# Patient Record
Sex: Male | Born: 1945 | Race: Black or African American | Hispanic: No | Smoking: Former smoker
Health system: Southern US, Community
[De-identification: ages and names within clinical notes are randomized; demographics above are authoritative.]

## PROBLEM LIST (undated history)

## (undated) DIAGNOSIS — R932 Abnormal findings on diagnostic imaging of liver and biliary tract: Secondary | ICD-10-CM

## (undated) DIAGNOSIS — S14125A Central cord syndrome at C5 level of cervical spinal cord, initial encounter: Secondary | ICD-10-CM

## (undated) DIAGNOSIS — R945 Abnormal results of liver function studies: Secondary | ICD-10-CM

## (undated) DIAGNOSIS — I2510 Atherosclerotic heart disease of native coronary artery without angina pectoris: Secondary | ICD-10-CM

## (undated) DIAGNOSIS — I7121 Aneurysm of the ascending aorta, without rupture: Secondary | ICD-10-CM

## (undated) DIAGNOSIS — I499 Cardiac arrhythmia, unspecified: Secondary | ICD-10-CM

## (undated) DIAGNOSIS — N138 Other obstructive and reflux uropathy: Secondary | ICD-10-CM

## (undated) DIAGNOSIS — I77810 Thoracic aortic ectasia: Secondary | ICD-10-CM

## (undated) DIAGNOSIS — I712 Thoracic aortic aneurysm, without rupture: Secondary | ICD-10-CM

## (undated) DIAGNOSIS — I872 Venous insufficiency (chronic) (peripheral): Secondary | ICD-10-CM

## (undated) DIAGNOSIS — N2889 Other specified disorders of kidney and ureter: Secondary | ICD-10-CM

## (undated) DIAGNOSIS — E785 Hyperlipidemia, unspecified: Secondary | ICD-10-CM

## (undated) DIAGNOSIS — I119 Hypertensive heart disease without heart failure: Secondary | ICD-10-CM

## (undated) DIAGNOSIS — N401 Enlarged prostate with lower urinary tract symptoms: Secondary | ICD-10-CM

## (undated) DIAGNOSIS — J189 Pneumonia, unspecified organism: Secondary | ICD-10-CM

## (undated) DIAGNOSIS — J45909 Unspecified asthma, uncomplicated: Secondary | ICD-10-CM

## (undated) DIAGNOSIS — I351 Nonrheumatic aortic (valve) insufficiency: Secondary | ICD-10-CM

## (undated) DIAGNOSIS — R001 Bradycardia, unspecified: Secondary | ICD-10-CM

## (undated) DIAGNOSIS — I739 Peripheral vascular disease, unspecified: Secondary | ICD-10-CM

## (undated) DIAGNOSIS — I10 Essential (primary) hypertension: Secondary | ICD-10-CM

## (undated) DIAGNOSIS — F419 Anxiety disorder, unspecified: Secondary | ICD-10-CM

## (undated) DIAGNOSIS — R7989 Other specified abnormal findings of blood chemistry: Secondary | ICD-10-CM

## (undated) DIAGNOSIS — J449 Chronic obstructive pulmonary disease, unspecified: Secondary | ICD-10-CM

## (undated) DIAGNOSIS — D649 Anemia, unspecified: Secondary | ICD-10-CM

## (undated) DIAGNOSIS — G4730 Sleep apnea, unspecified: Secondary | ICD-10-CM

## (undated) DIAGNOSIS — M4802 Spinal stenosis, cervical region: Secondary | ICD-10-CM

## (undated) DIAGNOSIS — C9000 Multiple myeloma not having achieved remission: Secondary | ICD-10-CM

## (undated) DIAGNOSIS — C61 Malignant neoplasm of prostate: Secondary | ICD-10-CM

## (undated) DIAGNOSIS — N183 Chronic kidney disease, stage 3 unspecified: Secondary | ICD-10-CM

## (undated) DIAGNOSIS — C189 Malignant neoplasm of colon, unspecified: Secondary | ICD-10-CM

## (undated) DIAGNOSIS — I5032 Chronic diastolic (congestive) heart failure: Secondary | ICD-10-CM

## (undated) DIAGNOSIS — I480 Paroxysmal atrial fibrillation: Secondary | ICD-10-CM

## (undated) DIAGNOSIS — Z87898 Personal history of other specified conditions: Secondary | ICD-10-CM

## (undated) DIAGNOSIS — E119 Type 2 diabetes mellitus without complications: Secondary | ICD-10-CM

## (undated) DIAGNOSIS — C649 Malignant neoplasm of unspecified kidney, except renal pelvis: Secondary | ICD-10-CM

## (undated) HISTORY — DX: Malignant neoplasm of unspecified kidney, except renal pelvis: C64.9

## (undated) HISTORY — DX: Bradycardia, unspecified: R00.1

## (undated) HISTORY — DX: Nonrheumatic aortic (valve) insufficiency: I35.1

## (undated) HISTORY — DX: Chronic diastolic (congestive) heart failure: I50.32

## (undated) HISTORY — DX: Other specified abnormal findings of blood chemistry: R79.89

## (undated) HISTORY — DX: Other obstructive and reflux uropathy: N40.1

## (undated) HISTORY — PX: DIAGNOSTIC LAPAROSCOPY: SUR761

## (undated) HISTORY — DX: Benign prostatic hyperplasia with lower urinary tract symptoms: N13.8

## (undated) HISTORY — DX: Hyperlipidemia, unspecified: E78.5

## (undated) HISTORY — DX: Chronic obstructive pulmonary disease, unspecified: J44.9

## (undated) HISTORY — DX: Essential (primary) hypertension: I10

## (undated) HISTORY — DX: Abnormal results of liver function studies: R94.5

## (undated) HISTORY — DX: Type 2 diabetes mellitus without complications: E11.9

## (undated) HISTORY — DX: Hypertensive heart disease without heart failure: I11.9

## (undated) HISTORY — PX: BACK SURGERY: SHX140

---

## 1972-09-02 HISTORY — PX: LUMBAR DISC SURGERY: SHX700

## 1998-01-25 ENCOUNTER — Inpatient Hospital Stay (HOSPITAL_COMMUNITY): Admission: EM | Admit: 1998-01-25 | Discharge: 1998-01-27 | Payer: Self-pay | Admitting: Emergency Medicine

## 1998-06-15 ENCOUNTER — Encounter: Admission: RE | Admit: 1998-06-15 | Discharge: 1998-06-15 | Payer: Self-pay | Admitting: *Deleted

## 1998-07-21 ENCOUNTER — Encounter: Payer: Self-pay | Admitting: Family Medicine

## 1998-07-21 ENCOUNTER — Ambulatory Visit (HOSPITAL_COMMUNITY): Admission: RE | Admit: 1998-07-21 | Discharge: 1998-07-21 | Payer: Self-pay | Admitting: Family Medicine

## 1998-08-07 ENCOUNTER — Encounter: Admission: RE | Admit: 1998-08-07 | Discharge: 1998-08-07 | Payer: Self-pay | Admitting: Family Medicine

## 1998-08-11 ENCOUNTER — Encounter: Payer: Self-pay | Admitting: Emergency Medicine

## 1998-08-11 ENCOUNTER — Emergency Department (HOSPITAL_COMMUNITY): Admission: EM | Admit: 1998-08-11 | Discharge: 1998-08-11 | Payer: Self-pay | Admitting: Emergency Medicine

## 1998-11-23 ENCOUNTER — Encounter: Admission: RE | Admit: 1998-11-23 | Discharge: 1998-11-23 | Payer: Self-pay | Admitting: Family Medicine

## 1999-08-04 ENCOUNTER — Emergency Department (HOSPITAL_COMMUNITY): Admission: EM | Admit: 1999-08-04 | Discharge: 1999-08-04 | Payer: Self-pay | Admitting: *Deleted

## 2000-09-23 ENCOUNTER — Ambulatory Visit (HOSPITAL_COMMUNITY): Admission: RE | Admit: 2000-09-23 | Discharge: 2000-09-23 | Payer: Self-pay | Admitting: Family Medicine

## 2001-06-21 ENCOUNTER — Inpatient Hospital Stay (HOSPITAL_COMMUNITY): Admission: EM | Admit: 2001-06-21 | Discharge: 2001-06-24 | Payer: Self-pay | Admitting: Emergency Medicine

## 2001-06-21 ENCOUNTER — Encounter: Payer: Self-pay | Admitting: Emergency Medicine

## 2002-05-31 ENCOUNTER — Encounter: Admission: RE | Admit: 2002-05-31 | Discharge: 2002-05-31 | Payer: Self-pay | Admitting: Cardiology

## 2002-05-31 ENCOUNTER — Encounter: Payer: Self-pay | Admitting: Cardiology

## 2002-06-14 ENCOUNTER — Ambulatory Visit (HOSPITAL_COMMUNITY): Admission: RE | Admit: 2002-06-14 | Discharge: 2002-06-14 | Payer: Self-pay | Admitting: Cardiology

## 2002-06-21 ENCOUNTER — Ambulatory Visit (HOSPITAL_COMMUNITY): Admission: RE | Admit: 2002-06-21 | Discharge: 2002-06-21 | Payer: Self-pay | Admitting: Cardiology

## 2002-06-21 ENCOUNTER — Encounter: Payer: Self-pay | Admitting: Cardiology

## 2002-12-24 ENCOUNTER — Encounter: Payer: Self-pay | Admitting: Cardiology

## 2002-12-24 ENCOUNTER — Encounter: Admission: RE | Admit: 2002-12-24 | Discharge: 2002-12-24 | Payer: Self-pay | Admitting: Cardiology

## 2004-05-26 ENCOUNTER — Emergency Department (HOSPITAL_COMMUNITY): Admission: EM | Admit: 2004-05-26 | Discharge: 2004-05-26 | Payer: Self-pay | Admitting: Emergency Medicine

## 2004-08-25 ENCOUNTER — Emergency Department (HOSPITAL_COMMUNITY): Admission: EM | Admit: 2004-08-25 | Discharge: 2004-08-26 | Payer: Self-pay

## 2005-07-05 ENCOUNTER — Emergency Department (HOSPITAL_COMMUNITY): Admission: EM | Admit: 2005-07-05 | Discharge: 2005-07-05 | Payer: Self-pay | Admitting: Emergency Medicine

## 2005-07-09 ENCOUNTER — Ambulatory Visit (HOSPITAL_COMMUNITY): Admission: RE | Admit: 2005-07-09 | Discharge: 2005-07-09 | Payer: Self-pay | Admitting: Internal Medicine

## 2005-07-12 ENCOUNTER — Emergency Department (HOSPITAL_COMMUNITY): Admission: EM | Admit: 2005-07-12 | Discharge: 2005-07-12 | Payer: Self-pay | Admitting: Emergency Medicine

## 2007-08-11 ENCOUNTER — Ambulatory Visit (HOSPITAL_COMMUNITY): Admission: RE | Admit: 2007-08-11 | Discharge: 2007-08-11 | Payer: Self-pay | Admitting: Internal Medicine

## 2007-09-08 ENCOUNTER — Emergency Department (HOSPITAL_COMMUNITY): Admission: EM | Admit: 2007-09-08 | Discharge: 2007-09-08 | Payer: Self-pay | Admitting: Emergency Medicine

## 2007-11-12 ENCOUNTER — Encounter: Admission: RE | Admit: 2007-11-12 | Discharge: 2007-11-12 | Payer: Self-pay | Admitting: Orthopedic Surgery

## 2007-12-03 ENCOUNTER — Encounter (INDEPENDENT_AMBULATORY_CARE_PROVIDER_SITE_OTHER): Payer: Self-pay | Admitting: Internal Medicine

## 2008-02-16 ENCOUNTER — Ambulatory Visit (HOSPITAL_COMMUNITY): Admission: RE | Admit: 2008-02-16 | Discharge: 2008-02-16 | Payer: Self-pay | Admitting: Family Medicine

## 2008-02-16 ENCOUNTER — Ambulatory Visit: Payer: Self-pay | Admitting: Vascular Surgery

## 2008-02-16 ENCOUNTER — Encounter (INDEPENDENT_AMBULATORY_CARE_PROVIDER_SITE_OTHER): Payer: Self-pay | Admitting: Family Medicine

## 2008-11-25 ENCOUNTER — Encounter: Admission: RE | Admit: 2008-11-25 | Discharge: 2008-11-25 | Payer: Self-pay | Admitting: Family Medicine

## 2009-04-20 ENCOUNTER — Encounter: Payer: Self-pay | Admitting: Gastroenterology

## 2009-11-02 ENCOUNTER — Encounter: Admission: RE | Admit: 2009-11-02 | Discharge: 2009-11-02 | Payer: Self-pay | Admitting: Family Medicine

## 2009-11-10 DIAGNOSIS — L308 Other specified dermatitis: Secondary | ICD-10-CM | POA: Insufficient documentation

## 2009-11-10 DIAGNOSIS — L299 Pruritus, unspecified: Secondary | ICD-10-CM | POA: Insufficient documentation

## 2010-01-30 ENCOUNTER — Encounter: Admission: RE | Admit: 2010-01-30 | Discharge: 2010-01-30 | Payer: Self-pay | Admitting: Family Medicine

## 2010-04-04 DIAGNOSIS — F411 Generalized anxiety disorder: Secondary | ICD-10-CM | POA: Insufficient documentation

## 2010-04-04 DIAGNOSIS — N2889 Other specified disorders of kidney and ureter: Secondary | ICD-10-CM

## 2010-04-04 HISTORY — DX: Other specified disorders of kidney and ureter: N28.89

## 2010-09-07 DIAGNOSIS — I10 Essential (primary) hypertension: Secondary | ICD-10-CM | POA: Insufficient documentation

## 2010-09-07 DIAGNOSIS — R7309 Other abnormal glucose: Secondary | ICD-10-CM | POA: Insufficient documentation

## 2010-09-09 DIAGNOSIS — E785 Hyperlipidemia, unspecified: Secondary | ICD-10-CM | POA: Insufficient documentation

## 2010-10-03 ENCOUNTER — Telehealth (INDEPENDENT_AMBULATORY_CARE_PROVIDER_SITE_OTHER): Payer: Self-pay | Admitting: Radiology

## 2010-10-03 ENCOUNTER — Other Ambulatory Visit: Payer: Self-pay

## 2010-10-04 ENCOUNTER — Other Ambulatory Visit (HOSPITAL_COMMUNITY): Payer: Self-pay

## 2010-10-04 ENCOUNTER — Telehealth (INDEPENDENT_AMBULATORY_CARE_PROVIDER_SITE_OTHER): Payer: Self-pay | Admitting: *Deleted

## 2010-10-09 ENCOUNTER — Other Ambulatory Visit (HOSPITAL_COMMUNITY): Payer: Self-pay

## 2010-10-10 NOTE — Progress Notes (Signed)
Summary: N/S stress test  Phone Note Outgoing Call Call back at [phone]   Call placed by: Eliezer Lofts, EMT-P,  October 04, 2010 2:03 PM Summary of Call: Patient NS for stress test appt. today. Unable to leave messsage due to 'mailbox is full'.  Home number listed is INCORRECT, person that answered advised there is no-one by that name. Eliezer Lofts, EMT-P  October 04, 2010 2:05 PM

## 2010-10-10 NOTE — Progress Notes (Signed)
Summary: Nuc Pre-Procedure  Phone Note Outgoing Call Call back at Home Phone [phone]   Call placed by: Anthony Sar Call placed to: Patient Reason for Call: Confirm/change Appt Summary of Call: Left message with information on My[employer] Information Sheet (see scanned document for details).      Nuclear Med Background Indications for Stress Test: Evaluation for Ischemia   History: COPD, Myocardial Perfusion Study  History Comments: 2003- Normal MPS.EF= 54%  Symptoms: Syncope    Nuclear Pre-Procedure Cardiac Risk Factors: Family History - CAD, Hypertension, Lipids, NIDDM

## 2010-10-18 ENCOUNTER — Ambulatory Visit (HOSPITAL_COMMUNITY): Payer: Medicaid Other | Attending: Cardiovascular Disease

## 2010-10-18 DIAGNOSIS — I079 Rheumatic tricuspid valve disease, unspecified: Secondary | ICD-10-CM | POA: Insufficient documentation

## 2010-10-18 DIAGNOSIS — E785 Hyperlipidemia, unspecified: Secondary | ICD-10-CM | POA: Insufficient documentation

## 2010-10-18 DIAGNOSIS — Z8249 Family history of ischemic heart disease and other diseases of the circulatory system: Secondary | ICD-10-CM | POA: Insufficient documentation

## 2010-10-18 DIAGNOSIS — I10 Essential (primary) hypertension: Secondary | ICD-10-CM | POA: Insufficient documentation

## 2010-10-18 DIAGNOSIS — I080 Rheumatic disorders of both mitral and aortic valves: Secondary | ICD-10-CM | POA: Insufficient documentation

## 2010-10-18 DIAGNOSIS — J449 Chronic obstructive pulmonary disease, unspecified: Secondary | ICD-10-CM | POA: Insufficient documentation

## 2010-10-18 DIAGNOSIS — R55 Syncope and collapse: Secondary | ICD-10-CM

## 2010-10-18 DIAGNOSIS — E119 Type 2 diabetes mellitus without complications: Secondary | ICD-10-CM | POA: Insufficient documentation

## 2010-10-18 DIAGNOSIS — J4489 Other specified chronic obstructive pulmonary disease: Secondary | ICD-10-CM | POA: Insufficient documentation

## 2010-10-29 ENCOUNTER — Telehealth (INDEPENDENT_AMBULATORY_CARE_PROVIDER_SITE_OTHER): Payer: Self-pay | Admitting: Radiology

## 2010-10-30 ENCOUNTER — Ambulatory Visit (HOSPITAL_COMMUNITY): Payer: Medicare Other

## 2010-10-30 DIAGNOSIS — R0989 Other specified symptoms and signs involving the circulatory and respiratory systems: Secondary | ICD-10-CM

## 2010-11-01 ENCOUNTER — Other Ambulatory Visit (HOSPITAL_COMMUNITY): Payer: Medicare Other

## 2010-11-06 ENCOUNTER — Encounter (INDEPENDENT_AMBULATORY_CARE_PROVIDER_SITE_OTHER): Payer: Self-pay | Admitting: *Deleted

## 2010-11-06 ENCOUNTER — Encounter: Payer: Self-pay | Admitting: Cardiology

## 2010-11-06 ENCOUNTER — Encounter: Payer: Self-pay | Admitting: *Deleted

## 2010-11-06 ENCOUNTER — Ambulatory Visit (HOSPITAL_COMMUNITY): Payer: Medicare Other | Attending: Cardiology

## 2010-11-06 DIAGNOSIS — R55 Syncope and collapse: Secondary | ICD-10-CM | POA: Insufficient documentation

## 2010-11-06 DIAGNOSIS — I4949 Other premature depolarization: Secondary | ICD-10-CM

## 2010-11-06 DIAGNOSIS — R0989 Other specified symptoms and signs involving the circulatory and respiratory systems: Secondary | ICD-10-CM

## 2010-11-06 DIAGNOSIS — R079 Chest pain, unspecified: Secondary | ICD-10-CM

## 2010-11-06 DIAGNOSIS — R9431 Abnormal electrocardiogram [ECG] [EKG]: Secondary | ICD-10-CM

## 2010-11-08 NOTE — Progress Notes (Signed)
Summary: Nuclear Pre-Procedure  Phone Note Outgoing Call Call back at [facility] Phone [phone]   Call placed by: Eliezer Lofts, EMT-P,  October 29, 2010 11:22 AM Call placed to: Patient Action Taken: Phone Call Completed Reason for Call: Confirm/change Appt Summary of Call: Left message with information on Myov[employer] Information Sheet (see scanned document for details). Eliezer Lofts, EMT-P  October 29, 2010 11:22 AM      Nuclear Med Background Indications for Stress Test: Evaluation for Ischemia   History: COPD, Myocardial Perfusion Study  History Comments: 2003- Normal MPS.EF= 54%  Symptoms: Syncope    Nuclear Pre-Procedure Cardiac Risk Factors: Family History - CAD, Hypertension, Lipids, NIDDM

## 2010-11-13 NOTE — Assessment & Plan Note (Signed)
Summary: Cardiology Nuclear Testing  Nuclear Med Background Indications for Stress Test: Evaluation for Ischemia   History: COPD, Myocardial Perfusion Study  History Comments: 2003- Normal MPS.EF= 54%  Symptoms: Syncope    Nuclear Pre-Procedure Cardiac Risk Factors: Family History - CAD, Hypertension, Lipids, NIDDM Caffeine/Decaff Intake: None NPO After: 4:00 AM Lungs: Clear IV 0.9% NS with Angio Cath: 20g     IV Site: R Antecubital IV Started by: Irven Baltimore, RN Chest Size (in) 44     Height (in): 69.5 Weight (lb): 236 BMI: 34.48  Nuclear Med Study 1 or 2 day study:  2 day     Stress Test Type:  Lexiscan Reading MD:  Darlin Coco, MD     Referring MD:  Mamie Nick Nahser Resting Radionuclide:  Technetium 71m Tetrofosmin     Resting Radionuclide Dose:  11 mCi  Stress Radionuclide:  Technetium 55m Tetrofosmin     Stress Radionuclide Dose:  33 mCi   Stress Protocol      Max HR:  77 bpm Max Systolic BP: Q000111Q mm HgRate Pressure Product:  [id number]  Lexiscan: 0.4 mg   Stress Test Technologist:  Irven Baltimore,  RN     Nuclear Technologist:  Charlton Amor, CNMT  Rest Procedure  Myocardial perfusion imaging was performed at rest 45 minutes following the intravenous administration of Technetium 56m Tetrofosmin.  Stress Procedure  The patient received IV Lexiscan 0.4 mg over 15-seconds.  Technetium 55m Tetrofosmin injected at 30-seconds.  The EKG was nondiagnostic due to baseline T-wave changes, frequent fusion beats, PVC's, PAC's, and > 1 second pauses.  Quantitative spect images were obtained after a 45 minute delay.  QPS Raw Data Images:  Normal; no motion artifact; normal heart/lung ratio. Stress Images:  Normal homogeneous uptake in all areas of the myocardium. Rest Images:  Normal homogeneous uptake in all areas of the myocardium. Subtraction (SDS):  No evidence of ischemia. Transient Ischemic Dilatation:  1.09  (Normal <1.22)  Lung/Heart Ratio:  .29  (Normal  <0.45)  Quantitative Gated Spect Images QGS EDV:  215 ml QGS ESV:  111 ml QGS EF:  48 % QGS cine images:  Enlarge LV cavity.  No segmental wall motion abnormalities  Findings Low risk nuclear study   Evidence for LV Dysfunction LV Dysfunction    Overall Impression  Exercise Capacity: Lexiscan with no exercise. BP Response: Normal blood pressure response. Clinical Symptoms: No chest pain ECG Impression: Baseline T wave inversion unchanged with stress. Overall Impression: Low risk stress nuclear study. Overall Impression Comments: No reversible ischemia seen.  Appended Document: Cardiology Nuclear Testing copy sent to Dr. Acie Fredrickson

## 2011-01-18 NOTE — Discharge Summary (Signed)
[place]  Patient:    [name], [name] Visit Number: [id number] MRN: [id number]          Service Type: MED Location: [id number] Attending Physician:  [name] [name] Dictated by:   [name] [name], MS4 Admit Date:  06/21/2001 Discharge Date: 06/24/2001   CC:         [name] [name], M.D.   Discharge Summary  DATE OF BIRTH:  02/20/1946  DISCHARGE DIAGNOSES: 1. Hypertension, poor control. 2. Congestive heart failure, ejection fraction 40 to 50%. 3. Mild left ventricular hypertrophy. 4. Moderate aortic regurgitation. 5. Elevated CK-MB. 6. Hypokalemia. 7. Sleep apnea. 8. Benign prostatic hypertrophy. 9. Normocytic anemia.  DISCHARGE MEDICATIONS: 1. Norvasc 10 mg p.o. q.d. 2. Lisinopril 10 mg p.o. q.d. 3. Hydrochlorothiazide 25 mg p.o. q.a.m. 4. Amitriptyline 25 mg p.o. q.h.s. 5. ASA 81 mg p.o. q.d. 6. Albuterol inhaler 2 puffs q.i.d. p.r.n.  DISPOSITION:  Stable.  FOLLOWUP: 1. The patient was to follow up with [facility] on June 09, 2001, at 8    a.m., at which time he is to [employer] recertification to help financially    support his medications. 2. Return to [name] on July 07, 2001, at 2:45 p.m. 3. Dr. Rollene [name] in one month.  CONSULTATIONS:  Dr. Rollene [name].  PROCEDURES PERFORMED: 1. Cardiac echocardiogram showing ejection fraction 40 to 50%, no wall motion    abnormalities, moderate AR, mild LA dilatation, moderate concentric left    ventricular hypertrophy.  Echocardiogram consistent with hypertensive heart    disease. 2. Chest x-ray on June 21, 2001, showed mild pulmonary vascular congestion    and cardiomegaly.  HISTORY OF PRESENT ILLNESS:  The patient is a 65 year old African-American male with a history of hypertension, comes to ED with elevated blood pressure to 207/130 with a pulse of 50, complains of blurry vision and episodes of dizziness, no headaches.  Says he ran out of blood pressure  medications.  ADMISSION LABORATORY DATA:  Sodium 137, potassium 2.9, chloride 104, bicarbonate 29, BUN 11, creatinine 0.9, glucose 116.  White blood cell count 7.3, hemoglobin 12.8, platelets 259.  PT 13, PTT 29.  Magnesium 1.9.  CK 872, CK-MB 32.9, troponin-I 0.01.  Urinalysis negative.  Chest x-ray see above. EKG showed sinus bradycardia, nonspecific ST-T changes, no acute changes.  HOSPITAL COURSE:  #1 - HYPERTENSION:  The patient has a chronic history of hypertension, and ran out of his klonodine 1-1/2 months ago.  This was due to financial constraint. He had been taking his wifes leftover medication since then sporadically. The patient has missed several [facility] appointments for unclear reasons, but he was rescheduled for these appointments, at which time he will be able to obtain his new regimen of antihypertensive medications at low cost.  He was begun on Norvasc, hydrochlorothiazide, and lisinopril in substitution for his klonodine.  He will be followed up in 1 to 2 weeks to determine the ethicacy of these medications, and check for any side effects.  It is also believed that his recent increase in hypertension contributed to his shortness of breath.  See next problem.  #2 -  CONGESTIVE HEART FAILURE:  The patient also was admitted complaining of increased shortness of breath, three pillow orthopnea for about 1 to 2 months, with increased right leg edema and sporadic episodes of heart palpitations and skipped beats.  He denied any chest pain or chest tightness, so he was diuresed aggressively upon admission with Lasix, and his  shortness of breath quickly resolved, as did his pedal edema.  An echocardiogram was obtained showing an ejection fraction of 40 to 50%.  The patient was started on a more optimal antihypertensive medication regimen which should prove beneficial for his condition.  Of note, due to his left ventricular hypertrophy and congestive heart failure, an ACE  inhibitor was started and should be continued due to its beneficial affects on mortality in this population.  #3 - ELEVATED CK-MB:  The patients CK-MB fraction was elevated, while the troponin remained negative throughout his stay.  It was also coupled with a highly elevated CK to around 900.  The patient had no evidence of any mucitis or rhabdomyolysis, and the reason for his elevated enzymes were believed to be due to a hypokalemic myopathy.  These numbers were followed throughout his course, and steadily decreased from initial high of CK 872 to 471 on discharge.  The CK-MB from a high of 32.9 to 15.2 on discharge.  The troponins remained at 0.01 throughout.  #4 - HYPOKALEMIA:  The patient presented with a 2.5 potassium level.  This was replaced throughout his course, and remained stable.  On discharge he had a potassium of 3.8.  #5 - SLEEP APNEA:  The patient complained of a history of sleep apnea confirmed with a sleep study.  No documentation for this was found in the past, and the patient does not currently use a CPAP machine.  This is likely contributing to his hypertension and/or pulmonary hypertension.  The patient was advised to follow up with his primary care physician who initially worked with him on this problem, and to see if he can obtain a CPAP nasal ______ at some point in the future.  #6 - BENIGN PROSTATIC HYPERTROPHY:  The patient described a history consistent with benign prostatic hypertrophy, including difficulty starting and stopping urination, dribbling, post micturition.  He had no symptoms of dysuria, but also complained of urgency and frequency.  His urinalysis was negative.  A PSA was checked to rule out prostate cancer, and this was negative as well.  On  physical examination, his prostate did seem enlarged, but was nontender.  Due to the number of medications the patient is already taken, and the expense of these, a herbal product, saw palmetto, which has  been shown to be effecatious in improving urine stream was recommended.  Follow up on this condition in the future.  He may need to begin medication such as Flomax if the saw palmetto is not effective.  #7 - NORMOCYTIC ANEMIA:  The hemoglobin and hematocrit was followed and found to be stable throughout.  This particular problem was not investigated thoroughly at this time.  The patient should follow up on this condition in the future.  He did, however, have heme negative stool on admission, however, it would be reasonable to Hemoccult followup stools and make sure the patient is properly screened for colon cancer in the future.  #8 - HYPERLIPIDEMIA:  The patient was found to have elevated LDL, as well as several other risk factors for coronary artery disease.  It would be advisable for the patient to begin a 3 to 6 month course of exercise and diet, and a followup LDL taken.  If this is not improved at that time, please consider statin drug with LFPs.  DISCHARGE LABORATORY DATA:  Sodium 139, potassium 3.8, chloride 103, bicarbonate 27, BUN 10, creatinine 1.2, glucose 92, hemoglobin 13.5, hematocrit 40.  White blood cell count 7.9, platelets  251.  PSA 2.50.  LDL 147, HDL 49, triglycerides 105.  BMP 43.7.  TSH 3.34, T3 uptake 40, free T4 1.14. Dictated by:   [name] [name], MS4 Attending Physician:  [name] [name] DD:  06/25/01 TD:  06/29/01 Job: [id number]

## 2011-01-18 NOTE — Procedures (Signed)
EEG NUMBER:  [id number]   INDICATION FOR STUDY:  The patient's EEG was requested by Dr. Nolene Ebbs,  who evaluated this 65 year old male for a possible seizure disorder.  The  patient had several episodes of loss of consciousness associated with tongue  bites.  He also has a diagnosis of sleep apnea.   TECHNICAL DESCRIPTION:  The patient was asked to perform photic stimulation  maneuvers.  Hyperventilation was deferred due to a history of cardiovascular  disease.  A medication list is not added to the request.  No H&P is  available.   A posterior dominant rhythm was seen over both posterior hemispheres at a  frequency of 9 Hz.  The study is so much marred by motion artifact that it  is almost uninterpretable.  There is evidence of EKG artifact, electrode  popping and disconnection as well as motion artifact.  Sleep is seen in the  form of sleep spindles associated with vertex sharp waves over the frontal,  parietal and anterior region and alternates with a drowsiness pattern, which  are characterized by theta range frequencies.  Also, I cannot detect any  epileptiform discharges for sure; it would be worth to repeat the study when  the patient is more cooperative and prepped to undergo an EEG.  To allow  optimal electrode placement, the skull has to be cleaned from any oily or  fatty residue and the patient should be placed in a cool space so that sweat  artifact does not hinder the electrode adherence.  Please consider a  repetition of the study to allow a better interpretation.   CONCLUSION:  No definite abnormality can be seen, but I cannot rule out that  abnormalities took place during the motion artifact prone part of the EEG;  it is therefore my recommendation to repeat the study.           ______________________________  Larey Seat, M.D.     [id number]  D:  07/09/2005 18:53:07  T:  07/10/2005 07:44:44  Job #:  [id number]   cc:   Nolene Ebbs, M.D.  Fax: [phone]

## 2011-04-11 DIAGNOSIS — B353 Tinea pedis: Secondary | ICD-10-CM | POA: Insufficient documentation

## 2011-04-11 DIAGNOSIS — J45909 Unspecified asthma, uncomplicated: Secondary | ICD-10-CM | POA: Insufficient documentation

## 2011-04-11 DIAGNOSIS — G4733 Obstructive sleep apnea (adult) (pediatric): Secondary | ICD-10-CM | POA: Insufficient documentation

## 2011-06-28 DIAGNOSIS — E876 Hypokalemia: Secondary | ICD-10-CM | POA: Insufficient documentation

## 2012-01-30 DIAGNOSIS — I878 Other specified disorders of veins: Secondary | ICD-10-CM | POA: Insufficient documentation

## 2012-02-10 ENCOUNTER — Other Ambulatory Visit: Payer: Self-pay | Admitting: Nurse Practitioner

## 2012-02-10 DIAGNOSIS — R972 Elevated prostate specific antigen [PSA]: Secondary | ICD-10-CM

## 2012-02-13 ENCOUNTER — Inpatient Hospital Stay: Admission: RE | Admit: 2012-02-13 | Payer: Medicare Other | Source: Ambulatory Visit

## 2012-02-18 ENCOUNTER — Inpatient Hospital Stay: Admission: RE | Admit: 2012-02-18 | Payer: Medicare Other | Source: Ambulatory Visit

## 2012-02-25 DIAGNOSIS — H9190 Unspecified hearing loss, unspecified ear: Secondary | ICD-10-CM | POA: Insufficient documentation

## 2012-07-23 DIAGNOSIS — I872 Venous insufficiency (chronic) (peripheral): Secondary | ICD-10-CM | POA: Insufficient documentation

## 2012-12-17 ENCOUNTER — Other Ambulatory Visit (HOSPITAL_COMMUNITY): Payer: Self-pay | Admitting: Interventional Radiology

## 2012-12-17 DIAGNOSIS — I83819 Varicose veins of unspecified lower extremities with pain: Secondary | ICD-10-CM

## 2013-01-05 ENCOUNTER — Other Ambulatory Visit: Payer: Medicare Other

## 2013-02-02 ENCOUNTER — Ambulatory Visit
Admission: RE | Admit: 2013-02-02 | Discharge: 2013-02-02 | Disposition: A | Discharge status: Home / Self Care | Payer: Medicare Other | Source: Ambulatory Visit | Attending: Interventional Radiology | Admitting: Interventional Radiology

## 2013-02-02 DIAGNOSIS — I83819 Varicose veins of unspecified lower extremities with pain: Secondary | ICD-10-CM

## 2013-04-20 ENCOUNTER — Telehealth: Payer: Self-pay | Admitting: Radiology

## 2013-04-20 ENCOUNTER — Other Ambulatory Visit (HOSPITAL_COMMUNITY): Payer: Self-pay | Admitting: Interventional Radiology

## 2013-04-20 DIAGNOSIS — I83893 Varicose veins of bilateral lower extremities with other complications: Secondary | ICD-10-CM

## 2013-04-20 NOTE — Telephone Encounter (Signed)
Left message requesting patient call for status update & to schedule 3 mo f/u Conservative Rx of Varicose Veins.  Cherylyn Sundby Riki Rusk, RN 04/20/2013 12:10 PM

## 2013-04-28 DIAGNOSIS — L0390 Cellulitis, unspecified: Secondary | ICD-10-CM | POA: Insufficient documentation

## 2013-05-12 ENCOUNTER — Other Ambulatory Visit (HOSPITAL_COMMUNITY): Payer: Self-pay | Admitting: Interventional Radiology

## 2013-05-12 DIAGNOSIS — I83893 Varicose veins of bilateral lower extremities with other complications: Secondary | ICD-10-CM

## 2013-05-13 ENCOUNTER — Inpatient Hospital Stay: Admission: RE | Admit: 2013-05-13 | Payer: Medicare Other | Source: Ambulatory Visit

## 2013-05-25 ENCOUNTER — Encounter (HOSPITAL_BASED_OUTPATIENT_CLINIC_OR_DEPARTMENT_OTHER): Payer: Medicare Other

## 2013-06-03 ENCOUNTER — Inpatient Hospital Stay: Admission: RE | Admit: 2013-06-03 | Payer: Medicare Other | Source: Ambulatory Visit

## 2013-06-22 ENCOUNTER — Other Ambulatory Visit: Payer: Medicare Other

## 2013-08-10 DIAGNOSIS — Z9114 Patient's other noncompliance with medication regimen: Secondary | ICD-10-CM | POA: Insufficient documentation

## 2013-11-23 ENCOUNTER — Emergency Department (INDEPENDENT_AMBULATORY_CARE_PROVIDER_SITE_OTHER)
Admission: EM | Admit: 2013-11-23 | Discharge: 2013-11-23 | Disposition: A | Discharge status: Home / Self Care | Payer: Medicare Other | Source: Home / Self Care | Attending: Emergency Medicine | Admitting: Emergency Medicine

## 2013-11-23 ENCOUNTER — Ambulatory Visit (HOSPITAL_COMMUNITY)
Admission: RE | Admit: 2013-11-23 | Discharge: 2013-11-23 | Disposition: A | Discharge status: Home / Self Care | Payer: Medicare Other | Source: Ambulatory Visit | Attending: Emergency Medicine | Admitting: Emergency Medicine

## 2013-11-23 ENCOUNTER — Encounter (HOSPITAL_COMMUNITY): Payer: Self-pay | Admitting: Emergency Medicine

## 2013-11-23 DIAGNOSIS — M79609 Pain in unspecified limb: Secondary | ICD-10-CM | POA: Insufficient documentation

## 2013-11-23 DIAGNOSIS — I8310 Varicose veins of unspecified lower extremity with inflammation: Secondary | ICD-10-CM

## 2013-11-23 DIAGNOSIS — I872 Venous insufficiency (chronic) (peripheral): Secondary | ICD-10-CM

## 2013-11-23 MED ORDER — TRAMADOL HCL 50 MG PO TABS: 100.0000 mg | ORAL_TABLET | Freq: Three times a day (TID) | ORAL | Status: DC | PRN

## 2013-11-23 MED ORDER — BETAMETHASONE DIPROPIONATE AUG 0.05 % EX CREA: TOPICAL_CREAM | Freq: Two times a day (BID) | CUTANEOUS | Status: DC

## 2013-11-23 MED ORDER — CEPHALEXIN 500 MG PO CAPS: 500.0000 mg | ORAL_CAPSULE | Freq: Three times a day (TID) | ORAL | Status: DC

## 2013-11-23 NOTE — ED Provider Notes (Signed)
Chief Complaint   Chief Complaint  Patient presents with  . Leg Pain    History of Present Illness   [name] [name] is a 68 year old male who has had chronic swelling of the left leg. He can feel a hard place" in his leg. It's tender to touch. It's not been draining any pus or oozing a clear fluid. The leg does itch. He describes pain from the calf down to the ankle. Also some pain and swelling over the dorsum of the foot. No pain or swelling above the knee. He's had no fever or chills, no shortness of breath or chest pain, no bowel swelling. No history of kidney or liver disease. No history of congestive heart failure or heart disease. No shortness of breath, PND, orthopnea. He does have hypertension elevated cholesterol.  Review of Systems   Other than as noted above, the patient denies any of the following symptoms: Systemic:  No fever, chills, sweats, weight gain or loss. Respiratory:  No coughing, wheezing, or shortness of breath. Cardiac:  No chest pain, tightness, pressure or syncope. GI:  No abdominal pain, swelling, distension, nausea, or vomiting. GU:  No dysuria, frequency, or hematuria. Ext:  No joint pain or muscle pain. Skin:  No rash or itching. Neuro:  No paresthesias or muscle weakness.  [place]   Past medical history, family history, social history, meds, and allergies were reviewed.  Currently takes Norvasc, Lipitor, Advair, and Klor-Con.  Physical Examination     Vital signs:  BP 133/77  Pulse 56  Temp(Src) 98.4 F (36.9 C) (Oral)  Resp 16  SpO2 100% Gen:  Alert, oriented, in no distress. Neck:  No tenderness, adenopathy, or JVD. Lungs:  Breath sounds clear and equal bilaterally.  No rales, rhonchi or wheezes. Heart:  Regular rhythm, no gallops or murmers. Abdomen:  Soft, nontender, no organomegaly or mass. Ext:  He has nonpitting edema of both legs. There are stasis changes with patches of scaling, but no ulceration. The dorsum of the foot in the entire  lower leg is tender to touch. Pulses were not felt. Neuro:  Alert and oriented times 3.  No muscle weakness.  Sensation intact to light touch. Skin:  Warm and dry.  No rash or skin lesions.  Course in Urgent [place]   The patient was sent to the noninvasive vascular lab for a venous duplex. This was negative for DVT.  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  He has venous stasis, lymphedema, and stasis dermatitis. He was instructed to elevate the leg, keep it wrapped, avoid scratching, and applying a moisturizing cream.  Plan   1.  Meds:  The following meds were prescribed:   Discharge Medication List as of 11/23/2013  2:47 PM    START taking these medications   Details  augmented betamethasone dipropionate (DIPROLENE AF) 0.05 % cream Apply topically 2 (two) times daily., Starting 11/23/2013, Until Discontinued, Normal    cephALEXin (KEFLEX) 500 MG capsule Take 1 capsule (500 mg total) by mouth 3 (three) times daily., Starting 11/23/2013, Until Discontinued, Normal    traMADol (ULTRAM) 50 MG tablet Take 2 tablets (100 mg total) by mouth every 8 (eight) hours as needed., Starting 11/23/2013, Until Discontinued, Normal        2.  Patient Education/Counseling:  The patient was given appropriate handouts, self care instructions, and instructed in symptomatic relief.  Followup with his primary care physician within the next 2 weeks.  3.  Follow up:  The  patient was told to follow up here if no better in 3 to 4 days, or sooner if becoming worse in any way, and given some red flag symptoms such as worsening swelling, chest pain, shortness of breath or fever which would prompt immediate return.       [name] Mo, MD 11/23/13 [phone]

## 2013-11-23 NOTE — Discharge Instructions (Signed)
Stasis Dermatitis Stasis dermatitis occurs when veins lose the ability to pump blood back to the heart (poor venous circulation). It causes a reddish-purple to brownish scaly, itchy rash on the legs. The rash comes from pooling of blood (stasis). CAUSES  This occurs because the veins do not work very well anymore or because pressure may be increased in the veins due to other conditions. With blood pooling, the increased pressure in the tiny blood vessels (capillaries) causes fluid to leak out of the capillaries into the tissue. The extra fluid makes it harder for the blood to feed the cells and get rid of waste products. SYMPTOMS  Stasis dermatitis appears as red, scaly, itchy patches on the legs. A yellowish or light brown discoloration is also present. Due to scratching or other injury, these patches can become an ulcer. This ulcer may remain for long periods of time. The ulcer can also become infected. Swelling of the legs is often present with stasis dermatitis. If the leg is swollen, this increases the risk of infection and further damage to the skin. Sometimes, intense itching, tingling, and burning occurs before signs of stasis dermatitis appear. You may find yourself scratching the insides of your ankles or rubbing your ankles together before the rash appears. After healing, there are often brown spots on the affected skin. DIAGNOSIS  Your caregiver makes this diagnosis based on an exam. Other tests may be done to better understand the cause. TREATMENT  If underlying conditions are present, they must be treated. Some of these conditions are heart failure, thyroid problems, poor nutrition, and varicose veins.  Cortisone creams and ointments applied to the skin (topically) may be needed, as well as medicine to reduce swelling in the legs (diuretics).  Compression stockings or an elastic wrap may also be needed to reduce swelling.  If there is an infection, antibiotic medicines may also be  used. HOME CARE INSTRUCTIONS   Try to rest and raise (elevate) the affected leg above the level of the heart, if possible.  Burow's solution wet packs applied for 30 minutes, 3 times daily, will help the weepy rash. Stop using the packs before your skin gets too dry. You can also use a mixture of 3 parts white vinegar to 1 quart water.  Grease your legs daily with ointments, such as petroleum jelly, to fight dryness.  Avoid scratching or injuring the area. SEEK IMMEDIATE MEDICAL CARE IF:   Your rash gets worse.  An ulcer forms.  You have an oral temperature above 102 F (38.9 C), not controlled by medicine.  You have any other severe symptoms. Document Released: 11/28/2005 Document Revised: 11/11/2011 Document Reviewed: 01/08/2010 [facility] Patient Information 2014 [place], [place].

## 2013-11-23 NOTE — Progress Notes (Signed)
Left lower extremity venous duplex completed.  Left:  No evidence of DVT, superficial thrombosis, or Baker's cyst.  Right:  Negative for DVT in the common femoral vein.  

## 2013-11-23 NOTE — ED Notes (Signed)
Patient complains of left leg pain x 2 months; States swelling and burning sensation with a hard place.

## 2014-04-05 DIAGNOSIS — R739 Hyperglycemia, unspecified: Secondary | ICD-10-CM | POA: Insufficient documentation

## 2014-04-07 ENCOUNTER — Other Ambulatory Visit: Payer: Self-pay | Admitting: Nurse Practitioner

## 2014-04-07 DIAGNOSIS — I878 Other specified disorders of veins: Secondary | ICD-10-CM

## 2014-04-11 ENCOUNTER — Encounter: Payer: Self-pay | Admitting: Radiology

## 2014-06-13 ENCOUNTER — Telehealth: Payer: Self-pay | Admitting: Cardiology

## 2014-06-13 NOTE — Telephone Encounter (Signed)
Received 17 pages of records from [facility] for new patient appointment with Dr Percival Spanish for 07/11/14  lp

## 2014-06-21 ENCOUNTER — Telehealth: Payer: Self-pay | Admitting: Cardiology

## 2014-06-21 NOTE — Telephone Encounter (Signed)
Attempted to call pt, phone number listed was not working

## 2014-06-21 NOTE — Telephone Encounter (Signed)
Pt called in stating that he is having some numbness in his fingers, and rapid heart beats and would like to know if could get in to be seen sooner. Please call  Thanks

## 2014-07-11 ENCOUNTER — Ambulatory Visit (INDEPENDENT_AMBULATORY_CARE_PROVIDER_SITE_OTHER): Payer: Medicare Other | Admitting: Cardiology

## 2014-07-11 ENCOUNTER — Encounter: Payer: Self-pay | Admitting: Cardiology

## 2014-07-11 VITALS — BP 138/84 | HR 63 | Ht 69.5 in | Wt 241.5 lb

## 2014-07-11 DIAGNOSIS — R002 Palpitations: Secondary | ICD-10-CM

## 2014-07-11 LAB — TSH: TSH: 1.518 u[IU]/mL (ref 0.350–4.500)

## 2014-07-11 NOTE — Progress Notes (Signed)
HPI The patient presents for evaluation of various complaints including palpitations.  He says he's had these for years. However, I see a telephone message but says these were increased. However, he's not really able to quantify or qualify these very much. He describes sensations of his heart beating hard. He denies however any sustained rapid rate. He does say it skips sometimes. There may be syncope but this is somewhat vague. He says he's had episodes where he is eating and then he seemed to lose consciousness though he doesn't fall to the floor. He describes at least one episode of being on his knees praying and perhaps passing out. However, this seems to be infrequent and he cannot provide significant details. He sleeps chronically on 3 pillows but he doesn't describe any new shortness of breath, PND or orthopnea. He doesn't have any new chest pressure, neck or arm discomfort. 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  No Known Allergies  Current Outpatient Prescriptions  Medication Sig Dispense Refill  . amLODipine (NORVASC) 10 MG tablet Take 10 mg by mouth 2 (two) times daily.     [name] augmented betamethasone dipropionate (DIPROLENE AF) 0.05 % cream Apply topically 2 (two) times daily. 50 g 5  . Fluticasone-Salmeterol (ADVAIR HFA IN) Inhale into the lungs.    [name] KLOR-CON M10 10 MEQ tablet Take 10 mEq by mouth daily.     [name] triamcinolone cream (KENALOG) 0.1 % Apply topically 2 (two) times daily.     No current facility-administered medications for this visit.    Past Medical History  Diagnosis Date  . Syncope   . Hypertension   . COPD (chronic obstructive pulmonary disease)   . Hyperlipidemia   . Diabetes mellitus type II   . Renal cell carcinoma     I do not see confirmation of this diagnosis.  He sees a urologist currently.      Past Surgical History  Procedure  Laterality Date  . Spinal fixation surgery  1974    Family History  Problem Relation Age of Onset  . Heart disease Mother     No details.  Still alive age 89  . Emphysema Father   . Asthma Father     History   Social History  . Marital Status: Married    Spouse Name: N/A    Number of Children: N/A  . Years of Education: N/A   Occupational History  . Not on file.   Social History Main Topics  . Smoking status: Never Smoker   . Smokeless tobacco: Not on file  . Alcohol Use: No  . Drug Use: Not on file  . Sexual Activity: Not on file   Other Topics Concern  . Not on file   Social History Narrative   Lives alone.  Wife died in 04/25/23.  Lives in apartment.      ROS:  As stated in the HPI and negative for all other systems.  PHYSICAL EXAM BP 138/84 mmHg  Pulse 63  Ht 5' 9.5" (1.765 m)  Wt 241 lb 8 oz (109.544 kg)  BMI 35.16 kg/m2  GENERAL:  Well appearing HEENT:  Pupils equal round and reactive, fundi not visualized, oral mucosa unremarkable, proptosis NECK:  No jugular venous distention, waveform within normal limits, carotid upstroke brisk and symmetric, no bruits, no thyromegaly LYMPHATICS:  No cervical, inguinal adenopathy LUNGS:  Clear to auscultation bilaterally BACK:  No CVA tenderness CHEST:  Unremarkable HEART:  PMI not displaced or sustained,S1 and S2 within normal limits, no S3, no S4, no clicks, no rubs, no murmurs ABD:  Flat, positive bowel sounds normal in frequency in pitch, no bruits, no rebound, no guarding, no midline pulsatile mass, no hepatomegaly, no splenomegaly EXT:  2 plus pulses throughout, no edema, no cyanosis no clubbing SKIN:  Positive rash on legs no nodules NEURO:  Cranial nerves II through XII grossly intact, motor grossly intact throughout PSYCH:  Cognitively intact, oriented to person place and time   EKG:  Sinus rhythm, left axis deviation, poor anterior R wave progression, premature ventricular contractions, lateral T-wave  inversion consider ischemia. no old EKG for comparison. 07/11/2014  ASSESSMENT AND PLAN  PALPITATIONS:  He has PVCs noted. I will check a TSH as review of the records sent to [place] do not indicate a recent TSH. I will also check a 24-hour Holter. Further evaluation will be based on these results.  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 I don't suspect ischemia based on the previous nuclear study. However, I will followup with an echocardiogram.  HTN:  His blood pressure is well controlled. She will continue the meds as listed.

## 2014-07-11 NOTE — Patient Instructions (Signed)
Your physician has requested that you have an echocardiogram. Echocardiography is a painless test that uses sound waves to create images of your heart. It provides your doctor with information about the size and shape of your heart and how well your heart's chambers and valves are working. This procedure takes approximately one hour. There are no restrictions for this procedure.  Your physician has recommended that you wear a holter monitor for 24 hours. Holter monitors are medical devices that record the heart's electrical activity. Doctors most often use these monitors to diagnose arrhythmias. Arrhythmias are problems with the speed or rhythm of the heartbeat. The monitor is a small, portable device. You can wear one while you do your normal daily activities. This is usually used to diagnose what is causing palpitations/syncope (passing out).  Your physician recommends that you return for lab work in: Today.  Your physician recommends that you schedule a follow-up appointment in: after testing with Dr. Percival Spanish.

## 2014-07-12 DIAGNOSIS — J309 Allergic rhinitis, unspecified: Secondary | ICD-10-CM | POA: Insufficient documentation

## 2014-07-12 DIAGNOSIS — M545 Low back pain, unspecified: Secondary | ICD-10-CM | POA: Insufficient documentation

## 2014-07-12 DIAGNOSIS — R972 Elevated prostate specific antigen [PSA]: Secondary | ICD-10-CM | POA: Insufficient documentation

## 2014-07-13 ENCOUNTER — Ambulatory Visit (HOSPITAL_COMMUNITY)
Admission: RE | Admit: 2014-07-13 | Discharge: 2014-07-13 | Disposition: A | Discharge status: Home / Self Care | Payer: Medicare Other | Source: Ambulatory Visit | Attending: Cardiovascular Disease | Admitting: Cardiovascular Disease

## 2014-07-13 ENCOUNTER — Emergency Department (HOSPITAL_COMMUNITY)
Admission: EM | Admit: 2014-07-13 | Discharge: 2014-07-13 | Disposition: A | Discharge status: Home / Self Care | Payer: Medicare Other | Attending: Emergency Medicine | Admitting: Emergency Medicine

## 2014-07-13 ENCOUNTER — Encounter (HOSPITAL_COMMUNITY): Payer: Self-pay | Admitting: Emergency Medicine

## 2014-07-13 ENCOUNTER — Emergency Department (HOSPITAL_COMMUNITY): Payer: Medicare Other

## 2014-07-13 DIAGNOSIS — E119 Type 2 diabetes mellitus without complications: Secondary | ICD-10-CM | POA: Insufficient documentation

## 2014-07-13 DIAGNOSIS — J320 Chronic maxillary sinusitis: Secondary | ICD-10-CM | POA: Insufficient documentation

## 2014-07-13 DIAGNOSIS — R51 Headache: Secondary | ICD-10-CM | POA: Insufficient documentation

## 2014-07-13 DIAGNOSIS — R42 Dizziness and giddiness: Secondary | ICD-10-CM | POA: Insufficient documentation

## 2014-07-13 DIAGNOSIS — R0981 Nasal congestion: Secondary | ICD-10-CM | POA: Diagnosis present

## 2014-07-13 DIAGNOSIS — J449 Chronic obstructive pulmonary disease, unspecified: Secondary | ICD-10-CM | POA: Diagnosis not present

## 2014-07-13 DIAGNOSIS — R519 Headache, unspecified: Secondary | ICD-10-CM

## 2014-07-13 DIAGNOSIS — I10 Essential (primary) hypertension: Secondary | ICD-10-CM | POA: Insufficient documentation

## 2014-07-13 DIAGNOSIS — I359 Nonrheumatic aortic valve disorder, unspecified: Secondary | ICD-10-CM | POA: Diagnosis present

## 2014-07-13 DIAGNOSIS — Z8553 Personal history of malignant neoplasm of renal pelvis: Secondary | ICD-10-CM | POA: Insufficient documentation

## 2014-07-13 DIAGNOSIS — Z79899 Other long term (current) drug therapy: Secondary | ICD-10-CM | POA: Diagnosis not present

## 2014-07-13 DIAGNOSIS — R002 Palpitations: Secondary | ICD-10-CM

## 2014-07-13 MED ORDER — CETIRIZINE HCL 10 MG PO CAPS: 10.0000 mg | ORAL_CAPSULE | Freq: Every day | ORAL | Status: DC

## 2014-07-13 MED ORDER — AMOXICILLIN 500 MG PO CAPS: 500.0000 mg | ORAL_CAPSULE | Freq: Three times a day (TID) | ORAL | Status: DC

## 2014-07-13 MED ORDER — FLUTICASONE PROPIONATE 50 MCG/ACT NA SUSP: NASAL | Status: DC

## 2014-07-13 MED ORDER — MECLIZINE HCL 25 MG PO TABS: 25.0000 mg | ORAL_TABLET | Freq: Three times a day (TID) | ORAL | Status: DC | PRN

## 2014-07-13 NOTE — ED Notes (Signed)
MD at bedside. 

## 2014-07-13 NOTE — Progress Notes (Signed)
2D Echo Performed 07/13/2014    Marygrace Drought, RCS

## 2014-07-13 NOTE — ED Provider Notes (Signed)
CSN: [id number]     Arrival date & time 07/13/14  1449 History   First MD Initiated Contact with Patient 07/13/14 1528     Chief Complaint  Patient presents with  . Nasal Congestion  . Cough      HPI  Patient presents for evaluation of "sinus infection" and dizziness. She's had facial pressure and sinus congestion and nasal congestion with nocturnal cough for several months. Worse over the last 2 night story feels like he "drowns" if he lives supine. He produces some "clear snotty" no blood. No fever. Over the last 48 hours had episodes where he stands and feels like things been around very briefly. No ongoing vertigo. Presents here. No additional CVA symptoms with any numbness or weakness. No headache per se, just facial pressure.  Past Medical History  Diagnosis Date  . Syncope   . Hypertension   . COPD (chronic obstructive pulmonary disease)   . Hyperlipidemia   . Diabetes mellitus type II   . Renal cell carcinoma     I do not see confirmation of this diagnosis.  He sees a urologist currently.     Past Surgical History  Procedure Laterality Date  . Spinal fixation surgery  1974   Family History  Problem Relation Age of Onset  . Heart disease Mother     No details.  Still alive age 25  . Emphysema Father   . Asthma Father    History  Substance Use Topics  . Smoking status: Never Smoker   . Smokeless tobacco: Not on file  . Alcohol Use: No    Review of Systems  Constitutional: Negative for fever, chills, diaphoresis, appetite change and fatigue.  HENT: Positive for congestion, rhinorrhea and sinus pressure. Negative for mouth sores, sore throat and trouble swallowing.   Eyes: Negative for visual disturbance.  Respiratory: Negative for cough, chest tightness, shortness of breath and wheezing.   Cardiovascular: Negative for chest pain.  Gastrointestinal: Negative for nausea, vomiting, abdominal pain, diarrhea and abdominal distention.  Endocrine: Negative for  polydipsia, polyphagia and polyuria.  Genitourinary: Negative for dysuria, frequency and hematuria.  Musculoskeletal: Negative for gait problem.  Skin: Negative for color change, pallor and rash.  Neurological: Positive for dizziness. Negative for syncope, light-headedness and headaches.  Hematological: Does not bruise/bleed easily.  Psychiatric/Behavioral: Negative for behavioral problems and confusion.      Allergies  Review of patient's allergies indicates no known allergies.  Home Medications   Prior to Admission medications   Medication Sig Start Date End Date Taking? Authorizing Provider  albuterol (PROVENTIL HFA;VENTOLIN HFA) 108 (90 BASE) MCG/ACT inhaler Inhale 2 puffs into the lungs every 4 (four) hours as needed for wheezing or shortness of breath.   Yes Historical Provider, MD  amLODipine (NORVASC) 10 MG tablet Take 10 mg by mouth 2 (two) times daily.  12/25/11  Yes Historical Provider, MD  Fluticasone-Salmeterol (ADVAIR HFA IN) Inhale into the lungs.   Yes Historical Provider, MD  KLOR-CON M10 10 MEQ tablet Take 10 mEq by mouth daily.  12/09/11  Yes Historical Provider, MD  amoxicillin (AMOXIL) 500 MG capsule Take 1 capsule (500 mg total) by mouth 3 (three) times daily. 07/13/14   Tanna Furry, MD  augmented betamethasone dipropionate (DIPROLENE AF) 0.05 % cream Apply topically 2 (two) times daily. 11/23/13   Harden Mo, MD  Cetirizine HCl (ZYRTEC ALLERGY) 10 MG CAPS Take 1 capsule (10 mg total) by mouth at bedtime. 07/13/14   Tanna Furry, MD  fluticasone (  FLONASE) 50 MCG/ACT nasal spray 1 spray each nares bid 07/13/14   Tanna Furry, MD  meclizine (ANTIVERT) 25 MG tablet Take 1 tablet (25 mg total) by mouth 3 (three) times daily as needed for dizziness. 07/13/14   Tanna Furry, MD  triamcinolone cream (KENALOG) 0.1 % Apply topically 2 (two) times daily.    Historical Provider, MD   BP 144/87 mmHg  Pulse 52  Temp(Src) 98 F (36.7 C) (Oral)  Resp 18  SpO2 98% Physical Exam   Constitutional: He is oriented to person, place, and time. He appears well-developed and well-nourished. No distress.  HENT:  Head: Normocephalic.  Nares are congested.  Eyes: Conjunctivae are normal. Pupils are equal, round, and reactive to light. No scleral icterus.  Neck: Normal range of motion. Neck supple. No thyromegaly present.  Cardiovascular: Normal rate and regular rhythm.  Exam reveals no gallop and no friction rub.   No murmur heard. Pulmonary/Chest: Effort normal and breath sounds normal. No respiratory distress. He has no wheezes. He has no rales.  Abdominal: Soft. Bowel sounds are normal. He exhibits no distension. There is no tenderness. There is no rebound.  Musculoskeletal: Normal range of motion.  Neurological: He is alert and oriented to person, place, and time.  Skin: Skin is warm and dry. No rash noted.  Psychiatric: He has a normal mood and affect. His behavior is normal.    ED Course  Procedures (including critical care time) Labs Review Labs Reviewed - No data to display  Imaging Review Ct Head Wo Contrast  07/13/2014   CLINICAL DATA:  Chronic sinus infection. Altered balance. History of syncope and COPD. Diabetes.  EXAM: CT HEAD WITHOUT CONTRAST  CT MAXILLOFACIAL WITHOUT CONTRAST  TECHNIQUE: Multidetector CT imaging of the head and maxillofacial structures were performed using the standard protocol without intravenous contrast. Multiplanar CT image reconstructions of the maxillofacial structures were also generated.  COMPARISON:  None.  FINDINGS: CT HEAD FINDINGS  The brainstem, cerebellum, cerebral peduncles, thalamus, basal ganglia, basilar cisterns, and ventricular system appear within normal limits. No intracranial hemorrhage, mass lesion, or acute CVA.  CT MAXILLOFACIAL FINDINGS  Mild chronic left maxillary sinusitis inferiorly. Right nasal mucosal swelling. No facial fracture or acute facial findings.  Cervical spondylosis and degenerative disc disease noted  with multilevel foraminal impingement. No middle ear fluid. Internal auditory canals appear symmetric.  IMPRESSION: 1. No significant intracranial abnormality. 2. Mild chronic left maxillary sinusitis. Mucosal swelling in the right nasal cavity. 3. Cervical spondylosis and degenerative disc disease. Middle ears appear unremarkable.   Electronically Signed   By: Sherryl Barters M.D.   On: 07/13/2014 17:03   Ct Maxillofacial Wo Cm  07/13/2014   CLINICAL DATA:  Chronic sinus infection. Altered balance. History of syncope and COPD. Diabetes.  EXAM: CT HEAD WITHOUT CONTRAST  CT MAXILLOFACIAL WITHOUT CONTRAST  TECHNIQUE: Multidetector CT imaging of the head and maxillofacial structures were performed using the standard protocol without intravenous contrast. Multiplanar CT image reconstructions of the maxillofacial structures were also generated.  COMPARISON:  None.  FINDINGS: CT HEAD FINDINGS  The brainstem, cerebellum, cerebral peduncles, thalamus, basal ganglia, basilar cisterns, and ventricular system appear within normal limits. No intracranial hemorrhage, mass lesion, or acute CVA.  CT MAXILLOFACIAL FINDINGS  Mild chronic left maxillary sinusitis inferiorly. Right nasal mucosal swelling. No facial fracture or acute facial findings.  Cervical spondylosis and degenerative disc disease noted with multilevel foraminal impingement. No middle ear fluid. Internal auditory canals appear symmetric.  IMPRESSION: 1. No significant  intracranial abnormality. 2. Mild chronic left maxillary sinusitis. Mucosal swelling in the right nasal cavity. 3. Cervical spondylosis and degenerative disc disease. Middle ears appear unremarkable.   Electronically Signed   By: Sherryl Barters M.D.   On: 07/13/2014 17:03     EKG Interpretation None      MDM   Final diagnoses:  Headache  Vertigo  Chronic maxillary sinusitis     Patient is symptomatically his vertigo here. Given a prescription for meclizine use at home. Plan is  Zyrtec and Flonase for his nasal congestion and sinusitis. Primary care follow-up. Given vertigo precautions. Antivert until 24 hours of symptoms, no driving until 24 hours without symptoms.   Tanna Furry, MD 07/13/14 [phone]

## 2014-07-13 NOTE — ED Notes (Signed)
Patient transported to CT 

## 2014-07-13 NOTE — ED Notes (Signed)
Pt states that he has had a sinus infection x 3 months.  States that he has been feeling like mucous is sliding down the back of his throat.  States that he feels like his ears are messed up because it messes with his balance.  Pt ambulated independently to triage chair with no difficulty.

## 2014-07-18 ENCOUNTER — Emergency Department (HOSPITAL_COMMUNITY)
Admission: EM | Admit: 2014-07-18 | Discharge: 2014-07-18 | Disposition: A | Discharge status: Home / Self Care | Payer: Medicare Other | Attending: Emergency Medicine | Admitting: Emergency Medicine

## 2014-07-18 ENCOUNTER — Encounter (HOSPITAL_COMMUNITY): Payer: Self-pay | Admitting: Emergency Medicine

## 2014-07-18 DIAGNOSIS — Z7951 Long term (current) use of inhaled steroids: Secondary | ICD-10-CM | POA: Diagnosis not present

## 2014-07-18 DIAGNOSIS — R531 Weakness: Secondary | ICD-10-CM | POA: Insufficient documentation

## 2014-07-18 DIAGNOSIS — J449 Chronic obstructive pulmonary disease, unspecified: Secondary | ICD-10-CM | POA: Insufficient documentation

## 2014-07-18 DIAGNOSIS — E119 Type 2 diabetes mellitus without complications: Secondary | ICD-10-CM | POA: Diagnosis not present

## 2014-07-18 DIAGNOSIS — T360X5A Adverse effect of penicillins, initial encounter: Secondary | ICD-10-CM | POA: Diagnosis not present

## 2014-07-18 DIAGNOSIS — I10 Essential (primary) hypertension: Secondary | ICD-10-CM | POA: Insufficient documentation

## 2014-07-18 DIAGNOSIS — J0100 Acute maxillary sinusitis, unspecified: Secondary | ICD-10-CM | POA: Diagnosis present

## 2014-07-18 DIAGNOSIS — Z7952 Long term (current) use of systemic steroids: Secondary | ICD-10-CM | POA: Insufficient documentation

## 2014-07-18 DIAGNOSIS — Z792 Long term (current) use of antibiotics: Secondary | ICD-10-CM | POA: Insufficient documentation

## 2014-07-18 DIAGNOSIS — Z79899 Other long term (current) drug therapy: Secondary | ICD-10-CM | POA: Insufficient documentation

## 2014-07-18 LAB — COMPREHENSIVE METABOLIC PANEL
ALK PHOS: 96 U/L (ref 39–117)
ALT: 42 U/L (ref 0–53)
AST: 34 U/L (ref 0–37)
Albumin: 3.2 g/dL — ABNORMAL LOW (ref 3.5–5.2)
Anion gap: 9 (ref 5–15)
BUN: 17 mg/dL (ref 6–23)
CO2: 31 meq/L (ref 19–32)
Calcium: 9.2 mg/dL (ref 8.4–10.5)
Chloride: 99 mEq/L (ref 96–112)
Creatinine, Ser: 0.8 mg/dL (ref 0.50–1.35)
GFR, EST NON AFRICAN AMERICAN: 88 mL/min — AB (ref >90)
GLUCOSE: 142 mg/dL — AB (ref 70–99)
POTASSIUM: 3.2 meq/L — AB (ref 3.7–5.3)
Sodium: 139 mEq/L (ref 137–147)
Total Bilirubin: 0.7 mg/dL (ref 0.3–1.2)
Total Protein: 8.1 g/dL (ref 6.0–8.3)

## 2014-07-18 LAB — CBG MONITORING, ED: Glucose-Capillary: 103 mg/dL — ABNORMAL HIGH (ref 70–99)

## 2014-07-18 LAB — CBC WITH DIFFERENTIAL/PLATELET
Basophils Absolute: 0.1 10*3/uL (ref 0.0–0.1)
Basophils Relative: 1 % (ref 0–1)
Eosinophils Absolute: 0.3 10*3/uL (ref 0.0–0.7)
Eosinophils Relative: 3 % (ref 0–5)
HCT: 39.3 % (ref 39.0–52.0)
Hemoglobin: 13.3 g/dL (ref 13.0–17.0)
LYMPHS ABS: 2 10*3/uL (ref 0.7–4.0)
LYMPHS PCT: 25 % (ref 12–46)
MCH: 31.3 pg (ref 26.0–34.0)
MCHC: 33.8 g/dL (ref 30.0–36.0)
MCV: 92.5 fL (ref 78.0–100.0)
Monocytes Absolute: 0.6 10*3/uL (ref 0.1–1.0)
Monocytes Relative: 8 % (ref 3–12)
NEUTROS PCT: 63 % (ref 43–77)
Neutro Abs: 5.1 10*3/uL (ref 1.7–7.7)
PLATELETS: 238 10*3/uL (ref 150–400)
RBC: 4.25 MIL/uL (ref 4.22–5.81)
RDW: 13.2 % (ref 11.5–15.5)
WBC: 8 10*3/uL (ref 4.0–10.5)

## 2014-07-18 LAB — TROPONIN I: Troponin I: <0.3 ng/mL (ref <0.30)

## 2014-07-18 MED ORDER — LEVOFLOXACIN 500 MG PO TABS: 500.0000 mg | ORAL_TABLET | Freq: Every day | ORAL | Status: DC

## 2014-07-18 NOTE — ED Provider Notes (Signed)
CSN: [id number]     Arrival date & time 07/18/14  1445 History   First MD Initiated Contact with Patient 07/18/14 1625     Chief Complaint  Patient presents with  . sinus infection    (Consider location/radiation/quality/duration/timing/severity/associated sxs/prior Treatment) HPI [name] [name] is a 68 yo male with report of adverse effect after taking antibiotic.  He came to the ED 5 days ago and was diagnosed with maxillary sinusitis.  He was given antivert for dizziness and zyrtec and amoxicillin for the sinus infection.  He feels like whenever he takes the amoxicillin, he becomes generally weak.  He states he has taken the other 2 medicines today and not felt weak, but over the weekend, shortly after taking the dose of abx, he felt like someone had drained his energy.  He denies any focal weakness, headache, speech or visual changes, chest pain, new shortness of breath, nausea or vomiting or pain anywhere.  He does not have any complaint today other than wanting the antibiotic changed.    Past Medical History  Diagnosis Date  . Syncope   . Hypertension   . COPD (chronic obstructive pulmonary disease)   . Hyperlipidemia   . Diabetes mellitus type II   . Renal cell carcinoma     I do not see confirmation of this diagnosis.  He sees a urologist currently.     Past Surgical History  Procedure Laterality Date  . Spinal fixation surgery  1974   Family History  Problem Relation Age of Onset  . Heart disease Mother     No details.  Still alive age 36  . Emphysema Father   . Asthma Father    History  Substance Use Topics  . Smoking status: Never Smoker   . Smokeless tobacco: Not on file  . Alcohol Use: No    Review of Systems  Constitutional: Positive for fatigue. Negative for fever and chills.  HENT: Negative for sore throat.   Eyes: Negative for visual disturbance.  Respiratory: Negative for cough and shortness of breath.   Cardiovascular: Negative for chest pain and leg  swelling.  Gastrointestinal: Negative for nausea, vomiting and diarrhea.  Genitourinary: Negative for dysuria.  Musculoskeletal: Negative for myalgias.  Skin: Negative for rash.  Neurological: Negative for weakness, numbness and headaches.    Allergies  Review of patient's allergies indicates no known allergies.  Home Medications   Prior to Admission medications   Medication Sig Start Date End Date Taking? Authorizing Provider  albuterol (PROVENTIL HFA;VENTOLIN HFA) 108 (90 BASE) MCG/ACT inhaler Inhale 2 puffs into the lungs every 4 (four) hours as needed for wheezing or shortness of breath.   Yes Historical Provider, MD  amLODipine (NORVASC) 10 MG tablet Take 10 mg by mouth 2 (two) times daily.  12/25/11  Yes Historical Provider, MD  amoxicillin (AMOXIL) 500 MG capsule Take 1 capsule (500 mg total) by mouth 3 (three) times daily. 07/13/14  Yes Tanna Furry, MD  augmented betamethasone dipropionate (DIPROLENE AF) 0.05 % cream Apply topically 2 (two) times daily. 11/23/13  Yes Harden Mo, MD  Cetirizine HCl (ZYRTEC ALLERGY) 10 MG CAPS Take 1 capsule (10 mg total) by mouth at bedtime. 07/13/14  Yes Tanna Furry, MD  fluticasone Asencion Islam) 50 MCG/ACT nasal spray 1 spray each nares bid 07/13/14  Yes Tanna Furry, MD  meclizine (ANTIVERT) 25 MG tablet Take 1 tablet (25 mg total) by mouth 3 (three) times daily as needed for dizziness. 07/13/14  Yes Tanna Furry, MD  Fluticasone-Salmeterol (ADVAIR HFA IN) Inhale into the lungs.    Historical Provider, MD  KLOR-CON M10 10 MEQ tablet Take 10 mEq by mouth daily.  12/09/11   Historical Provider, MD  triamcinolone cream (KENALOG) 0.1 % Apply topically 2 (two) times daily.    Historical Provider, MD   BP 138/86 mmHg  Pulse 57  Temp(Src) 97.9 F (36.6 C) (Oral)  Resp 18  SpO2 96% Physical Exam  Constitutional: He is oriented to person, place, and time. He appears well-developed and well-nourished. No distress.  HENT:  Head: Normocephalic and atraumatic.   Right Ear: Tympanic membrane normal.  Left Ear: Tympanic membrane normal.  Mouth/Throat: Oropharynx is clear and moist. No oropharyngeal exudate.  No facial tenderness  Eyes: Conjunctivae are normal. Pupils are equal, round, and reactive to light. No scleral icterus.  Neck: Neck supple. No thyromegaly present.  Cardiovascular: Normal rate, regular rhythm and intact distal pulses.  Exam reveals no gallop and no friction rub.   No murmur heard. Pulmonary/Chest: Effort normal and breath sounds normal. No respiratory distress. He has no wheezes. He has no rales. He exhibits no tenderness.  Abdominal: Soft. There is no tenderness.  Musculoskeletal: He exhibits no tenderness.  Lymphadenopathy:    He has no cervical adenopathy.  Neurological: He is alert and oriented to person, place, and time. He has normal strength. No cranial nerve deficit or sensory deficit. Coordination normal. GCS eye subscore is 4. GCS verbal subscore is 5. GCS motor subscore is 6.  Reflex Scores:      Patellar reflexes are 2+ on the right side and 2+ on the left side. Cranial nerves 2-12 intact  Skin: Skin is warm and dry. No rash noted. He is not diaphoretic.  Psychiatric: He has a normal mood and affect.  Nursing note and vitals reviewed.   ED Course  Procedures (including critical care time) Labs Review Labs Reviewed  COMPREHENSIVE METABOLIC PANEL - Abnormal; Notable for the following:    Potassium 3.2 (*)    Glucose, Bld 142 (*)    Albumin 3.2 (*)    GFR calc non Af Amer 88 (*)    All other components within normal limits  CBG MONITORING, ED - Abnormal; Notable for the following:    Glucose-Capillary 103 (*)    All other components within normal limits  CBC WITH DIFFERENTIAL  TROPONIN I    Imaging Review No results found.   EKG Interpretation None      Date: 07/18/2014  Rate: 59  Rhythm: normal sinus rhythm  Axis: P: 55, QRS: -49, T: 46  Intervals: PR interval 180 ms QRS duration 112  ms QT/QTc 477/473 ms  Narrative Interpretation: Sinus rhythm Sinus pause Left anterior fascicular block Abnormal R-wave progression, late transition Borderline abnrm T, anterolateral leads Baseline wander in lead(s) II III aVF    MDM   Final diagnoses:  Episode of generalized weakness  Medication course changed   68 yo male presenting with report of general weakness after taking prescribed antibiotic.  He has avoided taking the antibiotic today and has not had any episodes of weakness.  He was already being treated for dizziness with meclizine but he did not experience dizziness while taking the antibiotic only general weakness.  I doubt any significant acute abnormality including cardiac or neurologic etiology for the episodes as he never experienced any chest pain, shortness of breath or focal neuro deficits at any time.  On exam he remains symptom free with a normal neuro exam.  CBC, CMP, Troponin and EKG done without significant abnormality.  Case discussed with Dr. Johnney Killian who is in agreement for discharge after negative results of work-up with modified antibiotic.  He is instructed to follow-up with his PCP.  Pt is aware of plan and in agreement.  Return precautions provided.    Filed Vitals:   07/18/14 1500 07/18/14 1735 07/18/14 1907  BP: 138/86 132/81 137/84  Pulse: 57 98 68  Temp: 97.9 F (36.6 C)    TempSrc: Oral    Resp: 18 17 15   SpO2: 96% 100% 94%   Meds given in ED:  Medications - No data to display  Discharge Medication List as of 07/18/2014  7:03 PM    START taking these medications   Details  levofloxacin (LEVAQUIN) 500 MG tablet Take 1 tablet (500 mg total) by mouth daily., Starting 07/18/2014, Until Discontinued, Print         Britt Bottom, NP 07/20/14 [place], MD 07/20/14 [phone]

## 2014-07-18 NOTE — ED Notes (Addendum)
Pt was seen on the  Nov 11 for a sinus infection adn dizziness, prescribed antibiotics but states he cannot take that its too strong also prescribed something for dizziness. Pt states he want to see if meds can be changed. Pt is not dizzy at the moment

## 2014-07-18 NOTE — Discharge Instructions (Signed)
Please follow the directions provided.  Be sure to follow-up with your primary care provider to ensure you are getting better.  Stop taking the amoxicillin and start taking the levaquin for your sinus infection.  Don't hesitate to retunr for any new, worsening or concerning symptoms.     SEEK IMMEDIATE MEDICAL CARE IF:  You cannot perform your normal daily activities, such as getting dressed and feeding yourself.  You cannot walk up and down stairs, or you feel exhausted when you do so.  You have shortness of breath or chest pain.  You have difficulty moving parts of your body.  You have weakness in only one area of the body or on only one side of the body.  You have a fever.  You have trouble speaking or swallowing.  You cannot control your bladder or bowel movements.  You have black or bloody vomit or stools.

## 2014-07-20 ENCOUNTER — Encounter: Payer: Self-pay | Admitting: Cardiology

## 2014-07-20 ENCOUNTER — Ambulatory Visit (INDEPENDENT_AMBULATORY_CARE_PROVIDER_SITE_OTHER): Payer: Medicare Other | Admitting: Cardiology

## 2014-07-20 VITALS — BP 140/62 | HR 64 | Wt 244.3 lb

## 2014-07-20 DIAGNOSIS — R002 Palpitations: Secondary | ICD-10-CM

## 2014-07-20 NOTE — Patient Instructions (Signed)
Your physician recommends that you schedule a follow-up appointment in: 6 months with Dr. Hochrein  

## 2014-07-20 NOTE — Progress Notes (Signed)
As far as her   HPI The patient presents for evaluation of various complaints including palpitations.  This is the second visit. He had a negative stress perfusion study in 2012 a mildly reduced ejection fraction. However, followup echo after the first visit with me demonstrated normal left ventricular function and no significant abnormalities. He wore a Holter monitor which I reviewed with him today and the results are listed below. There were no sustained dysrhythmias and no symptoms while wearing it. He was in the emergency room since I saw him once with sinusitis and once with some mild dizziness. He has had a head CT with no acute findings. He's not had any presyncope or syncope. He's not had any chest pressure, neck or arm discomfort. The palpitations have been stable for years and he notices them on the machine when he takes his blood pressure but he doesn't feel. He says he's had these for years. Of note I also reviewed some recent labs and his TSH was normal.  No Known Allergies  Current Outpatient Prescriptions  Medication Sig Dispense Refill  . albuterol (PROVENTIL HFA;VENTOLIN HFA) 108 (90 BASE) MCG/ACT inhaler Inhale 2 puffs into the lungs every 4 (four) hours as needed for wheezing or shortness of breath.    [name] amLODipine (NORVASC) 10 MG tablet Take 10 mg by mouth 2 (two) times daily.     [name] augmented betamethasone dipropionate (DIPROLENE AF) 0.05 % cream Apply topically 2 (two) times daily. 50 g 5  . Cetirizine HCl (ZYRTEC ALLERGY) 10 MG CAPS Take 1 capsule (10 mg total) by mouth at bedtime. 30 capsule 1  . ciprofloxacin (CIPRO) 500 MG tablet Take 500 mg by mouth 2 (two) times daily.  0  . fluticasone (FLONASE) 50 MCG/ACT nasal spray 1 spray each nares bid 10 g 1  . Fluticasone-Salmeterol (ADVAIR HFA IN) Inhale into the lungs.    [name] KLOR-CON M10 10 MEQ tablet Take 10 mEq by mouth daily.     [name] levofloxacin (LEVAQUIN) 500 MG tablet Take 1 tablet (500 mg total) by mouth daily. 5 tablet 0    . meclizine (ANTIVERT) 25 MG tablet Take 1 tablet (25 mg total) by mouth 3 (three) times daily as needed for dizziness. 30 tablet 1  . triamcinolone cream (KENALOG) 0.1 % Apply topically 2 (two) times daily.     No current facility-administered medications for this visit.    Past Medical History  Diagnosis Date  . Syncope   . Hypertension   . COPD (chronic obstructive pulmonary disease)   . Hyperlipidemia   . Diabetes mellitus type II   . Renal cell carcinoma     I do not see confirmation of this diagnosis.  He sees a urologist currently.      Past Surgical History  Procedure Laterality Date  . Spinal fixation surgery  1974     ROS:  Urinary frequency and sinus drainage.  As stated in the HPI and negative for all other systems.  PHYSICAL EXAM BP 140/62 mmHg  Pulse 64  Wt 244 lb 4.8 oz (110.814 kg)  GENERAL:  Well appearing HEENT:  Pupils equal round and reactive, fundi not visualized, oral mucosa unremarkable, proptosis, poor dentition NECK:  No jugular venous distention, waveform within normal limits, carotid upstroke brisk and symmetric, no bruits, no thyromegaly LUNGS:  Clear to auscultation bilaterally CHEST:  Unremarkable HEART:  PMI not displaced or sustained,S1 and S2 within normal limits, no S3, no S4, no clicks, no rubs, no murmurs ABD:  Flat, positive bowel sounds normal in frequency in pitch, no bruits, no rebound, no guarding, no midline pulsatile mass, no hepatomegaly, no splenomegaly EXT:  2 plus pulses throughout, no edema, no cyanosis no clubbing SKIN:  Positive rash on legs no nodules   ASSESSMENT AND PLAN  PALPITATIONS:  He does have frequent ectopy particularly supraventricular on his monitor. He has ventricular ectopy as well. He had one episode of probable junctional or ventricular escape rhythm that was not symptomatic. He's had no sustained dysrhythmias. He's not particularly having symptoms. She's had a negative workup otherwise. Given this no  change in therapy is indicated. However, he will let me know if he has increased palpitations he will certainly let you know if he has any presyncope or syncope.  CARDIOMYOPATHY:  His EF was improved on the recent echo. No further evaluation is warranted. He had a negative stress perfusion study in 2012 and has no ongoing anginal symptoms.  HTN:  His blood pressure is well controlled. She will continue the meds as listed.

## 2014-07-21 ENCOUNTER — Ambulatory Visit: Payer: Medicare Other | Admitting: Cardiology

## 2014-09-02 ENCOUNTER — Emergency Department (HOSPITAL_COMMUNITY): Payer: Medicare Other

## 2014-09-02 ENCOUNTER — Encounter (HOSPITAL_COMMUNITY): Payer: Self-pay | Admitting: Emergency Medicine

## 2014-09-02 ENCOUNTER — Telehealth: Payer: Self-pay | Admitting: *Deleted

## 2014-09-02 ENCOUNTER — Emergency Department (HOSPITAL_COMMUNITY)
Admission: EM | Admit: 2014-09-02 | Discharge: 2014-09-02 | Disposition: A | Discharge status: Home / Self Care | Payer: Medicare Other | Attending: Emergency Medicine | Admitting: Emergency Medicine

## 2014-09-02 DIAGNOSIS — Z7952 Long term (current) use of systemic steroids: Secondary | ICD-10-CM | POA: Diagnosis not present

## 2014-09-02 DIAGNOSIS — I10 Essential (primary) hypertension: Secondary | ICD-10-CM | POA: Insufficient documentation

## 2014-09-02 DIAGNOSIS — E119 Type 2 diabetes mellitus without complications: Secondary | ICD-10-CM | POA: Diagnosis not present

## 2014-09-02 DIAGNOSIS — J018 Other acute sinusitis: Secondary | ICD-10-CM | POA: Insufficient documentation

## 2014-09-02 DIAGNOSIS — Z792 Long term (current) use of antibiotics: Secondary | ICD-10-CM | POA: Insufficient documentation

## 2014-09-02 DIAGNOSIS — Z79899 Other long term (current) drug therapy: Secondary | ICD-10-CM | POA: Diagnosis not present

## 2014-09-02 DIAGNOSIS — R0602 Shortness of breath: Secondary | ICD-10-CM | POA: Diagnosis present

## 2014-09-02 DIAGNOSIS — J441 Chronic obstructive pulmonary disease with (acute) exacerbation: Secondary | ICD-10-CM | POA: Insufficient documentation

## 2014-09-02 DIAGNOSIS — Z85528 Personal history of other malignant neoplasm of kidney: Secondary | ICD-10-CM | POA: Diagnosis not present

## 2014-09-02 LAB — CBC
HCT: 41.3 % (ref 39.0–52.0)
Hemoglobin: 13.5 g/dL (ref 13.0–17.0)
MCH: 30.8 pg (ref 26.0–34.0)
MCHC: 32.7 g/dL (ref 30.0–36.0)
MCV: 94.3 fL (ref 78.0–100.0)
Platelets: 297 10*3/uL (ref 150–400)
RBC: 4.38 MIL/uL (ref 4.22–5.81)
RDW: 13.2 % (ref 11.5–15.5)
WBC: 8.9 10*3/uL (ref 4.0–10.5)

## 2014-09-02 LAB — COMPREHENSIVE METABOLIC PANEL
ALBUMIN: 3.5 g/dL (ref 3.5–5.2)
ALT: 87 U/L — ABNORMAL HIGH (ref 0–53)
ANION GAP: 8 (ref 5–15)
AST: 74 U/L — AB (ref 0–37)
Alkaline Phosphatase: 178 U/L — ABNORMAL HIGH (ref 39–117)
BUN: 18 mg/dL (ref 6–23)
CALCIUM: 9.2 mg/dL (ref 8.4–10.5)
CO2: 30 mmol/L (ref 19–32)
Chloride: 99 mEq/L (ref 96–112)
Creatinine, Ser: 0.9 mg/dL (ref 0.50–1.35)
GFR calc Af Amer: >90 mL/min (ref >90)
GFR calc non Af Amer: 84 mL/min — ABNORMAL LOW (ref >90)
Glucose, Bld: 118 mg/dL — ABNORMAL HIGH (ref 70–99)
Potassium: 2.9 mmol/L — ABNORMAL LOW (ref 3.5–5.1)
Sodium: 137 mmol/L (ref 135–145)
Total Bilirubin: 1 mg/dL (ref 0.3–1.2)
Total Protein: 7.8 g/dL (ref 6.0–8.3)

## 2014-09-02 LAB — I-STAT TROPONIN, ED: TROPONIN I, POC: 0 ng/mL (ref 0.00–0.08)

## 2014-09-02 MED ORDER — POTASSIUM CHLORIDE CRYS ER 20 MEQ PO TBCR
40.0000 meq | EXTENDED_RELEASE_TABLET | Freq: Once | ORAL | Status: AC
  Administered 2014-09-02: 40 meq via ORAL
  Filled 2014-09-02: qty 2

## 2014-09-02 MED ORDER — FLUTICASONE PROPIONATE 50 MCG/ACT NA SUSP: NASAL | Status: DC

## 2014-09-02 MED ORDER — AMOXICILLIN-POT CLAVULANATE 875-125 MG PO TABS: 1.0000 | ORAL_TABLET | Freq: Two times a day (BID) | ORAL | Status: DC

## 2014-09-02 NOTE — Discharge Instructions (Signed)
Sinusitis °Sinusitis is redness, soreness, and puffiness (inflammation) of the air pockets in the bones of your face (sinuses). The redness, soreness, and puffiness can cause air and mucus to get trapped in your sinuses. This can allow germs to grow and cause an infection.  °HOME CARE  °· Drink enough fluids to keep your pee (urine) clear or pale yellow. °· Use a humidifier in your home. °· Run a hot shower to create steam in the bathroom. Sit in the bathroom with the door closed. Breathe in the steam 3-4 times a day. °· Put a warm, moist washcloth on your face 3-4 times a day, or as told by your doctor. °· Use salt water sprays (saline sprays) to wet the thick fluid in your nose. This can help the sinuses drain. °· Only take medicine as told by your doctor. °GET HELP RIGHT AWAY IF:  °· Your pain gets worse. °· You have very bad headaches. °· You are sick to your stomach (nauseous). °· You throw up (vomit). °· You are very sleepy (drowsy) all the time. °· Your face is puffy (swollen). °· Your vision changes. °· You have a stiff neck. °· You have trouble breathing. °MAKE SURE YOU:  °· Understand these instructions. °· Will watch your condition. °· Will get help right away if you are not doing well or get worse. °Document Released: 02/05/2008 Document Revised: 05/13/2012 Document Reviewed: 03/24/2012 °[employer]® Patient Information ©2015 [employer]. This information is not intended to replace advice given to you by your health care provider. Make sure you discuss any questions you have with your health care provider. ° °

## 2014-09-02 NOTE — ED Notes (Signed)
Contacted by pharmacy that patient has prescription for Augmentin but states that he is allergic.  Levaquin 500mg  i tab PO QD #10 prescribed by Glena Norfolk, PA provided.

## 2014-09-02 NOTE — ED Notes (Signed)
Pt c/o worsening SOB x one year. Pt also sts "I have sleep apnea, sometimes I feel like my throat is closing, my potassium level is off, and sometimes I think my heart stops." Pt A&Ox4. Pt ambulatory from wheelchair to bed. Pt speaking in complete sentences without difficulty.

## 2014-09-02 NOTE — ED Provider Notes (Signed)
CSN: [id number]     Arrival date & time 09/02/14  1415 History   First MD Initiated Contact with Patient 09/02/14 1525     Chief Complaint  Patient presents with  . Shortness of Breath     (Consider location/radiation/quality/duration/timing/severity/associated sxs/prior Treatment) Patient is a 69 y.o. male presenting with shortness of breath. The history is provided by the patient. No language interpreter was used.  Shortness of Breath Severity:  Moderate Onset quality:  Gradual Duration:  12 months Timing:  Constant Progression:  Worsening Chronicity:  New Context: URI   Relieved by:  Nothing Worsened by:  Activity Ineffective treatments:  None tried Associated symptoms: no fever and no sore throat   Pt has had nasal congestion and what sounds like a throat stricture for a year.   Pt was advised to have surgery by an ENT here in [place].  Pt feels like he is getting worse and that he may have a sinus infection.  Pt has had a cough but reports he feels like his lungs are okay  Past Medical History  Diagnosis Date  . Syncope   . Hypertension   . COPD (chronic obstructive pulmonary disease)   . Hyperlipidemia   . Diabetes mellitus type II   . Renal cell carcinoma     I do not see confirmation of this diagnosis.  He sees a urologist currently.     Past Surgical History  Procedure Laterality Date  . Spinal fixation surgery  1974   Family History  Problem Relation Age of Onset  . Heart disease Mother     No details.  Still alive age 64  . Emphysema Father   . Asthma Father    History  Substance Use Topics  . Smoking status: Never Smoker   . Smokeless tobacco: Not on file  . Alcohol Use: No    Review of Systems  Constitutional: Negative for fever.  HENT: Negative for sore throat.   Respiratory: Positive for shortness of breath.   All other systems reviewed and are negative.     Allergies  Review of patient's allergies indicates no known allergies.  Home  Medications   Prior to Admission medications   Medication Sig Start Date End Date Taking? Authorizing Provider  albuterol (PROVENTIL HFA;VENTOLIN HFA) 108 (90 BASE) MCG/ACT inhaler Inhale 2 puffs into the lungs every 4 (four) hours as needed for wheezing or shortness of breath.    Historical Provider, MD  amLODipine (NORVASC) 10 MG tablet Take 10 mg by mouth 2 (two) times daily.  12/25/11   Historical Provider, MD  augmented betamethasone dipropionate (DIPROLENE AF) 0.05 % cream Apply topically 2 (two) times daily. 11/23/13   Harden Mo, MD  Cetirizine HCl (ZYRTEC ALLERGY) 10 MG CAPS Take 1 capsule (10 mg total) by mouth at bedtime. 07/13/14   Tanna Furry, MD  ciprofloxacin (CIPRO) 500 MG tablet Take 500 mg by mouth 2 (two) times daily. 05/18/14   Historical Provider, MD  fluticasone Asencion Islam) 50 MCG/ACT nasal spray 1 spray each nares bid 07/13/14   Tanna Furry, MD  Fluticasone-Salmeterol (ADVAIR HFA IN) Inhale into the lungs.    Historical Provider, MD  KLOR-CON M10 10 MEQ tablet Take 10 mEq by mouth daily.  12/09/11   Historical Provider, MD  levofloxacin (LEVAQUIN) 500 MG tablet Take 1 tablet (500 mg total) by mouth daily. 07/18/14   Britt Bottom, NP  meclizine (ANTIVERT) 25 MG tablet Take 1 tablet (25 mg total) by mouth 3 (three) times  daily as needed for dizziness. 07/13/14   Tanna Furry, MD  triamcinolone cream (KENALOG) 0.1 % Apply topically 2 (two) times daily.    Historical Provider, MD   BP 140/88 mmHg  Pulse 62  Temp(Src) 98.1 F (36.7 C) (Oral)  Resp 18  SpO2 97% Physical Exam  Constitutional: He is oriented to person, place, and time. He appears well-developed and well-nourished.  HENT:  Head: Normocephalic.  Right Ear: External ear normal.  Dilated nasal mucosa   Eyes: Conjunctivae and EOM are normal. Pupils are equal, round, and reactive to light.  Neck: Normal range of motion. Neck supple.  Cardiovascular: Normal rate and normal heart sounds.   Pulmonary/Chest: Effort  normal and breath sounds normal.  Abdominal: He exhibits no distension.  Musculoskeletal: Normal range of motion.  Neurological: He is alert and oriented to person, place, and time.  Psychiatric: He has a normal mood and affect.  Nursing note and vitals reviewed.   ED Course  Procedures (including critical care time) Labs Review Labs Reviewed  COMPREHENSIVE METABOLIC PANEL - Abnormal; Notable for the following:    Potassium 2.9 (*)    Glucose, Bld 118 (*)    AST 74 (*)    ALT 87 (*)    Alkaline Phosphatase 178 (*)    GFR calc non Af Amer 84 (*)    All other components within normal limits  CBC  I-STAT TROPOININ, ED    Imaging Review Dg Chest 2 View  09/02/2014   CLINICAL DATA:  Shortness of breath congestion insomnia, initial evaluation, personal history of hypertension  EXAM: CHEST  2 VIEW  COMPARISON:  11/02/2009  FINDINGS: Moderately severe cardiac enlargement. Uncoiling of the aorta. Vascular pattern normal. Lungs clear. No pleural effusion.  IMPRESSION: No acute findings.  Stable cardiac enlargement.   Electronically Signed   By: Skipper Cliche M.D.   On: 09/02/2014 16:25     EKG Interpretation None      MDM   Final diagnoses:  Other acute sinusitis    Pt given rx for levaquin and flonase.   He reports these have worked in the past    Fransico Meadow, PA-C 09/02/14 [place], MD 09/02/14 2347

## 2014-09-26 ENCOUNTER — Other Ambulatory Visit (HOSPITAL_COMMUNITY): Payer: Self-pay | Admitting: Urology

## 2014-09-26 DIAGNOSIS — D49519 Neoplasm of unspecified behavior of unspecified kidney: Secondary | ICD-10-CM

## 2014-09-30 DIAGNOSIS — R519 Headache, unspecified: Secondary | ICD-10-CM | POA: Insufficient documentation

## 2014-09-30 DIAGNOSIS — R51 Headache: Secondary | ICD-10-CM

## 2014-10-06 ENCOUNTER — Ambulatory Visit (HOSPITAL_COMMUNITY)
Admission: RE | Admit: 2014-10-06 | Discharge: 2014-10-06 | Disposition: A | Discharge status: Home / Self Care | Payer: Medicare Other | Source: Ambulatory Visit | Attending: Urology | Admitting: Urology

## 2014-10-06 DIAGNOSIS — D495 Neoplasm of unspecified behavior of other genitourinary organs: Secondary | ICD-10-CM | POA: Insufficient documentation

## 2014-10-06 DIAGNOSIS — D49519 Neoplasm of unspecified behavior of unspecified kidney: Secondary | ICD-10-CM

## 2014-10-06 MED ORDER — GADOBENATE DIMEGLUMINE 529 MG/ML IV SOLN
20.0000 mL | Freq: Once | INTRAVENOUS | Status: AC | PRN
  Administered 2014-10-06: 20 mL via INTRAVENOUS

## 2014-10-14 ENCOUNTER — Other Ambulatory Visit (HOSPITAL_COMMUNITY): Payer: Self-pay | Admitting: Urology

## 2014-10-14 DIAGNOSIS — N289 Disorder of kidney and ureter, unspecified: Secondary | ICD-10-CM

## 2014-11-01 ENCOUNTER — Encounter (HOSPITAL_BASED_OUTPATIENT_CLINIC_OR_DEPARTMENT_OTHER): Payer: Medicare Other

## 2014-11-03 ENCOUNTER — Telehealth: Payer: Self-pay | Admitting: Cardiology

## 2014-11-03 DIAGNOSIS — R0982 Postnasal drip: Secondary | ICD-10-CM | POA: Insufficient documentation

## 2014-11-03 DIAGNOSIS — R51 Headache: Secondary | ICD-10-CM

## 2014-11-03 DIAGNOSIS — R519 Headache, unspecified: Secondary | ICD-10-CM | POA: Insufficient documentation

## 2014-11-03 NOTE — Telephone Encounter (Signed)
Brandy ([facility]) states  Vicenta Aly NP WOULD LIKE RESULTS OF MONITOR. SHE STATES SHE DOES NOT KNOW WHEN PATIENT WORE  IT.  RN INFORMED BRANDY WILL HAVE TO INVESTIGATE AND CONTACT

## 2014-11-03 NOTE — Telephone Encounter (Signed)
New Message        Office calling wanting to know if pt returned holter monitor, Please call back and advise.

## 2014-11-03 NOTE — Telephone Encounter (Signed)
Return call to [facility] health - Dr Fayrene Fearing OFFICE Previous message unsure what is needed. Left message on voicemail to call back.

## 2014-11-03 NOTE — Telephone Encounter (Signed)
Called spoke to [facility] Informed her that patient has not worn a heart monitor for Dr Percival Spanish Last office visit 07/21/15 and last test was echo 07/14/15 Verbalized understanding.

## 2014-11-07 ENCOUNTER — Ambulatory Visit (HOSPITAL_COMMUNITY): Payer: Medicare Other

## 2014-11-08 ENCOUNTER — Other Ambulatory Visit: Payer: Self-pay | Admitting: Radiology

## 2014-11-08 ENCOUNTER — Ambulatory Visit (HOSPITAL_COMMUNITY): Payer: Medicare Other

## 2014-11-09 ENCOUNTER — Ambulatory Visit (HOSPITAL_COMMUNITY): Admission: RE | Admit: 2014-11-09 | Payer: Medicare Other | Source: Ambulatory Visit

## 2014-11-14 ENCOUNTER — Other Ambulatory Visit: Payer: Self-pay | Admitting: Radiology

## 2014-11-15 ENCOUNTER — Ambulatory Visit (HOSPITAL_COMMUNITY): Payer: Medicare Other

## 2014-11-15 ENCOUNTER — Inpatient Hospital Stay (HOSPITAL_COMMUNITY): Admission: RE | Admit: 2014-11-15 | Payer: Medicare Other | Source: Ambulatory Visit

## 2014-11-15 ENCOUNTER — Encounter (HOSPITAL_COMMUNITY): Payer: Self-pay

## 2014-11-15 ENCOUNTER — Ambulatory Visit (HOSPITAL_COMMUNITY)
Admission: RE | Admit: 2014-11-15 | Discharge: 2014-11-15 | Disposition: A | Discharge status: Home / Self Care | Payer: Medicare Other | Source: Ambulatory Visit | Attending: Urology | Admitting: Urology

## 2014-11-15 ENCOUNTER — Other Ambulatory Visit: Payer: Self-pay

## 2014-11-15 DIAGNOSIS — J449 Chronic obstructive pulmonary disease, unspecified: Secondary | ICD-10-CM | POA: Diagnosis not present

## 2014-11-15 DIAGNOSIS — N2889 Other specified disorders of kidney and ureter: Secondary | ICD-10-CM | POA: Diagnosis present

## 2014-11-15 DIAGNOSIS — I10 Essential (primary) hypertension: Secondary | ICD-10-CM | POA: Insufficient documentation

## 2014-11-15 DIAGNOSIS — E119 Type 2 diabetes mellitus without complications: Secondary | ICD-10-CM | POA: Insufficient documentation

## 2014-11-15 DIAGNOSIS — N289 Disorder of kidney and ureter, unspecified: Secondary | ICD-10-CM

## 2014-11-15 LAB — CBC
HEMATOCRIT: 38.3 % — AB (ref 39.0–52.0)
Hemoglobin: 12.9 g/dL — ABNORMAL LOW (ref 13.0–17.0)
MCH: 30.6 pg (ref 26.0–34.0)
MCHC: 33.7 g/dL (ref 30.0–36.0)
MCV: 91 fL (ref 78.0–100.0)
PLATELETS: 286 10*3/uL (ref 150–400)
RBC: 4.21 MIL/uL — ABNORMAL LOW (ref 4.22–5.81)
RDW: 13.2 % (ref 11.5–15.5)
WBC: 6.5 10*3/uL (ref 4.0–10.5)

## 2014-11-15 LAB — BASIC METABOLIC PANEL
ANION GAP: 6 (ref 5–15)
BUN: 15 mg/dL (ref 6–23)
CHLORIDE: 100 mmol/L (ref 96–112)
CO2: 30 mmol/L (ref 19–32)
Calcium: 8.6 mg/dL (ref 8.4–10.5)
Creatinine, Ser: 0.78 mg/dL (ref 0.50–1.35)
GFR calc non Af Amer: 89 mL/min — ABNORMAL LOW (ref >90)
Glucose, Bld: 105 mg/dL — ABNORMAL HIGH (ref 70–99)
POTASSIUM: 2.9 mmol/L — AB (ref 3.5–5.1)
Sodium: 136 mmol/L (ref 135–145)

## 2014-11-15 LAB — GLUCOSE, CAPILLARY: GLUCOSE-CAPILLARY: 98 mg/dL (ref 70–99)

## 2014-11-15 LAB — APTT: aPTT: 30 seconds (ref 24–37)

## 2014-11-15 LAB — PROTIME-INR
INR: 1.01 (ref 0.00–1.49)
Prothrombin Time: 13.4 seconds (ref 11.6–15.2)

## 2014-11-15 MED ORDER — MIDAZOLAM HCL 2 MG/2ML IJ SOLN
INTRAMUSCULAR | Status: AC
  Filled 2014-11-15: qty 6

## 2014-11-15 MED ORDER — NALOXONE HCL 0.4 MG/ML IJ SOLN
INTRAMUSCULAR | Status: AC
  Filled 2014-11-15: qty 1

## 2014-11-15 MED ORDER — SODIUM CHLORIDE 0.9 % IV SOLN
INTRAVENOUS | Status: DC
  Administered 2014-11-15: 11:00:00 via INTRAVENOUS

## 2014-11-15 MED ORDER — FENTANYL CITRATE 0.05 MG/ML IJ SOLN
INTRAMUSCULAR | Status: AC | PRN
  Administered 2014-11-15: 25 ug via INTRAVENOUS

## 2014-11-15 MED ORDER — MIDAZOLAM HCL 2 MG/2ML IJ SOLN
INTRAMUSCULAR | Status: AC | PRN
  Administered 2014-11-15: 0.5 mg via INTRAVENOUS

## 2014-11-15 MED ORDER — FLUMAZENIL 0.5 MG/5ML IV SOLN
INTRAVENOUS | Status: AC
  Filled 2014-11-15: qty 5

## 2014-11-15 MED ORDER — FENTANYL CITRATE 0.05 MG/ML IJ SOLN
INTRAMUSCULAR | Status: AC
  Filled 2014-11-15: qty 4

## 2014-11-15 NOTE — H&P (Signed)
Chief Complaint: "I'm having a kidney biopsy"  Referring Physician(s): Nesi,Marc  History of Present Illness: [name] [name] is a 69 y.o. male recently diagnosed with prostate cancer and known bilateral renal masses. Recent MRI of the abdomen on 10/06/2014 has revealed an enlarging complex lesion in the lower pole of the right kidney now measuring 3.1 x 3.5 x 3.0 cm. He presents today for ultrasound-guided biopsy of the right lower pole renal lesion.  Past Medical History  Diagnosis Date  . Syncope   . Hypertension   . COPD (chronic obstructive pulmonary disease)   . Hyperlipidemia   . Renal cell carcinoma     I do not see confirmation of this diagnosis.  He sees a urologist currently.    . Diabetes mellitus type II     diet controlled    Past Surgical History  Procedure Laterality Date  . Spinal fixation surgery  1974    Allergies: Review of patient's allergies indicates no known allergies.  Medications: Prior to Admission medications   Medication Sig Start Date End Date Taking? Authorizing Provider  amLODipine (NORVASC) 10 MG tablet Take 10 mg by mouth 2 (two) times daily.  12/25/11   Historical Provider, MD  amoxicillin-clavulanate (AUGMENTIN) 875-125 MG per tablet Take 1 tablet by mouth every 12 (twelve) hours. Patient not taking: Reported on 11/04/2014 09/02/14   Fransico Meadow, PA-C  augmented betamethasone dipropionate (DIPROLENE AF) 0.05 % cream Apply topically 2 (two) times daily. Patient not taking: Reported on 11/04/2014 11/23/13   Harden Mo, MD  Cetirizine HCl (ZYRTEC ALLERGY) 10 MG CAPS Take 1 capsule (10 mg total) by mouth at bedtime. 07/13/14   Tanna Furry, MD  fluticasone [facility]) 50 MCG/ACT nasal spray 1 spray each nares bid Patient not taking: Reported on 11/04/2014 09/02/14   Hollace Kinnier Sofia, PA-C  KLOR-CON M10 10 MEQ tablet Take 10 mEq by mouth daily.  12/09/11   Historical Provider, MD  meclizine (ANTIVERT) 25 MG tablet Take 1 tablet (25 mg total) by mouth 3  (three) times daily as needed for dizziness. Patient not taking: Reported on 11/04/2014 07/13/14   Tanna Furry, MD  triamcinolone cream (KENALOG) 0.1 % Apply 1 application topically 2 (two) times daily as needed (sores.).     Historical Provider, MD    Family History  Problem Relation Age of Onset  . Heart disease Mother     No details.  Still alive age 43  . Emphysema Father   . Asthma Father     History   Social History  . Marital Status: Married    Spouse Name: N/A  . Number of Children: N/A  . Years of Education: N/A   Social History Main Topics  . Smoking status: Never Smoker   . Smokeless tobacco: Not on file  . Alcohol Use: No  . Drug Use: Not on file  . Sexual Activity: Not on file   Other Topics Concern  . None   Social History Narrative   Lives alone.  Wife died in 11-Apr-2023.  Lives in apartment.        Review of Systems  Constitutional: Negative for fever and chills.  Respiratory: Negative for cough and shortness of breath.   Cardiovascular: Negative for chest pain.  Gastrointestinal: Negative for nausea, vomiting, abdominal pain and blood in stool.  Genitourinary: Negative for dysuria and hematuria.       Occ rt flank pain  Neurological: Negative for headaches.  Hematological: Does not bruise/bleed easily.  Vital Signs: BP 139/84 mmHg  Pulse 51  Temp(Src) 98.6 F (37 C) (Oral)  Resp 18  SpO2 94%  Physical Exam  Constitutional: He is oriented to person, place, and time. He appears well-developed and well-nourished.  Cardiovascular:  Bradycardic with ectopy noted  Pulmonary/Chest: Effort normal. He has no wheezes.  Distant BS bilat  Abdominal: Soft. Bowel sounds are normal.  Mild rt CVA tenderness  Musculoskeletal: Normal range of motion. He exhibits edema.  Neurological: He is alert and oriented to person, place, and time.    Imaging: No results found.  Labs:  CBC:  Recent Labs  07/18/14 1753 09/02/14 1450  WBC 8.0 8.9  HGB 13.3  13.5  HCT 39.3 41.3  PLT 238 297    COAGS: No results for input(s): INR, APTT in the last 8760 hours.  BMP:  Recent Labs  07/18/14 1753 09/02/14 1450  NA 139 137  K 3.2* 2.9*  CL 99 99  CO2 31 30  GLUCOSE 142* 118*  BUN 17 18  CALCIUM 9.2 9.2  CREATININE 0.80 0.90  GFRNONAA 88* 84*  GFRAA >90 >90    LIVER FUNCTION TESTS:  Recent Labs  07/18/14 1753 09/02/14 1450  BILITOT 0.7 1.0  AST 34 74*  ALT 42 87*  ALKPHOS 96 178*  PROT 8.1 7.8  ALBUMIN 3.2* 3.5    TUMOR MARKERS: No results for input(s): AFPTM, CEA, CA199, CHROMGRNA in the last 8760 hours.  Assessment and Plan: [name] [name] is a 69 y.o. male recently diagnosed with prostate cancer and known bilateral renal masses. Recent MRI of the abdomen on 10/06/2014 has revealed an enlarging complex lesion in the lower pole of the right kidney now measuring 3.1 x 3.5 x 3.0 cm. He presents today for ultrasound-guided biopsy of the right lower pole renal lesion.Risks and benefits discussed with the patient/son including, but not limited to bleeding, infection, damage to adjacent structures or low yield requiring additional tests. All of the patient's questions were answered, patient is agreeable to proceed. Consent signed and in chart.       Signed: Autumn Messing 11/15/2014, 11:41 AM   I spent a total of 20 minutes face to face in clinical consultation, greater than 50% of which was counseling/coordinating care for right renal lesion biopsy

## 2014-11-15 NOTE — Discharge Instructions (Signed)
Leave bandaid on for 24 hours.  You may shower after 24 hours.  Please remove the bandaid before you shower.     Kidney Biopsy, Care After Refer to this sheet in the next few weeks. These instructions provide you with information on caring for yourself after your procedure. Your health care provider may also give you more specific instructions. Your treatment has been planned according to current medical practices, but problems sometimes occur. Call your health care provider if you have any problems or questions after your procedure.  WHAT TO EXPECT AFTER THE PROCEDURE   You may notice blood in the urine for the first 24 hours after the biopsy.  You may feel some pain at the biopsy site for 1-2 weeks after the biopsy. HOME CARE INSTRUCTIONS  Do not lift anything heavier than 10 lb (4.5 kg) for 2 weeks.  Do not take any non-steroidal anti-inflammatory drugs (NSAIDs) or any blood thinners for a week after the biopsy unless instructed to do so by your health care provider.  Only take medicines for pain, fever, or discomfort as directed by your health care provider. SEEK MEDICAL CARE IF:  You have bloody urine more than 24 hours after the biopsy.   You develop a fever.   You cannot urinate.   You have increasing pain at the biopsy site.  SEEK IMMEDIATE MEDICAL CARE IF: You feel faint or dizzy.  Document Released: 04/21/2013 Document Reviewed: 04/21/2013 [facility] Patient Information 2015 [place]. This information is not intended to replace advice given to you by your health care provider. Make sure you discuss any questions you have with your health care provider. Conscious Sedation, Adult, Care After Refer to this sheet in the next few weeks. These instructions provide you with information on caring for yourself after your procedure. Your health care provider may also give you more specific instructions. Your treatment has been planned according to current medical practices,  but problems sometimes occur. Call your health care provider if you have any problems or questions after your procedure. WHAT TO EXPECT AFTER THE PROCEDURE  After your procedure:  You may feel sleepy, clumsy, and have poor balance for several hours.  Vomiting may occur if you eat too soon after the procedure. HOME CARE INSTRUCTIONS  Do not participate in any activities where you could become injured for at least 24 hours. Do not:  Drive.  Swim.  Ride a bicycle.  Operate heavy machinery.  Cook.  Use power tools.  Climb ladders.  Work from a high place.  Do not make important decisions or sign legal documents until you are improved.  If you vomit, drink water, juice, or soup when you can drink without vomiting. Make sure you have little or no nausea before eating solid foods.  Only take over-the-counter or prescription medicines for pain, discomfort, or fever as directed by your health care provider.  Make sure you and your family fully understand everything about the medicines given to you, including what side effects may occur.  You should not drink alcohol, take sleeping pills, or take medicines that cause drowsiness for at least 24 hours.  If you smoke, do not smoke without supervision.  If you are feeling better, you may resume normal activities 24 hours after you were sedated.  Keep all appointments with your health care provider. SEEK MEDICAL CARE IF:  Your skin is pale or bluish in color.  You continue to feel nauseous or vomit.  Your pain is getting worse and  is not helped by medicine.  You have bleeding or swelling.  You are still sleepy or feeling clumsy after 24 hours. SEEK IMMEDIATE MEDICAL CARE IF:  You develop a rash.  You have difficulty breathing.  You develop any type of allergic problem.  You have a fever. MAKE SURE YOU:  Understand these instructions.  Will watch your condition.  Will get help right away if you are not doing well  or get worse. Document Released: 06/09/2013 Document Reviewed: 06/09/2013 [facility] Patient Information 2015 [place], [place]. This information is not intended to replace advice given to you by your health care provider. Make sure you discuss any questions you have with your health care provider.

## 2014-11-15 NOTE — Procedures (Signed)
Procedure:  Ultrasound guided biopsy of right renal mass Findings:  Complex cystic mass off lower pole of right kidney measures 3.2 cm. Single 18 G core biopsy via 17 G needle of complex area yielded very little tissue. Aspiration via 17 G needle yielded 13 mL of bloody fluid.  Sent for cytologic analysis. No complications.  EBL <25 mL.

## 2014-12-08 ENCOUNTER — Ambulatory Visit (INDEPENDENT_AMBULATORY_CARE_PROVIDER_SITE_OTHER): Payer: Medicare Other | Admitting: Podiatry

## 2014-12-08 ENCOUNTER — Encounter: Payer: Self-pay | Admitting: Podiatry

## 2014-12-08 VITALS — BP 137/76 | HR 61 | Resp 16

## 2014-12-08 DIAGNOSIS — M79606 Pain in leg, unspecified: Secondary | ICD-10-CM

## 2014-12-08 DIAGNOSIS — L600 Ingrowing nail: Secondary | ICD-10-CM | POA: Diagnosis not present

## 2014-12-08 DIAGNOSIS — B351 Tinea unguium: Secondary | ICD-10-CM

## 2014-12-08 NOTE — Patient Instructions (Signed)

## 2014-12-08 NOTE — Progress Notes (Signed)
   Subjective:    Patient ID: [name] [name], male    DOB: [birth year], 69 y.o.   MRN: [id number]  HPI Comments: "I need my feet checked"  Patient requesting his toenails be cut and calluses trimmed. The 5th toe right, lateral side, gets ingrown.      Review of Systems  All other systems reviewed and are negative.      Objective:   Physical Exam: I have reviewed his past medical history medications allergies surgery social history and review of systems. Pulses are strongly palpable bilateral. Her logic sensorium is intact bilateral. Deep tendon reflexes intact bilateral. Muscle strength +5 over 5 dorsiflexion plantar flexors and inverters everters all into the musculature is intact. Orthopedic evaluation restrict all joints distal to the ankle for range of motion without crepitus mild HAV for hammertoe deformity are noted bilaterally but are a symptomatic. Cutaneous evaluation demonstrates supple well-hydrated cutis sharply incurvated nail margin fifth digit of the right foot is painful on palpation as well as debridement. He also has thick yellow dystrophic nails that are painful. This could be fungal.        Assessment & Plan:  Assessment: Ingrown nail paronychia abscess fifth digit lateral border right. Pain in limb secondary to onychomycosis bilateral.  Plan: Chemical matrixectomy lateral border fifth digit right foot. He tolerated procedure well with local anesthesia. The nail border was removed and 3 applications of phenol were applied to the bed in the root. Neutral last with isopropyl alcohol Silvadene cream Without a dry sterile compressive dressing was then applied. He was given both oral and written home-going instructions for soaking of his toe. Prescription for Cortisporin Otic was dispensed. I debrided his nails 1 through 5 bilateral. Will follow up with him in 1 week for reevaluation.

## 2014-12-09 DIAGNOSIS — M542 Cervicalgia: Secondary | ICD-10-CM | POA: Insufficient documentation

## 2014-12-09 DIAGNOSIS — R202 Paresthesia of skin: Secondary | ICD-10-CM | POA: Insufficient documentation

## 2014-12-15 ENCOUNTER — Telehealth: Payer: Self-pay | Admitting: *Deleted

## 2014-12-15 NOTE — Telephone Encounter (Signed)
Pt request a callback to get appt time.

## 2014-12-20 ENCOUNTER — Encounter: Payer: Self-pay | Admitting: Podiatry

## 2014-12-20 ENCOUNTER — Ambulatory Visit (INDEPENDENT_AMBULATORY_CARE_PROVIDER_SITE_OTHER): Payer: Medicare Other | Admitting: Podiatry

## 2014-12-20 DIAGNOSIS — M79606 Pain in leg, unspecified: Secondary | ICD-10-CM

## 2014-12-20 DIAGNOSIS — L600 Ingrowing nail: Secondary | ICD-10-CM

## 2014-12-21 NOTE — Progress Notes (Signed)
He presents today after matrixectomy to the lateral aspect of the fifth digit of the right foot. He states he continues to soak twice daily.  Objective: Vital signs are stable he is alert and oriented 3. Pulses are strongly palpable right foot. No erythema edema saline as drainage or odor to the fifth digit of the right foot mild tenderness on palpation.  Assessment: Matrixectomy lateral aspect toenail fibular border fifth digit right foot.  Plan: Discontinue Betadine sterile with Epsom salts and warm water soaks covered during the day and leave open at night time. Continue Cortisporin Otic drops. Follow-up with me should this become increasingly painful or start to develop an infection.

## 2015-01-02 ENCOUNTER — Ambulatory Visit (INDEPENDENT_AMBULATORY_CARE_PROVIDER_SITE_OTHER): Payer: Medicare Other | Admitting: Podiatry

## 2015-01-02 ENCOUNTER — Encounter: Payer: Self-pay | Admitting: Podiatry

## 2015-01-02 VITALS — BP 136/85 | HR 65 | Resp 18

## 2015-01-02 DIAGNOSIS — L03031 Cellulitis of right toe: Secondary | ICD-10-CM

## 2015-01-02 DIAGNOSIS — L03011 Cellulitis of right finger: Secondary | ICD-10-CM

## 2015-01-02 MED ORDER — CLINDAMYCIN HCL 300 MG PO CAPS: 300.0000 mg | ORAL_CAPSULE | Freq: Three times a day (TID) | ORAL | Status: DC

## 2015-01-03 NOTE — Progress Notes (Signed)
Subjective:     Patient ID: [name] [name], male   DOB: 10-31-1945, 69 y.o.   MRN: [id number]  HPI patient presents stating my fifth toe right has some redness and I was just worried it might be infected. I still feel like they're something in there   Review of Systems     Objective:   Physical Exam Patient has crusted area on the lateral side fifth toe right that's localized in nature. No proximal edema erythema or drainage is noted    Assessment:     Possible mild local paronychia infection distal lateral right fifth toe    Plan:     Debride tissue did not note any drainage and flushed the area and applied sterile dressing. As precautionary measure placed on Cleocin 300 mg 3 times a day for 10 days and instructed on open toed shoes. If any redness swelling or drainage were to occur patient is to let us know immediately

## 2015-01-17 DIAGNOSIS — D472 Monoclonal gammopathy: Secondary | ICD-10-CM | POA: Insufficient documentation

## 2015-01-24 ENCOUNTER — Ambulatory Visit: Payer: Medicare Other | Admitting: Podiatry

## 2015-02-08 ENCOUNTER — Telehealth: Payer: Self-pay | Admitting: Cardiology

## 2015-02-08 NOTE — Telephone Encounter (Signed)
02/08/2015 Received faxed records on patient from [facility] for upcoming appointment with Dr. Percival Spanish on 03/13/2015 @ 11:30 am. Records given to [facility]. cbr

## 2015-03-09 ENCOUNTER — Ambulatory Visit (INDEPENDENT_AMBULATORY_CARE_PROVIDER_SITE_OTHER): Payer: Medicare Other | Admitting: Cardiology

## 2015-03-09 ENCOUNTER — Encounter: Payer: Self-pay | Admitting: Cardiology

## 2015-03-09 VITALS — BP 142/74 | HR 83 | Ht 68.0 in | Wt 241.0 lb

## 2015-03-09 DIAGNOSIS — R002 Palpitations: Secondary | ICD-10-CM

## 2015-03-09 DIAGNOSIS — I351 Nonrheumatic aortic (valve) insufficiency: Secondary | ICD-10-CM | POA: Diagnosis not present

## 2015-03-09 NOTE — Progress Notes (Signed)
HPI The patient presents for evaluation of various complaints including palpitations.  This is the third visit. He had a negative stress perfusion study in 2012 a mildly reduced ejection fraction. However, followup echo after the first visit with me demonstrated normal left ventricular function and no significant abnormalities. He wore a Holter monitor with no sustained dysrhythmias and no symptoms while wearing it.  He did have frequent ectopy particularly supraventricular on his monitor. He has ventricular ectopy as well. He had one episode of probable junctional or ventricular escape rhythm that was not symptomatic.  Since I saw him he has had problems with his prostate but no particular cardiac issues. He does occasionally have palpitations. He has vague episodes of what he describes as passing out for what is not clearly syncope. It happens when he sitting. He just seems to nod off and I think he might be falling asleep.  He's not had any falls with trauma. He's not had any other cardiac complaints such as chest discomfort or shortness of breath. He's had no new PND or orthopnea. He's walking for exercise at times.   Allergies  Allergen Reactions  . Amoxicillin Other (See Comments)    Can't move  . Penicillins Other (See Comments)    Can't move/dizziness    Current Outpatient Prescriptions  Medication Sig Dispense Refill  . amLODipine (NORVASC) 10 MG tablet Take 10 mg by mouth daily. May take twice a day as needed    . augmented betamethasone dipropionate (DIPROLENE AF) 0.05 % cream Apply topically 2 (two) times daily. 50 g 5  . azithromycin (ZITHROMAX) 250 MG tablet     . Cetirizine HCl (ZYRTEC ALLERGY) 10 MG CAPS Take 1 capsule (10 mg total) by mouth at bedtime. (Patient taking differently: Take 10 mg by mouth daily as needed (allergies). ) 30 capsule 1  . clindamycin (CLEOCIN) 300 MG capsule Take 1 capsule (300 mg total) by mouth 3 (three) times daily. 30 capsule 1  . fluticasone  (FLONASE) 50 MCG/ACT nasal spray 1 spray each nares bid 10 g 1  . KLOR-CON M10 10 MEQ tablet Take 10 mEq by mouth daily as needed (low potassium).     [name] KLOR-CON M20 20 MEQ tablet   5  . montelukast (SINGULAIR) 10 MG tablet Take 10 mg by mouth.    . montelukast (SINGULAIR) 10 MG tablet     . predniSONE (DELTASONE) 20 MG tablet     . triamcinolone cream (KENALOG) 0.1 % Apply 1 application topically 2 (two) times daily as needed (sores.).      No current facility-administered medications for this visit.    Past Medical History  Diagnosis Date  . Syncope   . Hypertension   . COPD (chronic obstructive pulmonary disease)   . Hyperlipidemia   . Renal cell carcinoma     I do not see confirmation of this diagnosis.  He sees a urologist currently.    . Diabetes mellitus type II     diet controlled    Past Surgical History  Procedure Laterality Date  . Spinal fixation surgery  1974     ROS:  Urinary incontinence.  As stated in the HPI and negative for all other systems.  PHYSICAL EXAM BP 142/74 mmHg  Pulse 83  Ht 5\' 8"  (1.727 m)  Wt 241 lb (109.317 kg)  BMI 36.65 kg/m2  GENERAL:  Well appearing HEENT:  Pupils equal round and reactive, fundi not visualized, oral mucosa unremarkable, proptosis, poor dentition NECK:  No jugular venous distention, waveform within normal limits, carotid upstroke brisk and symmetric, no bruits, no thyromegaly LUNGS:  Clear to auscultation bilaterally CHEST:  Unremarkable HEART:  PMI not displaced or sustained,S1 and S2 within normal limits, no S3, no S4, no clicks, no rubs, diastolic murmur at the left 3rd intercostal space, short systolic murmur ABD:  Flat, positive bowel sounds normal in frequency in pitch, no bruits, no rebound, no guarding, no midline pulsatile mass, no hepatomegaly, no splenomegaly EXT:  2 plus pulses throughout, trace leg edema, no cyanosis no clubbing    ASSESSMENT AND PLAN  PALPITATIONS:  These are not problematic.  No  change in therapy is needed.   AI:  This was mild.  I will repeat an echo in [place]:  His EF was improved on the recent echo. No further evaluation is warranted. He had a negative stress perfusion study in 2012 and has no ongoing anginal symptoms.    HTN:  His blood pressure is well controlled. She will continue the meds as listed.

## 2015-03-09 NOTE — Patient Instructions (Signed)
Your physician wants you to follow-up in: 1 Year. You will receive a reminder letter in the mail two months in advance. If you don't receive a letter, please call our office to schedule the follow-up appointment.  Your physician has requested that you have an echocardiogram. Echocardiography is a painless test that uses sound waves to create images of your heart. It provides your doctor with information about the size and shape of your heart and how well your heart's chambers and valves are working. This procedure takes approximately one hour. There are no restrictions for this procedure. In November

## 2015-03-11 ENCOUNTER — Emergency Department (HOSPITAL_COMMUNITY)
Admission: EM | Admit: 2015-03-11 | Discharge: 2015-03-11 | Disposition: A | Discharge status: Home / Self Care | Payer: Medicare Other | Attending: Emergency Medicine | Admitting: Emergency Medicine

## 2015-03-11 ENCOUNTER — Emergency Department (HOSPITAL_COMMUNITY): Payer: Medicare Other

## 2015-03-11 ENCOUNTER — Encounter (HOSPITAL_COMMUNITY): Payer: Self-pay | Admitting: Emergency Medicine

## 2015-03-11 DIAGNOSIS — Z8639 Personal history of other endocrine, nutritional and metabolic disease: Secondary | ICD-10-CM | POA: Diagnosis not present

## 2015-03-11 DIAGNOSIS — Z79899 Other long term (current) drug therapy: Secondary | ICD-10-CM | POA: Insufficient documentation

## 2015-03-11 DIAGNOSIS — J449 Chronic obstructive pulmonary disease, unspecified: Secondary | ICD-10-CM

## 2015-03-11 DIAGNOSIS — I10 Essential (primary) hypertension: Secondary | ICD-10-CM | POA: Diagnosis not present

## 2015-03-11 DIAGNOSIS — R0981 Nasal congestion: Secondary | ICD-10-CM | POA: Diagnosis present

## 2015-03-11 DIAGNOSIS — Z880 Allergy status to penicillin: Secondary | ICD-10-CM | POA: Insufficient documentation

## 2015-03-11 DIAGNOSIS — J441 Chronic obstructive pulmonary disease with (acute) exacerbation: Secondary | ICD-10-CM | POA: Insufficient documentation

## 2015-03-11 DIAGNOSIS — J069 Acute upper respiratory infection, unspecified: Secondary | ICD-10-CM | POA: Insufficient documentation

## 2015-03-11 LAB — CBC WITH DIFFERENTIAL/PLATELET
BASOS PCT: 0 % (ref 0–1)
Basophils Absolute: 0 10*3/uL (ref 0.0–0.1)
EOS ABS: 0.3 10*3/uL (ref 0.0–0.7)
Eosinophils Relative: 3 % (ref 0–5)
HEMATOCRIT: 38.6 % — AB (ref 39.0–52.0)
Hemoglobin: 12.8 g/dL — ABNORMAL LOW (ref 13.0–17.0)
Lymphocytes Relative: 28 % (ref 12–46)
Lymphs Abs: 2.3 10*3/uL (ref 0.7–4.0)
MCH: 30.3 pg (ref 26.0–34.0)
MCHC: 33.2 g/dL (ref 30.0–36.0)
MCV: 91.5 fL (ref 78.0–100.0)
MONO ABS: 0.6 10*3/uL (ref 0.1–1.0)
Monocytes Relative: 7 % (ref 3–12)
Neutro Abs: 5 10*3/uL (ref 1.7–7.7)
Neutrophils Relative %: 62 % (ref 43–77)
Platelets: 255 10*3/uL (ref 150–400)
RBC: 4.22 MIL/uL (ref 4.22–5.81)
RDW: 13.2 % (ref 11.5–15.5)
WBC: 8.2 10*3/uL (ref 4.0–10.5)

## 2015-03-11 LAB — COMPREHENSIVE METABOLIC PANEL
ALK PHOS: 101 U/L (ref 38–126)
ALT: 25 U/L (ref 17–63)
ANION GAP: 7 (ref 5–15)
AST: 36 U/L (ref 15–41)
Albumin: 3.4 g/dL — ABNORMAL LOW (ref 3.5–5.0)
BILIRUBIN TOTAL: 0.7 mg/dL (ref 0.3–1.2)
BUN: 20 mg/dL (ref 6–20)
CHLORIDE: 99 mmol/L — AB (ref 101–111)
CO2: 30 mmol/L (ref 22–32)
Calcium: 8.9 mg/dL (ref 8.9–10.3)
Creatinine, Ser: 1.03 mg/dL (ref 0.61–1.24)
GFR calc Af Amer: >60 mL/min (ref >60)
GFR calc non Af Amer: >60 mL/min (ref >60)
Glucose, Bld: 104 mg/dL — ABNORMAL HIGH (ref 65–99)
POTASSIUM: 3.2 mmol/L — AB (ref 3.5–5.1)
Sodium: 136 mmol/L (ref 135–145)
TOTAL PROTEIN: 8.3 g/dL — AB (ref 6.5–8.1)

## 2015-03-11 LAB — BRAIN NATRIURETIC PEPTIDE: B Natriuretic Peptide: 145.1 pg/mL — ABNORMAL HIGH (ref 0.0–100.0)

## 2015-03-11 LAB — TROPONIN I

## 2015-03-11 MED ORDER — IPRATROPIUM-ALBUTEROL 0.5-2.5 (3) MG/3ML IN SOLN
3.0000 mL | Freq: Once | RESPIRATORY_TRACT | Status: AC
  Administered 2015-03-11: 3 mL via RESPIRATORY_TRACT
  Filled 2015-03-11: qty 3

## 2015-03-11 MED ORDER — AZITHROMYCIN 250 MG PO TABS: 250.0000 mg | ORAL_TABLET | Freq: Every day | ORAL | Status: DC

## 2015-03-11 MED ORDER — ALBUTEROL SULFATE HFA 108 (90 BASE) MCG/ACT IN AERS: 1.0000 | INHALATION_SPRAY | Freq: Four times a day (QID) | RESPIRATORY_TRACT | Status: DC | PRN

## 2015-03-11 MED ORDER — ALBUTEROL SULFATE HFA 108 (90 BASE) MCG/ACT IN AERS
2.0000 | INHALATION_SPRAY | Freq: Once | RESPIRATORY_TRACT | Status: AC
  Administered 2015-03-11: 2 via RESPIRATORY_TRACT
  Filled 2015-03-11: qty 6.7

## 2015-03-11 NOTE — ED Provider Notes (Signed)
CSN: [id number]     Arrival date & time 03/11/15  1700 History   First MD Initiated Contact with Patient 03/11/15 1735     Chief Complaint  Patient presents with  . Nasal Congestion  . Cough     (Consider location/radiation/quality/duration/timing/severity/associated sxs/prior Treatment) The history is provided by the patient.    Pt with hx COPD, HTN, HLD, DM p/w several weeks of nasal congestion, cough, postnasal drip, SOB.  Denies fevers, chest pain, leg swelling, hemoptysis.  Pt apparently had choking episode today but when asked about this he notes he has occasional problems with this, particularly with drinking water first thing in the morning.  Currently he is having no difficulty swallowing or breathing.  His primary concern seems to be his postnasal drip and nasal congestion.    Past Medical History  Diagnosis Date  . Syncope   . Hypertension   . COPD (chronic obstructive pulmonary disease)   . Hyperlipidemia   . Renal cell carcinoma     I do not see confirmation of this diagnosis.  He sees a urologist currently.    . Diabetes mellitus type II     diet controlled   Past Surgical History  Procedure Laterality Date  . Spinal fixation surgery  1974   Family History  Problem Relation Age of Onset  . Heart disease Mother     No details.  Still alive age 66  . Emphysema Father   . Asthma Father    History  Substance Use Topics  . Smoking status: Never Smoker   . Smokeless tobacco: Not on file  . Alcohol Use: No    Review of Systems  All other systems reviewed and are negative.     Allergies  Penicillins  Home Medications   Prior to Admission medications   Medication Sig Start Date End Date Taking? Authorizing Provider  amLODipine (NORVASC) 10 MG tablet Take 10 mg by mouth daily. May take twice a day as needed 12/25/11  Yes Historical Provider, MD  KLOR-CON M20 20 MEQ tablet Take 20 mEq by mouth daily as needed (low potassium).  11/25/14  Yes Historical  Provider, MD  montelukast (SINGULAIR) 10 MG tablet Take 10 mg by mouth daily. 01/15/15  Yes Historical Provider, MD  Cetirizine HCl (ZYRTEC ALLERGY) 10 MG CAPS Take 1 capsule (10 mg total) by mouth at bedtime. Patient not taking: Reported on 03/11/2015 07/13/14   Tanna Furry, MD   BP 131/74 mmHg  Pulse 55  Temp(Src) 98.2 F (36.8 C) (Oral)  Resp 18  SpO2 96% Physical Exam  Constitutional: He appears well-developed and well-nourished. No distress.  HENT:  Head: Normocephalic and atraumatic.  Nose: Mucosal edema present. Right sinus exhibits no maxillary sinus tenderness and no frontal sinus tenderness. Left sinus exhibits no maxillary sinus tenderness and no frontal sinus tenderness.  Mouth/Throat: Oropharynx is clear and moist. No oropharyngeal exudate.  Neck: Normal range of motion. Neck supple.  Cardiovascular: Normal rate and regular rhythm.   Pulmonary/Chest: Effort normal and breath sounds normal. No stridor. No respiratory distress. He has no wheezes. He has no rales.  Abdominal: Soft. He exhibits no distension and no mass. There is no tenderness. There is no rebound and no guarding.  Musculoskeletal: He exhibits no edema.  Neurological: He is alert. He exhibits normal muscle tone.  Skin: He is not diaphoretic.  Nursing note and vitals reviewed.   ED Course  Procedures (including critical care time) Labs Review Labs Reviewed  CBC WITH DIFFERENTIAL/PLATELET -  Abnormal; Notable for the following:    Hemoglobin 12.8 (*)    HCT 38.6 (*)    All other components within normal limits  COMPREHENSIVE METABOLIC PANEL  BRAIN NATRIURETIC PEPTIDE  TROPONIN I    Imaging Review Dg Chest 2 View  03/11/2015   CLINICAL DATA:  Shortness of breath and productive cough for a few months.  EXAM: CHEST  2 VIEW  COMPARISON:  09/02/2014  FINDINGS: The heart is enlarged but stable. There is tortuosity, ectasia and calcification of the thoracic aorta. The lungs are clear of acute process. Rounded  densities at both lung bases appear stable and likely represent nipple shadows. No pleural effusions. The bony thorax is intact.  IMPRESSION: Stable cardiac enlargement.  No acute pulmonary findings.   Electronically Signed   By: Marijo Sanes M.D.   On: 03/11/2015 18:51     EKG Interpretation None       ED ECG REPORT   Date: 03/11/2015  Rate: 55  Rhythm: normal sinus rhythm  QRS Axis: left  Intervals: normal  ST/T Wave abnormalities: nonspecific T wave changes  Conduction Disutrbances:nonspecific intraventricular conduction delay  Narrative Interpretation:   Old EKG Reviewed: none available  I have personally reviewed the EKG tracing and agree with the computerized printout as noted.    Pt also seen by Dr Mingo Amber.   MDM   Final diagnoses:  URI (upper respiratory infection)  Chronic obstructive pulmonary disease, unspecified COPD, unspecified chronic bronchitis type    Afebrile, nontoxic patient with several weeks of nasal congestion, postnasal drip, cough, SOB in the setting of COPD.  Exam unremarkable.  Labs unremarkable. CXR negative.  EKG not ischemic.  Pt given nebs in ED with improvement.  He does not have the tubing or mask at home and is out of his albuterol inhaler.   D/C home with these supplies, albuterol, z-pak, PCP follow up.  Discussed result, findings, treatment, and follow up  with patient.  Pt given return precautions.  Pt verbalizes understanding and agrees with plan.         Clayton Bibles, PA-C 03/12/15 [id number]  Evelina Bucy, MD 03/12/15 2052

## 2015-03-11 NOTE — ED Notes (Signed)
Pt from home reports nasal congestion and productive cough for " a few months". Pt adds that he was eating a chicken leg, passed out and woke up choking today PTA. Pt reports that he did not hit his head because he was sitting in a chair, but sts "I may have been sleeping." Pt is A&O and in NAD

## 2015-03-11 NOTE — Discharge Instructions (Signed)
Read the information below.  Use the prescribed medication as directed.  Please discuss all new medications with your pharmacist.  You may return to the Emergency Department at any time for worsening condition or any new symptoms that concern you.  If you develop high fevers that do not resolve with tylenol or ibuprofen, you have difficulty swallowing or breathing, or you are unable to tolerate fluids by mouth, return to the ER for a recheck.       Chronic Obstructive Pulmonary Disease Chronic obstructive pulmonary disease (COPD) is a common lung problem. In COPD, the flow of air from the lungs is limited. The way your lungs work will probably never return to normal, but there are things you can do to improve your lungs and make yourself feel better. HOME CARE  Take all medicines as told by your doctor.  Avoid medicines or cough syrups that dry up your airway (such as antihistamines) and do not allow you to get rid of thick spit. You do not need to avoid them if told differently by your doctor.  If you smoke, stop. Smoking makes the problem worse.  Avoid being around things that make your breathing worse (like smoke, chemicals, and fumes).  Use oxygen therapy and therapy to help improve your lungs (pulmonary rehabilitation) if told by your doctor. If you need home oxygen therapy, ask your doctor if you should buy a tool to measure your oxygen level (oximeter).  Avoid people who have a sickness you can catch (contagious).  Avoid going outside when it is very hot, cold, or humid.  Eat healthy foods. Eat smaller meals more often. Rest before meals.  Stay active, but remember to also rest.  Make sure to get all the shots (vaccines) your doctor recommends. Ask your doctor if you need a pneumonia shot.  Learn and use tips on how to relax.  Learn and use tips on how to control your breathing as told by your doctor. Try:  Breathing in (inhaling) through your nose for 1 second. Then, pucker  your lips and breath out (exhale) through your lips for 2 seconds.  Putting one hand on your belly (abdomen). Breathe in slowly through your nose for 1 second. Your hand on your belly should move out. Pucker your lips and breathe out slowly through your lips. Your hand on your belly should move in as you breathe out.  Learn and use controlled coughing to clear thick spit from your lungs. The steps are:  Lean your head a little forward.  Breathe in deeply.  Try to hold your breath for 3 seconds.  Keep your mouth slightly open while coughing 2 times.  Spit any thick spit out into a tissue.  Rest and do the steps again 1 or 2 times as needed. GET HELP IF:  You cough up more thick spit than usual.  There is a change in the color or thickness of the spit.  It is harder to breathe than usual.  Your breathing is faster than usual. GET HELP RIGHT AWAY IF:   You have shortness of breath while resting.  You have shortness of breath that stops you from:  Being able to talk.  Doing normal activities.  You chest hurts for longer than 5 minutes.  Your skin color is more blue than usual.  Your pulse oximeter shows that you have low oxygen for longer than 5 minutes. MAKE SURE YOU:   Understand these instructions.  Will watch your condition.  Will get  help right away if you are not doing well or get worse. Document Released: 02/05/2008 Document Revised: 01/03/2014 Document Reviewed: 04/15/2013 [facility] Patient Information 2015 [place], [place]. This information is not intended to replace advice given to you by your health care provider. Make sure you discuss any questions you have with your health care provider.  Upper Respiratory Infection, Adult An upper respiratory infection (URI) is also known as the common cold. It is often caused by a type of germ (virus). Colds are easily spread (contagious). You can pass it to others by kissing, coughing, sneezing, or drinking out of the  same glass. Usually, you get better in 1 or 2 weeks.  HOME CARE   Only take medicine as told by your doctor.  Use a warm mist humidifier or breathe in steam from a hot shower.  Drink enough water and fluids to keep your pee (urine) clear or pale yellow.  Get plenty of rest.  Return to work when your temperature is back to normal or as told by your doctor. You may use a face mask and wash your hands to stop your cold from spreading. GET HELP RIGHT AWAY IF:   After the first few days, you feel you are getting worse.  You have questions about your medicine.  You have chills, shortness of breath, or brown or red spit (mucus).  You have yellow or brown snot (nasal discharge) or pain in the face, especially when you bend forward.  You have a fever, puffy (swollen) neck, pain when you swallow, or white spots in the back of your throat.  You have a bad headache, ear pain, sinus pain, or chest pain.  You have a high-pitched whistling sound when you breathe in and out (wheezing).  You have a lasting cough or cough up blood.  You have sore muscles or a stiff neck. MAKE SURE YOU:   Understand these instructions.  Will watch your condition.  Will get help right away if you are not doing well or get worse. Document Released: 02/05/2008 Document Revised: 11/11/2011 Document Reviewed: 11/24/2013 [facility] Patient Information 2015 [place], [place]. This information is not intended to replace advice given to you by your health care provider. Make sure you discuss any questions you have with your health care provider.

## 2015-03-11 NOTE — ED Notes (Signed)
Pt ambulating independently w/ steady gait on d/c in no acute distress, A&Ox4. D/c instructions reviewed w/ pt and family - pt and family deny any further questions or concerns at present. Rx given x2  

## 2015-03-13 ENCOUNTER — Ambulatory Visit: Payer: Medicare Other | Admitting: Cardiology

## 2015-03-17 ENCOUNTER — Ambulatory Visit (HOSPITAL_BASED_OUTPATIENT_CLINIC_OR_DEPARTMENT_OTHER): Payer: Medicare Other | Attending: Otolaryngology | Admitting: *Deleted

## 2015-03-17 DIAGNOSIS — R351 Nocturia: Secondary | ICD-10-CM | POA: Diagnosis not present

## 2015-03-17 DIAGNOSIS — I493 Ventricular premature depolarization: Secondary | ICD-10-CM | POA: Diagnosis not present

## 2015-03-17 DIAGNOSIS — G4700 Insomnia, unspecified: Secondary | ICD-10-CM | POA: Insufficient documentation

## 2015-03-17 DIAGNOSIS — G4733 Obstructive sleep apnea (adult) (pediatric): Secondary | ICD-10-CM | POA: Insufficient documentation

## 2015-03-17 DIAGNOSIS — R0683 Snoring: Secondary | ICD-10-CM | POA: Diagnosis not present

## 2015-03-17 DIAGNOSIS — R05 Cough: Secondary | ICD-10-CM | POA: Diagnosis not present

## 2015-03-25 ENCOUNTER — Ambulatory Visit (HOSPITAL_BASED_OUTPATIENT_CLINIC_OR_DEPARTMENT_OTHER): Payer: Medicare Other | Admitting: Internal Medicine

## 2015-03-25 DIAGNOSIS — G4733 Obstructive sleep apnea (adult) (pediatric): Secondary | ICD-10-CM

## 2015-03-25 DIAGNOSIS — R0683 Snoring: Secondary | ICD-10-CM

## 2015-03-25 NOTE — Progress Notes (Signed)
    Patient Name: [name] [name], [name] [name] Date: 03/17/2015 Gender: Male D.O.B: [birth year] Age (years): 60 Referring Provider: Melida Quitter Height (inches): 79 Interpreting Physician: Baird Lyons MD, ABSM Weight (lbs): 238 RPSGT: Gerhard Perches BMI: 36 MRN: [id number] Neck Size: 16.50 CLINICAL INFORMATION Sleep Study Type: NPSG  Indication for sleep study: OSA, Snoring  Epworth Sleepiness Score: 13  SLEEP STUDY TECHNIQUE As per the AASM Manual for the Scoring of Sleep and Associated Events v2.3 (April 2016) with a hypopnea requiring 4% desaturations.  The channels recorded and monitored were frontal, central and occipital EEG, electrooculogram (EOG), submentalis EMG (chin), nasal and oral airflow, thoracic and abdominal wall motion, anterior tibialis EMG, snore microphone, electrocardiogram, and pulse oximetry.  MEDICATIONS Patient's medications include: Charted for reeview. Medications self-administered by patient during sleep study : No sleep medicine administered.  SLEEP ARCHITECTURE The study was initiated at 10:29:01 PM and ended at 4:50:54 AM.  Sleep onset time was 27.8 minutes and the sleep efficiency was 60.6%. The total sleep time was 231.6 minutes.  Stage REM latency was 267.5 minutes.  The patient spent 12.52% of the night in stage N1 sleep, 76.04% in stage N2 sleep, 0.00% in stage N3 and 11.44% in REM.  Alpha intrusion was absent.  Supine sleep was 96.33%.  Awake after sleep onset 122.5 minutes  RESPIRATORY PARAMETERS The overall apnea/hypopnea index (AHI) was 9.1 per hour. There were 3 total apneas, including 3 obstructive, 0 central and 0 mixed apneas. There were 32 hypopneas and 6 RERAs.  The AHI during Stage REM sleep was 49.8 per hour.  AHI while supine was 9.4 per hour.  The mean oxygen saturation was 92.44%. The minimum SpO2 during sleep was 92.4%.  Moderate snoring was noted during this study.  CARDIAC DATA The 2 lead EKG demonstrated sinus  rhythm. The mean heart rate was N/A beats per minute. Other EKG findings include: PVCs with ventricular bigeminy.  LEG MOVEMENT DATA The total PLMS were 12 with a resulting PLMS index of 3.11. Associated arousal with leg movement index was 0.3 .  IMPRESSIONS Mild obstructive sleep apnea occurred during this study (AHI = 9.1/h). There were not enough early events to meet protocol requirements for split CPAP titration No significant central sleep apnea occurred during this study (CAI = 0.0/h). Slight oxygen desaturation was noted during this study (Min O2 = 92.4%). The patient snored with Moderate snoring volume. Difficulty initiating and maintaining sleep. Frequent cough. Nocturia x 4. EKG findings include PVCs with ventricular bigeminy. Clinically significant periodic limb movements did not occur during sleep. No significant associated arousals.  DIAGNOSIS Obstructive Sleep Apnea (327.23 [G47.33 ICD-10]) Insomnia  RECOMMENDATIONS If conservative measures are insufficient, then CPAP or an oral appliance might be among considered alternatives based on clinical judgment. Therapeutic attention to cough and to frequent nocturia Additional therapy for insomnia might be appropriate Positional therapy avoiding supine position during sleep. Avoid alcohol, sedatives and other CNS depressants that may worsen sleep apnea and disrupt normal sleep architecture. Sleep hygiene should be reviewed to assess factors that may improve sleep quality. Weight management and regular exercise should be initiated or continued if appropriate.   Deneise Lever Diplomate, American Board of Sleep Medicine  ELECTRONICALLY SIGNED ON:  03/25/2015, 11:22 AM [place] PH: [phone]   FX: [phone] [place]

## 2015-03-30 ENCOUNTER — Ambulatory Visit: Payer: Medicare Other | Admitting: Podiatry

## 2015-04-25 ENCOUNTER — Ambulatory Visit: Payer: Medicare Other | Admitting: Cardiology

## 2015-06-02 ENCOUNTER — Encounter (HOSPITAL_COMMUNITY): Payer: Self-pay | Admitting: Emergency Medicine

## 2015-06-02 ENCOUNTER — Emergency Department (HOSPITAL_COMMUNITY): Payer: Medicare Other

## 2015-06-02 ENCOUNTER — Emergency Department (HOSPITAL_COMMUNITY)
Admission: EM | Admit: 2015-06-02 | Discharge: 2015-06-02 | Disposition: A | Discharge status: Home / Self Care | Payer: Medicare Other | Attending: Emergency Medicine | Admitting: Emergency Medicine

## 2015-06-02 DIAGNOSIS — R002 Palpitations: Secondary | ICD-10-CM | POA: Diagnosis not present

## 2015-06-02 DIAGNOSIS — J0181 Other acute recurrent sinusitis: Secondary | ICD-10-CM | POA: Diagnosis not present

## 2015-06-02 DIAGNOSIS — Z85528 Personal history of other malignant neoplasm of kidney: Secondary | ICD-10-CM | POA: Insufficient documentation

## 2015-06-02 DIAGNOSIS — E119 Type 2 diabetes mellitus without complications: Secondary | ICD-10-CM | POA: Insufficient documentation

## 2015-06-02 DIAGNOSIS — J441 Chronic obstructive pulmonary disease with (acute) exacerbation: Secondary | ICD-10-CM | POA: Insufficient documentation

## 2015-06-02 DIAGNOSIS — Z79899 Other long term (current) drug therapy: Secondary | ICD-10-CM | POA: Insufficient documentation

## 2015-06-02 DIAGNOSIS — R0602 Shortness of breath: Secondary | ICD-10-CM

## 2015-06-02 DIAGNOSIS — R079 Chest pain, unspecified: Secondary | ICD-10-CM | POA: Diagnosis not present

## 2015-06-02 DIAGNOSIS — I509 Heart failure, unspecified: Secondary | ICD-10-CM | POA: Diagnosis not present

## 2015-06-02 DIAGNOSIS — J3489 Other specified disorders of nose and nasal sinuses: Secondary | ICD-10-CM | POA: Diagnosis present

## 2015-06-02 DIAGNOSIS — Z880 Allergy status to penicillin: Secondary | ICD-10-CM | POA: Diagnosis not present

## 2015-06-02 DIAGNOSIS — I10 Essential (primary) hypertension: Secondary | ICD-10-CM | POA: Diagnosis not present

## 2015-06-02 LAB — BASIC METABOLIC PANEL
Anion gap: 5 (ref 5–15)
BUN: 10 mg/dL (ref 6–20)
CO2: 31 mmol/L (ref 22–32)
Calcium: 8.7 mg/dL — ABNORMAL LOW (ref 8.9–10.3)
Chloride: 102 mmol/L (ref 101–111)
Creatinine, Ser: 0.8 mg/dL (ref 0.61–1.24)
GFR calc Af Amer: >60 mL/min (ref >60)
GLUCOSE: 102 mg/dL — AB (ref 65–99)
Potassium: 3.4 mmol/L — ABNORMAL LOW (ref 3.5–5.1)
SODIUM: 138 mmol/L (ref 135–145)

## 2015-06-02 LAB — CBC WITH DIFFERENTIAL/PLATELET
Basophils Absolute: 0 10*3/uL (ref 0.0–0.1)
Basophils Relative: 1 %
EOS ABS: 0.2 10*3/uL (ref 0.0–0.7)
EOS PCT: 3 %
HCT: 37.5 % — ABNORMAL LOW (ref 39.0–52.0)
Hemoglobin: 12.2 g/dL — ABNORMAL LOW (ref 13.0–17.0)
LYMPHS PCT: 26 %
Lymphs Abs: 1.9 10*3/uL (ref 0.7–4.0)
MCH: 30 pg (ref 26.0–34.0)
MCHC: 32.5 g/dL (ref 30.0–36.0)
MCV: 92.4 fL (ref 78.0–100.0)
MONO ABS: 0.6 10*3/uL (ref 0.1–1.0)
MONOS PCT: 9 %
Neutro Abs: 4.5 10*3/uL (ref 1.7–7.7)
Neutrophils Relative %: 61 %
PLATELETS: 246 10*3/uL (ref 150–400)
RBC: 4.06 MIL/uL — ABNORMAL LOW (ref 4.22–5.81)
RDW: 13.3 % (ref 11.5–15.5)
WBC: 7.2 10*3/uL (ref 4.0–10.5)

## 2015-06-02 LAB — BRAIN NATRIURETIC PEPTIDE: B Natriuretic Peptide: 30.8 pg/mL (ref 0.0–100.0)

## 2015-06-02 MED ORDER — DOXYCYCLINE HYCLATE 100 MG PO TABS
100.0000 mg | ORAL_TABLET | Freq: Once | ORAL | Status: AC
  Administered 2015-06-02: 100 mg via ORAL
  Filled 2015-06-02: qty 1

## 2015-06-02 MED ORDER — ALBUTEROL SULFATE (5 MG/ML) 0.5% IN NEBU: 2.5000 mg | INHALATION_SOLUTION | Freq: Four times a day (QID) | RESPIRATORY_TRACT | Status: DC | PRN

## 2015-06-02 MED ORDER — DOXYCYCLINE HYCLATE 100 MG PO CAPS: 100.0000 mg | ORAL_CAPSULE | Freq: Two times a day (BID) | ORAL | Status: DC

## 2015-06-02 MED ORDER — ALBUTEROL SULFATE (2.5 MG/3ML) 0.083% IN NEBU
5.0000 mg | INHALATION_SOLUTION | Freq: Once | RESPIRATORY_TRACT | Status: AC
  Administered 2015-06-02: 5 mg via RESPIRATORY_TRACT
  Filled 2015-06-02: qty 6

## 2015-06-02 NOTE — ED Provider Notes (Signed)
CSN: [id number]     Arrival date & time 06/02/15  1152 History   First MD Initiated Contact with Patient 06/02/15 1205     Chief Complaint  Patient presents with  . sinus drainage      HPI Comments: [name] [name] is a 69 y.o. Male who presents with complaint of shortness of breath and chest pain for the past two weeks. Patient states he usually walks around his neighborhood everyday, but for the past two weeks has been unable to accomplish because he feels like he can't catch his breath. Pt also complains of shortness of breath and chest pain at rest.  He denies any fever/chills, N/V, or any ankle edema. Pt also complains of sinus pressure and congestion for the past two weeks as well.  Pt states he has a known nasal passage abnormality causing a blockage to air flow, but has not gone to get the corrective surgery needed.   Past Medical History  Diagnosis Date  . Syncope   . Hypertension   . COPD (chronic obstructive pulmonary disease)   . Hyperlipidemia   . Renal cell carcinoma     I do not see confirmation of this diagnosis.  He sees a urologist currently.    . Diabetes mellitus type II     diet controlled   Past Surgical History  Procedure Laterality Date  . Spinal fixation surgery  1974   Family History  Problem Relation Age of Onset  . Heart disease Mother     No details.  Still alive age 58  . Emphysema Father   . Asthma Father    Social History  Substance Use Topics  . Smoking status: Never Smoker   . Smokeless tobacco: None  . Alcohol Use: No    Review of Systems  Constitutional: Negative for fever, chills and unexpected weight change.  HENT: Positive for congestion and sinus pressure. Negative for trouble swallowing.   Respiratory: Positive for shortness of breath.   Cardiovascular: Positive for chest pain and palpitations. Negative for leg swelling.  Gastrointestinal: Negative for nausea, vomiting, abdominal pain, diarrhea and abdominal distention.  Endocrine:  Negative for cold intolerance and heat intolerance.  Musculoskeletal: Negative for back pain and arthralgias.  Neurological: Negative for dizziness, syncope, weakness, light-headedness and numbness.      Allergies  Amoxicillin and Penicillins  Home Medications   Prior to Admission medications   Medication Sig Start Date End Date Taking? Authorizing Provider  albuterol (PROVENTIL HFA;VENTOLIN HFA) 108 (90 BASE) MCG/ACT inhaler Inhale 1-2 puffs into the lungs every 6 (six) hours as needed for wheezing or shortness of breath. 03/11/15  Yes Clayton Bibles, PA-C  amLODipine (NORVASC) 10 MG tablet Take 10 mg by mouth 2 (two) times daily.  12/25/11  Yes Historical Provider, MD  KLOR-CON M20 20 MEQ tablet Take 20 mEq by mouth daily.  11/25/14  Yes Historical Provider, MD  montelukast (SINGULAIR) 10 MG tablet Take 10 mg by mouth daily. 01/15/15  Yes Historical Provider, MD  tamsulosin (FLOMAX) 0.4 MG CAPS capsule Take 0.8 mg by mouth daily.   Yes Historical Provider, MD  albuterol (PROVENTIL) (5 MG/ML) 0.5% nebulizer solution Take 0.5 mLs (2.5 mg total) by nebulization every 6 (six) hours as needed for wheezing or shortness of breath. 06/02/15   Shawn C Joy, PA-C  doxycycline (VIBRAMYCIN) 100 MG capsule Take 1 capsule (100 mg total) by mouth 2 (two) times daily. 06/02/15   Shawn C Joy, PA-C   BP 132/87 mmHg  Pulse 72  Temp(Src) 98.1 F (36.7 C) (Oral)  Resp 16  SpO2 95% Physical Exam  Constitutional: He is oriented to person, place, and time. He appears well-developed and well-nourished. No distress.  HENT:  Head: Normocephalic and atraumatic.  Eyes: Conjunctivae and EOM are normal. Pupils are equal, round, and reactive to light.  Cardiovascular: Normal rate, regular rhythm and normal heart sounds.   Pulmonary/Chest: Effort normal. No accessory muscle usage. No tachypnea. No respiratory distress. He has wheezes in the right lower field and the left lower field.  Expiratory wheezes  Abdominal: Soft.  Bowel sounds are normal.  Musculoskeletal: He exhibits no edema or tenderness.  Neurological: He is alert and oriented to person, place, and time.  Skin: Skin is warm and dry. He is not diaphoretic.  Nursing note and vitals reviewed.   ED Course  Procedures (including critical care time) Labs Review Labs Reviewed  CBC WITH DIFFERENTIAL/PLATELET - Abnormal; Notable for the following:    RBC 4.06 (*)    Hemoglobin 12.2 (*)    HCT 37.5 (*)    All other components within normal limits  BASIC METABOLIC PANEL - Abnormal; Notable for the following:    Potassium 3.4 (*)    Glucose, Bld 102 (*)    Calcium 8.7 (*)    All other components within normal limits  BRAIN NATRIURETIC PEPTIDE    Imaging Review Dg Chest 2 View  06/02/2015   CLINICAL DATA:  Shortness of Breath. Cough for 2 months. Chest tightness mid chest region. Hypertension.  EXAM: CHEST  2 VIEW  COMPARISON:  March 11, 2015  FINDINGS: There is an apparent nipple shadow on the left, stable. There is no edema or consolidation. Heart is enlarged with pulmonary vascular within normal limits, stable. Thoracic aorta is mildly prominent but stable. No adenopathy. No bone lesions.  IMPRESSION: Stable cardiac prominence. Mild aortic prominence is likely due to chronic hypertension. No edema or consolidation. Probable nipple shadow left base; it may be reasonable to obtain repeat study with nipple markers to confirm.   Electronically Signed   By: Lowella Grip III M.D.   On: 06/02/2015 13:09   I have personally reviewed and evaluated these images and lab results as part of my medical decision-making.   EKG Interpretation   Date/Time:  Friday June 02 2015 12:19:26 EDT Ventricular Rate:  54 PR Interval:  173 QRS Duration: 115 QT Interval:  449 QTC Calculation: 425 R Axis:   -42 Text Interpretation:  Sinus rhythm Atrial premature complex Nonspecific  IVCD with LAD Abnormal T, consider ischemia, diffuse leads since last  tracing no  significant change Confirmed by MILLER  MD, BRIAN ([id number]) on  06/02/2015 12:46:45 PM      MDM   Final diagnoses:  Shortness of breath  Other acute recurrent sinusitis    [name] [name] presents today with shortness of breath, chest pain and sinus congestion.  Findings and plan of care discussed with Dr. Sabra Heck.  Patient to receive cardiac/shortness of breath work up along with antibiotics for sinus infection. Antibiotic given will be Doxycycline due to patient's Amoxicillin and Penicillin allergies.  CBC shows Hgb of 12.2, but this is consistent with previous lab findings.  Patient voices improvement following albuterol treatment. Expiratory wheezes still present in both lower fields.  CXR shows no pulmonary edema or consolidation.  Shortness of breath and chest discomfort resolved after second albuterol treatment.  Will recommend patient goes to see his PCP within a week following discharge to  assure he has albuterol to put in his home nebulizer.  BMP without significant changes from previous. BNP normal.    Lorayne Bender, PA-C 06/02/15 1517  Noemi Chapel, MD 06/02/15 [phone]

## 2015-06-02 NOTE — ED Provider Notes (Signed)
MSE was initiated and I personally evaluated the patient and placed orders (if any) at  12:18 PM on June 02, 2015.  The patient appears stable so that the remainder of the MSE may be completed by another provider.  69 y.o. M here with sinus drainage, chest tightness/congestion, SOB for the past week.  Son sick at home with similar symptoms.  States no longer able to use his CPAP due to sinus congestion.  Has long standing hx of sinus issues, followed by ENT.  States he is here to have "chest checked out".    Will move to acute side for further evaluation.  Larene Pickett, PA-C 06/02/15 [id number]  Gareth Morgan, MD 06/05/15 [phone]

## 2015-06-02 NOTE — ED Provider Notes (Signed)
69 year old male, distant history of congestive heart failure according to the patient, states he has had ongoing difficulty with breathing through his sinuses, his nose, has seen specialists and told he needed surgery to debulk mass or light more of a channel to breathe through his nose. He has had drainage, difficulty breathing, feels he cannot lay down at night as he cannot breathe well. He feels that a majority of this is because of his nose. He denies peripheral edema, on exam has minimal scant symmetrical bilateral lower extremity edema, he does have mild JVD. His abdomen is soft and nontender, he has occasional ectopy on auscultation of his heart but no murmurs rubs or gallops, no rales wheezing or rhonchi, no respiratory distress at all. Labs and x-ray ordered, EKG reviewed and interpreted as well. He does not have any pulmonary edema, EKG shows chronic findings, no acute ischemia, labs pending, anticipate discharge on antibiotics and follow-up in the outpatient setting. Doubt that his shortness of breath is related to cardiac source.  Medical screening examination/treatment/procedure(s) were conducted as a shared visit with non-physician practitioner(s) and myself.  I personally evaluated the patient during the encounter.  Clinical Impression:   Final diagnoses:  Shortness of breath  Other acute recurrent sinusitis        EKG Interpretation  Date/Time:  Friday June 02 2015 12:19:26 EDT Ventricular Rate:  54 PR Interval:  173 QRS Duration: 115 QT Interval:  449 QTC Calculation: 425 R Axis:   -42 Text Interpretation:  Sinus rhythm Atrial premature complex Nonspecific IVCD with LAD Abnormal T, consider ischemia, diffuse leads since last tracing no significant change Confirmed by Sabra Heck  MD, BRIAN ([id number]) on 06/02/2015 12:46:45 PM        Noemi Chapel, MD 06/02/15 [phone]

## 2015-06-02 NOTE — ED Notes (Signed)
PA at bedside.

## 2015-06-02 NOTE — ED Notes (Signed)
Pt c/o trouble breathing due to having sinus drainage and cant breathe with his c-pap machine bc his nose is stopped up for about a week.. Pt states that he is supposed to have an MRI on sinuses and possible surgery on his sinuses but doesn't know when.  Pt states that he has an ENT.

## 2015-06-02 NOTE — Discharge Instructions (Signed)
You have been prescribed an antibiotic (Doxycycline) for a suspected sinus infection. You have also been prescribed albuterol solution to be used with your home nebulizer. Followup with PCP as needed.  Return to ED should symptoms worsen.

## 2015-06-02 NOTE — ED Notes (Signed)
Respiratory called for breathing treatment.

## 2015-06-02 NOTE — ED Notes (Signed)
Patient is poor historian - states he is here for "a scan of my sinuses but I need to make sure my chest is okay before I have surgery".  Pt has no complaints of chest pain at this time.

## 2015-06-08 ENCOUNTER — Ambulatory Visit (INDEPENDENT_AMBULATORY_CARE_PROVIDER_SITE_OTHER): Payer: Medicare Other | Admitting: Cardiology

## 2015-06-08 ENCOUNTER — Telehealth: Payer: Self-pay | Admitting: Cardiology

## 2015-06-08 ENCOUNTER — Encounter: Payer: Self-pay | Admitting: Cardiology

## 2015-06-08 VITALS — BP 132/70 | HR 53 | Ht 69.0 in | Wt 251.0 lb

## 2015-06-08 DIAGNOSIS — I351 Nonrheumatic aortic (valve) insufficiency: Secondary | ICD-10-CM | POA: Diagnosis not present

## 2015-06-08 DIAGNOSIS — Z79899 Other long term (current) drug therapy: Secondary | ICD-10-CM

## 2015-06-08 NOTE — Patient Instructions (Addendum)
Your physician wants you to follow-up in: 1 Year. You will receive a reminder letter in the mail two months in advance. If you don't receive a letter, please call our office to schedule the follow-up appointment.  Your physician wants you to get an MRI  Your physician recommends that you return for lab work in: [facility]

## 2015-06-08 NOTE — Telephone Encounter (Signed)
Received records from [facility] for appointment on 06/08/15 with Dr Percival Spanish.  Records given to Dr Percival Spanish for his schedule on 06/08/15. lp

## 2015-06-08 NOTE — Progress Notes (Signed)
HPI The patient presents for evaluation of aortic root dilatation and aortic insufficiency..  He had a negative stress perfusion study in 2012 a mildly reduced ejection fraction. However, followup echo demonstrated normal left ventricular function with mild AI.  He was in the emergency room recently. I reviewed these records. He had some shortness of breath with his BNP was normal. The ER doctor reports that he did not think it was cardiac in origin. He complains of lots of upper respiratory issues with sinus problems. He has difficulty sleeping at night because of his breathing but it doesn't sound like classic PND or orthopnea. He's not describing chest discomfort, neck or arm discomfort. He's had no palpitations, presyncope or syncope. He's had no new edema though he has some mild chronic lower extremity edema.  Allergies  Allergen Reactions  . Amoxicillin Other (See Comments)    Can't move Has patient had a PCN reaction causing immediate rash, facial/tongue/throat swelling, SOB or lightheadedness with hypotension: No Has patient had a PCN reaction causing severe rash involving mucus membranes or skin necrosis: No Has patient had a PCN reaction that required hospitalization No Has patient had a PCN reaction occurring within the last 10 years: No If all of the above answers are "NO", then may proceed with Cephalosporin use.   [name] Penicillins Other (See Comments)    Can't move/dizziness Has patient had a PCN reaction causing immediate rash, facial/tongue/throat swelling, SOB or lightheadedness with hypotension: No Has patient had a PCN reaction causing severe rash involving mucus membranes or skin necrosis: No Has patient had a PCN reaction that required hospitalization No Has patient had a PCN reaction occurring within the last 10 years: No If all of the above answers are "NO", then may proceed with Cephalosporin use.     Current Outpatient Prescriptions  Medication Sig Dispense Refill    . albuterol (PROVENTIL HFA;VENTOLIN HFA) 108 (90 BASE) MCG/ACT inhaler Inhale 1-2 puffs into the lungs every 6 (six) hours as needed for wheezing or shortness of breath. 1 Inhaler 0  . albuterol (PROVENTIL) (5 MG/ML) 0.5% nebulizer solution Take 0.5 mLs (2.5 mg total) by nebulization every 6 (six) hours as needed for wheezing or shortness of breath. 20 mL 12  . amLODipine (NORVASC) 10 MG tablet Take 10 mg by mouth 2 (two) times daily.     [name] doxycycline (VIBRAMYCIN) 100 MG capsule Take 1 capsule (100 mg total) by mouth 2 (two) times daily. 20 capsule 0  . ipratropium (ATROVENT) 0.06 % nasal spray Place 2 sprays into both nostrils 2 (two) times daily.    [name] KLOR-CON M20 20 MEQ tablet Take 20 mEq by mouth daily.   5  . montelukast (SINGULAIR) 10 MG tablet Take 10 mg by mouth daily.  5  . tamsulosin (FLOMAX) 0.4 MG CAPS capsule Take 0.8 mg by mouth daily.    . [DISCONTINUED] Cetirizine HCl (ZYRTEC ALLERGY) 10 MG CAPS Take 1 capsule (10 mg total) by mouth at bedtime. (Patient not taking: Reported on 03/11/2015) 30 capsule 1   No current facility-administered medications for this visit.    Past Medical History  Diagnosis Date  . Syncope   . Hypertension   . COPD (chronic obstructive pulmonary disease) ([place])   . Hyperlipidemia   . Renal cell carcinoma ([place])     I do not see confirmation of this diagnosis.  He sees a urologist currently.    . Diabetes mellitus type II     diet controlled  Past Surgical History  Procedure Laterality Date  . Spinal fixation surgery  1974     ROS:  Urinary incontinence.  As stated in the HPI and negative for all other systems.  PHYSICAL EXAM BP 132/70 mmHg  Pulse 53  Ht 5\' 9"  (1.753 m)  Wt 251 lb (113.853 kg)  BMI 37.05 kg/m2  GENERAL:  Well appearing HEENT:  Pupils equal round and reactive, fundi not visualized, oral mucosa unremarkable, proptosis, poor dentition NECK:  No jugular venous distention, waveform within normal limits, carotid upstroke  brisk and symmetric, no bruits, no thyromegaly LUNGS:  Expiratory wheezing thoughouth CHEST:  Unremarkable HEART:  PMI not displaced or sustained,S1 and S2 within normal limits, no S3, no S4, no clicks, no rubs, diastolic murmur at the left 3rd intercostal space, short systolic murmur ABD:  Flat, positive bowel sounds normal in frequency in pitch, no bruits, no rebound, no guarding, no midline pulsatile mass, no hepatomegaly, no splenomegaly EXT:  2 plus pulses throughout, trace leg edema, no cyanosis no clubbing  EKG:  Sinus rhythm, rate 53, left axis deviation, poor anterior R wave progression, nonspecific inferior T-wave changes unchanged from previous.  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  However, because of this and the aortic root dilatation I will follow-up with an MRI.  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  CARDIOMYOPATHY:  His EF was improved on the echo last year.  His recent BNP in the ER was normal.. No further evaluation is warranted. He had a negative stress perfusion study in 2012 and has no ongoing anginal symptoms.    No further workup of this is planned.  HTN:  His blood pressure is well controlled. He will continue the meds as listed.

## 2015-06-15 ENCOUNTER — Encounter: Payer: Self-pay | Admitting: Cardiology

## 2015-06-29 ENCOUNTER — Ambulatory Visit (HOSPITAL_COMMUNITY): Payer: Medicare Other

## 2015-07-03 ENCOUNTER — Ambulatory Visit (HOSPITAL_COMMUNITY): Admission: RE | Admit: 2015-07-03 | Payer: Medicare Other | Source: Ambulatory Visit

## 2015-07-04 ENCOUNTER — Other Ambulatory Visit: Payer: Self-pay | Admitting: Cardiology

## 2015-07-04 DIAGNOSIS — I351 Nonrheumatic aortic (valve) insufficiency: Secondary | ICD-10-CM

## 2015-07-12 ENCOUNTER — Telehealth: Payer: Self-pay | Admitting: *Deleted

## 2015-07-12 NOTE — Telephone Encounter (Signed)
-----   Message from Minus Breeding, MD sent at 07/06/2015  9:17 PM EDT ----- Please call and reschedule study.  Let Dr. Meda Coffee know what the date of the reschedule is.  Thanks.   ----- Message -----    From: Dorothy Spark, MD    Sent: 07/05/2015   2:18 PM      To: Minus Breeding, MD  Maylon Cos, This patient didn't show up for his MRI appointment on Monday. [place] Siren   ----- Message -----    From: Minus Breeding, MD    Sent: 06/09/2015  10:19 AM      To: Dorothy Spark, MD  [place] Siren,  Can you look out for this.  Aorta 4.8 on echo with moderate AI.  Thanks.  Maylon Cos

## 2015-07-12 NOTE — Telephone Encounter (Signed)
Spoke with some one MRI to call [name] [name] to reschedule MRI

## 2015-07-17 ENCOUNTER — Encounter: Payer: Self-pay | Admitting: Cardiology

## 2015-07-20 ENCOUNTER — Other Ambulatory Visit: Payer: Self-pay | Admitting: Cardiology

## 2015-07-20 DIAGNOSIS — I351 Nonrheumatic aortic (valve) insufficiency: Secondary | ICD-10-CM

## 2015-07-26 ENCOUNTER — Ambulatory Visit (HOSPITAL_COMMUNITY): Admission: RE | Admit: 2015-07-26 | Payer: Medicare Other | Source: Ambulatory Visit

## 2015-07-26 ENCOUNTER — Other Ambulatory Visit: Payer: Self-pay | Admitting: Cardiology

## 2015-07-26 DIAGNOSIS — I351 Nonrheumatic aortic (valve) insufficiency: Secondary | ICD-10-CM

## 2015-07-26 DIAGNOSIS — Q251 Coarctation of aorta: Secondary | ICD-10-CM

## 2015-09-22 ENCOUNTER — Emergency Department (HOSPITAL_COMMUNITY)
Admission: EM | Admit: 2015-09-22 | Discharge: 2015-09-22 | Disposition: A | Discharge status: Home / Self Care | Payer: Medicare Other | Attending: Emergency Medicine | Admitting: Emergency Medicine

## 2015-09-22 ENCOUNTER — Encounter (HOSPITAL_COMMUNITY): Payer: Self-pay | Admitting: *Deleted

## 2015-09-22 ENCOUNTER — Emergency Department (HOSPITAL_COMMUNITY): Payer: Medicare Other

## 2015-09-22 DIAGNOSIS — Z79899 Other long term (current) drug therapy: Secondary | ICD-10-CM | POA: Insufficient documentation

## 2015-09-22 DIAGNOSIS — E119 Type 2 diabetes mellitus without complications: Secondary | ICD-10-CM | POA: Diagnosis not present

## 2015-09-22 DIAGNOSIS — R0602 Shortness of breath: Secondary | ICD-10-CM | POA: Diagnosis present

## 2015-09-22 DIAGNOSIS — I10 Essential (primary) hypertension: Secondary | ICD-10-CM | POA: Insufficient documentation

## 2015-09-22 DIAGNOSIS — R0600 Dyspnea, unspecified: Secondary | ICD-10-CM | POA: Insufficient documentation

## 2015-09-22 DIAGNOSIS — J441 Chronic obstructive pulmonary disease with (acute) exacerbation: Secondary | ICD-10-CM | POA: Diagnosis not present

## 2015-09-22 DIAGNOSIS — Z85528 Personal history of other malignant neoplasm of kidney: Secondary | ICD-10-CM | POA: Diagnosis not present

## 2015-09-22 DIAGNOSIS — Z880 Allergy status to penicillin: Secondary | ICD-10-CM | POA: Diagnosis not present

## 2015-09-22 LAB — BASIC METABOLIC PANEL
ANION GAP: 7 (ref 5–15)
BUN: 20 mg/dL (ref 6–20)
CALCIUM: 8.7 mg/dL — AB (ref 8.9–10.3)
CO2: 29 mmol/L (ref 22–32)
Chloride: 99 mmol/L — ABNORMAL LOW (ref 101–111)
Creatinine, Ser: 1.12 mg/dL (ref 0.61–1.24)
GFR calc Af Amer: >60 mL/min (ref >60)
GLUCOSE: 149 mg/dL — AB (ref 65–99)
Potassium: 3.3 mmol/L — ABNORMAL LOW (ref 3.5–5.1)
Sodium: 135 mmol/L (ref 135–145)

## 2015-09-22 LAB — CBC WITH DIFFERENTIAL/PLATELET
Basophils Absolute: 0 10*3/uL (ref 0.0–0.1)
Basophils Relative: 1 %
EOS ABS: 0.2 10*3/uL (ref 0.0–0.7)
EOS PCT: 2 %
HCT: 38.7 % — ABNORMAL LOW (ref 39.0–52.0)
Hemoglobin: 12.7 g/dL — ABNORMAL LOW (ref 13.0–17.0)
LYMPHS ABS: 1.8 10*3/uL (ref 0.7–4.0)
Lymphocytes Relative: 20 %
MCH: 30.6 pg (ref 26.0–34.0)
MCHC: 32.8 g/dL (ref 30.0–36.0)
MCV: 93.3 fL (ref 78.0–100.0)
MONOS PCT: 5 %
Monocytes Absolute: 0.5 10*3/uL (ref 0.1–1.0)
Neutro Abs: 6.4 10*3/uL (ref 1.7–7.7)
Neutrophils Relative %: 72 %
PLATELETS: 295 10*3/uL (ref 150–400)
RBC: 4.15 MIL/uL — ABNORMAL LOW (ref 4.22–5.81)
RDW: 13.6 % (ref 11.5–15.5)
WBC: 8.9 10*3/uL (ref 4.0–10.5)

## 2015-09-22 LAB — I-STAT TROPONIN, ED: Troponin i, poc: 0.02 ng/mL (ref 0.00–0.08)

## 2015-09-22 LAB — HEPATIC FUNCTION PANEL
ALT: 41 U/L (ref 17–63)
AST: 41 U/L (ref 15–41)
Albumin: 3.5 g/dL (ref 3.5–5.0)
Alkaline Phosphatase: 91 U/L (ref 38–126)
BILIRUBIN INDIRECT: 0.8 mg/dL (ref 0.3–0.9)
Bilirubin, Direct: 0.1 mg/dL (ref 0.1–0.5)
TOTAL PROTEIN: 7.6 g/dL (ref 6.5–8.1)
Total Bilirubin: 0.9 mg/dL (ref 0.3–1.2)

## 2015-09-22 LAB — D-DIMER, QUANTITATIVE: D-Dimer, Quant: 0.37 ug/mL-FEU (ref 0.00–0.50)

## 2015-09-22 LAB — BRAIN NATRIURETIC PEPTIDE: B NATRIURETIC PEPTIDE 5: 38.3 pg/mL (ref 0.0–100.0)

## 2015-09-22 MED ORDER — LORAZEPAM 0.5 MG PO TABS: 1.0000 mg | ORAL_TABLET | Freq: Three times a day (TID) | ORAL | Status: DC | PRN

## 2015-09-22 NOTE — ED Notes (Addendum)
Pt reports funny sensation in L chest today with SOB.  Pt reports "fluttering" in his chest and SOB.  Pt states that this has happened in the past.  States "my heart is out of tune."  Pt also reports feeling weak at present.

## 2015-09-22 NOTE — ED Provider Notes (Signed)
CSN: [id number]     Arrival date & time 09/22/15  1447 History   First MD Initiated Contact with Patient 09/22/15 1500     Chief Complaint  Patient presents with  . Chest Pain  . Shortness of Breath     (Consider location/radiation/quality/duration/timing/severity/associated sxs/prior Treatment) Patient is a 70 y.o. male presenting with shortness of breath. The history is provided by the patient (The patient states that he's had some shortness of breath today but it has improved now patient did not complain of any pain  or sweating).  Shortness of Breath Severity:  Moderate Onset quality:  Sudden Timing:  Intermittent Progression:  Waxing and waning Chronicity:  New Context: not activity   Relieved by:  Nothing Worsened by:  Nothing tried Associated symptoms: no abdominal pain, no chest pain, no cough, no headaches and no rash     Past Medical History  Diagnosis Date  . Syncope   . Hypertension   . COPD (chronic obstructive pulmonary disease) ([place])   . Hyperlipidemia   . Renal cell carcinoma ([place])     I do not see confirmation of this diagnosis.  He sees a urologist currently.    . Diabetes mellitus type II     diet controlled   Past Surgical History  Procedure Laterality Date  . Spinal fixation surgery  1974   Family History  Problem Relation Age of Onset  . Heart disease Mother     No details.  Still alive age 29  . Emphysema Father   . Asthma Father    Social History  Substance Use Topics  . Smoking status: Never Smoker   . Smokeless tobacco: None  . Alcohol Use: No    Review of Systems  Constitutional: Negative for appetite change and fatigue.  HENT: Negative for congestion, ear discharge and sinus pressure.   Eyes: Negative for discharge.  Respiratory: Positive for shortness of breath. Negative for cough.   Cardiovascular: Negative for chest pain.  Gastrointestinal: Negative for abdominal pain and diarrhea.  Genitourinary: Negative for frequency and  hematuria.  Musculoskeletal: Negative for back pain.  Skin: Negative for rash.  Neurological: Negative for seizures and headaches.  Psychiatric/Behavioral: Negative for hallucinations.      Allergies  Amoxicillin and Penicillins  Home Medications   Prior to Admission medications   Medication Sig Start Date End Date Taking? Authorizing Provider  amLODipine (NORVASC) 10 MG tablet Take 10 mg by mouth 2 (two) times daily.  12/25/11  Yes Historical Provider, MD  doxycycline (VIBRAMYCIN) 100 MG capsule Take 100 mg by mouth 2 (two) times daily. ABT Start Date 09/16/15 & End Date 09/25/15. 09/13/15  Yes Historical Provider, MD  montelukast (SINGULAIR) 10 MG tablet Take 10 mg by mouth daily. 01/15/15  Yes Historical Provider, MD  potassium chloride (K-DUR) 10 MEQ tablet Take 10 mEq by mouth daily.   Yes Historical Provider, MD  tamsulosin (FLOMAX) 0.4 MG CAPS capsule Take 0.8 mg by mouth daily.   Yes Historical Provider, MD  albuterol (PROVENTIL HFA;VENTOLIN HFA) 108 (90 BASE) MCG/ACT inhaler Inhale 1-2 puffs into the lungs every 6 (six) hours as needed for wheezing or shortness of breath. 03/11/15   Clayton Bibles, PA-C  albuterol (PROVENTIL) (5 MG/ML) 0.5% nebulizer solution Take 0.5 mLs (2.5 mg total) by nebulization every 6 (six) hours as needed for wheezing or shortness of breath. 06/02/15   Shawn C Joy, PA-C  cetirizine (ZYRTEC) 10 MG tablet TAKE 1 TABLET EVERY DAY AS NEEDED 09/13/15  Historical Provider, MD  doxycycline (VIBRAMYCIN) 100 MG capsule Take 1 capsule (100 mg total) by mouth 2 (two) times daily. Patient not taking: Reported on 09/22/2015 06/02/15   Shawn C Joy, PA-C  LORazepam (ATIVAN) 0.5 MG tablet Take 2 tablets (1 mg total) by mouth every 8 (eight) hours as needed for anxiety. 09/22/15   Milton Ferguson, MD   BP 126/80 mmHg  Pulse 56  Temp(Src) 97.8 F (36.6 C) (Oral)  Resp 18  SpO2 98% Physical Exam  Constitutional: He is oriented to person, place, and time. He appears well-developed.   HENT:  Head: Normocephalic.  Eyes: Conjunctivae and EOM are normal. No scleral icterus.  Neck: Neck supple. No thyromegaly present.  Cardiovascular: Normal rate and regular rhythm.  Exam reveals no gallop and no friction rub.   No murmur heard. Pulmonary/Chest: No stridor. He has no wheezes. He has no rales. He exhibits no tenderness.  Abdominal: He exhibits no distension. There is no tenderness. There is no rebound.  Musculoskeletal: Normal range of motion. He exhibits no edema.  Lymphadenopathy:    He has no cervical adenopathy.  Neurological: He is oriented to person, place, and time. He exhibits normal muscle tone. Coordination normal.  Skin: No rash noted. No erythema.  Psychiatric: He has a normal mood and affect. His behavior is normal.    ED Course  Procedures (including critical care time) Labs Review Labs Reviewed  BASIC METABOLIC PANEL - Abnormal; Notable for the following:    Potassium 3.3 (*)    Chloride 99 (*)    Glucose, Bld 149 (*)    Calcium 8.7 (*)    All other components within normal limits  CBC WITH DIFFERENTIAL/PLATELET - Abnormal; Notable for the following:    RBC 4.15 (*)    Hemoglobin 12.7 (*)    HCT 38.7 (*)    All other components within normal limits  HEPATIC FUNCTION PANEL  BRAIN NATRIURETIC PEPTIDE  D-DIMER, QUANTITATIVE (NOT AT [facility])  Randolm Idol, ED    Imaging Review Dg Chest 2 View  09/22/2015  CLINICAL DATA:  Chest fluttering and shortness of breath. EXAM: CHEST - 2 VIEW COMPARISON:  Two-view chest x-ray 06/02/2015 FINDINGS: The heart is enlarged. There is no edema or effusion to suggest failure. Degenerative changes are present in the thoracic spine. The visualized soft tissues and bony thorax are otherwise unremarkable. IMPRESSION: No acute cardiopulmonary disease. Electronically Signed   By: San Morelle M.D.   On: 09/22/2015 15:17   I have personally reviewed and evaluated these images and lab results as part of my medical  decision-making.   EKG Interpretation   Date/Time:  Friday September 22 2015 14:55:55 EST Ventricular Rate:  62 PR Interval:  151 QRS Duration: 113 QT Interval:  421 QTC Calculation: 427 R Axis:   -36 Text Interpretation:  Sinus rhythm Atrial premature complex Borderline  IVCD with LAD Probable anteroseptal infarct, old Nonspecific T  abnormalities, inferior leads Baseline wander in lead(s) V3 V5 Confirmed  by Aleisa Howk  MD, Imani Sherrin [phone]) on 09/22/2015 3:08:16 PM      MDM   Final diagnoses:  Dyspnea    Labs unremarkable including troponin and d-dimer chest x-ray unremarkable. Patient no longer has shortness of breath.  Possible anxiety causing problems. Patient will be given prescription for Ativan will follow-up with his PCP next week    Milton Ferguson, MD 09/22/15 [phone]

## 2015-09-22 NOTE — Discharge Instructions (Signed)
Follow-up with her family next week

## 2015-10-17 ENCOUNTER — Observation Stay (HOSPITAL_COMMUNITY)
Admission: EM | Admit: 2015-10-17 | Discharge: 2015-10-18 | Disposition: A | Discharge status: Home / Self Care | Payer: Medicare Other | Attending: Internal Medicine | Admitting: Internal Medicine

## 2015-10-17 ENCOUNTER — Emergency Department (HOSPITAL_COMMUNITY): Payer: Medicare Other

## 2015-10-17 ENCOUNTER — Encounter (HOSPITAL_COMMUNITY): Payer: Self-pay | Admitting: Emergency Medicine

## 2015-10-17 DIAGNOSIS — E876 Hypokalemia: Secondary | ICD-10-CM

## 2015-10-17 DIAGNOSIS — G4733 Obstructive sleep apnea (adult) (pediatric): Secondary | ICD-10-CM | POA: Diagnosis not present

## 2015-10-17 DIAGNOSIS — J4521 Mild intermittent asthma with (acute) exacerbation: Secondary | ICD-10-CM | POA: Diagnosis not present

## 2015-10-17 DIAGNOSIS — Z880 Allergy status to penicillin: Secondary | ICD-10-CM | POA: Insufficient documentation

## 2015-10-17 DIAGNOSIS — R05 Cough: Secondary | ICD-10-CM

## 2015-10-17 DIAGNOSIS — Z23 Encounter for immunization: Secondary | ICD-10-CM | POA: Insufficient documentation

## 2015-10-17 DIAGNOSIS — I459 Conduction disorder, unspecified: Secondary | ICD-10-CM | POA: Insufficient documentation

## 2015-10-17 DIAGNOSIS — E119 Type 2 diabetes mellitus without complications: Secondary | ICD-10-CM | POA: Diagnosis not present

## 2015-10-17 DIAGNOSIS — N400 Enlarged prostate without lower urinary tract symptoms: Secondary | ICD-10-CM | POA: Diagnosis not present

## 2015-10-17 DIAGNOSIS — R0602 Shortness of breath: Secondary | ICD-10-CM

## 2015-10-17 DIAGNOSIS — I77810 Thoracic aortic ectasia: Secondary | ICD-10-CM | POA: Diagnosis not present

## 2015-10-17 DIAGNOSIS — I10 Essential (primary) hypertension: Secondary | ICD-10-CM | POA: Insufficient documentation

## 2015-10-17 DIAGNOSIS — E785 Hyperlipidemia, unspecified: Secondary | ICD-10-CM | POA: Insufficient documentation

## 2015-10-17 DIAGNOSIS — R0600 Dyspnea, unspecified: Secondary | ICD-10-CM | POA: Diagnosis present

## 2015-10-17 DIAGNOSIS — R059 Cough, unspecified: Secondary | ICD-10-CM

## 2015-10-17 DIAGNOSIS — J45901 Unspecified asthma with (acute) exacerbation: Secondary | ICD-10-CM | POA: Diagnosis present

## 2015-10-17 DIAGNOSIS — J209 Acute bronchitis, unspecified: Secondary | ICD-10-CM | POA: Diagnosis present

## 2015-10-17 DIAGNOSIS — R0902 Hypoxemia: Secondary | ICD-10-CM

## 2015-10-17 DIAGNOSIS — J4520 Mild intermittent asthma, uncomplicated: Secondary | ICD-10-CM

## 2015-10-17 HISTORY — DX: Thoracic aortic ectasia: I77.810

## 2015-10-17 HISTORY — DX: Unspecified asthma, uncomplicated: J45.909

## 2015-10-17 HISTORY — DX: Pneumonia, unspecified organism: J18.9

## 2015-10-17 HISTORY — DX: Malignant neoplasm of prostate: C61

## 2015-10-17 HISTORY — DX: Anxiety disorder, unspecified: F41.9

## 2015-10-17 HISTORY — DX: Sleep apnea, unspecified: G47.30

## 2015-10-17 LAB — COMPREHENSIVE METABOLIC PANEL
ALT: 53 U/L (ref 17–63)
ANION GAP: 13 (ref 5–15)
AST: 61 U/L — ABNORMAL HIGH (ref 15–41)
Albumin: 3.1 g/dL — ABNORMAL LOW (ref 3.5–5.0)
Alkaline Phosphatase: 90 U/L (ref 38–126)
BILIRUBIN TOTAL: 1 mg/dL (ref 0.3–1.2)
BUN: 12 mg/dL (ref 6–20)
CALCIUM: 8.9 mg/dL (ref 8.9–10.3)
CO2: 28 mmol/L (ref 22–32)
Chloride: 96 mmol/L — ABNORMAL LOW (ref 101–111)
Creatinine, Ser: 0.84 mg/dL (ref 0.61–1.24)
Glucose, Bld: 113 mg/dL — ABNORMAL HIGH (ref 65–99)
POTASSIUM: 3.1 mmol/L — AB (ref 3.5–5.1)
Sodium: 137 mmol/L (ref 135–145)
TOTAL PROTEIN: 7.6 g/dL (ref 6.5–8.1)

## 2015-10-17 LAB — CBC WITH DIFFERENTIAL/PLATELET
BASOS PCT: 0 %
Basophils Absolute: 0 10*3/uL (ref 0.0–0.1)
Eosinophils Absolute: 0.2 10*3/uL (ref 0.0–0.7)
Eosinophils Relative: 4 %
HEMATOCRIT: 39 % (ref 39.0–52.0)
Hemoglobin: 13.1 g/dL (ref 13.0–17.0)
LYMPHS ABS: 1.4 10*3/uL (ref 0.7–4.0)
LYMPHS PCT: 27 %
MCH: 30.5 pg (ref 26.0–34.0)
MCHC: 33.6 g/dL (ref 30.0–36.0)
MCV: 90.7 fL (ref 78.0–100.0)
MONO ABS: 0.3 10*3/uL (ref 0.1–1.0)
MONOS PCT: 6 %
NEUTROS ABS: 3.2 10*3/uL (ref 1.7–7.7)
Neutrophils Relative %: 63 %
Platelets: 207 10*3/uL (ref 150–400)
RBC: 4.3 MIL/uL (ref 4.22–5.81)
RDW: 13.2 % (ref 11.5–15.5)
WBC: 5.2 10*3/uL (ref 4.0–10.5)

## 2015-10-17 LAB — TSH: TSH: 0.467 u[IU]/mL (ref 0.350–4.500)

## 2015-10-17 LAB — TROPONIN I: Troponin I: <0.03 ng/mL (ref <0.031)

## 2015-10-17 LAB — I-STAT ARTERIAL BLOOD GAS, ED
BICARBONATE: 25.2 meq/L — AB (ref 20.0–24.0)
O2 Saturation: 92 %
PH ART: 7.389 (ref 7.350–7.450)
PO2 ART: 64 mmHg — AB (ref 80.0–100.0)
Patient temperature: 98.6
TCO2: 26 mmol/L (ref 0–100)
pCO2 arterial: 41.7 mmHg (ref 35.0–45.0)

## 2015-10-17 LAB — BRAIN NATRIURETIC PEPTIDE: B NATRIURETIC PEPTIDE 5: 22.3 pg/mL (ref 0.0–100.0)

## 2015-10-17 LAB — GLUCOSE, CAPILLARY: GLUCOSE-CAPILLARY: 242 mg/dL — AB (ref 65–99)

## 2015-10-17 MED ORDER — AMLODIPINE BESYLATE 10 MG PO TABS
10.0000 mg | ORAL_TABLET | Freq: Every day | ORAL | Status: DC
  Administered 2015-10-18: 10 mg via ORAL
  Filled 2015-10-17: qty 1

## 2015-10-17 MED ORDER — PNEUMOCOCCAL VAC POLYVALENT 25 MCG/0.5ML IJ INJ
0.5000 mL | INJECTION | INTRAMUSCULAR | Status: AC
  Administered 2015-10-18: 0.5 mL via INTRAMUSCULAR
  Filled 2015-10-17: qty 0.5

## 2015-10-17 MED ORDER — IPRATROPIUM BROMIDE 0.02 % IN SOLN
1.0000 mg | Freq: Once | RESPIRATORY_TRACT | Status: AC
  Administered 2015-10-17: 1 mg via RESPIRATORY_TRACT
  Filled 2015-10-17: qty 5

## 2015-10-17 MED ORDER — LEVOFLOXACIN IN D5W 750 MG/150ML IV SOLN: 750.0000 mg | INTRAVENOUS | Status: DC

## 2015-10-17 MED ORDER — METHYLPREDNISOLONE SODIUM SUCC 125 MG IJ SOLR
125.0000 mg | Freq: Once | INTRAMUSCULAR | Status: AC
  Administered 2015-10-17: 125 mg via INTRAVENOUS
  Filled 2015-10-17: qty 2

## 2015-10-17 MED ORDER — ALBUTEROL SULFATE (2.5 MG/3ML) 0.083% IN NEBU
5.0000 mg | INHALATION_SOLUTION | Freq: Once | RESPIRATORY_TRACT | Status: AC
  Administered 2015-10-17: 5 mg via RESPIRATORY_TRACT

## 2015-10-17 MED ORDER — IPRATROPIUM-ALBUTEROL 0.5-2.5 (3) MG/3ML IN SOLN
3.0000 mL | Freq: Four times a day (QID) | RESPIRATORY_TRACT | Status: DC
  Administered 2015-10-17 – 2015-10-18 (×2): 3 mL via RESPIRATORY_TRACT
  Filled 2015-10-17: qty 3

## 2015-10-17 MED ORDER — ALBUTEROL (5 MG/ML) CONTINUOUS INHALATION SOLN
10.0000 mg/h | INHALATION_SOLUTION | RESPIRATORY_TRACT | Status: DC
  Administered 2015-10-17: 10 mg/h via RESPIRATORY_TRACT
  Filled 2015-10-17: qty 20

## 2015-10-17 MED ORDER — TAMSULOSIN HCL 0.4 MG PO CAPS
0.8000 mg | ORAL_CAPSULE | Freq: Every day | ORAL | Status: DC
  Administered 2015-10-18: 0.8 mg via ORAL
  Filled 2015-10-17: qty 2

## 2015-10-17 MED ORDER — SODIUM CHLORIDE 0.9% FLUSH: 3.0000 mL | INTRAVENOUS | Status: DC | PRN

## 2015-10-17 MED ORDER — POTASSIUM CHLORIDE CRYS ER 20 MEQ PO TBCR
40.0000 meq | EXTENDED_RELEASE_TABLET | Freq: Once | ORAL | Status: AC
  Administered 2015-10-17: 40 meq via ORAL
  Filled 2015-10-17: qty 2

## 2015-10-17 MED ORDER — ALBUTEROL SULFATE (2.5 MG/3ML) 0.083% IN NEBU
INHALATION_SOLUTION | RESPIRATORY_TRACT | Status: AC
  Filled 2015-10-17: qty 6

## 2015-10-17 MED ORDER — SODIUM CHLORIDE 0.9% FLUSH
3.0000 mL | Freq: Two times a day (BID) | INTRAVENOUS | Status: DC
  Administered 2015-10-17 (×2): 3 mL via INTRAVENOUS

## 2015-10-17 MED ORDER — IPRATROPIUM-ALBUTEROL 0.5-2.5 (3) MG/3ML IN SOLN
3.0000 mL | Freq: Four times a day (QID) | RESPIRATORY_TRACT | Status: DC
  Administered 2015-10-17: 3 mL via RESPIRATORY_TRACT
  Filled 2015-10-17 (×2): qty 3

## 2015-10-17 MED ORDER — PREDNISONE 20 MG PO TABS: 40.0000 mg | ORAL_TABLET | Freq: Every day | ORAL | Status: DC

## 2015-10-17 MED ORDER — ENOXAPARIN SODIUM 40 MG/0.4ML ~~LOC~~ SOLN
40.0000 mg | SUBCUTANEOUS | Status: DC
  Administered 2015-10-17: 40 mg via SUBCUTANEOUS
  Filled 2015-10-17: qty 0.4

## 2015-10-17 MED ORDER — PREDNISONE 20 MG PO TABS
40.0000 mg | ORAL_TABLET | Freq: Every day | ORAL | Status: DC
  Administered 2015-10-18: 40 mg via ORAL
  Filled 2015-10-17: qty 2

## 2015-10-17 MED ORDER — ACETAMINOPHEN 325 MG PO TABS: 650.0000 mg | ORAL_TABLET | Freq: Four times a day (QID) | ORAL | Status: DC | PRN

## 2015-10-17 MED ORDER — ACETAMINOPHEN 650 MG RE SUPP: 650.0000 mg | Freq: Four times a day (QID) | RECTAL | Status: DC | PRN

## 2015-10-17 NOTE — ED Notes (Signed)
Pt SpO2 noted to be 79-80% RA.  Pt had removed the completed continuous nebulizer mask and was sleeping.  Spoke with pt and instructed deep breathing.  SpO2 response was only to mid 80s.  Placed pt on 4L nasal cannula with increase to 91-92%.  Notified [place], [place].

## 2015-10-17 NOTE — ED Notes (Signed)
Pt.  reports worsening productive cough with chest congestion / SOB with exertion  onset last week , denies fever or chills .

## 2015-10-17 NOTE — H&P (Signed)
Date: 10/17/2015               Patient Name:  [name] [name] MRN: [id number]  DOB: 1946/03/02 Age / Sex: 70 y.o., male   PCP: Vicenta Aly, FNP         Medical Service: Internal Medicine Teaching Service         Attending Physician: Dr. Oval Linsey, MD    First Contact: Lynda Rainwater, MS4 Pager: [phone]  Second Contact: Dr. Osa Craver Pager: [phone]       After Hours (After 5p/  First Contact Pager: [phone]  weekends / holidays): Second Contact Pager: [phone]   Chief Complaint: productive cough and dyspnea  History of Present Illness: [name] [name] is a 70 year old male with PMH of HTN, diet controlled DM, HLD, aortic root dilatation, aortic insufficiency, OSA and hx of RAD here with cough and dyspnea x 1 week.  He says he initially began to feel warm while driving back from his brother's funeral five days ago (last Thursday).  He thought it was due to warm weather outside.  Once he got home, he experienced chills and symptoms progressed to include chest congestion, cough productive of sputum (sometimes thick but non bloody) and dyspnea.  He normally sleeps with four pillows but has had to sleep sitting up the past few nights due to orthopnea.  He denies sore throat, rhinorrhea, sneezing, vision change, photophobia, neck pain/stiffness, myalgias, headache, chest pain, LE edema or sick contacts.  He drove approximately 3.5 hours each way to his brother's funeral in [place], [place] last week.  He denies hx of cancer, recent surgery or prolonged immobility.  He has been using prn albuterol inhaler twice per day in recent days but normally never needs to use it.  He went to his PCP yesterday and was prescribed azithromycin and prednisone.    He feels this symptoms are due to "bronchitis" but he says he has not had PFTs in a long time and he is not sure if he carries the dx of COPD.  He reports childhood asthma that he grew out of.   In the ED:  T 97.1F, RR 12-22, BP 130s-140s/70s-90s, HR  60-120s, he de-satted to low 80s on RA but improved to 94% on 4L.  He received albuterol and ipratropium nebs, 134mcg IV solumedrol and Kdur.  He tells me he already feels breathing is better after nebulizers and IV steroid.  Meds: No current facility-administered medications for this encounter.   Current Outpatient Prescriptions  Medication Sig Dispense Refill  . albuterol (PROVENTIL HFA;VENTOLIN HFA) 108 (90 BASE) MCG/ACT inhaler Inhale 1-2 puffs into the lungs every 6 (six) hours as needed for wheezing or shortness of breath. 1 Inhaler 0  . albuterol (PROVENTIL) (5 MG/ML) 0.5% nebulizer solution Take 0.5 mLs (2.5 mg total) by nebulization every 6 (six) hours as needed for wheezing or shortness of breath. 20 mL 12  . amLODipine (NORVASC) 10 MG tablet Take 10 mg by mouth 2 (two) times daily.     [name] azithromycin (ZITHROMAX) 250 MG tablet Take 250 mg by mouth.    . potassium chloride (K-DUR) 10 MEQ tablet Take 10 mEq by mouth daily.    . predniSONE (DELTASONE) 20 MG tablet Take 20 mg by mouth daily. Reported on 10/17/2015    . tamsulosin (FLOMAX) 0.4 MG CAPS capsule Take 0.8 mg by mouth daily.    [name] LORazepam (ATIVAN) 0.5 MG tablet Take 2 tablets (1 mg total) by mouth every 8 (  eight) hours as needed for anxiety. (Patient not taking: Reported on 10/17/2015) 20 tablet 0    Allergies: Allergies as of 10/17/2015 - Review Complete 10/17/2015  Allergen Reaction Noted  . Amoxicillin Other (See Comments) 11/15/2014  . Penicillins Other (See Comments) 11/15/2014   Past Medical History  Diagnosis Date  . Syncope   . Hypertension   . COPD (chronic obstructive pulmonary disease) ([place])   . Hyperlipidemia   . Renal cell carcinoma ([place])     I do not see confirmation of this diagnosis.  He sees a urologist currently.    . Diabetes mellitus type II     diet controlled   Past Surgical History  Procedure Laterality Date  . Spinal fixation surgery  1974   Family History  Problem Relation Age of Onset    . Heart disease Mother     No details.  Still alive age 51  . Emphysema Father   . Asthma Father    Social History   Social History  . Marital Status: Married    Spouse Name: N/A  . Number of Children: N/A  . Years of Education: N/A   Occupational History  . Not on file.   Social History Main Topics  . Smoking status: Never Smoker   . Smokeless tobacco: Not on file  . Alcohol Use: No  . Drug Use: Not on file  . Sexual Activity: Not on file   Other Topics Concern  . Not on file   Social History Narrative   Lives alone.  Wife died in 2023-04-03.  Lives in apartment.      Review of Systems: General:  He reports subjective fevers last week; he denies decreased appetite or unexplained weight loss. HEENT:  He denies sore throat, rhinorrhea, sneezing, change in vision, photophobia, neck pain or stiffness.   Heart:  He denies chest pain but says he sometimes experiences palpitations.  His BP monitor often indicates a fast HR. Resp:  Per HPI GI:  He reports three days of N/V and diarrhea two weeks ago after eating out but this quickly resolved and he is now having normal BMs (last BM was yesterday) and no N/V or abdominal pain. GU:  Unchanged difficulty urinating due to BPH.  He denies dysuria or hematuria. Neuro:  He denies focal weakness.  Physical Exam: Blood pressure 146/90, pulse 83, temperature 97.2 F (36.2 C), temperature source Oral, resp. rate 22, height 5\' 9"  (1.753 m), weight 241 lb (109.317 kg), SpO2 96 %.  General: resting in bed in NAD HEENT: PERRL, EOMI, no scleral icterus Cardiac: irregular, no rubs or gallops, + 2/6 diastolic murmur, no carotid bruits, no JVD, 2+ radials and DPs Pulm: no respiratory distress or accessory muscle use, diffuse wheezes, moving normal volumes of air Abd: soft, nontender, nondistended, BS present Ext: warm and well perfused, 1+ pedal edema B/L Neuro: alert and oriented X3, cranial nerves II-XII grossly intact, 5/5 muscle strength B/L  upper and lower extremities, sensation grossly intact B/L, responding appropriately, following commands  Lab results: Basic Metabolic Panel:  Recent Labs  10/17/15 0624  NA 137  K 3.1*  CL 96*  CO2 28  GLUCOSE 113*  BUN 12  CREATININE 0.84  CALCIUM 8.9   Liver Function Tests:  Recent Labs  10/17/15 0624  AST 61*  ALT 53  ALKPHOS 90  BILITOT 1.0  PROT 7.6  ALBUMIN 3.1*   CBC:  Recent Labs  10/17/15 0624  WBC 5.2  NEUTROABS 3.2  HGB  13.1  HCT 39.0  MCV 90.7  PLT 207   Cardiac Enzymes:  Recent Labs  10/17/15 0745  TROPONINI <0.03   BNP: 22.3  Imaging results:  Dg Chest 2 View  10/17/2015  CLINICAL DATA:  Bronchitis and shortness of breath since last Thursday. EXAM: CHEST  2 VIEW COMPARISON:  09/22/2015 FINDINGS: Cardiac enlargement without vascular congestion. Calcified and tortuous aorta. No focal airspace disease or consolidation in the lungs. No blunting of costophrenic angles. Mild emphysematous changes. No pneumothorax. Mild degenerative changes in the spine. IMPRESSION: Cardiac enlargement.  No evidence of active pulmonary disease. Electronically Signed   By: Lucienne Capers M.D.   On: 10/17/2015 06:06    Other results: EKG:  TWI inferior and lateral, similar to prior  Assessment & Plan by Problem:  Dyspnea and increased sputum production:  Dyspnea, cough and purulent sputum x 1 week without fever, leukocytosis or CXR findings to suggest CAP.  There is questionable hx of COPD, however, the patient has only remote smoking hx (smoked for 1 year 40 years ago) and I requested records from the patient's PCP and he does not carry the dx of COPD.  Furthermore, he denies hx of dyspnea or sputum which are required for COPD dx.  He does have reactive airway disease listed.  Per records that I received, he was seen yesterday and prescribed azithromycin and prednisone for acute bronchitis.   He has hx of diastolic dysfunction but no evidence of volume overload. -  oxygen supplementation prn - check flu panel, HIV, ABG - continue prednisone, duonebs, prn Tylenol - no antibiotic indicated as this is likely viral process - consider PFTs outpatient after resolution of acute illness  Irregular HR/aortic root dilatation/AI:  Intermittently going from HR 60s to 120s during exam.  Appears to be sinus tachycardia but feels irregular on exam. Initial ED EKG show TWI similar to prior and troponin is negative.  He denies CP but occasionally has palpitations. When he checks his BP at home he notes the HR is sometimes elevated.  His problem list from PCP lists "other unspecified cardiac dysrhythmias" but no specific dx.  He follows closely with cardiology for diastolic dysfunction, aortic insufficiency and aortic root dilatation.  Last measured 4.8cm by ECHO in 2015.  Wheezing and dyspnea are possible if aneurysm compressing of pulmonary structures.  However, this would be unlikely with no chest/back/abdominal pain.   Peripheral pulses are palpable.  BP normal.  His AI is mild and he has no HF signs or symptoms.  When he was seen by Dr. Percival Spanish in Oct 2016, MR MRA chest and cardiac MRI were ordered for further evaluation. - repeat EKG - telemetry monitoring - consider getting MR studies inpatient  HTN:  On amlodipine at home.  BP well controlled here. - continue amlodipine - vitals qshift  Type 2 diabetes mellitus, diet controlled:  Blood glucose ok at 113.   - check CBGs BID while on prednisone - no need for insulin with current blood glucose levels  Diet:  Carb mod VTE ppx:   Lovenox Code status:  Full code confirmed with patient.   Dispo: Disposition is deferred at this time, awaiting improvement of current medical problems. Anticipated discharge in approximately 2-3 day(s).   The patient does have a current PCP Vicenta Aly, [place]) and does not need an [facility] hospital follow-up appointment after discharge.  The patient does not know have transportation  limitations that hinder transportation to clinic appointments.  Signed: Francesca Oman, DO  10/17/2015, 10:08 AM

## 2015-10-17 NOTE — ED Provider Notes (Signed)
CSN: [id number]     Arrival date & time 10/17/15  0540 History   First MD Initiated Contact with Patient 10/17/15 [phone]     Chief Complaint  Patient presents with  . Cough     (Consider location/radiation/quality/duration/timing/severity/associated sxs/prior Treatment) The history is provided by the patient and medical records. No language interpreter was used.     [name] [name] is a 71 y.o. male  with a hx of kidney, hypertension, COPD, asthma, non-insulin-dependent type 2 diabetes presents to the Emergency Department complaining of gradual, persistent, progressively worsening shortness of breath and worsening dyspnea on exertion onset 1 week ago after contracting a viral URI. Patient reports he's had worsening symptoms. He saw his primary care physician yesterday and was given azithromycin and prednisone. He reports that this morning his nebulizer machine broke and he was unable to complete a treatment. He reports that he normally walks 30 minutes per day without chest pain or shortness of breath however in the last week he has been unable to walk even very short distances without dyspnea. He denies pain in his legs, previous DVT, unilateral leg swelling, peripheral edema, palpitations.  He does report card trip to see family approximately 2 weeks ago. He denies fever, chills, diaphoresis, syncope, nausea, vomiting.  Patient reports that his albuterol treatments helped only some in the last week.    Past Medical History  Diagnosis Date  . Syncope   . Hypertension   . COPD (chronic obstructive pulmonary disease) ([place])   . Hyperlipidemia   . Renal cell carcinoma ([place])     I do not see confirmation of this diagnosis.  He sees a urologist currently.    . Diabetes mellitus type II     diet controlled   Past Surgical History  Procedure Laterality Date  . Spinal fixation surgery  1974   Family History  Problem Relation Age of Onset  . Heart disease Mother     No details.  Still alive age 59   . Emphysema Father   . Asthma Father    Social History  Substance Use Topics  . Smoking status: Never Smoker   . Smokeless tobacco: None  . Alcohol Use: No    Review of Systems  Constitutional: Negative for fever, diaphoresis, appetite change, fatigue and unexpected weight change.  HENT: Negative for mouth sores.   Eyes: Negative for visual disturbance.  Respiratory: Positive for cough, chest tightness, shortness of breath and wheezing.   Cardiovascular: Negative for chest pain, palpitations and leg swelling.  Gastrointestinal: Negative for nausea, vomiting, abdominal pain, diarrhea and constipation.  Endocrine: Negative for polydipsia, polyphagia and polyuria.  Genitourinary: Negative for dysuria, urgency, frequency and hematuria.  Musculoskeletal: Negative for back pain and neck stiffness.  Skin: Negative for rash.  Allergic/Immunologic: Negative for immunocompromised state.  Neurological: Negative for syncope, light-headedness and headaches.  Hematological: Does not bruise/bleed easily.  Psychiatric/Behavioral: Negative for sleep disturbance. The patient is not nervous/anxious.       Allergies  Amoxicillin and Penicillins  Home Medications   Prior to Admission medications   Medication Sig Start Date End Date Taking? Authorizing Provider  albuterol (PROVENTIL HFA;VENTOLIN HFA) 108 (90 BASE) MCG/ACT inhaler Inhale 1-2 puffs into the lungs every 6 (six) hours as needed for wheezing or shortness of breath. 03/11/15  Yes Clayton Bibles, PA-C  albuterol (PROVENTIL) (5 MG/ML) 0.5% nebulizer solution Take 0.5 mLs (2.5 mg total) by nebulization every 6 (six) hours as needed for wheezing or shortness of breath. 06/02/15  Yes Shawn C Joy, PA-C  amLODipine (NORVASC) 10 MG tablet Take 10 mg by mouth 2 (two) times daily.  12/25/11  Yes Historical Provider, MD  azithromycin (ZITHROMAX) 250 MG tablet Take 250 mg by mouth. 10/16/15  Yes Historical Provider, MD  potassium chloride (K-DUR) 10 MEQ  tablet Take 10 mEq by mouth daily.   Yes Historical Provider, MD  predniSONE (DELTASONE) 20 MG tablet Take 20 mg by mouth daily. Reported on 10/17/2015 10/16/15  Yes Historical Provider, MD  tamsulosin (FLOMAX) 0.4 MG CAPS capsule Take 0.8 mg by mouth daily.   Yes Historical Provider, MD  LORazepam (ATIVAN) 0.5 MG tablet Take 2 tablets (1 mg total) by mouth every 8 (eight) hours as needed for anxiety. Patient not taking: Reported on 10/17/2015 09/22/15   Milton Ferguson, MD   BP 146/90 mmHg  Pulse 83  Temp(Src) 97.2 F (36.2 C) (Oral)  Resp 22  Ht 5\' 9"  (1.753 m)  Wt 109.317 kg  BMI 35.57 kg/m2  SpO2 96% Physical Exam  Constitutional: He appears well-developed and well-nourished. No distress.  Awake, alert, nontoxic appearance  HENT:  Head: Normocephalic and atraumatic.  Mouth/Throat: Oropharynx is clear and moist. No oropharyngeal exudate.  Eyes: Conjunctivae are normal. No scleral icterus.  Neck: Normal range of motion. Neck supple.  Cardiovascular: Normal rate, regular rhythm, normal heart sounds and intact distal pulses.   Pulmonary/Chest: Accessory muscle usage present. Tachypnea noted. He is in respiratory distress ( mild). He has decreased breath sounds. He has wheezes. He has rhonchi. He exhibits no tenderness.  Equal chest expansion  Abdominal: Soft. Bowel sounds are normal. He exhibits no mass. There is no tenderness. There is no rebound and no guarding.  Musculoskeletal: Normal range of motion. He exhibits no edema.  No calf  Neurological: He is alert.  Speech is clear and goal oriented Moves extremities without ataxia  Skin: Skin is warm and dry. He is not diaphoretic.  Psychiatric: He has a normal mood and affect.  Nursing note and vitals reviewed.   ED Course  Procedures (including critical care time) Labs Review Labs Reviewed  COMPREHENSIVE METABOLIC PANEL - Abnormal; Notable for the following:    Potassium 3.1 (*)    Chloride 96 (*)    Glucose, Bld 113 (*)     Albumin 3.1 (*)    AST 61 (*)    All other components within normal limits  CBC WITH DIFFERENTIAL/PLATELET  BRAIN NATRIURETIC PEPTIDE  TROPONIN I    Imaging Review Dg Chest 2 View  10/17/2015  CLINICAL DATA:  Bronchitis and shortness of breath since last Thursday. EXAM: CHEST  2 VIEW COMPARISON:  09/22/2015 FINDINGS: Cardiac enlargement without vascular congestion. Calcified and tortuous aorta. No focal airspace disease or consolidation in the lungs. No blunting of costophrenic angles. Mild emphysematous changes. No pneumothorax. Mild degenerative changes in the spine. IMPRESSION: Cardiac enlargement.  No evidence of active pulmonary disease. Electronically Signed   By: Lucienne Capers M.D.   On: 10/17/2015 06:06   I have personally reviewed and evaluated these images and lab results as part of my medical decision-making.   EKG Interpretation   Date/Time:  Tuesday October 17 2015 07:52:23 EST Ventricular Rate:  59 PR Interval:  174 QRS Duration: 123 QT Interval:  480 QTC Calculation: 475 R Axis:   -53 Text Interpretation:  Sinus rhythm Nonspecific IVCD with LAD Nonspecific T  abnormalities, lateral leads No significant change was found Confirmed by  CAMPOS  MD, KEVIN ([id number]) on 10/17/2015 7:57:34  AM      MDM   Final diagnoses:  Cough  SOB (shortness of breath)  Hypoxia  Hypokalemia  Dyspnea   Ceejay A Baldus presents with SOB and dyspnea.  Hypoxic to 88% on room air. Pt given total of 15 milligrams of albuterol and 1 mg of Atrovent along with 125 g of Solu-Medrol with some improvement. Patient continues to need 3 L of oxygen via nasal cannula to maintain oxygen saturations above 90%.  He does not require oxygen at home. No evidence of BNP, no pitting edema, negative troponin, normal BNP. Hypokalemia noted and repleted here in the department.  He did have a recent car trip however without peripheral edema or tachycardia and in the setting of URI and wheezing I doubt PE.  Patient  admitted.    Jarrett Soho Xavior Niazi, PA-C 10/17/15 [place], MD 10/17/15 1622

## 2015-10-17 NOTE — Progress Notes (Signed)
Admission note:  Arrival Method: Patient arrived in stretcher from ED. Mental Orientation:  Alert and oriented x 4. Telemetry: 6E-03, NSR 60. Assessment: See doc flow sheets. Skin: Warm, dry and healed scabs noted on both lower extremities at the ankle region,  Diabetic ulcer noted in between 2nd and 3rd and 4th toes on the left foot and also 2nd and 3rd on the right foot. IV: Right AC saline lock Pain: denies any pain Tubes: N/A Safety Measures: Bed in low position, call light and phone within reach. Fall Prevention Safety Plan: Reviewed the fall prevention safety, understood and acknowledged. Admission Screening: In progress. 6700 Orientation: Patient has been oriented to the unit, staff and to the room.

## 2015-10-17 NOTE — Progress Notes (Signed)
Patient refused CPAP for tonight 

## 2015-10-18 DIAGNOSIS — J4521 Mild intermittent asthma with (acute) exacerbation: Secondary | ICD-10-CM

## 2015-10-18 DIAGNOSIS — J4520 Mild intermittent asthma, uncomplicated: Secondary | ICD-10-CM

## 2015-10-18 DIAGNOSIS — J45901 Unspecified asthma with (acute) exacerbation: Secondary | ICD-10-CM | POA: Diagnosis present

## 2015-10-18 LAB — HIV ANTIBODY (ROUTINE TESTING W REFLEX): HIV Screen 4th Generation wRfx: NONREACTIVE

## 2015-10-18 LAB — BASIC METABOLIC PANEL
ANION GAP: 13 (ref 5–15)
BUN: 10 mg/dL (ref 6–20)
CHLORIDE: 98 mmol/L — AB (ref 101–111)
CO2: 29 mmol/L (ref 22–32)
Calcium: 9.5 mg/dL (ref 8.9–10.3)
Creatinine, Ser: 0.97 mg/dL (ref 0.61–1.24)
Glucose, Bld: 160 mg/dL — ABNORMAL HIGH (ref 65–99)
Potassium: 3.2 mmol/L — ABNORMAL LOW (ref 3.5–5.1)
Sodium: 140 mmol/L (ref 135–145)

## 2015-10-18 LAB — INFLUENZA PANEL BY PCR (TYPE A & B)
H1N1 flu by pcr: NOT DETECTED
Influenza A By PCR: NEGATIVE
Influenza B By PCR: NEGATIVE

## 2015-10-18 LAB — GLUCOSE, CAPILLARY: GLUCOSE-CAPILLARY: 133 mg/dL — AB (ref 65–99)

## 2015-10-18 MED ORDER — BENZONATATE 100 MG PO CAPS: 100.0000 mg | ORAL_CAPSULE | Freq: Two times a day (BID) | ORAL | Status: DC | PRN

## 2015-10-18 MED ORDER — GUAIFENESIN ER 600 MG PO TB12
600.0000 mg | ORAL_TABLET | Freq: Two times a day (BID) | ORAL | Status: DC | PRN
Start: 2015-10-18 — End: 2015-10-18
  Filled 2015-10-18: qty 1

## 2015-10-18 MED ORDER — IPRATROPIUM-ALBUTEROL 0.5-2.5 (3) MG/3ML IN SOLN: 3.0000 mL | Freq: Four times a day (QID) | RESPIRATORY_TRACT | Status: DC | PRN

## 2015-10-18 MED ORDER — BENZONATATE 100 MG PO CAPS
100.0000 mg | ORAL_CAPSULE | Freq: Three times a day (TID) | ORAL | Status: DC
  Administered 2015-10-18: 100 mg via ORAL
  Filled 2015-10-18: qty 1

## 2015-10-18 MED ORDER — IPRATROPIUM-ALBUTEROL 0.5-2.5 (3) MG/3ML IN SOLN
3.0000 mL | Freq: Four times a day (QID) | RESPIRATORY_TRACT | Status: DC
Start: 2015-10-18 — End: 2015-10-18
  Administered 2015-10-18 (×2): 3 mL via RESPIRATORY_TRACT
  Filled 2015-10-18 (×2): qty 3

## 2015-10-18 MED ORDER — ALBUTEROL SULFATE (5 MG/ML) 0.5% IN NEBU: 2.5000 mg | INHALATION_SOLUTION | Freq: Four times a day (QID) | RESPIRATORY_TRACT | Status: DC | PRN

## 2015-10-18 MED ORDER — MUPIROCIN CALCIUM 2 % EX CREA
TOPICAL_CREAM | Freq: Every day | CUTANEOUS | Status: DC
  Administered 2015-10-18: 13:00:00 via TOPICAL
  Filled 2015-10-18: qty 15

## 2015-10-18 MED ORDER — ALBUTEROL SULFATE HFA 108 (90 BASE) MCG/ACT IN AERS: 2.0000 | INHALATION_SPRAY | Freq: Four times a day (QID) | RESPIRATORY_TRACT | Status: DC | PRN

## 2015-10-18 MED ORDER — IPRATROPIUM-ALBUTEROL 0.5-2.5 (3) MG/3ML IN SOLN: 3.0000 mL | Freq: Three times a day (TID) | RESPIRATORY_TRACT | Status: DC

## 2015-10-18 MED ORDER — HYDROCERIN EX CREA
TOPICAL_CREAM | Freq: Every day | CUTANEOUS | Status: DC
  Administered 2015-10-18: 10:00:00 via TOPICAL
  Filled 2015-10-18: qty 113

## 2015-10-18 MED ORDER — PREDNISONE 10 MG PO TABS: 30.0000 mg | ORAL_TABLET | Freq: Every day | ORAL | Status: DC

## 2015-10-18 MED ORDER — POTASSIUM CHLORIDE CRYS ER 20 MEQ PO TBCR
40.0000 meq | EXTENDED_RELEASE_TABLET | Freq: Once | ORAL | Status: AC
  Administered 2015-10-18: 40 meq via ORAL
  Filled 2015-10-18: qty 2

## 2015-10-18 MED ORDER — GUAIFENESIN ER 600 MG PO TB12
600.0000 mg | ORAL_TABLET | Freq: Two times a day (BID) | ORAL | Status: DC
  Administered 2015-10-18: 600 mg via ORAL
  Filled 2015-10-18: qty 1

## 2015-10-18 NOTE — Discharge Instructions (Signed)
YOU HAVE BEEN TREATED FOR AN ASTHMA EXACERBATION. PLEASE TAKE PREDNISONE 30 MG (3 PILLS) EVERY MORNING STARTING TOMORROW MORNING FOR THE NEXT 8 DAYS.  WE HAVE PROVIDED YOU WITH A SPACER FOR YOUR ALBUTEROL INHALER AND A NEBULIZER MACHINE FOR CONTINUED USE OF YOUR ALBUTEROL MEDICATIONS.   PLEASE FOLLOW UP WITH YOUR PRIMARY CARE PHYSICIAN.   Asthma Attack Prevention While you may not be able to control the fact that you have asthma, you can take actions to prevent asthma attacks. The best way to prevent asthma attacks is to maintain good control of your asthma. You can achieve this by:  Taking your medicines as directed.  Avoiding things that can irritate your airways or make your asthma symptoms worse (asthma triggers).  Keeping track of how well your asthma is controlled and of any changes in your symptoms.  Responding quickly to worsening asthma symptoms (asthma attack).  Seeking emergency care when it is needed. WHAT ARE SOME WAYS TO PREVENT AN ASTHMA ATTACK? Have a Plan Work with your health care provider to create a written plan for managing and treating your asthma attacks (asthma action plan). This plan includes:  A list of your asthma triggers and how you can avoid them.  Information on when medicines should be taken and when their dosages should be changed.  The use of a device that measures how well your lungs are working (peak flow meter). Monitor Your Asthma Use your peak flow meter and record your results in a journal every day. A drop in your peak flow numbers on one or more days may indicate the start of an asthma attack. This can happen even before you start to feel symptoms. You can prevent an asthma attack from getting worse by following the steps in your asthma action plan. Avoid Asthma Triggers Work with your asthma health care provider to find out what your asthma triggers are. This can be done by:  Allergy testing.  Keeping a journal that notes when asthma  attacks occur and the factors that may have contributed to them.  Determining if there are other medical conditions that are making your asthma worse. Once you have determined your asthma triggers, take steps to avoid them. This may include avoiding excessive or prolonged exposure to:  Dust. Have someone dust and vacuum your home for you once or twice a week. Using a high-efficiency particulate arrestance (HEPA) vacuum is best.  Smoke. This includes campfire smoke, forest fire smoke, and secondhand smoke from tobacco products.  Pet dander. Avoid contact with animals that you know you are allergic to.  Allergens from trees, grasses or pollens. Avoid spending a lot of time outdoors when pollen counts are high, and on very windy days.  Very cold, dry, or humid air.  Mold.  Foods that contain high amounts of sulfites.  Strong odors.  Outdoor air pollutants, such as Lexic[employer].  Indoor air pollutants, such as aerosol sprays and fumes from household cleaners.  Household pests, including dust mites and cockroaches, and pest droppings.  Certain medicines, including NSAIDs. Always talk to your health care provider before stopping or starting any new medicines. Medicines Take over-the-counter and prescription medicines only as told by your health care provider. Many asthma attacks can be prevented by carefully following your medicine schedule. Taking your medicines correctly is especially important when you cannot avoid certain asthma triggers. Act Quickly If an asthma attack does happen, acting quickly can decrease how severe it is and how long it lasts. Take these  steps:   Pay attention to your symptoms. If you are coughing, wheezing, or having difficulty breathing, do not wait to see if your symptoms go away on their own. Follow your asthma action plan.  If you have followed your asthma action plan and your symptoms are not improving, call your health care provider or seek immediate  medical care at the nearest hospital. It is important to note how often you need to use your fast-acting rescue inhaler. If you are using your rescue inhaler more often, it may mean that your asthma is not under control. Adjusting your asthma treatment plan may help you to prevent future asthma attacks and help you to gain better control of your condition. HOW CAN I PREVENT AN ASTHMA ATTACK WHEN I EXERCISE? Follow advice from your health care provider about whether you should use your fast-acting inhaler before exercising. Many people with asthma experience exercise-induced bronchoconstriction (EIB). This condition often worsens during vigorous exercise in cold, humid, or dry environments. Usually, people with EIB can stay very active by pre-treating with a fast-acting inhaler before exercising.   This information is not intended to replace advice given to you by your health care provider. Make sure you discuss any questions you have with your health care provider.   Document Released: 08/07/2009 Document Revised: 05/10/2015 Document Reviewed: 01/19/2015 Elsevier Interactive Patient Education [employer].

## 2015-10-18 NOTE — Care Management Note (Signed)
Case Management Note  Patient Details  Name: [name] [name] MRN: [id number] Date of Birth: 1946-06-16  Subjective/Objective:         CM following for progression and d/c planning.           Action/Plan: 10/18/2015 Met with pt and OBS notification signed, OBS status explained to pt and he expressed understanding. CC44 completed. Noted Neb machine ordered, [place] notified of plan to d/c pt today. Pt familiar with neb machine as he has previously had a neb.   Expected Discharge Date:    10/18/2015              Expected Discharge Plan:  Home/Self Care  In-House Referral:  NA  Discharge planning Services  CM Consult  Post Acute Care Choice:  Durable Medical Equipment Choice offered to:  Patient  DME Arranged:  Nebulizer machine DME Agency:  [place]:  NA [place] Agency:  NA  Status of Service:  Completed, signed off  Medicare Important Message Given:    Date Medicare IM Given:    Medicare IM give by:    Date Additional Medicare IM Given:    Additional Medicare Important Message give by:     If discussed at [place] of Stay Meetings, dates discussed:    Additional Comments:  Adron Bene, RN 10/18/2015, 12:17 PM

## 2015-10-18 NOTE — H&P (Signed)
Internal Medicine Attending Admission Note Date: 10/18/2015  Patient name: [name] [name] Medical record number: [id number] Date of birth: 26-May-1946 Age: 70 y.o. Gender: male  I saw and evaluated the patient. I reviewed the resident's note and I agree with the resident's findings and plan as documented in the resident's note.  Chief Complaint(s): Increasing shortness of breath and productive cough 1 week.  History - key components related to admission:  [name] [name] is a 70 year old man with a history of asthma, obstructive sleep apnea, hypertension, and diet-controlled diabetes who presents with a one-week history of cough and progressive dyspnea. During this time he had intermittent shakes and chills. He denies any recent sick contacts, chest pain, myalgias, or lower extremity edema. He admits that his nebulizer machine recently broke and when he feels this way use of his nebulizer machine improves his shortness of breath. He also uses an albuterol MDI on an as-needed basis but does not use a spacer. When we reviewed his inhaler technique there were several flaws throughout several steps. Thus, with a broken nebulizer machine and very poor inhaler technique he has not been receiving his bronchodilator therapy recently. He presented to the emergency department with the above history and complaints and was admitted to the internal medicine teaching service for further evaluation and care.  Overnight after steroids and bronchodilators his symptoms markedly improved and he felt he was back to his baseline. He is currently without complaints.  Physical Exam - key components related to admission:  Filed Vitals:   10/17/15 2150 10/18/15 0229 10/18/15 0511 10/18/15 1021  BP: 142/71  149/83 146/83  Pulse: 70 64 64 62  Temp: 98.1 F (36.7 C)  98 F (36.7 C) 97.9 F (36.6 C)  TempSrc: Oral  Oral Oral  Resp: 19 18 18 18   Height:      Weight:      SpO2: 94%  95% 95%   Gen.: Well-developed,  well-nourished, man lying comfortably in bed in no acute distress. Lungs: Occasional scattered wheezes particularly in the bilateral bases with good air movement and minimal prolongation of the expiratory phase.  Lab results:  Basic Metabolic Panel:  Recent Labs  10/17/15 0624 10/18/15 0659  NA 137 140  K 3.1* 3.2*  CL 96* 98*  CO2 28 29  GLUCOSE 113* 160*  BUN 12 10  CREATININE 0.84 0.97  CALCIUM 8.9 9.5   Liver Function Tests:  Recent Labs  10/17/15 0624  AST 61*  ALT 53  ALKPHOS 90  BILITOT 1.0  PROT 7.6  ALBUMIN 3.1*   CBC:  Recent Labs  10/17/15 0624  WBC 5.2  NEUTROABS 3.2  HGB 13.1  HCT 39.0  MCV 90.7  PLT 207   Cardiac Enzymes:  Recent Labs  10/17/15 0745  TROPONINI <0.03   CBG:  Recent Labs  10/17/15 1649 10/18/15 0107  GLUCAP 242* 133*   Thyroid Function Tests:  Recent Labs  10/17/15 1524  TSH 0.467   Misc. Labs:  BNP 22.3 ABG on unknown FiO2 7.39/42/64 HIV antibody nonreactive Influenza A, influenza B, H1N1 flu negative by PCR  Imaging results:  Dg Chest 2 View  10/17/2015  CLINICAL DATA:  Bronchitis and shortness of breath since last Thursday. EXAM: CHEST  2 VIEW COMPARISON:  09/22/2015 FINDINGS: Cardiac enlargement without vascular congestion. Calcified and tortuous aorta. No focal airspace disease or consolidation in the lungs. No blunting of costophrenic angles. Mild emphysematous changes. No pneumothorax. Mild degenerative changes in the spine. IMPRESSION: Cardiac enlargement.  No evidence of active pulmonary disease. Electronically Signed   By: Lucienne Capers M.D.   On: 10/17/2015 06:06   Other results:  EKG: Normal sinus bradycardia at 55 bpm with occasional PACs, left axis deviation, normal intervals, no significant Q waves, no LVH by voltage, delayed R wave progression, no ST segment changes, inferolateral T wave inversions in a strain pattern. No significant changes from the previous ECG on 09/22/2015.  Assessment  & Plan by Problem:  [name] [name] is a 70 year old man with a history of asthma, obstructive sleep apnea, hypertension, and diet-controlled diabetes who presents with a one-week history of cough productive of a white sputum and progressive dyspnea. This was associated with a recently broken nebulizer machine and very poor MDI technique. He likely developed a viral bronchitis which exacerbated his asthma. At baseline it sounds like he has mild intermittent asthma. With this exacerbation and the inability to access bronchodilators he became progressively more symptomatic.  1) Mild intermittent asthma with an acute exacerbation secondary to a viral bronchitis: We have taught him the appropriate MDI technique for his as needed albuterol. We have also provided him with a spacer. He will have a nebulizer machine delivered to his house for use as well. We will provide him with an another 8 days of prednisone 30 mg daily for his asthma exacerbation.  2) Disposition: He is stable for discharge home with follow-up in his primary care provider's office.

## 2015-10-18 NOTE — Progress Notes (Signed)
Subjective:  Patient doing well this AM with no acute events overnight. He notes significant improvement in his breathing and does not have any remaining complaints. Prior to admission he had worsening orthopnea requiring him to sit sleeping up over the past few nights however he was able to sleep with just two pillows in a supine position last night.  Objective: Vital signs in last 24 hours: Filed Vitals:   10/17/15 2150 10/18/15 0229 10/18/15 0511 10/18/15 1021  BP: 142/71  149/83 146/83  Pulse: 70 64 64 62  Temp: 98.1 F (36.7 C)  98 F (36.7 C) 97.9 F (36.6 C)  TempSrc: Oral  Oral Oral  Resp: 19 18 18 18   Height:      Weight:      SpO2: 94%  95% 95%   Weight change:   Intake/Output Summary (Last 24 hours) at 10/18/15 1154 Last data filed at 10/18/15 0600  Gross per 24 hour  Intake    600 ml  Output    575 ml  Net     25 ml   BP 146/83 mmHg  Pulse 62  Temp(Src) 97.9 F (36.6 C) (Oral)  Resp 18  Ht 5\' 9"  (1.753 m)  Wt 109.317 kg (241 lb)  BMI 35.57 kg/m2  SpO2 95%  General Appearance:    Alert, cooperative, no distress  Head:    Normocephalic, without obvious abnormality, atraumatic  Lungs:     Clear to auscultation bilaterally, respirations unlabored  Chest wall:    No tenderness or deformity  Heart:    Regular rate and rhythm, S1 and S2 normal, no murmur, rub   or gallop  Abdomen:     Soft, non-tender, bowel sounds active,    no masses, no organomegaly  Extremities:   Chronic appearing ulcers on left lower ankle   Lab Results:  Basic Metabolic Panel:  Recent Labs  10/17/15 0624 10/18/15 0659  NA 137 140  K 3.1* 3.2*  CL 96* 98*  CO2 28 29  GLUCOSE 113* 160*  BUN 12 10  CREATININE 0.84 0.97  CALCIUM 8.9 9.5   Liver Function Tests:  Recent Labs  10/17/15 0624  AST 61*  ALT 53  ALKPHOS 90  BILITOT 1.0  PROT 7.6  ALBUMIN 3.1*   CBC:  Recent Labs  10/17/15 0624  WBC 5.2  NEUTROABS 3.2  HGB 13.1  HCT 39.0  MCV 90.7  PLT 207    Cardiac Enzymes:  Recent Labs  10/17/15 0745  TROPONINI <0.03   CBG:  Recent Labs  10/17/15 1649 10/18/15 0107  GLUCAP 242* 133*   Thyroid Function Tests:  Recent Labs  10/17/15 1524  TSH 0.467   Micro Results: No results found for this or any previous visit (from the past 240 hour(s)). Studies/Results: Dg Chest 2 View  10/17/2015  CLINICAL DATA:  Bronchitis and shortness of breath since last Thursday. EXAM: CHEST  2 VIEW COMPARISON:  09/22/2015 FINDINGS: Cardiac enlargement without vascular congestion. Calcified and tortuous aorta. No focal airspace disease or consolidation in the lungs. No blunting of costophrenic angles. Mild emphysematous changes. No pneumothorax. Mild degenerative changes in the spine. IMPRESSION: Cardiac enlargement.  No evidence of active pulmonary disease. Electronically Signed   By: [name] [name] M.D.   On: 10/17/2015 06:06   Medications: I have reviewed the patient's current medications.  Scheduled Meds: . amLODipine  10 mg Oral Daily  . benzonatate  100 mg Oral TID  . enoxaparin (LOVENOX) injection  40 mg Subcutaneous  Q24H  . guaiFENesin  600 mg Oral BID  . hydrocerin   Topical Daily  . ipratropium-albuterol  3 mL Nebulization Q6H  . mupirocin cream   Topical Daily  . pneumococcal 23 valent vaccine  0.5 mL Intramuscular Tomorrow-1000  . predniSONE  40 mg Oral Q breakfast  . sodium chloride flush  3 mL Intravenous Q12H  . tamsulosin  0.8 mg Oral Daily   PRN Meds:.acetaminophen **OR** acetaminophen, ipratropium-albuterol, sodium chloride flush Assessment/Plan: Principal Problem:   Asthma with acute exacerbation Active Problems:   Cardiac conduction disorder   Benign prostatic hypertrophy without urinary obstruction   Diabetes (HCC)   Asthma exacerbation  Please note, patient will be discharged today and was only inpatient for 1 day so this A/P is written as a hospital course.  [name] is a 70 yo M with history of HTN, diet  controlled DM, HLD, aortic root dilatation, aortic insufficiency, obstructive sleep apnea and mild intermittent asthma who presented due to a 1 week history of cough and dyspnea. His symptoms and presentation were most consistent with a viral URI as he complained for chest congestion, cough productive of thick non-bloody sputum and new orthopnea. Patient has an albuterol inhaler prn that he normally uses infrequently but recently used BID. Notably, he states that a component of his nebulizer broke and it is apparent at bedside that he is not using his inhaler correctly. One day prior to admission he saw his PCP who prescribed prednisone and azithromycin however he did not improve prompting presentation to [place]. Given these symptoms he most likely had an exacerbation of mild intermittent asthma perhaps in the context of a viral URI or viral bronchitis. In the ED, patient was given albuterol, ipratropium nebulizer, solumedrol IV and reported improvement while still in the ED. Overnight, we managed his mild intermittent asthma with prednisone, duonebs, tylenol. Antibiotics were not indicated and thus not prescribed. By AM patient felt much improved with improved respiratory status and decreased orthopnea. His O2 saturation was 95% on room air this morning and he is able to sit up and ambulate without SOB indicating that his is back to baseline and safe for discharge.  Following discharge we recommend the following:  Mild Intermittent Asthma: Patient was not able to use home medications to treat this exacerbation due to lack of appropriate medical equipment. We have ordered a nebulizer and provided a spacer as well as delivered teaching to help empower patient to manage mild exacerbations with albuterol prn. Based on patient's history, CXR and exam this process is not consistent with a bacterial process thus antibiotics are not indicated. Please consider ordering PFTs as outpatient after resolution of this  exacerbation. We are discharging with a short course of prednisone to help with airway inflammation and reactivity. - Albuterol prn - Nebulizer machine to be delivered to home - Spacer provided - Prednisone 30 mg daily X 8 days  Irregular heart rate/ Aortic dilatation: Patient has history of aortic root dilatation and was noted on exam to have an irregular rhythm. His presentation and imaging were not concerning for complications of this problem. He is scheduled for outpatient follow up on 2/20 OFFICE VISIT, 11:30 AM, Minus Breeding, MD, [facility] and per EMR will be undergoing MRI studies for further evaluation. - Follow up appointment with cardiologist  HTN: Well managed with home amlodipine 20mg  daily.   DM: Diet controlled. No interventions while inpatient. Please consider whether checking blood glucose levels would be helpful while patient  is on steroids.  BPH: Continued on home meds while inpatient. - Continue tamsulosin 0.8mg  QD  Dispo: Patient is ready for discharge.  This is a Careers information officer Note.  The care of the patient was discussed with Dr. Marvel Plan and the assessment and plan formulated with their assistance.  Please see their attached note for official documentation of the daily encounter.   LOS: 1 day   [employer], Med Student 10/18/2015, 11:54 AM

## 2015-10-18 NOTE — Discharge Summary (Signed)
Name: [name] [name] MRN: [id number] DOB: [birth year] 70 y.o. PCP: Vicenta Aly, FNP  Date of Admission: 10/17/2015  6:55 AM Date of Discharge: 10/18/2015 Attending Physician: Dr. Eppie Gibson  Discharge Diagnosis:  Principal Problem:   Asthma exacerbation Active Problems:   Benign prostatic hypertrophy without urinary obstruction   Aortic root dilatation (HCC)   Diabetes (HCC)   Mild intermittent asthma  Discharge Medications:   Medication List    STOP taking these medications        azithromycin 250 MG tablet  Commonly known as:  ZITHROMAX     LORazepam 0.5 MG tablet  Commonly known as:  ATIVAN      TAKE these medications        albuterol 108 (90 Base) MCG/ACT inhaler  Commonly known as:  PROVENTIL HFA;VENTOLIN HFA  Inhale 2 puffs into the lungs every 6 (six) hours as needed for wheezing or shortness of breath.     albuterol (5 MG/ML) 0.5% nebulizer solution  Commonly known as:  PROVENTIL  Take 0.5 mLs (2.5 mg total) by nebulization every 6 (six) hours as needed for wheezing or shortness of breath.     amLODipine 10 MG tablet  Commonly known as:  NORVASC  Take 10 mg by mouth 2 (two) times daily.     potassium chloride 10 MEQ tablet  Commonly known as:  K-DUR  Take 10 mEq by mouth daily.     predniSONE 10 MG tablet  Commonly known as:  DELTASONE  Take 3 tablets (30 mg total) by mouth daily with breakfast.     tamsulosin 0.4 MG Caps capsule  Commonly known as:  FLOMAX  Take 0.8 mg by mouth daily.        Disposition and follow-up:   [name] [name] was discharged from [facility] in Good condition.  At the hospital follow up visit please address:  1.  Mild Intermittent Asthma with Acute Exacerbation: Please ensure patient has completed steroid course and is compliant with nebulizer and inhalers.   2.  Labs / imaging needed at time of follow-up: None  3.  Pending labs/ test needing follow-up: None  Follow-up Appointments: Follow-up  Information    Follow up with Vicenta Aly, FNP. Go on 11/01/2015.   Specialty:  Nurse Practitioner   Why:  11:15 am   Contact information:   [place] [phone]       Discharge Instructions: Discharge Instructions    Diet - low sodium heart healthy    Complete by:  As directed      Increase activity slowly    Complete by:  As directed            Consultations:  None  Procedures Performed:  Dg Chest 2 View  10/17/2015  CLINICAL DATA:  Bronchitis and shortness of breath since last Thursday. EXAM: CHEST  2 VIEW COMPARISON:  09/22/2015 FINDINGS: Cardiac enlargement without vascular congestion. Calcified and tortuous aorta. No focal airspace disease or consolidation in the lungs. No blunting of costophrenic angles. Mild emphysematous changes. No pneumothorax. Mild degenerative changes in the spine. IMPRESSION: Cardiac enlargement.  No evidence of active pulmonary disease. Electronically Signed   By: Lucienne Capers M.D.   On: 10/17/2015 06:06   Dg Chest 2 View  09/22/2015  CLINICAL DATA:  Chest fluttering and shortness of breath. EXAM: CHEST - 2 VIEW COMPARISON:  Two-view chest x-ray 06/02/2015 FINDINGS: The heart is enlarged. There is no edema or effusion  to suggest failure. Degenerative changes are present in the thoracic spine. The visualized soft tissues and bony thorax are otherwise unremarkable. IMPRESSION: No acute cardiopulmonary disease. Electronically Signed   By: San Morelle M.D.   On: 09/22/2015 15:17   Admission HPI: [name] [name] is a 70 year old male with PMH of HTN, diet controlled DM, HLD, aortic root dilatation, aortic insufficiency, OSA and hx of RAD here with cough and dyspnea x 1 week. He says he initially began to feel warm while driving back from his brother's funeral five days ago (last Thursday). He thought it was due to warm weather outside. Once he got home, he experienced chills and symptoms progressed to  include chest congestion, cough productive of sputum (sometimes thick but non bloody) and dyspnea. He normally sleeps with four pillows but has had to sleep sitting up the past few nights due to orthopnea. He denies sore throat, rhinorrhea, sneezing, vision change, photophobia, neck pain/stiffness, myalgias, headache, chest pain, LE edema or sick contacts. He drove approximately 3.5 hours each way to his brother's funeral in [place], [place] last week. He denies hx of cancer, recent surgery or prolonged immobility. He has been using prn albuterol inhaler twice per day in recent days but normally never needs to use it. He went to his PCP yesterday and was prescribed azithromycin and prednisone.   He feels this symptoms are due to "bronchitis" but he says he has not had PFTs in a long time and he is not sure if he carries the dx of COPD. He reports childhood asthma that he grew out of.   In the ED: T 97.70F, RR 12-22, BP 130s-140s/70s-90s, HR 60-120s, he de-satted to low 80s on RA but improved to 94% on 4L. He received albuterol and ipratropium nebs, 164mcg IV solumedrol and Kdur. He tells me he already feels breathing is better after nebulizers and IV steroid.   Hospital Course by problem list: Principal Problem:   Asthma exacerbation Active Problems:   Benign prostatic hypertrophy without urinary obstruction   Aortic root dilatation (HCC)   Diabetes (HCC)   Mild intermittent asthma   Acute Asthma Exacerbation:Patient's presentation is consistent with an acute exacerbation of his underlying mild intermittent asthma, likely precipitated by a viral URI and medication non-adherence due to a broken nebulizer machine. On the morning of discharge, patient was 95% on room air this morning. He was ambulating well without dyspnea and feels that he was back to his baseline. Patient was provided with a spacer for his albuterol inhaler and DME order for home nebulizer. [facility] will deliver the nebulizer machine  to him on discharge. On discharge, patient was prescribed prednisone 30 mg daily for 8 more days. We continued his home albuterol inhaler and nebulizer prn for mild intermittent asthma.   Aortic Root Dilatation with Mild Aortic Insufficiency: No active issues. Patient follows with Dr. Percival Spanish as outpatient who recommended at a MRI at a future date.   [id number] controlled during admission. On amlodipine 10 mg daily at home. This was continued on discharge.   T2DM:Diet controlled, but unknown hemoglobin A1c. CBGs were well controlled during admission.  BPH: Patient is on tamsulosin 0.8 mg QD at home. This was continued on discharge.   Discharge Vitals:   BP 146/83 mmHg  Pulse 62  Temp(Src) 97.9 F (36.6 C) (Oral)  Resp 18  Ht 5\' 9"  (1.753 m)  Wt 241 lb (109.317 kg)  BMI 35.57 kg/m2  SpO2 95%  Discharge Labs:  Results for  orders placed or performed during the hospital encounter of 10/17/15 (from the past 24 hour(s))  Glucose, capillary     Status: Abnormal   Collection Time: 10/18/15  1:07 AM  Result Value Ref Range   Glucose-Capillary 133 (H) 65 - 99 mg/dL  Basic metabolic panel     Status: Abnormal   Collection Time: 10/18/15  6:59 AM  Result Value Ref Range   Sodium 140 135 - 145 mmol/L   Potassium 3.2 (L) 3.5 - 5.1 mmol/L   Chloride 98 (L) 101 - 111 mmol/L   CO2 29 22 - 32 mmol/L   Glucose, Bld 160 (H) 65 - 99 mg/dL   BUN 10 6 - 20 mg/dL   Creatinine, Ser 0.97 0.61 - 1.24 mg/dL   Calcium 9.5 8.9 - 10.3 mg/dL   GFR calc non Af Amer >60 >60 mL/min   GFR calc Af Amer >60 >60 mL/min   Anion gap 13 5 - 15    Signed: Osa Craver, DO PGY-2 Internal Medicine Resident Pager # [phone] 10/18/2015 8:51 PM  Services Ordered on Discharge: None Equipment Ordered on Discharge: DME Nebulizer, Spacer provided

## 2015-10-18 NOTE — Progress Notes (Signed)
Discharge instructions and medications discussed with patient.  All questions answered.  

## 2015-10-18 NOTE — Consult Note (Addendum)
WOC wound consult note Reason for Consult: Consult requested for bilat legs and feet. Wound type: Pt states he previously had several open wounds to these locations which have healed. Measurement: Webbed areas between toes is white, macerated and moist, but no open wounds visible at this time. Pt states he had a large amt edema and weeping when he was admitted which has improved.  Calf and ankle areas with several previous wounds which have healed; white scar tissue visible, no open wounds or drainage.  Dry scaly skin in scattered patchy areas, which patient states is very itchy. Dressing procedure/placement/frequency: Bactroban between toes to provide antimicrobial benefits.  Eucerin cream to legs to reduce itching and promote moist healing of scaley skin.  Discussed plan of care with patient and he denies further questions. Please re-consult if further assistance is needed.  Thank-you,  Julien Girt MSN, [place], [place], [place], [place]

## 2015-10-18 NOTE — Progress Notes (Signed)
Subjective:  Patient was seen and examined this morning. He reports his shortness of breath and wheezing are much improved from yesterday. He feels he is back at his baseline as he was able to walk around without difficultly or shortness of breath. He denies orthopnea, weight gain or increased lower extremity swelling. He denies cough.    Objective: Vital signs in last 24 hours: Filed Vitals:   10/17/15 2150 10/18/15 0229 10/18/15 0511 10/18/15 1021  BP: 142/71  149/83 146/83  Pulse: 70 64 64 62  Temp: 98.1 F (36.7 C)  98 F (36.7 C) 97.9 F (36.6 C)  TempSrc: Oral  Oral Oral  Resp: 19 18 18 18   Height:      Weight:      SpO2: 94%  95% 95%   Weight change:   Intake/Output Summary (Last 24 hours) at 10/18/15 1120 Last data filed at 10/18/15 0600  Gross per 24 hour  Intake    600 ml  Output    575 ml  Net     25 ml   General: Vital signs reviewed.  Patient is in no acute distress and cooperative with exam.  Eyes: +Bilateral proptosis Cardiovascular: RRR, S1 normal, S2 normal. Pulmonary/Chest: Mild scattered expiratory wheezes, no rales, or rhonchi. Abdominal: Soft, non-tender, non-distended, BS +.  Extremities: 1+ non-pitting edema bilaterally Skin: Dry, scaly skin. Previous wounds in healing stages on lower extremities.   Lab Results: Basic Metabolic Panel:  Recent Labs Lab 10/17/15 0624 10/18/15 0659  NA 137 140  K 3.1* 3.2*  CL 96* 98*  CO2 28 29  GLUCOSE 113* 160*  BUN 12 10  CREATININE 0.84 0.97  CALCIUM 8.9 9.5   Liver Function Tests:  Recent Labs Lab 10/17/15 0624  AST 61*  ALT 53  ALKPHOS 90  BILITOT 1.0  PROT 7.6  ALBUMIN 3.1*   CBC:  Recent Labs Lab 10/17/15 0624  WBC 5.2  NEUTROABS 3.2  HGB 13.1  HCT 39.0  MCV 90.7  PLT 207   Cardiac Enzymes:  Recent Labs Lab 10/17/15 0745  TROPONINI <0.03   CBG:  Recent Labs Lab 10/17/15 1649 10/18/15 0107  GLUCAP 242* 133*   Thyroid Function Tests:  Recent Labs Lab  10/17/15 1524  TSH 0.467   Studies/Results: Dg Chest 2 View  10/17/2015  CLINICAL DATA:  Bronchitis and shortness of breath since last Thursday. EXAM: CHEST  2 VIEW COMPARISON:  09/22/2015 FINDINGS: Cardiac enlargement without vascular congestion. Calcified and tortuous aorta. No focal airspace disease or consolidation in the lungs. No blunting of costophrenic angles. Mild emphysematous changes. No pneumothorax. Mild degenerative changes in the spine. IMPRESSION: Cardiac enlargement.  No evidence of active pulmonary disease. Electronically Signed   By: Lucienne Capers M.D.   On: 10/17/2015 06:06   Medications:  I have reviewed the patient's current medications. Prior to Admission:  Prescriptions prior to admission  Medication Sig Dispense Refill Last Dose  . albuterol (PROVENTIL HFA;VENTOLIN HFA) 108 (90 BASE) MCG/ACT inhaler Inhale 1-2 puffs into the lungs every 6 (six) hours as needed for wheezing or shortness of breath. 1 Inhaler 0 10/17/2015 at Unknown time  . albuterol (PROVENTIL) (5 MG/ML) 0.5% nebulizer solution Take 0.5 mLs (2.5 mg total) by nebulization every 6 (six) hours as needed for wheezing or shortness of breath. 20 mL 12 10/16/2015 at Unknown time  . amLODipine (NORVASC) 10 MG tablet Take 10 mg by mouth 2 (two) times daily.    10/17/2015 at Unknown time  .  azithromycin (ZITHROMAX) 250 MG tablet Take 250 mg by mouth.   10/17/2015 at Unknown time  . potassium chloride (K-DUR) 10 MEQ tablet Take 10 mEq by mouth daily.   10/15/2015 at Unknown time  . predniSONE (DELTASONE) 20 MG tablet Take 20 mg by mouth daily. Reported on 10/17/2015   10/17/2015 at Unknown time  . tamsulosin (FLOMAX) 0.4 MG CAPS capsule Take 0.8 mg by mouth daily.   Past Week at Unknown time  . LORazepam (ATIVAN) 0.5 MG tablet Take 2 tablets (1 mg total) by mouth every 8 (eight) hours as needed for anxiety. (Patient not taking: Reported on 10/17/2015) 20 tablet 0 Not Taking at Unknown time   Scheduled Meds: . amLODipine   10 mg Oral Daily  . benzonatate  100 mg Oral TID  . enoxaparin (LOVENOX) injection  40 mg Subcutaneous Q24H  . guaiFENesin  600 mg Oral BID  . hydrocerin   Topical Daily  . ipratropium-albuterol  3 mL Nebulization Q6H  . mupirocin cream   Topical Daily  . pneumococcal 23 valent vaccine  0.5 mL Intramuscular Tomorrow-1000  . predniSONE  40 mg Oral Q breakfast  . sodium chloride flush  3 mL Intravenous Q12H  . tamsulosin  0.8 mg Oral Daily   Continuous Infusions:  PRN Meds:.acetaminophen **OR** acetaminophen, ipratropium-albuterol, sodium chloride flush Assessment/Plan: Principal Problem:   Asthma with acute exacerbation Active Problems:   Cardiac conduction disorder   Benign prostatic hypertrophy without urinary obstruction   Diabetes (HCC)   Asthma exacerbation  Acute Asthma Exacerbation:Patient's presentation is consistent with an acute exacerbation of his underlying mild intermittent asthma, likely precipitated by a viral URI and medication non-adherence due to a broken nebulizer machine. Patient is 95% on room air this morning. He is ambulating well without dyspnea and feels that he is back to his baseline. Patient is safe to be discharged to home today. We will provide him a spacer for his albuterol inhaler and DME order for home nebulizer.  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 Patient follows with Dr. Percival Spanish as outpatient who recommended that he will quantify the size with MRI at a future date.  -Follow up with Cardiology  HTN:147/71. On amlodipine 10 mg daily at home. -Continue amlodipine 10 mg daily  T2DM:Diet controlled, but unknown hemoglobin A1c.  -Carb Modified Diet -Follow up with PCP  BPH: Patient is on tamsulosin 0.8 mg QD at  home.  -Continue tamsulosin 0.8 mg QD  DVT/PE ppx: Lovenox SQ QD  Dispo: Anticipated discharge in approximately 0 day(s).   The patient does have a current PCP Vicenta Aly, [place]) and does not need an [facility] hospital follow-up appointment after discharge.  The patient does not have transportation limitations that hinder transportation to clinic appointments.   LOS: 1 day   Osa Craver, DO PGY-2 Internal Medicine Resident Pager # [phone] 10/18/2015 11:20 AM

## 2015-10-19 NOTE — Care Management Obs Status (Signed)
MEDICARE OBSERVATION STATUS NOTIFICATION Late entry, notification given 10/17/2014 and signed by pt   Patient Details  Name: [name] [name] MRN: [id number] Date of Birth: Oct 25, 1945   Medicare Observation Status Notification Given:  Yes   Condition Code 44 also explained to pt.  Richanda Darin, Rory Percy, RN 10/19/2015, 12:17 PM

## 2015-10-23 ENCOUNTER — Ambulatory Visit: Payer: Medicare Other | Admitting: Cardiology

## 2015-12-14 ENCOUNTER — Ambulatory Visit: Payer: Medicare Other

## 2015-12-14 ENCOUNTER — Ambulatory Visit (INDEPENDENT_AMBULATORY_CARE_PROVIDER_SITE_OTHER): Payer: Medicare Other | Admitting: Podiatry

## 2015-12-14 ENCOUNTER — Encounter: Payer: Self-pay | Admitting: Podiatry

## 2015-12-14 ENCOUNTER — Ambulatory Visit (INDEPENDENT_AMBULATORY_CARE_PROVIDER_SITE_OTHER): Payer: Medicare Other

## 2015-12-14 VITALS — BP 127/89 | HR 64 | Resp 16

## 2015-12-14 DIAGNOSIS — M79672 Pain in left foot: Secondary | ICD-10-CM

## 2015-12-14 DIAGNOSIS — L84 Corns and callosities: Secondary | ICD-10-CM | POA: Diagnosis not present

## 2015-12-16 ENCOUNTER — Encounter (HOSPITAL_COMMUNITY): Payer: Self-pay | Admitting: Emergency Medicine

## 2015-12-16 ENCOUNTER — Inpatient Hospital Stay (HOSPITAL_COMMUNITY)
Admission: EM | Admit: 2015-12-16 | Discharge: 2015-12-20 | DRG: 281 | Disposition: A | Discharge status: Home / Self Care | Payer: Medicare Other | Attending: Internal Medicine | Admitting: Internal Medicine

## 2015-12-16 ENCOUNTER — Emergency Department (HOSPITAL_COMMUNITY): Payer: Medicare Other

## 2015-12-16 DIAGNOSIS — I208 Other forms of angina pectoris: Secondary | ICD-10-CM | POA: Diagnosis present

## 2015-12-16 DIAGNOSIS — J449 Chronic obstructive pulmonary disease, unspecified: Secondary | ICD-10-CM | POA: Diagnosis present

## 2015-12-16 DIAGNOSIS — I200 Unstable angina: Principal | ICD-10-CM | POA: Diagnosis present

## 2015-12-16 DIAGNOSIS — R0602 Shortness of breath: Secondary | ICD-10-CM | POA: Diagnosis not present

## 2015-12-16 DIAGNOSIS — E785 Hyperlipidemia, unspecified: Secondary | ICD-10-CM | POA: Diagnosis present

## 2015-12-16 DIAGNOSIS — I214 Non-ST elevation (NSTEMI) myocardial infarction: Secondary | ICD-10-CM | POA: Diagnosis present

## 2015-12-16 DIAGNOSIS — Z8249 Family history of ischemic heart disease and other diseases of the circulatory system: Secondary | ICD-10-CM

## 2015-12-16 DIAGNOSIS — R079 Chest pain, unspecified: Secondary | ICD-10-CM | POA: Diagnosis present

## 2015-12-16 DIAGNOSIS — E876 Hypokalemia: Secondary | ICD-10-CM | POA: Diagnosis present

## 2015-12-16 DIAGNOSIS — I10 Essential (primary) hypertension: Secondary | ICD-10-CM | POA: Diagnosis present

## 2015-12-16 DIAGNOSIS — E119 Type 2 diabetes mellitus without complications: Secondary | ICD-10-CM | POA: Diagnosis present

## 2015-12-16 DIAGNOSIS — I110 Hypertensive heart disease with heart failure: Secondary | ICD-10-CM | POA: Diagnosis present

## 2015-12-16 DIAGNOSIS — Z87891 Personal history of nicotine dependence: Secondary | ICD-10-CM

## 2015-12-16 DIAGNOSIS — I77810 Thoracic aortic ectasia: Secondary | ICD-10-CM | POA: Diagnosis present

## 2015-12-16 DIAGNOSIS — Z880 Allergy status to penicillin: Secondary | ICD-10-CM

## 2015-12-16 DIAGNOSIS — I480 Paroxysmal atrial fibrillation: Secondary | ICD-10-CM | POA: Diagnosis present

## 2015-12-16 DIAGNOSIS — Z79899 Other long term (current) drug therapy: Secondary | ICD-10-CM

## 2015-12-16 DIAGNOSIS — I509 Heart failure, unspecified: Secondary | ICD-10-CM | POA: Diagnosis present

## 2015-12-16 DIAGNOSIS — C9000 Multiple myeloma not having achieved remission: Secondary | ICD-10-CM | POA: Diagnosis present

## 2015-12-16 DIAGNOSIS — I493 Ventricular premature depolarization: Secondary | ICD-10-CM | POA: Diagnosis present

## 2015-12-16 HISTORY — DX: Atherosclerotic heart disease of native coronary artery without angina pectoris: I25.10

## 2015-12-16 HISTORY — DX: Paroxysmal atrial fibrillation: I48.0

## 2015-12-16 LAB — CBC
HEMATOCRIT: 37 % — AB (ref 39.0–52.0)
HEMOGLOBIN: 12.1 g/dL — AB (ref 13.0–17.0)
MCH: 29.8 pg (ref 26.0–34.0)
MCHC: 32.7 g/dL (ref 30.0–36.0)
MCV: 91.1 fL (ref 78.0–100.0)
Platelets: 291 10*3/uL (ref 150–400)
RBC: 4.06 MIL/uL — ABNORMAL LOW (ref 4.22–5.81)
RDW: 12.9 % (ref 11.5–15.5)
WBC: 9.5 10*3/uL (ref 4.0–10.5)

## 2015-12-16 LAB — BRAIN NATRIURETIC PEPTIDE: B NATRIURETIC PEPTIDE 5: 46.1 pg/mL (ref 0.0–100.0)

## 2015-12-16 LAB — BASIC METABOLIC PANEL
ANION GAP: 10 (ref 5–15)
BUN: 13 mg/dL (ref 6–20)
CALCIUM: 9 mg/dL (ref 8.9–10.3)
CO2: 26 mmol/L (ref 22–32)
Chloride: 101 mmol/L (ref 101–111)
Creatinine, Ser: 1.1 mg/dL (ref 0.61–1.24)
GFR calc Af Amer: >60 mL/min (ref >60)
Glucose, Bld: 114 mg/dL — ABNORMAL HIGH (ref 65–99)
POTASSIUM: 3.3 mmol/L — AB (ref 3.5–5.1)
SODIUM: 137 mmol/L (ref 135–145)

## 2015-12-16 LAB — TSH: TSH: 1.116 u[IU]/mL (ref 0.350–4.500)

## 2015-12-16 LAB — I-STAT TROPONIN, ED: TROPONIN I, POC: 0.04 ng/mL (ref 0.00–0.08)

## 2015-12-16 LAB — D-DIMER, QUANTITATIVE: D-Dimer, Quant: 0.45 ug/mL-FEU (ref 0.00–0.50)

## 2015-12-16 MED ORDER — POTASSIUM CHLORIDE CRYS ER 20 MEQ PO TBCR
20.0000 meq | EXTENDED_RELEASE_TABLET | Freq: Once | ORAL | Status: AC
  Administered 2015-12-16: 20 meq via ORAL
  Filled 2015-12-16: qty 1

## 2015-12-16 NOTE — ED Provider Notes (Signed)
CSN: [id number]     Arrival date & time 12/16/15  1823 History   First MD Initiated Contact with Patient 12/16/15 1846     Chief Complaint  Patient presents with  . Chest Pain     (Consider location/radiation/quality/duration/timing/severity/associated sxs/prior Treatment) HPI Comments: Patient is a 70 year old male with a history of syncope, hypertension, COPD, CHF, aortic root dilation, aortic insufficiency and diabetes presenting today with palpitations, shortness of breath and chest pain. Patient states for the last month he has had intermittent symptoms but they have become more severe in the last few days. He states every time he exerts himself he now develops palpitations, shortness of breath and chest tightness. Currently he has no complaints because he states everything improves when he rests. He saw his PCP 2 weeks ago and at that time was told his potassium was low. He has a follow-up appointment on the 27th but states he was feeling so bad he did not think he could wait. He has been taking his medications as prescribed but states he does not eat well. He denies any alcohol or tobacco use.  The history is provided by the patient.    Past Medical History  Diagnosis Date  . Syncope   . Hypertension   . COPD (chronic obstructive pulmonary disease) ([place])   . Hyperlipidemia   . Anxiety   . CHF (congestive heart failure) ([place])   . Childhood asthma   . Aortic root dilatation ([place])     Archie Endo 10/17/2015  . Pneumonia     "2-3 times" (10/17/2015)  . Aortic insufficiency     Archie Endo 10/17/2015  . Sleep apnea     "don't use mask since I lost weight" (10/17/2015)  . Diabetes mellitus type II     diet controlled  . Renal cell carcinoma ([place])     I do not see confirmation of this diagnosis.  He sees a urologist currently.    . Prostate cancer [facility])     "doctor saw a spot on my prostate"    Past Surgical History  Procedure Laterality Date  . Lumbar disc surgery  1974    "replaced a  disc"  . Back surgery     Family History  Problem Relation Age of Onset  . Heart disease Mother     No details.  Still alive age 72  . Emphysema Father   . Asthma Father    Social History  Substance Use Topics  . Smoking status: Former Smoker    Types: Cigars  . Smokeless tobacco: Never Used     Comment: "quit smoking in the 1970s"  . Alcohol Use: Yes     Comment: "stopped drinking alcohol in ~ 1980; just drank a little on the weekends when I did drink"    Review of Systems  All other systems reviewed and are negative.     Allergies  Amoxicillin and Penicillins  Home Medications   Prior to Admission medications   Medication Sig Start Date End Date Taking? Authorizing Provider  albuterol (PROVENTIL HFA;VENTOLIN HFA) 108 (90 Base) MCG/ACT inhaler Inhale 2 puffs into the lungs every 6 (six) hours as needed for wheezing or shortness of breath. 10/18/15  Yes Alexa Angela Burke, MD  albuterol (PROVENTIL) (5 MG/ML) 0.5% nebulizer solution Take 0.5 mLs (2.5 mg total) by nebulization every 6 (six) hours as needed for wheezing or shortness of breath. 10/18/15  Yes Alexa Angela Burke, MD  amLODipine (NORVASC) 10 MG tablet Take 10 mg by mouth  2 (two) times daily.  12/25/11  Yes Historical Provider, MD  KLOR-CON M20 20 MEQ tablet Take 20 mEq by mouth daily. 12/13/15  Yes Historical Provider, MD  tamsulosin (FLOMAX) 0.4 MG CAPS capsule Take 0.8 mg by mouth daily as needed (FOR PROSTATE).    Yes Historical Provider, MD   BP 124/83 mmHg  Pulse 64  Temp(Src) 98.1 F (36.7 C) (Oral)  Resp 20  Ht 5\' 9"  (1.753 m)  Wt 248 lb 14.4 oz (112.9 kg)  BMI 36.74 kg/m2  SpO2 99% Physical Exam  Constitutional: He is oriented to person, place, and time. He appears well-developed and well-nourished. No distress.  HENT:  Head: Normocephalic and atraumatic.  Mouth/Throat: Oropharynx is clear and moist.  Eyes: Conjunctivae and EOM are normal. Pupils are equal, round, and reactive to light.  Prominent proptosis   Neck: Normal range of motion. Neck supple.  Cardiovascular: Normal rate, regular rhythm and intact distal pulses.   No murmur heard. Pulmonary/Chest: Effort normal. No respiratory distress. He has wheezes. He has no rales.  Occasional scant wheeze  Abdominal: Soft. He exhibits no distension. There is no tenderness. There is no rebound and no guarding.  Musculoskeletal: Normal range of motion. He exhibits no edema or tenderness.  Neurological: He is alert and oriented to person, place, and time.  Skin: Skin is warm and dry. No rash noted. No erythema.  Small wound on the left ankle appears to be healing  Psychiatric: He has a normal mood and affect. His behavior is normal.  Nursing note and vitals reviewed.   ED Course  Procedures (including critical care time) Labs Review Labs Reviewed  BASIC METABOLIC PANEL - Abnormal; Notable for the following:    Potassium 3.3 (*)    Glucose, Bld 114 (*)    All other components within normal limits  CBC - Abnormal; Notable for the following:    RBC 4.06 (*)    Hemoglobin 12.1 (*)    HCT 37.0 (*)    All other components within normal limits  Randolm Idol, ED    Imaging Review Dg Chest 2 View  12/16/2015  CLINICAL DATA:  Chronic arrhythmia and generalized chest pain. Initial encounter. EXAM: CHEST  2 VIEW COMPARISON:  Chest radiograph performed 10/17/2015 FINDINGS: The lungs are well-aerated. Vascular congestion is noted. Mildly increased interstitial markings may reflect mild interstitial edema. There is no evidence of pleural effusion or pneumothorax. The heart is borderline enlarged. No acute osseous abnormalities are seen. IMPRESSION: Vascular congestion and borderline cardiomegaly. Mildly increased interstitial markings may reflect mild interstitial edema. Electronically Signed   By: [name] M.D.   On: 12/16/2015 19:37   I have personally reviewed and evaluated these images and lab results as part of my medical  decision-making.   EKG Interpretation   Date/Time:  Saturday December 16 2015 18:30:49 EDT Ventricular Rate:  85 PR Interval:  168 QRS Duration: 98 QT Interval:  362 QTC Calculation: 430 R Axis:   -63 Text Interpretation:  Sinus rhythm with marked sinus arrhythmia with  occasional Premature ventricular complexes Left axis deviation Nonspecific  ST and T wave abnormality Confirmed by Maryan Rued  MD, Loree Fee ([id number]) on  12/16/2015 6:48:28 PM      MDM   Final diagnoses:  Stable angina [facility])    Patient is a 70 year old male with a history of hypertension presenting today with symptoms concerning for ACS. He states over the last month with exertion he continues to get palpitations, chest tightness and shortness of  breath. He saw his doctor several weeks ago and at that time he was told his potassium was low but his follow-up appointment is not the 27th. He states he was feeling so bad today he's decided come to the hospital. At rest patient does not have complaints but with any exertion he states his heart races and he can't catch his breath. No prior history of ACS but he does have a prior history of CHF, aortic insufficiency, diabetes and COPD.  Patient has occasional wheezing on exam he is in no acute distress. No obvious signs of fluid overload on exam.  Concern for ACS today versus COPD versus electrolyte abnormality versus hyperglycemia. Also possible CHF.  CBC, BMP, troponin, BNP, chest x-ray pending. EKG shows occasional PACs and PVCs. Patient does have left axis deviation with ST and T-wave abnormalities that are nonspecific.  CBC within normal limits. BMP with a mild hypokalemia of 3.3 but otherwise no significant findings. Troponin is negative. BNP pending. TSH pending as patient symptoms could be from hyperthyroidism especially given his proptosis  8:15 PM Chest x-ray showing vascular construction and borderline cardiomegaly.  Blanchie Dessert, MD 12/16/15 [phone]

## 2015-12-16 NOTE — ED Notes (Signed)
Pt states his heart hasn't been feeling right for a month. Pt saying his "metabolism is low and my heart goes crazy." Pt states his heart rate is "out of whack" and is having some pains in his chest.

## 2015-12-16 NOTE — ED Notes (Signed)
Patient transported to X-ray 

## 2015-12-17 ENCOUNTER — Encounter (HOSPITAL_COMMUNITY): Payer: Self-pay | Admitting: Cardiology

## 2015-12-17 DIAGNOSIS — E119 Type 2 diabetes mellitus without complications: Secondary | ICD-10-CM | POA: Diagnosis present

## 2015-12-17 DIAGNOSIS — I479 Paroxysmal tachycardia, unspecified: Secondary | ICD-10-CM | POA: Diagnosis not present

## 2015-12-17 DIAGNOSIS — I200 Unstable angina: Secondary | ICD-10-CM | POA: Diagnosis present

## 2015-12-17 DIAGNOSIS — C9000 Multiple myeloma not having achieved remission: Secondary | ICD-10-CM | POA: Diagnosis present

## 2015-12-17 DIAGNOSIS — E876 Hypokalemia: Secondary | ICD-10-CM | POA: Diagnosis present

## 2015-12-17 DIAGNOSIS — I509 Heart failure, unspecified: Secondary | ICD-10-CM | POA: Diagnosis present

## 2015-12-17 DIAGNOSIS — E785 Hyperlipidemia, unspecified: Secondary | ICD-10-CM | POA: Diagnosis present

## 2015-12-17 DIAGNOSIS — R0602 Shortness of breath: Secondary | ICD-10-CM | POA: Diagnosis present

## 2015-12-17 DIAGNOSIS — I209 Angina pectoris, unspecified: Secondary | ICD-10-CM | POA: Diagnosis not present

## 2015-12-17 DIAGNOSIS — I10 Essential (primary) hypertension: Secondary | ICD-10-CM | POA: Diagnosis not present

## 2015-12-17 DIAGNOSIS — R079 Chest pain, unspecified: Secondary | ICD-10-CM | POA: Diagnosis present

## 2015-12-17 DIAGNOSIS — I110 Hypertensive heart disease with heart failure: Secondary | ICD-10-CM | POA: Diagnosis present

## 2015-12-17 DIAGNOSIS — Z8249 Family history of ischemic heart disease and other diseases of the circulatory system: Secondary | ICD-10-CM | POA: Diagnosis not present

## 2015-12-17 DIAGNOSIS — I493 Ventricular premature depolarization: Secondary | ICD-10-CM | POA: Diagnosis present

## 2015-12-17 DIAGNOSIS — I480 Paroxysmal atrial fibrillation: Secondary | ICD-10-CM | POA: Diagnosis present

## 2015-12-17 DIAGNOSIS — Z87891 Personal history of nicotine dependence: Secondary | ICD-10-CM | POA: Diagnosis not present

## 2015-12-17 DIAGNOSIS — I208 Other forms of angina pectoris: Secondary | ICD-10-CM | POA: Diagnosis not present

## 2015-12-17 DIAGNOSIS — I214 Non-ST elevation (NSTEMI) myocardial infarction: Secondary | ICD-10-CM | POA: Diagnosis present

## 2015-12-17 DIAGNOSIS — Z880 Allergy status to penicillin: Secondary | ICD-10-CM | POA: Diagnosis not present

## 2015-12-17 DIAGNOSIS — I77810 Thoracic aortic ectasia: Secondary | ICD-10-CM | POA: Diagnosis present

## 2015-12-17 DIAGNOSIS — J449 Chronic obstructive pulmonary disease, unspecified: Secondary | ICD-10-CM | POA: Diagnosis present

## 2015-12-17 DIAGNOSIS — Z79899 Other long term (current) drug therapy: Secondary | ICD-10-CM | POA: Diagnosis not present

## 2015-12-17 DIAGNOSIS — I25110 Atherosclerotic heart disease of native coronary artery with unstable angina pectoris: Secondary | ICD-10-CM | POA: Diagnosis not present

## 2015-12-17 LAB — BASIC METABOLIC PANEL
Anion gap: 7 (ref 5–15)
BUN: 11 mg/dL (ref 6–20)
CO2: 29 mmol/L (ref 22–32)
Calcium: 8.8 mg/dL — ABNORMAL LOW (ref 8.9–10.3)
Chloride: 103 mmol/L (ref 101–111)
Creatinine, Ser: 0.95 mg/dL (ref 0.61–1.24)
GFR calc Af Amer: >60 mL/min (ref >60)
GLUCOSE: 130 mg/dL — AB (ref 65–99)
POTASSIUM: 3.1 mmol/L — AB (ref 3.5–5.1)
Sodium: 139 mmol/L (ref 135–145)

## 2015-12-17 LAB — CBC WITH DIFFERENTIAL/PLATELET
Basophils Absolute: 0 10*3/uL (ref 0.0–0.1)
Basophils Relative: 0 %
EOS PCT: 5 %
Eosinophils Absolute: 0.4 10*3/uL (ref 0.0–0.7)
HEMATOCRIT: 36.5 % — AB (ref 39.0–52.0)
Hemoglobin: 11.9 g/dL — ABNORMAL LOW (ref 13.0–17.0)
LYMPHS ABS: 2.3 10*3/uL (ref 0.7–4.0)
LYMPHS PCT: 27 %
MCH: 29.8 pg (ref 26.0–34.0)
MCHC: 32.6 g/dL (ref 30.0–36.0)
MCV: 91.5 fL (ref 78.0–100.0)
MONO ABS: 0.7 10*3/uL (ref 0.1–1.0)
Monocytes Relative: 9 %
NEUTROS ABS: 5.2 10*3/uL (ref 1.7–7.7)
Neutrophils Relative %: 59 %
PLATELETS: 282 10*3/uL (ref 150–400)
RBC: 3.99 MIL/uL — ABNORMAL LOW (ref 4.22–5.81)
RDW: 12.9 % (ref 11.5–15.5)
WBC: 8.7 10*3/uL (ref 4.0–10.5)

## 2015-12-17 LAB — MAGNESIUM: Magnesium: 1.8 mg/dL (ref 1.7–2.4)

## 2015-12-17 LAB — HEPARIN LEVEL (UNFRACTIONATED): Heparin Unfractionated: 0.29 IU/mL — ABNORMAL LOW (ref 0.30–0.70)

## 2015-12-17 LAB — URINALYSIS, ROUTINE W REFLEX MICROSCOPIC
Bilirubin Urine: NEGATIVE
GLUCOSE, UA: NEGATIVE mg/dL
Hgb urine dipstick: NEGATIVE
KETONES UR: NEGATIVE mg/dL
Leukocytes, UA: NEGATIVE
NITRITE: NEGATIVE
PH: 7 (ref 5.0–8.0)
Protein, ur: NEGATIVE mg/dL
SPECIFIC GRAVITY, URINE: 1.011 (ref 1.005–1.030)

## 2015-12-17 LAB — GLUCOSE, CAPILLARY
GLUCOSE-CAPILLARY: 117 mg/dL — AB (ref 65–99)
GLUCOSE-CAPILLARY: 143 mg/dL — AB (ref 65–99)
GLUCOSE-CAPILLARY: 194 mg/dL — AB (ref 65–99)
Glucose-Capillary: 93 mg/dL (ref 65–99)
Glucose-Capillary: 90 mg/dL (ref 65–99)

## 2015-12-17 LAB — TROPONIN I
Troponin I: 0.07 ng/mL — ABNORMAL HIGH (ref <0.031)
Troponin I: 0.63 ng/mL (ref <0.031)

## 2015-12-17 LAB — I-STAT TROPONIN, ED: Troponin i, poc: 0.04 ng/mL (ref 0.00–0.08)

## 2015-12-17 MED ORDER — SODIUM CHLORIDE 0.9 % IV SOLN
250.0000 mL | INTRAVENOUS | Status: DC | PRN
  Administered 2015-12-17: 250 mL via INTRAVENOUS

## 2015-12-17 MED ORDER — ENOXAPARIN SODIUM 40 MG/0.4ML ~~LOC~~ SOLN
40.0000 mg | SUBCUTANEOUS | Status: DC
  Administered 2015-12-17: 40 mg via SUBCUTANEOUS
  Filled 2015-12-17: qty 0.4

## 2015-12-17 MED ORDER — ASPIRIN 81 MG PO CHEW
81.0000 mg | CHEWABLE_TABLET | ORAL | Status: AC
  Administered 2015-12-18: 81 mg via ORAL
  Filled 2015-12-17: qty 1

## 2015-12-17 MED ORDER — GI COCKTAIL ~~LOC~~: 30.0000 mL | Freq: Four times a day (QID) | ORAL | Status: DC | PRN

## 2015-12-17 MED ORDER — AMLODIPINE BESYLATE 10 MG PO TABS
10.0000 mg | ORAL_TABLET | Freq: Two times a day (BID) | ORAL | Status: DC
  Administered 2015-12-17 – 2015-12-20 (×7): 10 mg via ORAL
  Filled 2015-12-17 (×8): qty 1

## 2015-12-17 MED ORDER — ONDANSETRON HCL 4 MG/2ML IJ SOLN: 4.0000 mg | Freq: Four times a day (QID) | INTRAMUSCULAR | Status: DC | PRN

## 2015-12-17 MED ORDER — SODIUM CHLORIDE 0.9 % IV SOLN
INTRAVENOUS | Status: DC
  Administered 2015-12-17: 02:00:00 via INTRAVENOUS

## 2015-12-17 MED ORDER — SODIUM CHLORIDE 0.9% FLUSH: 3.0000 mL | INTRAVENOUS | Status: DC | PRN

## 2015-12-17 MED ORDER — ALBUTEROL SULFATE (2.5 MG/3ML) 0.083% IN NEBU: 2.5000 mg | INHALATION_SOLUTION | Freq: Four times a day (QID) | RESPIRATORY_TRACT | Status: DC | PRN

## 2015-12-17 MED ORDER — SODIUM CHLORIDE 0.9 % WEIGHT BASED INFUSION
3.0000 mL/kg/h | INTRAVENOUS | Status: DC
  Administered 2015-12-18: 3 mL/kg/h via INTRAVENOUS

## 2015-12-17 MED ORDER — METOPROLOL TARTRATE 12.5 MG HALF TABLET
12.5000 mg | ORAL_TABLET | Freq: Two times a day (BID) | ORAL | Status: DC
  Administered 2015-12-17: 12.5 mg via ORAL
  Filled 2015-12-17 (×2): qty 1

## 2015-12-17 MED ORDER — MORPHINE SULFATE (PF) 2 MG/ML IV SOLN: 2.0000 mg | INTRAVENOUS | Status: DC | PRN

## 2015-12-17 MED ORDER — TAMSULOSIN HCL 0.4 MG PO CAPS
0.8000 mg | ORAL_CAPSULE | Freq: Every day | ORAL | Status: DC
  Administered 2015-12-17 – 2015-12-20 (×4): 0.8 mg via ORAL
  Filled 2015-12-17 (×5): qty 2

## 2015-12-17 MED ORDER — HEPARIN (PORCINE) IN NACL 100-0.45 UNIT/ML-% IJ SOLN
1450.0000 [IU]/h | INTRAMUSCULAR | Status: DC
  Administered 2015-12-17: 1350 [IU]/h via INTRAVENOUS
  Administered 2015-12-17 – 2015-12-18 (×2): 1450 [IU]/h via INTRAVENOUS
  Filled 2015-12-17 (×3): qty 250

## 2015-12-17 MED ORDER — SODIUM CHLORIDE 0.9% FLUSH: 3.0000 mL | Freq: Two times a day (BID) | INTRAVENOUS | Status: DC

## 2015-12-17 MED ORDER — HEPARIN BOLUS VIA INFUSION
4000.0000 [IU] | Freq: Once | INTRAVENOUS | Status: AC
  Administered 2015-12-17: 4000 [IU] via INTRAVENOUS
  Filled 2015-12-17: qty 4000

## 2015-12-17 MED ORDER — POTASSIUM CHLORIDE CRYS ER 20 MEQ PO TBCR
20.0000 meq | EXTENDED_RELEASE_TABLET | Freq: Every day | ORAL | Status: DC
  Administered 2015-12-17 – 2015-12-20 (×4): 20 meq via ORAL
  Filled 2015-12-17 (×5): qty 1

## 2015-12-17 MED ORDER — ACETAMINOPHEN 325 MG PO TABS
650.0000 mg | ORAL_TABLET | ORAL | Status: DC | PRN
Start: 2015-12-17 — End: 2015-12-20

## 2015-12-17 MED ORDER — POTASSIUM CHLORIDE CRYS ER 20 MEQ PO TBCR
40.0000 meq | EXTENDED_RELEASE_TABLET | Freq: Once | ORAL | Status: AC
  Administered 2015-12-17: 40 meq via ORAL
  Filled 2015-12-17: qty 2

## 2015-12-17 MED ORDER — SODIUM CHLORIDE 0.9 % WEIGHT BASED INFUSION
1.0000 mL/kg/h | INTRAVENOUS | Status: DC
  Administered 2015-12-18: 1 mL/kg/h via INTRAVENOUS

## 2015-12-17 NOTE — Progress Notes (Signed)
Lab Called regarding patient lab Troponin  0.63, Dr. Tana Coast Paged using Amion System. Will continue to monitor patient. Quenten Nawaz, Bettina Gavia RN

## 2015-12-17 NOTE — Progress Notes (Signed)
Tx paged Dr. Tana Coast to inform that the patient's daughter [Sharon] is out of town for holiday. Would like to talk with his Dr. For update r/t his condition. Cell phone = [phone].

## 2015-12-17 NOTE — Progress Notes (Signed)
   12/17/15 0046  Vitals  Temp 97.8 F (36.6 C)  Temp Source Oral  BP (!) 145/93 mmHg  BP Location Left Arm  BP Method Automatic  Patient Position (if appropriate) Sitting  Pulse Rate 64  Pulse Rate Source Dinamap  Resp 18  Oxygen Therapy  SpO2 99 %  O2 Device Room Air  Height and Weight  Height 5\' 9"  (1.753 m)  Weight 110 kg (242 lb 8.1 oz)  Type of Scale Used Standing  BSA (Calculated - sq m) 2.31 sq meters  BMI (Calculated) 35.9  Weight in (lb) to have BMI = 25 168.9    Patient arrived from the ED. Patient oriented to the unit, room and call bell. CCmd called and verified. Pt denies any pain. Pt not too happy about being NPO, but agreeable. Pt. Educated and per patient "I will hold out as long as I can, but I get sick when I don't eat". Will continue to monitor closely.

## 2015-12-17 NOTE — Progress Notes (Addendum)
[facility]                                                                              Patient Demographics  [name] [name], is a 70 y.o. male, DOB - 1946/04/03, [id number]  Admit date - 12/16/2015   Admitting Physician Norval Morton, MD  Outpatient Primary MD for the patient is [name] [name], [place] [facility]:   LOS -   days    Chief Complaint  Patient presents with  . Chest Pain       Brief HPI   70 year old male with hypertension, diabetes type 2,COPD, CHF, aortic root dilation/insuffiency presented with chest tightness and palpitations. Patient reported that he has noticed palpitations, shortness of breath and feeling of chest tightness on exertion and walking, progressively worsening over the last 2-3 months. Patient also reported that his potassium has been low, denies any prior history of atrial fibrillation.  Assessment & Plan    Principal Problem:   Atypical Chest pain with new onset atrial fibrillation paroxysmal: Symptoms could be due to ischemia versus due to A. fib with RVR episodic - Troponins positive, call cardiology consult, appreciate recommendations, recommending 2-D echo and possible cardiac cath in a.m. - NPO after MN - TSH 1.1 - Started on IV heparin drip, BB  Active Problems: Hypokalemia - Replaced  History of aortic root dilatation and aortic insufficiency - 2-D echo ordered to evaluate aorta and LV function  COPD - Currently stable, no wheezing  Type 2 diabetes mellitus - Not on any oral hypoglycemics or insulin at home, check hemoglobin A1c   Code Status: Full code  Family Communication: Discussed in detail with the patient, all imaging results, lab results explained to the patient Addendum called patient's daughter [name] [name] on phone number [phone], she did not pick up the phone  Disposition Plan:   Time Spent in minutes   25 minutes  Procedures  None   Consults   Cardiology  DVT Prophylaxis  heparin drip  Medications  Scheduled Meds: . amLODipine  10 mg Oral BID  . metoprolol tartrate  12.5 mg Oral BID  . potassium chloride SA  20 mEq Oral Daily  . tamsulosin  0.8 mg Oral Daily   Continuous Infusions: . heparin 1,350 Units/hr (12/17/15 1003)   PRN Meds:.acetaminophen, albuterol, gi cocktail, morphine injection, ondansetron (ZOFRAN) IV   Antibiotics   Anti-infectives    None        Subjective:   [name] was seen and examined today. Feeling somewhat better today, normal sinus rhythm on telemetry however patient had palpitations overnight at the time of admission. Reports that at home his heart rate was 190. Patient denies dizziness, chest pain, shortness of breath, abdominal pain, N/V/D/C, new weakness, numbess, tingling.   Objective:   Filed Vitals:   12/16/15 2230 12/17/15 0000 12/17/15 0046 12/17/15 0409  BP: 147/99 139/87 145/93 122/83  Pulse: 120 50 64 68  Temp:   97.8 F (36.6 C) 97.6 F (36.4 C)  TempSrc:   Oral Oral  Resp:  17 18 17   Height:   5\' 9"  (1.753 m)   Weight:   110 kg (  242 lb 8.1 oz)   SpO2: 95% 97% 99% 94%    Intake/Output Summary (Last 24 hours) at 12/17/15 1117 Last data filed at 12/17/15 1000  Gross per 24 hour  Intake      0 ml  Output   1150 ml  Net  -1150 ml     Wt Readings from Last 3 Encounters:  12/17/15 110 kg (242 lb 8.1 oz)  10/17/15 109.317 kg (241 lb)  06/08/15 113.853 kg (251 lb)     Exam  General: Alert and oriented x 3, NAD  HEENT:  PERRLA, EOMI, Anicteric Sclera, mucous membranes moist.   Neck: Supple, no JVD, no masses  CVS: S1 S2 auscultated, no rubs, murmurs or gallops. Regular rate and rhythm.  Respiratory: Clear to auscultation bilaterally, no wheezing, rales or rhonchi  Abdomen: Soft, nontender, nondistended, + bowel sounds  Ext: no cyanosis clubbing or edema  Neuro: AAOx3, Cr N's II- XII. Strength 5/5 upper and lower extremities bilaterally  Skin: No rashes  Psych: Normal affect  and demeanor, alert and oriented x3    Data Reviewed:  I have personally reviewed following labs and imaging studies  Micro Results No results found for this or any previous visit (from the past 240 hour(s)).  Radiology Reports Dg Chest 2 View  12/16/2015  CLINICAL DATA:  Chronic arrhythmia and generalized chest pain. Initial encounter. EXAM: CHEST  2 VIEW COMPARISON:  Chest radiograph performed 10/17/2015 FINDINGS: The lungs are well-aerated. Vascular congestion is noted. Mildly increased interstitial markings may reflect mild interstitial edema. There is no evidence of pleural effusion or pneumothorax. The heart is borderline enlarged. No acute osseous abnormalities are seen. IMPRESSION: Vascular congestion and borderline cardiomegaly. Mildly increased interstitial markings may reflect mild interstitial edema. Electronically Signed   By: [name] M.D.   On: 12/16/2015 19:37    CBC  Recent Labs Lab 12/16/15 1837 12/17/15 0125  WBC 9.5 8.7  HGB 12.1* 11.9*  HCT 37.0* 36.5*  PLT 291 282  MCV 91.1 91.5  MCH 29.8 29.8  MCHC 32.7 32.6  RDW 12.9 12.9  LYMPHSABS  --  2.3  MONOABS  --  0.7  EOSABS  --  0.4  BASOSABS  --  0.0    Chemistries   Recent Labs Lab 12/16/15 1837 12/17/15 0125  NA 137 139  K 3.3* 3.1*  CL 101 103  CO2 26 29  GLUCOSE 114* 130*  BUN 13 11  CREATININE 1.10 0.95  CALCIUM 9.0 8.8*  MG  --  1.8   ------------------------------------------------------------------------------------------------------------------ estimated creatinine clearance is 87.2 mL/min (by C-G formula based on Cr of 0.95). ------------------------------------------------------------------------------------------------------------------ No results for input(s): HGBA1C in the last 72 hours. ------------------------------------------------------------------------------------------------------------------ No results for input(s): CHOL, HDL, LDLCALC, TRIG, CHOLHDL, LDLDIRECT in the  last 72 hours. ------------------------------------------------------------------------------------------------------------------  Recent Labs  12/16/15 2010  TSH 1.116   ------------------------------------------------------------------------------------------------------------------ No results for input(s): VITAMINB12, FOLATE, FERRITIN, TIBC, IRON, RETICCTPCT in the last 72 hours.  Coagulation profile No results for input(s): INR, PROTIME in the last 168 hours.   Recent Labs  12/16/15 2146  DDIMER 0.45    Cardiac Enzymes  Recent Labs Lab 12/17/15 0125 12/17/15 0714  TROPONINI 0.07* 0.63*   ------------------------------------------------------------------------------------------------------------------ Invalid input(s): POCBNP   Recent Labs  12/17/15 0056 12/17/15 0613  GLUCAP 143* 84     Jomarie Gellis M.D. [facility] 12/17/2015, 11:17 AM  Pager: [phone] Between 7am to 7pm - call Pager - [phone]  After 7pm go to [email or web address] - password [facility]  Call  night coverage person covering after 7pm

## 2015-12-17 NOTE — Progress Notes (Signed)
ANTICOAGULATION CONSULT NOTE - Initial Consult  Pharmacy Consult for Heparin Indication: chest pain/ACS   Patient Measurements: Height: 5\' 9"  (175.3 cm) Weight: 242 lb 8.1 oz (110 kg) IBW/kg (Calculated) : 70.7 Heparin Dosing Weight:  95 kg  Vital Signs: Temp: 98.2 F (36.8 C) (04/16 1430) Temp Source: Oral (04/16 1430) BP: 121/71 mmHg (04/16 1430) Pulse Rate: 75 (04/16 1430)  Labs:  Recent Labs  12/16/15 1837 12/17/15 0125 12/17/15 0714 12/17/15 1834  HGB 12.1* 11.9*  --   --   HCT 37.0* 36.5*  --   --   PLT 291 282  --   --   HEPARINUNFRC  --   --   --  0.29*  CREATININE 1.10 0.95  --   --   TROPONINI  --  0.07* 0.63*  --     Estimated Creatinine Clearance: 87.2 mL/min (by C-G formula based on Cr of 0.95).  Assessment:  70 y/o M presents with CP, palpitations, SOB x 1 month. EKG with afib>NSR. Pt has a known h/o syncope, aortic root dilatation and aortic insufficiency, HTN, CHF, syncope. EF 11/15 was 55-60%. CHA2DS2VASc score of 3.   Heparin for afib, HL just below goal this evening on 1350 units/hr. No IV or bleeding issues noted, will make small rate change and recheck level in am.  Goal of Therapy:  Heparin level 0.3-0.7 units/ml Monitor platelets by anticoagulation protocol: Yes   Plan:  Increase Heparin infusion to 1450 units/hr Daily HL and CBC  Erin Hearing PharmD., BCPS Clinical Pharmacist Pager [phone] 12/17/2015 8:15 PM

## 2015-12-17 NOTE — Progress Notes (Signed)
Consent for cardiac cath signed by patient and in chart.

## 2015-12-17 NOTE — Progress Notes (Signed)
Subjective:     Patient ID: [name] [name], male   DOB: Dec 12, 1945, 70 y.o.   MRN: [id number]  HPI patient presents stating I have a lot of heel pain left and I think I stepped on something and it feels like they're something in padded in my heel   Review of Systems     Objective:   Physical Exam Neurovascular status intact muscle strength adequate and range of motion intact long-term. Patient's noted to have a lesion on the plantar aspect left within the heel itself that is irritated and painful when palpated and there may be a foreign body associated with    Assessment:     Possibility for a porokeratotic type lesion versus foreign body versus inflammatory condition    Plan:     H&P and x-rays reviewed with patient. At this time I did deep debridement of lesion and applied padding which seem to take pressure off it. I was not able to find a foreign body but if it's present then we will may have to end up taking this patient to surgery and exploratory  X-ray report indicates that there is no indications of a foreign body currently with no indications of soft tissue abscess

## 2015-12-17 NOTE — H&P (Signed)
[facility] History and Physical  [name] [name] [id number] DOB: 04-17-1946 DOA: 12/16/2015  Referring physician: ED PCP: Vicenta Aly, FNP   Chief Complaint:  "My heart is acting up"  HPI: [name] is a 70 year old male with past medical history significant for HTN, diabetes mellitus type 2, COPD, CHF, aortic root dilation/insuffiency; who presents with complaints of his heart acting up. States whenever he moves or tries to walk anywhere and he develops palpitations, shortness of breath, and feelings of chest tightness. It seemsin talking to the patient that symptoms been progressively worsening over the last 2-3 months. Symptoms somewhat improved with resting, but still feels intermittent palpitations. Previously evaluated at [facility] facility in [place] for which he notes that he was supposed to go back for follow-up, but states that they were taking them too long figure out what was going on and somewhat right where his heart. Associated symptoms of generalized weakness and shortness of breath. Previously told that his potassium was low thinks that that is the reason why his "heart is out of whack". Reports taking all medications as previously advised. Weight has remained stable around 241 pounds.  Denies any alcohol or tobacco abuse. Never reports previously having symptoms like this in the past.  Review of Systems  Constitutional: Positive for malaise/fatigue. Negative for weight loss.  HENT: Positive for hearing loss. Negative for tinnitus.   Eyes: Negative for double vision, photophobia and pain.       Positive for proptosis  Respiratory: Positive for shortness of breath.   Cardiovascular: Positive for chest pain, palpitations and leg swelling.  Gastrointestinal: Negative for diarrhea and blood in stool.  Genitourinary: Negative for urgency and frequency.  Musculoskeletal: Positive for joint pain.  Skin: Negative for itching and rash.  Neurological: Positive for weakness.  Negative for sensory change and speech change.  Endo/Heme/Allergies: Negative for environmental allergies. Does not bruise/bleed easily.  Psychiatric/Behavioral: Negative for hallucinations and substance abuse.    Past Medical History  Diagnosis Date  . Syncope   . Hypertension   . COPD (chronic obstructive pulmonary disease) ([place])   . Hyperlipidemia   . Anxiety   . CHF (congestive heart failure) ([place])   . Childhood asthma   . Aortic root dilatation ([place])     Archie Endo 10/17/2015  . Pneumonia     "2-3 times" (10/17/2015)  . Aortic insufficiency     Archie Endo 10/17/2015  . Sleep apnea     "don't use mask since I lost weight" (10/17/2015)  . Diabetes mellitus type II     diet controlled  . Renal cell carcinoma ([place])     I do not see confirmation of this diagnosis.  He sees a urologist currently.    . Prostate cancer [facility])     "doctor saw a spot on my prostate"      Past Surgical History  Procedure Laterality Date  . Lumbar disc surgery  1974    "replaced a disc"  . Back surgery        Social History:  reports that he has quit smoking. His smoking use included Cigars. He has never used smokeless tobacco. He reports that he drinks alcohol. He reports that he does not use illicit drugs.   Allergies  Allergen Reactions  . Amoxicillin Other (See Comments)    Can't move Has patient had a PCN reaction causing immediate rash, facial/tongue/throat swelling, SOB or lightheadedness with hypotension: No Has patient had a PCN reaction causing severe rash involving mucus membranes or skin necrosis:  No Has patient had a PCN reaction that required hospitalization No Has patient had a PCN reaction occurring within the last 10 years: No If all of the above answers are "NO", then may proceed with Cephalosporin use.   [name] Penicillins Other (See Comments)    Can't move/dizziness Has patient had a PCN reaction causing immediate rash, facial/tongue/throat swelling, SOB or lightheadedness with  hypotension: No Has patient had a PCN reaction causing severe rash involving mucus membranes or skin necrosis: No Has patient had a PCN reaction that required hospitalization No Has patient had a PCN reaction occurring within the last 10 years: No If all of the above answers are "NO", then may proceed with Cephalosporin use.     Family History  Problem Relation Age of Onset  . Heart disease Mother     No details.  Still alive age 60  . Emphysema Father   . Asthma Father        Prior to Admission medications   Medication Sig Start Date End Date Taking? Authorizing Provider  albuterol (PROVENTIL HFA;VENTOLIN HFA) 108 (90 Base) MCG/ACT inhaler Inhale 2 puffs into the lungs every 6 (six) hours as needed for wheezing or shortness of breath. 10/18/15  Yes Alexa Angela Burke, MD  albuterol (PROVENTIL) (5 MG/ML) 0.5% nebulizer solution Take 0.5 mLs (2.5 mg total) by nebulization every 6 (six) hours as needed for wheezing or shortness of breath. 10/18/15  Yes Alexa Angela Burke, MD  amLODipine (NORVASC) 10 MG tablet Take 10 mg by mouth 2 (two) times daily.  12/25/11  Yes Historical Provider, MD  KLOR-CON M20 20 MEQ tablet Take 20 mEq by mouth daily. 12/13/15  Yes Historical Provider, MD  tamsulosin (FLOMAX) 0.4 MG CAPS capsule Take 0.8 mg by mouth daily as needed (FOR PROSTATE).    Yes Historical Provider, MD     Physical Exam: Filed Vitals:   12/16/15 2130 12/16/15 2200 12/16/15 2230 12/17/15 0000  BP: 136/106 155/108 147/99 139/87  Pulse: 56 119 120 50  Temp:      TempSrc:      Resp:    17  Height:      Weight:      SpO2: 94% 98% 95% 97%     Constitutional: Vital signs reviewed. Patient is a well-developed and well-nourished in no acute distress and cooperative with exam. Alert and oriented x3.  Head: Normocephalic and atraumatic  Ear: TM normal bilaterally  Mouth: no erythema or exudates, MMM  Eyes: PERRL, EOMI, conjunctivae normal, No scleral icterus. Proptosis Neck: Supple, Trachea  midline normal ROM, No JVD, mass, thyromegaly, or carotid bruit present.  Cardiovascular: RRR, S1 normal, S2 normal, no MRG, pulses symmetric and intact bilaterally  Pulmonary/Chest: Mild intermittent wheezes appreciated Abdominal: Soft. Non-tender, non-distended, bowel sounds are normal, no masses, organomegaly, or guarding present.  GU: no CVA tenderness Musculoskeletal: No joint deformities, erythema, or stiffness, ROM full and no nontender Ext: +1 pitting edema bilateral lower extremities and no cyanosis, pulses palpable bilaterally (DP and PT)  Hematology: no cervical, inginal, or axillary adenopathy.  Neurological: A&O x3, Strenght is normal and symmetric bilaterally, cranial nerve II-XII are grossly intact, no focal motor deficit, sensory intact to light touch bilaterally.  Skin: Warm, dry and intact. No rash, cyanosis, or clubbing.  Psychiatric: Normal mood and affect. speech and behavior is normal. Judgment and thought content normal. Cognition and memory are normal.      Data Review   Micro Results No results found for this or any previous  visit (from the past 240 hour(s)).  Radiology Reports Dg Chest 2 View  12/16/2015  CLINICAL DATA:  Chronic arrhythmia and generalized chest pain. Initial encounter. EXAM: CHEST  2 VIEW COMPARISON:  Chest radiograph performed 10/17/2015 FINDINGS: The lungs are well-aerated. Vascular congestion is noted. Mildly increased interstitial markings may reflect mild interstitial edema. There is no evidence of pleural effusion or pneumothorax. The heart is borderline enlarged. No acute osseous abnormalities are seen. IMPRESSION: Vascular congestion and borderline cardiomegaly. Mildly increased interstitial markings may reflect mild interstitial edema. Electronically Signed   By: [name] M.D.   On: 12/16/2015 19:37     CBC  Recent Labs Lab 12/16/15 1837  WBC 9.5  HGB 12.1*  HCT 37.0*  PLT 291  MCV 91.1  MCH 29.8  MCHC 32.7  RDW 12.9     Chemistries   Recent Labs Lab 12/16/15 1837  NA 137  K 3.3*  CL 101  CO2 26  GLUCOSE 114*  BUN 13  CREATININE 1.10  CALCIUM 9.0   ------------------------------------------------------------------------------------------------------------------ estimated creatinine clearance is 76.3 mL/min (by C-G formula based on Cr of 1.1). ------------------------------------------------------------------------------------------------------------------ No results for input(s): HGBA1C in the last 72 hours. ------------------------------------------------------------------------------------------------------------------ No results for input(s): CHOL, HDL, LDLCALC, TRIG, CHOLHDL, LDLDIRECT in the last 72 hours. ------------------------------------------------------------------------------------------------------------------  Recent Labs  12/16/15 2010  TSH 1.116   ------------------------------------------------------------------------------------------------------------------ No results for input(s): VITAMINB12, FOLATE, FERRITIN, TIBC, IRON, RETICCTPCT in the last 72 hours.  Coagulation profile No results for input(s): INR, PROTIME in the last 168 hours.   Recent Labs  12/16/15 2146  DDIMER 0.45    Cardiac Enzymes No results for input(s): CKMB, TROPONINI, MYOGLOBIN in the last 168 hours.  Invalid input(s): CK ------------------------------------------------------------------------------------------------------------------ Invalid input(s): POCBNP   CBG: No results for input(s): GLUCAP in the last 168 hours.     EKG: Independently reviewed. A. fib with RVR rate of 109 with left axis deviation   Assessment/Plan Chest pain: Suspect Stable angina as patient presents with complaints of chest pain with exertion. - Admit to a telemetry bed -Trend cardiac enzymes - Check echocardiogram - Repeat echo in a.m. - Consult cardiology in a.m.     Arrhythmia. Patient was seen  to be in atrial fibrillation, but then has moments of which he seen to be significantly bradycardic. Patient with known history of aortic root dilation and aortic insufficiency. - Follow-up telemetry   Hypokalemia: Potassium 3.3 on admission. Patient was to receive 20 mEq of potassium in the ED.  - Continue home 20 mEq potassium daily   - Continue to monitor and replace as needed      Lovenox for DVT prophylaxis  Code Status:   full Family Communication: bedside Disposition Plan: admit   Total time spent 55 minutes.Greater than 50% of this time was spent in counseling, explanation of diagnosis, planning of further management, and coordination of care  [facility] Pager [phone]  If 7PM-7AM, please contact night-coverage [email or web address] Password [facility] 12/17/2015, 12:18 AM

## 2015-12-17 NOTE — Progress Notes (Signed)
ANTICOAGULATION CONSULT NOTE - Initial Consult  Pharmacy Consult for Heparin Indication: chest pain/ACS  Allergies  Allergen Reactions  . Amoxicillin Other (See Comments)    Can't move Has patient had a PCN reaction causing immediate rash, facial/tongue/throat swelling, SOB or lightheadedness with hypotension: No Has patient had a PCN reaction causing severe rash involving mucus membranes or skin necrosis: No Has patient had a PCN reaction that required hospitalization No Has patient had a PCN reaction occurring within the last 10 years: No If all of the above answers are "NO", then may proceed with Cephalosporin use.   [name] Penicillins Other (See Comments)    Can't move/dizziness Has patient had a PCN reaction causing immediate rash, facial/tongue/throat swelling, SOB or lightheadedness with hypotension: No Has patient had a PCN reaction causing severe rash involving mucus membranes or skin necrosis: No Has patient had a PCN reaction that required hospitalization No Has patient had a PCN reaction occurring within the last 10 years: No If all of the above answers are "NO", then may proceed with Cephalosporin use.     Patient Measurements: Height: _0  (175.3 cm) Weight: 242 lb 8.1 oz (110 kg) IBW/kg (Calculated) : 70.7 Heparin Dosing Weight:  95 kg  Vital Signs: Temp: 97.6 F (36.4 C) (04/16 0409) Temp Source: Oral (04/16 0409) BP: 122/83 mmHg (04/16 0409) Pulse Rate: 68 (04/16 0409)  Labs:  Recent Labs  12/16/15 1837 12/17/15 0125 12/17/15 0714  HGB 12.1* 11.9*  --   HCT 37.0* 36.5*  --   PLT 291 282  --   CREATININE 1.10 0.95  --   TROPONINI  --  0.07* 0.63*    Estimated Creatinine Clearance: 87.2 mL/min (by C-G formula based on Cr of 0.95).   Medical History: Past Medical History  Diagnosis Date  . Syncope   . Hypertension   . COPD (chronic obstructive pulmonary disease) ([place])   . Hyperlipidemia   . Anxiety   . CHF (congestive heart failure) ([place])   .  Childhood asthma   . Aortic root dilatation ([place])     Archie Endo 10/17/2015  . Pneumonia     "2-3 times" (10/17/2015)  . Aortic insufficiency     Archie Endo 10/17/2015  . Sleep apnea     "don't use mask since I lost weight" (10/17/2015)  . Diabetes mellitus type II     diet controlled  . Renal cell carcinoma ([place])     I do not see confirmation of this diagnosis.  He sees a urologist currently.    . Prostate cancer [facility])     "doctor saw a spot on my prostate"     Medications:  Prescriptions prior to admission  Medication Sig Dispense Refill Last Dose  . albuterol (PROVENTIL HFA;VENTOLIN HFA) 108 (90 Base) MCG/ACT inhaler Inhale 2 puffs into the lungs every 6 (six) hours as needed for wheezing or shortness of breath. 1 Inhaler 3 Past Week at Unknown time  . albuterol (PROVENTIL) (5 MG/ML) 0.5% nebulizer solution Take 0.5 mLs (2.5 mg total) by nebulization every 6 (six) hours as needed for wheezing or shortness of breath. 20 mL 12 Past Week at Unknown time  . amLODipine (NORVASC) 10 MG tablet Take 10 mg by mouth 2 (two) times daily.    12/16/2015 at AM  . KLOR-CON M20 20 MEQ tablet Take 20 mEq by mouth daily.   12/16/2015 at Unknown time  . tamsulosin (FLOMAX) 0.4 MG CAPS capsule Take 0.8 mg by mouth daily as needed (FOR PROSTATE).  Past Month at Unknown time    Assessment: heart is acting up.  70 y/o M presents with CP, palpitations, SOB x 1 month. EKG with afib>NSR. Pt has a known h/o syncope, aortic root dilatation and aortic insufficiency, HTN, CHF, syncope. EF 11/15 was 55-60%. CHA2DS2VASc score of 3. CXR vascular congestion, interstitial edema. Also being treated for multiple myeloma.  BNP 46 K=3.1.  Mg 1.8.  Troponins 0.04, 0.07, 0.63 Hgb 11.9 slightly low. Ddimer 0.45 WNL  Anticoagulation: Start IV heparin for afib but also for elevated troponins and r/o ischemia.  Goal of Therapy:  Heparin level 0.3-0.7 units/ml Monitor platelets by anticoagulation protocol: Yes   Plan:   Heparin 4000 unit IV bolus then Heparin infusion at 1350 units/hr Check heparin level 6-8 hrs after heparin starts Daily HL and CBC   [name] [name], PharmD, BCPS Clinical Staff Pharmacist Pager [phone]  [name] [name] 12/17/2015,9:25 AM

## 2015-12-17 NOTE — Consult Note (Signed)
Reason for Consult: palpitations, DOE, chest tightness   Referring Physician: Dr. Fuller Plan    PCP:  Vicenta Aly, FNP  Primary Cardiologist:Dr. Vita Barley   [name] [name] is an 70 y.o. male.    Chief Complaint:  Admitted early AM after presentations with DOE, chest tightness and palpitations.   HPI: Asked to see 75 yr.old pt with a history of aortic root dilatation and aortic insufficiency.. He had a negative stress perfusion study in 2012 a mildly reduced ejection fraction. However, followup echo demonstrated normal left ventricular function with mild AI.also hx of cardiomyopathy and HTN.  Pt was to have MRI of chest and card morphology ordered last Oct but has not been done.  Last echo 2015 with EF 55-60%, G1DD, mild AI, mild MR, LA was moderately dilated and RA severely dilated, mild TR, PA pk pressure 28.  Ascending aorta measuring up to 4.8 cm. No hx of cardiac cath.  CHA2DS2VASc score of 3, HTN age, diabetes.  Pt also with IgG kappa smoldering multiple myeloma followed at [facility] in [place]. Last seen 12/12/15. Has BPH as well.    Now presents with increasing DOE, weakness with exertion, rapid heart rate at times, and chest pressure.  Freq the chest pressure comes with rapid HR.   He also tells me he passed out this week while sitting down.  Is not sure how long he was out.    EKG 12/17/15 0617 a fib with non specific T wave changes EKG  12/16/15 1830  SR with brief runs PAF EKG 12/16/15 1829  A fib with conversion to SR last beat, non specific T wave changes.   BNP 46   Troponin o.o4>>0.07>>0.63 K+ 3.1, Mg+ 1.8, Cr 0.95 H/H 11.9/36.5 CXR vascular congestion, interstitial edema   Past Medical History  Diagnosis Date  . Syncope   . Hypertension   . COPD (chronic obstructive pulmonary disease) ([place])   . Hyperlipidemia   . Anxiety   . CHF (congestive heart failure) ([place])   . Childhood asthma   . Aortic root dilatation ([place])     Archie Endo 10/17/2015  .  Pneumonia     "2-3 times" (10/17/2015)  . Aortic insufficiency     Archie Endo 10/17/2015  . Sleep apnea     "don't use mask since I lost weight" (10/17/2015)  . Diabetes mellitus type II     diet controlled  . Renal cell carcinoma ([place])     I do not see confirmation of this diagnosis.  He sees a urologist currently.    . Prostate cancer [facility])     "doctor saw a spot on my prostate"     Past Surgical History  Procedure Laterality Date  . Lumbar disc surgery  1974    "replaced a disc"  . Back surgery      Family History  Problem Relation Age of Onset  . Heart disease Mother     No details.  Still alive age 83  . Emphysema Father   . Asthma Father    Social History:  reports that he has quit smoking. His smoking use included Cigars. He has never used smokeless tobacco. He reports that he drinks alcohol. He reports that he does not use illicit drugs.  Allergies:  Allergies  Allergen Reactions  . Amoxicillin Other (See Comments)    Can't move Has patient had a PCN reaction causing immediate rash, facial/tongue/throat swelling, SOB or lightheadedness with hypotension: No Has patient had  a PCN reaction causing severe rash involving mucus membranes or skin necrosis: No Has patient had a PCN reaction that required hospitalization No Has patient had a PCN reaction occurring within the last 10 years: No If all of the above answers are "NO", then may proceed with Cephalosporin use.   [name] Penicillins Other (See Comments)    Can't move/dizziness Has patient had a PCN reaction causing immediate rash, facial/tongue/throat swelling, SOB or lightheadedness with hypotension: No Has patient had a PCN reaction causing severe rash involving mucus membranes or skin necrosis: No Has patient had a PCN reaction that required hospitalization No Has patient had a PCN reaction occurring within the last 10 years: No If all of the above answers are "NO", then may proceed with Cephalosporin use.      OUTPATIENT MEDICATIONS:  No current facility-administered medications on file prior to encounter.   Current Outpatient Prescriptions on File Prior to Encounter  Medication Sig Dispense Refill  . albuterol (PROVENTIL HFA;VENTOLIN HFA) 108 (90 Base) MCG/ACT inhaler Inhale 2 puffs into the lungs every 6 (six) hours as needed for wheezing or shortness of breath. 1 Inhaler 3  . albuterol (PROVENTIL) (5 MG/ML) 0.5% nebulizer solution Take 0.5 mLs (2.5 mg total) by nebulization every 6 (six) hours as needed for wheezing or shortness of breath. 20 mL 12  . amLODipine (NORVASC) 10 MG tablet Take 10 mg by mouth 2 (two) times daily.     . tamsulosin (FLOMAX) 0.4 MG CAPS capsule Take 0.8 mg by mouth daily as needed (FOR PROSTATE).      CURRENT MEDICATIONS: Scheduled Meds: . amLODipine  10 mg Oral BID  . potassium chloride SA  20 mEq Oral Daily  . potassium chloride  40 mEq Oral Once  . tamsulosin  0.8 mg Oral Daily   Continuous Infusions:  PRN Meds:.acetaminophen, albuterol, gi cocktail, morphine injection, ondansetron (ZOFRAN) IV   Results for orders placed or performed during the hospital encounter of 12/16/15 (from the past 48 hour(s))  Basic metabolic panel     Status: Abnormal   Collection Time: 12/16/15  6:37 PM  Result Value Ref Range   Sodium 137 135 - 145 mmol/L   Potassium 3.3 (L) 3.5 - 5.1 mmol/L   Chloride 101 101 - 111 mmol/L   CO2 26 22 - 32 mmol/L   Glucose, Bld 114 (H) 65 - 99 mg/dL   BUN 13 6 - 20 mg/dL   Creatinine, Ser 1.10 0.61 - 1.24 mg/dL   Calcium 9.0 8.9 - 10.3 mg/dL   GFR calc non Af Amer >60 >60 mL/min   GFR calc Af Amer >60 >60 mL/min    Comment: (NOTE) The eGFR has been calculated using the CKD EPI equation. This calculation has not been validated in all clinical situations. eGFR's persistently <60 mL/min signify possible Chronic Kidney Disease.    Anion gap 10 5 - 15  CBC     Status: Abnormal   Collection Time: 12/16/15  6:37 PM  Result Value Ref  Range   WBC 9.5 4.0 - 10.5 K/uL   RBC 4.06 (L) 4.22 - 5.81 MIL/uL   Hemoglobin 12.1 (L) 13.0 - 17.0 g/dL   HCT 37.0 (L) 39.0 - 52.0 %   MCV 91.1 78.0 - 100.0 fL   MCH 29.8 26.0 - 34.0 pg   MCHC 32.7 30.0 - 36.0 g/dL   RDW 12.9 11.5 - 15.5 %   Platelets 291 150 - 400 K/uL  I-stat troponin, ED  Status: None   Collection Time: 12/16/15  6:42 PM  Result Value Ref Range   Troponin i, poc 0.04 0.00 - 0.08 ng/mL   Comment 3            Comment: Due to the release kinetics of cTnI, a negative result within the first hours of the onset of symptoms does not rule out myocardial infarction with certainty. If myocardial infarction is still suspected, repeat the test at appropriate intervals.   Brain natriuretic peptide     Status: None   Collection Time: 12/16/15  8:10 PM  Result Value Ref Range   B Natriuretic Peptide 46.1 0.0 - 100.0 pg/mL  TSH     Status: None   Collection Time: 12/16/15  8:10 PM  Result Value Ref Range   TSH 1.116 0.350 - 4.500 uIU/mL  D-dimer, quantitative (not at [facility])     Status: None   Collection Time: 12/16/15  9:46 PM  Result Value Ref Range   D-Dimer, Quant 0.45 0.00 - 0.50 ug/mL-FEU    Comment: (NOTE) At the manufacturer cut-off of 0.50 ug/mL FEU, this assay has been documented to exclude PE with a sensitivity and negative predictive value of 97 to 99%.  At this time, this assay has not been approved by the FDA to exclude DVT/VTE. Results should be correlated with clinical presentation.   I-stat troponin, ED     Status: None   Collection Time: 12/16/15 11:53 PM  Result Value Ref Range   Troponin i, poc 0.04 0.00 - 0.08 ng/mL   Comment 3            Comment: Due to the release kinetics of cTnI, a negative result within the first hours of the onset of symptoms does not rule out myocardial infarction with certainty. If myocardial infarction is still suspected, repeat the test at appropriate intervals.   Glucose, capillary     Status: Abnormal    Collection Time: 12/17/15 12:56 AM  Result Value Ref Range   Glucose-Capillary 143 (H) 65 - 99 mg/dL   Comment 1 Notify RN    Comment 2 Document in Chart   Troponin I-serum (0, 3, 6 hours)     Status: Abnormal   Collection Time: 12/17/15  1:25 AM  Result Value Ref Range   Troponin I 0.07 (H) <0.031 ng/mL    Comment:        PERSISTENTLY INCREASED TROPONIN VALUES IN THE RANGE OF 0.04-0.49 ng/mL CAN BE SEEN IN:       -UNSTABLE ANGINA       -CONGESTIVE HEART FAILURE       -MYOCARDITIS       -CHEST TRAUMA       -ARRYHTHMIAS       -LATE PRESENTING MYOCARDIAL INFARCTION       -COPD   CLINICAL FOLLOW-UP RECOMMENDED.   CBC with Differential/Platelet     Status: Abnormal   Collection Time: 12/17/15  1:25 AM  Result Value Ref Range   WBC 8.7 4.0 - 10.5 K/uL   RBC 3.99 (L) 4.22 - 5.81 MIL/uL   Hemoglobin 11.9 (L) 13.0 - 17.0 g/dL   HCT 36.5 (L) 39.0 - 52.0 %   MCV 91.5 78.0 - 100.0 fL   MCH 29.8 26.0 - 34.0 pg   MCHC 32.6 30.0 - 36.0 g/dL   RDW 12.9 11.5 - 15.5 %   Platelets 282 150 - 400 K/uL   Neutrophils Relative % 59 %   Neutro Abs 5.2 1.7 -  7.7 K/uL   Lymphocytes Relative 27 %   Lymphs Abs 2.3 0.7 - 4.0 K/uL   Monocytes Relative 9 %   Monocytes Absolute 0.7 0.1 - 1.0 K/uL   Eosinophils Relative 5 %   Eosinophils Absolute 0.4 0.0 - 0.7 K/uL   Basophils Relative 0 %   Basophils Absolute 0.0 0.0 - 0.1 K/uL  Basic metabolic panel     Status: Abnormal   Collection Time: 12/17/15  1:25 AM  Result Value Ref Range   Sodium 139 135 - 145 mmol/L   Potassium 3.1 (L) 3.5 - 5.1 mmol/L   Chloride 103 101 - 111 mmol/L   CO2 29 22 - 32 mmol/L   Glucose, Bld 130 (H) 65 - 99 mg/dL   BUN 11 6 - 20 mg/dL   Creatinine, Ser 0.95 0.61 - 1.24 mg/dL   Calcium 8.8 (L) 8.9 - 10.3 mg/dL   GFR calc non Af Amer >60 >60 mL/min   GFR calc Af Amer >60 >60 mL/min    Comment: (NOTE) The eGFR has been calculated using the CKD EPI equation. This calculation has not been validated in all clinical  situations. eGFR's persistently <60 mL/min signify possible Chronic Kidney Disease.    Anion gap 7 5 - 15  Magnesium     Status: None   Collection Time: 12/17/15  1:25 AM  Result Value Ref Range   Magnesium 1.8 1.7 - 2.4 mg/dL  Urinalysis, Routine w reflex microscopic (not at [facility])     Status: None   Collection Time: 12/17/15  2:58 AM  Result Value Ref Range   Color, Urine YELLOW YELLOW   APPearance CLEAR CLEAR   Specific Gravity, Urine 1.011 1.005 - 1.030   pH 7.0 5.0 - 8.0   Glucose, UA NEGATIVE NEGATIVE mg/dL   Hgb urine dipstick NEGATIVE NEGATIVE   Bilirubin Urine NEGATIVE NEGATIVE   Ketones, ur NEGATIVE NEGATIVE mg/dL   Protein, ur NEGATIVE NEGATIVE mg/dL   Nitrite NEGATIVE NEGATIVE   Leukocytes, UA NEGATIVE NEGATIVE    Comment: MICROSCOPIC NOT DONE ON URINES WITH NEGATIVE PROTEIN, BLOOD, LEUKOCYTES, NITRITE, OR GLUCOSE <1000 mg/dL.  Glucose, capillary     Status: None   Collection Time: 12/17/15  6:13 AM  Result Value Ref Range   Glucose-Capillary 93 65 - 99 mg/dL  Troponin I-serum (0, 3, 6 hours)     Status: Abnormal   Collection Time: 12/17/15  7:14 AM  Result Value Ref Range   Troponin I 0.63 (HH) <0.031 ng/mL    Comment:        POSSIBLE MYOCARDIAL ISCHEMIA. SERIAL TESTING RECOMMENDED. CRITICAL RESULT CALLED TO, READ BACK BY AND VERIFIED WITH: Sammuel Hines [id number] [facility]    Dg Chest 2 View  12/16/2015  CLINICAL DATA:  Chronic arrhythmia and generalized chest pain. Initial encounter. EXAM: CHEST  2 VIEW COMPARISON:  Chest radiograph performed 10/17/2015 FINDINGS: The lungs are well-aerated. Vascular congestion is noted. Mildly increased interstitial markings may reflect mild interstitial edema. There is no evidence of pleural effusion or pneumothorax. The heart is borderline enlarged. No acute osseous abnormalities are seen. IMPRESSION: Vascular congestion and borderline cardiomegaly. Mildly increased interstitial markings may reflect mild interstitial edema.  Electronically Signed   By: [name] M.D.   On: 12/16/2015 19:37    ROS: General:no colds or fevers, some weight loss, he used to weigh over 300 lbs and exercised and changed diet with wt loss, stopping sleep apnea Skin:no rashes or ulcers HEENT:no blurred vision, no congestion CV:see HPI  PUL:see HPI GI:no diarrhea constipation or melena, no indigestion--no hx of GI bleeding GU:no hematuria, no dysuria MS:no joint pain, no claudication Neuro:+ syncope, no lightheadedness Endo:+ diabetes, no thyroid disease   Blood pressure 122/83, pulse 68, temperature 97.6 F (36.4 C), temperature source Oral, resp. rate 17, height _0  (1.753 m), weight 242 lb 8.1 oz (110 kg), SpO2 94 %.  Wt Readings from Last 3 Encounters:  12/17/15 242 lb 8.1 oz (110 kg)  10/17/15 241 lb (109.317 kg)  06/08/15 251 lb (113.853 kg)    PE: General:Pleasant affect, NAD Skin:Warm and dry, brisk capillary refill HEENT:normocephalic, sclera clear, mucus membranes moist Neck:supple, + JVD, no bruits  Heart:S1S2 RRR without murmur, gallup, rub or click- irreg irreg at times Lungs:clear to diminished without rales, rhonchi, or wheezes GBT:DVVO, non tender, + BS, do not palpate liver spleen or masses Ext:tr lower ext edema, 1+ pedal pulses, 2+ radial pulses Neuro:alert and oriented X 3, MAE, follows commands, + facial symmetry    Assessment/Plan Principal Problem:   Chest pain Active Problems:   Stable angina (HCC)   Atrial fibrillation (HCC)   Multiple myeloma ([place])  Chest pressure/tightness with elevated troponin, associated with rapid HR.  Is weak with exertion troponins elevated.  This may be due to PAF but may be precipitated by ischemia.  TSH is normal. ? Cardiac cath - Dr. Harrington Challenger to see. Echo has been ordered.  Pt currently NPO- and IV fluids at 75- will decrease fluids.   PAF with rates to 120-130 then SR to SB rates to 50.  Add IV heparin for now, CHA2DS2VASC is at least 3.  And add low dose BB will  have to monitor HR closely.   Hypokalemia- rec'd 40 meq kdur this AM.     Cecilie Kicks R  Nurse Practitioner Certified [place] Pager [phone] or after 5pm or weekends call [phone] 12/17/2015, 9:02 AM  Patient seen and examined  I agree with findings as noted above by L INgold Pt is a difficult historian   Pt with a nistory of HTN and aortic dilation with AI   Pt has noted increased fatigue recently and also chest pressure /tightness with activity   Home heart monitor has alos shown that his HR has been elevated .   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  Plan for AM  Dorris Carnes

## 2015-12-18 ENCOUNTER — Encounter (HOSPITAL_COMMUNITY)
Admission: EM | Disposition: A | Discharge status: Home / Self Care | Payer: Self-pay | Source: Home / Self Care | Attending: Internal Medicine

## 2015-12-18 ENCOUNTER — Inpatient Hospital Stay (HOSPITAL_COMMUNITY): Payer: Medicare Other

## 2015-12-18 ENCOUNTER — Encounter (HOSPITAL_COMMUNITY): Payer: Self-pay | Admitting: Student

## 2015-12-18 DIAGNOSIS — I25110 Atherosclerotic heart disease of native coronary artery with unstable angina pectoris: Secondary | ICD-10-CM

## 2015-12-18 DIAGNOSIS — I200 Unstable angina: Principal | ICD-10-CM

## 2015-12-18 DIAGNOSIS — I10 Essential (primary) hypertension: Secondary | ICD-10-CM

## 2015-12-18 DIAGNOSIS — E785 Hyperlipidemia, unspecified: Secondary | ICD-10-CM

## 2015-12-18 DIAGNOSIS — R079 Chest pain, unspecified: Secondary | ICD-10-CM

## 2015-12-18 HISTORY — PX: CARDIAC CATHETERIZATION: SHX172

## 2015-12-18 LAB — BASIC METABOLIC PANEL
ANION GAP: 9 (ref 5–15)
BUN: 7 mg/dL (ref 6–20)
CO2: 28 mmol/L (ref 22–32)
Calcium: 8.7 mg/dL — ABNORMAL LOW (ref 8.9–10.3)
Chloride: 104 mmol/L (ref 101–111)
Creatinine, Ser: 0.93 mg/dL (ref 0.61–1.24)
GFR calc Af Amer: >60 mL/min (ref >60)
GLUCOSE: 101 mg/dL — AB (ref 65–99)
POTASSIUM: 3.4 mmol/L — AB (ref 3.5–5.1)
Sodium: 141 mmol/L (ref 135–145)

## 2015-12-18 LAB — CBC
HEMATOCRIT: 37.8 % — AB (ref 39.0–52.0)
HEMOGLOBIN: 12.2 g/dL — AB (ref 13.0–17.0)
MCH: 29.5 pg (ref 26.0–34.0)
MCHC: 32.3 g/dL (ref 30.0–36.0)
MCV: 91.5 fL (ref 78.0–100.0)
Platelets: 278 10*3/uL (ref 150–400)
RBC: 4.13 MIL/uL — ABNORMAL LOW (ref 4.22–5.81)
RDW: 13 % (ref 11.5–15.5)
WBC: 8.4 10*3/uL (ref 4.0–10.5)

## 2015-12-18 LAB — HEPARIN LEVEL (UNFRACTIONATED): Heparin Unfractionated: 0.37 IU/mL (ref 0.30–0.70)

## 2015-12-18 LAB — SURGICAL PCR SCREEN
MRSA, PCR: NEGATIVE
STAPHYLOCOCCUS AUREUS: NEGATIVE

## 2015-12-18 LAB — POCT ACTIVATED CLOTTING TIME: ACTIVATED CLOTTING TIME: 167 s

## 2015-12-18 LAB — HEMOGLOBIN A1C
HEMOGLOBIN A1C: 5.5 % (ref 4.8–5.6)
Mean Plasma Glucose: 111 mg/dL

## 2015-12-18 LAB — ECHOCARDIOGRAM COMPLETE
Height: 69 in
WEIGHTICAEL: 3830.71 [oz_av]

## 2015-12-18 LAB — GLUCOSE, CAPILLARY: GLUCOSE-CAPILLARY: 98 mg/dL (ref 65–99)

## 2015-12-18 LAB — PROTIME-INR
INR: 1.04 (ref 0.00–1.49)
PROTHROMBIN TIME: 13.8 s (ref 11.6–15.2)

## 2015-12-18 SURGERY — LEFT HEART CATH AND CORONARY ANGIOGRAPHY
Anesthesia: LOCAL

## 2015-12-18 MED ORDER — VERAPAMIL HCL 2.5 MG/ML IV SOLN
INTRAVENOUS | Status: AC
  Filled 2015-12-18: qty 2

## 2015-12-18 MED ORDER — HEPARIN SODIUM (PORCINE) 1000 UNIT/ML IJ SOLN
INTRAMUSCULAR | Status: DC | PRN
  Administered 2015-12-18: 5000 [IU] via INTRAVENOUS

## 2015-12-18 MED ORDER — SODIUM CHLORIDE 0.9 % WEIGHT BASED INFUSION
3.0000 mL/kg/h | INTRAVENOUS | Status: DC
  Administered 2015-12-19: 3 mL/kg/h via INTRAVENOUS

## 2015-12-18 MED ORDER — SODIUM CHLORIDE 0.9 % IV SOLN: 250.0000 mL | INTRAVENOUS | Status: DC | PRN

## 2015-12-18 MED ORDER — SODIUM CHLORIDE 0.9 % WEIGHT BASED INFUSION
1.0000 mL/kg/h | INTRAVENOUS | Status: DC
  Administered 2015-12-18: 1 mL/kg/h via INTRAVENOUS

## 2015-12-18 MED ORDER — ASPIRIN 81 MG PO CHEW
81.0000 mg | CHEWABLE_TABLET | ORAL | Status: AC
  Administered 2015-12-19: 81 mg via ORAL
  Filled 2015-12-18: qty 1

## 2015-12-18 MED ORDER — MIDAZOLAM HCL 2 MG/2ML IJ SOLN
INTRAMUSCULAR | Status: AC
  Filled 2015-12-18: qty 2

## 2015-12-18 MED ORDER — HEPARIN SODIUM (PORCINE) 1000 UNIT/ML IJ SOLN
INTRAMUSCULAR | Status: AC
  Filled 2015-12-18: qty 1

## 2015-12-18 MED ORDER — HEPARIN (PORCINE) IN NACL 100-0.45 UNIT/ML-% IJ SOLN
1450.0000 [IU]/h | INTRAMUSCULAR | Status: DC
  Administered 2015-12-18 – 2015-12-19 (×2): 1450 [IU]/h via INTRAVENOUS
  Filled 2015-12-18: qty 250

## 2015-12-18 MED ORDER — HEPARIN (PORCINE) IN NACL 2-0.9 UNIT/ML-% IJ SOLN
INTRAMUSCULAR | Status: AC
  Filled 2015-12-18: qty 500

## 2015-12-18 MED ORDER — SODIUM CHLORIDE 0.9% FLUSH: 3.0000 mL | Freq: Two times a day (BID) | INTRAVENOUS | Status: DC

## 2015-12-18 MED ORDER — SODIUM CHLORIDE 0.9% FLUSH
3.0000 mL | Freq: Two times a day (BID) | INTRAVENOUS | Status: DC
  Administered 2015-12-18: 3 mL via INTRAVENOUS

## 2015-12-18 MED ORDER — IOPAMIDOL (ISOVUE-370) INJECTION 76%
INTRAVENOUS | Status: DC | PRN
  Administered 2015-12-18: 220 mL via INTRA_ARTERIAL

## 2015-12-18 MED ORDER — MIDAZOLAM HCL 2 MG/2ML IJ SOLN
INTRAMUSCULAR | Status: DC | PRN
Start: 2015-12-18 — End: 2015-12-18
  Administered 2015-12-18: 2 mg via INTRAVENOUS

## 2015-12-18 MED ORDER — IOPAMIDOL (ISOVUE-370) INJECTION 76%
INTRAVENOUS | Status: AC
  Filled 2015-12-18: qty 50

## 2015-12-18 MED ORDER — ASPIRIN EC 81 MG PO TBEC: 81.0000 mg | DELAYED_RELEASE_TABLET | Freq: Every day | ORAL | Status: DC

## 2015-12-18 MED ORDER — POTASSIUM CHLORIDE CRYS ER 20 MEQ PO TBCR
40.0000 meq | EXTENDED_RELEASE_TABLET | Freq: Once | ORAL | Status: AC
  Administered 2015-12-18: 40 meq via ORAL
  Filled 2015-12-18: qty 2

## 2015-12-18 MED ORDER — SODIUM CHLORIDE 0.9% FLUSH: 3.0000 mL | INTRAVENOUS | Status: DC | PRN

## 2015-12-18 MED ORDER — IOPAMIDOL (ISOVUE-370) INJECTION 76%
INTRAVENOUS | Status: AC
  Filled 2015-12-18: qty 100

## 2015-12-18 MED ORDER — LIDOCAINE HCL (PF) 1 % IJ SOLN
INTRAMUSCULAR | Status: AC
  Filled 2015-12-18: qty 30

## 2015-12-18 MED ORDER — HEPARIN (PORCINE) IN NACL 2-0.9 UNIT/ML-% IJ SOLN
INTRAMUSCULAR | Status: DC | PRN
  Administered 2015-12-18: 10 mL via INTRA_ARTERIAL

## 2015-12-18 MED ORDER — ASPIRIN EC 81 MG PO TBEC
81.0000 mg | DELAYED_RELEASE_TABLET | Freq: Every day | ORAL | Status: DC
  Filled 2015-12-18: qty 1

## 2015-12-18 MED ORDER — FENTANYL CITRATE (PF) 100 MCG/2ML IJ SOLN
INTRAMUSCULAR | Status: AC
  Filled 2015-12-18: qty 2

## 2015-12-18 MED ORDER — LIDOCAINE HCL (PF) 1 % IJ SOLN
INTRAMUSCULAR | Status: DC | PRN
  Administered 2015-12-18: 12 mL
  Administered 2015-12-18: 3 mL

## 2015-12-18 MED ORDER — SODIUM CHLORIDE 0.9 % WEIGHT BASED INFUSION: 1.0000 mL/kg/h | INTRAVENOUS | Status: DC

## 2015-12-18 MED ORDER — HYDRALAZINE HCL 20 MG/ML IJ SOLN: 10.0000 mg | Freq: Four times a day (QID) | INTRAMUSCULAR | Status: DC | PRN

## 2015-12-18 MED ORDER — HEPARIN (PORCINE) IN NACL 2-0.9 UNIT/ML-% IJ SOLN
INTRAMUSCULAR | Status: AC
  Filled 2015-12-18: qty 1000

## 2015-12-18 MED ORDER — METOPROLOL TARTRATE 25 MG PO TABS
25.0000 mg | ORAL_TABLET | Freq: Two times a day (BID) | ORAL | Status: DC
  Administered 2015-12-19 – 2015-12-20 (×3): 25 mg via ORAL
  Filled 2015-12-18 (×5): qty 1

## 2015-12-18 MED ORDER — FENTANYL CITRATE (PF) 100 MCG/2ML IJ SOLN
INTRAMUSCULAR | Status: DC | PRN
  Administered 2015-12-18: 25 ug via INTRAVENOUS

## 2015-12-18 SURGICAL SUPPLY — 23 items
CATH HEARTRAIL 6F IL4.0 (CATHETERS) ×1 IMPLANT
CATH INFINITI 5F JL4 125CM (CATHETERS) ×1 IMPLANT
CATH INFINITI 5FR JL4 (CATHETERS) ×1 IMPLANT
CATH OPTITORQUE TIG 4.5 5F (CATHETERS) ×1 IMPLANT
CATH VISTA GUIDE 6FR XBLAD4.5 (CATHETERS) ×1 IMPLANT
DEVICE RAD COMP TR BAND LRG (VASCULAR PRODUCTS) ×1 IMPLANT
GLIDESHEATH SLEND A-KIT 6F 22G (SHEATH) ×1 IMPLANT
GUIDE CATH RUNWAY 6FR AL3 (CATHETERS) ×1 IMPLANT
GUIDE CATH RUNWAY 6FR CLS4 (CATHETERS) ×1 IMPLANT
GUIDE CATH RUNWAY 6FR CLS4.5 (CATHETERS) ×1 IMPLANT
GUIDE CATH RUNWAY 6FR FL5 (CATHETERS) ×1 IMPLANT
GUIDE CATH RUNWAY 6FR FL6 (CATHETERS) ×1 IMPLANT
GUIDEWIRE ANGLED .035X150CM (WIRE) ×1 IMPLANT
KIT HEART LEFT (KITS) ×2 IMPLANT
PACK CARDIAC CATHETERIZATION (CUSTOM PROCEDURE TRAY) ×2 IMPLANT
SHEATH PINNACLE 5F 10CM (SHEATH) ×1 IMPLANT
SHEATH PINNACLE 6F 10CM (SHEATH) ×1 IMPLANT
SHEATH PINNACLE ST 6F 45CM (SHEATH) ×1 IMPLANT
TRANSDUCER W/STOPCOCK (MISCELLANEOUS) ×2 IMPLANT
TUBING CIL FLEX 10 FLL-RA (TUBING) ×2 IMPLANT
WIRE EMERALD 3MM-J .035X150CM (WIRE) ×1 IMPLANT
WIRE HI TORQ VERSACORE-J 145CM (WIRE) ×1 IMPLANT
WIRE SAFE-T 1.5MM-J .035X260CM (WIRE) ×1 IMPLANT

## 2015-12-18 NOTE — Progress Notes (Signed)
Pt was found sitting at the bedside with pee on floor, gown and telemetry box removed, and vitals machine unattached to pt. Pt reported that he peed all over the place. Pt is still on bedrest, but refuses to continue laying down. Pt reported that he took the blood pressure cuff off of his arm and has refused to be connected back for frequent vitals after cath. Pt has been educated on the importance of frequent vitals check after cath procedure. Pt appears agitated and requested to be unhooked from IV fluids; however, he has now agreed to receive IV fluids.  Pt's bed has been changed and he is now resting in bed. Telemetry box has been reapplied. Will continue to monitor.  Grant Fontana RN, BSN

## 2015-12-18 NOTE — Progress Notes (Signed)
ANTICOAGULATION CONSULT NOTE - Initial Consult  Pharmacy Consult for Heparin Indication: chest pain/ACS   Patient Measurements: Height: 5\' 9"  (175.3 cm) Weight: 239 lb 6.7 oz (108.6 kg) IBW/kg (Calculated) : 70.7 Heparin Dosing Weight:  95 kg  Vital Signs: Temp: 98 F (36.7 C) (04/17 0512) Temp Source: Oral (04/17 0512) BP: 148/93 mmHg (04/17 1413) Pulse Rate: 119 (04/17 1413)  Labs:  Recent Labs  12/16/15 1837 12/17/15 0125 12/17/15 0714 12/17/15 1834 12/18/15 0235  HGB 12.1* 11.9*  --   --  12.2*  HCT 37.0* 36.5*  --   --  37.8*  PLT 291 282  --   --  278  LABPROT  --   --   --   --  13.8  INR  --   --   --   --  1.04  HEPARINUNFRC  --   --   --  0.29* 0.37  CREATININE 1.10 0.95  --   --  0.93  TROPONINI  --  0.07* 0.63*  --   --     Estimated Creatinine Clearance: 88.5 mL/min (by C-G formula based on Cr of 0.93).  Assessment:  70 y/o M presents with CP, palpitations, SOB x 1 month. EKG with afib>NSR. Pt has a known h/o syncope, aortic root dilatation and aortic insufficiency, HTN, CHF, syncope. EF 11/15 was 55-60%. CHA2DS2VASc score of 3.   Start IV heparin for afib but also for elevated troponins and r/o ischemia. First HL was low at 0.29 so rate increased and was therapeutic. Now s/p cath. They were unable to adequately engage LCA so will reattempt angiography later. Heparin to restart 8 hrs s/p sheath removal. Sheath removed at 1310 per flowsheet. Hgb stable at 12.2, plts wnl. No s/s of bleed.  Goal of Therapy:  Heparin level 0.3-0.7 units/ml Monitor platelets by anticoagulation protocol: Yes   Plan:  No heparin bolus Restart heparin gtt at 1,450 units/hr at 2130 tonight Monitor daily HL, CBC, s/s of bleed  Elenor Quinones, PharmD, [facility] Clinical Pharmacist Pager [phone] 12/18/2015 3:02 PM

## 2015-12-18 NOTE — Progress Notes (Signed)
Hospital Problem List     Principal Problem:   Unstable angina [facility]) Active Problems:   Atrial fibrillation ([place])   Multiple myeloma ([place])     Patient Profile:   Primary Cardiologist:Dr. Hochrein  70 yo male w/ PMH of aortic root dilatation and aortic insufficiency who presented to [facility] ED on 12/16/2015 for evaluation of dyspnea and chest pressure with exertion. Had episodes of PAF this admission. For cardiac cath today.  Subjective   Still having palpitations at times. Denies any chest pressure overnight. No questions or concerns about his cardiac catheterization today.  Inpatient Medications    . amLODipine  10 mg Oral BID  . aspirin EC  81 mg Oral Daily  . metoprolol tartrate  12.5 mg Oral BID  . potassium chloride SA  20 mEq Oral Daily  . sodium chloride flush  3 mL Intravenous Q12H  . tamsulosin  0.8 mg Oral Daily    Vital Signs    Filed Vitals:   12/17/15 1430 12/17/15 2126 12/18/15 0512 12/18/15 0536  BP: 121/71 128/85 145/81   Pulse: 75 53 98   Temp: 98.2 F (36.8 C) 98.2 F (36.8 C) 98 F (36.7 C)   TempSrc: Oral Oral Oral   Resp: _0 Height:      Weight:    239 lb 6.7 oz (108.6 kg)  SpO2: 98% 97% 95%     Intake/Output Summary (Last 24 hours) at 12/18/15 0843 Last data filed at 12/18/15 0824  Gross per 24 hour  Intake    840 ml  Output   4200 ml  Net  -3360 ml   Filed Weights   12/16/15 1830 12/17/15 0046 12/18/15 0536  Weight: 248 lb 14.4 oz (112.9 kg) 242 lb 8.1 oz (110 kg) 239 lb 6.7 oz (108.6 kg)    Physical Exam    General: Well developed, well nourished, male appearing in no acute distress. Head: Normocephalic, atraumatic.  Neck: Supple without bruits, JVD not elevated. Lungs:  Resp regular and unlabored, CTA without wheezing or rales. Heart: Irregularly irregular, S1, S2, no S3, S4, or murmur; no rub. Abdomen: Soft, non-tender, non-distended with normoactive bowel sounds. No hepatomegaly. No rebound/guarding. No  obvious abdominal masses. Extremities: No clubbing or cyanosis, trace edema bilaterally. Distal pedal pulses are 2+ bilaterally. Neuro: Alert and oriented X 3. Moves all extremities spontaneously. Psych: Normal affect.  Labs    CBC  Recent Labs  12/17/15 0125 12/18/15 0235  WBC 8.7 8.4  NEUTROABS 5.2  --   HGB 11.9* 12.2*  HCT 36.5* 37.8*  MCV 91.5 91.5  PLT 282 829   Basic Metabolic Panel  Recent Labs  12/17/15 0125 12/18/15 0235  NA 139 141  K 3.1* 3.4*  CL 103 104  CO2 29 28  GLUCOSE 130* 101*  BUN 11 7  CREATININE 0.95 0.93  CALCIUM 8.8* 8.7*  MG 1.8  --    Liver Function Tests No results for input(s): AST, ALT, ALKPHOS, BILITOT, PROT, ALBUMIN in the last 72 hours. No results for input(s): LIPASE, AMYLASE in the last 72 hours. Cardiac Enzymes  Recent Labs  12/17/15 0125 12/17/15 0714  TROPONINI 0.07* 0.63*   BNP Invalid input(s): POCBNP D-Dimer  Recent Labs  12/16/15 2146  DDIMER 0.45   Hemoglobin A1C No results for input(s): HGBA1C in the last 72 hours. Fasting Lipid Panel No results for input(s): CHOL, HDL, LDLCALC, TRIG, CHOLHDL, LDLDIRECT in the last 72 hours. Thyroid Function Tests  Recent Labs  12/16/15 2010  TSH 1.116    Telemetry    Episodes of PAF with HR in the 120's overnight with frequent PAC's and PVC's when in NSR.  ECG    SR with PAC's, HR 62 with LAD.    Cardiac Studies and Radiology    Dg Chest 2 View: 12/16/2015  CLINICAL DATA:  Chronic arrhythmia and generalized chest pain. Initial encounter. EXAM: CHEST  2 VIEW COMPARISON:  Chest radiograph performed 10/17/2015 FINDINGS: The lungs are well-aerated. Vascular congestion is noted. Mildly increased interstitial markings may reflect mild interstitial edema. There is no evidence of pleural effusion or pneumothorax. The heart is borderline enlarged. No acute osseous abnormalities are seen. IMPRESSION: Vascular congestion and borderline cardiomegaly. Mildly increased  interstitial markings may reflect mild interstitial edema. Electronically Signed   By: [name] M.D.   On: 12/16/2015 19:37    Echocardiogram: Pending  Assessment & Plan    1. Unstable Angina - presented with worsening dyspnea with exertion and chest discomfort for the past few weeks.  - initial troponin values were 0.07 and 0.63. Will recheck to make sure this is trending down. - scheduled for cardiac catheterization with Dr. Ellyn Hack today. - continue ASA, BB, and Heparin. Will check Lipid Panel for risk stratification as he is not on statin therapy.   2. New Onset Paroxysmal Atrial Fibrillation - reports episodes of his heart racing at home. TSH normal.  - alternating between episodes of PAF and sinus rhythm with frequent PAC's and PVC's on telemetry. - This patients CHA2DS2-VASc Score and unadjusted Ischemic Stroke Rate (% per year) is equal to 3.2 % stroke rate/year from a score of 3 (CHF, HTN, Age) at minimum. Currently on Heparin. Will need to switch to NOAC prior to discharge.  - continue BB for rate-control. Will titrate dose due to frequent ectopy on monitor.  3. Aortic Root Dilatation - Ascending aorta measuring 4.8cm by echo in 07/2014.  - Repeat echocardiogram is pending.  4. Hypokalemia - K+ 3.4 on 12/18/2015. - scheduled to be replaced this AM  Signed, Erma Heritage , PA-C 8:43 AM 12/18/2015 Pager: [phone]

## 2015-12-18 NOTE — Progress Notes (Signed)
[facility]                                                                              Patient Demographics  [name] [name], is a 70 y.o. male, DOB - 1946/03/15, [id number]  Admit date - 12/16/2015   Admitting Physician Norval Morton, MD  Outpatient Primary MD for the patient is [name],[name], [facility]:   LOS - 1  days    Chief Complaint  Patient presents with  . Chest Pain       Brief HPI   70 year old male with hypertension, diabetes type 2,COPD, CHF, aortic root dilation/insuffiency presented with chest tightness and palpitations. Patient reported that he has noticed palpitations, shortness of breath and feeling of chest tightness on exertion and walking, progressively worsening over the last 2-3 months. Patient also reported that his potassium has been low, denies any prior history of atrial fibrillation.  Assessment & Plan    Principal Problem:   Atypical Chest pain with new onset atrial fibrillation paroxysmal: Symptoms could be due to ischemia versus due to A. fib with RVR episodic - Troponins positive, cardiology consulted, 2Decho showed EF 50-55%, moderate aortic root dilation  - cardiac cath today - TSH 1.1 - Started on IV heparin drip, BB  Active Problems: Hypokalemia - Replaced  History of aortic root dilatation and aortic insufficiency - 2-D echo showed moderate aortic root dilation. cadiac cath today  COPD - Currently stable, no wheezing  Type 2 diabetes mellitus- diet controlled - Not on any oral hypoglycemics or insulin at home,  hemoglobin A1c 5.5   Code Status: Full code  Family Communication: Discussed in detail with the patient, all imaging results, lab results explained to the patient   Disposition Plan:   Time Spent in minutes   15 minutes  Procedures  Echo Cath   Consults   Cardiology  DVT Prophylaxis heparin drip  Medications  Scheduled Meds: . [MAR Hold] amLODipine  10 mg Oral BID   . [MAR Hold] aspirin EC  81 mg Oral Daily  . [MAR Hold] metoprolol tartrate  25 mg Oral BID  . [MAR Hold] potassium chloride SA  20 mEq Oral Daily  . sodium chloride flush  3 mL Intravenous Q12H  . [MAR Hold] tamsulosin  0.8 mg Oral Daily   Continuous Infusions: . sodium chloride 1 mL/kg/hr (12/18/15 0700)  . heparin Stopped (12/18/15 1011)   PRN Meds:.sodium chloride, [MAR Hold] acetaminophen, [MAR Hold] albuterol, fentaNYL, [MAR Hold] gi cocktail, heparin, iopamidol, lidocaine (PF), midazolam, [MAR Hold]  morphine injection, [MAR Hold] ondansetron (ZOFRAN) IV, Radial Cocktail/Verapamil only, sodium chloride flush   Antibiotics   Anti-infectives    None        Subjective:   [name] [name] was seen and examined today. Feeling better. Patient denies dizziness, chest pain, shortness of breath, abdominal pain, N/V/D/C, new weakness, numbess, tingling.   Objective:   Filed Vitals:   12/18/15 0536 12/18/15 0957 12/18/15 1044 12/18/15 1045  BP:  130/91    Pulse:  64    Temp:      TempSrc:      Resp:  Height:      Weight: 108.6 kg (239 lb 6.7 oz)     SpO2:   95% 95%    Intake/Output Summary (Last 24 hours) at 12/18/15 1247 Last data filed at 12/18/15 0824  Gross per 24 hour  Intake    600 ml  Output   3450 ml  Net  -2850 ml     Wt Readings from Last 3 Encounters:  12/18/15 108.6 kg (239 lb 6.7 oz)  10/17/15 109.317 kg (241 lb)  06/08/15 113.853 kg (251 lb)     Exam  General: Alert and oriented x 3, NAD  HEENT:  .   Neck:   CVS: S1 S2 auscultated, no rubs, murmurs or gallops. Regular rate and rhythm.  Respiratory: Clear to auscultation bilaterally, no wheezing, rales or rhonchi  Abdomen: Soft, nontender, nondistended, + bowel sounds  Ext: no cyanosis clubbing or edema  Neuro: no new deficits  Skin: No rashes  Psych: Normal affect and demeanor, alert and oriented x3    Data Reviewed:  I have personally reviewed following labs and imaging  studies  Micro Results Recent Results (from the past 240 hour(s))  Surgical pcr screen     Status: None   Collection Time: 12/17/15  9:21 PM  Result Value Ref Range Status   MRSA, PCR NEGATIVE NEGATIVE Final   Staphylococcus aureus NEGATIVE NEGATIVE Final    Comment:        The Xpert SA Assay (FDA approved for NASAL specimens in patients over 78 years of age), is one component of a comprehensive surveillance program.  Test performance has been validated by [facility] for patients greater than or equal to 80 year old. It is not intended to diagnose infection nor to guide or monitor treatment.     Radiology Reports Dg Chest 2 View  12/16/2015  CLINICAL DATA:  Chronic arrhythmia and generalized chest pain. Initial encounter. EXAM: CHEST  2 VIEW COMPARISON:  Chest radiograph performed 10/17/2015 FINDINGS: The lungs are well-aerated. Vascular congestion is noted. Mildly increased interstitial markings may reflect mild interstitial edema. There is no evidence of pleural effusion or pneumothorax. The heart is borderline enlarged. No acute osseous abnormalities are seen. IMPRESSION: Vascular congestion and borderline cardiomegaly. Mildly increased interstitial markings may reflect mild interstitial edema. Electronically Signed   By: [name] M.D.   On: 12/16/2015 19:37    CBC  Recent Labs Lab 12/16/15 1837 12/17/15 0125 12/18/15 0235  WBC 9.5 8.7 8.4  HGB 12.1* 11.9* 12.2*  HCT 37.0* 36.5* 37.8*  PLT 291 282 278  MCV 91.1 91.5 91.5  MCH 29.8 29.8 29.5  MCHC 32.7 32.6 32.3  RDW 12.9 12.9 13.0  LYMPHSABS  --  2.3  --   MONOABS  --  0.7  --   EOSABS  --  0.4  --   BASOSABS  --  0.0  --     Chemistries   Recent Labs Lab 12/16/15 1837 12/17/15 0125 12/18/15 0235  NA 137 139 141  K 3.3* 3.1* 3.4*  CL 101 103 104  CO2 26 29 28   GLUCOSE 114* 130* 101*  BUN 13 11 7   CREATININE 1.10 0.95 0.93  CALCIUM 9.0 8.8* 8.7*  MG  --  1.8  --     ------------------------------------------------------------------------------------------------------------------ estimated creatinine clearance is 88.5 mL/min (by C-G formula based on Cr of 0.93). ------------------------------------------------------------------------------------------------------------------  Recent Labs  12/17/15 1123  HGBA1C 5.5   ------------------------------------------------------------------------------------------------------------------ No results for input(s): CHOL, HDL, LDLCALC, TRIG, CHOLHDL, LDLDIRECT  in the last 72 hours. ------------------------------------------------------------------------------------------------------------------  Recent Labs  12/16/15 2010  TSH 1.116   ------------------------------------------------------------------------------------------------------------------ No results for input(s): VITAMINB12, FOLATE, FERRITIN, TIBC, IRON, RETICCTPCT in the last 72 hours.  Coagulation profile  Recent Labs Lab 12/18/15 0235  INR 1.04     Recent Labs  12/16/15 2146  DDIMER 0.45    Cardiac Enzymes  Recent Labs Lab 12/17/15 0125 12/17/15 0714  TROPONINI 0.07* 0.63*   ------------------------------------------------------------------------------------------------------------------ Invalid input(s): POCBNP   Recent Labs  12/17/15 0056 12/17/15 0613 12/17/15 1121 12/17/15 1704 12/17/15 2127 12/18/15 0646  GLUCAP 143* 93 117* 194* 90 36     RAI,RIPUDEEP M.D. [facility] 12/18/2015, 12:47 PM  Pager: [phone] Between 7am to 7pm - call Pager - [phone]  After 7pm go to [email or web address] - password [email or web address]  Call night coverage person covering after 7pm

## 2015-12-18 NOTE — Progress Notes (Signed)
  Echocardiogram 2D Echocardiogram has been performed.  [name] [name] 12/18/2015, 9:54 AM

## 2015-12-18 NOTE — Progress Notes (Signed)
TR band has been removed from right wrist. Gauze and tegaderm dressing has been applied. Site is level 0. Will continue to monitor.   Grant Fontana RN, BSN

## 2015-12-18 NOTE — Progress Notes (Signed)
Site area: right groin a 6 french arterial sheath was removed  Site Prior to Removal:  Level 0  Pressure Applied For 20 MINUTES    Bedrest Beginning at 1330p  Manual:   Yes.    Patient Status During Pull:  stable  Post Pull Groin Site:  Level 0  Post Pull Instructions Given:  Yes.    Post Pull Pulses Present:  Yes.    Dressing Applied:  Yes.    Comments:  BP elevated  Otherwise stable during sheath pull

## 2015-12-18 NOTE — H&P (View-Only) (Signed)
Hospital Problem List     Principal Problem:   Unstable angina [facility]) Active Problems:   Atrial fibrillation ([place])   Multiple myeloma ([place])     Patient Profile:   Primary Cardiologist:Dr. Hochrein  70 yo male w/ PMH of aortic root dilatation and aortic insufficiency who presented to [facility] ED on 12/16/2015 for evaluation of dyspnea and chest pressure with exertion. Had episodes of PAF this admission. For cardiac cath today.  Subjective   Still having palpitations at times. Denies any chest pressure overnight. No questions or concerns about his cardiac catheterization today.  Inpatient Medications    . amLODipine  10 mg Oral BID  . aspirin EC  81 mg Oral Daily  . metoprolol tartrate  12.5 mg Oral BID  . potassium chloride SA  20 mEq Oral Daily  . sodium chloride flush  3 mL Intravenous Q12H  . tamsulosin  0.8 mg Oral Daily    Vital Signs    Filed Vitals:   12/17/15 1430 12/17/15 2126 12/18/15 0512 12/18/15 0536  BP: 121/71 128/85 145/81   Pulse: 75 53 98   Temp: 98.2 F (36.8 C) 98.2 F (36.8 C) 98 F (36.7 C)   TempSrc: Oral Oral Oral   Resp: _0 Height:      Weight:    239 lb 6.7 oz (108.6 kg)  SpO2: 98% 97% 95%     Intake/Output Summary (Last 24 hours) at 12/18/15 0843 Last data filed at 12/18/15 0824  Gross per 24 hour  Intake    840 ml  Output   4200 ml  Net  -3360 ml   Filed Weights   12/16/15 1830 12/17/15 0046 12/18/15 0536  Weight: 248 lb 14.4 oz (112.9 kg) 242 lb 8.1 oz (110 kg) 239 lb 6.7 oz (108.6 kg)    Physical Exam    General: Well developed, well nourished, male appearing in no acute distress. Head: Normocephalic, atraumatic.  Neck: Supple without bruits, JVD not elevated. Lungs:  Resp regular and unlabored, CTA without wheezing or rales. Heart: Irregularly irregular, S1, S2, no S3, S4, or murmur; no rub. Abdomen: Soft, non-tender, non-distended with normoactive bowel sounds. No hepatomegaly. No rebound/guarding. No  obvious abdominal masses. Extremities: No clubbing or cyanosis, trace edema bilaterally. Distal pedal pulses are 2+ bilaterally. Neuro: Alert and oriented X 3. Moves all extremities spontaneously. Psych: Normal affect.  Labs    CBC  Recent Labs  12/17/15 0125 12/18/15 0235  WBC 8.7 8.4  NEUTROABS 5.2  --   HGB 11.9* 12.2*  HCT 36.5* 37.8*  MCV 91.5 91.5  PLT 282 829   Basic Metabolic Panel  Recent Labs  12/17/15 0125 12/18/15 0235  NA 139 141  K 3.1* 3.4*  CL 103 104  CO2 29 28  GLUCOSE 130* 101*  BUN 11 7  CREATININE 0.95 0.93  CALCIUM 8.8* 8.7*  MG 1.8  --    Liver Function Tests No results for input(s): AST, ALT, ALKPHOS, BILITOT, PROT, ALBUMIN in the last 72 hours. No results for input(s): LIPASE, AMYLASE in the last 72 hours. Cardiac Enzymes  Recent Labs  12/17/15 0125 12/17/15 0714  TROPONINI 0.07* 0.63*   BNP Invalid input(s): POCBNP D-Dimer  Recent Labs  12/16/15 2146  DDIMER 0.45   Hemoglobin A1C No results for input(s): HGBA1C in the last 72 hours. Fasting Lipid Panel No results for input(s): CHOL, HDL, LDLCALC, TRIG, CHOLHDL, LDLDIRECT in the last 72 hours. Thyroid Function Tests  Recent Labs  12/16/15 2010  TSH 1.116    Telemetry    Episodes of PAF with HR in the 120's overnight with frequent PAC's and PVC's when in NSR.  ECG    SR with PAC's, HR 62 with LAD.    Cardiac Studies and Radiology    Dg Chest 2 View: 12/16/2015  CLINICAL DATA:  Chronic arrhythmia and generalized chest pain. Initial encounter. EXAM: CHEST  2 VIEW COMPARISON:  Chest radiograph performed 10/17/2015 FINDINGS: The lungs are well-aerated. Vascular congestion is noted. Mildly increased interstitial markings may reflect mild interstitial edema. There is no evidence of pleural effusion or pneumothorax. The heart is borderline enlarged. No acute osseous abnormalities are seen. IMPRESSION: Vascular congestion and borderline cardiomegaly. Mildly increased  interstitial markings may reflect mild interstitial edema. Electronically Signed   By: [name] M.D.   On: 12/16/2015 19:37    Echocardiogram: Pending  Assessment & Plan    1. Unstable Angina - presented with worsening dyspnea with exertion and chest discomfort for the past few weeks.  - initial troponin values were 0.07 and 0.63. Will recheck to make sure this is trending down. - scheduled for cardiac catheterization with Dr. Ellyn Hack today. - continue ASA, BB, and Heparin. Will check Lipid Panel for risk stratification as he is not on statin therapy.   2. New Onset Paroxysmal Atrial Fibrillation - reports episodes of his heart racing at home. TSH normal.  - alternating between episodes of PAF and sinus rhythm with frequent PAC's and PVC's on telemetry. - This patients CHA2DS2-VASc Score and unadjusted Ischemic Stroke Rate (% per year) is equal to 3.2 % stroke rate/year from a score of 3 (CHF, HTN, Age) at minimum. Currently on Heparin. Will need to switch to NOAC prior to discharge.  - continue BB for rate-control. Will titrate dose due to frequent ectopy on monitor.  3. Aortic Root Dilatation - Ascending aorta measuring 4.8cm by echo in 07/2014.  - Repeat echocardiogram is pending.  4. Hypokalemia - K+ 3.4 on 12/18/2015. - scheduled to be replaced this AM  Signed, Erma Heritage , PA-C 8:43 AM 12/18/2015 Pager: [phone]

## 2015-12-18 NOTE — Progress Notes (Signed)
Pt has arrived from cath lab. TR band present on right radial area, with no air. Right groin site is level 0. Pt's vitals are stable and are being monitored every 15 minutes. Pt is on bedrest, which will be completed at 1730. Pt is eating lunch at this time and is in no distress. Will continue to monitor.   Grant Fontana RN, BSN

## 2015-12-18 NOTE — Interval H&P Note (Signed)
History and Physical Interval Note:  12/18/2015 10:34 AM  [name] [name]  has presented today for surgery, with the diagnosis of unstable angina and new diagnosis PAF.    The various methods of treatment have been discussed with the patient and family. After consideration of risks, benefits and other options for treatment, the patient has consented to  Procedure(s): Left Heart Cath and Coronary Angiography (N/A) With Possible Percutaneous Coronary Intervention as a surgical intervention .  The patient's history has been reviewed, patient examined, no change in status, stable for surgery.  I have reviewed the patient's chart and labs.  Questions were answered to the patient's satisfaction.    Cath Lab Visit (complete for each Cath Lab visit)  Clinical Evaluation Leading to the Procedure:   ACS: Yes.    Non-ACS:    Anginal Classification: CCS III  Anti-ischemic medical therapy: Maximal Therapy (2 or more classes of medications)  Non-Invasive Test Results: No non-invasive testing performed  Prior CABG: No previous CABG  TIMI Score  Patient Information:  TIMI Score is 4   UA/NSTEMI and intermediate-risk features (e.g., TIMI score 3-4) for short-term risk of death or nonfatal MI  Revascularization of the presumed culprit artery   A (8)  Indication: 10; Score: 8     HARDING, DAVID W

## 2015-12-19 ENCOUNTER — Encounter (HOSPITAL_COMMUNITY)
Admission: EM | Disposition: A | Discharge status: Home / Self Care | Payer: Self-pay | Source: Home / Self Care | Attending: Internal Medicine

## 2015-12-19 DIAGNOSIS — E876 Hypokalemia: Secondary | ICD-10-CM

## 2015-12-19 LAB — LIPID PANEL
CHOL/HDL RATIO: 4 ratio
CHOLESTEROL: 187 mg/dL (ref 0–200)
HDL: 47 mg/dL (ref >40)
LDL Cholesterol: 118 mg/dL — ABNORMAL HIGH (ref 0–99)
TRIGLYCERIDES: 111 mg/dL (ref <150)
VLDL: 22 mg/dL (ref 0–40)

## 2015-12-19 LAB — BASIC METABOLIC PANEL
Anion gap: 11 (ref 5–15)
BUN: 8 mg/dL (ref 6–20)
CO2: 26 mmol/L (ref 22–32)
Calcium: 8.8 mg/dL — ABNORMAL LOW (ref 8.9–10.3)
Chloride: 104 mmol/L (ref 101–111)
Creatinine, Ser: 0.94 mg/dL (ref 0.61–1.24)
GFR calc Af Amer: >60 mL/min (ref >60)
GLUCOSE: 113 mg/dL — AB (ref 65–99)
Potassium: 3.2 mmol/L — ABNORMAL LOW (ref 3.5–5.1)
Sodium: 141 mmol/L (ref 135–145)

## 2015-12-19 LAB — CBC
HEMATOCRIT: 37.1 % — AB (ref 39.0–52.0)
HEMOGLOBIN: 12 g/dL — AB (ref 13.0–17.0)
MCH: 29.7 pg (ref 26.0–34.0)
MCHC: 32.3 g/dL (ref 30.0–36.0)
MCV: 91.8 fL (ref 78.0–100.0)
Platelets: 315 10*3/uL (ref 150–400)
RBC: 4.04 MIL/uL — AB (ref 4.22–5.81)
RDW: 13 % (ref 11.5–15.5)
WBC: 8 10*3/uL (ref 4.0–10.5)

## 2015-12-19 LAB — GLUCOSE, CAPILLARY: Glucose-Capillary: 101 mg/dL — ABNORMAL HIGH (ref 65–99)

## 2015-12-19 LAB — TROPONIN I: TROPONIN I: 1.76 ng/mL — AB (ref <0.031)

## 2015-12-19 LAB — HEPARIN LEVEL (UNFRACTIONATED): Heparin Unfractionated: 0.33 IU/mL (ref 0.30–0.70)

## 2015-12-19 SURGERY — CORONARY ANGIOGRAM

## 2015-12-19 MED ORDER — MIDAZOLAM HCL 2 MG/2ML IJ SOLN
INTRAMUSCULAR | Status: AC
  Filled 2015-12-19: qty 2

## 2015-12-19 MED ORDER — SODIUM CHLORIDE 0.9 % WEIGHT BASED INFUSION
1.0000 mL/kg/h | INTRAVENOUS | Status: AC
  Administered 2015-12-19: 1 mL/kg/h via INTRAVENOUS

## 2015-12-19 MED ORDER — VERAPAMIL HCL 2.5 MG/ML IV SOLN
INTRAVENOUS | Status: AC
  Filled 2015-12-19: qty 2

## 2015-12-19 MED ORDER — POTASSIUM CHLORIDE CRYS ER 20 MEQ PO TBCR
60.0000 meq | EXTENDED_RELEASE_TABLET | Freq: Once | ORAL | Status: AC
  Administered 2015-12-19: 60 meq via ORAL
  Filled 2015-12-19: qty 3

## 2015-12-19 MED ORDER — HEPARIN SODIUM (PORCINE) 1000 UNIT/ML IJ SOLN
INTRAMUSCULAR | Status: AC
  Filled 2015-12-19: qty 1

## 2015-12-19 MED ORDER — RIVAROXABAN 20 MG PO TABS: 20.0000 mg | ORAL_TABLET | Freq: Every day | ORAL | Status: DC

## 2015-12-19 MED ORDER — VERAPAMIL HCL 2.5 MG/ML IV SOLN
INTRAVENOUS | Status: DC | PRN
  Administered 2015-12-19: 12:00:00 via INTRA_ARTERIAL

## 2015-12-19 MED ORDER — SODIUM CHLORIDE 0.9% FLUSH: 3.0000 mL | INTRAVENOUS | Status: DC | PRN

## 2015-12-19 MED ORDER — MIDAZOLAM HCL 2 MG/2ML IJ SOLN
INTRAMUSCULAR | Status: DC | PRN
  Administered 2015-12-19: 2 mg via INTRAVENOUS

## 2015-12-19 MED ORDER — HEPARIN SODIUM (PORCINE) 1000 UNIT/ML IJ SOLN
INTRAMUSCULAR | Status: DC | PRN
  Administered 2015-12-19: 5500 [IU] via INTRAVENOUS

## 2015-12-19 MED ORDER — HEPARIN (PORCINE) IN NACL 2-0.9 UNIT/ML-% IJ SOLN
INTRAMUSCULAR | Status: DC | PRN
  Administered 2015-12-19: 1000 mL

## 2015-12-19 MED ORDER — SODIUM CHLORIDE 0.9 % IV SOLN: 250.0000 mL | INTRAVENOUS | Status: DC | PRN

## 2015-12-19 MED ORDER — HEPARIN (PORCINE) IN NACL 2-0.9 UNIT/ML-% IJ SOLN
INTRAMUSCULAR | Status: AC
  Filled 2015-12-19: qty 1000

## 2015-12-19 MED ORDER — SODIUM CHLORIDE 0.9 % IV SOLN
INTRAVENOUS | Status: DC | PRN
  Administered 2015-12-19: 109 mL/kg/h via INTRAVENOUS

## 2015-12-19 MED ORDER — FENTANYL CITRATE (PF) 100 MCG/2ML IJ SOLN
INTRAMUSCULAR | Status: AC
  Filled 2015-12-19: qty 2

## 2015-12-19 MED ORDER — LIDOCAINE HCL (PF) 1 % IJ SOLN
INTRAMUSCULAR | Status: DC | PRN
  Administered 2015-12-19: 2 mL
  Administered 2015-12-19: 13 mL

## 2015-12-19 MED ORDER — SODIUM CHLORIDE 0.9% FLUSH: 3.0000 mL | Freq: Two times a day (BID) | INTRAVENOUS | Status: DC

## 2015-12-19 MED ORDER — LIDOCAINE HCL (PF) 1 % IJ SOLN
INTRAMUSCULAR | Status: AC
  Filled 2015-12-19: qty 30

## 2015-12-19 MED ORDER — IOPAMIDOL (ISOVUE-370) INJECTION 76%
INTRAVENOUS | Status: DC | PRN
Start: 2015-12-19 — End: 2015-12-19
  Administered 2015-12-19: 110 mL via INTRA_ARTERIAL

## 2015-12-19 MED ORDER — FENTANYL CITRATE (PF) 100 MCG/2ML IJ SOLN
INTRAMUSCULAR | Status: DC | PRN
  Administered 2015-12-19: 25 ug via INTRAVENOUS

## 2015-12-19 MED FILL — Heparin Sodium (Porcine) 2 Unit/ML in Sodium Chloride 0.9%: INTRAMUSCULAR | Qty: 1000 | Status: AC

## 2015-12-19 SURGICAL SUPPLY — 17 items
ANGIOSEAL 8FR (Vascular Products) ×4 IMPLANT
CATH INFINITI 6F FL5 (CATHETERS) ×3 IMPLANT
CATH INFINITI JR4 5F (CATHETERS) ×3 IMPLANT
DEVICE CLOSURE ANGIOSEAL 8FR (Vascular Products) ×1 IMPLANT
DEVICE RAD COMP TR BAND LRG (VASCULAR PRODUCTS) ×3 IMPLANT
GLIDESHEATH SLEND SS 6F .021 (SHEATH) ×3 IMPLANT
GUIDEWIRE ANGLED .035X150CM (WIRE) ×3 IMPLANT
KIT HEART LEFT (KITS) ×4 IMPLANT
PACK CARDIAC CATHETERIZATION (CUSTOM PROCEDURE TRAY) ×4 IMPLANT
SHEATH PINNACLE 6F 10CM (SHEATH) ×3 IMPLANT
SHEATH PINNACLE 7F 10CM (SHEATH) ×3 IMPLANT
SHEATH PINNACLE ST 7F 65CM (SHEATH) ×3 IMPLANT
TRANSDUCER W/STOPCOCK (MISCELLANEOUS) ×4 IMPLANT
TUBING CIL FLEX 10 FLL-RA (TUBING) ×4 IMPLANT
WIRE HI TORQ VERSACORE-J 145CM (WIRE) ×3 IMPLANT
WIRE ROSEN-J .035X260CM (WIRE) ×3 IMPLANT
WIRE SAFE-T 1.5MM-J .035X260CM (WIRE) ×3 IMPLANT

## 2015-12-19 NOTE — Progress Notes (Signed)
ANTICOAGULATION CONSULT NOTE    Pharmacy Consult for Heparin Indication: chest pain/ACS   Patient Measurements: Height: 5\' 9"  (175.3 cm) Weight: 239 lb 6.7 oz (108.6 kg) IBW/kg (Calculated) : 70.7 Heparin Dosing Weight:  95 kg  Vital Signs: Temp: 98.1 F (36.7 C) (04/18 0512) Temp Source: Oral (04/18 0512) BP: 125/80 mmHg (04/18 0512) Pulse Rate: 71 (04/18 0512)  Labs:  Recent Labs  12/16/15 1837 12/17/15 0125 12/17/15 0714 12/17/15 1834 12/18/15 0235 12/19/15 0459  HGB 12.1* 11.9*  --   --  12.2* 12.0*  HCT 37.0* 36.5*  --   --  37.8* 37.1*  PLT 291 282  --   --  278 315  LABPROT  --   --   --   --  13.8  --   INR  --   --   --   --  1.04  --   HEPARINUNFRC  --   --   --  0.29* 0.37 0.33  CREATININE 1.10 0.95  --   --  0.93  --   TROPONINI  --  0.07* 0.63*  --   --   --     Estimated Creatinine Clearance: 88.5 mL/min (by C-G formula based on Cr of 0.93).  Assessment:  70 y/o M presents with CP, palpitations, SOB x 1 month. EKG with afib>NSR. Pt has a known h/o syncope, aortic root dilatation and aortic insufficiency, HTN, CHF, syncope. EF 11/15 was 55-60%. CHA2DS2VASc score of 3.   Start IV heparin for afib but also for elevated troponins and r/o ischemia. First HL was low at 0.29 so rate increased and was therapeutic on 1450 units/hr. Now s/p cath. They were unable to adequately engage LCA so will reattempt angiography later. Heparin to restart 8 hrs s/p sheath removal. Sheath removed at 1310 per flowsheet. Hgb stable at 12.2, plts wnl. No s/s of bleed.   HL 0.33 after restart, CBC stable with no sxs of bleeding.   Goal of Therapy:  Heparin level 0.3-0.7 units/ml Monitor platelets by anticoagulation protocol: Yes   Plan:  1. Continue heparin gtt at 1,450 units/hr 2. Monitor daily HL, CBC, s/s of bleed  Vincenza Hews, PharmD, BCPS 12/19/2015, 6:22 AM Pager: [phone]

## 2015-12-19 NOTE — Progress Notes (Signed)
ANTICOAGULATION CONSULT NOTE - Initial Consult  Pharmacy Consult for Xarelto Indication: atrial fibrillation  Allergies  Allergen Reactions  . Amoxicillin Other (See Comments)    Can't move Has patient had a PCN reaction causing immediate rash, facial/tongue/throat swelling, SOB or lightheadedness with hypotension: No Has patient had a PCN reaction causing severe rash involving mucus membranes or skin necrosis: No Has patient had a PCN reaction that required hospitalization No Has patient had a PCN reaction occurring within the last 10 years: No If all of the above answers are "NO", then may proceed with Cephalosporin use.   [name] Penicillins Other (See Comments)    Can't move/dizziness Has patient had a PCN reaction causing immediate rash, facial/tongue/throat swelling, SOB or lightheadedness with hypotension: No Has patient had a PCN reaction causing severe rash involving mucus membranes or skin necrosis: No Has patient had a PCN reaction that required hospitalization No Has patient had a PCN reaction occurring within the last 10 years: No If all of the above answers are "NO", then may proceed with Cephalosporin use.     Patient Measurements: Height: 5\' 9"  (175.3 cm) Weight: 239 lb 6.7 oz (108.6 kg) IBW/kg (Calculated) : 70.7   Vital Signs: Temp: 98.2 F (36.8 C) (04/18 1048) Temp Source: Oral (04/18 1048) BP: 150/94 mmHg (04/18 1300) Pulse Rate: 72 (04/18 1300)  Labs:  Recent Labs  12/17/15 0125 12/17/15 0714 12/17/15 1834 12/18/15 0235 12/19/15 0459  HGB 11.9*  --   --  12.2* 12.0*  HCT 36.5*  --   --  37.8* 37.1*  PLT 282  --   --  278 315  LABPROT  --   --   --  13.8  --   INR  --   --   --  1.04  --   HEPARINUNFRC  --   --  0.29* 0.37 0.33  CREATININE 0.95  --   --  0.93 0.94  TROPONINI 0.07* 0.63*  --   --  1.76*    Estimated Creatinine Clearance: 87.6 mL/min (by C-G formula based on Cr of 0.94).   Medical History: Past Medical History  Diagnosis Date   . Syncope   . Hypertension   . COPD (chronic obstructive pulmonary disease) ([place])   . Hyperlipidemia   . Anxiety   . CHF (congestive heart failure) ([place])   . Childhood asthma   . Aortic root dilatation ([place])     Archie Endo 10/17/2015  . Pneumonia     "2-3 times" (10/17/2015)  . Aortic insufficiency     Archie Endo 10/17/2015  . Sleep apnea     "don't use mask since I lost weight" (10/17/2015)  . Diabetes mellitus type II     diet controlled  . Renal cell carcinoma ([place])     I do not see confirmation of this diagnosis.  He sees a urologist currently.    . Prostate cancer [facility])     "doctor saw a spot on my prostate"   . Paroxysmal atrial fibrillation ([place]) 12/2015    Medications:  Prescriptions prior to admission  Medication Sig Dispense Refill Last Dose  . albuterol (PROVENTIL HFA;VENTOLIN HFA) 108 (90 Base) MCG/ACT inhaler Inhale 2 puffs into the lungs every 6 (six) hours as needed for wheezing or shortness of breath. 1 Inhaler 3 Past Week at Unknown time  . albuterol (PROVENTIL) (5 MG/ML) 0.5% nebulizer solution Take 0.5 mLs (2.5 mg total) by nebulization every 6 (six) hours as needed for wheezing or shortness of breath. [place]  mL 12 Past Week at Unknown time  . amLODipine (NORVASC) 10 MG tablet Take 10 mg by mouth 2 (two) times daily.    12/16/2015 at AM  . KLOR-CON M20 20 MEQ tablet Take 20 mEq by mouth daily.   12/16/2015 at Unknown time  . tamsulosin (FLOMAX) 0.4 MG CAPS capsule Take 0.8 mg by mouth daily as needed (FOR PROSTATE).    Past Month at Unknown time    Assessment: Pt admitted with chest pain and new onset AFib. Pt had cath yesterday and today dud to not being able to access the LCA due tortuous veins. Heparin gtt has been discontinued s/p procedure with plan to begin Xarelto tomorrow for AFib.  Scr 0.9, CBC wnl.   Goal of Therapy:  Monitor platelets by anticoagulation protocol: Yes   Plan:  -Xarelto 20 mg po daily beginning tomorrow -Monitor s/sx bleeding   Harvel Quale 12/19/2015,2:27 PM

## 2015-12-19 NOTE — H&P (View-Only) (Signed)
    Subjective:  No chest pain or dyspnea.  Objective:  Vital Signs in the last 24 hours: Temp:  [98 F (36.7 C)-98.1 F (36.7 C)] 98.1 F (36.7 C) (04/18 0512) Pulse Rate:  [0-121] 71 (04/18 0512) Resp:  [0-32] 18 (04/18 0512) BP: (125-180)/(80-126) 125/80 mmHg (04/18 0512) SpO2:  [0 %-100 %] 95 % (04/18 0512)  Intake/Output from previous day: 04/17 0701 - 04/18 0700 In: 480 [P.O.:480] Out: 1000 [Urine:1000]  Physical Exam: Pt is alert and oriented, NAD HEENT: normal Neck: JVP - normal, carotids 2+= without bruits Lungs: CTA bilaterally CV: RRR with 2/6 SEM at the LSB Abd: soft, NT, Positive BS, no hepatomegaly Ext: no C/C/E, distal pulses intact and equal Skin: warm/dry no rash   Lab Results:  Recent Labs  12/18/15 0235 12/19/15 0459  WBC 8.4 8.0  HGB 12.2* 12.0*  PLT 278 315    Recent Labs  12/18/15 0235 12/19/15 0459  NA 141 141  K 3.4* 3.2*  CL 104 104  CO2 28 26  GLUCOSE 101* 113*  BUN 7 8  CREATININE 0.93 0.94    Recent Labs  12/17/15 0714 12/19/15 0459  TROPONINI 0.63* 1.76*    Cardiac Studies: Cath results from yesterday noted  Tele: Sinus rhythm  Assessment/Plan:  1. NSTEMI: had cath yesterday, but unable to visualize the LCA from right radial or right groin access. Plan left radial access today to complete his cath procedure and evaluate for obstructive CAD. Troponin up to 1.76 this am. On ASA, metoprolol. Add high-intensity statin with LDL 118 mg/dL.   2. HTN - continue amlodipine and metoprolol  3. Hypokalemia - replete potassium  4. Paroxysmal atrial fibrillation - anticipate starting Xarelto 20 mg daily after cath/PCI  Sherren Mocha, M.D. 12/19/2015, 9:35 AM

## 2015-12-19 NOTE — Interval H&P Note (Signed)
History and Physical Interval Note:  12/19/2015 11:03 AM  [name] [name]  has presented today for surgery, with the diagnosis of cp  The various methods of treatment have been discussed with the patient and family. After consideration of risks, benefits and other options for treatment, the patient has consented to  Procedure(s): Left Heart Cath and Coronary Angiography (N/A) as a surgical intervention .  The patient's history has been reviewed, patient examined, no change in status, stable for surgery.  I have reviewed the patient's chart and labs.  Questions were answered to the patient's satisfaction.   Cath Lab Visit (complete for each Cath Lab visit)  Clinical Evaluation Leading to the Procedure:   ACS: Yes.    Non-ACS:    Anginal Classification: CCS III  Anti-ischemic medical therapy: Minimal Therapy (1 class of medications)  Non-Invasive Test Results: No non-invasive testing performed  Prior CABG: No previous CABG        Collier Salina [facility] 12/19/2015 11:03 AM

## 2015-12-19 NOTE — Progress Notes (Signed)
[facility]                                                                              Patient Demographics  [name] [name], is a 70 y.o. male, DOB - August 08, 1946, [id number]  Admit date - 12/16/2015   Admitting Physician Norval Morton, MD  Outpatient Primary MD for the patient is [name],[name], [place] specialists:   LOS - 2  days    Chief Complaint  Patient presents with  . Chest Pain       Brief HPI   70 year old male with hypertension, diabetes type 2,COPD, CHF, aortic root dilation/insuffiency presented with chest tightness and palpitations. Patient reported that he has noticed palpitations, shortness of breath and feeling of chest tightness on exertion and walking, progressively worsening over the last 2-3 months. Patient also reported that his potassium has been low, denies any prior history of atrial fibrillation.  Assessment & Plan    Principal Problem:   Atypical Chest pain with new onset atrial fibrillation paroxysmal: Symptoms could be due to ischemia versus due to A. fib with RVR episodic - Troponins positive, cardiology consulted, 2Decho showed EF 50-55%, moderate aortic root dilation  - cardiac cath 4/17 showed OM 100% stenosed, RPDA-2 lesion, 60% stenosedRPDA-1 lesion, 40% stenosed. However tortuous peripheral arteries and unable to engage the left coronary. Patient to have repeat cardiac cath today. - TSH 1.1 - Started on IV heparin drip, BB  Active Problems: Hypokalemia - Replaced  History of aortic root dilatation and aortic insufficiency - 2-D echo showed moderate aortic root dilation. cadiac cath today  COPD - Currently stable, no wheezing  Type 2 diabetes mellitus- diet controlled - Not on any oral hypoglycemics or insulin at home,  hemoglobin A1c 5.5   Code Status: Full code  Family Communication: Discussed in detail with the patient, all imaging results, lab results explained to the patient   Disposition Plan:    Time Spent in minutes   15 minutes  Procedures  Echo Cath   Consults   Cardiology  DVT Prophylaxis heparin drip  Medications  Scheduled Meds: . [MAR Hold] amLODipine  10 mg Oral BID  . [MAR Hold] aspirin EC  81 mg Oral Daily  . [MAR Hold] metoprolol tartrate  25 mg Oral BID  . [MAR Hold] potassium chloride SA  20 mEq Oral Daily  . [MAR Hold] sodium chloride flush  3 mL Intravenous Q12H  . sodium chloride flush  3 mL Intravenous Q12H  . [MAR Hold] tamsulosin  0.8 mg Oral Daily   Continuous Infusions: . sodium chloride 109 mL/kg/hr (12/19/15 1217)  . sodium chloride 1 mL/kg/hr (12/19/15 0727)  . heparin 1,450 Units/hr (12/19/15 0531)  . heparin     PRN Meds:.[MAR Hold] sodium chloride, sodium chloride, sodium chloride, [MAR Hold] acetaminophen, [MAR Hold] albuterol, fentaNYL, [MAR Hold] gi cocktail, heparin, heparin, [MAR Hold] hydrALAZINE, lidocaine (PF), midazolam, [MAR Hold]  morphine injection, [MAR Hold] ondansetron (ZOFRAN) IV, Radial Cocktail/Verapamil only, [MAR Hold] sodium chloride flush, sodium chloride flush   Antibiotics   Anti-infectives    None        Subjective:   [name] Dates  [name] was seen and examined today. Feeling better. Denies any specific complaints. Repeat cardiac cath today. Patient denies dizziness, chest pain, shortness of breath, abdominal pain, N/V/D/C, new weakness, numbess, tingling.   Objective:   Filed Vitals:   12/18/15 1413 12/18/15 2043 12/19/15 0512 12/19/15 1048  BP: 148/93 126/90 125/80 126/86  Pulse: 119 63 71 82  Temp:  98 F (36.7 C) 98.1 F (36.7 C) 98.2 F (36.8 C)  TempSrc:  Oral Oral Oral  Resp:  18 18 16   Height:      Weight:      SpO2: 96% 97% 95% 97%    Intake/Output Summary (Last 24 hours) at 12/19/15 1228 Last data filed at 12/19/15 0840  Gross per 24 hour  Intake    480 ml  Output    750 ml  Net   -270 ml     Wt Readings from Last 3 Encounters:  12/18/15 108.6 kg (239 lb 6.7 oz)  10/17/15 109.317 kg  (241 lb)  06/08/15 113.853 kg (251 lb)     Exam  General: Alert and oriented x 3, NAD  HEENT:  .   Neck:   CVS: S1 S2 clear, RRR  Respiratory: CTAB, no mrg  Abdomen: Soft, nontender, nondistended, + bowel sounds  Ext: no cyanosis clubbing or edema  Neuro: no new deficits  Skin: No rashes  Psych: Normal affect and demeanor, alert and oriented x3    Data Reviewed:  I have personally reviewed following labs and imaging studies  Micro Results Recent Results (from the past 240 hour(s))  Surgical pcr screen     Status: None   Collection Time: 12/17/15  9:21 PM  Result Value Ref Range Status   MRSA, PCR NEGATIVE NEGATIVE Final   Staphylococcus aureus NEGATIVE NEGATIVE Final    Comment:        The Xpert SA Assay (FDA approved for NASAL specimens in patients over 69 years of age), is one component of a comprehensive surveillance program.  Test performance has been validated by [facility] for patients greater than or equal to 47 year old. It is not intended to diagnose infection nor to guide or monitor treatment.     Radiology Reports Dg Chest 2 View  12/16/2015  CLINICAL DATA:  Chronic arrhythmia and generalized chest pain. Initial encounter. EXAM: CHEST  2 VIEW COMPARISON:  Chest radiograph performed 10/17/2015 FINDINGS: The lungs are well-aerated. Vascular congestion is noted. Mildly increased interstitial markings may reflect mild interstitial edema. There is no evidence of pleural effusion or pneumothorax. The heart is borderline enlarged. No acute osseous abnormalities are seen. IMPRESSION: Vascular congestion and borderline cardiomegaly. Mildly increased interstitial markings may reflect mild interstitial edema. Electronically Signed   By: [name] M.D.   On: 12/16/2015 19:37    CBC  Recent Labs Lab 12/16/15 1837 12/17/15 0125 12/18/15 0235 12/19/15 0459  WBC 9.5 8.7 8.4 8.0  HGB 12.1* 11.9* 12.2* 12.0*  HCT 37.0* 36.5* 37.8* 37.1*  PLT 291 282  278 315  MCV 91.1 91.5 91.5 91.8  MCH 29.8 29.8 29.5 29.7  MCHC 32.7 32.6 32.3 32.3  RDW 12.9 12.9 13.0 13.0  LYMPHSABS  --  2.3  --   --   MONOABS  --  0.7  --   --   EOSABS  --  0.4  --   --   BASOSABS  --  0.0  --   --     Chemistries   Recent Labs Lab 12/16/15 1837  12/17/15 0125 12/18/15 0235 12/19/15 0459  NA 137 139 141 141  K 3.3* 3.1* 3.4* 3.2*  CL 101 103 104 104  CO2 26 29 28 26   GLUCOSE 114* 130* 101* 113*  BUN 13 11 7 8   CREATININE 1.10 0.95 0.93 0.94  CALCIUM 9.0 8.8* 8.7* 8.8*  MG  --  1.8  --   --    ------------------------------------------------------------------------------------------------------------------ estimated creatinine clearance is 87.6 mL/min (by C-G formula based on Cr of 0.94). ------------------------------------------------------------------------------------------------------------------  Recent Labs  12/17/15 1123  HGBA1C 5.5   ------------------------------------------------------------------------------------------------------------------  Recent Labs  12/19/15 0459  CHOL 187  HDL 47  LDLCALC 118*  TRIG 111  CHOLHDL 4.0   ------------------------------------------------------------------------------------------------------------------  Recent Labs  12/16/15 2010  TSH 1.116   ------------------------------------------------------------------------------------------------------------------ No results for input(s): VITAMINB12, FOLATE, FERRITIN, TIBC, IRON, RETICCTPCT in the last 72 hours.  Coagulation profile  Recent Labs Lab 12/18/15 0235  INR 1.04     Recent Labs  12/16/15 2146  DDIMER 0.45    Cardiac Enzymes  Recent Labs Lab 12/17/15 0125 12/17/15 0714 12/19/15 0459  TROPONINI 0.07* 0.63* 1.76*   ------------------------------------------------------------------------------------------------------------------ Invalid input(s): POCBNP   Recent Labs  12/17/15 0056 12/17/15 0613  12/17/15 1121 12/17/15 1704 12/17/15 2127 12/18/15 0646  GLUCAP 143* 93 117* 194* 90 61     RAI,RIPUDEEP M.D. [facility] 12/19/2015, 12:28 PM  Pager: [phone] Between 7am to 7pm - call Pager - [phone]  After 7pm go to [email or web address] - password [email or web address]  Call night coverage person covering after 7pm

## 2015-12-19 NOTE — Progress Notes (Signed)
    Subjective:  No chest pain or dyspnea.  Objective:  Vital Signs in the last 24 hours: Temp:  [98 F (36.7 C)-98.1 F (36.7 C)] 98.1 F (36.7 C) (04/18 0512) Pulse Rate:  [0-121] 71 (04/18 0512) Resp:  [0-32] 18 (04/18 0512) BP: (125-180)/(80-126) 125/80 mmHg (04/18 0512) SpO2:  [0 %-100 %] 95 % (04/18 0512)  Intake/Output from previous day: 04/17 0701 - 04/18 0700 In: 480 [P.O.:480] Out: 1000 [Urine:1000]  Physical Exam: Pt is alert and oriented, NAD HEENT: normal Neck: JVP - normal, carotids 2+= without bruits Lungs: CTA bilaterally CV: RRR with 2/6 SEM at the LSB Abd: soft, NT, Positive BS, no hepatomegaly Ext: no C/C/E, distal pulses intact and equal Skin: warm/dry no rash   Lab Results:  Recent Labs  12/18/15 0235 12/19/15 0459  WBC 8.4 8.0  HGB 12.2* 12.0*  PLT 278 315    Recent Labs  12/18/15 0235 12/19/15 0459  NA 141 141  K 3.4* 3.2*  CL 104 104  CO2 28 26  GLUCOSE 101* 113*  BUN 7 8  CREATININE 0.93 0.94    Recent Labs  12/17/15 0714 12/19/15 0459  TROPONINI 0.63* 1.76*    Cardiac Studies: Cath results from yesterday noted  Tele: Sinus rhythm  Assessment/Plan:  1. NSTEMI: had cath yesterday, but unable to visualize the LCA from right radial or right groin access. Plan left radial access today to complete his cath procedure and evaluate for obstructive CAD. Troponin up to 1.76 this am. On ASA, metoprolol. Add high-intensity statin with LDL 118 mg/dL.   2. HTN - continue amlodipine and metoprolol  3. Hypokalemia - replete potassium  4. Paroxysmal atrial fibrillation - anticipate starting Xarelto 20 mg daily after cath/PCI  Sherren Mocha, M.D. 12/19/2015, 9:35 AM

## 2015-12-20 ENCOUNTER — Encounter (HOSPITAL_COMMUNITY): Payer: Self-pay | Admitting: Student

## 2015-12-20 DIAGNOSIS — I209 Angina pectoris, unspecified: Secondary | ICD-10-CM

## 2015-12-20 LAB — BASIC METABOLIC PANEL
Anion gap: 9 (ref 5–15)
BUN: 9 mg/dL (ref 6–20)
CALCIUM: 9.1 mg/dL (ref 8.9–10.3)
CO2: 25 mmol/L (ref 22–32)
Chloride: 106 mmol/L (ref 101–111)
Creatinine, Ser: 1.02 mg/dL (ref 0.61–1.24)
GFR calc Af Amer: >60 mL/min (ref >60)
GLUCOSE: 104 mg/dL — AB (ref 65–99)
Potassium: 3.7 mmol/L (ref 3.5–5.1)
Sodium: 140 mmol/L (ref 135–145)

## 2015-12-20 LAB — CBC
HCT: 35.9 % — ABNORMAL LOW (ref 39.0–52.0)
Hemoglobin: 11.6 g/dL — ABNORMAL LOW (ref 13.0–17.0)
MCH: 29.7 pg (ref 26.0–34.0)
MCHC: 32.3 g/dL (ref 30.0–36.0)
MCV: 92.1 fL (ref 78.0–100.0)
Platelets: 291 10*3/uL (ref 150–400)
RBC: 3.9 MIL/uL — ABNORMAL LOW (ref 4.22–5.81)
RDW: 13.1 % (ref 11.5–15.5)
WBC: 8.7 10*3/uL (ref 4.0–10.5)

## 2015-12-20 MED ORDER — RIVAROXABAN 20 MG PO TABS: 20.0000 mg | ORAL_TABLET | Freq: Every day | ORAL | Status: DC

## 2015-12-20 MED ORDER — ATORVASTATIN CALCIUM 20 MG PO TABS: 20.0000 mg | ORAL_TABLET | Freq: Every day | ORAL | Status: DC

## 2015-12-20 MED ORDER — METOPROLOL TARTRATE 25 MG PO TABS: 25.0000 mg | ORAL_TABLET | Freq: Two times a day (BID) | ORAL | Status: DC

## 2015-12-20 NOTE — Care Management Note (Signed)
Case Management Note Marvetta Gibbons RN, BSN Unit 2W-Case Manager [phone]  Patient Details  Name: [name] [name] MRN: [id number] Date of Birth: June 16, 1946  Subjective/Objective:    Pt admitted with NSTEMI               Action/Plan: PTA Pt lived at home- has been started on Xarelto per bedside RN- 30 day free card given to pt   Expected Discharge Date:   12/20/15               Expected Discharge Plan:  Home/Self Care  In-House Referral:     Discharge planning Services  CM Consult, Medication Assistance  Post Acute Care Choice:    Choice offered to:     DME Arranged:    DME Agency:     HH Arranged:    [place] Agency:     Status of Service:  Completed, signed off  Medicare Important Message Given:  Yes Date Medicare IM Given:    Medicare IM give by:    Date Additional Medicare IM Given:    Additional Medicare Important Message give by:     If discussed at [place] of Stay Meetings, dates discussed:    Additional Comments:  Dawayne Patricia, RN 12/20/2015, 1:34 PM

## 2015-12-20 NOTE — Discharge Instructions (Signed)

## 2015-12-20 NOTE — Care Management Important Message (Signed)
Important Message  Patient Details  Name: [name] [name] MRN: [id number] Date of Birth: 1946-05-28   Medicare Important Message Given:  Yes    Nathen May 12/20/2015, 9:54 AM

## 2015-12-20 NOTE — Discharge Summary (Signed)
Physician Discharge Summary   Patient ID: [name] [name] MRN: [id number] DOB/AGE: 1946/05/06 70 y.o.  Admit date: 12/16/2015 Discharge date: 12/20/2015  Primary Care Physician:  Vicenta Aly, [place]  Discharge Diagnoses:    . Unstable angina / NSTEMI- Status post cardiac cath  .  Paroxysmal atrial fibrillation (HCC) . Multiple myeloma ([place]) . HLD (hyperlipidemia) . Essential (primary) hypertension   New onset paroxysmal atrial fibrillation   History of aortic root and palpitation and aortic insufficiency   COPD   Type 2 diabetes mellitus  Consults: Cardiology  Recommendations for Outpatient Follow-up:  1. Please repeat CBC/BMET at next visit   DIET: Carb modified diet    Allergies:   Allergies  Allergen Reactions  . Amoxicillin Other (See Comments)    Can't move Has patient had a PCN reaction causing immediate rash, facial/tongue/throat swelling, SOB or lightheadedness with hypotension: No Has patient had a PCN reaction causing severe rash involving mucus membranes or skin necrosis: No Has patient had a PCN reaction that required hospitalization No Has patient had a PCN reaction occurring within the last 10 years: No If all of the above answers are "NO", then may proceed with Cephalosporin use.   [name] Penicillins Other (See Comments)    Can't move/dizziness Has patient had a PCN reaction causing immediate rash, facial/tongue/throat swelling, SOB or lightheadedness with hypotension: No Has patient had a PCN reaction causing severe rash involving mucus membranes or skin necrosis: No Has patient had a PCN reaction that required hospitalization No Has patient had a PCN reaction occurring within the last 10 years: No If all of the above answers are "NO", then may proceed with Cephalosporin use.      DISCHARGE MEDICATIONS: Current Discharge Medication List    START taking these medications   Details  atorvastatin (LIPITOR) 20 MG tablet Take 1 tablet (20 mg total) by  mouth at bedtime. Qty: 20 tablet, Refills: 3    metoprolol tartrate (LOPRESSOR) 25 MG tablet Take 1 tablet (25 mg total) by mouth 2 (two) times daily. Qty: 60 tablet, Refills: 3    rivaroxaban (XARELTO) 20 MG TABS tablet Take 1 tablet (20 mg total) by mouth daily with supper. Qty: 30 tablet, Refills: 3      CONTINUE these medications which have NOT CHANGED   Details  albuterol (PROVENTIL HFA;VENTOLIN HFA) 108 (90 Base) MCG/ACT inhaler Inhale 2 puffs into the lungs every 6 (six) hours as needed for wheezing or shortness of breath. Qty: 1 Inhaler, Refills: 3    albuterol (PROVENTIL) (5 MG/ML) 0.5% nebulizer solution Take 0.5 mLs (2.5 mg total) by nebulization every 6 (six) hours as needed for wheezing or shortness of breath. Qty: 20 mL, Refills: 12    amLODipine (NORVASC) 10 MG tablet Take 10 mg by mouth 2 (two) times daily.     KLOR-CON M20 20 MEQ tablet Take 20 mEq by mouth daily.    tamsulosin (FLOMAX) 0.4 MG CAPS capsule Take 0.8 mg by mouth daily as needed (FOR PROSTATE).          Brief H and P: For complete details please refer to admission H and P, but in brief70 year old male with hypertension, diabetes type 2,COPD, CHF, aortic root dilation/insuffiency presented with chest tightness and palpitations. Patient reported that he has noticed palpitations, shortness of breath and feeling of chest tightness on exertion and walking, progressively worsening over the last 2-3 months. Patient also reported that his potassium has been low, denies any prior history of atrial fibrillation.  Hospital Course:  Atypical Chest pain with new onset atrial fibrillation paroxysmal: Symptoms could be due to ischemia versus due to A. fib with RVR episodic - Troponins positive, cardiology consulted, 2Decho showed EF 50-55%, moderate aortic root dilation  - cardiac cath 4/17 showed OM 100% stenosed, RPDA-2 lesion, 60% stenosedRPDA-1 lesion, 40% stenosed. However tortuous peripheral arteries and  unable to engage the left coronary. Patient to have repeat cardiac cath today. - TSH 1.1 - Patient was placed on IV heparin drip, beta blocker. He underwent repeat cardiac catheterization which showed nonobstructive disease in the LCA and medical management was recommended.  - Patient was discharged on Lipitor, xarelto, metoprolol.  Active Problems: Hypokalemia - Replaced  History of aortic root dilatation and aortic insufficiency - 2-D echo showed moderate aortic root dilation.   COPD - Currently stable, no wheezing  Type 2 diabetes mellitus- diet controlled - Not on any oral hypoglycemics or insulin at home, hemoglobin A1c 5.5  Day of Discharge BP 125/78 mmHg  Pulse 81  Temp(Src) 97.7 F (36.5 C) (Oral)  Resp 18  Ht 5' 9"  (1.753 m)  Wt 108.6 kg (239 lb 6.7 oz)  BMI 35.34 kg/m2  SpO2 97%  Physical Exam: General: Alert and awake oriented x3 not in any acute distress. HEENT: anicteric sclera, pupils reactive to light and accommodation CVS: S1-S2 clear no murmur rubs or gallops Chest: clear to auscultation bilaterally, no wheezing rales or rhonchi Abdomen: soft nontender, nondistended, normal bowel sounds Extremities: no cyanosis, clubbing or edema noted bilaterally Neuro: Cranial nerves II-XII intact, no focal neurological deficits   The results of significant diagnostics from this hospitalization (including imaging, microbiology, ancillary and laboratory) are listed below for reference.    LAB RESULTS: Basic Metabolic Panel:  Recent Labs Lab 12/17/15 0125  12/19/15 0459 12/20/15 0334  NA 139  < > 141 140  K 3.1*  < > 3.2* 3.7  CL 103  < > 104 106  CO2 29  < > 26 25  GLUCOSE 130*  < > 113* 104*  BUN 11  < > 8 9  CREATININE 0.95  < > 0.94 1.02  CALCIUM 8.8*  < > 8.8* 9.1  MG 1.8  --   --   --   < > = values in this interval not displayed. Liver Function Tests: No results for input(s): AST, ALT, ALKPHOS, BILITOT, PROT, ALBUMIN in the last 168 hours. No  results for input(s): LIPASE, AMYLASE in the last 168 hours. No results for input(s): AMMONIA in the last 168 hours. CBC:  Recent Labs Lab 12/17/15 0125  12/19/15 0459 12/20/15 0334  WBC 8.7  < > 8.0 8.7  NEUTROABS 5.2  --   --   --   HGB 11.9*  < > 12.0* 11.6*  HCT 36.5*  < > 37.1* 35.9*  MCV 91.5  < > 91.8 92.1  PLT 282  < > 315 291  < > = values in this interval not displayed. Cardiac Enzymes:  Recent Labs Lab 12/17/15 0714 12/19/15 0459  TROPONINI 0.63* 1.76*   BNP: Invalid input(s): POCBNP CBG:  Recent Labs Lab 12/18/15 0646 12/19/15 2338  GLUCAP 98 101*    Significant Diagnostic Studies:  Dg Chest 2 View  12/16/2015  CLINICAL DATA:  Chronic arrhythmia and generalized chest pain. Initial encounter. EXAM: CHEST  2 VIEW COMPARISON:  Chest radiograph performed 10/17/2015 FINDINGS: The lungs are well-aerated. Vascular congestion is noted. Mildly increased interstitial markings may reflect mild interstitial edema. There is no evidence of pleural  effusion or pneumothorax. The heart is borderline enlarged. No acute osseous abnormalities are seen. IMPRESSION: Vascular congestion and borderline cardiomegaly. Mildly increased interstitial markings may reflect mild interstitial edema. Electronically Signed   By: [name] M.D.   On: 12/16/2015 19:37   Left Heart Cath and Coronary Angiography 12/18/15    Conclusion     Acute Mrg lesion, 100% stenosed.  RPDA-2 lesion, 60% stenosed.  RPDA-1 lesion, 40% stenosed.  Unable to adequately engage the left coronary artery. Left main coronary looks relatively stable. There does appear to be a potential lesion in the proximal LAD at a site  Mild-moderately elevated LVEDP of 18-20 mmHg.   The patient has very tortuous peripheral arteries including the right axillary and distal aortic-iliac arteries as well as dilated aortic root making catheterization from either access extremely difficult. I was unable to engage the left  coronary artery with multiple different catheters.     Left heart cath on 12/19/15  Conclusion     Acute Mrg lesion, 100% stenosed.  RPDA-2 lesion, 60% stenosed.  RPDA-1 lesion, 40% stenosed.  Mid Cx lesion, 30% stenosed.  Prox LAD lesion, 30% stenosed.  1. 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     Disposition and Follow-up: Discharge Instructions    Diet - low sodium heart healthy    Complete by:  As directed      Increase activity slowly    Complete by:  As directed             DISPOSITION:  Home    DISCHARGE FOLLOW-UP Follow-up Information    Follow up with Minus Breeding, MD. Schedule an appointment as soon as possible for a visit in 2 weeks.   Specialty:  Cardiology   Why:  for hospital follow-up   Contact information:   [address] [place] [address] [phone]       Follow up with Vicenta Aly, [place]. Schedule an appointment as soon as possible for a visit in 2 weeks.   Specialty:  Nurse Practitioner   Why:  for hospital follow-up   Contact information:   [place] [address] [phone]        Time spent on Discharge: 68mns   Signed:   Cherell Colvin M.D. [facility] 12/20/2015, 10:41 AM Pager: [phone]

## 2015-12-20 NOTE — Progress Notes (Signed)
Pt in stable condition, went over dc instructions wt pt, discount cards on rivaroxaban given to pt, pt verbalised understanding, paper prescriptions given to pt , iv taken out cardiac monitor dc, pt taken off the unit on a wheelchair

## 2015-12-20 NOTE — Progress Notes (Signed)
    Subjective:  No CP or dyspnea. Eager to go home.   Objective:  Vital Signs in the last 24 hours: Temp:  [97.7 F (36.5 C)-98.5 F (36.9 C)] 97.7 F (36.5 C) (04/19 1037) Pulse Rate:  [0-115] 81 (04/19 1037) Resp:  [0-45] 18 (04/19 1037) BP: (120-191)/(73-115) 125/78 mmHg (04/19 1037) SpO2:  [0 %-100 %] 97 % (04/19 1037)  Intake/Output from previous day: 04/18 0701 - 04/19 0700 In: 600 [P.O.:600] Out: 450 [Urine:450]  Physical Exam: Pt is alert and oriented, NAD HEENT: normal Neck: JVP - normal Lungs: CTA bilaterally CV: RRR without murmur  Abd: soft, NT, Positive BS, no hepatomegaly Ext: no C/C/E, distal pulses intact and equal Skin: warm/dry no rash   Lab Results:  Recent Labs  12/19/15 0459 12/20/15 0334  WBC 8.0 8.7  HGB 12.0* 11.6*  PLT 315 291    Recent Labs  12/19/15 0459 12/20/15 0334  NA 141 140  K 3.2* 3.7  CL 104 106  CO2 26 25  GLUCOSE 113* 104*  BUN 8 9  CREATININE 0.94 1.02    Recent Labs  12/19/15 0459  TROPONINI 1.76*    Tele: Sinus rhythm, no further AF  Assessment/Plan:  Paroxysmal, symptomatic atrial fibrillation: started on Xarelto. Rhythm stable on metoprolol. D/C home today. Will arrange FU with Dr Harrington Challenger.  NSTEMI: cath results were good with widely patent coronaries and small vessel disease. Likely demand ischemia with rapid atrial fibrillation.   Sherren Mocha, M.D. 12/20/2015, 11:58 AM

## 2015-12-26 ENCOUNTER — Emergency Department (HOSPITAL_COMMUNITY): Payer: Medicare Other

## 2015-12-26 ENCOUNTER — Emergency Department (HOSPITAL_COMMUNITY)
Admission: EM | Admit: 2015-12-26 | Discharge: 2015-12-26 | Disposition: A | Discharge status: Home / Self Care | Payer: Medicare Other | Attending: Emergency Medicine | Admitting: Emergency Medicine

## 2015-12-26 ENCOUNTER — Encounter (HOSPITAL_COMMUNITY): Payer: Self-pay | Admitting: *Deleted

## 2015-12-26 DIAGNOSIS — Z85528 Personal history of other malignant neoplasm of kidney: Secondary | ICD-10-CM | POA: Diagnosis not present

## 2015-12-26 DIAGNOSIS — R079 Chest pain, unspecified: Secondary | ICD-10-CM | POA: Diagnosis not present

## 2015-12-26 DIAGNOSIS — Z8701 Personal history of pneumonia (recurrent): Secondary | ICD-10-CM | POA: Insufficient documentation

## 2015-12-26 DIAGNOSIS — I480 Paroxysmal atrial fibrillation: Secondary | ICD-10-CM | POA: Diagnosis not present

## 2015-12-26 DIAGNOSIS — Z87891 Personal history of nicotine dependence: Secondary | ICD-10-CM | POA: Insufficient documentation

## 2015-12-26 DIAGNOSIS — Z8669 Personal history of other diseases of the nervous system and sense organs: Secondary | ICD-10-CM | POA: Insufficient documentation

## 2015-12-26 DIAGNOSIS — Z79899 Other long term (current) drug therapy: Secondary | ICD-10-CM | POA: Insufficient documentation

## 2015-12-26 DIAGNOSIS — Z8659 Personal history of other mental and behavioral disorders: Secondary | ICD-10-CM | POA: Insufficient documentation

## 2015-12-26 DIAGNOSIS — E119 Type 2 diabetes mellitus without complications: Secondary | ICD-10-CM | POA: Diagnosis not present

## 2015-12-26 DIAGNOSIS — Z8546 Personal history of malignant neoplasm of prostate: Secondary | ICD-10-CM | POA: Diagnosis not present

## 2015-12-26 DIAGNOSIS — R0602 Shortness of breath: Secondary | ICD-10-CM | POA: Diagnosis present

## 2015-12-26 DIAGNOSIS — R630 Anorexia: Secondary | ICD-10-CM | POA: Diagnosis not present

## 2015-12-26 DIAGNOSIS — I10 Essential (primary) hypertension: Secondary | ICD-10-CM | POA: Diagnosis not present

## 2015-12-26 DIAGNOSIS — Z9889 Other specified postprocedural states: Secondary | ICD-10-CM | POA: Insufficient documentation

## 2015-12-26 DIAGNOSIS — R0600 Dyspnea, unspecified: Secondary | ICD-10-CM

## 2015-12-26 DIAGNOSIS — E785 Hyperlipidemia, unspecified: Secondary | ICD-10-CM | POA: Diagnosis not present

## 2015-12-26 DIAGNOSIS — I2510 Atherosclerotic heart disease of native coronary artery without angina pectoris: Secondary | ICD-10-CM | POA: Diagnosis not present

## 2015-12-26 DIAGNOSIS — I509 Heart failure, unspecified: Secondary | ICD-10-CM | POA: Diagnosis not present

## 2015-12-26 DIAGNOSIS — Z880 Allergy status to penicillin: Secondary | ICD-10-CM | POA: Insufficient documentation

## 2015-12-26 DIAGNOSIS — J441 Chronic obstructive pulmonary disease with (acute) exacerbation: Secondary | ICD-10-CM | POA: Diagnosis not present

## 2015-12-26 LAB — COMPREHENSIVE METABOLIC PANEL
ALBUMIN: 3.2 g/dL — AB (ref 3.5–5.0)
ALK PHOS: 71 U/L (ref 38–126)
ALT: 20 U/L (ref 17–63)
AST: 22 U/L (ref 15–41)
Anion gap: 11 (ref 5–15)
BUN: 14 mg/dL (ref 6–20)
CALCIUM: 8.9 mg/dL (ref 8.9–10.3)
CHLORIDE: 99 mmol/L — AB (ref 101–111)
CO2: 27 mmol/L (ref 22–32)
CREATININE: 0.98 mg/dL (ref 0.61–1.24)
GFR calc non Af Amer: >60 mL/min (ref >60)
GLUCOSE: 102 mg/dL — AB (ref 65–99)
Potassium: 3.5 mmol/L (ref 3.5–5.1)
SODIUM: 137 mmol/L (ref 135–145)
Total Bilirubin: 0.8 mg/dL (ref 0.3–1.2)
Total Protein: 7.2 g/dL (ref 6.5–8.1)

## 2015-12-26 LAB — CBC
HEMATOCRIT: 37.7 % — AB (ref 39.0–52.0)
HEMOGLOBIN: 12.1 g/dL — AB (ref 13.0–17.0)
MCH: 29.7 pg (ref 26.0–34.0)
MCHC: 32.1 g/dL (ref 30.0–36.0)
MCV: 92.6 fL (ref 78.0–100.0)
Platelets: 350 10*3/uL (ref 150–400)
RBC: 4.07 MIL/uL — ABNORMAL LOW (ref 4.22–5.81)
RDW: 13 % (ref 11.5–15.5)
WBC: 14.6 10*3/uL — AB (ref 4.0–10.5)

## 2015-12-26 LAB — D-DIMER, QUANTITATIVE: D-Dimer, Quant: 0.81 ug/mL-FEU — ABNORMAL HIGH (ref 0.00–0.50)

## 2015-12-26 LAB — I-STAT TROPONIN, ED: Troponin i, poc: 0.04 ng/mL (ref 0.00–0.08)

## 2015-12-26 LAB — BRAIN NATRIURETIC PEPTIDE: B Natriuretic Peptide: 79.7 pg/mL (ref 0.0–100.0)

## 2015-12-26 MED ORDER — NITROGLYCERIN 2 % TD OINT
1.0000 [in_us] | TOPICAL_OINTMENT | Freq: Once | TRANSDERMAL | Status: AC
  Administered 2015-12-26: 1 [in_us] via TOPICAL
  Filled 2015-12-26: qty 1

## 2015-12-26 MED ORDER — FUROSEMIDE 10 MG/ML IJ SOLN
60.0000 mg | Freq: Once | INTRAMUSCULAR | Status: AC
  Administered 2015-12-26: 60 mg via INTRAVENOUS
  Filled 2015-12-26: qty 6

## 2015-12-26 NOTE — ED Notes (Signed)
Pt arrives via ems with c/o shortness of breath and his heart skipping a beat. Sob all day, has used inhaler without relief. Iv established, vss en route

## 2015-12-26 NOTE — ED Provider Notes (Signed)
CSN: [id number]     Arrival date & time 12/26/15  0109 History  By signing my name below, I, Altamease Oiler, attest that this documentation has been prepared under the direction and in the presence of Rolland Porter, MD at 0142 AM. Electronically Signed: Altamease Oiler, ED Scribe. 12/26/2015. 1:56 AM   Chief Complaint  Patient presents with  . Shortness of Breath   The history is provided by the patient. No language interpreter was used.   Brought in by EMS from home, [name] [name] is a 70 y.o. male with history of COPD, CHF, CAD, a-fib, HTN, HLD, and DM who presents to the Emergency Department complaining of ongoing SOB with onset at least 10 days ago. Laying flat exacerbates his breathing.  He was discharged from the hospital 6 days ago after being treated for atypical chest pain and a-fib. He states that his symptoms returned 1 day after being discharged and worsened yesterday.  He is not on home oxygen and his home inhaler has provided insufficient relief in SOB. He is concerned that his potassium is low. Associated symptoms include pressure-like chest pain for the last 24 hours, irregular palpitations, SOB, cough, and decreased appetite. Pt denies fever, sweating, nausea, LE swelling. He sees Dr. Owens Shark for cardiologist and Dr. Ouida Sills for primary care. He denies smoking and alcohol use. The pt lives alone.    Patient states he has a home health nurse to come help him every day.  PCP Dr Cherylann Banas Cardiology Dr Percival Spanish  Past Medical History  Diagnosis Date  . Syncope   . Hypertension   . COPD (chronic obstructive pulmonary disease) ([place])   . Hyperlipidemia   . Anxiety   . CHF (congestive heart failure) ([place])   . Childhood asthma   . Aortic root dilatation ([place])     Archie Endo 10/17/2015  . Pneumonia     "2-3 times" (10/17/2015)  . Aortic insufficiency     Archie Endo 10/17/2015  . Sleep apnea     "don't use mask since I lost weight" (10/17/2015)  . Diabetes mellitus type II     diet  controlled  . Renal cell carcinoma ([place])     I do not see confirmation of this diagnosis.  He sees a urologist currently.    . Prostate cancer [facility])     "doctor saw a spot on my prostate"   . Paroxysmal atrial fibrillation ([place]) 12/2015    a. started on Xarelto  . CAD (coronary artery disease)     a. NSTEMI: 12/2015 w/ cath showing diffuse CAD of 30-60% and 100% stenois of acute Mrg --> Medical Management recommended   Past Surgical History  Procedure Laterality Date  . Lumbar disc surgery  1974    "replaced a disc"  . Back surgery    . Cardiac catheterization N/A 12/18/2015    Procedure: Left Heart Cath and Coronary Angiography;  Surgeon: Leonie Man, MD;  Location: [facility];  Service: Cardiovascular;  Laterality: N/A;   Family History  Problem Relation Age of Onset  . Heart disease Mother     No details.  Still alive age 109  . Emphysema Father   . Asthma Father    Social History  Substance Use Topics  . Smoking status: Former Smoker    Types: Cigars  . Smokeless tobacco: Never Used     Comment: "quit smoking in the 1970s"  . Alcohol Use: Yes     Comment: "stopped drinking alcohol in ~  1980; just drank a little on the weekends when I did drink"  lives alone  Review of Systems  Constitutional: Positive for appetite change. Negative for fever, chills and diaphoresis.  Respiratory: Positive for cough and shortness of breath.   Cardiovascular: Positive for chest pain and palpitations. Negative for leg swelling.  Gastrointestinal: Negative for nausea and vomiting.  All other systems reviewed and are negative.     Allergies  Amoxicillin and Penicillins  Home Medications   Prior to Admission medications   Medication Sig Start Date End Date Taking? Authorizing Provider  albuterol (PROVENTIL HFA;VENTOLIN HFA) 108 (90 Base) MCG/ACT inhaler Inhale 2 puffs into the lungs every 6 (six) hours as needed for wheezing or shortness of breath. 10/18/15  Yes Alexa Angela Burke, MD  albuterol (PROVENTIL) (5 MG/ML) 0.5% nebulizer solution Take 0.5 mLs (2.5 mg total) by nebulization every 6 (six) hours as needed for wheezing or shortness of breath. 10/18/15  Yes Alexa Angela Burke, MD  amLODipine (NORVASC) 10 MG tablet Take 10 mg by mouth 2 (two) times daily.  12/25/11  Yes Historical Provider, MD  atorvastatin (LIPITOR) 20 MG tablet Take 1 tablet (20 mg total) by mouth at bedtime. 12/20/15  Yes Ripudeep K Rai, MD  KLOR-CON M20 20 MEQ tablet Take 20 mEq by mouth daily. 12/13/15  Yes Historical Provider, MD  metoprolol tartrate (LOPRESSOR) 25 MG tablet Take 1 tablet (25 mg total) by mouth 2 (two) times daily. 12/20/15  Yes Ripudeep Krystal Eaton, MD  tamsulosin (FLOMAX) 0.4 MG CAPS capsule Take 0.8 mg by mouth daily as needed (FOR PROSTATE).    Yes Historical Provider, MD  rivaroxaban (XARELTO) 20 MG TABS tablet Take 1 tablet (20 mg total) by mouth daily with supper. 12/20/15   Ripudeep K Rai, MD   BP 164/96 mmHg  Temp(Src) 98.1 F (36.7 C) (Oral)  Resp 24  Ht 5\' 9"  (1.753 m)  Wt 241 lb 3.2 oz (109.408 kg)  BMI 35.60 kg/m2  SpO2 90%  Vital signs normal except for borderline hypoxia  Physical Exam  Constitutional: He is oriented to person, place, and time. He appears well-developed and well-nourished.  Non-toxic appearance. He does not appear ill. No distress.  HENT:  Head: Normocephalic and atraumatic.  Right Ear: External ear normal.  Left Ear: External ear normal.  Nose: Nose normal. No mucosal edema or rhinorrhea.  Mouth/Throat: Oropharynx is clear and moist and mucous membranes are normal. No dental abscesses or uvula swelling.  Eyes: Conjunctivae and EOM are normal. Pupils are equal, round, and reactive to light.  Proptotic eyes (states runs in his fathers family, no hx of thyroid disease)  Neck: Normal range of motion and full passive range of motion without pain. Neck supple.  Cardiovascular: Normal rate, regular rhythm and normal heart sounds.  Exam reveals no gallop  and no friction rub.   No murmur heard. Pulmonary/Chest: Effort normal and breath sounds normal. No respiratory distress. He has no wheezes. He has no rhonchi. He has no rales. He exhibits no tenderness and no crepitus.  Abdominal: Soft. Normal appearance and bowel sounds are normal. He exhibits no distension. There is no tenderness. There is no rebound and no guarding.  Musculoskeletal: Normal range of motion. He exhibits no edema or tenderness.  Moves all extremities well.   Neurological: He is alert and oriented to person, place, and time. He has normal strength. No cranial nerve deficit.  Skin: Skin is warm, dry and intact. No rash noted. No erythema. No  pallor.  Psychiatric: He has a normal mood and affect. His speech is normal and behavior is normal. His mood appears not anxious.  Nursing note and vitals reviewed.   ED Course  Procedures (including critical care time)  Medications  nitroGLYCERIN (NITROGLYN) 2 % ointment 1 inch (1 inch Topical Given 12/26/15 0226)  furosemide (LASIX) injection 60 mg (60 mg Intravenous Given 12/26/15 0343)    COORDINATION OF CARE: 1:54 AM Discussed treatment plan which includes lab work, CXR, EKG with pt at bedside and pt agreed to plan. Patient had nitroglycerin paste placed on his chest.  3:39 AM I re-evaluated the patient and provided an update on the results of his lab work and imaging. He states that he feels better.   After reviewing patient's chest x-ray he was given Lasix 60 mg IV.  Patient had good urinary output. He states he's feeling better. Although his BNP is well within normal x-ray was suggestive of some increased fluid. Looking at his medications he is not on a diuretic. I'm going to having follow-up with his cardiologist this week.  Patient had echocardiogram done April 17 which showed ejection fraction of 50-55% with moderate AR, and mild diffuse dilatation of his cardiac chambers. He had a cardiac catheterization done April 18 which  showed normal coronaries. He had a an STEMI and was felt to be demand ischemia with his atrial fibrillation at that point he was in rapid ventricular response.  Labs Review Results for orders placed or performed during the hospital encounter of 12/26/15  CBC  Result Value Ref Range   WBC 14.6 (H) 4.0 - 10.5 K/uL   RBC 4.07 (L) 4.22 - 5.81 MIL/uL   Hemoglobin 12.1 (L) 13.0 - 17.0 g/dL   HCT 37.7 (L) 39.0 - 52.0 %   MCV 92.6 78.0 - 100.0 fL   MCH 29.7 26.0 - 34.0 pg   MCHC 32.1 30.0 - 36.0 g/dL   RDW 13.0 11.5 - 15.5 %   Platelets 350 150 - 400 K/uL  Comprehensive metabolic panel  Result Value Ref Range   Sodium 137 135 - 145 mmol/L   Potassium 3.5 3.5 - 5.1 mmol/L   Chloride 99 (L) 101 - 111 mmol/L   CO2 27 22 - 32 mmol/L   Glucose, Bld 102 (H) 65 - 99 mg/dL   BUN 14 6 - 20 mg/dL   Creatinine, Ser 0.98 0.61 - 1.24 mg/dL   Calcium 8.9 8.9 - 10.3 mg/dL   Total Protein 7.2 6.5 - 8.1 g/dL   Albumin 3.2 (L) 3.5 - 5.0 g/dL   AST 22 15 - 41 U/L   ALT 20 17 - 63 U/L   Alkaline Phosphatase 71 38 - 126 U/L   Total Bilirubin 0.8 0.3 - 1.2 mg/dL   GFR calc non Af Amer >60 >60 mL/min   GFR calc Af Amer >60 >60 mL/min   Anion gap 11 5 - 15  Brain natriuretic peptide  Result Value Ref Range   B Natriuretic Peptide 79.7 0.0 - 100.0 pg/mL  D-dimer, quantitative  Result Value Ref Range   D-Dimer, Quant 0.81 (H) 0.00 - 0.50 ug/mL-FEU  I-stat troponin, ED  Result Value Ref Range   Troponin i, poc 0.04 0.00 - 0.08 ng/mL   Comment 3           Laboratory interpretation all normal except mildly elevated ddimer, mild anemia    Dg Chest 2 View  12/26/2015  CLINICAL DATA:  Acute onset of generalized chest  pain and shortness of breath. Irregular heartbeat. Initial encounter. EXAM: CHEST  2 VIEW COMPARISON:  Chest radiograph performed 12/16/2015 FINDINGS: The lungs are well-aerated. Vascular congestion is noted. Mildly increased interstitial markings may reflect minimal interstitial edema. There  is no evidence of pleural effusion or pneumothorax. The heart is mildly enlarged. No acute osseous abnormalities are seen. IMPRESSION: Vascular congestion and mild cardiomegaly. Mildly increased interstitial markings may reflect minimal interstitial edema. Electronically Signed   By: [name] M.D.   On: 12/26/2015 02:48   Dg Chest 2 View  12/16/2015  CLINICAL DATA:  Chronic arrhythmia and generalized chest pain. Initial encounterIMPRESSION: Vascular congestion and borderline cardiomegaly. Mildly increased interstitial markings may reflect mild interstitial edema. Electronically Signed   By: [name] M.D.   On: 12/16/2015 19:37        I have personally reviewed and evaluated these images and lab results as part of my medical decision-making.   EKG Interpretation   Date/Time:  Tuesday December 26 2015 01:29:40 EDT Ventricular Rate:  72 PR Interval:  176 QRS Duration: 109 QT Interval:  419 QTC Calculation: 458 R Axis:   -49 Text Interpretation:  Sinus rhythm Atrial premature complexes LAD,  consider left anterior fascicular block Nonspecific T abnormalities,  inferior leads No significant change since last tracing 18 Dec 2015  Confirmed by Konya Fauble  MD-I, Yailin Biederman ([id number]) on 12/26/2015 1:34:43 AM      MDM   patient has a history congestive heart failure, chronic atrial fibrillation,  aortic regurgitation presents with some worsening of his chronic shortness of breath. He rapidly improved with a single dose of IV Lasix. His laboratory results do not indicate a need for admission at this time. He can follow-up with his cardiologist.    Final diagnoses:  Dyspnea   Plan discharge  Rolland Porter, MD, Barbette Or, MD 12/26/15 [phone]

## 2015-12-26 NOTE — ED Notes (Signed)
Patient transported to X-ray 

## 2015-12-26 NOTE — Discharge Instructions (Signed)
Call Dr Hochrein's office to get an appointment to be seen soon in the office. Try to restrict the salt in your diet, it can make you retain some excess fluid. Your potassium level tonight was normal.  Shortness of Breath Shortness of breath means you have trouble breathing. Shortness of breath needs medical care right away. HOME CARE   Do not smoke.  Avoid being around chemicals or things (paint fumes, dust) that may bother your breathing.  Rest as needed. Slowly begin your normal activities.  Only take medicines as told by your doctor.  Keep all doctor visits as told. GET HELP RIGHT AWAY IF:   Your shortness of breath gets worse.  You feel lightheaded, pass out (faint), or have a cough that is not helped by medicine.  You cough up blood.  You have pain with breathing.  You have pain in your chest, arms, shoulders, or belly (abdomen).  You have a fever.  You cannot walk up stairs or exercise the way you normally do.  You do not get better in the time expected.  You have a hard time doing normal activities even with rest.  You have problems with your medicines.  You have any new symptoms. MAKE SURE YOU:  Understand these instructions.  Will watch your condition.  Will get help right away if you are not doing well or get worse.   This information is not intended to replace advice given to you by your health care provider. Make sure you discuss any questions you have with your health care provider.   Document Released: 02/05/2008 Document Revised: 08/24/2013 Document Reviewed: 11/04/2011 Elsevier Interactive Patient Education [employer].

## 2016-01-02 ENCOUNTER — Encounter: Payer: Self-pay | Admitting: Nurse Practitioner

## 2016-01-02 ENCOUNTER — Telehealth: Payer: Self-pay

## 2016-01-02 ENCOUNTER — Ambulatory Visit (INDEPENDENT_AMBULATORY_CARE_PROVIDER_SITE_OTHER): Payer: Medicare Other | Admitting: Nurse Practitioner

## 2016-01-02 VITALS — BP 130/82 | HR 60 | Ht 69.0 in | Wt 245.0 lb

## 2016-01-02 DIAGNOSIS — E785 Hyperlipidemia, unspecified: Secondary | ICD-10-CM | POA: Diagnosis not present

## 2016-01-02 DIAGNOSIS — I110 Hypertensive heart disease with heart failure: Secondary | ICD-10-CM

## 2016-01-02 DIAGNOSIS — I119 Hypertensive heart disease without heart failure: Secondary | ICD-10-CM | POA: Insufficient documentation

## 2016-01-02 DIAGNOSIS — I77810 Thoracic aortic ectasia: Secondary | ICD-10-CM

## 2016-01-02 DIAGNOSIS — I5033 Acute on chronic diastolic (congestive) heart failure: Secondary | ICD-10-CM | POA: Diagnosis not present

## 2016-01-02 DIAGNOSIS — I5032 Chronic diastolic (congestive) heart failure: Secondary | ICD-10-CM | POA: Insufficient documentation

## 2016-01-02 DIAGNOSIS — I2510 Atherosclerotic heart disease of native coronary artery without angina pectoris: Secondary | ICD-10-CM | POA: Insufficient documentation

## 2016-01-02 DIAGNOSIS — I480 Paroxysmal atrial fibrillation: Secondary | ICD-10-CM

## 2016-01-02 MED ORDER — KLOR-CON M20 20 MEQ PO TBCR: 20.0000 meq | EXTENDED_RELEASE_TABLET | Freq: Two times a day (BID) | ORAL | Status: DC

## 2016-01-02 MED ORDER — FUROSEMIDE 20 MG PO TABS: 20.0000 mg | ORAL_TABLET | Freq: Every day | ORAL | Status: DC

## 2016-01-02 NOTE — Plan of Care (Signed)
Xarelto approved till 09/01/2016  Preauthorization number [id number]   RAI,RIPUDEEP M.D. [facility] 01/02/2016, 11:40 AM  Pager: [phone]

## 2016-01-02 NOTE — Patient Instructions (Signed)
Your physician has recommended you make the following change in your medication...  1. START furosemide (lasix) 20mg  once daily 2. INCREASE potassium to 15mEq twice daily  Your physician recommends that you return for lab work in: [place] recommends that you schedule a follow-up appointment in: [place] with either Dr. Percival Spanish or NP/PA  Please schedule your cardiac MRI

## 2016-01-02 NOTE — Progress Notes (Signed)
Office Visit    Patient Name: [name] [name] Date of Encounter: 01/02/2016  Primary Care Provider:  Vicenta Aly, [place] Primary Cardiologist:  Lenna Sciara. Hochrein, MD   Chief Complaint    70 year old male with a history of hypertension and dilated aortic root who presents for follow-up after recent hospitalization.  Past Medical History    Past Medical History  Diagnosis Date  . Syncope   . Hypertensive heart disease   . COPD (chronic obstructive pulmonary disease) ([place])   . Hyperlipidemia   . Anxiety   . Chronic diastolic CHF (congestive heart failure) ([place])     a. 12/2015 Echo: EF 50-55%, mod AI, mod Ao root dil, mild MR, mod dil LA, mod RA.  [name] Childhood asthma   . Aortic root dilatation ([place])     a. 12/2015 Echo: mod dil of sinus of valsalva and Ao root (29mm).  . Pneumonia     "2-3 times" (10/17/2015)  . Moderate aortic insufficiency     a. 12/2015 Echo: Mod AI.  [name] Sleep apnea     "don't use mask since I lost weight" (10/17/2015)  . Diabetes mellitus type II     diet controlled  . Renal cell carcinoma ([place])     I do not see confirmation of this diagnosis.  He sees a urologist currently.    . Prostate cancer [facility])     "doctor saw a spot on my prostate"   . Paroxysmal atrial fibrillation ([place])     a. 12/2015 started on Xarelto (CHA2DS2VASc = 4-5).  [name] CAD (coronary artery disease)     a. 12/2015 NSTEMI/Cath (in setting of PAF):  LM nl, LAD 30p,  LCX 12m, RCA  ok, AM 100%, RPDA1 40, RPDA 2 60 ->Med Rx.   Past Surgical History  Procedure Laterality Date  . Lumbar disc surgery  1974    "replaced a disc"  . Back surgery    . Cardiac catheterization N/A 12/18/2015    Procedure: Left Heart Cath and Coronary Angiography;  Surgeon: Leonie Man, MD;  Location: [facility];  Service: Cardiovascular;  Laterality: N/A;    Allergies  Allergies  Allergen Reactions  . Amoxicillin Other (See Comments)    Can't move Has patient had a PCN reaction causing immediate rash,  facial/tongue/throat swelling, SOB or lightheadedness with hypotension: No Has patient had a PCN reaction causing severe rash involving mucus membranes or skin necrosis: No Has patient had a PCN reaction that required hospitalization No Has patient had a PCN reaction occurring within the last 10 years: No If all of the above answers are "NO", then may proceed with Cephalosporin use.   [name] Penicillins Other (See Comments)    Can't move/dizziness Has patient had a PCN reaction causing immediate rash, facial/tongue/throat swelling, SOB or lightheadedness with hypotension: No Has patient had a PCN reaction causing severe rash involving mucus membranes or skin necrosis: No Has patient had a PCN reaction that required hospitalization No Has patient had a PCN reaction occurring within the last 10 years: No If all of the above answers are "NO", then may proceed with Cephalosporin use.     History of Present Illness    70 year old male with the above complex past medical history which includes hypertension, diastolic heart failure, dilated aortic root, and diet-controlled diabetes mellitus. He was recently admitted to [facility] with dyspnea on exertion, palpitations, and chest pressure and had troponin elevation to 0.63. He was evaluated by our team and upon  review, was noted to have paroxysmal atrial fibrillation. Echocardiogram showed normal LV function with dilated aortic root of 46 mm.  Cardiac MRI was recommended to better evaluate the sinus of Valsalva. He underwent diagnostic catheterization revealing an occluded acute marginal branch of the right coronary artery and otherwise nonobstructive disease. Imaging of the left coronary tree was difficult secondary to poor engagement of catheters in the setting of dilated aortic root. Medical therapy was recommended and he was discharged home on Xarelto with plan for follow-up. About a week after discharge, who presented to the emergency department secondary  to complaints of dyspnea, especially occurring at night when he lies down. His chest x-ray showed vascular congestion and he was treated with 1 dose of IV Lasix with symptomatic improvement. He was subsequently discharged.  Since his emergency department visit on April 25, he continues to experience some degree of orthopnea at night. He has chronic bilateral lower extremity edema, which she says has been stable. He also says that his weight has been stable around 241, when he checks it at home. He has not been having any chest pain but does experience dyspnea on exertion. He denies palpitations, change in abdominal girth, or early satiety.  Home Medications    Prior to Admission medications   Medication Sig Start Date End Date Taking? Authorizing Provider  albuterol (PROVENTIL HFA;VENTOLIN HFA) 108 (90 Base) MCG/ACT inhaler Inhale 2 puffs into the lungs every 6 (six) hours as needed for wheezing or shortness of breath. 10/18/15  Yes Alexa Angela Burke, MD  albuterol (PROVENTIL) (5 MG/ML) 0.5% nebulizer solution Take 0.5 mLs (2.5 mg total) by nebulization every 6 (six) hours as needed for wheezing or shortness of breath. 10/18/15  Yes Alexa Angela Burke, MD  amLODipine (NORVASC) 10 MG tablet Take 10 mg by mouth 2 (two) times daily.  12/25/11  Yes Historical Provider, MD  atorvastatin (LIPITOR) 20 MG tablet Take 1 tablet (20 mg total) by mouth at bedtime. 12/20/15  Yes Ripudeep K Rai, MD  KLOR-CON M20 20 MEQ tablet Take 1 tablet (20 mEq total) by mouth 2 (two) times daily. 01/02/16  Yes Rogelia Mire, NP  metoprolol tartrate (LOPRESSOR) 25 MG tablet Take 1 tablet (25 mg total) by mouth 2 (two) times daily. 12/20/15  Yes Ripudeep Krystal Eaton, MD  rivaroxaban (XARELTO) 20 MG TABS tablet Take 1 tablet (20 mg total) by mouth daily with supper. 12/20/15  Yes Ripudeep Krystal Eaton, MD  tamsulosin (FLOMAX) 0.4 MG CAPS capsule Take 0.8 mg by mouth daily as needed (FOR PROSTATE).    Yes Historical Provider, MD  furosemide (LASIX) 20 MG  tablet Take 1 tablet (20 mg total) by mouth daily. 01/02/16   Rogelia Mire, NP    Review of Systems    As above, he has been experiencing dyspnea on exertion and orthopnea with mild lower extremity edema, which she says is stable. He has not been having chest pain or palpitations.  He denies dizziness, syncope, or early satiety. All other systems reviewed and are otherwise negative except as noted above.  Physical Exam    VS:  BP 130/82 mmHg  Pulse 60  Ht 5\' 9"  (1.753 m)  Wt 245 lb (111.131 kg)  BMI 36.16 kg/m2 , BMI Body mass index is 36.16 kg/(m^2). Ambulatory oxygen saturation of 93%. GEN: Well nourished, well developed, in no acute distress. HEENT: normal. Neck: Supple, no JVD, carotid bruits, or masses. Cardiac: RRR, 2/6 systolic murmur at the upper sternal border, no rubs,  or gallops. No clubbing, cyanosis, 1+ bilateral lower extremity edema to the midcalf.  Radials/DP/PT 2+ and equal bilaterally.  Respiratory:  Respirations regular and unlabored, bibasilar crackles. GI: Soft, nontender, nondistended, BS + x 4. MS: no deformity or atrophy. Skin: warm and dry, no rash. Neuro:  Strength and sensation are intact. Psych: Normal affect.  Accessory Clinical Findings    ECG - SB, 57, PAC's, LAD, LAFB, inf/antlat TWI  - similar to 10/17/15 ECG.  Assessment & Plan    1.  Acute on chronic diastolic congestive heart failure: Patient was recently hospitalized with dyspnea, chest pain, and palpitations. Following discharge, he presented back to the emergency department with complaints of dyspnea and was found to have vascular congestive x-ray. He was successfully treated one dose of IV Lasix with some tobacco improvement. He was not placed on daily Lasix. Since then, he has continued to have dyspnea on exertion as well as orthopnea. He has mild lower extremity edema, which she says is normal for him. He also has bibasilar crackles on exam. I suspect his mild volume overload is contributed  to his symptoms. His heart rate and blood pressure stable today. I will add Lasix 20 mg daily and have encouraged him to watch his salt intake and to weigh himself daily. As he has a history of hypokalemia and requires supplemental potassium at baseline, I will increase that to 20 mEq twice a day. We'll plan on follow-up basic metabolic profile next week in office follow-up within the next 2 weeks.   2. Paroxysmal atrial fibrillation: He is in sinus rhythm today. He has not had any recent palpitations. He remains on beta blocker therapy and in the setting of sinus bradycardia, there is no room for titration. He has not yet started Xarelto as he continues to await prior authorization and we will work on this from our side.  3. Hypertensive heart disease: Blood pressure stable today. Continue calcium channel blocker and beta blocker therapy.  4. Dilated aortic root: He has previously been ordered a cardiac MRI that has yet to have this done. Follow-up echo during hospitalization in April again showed a 46 mm aortic root with recommendation for follow-up MRI to better assess the sinus of Valsalva. I discussed this again with him today. We will have this scheduled.  5. Coronary artery disease: He has not been having any chest pain. Recent catheterization revealed total occlusion of the acute marginal with otherwise nonobstructive disease. He remains on beta blocker and statin therapy. He is not on aspirin as he is supposed to be on Xarelto and he does plan to start that once it is cleared through prior authorization.  6. Hyperlipidemia: LDL was 118 in April. He had normal LFTs at that time. Continue Lipitor therapy.  7. Disposition: Follow-up basic metabolic panel next week in office follow-up in 2 weeks.   Murray Hodgkins, NP 01/02/2016, 12:38 PM

## 2016-01-02 NOTE — Telephone Encounter (Signed)
Prior auth for Xarelto 20mg submitted to [facility]. 

## 2016-01-03 ENCOUNTER — Telehealth: Payer: Self-pay | Admitting: Cardiology

## 2016-01-03 ENCOUNTER — Ambulatory Visit: Payer: Medicare Other | Admitting: Nurse Practitioner

## 2016-01-03 ENCOUNTER — Encounter: Payer: Self-pay | Admitting: Cardiology

## 2016-01-03 NOTE — Telephone Encounter (Signed)
Called patient and gave him the date, time and location of cardiac MRI.  Letter mailed today.

## 2016-01-04 ENCOUNTER — Telehealth: Payer: Self-pay

## 2016-01-04 NOTE — Telephone Encounter (Signed)
Xarelto 20mg  approved through 09/01/2016. [place] [id number].

## 2016-01-22 ENCOUNTER — Ambulatory Visit (HOSPITAL_COMMUNITY): Payer: Medicare Other | Attending: Cardiology

## 2016-01-22 ENCOUNTER — Ambulatory Visit (HOSPITAL_COMMUNITY): Admission: RE | Admit: 2016-01-22 | Payer: Medicare Other | Source: Ambulatory Visit

## 2016-01-26 ENCOUNTER — Encounter: Payer: Self-pay | Admitting: Physician Assistant

## 2016-01-26 ENCOUNTER — Ambulatory Visit (INDEPENDENT_AMBULATORY_CARE_PROVIDER_SITE_OTHER): Payer: Medicare Other | Admitting: Physician Assistant

## 2016-01-26 VITALS — BP 100/72 | HR 56 | Ht 69.0 in | Wt 241.0 lb

## 2016-01-26 DIAGNOSIS — I2510 Atherosclerotic heart disease of native coronary artery without angina pectoris: Secondary | ICD-10-CM

## 2016-01-26 DIAGNOSIS — I10 Essential (primary) hypertension: Secondary | ICD-10-CM

## 2016-01-26 DIAGNOSIS — I480 Paroxysmal atrial fibrillation: Secondary | ICD-10-CM

## 2016-01-26 DIAGNOSIS — I5032 Chronic diastolic (congestive) heart failure: Secondary | ICD-10-CM

## 2016-01-26 DIAGNOSIS — E785 Hyperlipidemia, unspecified: Secondary | ICD-10-CM

## 2016-01-26 DIAGNOSIS — I2583 Coronary atherosclerosis due to lipid rich plaque: Secondary | ICD-10-CM

## 2016-01-26 NOTE — Patient Instructions (Signed)
Your physician recommends that you schedule a follow-up appointment in: [place] with Dr. Percival Spanish  Your physician recommends that you continue on your current medications as directed. Please refer to the Current Medication list given to you today.  Please schedule your cardiac MRI  Please have lab work TODAY - first floor - [address]

## 2016-01-26 NOTE — Progress Notes (Signed)
Patient ID: [name] [name], male   DOB: [birth year], 70 y.o.   MRN: [id number]    Date:  01/26/2016   ID:  [name] [name], DOB 04/05/1946, MRN [id number]  PCP:  Vicenta Aly, FNP  Primary Cardiologist:  [facility]   Chief Complaint  Patient presents with  . Follow-up    leg swelling, some shortness of breath,      History of Present Illness: [name] [name] is a 70 y.o. male  With a past medical history which includes hypertension, diastolic heart failure, dilated aortic root, and diet-controlled diabetes mellitus. He was recently admitted to [facility] with dyspnea on exertion, palpitations, and chest pressure and had troponin elevation to 0.63. He was noted to have paroxysmal atrial fibrillation. Echocardiogram showed normal LV function with dilated aortic root of 46 mm. Cardiac MRI was recommended to better evaluate the sinus of Valsalva. He underwent diagnostic catheterization revealing an occluded acute marginal branch of the right coronary artery and otherwise nonobstructive disease. Imaging of the left coronary tree was difficult secondary to poor engagement of catheters in the setting of dilated aortic root. Medical therapy was recommended and he was discharged home on Xarelto with plan for follow-up. About a week after discharge, who presented to the emergency department secondary to complaints of dyspnea, especially occurring at night when he lies down. His chest x-ray showed vascular congestion and he was treated with 1 dose of IV Lasix with symptomatic improvement. He was subsequently discharged.  He was seen by Elvin So NP on May 2 and at that time he continued to experience some degree of orthopnea at night. He has chronic bilateral lower extremity edema, which he says has been stable. He also reported that his weight was stable around 241. He was started on 20 mg of Lasix daily and instructed get labs prior to next visit.    Patient presents for two-week evaluation. He reports his  breathing is much better since taking the Lasix. He was a little confused about taking it with the Flomax and he stopped the Flomax. He does report being sleepy during the day because he is not sleeping well at night.  He denies any chest pain. There was some confusion with the scheduling of his cardiac MRI that is why he missed it.    The patient currently denies nausea, vomiting, fever, chest pain, shortness of breath, orthopnea, dizziness, PND, cough, congestion, abdominal pain, hematochezia, melena, lower extremity edema, claudication.  Wt Readings from Last 3 Encounters:  01/26/16 241 lb (109.317 kg)  01/02/16 245 lb (111.131 kg)  12/26/15 241 lb 3.2 oz (109.408 kg)     Past Medical History  Diagnosis Date  . Syncope   . Hypertensive heart disease   . COPD (chronic obstructive pulmonary disease) ([place])   . Hyperlipidemia   . Anxiety   . Chronic diastolic CHF (congestive heart failure) ([place])     a. 12/2015 Echo: EF 50-55%, mod AI, mod Ao root dil, mild MR, mod dil LA, mod RA.  [name] Childhood asthma   . Aortic root dilatation ([place])     a. 12/2015 Echo: mod dil of sinus of valsalva and Ao root (60mm).  . Pneumonia     "2-3 times" (10/17/2015)  . Moderate aortic insufficiency     a. 12/2015 Echo: Mod AI.  [name] Sleep apnea     "don't use mask since I lost weight" (10/17/2015)  . Diabetes mellitus type II     diet controlled  . Renal cell  carcinoma ([place])     I do not see confirmation of this diagnosis.  He sees a urologist currently.    . Prostate cancer [facility])     "doctor saw a spot on my prostate"   . Paroxysmal atrial fibrillation ([place])     a. 12/2015 started on Xarelto (CHA2DS2VASc = 4-5).  [name] CAD (coronary artery disease)     a. 12/2015 NSTEMI/Cath (in setting of PAF):  LM nl, LAD 30p,  LCX 15m, RCA  ok, AM 100%, RPDA1 40, RPDA 2 60 ->Med Rx.    Current Outpatient Prescriptions  Medication Sig Dispense Refill  . albuterol (PROVENTIL HFA;VENTOLIN HFA) 108 (90 Base) MCG/ACT inhaler Inhale  2 puffs into the lungs every 6 (six) hours as needed for wheezing or shortness of breath. 1 Inhaler 3  . albuterol (PROVENTIL) (5 MG/ML) 0.5% nebulizer solution Take 0.5 mLs (2.5 mg total) by nebulization every 6 (six) hours as needed for wheezing or shortness of breath. 20 mL 12  . amLODipine (NORVASC) 10 MG tablet Take 10 mg by mouth 2 (two) times daily.     [name] atorvastatin (LIPITOR) 20 MG tablet Take 1 tablet (20 mg total) by mouth at bedtime. 20 tablet 3  . cetirizine (ZYRTEC) 10 MG tablet TAKE 1 TABLET EVERY DAY AS NEEDED  5  . furosemide (LASIX) 20 MG tablet Take 1 tablet (20 mg total) by mouth daily. 30 tablet 6  . KLOR-CON M20 20 MEQ tablet Take 1 tablet (20 mEq total) by mouth 2 (two) times daily. 60 tablet 6  . metoprolol tartrate (LOPRESSOR) 25 MG tablet Take 1 tablet (25 mg total) by mouth 2 (two) times daily. 60 tablet 3  . montelukast (SINGULAIR) 10 MG tablet Take 10 mg by mouth at bedtime.  5  . rivaroxaban (XARELTO) 20 MG TABS tablet Take 1 tablet (20 mg total) by mouth daily with supper. 30 tablet 3   No current facility-administered medications for this visit.    Allergies:    Allergies  Allergen Reactions  . Amoxicillin Other (See Comments)    Can't move Has patient had a PCN reaction causing immediate rash, facial/tongue/throat swelling, SOB or lightheadedness with hypotension: No Has patient had a PCN reaction causing severe rash involving mucus membranes or skin necrosis: No Has patient had a PCN reaction that required hospitalization No Has patient had a PCN reaction occurring within the last 10 years: No If all of the above answers are "NO", then may proceed with Cephalosporin use.   [name] Penicillins Other (See Comments)    Can't move/dizziness Has patient had a PCN reaction causing immediate rash, facial/tongue/throat swelling, SOB or lightheadedness with hypotension: No Has patient had a PCN reaction causing severe rash involving mucus membranes or skin necrosis:  No Has patient had a PCN reaction that required hospitalization No Has patient had a PCN reaction occurring within the last 10 years: No If all of the above answers are "NO", then may proceed with Cephalosporin use.     Social History:  The patient  reports that he has quit smoking. His smoking use included Cigars. He has never used smokeless tobacco. He reports that he drinks alcohol. He reports that he does not use illicit drugs.   Family history:   Family History  Problem Relation Age of Onset  . Heart disease Mother     No details.  Still alive age 15  . Emphysema Father   . Asthma Father     ROS:  Please see  the history of present illness.  All other systems reviewed and negative.   PHYSICAL EXAM: VS:  BP 100/72 mmHg  Pulse 56  Ht 5\' 9"  (1.753 m)  Wt 241 lb (109.317 kg)  BMI 35.57 kg/m2 Obese, well developed, in no acute distress HEENT: Pupils are equal round react to light accommodation extraocular movements are intact.  Neck: no JVDNo cervical lymphadenopathy. Cardiac: Regularly irregular with a 2/6 right-sided systolic murmur Lungs:  clear to auscultation bilaterally, no wheezing, rhonchi or rales Abd: soft, nontender, positive bowel sounds all quadrants, no hepatosplenomegaly Ext: Trace left greater than right lower extremity edema.  2+ radial and dorsalis pedis pulses. Skin: warm and dry Neuro:  Grossly normal    ASSESSMENT AND PLAN:  Problem List Items Addressed This Visit    Paroxysmal atrial fibrillation (HCC) - Primary (Chronic)   Hyperlipidemia   HLD (hyperlipidemia)   Essential (primary) hypertension   Chronic diastolic CHF (congestive heart failure) (HCC)   CAD (coronary artery disease)     Mr. Holik Appears euvolemic on exam. Continue Lasix at 20 mg daily and check basic metabolic panel today as was previously ordered. Also discussed low sodium diet and continuing to weigh daily. We also discussed getting the cardiac MRI rescheduled.  He is taking  Xarelto for his paroxysmal atrial fibrillation. Currently his rhythm is regularly irregular suspect aresome PVCs.  Continue Lipitor for his cholesterol. Blood pressures well-controlled on Norvasc 10 mg, Lopressor 25 mg twice daily.  Is walking daily and not having any angina.

## 2016-01-27 LAB — BASIC METABOLIC PANEL
BUN: 11 mg/dL (ref 7–25)
CALCIUM: 8.9 mg/dL (ref 8.6–10.3)
CO2: 28 mmol/L (ref 20–31)
Chloride: 101 mmol/L (ref 98–110)
Creat: 0.97 mg/dL (ref 0.70–1.18)
Glucose, Bld: 80 mg/dL (ref 65–99)
Potassium: 3.6 mmol/L (ref 3.5–5.3)
SODIUM: 141 mmol/L (ref 135–146)

## 2016-02-07 ENCOUNTER — Encounter: Payer: Self-pay | Admitting: Cardiology

## 2016-02-07 ENCOUNTER — Telehealth: Payer: Self-pay | Admitting: Cardiology

## 2016-02-07 NOTE — Telephone Encounter (Signed)
Called the patient and gave him date, time and place of MRI.  Also mailed letter and calendar for the test.

## 2016-02-12 ENCOUNTER — Encounter: Payer: Self-pay | Admitting: Cardiology

## 2016-02-16 ENCOUNTER — Other Ambulatory Visit (HOSPITAL_COMMUNITY): Payer: Medicare Other

## 2016-02-16 ENCOUNTER — Inpatient Hospital Stay (HOSPITAL_COMMUNITY): Admission: RE | Admit: 2016-02-16 | Payer: Medicare Other | Source: Ambulatory Visit

## 2016-02-26 ENCOUNTER — Ambulatory Visit (HOSPITAL_COMMUNITY): Admission: RE | Admit: 2016-02-26 | Payer: Medicare Other | Source: Ambulatory Visit

## 2016-02-26 ENCOUNTER — Ambulatory Visit (HOSPITAL_COMMUNITY): Payer: Medicare Other

## 2016-03-20 ENCOUNTER — Ambulatory Visit (HOSPITAL_COMMUNITY): Payer: Medicare Other

## 2016-03-20 ENCOUNTER — Ambulatory Visit (HOSPITAL_COMMUNITY): Admission: RE | Admit: 2016-03-20 | Payer: Medicare Other | Source: Ambulatory Visit

## 2016-03-26 ENCOUNTER — Encounter (HOSPITAL_COMMUNITY): Payer: Self-pay

## 2016-03-26 ENCOUNTER — Ambulatory Visit (HOSPITAL_COMMUNITY)
Admission: RE | Admit: 2016-03-26 | Discharge: 2016-03-26 | Disposition: A | Discharge status: Home / Self Care | Payer: Medicare Other | Source: Ambulatory Visit | Attending: Cardiology | Admitting: Cardiology

## 2016-03-26 DIAGNOSIS — I351 Nonrheumatic aortic (valve) insufficiency: Secondary | ICD-10-CM | POA: Diagnosis present

## 2016-03-26 LAB — CREATININE, SERUM: Creatinine, Ser: 0.93 mg/dL (ref 0.61–1.24)

## 2016-03-28 NOTE — Progress Notes (Signed)
HPI The patient presents for evaluation of aortic root dilatation and aortic insufficiency.   He was in the hospital earlier this year with dyspnea and had a mild troponin elevation.  I reviewed these results.   Cath demonstrated an occluded OM.  He was found to have atrial fib and was started on Xarelto.    He was scheduled to have an MRI for follow up of his aortic root enlargement.  However, he was unable to stay in the MRI machine recently.  He says he is doing relatively well. He thinks he might have sleep apnea as he struggles to breathe sometimes at night. He's also having some cough and sinus congestion. However, otherwise he seems to be doing better. He's doing some walking for exercise. With this level of activity he denies any chest pressure, neck or arm discomfort. He's not having any presyncope or syncope. He did have some heart fluttering that lasts for a few seconds at a time.      Allergies  Allergen Reactions  . Amoxicillin Other (See Comments)    Can't move Has patient had a PCN reaction causing immediate rash, facial/tongue/throat swelling, SOB or lightheadedness with hypotension: No Has patient had a PCN reaction causing severe rash involving mucus membranes or skin necrosis: No Has patient had a PCN reaction that required hospitalization No Has patient had a PCN reaction occurring within the last 10 years: No If all of the above answers are "NO", then may proceed with Cephalosporin use.   [name] Penicillins Other (See Comments)    Can't move/dizziness Has patient had a PCN reaction causing immediate rash, facial/tongue/throat swelling, SOB or lightheadedness with hypotension: No Has patient had a PCN reaction causing severe rash involving mucus membranes or skin necrosis: No Has patient had a PCN reaction that required hospitalization No Has patient had a PCN reaction occurring within the last 10 years: No If all of the above answers are "NO", then may proceed with  Cephalosporin use.     Current Outpatient Prescriptions  Medication Sig Dispense Refill  . albuterol (PROVENTIL HFA;VENTOLIN HFA) 108 (90 Base) MCG/ACT inhaler Inhale 2 puffs into the lungs every 6 (six) hours as needed for wheezing or shortness of breath. 1 Inhaler 3  . albuterol (PROVENTIL) (5 MG/ML) 0.5% nebulizer solution Take 0.5 mLs (2.5 mg total) by nebulization every 6 (six) hours as needed for wheezing or shortness of breath. 20 mL 12  . amLODipine (NORVASC) 10 MG tablet Take 10 mg by mouth 2 (two) times daily.     [name] atorvastatin (LIPITOR) 20 MG tablet Take 1 tablet (20 mg total) by mouth at bedtime. 20 tablet 3  . cetirizine (ZYRTEC) 10 MG tablet TAKE 1 TABLET EVERY DAY AS NEEDED  5  . furosemide (LASIX) 20 MG tablet Take 1 tablet (20 mg total) by mouth daily. 30 tablet 6  . KLOR-CON M20 20 MEQ tablet Take 1 tablet (20 mEq total) by mouth 2 (two) times daily. 60 tablet 6  . metoprolol tartrate (LOPRESSOR) 25 MG tablet Take 1 tablet (25 mg total) by mouth 2 (two) times daily. 60 tablet 3  . montelukast (SINGULAIR) 10 MG tablet Take 10 mg by mouth at bedtime.  5  . rivaroxaban (XARELTO) 20 MG TABS tablet Take 1 tablet (20 mg total) by mouth daily with supper. 30 tablet 3   No current facility-administered medications for this visit.     Past Medical History:  Diagnosis Date  . Anxiety   . Aortic  root dilatation ([place])    a. 12/2015 Echo: mod dil of sinus of valsalva and Ao root (62mm).  [name] CAD (coronary artery disease)    a. 12/2015 NSTEMI/Cath (in setting of PAF):  LM nl, LAD 30p,  LCX 33m, RCA  ok, AM 100%, RPDA1 40, RPDA 2 60 ->Med Rx.  . Childhood asthma   . Chronic diastolic CHF (congestive heart failure) ([place])    a. 12/2015 Echo: EF 50-55%, mod AI, mod Ao root dil, mild MR, mod dil LA, mod RA.  [name] COPD (chronic obstructive pulmonary disease) ([place])   . Diabetes mellitus type II    diet controlled  . Hyperlipidemia   . Hypertensive heart disease   . Moderate aortic  insufficiency    a. 12/2015 Echo: Mod AI.  [name] Paroxysmal atrial fibrillation ([place])    a. 12/2015 started on Xarelto (CHA2DS2VASc = 4-5).  . Pneumonia    "2-3 times" (10/17/2015)  . Prostate cancer [facility])    "doctor saw a spot on my prostate"   . Renal cell carcinoma ([place])    I do not see confirmation of this diagnosis.  He sees a urologist currently.    . Sleep apnea    "don't use mask since I lost weight" (10/17/2015)  . Syncope     Past Surgical History:  Procedure Laterality Date  . BACK SURGERY    . CARDIAC CATHETERIZATION N/A 12/18/2015   Procedure: Left Heart Cath and Coronary Angiography;  Surgeon: Leonie Man, MD;  Location: [facility] [facility];  Service: Cardiovascular;  Laterality: N/A;  . LUMBAR [place]   "replaced a disc"     ROS:  Decreased sleep, allergies.  As stated in the HPI and negative for all other systems.  PHYSICAL EXAM BP 114/68   Pulse (!) 58   Ht 5\' 9"  (1.753 m)   Wt 247 lb (112 kg)   BMI 36.48 kg/m   GENERAL:  Well appearing HEENT:  Pupils equal round and reactive, fundi not visualized, oral mucosa unremarkable, proptosis, poor dentition NECK:  No jugular venous distention, waveform within normal limits, carotid upstroke brisk and symmetric, no bruits, no thyromegaly LUNGS:  Clear CHEST:  Unremarkable HEART:  PMI not displaced or sustained,S1 and S2 within normal limits, no S3, no S4, no clicks, no rubs, diastolic murmur at the left 3rd intercostal space, short systolic murmur ABD:  Flat, positive bowel sounds normal in frequency in pitch, no bruits, no rebound, no guarding, no midline pulsatile mass, no hepatomegaly, no splenomegaly EXT:  2 plus pulses throughout, mild leg edema, no cyanosis, open sores on the lower legs  Lab Results  Component Value Date   CHOL 187 12/19/2015   TRIG 111 12/19/2015   HDL 47 12/19/2015   LDLCALC 118 (H) 12/19/2015    ASSESSMENT AND PLAN  Acute on chronic diastolic congestive heart failure:    He  seems to be better controlled with his volume. I will check a basic metabolic profile today. However, I don't think I need to change his therapy.  Paroxysmal atrial fibrillation: He did start his Xarelto. He's not having any bleeding issues. He might be having some short paroxysms but is tolerating this. No change in therapy is indicated.  Hypertensive heart disease:   His blood pressure is well controlled.  He will continue the meds as listed.  Dilated aortic root: He tried to have his MRI but couldn't stay in the machine. We will try to get this rescheduled an  open MRI.  Coronary artery disease: He has not been having any chest pain. Recent catheterization revealed total occlusion of the acute marginal with otherwise nonobstructive disease. He remains on beta blocker and statin therapy. He will continue on current therapy.   Hyperlipidemia: LDL was 118 in April. He had normal LFTs at that time. Continue Lipitor therapy.  Sleep Apnea:  He has symptoms consistent with this with somnolence and snoring.   I will arrange a sleep study.

## 2016-03-29 ENCOUNTER — Ambulatory Visit (INDEPENDENT_AMBULATORY_CARE_PROVIDER_SITE_OTHER): Payer: Medicare Other | Admitting: Cardiology

## 2016-03-29 ENCOUNTER — Encounter: Payer: Self-pay | Admitting: Cardiology

## 2016-03-29 VITALS — BP 114/68 | HR 58 | Ht 69.0 in | Wt 247.0 lb

## 2016-03-29 DIAGNOSIS — I77810 Thoracic aortic ectasia: Secondary | ICD-10-CM

## 2016-03-29 DIAGNOSIS — I5032 Chronic diastolic (congestive) heart failure: Secondary | ICD-10-CM

## 2016-03-29 DIAGNOSIS — G4730 Sleep apnea, unspecified: Secondary | ICD-10-CM

## 2016-03-29 DIAGNOSIS — Z79899 Other long term (current) drug therapy: Secondary | ICD-10-CM

## 2016-03-29 LAB — BASIC METABOLIC PANEL
BUN: 13 mg/dL (ref 7–25)
CALCIUM: 9 mg/dL (ref 8.6–10.3)
CHLORIDE: 100 mmol/L (ref 98–110)
CO2: 29 mmol/L (ref 20–31)
CREATININE: 1.01 mg/dL (ref 0.70–1.18)
Glucose, Bld: 181 mg/dL — ABNORMAL HIGH (ref 65–99)
Potassium: 3.3 mmol/L — ABNORMAL LOW (ref 3.5–5.3)
SODIUM: 139 mmol/L (ref 135–146)

## 2016-03-29 NOTE — Patient Instructions (Signed)
Medication Instructions:  Continue current medications  Labwork: BMP  Testing/Procedures: Your physician has recommended that you have a sleep study. This test records several body functions during sleep, including: brain activity, eye movement, oxygen and carbon dioxide blood levels, heart rate and rhythm, breathing rate and rhythm, the flow of air through your mouth and nose, snoring, body muscle movements, and chest and belly movement.  Follow-Up: Your physician recommends that you schedule a follow-up appointment in: 4 Months   Any Other Special Instructions Will Be Listed Below (If Applicable).   If you need a refill on your cardiac medications before your next appointment, please call your pharmacy.

## 2016-04-04 ENCOUNTER — Telehealth: Payer: Self-pay | Admitting: *Deleted

## 2016-04-04 DIAGNOSIS — Z79899 Other long term (current) drug therapy: Secondary | ICD-10-CM

## 2016-04-04 NOTE — Telephone Encounter (Signed)
Spoke with pt Son(DPR) about pt blood work and to increase his potassium to an extra pill a day and have blood work done in 2 weeks, pt son voice understanding and lab work was order and mail to pt to get done.

## 2016-04-04 NOTE — Telephone Encounter (Signed)
-----   Message from Minus Breeding, MD sent at 03/31/2016  9:53 AM EDT ----- Please call and ask him to take an extra 20 meq his potassium daily.  Repeat a BMET in 2 weeks.

## 2016-04-22 ENCOUNTER — Ambulatory Visit: Payer: Medicare Other | Admitting: Cardiology

## 2016-04-26 ENCOUNTER — Encounter (HOSPITAL_COMMUNITY): Payer: Self-pay | Admitting: Emergency Medicine

## 2016-04-26 ENCOUNTER — Emergency Department (HOSPITAL_COMMUNITY)
Admission: EM | Admit: 2016-04-26 | Discharge: 2016-04-26 | Disposition: A | Discharge status: Home / Self Care | Payer: Medicare Other | Attending: Emergency Medicine | Admitting: Emergency Medicine

## 2016-04-26 DIAGNOSIS — Z79899 Other long term (current) drug therapy: Secondary | ICD-10-CM | POA: Diagnosis not present

## 2016-04-26 DIAGNOSIS — Z8546 Personal history of malignant neoplasm of prostate: Secondary | ICD-10-CM | POA: Diagnosis not present

## 2016-04-26 DIAGNOSIS — E119 Type 2 diabetes mellitus without complications: Secondary | ICD-10-CM | POA: Insufficient documentation

## 2016-04-26 DIAGNOSIS — Z85528 Personal history of other malignant neoplasm of kidney: Secondary | ICD-10-CM | POA: Insufficient documentation

## 2016-04-26 DIAGNOSIS — J449 Chronic obstructive pulmonary disease, unspecified: Secondary | ICD-10-CM | POA: Diagnosis not present

## 2016-04-26 DIAGNOSIS — I110 Hypertensive heart disease with heart failure: Secondary | ICD-10-CM | POA: Diagnosis not present

## 2016-04-26 DIAGNOSIS — I5032 Chronic diastolic (congestive) heart failure: Secondary | ICD-10-CM | POA: Insufficient documentation

## 2016-04-26 DIAGNOSIS — Z87891 Personal history of nicotine dependence: Secondary | ICD-10-CM | POA: Insufficient documentation

## 2016-04-26 DIAGNOSIS — J45901 Unspecified asthma with (acute) exacerbation: Secondary | ICD-10-CM | POA: Diagnosis not present

## 2016-04-26 DIAGNOSIS — I2510 Atherosclerotic heart disease of native coronary artery without angina pectoris: Secondary | ICD-10-CM | POA: Diagnosis not present

## 2016-04-26 DIAGNOSIS — L03116 Cellulitis of left lower limb: Secondary | ICD-10-CM | POA: Diagnosis not present

## 2016-04-26 DIAGNOSIS — L539 Erythematous condition, unspecified: Secondary | ICD-10-CM | POA: Diagnosis present

## 2016-04-26 LAB — CBC WITH DIFFERENTIAL/PLATELET
BASOS ABS: 0 10*3/uL (ref 0.0–0.1)
Basophils Relative: 0 %
EOS ABS: 0.2 10*3/uL (ref 0.0–0.7)
EOS PCT: 2 %
HCT: 38.6 % — ABNORMAL LOW (ref 39.0–52.0)
Hemoglobin: 12.9 g/dL — ABNORMAL LOW (ref 13.0–17.0)
LYMPHS ABS: 1.7 10*3/uL (ref 0.7–4.0)
LYMPHS PCT: 18 %
MCH: 29.9 pg (ref 26.0–34.0)
MCHC: 33.4 g/dL (ref 30.0–36.0)
MCV: 89.4 fL (ref 78.0–100.0)
MONOS PCT: 13 %
Monocytes Absolute: 1.2 10*3/uL — ABNORMAL HIGH (ref 0.1–1.0)
NEUTROS PCT: 67 %
Neutro Abs: 6.4 10*3/uL (ref 1.7–7.7)
PLATELETS: UNDETERMINED 10*3/uL (ref 150–400)
RBC: 4.32 MIL/uL (ref 4.22–5.81)
RDW: 13.6 % (ref 11.5–15.5)
WBC: 9.4 10*3/uL (ref 4.0–10.5)

## 2016-04-26 LAB — BASIC METABOLIC PANEL
ANION GAP: 5 (ref 5–15)
BUN: 15 mg/dL (ref 6–20)
CO2: 27 mmol/L (ref 22–32)
Calcium: 8.5 mg/dL — ABNORMAL LOW (ref 8.9–10.3)
Chloride: 106 mmol/L (ref 101–111)
Creatinine, Ser: 0.9 mg/dL (ref 0.61–1.24)
GFR calc Af Amer: >60 mL/min (ref >60)
Glucose, Bld: 100 mg/dL — ABNORMAL HIGH (ref 65–99)
POTASSIUM: 3.6 mmol/L (ref 3.5–5.1)
SODIUM: 138 mmol/L (ref 135–145)

## 2016-04-26 MED ORDER — VANCOMYCIN HCL IN DEXTROSE 1-5 GM/200ML-% IV SOLN
1000.0000 mg | Freq: Once | INTRAVENOUS | Status: AC
  Administered 2016-04-26: 1000 mg via INTRAVENOUS
  Filled 2016-04-26: qty 200

## 2016-04-26 MED ORDER — SULFAMETHOXAZOLE-TRIMETHOPRIM 800-160 MG PO TABS: 1.0000 | ORAL_TABLET | Freq: Two times a day (BID) | ORAL | 0 refills | Status: AC

## 2016-04-26 MED ORDER — SODIUM CHLORIDE 0.9 % IV BOLUS (SEPSIS)
500.0000 mL | Freq: Once | INTRAVENOUS | Status: AC
  Administered 2016-04-26: 500 mL via INTRAVENOUS

## 2016-04-26 NOTE — ED Triage Notes (Addendum)
Pt reports L lower leg redness and swelling for the past week. Pt has an old sore on his ankle that he has been picking at.

## 2016-04-26 NOTE — Progress Notes (Signed)
Pt confirms his pcp as [facility] States not seen in 2-4 weeks  Pt needed redirecting during assessment when continued to discuss having staff visit him at home since his wife passed in 2015  When Cm entered Pharmacy tech was coming out of room  Pt ambulates Noted red, swollen left LE with various dark spots Pt states he had not been dx with DM that he knows of.   Pt has a son and a daughter to assist and provided care Reports able to complete all ADLs, drive and take care of himself NO DME needed at this time

## 2016-04-26 NOTE — ED Notes (Signed)
Patient is A & O x4.  He denies pain.

## 2016-04-26 NOTE — Discharge Instructions (Signed)
Antibiotic twice a day for 10 days. Return if infection gets worse.

## 2016-04-30 NOTE — ED Provider Notes (Signed)
[facility] DEPT Provider Note   CSN: [id number] Arrival date & time: 04/26/16  1121     History   Chief Complaint Chief Complaint  Patient presents with  . Leg Swelling  . leg redness    HPI [name] [name] is a 70 y.o. male.  Redness on the left lower extremity for several days. No fever, sweats, chills.  Patient is a type II diabetic. He is ambulatory. Severity symptoms is mild. He has been picking at a sore on his ankle.      Past Medical History:  Diagnosis Date  . Anxiety   . Aortic root dilatation ([place])    a. 12/2015 Echo: mod dil of sinus of valsalva and Ao root (30m).  .Marland KitchenCAD (coronary artery disease)    a. 12/2015 NSTEMI/Cath (in setting of PAF):  LM nl, LAD 30p,  LCX 371mRCA  ok, AM 100%, RPDA1 40, RPDA 2 60 ->Med Rx.  . Childhood asthma   . Chronic diastolic CHF (congestive heart failure) (HCR[place]   a. 12/2015 Echo: EF 50-55%, mod AI, mod Ao root dil, mild MR, mod dil LA, mod RA.  . Marland KitchenOPD (chronic obstructive pulmonary disease) (HCPablo  . Diabetes mellitus type II    diet controlled  . Hyperlipidemia   . Hypertensive heart disease   . Moderate aortic insufficiency    a. 12/2015 Echo: Mod AI.  . Marland Kitchenaroxysmal atrial fibrillation (HCVinita   a. 12/2015 started on Xarelto (CHA2DS2VASc = 4-5).  . Pneumonia    "2-3 times" (10/17/2015)  . [facility]   "doctor saw a spot on my prostate"   . Renal cell carcinoma (HCBen Lomond   I do not see confirmation of this diagnosis.  He sees a urologist currently.    . Sleep apnea    "don't use mask since I lost weight" (10/17/2015)  . Syncope     Patient Active Problem List   Diagnosis Date Noted  . CAD (coronary artery disease)   . Chronic diastolic CHF (congestive heart failure) (HCChoctaw  . Hyperlipidemia   . Hypertensive heart disease   . Paroxysmal atrial fibrillation (HCNew Chicago04/16/2017  . Multiple myeloma (HCJay04/16/2017  . Unstable angina (HCCorsicana04/15/2017  . Asthma exacerbation 10/18/2015  . Mild intermittent asthma  10/18/2015  . Aortic root dilatation (HCBerryville02/14/2017  . Diabetes (HCTallmadge02/14/2017  . 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 Cervical pain 12/09/2014  . Hand paresthesia 12/09/2014  . Renal lesion   . Headache 11/03/2014  . PND (post-nasal drip) 11/03/2014  . Headache, temporal 09/30/2014  . Elevated prostate specific antigen (PSA) 07/12/2014  . Allergic rhinitis 07/12/2014  . LBP (low back pain) 07/12/2014  . Palpitation 07/11/2014  . Awareness of heartbeats 07/11/2014  . Blood glucose elevated 04/05/2014  . Patient's other noncompliance with medication regimen 08/10/2013  . Cellulitis 04/28/2013  . Chronic venous insufficiency 07/23/2012  . Auditory disturbance 02/25/2012  . Stasis, venous 01/30/2012  . Decreased potassium in the blood 06/28/2011  . Athlete's foot 04/11/2011  . Obstructive apnea 04/11/2011  . RAD (reactive airway disease) 04/11/2011  . HLD (hyperlipidemia) 09/09/2010  . Abnormal blood sugar 09/07/2010  . Essential (primary) hypertension 09/07/2010  . Avitaminosis D 07/12/2010  . Hypoglycemia 06/20/2010  . 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 Anxiety, generalized 04/04/2010  . Kidney lump 04/04/2010  . Cardiac conduction disorder 02/27/2010  . Pruritic dermatitis 11/10/2009  . Benign prostatic hypertrophy without urinary obstruction  09/13/2009  . Abnormal findings on examination of genitourinary organs 08/09/2009  . Hereditary and idiopathic neuropathy 07/05/2009  . Excessive urination at night 02/23/2008    Past Surgical History:  Procedure Laterality Date  . BACK SURGERY    . CARDIAC CATHETERIZATION N/A 12/18/2015   Procedure: Left Heart Cath and Coronary Angiography;  Surgeon: Leonie Man, MD;  Location: [facility];  Service: Cardiovascular;  Laterality: N/A;  . LUMBAR [place]   "replaced a disc"       Home Medications    Prior to Admission medications   Medication Sig  Start Date End Date Taking? Authorizing Provider  albuterol (PROVENTIL HFA;VENTOLIN HFA) 108 (90 Base) MCG/ACT inhaler Inhale 2 puffs into the lungs every 6 (six) hours as needed for wheezing or shortness of breath. 10/18/15  Yes Alexa Angela Burke, MD  albuterol (PROVENTIL) (5 MG/ML) 0.5% nebulizer solution Take 0.5 mLs (2.5 mg total) by nebulization every 6 (six) hours as needed for wheezing or shortness of breath. 10/18/15  Yes Alexa Angela Burke, MD  amLODipine (NORVASC) 10 MG tablet Take 10 mg by mouth 2 (two) times daily.  12/25/11  Yes Historical Provider, MD  KLOR-CON M20 20 MEQ tablet Take 1 tablet (20 mEq total) by mouth 2 (two) times daily. 01/02/16  Yes Rogelia Mire, NP  atorvastatin (LIPITOR) 20 MG tablet Take 1 tablet (20 mg total) by mouth at bedtime. Patient not taking: Reported on 04/26/2016 12/20/15   Ripudeep Krystal Eaton, MD  furosemide (LASIX) 20 MG tablet Take 1 tablet (20 mg total) by mouth daily. Patient not taking: Reported on 04/26/2016 01/02/16   Rogelia Mire, NP  metoprolol tartrate (LOPRESSOR) 25 MG tablet Take 1 tablet (25 mg total) by mouth 2 (two) times daily. Patient not taking: Reported on 04/26/2016 12/20/15   Ripudeep Krystal Eaton, MD  rivaroxaban (XARELTO) 20 MG TABS tablet Take 1 tablet (20 mg total) by mouth daily with supper. Patient not taking: Reported on 04/26/2016 12/20/15   Ripudeep Krystal Eaton, MD  sulfamethoxazole-trimethoprim (BACTRIM DS,SEPTRA DS) 800-160 MG tablet Take 1 tablet by mouth 2 (two) times daily. 04/26/16 05/03/16  Nat Christen, MD    Family History Family History  Problem Relation Age of Onset  . Emphysema Father   . Asthma Father   . Heart disease Mother     No details.  Still alive age 104    Social History Social History  Substance Use Topics  . Smoking status: Former Smoker    Types: Cigars  . Smokeless tobacco: Never Used     Comment: "quit smoking in the 1970s"  . Alcohol use Yes     Comment: "stopped drinking alcohol in ~ 1980; just drank a little on  the weekends when I did drink"     Allergies   Amoxicillin and Penicillins   Review of Systems Review of Systems  All other systems reviewed and are negative.    Physical Exam Updated Vital Signs BP 151/100 (BP Location: Left Arm)   Pulse (!) 56   Temp 97.8 F (36.6 C) (Oral)   Resp 20   Wt 246 lb 14.6 oz (112 kg)   SpO2 98%   BMI 36.46 kg/m   Physical Exam  Constitutional: He appears well-developed and well-nourished.  HENT:  Head: Normocephalic and atraumatic.  Eyes: Conjunctivae are normal.  Neck: Neck supple.  Cardiovascular: Normal rate and regular rhythm.   No murmur heard. Pulmonary/Chest: Effort normal and breath sounds normal. No  respiratory distress.  Abdominal: Soft. There is no tenderness.  Musculoskeletal: He exhibits no edema.  Neurological: He is alert.  Skin:  Left lower extremity: Minimal generalized erythema from the distal tibia distally  Psychiatric: He has a normal mood and affect.  Nursing note and vitals reviewed.    ED Treatments / Results  Labs (all labs ordered are listed, but only abnormal results are displayed) Labs Reviewed  CBC WITH DIFFERENTIAL/PLATELET - Abnormal; Notable for the following:       Result Value   Hemoglobin 12.9 (*)    HCT 38.6 (*)    Monocytes Absolute 1.2 (*)    All other components within normal limits  BASIC METABOLIC PANEL - Abnormal; Notable for the following:    Glucose, Bld 100 (*)    Calcium 8.5 (*)    All other components within normal limits    EKG  EKG Interpretation None       Radiology No results found.  Procedures Procedures (including critical care time)  Medications Ordered in ED Medications  sodium chloride 0.9 % bolus 500 mL (0 mLs Intravenous Stopped 04/26/16 1725)  vancomycin (VANCOCIN) IVPB 1000 mg/200 mL premix (0 mg Intravenous Stopped 04/26/16 1608)     Initial Impression / Assessment and Plan / ED Course  I have reviewed the triage vital signs and the nursing  notes.  Pertinent labs & imaging results that were available during my care of the patient were reviewed by me and considered in my medical decision making (see chart for details).  Clinical Course    Patient is nontoxic-appearing. Will start a dose of IV vancomycin. Discharge medication Bactrim DS. Discussed findings with the patient. He will return if symptoms worsen.`  Final Clinical Impressions(s) / ED Diagnoses   Final diagnoses:  Cellulitis of left lower extremity    New Prescriptions Discharge Medication List as of 04/26/2016  4:46 PM    START taking these medications   Details  sulfamethoxazole-trimethoprim (BACTRIM DS,SEPTRA DS) 800-160 MG tablet Take 1 tablet by mouth 2 (two) times daily., Starting Fri 04/26/2016, Until Fri 05/03/2016, Print         Nat Christen, MD 04/30/16 2008

## 2016-05-13 ENCOUNTER — Encounter (HOSPITAL_BASED_OUTPATIENT_CLINIC_OR_DEPARTMENT_OTHER): Payer: Medicare Other

## 2016-06-26 ENCOUNTER — Ambulatory Visit (HOSPITAL_BASED_OUTPATIENT_CLINIC_OR_DEPARTMENT_OTHER): Payer: Medicare Other | Attending: Cardiology

## 2016-07-14 ENCOUNTER — Ambulatory Visit (HOSPITAL_BASED_OUTPATIENT_CLINIC_OR_DEPARTMENT_OTHER): Payer: Medicare Other | Attending: Cardiology | Admitting: Cardiovascular Disease

## 2016-07-14 VITALS — Ht 66.0 in | Wt 232.0 lb

## 2016-07-14 DIAGNOSIS — E119 Type 2 diabetes mellitus without complications: Secondary | ICD-10-CM | POA: Insufficient documentation

## 2016-07-14 DIAGNOSIS — Z7901 Long term (current) use of anticoagulants: Secondary | ICD-10-CM | POA: Diagnosis not present

## 2016-07-14 DIAGNOSIS — G4761 Periodic limb movement disorder: Secondary | ICD-10-CM | POA: Insufficient documentation

## 2016-07-14 DIAGNOSIS — G4736 Sleep related hypoventilation in conditions classified elsewhere: Secondary | ICD-10-CM | POA: Diagnosis not present

## 2016-07-14 DIAGNOSIS — G4733 Obstructive sleep apnea (adult) (pediatric): Secondary | ICD-10-CM | POA: Diagnosis not present

## 2016-07-14 DIAGNOSIS — I10 Essential (primary) hypertension: Secondary | ICD-10-CM | POA: Insufficient documentation

## 2016-07-14 DIAGNOSIS — Z79899 Other long term (current) drug therapy: Secondary | ICD-10-CM | POA: Insufficient documentation

## 2016-07-14 DIAGNOSIS — G4730 Sleep apnea, unspecified: Secondary | ICD-10-CM | POA: Diagnosis present

## 2016-08-04 NOTE — Procedures (Signed)
Patient Name: [name] [name], Schippers Date: 07/14/2016 Gender: Male D.O.B: 1946/08/14 Age (years): 70 Referring Provider: Minus Breeding Height (inches): 43 Interpreting Physician: Shelva Majestic MD, ABSM Weight (lbs): 237 RPSGT: Baxter Flattery BMI: 38 MRN: [id number] Neck Size: 17.00  CLINICAL INFORMATION Sleep Study Type: NPSG Indication for sleep study: Diabetes, Fatigue, Hypertension, Re-Evaluation, Snoring, Witnessed Apneas Epworth Sleepiness Score: 16  SLEEP STUDY TECHNIQUE As per the AASM Manual for the Scoring of Sleep and Associated Events v2.3 (April 2016) with a hypopnea requiring 4% desaturations. The channels recorded and monitored were frontal, central and occipital EEG, electrooculogram (EOG), submentalis EMG (chin), nasal and oral airflow, thoracic and abdominal wall motion, anterior tibialis EMG, snore microphone, electrocardiogram, and pulse oximetry.  MEDICATIONS albuterol (PROVENTIL HFA;VENTOLIN HFA) 108 (90 Base) MCG/ACT inhaler albuterol (PROVENTIL) (5 MG/ML) 0.5% nebulizer solution amLODipine (NORVASC) 10 MG tablet atorvastatin (LIPITOR) 20 MG tablet furosemide (LASIX) 20 MG tablet KLOR-CON M20 20 MEQ tablet metoprolol tartrate (LOPRESSOR) 25 MG tablet rivaroxaban (XARELTO) 20 MG TABS tablet  Medications self-administered by patient taken the night of the study : N/A  SLEEP ARCHITECTURE The study was initiated at 9:59:03 PM and ended at 3:55:03 AM. Sleep onset time was 17.5 minutes and the sleep efficiency was 36.5%. The total sleep time was 130.0 minutes. Wake sleep onset (WASO) was 208.5 minutes. Stage REM latency was 188.5 minutes. The patient spent 16.15% of the night in stage N1 sleep, 59.23% in stage N2 sleep, 0.00% in stage N3 and 24.62% in REM. Alpha intrusion was absent. Supine sleep was 56.54%.  RESPIRATORY PARAMETERS The overall apnea/hypopnea index (AHI) was 9.2 per hour. There were 10 total apneas, including 10 obstructive, 0 central and  0 mixed apneas. There were 10 hypopneas and 1 RERAs. The AHI during Stage REM sleep was 18.8 per hour. AHI while supine was 9.0 per hour. The mean oxygen saturation was 91.41%. The minimum SpO2 during sleep was 80.00%. Moderate snoring was noted during this study.  CARDIAC DATA The 2 lead EKG demonstrated sinus rhythm. The mean heart rate was 50.37 beats per minute. Other EKG findings include:   Rare PACs. LEG MOVEMENT DATA The total PLMS were 129 with a resulting PLMS index of 59.54. Associated arousal with leg movement index was 1.8 .  IMPRESSIONS - Mild obstructive sleep apnea overall (AHI = 9.2/h); however, sleep apnea was of moderate severity during REM sleep with an AHI of 18.8/h. - No significant central sleep apnea occurred during this study (CAI = 0.0/h). - Significantly reduced sleep efficiency at 36.5% - Moderate oxygen desaturation to  nadir of 80.00%. - Abnormal sleep architecture with absence of slow-wave sleep and prolonged latency to REM sleep. - The patient snored with Moderate snoring volume. - Rare  PACs. - Severe periodic limb movements of sleep occurred during the study. No significant associated arousals.  DIAGNOSIS - Obstructive Sleep Apnea (327.23 [G47.33 ICD-10]) - Nocturnal Hypoxemia (327.26 [G47.36 ICD-10]) - Periodic limb movement disorder of sleep  RECOMMENDATIONS - In this patient with significant cardiovascular comorbidities and significant symptoms, recommend  therapeutic CPAP titration to determine optimal pressure required to alleviate sleep disordered breathing. - Efforts should be made to optimize nasal and oral pharyngeal patency. - Avoid alcohol, sedatives and other CNS depressants that may worsen sleep apnea and disrupt normal sleep architecture. - In this patient with a significantly increased PLMS index,  if patient is symptomatic with restless legs once CPAP therapy is initiated , consider pharmacotherapy. - Sleep hygiene should be reviewed to  assess  factors that may improve sleep quality. - Weight management (BMI 38) and regular exercise should be initiated or continued if appropriate.  [Electronically signed] 08/04/2016 10:58 AM  Shelva Majestic MD, [facility], [place], American Board of Sleep Medicine   NPI: [id number]  [place] PH: [phone]   FX: [phone] [place]

## 2016-08-05 ENCOUNTER — Ambulatory Visit: Payer: Medicare Other | Admitting: Cardiology

## 2016-08-07 NOTE — Progress Notes (Signed)
1   HPI The patient presents for evaluation of aortic root dilatation and aortic insufficiency.   He was in the hospital earlier this year with dyspnea and had a mild troponin elevation.  Cath demonstrated an occluded OM.  He was found to have atrial fib and was started on Xarelto.  He did have some heart fluttering that lasts for a few seconds at a time.   This is not changed. The patient denies any new symptoms such as chest discomfort, neck or arm discomfort. There has been no new shortness of breath, PND or orthopnea. There have been no reported palpitations, presyncope or syncope.  He is walking 15 minutes per day.  He also has had one episode when he woke himself up and felt like he was choking or seizing.  This of course was vague as it was in the middle of a deep sleep.    Allergies  Allergen Reactions  . Amoxicillin Other (See Comments)    Can't move Has patient had a PCN reaction causing immediate rash, facial/tongue/throat swelling, SOB or lightheadedness with hypotension: No Has patient had a PCN reaction causing severe rash involving mucus membranes or skin necrosis: No Has patient had a PCN reaction that required hospitalization No Has patient had a PCN reaction occurring within the last 10 years: No If all of the above answers are "NO", then may proceed with Cephalosporin use.   [name] Penicillins Other (See Comments)    Can't move/dizziness Has patient had a PCN reaction causing immediate rash, facial/tongue/throat swelling, SOB or lightheadedness with hypotension: No Has patient had a PCN reaction causing severe rash involving mucus membranes or skin necrosis: No Has patient had a PCN reaction that required hospitalization No Has patient had a PCN reaction occurring within the last 10 years: No If all of the above answers are "NO", then may proceed with Cephalosporin use.     Current Outpatient Prescriptions  Medication Sig Dispense Refill  . albuterol (PROVENTIL HFA;VENTOLIN  HFA) 108 (90 Base) MCG/ACT inhaler Inhale 2 puffs into the lungs every 6 (six) hours as needed for wheezing or shortness of breath. 1 Inhaler 3  . albuterol (PROVENTIL) (5 MG/ML) 0.5% nebulizer solution Take 0.5 mLs (2.5 mg total) by nebulization every 6 (six) hours as needed for wheezing or shortness of breath. 20 mL 12  . amLODipine (NORVASC) 10 MG tablet Take 10 mg by mouth 2 (two) times daily.     [name] atorvastatin (LIPITOR) 20 MG tablet Take 1 tablet (20 mg total) by mouth at bedtime. (Patient not taking: Reported on 04/26/2016) 20 tablet 3  . furosemide (LASIX) 20 MG tablet Take 1 tablet (20 mg total) by mouth daily. (Patient not taking: Reported on 04/26/2016) 30 tablet 6  . KLOR-CON M20 20 MEQ tablet Take 1 tablet (20 mEq total) by mouth 2 (two) times daily. 60 tablet 6  . metoprolol tartrate (LOPRESSOR) 25 MG tablet Take 1 tablet (25 mg total) by mouth 2 (two) times daily. (Patient not taking: Reported on 04/26/2016) 60 tablet 3  . rivaroxaban (XARELTO) 20 MG TABS tablet Take 1 tablet (20 mg total) by mouth daily with supper. (Patient not taking: Reported on 04/26/2016) 30 tablet 3   No current facility-administered medications for this visit.     Past Medical History:  Diagnosis Date  . Anxiety   . Aortic root dilatation ([place])    a. 12/2015 Echo: mod dil of sinus of valsalva and Ao root (5mm).  [name] CAD (coronary artery disease)  a. 12/2015 NSTEMI/Cath (in setting of PAF):  LM nl, LAD 30p,  LCX 78m, RCA  ok, AM 100%, RPDA1 40, RPDA 2 60 ->Med Rx.  . Childhood asthma   . Chronic diastolic CHF (congestive heart failure) ([place])    a. 12/2015 Echo: EF 50-55%, mod AI, mod Ao root dil, mild MR, mod dil LA, mod RA.  [name] COPD (chronic obstructive pulmonary disease) ([place])   . Diabetes mellitus type II    diet controlled  . Hyperlipidemia   . Hypertensive heart disease   . Moderate aortic insufficiency    a. 12/2015 Echo: Mod AI.  [name] Paroxysmal atrial fibrillation ([place])    a. 12/2015 started on Xarelto  (CHA2DS2VASc = 4-5).  . Pneumonia    "2-3 times" (10/17/2015)  . Prostate cancer [facility])    "doctor saw a spot on my prostate"   . Renal cell carcinoma ([place])    I do not see confirmation of this diagnosis.  He sees a urologist currently.    . Sleep apnea    "don't use mask since I lost weight" (10/17/2015)  . Syncope     Past Surgical History:  Procedure Laterality Date  . BACK SURGERY    . CARDIAC CATHETERIZATION N/A 12/18/2015   Procedure: Left Heart Cath and Coronary Angiography;  Surgeon: Leonie Man, MD;  Location: [facility] LAB;  Service: Cardiovascular;  Laterality: N/A;  . LUMBAR [place]   "replaced a disc"     ROS:  As stated in the HPI and negative for all other systems.  PHYSICAL EXAM BP 132/90   Pulse (!) 58   Ht 5\' 6"  (1.676 m)   Wt 244 lb 9.6 oz (110.9 kg)   BMI 39.48 kg/m   GENERAL:  Well appearing HEENT:  Pupils equal round and reactive, fundi not visualized, oral mucosa unremarkable, proptosis, poor dentition NECK:  No jugular venous distention, waveform within normal limits, carotid upstroke brisk and symmetric, no bruits, no thyromegaly LUNGS:  Clear CHEST:  Unremarkable HEART:  PMI not displaced or sustained,S1 and S2 within normal limits, no S3, no S4, no clicks, no rubs, diastolic murmur at the left 3rd intercostal space, short systolic murmur ABD:  Flat, positive bowel sounds normal in frequency in pitch, no bruits, no rebound, no guarding, no midline pulsatile mass, no hepatomegaly, no splenomegaly EXT:  2 plus pulses throughout, mild leg edema, no cyanosis, sores on the lower legs healed   Lab Results  Component Value Date   CHOL 187 12/19/2015   TRIG 111 12/19/2015   HDL 47 12/19/2015   LDLCALC 118 (H) 12/19/2015    ASSESSMENT AND PLAN  Chronic diastolic congestive heart failure:    He seems to be euvolemic.  No change in therapy.   Paroxysmal atrial fibrillation:   3  Mr. Dirk A Moise has a CHA2DS2 - VASc score of 3. with  a risk of stroke of 3.2%. He reports continued palpitations and vague symptoms as above.  I will apply an event monitor.  Grayson A Stolz will need a 21 day event monitor.  The patients symptoms necessitate an event monitor.  The symptoms are too infrequent to be identified on a Holter monitor.    Hypertensive heart disease:   His blood pressure is well controlled.  He will continue the meds as listed.  Dilated aortic root: He tried to have his MRI but couldn't stay in the machine. I will try to follow this in the future with  CTs.   Coronary artery disease: He has not been having any chest pain. Recent catheterization revealed total occlusion of the acute marginal with otherwise nonobstructive disease. He remains on beta blocker and statin therapy. He will continue on current therapy.   Hyperlipidemia: LDL was 118 in April. He had normal LFTs at that time. Continue Lipitor therapy.  Sleep Apnea:  He did have a sleep study and CPAP was recommended.  His machine is very old and he "holds it on by hand."  I will get him an appt with Dr. Claiborne Billings.

## 2016-08-08 ENCOUNTER — Ambulatory Visit (INDEPENDENT_AMBULATORY_CARE_PROVIDER_SITE_OTHER): Payer: Medicare Other | Admitting: Cardiology

## 2016-08-08 ENCOUNTER — Encounter (INDEPENDENT_AMBULATORY_CARE_PROVIDER_SITE_OTHER): Payer: Medicare Other

## 2016-08-08 ENCOUNTER — Encounter: Payer: Self-pay | Admitting: Cardiology

## 2016-08-08 VITALS — BP 132/90 | HR 58 | Ht 66.0 in | Wt 244.6 lb

## 2016-08-08 DIAGNOSIS — E782 Mixed hyperlipidemia: Secondary | ICD-10-CM

## 2016-08-08 DIAGNOSIS — I5032 Chronic diastolic (congestive) heart failure: Secondary | ICD-10-CM

## 2016-08-08 DIAGNOSIS — I480 Paroxysmal atrial fibrillation: Secondary | ICD-10-CM

## 2016-08-08 DIAGNOSIS — I77810 Thoracic aortic ectasia: Secondary | ICD-10-CM | POA: Diagnosis not present

## 2016-08-08 DIAGNOSIS — I10 Essential (primary) hypertension: Secondary | ICD-10-CM

## 2016-08-08 NOTE — Patient Instructions (Signed)
Medication Instructions:  Continue current medications  Labwork: None Ordered  Testing/Procedures: Your physician has recommended that you wear an event monitor for 21 days. Event monitors are medical devices that record the heart's electrical activity. Doctors most often [place] these monitors to diagnose arrhythmias. Arrhythmias are problems with the speed or rhythm of the heartbeat. The monitor is a small, portable device. You can wear one while you do your normal daily activities. This is usually used to diagnose what is causing palpitations/syncope (passing out).   Follow-Up: Your physician recommends that you schedule a follow-up appointment in: Friday December 15th @ 2:45 pm   Any Other Special Instructions Will Be Listed Below (If Applicable).    If you need a refill on your cardiac medications before your next appointment, please call your pharmacy.

## 2016-08-16 ENCOUNTER — Encounter: Payer: Self-pay | Admitting: *Deleted

## 2016-08-16 ENCOUNTER — Ambulatory Visit: Payer: Medicare Other | Admitting: Cardiovascular Disease

## 2016-08-22 ENCOUNTER — Other Ambulatory Visit: Payer: Self-pay | Admitting: *Deleted

## 2016-08-22 DIAGNOSIS — G4733 Obstructive sleep apnea (adult) (pediatric): Secondary | ICD-10-CM

## 2016-08-22 DIAGNOSIS — Z9989 Dependence on other enabling machines and devices: Principal | ICD-10-CM

## 2016-08-22 NOTE — Progress Notes (Signed)
Patient had appointment to see Dr Claiborne Billings in the December clinic, and was a N/S. CPAP titration ordered.

## 2016-09-03 ENCOUNTER — Encounter (HOSPITAL_COMMUNITY): Payer: Self-pay | Admitting: Emergency Medicine

## 2016-09-03 ENCOUNTER — Emergency Department (HOSPITAL_COMMUNITY): Payer: Medicare Other

## 2016-09-03 DIAGNOSIS — Z7902 Long term (current) use of antithrombotics/antiplatelets: Secondary | ICD-10-CM | POA: Insufficient documentation

## 2016-09-03 DIAGNOSIS — I2510 Atherosclerotic heart disease of native coronary artery without angina pectoris: Secondary | ICD-10-CM | POA: Insufficient documentation

## 2016-09-03 DIAGNOSIS — I5032 Chronic diastolic (congestive) heart failure: Secondary | ICD-10-CM | POA: Diagnosis not present

## 2016-09-03 DIAGNOSIS — Z79899 Other long term (current) drug therapy: Secondary | ICD-10-CM | POA: Diagnosis not present

## 2016-09-03 DIAGNOSIS — Z8546 Personal history of malignant neoplasm of prostate: Secondary | ICD-10-CM | POA: Diagnosis not present

## 2016-09-03 DIAGNOSIS — I110 Hypertensive heart disease with heart failure: Secondary | ICD-10-CM | POA: Insufficient documentation

## 2016-09-03 DIAGNOSIS — R002 Palpitations: Secondary | ICD-10-CM | POA: Diagnosis present

## 2016-09-03 DIAGNOSIS — Z87891 Personal history of nicotine dependence: Secondary | ICD-10-CM | POA: Insufficient documentation

## 2016-09-03 DIAGNOSIS — E119 Type 2 diabetes mellitus without complications: Secondary | ICD-10-CM | POA: Insufficient documentation

## 2016-09-03 DIAGNOSIS — J449 Chronic obstructive pulmonary disease, unspecified: Secondary | ICD-10-CM | POA: Insufficient documentation

## 2016-09-03 NOTE — ED Triage Notes (Signed)
Pt comes in with complaints of "heart issues".  States he sees a cardiologist regularly due to altered potassium levels.  States he has a fluttering feeling in his heart.  Denies chest pain and states he just "feels bad in his chest".  Pt states he ran out of good food that he is supposed to eat at home and can't get to the store until tomorrow so he is concerned.  Pt unable to give me specific symptoms. Ambulatory in triage.

## 2016-09-04 ENCOUNTER — Emergency Department (HOSPITAL_COMMUNITY)
Admission: EM | Admit: 2016-09-04 | Discharge: 2016-09-04 | Disposition: A | Discharge status: Home / Self Care | Payer: Medicare Other | Attending: Emergency Medicine | Admitting: Emergency Medicine

## 2016-09-04 ENCOUNTER — Other Ambulatory Visit: Payer: Self-pay | Admitting: Nurse Practitioner

## 2016-09-04 DIAGNOSIS — R002 Palpitations: Secondary | ICD-10-CM | POA: Diagnosis not present

## 2016-09-04 LAB — BASIC METABOLIC PANEL
Anion gap: 4 — ABNORMAL LOW (ref 5–15)
BUN: 14 mg/dL (ref 6–20)
CALCIUM: 8.8 mg/dL — AB (ref 8.9–10.3)
CO2: 26 mmol/L (ref 22–32)
Chloride: 105 mmol/L (ref 101–111)
Creatinine, Ser: 0.95 mg/dL (ref 0.61–1.24)
GFR calc Af Amer: >60 mL/min (ref >60)
GFR calc non Af Amer: >60 mL/min (ref >60)
Glucose, Bld: 102 mg/dL — ABNORMAL HIGH (ref 65–99)
Potassium: 3.5 mmol/L (ref 3.5–5.1)
Sodium: 135 mmol/L (ref 135–145)

## 2016-09-04 LAB — CBC
HCT: 36.4 % — ABNORMAL LOW (ref 39.0–52.0)
HEMOGLOBIN: 12.4 g/dL — AB (ref 13.0–17.0)
MCH: 31.1 pg (ref 26.0–34.0)
MCHC: 34.1 g/dL (ref 30.0–36.0)
MCV: 91.2 fL (ref 78.0–100.0)
Platelets: 299 10*3/uL (ref 150–400)
RBC: 3.99 MIL/uL — ABNORMAL LOW (ref 4.22–5.81)
RDW: 13.9 % (ref 11.5–15.5)
WBC: 7.9 10*3/uL (ref 4.0–10.5)

## 2016-09-04 LAB — I-STAT TROPONIN, ED
Troponin i, poc: 0 ng/mL (ref 0.00–0.08)
Troponin i, poc: 0.01 ng/mL (ref 0.00–0.08)

## 2016-09-04 NOTE — ED Provider Notes (Signed)
[facility] DEPT Provider Note   CSN: [id number] Arrival date & time: 09/03/16  2334    By signing my name below, I, Macon Large, attest that this documentation has been prepared under the direction and in the presence of Sherwood Gambler, MD. Electronically Signed: Macon Large, ED Scribe. 09/04/16. 2:49 AM.  History   Chief Complaint Chief Complaint  Patient presents with  . Palpitations  . Cardiac Issues   The history is provided by the patient. No language interpreter was used.   HPI Comments: [name] [name] is a 71 y.o. male with PMHx of palpations, hypoglycemia, hyperlipidemia, hypertensive heart disease, CAD, sleep apnea, paroxysmal atrial fibrillation who presents to the Emergency Department complaining of moderate, constant, palpitations onset a couple weeks ago. He notes an accompanied "fluttering" sensation. Pt reports not having a complete breakfast today like he typically does due to associated SOB this morning. Pt notes the SOB only lasted a few seconds. No alleviating factors noted. Per nurse note, He reports seeing a cardiologist regularly due to altered potassium levels. Denies CP, dizziness and light-headedness.   Past Medical History:  Diagnosis Date  . Anxiety   . Aortic root dilatation ([place])    a. 12/2015 Echo: mod dil of sinus of valsalva and Ao root (39m).  .Marland KitchenCAD (coronary artery disease)    a. 12/2015 NSTEMI/Cath (in setting of PAF):  LM nl, LAD 30p,  LCX 332mRCA  ok, AM 100%, RPDA1 40, RPDA 2 60 ->Med Rx.  . Childhood asthma   . Chronic diastolic CHF (congestive heart failure) (HCA[place]   a. 12/2015 Echo: EF 50-55%, mod AI, mod Ao root dil, mild MR, mod dil LA, mod RA.  . Marland KitchenOPD (chronic obstructive pulmonary disease) (HCRapid [place]  . Diabetes mellitus type II    diet controlled  . Hyperlipidemia   . Hypertensive heart disease   . Moderate aortic insufficiency    a. 12/2015 Echo: Mod AI.  . Marland Kitchenaroxysmal atrial fibrillation ([place]CP[place]   a. 12/2015 started on Xarelto  (CHA2DS2VASc = 4-5).  . Pneumonia    "2-3 times" (10/17/2015)  . [facility]   "doctor saw a spot on my prostate"   . Renal cell carcinoma (HCKen[place]   I do not see confirmation of this diagnosis.  He sees a urologist currently.    . Sleep apnea    "don't use mask since I lost weight" (10/17/2015)  . Syncope     Patient Active Problem List   Diagnosis Date Noted  . CAD (coronary artery disease)   . Chronic diastolic CHF (congestive heart failure) (HCBed[facility]  . Hyperlipidemia   . Hypertensive heart disease   . Paroxysmal atrial fibrillation (HCHawk [place]04/16/2017  . Multiple myeloma (HCGulf04/16/2017  . Unstable angina (HCExcels[facility] Estates04/15/2017  . Asthma exacerbation 10/18/2015  . Mild intermittent asthma 10/18/2015  . Aortic root dilatation (HCElm[place]02/14/2017  . Diabetes (HCNew [place]02/14/2017  . 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 Cervical pain 12/09/2014  . Hand paresthesia 12/09/2014  . Renal lesion   . Headache 11/03/2014  . PND (post-nasal drip) 11/03/2014  . Headache, temporal 09/30/2014  . Elevated prostate specific antigen (PSA) 07/12/2014  . Allergic rhinitis 07/12/2014  . LBP (low back pain) 07/12/2014  . Palpitation 07/11/2014  . Awareness of heartbeats 07/11/2014  . Blood glucose elevated 04/05/2014  . Patient's other noncompliance with medication regimen 08/10/2013  . Cellulitis 04/28/2013  . Chronic venous insufficiency 07/23/2012  . Auditory disturbance 02/25/2012  .  Stasis, venous 01/30/2012  . Decreased potassium in the blood 06/28/2011  . Athlete's foot 04/11/2011  . Obstructive apnea 04/11/2011  . RAD (reactive airway disease) 04/11/2011  . HLD (hyperlipidemia) 09/09/2010  . Abnormal blood sugar 09/07/2010  . Essential (primary) hypertension 09/07/2010  . Avitaminosis D 07/12/2010  . Hypoglycemia 06/20/2010  . 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 Anxiety, generalized 04/04/2010  . Kidney lump 04/04/2010  . Cardiac  conduction disorder 02/27/2010  . Pruritic dermatitis 11/10/2009  . Benign prostatic hypertrophy without urinary obstruction 09/13/2009  . Abnormal findings on examination of genitourinary organs 08/09/2009  . Hereditary and idiopathic neuropathy 07/05/2009  . Excessive urination at night 02/23/2008    Past Surgical History:  Procedure Laterality Date  . BACK SURGERY    . CARDIAC CATHETERIZATION N/A 12/18/2015   Procedure: Left Heart Cath and Coronary Angiography;  Surgeon: Leonie Man, MD;  Location: [place] [facility];  Service: Cardiovascular;  Laterality: N/A;  . LUMBAR [place]   "replaced a disc"       Home Medications    Prior to Admission medications   Medication Sig Start Date End Date Taking? Authorizing Provider  albuterol (PROVENTIL HFA;VENTOLIN HFA) 108 (90 Base) MCG/ACT inhaler Inhale 2 puffs into the lungs every 6 (six) hours as needed for wheezing or shortness of breath. 10/18/15   Alexa Angela Burke, MD  albuterol (PROVENTIL) (5 MG/ML) 0.5% nebulizer solution Take 0.5 mLs (2.5 mg total) by nebulization every 6 (six) hours as needed for wheezing or shortness of breath. 10/18/15   Florinda Marker, MD  amLODipine (NORVASC) 10 MG tablet Take 10 mg by mouth 2 (two) times daily.  12/25/11   Historical Provider, MD  atorvastatin (LIPITOR) 20 MG tablet Take 1 tablet (20 mg total) by mouth at bedtime. Patient not taking: Reported on 04/26/2016 12/20/15   Ripudeep Krystal Eaton, MD  furosemide (LASIX) 20 MG tablet Take 1 tablet (20 mg total) by mouth daily. Patient not taking: Reported on 04/26/2016 01/02/16   Rogelia Mire, NP  KLOR-CON M20 20 MEQ tablet Take 1 tablet (20 mEq total) by mouth 2 (two) times daily. 01/02/16   Rogelia Mire, NP  metoprolol tartrate (LOPRESSOR) 25 MG tablet Take 1 tablet (25 mg total) by mouth 2 (two) times daily. Patient not taking: Reported on 04/26/2016 12/20/15   Ripudeep Krystal Eaton, MD  rivaroxaban (XARELTO) 20 MG TABS tablet Take 1 tablet (20 mg  total) by mouth daily with supper. Patient not taking: Reported on 04/26/2016 12/20/15   Ripudeep Krystal Eaton, MD    Family History Family History  Problem Relation Age of Onset  . Emphysema Father   . Asthma Father   . Heart disease Mother     No details.  Still alive age 74    Social History Social History  Substance Use Topics  . Smoking status: Former Smoker    Types: Cigars  . Smokeless tobacco: Never Used     Comment: "quit smoking in the 1970s"  . Alcohol use Yes     Comment: "stopped drinking alcohol in ~ 1980; just drank a little on the weekends when I did drink"     Allergies   Amoxicillin and Penicillins   Review of Systems Review of Systems  Respiratory: Positive for shortness of breath.   Cardiovascular: Positive for palpitations. Negative for chest pain.  Neurological: Negative for dizziness and light-headedness.  All other systems reviewed and are negative.  Physical Exam Updated Vital Signs BP 126/72 (BP Location: Left Arm)   Pulse (!) 56   Temp 97.7 F (36.5 C) (Oral)   Resp 14   Ht _0  (1.676 m)   Wt 237 lb (107.5 kg)   SpO2 100%   BMI 38.25 kg/m   Physical Exam  Constitutional: He is oriented to person, place, and time. He appears well-developed and well-nourished.  HENT:  Head: Normocephalic and atraumatic.  Right Ear: External ear normal.  Left Ear: External ear normal.  Nose: Nose normal.  Eyes: Right eye exhibits no discharge. Left eye exhibits no discharge.  Neck: Neck supple.  Cardiovascular: Regular rhythm.   Murmur heard. Heart rate high 50's, low 60's.   Pulmonary/Chest: Effort normal and breath sounds normal.  Abdominal: Soft. There is no tenderness.  Musculoskeletal: He exhibits no edema.  Neurological: He is alert and oriented to person, place, and time.  Skin: Skin is warm and dry.  Nursing note and vitals reviewed.    ED Treatments / Results   DIAGNOSTIC STUDIES: Oxygen Saturation is 99% on RA, normal by my  interpretation.    COORDINATION OF CARE: 2:48 AM Discussed treatment plan with pt at bedside which includes labs, chest imaging and EKG and pt agreed to plan.   Labs (all labs ordered are listed, but only abnormal results are displayed) Labs Reviewed  BASIC METABOLIC PANEL - Abnormal; Notable for the following:       Result Value   Glucose, Bld 102 (*)    Calcium 8.8 (*)    Anion gap 4 (*)    All other components within normal limits  CBC - Abnormal; Notable for the following:    RBC 3.99 (*)    Hemoglobin 12.4 (*)    HCT 36.4 (*)    All other components within normal limits  I-STAT TROPOININ, ED  I-STAT TROPOININ, ED    EKG  EKG Interpretation  Date/Time:  Tuesday September 03 2016 23:41:27 EST Ventricular Rate:  67 PR Interval:    QRS Duration: 112 QT Interval:  416 QTC Calculation: 440 R Axis:   -51 Text Interpretation:  Sinus rhythm Atrial premature complex Left anterior fascicular block Abnormal R-wave progression, late transition Borderline T abnormalities, lateral leads T wave change ssimilar to April 2017 Confirmed by Regenia Skeeter MD, Lilyonna Steidle [phone]) on 09/04/2016 2:20:07 AM       Radiology Dg Chest 2 View  Result Date: 09/04/2016 CLINICAL DATA:  Palpitations for several hours EXAM: CHEST  2 VIEW COMPARISON:  12/26/2015 FINDINGS: Marked cardiomegaly, unchanged. Hilar and mediastinal contours are unremarkable and unchanged. Unchanged hyperinflation. Lungs are clear. Pulmonary vasculature is normal. No pleural effusions. IMPRESSION: Cardiomegaly.  Mild hyperinflation.  No consolidation or effusion. Electronically Signed   By: Andreas Newport M.D.   On: 09/04/2016 00:02    Procedures Procedures (including critical care time)  Medications Ordered in ED Medications - No data to display   Initial Impression / Assessment and Plan / ED Course  I have reviewed the triage vital signs and the nursing notes.  Pertinent labs & imaging results that were available during my  care of the patient were reviewed by me and considered in my medical decision making (see chart for details).  Clinical Course as of Sep 04 712  Wed Sep 04, 2016  0235 Overall poor historian, no acute distress. Given transient dyspnea, delta troponin but suspicion for ACS, PE, dissection quite low. No CP. HR stable in high 50s, low 60s  [SG]  0409 2nd troponin negative. Highly doubt ACS, no CP and only a few seconds of dyspnea. No near-syncope/syncope. Likely from his chronic afib. Currently sinus with some ectopic beats. No evidence of RVR. D/c home with return precautions and f/u with cardiology  [SG]    Clinical Course User Index [SG] Sherwood Gambler, MD    Patient appears to have palpitations/fluttering but no signs of significant arrhythmia or rapid HR. No CP. He feels like it's from not eating well yesterday which might be contributing. Does not appear ill or dehydrated. Plan to d/c home with return precautions and f/u with cardiology.  Final Clinical Impressions(s) / ED Diagnoses   Final diagnoses:  Heart palpitations    New Prescriptions Discharge Medication List as of 09/04/2016  4:09 AM      I personally performed the services described in this documentation, which was scribed in my presence. The recorded information has been reviewed and is accurate.     Sherwood Gambler, MD 09/04/16 [phone]

## 2016-09-04 NOTE — ED Notes (Signed)
Patient verbalized understanding of discharge instructions and importance of following up with Cardiologist. Denies any pain or discomfort. Able to independently ambulate out to private vehicle.

## 2016-09-26 ENCOUNTER — Encounter: Payer: Self-pay | Admitting: *Deleted

## 2016-10-01 ENCOUNTER — Ambulatory Visit (HOSPITAL_BASED_OUTPATIENT_CLINIC_OR_DEPARTMENT_OTHER): Payer: Medicare Other | Attending: Cardiovascular Disease

## 2016-10-11 ENCOUNTER — Inpatient Hospital Stay (HOSPITAL_COMMUNITY): Payer: Medicare Other

## 2016-10-11 ENCOUNTER — Encounter (HOSPITAL_COMMUNITY): Payer: Self-pay | Admitting: Emergency Medicine

## 2016-10-11 ENCOUNTER — Observation Stay (HOSPITAL_COMMUNITY)
Admission: EM | Admit: 2016-10-11 | Discharge: 2016-10-13 | Disposition: A | Discharge status: Home / Self Care | Payer: Medicare Other | Attending: Family Medicine | Admitting: Family Medicine

## 2016-10-11 ENCOUNTER — Emergency Department (HOSPITAL_COMMUNITY): Payer: Medicare Other

## 2016-10-11 DIAGNOSIS — M4802 Spinal stenosis, cervical region: Secondary | ICD-10-CM | POA: Diagnosis not present

## 2016-10-11 DIAGNOSIS — I480 Paroxysmal atrial fibrillation: Secondary | ICD-10-CM | POA: Diagnosis present

## 2016-10-11 DIAGNOSIS — R002 Palpitations: Secondary | ICD-10-CM | POA: Diagnosis present

## 2016-10-11 DIAGNOSIS — F411 Generalized anxiety disorder: Secondary | ICD-10-CM | POA: Insufficient documentation

## 2016-10-11 DIAGNOSIS — M5020 Other cervical disc displacement, unspecified cervical region: Secondary | ICD-10-CM | POA: Insufficient documentation

## 2016-10-11 DIAGNOSIS — I491 Atrial premature depolarization: Secondary | ICD-10-CM | POA: Insufficient documentation

## 2016-10-11 DIAGNOSIS — S14126A Central cord syndrome at C6 level of cervical spinal cord, initial encounter: Secondary | ICD-10-CM | POA: Insufficient documentation

## 2016-10-11 DIAGNOSIS — M79641 Pain in right hand: Secondary | ICD-10-CM | POA: Diagnosis not present

## 2016-10-11 DIAGNOSIS — I2510 Atherosclerotic heart disease of native coronary artery without angina pectoris: Secondary | ICD-10-CM | POA: Insufficient documentation

## 2016-10-11 DIAGNOSIS — M79642 Pain in left hand: Secondary | ICD-10-CM | POA: Insufficient documentation

## 2016-10-11 DIAGNOSIS — I110 Hypertensive heart disease with heart failure: Secondary | ICD-10-CM | POA: Diagnosis not present

## 2016-10-11 DIAGNOSIS — Z23 Encounter for immunization: Secondary | ICD-10-CM | POA: Insufficient documentation

## 2016-10-11 DIAGNOSIS — G4761 Periodic limb movement disorder: Secondary | ICD-10-CM

## 2016-10-11 DIAGNOSIS — M47812 Spondylosis without myelopathy or radiculopathy, cervical region: Secondary | ICD-10-CM | POA: Diagnosis not present

## 2016-10-11 DIAGNOSIS — G4733 Obstructive sleep apnea (adult) (pediatric): Secondary | ICD-10-CM | POA: Diagnosis not present

## 2016-10-11 DIAGNOSIS — R55 Syncope and collapse: Secondary | ICD-10-CM | POA: Diagnosis present

## 2016-10-11 DIAGNOSIS — S14125A Central cord syndrome at C5 level of cervical spinal cord, initial encounter: Principal | ICD-10-CM | POA: Diagnosis present

## 2016-10-11 DIAGNOSIS — N529 Male erectile dysfunction, unspecified: Secondary | ICD-10-CM | POA: Insufficient documentation

## 2016-10-11 DIAGNOSIS — D472 Monoclonal gammopathy: Secondary | ICD-10-CM | POA: Diagnosis not present

## 2016-10-11 DIAGNOSIS — I872 Venous insufficiency (chronic) (peripheral): Secondary | ICD-10-CM | POA: Insufficient documentation

## 2016-10-11 DIAGNOSIS — G47419 Narcolepsy without cataplexy: Secondary | ICD-10-CM | POA: Diagnosis not present

## 2016-10-11 DIAGNOSIS — Z91148 Patient's other noncompliance with medication regimen for other reason: Secondary | ICD-10-CM

## 2016-10-11 DIAGNOSIS — G3189 Other specified degenerative diseases of nervous system: Secondary | ICD-10-CM | POA: Diagnosis not present

## 2016-10-11 DIAGNOSIS — C9000 Multiple myeloma not having achieved remission: Secondary | ICD-10-CM | POA: Diagnosis not present

## 2016-10-11 DIAGNOSIS — R569 Unspecified convulsions: Secondary | ICD-10-CM | POA: Insufficient documentation

## 2016-10-11 DIAGNOSIS — M2578 Osteophyte, vertebrae: Secondary | ICD-10-CM | POA: Insufficient documentation

## 2016-10-11 DIAGNOSIS — X58XXXA Exposure to other specified factors, initial encounter: Secondary | ICD-10-CM | POA: Diagnosis not present

## 2016-10-11 DIAGNOSIS — Z87891 Personal history of nicotine dependence: Secondary | ICD-10-CM | POA: Insufficient documentation

## 2016-10-11 DIAGNOSIS — W19XXXA Unspecified fall, initial encounter: Secondary | ICD-10-CM

## 2016-10-11 DIAGNOSIS — E785 Hyperlipidemia, unspecified: Secondary | ICD-10-CM | POA: Diagnosis not present

## 2016-10-11 DIAGNOSIS — J4520 Mild intermittent asthma, uncomplicated: Secondary | ICD-10-CM | POA: Diagnosis not present

## 2016-10-11 DIAGNOSIS — Z7901 Long term (current) use of anticoagulants: Secondary | ICD-10-CM | POA: Insufficient documentation

## 2016-10-11 DIAGNOSIS — M50223 Other cervical disc displacement at C6-C7 level: Secondary | ICD-10-CM | POA: Diagnosis not present

## 2016-10-11 DIAGNOSIS — E1165 Type 2 diabetes mellitus with hyperglycemia: Secondary | ICD-10-CM | POA: Insufficient documentation

## 2016-10-11 DIAGNOSIS — S14127A Central cord syndrome at C7 level of cervical spinal cord, initial encounter: Secondary | ICD-10-CM | POA: Insufficient documentation

## 2016-10-11 DIAGNOSIS — F40240 Claustrophobia: Secondary | ICD-10-CM | POA: Insufficient documentation

## 2016-10-11 DIAGNOSIS — H0520 Unspecified exophthalmos: Secondary | ICD-10-CM | POA: Insufficient documentation

## 2016-10-11 DIAGNOSIS — Z880 Allergy status to penicillin: Secondary | ICD-10-CM | POA: Insufficient documentation

## 2016-10-11 DIAGNOSIS — M1812 Unilateral primary osteoarthritis of first carpometacarpal joint, left hand: Secondary | ICD-10-CM | POA: Insufficient documentation

## 2016-10-11 DIAGNOSIS — J449 Chronic obstructive pulmonary disease, unspecified: Secondary | ICD-10-CM | POA: Insufficient documentation

## 2016-10-11 DIAGNOSIS — I351 Nonrheumatic aortic (valve) insufficiency: Secondary | ICD-10-CM | POA: Insufficient documentation

## 2016-10-11 DIAGNOSIS — R202 Paresthesia of skin: Secondary | ICD-10-CM | POA: Diagnosis present

## 2016-10-11 DIAGNOSIS — I5032 Chronic diastolic (congestive) heart failure: Secondary | ICD-10-CM | POA: Diagnosis not present

## 2016-10-11 DIAGNOSIS — N400 Enlarged prostate without lower urinary tract symptoms: Secondary | ICD-10-CM | POA: Insufficient documentation

## 2016-10-11 DIAGNOSIS — G609 Hereditary and idiopathic neuropathy, unspecified: Secondary | ICD-10-CM | POA: Insufficient documentation

## 2016-10-11 DIAGNOSIS — R001 Bradycardia, unspecified: Secondary | ICD-10-CM | POA: Insufficient documentation

## 2016-10-11 DIAGNOSIS — I359 Nonrheumatic aortic valve disorder, unspecified: Secondary | ICD-10-CM | POA: Insufficient documentation

## 2016-10-11 DIAGNOSIS — I252 Old myocardial infarction: Secondary | ICD-10-CM | POA: Insufficient documentation

## 2016-10-11 DIAGNOSIS — R200 Anesthesia of skin: Secondary | ICD-10-CM | POA: Insufficient documentation

## 2016-10-11 DIAGNOSIS — G9389 Other specified disorders of brain: Secondary | ICD-10-CM | POA: Insufficient documentation

## 2016-10-11 DIAGNOSIS — Z9114 Patient's other noncompliance with medication regimen: Secondary | ICD-10-CM

## 2016-10-11 DIAGNOSIS — Z85528 Personal history of other malignant neoplasm of kidney: Secondary | ICD-10-CM | POA: Insufficient documentation

## 2016-10-11 DIAGNOSIS — S0990XD Unspecified injury of head, subsequent encounter: Secondary | ICD-10-CM

## 2016-10-11 DIAGNOSIS — E559 Vitamin D deficiency, unspecified: Secondary | ICD-10-CM | POA: Insufficient documentation

## 2016-10-11 DIAGNOSIS — J320 Chronic maxillary sinusitis: Secondary | ICD-10-CM | POA: Insufficient documentation

## 2016-10-11 DIAGNOSIS — E119 Type 2 diabetes mellitus without complications: Secondary | ICD-10-CM

## 2016-10-11 DIAGNOSIS — M5382 Other specified dorsopathies, cervical region: Secondary | ICD-10-CM | POA: Insufficient documentation

## 2016-10-11 DIAGNOSIS — Z8546 Personal history of malignant neoplasm of prostate: Secondary | ICD-10-CM | POA: Insufficient documentation

## 2016-10-11 DIAGNOSIS — E876 Hypokalemia: Secondary | ICD-10-CM | POA: Insufficient documentation

## 2016-10-11 HISTORY — DX: Personal history of other specified conditions: Z87.898

## 2016-10-11 HISTORY — DX: Other specified disorders of kidney and ureter: N28.89

## 2016-10-11 HISTORY — DX: Spinal stenosis, cervical region: M48.02

## 2016-10-11 HISTORY — DX: Central cord syndrome at C5 level of cervical spinal cord, initial encounter: S14.125A

## 2016-10-11 LAB — LIPID PANEL
CHOL/HDL RATIO: 3.3 ratio
CHOLESTEROL: 210 mg/dL — AB (ref 0–200)
HDL: 63 mg/dL (ref >40)
LDL Cholesterol: 120 mg/dL — ABNORMAL HIGH (ref 0–99)
Triglycerides: 136 mg/dL (ref <150)
VLDL: 27 mg/dL (ref 0–40)

## 2016-10-11 LAB — CBC
HEMATOCRIT: 37.4 % — AB (ref 39.0–52.0)
HEMOGLOBIN: 12.7 g/dL — AB (ref 13.0–17.0)
MCH: 31.4 pg (ref 26.0–34.0)
MCHC: 34 g/dL (ref 30.0–36.0)
MCV: 92.6 fL (ref 78.0–100.0)
Platelets: 255 10*3/uL (ref 150–400)
RBC: 4.04 MIL/uL — ABNORMAL LOW (ref 4.22–5.81)
RDW: 13.5 % (ref 11.5–15.5)
WBC: 6.6 10*3/uL (ref 4.0–10.5)

## 2016-10-11 LAB — BASIC METABOLIC PANEL
ANION GAP: 5 (ref 5–15)
BUN: 12 mg/dL (ref 6–20)
CO2: 27 mmol/L (ref 22–32)
Calcium: 8.9 mg/dL (ref 8.9–10.3)
Chloride: 104 mmol/L (ref 101–111)
Creatinine, Ser: 0.98 mg/dL (ref 0.61–1.24)
GFR calc Af Amer: >60 mL/min (ref >60)
GFR calc non Af Amer: >60 mL/min (ref >60)
GLUCOSE: 124 mg/dL — AB (ref 65–99)
POTASSIUM: 3.4 mmol/L — AB (ref 3.5–5.1)
Sodium: 136 mmol/L (ref 135–145)

## 2016-10-11 LAB — TSH: TSH: 1.908 u[IU]/mL (ref 0.350–4.500)

## 2016-10-11 LAB — GLUCOSE, CAPILLARY: GLUCOSE-CAPILLARY: 170 mg/dL — AB (ref 65–99)

## 2016-10-11 LAB — CBG MONITORING, ED
Glucose-Capillary: 147 mg/dL — ABNORMAL HIGH (ref 65–99)
Glucose-Capillary: 141 mg/dL — ABNORMAL HIGH (ref 65–99)

## 2016-10-11 MED ORDER — ALBUTEROL SULFATE (2.5 MG/3ML) 0.083% IN NEBU: 3.0000 mL | INHALATION_SOLUTION | Freq: Four times a day (QID) | RESPIRATORY_TRACT | Status: DC | PRN

## 2016-10-11 MED ORDER — ENSURE ENLIVE PO LIQD
237.0000 mL | Freq: Two times a day (BID) | ORAL | Status: DC
  Administered 2016-10-12 – 2016-10-13 (×3): 237 mL via ORAL

## 2016-10-11 MED ORDER — INFLUENZA VAC SPLIT QUAD 0.5 ML IM SUSY
0.5000 mL | PREFILLED_SYRINGE | INTRAMUSCULAR | Status: DC
  Filled 2016-10-11: qty 0.5

## 2016-10-11 MED ORDER — ONDANSETRON HCL 4 MG/2ML IJ SOLN: 4.0000 mg | Freq: Four times a day (QID) | INTRAMUSCULAR | Status: DC | PRN

## 2016-10-11 MED ORDER — LORAZEPAM 2 MG/ML IJ SOLN
1.0000 mg | Freq: Once | INTRAMUSCULAR | Status: AC
  Administered 2016-10-11: 1 mg via INTRAVENOUS
  Filled 2016-10-11: qty 1

## 2016-10-11 MED ORDER — GABAPENTIN 600 MG PO TABS
300.0000 mg | ORAL_TABLET | Freq: Three times a day (TID) | ORAL | Status: DC
  Administered 2016-10-11 – 2016-10-12 (×4): 300 mg via ORAL
  Filled 2016-10-11 (×4): qty 1

## 2016-10-11 MED ORDER — POLYETHYLENE GLYCOL 3350 17 G PO PACK: 17.0000 g | PACK | Freq: Every day | ORAL | Status: DC | PRN

## 2016-10-11 MED ORDER — AMLODIPINE BESYLATE 10 MG PO TABS
10.0000 mg | ORAL_TABLET | Freq: Two times a day (BID) | ORAL | Status: DC
  Administered 2016-10-12 – 2016-10-13 (×2): 10 mg via ORAL
  Filled 2016-10-11 (×3): qty 1

## 2016-10-11 MED ORDER — RIVAROXABAN 20 MG PO TABS
20.0000 mg | ORAL_TABLET | Freq: Every day | ORAL | Status: DC
  Administered 2016-10-11 – 2016-10-13 (×3): 20 mg via ORAL
  Filled 2016-10-11 (×3): qty 1

## 2016-10-11 MED ORDER — ACETAMINOPHEN 325 MG PO TABS
650.0000 mg | ORAL_TABLET | Freq: Four times a day (QID) | ORAL | Status: DC | PRN
  Administered 2016-10-11 – 2016-10-12 (×2): 650 mg via ORAL
  Filled 2016-10-11 (×2): qty 2

## 2016-10-11 MED ORDER — POTASSIUM CHLORIDE CRYS ER 20 MEQ PO TBCR
20.0000 meq | EXTENDED_RELEASE_TABLET | Freq: Two times a day (BID) | ORAL | Status: DC
  Administered 2016-10-11 – 2016-10-13 (×4): 20 meq via ORAL
  Filled 2016-10-11 (×4): qty 1

## 2016-10-11 MED ORDER — SODIUM CHLORIDE 0.9% FLUSH
3.0000 mL | Freq: Two times a day (BID) | INTRAVENOUS | Status: DC
  Administered 2016-10-11 – 2016-10-13 (×4): 3 mL via INTRAVENOUS

## 2016-10-11 MED ORDER — ONDANSETRON HCL 4 MG PO TABS: 4.0000 mg | ORAL_TABLET | Freq: Four times a day (QID) | ORAL | Status: DC | PRN

## 2016-10-11 MED ORDER — MONTELUKAST SODIUM 10 MG PO TABS
10.0000 mg | ORAL_TABLET | Freq: Every day | ORAL | Status: DC
  Administered 2016-10-11 – 2016-10-12 (×2): 10 mg via ORAL
  Filled 2016-10-11 (×2): qty 1

## 2016-10-11 MED ORDER — GADOBENATE DIMEGLUMINE 529 MG/ML IV SOLN
20.0000 mL | Freq: Once | INTRAVENOUS | Status: AC | PRN
  Administered 2016-10-11: 20 mL via INTRAVENOUS

## 2016-10-11 MED ORDER — ACETAMINOPHEN 650 MG RE SUPP: 650.0000 mg | Freq: Four times a day (QID) | RECTAL | Status: DC | PRN

## 2016-10-11 NOTE — ED Provider Notes (Signed)
[facility] DEPT Provider Note   CSN: [id number] Arrival date & time: 10/11/16  1307     History   Chief Complaint Chief Complaint  Patient presents with  . Loss of Consciousness  . Atrial Fibrillation    HPI [name] [name] is a 71 y.o. male.  HPI This is a 71 year old man history of diabetes, coronary artery disease, aortic insufficiency who reports an episode of syncope today. He was sitting at the table and felt okay when he awoke he was face down. An abrasion to his forehead with some pain in the forehead but no headache. He states both his hands hurt feeling like they are tingling.[name] He denies any neck pain. He denies any weakness. Past Medical History:  Diagnosis Date  . Anxiety   . Aortic root dilatation ([place])    a. 12/2015 Echo: mod dil of sinus of valsalva and Ao root (28m).  .Marland KitchenCAD (coronary artery disease)    a. 12/2015 NSTEMI/Cath (in setting of PAF):  LM nl, LAD 30p,  LCX 378mRCA  ok, AM 100%, RPDA1 40, RPDA 2 60 ->Med Rx.  . Childhood asthma   . Chronic diastolic CHF (congestive heart failure) (HCN[place]   a. 12/2015 Echo: EF 50-55%, mod AI, mod Ao root dil, mild MR, mod dil LA, mod RA.  . Marland KitchenOPD (chronic obstructive pulmonary disease) (HCMerriam  . Diabetes mellitus type II    diet controlled  . Hyperlipidemia   . Hypertensive heart disease   . Moderate aortic insufficiency    a. 12/2015 Echo: Mod AI.  . Marland Kitchenaroxysmal atrial fibrillation (HCF[place]   a. 12/2015 started on Xarelto (CHA2DS2VASc = 4-5).  . Pneumonia    "2-3 times" (10/17/2015)  . [facility] ([facility]   "doctor saw a spot on my prostate"   . Renal cell carcinoma (HCPrinceton   I do not see confirmation of this diagnosis.  He sees a urologist currently.    . Sleep apnea    "don't use mask since I lost weight" (10/17/2015)  . Syncope     Patient Active Problem List   Diagnosis Date Noted  . CAD (coronary artery disease)   . Chronic diastolic CHF (congestive heart failure) (HCLiberty  . Hyperlipidemia   . Hypertensive  heart disease   . Paroxysmal atrial fibrillation (HCLake Andes04/16/2017  . Multiple myeloma (HCFiddletown04/16/2017  . Unstable angina (HCDenton04/15/2017  . Asthma exacerbation 10/18/2015  . Mild intermittent asthma 10/18/2015  . Aortic root dilatation (HCSlayden02/14/2017  . Diabetes (HCGalena02/14/2017  . 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 Cervical pain 12/09/2014  . Hand paresthesia 12/09/2014  . Renal lesion   . Headache 11/03/2014  . PND (post-nasal drip) 11/03/2014  . Headache, temporal 09/30/2014  . Elevated prostate specific antigen (PSA) 07/12/2014  . Allergic rhinitis 07/12/2014  . LBP (low back pain) 07/12/2014  . Palpitation 07/11/2014  . Awareness of heartbeats 07/11/2014  . Blood glucose elevated 04/05/2014  . Patient's other noncompliance with medication regimen 08/10/2013  . Cellulitis 04/28/2013  . Chronic venous insufficiency 07/23/2012  . Auditory disturbance 02/25/2012  . Stasis, venous 01/30/2012  . Decreased potassium in the blood 06/28/2011  . Athlete's foot 04/11/2011  . Obstructive apnea 04/11/2011  . RAD (reactive airway disease) 04/11/2011  . HLD (hyperlipidemia) 09/09/2010  . Abnormal blood sugar 09/07/2010  . Essential (primary) hypertension 09/07/2010  . Avitaminosis D 07/12/2010  . Hypoglycemia 06/20/2010  . ED (erectile dysfunction) of organic origin  06/20/2010  . Anxiety, generalized 04/04/2010  . Kidney lump 04/04/2010  . Cardiac conduction disorder 02/27/2010  . Pruritic dermatitis 11/10/2009  . Benign prostatic hypertrophy without urinary obstruction 09/13/2009  . Abnormal findings on examination of genitourinary organs 08/09/2009  . Hereditary and idiopathic neuropathy 07/05/2009  . Excessive urination at night 02/23/2008    Past Surgical History:  Procedure Laterality Date  . BACK SURGERY    . CARDIAC CATHETERIZATION N/A 12/18/2015   Procedure: Left Heart Cath and Coronary Angiography;  Surgeon: Leonie Man, MD;   Location: [facility];  Service: Cardiovascular;  Laterality: N/A;  . LUMBAR [place]   "replaced a disc"       Home Medications    Prior to Admission medications   Medication Sig Start Date End Date Taking? Authorizing Provider  albuterol (PROVENTIL HFA;VENTOLIN HFA) 108 (90 Base) MCG/ACT inhaler Inhale 2 puffs into the lungs every 6 (six) hours as needed for wheezing or shortness of breath. 10/18/15   Alexa Angela Burke, MD  albuterol (PROVENTIL) (5 MG/ML) 0.5% nebulizer solution Take 0.5 mLs (2.5 mg total) by nebulization every 6 (six) hours as needed for wheezing or shortness of breath. 10/18/15   Florinda Marker, MD  amLODipine (NORVASC) 10 MG tablet Take 10 mg by mouth 2 (two) times daily.  12/25/11   Historical Provider, MD  atorvastatin (LIPITOR) 20 MG tablet Take 1 tablet (20 mg total) by mouth at bedtime. Patient not taking: Reported on 04/26/2016 12/20/15   Ripudeep Krystal Eaton, MD  furosemide (LASIX) 20 MG tablet Take 1 tablet (20 mg total) by mouth daily. Patient not taking: Reported on 04/26/2016 01/02/16   Rogelia Mire, NP  KLOR-CON M20 20 MEQ tablet TAKE 1 TABLET TWICE A DAY 09/04/16   Minus Breeding, MD  metoprolol tartrate (LOPRESSOR) 25 MG tablet Take 1 tablet (25 mg total) by mouth 2 (two) times daily. Patient not taking: Reported on 04/26/2016 12/20/15   Ripudeep Krystal Eaton, MD  rivaroxaban (XARELTO) 20 MG TABS tablet Take 1 tablet (20 mg total) by mouth daily with supper. Patient not taking: Reported on 04/26/2016 12/20/15   Ripudeep Krystal Eaton, MD    Family History Family History  Problem Relation Age of Onset  . Emphysema Father   . Asthma Father   . Heart disease Mother     No details.  Still alive age 71    Social History Social History  Substance Use Topics  . Smoking status: Former Smoker    Types: Cigars  . Smokeless tobacco: Never Used     Comment: "quit smoking in the 1970s"  . Alcohol use Yes     Comment: "stopped drinking alcohol in ~ 1980; just drank a  little on the weekends when I did drink"     Allergies   Amoxicillin and Penicillins   Review of Systems Review of Systems  All other systems reviewed and are negative.    Physical Exam Updated Vital Signs BP 137/82   Pulse (!) 51   Temp 97.6 F (36.4 C) (Oral)   Resp 16   Ht '5\' 6"'$  (1.676 m)   Wt 105.7 kg   SpO2 98%   BMI 37.61 kg/m   Physical Exam  Constitutional: He is oriented to person, place, and time. He appears well-developed and well-nourished. No distress.  HENT:  Head: Normocephalic and atraumatic.    Right Ear: External ear normal.  Left Ear: External ear normal.  Mouth/Throat: Oropharynx is clear and  moist.  Eyes: EOM are normal. Pupils are equal, round, and reactive to light.  Neck: Normal range of motion. Neck supple.  Cardiovascular: Normal rate and regular rhythm.   Pulmonary/Chest: Effort normal and breath sounds normal.  Abdominal: Soft. Bowel sounds are normal.  Musculoskeletal: Normal range of motion.  Neurological: He is alert and oriented to person, place, and time.  Skin: Skin is warm. Capillary refill takes less than 2 seconds.  Psychiatric: He has a normal mood and affect. His behavior is normal.  Nursing note and vitals reviewed.    ED Treatments / Results  Labs (all labs ordered are listed, but only abnormal results are displayed) Labs Reviewed  BASIC METABOLIC PANEL - Abnormal; Notable for the following:       Result Value   Potassium 3.4 (*)    Glucose, Bld 124 (*)    All other components within normal limits  CBC - Abnormal; Notable for the following:    RBC 4.04 (*)    Hemoglobin 12.7 (*)    HCT 37.4 (*)    All other components within normal limits  URINALYSIS, ROUTINE W REFLEX MICROSCOPIC  CBG MONITORING, ED    EKG  EKG Interpretation  Date/Time:  Friday October 11 2016 13:25:26 EST Ventricular Rate:  55 PR Interval:    QRS Duration: 119 QT Interval:  452 QTC Calculation: 433 R Axis:   -58 Text  Interpretation:  Sinus rhythm Atrial premature complex Left anterior fascicular block Nonspecific T abnormalities, inferior leads Confirmed by Anahlia Iseminger MD, Duwayne Heck ([id number]) on 10/11/2016 1:45:36 PM Also confirmed by Jerrett Baldinger MD, Duwayne Heck [phone]), editor Misty Stanley [phone])  on 10/11/2016 2:10:08 PM       Radiology Ct Head Wo Contrast  Result Date: 10/11/2016 CLINICAL DATA:  Syncopal episode.  Fell. EXAM: CT HEAD WITHOUT CONTRAST CT CERVICAL SPINE WITHOUT CONTRAST TECHNIQUE: Multidetector CT imaging of the head and cervical spine was performed following the standard protocol without intravenous contrast. Multiplanar CT image reconstructions of the cervical spine were also generated. COMPARISON:  CT scan 07/03/2014 FINDINGS: CT HEAD FINDINGS Brain: Stable age related cerebral atrophy, ventriculomegaly and periventricular white matter disease. No extra-axial fluid collections are identified. No CT findings for acute hemispheric infarction or intracranial hemorrhage. No mass lesions. The brainstem and cerebellum are normal. Vascular: Stable scattered vascular calcifications. No aneurysm or hyperdense vessels. Skull: No skull fracture or bone lesion. Sinuses/Orbits: Moderate fluid noted in the left maxillary sinus which may suggest acute sinusitis. No sinus wall fractures. The paranasal sinuses and mastoid air cells are otherwise clear. Other: No scalp lesion or hematoma.  The globes are intact. CT CERVICAL SPINE FINDINGS Alignment: Grossly normal. Straightening of the normal cervical lordosis. Advanced degenerative cervical spondylosis with multilevel disc disease and facet disease. Skull base and vertebrae: No acute fracture. Soft tissues and spinal canal: No abnormal prevertebral soft tissue swelling. Disc levels: Multilevel disc disease and facet disease but no significant spinal stenosis. Mild to moderate multilevel foraminal stenosis. Upper chest: The visualized lung apices are grossly clear. Other: None  IMPRESSION: 1. No acute intracranial findings or skull fracture. 2. Stable age related cerebral atrophy, ventriculomegaly and periventricular white matter disease. 3. Left maxillary sinus disease. 4. Advanced degenerative cervical spondylosis with multilevel disc disease and facet disease but no acute cervical spine fracture. Electronically Signed   By: Rudie Meyer M.D.   On: 10/11/2016 14:38   Ct Cervical Spine Wo Contrast  Result Date: 10/11/2016 CLINICAL DATA:  Syncopal episode.  Fell. EXAM: CT HEAD  WITHOUT CONTRAST CT CERVICAL SPINE WITHOUT CONTRAST TECHNIQUE: Multidetector CT imaging of the head and cervical spine was performed following the standard protocol without intravenous contrast. Multiplanar CT image reconstructions of the cervical spine were also generated. COMPARISON:  CT scan 07/03/2014 FINDINGS: CT HEAD FINDINGS Brain: Stable age related cerebral atrophy, ventriculomegaly and periventricular white matter disease. No extra-axial fluid collections are identified. No CT findings for acute hemispheric infarction or intracranial hemorrhage. No mass lesions. The brainstem and cerebellum are normal. Vascular: Stable scattered vascular calcifications. No aneurysm or hyperdense vessels. Skull: No skull fracture or bone lesion. Sinuses/Orbits: Moderate fluid noted in the left maxillary sinus which may suggest acute sinusitis. No sinus wall fractures. The paranasal sinuses and mastoid air cells are otherwise clear. Other: No scalp lesion or hematoma.  The globes are intact. CT CERVICAL SPINE FINDINGS Alignment: Grossly normal. Straightening of the normal cervical lordosis. Advanced degenerative cervical spondylosis with multilevel disc disease and facet disease. Skull base and vertebrae: No acute fracture. Soft tissues and spinal canal: No abnormal prevertebral soft tissue swelling. Disc levels: Multilevel disc disease and facet disease but no significant spinal stenosis. Mild to moderate multilevel  foraminal stenosis. Upper chest: The visualized lung apices are grossly clear. Other: None IMPRESSION: 1. No acute intracranial findings or skull fracture. 2. Stable age related cerebral atrophy, ventriculomegaly and periventricular white matter disease. 3. Left maxillary sinus disease. 4. Advanced degenerative cervical spondylosis with multilevel disc disease and facet disease but no acute cervical spine fracture. Electronically Signed   By: Marijo Sanes M.D.   On: 10/11/2016 14:38   Dg Hand Complete Left  Result Date: 10/11/2016 CLINICAL DATA:  Fall.  Anticoagulated. EXAM: LEFT HAND - COMPLETE 3+ VIEW COMPARISON:  None. FINDINGS: Peripheral intravenous catheter overlies the left wrist and metacarpals. No fracture or dislocation. No suspicious focal osseous lesion. 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 No appreciable erosive arthropathy. Enthesophyte at the distal tuft of the distal phalanx in the left fourth finger. IMPRESSION: No fracture or dislocation. Electronically Signed   By: Ilona Sorrel M.D.   On: 10/11/2016 14:41    Procedures Procedures (including critical care time)  Medications Ordered in ED Medications - No data to display   Initial Impression / Assessment and Plan / ED Course  I have reviewed the triage vital signs and the nursing notes.  Pertinent labs & imaging results that were available during my care of the patient were reviewed by me and considered in my medical decision making (see chart for details).    Patient with syncopal episode , and hand pain after fall.  No obvious injury to hands, with pain bilaterally- given patient's osteophytes, concern for spinal cord injury.  Family practice consulted for admission.  Neurosurgery consulted.  Discussed with Mr. Cato Mulligan, plan MRI, and neurosurgery will see.    Final Clinical Impressions(s) / ED Diagnoses   Final diagnoses:  Syncope, unspecified syncope type  Paresthesia of both hands     New Prescriptions New Prescriptions   No medications on file     Pattricia Boss, MD 10/11/16 [phone]

## 2016-10-11 NOTE — Consult Note (Signed)
Patient presented to ER for syncopal episode and N/T of hands  CT spine ordered. Found to have advanced cervical spondylosis with multi level disc disease Consult ordered by Dr Jeanell Sparrow for possible Central cord syndrome due to advanced cervical spine changes and numbness in hands. MRI was ordered and without evidence of central cord syndrome. This was reviewed and discussed with Dr Cyndy Freeze.   Pt seen and examined. Patient reports numnbess in right hand but otherwise feels well Endorses full range of motion. Denies decrease in strength Denies Ha, dizziness, loss of bowel control/bladder dysfunction. No motor/sensory deficits Admitted by ER for management of syncopal episode.  EXAM: Temp:  [97.6 F (36.4 C)] 97.6 F (36.4 C) (02/09 1317) Pulse Rate:  [48-58] 58 (02/09 1600) Resp:  [11-19] 16 (02/09 1600) BP: (112-137)/(76-84) 127/83 (02/09 1600) SpO2:  [97 %-99 %] 97 % (02/09 1600) Weight:  [105.7 kg (233 lb)] 105.7 kg (233 lb) (02/09 1318) Intake/Output    None    Awake and alert Follows commands throughout Full strength CN grossly intact  Impression Cervical spondylosis with mild-moderate spinal stenosis. Severe herniated discs.  No evidence of central cord syndrome  Plan  rec starting on Gabapentin Cleared from a neurosurgical standpoint Call for any concerns.

## 2016-10-11 NOTE — H&P (Signed)
[facility] Admission History and Physical Service Pager: [phone]  Patient name: [name] [name] Medical record number: [id number] Date of birth: 1946/08/28 Age: 71 y.o. Gender: male  Primary Care Provider: Vicenta Aly, FNP Consultants: neurosurgery Code Status: FULL  Chief Complaint: syncope  Assessment and Plan: [name] [name] is a 71 y.o. male presenting with syncope and hand numbness. PMH is significant for paroxysmal atrial fibrillation, palpitations, chronic venous insufficiency, headache, generalized anxiety, hypokalemia, ED, elevated blood glucose, obstructive apnea, HLD, HTN, asthma, CAD, diastolic HF.   Syncope- Vital signs within normal limits besides bradycardia into the 50's. BMP showing slightly low K of 3.4 and elevated glucose to 124,otherwise unremarkable. CBC showing no anemia or leukocytosis. EKG on admission showing sinus rhythm but with some premature atrial complexes and some t wave changes. CT Head showing no bleed or acute findings. Per patient, syncope occurs several times a week, always after eating. Has happened for what sounds like well over a year. No prodrome. Leading differential is postprandial syncope. Cardiogenic vs neurogenic vs psychogenic etiology.  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 Patient had been seen outpatient recently in December 2017 and given event monitor but unclear if he has followed up for this. -place in observation, attending Dr. McDiarmid -monitor on telemetry - trend troponins -vitals per floor -orthostatic vital signs -Up with assistance -echocardiogram -consult cardiology in morning -am CBC and BMP, Mag, Phos -PT/OT consult - Risk stratification labs: Hgb A1C, lipid panel, TSH  Hand paresthesias- started today after syncopal event. CT spine with evidence of DJD and osteophytes. ED provider consulted neurosurgery.  -neurosurgery  following, appreciate recommendations. Do not recommend surgical intervention at this time. -gabapentin 300 mg TID -tylenol for pain  PAF- patient on xarelto at home but reports not taking this medication for some time until recently -continue xarelto -monitor on telemetry  Hx of CAD, CHF, unstable angina, and aortic root dilatation and insufficiency: Follows with Dr. Percival Spanish at [facility] heart care. Had a cardiac cath in cardiac cath 4/17 showed OM 100% stenosed, RPDA-2 lesion, 60% stenosedRPDA-1 lesion, 40% stenosed. Medical management was recommended. 12/2015 Echo: EF 50-55%, mod AI, mod Ao root dil, mild MR, mod dil LA, mod RA. - Cardiology consult in the AM - Continue xarelto and amlodipine  Hypokalemia- takes potassium as outpatient. Potassium 3.4 on admission -Continue home KDUR of 20 meq BID -monitor BMP  Diabetes/elevated blood glucose- patient unsure of if he has diabetes but reports having high sugars in past. Last A1C April 2017 at 5.5 -check A1C -CBGS AC/HS  Exophthalmos- noted on exam. Could be due to longstanding OSA without CPAP use -check TSH  HTN- takes Norvasc 10 mg at home, BP 126/75 in ED -continue home medications -monitor BP  HLD- on problem list but not currently on statin -check lipid panel -consider starting statin  Asthma or COPD- patient has albuterol inhaler that he uses "twice a year" and denies having asthma or COPD. Faint wheezes on exam. -albuterol neb -continue singulair  OSA- patient instructed to use CPAP nightly but does not do so due to being claustrophobic  -cpap as needed every night although patient refuses this  FEN/GI: regular diet Prophylaxis: on xarelto  Disposition: pending clinical improvement  History of Present Illness:  [name] [name] is a 71 y.o. male presenting with a syncopal episode this morning.   Patient states that he was eating breakfast and fell and hit  his head. His son notes that he faints after eating "for a  while now". Patient reports this has been going on for a couple years but he didn't tell anyone. Patient notes that sometimes he "blacks out" every other day always after eating. He denies any syncopal episodes when he's standing up. The episodes only happen when he's sitting down. After eating breakfast this morning he blacked out and fell and hit his head on the floor. He lost consciousness for unknown amount of time but patient feels that it wasn't very long. He called out for help once he came to on the floor. A maintenance worker was able to get into his apartment and call EMS. Paramedics brought him to the ED. He felt confused after passing out and was unable to get up on his own. He states that since this episode his hands have been tingling and very painful. This happened suddenly like a "jolt of pain". Per family in room, patient is narcoleptic and falls asleep very quickly. 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 He also has sleep apnea but refuses to wear CPAP at night because it "suffocates him" and he is very claustrophobic.  Patient denies having any jerking movements during these syncopal episodes, denies bowel or bladder incontinence. He does report that he has had seizures in the past but states he has never told his family. He has bitten his tongue in the past. He currently lives in a senior assisted living facility. He recently saw a doctor and told them about the syncope. This doctor gave him a monitor to wear for 30 days but he did not wear it consistently and he did not follow up with the results yet. He states he has low potassium and his doctor called him yesterday to tell him it was low and he needs to take potassium. Patient reports feeling weak yesterday and today. See ROS below.   Review Of Systems: Per HPI with the following additions:  Review of Systems  Constitutional: Negative for chills and fever.  Eyes: Negative for blurred vision and double vision.  Respiratory:  Positive for wheezing. Negative for cough.   Cardiovascular: Negative for chest pain and leg swelling.  Gastrointestinal: Negative for abdominal pain, constipation, diarrhea, nausea and vomiting.  Genitourinary: Negative for dysuria.  Musculoskeletal: Positive for falls.  Neurological: Positive for tingling and loss of consciousness. Negative for tremors, sensory change, speech change, weakness and headaches.  Psychiatric/Behavioral: Negative for substance abuse.    Patient Active Problem List   Diagnosis Date Noted  . Syncope 10/11/2016  . CAD (coronary artery disease)   . Chronic diastolic CHF (congestive heart failure) ([place])   . Hyperlipidemia   . Hypertensive heart disease   . Paroxysmal atrial fibrillation ([place]) 12/17/2015  . Multiple myeloma ([place]) 12/17/2015  . Unstable angina ([place]) 12/16/2015  . Asthma exacerbation 10/18/2015  . Mild intermittent asthma 10/18/2015  . Aortic root dilatation ([place]) 10/17/2015  . Diabetes ([place]) 10/17/2015  . 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 Cervical pain 12/09/2014  . Hand paresthesia 12/09/2014  . Renal lesion   . Headache 11/03/2014  . PND (post-nasal drip) 11/03/2014  . Headache, temporal 09/30/2014  . Elevated prostate specific antigen (PSA) 07/12/2014  . Allergic rhinitis 07/12/2014  . LBP (low back pain) 07/12/2014  . Palpitation 07/11/2014  . Awareness of heartbeats 07/11/2014  . Blood glucose elevated 04/05/2014  . Patient's other noncompliance with medication regimen 08/10/2013  . Cellulitis 04/28/2013  .  Chronic venous insufficiency 07/23/2012  . Auditory disturbance 02/25/2012  . Stasis, venous 01/30/2012  . Decreased potassium in the blood 06/28/2011  . Athlete's foot 04/11/2011  . Obstructive apnea 04/11/2011  . RAD (reactive airway disease) 04/11/2011  . HLD (hyperlipidemia) 09/09/2010  . Abnormal blood sugar 09/07/2010  . Essential (primary) hypertension 09/07/2010  . Avitaminosis D  07/12/2010  . Hypoglycemia 06/20/2010  . 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 Anxiety, generalized 04/04/2010  . Kidney lump 04/04/2010  . Cardiac conduction disorder 02/27/2010  . Pruritic dermatitis 11/10/2009  . Benign prostatic hypertrophy without urinary obstruction 09/13/2009  . Abnormal findings on examination of genitourinary organs 08/09/2009  . Hereditary and idiopathic neuropathy 07/05/2009  . Excessive urination at night 02/23/2008    Past Medical History: Past Medical History:  Diagnosis Date  . Anxiety   . Aortic root dilatation ([place])    a. 12/2015 Echo: mod dil of sinus of valsalva and Ao root (34m).  .Marland KitchenCAD (coronary artery disease)    a. 12/2015 NSTEMI/Cath (in setting of PAF):  LM nl, LAD 30p,  LCX 361mRCA  ok, AM 100%, RPDA1 40, RPDA 2 60 ->Med Rx.  . Childhood asthma   . Chronic diastolic CHF (congestive heart failure) ([facility]   a. 12/2015 Echo: EF 50-55%, mod AI, mod Ao root dil, mild MR, mod dil LA, mod RA.  . Marland KitchenOPD (chronic obstructive pulmonary disease) ([place]  . Diabetes mellitus type II    diet controlled  . Hyperlipidemia   . Hypertensive heart disease   . Moderate aortic insufficiency    a. 12/2015 Echo: Mod AI.  . Marland Kitchenaroxysmal atrial fibrillation ([place]   a. 12/2015 started on Xarelto (CHA2DS2VASc = 4-5).  . Pneumonia    "2-3 times" (10/17/2015)  . [facility]   "doctor saw a spot on my prostate"   . Renal cell carcinoma ([place]   I do not see confirmation of this diagnosis.  He sees a urologist currently.    . Sleep apnea    "don't use mask since I lost weight" (10/17/2015)  . Syncope     Past Surgical History: Past Surgical History:  Procedure Laterality Date  . BACK SURGERY    . CARDIAC CATHETERIZATION N/A 12/18/2015   Procedure: Left Heart Cath and Coronary Angiography;  Surgeon: DaLeonie ManMD;  [facility];  Service: Cardiovascular;  Laterality: N/A;  . LUMBAR DIB[place]kins "replaced  a disc"    Social History: Social History  Substance Use Topics  . Smoking status: Former Smoker    Types: Cigars  . Smokeless tobacco: Never Used     Comment: "quit smoking in the 1970s"  . Alcohol use Yes     Comment: "stopped drinking alcohol in ~ 1980; just drank a little on the weekends when I did drink"   Additional social history: lives in ALF- [facility] Please also refer to relevant sections of EMR.  Family History: Family History  Problem Relation Age of Onset  . Emphysema Father   . Asthma Father   . Heart disease Mother     No details.  Still alive age 73  Allergies and Medications: Allergies  Allergen Reactions  . Amoxicillin Other (See Comments)    Can't move Has patient had a PCN reaction causing immediate rash, facial/tongue/throat swelling, SOB or lightheadedness with hypotension: No Has patient had a PCN reaction causing severe rash involving mucus  membranes or skin necrosis: No Has patient had a PCN reaction that required hospitalization No Has patient had a PCN reaction occurring within the last 10 years: No If all of the above answers are "NO", then may proceed with Cephalosporin use.   [name] Penicillins Other (See Comments)    Can't move/dizziness Has patient had a PCN reaction causing immediate rash, facial/tongue/throat swelling, SOB or lightheadedness with hypotension: No Has patient had a PCN reaction causing severe rash involving mucus membranes or skin necrosis: No Has patient had a PCN reaction that required hospitalization No Has patient had a PCN reaction occurring within the last 10 years: No If all of the above answers are "NO", then may proceed with Cephalosporin use.    No current facility-administered medications on file prior to encounter.    Current Outpatient Prescriptions on File Prior to Encounter  Medication Sig Dispense Refill  . albuterol (PROVENTIL HFA;VENTOLIN HFA) 108 (90 Base) MCG/ACT inhaler Inhale 2 puffs into the lungs  every 6 (six) hours as needed for wheezing or shortness of breath. 1 Inhaler 3  . albuterol (PROVENTIL) (5 MG/ML) 0.5% nebulizer solution Take 0.5 mLs (2.5 mg total) by nebulization every 6 (six) hours as needed for wheezing or shortness of breath. 20 mL 12  . amLODipine (NORVASC) 10 MG tablet Take 10 mg by mouth 2 (two) times daily.     [name] KLOR-CON M20 20 MEQ tablet TAKE 1 TABLET TWICE A DAY 60 tablet 6  . rivaroxaban (XARELTO) 20 MG TABS tablet Take 1 tablet (20 mg total) by mouth daily with supper. 30 tablet 3    Objective: BP 126/75 (BP Location: Right Arm)   Pulse (!) 54   Temp 97.9 F (36.6 C) (Oral)   Resp 16   Ht _0  (1.753 m)   Wt 239 lb 11.2 oz (108.7 kg)   SpO2 98%   BMI 35.40 kg/m  Exam: General: elderly man laying in bed in NAD Eyes: exopthalmos, EOMI. PERRLA ENTM: moist mucous membranes, poor dentition, no erythema or exudate noted Neck: supple, non-tender. No lymphadenopathy or thyromegaly Cardiovascular: RRR no MRG Respiratory: diffuse expiratory wheezes, no increased work of breathing Gastrointestinal: soft, nontender, nondistended. +BS MSK: warm, well perfused. Trace LE edema. No cyanosis Derm: warm, dry, no rashes noted. Two small healing ulcers on left lower leg Neuro: CN2-12 in tact, 5/5 strength, sensation in tact throughout Psych: appropriate mood and affect  Labs and Imaging: CBC BMET   Recent Labs Lab 10/11/16 1354  WBC 6.6  HGB 12.7*  HCT 37.4*  PLT 255    Recent Labs Lab 10/11/16 1354  NA 136  K 3.4*  CL 104  CO2 27  BUN 12  CREATININE 0.98  GLUCOSE 124*  CALCIUM 8.9     Ct Head Wo Contrast  Result Date: 10/11/2016 CLINICAL DATA:  Syncopal episode.  Fell. EXAM: CT HEAD WITHOUT CONTRAST CT CERVICAL SPINE WITHOUT CONTRAST TECHNIQUE: Multidetector CT imaging of the head and cervical spine was performed following the standard protocol without intravenous contrast. Multiplanar CT image reconstructions of the cervical spine were also  generated. COMPARISON:  CT scan 07/03/2014 FINDINGS: CT HEAD FINDINGS Brain: Stable age related cerebral atrophy, ventriculomegaly and periventricular white matter disease. No extra-axial fluid collections are identified. No CT findings for acute hemispheric infarction or intracranial hemorrhage. No mass lesions. The brainstem and cerebellum are normal. Vascular: Stable scattered vascular calcifications. No aneurysm or hyperdense vessels. Skull: No skull fracture or bone lesion. Sinuses/Orbits: Moderate fluid noted in the  left maxillary sinus which may suggest acute sinusitis. No sinus wall fractures. The paranasal sinuses and mastoid air cells are otherwise clear. Other: No scalp lesion or hematoma.  The globes are intact. CT CERVICAL SPINE FINDINGS Alignment: Grossly normal. Straightening of the normal cervical lordosis. Advanced degenerative cervical spondylosis with multilevel disc disease and facet disease. Skull base and vertebrae: No acute fracture. Soft tissues and spinal canal: No abnormal prevertebral soft tissue swelling. Disc levels: Multilevel disc disease and facet disease but no significant spinal stenosis. Mild to moderate multilevel foraminal stenosis. Upper chest: The visualized lung apices are grossly clear. Other: None IMPRESSION: 1. No acute intracranial findings or skull fracture. 2. Stable age related cerebral atrophy, ventriculomegaly and periventricular white matter disease. 3. Left maxillary sinus disease. 4. Advanced degenerative cervical spondylosis with multilevel disc disease and facet disease but no acute cervical spine fracture. Electronically Signed   By: Marijo Sanes M.D.   On: 10/11/2016 14:38   Ct Cervical Spine Wo Contrast  Result Date: 10/11/2016 CLINICAL DATA:  Syncopal episode.  Fell. EXAM: CT HEAD WITHOUT CONTRAST CT CERVICAL SPINE WITHOUT CONTRAST TECHNIQUE: Multidetector CT imaging of the head and cervical spine was performed following the standard protocol without  intravenous contrast. Multiplanar CT image reconstructions of the cervical spine were also generated. COMPARISON:  CT scan 07/03/2014 FINDINGS: CT HEAD FINDINGS Brain: Stable age related cerebral atrophy, ventriculomegaly and periventricular white matter disease. No extra-axial fluid collections are identified. No CT findings for acute hemispheric infarction or intracranial hemorrhage. No mass lesions. The brainstem and cerebellum are normal. Vascular: Stable scattered vascular calcifications. No aneurysm or hyperdense vessels. Skull: No skull fracture or bone lesion. Sinuses/Orbits: Moderate fluid noted in the left maxillary sinus which may suggest acute sinusitis. No sinus wall fractures. The paranasal sinuses and mastoid air cells are otherwise clear. Other: No scalp lesion or hematoma.  The globes are intact. CT CERVICAL SPINE FINDINGS Alignment: Grossly normal. Straightening of the normal cervical lordosis. Advanced degenerative cervical spondylosis with multilevel disc disease and facet disease. Skull base and vertebrae: No acute fracture. Soft tissues and spinal canal: No abnormal prevertebral soft tissue swelling. Disc levels: Multilevel disc disease and facet disease but no significant spinal stenosis. Mild to moderate multilevel foraminal stenosis. Upper chest: The visualized lung apices are grossly clear. Other: None IMPRESSION: 1. No acute intracranial findings or skull fracture. 2. Stable age related cerebral atrophy, ventriculomegaly and periventricular white matter disease. 3. Left maxillary sinus disease. 4. Advanced degenerative cervical spondylosis with multilevel disc disease and facet disease but no acute cervical spine fracture. Electronically Signed   By: Marijo Sanes M.D.   On: 10/11/2016 14:38   Mr Cervical Spine W Wo Contrast  Result Date: 10/11/2016 CLINICAL DATA:  Syncopal left-sided.  Neck pain. EXAM: MRI CERVICAL SPINE WITHOUT AND WITH CONTRAST TECHNIQUE: Multiplanar and multiecho  pulse sequences of the cervical spine, to include the craniocervical junction and cervicothoracic junction, were obtained without and with intravenous contrast. CONTRAST:  87m MULTIHANCE GADOBENATE DIMEGLUMINE 529 MG/ML IV SOLN COMPARISON:  None. FINDINGS: Patient motion degrades image quality limiting evaluation. Alignment: Physiologic. Vertebrae: No fracture, evidence of discitis, or bone lesion. Cord: Normal signal and morphology. Posterior Fossa, vertebral arteries, paraspinal tissues: Negative. Disc levels: Discs: Degenerative disc disease with disc height loss at C3-4, C4-5, C5-6 and C6-7. C2-3: No significant disc bulge. Moderate bilateral foraminal stenosis. No central canal stenosis. C3-4: Mild broad-based disc bulge. Bilateral uncovertebral degenerative changes. Severe left and moderate right foraminal stenosis. Mild spinal stenosis.  C4-5: Broad-based disc osteophyte complex with bilateral uncovertebral degenerative changes and severe bilateral foraminal stenosis. Moderate spinal stenosis. C5-6: Broad-based disc osteophyte complex effacing the ventral CSF space and contacting the ventral spinal cord. Bilateral uncovertebral degenerative changes. Moderate spinal stenosis. C6-7: Broad-based disc bulge. Bilateral uncovertebral degenerative changes. Moderate bilateral foraminal stenosis. Mild spinal stenosis. C7-T1: No significant disc bulge. No neural foraminal stenosis. No central canal stenosis. T1-2:  Mild bilateral facet arthropathy. IMPRESSION: 1. Diffuse cervical spine spondylosis as described above. 2. At C3-4 there is a mild broad-based disc bulge. Bilateral uncovertebral degenerative changes. Severe left and moderate right foraminal stenosis. Mild spinal stenosis. 3. At C4-5 there is a broad-based disc osteophyte complex with bilateral uncovertebral degenerative changes and severe bilateral foraminal stenosis. Moderate spinal stenosis. 4. At C5-6 there is a broad-based disc osteophyte complex  effacing the ventral CSF space and contacting the ventral spinal cord. Bilateral uncovertebral degenerative changes. Moderate spinal stenosis. 5. At C6-7 there is a broad-based disc bulge. Bilateral uncovertebral degenerative changes. Moderate bilateral foraminal stenosis. Mild spinal stenosis. Electronically Signed   By: Kathreen Devoid   On: 10/11/2016 17:16   Dg Hand Complete Left  Result Date: 10/11/2016 CLINICAL DATA:  Fall.  Anticoagulated. EXAM: LEFT HAND - COMPLETE 3+ VIEW COMPARISON:  None. FINDINGS: Peripheral intravenous catheter overlies the left wrist and metacarpals. No fracture or dislocation. No suspicious focal osseous lesion. 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 No appreciable erosive arthropathy. Enthesophyte at the distal tuft of the distal phalanx in the left fourth finger. IMPRESSION: No fracture or dislocation. Electronically Signed   By: Ilona Sorrel M.D.   On: 10/11/2016 14:41     Steve Rattler, DO 10/11/2016, 8:30 PM PGY-1, [place] Intern pager: [phone], text pages welcome  I have separately seen and examined the patient. I have discussed the findings and exam with Dr Vanetta Shawl and agree with the above note.  My changes/additions are outlined in BLUE.   Smitty Cords, MD [place], PGY-2

## 2016-10-11 NOTE — ED Triage Notes (Addendum)
Pt BIB EMS from home where pt reports feeling "fluttery" in his chest and "passed out." Pt in Afib with EMS. Reports feeling "tingly" in his hands. Per ems, pt began a stress test a couple weeks ago and was unable to complete. Pt hit left forehead; pt on blood thinners; bleeding controlled. A&O x 4; resp e/u; NAD noted at this time.

## 2016-10-11 NOTE — Progress Notes (Signed)
Patient admitted to room 7 from the Ed.  Arrived via stretcher.  Oriented to room and unit, call light use and meal times.  Denies any pain at this time.  Alert and oriented, able to voice needs.  No obvious distress.  Seen by neurology PA at bedside.  Multiple family members at the bedside.  Marcille Blanco, RN

## 2016-10-11 NOTE — ED Notes (Signed)
Pt still at MRI; will transport to unit upon return.

## 2016-10-11 NOTE — ED Notes (Signed)
Patient transported to MRI 

## 2016-10-11 NOTE — ED Notes (Signed)
Patient transported to CT 

## 2016-10-12 ENCOUNTER — Encounter (HOSPITAL_COMMUNITY): Payer: Self-pay | Admitting: Cardiology

## 2016-10-12 ENCOUNTER — Other Ambulatory Visit: Payer: Self-pay

## 2016-10-12 ENCOUNTER — Observation Stay (HOSPITAL_BASED_OUTPATIENT_CLINIC_OR_DEPARTMENT_OTHER): Payer: Medicare Other

## 2016-10-12 DIAGNOSIS — M47892 Other spondylosis, cervical region: Secondary | ICD-10-CM

## 2016-10-12 DIAGNOSIS — W19XXXA Unspecified fall, initial encounter: Secondary | ICD-10-CM

## 2016-10-12 DIAGNOSIS — I351 Nonrheumatic aortic (valve) insufficiency: Secondary | ICD-10-CM

## 2016-10-12 DIAGNOSIS — R002 Palpitations: Secondary | ICD-10-CM

## 2016-10-12 DIAGNOSIS — I2510 Atherosclerotic heart disease of native coronary artery without angina pectoris: Secondary | ICD-10-CM

## 2016-10-12 DIAGNOSIS — S0990XD Unspecified injury of head, subsequent encounter: Secondary | ICD-10-CM

## 2016-10-12 DIAGNOSIS — M4802 Spinal stenosis, cervical region: Secondary | ICD-10-CM

## 2016-10-12 DIAGNOSIS — I480 Paroxysmal atrial fibrillation: Secondary | ICD-10-CM | POA: Diagnosis not present

## 2016-10-12 DIAGNOSIS — R55 Syncope and collapse: Secondary | ICD-10-CM | POA: Diagnosis present

## 2016-10-12 DIAGNOSIS — S14127A Central cord syndrome at C7 level of cervical spinal cord, initial encounter: Secondary | ICD-10-CM | POA: Diagnosis not present

## 2016-10-12 DIAGNOSIS — R202 Paresthesia of skin: Secondary | ICD-10-CM | POA: Diagnosis not present

## 2016-10-12 DIAGNOSIS — S14125A Central cord syndrome at C5 level of cervical spinal cord, initial encounter: Secondary | ICD-10-CM

## 2016-10-12 DIAGNOSIS — W19XXXD Unspecified fall, subsequent encounter: Secondary | ICD-10-CM

## 2016-10-12 DIAGNOSIS — M79642 Pain in left hand: Secondary | ICD-10-CM | POA: Diagnosis not present

## 2016-10-12 DIAGNOSIS — S14126A Central cord syndrome at C6 level of cervical spinal cord, initial encounter: Secondary | ICD-10-CM | POA: Diagnosis not present

## 2016-10-12 DIAGNOSIS — G4761 Periodic limb movement disorder: Secondary | ICD-10-CM

## 2016-10-12 DIAGNOSIS — M47812 Spondylosis without myelopathy or radiculopathy, cervical region: Secondary | ICD-10-CM | POA: Diagnosis present

## 2016-10-12 DIAGNOSIS — Z9114 Patient's other noncompliance with medication regimen: Secondary | ICD-10-CM

## 2016-10-12 HISTORY — DX: Central cord syndrome at C5 level of cervical spinal cord, initial encounter: S14.125A

## 2016-10-12 HISTORY — DX: Spinal stenosis, cervical region: M48.02

## 2016-10-12 LAB — PHOSPHORUS: Phosphorus: 3.1 mg/dL (ref 2.5–4.6)

## 2016-10-12 LAB — BASIC METABOLIC PANEL
ANION GAP: 8 (ref 5–15)
BUN: 12 mg/dL (ref 6–20)
CHLORIDE: 103 mmol/L (ref 101–111)
CO2: 26 mmol/L (ref 22–32)
Calcium: 8.8 mg/dL — ABNORMAL LOW (ref 8.9–10.3)
Creatinine, Ser: 1.02 mg/dL (ref 0.61–1.24)
GFR calc Af Amer: >60 mL/min (ref >60)
GFR calc non Af Amer: >60 mL/min (ref >60)
GLUCOSE: 94 mg/dL (ref 65–99)
POTASSIUM: 3.1 mmol/L — AB (ref 3.5–5.1)
Sodium: 137 mmol/L (ref 135–145)

## 2016-10-12 LAB — GLUCOSE, CAPILLARY
GLUCOSE-CAPILLARY: 99 mg/dL (ref 65–99)
GLUCOSE-CAPILLARY: 153 mg/dL — AB (ref 65–99)
GLUCOSE-CAPILLARY: 143 mg/dL — AB (ref 65–99)
Glucose-Capillary: 128 mg/dL — ABNORMAL HIGH (ref 65–99)

## 2016-10-12 LAB — CBC
HEMATOCRIT: 36.7 % — AB (ref 39.0–52.0)
HEMOGLOBIN: 12.1 g/dL — AB (ref 13.0–17.0)
MCH: 30.8 pg (ref 26.0–34.0)
MCHC: 33 g/dL (ref 30.0–36.0)
MCV: 93.4 fL (ref 78.0–100.0)
Platelets: 266 10*3/uL (ref 150–400)
RBC: 3.93 MIL/uL — AB (ref 4.22–5.81)
RDW: 13.7 % (ref 11.5–15.5)
WBC: 8.8 10*3/uL (ref 4.0–10.5)

## 2016-10-12 LAB — TROPONIN I
Troponin I: <0.03 ng/mL (ref <0.03)
Troponin I: <0.03 ng/mL (ref <0.03)

## 2016-10-12 LAB — VITAMIN B12: Vitamin B-12: 493 pg/mL (ref 180–914)

## 2016-10-12 LAB — ECHOCARDIOGRAM COMPLETE
HEIGHTINCHES: 69 in
Weight: 3852.8 oz

## 2016-10-12 LAB — MAGNESIUM: Magnesium: 1.9 mg/dL (ref 1.7–2.4)

## 2016-10-12 LAB — HEMOGLOBIN A1C
Hgb A1c MFr Bld: 5.1 % (ref 4.8–5.6)
Mean Plasma Glucose: 100 mg/dL

## 2016-10-12 MED ORDER — ATORVASTATIN CALCIUM 40 MG PO TABS
40.0000 mg | ORAL_TABLET | Freq: Every day | ORAL | Status: DC
  Administered 2016-10-12: 40 mg via ORAL
  Filled 2016-10-12: qty 1

## 2016-10-12 MED ORDER — ATORVASTATIN CALCIUM 20 MG PO TABS: 20.0000 mg | ORAL_TABLET | Freq: Every day | ORAL | Status: DC

## 2016-10-12 MED ORDER — POTASSIUM CHLORIDE CRYS ER 20 MEQ PO TBCR
40.0000 meq | EXTENDED_RELEASE_TABLET | Freq: Once | ORAL | Status: AC
Start: 2016-10-12 — End: 2016-10-12
  Administered 2016-10-12: 40 meq via ORAL
  Filled 2016-10-12: qty 2

## 2016-10-12 NOTE — Progress Notes (Signed)
Attempted to give pt meds x3 doctor in to see pt.  Card currently seeing pt.  Pt in no acute pain as noted .  Will return after.  Karie Kirks, Therapist, sports.

## 2016-10-12 NOTE — Consult Note (Signed)
Requesting provider: Dr. Smitty Cords Primary cardiologist: Dr. Minus Breeding Consulting cardiologist: Dr. Satira Sark  Reason for consultation: Syncope  Clinical Summary [name] [name] is a 71 y.o.male with past medical history outlined below, currently admitted to the hospital after a reported syncopal event. He states that he has a history of recurring dizziness and near-syncope, no obvious arrhythmogenic etiology based on evaluation in the past. He states that sometimes he "blacks out" while eating breakfast in the mornings, not entirely clear that he actually loses consciousness based on his description. He does not report any other obvious triggers. He tells me that the event that brought him to the hospital occurred when he was walking to the kitchen and suddenly passed out on the floor hitting his head. He reports having some tingling in his hands, otherwise does not endorse any chest pain. Sometimes he feels a "fluttering" in his chest, but this does not always precipitate lightheadedness or syncope.  Relates no major health changes recently other than being told that he had a "low potassium level." He has had no major changes in his cardiac regimen.  He last saw [name] [name] in December 2017 and was relatively stable at that time. He does have right cardiac baseline, but not clear that this is symptomatic. He did wear an outpatient cardiac monitor in December 2017 which did not demonstrate any obvious arrhythmias.   Allergies  Allergen Reactions  . Amoxicillin Other (See Comments)    Can't move Has patient had a PCN reaction causing immediate rash, facial/tongue/throat swelling, SOB or lightheadedness with hypotension: No Has patient had a PCN reaction causing severe rash involving mucus membranes or skin necrosis: No Has patient had a PCN reaction that required hospitalization No Has patient had a PCN reaction occurring within the last 10 years: No If all of the above  answers are "NO", then may proceed with Cephalosporin use.   [name] Penicillins Other (See Comments)    Can't move/dizziness Has patient had a PCN reaction causing immediate rash, facial/tongue/throat swelling, SOB or lightheadedness with hypotension: No Has patient had a PCN reaction causing severe rash involving mucus membranes or skin necrosis: No Has patient had a PCN reaction that required hospitalization No Has patient had a PCN reaction occurring within the last 10 years: No If all of the above answers are "NO", then may proceed with Cephalosporin use.     Medications Scheduled Medications: . amLODipine  10 mg Oral BID  . atorvastatin  40 mg Oral QHS  . feeding supplement (ENSURE ENLIVE)  237 mL Oral BID BM  . gabapentin  300 mg Oral TID  . Influenza vac split quadrivalent PF  0.5 mL Intramuscular Tomorrow-1000  . montelukast  10 mg Oral QHS  . potassium chloride SA  20 mEq Oral BID  . potassium chloride  40 mEq Oral Once  . rivaroxaban  20 mg Oral Q supper  . sodium chloride flush  3 mL Intravenous Q12H    PRN Medications: acetaminophen **OR** acetaminophen, albuterol, ondansetron **OR** ondansetron (ZOFRAN) IV, polyethylene glycol   Past Medical History:  Diagnosis Date  . Anxiety   . Aortic root dilatation ([place])    a. 12/2015 Echo: mod dil of sinus of valsalva and Ao root (42mm).  [name] CAD (coronary artery disease)    a. 12/2015 NSTEMI/Cath (in setting of PAF):  LM nl, LAD 30p,  LCX 26m, RCA  ok, AM 100%, RPDA1 40, RPDA 2 60 ->Med Rx.  . Childhood asthma   .  Chronic diastolic CHF (congestive heart failure) ([place])    a. 12/2015 Echo: EF 50-55%, mod AI, mod Ao root dil, mild MR, mod dil LA, mod RA.  [name] COPD (chronic obstructive pulmonary disease) ([place])   . Diabetes mellitus type II   . History of syncope   . Hyperlipidemia   . Hypertensive heart disease   . Moderate aortic insufficiency    a. 12/2015 Echo: Mod AI.  [name] Paroxysmal atrial fibrillation ([place])    a. 12/2015 started  on Xarelto (CHA2DS2VASc = 4-5).  . Pneumonia   . Prostate cancer ([place])   . Renal cell carcinoma ([place])   . Sleep apnea    Does not use CPAP    Past Surgical History:  Procedure Laterality Date  . BACK SURGERY    . CARDIAC CATHETERIZATION N/A 12/18/2015   Procedure: Left Heart Cath and Coronary Angiography;  Surgeon: Leonie Man, MD;  Location: [facility];  Service: Cardiovascular;  Laterality: N/A;  . [place]   "replaced a disc"    Family History  Problem Relation Age of Onset  . Emphysema Father   . Asthma Father   . Heart disease Mother     No details.  Still alive age 47    Social History Mr. [place] reports that he has quit smoking. His smoking use included Cigars. He has never used smokeless tobacco. Mr. Wedge reports that he drinks alcohol.  Review of Systems Complete review of systems negative except as otherwise outlined in the clinical summary and also the following. Hearing loss.  Physical Examination Blood pressure 130/81, pulse (!) 59, temperature 98 F (36.7 C), temperature source Oral, resp. rate 18, height 5\' 9"  (1.753 m), weight 240 lb 12.8 oz (109.2 kg), SpO2 96 %.  Intake/Output Summary (Last 24 hours) at 10/12/16 1124 Last data filed at 10/12/16 0925  Gross per 24 hour  Intake              600 ml  Output              550 ml  Net               50 ml   Telemetry: Sinus rhythm. PACs.  Neural: Obese male in no distress. HEENT: Conjunctiva and lids normal, oropharynx clear. Neck: Supple, no elevated JVP or carotid bruits, no thyromegaly. Lungs: Diminished breath sounds without wheezing, nonlabored breathing at rest. Cardiac: Regular rate and rhythm, no S3, soft diastolic murmur, no pericardial rub. Abdomen: Soft, nontender, bowel sounds present, no guarding or rebound. Extremities: Mild ankle edema, distal pulses 2+. Skin: Warm and dry. Musculoskeletal: No kyphosis. Neuropsychiatric: Alert and oriented x3, affect grossly  appropriate.  Lab Results  Basic Metabolic Panel:  Recent Labs Lab 10/11/16 1354 10/12/16 0800  NA 136 137  K 3.4* 3.1*  CL 104 103  CO2 27 26  GLUCOSE 124* 94  BUN 12 12  CREATININE 0.98 1.02  CALCIUM 8.9 8.8*  MG  --  1.9  PHOS  --  3.1    CBC:  Recent Labs Lab 10/11/16 1354 10/12/16 0800  WBC 6.6 8.8  HGB 12.7* 12.1*  HCT 37.4* 36.7*  MCV 92.6 93.4  PLT 255 266    Cardiac Enzymes:  Recent Labs Lab 10/12/16 0109 10/12/16 0800  TROPONINI <0.03 <0.03    ECG I personally reviewed the tracing from 10/12/2016 shows sinus bradycardia with PACs, left anterior fascicular block, nonspecific ST-T changes.  Imaging  Head and  neck CT 10/11/2016: IMPRESSION: 1. No acute intracranial findings or skull fracture. 2. Stable age related cerebral atrophy, ventriculomegaly and periventricular white matter disease. 3. Left maxillary sinus disease. 4. Advanced degenerative cervical spondylosis with multilevel disc disease and facet disease but no acute cervical spine fracture.  Impression  1. Patient presents after an episode of reported frank syncope and a history of recurring near syncope/syncope. He wore cardiac monitor in December 2017 that did not.demonstrate any specific arrhythmias. He is bradycardic at baseline but not obviously symptomatic with this. Possibility of conduction system disease and pauses to be considered. He also has a history of paroxysmal atrial fibrillation and could be having recurrences with posttermination pauses.  2. Paroxysmal atrial fibrillation, currently in sinus rhythm. He is on Xarelto for stroke prophylaxis. CHADSVASC score 4-5.  3. CAD, overall mild to moderate disease with the exception of an occluded acute marginal. He has been managed medically. No angina symptoms and troponin I negative. LVEF 50-55% by echocardiogram last April.  4. Moderate aortic regurgitation with moderately dilated aortic root.   Recommendations  Continue  with telemetry monitoring to evaluate for arrhythmia/pauses. Follow-up echocardiogram is pending as well. At this point no change to current medical regimen.  Satira Sark, M.D., F.A.C.C.

## 2016-10-12 NOTE — Progress Notes (Signed)
Nutrition Brief Note  Patient identified on the Malnutrition Screening Tool (MST) Report.  Wt Readings from Last 15 Encounters:  10/12/16 240 lb 12.8 oz (109.2 kg)  09/03/16 237 lb (107.5 kg)  08/08/16 244 lb 9.6 oz (110.9 kg)  07/14/16 232 lb (105.2 kg)  04/26/16 246 lb 14.6 oz (112 kg)  03/29/16 247 lb (112 kg)  01/26/16 241 lb (109.3 kg)  01/02/16 245 lb (111.1 kg)  12/26/15 241 lb 3.2 oz (109.4 kg)  12/18/15 239 lb 6.7 oz (108.6 kg)  10/17/15 241 lb (109.3 kg)  06/08/15 251 lb (113.9 kg)  03/09/15 241 lb (109.3 kg)  07/20/14 244 lb 4.8 oz (110.8 kg)  07/11/14 241 lb 8 oz (109.5 kg)    Body mass index is 35.56 kg/m. Patient meets criteria for Obesity Class II based on current BMI.   Current diet order is Regular, patient is consuming approximately 100% of meals at this time. Labs and medications reviewed.   No nutrition interventions warranted at this time. If nutrition issues arise, please consult RD.   Arthur Holms, RD, LDN Pager #: [phone] After-Hours Pager #: [phone]

## 2016-10-12 NOTE — Progress Notes (Signed)
Family Medicine Teaching Service Daily Progress Note Intern Pager: [phone]  Patient name: [name] [name] Medical record number: [id number] Date of birth: 08/20/1946 Age: 71 y.o. Gender: male  Primary Care Provider: Vicenta Aly, [place] Consultants: Cardiology Code Status: Full   Pt Overview and Major Events to Date:  2/9: Admit to FPTS  Assessment and Plan: [name] [name] is a 71 y.o. male presenting with syncope and hand numbness. PMH is significant for paroxysmal atrial fibrillation, palpitations, chronic venous insufficiency, headache, generalized anxiety, hypokalemia, ED, elevated blood glucose, obstructive apnea, HLD, HTN, asthma, CAD, diastolic HF.   Syncope: Vital signs within normal limits besides bradycardia into the 50's still. This is longstanding x 1 year and occurs after meals per patient report. No observed syncopal episodes since admission. EKG on admission showing sinus rhythm but with some premature atrial complexes and some t wave changes, the same is seen today. Mildly prolonged QTC of 453. Troponin negative x 2. Leading differential is postprandial syncope. Will need to rule out cardiogenic vs neurogenic vs psychogenic etiology.  -monitor on telemetry - trend troponins -vitals per floor protocol -orthostatic vital signs -Up with assistance -echocardiogram -PT/OT consult -cardiology consulted, appreciate their assistance  Hand pain and paresthesias: started after syncopal event on admission. CT spine with evidence of DJD and osteophytes. ED provider consulted neurosurgery. Likely due to peripheral neuropathy. 2+ radial pulses, no signs of ischemia. -neurosurgery following, appreciate recommendations. Do not recommend surgical intervention at this time. -gabapentin 300 mg TID -tylenol for pain -Will check b12 and RPR today  PAF- patient on xarelto at home but reports not taking this medication for some time until recently. Not taking any rate controlling  medication. -continue xarelto -monitor on telemetry  Hx of CAD, CHF, unstable angina, and aortic root dilatation and insufficiency: Follows with Dr. Percival Spanish at [facility]. Had a cardiac cath in cardiac cath 4/17 showed OM 100% stenosed, RPDA-2 lesion, 60% stenosedRPDA-1 lesion, 40% stenosed. Medical management was recommended. 12/2015 Echo: EF 50-55%, mod AI, mod Ao root dil, mild MR, mod dil LA, mod RA. - Cardiology consult in the AM  - Continue xarelto and amlodipine  Hypokalemia- takes potassium as outpatient. Potassium 3.4 on admission and now 3.1. Mag and phos normal.  -Continue home KDUR of 20 meq BID -monitor BMP - Will give 40 KDUR this AM -Goal potassium > 4, replete as needed  Diabetes/elevated blood glucose- patient unsure of if he has diabetes but reports having high sugars in past. 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 Could be due to longstanding OSA without CPAP use. TSH within normal limits - Continue to monitor  HTN- takes Norvasc 10 mg at home, BP 126/75 in ED -continue home medications -monitor BP  HLD- on problem list but not currently on statin. Cholesterol 210, LDL 120 -Begin Lipitor 20 mg qhs  Asthma or COPD- patient has albuterol inhaler that he uses "twice a year" and denies having asthma or COPD. Faint wheezes on exam. -albuterol neb -continue singulair  OSA- patient instructed to use CPAP nightly but does not do so due to being claustrophobic  -cpap as needed every night although reportedly patient refuses this   Prolonged QTC: Mildly prolonged QTC of 453 on EKG on February 10 -Continuous cardiac monitoring -Try avoiding QTC prolonging medications  FEN/GI: regular diet Prophylaxis: on xarelto  Disposition: continue to monitor on inpatient   Subjective:  Patient states that he had a  good night despite having some bilateral hand pain still. He notes that the  Tylenol and gabapentin has not helped his hands yet. He feels like the blood circulation is poor and his hands. He denies any syncopal episodes or loss of consciousness. Denies any chest pain, shortness of breath, nausea, vomiting, dizziness, diarrhea, constipation. He is tolerating all of his meals without difficulty.  Objective: Temp:  [97.6 F (36.4 C)-98.5 F (36.9 C)] 98 F (36.7 C) (02/10 0630) Pulse Rate:  [48-58] 51 (02/10 0630) Resp:  [11-19] 18 (02/10 0630) BP: (112-146)/(75-84) 136/77 (02/10 0630) SpO2:  [96 %-99 %] 96 % (02/10 0630) Weight:  [233 lb (105.7 kg)-240 lb 12.8 oz (109.2 kg)] 240 lb 12.8 oz (109.2 kg) (02/10 0630) Physical Exam: General: Elderly gentleman sitting up on the side of the bed, no acute distress Cardiovascular: Regular rate and rhythm, normal S1-S2, no edema, 2+ radial pulses Respiratory: Normal work of breathing, clear to auscultation bilaterally Abdomen: Soft, nondistended, nontender, normal bowel sounds Extremities: No edema  Laboratory:  Recent Labs Lab 10/11/16 1354 10/12/16 0800  WBC 6.6 8.8  HGB 12.7* 12.1*  HCT 37.4* 36.7*  PLT 255 266    Recent Labs Lab 10/11/16 1354  NA 136  K 3.4*  CL 104  CO2 27  BUN 12  CREATININE 0.98  CALCIUM 8.9  GLUCOSE 124*   Lipid Panel     Component Value Date/Time   CHOL 210 (H) 10/11/2016 1900   TRIG 136 10/11/2016 1900   HDL 63 10/11/2016 1900   CHOLHDL 3.3 10/11/2016 1900   VLDL 27 10/11/2016 1900   LDLCALC 120 (H) 10/11/2016 1900    Imaging/Diagnostic Tests: Ct Head Wo Contrast  Result Date: 10/11/2016 CLINICAL DATA:  Syncopal episode.  Fell. EXAM: CT HEAD WITHOUT CONTRAST CT CERVICAL SPINE WITHOUT CONTRAST TECHNIQUE: Multidetector CT imaging of the head and cervical spine was performed following the standard protocol without intravenous contrast. Multiplanar CT image reconstructions of the cervical spine were also generated. COMPARISON:  CT scan 07/03/2014 FINDINGS: CT HEAD FINDINGS  Brain: Stable age related cerebral atrophy, ventriculomegaly and periventricular white matter disease. No extra-axial fluid collections are identified. No CT findings for acute hemispheric infarction or intracranial hemorrhage. No mass lesions. The brainstem and cerebellum are normal. Vascular: Stable scattered vascular calcifications. No aneurysm or hyperdense vessels. Skull: No skull fracture or bone lesion. Sinuses/Orbits: Moderate fluid noted in the left maxillary sinus which may suggest acute sinusitis. No sinus wall fractures. The paranasal sinuses and mastoid air cells are otherwise clear. Other: No scalp lesion or hematoma.  The globes are intact. CT CERVICAL SPINE FINDINGS Alignment: Grossly normal. Straightening of the normal cervical lordosis. Advanced degenerative cervical spondylosis with multilevel disc disease and facet disease. Skull base and vertebrae: No acute fracture. Soft tissues and spinal canal: No abnormal prevertebral soft tissue swelling. Disc levels: Multilevel disc disease and facet disease but no significant spinal stenosis. Mild to moderate multilevel foraminal stenosis. Upper chest: The visualized lung apices are grossly clear. Other: None IMPRESSION: 1. No acute intracranial findings or skull fracture. 2. Stable age related cerebral atrophy, ventriculomegaly and periventricular white matter disease. 3. Left maxillary sinus disease. 4. Advanced degenerative cervical spondylosis with multilevel disc disease and facet disease but no acute cervical spine fracture. Electronically Signed   By: Marijo Sanes M.D.   On: 10/11/2016 14:38   Ct Cervical Spine Wo Contrast  Result Date: 10/11/2016 CLINICAL DATA:  Syncopal episode.  Fell. EXAM: CT HEAD WITHOUT CONTRAST CT CERVICAL SPINE  WITHOUT CONTRAST TECHNIQUE: Multidetector CT imaging of the head and cervical spine was performed following the standard protocol without intravenous contrast. Multiplanar CT image reconstructions of the cervical  spine were also generated. COMPARISON:  CT scan 07/03/2014 FINDINGS: CT HEAD FINDINGS Brain: Stable age related cerebral atrophy, ventriculomegaly and periventricular white matter disease. No extra-axial fluid collections are identified. No CT findings for acute hemispheric infarction or intracranial hemorrhage. No mass lesions. The brainstem and cerebellum are normal. Vascular: Stable scattered vascular calcifications. No aneurysm or hyperdense vessels. Skull: No skull fracture or bone lesion. Sinuses/Orbits: Moderate fluid noted in the left maxillary sinus which may suggest acute sinusitis. No sinus wall fractures. The paranasal sinuses and mastoid air cells are otherwise clear. Other: No scalp lesion or hematoma.  The globes are intact. CT CERVICAL SPINE FINDINGS Alignment: Grossly normal. Straightening of the normal cervical lordosis. Advanced degenerative cervical spondylosis with multilevel disc disease and facet disease. Skull base and vertebrae: No acute fracture. Soft tissues and spinal canal: No abnormal prevertebral soft tissue swelling. Disc levels: Multilevel disc disease and facet disease but no significant spinal stenosis. Mild to moderate multilevel foraminal stenosis. Upper chest: The visualized lung apices are grossly clear. Other: None IMPRESSION: 1. No acute intracranial findings or skull fracture. 2. Stable age related cerebral atrophy, ventriculomegaly and periventricular white matter disease. 3. Left maxillary sinus disease. 4. Advanced degenerative cervical spondylosis with multilevel disc disease and facet disease but no acute cervical spine fracture. Electronically Signed   By: Marijo Sanes M.D.   On: 10/11/2016 14:38   Mr Cervical Spine W Wo Contrast  Result Date: 10/11/2016 CLINICAL DATA:  Syncopal left-sided.  Neck pain. EXAM: MRI CERVICAL SPINE WITHOUT AND WITH CONTRAST TECHNIQUE: Multiplanar and multiecho pulse sequences of the cervical spine, to include the craniocervical junction  and cervicothoracic junction, were obtained without and with intravenous contrast. CONTRAST:  64mL MULTIHANCE GADOBENATE DIMEGLUMINE 529 MG/ML IV SOLN COMPARISON:  None. FINDINGS: Patient motion degrades image quality limiting evaluation. Alignment: Physiologic. Vertebrae: No fracture, evidence of discitis, or bone lesion. Cord: Normal signal and morphology. Posterior Fossa, vertebral arteries, paraspinal tissues: Negative. Disc levels: Discs: Degenerative disc disease with disc height loss at C3-4, C4-5, C5-6 and C6-7. C2-3: No significant disc bulge. Moderate bilateral foraminal stenosis. No central canal stenosis. C3-4: Mild broad-based disc bulge. Bilateral uncovertebral degenerative changes. Severe left and moderate right foraminal stenosis. Mild spinal stenosis. C4-5: Broad-based disc osteophyte complex with bilateral uncovertebral degenerative changes and severe bilateral foraminal stenosis. Moderate spinal stenosis. C5-6: Broad-based disc osteophyte complex effacing the ventral CSF space and contacting the ventral spinal cord. Bilateral uncovertebral degenerative changes. Moderate spinal stenosis. C6-7: Broad-based disc bulge. Bilateral uncovertebral degenerative changes. Moderate bilateral foraminal stenosis. Mild spinal stenosis. C7-T1: No significant disc bulge. No neural foraminal stenosis. No central canal stenosis. T1-2:  Mild bilateral facet arthropathy. IMPRESSION: 1. Diffuse cervical spine spondylosis as described above. 2. At C3-4 there is a mild broad-based disc bulge. Bilateral uncovertebral degenerative changes. Severe left and moderate right foraminal stenosis. Mild spinal stenosis. 3. At C4-5 there is a broad-based disc osteophyte complex with bilateral uncovertebral degenerative changes and severe bilateral foraminal stenosis. Moderate spinal stenosis. 4. At C5-6 there is a broad-based disc osteophyte complex effacing the ventral CSF space and contacting the ventral spinal cord. Bilateral  uncovertebral degenerative changes. Moderate spinal stenosis. 5. At C6-7 there is a broad-based disc bulge. Bilateral uncovertebral degenerative changes. Moderate bilateral foraminal stenosis. Mild spinal stenosis. Electronically Signed   By: Kathreen Devoid  On: 10/11/2016 17:16   Dg Hand Complete Left  Result Date: 10/11/2016 CLINICAL DATA:  Fall.  Anticoagulated. EXAM: LEFT HAND - COMPLETE 3+ VIEW COMPARISON:  None. FINDINGS: Peripheral intravenous catheter overlies the left wrist and metacarpals. No fracture or dislocation. No suspicious focal osseous lesion. 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 No appreciable erosive arthropathy. Enthesophyte at the distal tuft of the distal phalanx in the left fourth finger. IMPRESSION: No fracture or dislocation. Electronically Signed   By: Ilona Sorrel M.D.   On: 10/11/2016 14:41     Carlyle Dolly, MD 10/12/2016, 10:07 AM PGY-2, [place] Intern pager: [phone], text pages welcome

## 2016-10-12 NOTE — Progress Notes (Signed)
Pt refusing CPAP at this time.

## 2016-10-12 NOTE — Progress Notes (Signed)
Pt c/o poor circulation on hands.  Good capillary refill on all fingers.  Notify Dr.  Varney Daily and her pt has underline hand tingling.   Pt made aware.   Will continue to monitor,  Karenann Cai, RN.

## 2016-10-12 NOTE — Evaluation (Signed)
Physical Therapy Evaluation Patient Details Name: [name] [name] MRN: [id number] DOB: 11-14-1945 Today's Date: 10/12/2016   History of Present Illness  Pt is a 71 y/o male admitted secondary to a syncopal episode and fall at home. PMH including but not limited to CAD, CHF, DM, HTN and COPD.   Clinical Impression  Pt presented supine in bed with HOB elevated, awake and willing to participate in therapy session. Prior to admission, pt reported that he was independent with all functional mobility and ADLs. Pt currently requires min guard for transfers and ambulation without an AD. Pt's main concern is regarding the tingling in his hands; RN notified. Pt would continue to benefit from skilled physical therapy services at this time while admitted to address his below listed limitations in order to improve his overall safety and independence with functional mobility.       Follow Up Recommendations No PT follow up    Equipment Recommendations  None recommended by PT    Recommendations for Other Services       Precautions / Restrictions Precautions Precautions: Fall Restrictions Weight Bearing Restrictions: No      Mobility  Bed Mobility Overal bed mobility: Modified Independent             General bed mobility comments: increased time and effort  Transfers Overall transfer level: Needs assistance Equipment used: None Transfers: Sit to/from Stand Sit to Stand: Min guard         General transfer comment: increased time, min guard for safety  Ambulation/Gait Ambulation/Gait assistance: Min guard Ambulation Distance (Feet): 40 Feet Assistive device: None Gait Pattern/deviations: Step-through pattern;Decreased step length - right;Decreased step length - left;Decreased stride length;Shuffle;Trunk flexed Gait velocity: decreased Gait velocity interpretation: Below normal speed for age/gender General Gait Details: pt very cautious and steady, mild instability but no LOB, min  guard for safety  Stairs            Wheelchair Mobility    Modified Rankin (Stroke Patients Only)       Balance Overall balance assessment: Needs assistance;History of Falls Sitting-balance support: Feet supported Sitting balance-Leahy Scale: Fair     Standing balance support: During functional activity;No upper extremity supported Standing balance-Leahy Scale: Fair                               Pertinent Vitals/Pain Pain Assessment: No/denies pain    Home Living Family/patient expects to be discharged to:: Private residence Living Arrangements: Alone Available Help at Discharge: Friend(s);Available PRN/intermittently Type of Home: House Home Access: Level entry     Home Layout: One level Home Equipment: None      Prior Function Level of Independence: Independent               Hand Dominance        Extremity/Trunk Assessment   Upper Extremity Assessment Upper Extremity Assessment: Generalized weakness;RUE deficits/detail;LUE deficits/detail RUE Deficits / Details: pt reported weakness and tingling in bilateral hands LUE Deficits / Details: pt reported weakness and tingling in bilateral hands    Lower Extremity Assessment Lower Extremity Assessment: Overall WFL for tasks assessed    Cervical / Trunk Assessment Cervical / Trunk Assessment: Normal  Communication   Communication: No difficulties  Cognition Arousal/Alertness: Awake/alert Behavior During Therapy: WFL for tasks assessed/performed Overall Cognitive Status: Within Functional Limits for tasks assessed  General Comments      Exercises     Assessment/Plan    PT Assessment Patient needs continued PT services  PT Problem List Decreased strength;Decreased balance;Decreased mobility;Decreased coordination;Decreased safety awareness;Decreased knowledge of precautions;Impaired sensation          PT Treatment Interventions DME  instruction;Gait training;Stair training;Functional mobility training;Therapeutic activities;Therapeutic exercise;Balance training;Neuromuscular re-education;Patient/family education    PT Goals (Current goals can be found in the Care Plan section)  Acute Rehab PT Goals Patient Stated Goal: regain sensation in bilateral hands PT Goal Formulation: With patient Time For Goal Achievement: 10/26/16 Potential to Achieve Goals: Good    Frequency Min 3X/week   Barriers to discharge        Co-evaluation               End of Session Equipment Utilized During Treatment: Gait belt Activity Tolerance: Patient tolerated treatment well Patient left: in bed;with call bell/phone within reach;with bed alarm set (sitting EOB) Nurse Communication: Mobility status    Functional Assessment Tool Used: clinical judgement Functional Limitation: Mobility: Walking and moving around Mobility: Walking and Moving Around Current Status (Y3888): At least 1 percent but less than 20 percent impaired, limited or restricted Mobility: Walking and Moving Around Goal Status [phone]): 0 percent impaired, limited or restricted    Time: 0851-0905 PT Time Calculation (min) (ACUTE ONLY): 14 min   Charges:   PT Evaluation $PT Eval Moderate Complexity: 1 Procedure     PT G Codes:   PT G-Codes **NOT FOR INPATIENT CLASS** Functional Assessment Tool Used: clinical judgement Functional Limitation: Mobility: Walking and moving around Mobility: Walking and Moving Around Current Status (K8206): At least 1 percent but less than 20 percent impaired, limited or restricted Mobility: Walking and Moving Around Goal Status [phone]): 0 percent impaired, limited or restricted    [facility] 10/12/2016, 9:50 AM Sherie Don, PT, DPT [phone]

## 2016-10-13 DIAGNOSIS — R55 Syncope and collapse: Secondary | ICD-10-CM | POA: Diagnosis not present

## 2016-10-13 DIAGNOSIS — M47892 Other spondylosis, cervical region: Secondary | ICD-10-CM | POA: Diagnosis not present

## 2016-10-13 DIAGNOSIS — I480 Paroxysmal atrial fibrillation: Secondary | ICD-10-CM | POA: Diagnosis not present

## 2016-10-13 DIAGNOSIS — S0990XD Unspecified injury of head, subsequent encounter: Secondary | ICD-10-CM | POA: Diagnosis not present

## 2016-10-13 DIAGNOSIS — R202 Paresthesia of skin: Secondary | ICD-10-CM | POA: Diagnosis not present

## 2016-10-13 DIAGNOSIS — S14125A Central cord syndrome at C5 level of cervical spinal cord, initial encounter: Secondary | ICD-10-CM | POA: Diagnosis not present

## 2016-10-13 LAB — CBC
HCT: 37.7 % — ABNORMAL LOW (ref 39.0–52.0)
Hemoglobin: 12.5 g/dL — ABNORMAL LOW (ref 13.0–17.0)
MCH: 30.9 pg (ref 26.0–34.0)
MCHC: 33.2 g/dL (ref 30.0–36.0)
MCV: 93.3 fL (ref 78.0–100.0)
PLATELETS: 260 10*3/uL (ref 150–400)
RBC: 4.04 MIL/uL — ABNORMAL LOW (ref 4.22–5.81)
RDW: 13.7 % (ref 11.5–15.5)
WBC: 9.2 10*3/uL (ref 4.0–10.5)

## 2016-10-13 LAB — BASIC METABOLIC PANEL
Anion gap: 7 (ref 5–15)
BUN: 13 mg/dL (ref 6–20)
CALCIUM: 8.8 mg/dL — AB (ref 8.9–10.3)
CHLORIDE: 105 mmol/L (ref 101–111)
CO2: 24 mmol/L (ref 22–32)
CREATININE: 0.98 mg/dL (ref 0.61–1.24)
GFR calc Af Amer: >60 mL/min (ref >60)
Glucose, Bld: 154 mg/dL — ABNORMAL HIGH (ref 65–99)
Potassium: 3.6 mmol/L (ref 3.5–5.1)
SODIUM: 136 mmol/L (ref 135–145)

## 2016-10-13 LAB — GLUCOSE, CAPILLARY
GLUCOSE-CAPILLARY: 124 mg/dL — AB (ref 65–99)
GLUCOSE-CAPILLARY: 106 mg/dL — AB (ref 65–99)
Glucose-Capillary: 111 mg/dL — ABNORMAL HIGH (ref 65–99)

## 2016-10-13 LAB — RPR: RPR: NONREACTIVE

## 2016-10-13 MED ORDER — GABAPENTIN 600 MG PO TABS: 600.0000 mg | ORAL_TABLET | Freq: Three times a day (TID) | ORAL | 0 refills | Status: DC

## 2016-10-13 MED ORDER — GABAPENTIN 600 MG PO TABS
600.0000 mg | ORAL_TABLET | Freq: Three times a day (TID) | ORAL | Status: DC
  Administered 2016-10-13 (×2): 600 mg via ORAL
  Filled 2016-10-13 (×2): qty 1

## 2016-10-13 MED ORDER — ATORVASTATIN CALCIUM 40 MG PO TABS: 40.0000 mg | ORAL_TABLET | Freq: Every day | ORAL | 0 refills | Status: DC

## 2016-10-13 NOTE — Progress Notes (Signed)
At 1908 pt received all d/c instructions after explaining.  Son at bedside.   D/c off floor to awaiting transport.  Karie Kirks, Therapist, sports.

## 2016-10-13 NOTE — Progress Notes (Signed)
Family Medicine Teaching Service Daily Progress Note Intern Pager: [phone]  Patient name: [name] [name] Medical record number: [id number] Date of birth: [birth year] Age: 71 y.o. Gender: male  Primary Care Provider: Vicenta Aly, [place] Consultants: Cardiology Code Status: Full   Pt Overview and Major Events to Date:  2/9: Admit to FPTS  Assessment and Plan: [name] [name] is a 71 y.o. male presenting with syncope and hand numbness. PMH is significant for paroxysmal atrial fibrillation, palpitations, chronic venous insufficiency, headache, generalized anxiety, hypokalemia, ED, elevated blood glucose, obstructive apnea, HLD, HTN, asthma, CAD, diastolic HF.   Syncope: Vital signs within normal limits besides bradycardia into the 50's still. Episodes longstanding x 1 year and occurs after meals per patient report. No observed syncopal episodes since admission. EKG on admission showing sinus rhythm but with some premature atrial complexes and some t wave changes. Mildly prolonged QTC of 453. Troponin negative x 2. Leading differential is postprandial syncope. Will need to rule out cardiogenic vs neurogenic vs psychogenic etiology.  - monitor on telemetry - cardiac echo 60-65% EF, G2DD, severe concentric hypertrophy - troponin negative x3 - postive orthostatics - Up with assistance - PT/OT consult - no PT follow up - cardiology consulted, appreciate their assistance  Hand pain and paresthesias: started after syncopal event on admission. CT spine with evidence of DJD and osteophytes. After hitting head, may be a central cord syndrome given osteophytes.  2+ radial pulses, no signs of ischemia. Hands are warm and well-perfused. B12 WNL at 493.   - neurosurgery following, appreciate recommendations. Do not recommend surgical intervention at this time. - gabapentin 300 mg TID >>> will increase to 600 mg TID to help with pain because he is tolerating this dose - tylenol for pain - RPR pending.  PAF-  patient on xarelto at home but reports not taking this medication for some time until recently. Not taking any rate controlling medication. -continue xarelto -monitor on telemetry  Hx of CAD, CHF, unstable angina, and aortic root dilatation and insufficiency: Follows with Dr. Percival Spanish at [facility]. Had a cardiac cath in cardiac cath 4/17 showed OM 100% stenosed, RPDA-2 lesion, 60% stenosedRPDA-1 lesion, 40% stenosed. Medical management was recommended. 12/2015 Echo: EF 50-55%, mod AI, mod Ao root dil, mild MR, mod dil LA, mod RA. - Continue xarelto and amlodipine  Hypokalemia- takes potassium as outpatient. Potassium 3.4>3.1>repleted 40 KDUR. Mag and phos normal.  - K 3.6 - Goal potassium > 4, will continue 20 mg KDUR x2 today  - recheck AM BMP  Diabetes/elevated blood glucose- patient unsure of if he has diabetes but reports having high sugars in past. 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 Could be due to longstanding OSA without CPAP use. TSH within normal limits - Continue to monitor  HTN- takes Norvasc 10 mg at home, BP at goal. -continue home medications -monitor BP  HLD- on problem list but not currently on statin. Cholesterol 210, LDL 120 -Begin Lipitor 20 mg qhs  Asthma or COPD- patient has albuterol inhaler that he uses "twice a year" and denies having asthma or COPD. Faint wheezes on exam. -albuterol neb -continue singulair  OSA- patient instructed to use CPAP nightly but does not do so due to being claustrophobic  -cpap as needed every night although reportedly patient refuses this   Prolonged QTC: Mildly prolonged QTC of 453 on EKG on February 10 -Continuous cardiac monitoring -Try avoiding QTC prolonging  medications  FEN/GI: regular diet Prophylaxis: on xarelto  Disposition: continue to monitor on inpatient   Subjective:  Patient's biggest concern this morning is the pain in  his hands bilaterally. Pain is in dorsal hands from metatarsals to wrists, not dermatomal. Acutely tender to initial touch but then tolerates me examining him.  His pulses are strong and his hands are warm to the touch. He believes he "lost so much blood" when he fell that he is not getting good blood flow to his hands.  Objective: Temp:  [98 F (36.7 C)-98.9 F (37.2 C)] 98.6 F (37 C) (02/11 0158) Pulse Rate:  [51-61] 56 (02/11 0158) Resp:  [18-20] 18 (02/11 0158) BP: (130-137)/(75-84) 137/75 (02/11 0158) SpO2:  [94 %-96 %] 96 % (02/11 0158) Weight:  [240 lb 12.8 oz (109.2 kg)] 240 lb 12.8 oz (109.2 kg) (02/10 0630) Physical Exam: General: NAD, rests comfortably in bed Cardiovascular: Regular rate and rhythm, normal S1-S2, no edema, 2+radial and ulnar pulses bilaterally, hands warm to the touch and well-perfused Respiratory: Normal work of breathing, clear to auscultation bilaterally Abdomen: Soft, nondistended, nontender, normal bowel sounds Extremities: No edema, moves 4 extremities equally  Laboratory:  Recent Labs Lab 10/11/16 1354 10/12/16 0800  WBC 6.6 8.8  HGB 12.7* 12.1*  HCT 37.4* 36.7*  PLT 255 266    Recent Labs Lab 10/11/16 1354 10/12/16 0800  NA 136 137  K 3.4* 3.1*  CL 104 103  CO2 27 26  BUN 12 12  CREATININE 0.98 1.02  CALCIUM 8.9 8.8*  GLUCOSE 124* 94   Lipid Panel     Component Value Date/Time   CHOL 210 (H) 10/11/2016 1900   TRIG 136 10/11/2016 1900   HDL 63 10/11/2016 1900   CHOLHDL 3.3 10/11/2016 1900   VLDL 27 10/11/2016 1900   LDLCALC 120 (H) 10/11/2016 1900    Imaging/Diagnostic Tests: No results found.   Everrett Coombe, MD 10/13/2016, 5:33 AM PGY-2, [place] Intern pager: [phone], text pages welcome

## 2016-10-13 NOTE — Progress Notes (Signed)
Progress Note  Patient Name: [name] [name] Date of Encounter: 10/13/2016  Primary Cardiologist: Dr. Minus Breeding  Subjective   No chest pain or shortness of breath. No palpitations. No dizziness or syncope.  Inpatient Medications    Scheduled Meds: . amLODipine  10 mg Oral BID  . atorvastatin  40 mg Oral QHS  . feeding supplement (ENSURE ENLIVE)  237 mL Oral BID BM  . gabapentin  600 mg Oral TID  . Influenza vac split quadrivalent PF  0.5 mL Intramuscular Tomorrow-1000  . montelukast  10 mg Oral QHS  . potassium chloride SA  20 mEq Oral BID  . rivaroxaban  20 mg Oral Q supper  . sodium chloride flush  3 mL Intravenous Q12H    PRN Meds: acetaminophen **OR** acetaminophen, albuterol, ondansetron **OR** ondansetron (ZOFRAN) IV, polyethylene glycol   Vital Signs    Vitals:   10/13/16 0934 10/13/16 0935 10/13/16 0937 10/13/16 0940  BP:      Pulse: 63 63 67 68  Resp:      Temp:      TempSrc:      SpO2:      Weight:      Height:        Intake/Output Summary (Last 24 hours) at 10/13/16 1202 Last data filed at 10/13/16 0917  Gross per 24 hour  Intake             1127 ml  Output             1400 ml  Net             -273 ml   Filed Weights   10/11/16 1800 10/12/16 0630 10/13/16 0546  Weight: 239 lb 11.2 oz (108.7 kg) 240 lb 12.8 oz (109.2 kg) 236 lb 14.4 oz (107.5 kg)    Telemetry    I personally reviewed telemetry which shows sinus rhythm and sinus bradycardia with occasional PACs. No pauses.   ECG    I personally reviewed the tracing from 10/12/2016 which shows sinus bradycardia with PACs, LVH, and repolarization abnormalities.  Physical Exam   GEN: Obese male, no acute distress.   Neck: No JVD Cardiac: RRR, soft diastolic murmur.  Respiratory:  Clear with decreased breath sounds. GI: Soft, nontender, non-distended  MS: Mild ankle edema; No deformity.  Labs    Chemistry  Recent Labs Lab 10/11/16 1354 10/12/16 0800 10/13/16 0630  NA 136 137 136   K 3.4* 3.1* 3.6  CL 104 103 105  CO2 27 26 24   GLUCOSE 124* 94 154*  BUN 12 12 13   CREATININE 0.98 1.02 0.98  CALCIUM 8.9 8.8* 8.8*  GFRNONAA >60 >60 >60  GFRAA >60 >60 >60  ANIONGAP 5 8 7      Hematology  Recent Labs Lab 10/11/16 1354 10/12/16 0800 10/13/16 0630  WBC 6.6 8.8 9.2  RBC 4.04* 3.93* 4.04*  HGB 12.7* 12.1* 12.5*  HCT 37.4* 36.7* 37.7*  MCV 92.6 93.4 93.3  MCH 31.4 30.8 30.9  MCHC 34.0 33.0 33.2  RDW 13.5 13.7 13.7  PLT 255 266 260    Cardiac Enzymes  Recent Labs Lab 10/12/16 0109 10/12/16 0800 10/12/16 1557  TROPONINI <0.03 <0.03 <0.03   No results for input(s): TROPIPOC in the last 168 hours.   Radiology    Ct Head Wo Contrast  Result Date: 10/11/2016 CLINICAL DATA:  Syncopal episode.  Fell. EXAM: CT HEAD WITHOUT CONTRAST CT CERVICAL SPINE WITHOUT CONTRAST TECHNIQUE: Multidetector CT imaging of the head  and cervical spine was performed following the standard protocol without intravenous contrast. Multiplanar CT image reconstructions of the cervical spine were also generated. COMPARISON:  CT scan 07/03/2014 FINDINGS: CT HEAD FINDINGS Brain: Stable age related cerebral atrophy, ventriculomegaly and periventricular white matter disease. No extra-axial fluid collections are identified. No CT findings for acute hemispheric infarction or intracranial hemorrhage. No mass lesions. The brainstem and cerebellum are normal. Vascular: Stable scattered vascular calcifications. No aneurysm or hyperdense vessels. Skull: No skull fracture or bone lesion. Sinuses/Orbits: Moderate fluid noted in the left maxillary sinus which may suggest acute sinusitis. No sinus wall fractures. The paranasal sinuses and mastoid air cells are otherwise clear. Other: No scalp lesion or hematoma.  The globes are intact. CT CERVICAL SPINE FINDINGS Alignment: Grossly normal. Straightening of the normal cervical lordosis. Advanced degenerative cervical spondylosis with multilevel disc disease and  facet disease. Skull base and vertebrae: No acute fracture. Soft tissues and spinal canal: No abnormal prevertebral soft tissue swelling. Disc levels: Multilevel disc disease and facet disease but no significant spinal stenosis. Mild to moderate multilevel foraminal stenosis. Upper chest: The visualized lung apices are grossly clear. Other: None IMPRESSION: 1. No acute intracranial findings or skull fracture. 2. Stable age related cerebral atrophy, ventriculomegaly and periventricular white matter disease. 3. Left maxillary sinus disease. 4. Advanced degenerative cervical spondylosis with multilevel disc disease and facet disease but no acute cervical spine fracture. Electronically Signed   By: Marijo Sanes M.D.   On: 10/11/2016 14:38   Ct Cervical Spine Wo Contrast  Result Date: 10/11/2016 CLINICAL DATA:  Syncopal episode.  Fell. EXAM: CT HEAD WITHOUT CONTRAST CT CERVICAL SPINE WITHOUT CONTRAST TECHNIQUE: Multidetector CT imaging of the head and cervical spine was performed following the standard protocol without intravenous contrast. Multiplanar CT image reconstructions of the cervical spine were also generated. COMPARISON:  CT scan 07/03/2014 FINDINGS: CT HEAD FINDINGS Brain: Stable age related cerebral atrophy, ventriculomegaly and periventricular white matter disease. No extra-axial fluid collections are identified. No CT findings for acute hemispheric infarction or intracranial hemorrhage. No mass lesions. The brainstem and cerebellum are normal. Vascular: Stable scattered vascular calcifications. No aneurysm or hyperdense vessels. Skull: No skull fracture or bone lesion. Sinuses/Orbits: Moderate fluid noted in the left maxillary sinus which may suggest acute sinusitis. No sinus wall fractures. The paranasal sinuses and mastoid air cells are otherwise clear. Other: No scalp lesion or hematoma.  The globes are intact. CT CERVICAL SPINE FINDINGS Alignment: Grossly normal. Straightening of the normal cervical  lordosis. Advanced degenerative cervical spondylosis with multilevel disc disease and facet disease. Skull base and vertebrae: No acute fracture. Soft tissues and spinal canal: No abnormal prevertebral soft tissue swelling. Disc levels: Multilevel disc disease and facet disease but no significant spinal stenosis. Mild to moderate multilevel foraminal stenosis. Upper chest: The visualized lung apices are grossly clear. Other: None IMPRESSION: 1. No acute intracranial findings or skull fracture. 2. Stable age related cerebral atrophy, ventriculomegaly and periventricular white matter disease. 3. Left maxillary sinus disease. 4. Advanced degenerative cervical spondylosis with multilevel disc disease and facet disease but no acute cervical spine fracture. Electronically Signed   By: Marijo Sanes M.D.   On: 10/11/2016 14:38   Mr Cervical Spine W Wo Contrast  Result Date: 10/11/2016 CLINICAL DATA:  Syncopal left-sided.  Neck pain. EXAM: MRI CERVICAL SPINE WITHOUT AND WITH CONTRAST TECHNIQUE: Multiplanar and multiecho pulse sequences of the cervical spine, to include the craniocervical junction and cervicothoracic junction, were obtained without and with intravenous contrast.  CONTRAST:  52mL MULTIHANCE GADOBENATE DIMEGLUMINE 529 MG/ML IV SOLN COMPARISON:  None. FINDINGS: Patient motion degrades image quality limiting evaluation. Alignment: Physiologic. Vertebrae: No fracture, evidence of discitis, or bone lesion. Cord: Normal signal and morphology. Posterior Fossa, vertebral arteries, paraspinal tissues: Negative. Disc levels: Discs: Degenerative disc disease with disc height loss at C3-4, C4-5, C5-6 and C6-7. C2-3: No significant disc bulge. Moderate bilateral foraminal stenosis. No central canal stenosis. C3-4: Mild broad-based disc bulge. Bilateral uncovertebral degenerative changes. Severe left and moderate right foraminal stenosis. Mild spinal stenosis. C4-5: Broad-based disc osteophyte complex with bilateral  uncovertebral degenerative changes and severe bilateral foraminal stenosis. Moderate spinal stenosis. C5-6: Broad-based disc osteophyte complex effacing the ventral CSF space and contacting the ventral spinal cord. Bilateral uncovertebral degenerative changes. Moderate spinal stenosis. C6-7: Broad-based disc bulge. Bilateral uncovertebral degenerative changes. Moderate bilateral foraminal stenosis. Mild spinal stenosis. C7-T1: No significant disc bulge. No neural foraminal stenosis. No central canal stenosis. T1-2:  Mild bilateral facet arthropathy. IMPRESSION: 1. Diffuse cervical spine spondylosis as described above. 2. At C3-4 there is a mild broad-based disc bulge. Bilateral uncovertebral degenerative changes. Severe left and moderate right foraminal stenosis. Mild spinal stenosis. 3. At C4-5 there is a broad-based disc osteophyte complex with bilateral uncovertebral degenerative changes and severe bilateral foraminal stenosis. Moderate spinal stenosis. 4. At C5-6 there is a broad-based disc osteophyte complex effacing the ventral CSF space and contacting the ventral spinal cord. Bilateral uncovertebral degenerative changes. Moderate spinal stenosis. 5. At C6-7 there is a broad-based disc bulge. Bilateral uncovertebral degenerative changes. Moderate bilateral foraminal stenosis. Mild spinal stenosis. Electronically Signed   By: Kathreen Devoid   On: 10/11/2016 17:16   Dg Hand Complete Left  Result Date: 10/11/2016 CLINICAL DATA:  Fall.  Anticoagulated. EXAM: LEFT HAND - COMPLETE 3+ VIEW COMPARISON:  None. FINDINGS: Peripheral intravenous catheter overlies the left wrist and metacarpals. No fracture or dislocation. No suspicious focal osseous lesion. 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 No appreciable erosive arthropathy. Enthesophyte at the distal tuft of the distal phalanx in the left fourth finger. IMPRESSION: No fracture or dislocation. Electronically Signed   By: Ilona Sorrel M.D.   On: 10/11/2016 14:41    Cardiac Studies   Echocardiogram 10/12/2016: Study Conclusions  - Left ventricle: The cavity size was normal. There was severe   concentric hypertrophy. Systolic function was normal. The   estimated ejection fraction was in the range of 60% to 65%. Wall   motion was normal; there were no regional wall motion   abnormalities. Doppler parameters are consistent with abnormal   left ventricular relaxation (grade 1 diastolic dysfunction). - Aortic valve: There was moderate regurgitation. - Aorta: The aorta was moderately dilated. Aortic root dimension:   52 mm (ED). - Left atrium: The atrium was moderately dilated. - Right atrium: The atrium was mildly dilated.  Impressions:  - Dilated aortic root. Consider CT scan of ascending aorta to   further quantify aorta.   Patient Profile     71 y.o. male with a history of aortic regurgitation and aortic root dilatation, CAD, chronic diastolic heart failure, and PAF, now admitted after episodes of reported syncope and near syncope. Noted to be bradycardic, but no obvious pauses or symptomatic arrhythmias by monitoring. LVEF remains normal by follow-up echocardiography. No evidence of ACS.  Assessment & Plan    1. History of syncope and recurring near syncope/syncope by report. No obvious arrhythmogenic cause identified so far including by telemetry during hospital  observation. He does have bradycardia but it is not clear that this is symptomatic. No evidence of ACS by enzymes. Echocardiogram shows normal LVEF.  2. Dilated aortic root, measures 52 mm by current echocardiogram, had been 46 mm. Doubt that this is a symptomatic issue now, but would further investigate with CTA ultimately.  3. Paroxysmal atrial fibrillation, currently in sinus rhythm. He is on Xarelto for stroke prophylaxis. CHADSVASC score 4-5.  4. CAD, overall mild to moderate disease with the exception of an occluded acute marginal. He has  been managed medically. No angina symptoms and troponin I negative.  5. Moderate aortic regurgitation.  Continue current cardiac regimen. Eventually would further evaluate aortic root dilatation with CTA. Further outpatient cardiac monitoring could be considered to investigate his recurring reported syncope, although monitoring from December 2017 was unrevealing. Although conduction system disease or posttermination pauses from AF could be considered, nothing has been specifically proven so far.  Signed, Rozann Lesches, MD  10/13/2016, 12:02 PM

## 2016-10-13 NOTE — Discharge Instructions (Signed)
[name] [name],  Cardiology recommended continuing your blood thinner (xarelto) and blood pressure medication (norvasc) as before. Please follow-up with Cardiology and your primary care doctor. You may need increased potassium supplementation depending on repeat labs. Please ask your doctor about protein supplementation options.  Near-Syncope Introduction Near-syncope is when you suddenly get weak or dizzy, or you feel like you might pass out (faint). During an episode of near-syncope, you may:  Feel dizzy or light-headed.  Feel sick to your stomach (nauseous).  See all white or all black.  Have cold, clammy skin. If you passed out, get help right away.Call your local emergency services (911 in the [place] Do not drive yourself to the hospital. Follow these instructions at home: Pay attention to any changes in your symptoms. Take these actions to help with your condition:  Have someone stay with you until you feel stable.  Do not drive, use machinery, or play sports until your doctor says it is okay.  Keep all follow-up visits as told by your doctor. This is important.  If you start to feel like you might pass out, lie down right away and raise (elevate) your feet above the level of your heart. Breathe deeply and steadily. Wait until all of the symptoms are gone.  Drink enough fluid to keep your pee (urine) clear or pale yellow.  If you are taking blood pressure or heart medicine, get up slowly and spend many minutes getting ready to sit and then stand. This can help with dizziness.  Take over-the-counter and prescription medicines only as told by your doctor. Get help right away if:  You have a very bad headache.  You have unusual pain in your chest, tummy, or back.  You are bleeding from your mouth or rectum.  You have black or tarry poop (stool).  You have a very fast or uneven heartbeat (palpitations).  You pass out one time or more than once.  You have jerky movements  that you cannot control (seizure).  You are confused.  You have trouble walking.  You are very weak.  You have vision problems. These symptoms may be an emergency. Do not wait to see if the symptoms will go away. Get medical help right away. Call your local emergency services (911 in the [place] Do not drive yourself to the hospital.  This information is not intended to replace advice given to you by your health care provider. Make sure you discuss any questions you have with your health care provider. Document Released: 02/05/2008 Document Revised: 01/25/2016 Document Reviewed: 05/03/2015  2017 Elsevier  Information on my medicine - XARELTO (Rivaroxaban)  This medication education was reviewed with me or my healthcare representative as part of my discharge preparation.  The pharmacist that spoke with me during my hospital stay was:  Delane Ginger, [facility]  Why was Xarelto prescribed for you? Xarelto was prescribed for you to reduce the risk of a blood clot forming that can cause a stroke if you have a medical condition called atrial fibrillation (a type of irregular heartbeat).  What do you need to know about xarelto ? Take your Xarelto ONCE DAILY at the same time every day with your evening meal. If you have difficulty swallowing the tablet whole, you may crush it and mix in applesauce just prior to taking your dose.  Take Xarelto exactly as prescribed by your doctor and DO NOT stop taking Xarelto without talking to the doctor who prescribed the medication.  Stopping without other stroke  prevention medication to take the place of Xarelto may increase your risk of developing a clot that causes a stroke.  Refill your prescription before you run out.  After discharge, you should have regular check-up appointments with your healthcare provider that is prescribing your Xarelto.  In the future your dose may need to be changed if your kidney function or weight changes by a significant  amount.  What do you do if you miss a dose? If you are taking Xarelto ONCE DAILY and you miss a dose, take it as soon as you remember on the same day then continue your regularly scheduled once daily regimen the next day. Do not take two doses of Xarelto at the same time or on the same day.   Important Safety Information A possible side effect of Xarelto is bleeding. You should call your healthcare provider right away if you experience any of the following: ? Bleeding from an injury or your nose that does not stop. ? Unusual colored urine (red or dark brown) or unusual colored stools (red or black). ? Unusual bruising for unknown reasons. ? A serious fall or if you hit your head (even if there is no bleeding).  Some medicines may interact with Xarelto and might increase your risk of bleeding while on Xarelto. To help avoid this, consult your healthcare provider or pharmacist prior to using any new prescription or non-prescription medications, including herbals, vitamins, non-steroidal anti-inflammatory drugs (NSAIDs) and supplements.  This website has more information on Xarelto: [email or web address]

## 2016-10-13 NOTE — Evaluation (Signed)
Occupational Therapy Evaluation Patient Details Name: [name] [name] MRN: [id number] DOB: 05/16/1946 Today's Date: 10/13/2016    History of Present Illness Pt is a 71 y/o male admitted secondary to a syncopal episode and fall at home. PMH including but not limited to CAD, CHF, DM, HTN and COPD.    Clinical Impression   Pt reports he was independent with ADL PTA. Currently pt overall min guard for ADL and functional mobility. Pt without c/o dizziness this session but concerned about bil hand "aching and tingling". Educated on safety with bil hand sensation changes. Pt planning to d/c home alone with intermittent family support. Pt would benefit from continued skilled OT to address established goals.    Follow Up Recommendations  No OT follow up;Supervision - Intermittent    Equipment Recommendations  None recommended by OT    Recommendations for Other Services       Precautions / Restrictions Precautions Precautions: Fall Restrictions Weight Bearing Restrictions: No      Mobility Bed Mobility Overal bed mobility: Modified Independent             General bed mobility comments: increased time, HOB slightly elevated  Transfers Overall transfer level: Needs assistance Equipment used: None Transfers: Sit to/from Stand Sit to Stand: Min guard         General transfer comment: for safety, increased time.    Balance Overall balance assessment: Needs assistance;History of Falls Sitting-balance support: Feet supported;No upper extremity supported Sitting balance-Leahy Scale: Good     Standing balance support: No upper extremity supported;During functional activity Standing balance-Leahy Scale: Good                              ADL Overall ADL's : Needs assistance/impaired Eating/Feeding: Set up;Sitting   Grooming: Supervision/safety;Standing   Upper Body Bathing: Supervision/ safety;Sitting   Lower Body Bathing: Min guard;Sit to/from stand    Upper Body Dressing : Supervision/safety;Sitting   Lower Body Dressing: Min guard;Sit to/from stand   Toilet Transfer: Min guard;Ambulation;Regular Toilet           Functional mobility during ADLs: Min guard       Vision     Perception     Praxis      Pertinent Vitals/Pain Pain Assessment: Faces Faces Pain Scale: Hurts little more Pain Location: bil hands Pain Descriptors / Indicators: Aching;Tingling Pain Intervention(s): Monitored during session;Repositioned;Heat applied     Hand Dominance Right   Extremity/Trunk Assessment Upper Extremity Assessment Upper Extremity Assessment: Generalized weakness;RUE deficits/detail;LUE deficits/detail RUE Deficits / Details: reports bil tingling. Overall functional strength and coordination. LUE Deficits / Details: reports bil tingling. Overall functional strength and coordination.   Lower Extremity Assessment Lower Extremity Assessment: Defer to PT evaluation   Cervical / Trunk Assessment Cervical / Trunk Assessment: Normal   Communication Communication Communication: No difficulties   Cognition Arousal/Alertness: Awake/alert Behavior During Therapy: WFL for tasks assessed/performed Overall Cognitive Status: Within Functional Limits for tasks assessed                     General Comments       Exercises       Shoulder Instructions      Home Living Family/patient expects to be discharged to:: Private residence Living Arrangements: Alone Available Help at Discharge: Friend(s);Available PRN/intermittently Type of Home: House Home Access: Level entry     Home Layout: One level     Bathroom Shower/Tub: Tub/shower  unit Shower/tub characteristics: Architectural technologist: Standard     Home Equipment: None          Prior Functioning/Environment Level of Independence: Independent                 OT Problem List: Decreased strength;Impaired balance (sitting and/or standing);Decreased  safety awareness;Impaired sensation;Pain   OT Treatment/Interventions: Self-care/ADL training;Energy conservation;Therapeutic activities;Patient/family education    OT Goals(Current goals can be found in the care plan section) Acute Rehab OT Goals Patient Stated Goal: regain sensation in bilateral hands OT Goal Formulation: With patient Time For Goal Achievement: 10/27/16 Potential to Achieve Goals: Good ADL Goals Pt Will Perform Grooming: with modified independence;standing Pt Will Transfer to Toilet: with modified independence;ambulating;regular height toilet Pt Will Perform Toileting - Clothing Manipulation and hygiene: with modified independence;sit to/from stand Pt Will Perform Tub/Shower Transfer: Tub transfer;with modified independence;ambulating  OT Frequency: Min 2X/week   Barriers to D/C: Decreased caregiver support  lives alone       Co-evaluation              End of Session Nurse Communication: Mobility status  Activity Tolerance: Patient tolerated treatment well Patient left: in chair;with call bell/phone within reach;with chair alarm set   Time: 5909-3112 OT Time Calculation (min): 18 min Charges:  OT General Charges $OT Visit: 1 Procedure OT Evaluation $OT Eval Moderate Complexity: 1 Procedure G-Codes: OT G-codes **NOT FOR INPATIENT CLASS** Functional Assessment Tool Used: Clinical judgement Functional Limitation: Self care Self Care Current Status (T6244): At least 1 percent but less than 20 percent impaired, limited or restricted Self Care Goal Status (C9507): At least 1 percent but less than 20 percent impaired, limited or restricted   Binnie Kand M.S., OTR/L Pager: [phone]  10/13/2016, 11:16 AM

## 2016-10-13 NOTE — Progress Notes (Signed)
Pt spit up blood tinged mucus on napkin, small amount noted.  Notified Dr. Mallie Snooks.  Instructed to continue to monitor.  Karie Kirks, Therapist, sports.

## 2016-10-13 NOTE — Care Management Note (Signed)
Case Management Note  Patient Details  Name: [name] [name] MRN: [id number] Date of Birth: 1946-07-25  Subjective/Objective:                  syncope and hand numbness Action/Plan: Discharge planning Expected Discharge Date:                  Expected Discharge Plan:  [place]  In-House Referral:     Discharge planning Services  CM Consult  Post Acute Care Choice:  Home Health Choice offered to:  Patient  DME Arranged:  N/A DME Agency:  NA  HH Arranged:  RN [place] Agency:  [facility] (formerly [employer])  Status of Service:  Completed, signed off  If discussed at [employer], dates discussed:    Additional Comments: CM met with pt in room to offer choice of home health agency and pt [facility]. Referral called to [facility] rep, Louise. Pt states he has all the DME he needs at home. No other CM needs were communicated. Dellie Catholic, RN 10/13/2016, 3:44 PM

## 2016-10-13 NOTE — Care Management CC44 (Addendum)
Condition Code 44 Documentation Completed  Patient Details  Name: [name] [name] MRN: [id number] Date of Birth: 1946-07-20   Condition Code 89 given:   y Patient signature on Condition Code 80 notice: y   Documentation of 2 MD's agreement: y   Code 23 added to claim:   y    Delrae Sawyers, RN 10/13/2016, 4:00 PM

## 2016-10-13 NOTE — Progress Notes (Signed)
Patient alert and oriented. Slept mostly during the night. Patient complained of discomfort in his hands, used warm packs and that helped with discomfort. Will continue to monitor.   Lorianne Malbrough, RN

## 2016-10-13 NOTE — Care Management Obs Status (Signed)
[place] NOTIFICATION   Patient Details  Name: [name] [name] MRN: [id number] Date of Birth: 1946-07-30   Medicare Observation Status Notification Given:  Yes    CrutchfieldAntony Haste, RN 10/13/2016, 4:02 PM

## 2016-10-13 NOTE — Progress Notes (Signed)
Pt refused BP med this am SBP in 130's States That's low number and if I take my medicine it  Will go lowe.  Noted Bp 169/98 this afternoon.  Notified Dr. Mallie Snooks and instructed to give pt am BP med and if pt refused again to call.  Notified pt and agreed to take his BP med.  Also not further coughing up of blood noted.  Will continue to monitor.   Karie Kirks, Therapist, sports.

## 2016-10-15 ENCOUNTER — Telehealth: Payer: Self-pay

## 2016-10-15 NOTE — Telephone Encounter (Signed)
Spoke with [name] [name]. She said patient does not qualify for home health aide unless he is receiving other services (OT/PT). Called patient but did not get answer. Called patient's son and advised him to ask about other home help options perhaps through other agencies at his follow-up with PCP tomorrow.   Olene Floss, MD [place], PGY-2

## 2016-10-15 NOTE — Telephone Encounter (Signed)
Will forward to Dr. Ola Spurr who saw patient in the hospital. [facility]

## 2016-10-15 NOTE — Telephone Encounter (Signed)
Dana Allan from [facility] has questions about a referral for [name]. Unclear as to what is needed. Please call [phone]. Ottis Stain, CMA

## 2016-10-15 NOTE — Discharge Summary (Signed)
[facility] Discharge Summary  Patient name: [name] [name] Medical record number: [id number] Date of birth: 06-11-1946 Age: 71 y.o. Gender: male Date of Admission: 10/11/2016  Date of Discharge: 10/13/2016 Admitting Physician: Blane Ohara McDiarmid, MD  Primary Care Provider: Vicenta Aly, FNP Consultants: Neurology  Indication for Hospitalization: Syncope and hand numbness  Discharge Diagnoses/Problem List:  Patient Active Problem List   Diagnosis Date Noted  . 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 Head trauma, subsequent encounter 10/12/2016  . 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 Spinal stenosis in cervical region 10/12/2016  . Cervical spondylosis 10/12/2016  . Syncope and collapse 10/12/2016  . Fall   . Syncope 10/11/2016  . CAD (coronary artery disease)   . Chronic diastolic CHF (congestive heart failure) ([place])   . Hyperlipidemia   . Hypertensive heart disease   . Paroxysmal atrial fibrillation ([place]) 12/17/2015  . Multiple myeloma ([place]) 12/17/2015  . Mild intermittent asthma 10/18/2015  . Aortic root dilatation ([place]) 10/17/2015  . Diabetes ([place]) 10/17/2015  . 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 Hand paresthesia 12/09/2014  . Elevated prostate specific antigen (PSA) 07/12/2014  . Allergic rhinitis 07/12/2014  . LBP (low back pain) 07/12/2014  . Palpitation 07/11/2014  . Patient's other noncompliance with medication regimen 08/10/2013  . Chronic venous insufficiency 07/23/2012  . Obstructive apnea 04/11/2011  . Essential (primary) hypertension 09/07/2010  . 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 Anxiety, generalized 04/04/2010  . Cardiac conduction disorder 02/27/2010  . Benign prostatic hypertrophy without urinary obstruction 09/13/2009  . Abnormal findings on examination of genitourinary organs  08/09/2009  . Hereditary and idiopathic neuropathy 07/05/2009     Disposition: Home  Discharge Condition: Stable  Discharge Exam: See note from day of discharge  Brief Hospital Course:  Patient is a 71 year old male who presented with syncope and hand numbness. The patient was noted to be bradycardic to the 50s on admission. CT head showed no bleed or acute findings. This was not a new problem; the patient noted that he has syncope several times per week always after eating. This is been going on for over a year. Postprandial syncope was most likely however cardiogenic versus neurogenic versus psychogenic were also considered.  Radiographic etiology is unlikely with the following tests. Troponins were followed a negative 3. Orthostatics are positive. Cardiac echo was performed and found to have 60-65% ejection fraction G2DD, and severe concentric hypertrophy. Patient was monitored on telemetry with no acute events. EKG on admission showed sinus rhythm with some premature atrial complexes and some T-wave changes. Mildly prolonged QTc 453.  The patient's primary concern on hospitalized was his hand pain with paresthesias. This was bilateral and came on after syncope and fall that led him to admission. Neurosurgery was consult head and did not recommend surgical intervention. CT spine showed evidence of D JVD and osteophytes. Hand pain bilaterally suggestive of nerve etiology may have been indicative of a central cord syndrome that came on after he hit his head. Gabapentin was titrated up to 600 mg 3 times a day.  The other of the patient's chronic medical problems were stable.  Issues for Follow Up:  1. Hand pain-patient was discharged on gabapentin 600 mg 3 times a day 2. Syncope-PT evaluated patient prior to discharge and saw no need for follow-up. Most likely etiologies postprandial syncope. Patient advised to remain seated or in  a safe place after eating.  Significant Procedures:  None  Significant Labs and Imaging:   Recent Labs Lab 10/11/16 1354 10/12/16 0800 10/13/16 0630  WBC 6.6 8.8 9.2  HGB 12.7* 12.1* 12.5*  HCT 37.4* 36.7* 37.7*  PLT 255 266 260    Recent Labs Lab 10/11/16 1354 10/12/16 0800 10/13/16 0630  NA 136 137 136  K 3.4* 3.1* 3.6  CL 104 103 105  CO2 _0 GLUCOSE 124* 94 154*  BUN _1 CREATININE 0.98 1.02 0.98  CALCIUM 8.9 8.8* 8.8*  MG  --  1.9  --   PHOS  --  3.1  --       Results/Tests Pending at Time of Discharge: None  Discharge Medications:  Allergies as of 10/13/2016      Reactions   Amoxicillin Other (See Comments)   Can't move Has patient had a PCN reaction causing immediate rash, facial/tongue/throat swelling, SOB or lightheadedness with hypotension: No Has patient had a PCN reaction causing severe rash involving mucus membranes or skin necrosis: No Has patient had a PCN reaction that required hospitalization No Has patient had a PCN reaction occurring within the last 10 years: No If all of the above answers are "NO", then may proceed with Cephalosporin use.   Penicillins Other (See Comments)   Can't move/dizziness Has patient had a PCN reaction causing immediate rash, facial/tongue/throat swelling, SOB or lightheadedness with hypotension: No Has patient had a PCN reaction causing severe rash involving mucus membranes or skin necrosis: No Has patient had a PCN reaction that required hospitalization No Has patient had a PCN reaction occurring within the last 10 years: No If all of the above answers are "NO", then may proceed with Cephalosporin use.      Medication List    STOP taking these medications   ALLERGY PO     TAKE these medications   albuterol 108 (90 Base) MCG/ACT inhaler Commonly known as:  PROVENTIL HFA;VENTOLIN HFA Inhale 2 puffs into the lungs every 6 (six) hours as needed for wheezing or shortness of breath.   albuterol (5 MG/ML) 0.5% nebulizer solution Commonly known as:   PROVENTIL Take 0.5 mLs (2.5 mg total) by nebulization every 6 (six) hours as needed for wheezing or shortness of breath.   amLODipine 10 MG tablet Commonly known as:  NORVASC Take 10 mg by mouth 2 (two) times daily.   atorvastatin 40 MG tablet Commonly known as:  LIPITOR Take 1 tablet (40 mg total) by mouth at bedtime.   gabapentin 600 MG tablet Commonly known as:  NEURONTIN Take 1 tablet (600 mg total) by mouth 3 (three) times daily.   KLOR-CON M20 20 MEQ tablet Generic drug:  potassium chloride SA TAKE 1 TABLET TWICE A DAY   montelukast 10 MG tablet Commonly known as:  SINGULAIR Take 10 mg by mouth at bedtime.   rivaroxaban 20 MG Tabs tablet Commonly known as:  XARELTO Take 1 tablet (20 mg total) by mouth daily with supper.       Discharge Instructions: Please refer to Patient Instructions section of EMR for full details.  Patient was counseled important signs and symptoms that should prompt return to medical care, changes in medications, dietary instructions, activity restrictions, and follow up appointments.   Follow-Up Appointments: Follow-up Information    [name],[name], FNP. Call on 10/22/2016.   Specialty:  Nurse Practitioner Why:  Please call and make an appointment to be seen by your PCP within the next  7 days._0 :00pm Contact information: [place] [address] [phone]        [facility] Follow up.   Specialty:  Home Health Services Why:  home health nurse Contact information: [place] [address] [phone]           Everrett Coombe, MD 10/15/2016, 9:36 PM PGY-1, [place]

## 2016-10-31 ENCOUNTER — Telehealth: Payer: Self-pay | Admitting: Cardiology

## 2016-10-31 NOTE — Telephone Encounter (Signed)
10/31/2016 Received faxed records from [facility] for upcoming appointment on 11/04/2016 with Dr. Percival Spanish.  Records given to [facility].  cbr

## 2016-11-03 NOTE — Progress Notes (Signed)
HPI The patient presents for evaluation of aortic root dilatation, aortic insufficiency and atrial fib.  Since I last saw him was in the hospital for syncope.    I reviewed his hospital records.  He had positive orthostatic changes and an echo with normal EF and severe concentric hypertrophy.  He was seen by neurosurgery because of hand numbness and DJD of the CT spine.  Gabapentin was increased.  He had cath with disease as below last year.  He also has had paroxysmal atrial fib.    Since then he says he's been passing out. However, when I told him he couldn't drive he actually changed that story and says he's not really passing out he thinks his sleep is causing him to have episodes where he falls asleep maybe. He was going to have a sleep appointment with Dr. Claiborne [name] but he was a no-show. He was going to have mask titration but he didn't make that appointment. He has since called him and is going back to have a different mask fit. He's not having any chest pressure, neck or arm discomfort. He is fatigued. He's not having any new shortness of breath, PND or orthopnea. He says that the episodes of passing out her only when he is seated or eating. Again he is not describing frank syncope.    Allergies  Allergen Reactions  . Amoxicillin Other (See Comments)    Can't move Has patient had a PCN reaction causing immediate rash, facial/tongue/throat swelling, SOB or lightheadedness with hypotension: No Has patient had a PCN reaction causing severe rash involving mucus membranes or skin necrosis: No Has patient had a PCN reaction that required hospitalization No Has patient had a PCN reaction occurring within the last 10 years: No If all of the above answers are "NO", then may proceed with Cephalosporin use.   [name] Penicillins Other (See Comments)    Can't move/dizziness Has patient had a PCN reaction causing immediate rash, facial/tongue/throat swelling, SOB or lightheadedness with hypotension: No Has  patient had a PCN reaction causing severe rash involving mucus membranes or skin necrosis: No Has patient had a PCN reaction that required hospitalization No Has patient had a PCN reaction occurring within the last 10 years: No If all of the above answers are "NO", then may proceed with Cephalosporin use.     Current Outpatient Prescriptions  Medication Sig Dispense Refill  . albuterol (PROVENTIL HFA;VENTOLIN HFA) 108 (90 Base) MCG/ACT inhaler Inhale 2 puffs into the lungs every 6 (six) hours as needed for wheezing or shortness of breath. 1 Inhaler 3  . albuterol (PROVENTIL) (5 MG/ML) 0.5% nebulizer solution Take 0.5 mLs (2.5 mg total) by nebulization every 6 (six) hours as needed for wheezing or shortness of breath. 20 mL 12  . amLODipine (NORVASC) 10 MG tablet Take 10 mg by mouth 2 (two) times daily.     [name] atorvastatin (LIPITOR) 80 MG tablet Take 1 tablet (80 mg total) by mouth at bedtime. 90 tablet 3  . gabapentin (NEURONTIN) 600 MG tablet Take 1 tablet (600 mg total) by mouth 3 (three) times daily. 90 tablet 0  . KLOR-CON M20 20 MEQ tablet TAKE 1 TABLET TWICE A DAY 60 tablet 6  . montelukast (SINGULAIR) 10 MG tablet Take 10 mg by mouth at bedtime.     . rivaroxaban (XARELTO) 20 MG TABS tablet Take 1 tablet (20 mg total) by mouth daily with supper. 30 tablet 3   No current facility-administered medications for this visit.  Past Medical History:  Diagnosis Date  . Anxiety   . Aortic root dilatation ([place])    a. 12/2015 Echo: mod dil of sinus of valsalva and Ao root (41mm).  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 CAD (coronary artery disease)    a. 12/2015 NSTEMI/Cath (in setting of PAF):  LM nl, LAD 30p,  LCX 21m, RCA  ok, AM 100%, RPDA1 40, RPDA 2 60 ->Med Rx.  . Cervical pain 12/09/2014  . Childhood asthma   . Chronic diastolic CHF (congestive heart failure) ([place])    a. 12/2015 Echo: EF 50-55%, mod  AI, mod Ao root dil, mild MR, mod dil LA, mod RA.  [name] COPD (chronic obstructive pulmonary disease) ([place])   . Diabetes mellitus type II   . History of syncope   . Hyperlipidemia   . Hypertensive heart disease   . Kidney lump 04/04/2010   Overview:  Renal Cell Carcinoma   . Moderate aortic insufficiency    a. 12/2015 Echo: Mod AI.  [name] Paroxysmal atrial fibrillation ([place])    a. 12/2015 started on Xarelto (CHA2DS2VASc = 4-5).  . Pneumonia   . Prostate cancer ([place])   . Renal cell carcinoma ([place])   . Renal lesion   . Sleep apnea    Does not use CPAP  . Spinal stenosis in cervical region 10/12/2016    Past Surgical History:  Procedure Laterality Date  . BACK SURGERY    . CARDIAC CATHETERIZATION N/A 12/18/2015   Procedure: Left Heart Cath and Coronary Angiography;  Surgeon: Leonie Man, MD;  Location: [facility];  Service: Cardiovascular;  Laterality: N/A;  . LUMBAR [place]   "replaced a disc"     ROS:  As stated in the HPI and negative for all other systems.  PHYSICAL EXAM BP 122/88 (BP Location: Left Arm, Patient Position: Sitting, Cuff Size: Normal)   Pulse 66   Ht 5\' 9"  (1.753 m)   Wt 233 lb (105.7 kg)   BMI 34.41 kg/m   GENERAL:  Well appearing HEENT:  Pupils equal round and reactive, fundi not visualized, oral mucosa unremarkable, proptosis, poor dentition NECK:  No jugular venous distention, waveform within normal limits, carotid upstroke brisk and symmetric, right bruit, no thyromegaly LUNGS:  Clear CHEST:  Unremarkable HEART:  PMI not displaced or sustained,S1 and S2 within normal limits, no S3, no S4, no clicks, no rubs, diastolic murmur at the left 3rd intercostal space, short systolic murmur ABD:  Flat, positive bowel sounds normal in frequency in pitch, no bruits, no rebound, no guarding, no midline pulsatile mass, no hepatomegaly, no splenomegaly EXT:  2 plus pulses throughout, mild leg edema, no cyanosis, sores on the lower legs healed   Lab  Results  Component Value Date   CHOL 210 (H) 10/11/2016   TRIG 136 10/11/2016   HDL 63 10/11/2016   LDLCALC 120 (H) 10/11/2016    ASSESSMENT AND PLAN  Syncope:  He was told he cannot drive.  At this point he might just be falling asleep however. I am going to get carotid Dopplers because of bruit. He's going to have his CPAP changed to see if all this helps.  Chronic diastolic congestive heart failure:    He seems to be euvolemic.  No change in therapy.   AI:    This was moderate on echo in February I'll follow this clinically.c  Paroxysmal atrial  fibrillation:     [name] has a CHA2DS2 - VASc score of 3. with a risk of stroke of 3.2%. He's not describing significant palpitations and he will continue with his anticoagulation.   Hypertensive heart disease:   His blood pressure is well controlled.  He will continue the meds as listed.  Dilated aortic root: He tried to have his MRI but couldn't stay in the machine. I will try to follow this in the future with CTs.  I will schedule a CTA for 6 months.  Coronary artery disease: He has not been having any chest pain. Catheterization last year revealed total occlusion of the acute marginal with otherwise nonobstructive disease. He remains on beta blocker and statin therapy. He will continue on current therapy.   Hyperlipidemia: LDL was 120 last month.  I will increase the Lipitor to 80 mg.  Sleep Apnea:  He says he has rescheduled his mask refitting. I encouraged this as I think this is causing some of his symptoms.[name]  Hospital records reviewed.  (Greater than 40 minutes reviewing all data with greater than 50% face to face with the patient).

## 2016-11-04 ENCOUNTER — Ambulatory Visit (INDEPENDENT_AMBULATORY_CARE_PROVIDER_SITE_OTHER): Payer: Medicare Other | Admitting: Cardiology

## 2016-11-04 ENCOUNTER — Encounter: Payer: Self-pay | Admitting: Cardiology

## 2016-11-04 VITALS — BP 122/88 | HR 66 | Ht 69.0 in | Wt 233.0 lb

## 2016-11-04 DIAGNOSIS — R55 Syncope and collapse: Secondary | ICD-10-CM | POA: Diagnosis not present

## 2016-11-04 DIAGNOSIS — I480 Paroxysmal atrial fibrillation: Secondary | ICD-10-CM | POA: Diagnosis not present

## 2016-11-04 DIAGNOSIS — I2510 Atherosclerotic heart disease of native coronary artery without angina pectoris: Secondary | ICD-10-CM | POA: Diagnosis not present

## 2016-11-04 DIAGNOSIS — I77810 Thoracic aortic ectasia: Secondary | ICD-10-CM | POA: Diagnosis not present

## 2016-11-04 DIAGNOSIS — R0989 Other specified symptoms and signs involving the circulatory and respiratory systems: Secondary | ICD-10-CM

## 2016-11-04 MED ORDER — ATORVASTATIN CALCIUM 80 MG PO TABS: 80.0000 mg | ORAL_TABLET | Freq: Every day | ORAL | 3 refills | Status: DC

## 2016-11-04 NOTE — Patient Instructions (Signed)
Medication Instructions:  Continue current medications  Labwork: None Ordered  Testing/Procedures: Your physician has requested that you have a carotid duplex. This test is an ultrasound of the carotid arteries in your neck. It looks at blood flow through these arteries that supply the brain with blood. Allow one hour for this exam. There are no restrictions or special instructions.  Your physician has requested that you have cardiac CT of Aorta in 6 Months. Cardiac computed tomography (CT) is a painless test that uses an x-ray machine to take clear, detailed pictures of your heart. For further information please visit [email or web address]. Please follow instruction sheet as given.   Follow-Up: Your physician wants you to follow-up in: 6 Months. You will receive a reminder letter in the mail two months in advance. If you don't receive a letter, please call our office to schedule the follow-up appointment.   Any Other Special Instructions Will Be Listed Below (If Applicable).   If you need a refill on your cardiac medications before your next appointment, please call your pharmacy.

## 2016-11-10 ENCOUNTER — Other Ambulatory Visit: Payer: Self-pay | Admitting: Internal Medicine

## 2016-11-18 ENCOUNTER — Inpatient Hospital Stay (HOSPITAL_COMMUNITY): Admission: RE | Admit: 2016-11-18 | Payer: Medicare Other | Source: Ambulatory Visit

## 2016-11-28 ENCOUNTER — Ambulatory Visit (INDEPENDENT_AMBULATORY_CARE_PROVIDER_SITE_OTHER): Payer: Medicare Other

## 2016-11-28 ENCOUNTER — Encounter: Payer: Self-pay | Admitting: Podiatry

## 2016-11-28 ENCOUNTER — Ambulatory Visit (INDEPENDENT_AMBULATORY_CARE_PROVIDER_SITE_OTHER): Payer: Medicare Other | Admitting: Podiatry

## 2016-11-28 DIAGNOSIS — M722 Plantar fascial fibromatosis: Secondary | ICD-10-CM | POA: Diagnosis not present

## 2016-11-28 DIAGNOSIS — Q828 Other specified congenital malformations of skin: Secondary | ICD-10-CM | POA: Diagnosis not present

## 2016-11-28 DIAGNOSIS — M79672 Pain in left foot: Secondary | ICD-10-CM | POA: Diagnosis not present

## 2016-11-29 NOTE — Progress Notes (Signed)
Subjective:     Patient ID: [name] [name], male   DOB: 1946/05/21, 71 y.o.   MRN: [id number]  HPI patient points the bottom of the left heel states it's been hurting a lot and making it difficult to walk   Review of Systems     Objective:   Physical Exam Neurovascular status intact with significant keratotic lesion sub-heel left    Assessment:     Porokeratotic lesion with also plantar fascial-like symptomatology in the medial and plantar heel    Plan:    reviewed both conditions and discussed plantar fasciitis supportive shoe gear therapy not going barefoot and today I debrided the lesion applied medication to it with padding and will see back if symptoms persist

## 2016-12-02 ENCOUNTER — Other Ambulatory Visit: Payer: Self-pay | Admitting: Nurse Practitioner

## 2016-12-02 NOTE — Telephone Encounter (Signed)
Rx request sent to pharmacy.  

## 2016-12-26 ENCOUNTER — Institutional Professional Consult (permissible substitution): Payer: Medicare Other | Admitting: Pulmonary Disease

## 2016-12-27 ENCOUNTER — Inpatient Hospital Stay (HOSPITAL_COMMUNITY): Admission: RE | Admit: 2016-12-27 | Payer: Medicare Other | Source: Ambulatory Visit

## 2017-02-18 ENCOUNTER — Encounter: Payer: Self-pay | Admitting: Pulmonary Disease

## 2017-02-18 ENCOUNTER — Ambulatory Visit (INDEPENDENT_AMBULATORY_CARE_PROVIDER_SITE_OTHER): Payer: Medicare Other | Admitting: Pulmonary Disease

## 2017-02-18 DIAGNOSIS — G4733 Obstructive sleep apnea (adult) (pediatric): Secondary | ICD-10-CM | POA: Diagnosis not present

## 2017-02-18 NOTE — Assessment & Plan Note (Addendum)
He seems to have mild OSA, mostly REM related. The cardiovascular implication of mild OSA is minimal if any. I think his degree of OSA has improved because of his weight loss.  As such, I do not feel that he needs CPAP therapy or oral appliance at this time. His episode of syncope in February 2018 does not seem to have been related to OSA. His daytime sleepiness is unlikely to be caused by this degree of OSA. It is more likely that Neurontin may be contributing to this  He will follow-up with [place] on an as-needed basis

## 2017-02-18 NOTE — Progress Notes (Signed)
Subjective:    Patient ID: [name] [name], male    DOB: 10-26-1945, 71 y.o.   MRN: [id number]  HPI  Chief Complaint  Patient presents with  . Sleep Consult    No history of a sleep study. Self referral for OSA. Has a CPAP machine at home, but is it old.     71 year old hypertensive and diabetic presents for evaluation of obstructive sleep apnea. He reports a diagnosis of OSA several years ago and his CPAP machine at home which is at least 73 years old. He has stopped using this for many years.  I note to PSG is in her records PSG 03/2015 shows mild OSA 9/hour mostly REM related events PSG 07/2016 again shows AHI of 9/hour with lowest desaturation of 80%, his weight then was 237 pounds. He does report a weight loss of 100 pounds over the last few years and he says that his waist has gone down to a size 40 from size 44.  Epworth sleepiness score is 15 and a report sleepiness while sitting and reading, watching TV, as a passenger in a car or lying down to rest in the afternoons. Bedtime is around midnight, sleep latency is up to 30 minutes, he sleeps on his back with 4 pillows, he is always had some degree of orthopnea, reports 5-6 nocturnal awakenings but denies nocturia and is out of bed by 6 AM feeling rested with occasional dryness of mouth   He was hospitalized in February 2018 for a syncopal episode with bradycardia, no clear diagnosis was made. He had an indwelling Foley for prostate enlargement and still reports polyuria and increased frequency.     Past Medical History:  Diagnosis Date  . Anxiety   . Aortic root dilatation ([place])    a. 12/2015 Echo: mod dil of sinus of valsalva and Ao root (21mm).  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 CAD (coronary artery disease)    a. 12/2015 NSTEMI/Cath (in setting of PAF):  LM nl, LAD 30p,  LCX 55m, RCA  ok, AM 100%, RPDA1 40, RPDA 2 60 ->Med Rx.  .  Cervical pain 12/09/2014  . Childhood asthma   . Chronic diastolic CHF (congestive heart failure) ([place])    a. 12/2015 Echo: EF 50-55%, mod AI, mod Ao root dil, mild MR, mod dil LA, mod RA.  [name] COPD (chronic obstructive pulmonary disease) ([place])   . Diabetes mellitus type II   . History of syncope   . Hyperlipidemia   . Hypertensive heart disease   . Kidney lump 04/04/2010   Overview:  Renal Cell Carcinoma   . Moderate aortic insufficiency    a. 12/2015 Echo: Mod AI.  [name] Paroxysmal atrial fibrillation ([place])    a. 12/2015 started on Xarelto (CHA2DS2VASc = 4-5).  . Pneumonia   . Prostate cancer ([place])   . Renal cell carcinoma ([place])   . Renal lesion   . Sleep apnea    Does not use CPAP  . Spinal stenosis in cervical region 10/12/2016    Past Surgical History:  Procedure Laterality Date  . BACK SURGERY    . CARDIAC CATHETERIZATION N/A 12/18/2015   Procedure: Left Heart Cath and Coronary Angiography;  Surgeon: Leonie Man, MD;  Location: [facility];  Service: Cardiovascular;  Laterality: N/A;  . LUMBAR [place]   "replaced a disc"     Allergies  Allergen  Reactions  . Amoxicillin Other (See Comments)    Can't move Has patient had a PCN reaction causing immediate rash, facial/tongue/throat swelling, SOB or lightheadedness with hypotension: No Has patient had a PCN reaction causing severe rash involving mucus membranes or skin necrosis: No Has patient had a PCN reaction that required hospitalization No Has patient had a PCN reaction occurring within the last 10 years: No If all of the above answers are "NO", then may proceed with Cephalosporin use.   [name] Penicillins Other (See Comments)    Can't move/dizziness Has patient had a PCN reaction causing immediate rash, facial/tongue/throat swelling, SOB or lightheadedness with hypotension: No Has patient had a PCN reaction causing severe rash involving mucus membranes or skin necrosis: No Has patient had a PCN reaction that  required hospitalization No Has patient had a PCN reaction occurring within the last 10 years: No If all of the above answers are "NO", then may proceed with Cephalosporin use.      Social History   Social History  . Marital status: Widowed    Spouse name: N/A  . Number of children: N/A  . Years of education: N/A   Occupational History  . Not on file.   Social History Main Topics  . Smoking status: Former Smoker    Types: Cigars  . Smokeless tobacco: Never Used     Comment: "quit smoking in the 1970s"  . Alcohol use Yes     Comment: "stopped drinking alcohol in ~ 1980; just drank a little on the weekends when I did drink"  . Drug use: No  . Sexual activity: No   Other Topics Concern  . Not on file   Social History Narrative   Lives alone.  Wife died in 2023-04-14.  Lives in apartment.       Family History  Problem Relation Age of Onset  . Emphysema Father   . Asthma Father   . Heart disease Mother        No details.  Still alive age 15    Review of Systems  Constitutional: Negative for fever and unexpected weight change.  HENT: Negative for congestion, dental problem, ear pain, nosebleeds, postnasal drip, rhinorrhea, sinus pressure, sneezing, sore throat and trouble swallowing.   Eyes: Negative for redness and itching.  Respiratory: Negative for cough, chest tightness, shortness of breath and wheezing.   Cardiovascular: Negative for palpitations and leg swelling.  Gastrointestinal: Negative for nausea and vomiting.  Genitourinary: Negative for dysuria.  Musculoskeletal: Negative for joint swelling.  Skin: Negative for rash.  Allergic/Immunologic: Negative.  Negative for environmental allergies, food allergies and immunocompromised state.  Neurological: Negative for headaches.  Hematological: Does not bruise/bleed easily.  Psychiatric/Behavioral: Negative for dysphoric mood. The patient is not nervous/anxious.        Objective:   Physical Exam  Gen. Pleasant,  obese, in no distress, normal affect ENT - no lesions, no post nasal drip, class 2-3 airway Neck: No JVD, no thyromegaly, no carotid bruits Lungs: no use of accessory muscles, no dullness to percussion, decreased without rales or rhonchi  Cardiovascular: Rhythm regular, heart sounds  normal, no murmurs or gallops, no peripheral edema Abdomen: soft and non-tender, no hepatosplenomegaly, BS normal. Musculoskeletal: No deformities, no cyanosis or clubbing Neuro:  alert, non focal, no tremors       Assessment & Plan:

## 2017-02-18 NOTE — Patient Instructions (Signed)
You have mild obstructive sleep apnea especially during REM sleep. This is improved with weight loss  Try to stay active

## 2017-03-06 ENCOUNTER — Ambulatory Visit (HOSPITAL_COMMUNITY)
Admission: RE | Admit: 2017-03-06 | Discharge: 2017-03-06 | Disposition: A | Discharge status: Home / Self Care | Payer: Medicare Other | Source: Ambulatory Visit | Attending: Internal Medicine | Admitting: Internal Medicine

## 2017-03-06 DIAGNOSIS — R0989 Other specified symptoms and signs involving the circulatory and respiratory systems: Secondary | ICD-10-CM | POA: Diagnosis not present

## 2017-04-14 ENCOUNTER — Encounter: Payer: Self-pay | Admitting: Cardiology

## 2017-04-29 NOTE — Progress Notes (Deleted)
HPI The patient presents for evaluation of aortic root dilatation, aortic insufficiency and atrial fib.  He was in the hospital for syncope earlier this year.   He had positive orthostatic changes and an echo with normal EF and severe concentric hypertrophy.  He was seen by neurosurgery because of hand numbness and DJD of the CT spine.  Gabapentin was increased.  He had cath with disease as below last year.  He also has had paroxysmal atrial fib.  Since I last saw him he has had had a negative carotid Doppler and he has had follow up for sleep apnea.   ***  Since then he says he's been passing out. However, when I told him he couldn't drive he actually changed that story and says he's not really passing out he thinks his sleep is causing him to have episodes where he falls asleep maybe. He was going to have a sleep appointment with Dr. Claiborne Billings but he was a no-show. He was going to have mask titration but he didn't make that appointment. He has since called him and is going back to have a different mask fit. He's not having any chest pressure, neck or arm discomfort. He is fatigued. He's not having any new shortness of breath, PND or orthopnea. He says that the episodes of passing out her only when he is seated or eating. Again he is not describing frank syncope.    Allergies  Allergen Reactions  . Amoxicillin Other (See Comments)    Can't move Has patient had a PCN reaction causing immediate rash, facial/tongue/throat swelling, SOB or lightheadedness with hypotension: No Has patient had a PCN reaction causing severe rash involving mucus membranes or skin necrosis: No Has patient had a PCN reaction that required hospitalization No Has patient had a PCN reaction occurring within the last 10 years: No If all of the above answers are "NO", then may proceed with Cephalosporin use.   [name] Penicillins Other (See Comments)    Can't move/dizziness Has patient had a PCN reaction causing immediate rash,  facial/tongue/throat swelling, SOB or lightheadedness with hypotension: No Has patient had a PCN reaction causing severe rash involving mucus membranes or skin necrosis: No Has patient had a PCN reaction that required hospitalization No Has patient had a PCN reaction occurring within the last 10 years: No If all of the above answers are "NO", then may proceed with Cephalosporin use.     Current Outpatient Prescriptions  Medication Sig Dispense Refill  . albuterol (PROVENTIL HFA;VENTOLIN HFA) 108 (90 Base) MCG/ACT inhaler Inhale 2 puffs into the lungs every 6 (six) hours as needed for wheezing or shortness of breath. 1 Inhaler 3  . albuterol (PROVENTIL) (5 MG/ML) 0.5% nebulizer solution Take 0.5 mLs (2.5 mg total) by nebulization every 6 (six) hours as needed for wheezing or shortness of breath. 20 mL 12  . amLODipine (NORVASC) 10 MG tablet Take 10 mg by mouth 2 (two) times daily.     [name] atorvastatin (LIPITOR) 80 MG tablet Take 1 tablet (80 mg total) by mouth at bedtime. 90 tablet 3  . furosemide (LASIX) 20 MG tablet TAKE 1 TABLET (20 MG TOTAL) BY MOUTH DAILY. 30 tablet 6  . gabapentin (NEURONTIN) 600 MG tablet Take 1 tablet (600 mg total) by mouth 3 (three) times daily. 90 tablet 0  . KLOR-CON M20 20 MEQ tablet TAKE 1 TABLET TWICE A DAY 60 tablet 6  . montelukast (SINGULAIR) 10 MG tablet Take 10 mg by mouth at  bedtime.     . rivaroxaban (XARELTO) 20 MG TABS tablet Take 1 tablet (20 mg total) by mouth daily with supper. 30 tablet 3   No current facility-administered medications for this visit.     Past Medical History:  Diagnosis Date  . Anxiety   . Aortic root dilatation ([place])    a. 12/2015 Echo: mod dil of sinus of valsalva and Ao root (64mm).  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 CAD (coronary artery disease)    a. 12/2015 NSTEMI/Cath (in setting of PAF):  LM nl, LAD 30p,  LCX 50m, RCA  ok, AM 100%,  RPDA1 40, RPDA 2 60 ->Med Rx.  . Cervical pain 12/09/2014  . Childhood asthma   . Chronic diastolic CHF (congestive heart failure) ([place])    a. 12/2015 Echo: EF 50-55%, mod AI, mod Ao root dil, mild MR, mod dil LA, mod RA.  [name] COPD (chronic obstructive pulmonary disease) ([place])   . Diabetes mellitus type II   . History of syncope   . Hyperlipidemia   . Hypertensive heart disease   . Kidney lump 04/04/2010   Overview:  Renal Cell Carcinoma   . Moderate aortic insufficiency    a. 12/2015 Echo: Mod AI.  [name] Paroxysmal atrial fibrillation ([place])    a. 12/2015 started on Xarelto (CHA2DS2VASc = 4-5).  . Pneumonia   . Prostate cancer ([place])   . Renal cell carcinoma ([place])   . Renal lesion   . Sleep apnea    Does not use CPAP  . Spinal stenosis in cervical region 10/12/2016    Past Surgical History:  Procedure Laterality Date  . BACK SURGERY    . CARDIAC CATHETERIZATION N/A 12/18/2015   Procedure: Left Heart Cath and Coronary Angiography;  Surgeon: Leonie Man, MD;  Location: [facility];  Service: Cardiovascular;  Laterality: N/A;  . LUMBAR [place]   "replaced a disc"     ROS:  ***  PHYSICAL EXAM There were no vitals taken for this visit.  GENERAL:  Well appearing NECK:  No jugular venous distention, waveform within normal limits, carotid upstroke brisk and symmetric, no bruits, no thyromegaly LUNGS:  Clear to auscultation bilaterally CHEST:  Unremarkable HEART:  PMI not displaced or sustained,S1 and S2 within normal limits, no S3, no S4, no clicks, no rubs, *** murmurs ABD:  Flat, positive bowel sounds normal in frequency in pitch, no bruits, no rebound, no guarding, no midline pulsatile mass, no hepatomegaly, no splenomegaly EXT:  2 plus pulses throughout, no edema, no cyanosis no clubbing   GENERAL:  Well appearing HEENT:  Pupils equal round and reactive, fundi not visualized, oral mucosa unremarkable, proptosis, poor dentition NECK:  No jugular venous distention,  waveform within normal limits, carotid upstroke brisk and symmetric, right bruit, no thyromegaly LUNGS:  Clear CHEST:  Unremarkable HEART:  PMI not displaced or sustained,S1 and S2 within normal limits, no S3, no S4, no clicks, no rubs, diastolic murmur at the left 3rd intercostal space, short systolic murmur ABD:  Flat, positive bowel sounds normal in frequency in pitch, no bruits, no rebound, no guarding, no midline pulsatile mass, no hepatomegaly, no splenomegaly EXT:  2 plus pulses throughout, mild leg edema, no cyanosis, sores on the lower legs healed   EKG:  ***  Lab Results  Component Value Date   CHOL 210 (H) 10/11/2016   TRIG 136 10/11/2016  HDL 63 10/11/2016   LDLCALC 120 (H) 10/11/2016    ASSESSMENT AND PLAN  Syncope:  ***  He was told he cannot drive.  At this point he might just be falling asleep however. I am going to get carotid Dopplers because of bruit. He's going to have his CPAP changed to see if all this helps.  Chronic diastolic congestive heart failure:  ***  He seems to be euvolemic.  No change in therapy.   AI:  ***   This was moderate on echo in February I'll follow this clinically.c  Paroxysmal atrial fibrillation:  ***   Mr. Khayree Delellis Moser has a CHA2DS2 - VASc score of 3. with a risk of stroke of 3.2%. He's not describing significant palpitations and he will continue with his anticoagulation.   Hypertensive heart disease:  ***  His blood pressure is well controlled.  He will continue the meds as listed.  Dilated aortic root: He tried to have his MRI but couldn't stay in the machine. *** I will try to follow this in the future with CTs.  I will schedule a CTA for 6 months.  Coronary artery disease:  ***   He has not been having any chest pain. Catheterization last year revealed total occlusion of the acute marginal with otherwise nonobstructive disease. He remains on beta blocker and statin therapy. He will continue on current therapy.   Hyperlipidemia:  ***  LDL was 120 last month.  I will increase the Lipitor to 80 mg.  Sleep Apnea:   He has been seen by pulmonary.  ***  He says he has rescheduled his mask refitting. I encouraged this as I think this is causing some of his symptoms.[name]

## 2017-05-01 ENCOUNTER — Ambulatory Visit: Payer: Medicare Other | Admitting: Cardiology

## 2017-05-13 ENCOUNTER — Ambulatory Visit: Payer: Medicare Other | Admitting: Cardiology

## 2017-05-23 ENCOUNTER — Ambulatory Visit (INDEPENDENT_AMBULATORY_CARE_PROVIDER_SITE_OTHER): Payer: Medicare Other | Admitting: Physician Assistant

## 2017-05-23 ENCOUNTER — Encounter: Payer: Self-pay | Admitting: Physician Assistant

## 2017-05-23 VITALS — BP 120/86 | HR 64 | Ht 66.0 in | Wt 217.7 lb

## 2017-05-23 DIAGNOSIS — I10 Essential (primary) hypertension: Secondary | ICD-10-CM | POA: Diagnosis not present

## 2017-05-23 DIAGNOSIS — I480 Paroxysmal atrial fibrillation: Secondary | ICD-10-CM

## 2017-05-23 DIAGNOSIS — I77810 Thoracic aortic ectasia: Secondary | ICD-10-CM | POA: Diagnosis not present

## 2017-05-23 DIAGNOSIS — I5032 Chronic diastolic (congestive) heart failure: Secondary | ICD-10-CM

## 2017-05-23 MED ORDER — METOPROLOL SUCCINATE ER 25 MG PO TB24: 25.0000 mg | ORAL_TABLET | Freq: Every day | ORAL | 3 refills | Status: DC

## 2017-05-23 NOTE — Progress Notes (Signed)
Cardiology Office Note   Date:  05/23/2017   ID:  [name] [name], DOB [birth year], MRN [id number]  PCP:  Vicenta Aly, FNP  Cardiologist:  Dr. Percival Spanish, 11/04/2016  Kelly Ranieri, Suanne Marker, PA-C    History of Present Illness: [name] [name] is a 71 y.o. male with a history of PAF on Xarelto, mod AI & dilated Ao root 4.6 cm 12/2015, NSTEMI 2nd afib 12/2015 (med rx mod/small vessel CAD), DM, HLD, HTN, mild OSA (CPAP not needed), CHA2DS2 - VASc score of 4 (age x 1, HTN, CAD, DM)  11/04/2016 office visit, dilated aortic root to be followed with CTs, Lipitor increased to 80 mg daily, continue beta blocker and other meds  [name] [name] presents for cardiology follow up.   He feels that he is having a lot of atrial fib, several times per week. He once felt himself losing consciousness and his heart pounded once and woke him up. That has only happened once.   He has had other episodes of waking up, but admits he may have dozed off. He has sleep apnea, but his machine does not work. He is trying to arrange a study so he can get a new machine.   He has been taking care of his grandchildren and says he has to stop because it is hard for him. He is not getting his walking done for the day. He is trying to walk and [facility] He wishes to go there.   He feels he needs to get better meals.   He feels a little weak now, he thinks this is because he has not been getting enough sleep.   Every so often, he feels SOB and has to take several breaths, then he feels ok. No association w/ exertion.   He gets some LE edema during the day, wears compression stockings. Thinks he wakes in the night with OSA and nocturia, not PND. Denies orthopnea.   He is trying to avoid high-sugar foods. He has lost weight with dietary changes and exercise. He does not want to gain the weight back.    Past Medical History:  Diagnosis Date  . Anxiety   . Aortic root dilatation ([place])    a. 12/2015 Echo: mod dil  of sinus of valsalva and Ao root (41mm).  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 CAD (coronary artery disease)    a. 12/2015 NSTEMI/Cath (in setting of PAF):  LM nl, LAD 30p,  LCX 44m, RCA  ok, AM 100%, RPDA1 40, RPDA 2 60 ->Med Rx.  . Cervical pain 12/09/2014  . Childhood asthma   . Chronic diastolic CHF (congestive heart failure) ([place])    a. 12/2015 Echo: EF 50-55%, mod AI, mod Ao root dil, mild MR, mod dil LA, mod RA.  [name] COPD (chronic obstructive pulmonary disease) ([place])   . Diabetes mellitus type II   . History of syncope   . Hyperlipidemia   . Hypertensive heart disease   . Kidney lump 04/04/2010   Overview:  Renal Cell Carcinoma   . Moderate aortic insufficiency    a. 12/2015 Echo: Mod AI.  [name] Paroxysmal atrial fibrillation ([place])    a. 12/2015 started on Xarelto (CHA2DS2VASc = 4-5).  . Pneumonia   . Prostate cancer ([place])   . Renal cell carcinoma ([place])   . Renal lesion   . Sleep apnea    Does not  use CPAP  . Spinal stenosis in cervical region 10/12/2016    Past Surgical History:  Procedure Laterality Date  . BACK SURGERY    . CARDIAC CATHETERIZATION N/A 12/18/2015   Procedure: Left Heart Cath and Coronary Angiography;  Surgeon: Leonie Man, MD;  Location: [facility];  Service: Cardiovascular;  Laterality: N/A;  . LUMBAR [place]   "replaced a disc"    Current Outpatient Prescriptions  Medication Sig Dispense Refill  . albuterol (PROVENTIL HFA;VENTOLIN HFA) 108 (90 Base) MCG/ACT inhaler Inhale 2 puffs into the lungs every 6 (six) hours as needed for wheezing or shortness of breath. 1 Inhaler 3  . albuterol (PROVENTIL) (5 MG/ML) 0.5% nebulizer solution Take 0.5 mLs (2.5 mg total) by nebulization every 6 (six) hours as needed for wheezing or shortness of breath. 20 mL 12  . amLODipine (NORVASC) 10 MG tablet Take 10 mg by mouth 2 (two) times daily.     [name] atorvastatin (LIPITOR) 80  MG tablet Take 1 tablet (80 mg total) by mouth at bedtime. 90 tablet 3  . furosemide (LASIX) 20 MG tablet TAKE 1 TABLET (20 MG TOTAL) BY MOUTH DAILY. 30 tablet 6  . gabapentin (NEURONTIN) 600 MG tablet Take 1 tablet (600 mg total) by mouth 3 (three) times daily. 90 tablet 0  . KLOR-CON M20 20 MEQ tablet TAKE 1 TABLET TWICE A DAY 60 tablet 6  . montelukast (SINGULAIR) 10 MG tablet Take 10 mg by mouth at bedtime.     . rivaroxaban (XARELTO) 20 MG TABS tablet Take 1 tablet (20 mg total) by mouth daily with supper. 30 tablet 3   No current facility-administered medications for this visit.     Allergies:   Amoxicillin and Penicillins    Social History:  The patient  reports that he quit smoking about 39 years ago. His smoking use included Cigars. He has never used smokeless tobacco. He reports that he drinks alcohol. He reports that he does not use drugs.   Family History:  The patient's family history includes Asthma in his father; Emphysema in his father; Heart disease in his mother.   ROS:  Please see the history of present illness. All other systems are reviewed and negative.    PHYSICAL EXAM: VS:  BP 120/86   Pulse 64   Ht 5\' 6"  (1.676 m)   Wt 217 lb 11.2 oz (98.7 kg)   SpO2 97%   BMI 35.14 kg/m  , BMI Body mass index is 35.14 kg/m. GEN: Well nourished, well developed, male in no acute distress  HEENT: normal for age  Neck: minimal JVD, no carotid bruit, no masses Cardiac: RRR; 2/6 murmur, no rubs, or gallops Respiratory:  clear to auscultation bilaterally, normal work of breathing GI: soft, nontender, nondistended, + BS MS: no deformity or atrophy; trace-1+ LE edema; distal pulses are 2+ in all 4 extremities   Skin: warm and dry, no rash Neuro:  Strength and sensation are intact Psych: euthymic mood, full affect   EKG:  EKG is ordered today. ECG is sinus bradycardia, heart rate 54, inferolateral T-wave changes are old.  Recent Labs: 10/11/2016: TSH 1.908 10/12/2016:  Magnesium 1.9 10/13/2016: BUN 13; Creatinine, Ser 0.98; Hemoglobin 12.5; Platelets 260; Potassium 3.6; Sodium 136    Lipid Panel    Component Value Date/Time   CHOL 210 (H) 10/11/2016 1900   TRIG 136 10/11/2016 1900   HDL 63 10/11/2016 1900   CHOLHDL 3.3 10/11/2016 1900   VLDL 27  10/11/2016 1900   LDLCALC 120 (H) 10/11/2016 1900     Wt Readings from Last 3 Encounters:  05/23/17 217 lb 11.2 oz (98.7 kg)  02/18/17 227 lb (103 kg)  11/04/16 233 lb (105.7 kg)     Other studies Reviewed: Additional studies/ records that were reviewed today include: office notes, hospital records and testing.  ASSESSMENT AND PLAN:  1.  PAF: He is having palpitations several times a week, symptomatic at times. His BP/HR are well-controlled at baseline.  -- As they were getting ready to release him, he developed palpitations again. An EKG was obtained as quickly as possible, but he was in sinus bradycardia by the time it was obtained. His heart rate was 54. I'm uncomfortable putting him on anything that will lower his his heart rate beyond that, so I will not add a beta blocker at this time.  2. Mod CAD: Med rx, no ischemic sx, pt encouraged to increase his activity.   3. Dilated Aortic root: CTA to follow this was intended for about 05/07/2017>>schedule.    4. HTN:  It is important to keep his blood pressure under control. His volume status is good today. His blood pressures well controlled.  5. Chronic diastolic CHF: His volume status is good by exam today on Lasix 20 mg daily. Continue this with no med changes. He is on potassium 20 mEq twice a day and only on 20 mg of Lasix. Check a BMET today.   Current medicines are reviewed at length with the patient today.  The patient does not have concerns regarding medicines.  The following changes have been made:  no change  Labs/ tests ordered today include:   Orders Placed This Encounter  Procedures  . Basic metabolic panel  . EKG 12-Lead      Disposition:   FU with Dr. Percival Spanish  Signed, Rosaria Ferries, PA-C  05/23/2017 5:12 PM    [place] Phone: [phone]; Fax: [phone]  This note was written with the assistance of speech recognition software. Please excuse any transcriptional errors.

## 2017-05-23 NOTE — Patient Instructions (Addendum)
Medication Instructions:  NO CHANGES If you need a refill on your cardiac medications before your next appointment, please call your pharmacy.  Labwork: BMET TODAY HERE IN OUR OFFICE AT [facility]  Testing/Procedures: PLEASE SCHEDULE CT ANGIO  Follow-Up: Your physician wants you to follow-up in: 6 MONTHS WITH DR [place] SOONER IF CT IS ABNORMAL. You should receive a reminder letter in the mail two months in advance. If you do not receive a letter, please call our office January 2019 to schedule the MARCH 2019 follow-up appointment.   Special Instructions: DAILY WEIGHTS-CALL IF YOU ARE WAKING WITH FLUID IN YOUR LUNGS   Thank you for choosing [facility] at [facility]!!

## 2017-05-24 LAB — BASIC METABOLIC PANEL
BUN/Creatinine Ratio: 18 (ref 10–24)
BUN: 20 mg/dL (ref 8–27)
CALCIUM: 9 mg/dL (ref 8.6–10.2)
CHLORIDE: 99 mmol/L (ref 96–106)
CO2: 25 mmol/L (ref 20–29)
Creatinine, Ser: 1.1 mg/dL (ref 0.76–1.27)
GFR calc Af Amer: 77 mL/min/{1.73_m2} (ref >59)
GFR, EST NON AFRICAN AMERICAN: 66 mL/min/{1.73_m2} (ref >59)
Glucose: 87 mg/dL (ref 65–99)
POTASSIUM: 3.9 mmol/L (ref 3.5–5.2)
SODIUM: 138 mmol/L (ref 134–144)

## 2017-05-30 ENCOUNTER — Ambulatory Visit (INDEPENDENT_AMBULATORY_CARE_PROVIDER_SITE_OTHER)
Admission: RE | Admit: 2017-05-30 | Discharge: 2017-05-30 | Disposition: A | Discharge status: Home / Self Care | Payer: Medicare Other | Source: Ambulatory Visit | Attending: Cardiology | Admitting: Cardiology

## 2017-05-30 DIAGNOSIS — I77810 Thoracic aortic ectasia: Secondary | ICD-10-CM

## 2017-05-30 MED ORDER — IOPAMIDOL (ISOVUE-370) INJECTION 76%
100.0000 mL | Freq: Once | INTRAVENOUS | Status: AC | PRN
  Administered 2017-05-30: 100 mL via INTRAVENOUS

## 2017-06-02 ENCOUNTER — Other Ambulatory Visit: Payer: Self-pay | Admitting: Cardiology

## 2017-06-02 NOTE — Telephone Encounter (Signed)
REFILL 

## 2017-06-05 ENCOUNTER — Encounter: Payer: Self-pay | Admitting: *Deleted

## 2017-06-17 ENCOUNTER — Ambulatory Visit (INDEPENDENT_AMBULATORY_CARE_PROVIDER_SITE_OTHER): Payer: Medicare Other | Admitting: Podiatry

## 2017-06-17 ENCOUNTER — Encounter: Payer: Self-pay | Admitting: Podiatry

## 2017-06-17 DIAGNOSIS — B353 Tinea pedis: Secondary | ICD-10-CM | POA: Diagnosis not present

## 2017-06-17 DIAGNOSIS — L97301 Non-pressure chronic ulcer of unspecified ankle limited to breakdown of skin: Secondary | ICD-10-CM

## 2017-06-17 DIAGNOSIS — I872 Venous insufficiency (chronic) (peripheral): Secondary | ICD-10-CM

## 2017-06-17 MED ORDER — KETOCONAZOLE 2 % EX CREA: TOPICAL_CREAM | CUTANEOUS | 2 refills | Status: DC

## 2017-06-17 NOTE — Progress Notes (Signed)
T, between his toes resulting from his patient presents the office with chief complaint of itching and drainage coming from between multiple toes on both feet.  He says he has medicine at home, which has been used and he cannot pick up another prescription until October 23.  He says the medication has worked well but he is forgetful of its name.  He says he has provided no self treatment aside from this medication in an effort to relieve the itching and drainage  . This patient is concerned about a bacterial infection developing because of the fungal infection, between his toes.  He also has developed 2 skin lesions noted just above the anterior aspect of both ankles.  There is no pain associated with these 2 skin lesions.  He presents the office for evaluation of the athlete's foot condition and to acquire medication.  Finally, he presents with these 2 skin lesions above both ankles  Neurovascular status intact.  There is white, macerated skin noted between multiple digits on both feet.  There are areas where there is red, inflamed skin noted in addition to the maceration.  No evidence of any drainage noted.  There is no evidence of any bacterial infection presents on the dorsum of his feet.  There is a crust the skin lesion noted. Anterior both legs proximal to the ankle.  These are to venous stasis lesions in the absence of an infection.  Interdigital tinea pedis  B/L   Venous stasis lesions both legs.    ROV.  My initial examination revealed a tinea pedis that has developed interdigitally.  Patient states the medication that he has worked well but he needs a new prescription.  I told this patient to return home and to call Janett Billow with the name of the cream that has been helpful and I will then approve it and prescribed that for his athlete's foot.  Discussed putting Castellani paint between the toes that are macerated.  I then consulted with Dr. Jacqualyn Posey for an evaluation of his venous stasis lesions on  both feet.  He recommended that we apply Unna boots and prescribed vascular study on his venous system.  The Unna boots were then applied to his legs.  I told him the patient that he  needs to check with his medical doctor for a venous study test.  He says that he will call  the doctor and make that appointment. RTC 1 week for removal of unna boot.   Gardiner Barefoot DPM

## 2017-06-25 ENCOUNTER — Ambulatory Visit: Payer: Medicare Other | Admitting: Podiatry

## 2017-06-25 ENCOUNTER — Encounter: Payer: Self-pay | Admitting: *Deleted

## 2017-06-27 ENCOUNTER — Encounter: Payer: Self-pay | Admitting: Podiatry

## 2017-06-27 ENCOUNTER — Ambulatory Visit (INDEPENDENT_AMBULATORY_CARE_PROVIDER_SITE_OTHER): Payer: Medicare Other | Admitting: Podiatry

## 2017-06-27 VITALS — BP 165/96 | HR 64

## 2017-06-27 DIAGNOSIS — L97301 Non-pressure chronic ulcer of unspecified ankle limited to breakdown of skin: Secondary | ICD-10-CM | POA: Diagnosis not present

## 2017-06-27 DIAGNOSIS — B353 Tinea pedis: Secondary | ICD-10-CM | POA: Diagnosis not present

## 2017-06-27 DIAGNOSIS — I872 Venous insufficiency (chronic) (peripheral): Secondary | ICD-10-CM

## 2017-06-27 MED ORDER — TRIAMCINOLONE 0.1 % CREAM:EUCERIN CREAM 1:1: 1.0000 "application " | TOPICAL_CREAM | Freq: Two times a day (BID) | CUTANEOUS | 0 refills | Status: DC

## 2017-06-27 NOTE — Progress Notes (Signed)
This patient presents the office follow-up for a diagnosis of inner digital tinea pedis both feet as well as venous stasis lesions both legs.  He was seen in the office 1 week ago and it was decided to treat the venous stasis ulcers with Unna boots.  He says that the skin started itching so bad. He was forced to remove the [employer] so he can address the itching.  He also says that the medicine that was put between the toes has significantly improved and requests additional treatment of the toes.  He also says he still plans on seeing his medical doctor to be evaluated for his venous system.  He presents the office today stating that he still experiencing itching in the absence of any drainage from his legs.   General Appearance  Alert, conversant and in no acute stress.  Vascular  Dorsalis pedis and posterior pulses are palpable  bilaterally.  Capillary return is within normal limits  Bilaterally. Temperature is within normal limits  Bilaterally  Neurologic  Senn-Weinstein monofilament wire test within normal limits  bilaterally. Muscle power  Within normal limits bilaterally.  Nails Thick disfigured discolored nails with subungual debride bilaterally from hallux to fifth toes bilaterally. No evidence of bacterial infection or drainage bilaterally.  Orthopedic  No limitations of motion of motion feet bilaterally.  No crepitus or effusions noted.  No bony pathology or digital deformities noted.  Skin  White mascerated tissue between digits left foot.  Interdigital spaces are normal right foot.  The venous stasis lesions on his ankle/leg  B/L have dried.  No drainage or infection noted.   Tinea pedis left foot  Venous stasis  B/L  ROV  Castellani paint was again applied interdigitally on the left foot.  A prescription for triamterene [place] was called and to the pharmacy to help to limit the itching associated with his venous stasis lesions.  Patient to be seen by his medical doctor.   Gardiner Barefoot DPM

## 2017-07-10 ENCOUNTER — Other Ambulatory Visit: Payer: Self-pay | Admitting: Nurse Practitioner

## 2017-07-10 DIAGNOSIS — I872 Venous insufficiency (chronic) (peripheral): Secondary | ICD-10-CM

## 2017-07-15 ENCOUNTER — Ambulatory Visit
Admission: RE | Admit: 2017-07-15 | Discharge: 2017-07-15 | Disposition: A | Discharge status: Home / Self Care | Payer: Medicare Other | Source: Ambulatory Visit | Attending: Nurse Practitioner | Admitting: Nurse Practitioner

## 2017-07-15 DIAGNOSIS — I872 Venous insufficiency (chronic) (peripheral): Secondary | ICD-10-CM

## 2017-07-15 NOTE — Consult Note (Signed)
Chief Complaint:  Mild chronic venous insufficiency, slow healing ankle wounds  Referring Physician(s): [name],[name]  History of Present Illness: [name] [name] is a 71 y.o. male with multiple comorbidities including hypertension, COPD, hyperlipidemia, type 2 diabetes. He is closely followed by his primary care doctor and a podiatrist for chronic ankle and foot slow healing wounds and superficial fungus infection. He was originally seen back in 2014 by Dr. Kathlene Cote. At that time he had an ultrasound demonstrating minor bilateral GS V venous insufficiency. This was treated conservatively with compression stockings. He has been noncompliant with follow-up since then. Review of his venous history performed. No prior treatments for varicose veins including ablation, vein removal, or sclerotherapy. No history of recent DVT or phlebitis. The ankle and foot wounds are slowly improving with topical treatment by podiatry. Peripheral edema he reports has significantly improved. He is ambulating better. Overall he says he is improving.  Past Medical History:  Diagnosis Date  . Anxiety   . Aortic root dilatation ([place])    a. 12/2015 Echo: mod dil of sinus of valsalva and Ao root (22mm).  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 CAD (coronary artery disease)    a. 12/2015 NSTEMI/Cath (in setting of PAF):  LM nl, LAD 30p,  LCX 78m, RCA  ok, AM 100%, RPDA1 40, RPDA 2 60 ->Med Rx.  . Cervical pain 12/09/2014  . Childhood asthma   . Chronic diastolic CHF (congestive heart failure) ([place])    a. 12/2015 Echo: EF 50-55%, mod AI, mod Ao root dil, mild MR, mod dil LA, mod RA.  [name] COPD (chronic obstructive pulmonary disease) ([place])   . Diabetes mellitus type II   . History of syncope   . Hyperlipidemia   . Hypertensive heart disease   . Kidney lump 04/04/2010   Overview:  Renal Cell Carcinoma   . Moderate aortic insufficiency      a. 12/2015 Echo: Mod AI.  [name] Paroxysmal atrial fibrillation ([place])    a. 12/2015 started on Xarelto (CHA2DS2VASc = 4-5).  . Pneumonia   . Prostate cancer ([place])   . Renal cell carcinoma ([place])   . Renal lesion   . Sleep apnea    Does not use CPAP  . Spinal stenosis in cervical region 10/12/2016    Past Surgical History:  Procedure Laterality Date  . BACK SURGERY    . [place]   "replaced a disc"    Allergies: Amoxicillin and Penicillins  Medications: Prior to Admission medications   Medication Sig Start Date End Date Taking? Authorizing Provider  albuterol (PROVENTIL HFA;VENTOLIN HFA) 108 (90 Base) MCG/ACT inhaler Inhale 2 puffs into the lungs every 6 (six) hours as needed for wheezing or shortness of breath. 10/18/15  Yes Burns, Alexa R, MD  albuterol (PROVENTIL) (5 MG/ML) 0.5% nebulizer solution Take 0.5 mLs (2.5 mg total) by nebulization every 6 (six) hours as needed for wheezing or shortness of breath. 10/18/15  Yes Burns, Alexa R, MD  amLODipine (NORVASC) 10 MG tablet Take 10 mg by mouth 2 (two) times daily.  12/25/11  Yes [provider]  ketoconazole (NIZORAL) 2 % cream APPLY TO AFFECTED AREA EVERY DAY 06/17/17  Yes Gardiner Barefoot, DPM  KLOR-CON M20 20 MEQ tablet TAKE 1 TABLET TWICE A DAY 06/02/17  Yes Minus Breeding, MD  ONE TOUCH ULTRA TEST test strip CHECK BLOOD SUGAR 2-3  TIMES A DAY 06/12/17  Yes [provider]  Triamcinolone Acetonide (TRIAMCINOLONE 0.1 % CREAM : EUCERIN) CREA Apply 1 application topically 2 (two) times daily. 06/27/17  Yes Gardiner Barefoot, DPM  atorvastatin (LIPITOR) 80 MG tablet Take 1 tablet (80 mg total) by mouth at bedtime. Patient not taking: Reported on 07/15/2017 11/04/16   Minus Breeding, MD  furosemide (LASIX) 20 MG tablet TAKE 1 TABLET (20 MG TOTAL) BY MOUTH DAILY. Patient not taking: Reported on 07/15/2017 12/02/16   Minus Breeding, MD  gabapentin (NEURONTIN) 600 MG tablet Take 1 tablet (600 mg total) by mouth 3  (three) times daily. Patient not taking: Reported on 07/15/2017 10/13/16   Rogue Bussing, MD  montelukast (SINGULAIR) 10 MG tablet Take 10 mg by mouth at bedtime.     [provider]  rivaroxaban (XARELTO) 20 MG TABS tablet Take 1 tablet (20 mg total) by mouth daily with supper. Patient not taking: Reported on 07/15/2017 12/20/15   Mendel Corning, MD     Family History  Problem Relation Age of Onset  . Emphysema Father   . Asthma Father   . Heart disease Mother        No details.  Still alive age 65    Social History   Socioeconomic History  . Marital status: Widowed    Spouse name: Not on file  . Number of children: Not on file  . Years of education: Not on file  . Highest education level: Not on file  Social Needs  . Financial resource strain: Not on file  . Food insecurity - worry: Not on file  . Food insecurity - inability: Not on file  . Transportation needs - medical: Not on file  . Transportation needs - non-medical: Not on file  Occupational History  . Not on file  Tobacco Use  . Smoking status: Former Smoker    Types: Cigars    Last attempt to quit: 09/02/1977    Years since quitting: 39.8  . Smokeless tobacco: Never Used  . Tobacco comment:    Substance and Sexual Activity  . Alcohol use: Yes    Comment: "stopped drinking alcohol in ~ 1980; just drank a little on the weekends when I did drink"  . Drug use: No  . Sexual activity: No  Other Topics Concern  . Not on file  Social History Narrative   Lives alone.  Wife died in 04-01-23.  Lives in apartment.        Review of Systems: A 12 point ROS discussed and pertinent positives are indicated in the HPI above.  All other systems are negative.  Review of Systems  Vital Signs: BP 119/72   Pulse 63   Temp 97.9 F (36.6 C) (Oral)   Resp 16   Ht 5' 6.5" (1.689 m)   Wt 226 lb (102.5 kg)   SpO2 96%   BMI 35.93 kg/m   Physical Exam  Constitutional: He appears well-developed and  well-nourished. No distress.  Eyes: Conjunctivae are normal. No scleral icterus.  Musculoskeletal: Normal range of motion. He exhibits no tenderness or deformity.  Very minor peripheral ankle edema. Chronic scaly ankle and foot fungal wounds bilaterally without active ulceration. These are the patient's primary concern.  Skin: He is not diaphoretic.      Imaging: US Venous Img Lower Bilateral  Result Date: 07/15/2017 CLINICAL DATA:  Bilateral chronic venous stasis with healing ankle wounds EXAM: BILATERAL LOWER EXTREMITY VENOUS DOPPLER ULTRASOUND TECHNIQUE: Gray-scale sonography with graded  compression, as well as color Doppler and duplex ultrasound were performed to evaluate the lower extremity deep venous systems from the level of the common femoral vein and including the common femoral, femoral, profunda femoral, popliteal and calf veins including the posterior tibial, peroneal and gastrocnemius veins when visible. The superficial great saphenous vein was also interrogated. Spectral Doppler was utilized to evaluate flow at rest and with distal augmentation maneuvers in the common femoral, femoral and popliteal veins. COMPARISON:  02/02/2013 FINDINGS: RIGHT LOWER EXTREMITY Common Femoral Vein: No evidence of thrombus. Normal compressibility, respiratory phasicity and response to augmentation. Saphenofemoral Junction: No evidence of thrombus. Normal compressibility and flow on color Doppler imaging. Patent without significant reflux. Profunda Femoral Vein: No evidence of thrombus. Normal compressibility and flow on color Doppler imaging. Femoral Vein: No evidence of thrombus. Normal compressibility, respiratory phasicity and response to augmentation. Popliteal Vein: No evidence of thrombus. Normal compressibility, respiratory phasicity and response to augmentation. Superficial Great Saphenous Vein: Patent with minor reflux in the right GSV distal thigh and calf region. Small sub surface dilated  reticular veins. No significant varicosities. Small saphenous vein: Normal without reflux. Venous Reflux:  Minor as above Other Findings:  None. LEFT LOWER EXTREMITY Common Femoral Vein: No evidence of thrombus. Normal compressibility, respiratory phasicity and response to augmentation. Saphenofemoral Junction: No evidence of thrombus. Normal compressibility and flow on color Doppler imaging. No significant reflux or venous insufficiency Profunda Femoral Vein: No evidence of thrombus. Normal compressibility and flow on color Doppler imaging. Femoral Vein: No evidence of thrombus. Normal compressibility, respiratory phasicity and response to augmentation. Popliteal Vein: No evidence of thrombus. Normal compressibility, respiratory phasicity and response to augmentation. Superficial Great Saphenous Vein: Similar minor left GSV venous insufficiency without significant dilatation in the lower thigh and calf region. Similar minor dilated superficial reticular veins in the calf region without significant varicosities. Small saphenous vein:  Normal without reflux. Venous Reflux:  As above Other Findings:  None. IMPRESSION: Minor bilateral GSV venous insufficiency and dilated superficial reticular veins without significant varicosities. Negative for DVT. Electronically Signed   By: Jerilynn Mages.  Harolyn Cocker M.D.   On: 07/15/2017 10:14   [place] Rad Eval And Mgmt  Result Date: 07/15/2017 Please refer to "Notes" to see consult details.   Labs:  CBC: Recent Labs    09/03/16 0059 10/11/16 1354 10/12/16 0800 10/13/16 0630  WBC 7.9 6.6 8.8 9.2  HGB 12.4* 12.7* 12.1* 12.5*  HCT 36.4* 37.4* 36.7* 37.7*  PLT 299 255 266 260    COAGS: No results for input(s): INR, APTT in the last 8760 hours.  BMP: Recent Labs    10/11/16 1354 10/12/16 0800 10/13/16 0630 05/23/17 1518  NA 136 137 136 138  K 3.4* 3.1* 3.6 3.9  CL 104 103 105 99  CO2 27 26 24 25   GLUCOSE 124* 94 154* 87  BUN 12 12 13 20   CALCIUM 8.9 8.8* 8.8* 9.0    CREATININE 0.98 1.02 0.98 1.10  GFRNONAA >60 >60 >60 66  GFRAA >60 >60 >60 77    LIVER FUNCTION TESTS: No results for input(s): BILITOT, AST, ALT, ALKPHOS, PROT, ALBUMIN in the last 8760 hours.  Assessment and Plan:  Minor bilateral mid and distal GSV venous insufficiency which has not significantly changed by ultrasound compared 2014. The mild GSV venous insufficiency appears discordant with the degree of ankle and foot chronic scaly slow healing wounds. These appear to be secondary to chronic fungal infections which are being treated topically and followed by podiatry. He  reports a slow improvement with topical treatment.  I would continue conservative management plan with 15-20 mmHg knee-high compression stockings, continue topical therapy, and podiatry close follow-up. I do not think treating the minor GS V venous insufficiency would greatly improve his ankle and foot chronic issues since his ultrasound is stable compared 2014.  Outpatient follow-up in 6 months for reevaluation.  Thank you for this interesting consult.  I greatly enjoyed meeting Tyjuan A Odonoghue and look forward to participating in their care.  A copy of this report was sent to the requesting provider on this date.  Electronically Signed: Greggory Keen 07/15/2017, 10:24 AM   I spent a total of    40 Minutes in face to face in clinical consultation, greater than 50% of which was counseling/coordinating care for this patient with chronic foot and ankle scaly fungal infections and minor venous insufficiency

## 2017-07-17 ENCOUNTER — Other Ambulatory Visit: Payer: Self-pay | Admitting: Nurse Practitioner

## 2017-07-17 DIAGNOSIS — I739 Peripheral vascular disease, unspecified: Secondary | ICD-10-CM

## 2017-08-04 ENCOUNTER — Encounter (HOSPITAL_COMMUNITY): Payer: Self-pay | Admitting: *Deleted

## 2017-08-04 ENCOUNTER — Emergency Department (HOSPITAL_COMMUNITY)
Admission: EM | Admit: 2017-08-04 | Discharge: 2017-08-04 | Disposition: A | Discharge status: Home / Self Care | Payer: Medicare Other | Attending: Emergency Medicine | Admitting: Emergency Medicine

## 2017-08-04 ENCOUNTER — Other Ambulatory Visit: Payer: Self-pay

## 2017-08-04 DIAGNOSIS — Z5321 Procedure and treatment not carried out due to patient leaving prior to being seen by health care provider: Secondary | ICD-10-CM | POA: Diagnosis not present

## 2017-08-04 DIAGNOSIS — R51 Headache: Secondary | ICD-10-CM | POA: Diagnosis not present

## 2017-08-04 NOTE — ED Triage Notes (Signed)
Sinus congestion and drainage for 3-4 days and he cannot sleep

## 2017-08-04 NOTE — ED Notes (Signed)
Pt doesn't want to wait

## 2017-08-29 ENCOUNTER — Ambulatory Visit (INDEPENDENT_AMBULATORY_CARE_PROVIDER_SITE_OTHER): Payer: Medicare Other

## 2017-08-29 ENCOUNTER — Ambulatory Visit (HOSPITAL_COMMUNITY)
Admission: EM | Admit: 2017-08-29 | Discharge: 2017-08-29 | Disposition: A | Discharge status: Home / Self Care | Payer: Medicare Other | Attending: Internal Medicine | Admitting: Internal Medicine

## 2017-08-29 ENCOUNTER — Encounter (HOSPITAL_COMMUNITY): Payer: Self-pay | Admitting: Emergency Medicine

## 2017-08-29 DIAGNOSIS — J189 Pneumonia, unspecified organism: Secondary | ICD-10-CM | POA: Diagnosis not present

## 2017-08-29 DIAGNOSIS — R05 Cough: Secondary | ICD-10-CM | POA: Diagnosis not present

## 2017-08-29 MED ORDER — ACETAMINOPHEN 325 MG PO TABS
650.0000 mg | ORAL_TABLET | Freq: Once | ORAL | Status: AC
  Administered 2017-08-29: 650 mg via ORAL

## 2017-08-29 MED ORDER — LEVOFLOXACIN 750 MG PO TABS: 750.0000 mg | ORAL_TABLET | Freq: Every day | ORAL | 0 refills | Status: AC

## 2017-08-29 MED ORDER — ACETAMINOPHEN 325 MG PO TABS: 650.0000 mg | ORAL_TABLET | Freq: Four times a day (QID) | ORAL | 0 refills | Status: DC | PRN

## 2017-08-29 MED ORDER — ACETAMINOPHEN 325 MG PO TABS
ORAL_TABLET | ORAL | Status: AC
  Filled 2017-08-29: qty 2

## 2017-08-29 NOTE — Discharge Instructions (Signed)
Drink plenty of water to keep secretions thin. Complete course of antibiotics. Tylenol as needed for fevers

## 2017-08-29 NOTE — ED Notes (Signed)
Pt discharged by provider.

## 2017-08-29 NOTE — ED Provider Notes (Signed)
[facility]    CSN: [id number] Arrival date & time: 08/29/17  1611     History   Chief Complaint Chief Complaint  Patient presents with  . Fever  . Shortness of Breath    HPI [name] [name] is a 71 y.o. male.   [name] [name] presents with complaints of cough, nasal congestion, weakness, fevers which has been worsening over the past 10 days. States he has allergy symptoms for the past month but over the past 10 days developed worsen. Has had pneumonia in the past. Cough is productive, yellow phlegm. without chest pain, ear or throat pain. Has not taken any medications for his symptoms. Decreased appetite but without vomiting or diarrhea. Ambulatory, a&o x3. History of afib; htn, diabetes, enlarged prostate, chf, asthma. Normal creatinine from past two visits, most recent in 04/2017    ROS per HPI.       Past Medical History:  Diagnosis Date  . Anxiety   . Aortic root dilatation ([place])    a. 12/2015 Echo: mod dil of sinus of valsalva and Ao root (16m).  .Marland KitchenC5-C7 level with spinal cord injury with central cord syndrome, without evidence of spinal bone injury ([place] 10/12/2016  . CAD (coronary artery disease)    a. 12/2015 NSTEMI/Cath (in setting of PAF):  LM nl, LAD 30p,  LCX 330mRCA  ok, AM 100%, RPDA1 40, RPDA 2 60 ->Med Rx.  . Cervical pain 12/09/2014  . Childhood asthma   . Chronic diastolic CHF (congestive heart failure) ([place]   a. 12/2015 Echo: EF 50-55%, mod AI, mod Ao root dil, mild MR, mod dil LA, mod RA.  . Marland KitchenOPD (chronic obstructive pulmonary disease) ([place]CE[place]  . Diabetes mellitus type II   . History of syncope   . Hyperlipidemia   . Hypertensive heart disease   . Kidney lump 04/04/2010   Overview:  Renal Cell Carcinoma   . Moderate aortic insufficiency    a. 12/2015 Echo: Mod AI.  . Marland Kitchenaroxysmal atrial fibrillation (HC[place]   a. 12/2015 started on Xarelto (CHA2DS2VASc = 4-5).  . Pneumonia   . Prostate cancer (HCWalnut[place]  . Renal cell carcinoma (HCAlto[place]  . Renal lesion   .  Sleep apnea    Does not use CPAP  . Spinal stenosis in cervical region 10/12/2016    Patient Active Problem List   Diagnosis Date Noted  . 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 Head trauma, subsequent encounter 10/12/2016  . 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 Spinal stenosis in cervical region 10/12/2016  . Cervical spondylosis 10/12/2016  . Syncope and collapse 10/12/2016  . Fall   . Syncope 10/11/2016  . CAD (coronary artery disease)   . Chronic diastolic CHF (congestive heart failure) (HCLake Nord[place]  . Hyperlipidemia   . Hypertensive heart disease   . Paroxysmal atrial fibrillation (HCTell City04/16/2017  . Multiple myeloma (HCHarrison[place]04/16/2017  . Mild intermittent asthma 10/18/2015  . Aortic root dilatation (HCGarden View02/14/2017  . Diabetes (HCTampa02/14/2017  . 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 Hand paresthesia 12/09/2014  . Elevated prostate specific antigen (PSA) 07/12/2014  . Allergic rhinitis 07/12/2014  . LBP (low back pain) 07/12/2014  . Palpitation 07/11/2014  . Patient's other noncompliance with medication regimen 08/10/2013  . Chronic venous insufficiency 07/23/2012  . Obstructive apnea 04/11/2011  . Essential (primary) hypertension 09/07/2010  . ED (erectile  dysfunction) of organic origin 06/20/2010  . Anxiety, generalized 04/04/2010  . Cardiac conduction disorder 02/27/2010  . Benign prostatic hypertrophy without urinary obstruction 09/13/2009  . Abnormal findings on examination of genitourinary organs 08/09/2009  . Hereditary and idiopathic neuropathy 07/05/2009    Past Surgical History:  Procedure Laterality Date  . BACK SURGERY    . CARDIAC CATHETERIZATION N/A 12/18/2015   Procedure: Left Heart Cath and Coronary Angiography;  Surgeon: Leonie Man, MD;  Location: [facility];  Service: Cardiovascular;  Laterality: N/A;  . LUMBAR [place]   "replaced a disc"       Home Medications    Prior to Admission medications   Medication Sig Start Date End Date Taking? Authorizing Provider  amLODipine (NORVASC) 10 MG tablet Take 10 mg by mouth 2 (two) times daily.  12/25/11  Yes [provider]  KLOR-CON M20 20 MEQ tablet TAKE 1 TABLET TWICE A DAY 06/02/17  Yes Minus Breeding, MD  acetaminophen (TYLENOL) 325 MG tablet Take 2 tablets (650 mg total) by mouth every 6 (six) hours as needed for mild pain or fever. 08/29/17   Zigmund Gottron, NP  albuterol (PROVENTIL HFA;VENTOLIN HFA) 108 (90 Base) MCG/ACT inhaler Inhale 2 puffs into the lungs every 6 (six) hours as needed for wheezing or shortness of breath. 10/18/15   Burns, Arloa Koh, MD  albuterol (PROVENTIL) (5 MG/ML) 0.5% nebulizer solution Take 0.5 mLs (2.5 mg total) by nebulization every 6 (six) hours as needed for wheezing or shortness of breath. 10/18/15   Burns, Arloa Koh, MD  atorvastatin (LIPITOR) 80 MG tablet Take 1 tablet (80 mg total) by mouth at bedtime. Patient not taking: Reported on 07/15/2017 11/04/16   Minus Breeding, MD  furosemide (LASIX) 20 MG tablet TAKE 1 TABLET (20 MG TOTAL) BY MOUTH DAILY. Patient not taking: Reported on 07/15/2017 12/02/16   Minus Breeding, MD  gabapentin (NEURONTIN) 600 MG tablet Take 1 tablet (600 mg total) by mouth 3 (three) times daily. Patient not taking: Reported on 07/15/2017 10/13/16   Rogue Bussing, MD  ketoconazole (NIZORAL) 2 % cream APPLY TO AFFECTED AREA EVERY DAY 06/17/17   Gardiner Barefoot, DPM  levofloxacin (LEVAQUIN) 750 MG tablet Take 1 tablet (750 mg total) by mouth daily for 5 days. 08/29/17 09/03/17  Zigmund Gottron, NP  montelukast (SINGULAIR) 10 MG tablet Take 10 mg by mouth at bedtime.     [provider]  ONE TOUCH ULTRA TEST test strip CHECK BLOOD SUGAR 2-3 TIMES A DAY 06/12/17   [provider]  rivaroxaban (XARELTO) 20 MG TABS tablet Take 1 tablet (20 mg total) by mouth daily  with supper. Patient not taking: Reported on 07/15/2017 12/20/15   Rai, Vernelle Emerald, MD  Triamcinolone Acetonide (TRIAMCINOLONE 0.1 % CREAM : EUCERIN) CREA Apply 1 application topically 2 (two) times daily. 06/27/17   Gardiner Barefoot, DPM    Family History Family History  Problem Relation Age of Onset  . Emphysema Father   . Asthma Father   . Heart disease Mother        No details.  Still alive age 26    Social History Social History   Tobacco Use  . Smoking status: Former Smoker    Types: Cigars    Last attempt to quit: 09/02/1977    Years since quitting: 40.0  . Smokeless tobacco: Never Used  . Tobacco comment:    Substance Use Topics  . Alcohol use: Yes  Comment: "stopped drinking alcohol in ~ 1980; just drank a little on the weekends when I did drink"  . Drug use: No     Allergies   Amoxicillin and Penicillins   Review of Systems Review of Systems   Physical Exam Triage Vital Signs ED Triage Vitals  Enc Vitals Group     BP 08/29/17 1641 (!) 140/100     Pulse Rate 08/29/17 1641 84     Resp 08/29/17 1641 16     Temp 08/29/17 1641 (!) 102.6 F (39.2 C)     Temp Source 08/29/17 1641 Oral     SpO2 08/29/17 1641 98 %     Weight 08/29/17 1641 222 lb (100.7 kg)     Height 08/29/17 1641 _0  (1.676 m)     Head Circumference --      Peak Flow --      Pain Score 08/29/17 1642 0     Pain Loc --      Pain Edu? --      Excl. in [place]? --    No data found.  Updated Vital Signs BP (!) 140/100   Pulse 84   Temp (!) 102.6 F (39.2 C) (Oral)   Resp 16   Ht _1  (1.676 m)   Wt 222 lb (100.7 kg)   SpO2 98%   BMI 35.83 kg/m   Visual Acuity Right Eye Distance:   Left Eye Distance:   Bilateral Distance:    Right Eye Near:   Left Eye Near:    Bilateral Near:     Physical Exam  Constitutional: He is oriented to person, place, and time. He appears well-developed and well-nourished.  HENT:  Head: Normocephalic and atraumatic.  Right Ear: Tympanic membrane,  external ear and ear canal normal.  Left Ear: Tympanic membrane, external ear and ear canal normal.  Nose: Rhinorrhea present. Right sinus exhibits no maxillary sinus tenderness and no frontal sinus tenderness. Left sinus exhibits no maxillary sinus tenderness and no frontal sinus tenderness.  Mouth/Throat: Uvula is midline, oropharynx is clear and moist and mucous membranes are normal.  Eyes: Conjunctivae are normal. Pupils are equal, round, and reactive to light.  Neck: Normal range of motion.  Cardiovascular: Normal rate and regular rhythm.  Pulmonary/Chest: Effort normal and breath sounds normal.  Faint scattered wheezes noted at bilateral bases   Lymphadenopathy:    He has no cervical adenopathy.  Neurological: He is alert and oriented to person, place, and time.  Skin: Skin is warm and dry.  Vitals reviewed.    UC Treatments / Results  Labs (all labs ordered are listed, but only abnormal results are displayed) Labs Reviewed - No data to display  EKG  EKG Interpretation None       Radiology Dg Chest 2 View  Result Date: 08/29/2017 CLINICAL DATA:  Cough and congestion for 2 weeks EXAM: CHEST  2 VIEW COMPARISON:  05/30/2017 FINDINGS: Cardiac shadow is enlarged but stable. Aortic calcifications are seen. The lungs are hyperaerated consistent with COPD. No focal infiltrate or sizable effusion is noted. No acute bony abnormality is seen. IMPRESSION: COPD without acute abnormality. Electronically Signed   By: Inez Catalina M.D.   On: 08/29/2017 17:26    Procedures Procedures (including critical care time)  Medications Ordered in UC Medications  acetaminophen (TYLENOL) tablet 650 mg (650 mg Oral Given 08/29/17 1647)     Initial Impression / Assessment and Plan / UC Course  I have reviewed the triage vital signs  and the nursing notes.  Pertinent labs & imaging results that were available during my care of the patient were reviewed by me and considered in my medical  decision making (see chart for details).  Clinical Course as of Aug 29 1748  Fri Aug 29, 2017  1749 Temp 101.6   [NB]    Clinical Course User Index [NB] Augusto Gamble B, NP    Xray without infiltrate noted. Physical exam and history consistent with pneumonia however. Fever, without tachycardia or tachypnea. Patient is non toxic in appearance, ambulatory, alert and oriented. levaquin initiated. Recommended return to be rechecked in two days. Return precautions provided. Patient verbalized understanding and agreeable to plan.  Ambulatory out of clinic without difficulty.     Final Clinical Impressions(s) / UC Diagnoses   Final diagnoses:  Pneumonia due to infectious organism, unspecified laterality, unspecified part of lung    ED Discharge Orders        Ordered    levofloxacin (LEVAQUIN) 750 MG tablet  Daily     08/29/17 1743    acetaminophen (TYLENOL) 325 MG tablet  Every 6 hours PRN     08/29/17 1745       Controlled Substance Prescriptions [place] Controlled Substance Registry consulted? Not Applicable   Zigmund Gottron, NP 08/29/17 1750

## 2017-08-29 NOTE — ED Triage Notes (Signed)
PT reports drainage, cough, fever, sob, weakness for 1 month.

## 2017-09-27 ENCOUNTER — Encounter: Payer: Self-pay | Admitting: Cardiology

## 2017-09-27 NOTE — Progress Notes (Signed)
HPI The patient presents for evaluation of aortic root dilatation, aortic insufficiency and atrial fib.  Echo in Feb last year demonstrated severe concentric LVH.  There was moderate AI and the aorta root was 52 mm.   CT confirmed the root to be 52 mm and the ascending aorta to be 43.  He could not tolerate an MRI.   The EF was normal on echo.    Since I last saw him he has not been taking his medications as I would prescribe.  His Norvasc was doubled to 10 mg twice daily by his previous primary care doctor it looks like.  He is not taking his Lipitor for his Xarelto.  He said his finger bled too much when he would stick his finger and he said he changed his diet.  Therefore, he did not want his Lipitor.  He walks daily.  He denies any new cardiovascular symptoms.  He has had no chest pressure, neck or arm discomfort.  He denies any palpitations, presyncope or syncope.  He has no weight gain or edema and in fact has lost weight slowly over time.  He is going to get a new sleep study soon and thinks this will help with his blood pressure and feeling tired.  He has not had any syncope but he says he does fall asleep right after eating very quickly   Allergies  Allergen Reactions  . Amoxicillin Other (See Comments)    Can't move Has patient had a PCN reaction causing immediate rash, facial/tongue/throat swelling, SOB or lightheadedness with hypotension: No Has patient had a PCN reaction causing severe rash involving mucus membranes or skin necrosis: No Has patient had a PCN reaction that required hospitalization No Has patient had a PCN reaction occurring within the last 10 years: No If all of the above answers are "NO", then may proceed with Cephalosporin use.   [name] Penicillins Other (See Comments)    Can't move/dizziness Has patient had a PCN reaction causing immediate rash, facial/tongue/throat swelling, SOB or lightheadedness with hypotension: No Has patient had a PCN reaction causing severe  rash involving mucus membranes or skin necrosis: No Has patient had a PCN reaction that required hospitalization No Has patient had a PCN reaction occurring within the last 10 years: No If all of the above answers are "NO", then may proceed with Cephalosporin use.     Current Outpatient Medications  Medication Sig Dispense Refill  . amLODipine (NORVASC) 10 MG tablet Take 10 mg by mouth 2 (two) times daily.     [name] KLOR-CON M20 20 MEQ tablet TAKE 1 TABLET TWICE A DAY 60 tablet 6  . acetaminophen (TYLENOL) 325 MG tablet Take 2 tablets (650 mg total) by mouth every 6 (six) hours as needed for mild pain or fever. (Patient not taking: Reported on 09/29/2017) 60 tablet 0  . albuterol (PROVENTIL HFA;VENTOLIN HFA) 108 (90 Base) MCG/ACT inhaler Inhale 2 puffs into the lungs every 6 (six) hours as needed for wheezing or shortness of breath. (Patient not taking: Reported on 09/29/2017) 1 Inhaler 3  . albuterol (PROVENTIL) (5 MG/ML) 0.5% nebulizer solution Take 0.5 mLs (2.5 mg total) by nebulization every 6 (six) hours as needed for wheezing or shortness of breath. (Patient not taking: Reported on 09/29/2017) 20 mL 12  . atorvastatin (LIPITOR) 80 MG tablet Take 1 tablet (80 mg total) by mouth at bedtime. 90 tablet 11  . furosemide (LASIX) 20 MG tablet TAKE 1 TABLET (20 MG TOTAL) BY MOUTH DAILY. (  Patient not taking: Reported on 07/15/2017) 30 tablet 6  . gabapentin (NEURONTIN) 600 MG tablet Take 1 tablet (600 mg total) by mouth 3 (three) times daily. (Patient not taking: Reported on 07/15/2017) 90 tablet 0  . ketoconazole (NIZORAL) 2 % cream APPLY TO AFFECTED AREA EVERY DAY (Patient not taking: Reported on 09/29/2017) 15 g 2  . montelukast (SINGULAIR) 10 MG tablet Take 10 mg by mouth at bedtime.     . ONE TOUCH ULTRA TEST test strip CHECK BLOOD SUGAR 2-3 TIMES A DAY  3  . rivaroxaban (XARELTO) 20 MG TABS tablet Take 1 tablet (20 mg total) by mouth daily with supper. 30 tablet 11  . Triamcinolone Acetonide  (TRIAMCINOLONE 0.1 % CREAM : EUCERIN) CREA Apply 1 application topically 2 (two) times daily. (Patient not taking: Reported on 09/29/2017) 1 each 0   No current facility-administered medications for this visit.     Past Medical History:  Diagnosis Date  . Anxiety   . Aortic root dilatation ([place])    a. 12/2015 Echo: mod dil of sinus of valsalva and Ao root (19mm).  b. 53 mm Echo 2018  . 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 CAD (coronary artery disease)    a. 12/2015 NSTEMI/Cath (in setting of PAF):  LM nl, LAD 30p,  LCX 67m, RCA  ok, AM 100%, RPDA1 40, RPDA 2 60 ->Med Rx.  . Cervical pain 12/09/2014  . Childhood asthma   . Chronic diastolic CHF (congestive heart failure) ([place])    a. 12/2015 Echo: EF 50-55%, mod AI, mod Ao root dil, mild MR, mod dil LA, mod RA.  [name] COPD (chronic obstructive pulmonary disease) ([place])   . Diabetes mellitus type II   . History of syncope   . Hyperlipidemia   . Hypertensive heart disease   . Kidney lump 04/04/2010   Overview:  Renal Cell Carcinoma   . Moderate aortic insufficiency    a. 12/2015 Echo: Mod AI.  [name] Paroxysmal atrial fibrillation ([place])    a. 12/2015 started on Xarelto (CHA2DS2VASc = 4-5).  . Pneumonia   . Prostate cancer ([place])   . Renal cell carcinoma ([place])   . Renal lesion   . Sleep apnea    Does not use CPAP  . Spinal stenosis in cervical region 10/12/2016    Past Surgical History:  Procedure Laterality Date  . BACK SURGERY    . CARDIAC CATHETERIZATION N/A 12/18/2015   Procedure: Left Heart Cath and Coronary Angiography;  Surgeon: Leonie Man, MD;  Location: [facility];  Service: Cardiovascular;  Laterality: N/A;  . LUMBAR [place]   "replaced a disc"     ROS:  As stated in the HPI and negative for all other systems.  PHYSICAL EXAM BP 110/68   Pulse 64   Ht 5\' 6"  (1.676 m)   Wt 226 lb 6.4 oz (102.7 kg)   SpO2 96%   BMI 36.54  kg/m   GENERAL:  Well appearing NECK:  No jugular venous distention, waveform within normal limits, carotid upstroke brisk and symmetric, no bruits, no thyromegaly LUNGS:  Clear to auscultation bilaterally CHEST:  Unremarkable HEART:  PMI not displaced or sustained,S1 and S2 within normal limits, no S3, no S4, no clicks, no rubs, 2 out of 6 apical systolic murmur radiating slightly at the aortic outflow tract, no diastolic murmurs ABD:  Flat, positive bowel sounds normal in frequency  in pitch, no bruits, no rebound, no guarding, no midline pulsatile mass, no hepatomegaly, no splenomegaly EXT:  2 plus pulses throughout, no edema, no cyanosis no clubbing   GENERAL:  Well appearing HEENT:  Pupils equal round and reactive, fundi not visualized, oral mucosa unremarkable, proptosis, poor dentition NECK:  No jugular venous distention, waveform within normal limits, carotid upstroke brisk and symmetric, right bruit, no thyromegaly LUNGS:  Clear CHEST:  Unremarkable HEART:  PMI not displaced or sustained,S1 and S2 within normal limits, no S3, no S4, no clicks, no rubs, diastolic murmur at the left 3rd intercostal space, short systolic murmur ABD:  Flat, positive bowel sounds normal in frequency in pitch, no bruits, no rebound, no guarding, no midline pulsatile mass, no hepatomegaly, no splenomegaly EXT:  2 plus pulses throughout, mild leg edema, no cyanosis, sores on the lower legs healed   Lab Results  Component Value Date   CHOL 210 (H) 10/11/2016   TRIG 136 10/11/2016   HDL 63 10/11/2016   LDLCALC 120 (H) 10/11/2016    ASSESSMENT AND PLAN  Syncope: He had no further syncope.  No further evaluation is warranted.  I think he does fall asleep quickly.  Chronic diastolic congestive heart failure:   He seems to be euvolemic.  I do not think he is taking his Lasix but I think he can stay off of this.  I will get a basic metabolic profile.  AI:    I will follow this clinically.  It was moderate  last year on echo.  Paroxysmal atrial fibrillation:     Mr. Jasdeep Kepner Offer has a CHA2DS2 - VASc score of 3. with a risk of stroke of 3.2%.  I try to convince him to take his anticoagulants and he says he will start back.   Hypertensive heart disease:   His blood pressure is controlled.  His Norvasc should only be once daily.  I will not be prescribing that medicine and if I do in the future it would be once daily.   Dilated aortic root: He tried to have his MRI but couldn't stay in the machine.  He is going to get a follow-up aorta angiogram.  Coronary artery disease: He has not been having any chest pain. Catheterization in 2017 revealed total occlusion of the acute marginal with otherwise nonobstructive disease.  He has had no new symptoms and we continue with risk reduction.  Hyperlipidemia: LDL was 120 last year.  He stopped taking his Lipitor.  I am going to repeat a lipid profile and resume this medication.  Sleep Apnea:   I agree with repeating this study per Nolene Ebbs, MD

## 2017-09-29 ENCOUNTER — Ambulatory Visit (INDEPENDENT_AMBULATORY_CARE_PROVIDER_SITE_OTHER): Payer: Medicare Other | Admitting: Cardiology

## 2017-09-29 ENCOUNTER — Encounter: Payer: Self-pay | Admitting: Cardiology

## 2017-09-29 VITALS — BP 110/68 | HR 64 | Ht 66.0 in | Wt 226.4 lb

## 2017-09-29 DIAGNOSIS — I2510 Atherosclerotic heart disease of native coronary artery without angina pectoris: Secondary | ICD-10-CM

## 2017-09-29 DIAGNOSIS — I712 Thoracic aortic aneurysm, without rupture, unspecified: Secondary | ICD-10-CM | POA: Insufficient documentation

## 2017-09-29 DIAGNOSIS — I480 Paroxysmal atrial fibrillation: Secondary | ICD-10-CM | POA: Diagnosis not present

## 2017-09-29 DIAGNOSIS — I77810 Thoracic aortic ectasia: Secondary | ICD-10-CM

## 2017-09-29 DIAGNOSIS — I110 Hypertensive heart disease with heart failure: Secondary | ICD-10-CM

## 2017-09-29 DIAGNOSIS — I2583 Coronary atherosclerosis due to lipid rich plaque: Secondary | ICD-10-CM | POA: Diagnosis not present

## 2017-09-29 DIAGNOSIS — E782 Mixed hyperlipidemia: Secondary | ICD-10-CM | POA: Diagnosis not present

## 2017-09-29 DIAGNOSIS — R55 Syncope and collapse: Secondary | ICD-10-CM

## 2017-09-29 LAB — BASIC METABOLIC PANEL
BUN / CREAT RATIO: 15 (ref 10–24)
BUN: 18 mg/dL (ref 8–27)
CHLORIDE: 96 mmol/L (ref 96–106)
CO2: 25 mmol/L (ref 20–29)
Calcium: 9.3 mg/dL (ref 8.6–10.2)
Creatinine, Ser: 1.2 mg/dL (ref 0.76–1.27)
GFR calc non Af Amer: 60 mL/min/{1.73_m2} (ref >59)
GFR, EST AFRICAN AMERICAN: 69 mL/min/{1.73_m2} (ref >59)
Glucose: 82 mg/dL (ref 65–99)
Potassium: 3.6 mmol/L (ref 3.5–5.2)
Sodium: 139 mmol/L (ref 134–144)

## 2017-09-29 MED ORDER — RIVAROXABAN 20 MG PO TABS: 20.0000 mg | ORAL_TABLET | Freq: Every day | ORAL | 11 refills | Status: DC

## 2017-09-29 MED ORDER — ATORVASTATIN CALCIUM 80 MG PO TABS: 80.0000 mg | ORAL_TABLET | Freq: Every day | ORAL | 11 refills | Status: DC

## 2017-09-29 NOTE — Patient Instructions (Signed)
Medication Instructions:  RESTART- Xarelto 20 mg daily and Atorvastatin 80 mg daily Take-Amlodipine 10 mg daily  If you need a refill on your cardiac medications before your next appointment, please call your pharmacy.  Labwork: BMP Today HERE IN OUR OFFICE AT [facility]  Take the provided lab slips for you to take with you to the lab for you blood draw.   Lipid Profile-You will need to fast. DO NOT EAT OR DRINK PAST MIDNIGHT.   You may go to any [facility] lab that is convenient for you however, we do have a lab in our office that is able to assist you. You do NOT need an appointment for our lab. Once in our office lobby there is a podium to the right of the check-in desk where you are to sign-in and ring a doorbell to alert [place] you are here. Lab is open Monday-Friday from 8:00am to 4:00pm; and is closed for lunch from 12:45p-1:45pm   Testing/Procedures: Non-Cardiac CT scanning, (CAT scanning), is a noninvasive, special x-ray that produces cross-sectional images of the body using x-rays and a computer. CT scans help physicians diagnose and treat medical conditions. For some CT exams, a contrast material is used to enhance visibility in the area of the body being studied. CT scans provide greater clarity and reveal more details than regular x-ray exams.  Follow-Up: Your physician wants you to follow-up in: 6 Months with an APP.    Thank you for choosing [facility] at [facility]!!

## 2017-09-30 LAB — LIPID PANEL
CHOL/HDL RATIO: 3.6 ratio (ref 0.0–5.0)
Cholesterol, Total: 199 mg/dL (ref 100–199)
HDL: 56 mg/dL (ref >39)
LDL Calculated: 121 mg/dL — ABNORMAL HIGH (ref 0–99)
Triglycerides: 109 mg/dL (ref 0–149)
VLDL CHOLESTEROL CAL: 22 mg/dL (ref 5–40)

## 2017-10-07 ENCOUNTER — Inpatient Hospital Stay: Admission: RE | Admit: 2017-10-07 | Payer: Medicare Other | Source: Ambulatory Visit

## 2017-10-21 ENCOUNTER — Inpatient Hospital Stay: Admission: RE | Admit: 2017-10-21 | Payer: Medicare Other | Source: Ambulatory Visit

## 2018-01-05 ENCOUNTER — Other Ambulatory Visit (HOSPITAL_BASED_OUTPATIENT_CLINIC_OR_DEPARTMENT_OTHER): Payer: Self-pay

## 2018-01-05 DIAGNOSIS — G4730 Sleep apnea, unspecified: Secondary | ICD-10-CM

## 2018-01-07 ENCOUNTER — Encounter: Payer: Self-pay | Admitting: Adult Health

## 2018-01-07 ENCOUNTER — Ambulatory Visit (INDEPENDENT_AMBULATORY_CARE_PROVIDER_SITE_OTHER): Payer: Medicare Other | Admitting: Adult Health

## 2018-01-07 VITALS — BP 126/76 | HR 53 | Ht 66.0 in | Wt 226.0 lb

## 2018-01-07 DIAGNOSIS — Z5181 Encounter for therapeutic drug level monitoring: Secondary | ICD-10-CM | POA: Diagnosis not present

## 2018-01-07 DIAGNOSIS — I480 Paroxysmal atrial fibrillation: Secondary | ICD-10-CM | POA: Diagnosis not present

## 2018-01-07 DIAGNOSIS — I77810 Thoracic aortic ectasia: Secondary | ICD-10-CM | POA: Diagnosis not present

## 2018-01-07 DIAGNOSIS — I10 Essential (primary) hypertension: Secondary | ICD-10-CM | POA: Diagnosis not present

## 2018-01-07 NOTE — Patient Instructions (Addendum)
Medication Instructions:  RECOMMEND YOU ONLY TAKE YOUR AMLODIPINE ONCE A DAY   Labwork: BMET/CBC TODAY   Testing/Procedures: NONE  Follow-Up: Your physician recommends that you schedule a follow-up appointment in: [place]   If you need a refill on your cardiac medications before your next appointment, please call your pharmacy.

## 2018-01-07 NOTE — Progress Notes (Signed)
Cardiology Office Note   Date:  01/07/2018   ID:  KEIANDRE [name], DOB [birth year], MRN [id number]  PCP:  Nolene Ebbs, MD  Cardiologist: Dr. Percival Spanish  Chief Complaint  Patient presents with  . Follow-up  . Hypertension  . Atrial Fibrillation     History of Present Illness: [name] [name] is a 72 y.o. male who presents for ongoing assessment and management of aortic root dilatation, aortic insufficiency, and atrial fibrillation.  Most recent CT scan confirmed aortic root at 52 mm and ascending aorta to be 43 mm.  The patient did not tolerate an MRI.  Most recent echocardiogram in February 2018 revealed severe concentric LVH with moderate AI.  The patient was last seen in the office by Dr. Percival Spanish on 09/29/2017.  On that office visit the patient was not compliant with current medication regimen that was prescribed by Dr. Percival Spanish.  Medications were changed by PCP to include increased dose of amlodipine to 10 mg daily, the patient was not taking Lipitor, or taking Xarelto.  The patient was also set up for a sleep study in the setting of daytime fatigue and difficult to control high blood pressure.  On last office visit with Dr. Percival Spanish the patient was euvolemic, he was counseled on need to take Xarelto with a CHADS Vasc score of 3 and a stroke risk of 3.2%.  At that time the patient agreed to restart Xarelto.  He was advised to take his amlodipine once a day..Sleep study was scheduled for 01/05/2018.  The patient comes today, very talkative, expressing concerns that his blood pressure will go up if he does not take his medications as he chooses to.  The patient has stopped taking Xarelto again as he felt his blood was too thin.  His sleep study was rescheduled for Jan 25 2018 as a split night study.  He is also concerned about his electrolytes making sure that his potassium level is normal.  He states that he often has periods of dizziness especially after he eats and he thinks is related to low  potassium.  Past Medical History:  Diagnosis Date  . Anxiety   . Aortic root dilatation ([place])    a. 12/2015 Echo: mod dil of sinus of valsalva and Ao root (78mm).  b. 53 mm Echo 2018  . 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 CAD (coronary artery disease)    a. 12/2015 NSTEMI/Cath (in setting of PAF):  LM nl, LAD 30p,  LCX 3m, RCA  ok, AM 100%, RPDA1 40, RPDA 2 60 ->Med Rx.  . Cervical pain 12/09/2014  . Childhood asthma   . Chronic diastolic CHF (congestive heart failure) ([place])    a. 12/2015 Echo: EF 50-55%, mod AI, mod Ao root dil, mild MR, mod dil LA, mod RA.  [name] COPD (chronic obstructive pulmonary disease) ([place])   . Diabetes mellitus type II   . History of syncope   . Hyperlipidemia   . Hypertensive heart disease   . Kidney lump 04/04/2010   Overview:  Renal Cell Carcinoma   . Moderate aortic insufficiency    a. 12/2015 Echo: Mod AI.  [name] Paroxysmal atrial fibrillation ([place])    a. 12/2015 started on Xarelto (CHA2DS2VASc = 4-5).  . Pneumonia   . Prostate cancer ([place])   . Renal cell carcinoma ([place])   . Renal lesion   . Sleep apnea    Does not use CPAP  .  Spinal stenosis in cervical region 10/12/2016    Past Surgical History:  Procedure Laterality Date  . BACK SURGERY    . CARDIAC CATHETERIZATION N/A 12/18/2015   Procedure: Left Heart Cath and Coronary Angiography;  Surgeon: Leonie Man, MD;  Location: [facility];  Service: Cardiovascular;  Laterality: N/A;  . LUMBAR [place]   "replaced a disc"     Current Outpatient Medications  Medication Sig Dispense Refill  . acetaminophen (TYLENOL) 325 MG tablet Take 2 tablets (650 mg total) by mouth every 6 (six) hours as needed for mild pain or fever. 60 tablet 0  . albuterol (PROVENTIL HFA;VENTOLIN HFA) 108 (90 Base) MCG/ACT inhaler Inhale 2 puffs into the lungs every 6 (six) hours as needed for wheezing or shortness of breath. 1 Inhaler  3  . albuterol (PROVENTIL) (5 MG/ML) 0.5% nebulizer solution Take 0.5 mLs (2.5 mg total) by nebulization every 6 (six) hours as needed for wheezing or shortness of breath. 20 mL 12  . amLODipine (NORVASC) 10 MG tablet Take 10 mg by mouth 2 (two) times daily.     [name] aspirin EC 81 MG tablet Take 81 mg by mouth daily.    [name] KLOR-CON M20 20 MEQ tablet TAKE 1 TABLET TWICE A DAY 60 tablet 6  . montelukast (SINGULAIR) 10 MG tablet Take 10 mg by mouth at bedtime.     . ONE TOUCH ULTRA TEST test strip CHECK BLOOD SUGAR 2-3 TIMES A DAY  3   No current facility-administered medications for this visit.     Allergies:   Amoxicillin and Penicillins    Social History:  The patient  reports that he quit smoking about 40 years ago. His smoking use included cigars. He has never used smokeless tobacco. He reports that he drinks alcohol. He reports that he does not use drugs.   Family History:  The patient's family history includes Asthma in his father; Emphysema in his father; Heart disease in his mother.    ROS: All other systems are reviewed and negative. Unless otherwise mentioned in H&P    PHYSICAL EXAM: VS:  BP 126/76   Pulse (!) 53   Ht 5\' 6"  (1.676 m)   Wt 226 lb (102.5 kg)   BMI 36.48 kg/m  , BMI Body mass index is 36.48 kg/m. GEN: Well nourished, well developed, in no acute distress  HEENT: normal  Neck: no JVD, carotid bruits, or masses Cardiac: Patient taking recommendations RRR; no murmurs, rubs, or gallops,non-pitting edema  Respiratory:  clear to auscultation bilaterally, normal work of breathing GI: soft, nontender, nondistended, + BS MS: no deformity or atrophy  Skin: warm and dry, no rash Neuro:  Strength and sensation are intact Psych: euthymic mood, full affect   EKG: Normal sinus rhythm, sinus bradycardia heart rate 53 bpm, LVH is noted.  Nonspecific T-wave abnormalities inferiorly.  Recent Labs: 09/29/2017: BUN 18; Creatinine, Ser 1.20; Potassium 3.6; Sodium 139     Lipid Panel    Component Value Date/Time   CHOL 199 09/30/2017 1042   TRIG 109 09/30/2017 1042   HDL 56 09/30/2017 1042   CHOLHDL 3.6 09/30/2017 1042   CHOLHDL 3.3 10/11/2016 1900   VLDL 27 10/11/2016 1900   LDLCALC 121 (H) 09/30/2017 1042      Wt Readings from Last 3 Encounters:  01/07/18 226 lb (102.5 kg)  09/29/17 226 lb 6.4 oz (102.7 kg)  08/29/17 222 lb (100.7 kg)      Other studies Reviewed:  Cardiac Cath 12/19/2015   Acute Mrg lesion, 100% stenosed.  RPDA-2 lesion, 60% stenosed.  RPDA-1 lesion, 40% stenosed.  Mid Cx lesion, 30% stenosed.  Prox LAD lesion, 30% stenosed.   1. Nonobstructive disease in the LCA   Echocardiogram 10/12/2016 Left ventricle: The cavity size was normal. There was severe   concentric hypertrophy. Systolic function was normal. The   estimated ejection fraction was in the range of 60% to 65%. Wall   motion was normal; there were no regional wall motion   abnormalities. Doppler parameters are consistent with abnormal   left ventricular relaxation (grade 1 diastolic dysfunction). - Aortic valve: There was moderate regurgitation. - Aorta: The aorta was moderately dilated. Aortic root dimension:   52 mm (ED). - Left atrium: The atrium was moderately dilated. - Right atrium: The atrium was mildly dilated.  Impressions:  Dilated aortic root. Consider CT scan of ascending aorta to   further quantify aorta.  ASSESSMENT AND PLAN:  1.  Hypertension: Blood pressures currently well controlled but he is having periods of lightheadedness and dizziness especially after eating in the middle of the day.  He has been advised again to only take amlodipine 10 mg daily but he adamantly refuses to do this.    He is going to continue to take it twice daily despite our advisement against this.  He states his blood pressure gets very high at night and he is worried it is because of his sleep apnea.  He does not wish to decrease the dose to  recommended 10 mg daily.  I have advised him that this is not what we have prescribed for him.  He verbalizes understanding. He states that he will talk to his PCP about this.  2.  Paroxysmal atrial fibrillation: The patient refuses daily Xarelto.  He states that it is making his blood too thin.  I explained to him the risk factors for CVA and need to have protection.  The patient will think about starting it back but it is unlikely that he will do so based upon our conversation today.  He is welcome to take an aspirin a day.  He is aware of the risk for CVA.  We will check CBC and BMET  3.  Obstructive sleep apnea: Patient sleeps that he has been rescheduled for Jan 23, 2018.  The patient states that he will go to that appointment as he is very concerned about his sleep apnea.  We will consider decreasing his medication to amlodipine daily once sleep study has been completed and he has been placed on CPAP.  4.  Thoracic aortic aneurysm: He has refused repeat MRI.  5  COPD: No complaints of dyspnea on exertion.  He states he walks daily about 15 to 20 minutes around the parking lot of his apartment building.  Current medicines are reviewed at length with the patient today.    Labs/ tests ordered today include: BMET, CBC  Phill Myron. [place] Pugh, ANP, AACC   01/07/2018 1:51 PM    [facility] [address] [place], [place] [address] Phone: [phone]; Fax: [phone]

## 2018-01-08 LAB — CBC WITH DIFFERENTIAL/PLATELET
BASOS ABS: 0 10*3/uL (ref 0.0–0.2)
BASOS: 0 %
EOS (ABSOLUTE): 0.1 10*3/uL (ref 0.0–0.4)
Eos: 2 %
Hematocrit: 31.3 % — ABNORMAL LOW (ref 37.5–51.0)
Hemoglobin: 10.6 g/dL — ABNORMAL LOW (ref 13.0–17.7)
IMMATURE GRANS (ABS): 0 10*3/uL (ref 0.0–0.1)
Immature Granulocytes: 0 %
Lymphocytes Absolute: 1.6 10*3/uL (ref 0.7–3.1)
Lymphs: 29 %
MCH: 31 pg (ref 26.6–33.0)
MCHC: 33.9 g/dL (ref 31.5–35.7)
MCV: 92 fL (ref 79–97)
Monocytes Absolute: 0.4 10*3/uL (ref 0.1–0.9)
Monocytes: 8 %
NEUTROS ABS: 3.2 10*3/uL (ref 1.4–7.0)
Neutrophils: 61 %
PLATELETS: 293 10*3/uL (ref 150–379)
RBC: 3.42 x10E6/uL — AB (ref 4.14–5.80)
RDW: 14.1 % (ref 12.3–15.4)
WBC: 5.3 10*3/uL (ref 3.4–10.8)

## 2018-01-08 LAB — BASIC METABOLIC PANEL
BUN/Creatinine Ratio: 19 (ref 10–24)
BUN: 29 mg/dL — ABNORMAL HIGH (ref 8–27)
CHLORIDE: 100 mmol/L (ref 96–106)
CO2: 27 mmol/L (ref 20–29)
Calcium: 9.1 mg/dL (ref 8.6–10.2)
Creatinine, Ser: 1.56 mg/dL — ABNORMAL HIGH (ref 0.76–1.27)
GFR calc Af Amer: 50 mL/min/{1.73_m2} — ABNORMAL LOW (ref >59)
GFR, EST NON AFRICAN AMERICAN: 43 mL/min/{1.73_m2} — AB (ref >59)
GLUCOSE: 82 mg/dL (ref 65–99)
POTASSIUM: 4 mmol/L (ref 3.5–5.2)
SODIUM: 139 mmol/L (ref 134–144)

## 2018-01-13 ENCOUNTER — Ambulatory Visit: Payer: Medicare Other | Admitting: Gastroenterology

## 2018-01-19 ENCOUNTER — Other Ambulatory Visit (HOSPITAL_COMMUNITY): Payer: Self-pay | Admitting: Interventional Radiology

## 2018-01-19 DIAGNOSIS — I872 Venous insufficiency (chronic) (peripheral): Secondary | ICD-10-CM

## 2018-01-25 ENCOUNTER — Encounter (HOSPITAL_BASED_OUTPATIENT_CLINIC_OR_DEPARTMENT_OTHER): Payer: Medicare Other

## 2018-01-31 ENCOUNTER — Ambulatory Visit (HOSPITAL_BASED_OUTPATIENT_CLINIC_OR_DEPARTMENT_OTHER): Payer: Medicare Other | Attending: Internal Medicine | Admitting: Internal Medicine

## 2018-01-31 VITALS — Ht 67.0 in | Wt 272.0 lb

## 2018-01-31 DIAGNOSIS — E119 Type 2 diabetes mellitus without complications: Secondary | ICD-10-CM | POA: Diagnosis not present

## 2018-01-31 DIAGNOSIS — G4733 Obstructive sleep apnea (adult) (pediatric): Secondary | ICD-10-CM | POA: Diagnosis not present

## 2018-01-31 DIAGNOSIS — I10 Essential (primary) hypertension: Secondary | ICD-10-CM | POA: Insufficient documentation

## 2018-01-31 DIAGNOSIS — R351 Nocturia: Secondary | ICD-10-CM | POA: Diagnosis not present

## 2018-01-31 DIAGNOSIS — R0683 Snoring: Secondary | ICD-10-CM | POA: Diagnosis not present

## 2018-01-31 DIAGNOSIS — I493 Ventricular premature depolarization: Secondary | ICD-10-CM | POA: Insufficient documentation

## 2018-01-31 DIAGNOSIS — G4730 Sleep apnea, unspecified: Secondary | ICD-10-CM

## 2018-02-07 DIAGNOSIS — G4733 Obstructive sleep apnea (adult) (pediatric): Secondary | ICD-10-CM

## 2018-02-07 NOTE — Procedures (Signed)
  Patient Name: [name] [name], [name] [name] Date: 01/31/2018 Gender: Male D.O.B: September 06, 1945 Age (years): 81 Referring Provider: Nolene Ebbs Height (inches): 68 Interpreting Physician: Baird Lyons MD, ABSM Weight (lbs): 237 RPSGT: Lanae Boast BMI: 38 MRN: [id number] Neck Size: 17.00  CLINICAL INFORMATION Sleep Study Type: NPSG Indication for sleep study: Diabetes, Hypertension, OSA  Epworth Sleepiness Score: 15  Most recent polysomnogram dated 07/14/2016 revealed an AHI of 9.2/h and RDI of 9.7/h.  SLEEP STUDY TECHNIQUE As per the AASM Manual for the Scoring of Sleep and Associated Events v2.3 (April 2016) with a hypopnea requiring 4% desaturations.  The channels recorded and monitored were frontal, central and occipital EEG, electrooculogram (EOG), submentalis EMG (chin), nasal and oral airflow, thoracic and abdominal wall motion, anterior tibialis EMG, snore microphone, electrocardiogram, and pulse oximetry.  MEDICATIONS Medications self-administered by patient taken the night of the study : none reported  SLEEP ARCHITECTURE The study was initiated at 10:48:32 PM and ended at 5:13:35 AM.  Sleep onset time was 10.7 minutes and the sleep efficiency was 74.2%%. The total sleep time was 285.8 minutes.  Stage REM latency was 15.0 minutes.  The patient spent 24.7%% of the night in stage N1 sleep, 55.0%% in stage N2 sleep, 0.0%% in stage N3 and 20.29% in REM.  Alpha intrusion was absent.  Supine sleep was 84.67%.  RESPIRATORY PARAMETERS The overall apnea/hypopnea index (AHI) was 14.1 per hour. There were 12 total apneas, including 9 obstructive, 3 central and 0 mixed apneas. There were 55 hypopneas and 25 RERAs.  The AHI during Stage REM sleep was 50.7 per hour.  AHI while supine was 11.2 per hour.  The mean oxygen saturation was 92.2%. The minimum SpO2 during sleep was 80.0%.  soft snoring was noted during this study.  CARDIAC DATA The 2 lead EKG demonstrated sinus  rhythm. The mean heart rate was 52.4 beats per minute. Other EKG findings include: PVCs.  LEG MOVEMENT DATA The total PLMS were 0 with a resulting PLMS index of 0.0. Associated arousal with leg movement index was 4.8 .  IMPRESSIONS - Mild obstructive sleep apnea occurred during this study (AHI = 14.1/h). - Insufficient early events to meet protocol requirements for split CPAP titration. - No significant central sleep apnea occurred during this study (CAI = 0.6/h). - Moderate oxygen desaturation was noted during this study (Min O2 = 80.0%, Mean 92.2%). - The patient snored with soft snoring volume. - EKG findings include PVCs. - Clinically significant periodic limb movements did not occur during sleep. No significant associated arousals. - Fragmented sleep with frequent arousals and awakenings, including nocturia x 5, - Sleep talking noted.  DIAGNOSIS - Obstructive Sleep Apnea (327.23 [G47.33 ICD-10])  RECOMMENDATIONS - Suggest CPAP titration sleep study or DME autopap. - Suggest attention to sleep disruption related to nocturia, if appropriate. - Sleep hygiene should be reviewed to assess factors that may improve sleep quality. - Weight management and regular exercise should be initiated or continued if appropriate.  [Electronically signed] 02/07/2018 03:07 PM  Baird Lyons MD, [place], American Board of Sleep Medicine   NPI: [id number]                           [place], [place] [facility]  ELECTRONICALLY SIGNED ON:  02/07/2018, 2:59 PM [place] PH: [phone]   FX: [phone] [place]

## 2018-03-11 ENCOUNTER — Other Ambulatory Visit: Payer: Self-pay | Admitting: Gastroenterology

## 2018-03-11 DIAGNOSIS — K6389 Other specified diseases of intestine: Secondary | ICD-10-CM

## 2018-03-12 IMAGING — CT CT HEAD W/O CM
4 of 8 series · 16 of 47 positions shown, 18 images · non-contrast
Comparison: CT scan 07/03/2014

CLINICAL DATA: Syncopal episode.  Fell.

EXAM:
CT HEAD WITHOUT CONTRAST
CT CERVICAL SPINE WITHOUT CONTRAST
TECHNIQUE: Multidetector CT imaging of the head and cervical spine was
performed following the standard protocol without intravenous
contrast. Multiplanar CT image reconstructions of the cervical spine
were also generated.

[Series 4: head 2.0 h70h · axial · 0.47mm/px · z∈[-68,+22]mm · 5 of 80 slices shown]
[im 12/80  brain]
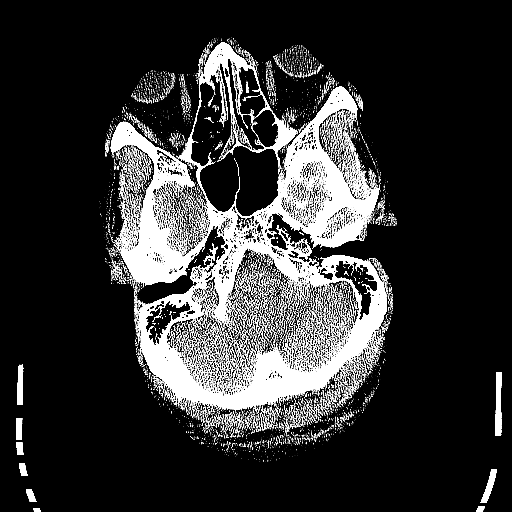
[im 23/80  brain]
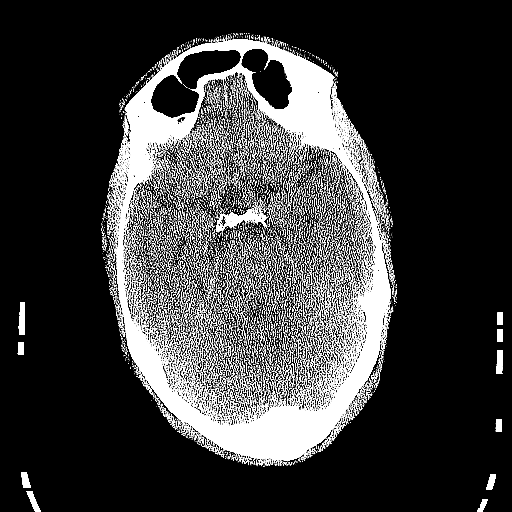
[im 34/80  brain]
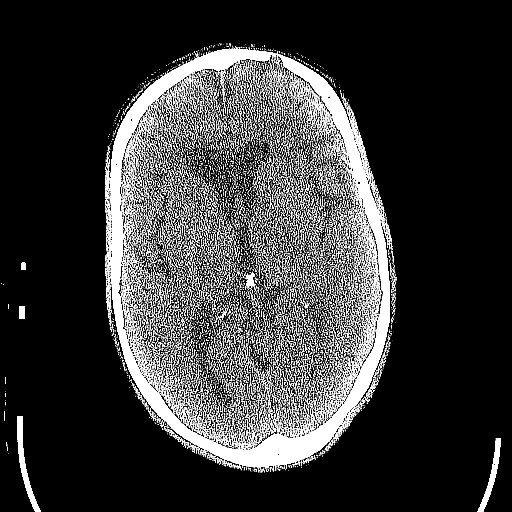
[im 46/80  brain]
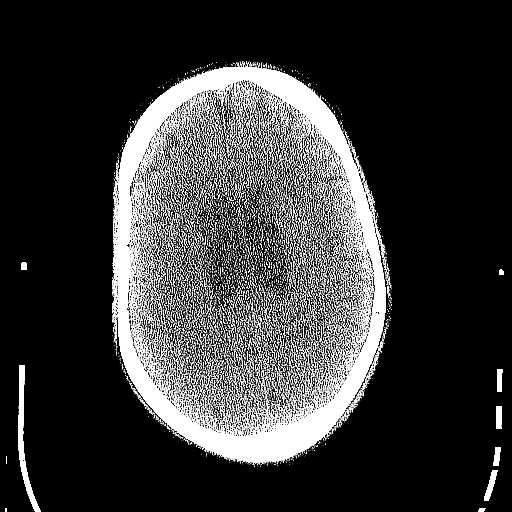
[im 57/80  brain]
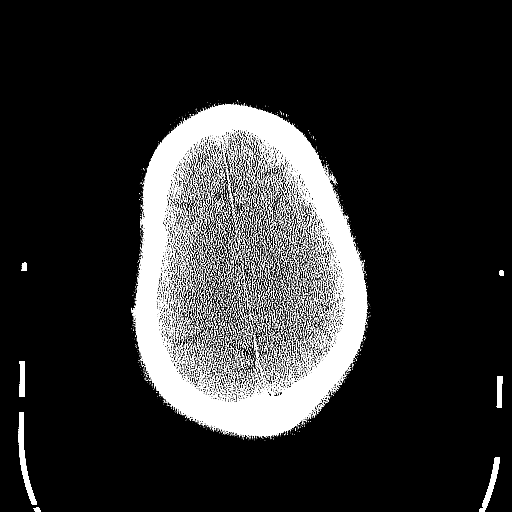

[Series 5: head 3.0 mpr cor · coronal · 0.31mm/px · 3 of 73 slices shown]
[im 15/73  brain]
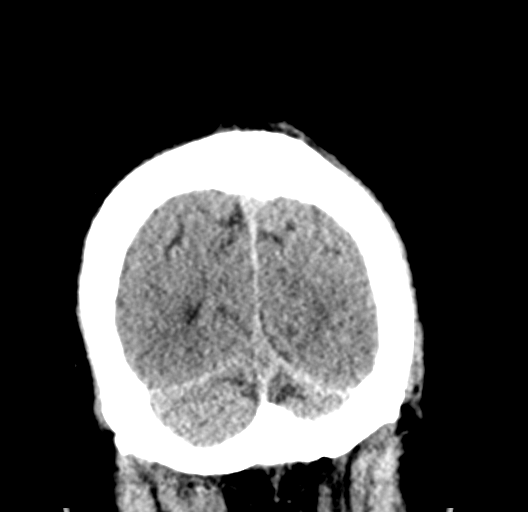
[im 29/73  brain]
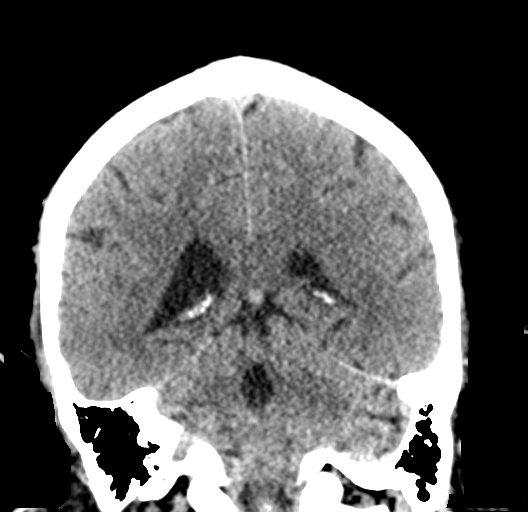
[im 44/73  brain]
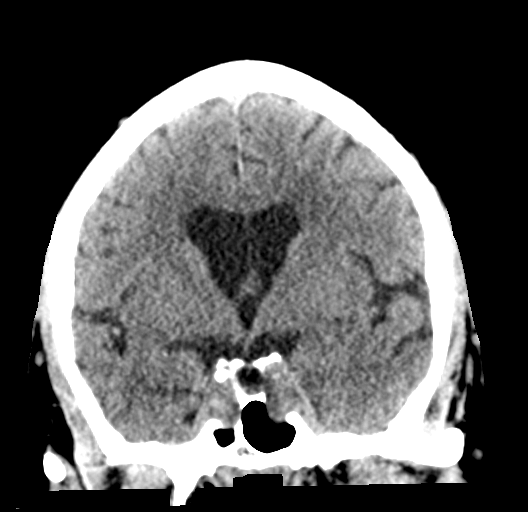

[Series 6: head 3.0 mpr sag · sagittal · 0.31mm/px · 1 of 56 slices shown]
[im 28/56  brain]
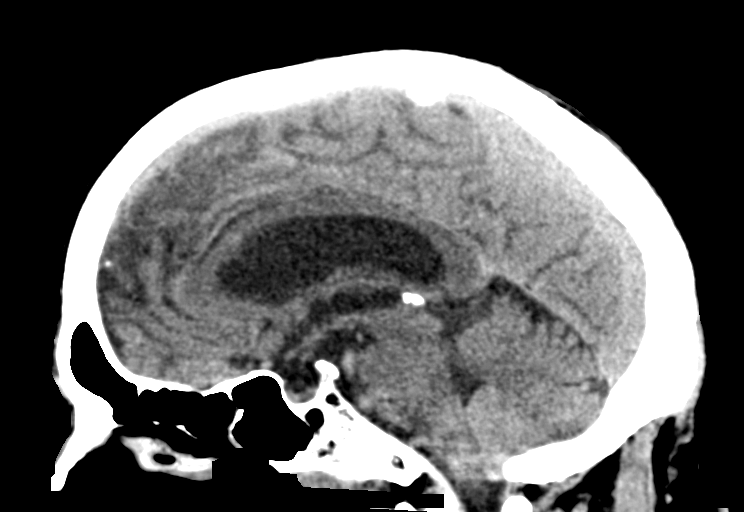

[Series 15: orthogonal axial st · axial · 0.21mm/px · z∈[-259,-126]mm · 7 of 93 slices shown, 9 images]
[im 12/93  brain]
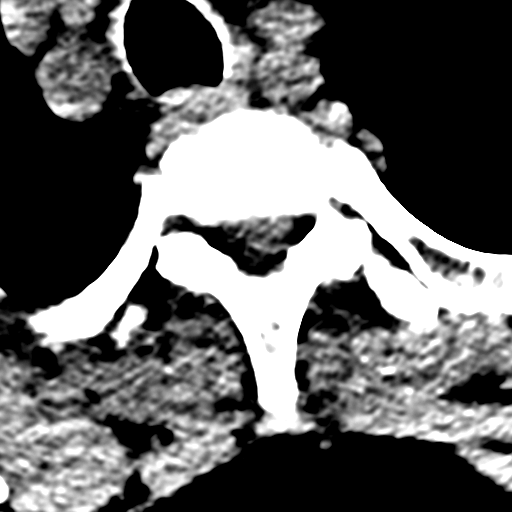
[im 12/93  bone]
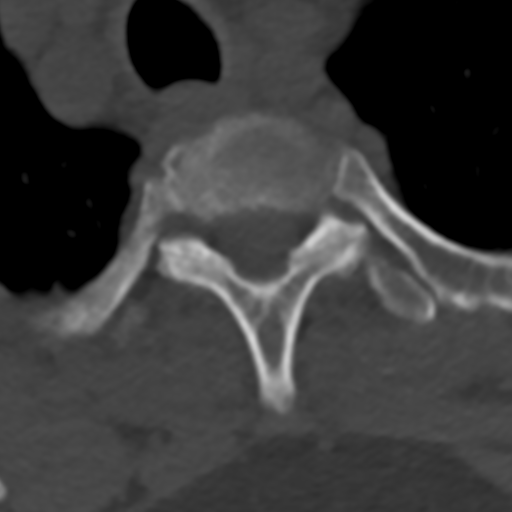
[im 24/93  brain]
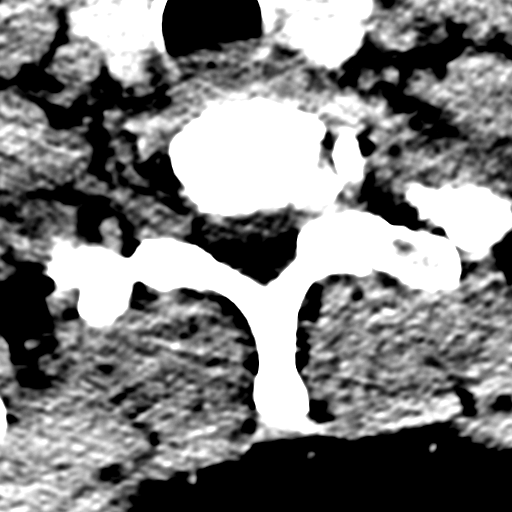
[im 35/93  brain]
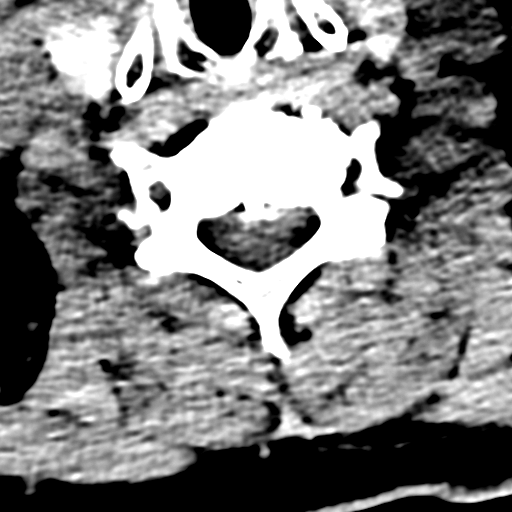
[im 47/93  brain]
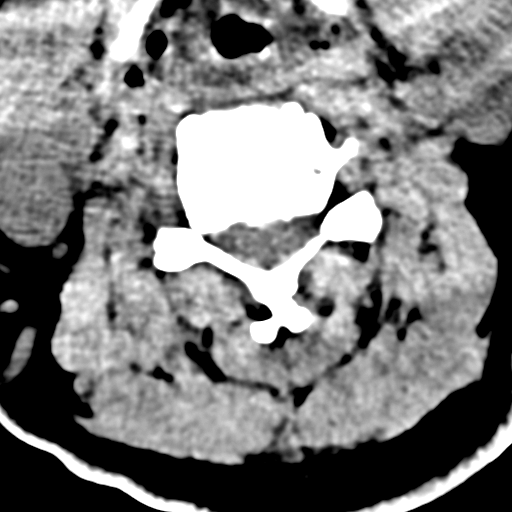
[im 58/93  brain]
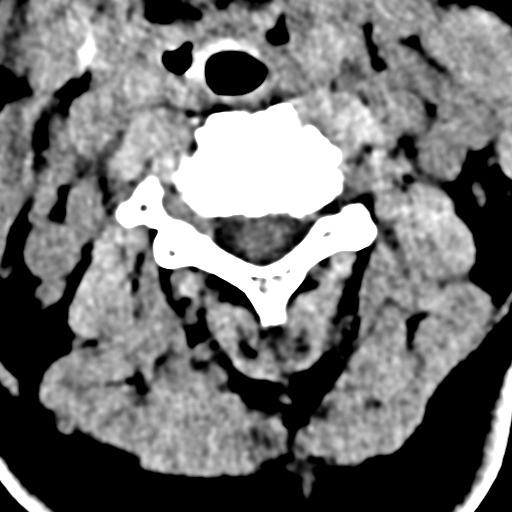
[im 58/93  bone]
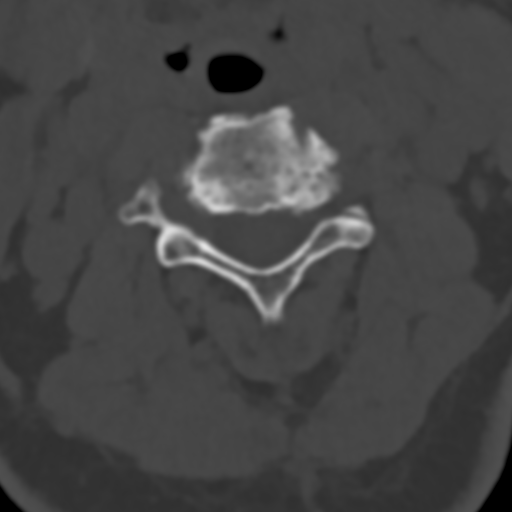
[im 70/93  brain]
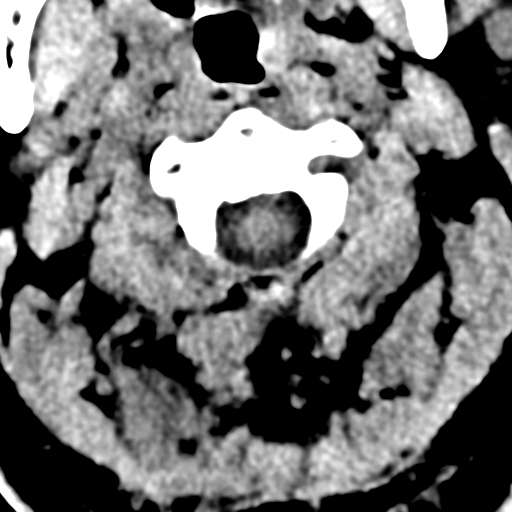
[im 81/93  brain]
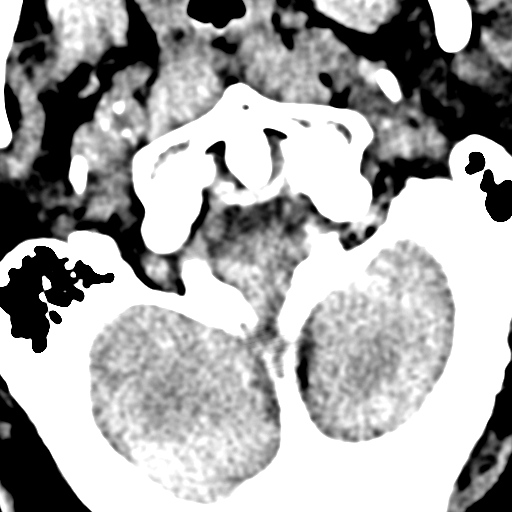

[16 of 47 positions shown; findings below may reference images not displayed]

FINDINGS: CT HEAD FINDINGS

Brain: Stable age related cerebral atrophy, ventriculomegaly and
periventricular white matter disease. No extra-axial fluid
collections are identified. No CT findings for acute hemispheric
infarction or intracranial hemorrhage. No mass lesions. The
brainstem and cerebellum are normal.

Vascular: Stable scattered vascular calcifications. No aneurysm or
hyperdense vessels.

Skull: No skull fracture or bone lesion.

Sinuses/Orbits: Moderate fluid noted in the left maxillary sinus
which may suggest acute sinusitis. No sinus wall fractures. The
paranasal sinuses and mastoid air cells are otherwise clear.

Other: No scalp lesion or hematoma.  The globes are intact.

CT CERVICAL SPINE FINDINGS

Alignment: Grossly normal. Straightening of the normal cervical
lordosis. Advanced degenerative cervical spondylosis with multilevel
disc disease and facet disease.

Skull base and vertebrae: No acute fracture.

Soft tissues and spinal canal: No abnormal prevertebral soft tissue
swelling.

Disc levels: Multilevel disc disease and facet disease but no
significant spinal stenosis. Mild to moderate multilevel foraminal
stenosis.

Upper chest: The visualized lung apices are grossly clear.

Other: None
IMPRESSION: 1. No acute intracranial findings or skull fracture.
2. Stable age related cerebral atrophy, ventriculomegaly and
periventricular white matter disease.
3. Left maxillary sinus disease.
4. Advanced degenerative cervical spondylosis with multilevel disc
disease and facet disease but no acute cervical spine fracture.

## 2018-03-17 ENCOUNTER — Encounter: Payer: Self-pay | Admitting: Internal Medicine

## 2018-03-18 ENCOUNTER — Other Ambulatory Visit (HOSPITAL_BASED_OUTPATIENT_CLINIC_OR_DEPARTMENT_OTHER): Payer: Self-pay

## 2018-03-18 DIAGNOSIS — G4730 Sleep apnea, unspecified: Secondary | ICD-10-CM

## 2018-03-20 ENCOUNTER — Ambulatory Visit (HOSPITAL_BASED_OUTPATIENT_CLINIC_OR_DEPARTMENT_OTHER): Payer: Medicare Other | Attending: Internal Medicine | Admitting: Internal Medicine

## 2018-03-20 VITALS — Ht 67.0 in | Wt 272.0 lb

## 2018-03-20 DIAGNOSIS — G4733 Obstructive sleep apnea (adult) (pediatric): Secondary | ICD-10-CM

## 2018-03-20 DIAGNOSIS — G4730 Sleep apnea, unspecified: Secondary | ICD-10-CM

## 2018-03-23 ENCOUNTER — Other Ambulatory Visit: Payer: Medicare Other

## 2018-03-24 ENCOUNTER — Ambulatory Visit
Admission: RE | Admit: 2018-03-24 | Discharge: 2018-03-24 | Disposition: A | Discharge status: Home / Self Care | Payer: Medicare Other | Source: Ambulatory Visit | Attending: Gastroenterology | Admitting: Gastroenterology

## 2018-03-24 DIAGNOSIS — K6389 Other specified diseases of intestine: Secondary | ICD-10-CM

## 2018-03-24 MED ORDER — IOPAMIDOL (ISOVUE-300) INJECTION 61%
100.0000 mL | Freq: Once | INTRAVENOUS | Status: AC | PRN
  Administered 2018-03-24: 100 mL via INTRAVENOUS

## 2018-04-01 DIAGNOSIS — G4733 Obstructive sleep apnea (adult) (pediatric): Secondary | ICD-10-CM

## 2018-04-01 NOTE — Procedures (Signed)
  Patient Name: [name] [name], Ulbricht Date: 03/20/2018 Gender: Male D.O.B: November 13, 1945 Age (years): 60 Referring Provider: Nolene Ebbs Height (inches): 67 Interpreting Physician: Baird Lyons MD, ABSM Weight (lbs): 272 RPSGT: Baxter Flattery BMI: 43 MRN: [id number] Neck Size: 16.50  CLINICAL INFORMATION The patient is referred for a CPAP titration to treat sleep apnea.  Date of NPSG, Split Night or HST:  SLEEP STUDY TECHNIQUE As per the AASM Manual for the Scoring of Sleep and Associated Events v2.3 (April 2016) with a hypopnea requiring 4% desaturations.  The channels recorded and monitored were frontal, central and occipital EEG, electrooculogram (EOG), submentalis EMG (chin), nasal and oral airflow, thoracic and abdominal wall motion, anterior tibialis EMG, snore microphone, electrocardiogram, and pulse oximetry. Continuous positive airway pressure (CPAP) was initiated at the beginning of the study and titrated to treat sleep-disordered breathing.  MEDICATIONS Medications self-administered by patient taken the night of the study : none reported  TECHNICIAN COMMENTS Comments added by technician: Patient had difficulty initiating sleep. Comments added by scorer: N/A  RESPIRATORY PARAMETERS Optimal PAP Pressure (cm): 9 AHI at Optimal Pressure (/hr): 0.0 Overall Minimal O2 (%): 86.0 Supine % at Optimal Pressure (%): 48 Minimal O2 at Optimal Pressure (%): 91.0   SLEEP ARCHITECTURE The study was initiated at 10:28:12 PM and ended at 4:26:01 AM.  Sleep onset time was 23.4 minutes and the sleep efficiency was 37.6%%. The total sleep time was 134.5 minutes.  The patient spent 17.5%% of the night in stage N1 sleep, 82.5%% in stage N2 sleep, 0.0%% in stage N3 and 0% in REM.Stage REM latency was N/A minutes  Wake after sleep onset was 199.9. Alpha intrusion was absent. Supine sleep was 69.52%.  CARDIAC DATA The 2 lead EKG demonstrated sinus rhythm. The mean heart rate was 50.2 beats  per minute. Other EKG findings include: PVCs.  LEG MOVEMENT DATA The total Periodic Limb Movements of Sleep (PLMS) were 0. The PLMS index was 0.0. A PLMS index of <15 is considered normal in adults.  IMPRESSIONS - The optimal PAP pressure was 9 cm of water. - Central sleep apnea was not noted during this titration (CAI = 0.4/h). - Moderate oxygen desaturations were observed during this titration (min O2 = 86.0%). Min O2 sat at CPAP 9 was 91%. - No snoring was audible during this study. - 2-lead EKG demonstrated: PVCs - Clinically significant periodic limb movements were not noted during this study. Arousals associated with PLMs were rare.  DIAGNOSIS - Obstructive Sleep Apnea (327.23 [G47.33 ICD-10])  RECOMMENDATIONS - Trial of CPAP therapy on 9 cm H2O or DME autopap 5-15. Patienbt used a IT trainer Under Nose Frame (L) mask and heated humidification. - Be careful with alcohol, sedatives and other CNS depressants that may worsen sleep apnea and disrupt normal sleep architecture. - Sleep hygiene should be reviewed to assess factors that may improve sleep quality. - Weight management and regular exercise should be initiated or continued.  [Electronically signed] 04/01/2018 02:08 PM  Baird Lyons MD, [place], American Board of Sleep Medicine   NPI: [id number]                           [place], [place] [facility]  ELECTRONICALLY SIGNED ON:  04/01/2018, 1:55 PM [place] PH: [phone]   FX: [phone] [place]

## 2018-04-07 ENCOUNTER — Telehealth: Payer: Self-pay | Admitting: Cardiology

## 2018-04-07 ENCOUNTER — Telehealth: Payer: Self-pay

## 2018-04-07 NOTE — Telephone Encounter (Signed)
   [facility] Pre-operative Risk Assessment    Request for surgical clearance:  1. What type of surgery is being performed? Colon surgery   2. When is this surgery scheduled? TBD   3. What type of clearance is required (medical clearance vs. Pharmacy clearance to hold med vs. Both)? Both  4. Are there any medications that need to be held prior to surgery and how long? ASA   5. Practice name and name of physician performing surgery? [facility]   6. What is your office phone number [phone]    7.   What is your office fax number [phone]  8.   Anesthesia type (None, local, MAC, general) ? General Anesthesia   [name] [name] 04/07/2018, 2:35 PM  _________________________________________________________________   (provider comments below)

## 2018-04-07 NOTE — Telephone Encounter (Signed)
Received records from [facility] on 04/07/18, Appt 04/14/18 @ 1:20PM. NV

## 2018-04-08 ENCOUNTER — Other Ambulatory Visit: Payer: Self-pay | Admitting: Cardiology

## 2018-04-08 NOTE — Telephone Encounter (Signed)
Rx sent to pharmacy   

## 2018-04-09 ENCOUNTER — Telehealth: Payer: Self-pay | Admitting: Cardiology

## 2018-04-09 NOTE — Telephone Encounter (Signed)
New Message ° ° ° ° ° ° ° ° ° °Patient returned your call °

## 2018-04-09 NOTE — Telephone Encounter (Signed)
Returned call-unsure who attempted to contact patient.  Aware of appt 8/13 at 1:20 pm with Dr. Percival Spanish

## 2018-04-12 ENCOUNTER — Encounter: Payer: Self-pay | Admitting: Cardiology

## 2018-04-12 NOTE — Progress Notes (Signed)
HPI The patient presents for evaluation of aortic root dilatation, aortic insufficiency and atrial fib.  Echo in Feb last year demonstrated severe concentric LVH.  There was moderate AI and the aorta root was 52 mm.   CT confirmed the root to be 52 mm and the ascending aorta to be 43.  He could not tolerate an MRI.   The EF was normal on echo. CTA demonstrated a 4.3 cm aortic root which we are following.   At the last visit with our Sturgeon he was not taking his Xarelto because he thought his blood was too thin.   He has refused to take 10 mg of Amlodipine which was prescribed.   Since I last saw him he is done very well.  He exercises.  He says that he gets his walking and he feels like a teenager.  He does not sleep well but he is about to be treated for sleep apnea.  He says this makes him tired during the day.  He might be getting married!  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  He was referred for preoperative clearance. The patient denies any new symptoms such as chest discomfort, neck or arm discomfort. There has been no new shortness of breath, PND or orthopnea. There have been no reported palpitations, presyncope or syncope.    Allergies  Allergen Reactions  . Amoxicillin Other (See Comments)    Can't move Has patient had a PCN reaction causing immediate rash, facial/tongue/throat swelling, SOB or lightheadedness with hypotension: No Has patient had a PCN reaction causing severe rash involving mucus membranes or skin necrosis: No Has patient had a PCN reaction that required hospitalization No Has patient had a PCN reaction occurring within the last 10 years: No If all of the above answers are "NO", then may proceed with Cephalosporin use.   [name] Penicillins Other (See Comments)    Can't move/dizziness Has patient had a PCN reaction causing immediate rash, facial/tongue/throat swelling, SOB or lightheadedness with hypotension: No Has  patient had a PCN reaction causing severe rash involving mucus membranes or skin necrosis: No Has patient had a PCN reaction that required hospitalization No Has patient had a PCN reaction occurring within the last 10 years: No If all of the above answers are "NO", then may proceed with Cephalosporin use.  Other reaction(s): Other Can't move Can't move     Current Outpatient Medications  Medication Sig Dispense Refill  . acetaminophen (TYLENOL) 325 MG tablet Take 2 tablets (650 mg total) by mouth every 6 (six) hours as needed for mild pain or fever. 60 tablet 0  . albuterol (PROVENTIL HFA;VENTOLIN HFA) 108 (90 Base) MCG/ACT inhaler Inhale 2 puffs into the lungs every 6 (six) hours as needed for wheezing or shortness of breath. 1 Inhaler 3  . albuterol (PROVENTIL) (5 MG/ML) 0.5% nebulizer solution Take 0.5 mLs (2.5 mg total) by nebulization every 6 (six) hours as needed for wheezing or shortness of breath. 20 mL 12  . amLODipine (NORVASC) 10 MG tablet Take 10 mg by mouth daily.    [name] aspirin EC 81 MG tablet Take 81 mg by mouth daily.    [name] KLOR-CON M20 20 MEQ tablet TAKE 1 TABLET BY MOUTH TWICE A DAY 60 tablet 3  . ONE TOUCH ULTRA TEST test strip CHECK BLOOD SUGAR 2-3 TIMES A DAY  3  . atorvastatin (LIPITOR) 80 MG tablet Take 1 tablet (80 mg total) by  mouth daily. 90 tablet 3  . montelukast (SINGULAIR) 10 MG tablet Take 10 mg by mouth at bedtime.      No current facility-administered medications for this visit.     Past Medical History:  Diagnosis Date  . Abnormal LFTs (liver function tests)   . Anxiety   . Aortic root dilatation ([place])    a. 12/2015 Echo: mod dil of sinus of valsalva and Ao root (51mm).  b. 53 mm Echo 2018  . BPH with urinary obstruction   . Bradycardia   . 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 CAD (coronary artery disease)    a. 12/2015 NSTEMI/Cath (in setting of PAF):  LM nl, LAD 30p,   LCX 72m, RCA  ok, AM 100%, RPDA1 40, RPDA 2 60 ->Med Rx.  . Cervical pain 12/09/2014  . Childhood asthma   . Chronic diastolic CHF (congestive heart failure) ([place])    a. 12/2015 Echo: EF 50-55%, mod AI, mod Ao root dil, mild MR, mod dil LA, mod RA.  [name] COPD (chronic obstructive pulmonary disease) ([place])   . Diabetes mellitus type II   . History of syncope   . Hyperlipidemia   . Hypertensive heart disease   . Kidney lump 04/04/2010   Overview:  Renal Cell Carcinoma   . Moderate aortic insufficiency    a. 12/2015 Echo: Mod AI.  [name] Overactive bladder   . Paroxysmal atrial fibrillation ([place])    a. 12/2015 started on Xarelto (CHA2DS2VASc = 4-5).  . Pneumonia   . Prostate cancer ([place])   . Renal cell carcinoma ([place])   . Renal lesion   . Sleep apnea    Does not use CPAP  . Spinal stenosis in cervical region 10/12/2016    Past Surgical History:  Procedure Laterality Date  . BACK SURGERY    . CARDIAC CATHETERIZATION N/A 12/18/2015   Procedure: Left Heart Cath and Coronary Angiography;  Surgeon: Leonie Man, MD;  Location: [facility];  Service: Cardiovascular;  Laterality: N/A;  . LUMBAR Weber   "replaced a disc"     ROS: As stated in the HPI and negative for all other systems.  PHYSICAL EXAM BP 122/68 (BP Location: Left Arm, Patient Position: Sitting)   Pulse (!) 54   Ht 5\' 6"  (1.676 m)   Wt 229 lb (103.9 kg)   SpO2 95%   BMI 36.96 kg/m   GENERAL:  Well appearing HEENT:  Proptosis.   NECK:  No jugular venous distention, waveform within normal limits, carotid upstroke brisk and symmetric, no bruits, no thyromegaly LUNGS:  Clear to auscultation bilaterally CHEST:  Unremarkable HEART:  PMI not displaced or sustained,S1 and S2 within normal limits, no S3, no S4, no clicks, no rubs, 2 out of 6 systolic murmur radiating slightly at the aortic outflow tract, 2 out of 6 diastolic murmur heard best at the fourth intercostal space murmurs ABD:  Flat, positive bowel  sounds normal in frequency in pitch, no bruits, no rebound, no guarding, no midline pulsatile mass, no hepatomegaly, no splenomegaly EXT:  2 plus pulses throughout, no edema, no cyanosis no clubbing    Lab Results  Component Value Date   CHOL 199 09/30/2017   TRIG 109 09/30/2017   HDL 56 09/30/2017   LDLCALC 121 (H) 09/30/2017    ASSESSMENT AND PLAN  Preop:   The patient has no high risk findings.  He  has no unstable symptoms.  He said testing as below.  No further cardiovascular testing is suggested.  He is at acceptable risk for the planned surgery.   Chronic diastolic congestive heart failure:   He seems to be euvolemic.  He will continue on the meds as listed.  AI:    He had moderate AI in Feb 2018.  I will follow up with an ehco.   Paroxysmal atrial fibrillation:     Mr. Gaudencio Chesnut Horsey has a CHA2DS2 - VASc score of 3. with a risk of stroke of 3.2%.    He has taken himself off of his anticoagulation.  We discussed the risk of stroke.  He says he will start this back after surgery.  I would ask the surgeons to start Xarelto and they feel comfortable stopping his aspirin.   Hypertensive heart disease:   His blood pressure is at target.  He is taking his Norvasc.  He will continue with meds as listed.  Dilated aortic root: He tried to have his MRI but couldn't stay in the machine.  He did have a CTA last Sept and this was stable at 4.3 cm.   He is going to get a follow-up aorta and I will plan to do this in late September  Coronary artery disease: He has not been having any chest pain. Catheterization in 2017 revealed total occlusion of the acute marginal with otherwise nonobstructive disease.  He has no symptoms.  No further testing.  Hyperlipidemia: LDL was 121 this  year.  He stopped taking his Lipitor.  He agrees to restart his Lipitor.    Sleep Apnea:   He had a repeat study which I reivewed.  This is being treated by Dr. Annamaria Boots.  He is about to get a CPAP.

## 2018-04-13 NOTE — Telephone Encounter (Signed)
Dr Hochrein's pt; will forward [name] [name]

## 2018-04-13 NOTE — Telephone Encounter (Signed)
Patient will need phone call  Also routing to Dr. Stanford Breed regarding holding of aspirin  Dr. Stanford Breed,  Please advise how long to hold aspirin if you agree.   Please route your response back to CV DIV PREOP pool  Burtis Junes, RN, [place] [address] [place], [place]  [phone]

## 2018-04-14 ENCOUNTER — Encounter: Payer: Self-pay | Admitting: Cardiology

## 2018-04-14 ENCOUNTER — Ambulatory Visit (INDEPENDENT_AMBULATORY_CARE_PROVIDER_SITE_OTHER): Payer: Medicare Other | Admitting: Cardiology

## 2018-04-14 VITALS — BP 122/68 | HR 54 | Ht 66.0 in | Wt 229.0 lb

## 2018-04-14 DIAGNOSIS — I77810 Thoracic aortic ectasia: Secondary | ICD-10-CM | POA: Diagnosis not present

## 2018-04-14 DIAGNOSIS — I351 Nonrheumatic aortic (valve) insufficiency: Secondary | ICD-10-CM | POA: Diagnosis not present

## 2018-04-14 DIAGNOSIS — Z79899 Other long term (current) drug therapy: Secondary | ICD-10-CM | POA: Diagnosis not present

## 2018-04-14 DIAGNOSIS — I5032 Chronic diastolic (congestive) heart failure: Secondary | ICD-10-CM | POA: Diagnosis not present

## 2018-04-14 DIAGNOSIS — E785 Hyperlipidemia, unspecified: Secondary | ICD-10-CM

## 2018-04-14 MED ORDER — ATORVASTATIN CALCIUM 80 MG PO TABS: 80.0000 mg | ORAL_TABLET | Freq: Every day | ORAL | 3 refills | Status: DC

## 2018-04-14 NOTE — Patient Instructions (Signed)
Medication Instructions:  RESTART- Lipitor 80 mg daily  If you need a refill on your cardiac medications before your next appointment, please call your pharmacy.  Labwork: BMP in September HERE IN OUR OFFICE AT [facility]  Take the provided lab slips with you to the lab for your blood draw.   You will NOT need to fast   Testing/Procedures: Your physician has requested that you have an echocardiogram. Echocardiography is a painless test that uses sound waves to create images of your heart. It provides your doctor with information about the size and shape of your heart and how well your heart's chambers and valves are working. This procedure takes approximately one hour. There are no restrictions for this procedure.  Non-Cardiac CT Angiography (CTA) in September, is a special type of CT scan that uses a computer to produce multi-dimensional views of major blood vessels throughout the body. In CT angiography, a contrast material is injected through an IV to help visualize the blood vessels   Follow-Up: Your physician wants you to follow-up in: 6 Months. You should receive a reminder letter in the mail two months in advance. If you do not receive a letter, please call our office to [phone].    Thank you for choosing [facility] at [facility]!!

## 2018-04-15 NOTE — Telephone Encounter (Signed)
See office note

## 2018-04-15 NOTE — Telephone Encounter (Signed)
Dr Tana Coast note faxed to [facility] via Epic

## 2018-04-20 ENCOUNTER — Ambulatory Visit: Payer: Self-pay | Admitting: General Surgery

## 2018-04-20 NOTE — H&P (Signed)
History of Present Illness [name] Ruff MD; 04/02/174 9:57 AM) The patient is a 72 year old male who presents with a colonic polyp. 72 year old male who underwent a colonoscopy for anemia. Several polyps were removed during the colonoscopy. A large polyp was noted around the ileocecal valve that was biopsied. Biopsy returned as tubulovillous adenoma. CT scan does seem to demonstrate the mass in the cecum. Patient was also noted to have some liver nodularity. Patient has a history of coronary artery disease and atrial fibrillation. He also has an aortic aneurysm. He is not taking his anticoagulation currently.   Past Surgical History [name] [name]; 04/07/2018 9:33 AM) Cataract Surgery  Left. Spinal Surgery - Lower Back   Diagnostic Studies History [name] [name]; 04/07/2018 9:33 AM) Colonoscopy  within last year  Allergies [name] [name]; 04/07/2018 9:36 AM) Penicillins  Anaphylaxis. Amoxicillin *PENICILLINS*  Anaphylaxis. Allergies Reconciled   Medication History [name] [name]; 04/07/2018 9:38 AM) Klor-Con [facility] Tablet ER, Oral) Active. ProAir HFA (108 (90 Base)MCG/ACT Aerosol Soln, Inhalation) Active. AmLODIPine Besylate (10MG  Tablet, Oral) Active. Omega 3 (Oral) Specific strength unknown - Active. Medications Reconciled  Social History [name] [name]; 04/07/2018 9:33 AM) Alcohol use  Moderate alcohol use. Caffeine use  Carbonated beverages, Coffee, Tea. No drug use  Tobacco use  Former smoker.  Family History [name] [name]; 04/07/2018 9:33 AM) Alcohol Abuse  Brother. Arthritis  Sister. Breast Cancer  Sister. Cerebrovascular Accident  Family Members In General, Mother. Depression  Family Members In General. Diabetes Mellitus  Mother. Hypertension  Brother, Daughter, Father, Mother, Sister, Son. Kidney Disease  Family Members In General. Migraine Headache  Daughter, Sister. Prostate Cancer  Family Members In  General. Respiratory Condition  Son. Thyroid problems  Daughter.  Other Problems [name] [name]; 04/07/2018 9:33 AM) Asthma  Back Pain  Bladder Problems  Enlarged Prostate  Gastroesophageal Reflux Disease  High blood pressure  Hypercholesterolemia  Sleep Apnea     Review of Systems [name] [name]; 04/07/2018 9:33 AM) General Not Present- Appetite Loss, Chills, Fatigue, Fever, Night Sweats, Weight Gain and Weight Loss. Skin Not Present- Change in Wart/Mole, Dryness, Hives, Jaundice, New Lesions, Non-Healing Wounds, Rash and Ulcer. HEENT Present- Ringing in the Ears. Not Present- Earache, Hearing Loss, Hoarseness, Nose Bleed, Oral Ulcers, Seasonal Allergies, Sinus Pain, Sore Throat, Visual Disturbances, Wears glasses/contact lenses and Yellow Eyes. Respiratory Not Present- Bloody sputum, Chronic Cough, Difficulty Breathing, Snoring and Wheezing. Breast Not Present- Breast Mass, Breast Pain, Nipple Discharge and Skin Changes. Cardiovascular Not Present- Chest Pain, Difficulty Breathing Lying Down, Leg Cramps, Palpitations, Rapid Heart Rate, Shortness of Breath and Swelling of Extremities. Gastrointestinal Not Present- Abdominal Pain, Bloating, Bloody Stool, Change in Bowel Habits, Chronic diarrhea, Constipation, Difficulty Swallowing, Excessive gas, Gets full quickly at meals, Hemorrhoids, Indigestion, Nausea, Rectal Pain and Vomiting. Male Genitourinary Present- Frequency, Nocturia, Urgency and Urine Leakage. Not Present- Blood in Urine, Change in Urinary Stream, Impotence and Painful Urination. Musculoskeletal Not Present- Back Pain, Joint Pain, Joint Stiffness, Muscle Pain, Muscle Weakness and Swelling of Extremities. Neurological Not Present- Decreased Memory, Fainting, Headaches, Numbness, Seizures, Tingling, Tremor, Trouble walking and Weakness. Psychiatric Not Present- Anxiety, Bipolar, Change in Sleep Pattern, Depression, Fearful and Frequent crying. Endocrine Not  Present- Cold Intolerance, Excessive Hunger, Hair Changes, Heat Intolerance, Hot flashes and New Diabetes. Hematology Not Present- Blood Thinners, Easy Bruising, Excessive bleeding, Gland problems, HIV and Persistent Infections.  Vitals [name] [name]; 04/07/2018 9:34 AM) 04/07/2018 9:33 AM Weight: 225.45 lb Height: 68in Body Surface Area: 2.15 m Body Mass Index: 34.28 kg/m  Temp.: 97.55F  Pulse: 66 (Regular)  BP: 132/84 (Sitting, Left Arm, Standard)       Physical Exam [name] Ruff MD; 01/09/7415 9:58 AM) General Mental Status-Alert. General Appearance-Not in acute distress. Build & Nutrition-Well nourished. Posture-Normal posture. Gait-Normal.  Head and Neck Head-normocephalic, atraumatic with no lesions or palpable masses. Trachea-midline.  Chest and Lung Exam Chest and lung exam reveals -on auscultation, normal breath sounds, no adventitious sounds and normal vocal resonance.  Cardiovascular Cardiovascular examination reveals -normal heart sounds, regular rate and rhythm with no murmurs and no digital clubbing, cyanosis, edema, increased warmth or tenderness.  Abdomen Inspection Inspection of the abdomen reveals - No Hernias. Palpation/Percussion Palpation and Percussion of the abdomen reveal - Soft, Non Tender, No Rigidity (guarding), No hepatosplenomegaly and No Palpable abdominal masses.  Neurologic Neurologic evaluation reveals -alert and oriented x 3 with no impairment of recent or remote memory, normal attention span and ability to concentrate, normal sensation and normal coordination.  Musculoskeletal Normal Exam - Bilateral-Upper Extremity Strength Normal and Lower Extremity Strength Normal.    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 Biopsies show adenoma. Patient has significant cardiac history and a history of an aortic aneurysm. We will get cardiac clearance prior to scheduling surgery. I will get lab work today to evaluate his liver and kidney function as well as a baseline CEA. The surgery and anatomy were described to the patient as well as the risks of surgery and the possible complications.  These include: Bleeding, deep abdominal infections and possible wound complications such as hernia and infection, damage to adjacent structures, leak of surgical connections, which can lead to other surgeries and possibly an ostomy, possible need for other procedures, such as abscess drains in radiology, possible prolonged hospital stay, possible diarrhea from removal of part of the colon, possible constipation from narcotics, prolonged fatigue/weakness or appetite loss, possible early recurrence of of disease, possible complications of their medical problems such as heart disease or arrhythmias or lung problems, death (less than 1%). I believe the patient understands and wishes to proceed with the surgery.

## 2018-04-29 ENCOUNTER — Ambulatory Visit: Payer: Self-pay | Admitting: General Surgery

## 2018-04-29 MED ORDER — DEXTROSE 5 % IV SOLN: 900.0000 mg | INTRAVENOUS | Status: AC

## 2018-04-29 MED ORDER — GENTAMICIN SULFATE 40 MG/ML IJ SOLN: 5.0000 mg/kg | INTRAVENOUS | Status: AC

## 2018-05-12 NOTE — Patient Instructions (Addendum)
Gilmar A Harpole  05/12/2018   Your procedure is scheduled on: 05-21-18    Report to [facility] Main  Entrance              Report to admitting at     0530 AM    Call this number if you have problems the morning of surgery [phone]    Remember: FOLLOW BOWEL PREP PER DR [place]wood LIQUID DIET   Foods Allowed                                                                     Foods Excluded  Coffee and tea, regular and decaf                             liquids that you cannot  Plain Jell-O in any flavor                                             see through such as: Fruit ices (not with fruit pulp)                                     milk, soups, orange juice  Iced Popsicles                                    All solid food Carbonated beverages, regular and diet                                    Cranberry, grape and apple juices Sports drinks like Gatorade Lightly seasoned clear broth or consume(fat free) Sugar, honey syrup  Sample Menu Breakfast                                Lunch                                     Supper Cranberry juice                    Beef broth                            Chicken broth Jell-O                                     Grape juice  Apple juice Coffee or tea                        Jell-O                                      Popsicle                                                Coffee or tea                        Coffee or tea  _____________________________________________________________________  DRINK 2 PRESURGERY ENSURE DRINKS THE NIGHT BEFORE SURGERY AT  1000 PM AND 1 PRESURGERY DRINK THE DAY OF THE PROCEDURE 3 HOURS PRIOR TO SCHEDULED SURGERY. NO SOLIDS AFTER MIDNIGHT THE DAY PRIOR TO THE SURGERY. NOTHING BY MOUTH EXCEPT CLEAR LIQUIDS UNTIL THREE HOURS PRIOR TO SCHEDULED SURGERY. PLEASE FINISH PRESURGERY ENSURE DRINK PER  SURGEON ORDER 3 HOURS PRIOR TO SCHEDULED SURGERY TIME WHICH NEEDS TO BE COMPLETED AT ____0430 AM_____.   Take these medicines the morning of surgery with A SIP OF WATER: atorvastatin, amlodipine, inhalers and bring  DO NOT TAKE ANY DIABETIC MEDICATIONS DAY OF YOUR SURGERY                               You may not have any metal on your body including hair pins and              piercings  Do not wear jewelry,lotions, powders or perfumes, deodorant                         Men may shave face and neck.   Do not bring valuables to the hospital. [place].  Contacts, dentures or bridgework may not be worn into surgery.  Leave suitcase in the car. After surgery it may be brought to your room.               Please read over the following fact sheets you were given: _____________________________________________________________________          [facility] - Preparing for Surgery Before surgery, you can play an important role.  Because skin is not sterile, your skin needs to be as free of germs as possible.  You can reduce the number of germs on your skin by washing with CHG (chlorahexidine gluconate) soap before surgery.  CHG is an antiseptic cleaner which kills germs and bonds with the skin to continue killing germs even after washing. Please DO NOT use if you have an allergy to CHG or antibacterial soaps.  If your skin becomes reddened/irritated stop using the CHG and inform your nurse when you arrive at Short Stay. Do not shave (including legs and underarms) for at least 48 hours prior to the first CHG shower.  You may shave your face/neck. Please follow these instructions carefully:  1.  Shower with CHG Soap the night before surgery and the  morning of Surgery.  2.  If you choose to wash your hair, wash  your hair first as usual with your  normal  shampoo.  3.  After you shampoo, rinse your hair and body thoroughly to remove the  shampoo.                            4.  Use CHG as you would any other liquid soap.  You can apply chg directly  to the skin and wash                       Gently with a scrungie or clean washcloth.  5.  Apply the CHG Soap to your body ONLY FROM THE NECK DOWN.   Do not use on face/ open                           Wound or open sores. Avoid contact with eyes, ears mouth and genitals (private parts).                       Wash face,  Genitals (private parts) with your normal soap.             6.  Wash thoroughly, paying special attention to the area where your surgery  will be performed.  7.  Thoroughly rinse your body with warm water from the neck down.  8.  DO NOT shower/wash with your normal soap after using and rinsing off  the CHG Soap.                9.  Pat yourself dry with a clean towel.            10.  Wear clean pajamas.            11.  Place clean sheets on your bed the night of your first shower and do not  sleep with pets. Day of Surgery : Do not apply any lotions/deodorants the morning of surgery.  Please wear clean clothes to the hospital/surgery center.  FAILURE TO FOLLOW THESE INSTRUCTIONS MAY RESULT IN THE CANCELLATION OF YOUR SURGERY PATIENT SIGNATURE_________________________________  NURSE SIGNATURE__________________________________  ________________________________________________________________________  WHAT IS A BLOOD TRANSFUSION? Blood Transfusion Information  A transfusion is the replacement of blood or some of its parts. Blood is made up of multiple cells which provide different functions.  Red blood cells carry oxygen and are used for blood loss replacement.  White blood cells fight against infection.  Platelets control bleeding.  Plasma helps clot blood.  Other blood products are available for specialized needs, such as hemophilia or other clotting disorders. BEFORE THE TRANSFUSION  Who gives blood for transfusions?   Healthy volunteers who are fully evaluated to make  sure their blood is safe. This is blood bank blood. Transfusion therapy is the safest it has ever been in the practice of medicine. Before blood is taken from a donor, a complete history is taken to make sure that person has no history of diseases nor engages in risky social behavior (examples are intravenous drug use or sexual activity with multiple partners). The donor's travel history is screened to minimize risk of transmitting infections, such as malaria. The donated blood is tested for signs of infectious diseases, such as HIV and hepatitis. The blood is then tested to be sure it is compatible with you in order to minimize the chance of a transfusion reaction. If you or a relative donates blood,  this is often done in anticipation of surgery and is not appropriate for emergency situations. It takes many days to process the donated blood. RISKS AND COMPLICATIONS Although transfusion therapy is very safe and saves many lives, the main dangers of transfusion include:   Getting an infectious disease.  Developing a transfusion reaction. This is an allergic reaction to something in the blood you were given. Every precaution is taken to prevent this. The decision to have a blood transfusion has been considered carefully by your caregiver before blood is given. Blood is not given unless the benefits outweigh the risks. AFTER THE TRANSFUSION  Right after receiving a blood transfusion, you will usually feel much better and more energetic. This is especially true if your red blood cells have gotten low (anemic). The transfusion raises the level of the red blood cells which carry oxygen, and this usually causes an energy increase.  The nurse administering the transfusion will monitor you carefully for complications. HOME CARE INSTRUCTIONS  No special instructions are needed after a transfusion. You may find your energy is better. Speak with your caregiver about any limitations on activity for underlying  diseases you may have. SEEK MEDICAL CARE IF:   Your condition is not improving after your transfusion.  You develop redness or irritation at the intravenous (IV) site. SEEK IMMEDIATE MEDICAL CARE IF:  Any of the following symptoms occur over the next 12 hours:  Shaking chills.   Incentive Spirometer  An incentive spirometer is a tool that can help keep your lungs clear and active. This tool measures how well you are filling your lungs with each breath. Taking long deep breaths may help reverse or decrease the chance of developing breathing (pulmonary) problems (especially infection) following: A long period of time when you are unable to move or be active. BEFORE THE PROCEDURE  If the spirometer includes an indicator to show your best effort, your nurse or respiratory therapist will set it to a desired goal. If possible, sit up straight or lean slightly forward. Try not to slouch. Hold the incentive spirometer in an upright position. INSTRUCTIONS FOR USE  Sit on the edge of your bed if possible, or sit up as far as you can in bed or on a chair. Hold the incentive spirometer in an upright position. Breathe out normally. Place the mouthpiece in your mouth and seal your lips tightly around it. Breathe in slowly and as deeply as possible, raising the piston or the ball toward the top of the column. Hold your breath for 3-5 seconds or for as long as possible. Allow the piston or ball to fall to the bottom of the column. Remove the mouthpiece from your mouth and breathe out normally. Rest for a few seconds and repeat Steps 1 through 7 at least 10 times every 1-2 hours when you are awake. Take your time and take a few normal breaths between deep breaths. The spirometer may include an indicator to show your best effort. Use the indicator as a goal to work toward during each repetition. After each set of 10 deep breaths, practice coughing to be sure your lungs are clear. If you have an incision  (the cut made at the time of surgery), support your incision when coughing by placing a pillow or rolled up towels firmly against it. Once you are able to get out of bed, walk around indoors and cough well. You may stop using the incentive spirometer when instructed by your caregiver.  RISKS AND COMPLICATIONS  Take your time so you do not get dizzy or light-headed. If you are in pain, you may need to take or ask for pain medication before doing incentive spirometry. It is harder to take a deep breath if you are having pain. AFTER USE Rest and breathe slowly and easily. It can be helpful to keep track of a log of your progress. Your caregiver can provide you with a simple table to help with this. If you are using the spirometer at home, follow these instructions: [place] IF:  You are having difficultly using the spirometer. You have trouble using the spirometer as often as instructed. Your pain medication is not giving enough relief while using the spirometer. You develop fever of 100.5 F (38.1 C) or higher. SEEK IMMEDIATE MEDICAL CARE IF:  You cough up bloody sputum that had not been present before. You develop fever of 102 F (38.9 C) or greater. You develop worsening pain at or near the incision site. MAKE SURE YOU:  Understand these instructions. Will watch your condition. Will get help right away if you are not doing well or get worse. Document Released: 12/30/2006 Document Revised: 11/11/2011 Document Reviewed: 03/02/2007 ExitCare Patient Information 2014 ExitCare, [place].   ________________________________________________________________________  Dennis Bast have a temperature by mouth above 102 F (38.9 C), not controlled by medicine.  Chest, back, or muscle pain.  People around you feel you are not acting correctly or are confused.  Shortness of breath or difficulty breathing.  Dizziness and fainting.  You get a rash or develop hives.  You have a decrease in urine  output.  Your urine turns a dark color or changes to pink, red, or brown. Any of the following symptoms occur over the next 10 days:  You have a temperature by mouth above 102 F (38.9 C), not controlled by medicine.  Shortness of breath.  Weakness after normal activity.  The white part of the eye turns yellow (jaundice).  You have a decrease in the amount of urine or are urinating less often.  Your urine turns a dark color or changes to pink, red, or brown. Document Released: 08/16/2000 Document Revised: 11/11/2011 Document Reviewed: 04/04/2008 [facility] Patient Information 2014 [place], [place].  _______________________________________________________________________

## 2018-05-13 ENCOUNTER — Encounter (HOSPITAL_COMMUNITY)
Admission: RE | Admit: 2018-05-13 | Discharge: 2018-05-13 | Disposition: A | Discharge status: Home / Self Care | Payer: Medicare Other | Source: Ambulatory Visit | Attending: General Surgery | Admitting: General Surgery

## 2018-05-13 ENCOUNTER — Other Ambulatory Visit: Payer: Self-pay

## 2018-05-13 ENCOUNTER — Encounter (HOSPITAL_COMMUNITY): Payer: Self-pay

## 2018-05-13 DIAGNOSIS — Z87891 Personal history of nicotine dependence: Secondary | ICD-10-CM | POA: Diagnosis not present

## 2018-05-13 DIAGNOSIS — Z01818 Encounter for other preprocedural examination: Secondary | ICD-10-CM | POA: Diagnosis not present

## 2018-05-13 DIAGNOSIS — I714 Abdominal aortic aneurysm, without rupture: Secondary | ICD-10-CM | POA: Diagnosis not present

## 2018-05-13 DIAGNOSIS — I2510 Atherosclerotic heart disease of native coronary artery without angina pectoris: Secondary | ICD-10-CM | POA: Diagnosis not present

## 2018-05-13 DIAGNOSIS — K219 Gastro-esophageal reflux disease without esophagitis: Secondary | ICD-10-CM | POA: Insufficient documentation

## 2018-05-13 DIAGNOSIS — I4891 Unspecified atrial fibrillation: Secondary | ICD-10-CM | POA: Insufficient documentation

## 2018-05-13 DIAGNOSIS — G4730 Sleep apnea, unspecified: Secondary | ICD-10-CM | POA: Diagnosis not present

## 2018-05-13 DIAGNOSIS — N400 Enlarged prostate without lower urinary tract symptoms: Secondary | ICD-10-CM | POA: Diagnosis not present

## 2018-05-13 DIAGNOSIS — K635 Polyp of colon: Secondary | ICD-10-CM | POA: Insufficient documentation

## 2018-05-13 DIAGNOSIS — E7800 Pure hypercholesterolemia, unspecified: Secondary | ICD-10-CM | POA: Diagnosis not present

## 2018-05-13 DIAGNOSIS — Z79899 Other long term (current) drug therapy: Secondary | ICD-10-CM | POA: Insufficient documentation

## 2018-05-13 DIAGNOSIS — R030 Elevated blood-pressure reading, without diagnosis of hypertension: Secondary | ICD-10-CM | POA: Diagnosis not present

## 2018-05-13 LAB — BASIC METABOLIC PANEL
Anion gap: 9 (ref 5–15)
BUN: 28 mg/dL — ABNORMAL HIGH (ref 8–23)
CALCIUM: 9.3 mg/dL (ref 8.9–10.3)
CO2: 31 mmol/L (ref 22–32)
CREATININE: 1.56 mg/dL — AB (ref 0.61–1.24)
Chloride: 102 mmol/L (ref 98–111)
GFR, EST AFRICAN AMERICAN: 49 mL/min — AB (ref >60)
GFR, EST NON AFRICAN AMERICAN: 42 mL/min — AB (ref >60)
GLUCOSE: 98 mg/dL (ref 70–99)
Potassium: 3.5 mmol/L (ref 3.5–5.1)
Sodium: 142 mmol/L (ref 135–145)

## 2018-05-13 LAB — CBC
HCT: 32.2 % — ABNORMAL LOW (ref 39.0–52.0)
Hemoglobin: 10.6 g/dL — ABNORMAL LOW (ref 13.0–17.0)
MCH: 31.5 pg (ref 26.0–34.0)
MCHC: 32.9 g/dL (ref 30.0–36.0)
MCV: 95.5 fL (ref 78.0–100.0)
PLATELETS: 278 10*3/uL (ref 150–400)
RBC: 3.37 MIL/uL — ABNORMAL LOW (ref 4.22–5.81)
RDW: 13.3 % (ref 11.5–15.5)
WBC: 4.8 10*3/uL (ref 4.0–10.5)

## 2018-05-13 LAB — ABO/RH: ABO/RH(D): A POS

## 2018-05-13 LAB — HEMOGLOBIN A1C
HEMOGLOBIN A1C: 5.2 % (ref 4.8–5.6)
Mean Plasma Glucose: 102.54 mg/dL

## 2018-05-13 NOTE — Progress Notes (Signed)
Clearance 04-14-18 cardiology Dr. Minus Breeding   Echo 10-12-17 epic  cxr 08-29-17 epic  ekg 01-07-18 epic

## 2018-05-13 NOTE — Progress Notes (Signed)
Cbc and bmp done 05-13-18 routed to Dr. Marcello Moores via epic

## 2018-05-13 NOTE — Progress Notes (Signed)
Chart left for [place] room 4

## 2018-05-14 LAB — CEA: CEA: 1.9 ng/mL (ref 0.0–4.7)

## 2018-05-19 ENCOUNTER — Ambulatory Visit (INDEPENDENT_AMBULATORY_CARE_PROVIDER_SITE_OTHER)
Admission: RE | Admit: 2018-05-19 | Discharge: 2018-05-19 | Disposition: A | Discharge status: Home / Self Care | Payer: Medicare Other | Source: Ambulatory Visit | Attending: Cardiology | Admitting: Cardiology

## 2018-05-19 DIAGNOSIS — I712 Thoracic aortic aneurysm, without rupture, unspecified: Secondary | ICD-10-CM

## 2018-05-19 DIAGNOSIS — I77810 Thoracic aortic ectasia: Secondary | ICD-10-CM | POA: Diagnosis not present

## 2018-05-19 MED ORDER — IOPAMIDOL (ISOVUE-370) INJECTION 76%
100.0000 mL | Freq: Once | INTRAVENOUS | Status: AC | PRN
  Administered 2018-05-19: 100 mL via INTRAVENOUS

## 2018-05-20 MED ORDER — GENTAMICIN SULFATE 40 MG/ML IJ SOLN
400.0000 mg | INTRAVENOUS | Status: AC
  Administered 2018-05-21: 400 mg via INTRAVENOUS
  Filled 2018-05-20: qty 10

## 2018-05-20 MED ORDER — BUPIVACAINE LIPOSOME 1.3 % IJ SUSP
20.0000 mL | Freq: Once | INTRAMUSCULAR | Status: DC
  Filled 2018-05-20: qty 20

## 2018-05-20 MED ORDER — CLINDAMYCIN PHOSPHATE 900 MG/50ML IV SOLN
900.0000 mg | INTRAVENOUS | Status: AC
  Administered 2018-05-21: 900 mg via INTRAVENOUS
  Filled 2018-05-20: qty 50

## 2018-05-20 NOTE — Anesthesia Preprocedure Evaluation (Addendum)
Anesthesia Evaluation  Patient identified by MRN, date of birth, ID band Patient awake    Reviewed: Allergy & Precautions, NPO status , Patient's Chart, lab work & pertinent test results  Airway Mallampati: II  TM Distance: >3 FB Neck ROM: Full    Dental no notable dental hx. (+) Teeth Intact, Dental Advisory Given, Poor Dentition,    Pulmonary asthma , sleep apnea , former smoker,    Pulmonary exam normal breath sounds clear to auscultation       Cardiovascular hypertension, + CAD  negative cardio ROS Normal cardiovascular exam+ dysrhythmias Atrial Fibrillation  Rhythm:Regular Rate:Normal  Echo 10/12/16 Left ventricle: The cavity size was normal. There was severe   concentric hypertrophy. Systolic function was normal. The   estimated ejection fraction was in the range of 60% to 65%. Wall   motion was normal; there were no regional wall motion   abnormalities. Doppler parameters are consistent with abnormal   left ventricular relaxation (grade 1 diastolic dysfunction). - Aortic valve: There was moderate regurgitation   Neuro/Psych    GI/Hepatic negative GI ROS,   Endo/Other  diabetesMorbid obesity  Renal/GU      Musculoskeletal  (+) Arthritis ,   Abdominal (+) + obese,   Peds  Hematology   Anesthesia Other Findings Pt did not do pre surgery drink  Reproductive/Obstetrics                           Lab Results  Component Value Date   CREATININE 1.56 (H) 05/13/2018   BUN 28 (H) 05/13/2018   NA 142 05/13/2018   K 3.5 05/13/2018   CL 102 05/13/2018   CO2 31 05/13/2018    Lab Results  Component Value Date   WBC 4.8 05/13/2018   HGB 10.6 (L) 05/13/2018   HCT 32.2 (L) 05/13/2018   MCV 95.5 05/13/2018   PLT 278 05/13/2018    Anesthesia Physical Anesthesia Plan  ASA: III  Anesthesia Plan: General   Post-op Pain Management:    Induction: Intravenous  PONV Risk Score and Plan:  Treatment may vary due to age or medical condition, Ondansetron and Dexamethasone  Airway Management Planned: Oral ETT  Additional Equipment:   Intra-op Plan:   Post-operative Plan: Extubation in OR  Informed Consent: I have reviewed the patients History and Physical, chart, labs and discussed the procedure including the risks, benefits and alternatives for the proposed anesthesia with the patient or authorized representative who has indicated his/her understanding and acceptance.   Dental advisory given  Plan Discussed with:   Anesthesia Plan Comments: (Eras W lidocaine Drip)       Anesthesia Quick Evaluation

## 2018-05-21 ENCOUNTER — Encounter (HOSPITAL_COMMUNITY)
Admission: RE | Disposition: A | Discharge status: Home / Self Care | Payer: Self-pay | Source: Home / Self Care | Attending: General Surgery

## 2018-05-21 ENCOUNTER — Inpatient Hospital Stay (HOSPITAL_COMMUNITY): Payer: Medicare Other | Admitting: Anesthesiology

## 2018-05-21 ENCOUNTER — Other Ambulatory Visit: Payer: Self-pay

## 2018-05-21 ENCOUNTER — Inpatient Hospital Stay (HOSPITAL_COMMUNITY)
Admission: RE | Admit: 2018-05-21 | Discharge: 2018-05-26 | DRG: 331 | Disposition: A | Discharge status: Home / Self Care | Payer: Medicare Other | Attending: General Surgery | Admitting: General Surgery

## 2018-05-21 ENCOUNTER — Encounter (HOSPITAL_COMMUNITY): Payer: Self-pay | Admitting: *Deleted

## 2018-05-21 DIAGNOSIS — Z8042 Family history of malignant neoplasm of prostate: Secondary | ICD-10-CM

## 2018-05-21 DIAGNOSIS — Z8249 Family history of ischemic heart disease and other diseases of the circulatory system: Secondary | ICD-10-CM | POA: Diagnosis not present

## 2018-05-21 DIAGNOSIS — Z23 Encounter for immunization: Secondary | ICD-10-CM | POA: Diagnosis present

## 2018-05-21 DIAGNOSIS — I10 Essential (primary) hypertension: Secondary | ICD-10-CM | POA: Diagnosis present

## 2018-05-21 DIAGNOSIS — I2510 Atherosclerotic heart disease of native coronary artery without angina pectoris: Secondary | ICD-10-CM | POA: Diagnosis present

## 2018-05-21 DIAGNOSIS — Z87891 Personal history of nicotine dependence: Secondary | ICD-10-CM | POA: Diagnosis not present

## 2018-05-21 DIAGNOSIS — Z823 Family history of stroke: Secondary | ICD-10-CM | POA: Diagnosis not present

## 2018-05-21 DIAGNOSIS — J4520 Mild intermittent asthma, uncomplicated: Secondary | ICD-10-CM | POA: Diagnosis present

## 2018-05-21 DIAGNOSIS — Z803 Family history of malignant neoplasm of breast: Secondary | ICD-10-CM | POA: Diagnosis not present

## 2018-05-21 DIAGNOSIS — D122 Benign neoplasm of ascending colon: Secondary | ICD-10-CM | POA: Diagnosis present

## 2018-05-21 DIAGNOSIS — I719 Aortic aneurysm of unspecified site, without rupture: Secondary | ICD-10-CM | POA: Diagnosis present

## 2018-05-21 DIAGNOSIS — Z833 Family history of diabetes mellitus: Secondary | ICD-10-CM | POA: Diagnosis not present

## 2018-05-21 DIAGNOSIS — Z8261 Family history of arthritis: Secondary | ICD-10-CM | POA: Diagnosis not present

## 2018-05-21 DIAGNOSIS — Z880 Allergy status to penicillin: Secondary | ICD-10-CM | POA: Diagnosis not present

## 2018-05-21 DIAGNOSIS — N139 Obstructive and reflux uropathy, unspecified: Secondary | ICD-10-CM

## 2018-05-21 DIAGNOSIS — C182 Malignant neoplasm of ascending colon: Principal | ICD-10-CM | POA: Diagnosis present

## 2018-05-21 DIAGNOSIS — K635 Polyp of colon: Secondary | ICD-10-CM

## 2018-05-21 HISTORY — PX: LAPAROSCOPIC PARTIAL COLECTOMY: SHX5907

## 2018-05-21 LAB — GLUCOSE, CAPILLARY
GLUCOSE-CAPILLARY: 86 mg/dL (ref 70–99)
GLUCOSE-CAPILLARY: 140 mg/dL — AB (ref 70–99)

## 2018-05-21 LAB — TYPE AND SCREEN
ABO/RH(D): A POS
Antibody Screen: NEGATIVE

## 2018-05-21 SURGERY — LAPAROSCOPIC PARTIAL COLECTOMY
Anesthesia: General | Laterality: Right

## 2018-05-21 MED ORDER — BUPIVACAINE-EPINEPHRINE (PF) 0.25% -1:200000 IJ SOLN
INTRAMUSCULAR | Status: AC
  Filled 2018-05-21: qty 30

## 2018-05-21 MED ORDER — LACTATED RINGERS IV SOLN
INTRAVENOUS | Status: DC
  Administered 2018-05-21 – 2018-05-26 (×7): via INTRAVENOUS

## 2018-05-21 MED ORDER — ALUM & MAG HYDROXIDE-SIMETH 200-200-20 MG/5ML PO SUSP: 30.0000 mL | Freq: Four times a day (QID) | ORAL | Status: DC | PRN

## 2018-05-21 MED ORDER — LACTATED RINGERS IR SOLN
Status: DC | PRN
  Administered 2018-05-21: 1000 mL

## 2018-05-21 MED ORDER — ALVIMOPAN 12 MG PO CAPS
12.0000 mg | ORAL_CAPSULE | Freq: Two times a day (BID) | ORAL | Status: DC
  Administered 2018-05-22: 12 mg via ORAL
  Filled 2018-05-21: qty 1

## 2018-05-21 MED ORDER — ONDANSETRON HCL 4 MG/2ML IJ SOLN
INTRAMUSCULAR | Status: AC
  Filled 2018-05-21: qty 2

## 2018-05-21 MED ORDER — CHLORHEXIDINE GLUCONATE CLOTH 2 % EX PADS: 6.0000 | MEDICATED_PAD | Freq: Once | CUTANEOUS | Status: DC

## 2018-05-21 MED ORDER — GABAPENTIN 300 MG PO CAPS
300.0000 mg | ORAL_CAPSULE | Freq: Two times a day (BID) | ORAL | Status: DC
  Administered 2018-05-21 – 2018-05-26 (×10): 300 mg via ORAL
  Filled 2018-05-21 (×10): qty 1

## 2018-05-21 MED ORDER — DEXAMETHASONE SODIUM PHOSPHATE 10 MG/ML IJ SOLN
INTRAMUSCULAR | Status: AC
  Filled 2018-05-21: qty 1

## 2018-05-21 MED ORDER — FENTANYL CITRATE (PF) 100 MCG/2ML IJ SOLN: 25.0000 ug | INTRAMUSCULAR | Status: DC | PRN

## 2018-05-21 MED ORDER — KETAMINE HCL 10 MG/ML IJ SOLN
INTRAMUSCULAR | Status: DC | PRN
  Administered 2018-05-21: 30 mg via INTRAVENOUS

## 2018-05-21 MED ORDER — INFLUENZA VAC SPLIT HIGH-DOSE 0.5 ML IM SUSY
0.5000 mL | PREFILLED_SYRINGE | INTRAMUSCULAR | Status: AC
  Administered 2018-05-22: 0.5 mL via INTRAMUSCULAR
  Filled 2018-05-21: qty 0.5

## 2018-05-21 MED ORDER — ALVIMOPAN 12 MG PO CAPS
12.0000 mg | ORAL_CAPSULE | ORAL | Status: AC
  Administered 2018-05-21: 12 mg via ORAL
  Filled 2018-05-21: qty 1

## 2018-05-21 MED ORDER — ACETAMINOPHEN 500 MG PO TABS: 1000.0000 mg | ORAL_TABLET | Freq: Once | ORAL | Status: DC

## 2018-05-21 MED ORDER — ALBUTEROL SULFATE HFA 108 (90 BASE) MCG/ACT IN AERS: 2.0000 | INHALATION_SPRAY | Freq: Four times a day (QID) | RESPIRATORY_TRACT | Status: DC | PRN

## 2018-05-21 MED ORDER — EPHEDRINE 5 MG/ML INJ
INTRAVENOUS | Status: AC
  Filled 2018-05-21: qty 10

## 2018-05-21 MED ORDER — ALBUTEROL SULFATE (2.5 MG/3ML) 0.083% IN NEBU: 2.5000 mg | INHALATION_SOLUTION | Freq: Four times a day (QID) | RESPIRATORY_TRACT | Status: DC | PRN

## 2018-05-21 MED ORDER — BUPIVACAINE-EPINEPHRINE 0.25% -1:200000 IJ SOLN
INTRAMUSCULAR | Status: DC | PRN
  Administered 2018-05-21: 30 mL

## 2018-05-21 MED ORDER — BUPIVACAINE LIPOSOME 1.3 % IJ SUSP
INTRAMUSCULAR | Status: DC | PRN
  Administered 2018-05-21: 20 mL

## 2018-05-21 MED ORDER — PROPOFOL 10 MG/ML IV BOLUS
INTRAVENOUS | Status: DC | PRN
  Administered 2018-05-21: 130 mg via INTRAVENOUS

## 2018-05-21 MED ORDER — ONDANSETRON HCL 4 MG/2ML IJ SOLN: 4.0000 mg | Freq: Once | INTRAMUSCULAR | Status: DC | PRN

## 2018-05-21 MED ORDER — ONDANSETRON HCL 4 MG/2ML IJ SOLN: 4.0000 mg | Freq: Four times a day (QID) | INTRAMUSCULAR | Status: DC | PRN

## 2018-05-21 MED ORDER — SUGAMMADEX SODIUM 500 MG/5ML IV SOLN
INTRAVENOUS | Status: AC
  Filled 2018-05-21: qty 5

## 2018-05-21 MED ORDER — SUGAMMADEX SODIUM 200 MG/2ML IV SOLN
INTRAVENOUS | Status: DC | PRN
  Administered 2018-05-21: 200 mg via INTRAVENOUS

## 2018-05-21 MED ORDER — CLINDAMYCIN PHOSPHATE 900 MG/50ML IV SOLN
900.0000 mg | Freq: Three times a day (TID) | INTRAVENOUS | Status: AC
  Administered 2018-05-21: 900 mg via INTRAVENOUS
  Filled 2018-05-21: qty 50

## 2018-05-21 MED ORDER — GABAPENTIN 300 MG PO CAPS
300.0000 mg | ORAL_CAPSULE | ORAL | Status: AC
  Administered 2018-05-21: 300 mg via ORAL
  Filled 2018-05-21: qty 1

## 2018-05-21 MED ORDER — FENTANYL CITRATE (PF) 250 MCG/5ML IJ SOLN
INTRAMUSCULAR | Status: AC
  Filled 2018-05-21: qty 5

## 2018-05-21 MED ORDER — ORAL CARE MOUTH RINSE
15.0000 mL | Freq: Two times a day (BID) | OROMUCOSAL | Status: DC
  Administered 2018-05-21 – 2018-05-25 (×8): 15 mL via OROMUCOSAL

## 2018-05-21 MED ORDER — ROCURONIUM BROMIDE 10 MG/ML (PF) SYRINGE
PREFILLED_SYRINGE | INTRAVENOUS | Status: AC
  Filled 2018-05-21: qty 10

## 2018-05-21 MED ORDER — MEPERIDINE HCL 50 MG/ML IJ SOLN: 6.2500 mg | INTRAMUSCULAR | Status: DC | PRN

## 2018-05-21 MED ORDER — DEXAMETHASONE SODIUM PHOSPHATE 10 MG/ML IJ SOLN
INTRAMUSCULAR | Status: DC | PRN
  Administered 2018-05-21: 10 mg via INTRAVENOUS

## 2018-05-21 MED ORDER — ONDANSETRON HCL 4 MG PO TABS: 4.0000 mg | ORAL_TABLET | Freq: Four times a day (QID) | ORAL | Status: DC | PRN

## 2018-05-21 MED ORDER — ACETAMINOPHEN 500 MG PO TABS
1000.0000 mg | ORAL_TABLET | ORAL | Status: AC
  Administered 2018-05-21: 1000 mg via ORAL
  Filled 2018-05-21: qty 2

## 2018-05-21 MED ORDER — POLYETHYL GLYCOL-PROPYL GLYCOL 0.4-0.3 % OP SOLN: 1.0000 [drp] | Freq: Three times a day (TID) | OPHTHALMIC | Status: DC | PRN

## 2018-05-21 MED ORDER — FENTANYL CITRATE (PF) 100 MCG/2ML IJ SOLN
INTRAMUSCULAR | Status: DC | PRN
  Administered 2018-05-21 (×2): 50 ug via INTRAVENOUS

## 2018-05-21 MED ORDER — LIDOCAINE 2% (20 MG/ML) 5 ML SYRINGE
INTRAMUSCULAR | Status: DC | PRN
  Administered 2018-05-21: 100 mg via INTRAVENOUS

## 2018-05-21 MED ORDER — HYDROMORPHONE HCL 1 MG/ML IJ SOLN: 0.5000 mg | INTRAMUSCULAR | Status: DC | PRN

## 2018-05-21 MED ORDER — KETAMINE HCL 10 MG/ML IJ SOLN
INTRAMUSCULAR | Status: AC
  Filled 2018-05-21: qty 1

## 2018-05-21 MED ORDER — ONDANSETRON HCL 4 MG/2ML IJ SOLN
INTRAMUSCULAR | Status: DC | PRN
  Administered 2018-05-21: 4 mg via INTRAVENOUS

## 2018-05-21 MED ORDER — POLYVINYL ALCOHOL 1.4 % OP SOLN
1.0000 [drp] | OPHTHALMIC | Status: DC | PRN
  Filled 2018-05-21: qty 15

## 2018-05-21 MED ORDER — 0.9 % SODIUM CHLORIDE (POUR BTL) OPTIME
TOPICAL | Status: DC | PRN
  Administered 2018-05-21: 2000 mL

## 2018-05-21 MED ORDER — DIPHENHYDRAMINE HCL 50 MG/ML IJ SOLN: 12.5000 mg | Freq: Four times a day (QID) | INTRAMUSCULAR | Status: DC | PRN

## 2018-05-21 MED ORDER — ENOXAPARIN SODIUM 40 MG/0.4ML ~~LOC~~ SOLN
40.0000 mg | SUBCUTANEOUS | Status: DC
  Administered 2018-05-22 – 2018-05-23 (×2): 40 mg via SUBCUTANEOUS
  Filled 2018-05-21 (×2): qty 0.4

## 2018-05-21 MED ORDER — SACCHAROMYCES BOULARDII 250 MG PO CAPS
250.0000 mg | ORAL_CAPSULE | Freq: Two times a day (BID) | ORAL | Status: DC
  Administered 2018-05-21 – 2018-05-26 (×10): 250 mg via ORAL
  Filled 2018-05-21 (×10): qty 1

## 2018-05-21 MED ORDER — POTASSIUM CHLORIDE CRYS ER 20 MEQ PO TBCR
20.0000 meq | EXTENDED_RELEASE_TABLET | Freq: Every day | ORAL | Status: DC
  Administered 2018-05-21 – 2018-05-26 (×6): 20 meq via ORAL
  Filled 2018-05-21 (×6): qty 1

## 2018-05-21 MED ORDER — ENSURE SURGERY PO LIQD
237.0000 mL | Freq: Two times a day (BID) | ORAL | Status: DC
  Administered 2018-05-22 – 2018-05-25 (×7): 237 mL via ORAL
  Filled 2018-05-21 (×11): qty 237

## 2018-05-21 MED ORDER — HYDROCODONE-ACETAMINOPHEN 7.5-325 MG PO TABS: 1.0000 | ORAL_TABLET | Freq: Once | ORAL | Status: DC | PRN

## 2018-05-21 MED ORDER — LIDOCAINE 20MG/ML (2%) 15 ML SYRINGE OPTIME
INTRAMUSCULAR | Status: DC | PRN
  Administered 2018-05-21: 1.5 mg/kg/h via INTRAVENOUS

## 2018-05-21 MED ORDER — ROCURONIUM BROMIDE 50 MG/5ML IV SOSY
PREFILLED_SYRINGE | INTRAVENOUS | Status: DC | PRN
  Administered 2018-05-21: 50 mg via INTRAVENOUS

## 2018-05-21 MED ORDER — DIPHENHYDRAMINE HCL 12.5 MG/5ML PO ELIX: 12.5000 mg | ORAL_SOLUTION | Freq: Four times a day (QID) | ORAL | Status: DC | PRN

## 2018-05-21 MED ORDER — AMLODIPINE BESYLATE 10 MG PO TABS
10.0000 mg | ORAL_TABLET | Freq: Two times a day (BID) | ORAL | Status: DC
  Administered 2018-05-21 – 2018-05-26 (×10): 10 mg via ORAL
  Filled 2018-05-21 (×10): qty 1

## 2018-05-21 MED ORDER — ALBUTEROL SULFATE (5 MG/ML) 0.5% IN NEBU: 2.5000 mg | INHALATION_SOLUTION | Freq: Four times a day (QID) | RESPIRATORY_TRACT | Status: DC | PRN

## 2018-05-21 MED ORDER — SUGAMMADEX SODIUM 200 MG/2ML IV SOLN
INTRAVENOUS | Status: AC
  Filled 2018-05-21: qty 2

## 2018-05-21 MED ORDER — KCL IN DEXTROSE-NACL 20-5-0.45 MEQ/L-%-% IV SOLN
INTRAVENOUS | Status: DC
  Administered 2018-05-21: 14:00:00 via INTRAVENOUS
  Filled 2018-05-21 (×2): qty 1000

## 2018-05-21 MED ORDER — TRAMADOL HCL 50 MG PO TABS: 50.0000 mg | ORAL_TABLET | Freq: Four times a day (QID) | ORAL | Status: DC | PRN

## 2018-05-21 MED ORDER — LIDOCAINE 2% (20 MG/ML) 5 ML SYRINGE
INTRAMUSCULAR | Status: AC
  Filled 2018-05-21: qty 5

## 2018-05-21 MED ORDER — LIDOCAINE HCL 2 % IJ SOLN
INTRAMUSCULAR | Status: AC
  Filled 2018-05-21: qty 20

## 2018-05-21 MED ORDER — PROPOFOL 10 MG/ML IV BOLUS
INTRAVENOUS | Status: AC
  Filled 2018-05-21: qty 20

## 2018-05-21 MED ORDER — EPHEDRINE SULFATE-NACL 50-0.9 MG/10ML-% IV SOSY
PREFILLED_SYRINGE | INTRAVENOUS | Status: DC | PRN
  Administered 2018-05-21 (×3): 10 mg via INTRAVENOUS

## 2018-05-21 SURGICAL SUPPLY — 64 items
ADH SKN CLS APL DERMABOND .7 (GAUZE/BANDAGES/DRESSINGS) ×1
APPLIER CLIP 5 13 M/L LIGAMAX5 (MISCELLANEOUS)
APR CLP MED LRG 5 ANG JAW (MISCELLANEOUS)
BLADE EXTENDED COATED 6.5IN (ELECTRODE) IMPLANT
CABLE HIGH FREQUENCY MONO STRZ (ELECTRODE) ×3 IMPLANT
CELLS DAT CNTRL 66122 CELL SVR (MISCELLANEOUS) IMPLANT
CHLORAPREP W/TINT 26ML (MISCELLANEOUS) ×3 IMPLANT
CLIP APPLIE 5 13 M/L LIGAMAX5 (MISCELLANEOUS) IMPLANT
DECANTER SPIKE VIAL GLASS SM (MISCELLANEOUS) ×3 IMPLANT
DERMABOND ADVANCED (GAUZE/BANDAGES/DRESSINGS) ×2
DERMABOND ADVANCED .7 DNX12 (GAUZE/BANDAGES/DRESSINGS) ×1 IMPLANT
DRAPE LAPAROSCOPIC ABDOMINAL (DRAPES) ×3 IMPLANT
DRAPE SURG IRRIG POUCH 19X23 (DRAPES) ×3 IMPLANT
DRSG OPSITE POSTOP 4X6 (GAUZE/BANDAGES/DRESSINGS) ×4 IMPLANT
ELECT PENCIL ROCKER SW 15FT (MISCELLANEOUS) ×6 IMPLANT
ELECT REM PT RETURN 15FT ADLT (MISCELLANEOUS) ×3 IMPLANT
GLOVE BIO SURGEON STRL SZ 6.5 (GLOVE) ×4 IMPLANT
GLOVE BIO SURGEONS STRL SZ 6.5 (GLOVE) ×2
GLOVE BIOGEL PI IND STRL 7.0 (GLOVE) ×2 IMPLANT
GLOVE BIOGEL PI INDICATOR 7.0 (GLOVE) ×4
GOWN STRL REUS W/TWL 2XL LVL3 (GOWN DISPOSABLE) ×6 IMPLANT
GOWN STRL REUS W/TWL XL LVL3 (GOWN DISPOSABLE) ×12 IMPLANT
GRASPER ENDOPATH ANVIL 10MM (MISCELLANEOUS) IMPLANT
HOLDER FOLEY CATH W/STRAP (MISCELLANEOUS) ×3 IMPLANT
IRRIG SUCT STRYKERFLOW 2 WTIP (MISCELLANEOUS) ×3
IRRIGATION SUCT STRKRFLW 2 WTP (MISCELLANEOUS) ×1 IMPLANT
LUBRICANT JELLY K Y 4OZ (MISCELLANEOUS) ×3 IMPLANT
PACK COLON (CUSTOM PROCEDURE TRAY) ×3 IMPLANT
PAD POSITIONING PINK XL (MISCELLANEOUS) ×3 IMPLANT
PORT LAP GEL ALEXIS MED 5-9CM (MISCELLANEOUS) ×3 IMPLANT
POSITIONER SURGICAL ARM (MISCELLANEOUS) ×3 IMPLANT
RELOAD PROXIMATE 75MM BLUE (ENDOMECHANICALS) ×6 IMPLANT
RELOAD STAPLE 75 3.8 BLU REG (ENDOMECHANICALS) IMPLANT
RETRACTOR WND ALEXIS 18 MED (MISCELLANEOUS) IMPLANT
RTRCTR WOUND ALEXIS 18CM MED (MISCELLANEOUS)
SCISSORS LAP 5X35 DISP (ENDOMECHANICALS) ×3 IMPLANT
SLEEVE XCEL OPT CAN 5 100 (ENDOMECHANICALS) ×3 IMPLANT
SPONGE DRAIN TRACH 4X4 STRL 2S (GAUZE/BANDAGES/DRESSINGS) IMPLANT
SPONGE LAP 18X18 RF (DISPOSABLE) IMPLANT
STAPLER GUN LINEAR PROX 60 (STAPLE) ×2 IMPLANT
STAPLER PROXIMATE 75MM BLUE (STAPLE) ×2 IMPLANT
STAPLER VISISTAT 35W (STAPLE) IMPLANT
SUT ETHILON 2 0 PS N (SUTURE) IMPLANT
SUT NOVA NAB GS-21 0 18 T12 DT (SUTURE) ×6 IMPLANT
SUT PDS AB 1 CTX 36 (SUTURE) IMPLANT
SUT PDS AB 1 TP1 96 (SUTURE) IMPLANT
SUT PROLENE 2 0 KS (SUTURE) ×3 IMPLANT
SUT SILK 2 0 (SUTURE) ×3
SUT SILK 2 0 SH CR/8 (SUTURE) ×3 IMPLANT
SUT SILK 2-0 18XBRD TIE 12 (SUTURE) ×1 IMPLANT
SUT SILK 3 0 (SUTURE) ×3
SUT SILK 3 0 SH CR/8 (SUTURE) ×3 IMPLANT
SUT SILK 3-0 18XBRD TIE 12 (SUTURE) ×1 IMPLANT
SUT VIC AB 2-0 SH 18 (SUTURE) ×3 IMPLANT
SUT VIC AB 4-0 PS2 27 (SUTURE) ×3 IMPLANT
SYS LAPSCP GELPORT 120MM (MISCELLANEOUS)
SYSTEM LAPSCP GELPORT 120MM (MISCELLANEOUS) IMPLANT
TOWEL OR NON WOVEN STRL DISP B (DISPOSABLE) ×3 IMPLANT
TRAY FOLEY MTR SLVR 16FR STAT (SET/KITS/TRAYS/PACK) ×2 IMPLANT
TROCAR BLADELESS OPT 5 100 (ENDOMECHANICALS) ×3 IMPLANT
TROCAR XCEL BLUNT TIP 100MML (ENDOMECHANICALS) IMPLANT
TUBING CONNECTING 10 (TUBING) ×2 IMPLANT
TUBING CONNECTING 10' (TUBING) ×1
TUBING INSUF HEATED (TUBING) ×3 IMPLANT

## 2018-05-21 NOTE — H&P (Signed)
The patient is a 72 year old male who presents with a colonic polyp. 72 year old male who underwent a colonoscopy for anemia. Several polyps were removed during the colonoscopy. A large polyp was noted around the ileocecal valve that was biopsied. Biopsy returned as tubulovillous adenoma. CT scan does seem to demonstrate the mass in the cecum. Patient was also noted to have some liver nodularity. Patient has a history of coronary artery disease and atrial fibrillation. He also has an aortic aneurysm. He is not taking his anticoagulation currently.   Past Surgical History [name] [name]; 04/07/2018 9:33 AM) Cataract Surgery  Left. Spinal Surgery - Lower Back   Diagnostic Studies History [name] [name]; 04/07/2018 9:33 AM) Colonoscopy  within last year  Allergies [name] [name]; 04/07/2018 9:36 AM) Penicillins  Anaphylaxis. Amoxicillin *PENICILLINS*  Anaphylaxis. Allergies Reconciled   Medication History [name] [name]; 04/07/2018 9:38 AM) Klor-Con M20 [facility] Tablet ER, Oral) Active. ProAir HFA (108 (90 Base)MCG/ACT Aerosol Soln, Inhalation) Active. AmLODIPine Besylate (10MG  Tablet, Oral) Active. Omega 3 (Oral) Specific strength unknown - Active. Medications Reconciled  Social History [name] [name]; 04/07/2018 9:33 AM) Alcohol use  Moderate alcohol use. Caffeine use  Carbonated beverages, Coffee, Tea. No drug use  Tobacco use  Former smoker.  Family History [name] [name]; 04/07/2018 9:33 AM) Alcohol Abuse  Brother. Arthritis  Sister. Breast Cancer  Sister. Cerebrovascular Accident  Family Members In General, Mother. Depression  Family Members In General. Diabetes Mellitus  Mother. Hypertension  Brother, Daughter, Father, Mother, Sister, Son. Kidney Disease  Family Members In General. Migraine Headache  Daughter, Sister. Prostate Cancer  Family Members In General. Respiratory Condition  Son. Thyroid problems   Daughter.  Other Problems  Asthma  Back Pain  Bladder Problems  Enlarged Prostate  Gastroesophageal Reflux Disease  High blood pressure  Hypercholesterolemia  Sleep Apnea     Review of Systems General Not Present- Appetite Loss, Chills, Fatigue, Fever, Night Sweats, Weight Gain and Weight Loss. Skin Not Present- Change in Wart/Mole, Dryness, Hives, Jaundice, New Lesions, Non-Healing Wounds, Rash and Ulcer. HEENT Present- Ringing in the Ears. Not Present- Earache, Hearing Loss, Hoarseness, Nose Bleed, Oral Ulcers, Seasonal Allergies, Sinus Pain, Sore Throat, Visual Disturbances, Wears glasses/contact lenses and Yellow Eyes. Respiratory Not Present- Bloody sputum, Chronic Cough, Difficulty Breathing, Snoring and Wheezing. Breast Not Present- Breast Mass, Breast Pain, Nipple Discharge and Skin Changes. Cardiovascular Not Present- Chest Pain, Difficulty Breathing Lying Down, Leg Cramps, Palpitations, Rapid Heart Rate, Shortness of Breath and Swelling of Extremities. Gastrointestinal Not Present- Abdominal Pain, Bloating, Bloody Stool, Change in Bowel Habits, Chronic diarrhea, Constipation, Difficulty Swallowing, Excessive gas, Gets full quickly at meals, Hemorrhoids, Indigestion, Nausea, Rectal Pain and Vomiting. Male Genitourinary Present- Frequency, Nocturia, Urgency and Urine Leakage. Not Present- Blood in Urine, Change in Urinary Stream, Impotence and Painful Urination. Musculoskeletal Not Present- Back Pain, Joint Pain, Joint Stiffness, Muscle Pain, Muscle Weakness and Swelling of Extremities. Neurological Not Present- Decreased Memory, Fainting, Headaches, Numbness, Seizures, Tingling, Tremor, Trouble walking and Weakness. Psychiatric Not Present- Anxiety, Bipolar, Change in Sleep Pattern, Depression, Fearful and Frequent crying. Endocrine Not Present- Cold Intolerance, Excessive Hunger, Hair Changes, Heat Intolerance, Hot flashes and New Diabetes. Hematology Not Present-  Blood Thinners, Easy Bruising, Excessive bleeding, Gland problems, HIV and Persistent Infections.  Vitals [name] [name]; 04/07/2018 9:34 AM) BP (!) 141/89   Pulse 65   Temp 98.5 F (36.9 C) (Oral)   Resp 16   Ht 5\' 6"  (1.676 m)   SpO2 100%   BMI 35.51 kg/m  Physical Exam  General Mental Status-Alert. General Appearance-Not in acute distress. Build & Nutrition-Well nourished. Posture-Normal posture. Gait-Normal.  Head and Neck Head-normocephalic, atraumatic with no lesions or palpable masses. Trachea-midline.  Chest and Lung Exam Chest and lung exam reveals -on auscultation, normal breath sounds, no adventitious sounds and normal vocal resonance.  Cardiovascular Cardiovascular examination reveals -normal heart sounds, regular rate and rhythm with no murmurs and no digital clubbing, cyanosis, edema, increased warmth or tenderness.  Abdomen Inspection Inspection of the abdomen reveals - No Hernias. Palpation/Percussion Palpation and Percussion of the abdomen reveal - Soft, Non Tender, No Rigidity (guarding), No hepatosplenomegaly and No Palpable abdominal masses.  Neurologic Neurologic evaluation reveals -alert and oriented x 3 with no impairment of recent or remote memory, normal attention span and ability to concentrate, normal sensation and normal coordination.  Musculoskeletal Normal Exam - Bilateral-Upper Extremity Strength Normal and Lower Extremity Strength Normal.    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 Biopsies show adenoma. Patient has significant cardiac history and a history of an aortic aneurysm. We will get cardiac clearance prior to scheduling surgery. I will get lab work today to evaluate his liver and kidney function as well as a  baseline CEA. The surgery and anatomy were described to the patient as well as the risks of surgery and the possible complications.  These include: Bleeding, deep abdominal infections and possible wound complications such as hernia and infection, damage to adjacent structures, leak of surgical connections, which can lead to other surgeries and possibly an ostomy, possible need for other procedures, such as abscess drains in radiology, possible prolonged hospital stay, possible diarrhea from removal of part of the colon, possible constipation from narcotics, prolonged fatigue/weakness or appetite loss, possible early recurrence of of disease, possible complications of their medical problems such as heart disease or arrhythmias or lung problems, death (less than 1%). I believe the patient understands and wishes to proceed with the surgery.

## 2018-05-21 NOTE — Anesthesia Postprocedure Evaluation (Signed)
Anesthesia Post Note  Patient: [name] [name]  Procedure(s) Performed: LAPAROSCOPIC RIGHT  COLECTOMY ERAS PATHWAY (Right )     Patient location during evaluation: PACU Anesthesia Type: General Level of consciousness: awake and alert Pain management: pain level controlled Vital Signs Assessment: post-procedure vital signs reviewed and stable Respiratory status: spontaneous breathing, nonlabored ventilation, respiratory function stable and patient connected to nasal cannula oxygen Cardiovascular status: blood pressure returned to baseline and stable Postop Assessment: no apparent nausea or vomiting Anesthetic complications: no    Last Vitals:  Vitals:   05/21/18 1115 05/21/18 1130  BP:  136/83  Pulse: (!) 57 (!) 56  Resp: 18 15  Temp:  36.5 C  SpO2: 99% 100%    Last Pain:  Vitals:   05/21/18 1130  TempSrc:   PainSc: 0-No pain                 Barnet Glasgow

## 2018-05-21 NOTE — Progress Notes (Signed)
I have reviewed and concur with this student's documentation.   

## 2018-05-21 NOTE — Anesthesia Procedure Notes (Signed)
Procedure Name: Intubation Date/Time: 05/21/2018 7:42 AM Performed by: Genelle Bal, CRNA Pre-anesthesia Checklist: Patient identified, Emergency Drugs available, Suction available and Patient being monitored Patient Re-evaluated:Patient Re-evaluated prior to induction Oxygen Delivery Method: Circle system utilized Preoxygenation: Pre-oxygenation with 100% oxygen Induction Type: IV induction Ventilation: Mask ventilation without difficulty and Oral airway inserted - appropriate to patient size Laryngoscope Size: 2 Grade View: Grade I Tube type: Oral Tube size: 7.5 mm Number of attempts: 1 Airway Equipment and Method: Stylet and Oral airway Placement Confirmation: ETT inserted through vocal cords under direct vision,  positive ETCO2 and breath sounds checked- equal and bilateral Secured at: 23 cm Tube secured with: Tape Dental Injury: Teeth and Oropharynx as per pre-operative assessment

## 2018-05-21 NOTE — Op Note (Signed)
05/21/2018  9:12 AM  PATIENT:  [name] [name]  72 y.o. male  Patient Care Team: Nolene Ebbs, MD as PCP - General (Internal Medicine) Minus Breeding, MD as PCP - Cardiology (Cardiology)  PRE-OPERATIVE DIAGNOSIS:  colon polyp  POST-OPERATIVE DIAGNOSIS:  colon polyp  PROCEDURE:  LAPAROSCOPIC RIGHT  COLECTOMY    Surgeon(s): Leighton Ruff, MD  ASSISTANT: none   ANESTHESIA:   local and general  EBL: 21ml Total I/O In: 1000 [I.V.:1000] Out: 220 [Urine:200; Blood:20]  SPECIMEN:  Source of Specimen:  R colon  DISPOSITION OF SPECIMEN:  PATHOLOGY  COUNTS:  YES  PLAN OF CARE: Admit to inpatient   PATIENT DISPOSITION:  PACU - hemodynamically stable.   INDICATIONS: This is a 72 y.o. male who presented to my office with endoscopically unresectable cecal polyp. The risk and benefits and alternative treatments were explained to the patient prior to the OR and the patient has elected to proceed with R colectomy.  Consent was signed and placed on chart prior to the OR.   OR FINDINGS: Colon polyp, nodular liver  DESCRIPTION:  The patient was identified & brought into the operating room. The patient was positioned supine with arms tucked. SCDs were active during the entire case. The patient underwent general anesthesia without any difficulty. A foley catheter was inserted under sterile conditions. The abdomen was prepped and draped in a sterile fashion. A Surgical Timeout confirmed our plan.  I made an incision above the umbilical fold. I dissected down through the subcutaneous tissues using cautery.  The fascia was divided with cautery.  Blunt dissection was used to obtain peritoneal entry.  An alexis wound protector was placed and the cap was placed over this.  The abdomen was insufflated to ~15 mmHg.  Camera inspection revealed no injury.  I placed additional ports under direct laparoscopic visualization.  I evaluated the entire abdomen laparoscopically.  The liver appeared nodule, the  large and small bowel were normal as well.  There were no signs of metastatic disease.  A laparoscopic TAPP block was performed with Experel.     I began by identifying the ileocolic artery and vein within the mesentery. Dissection was bluntly carried around these structures. The duodenum was identified and free from the structures. I then separated the structures bluntly and used the Enseal device to transect these separately.  I developed the retroperitoneal plane bluntly.  I then freed the appendix off its attachments to the pelvic wall. I mobilized the terminal ileum.  I took care to avoid injuring any retroperitoneal structures.  After this I began to mobilize laterally down the white line of Toldt and then took down the hepatic flexure using the Enseal device. I mobilized the omentum off of the right transverse colon. The entire colon was then flipped medially and mobilized off of the retroperitoneal structures until I could visualize the lateral edge of the duodenum underneath.  I gently freed the duodenal attachments.  At that point, I desufflated the abdomen and removed the wound protector cap.  The terminal ileum and right colon were then removed from the wound. The terminal ileum was transected using a GIA blue load stapler. The remaining mesentery was divided using the Enseal device. I identified a portion of the transverse colon just distal to the hepatic flexure. This was transected using another blue load GIA stapler.  An anastomosis was created between the terminal ileum and the transverse colon. This was done using a GIA blue load stapler.  The common enterotomy channel  was closed using a TA 60 blue load stapler. Hemostasis was good at the staple line. Several 3-0 silk sutures were used to imbricate the edge of the anastomosis. An anti-tension suture was placed in the crotch of the anastomosis. This was then placed back into the abdomen. The abdomen was then irrigated with normal saline. The  omentum was then brought down over the anastomosis. The Alexis wound protector was removed, and we switched to clean instruments, gowns and drapes.  The fascia was then closed using #0 Novafil interrupted sutures.  The subcutaneous tissue of the extraction incision was closed using interrupted 2-0 Vicryl sutures. The skin was then closed using 4-0 Vicryl sutures. Dermabond was placed on the port sites and a sterile dressing was placed over the abdominal incision. All counts were correct per operating room staff. The patient was then awakened from anesthesia and sent to the post anesthesia care unit in stable condition.

## 2018-05-21 NOTE — Transfer of Care (Signed)
Immediate Anesthesia Transfer of Care Note  Patient: [name] [name]  Procedure(s) Performed: LAPAROSCOPIC RIGHT  COLECTOMY ERAS PATHWAY (Right )  Patient Location: PACU  Anesthesia Type:General  Level of Consciousness: drowsy and patient cooperative  Airway & Oxygen Therapy: Patient Spontanous Breathing and Patient connected to face mask oxygen  Post-op Assessment: Report given to RN and Post -op Vital signs reviewed and stable  Post vital signs: Reviewed and stable  Last Vitals:  Vitals Value Taken Time  BP 131/77 05/21/2018  9:36 AM  Temp 36.6 C 05/21/2018  9:38 AM  Pulse 60 05/21/2018  9:38 AM  Resp 12 05/21/2018  9:38 AM  SpO2 100 % 05/21/2018  9:38 AM  Vitals shown include unvalidated device data.  Last Pain:  Vitals:   05/21/18 0549  TempSrc: Oral  PainSc:          Complications: No apparent anesthesia complications

## 2018-05-22 ENCOUNTER — Encounter (HOSPITAL_COMMUNITY): Payer: Self-pay | Admitting: General Surgery

## 2018-05-22 LAB — BASIC METABOLIC PANEL
Anion gap: 11 (ref 5–15)
BUN: 26 mg/dL — ABNORMAL HIGH (ref 8–23)
CHLORIDE: 104 mmol/L (ref 98–111)
CO2: 26 mmol/L (ref 22–32)
Calcium: 9.1 mg/dL (ref 8.9–10.3)
Creatinine, Ser: 2.09 mg/dL — ABNORMAL HIGH (ref 0.61–1.24)
GFR calc non Af Amer: 30 mL/min — ABNORMAL LOW (ref >60)
GFR, EST AFRICAN AMERICAN: 34 mL/min — AB (ref >60)
Glucose, Bld: 115 mg/dL — ABNORMAL HIGH (ref 70–99)
POTASSIUM: 3.4 mmol/L — AB (ref 3.5–5.1)
SODIUM: 141 mmol/L (ref 135–145)

## 2018-05-22 LAB — CBC
HCT: 29.9 % — ABNORMAL LOW (ref 39.0–52.0)
HEMOGLOBIN: 10.1 g/dL — AB (ref 13.0–17.0)
MCH: 32 pg (ref 26.0–34.0)
MCHC: 33.8 g/dL (ref 30.0–36.0)
MCV: 94.6 fL (ref 78.0–100.0)
Platelets: 236 10*3/uL (ref 150–400)
RBC: 3.16 MIL/uL — AB (ref 4.22–5.81)
RDW: 13.2 % (ref 11.5–15.5)
WBC: 11 10*3/uL — AB (ref 4.0–10.5)

## 2018-05-22 MED ORDER — ACETAMINOPHEN 500 MG PO TABS
1000.0000 mg | ORAL_TABLET | Freq: Four times a day (QID) | ORAL | Status: DC
  Administered 2018-05-22 – 2018-05-26 (×15): 1000 mg via ORAL
  Filled 2018-05-22 (×15): qty 2

## 2018-05-22 NOTE — Progress Notes (Signed)
ERAS education reinforced. Did patient attend class prior to procedure? Yes [  x ] No [   ] Discussed: Pain Control [ x  ] Mobility [  x ] Diet [  x ] Other [   ]  Doing well. Reports pain is controlled. Tolerating regular diet. Pecolia Ades, RN, BSN Quality Program Coordinator, Enhanced Recovery after Surgery 05/22/18 2:32 PM

## 2018-05-22 NOTE — Progress Notes (Signed)
1 Day Post-Op lap R colectomy Subjective: Pt doing well.  Having some mild soreness only.  Tolerating clears.  Having flatus and BM's  Objective: Vital signs in last 24 hours: Temp:  [97.5 F (36.4 C)-98.2 F (36.8 C)] 98.2 F (36.8 C) (09/20 0458) Pulse Rate:  [53-58] 54 (09/20 0458) Resp:  [12-18] 18 (09/20 0458) BP: (124-147)/(75-94) 134/94 (09/20 0827) SpO2:  [95 %-100 %] 96 % (09/20 0458) FiO2 (%):  [2 %] 2 % (09/19 1220) Weight:  [98.9 kg-105.3 kg] 105.3 kg (09/20 0500)   Intake/Output from previous day: 09/19 0701 - 09/20 0700 In: 1699.2 [P.O.:360; I.V.:1289.2; IV Piggyback:50] Out: 3070 [Urine:3050; Blood:20] Intake/Output this shift: Total I/O In: 120 [P.O.:120] Out: 150 [Urine:150]   General appearance: alert and cooperative GI: normal findings: soft, mildly distended  Incision: slight bloody drainage  Lab Results:  Recent Labs    05/22/18 0454  WBC 11.0*  HGB 10.1*  HCT 29.9*  PLT 236   BMET Recent Labs    05/22/18 0454  NA 141  K 3.4*  CL 104  CO2 26  GLUCOSE 115*  BUN 26*  CREATININE 2.09*  CALCIUM 9.1   PT/INR No results for input(s): LABPROT, INR in the last 72 hours. ABG No results for input(s): PHART, HCO3 in the last 72 hours.  Invalid input(s): PCO2, PO2  MEDS, Scheduled . amLODipine  10 mg Oral BID  . enoxaparin (LOVENOX) injection  40 mg Subcutaneous Q24H  . feeding supplement  237 mL Oral BID BM  . gabapentin  300 mg Oral BID  . mouth rinse  15 mL Mouth Rinse BID  . potassium chloride SA  20 mEq Oral Daily  . saccharomyces boulardii  250 mg Oral BID    Studies/Results: No results found.  Assessment: s/p Procedure(s): LAPAROSCOPIC RIGHT  COLECTOMY ERAS PATHWAY Patient Active Problem List   Diagnosis Date Noted  . Colon polyp 05/21/2018  . Aortic valve regurgitation 04/14/2018  . Medication management 04/14/2018  . Thoracic aortic aneurysm without rupture ([place]) 09/29/2017  . 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 Head trauma, subsequent encounter 10/12/2016  . 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 Spinal stenosis in cervical region 10/12/2016  . Cervical spondylosis 10/12/2016  . Syncope and collapse 10/12/2016  . Fall   . Syncope 10/11/2016  . CAD (coronary artery disease)   . Chronic diastolic CHF (congestive heart failure) ([place])   . Hyperlipidemia   . Hypertensive heart disease   . Paroxysmal atrial fibrillation ([place]) 12/17/2015  . Multiple myeloma ([place]) 12/17/2015  . Mild intermittent asthma 10/18/2015  . Aortic root dilatation ([place]) 10/17/2015  . Diabetes ([place]) 10/17/2015  . 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 Hand paresthesia 12/09/2014  . Elevated prostate specific antigen (PSA) 07/12/2014  . Allergic rhinitis 07/12/2014  . LBP (low back pain) 07/12/2014  . Palpitation 07/11/2014  . Patient's other noncompliance with medication regimen 08/10/2013  . Chronic venous insufficiency 07/23/2012  . Obstructive apnea 04/11/2011  . Essential (primary) hypertension 09/07/2010  . 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 Anxiety, generalized 04/04/2010  . Cardiac conduction disorder 02/27/2010  . Benign prostatic hypertrophy without urinary obstruction 09/13/2009  . Abnormal findings on examination of genitourinary organs 08/09/2009  . Hereditary and idiopathic neuropathy 07/05/2009    Expected post op course  Plan: d/c foley Advance diet  Ambulate    LOS: 1 day     .  Rosario Adie, [facility], [place]   05/22/2018 10:38 AM

## 2018-05-23 LAB — CBC
HEMATOCRIT: 30.4 % — AB (ref 39.0–52.0)
Hemoglobin: 10.2 g/dL — ABNORMAL LOW (ref 13.0–17.0)
MCH: 32.4 pg (ref 26.0–34.0)
MCHC: 33.6 g/dL (ref 30.0–36.0)
MCV: 96.5 fL (ref 78.0–100.0)
Platelets: 240 10*3/uL (ref 150–400)
RBC: 3.15 MIL/uL — AB (ref 4.22–5.81)
RDW: 13.7 % (ref 11.5–15.5)
WBC: 8.5 10*3/uL (ref 4.0–10.5)

## 2018-05-23 LAB — BASIC METABOLIC PANEL
ANION GAP: 10 (ref 5–15)
BUN: 35 mg/dL — ABNORMAL HIGH (ref 8–23)
CO2: 28 mmol/L (ref 22–32)
Calcium: 8.7 mg/dL — ABNORMAL LOW (ref 8.9–10.3)
Chloride: 103 mmol/L (ref 98–111)
Creatinine, Ser: 2.71 mg/dL — ABNORMAL HIGH (ref 0.61–1.24)
GFR calc Af Amer: 25 mL/min — ABNORMAL LOW (ref >60)
GFR, EST NON AFRICAN AMERICAN: 22 mL/min — AB (ref >60)
GLUCOSE: 96 mg/dL (ref 70–99)
Potassium: 3.3 mmol/L — ABNORMAL LOW (ref 3.5–5.1)
Sodium: 141 mmol/L (ref 135–145)

## 2018-05-23 MED ORDER — ENOXAPARIN SODIUM 30 MG/0.3ML ~~LOC~~ SOLN
30.0000 mg | SUBCUTANEOUS | Status: DC
  Administered 2018-05-24 – 2018-05-26 (×3): 30 mg via SUBCUTANEOUS
  Filled 2018-05-23 (×3): qty 0.3

## 2018-05-23 NOTE — Progress Notes (Signed)
Patient ID: [name] [name], male   DOB: [birth year], 72 y.o.   MRN: [id number] 2 Days Post-Op   Subjective: Doing pretty well this morning.  Mild pain, mainly "soreness", and a little weak but feeling gradually better.  Tolerating diet without nausea.  Has passed some old blood.  Objective: Vital signs in last 24 hours: Temp:  [98.2 F (36.8 C)] 98.2 F (36.8 C) (09/21 0456) Pulse Rate:  [53-60] 53 (09/21 0456) Resp:  [16-20] 20 (09/21 0456) BP: (130-139)/(77-84) 130/83 (09/21 0456) SpO2:  [97 %-99 %] 97 % (09/21 0456) Weight:  [104.4 kg] 104.4 kg (09/21 0500) Last BM Date: 05/23/18  Intake/Output from previous day: 09/20 0701 - 09/21 0700 In: 5139.7 [P.O.:1080; I.V.:4059.7] Out: 1200 [Urine:1200] Intake/Output this shift: Total I/O In: -  Out: 251 [Urine:250; Stool:1]  General appearance: alert, cooperative and no distress GI: Mild appropriate tenderness, nondistended Incision/Wound: No erythema or drainage  Lab Results:  Recent Labs    05/22/18 0454 05/23/18 0428  WBC 11.0* 8.5  HGB 10.1* 10.2*  HCT 29.9* 30.4*  PLT 236 240   BMET Recent Labs    05/22/18 0454 05/23/18 0428  NA 141 141  K 3.4* 3.3*  CL 104 103  CO2 26 28  GLUCOSE 115* 96  BUN 26* 35*  CREATININE 2.09* 2.71*  CALCIUM 9.1 8.7*     Studies/Results: No results found.  Anti-infectives: Anti-infectives (From admission, onward)   Start     Dose/Rate Route Frequency Ordered Stop   05/21/18 1600  clindamycin (CLEOCIN) IVPB 900 mg     900 mg 100 mL/hr over 30 Minutes Intravenous Every 8 hours 05/21/18 1219 05/21/18 1755   05/21/18 0600  gentamicin (GARAMYCIN) 400 mg in dextrose 5 % 100 mL IVPB     400 mg 110 mL/hr over 60 Minutes Intravenous 60 min pre-op 05/20/18 1305 05/21/18 0830   05/20/18 1305  clindamycin (CLEOCIN) IVPB 900 mg     900 mg 100 mL/hr over 30 Minutes Intravenous 60 min pre-op 05/20/18 1305 05/21/18 0830      Assessment/Plan: s/p Procedure(s): LAPAROSCOPIC RIGHT   COLECTOMY ERAS PATHWAY Overall appears stable postoperatively with a benign abdomen. Chronic renal insufficiency, some worsening of BUN and creatinine postoperatively.  Urine output appears adequate.  I will increase hydration and recheck bmet tomorrow morning.    LOS: 2 days    Edward Jolly 05/23/2018

## 2018-05-23 NOTE — Progress Notes (Addendum)
Patient reported "Bloody stool" after going to the bathroom. Observed brown liquid stool with a small amount of red tinged. Will continue to monitor and anticipate H&H results this morning. Patient currently not symptomatic for any blood loss.

## 2018-05-24 LAB — BASIC METABOLIC PANEL
ANION GAP: 9 (ref 5–15)
BUN: 37 mg/dL — AB (ref 8–23)
CALCIUM: 8.7 mg/dL — AB (ref 8.9–10.3)
CO2: 29 mmol/L (ref 22–32)
Chloride: 103 mmol/L (ref 98–111)
Creatinine, Ser: 2.86 mg/dL — ABNORMAL HIGH (ref 0.61–1.24)
GFR calc Af Amer: 23 mL/min — ABNORMAL LOW (ref >60)
GFR calc non Af Amer: 20 mL/min — ABNORMAL LOW (ref >60)
GLUCOSE: 94 mg/dL (ref 70–99)
POTASSIUM: 3.6 mmol/L (ref 3.5–5.1)
Sodium: 141 mmol/L (ref 135–145)

## 2018-05-24 LAB — CBC
HEMATOCRIT: 30.5 % — AB (ref 39.0–52.0)
HEMOGLOBIN: 10 g/dL — AB (ref 13.0–17.0)
MCH: 31.5 pg (ref 26.0–34.0)
MCHC: 32.8 g/dL (ref 30.0–36.0)
MCV: 96.2 fL (ref 78.0–100.0)
Platelets: 224 10*3/uL (ref 150–400)
RBC: 3.17 MIL/uL — ABNORMAL LOW (ref 4.22–5.81)
RDW: 13.4 % (ref 11.5–15.5)
WBC: 6.7 10*3/uL (ref 4.0–10.5)

## 2018-05-24 MED ORDER — LORATADINE 10 MG PO TABS
10.0000 mg | ORAL_TABLET | Freq: Every day | ORAL | Status: DC
  Administered 2018-05-24 – 2018-05-26 (×3): 10 mg via ORAL
  Filled 2018-05-24 (×3): qty 1

## 2018-05-24 NOTE — Progress Notes (Signed)
Dr. Excell Seltzer aware via phone of recent difficult craterization with resistance to advance past the prostate. Bladder scanned with 371 ml urine noted following spontaneous void. See new order to place coude cath.

## 2018-05-24 NOTE — Progress Notes (Signed)
Patient ID: [name] [name], male   DOB: 26-Nov-1945, 72 y.o.   MRN: [id number] 3 Days Post-Op   Subjective: Concerned about some blood in his stools.  Last BM has a small amount of old blood.  Generally feels okay.  Tolerating diet.  Voiding somewhat frequently.  Objective: Vital signs in last 24 hours: Temp:  [98.4 F (36.9 C)-98.6 F (37 C)] 98.4 F (36.9 C) (09/22 0648) Pulse Rate:  [62-74] 74 (09/22 0648) Resp:  [18] 18 (09/22 0648) BP: (136-199)/(74-111) 140/92 (09/22 0659) SpO2:  [97 %-98 %] 97 % (09/22 0648) Weight:  [106.5 kg] 106.5 kg (09/22 0610) Last BM Date: 05/23/18  Intake/Output from previous day: 09/21 0701 - 09/22 0700 In: 3611.3 [P.O.:1320; I.V.:2291.3] Out: 7078 [Urine:3100; Stool:2] Intake/Output this shift: No intake/output data recorded.  General appearance: alert, cooperative and no distress GI: normal findings: soft, non-tender and Undistended Incision/Wound: No erythema or drainage  Lab Results:  Recent Labs    05/23/18 0428 05/24/18 0443  WBC 8.5 6.7  HGB 10.2* 10.0*  HCT 30.4* 30.5*  PLT 240 224   BMET Recent Labs    05/23/18 0428 05/24/18 0443  NA 141 141  K 3.3* 3.6  CL 103 103  CO2 28 29  GLUCOSE 96 94  BUN 35* 37*  CREATININE 2.71* 2.86*  CALCIUM 8.7* 8.7*     Studies/Results: No results found.  Anti-infectives: Anti-infectives (From admission, onward)   Start     Dose/Rate Route Frequency Ordered Stop   05/21/18 1600  clindamycin (CLEOCIN) IVPB 900 mg     900 mg 100 mL/hr over 30 Minutes Intravenous Every 8 hours 05/21/18 1219 05/21/18 1755   05/21/18 0600  gentamicin (GARAMYCIN) 400 mg in dextrose 5 % 100 mL IVPB     400 mg 110 mL/hr over 60 Minutes Intravenous 60 min pre-op 05/20/18 1305 05/21/18 0830   05/20/18 1305  clindamycin (CLEOCIN) IVPB 900 mg     900 mg 100 mL/hr over 30 Minutes Intravenous 60 min pre-op 05/20/18 1305 05/21/18 0830      Assessment/Plan: s/p Procedure(s): LAPAROSCOPIC RIGHT  COLECTOMY  ERAS PATHWAY Generally doing well.  However some increase in BUN/creatinine.  Voiding frequently, question obstructive uropathy?  Will check postvoid residual.  Observe today and repeat be met in a.m.    LOS: 3 days    Edward Jolly 05/24/2018

## 2018-05-25 LAB — BASIC METABOLIC PANEL
Anion gap: 9 (ref 5–15)
BUN: 34 mg/dL — AB (ref 8–23)
CALCIUM: 8.7 mg/dL — AB (ref 8.9–10.3)
CO2: 28 mmol/L (ref 22–32)
Chloride: 102 mmol/L (ref 98–111)
Creatinine, Ser: 2.67 mg/dL — ABNORMAL HIGH (ref 0.61–1.24)
GFR, EST AFRICAN AMERICAN: 25 mL/min — AB (ref >60)
GFR, EST NON AFRICAN AMERICAN: 22 mL/min — AB (ref >60)
Glucose, Bld: 102 mg/dL — ABNORMAL HIGH (ref 70–99)
Potassium: 3.4 mmol/L — ABNORMAL LOW (ref 3.5–5.1)
SODIUM: 139 mmol/L (ref 135–145)

## 2018-05-25 NOTE — Progress Notes (Signed)
Patient ID: [name] [name], male   DOB: [birth year], 72 y.o.   MRN: [id number] 4 Days Post-Op   Subjective: Having BM's and tolerating a diet.  Pain controlled.  Foley reinserted yesterday with 4 L UOP.    Objective: Vital signs in last 24 hours: Temp:  [98.2 F (36.8 C)-98.3 F (36.8 C)] 98.3 F (36.8 C) (09/23 0451) Pulse Rate:  [60-66] 60 (09/23 0451) Resp:  [18] 18 (09/23 0451) BP: (144-158)/(87-91) 145/87 (09/23 0451) SpO2:  [95 %-98 %] 95 % (09/23 0451) Weight:  [938 kg] 109 kg (09/23 0700) Last BM Date: 05/25/18  Intake/Output from previous day: 09/22 0701 - 09/23 0700 In: 2415.3 [P.O.:820; I.V.:1595.3] Out: 3880 [Urine:3880] Intake/Output this shift: Total I/O In: 240 [P.O.:240] Out: -   General appearance: alert, cooperative and no distress GI: normal findings: soft, non-tender and Undistended Incision/Wound: No erythema or drainage  Lab Results:  Recent Labs    05/23/18 0428 05/24/18 0443  WBC 8.5 6.7  HGB 10.2* 10.0*  HCT 30.4* 30.5*  PLT 240 224   BMET Recent Labs    05/24/18 0443 05/25/18 0448  NA 141 139  K 3.6 3.4*  CL 103 102  CO2 29 28  GLUCOSE 94 102*  BUN 37* 34*  CREATININE 2.86* 2.67*  CALCIUM 8.7* 8.7*     Studies/Results: No results found.  Anti-infectives: Anti-infectives (From admission, onward)   Start     Dose/Rate Route Frequency Ordered Stop   05/21/18 1600  clindamycin (CLEOCIN) IVPB 900 mg     900 mg 100 mL/hr over 30 Minutes Intravenous Every 8 hours 05/21/18 1219 05/21/18 1755   05/21/18 0600  gentamicin (GARAMYCIN) 400 mg in dextrose 5 % 100 mL IVPB     400 mg 110 mL/hr over 60 Minutes Intravenous 60 min pre-op 05/20/18 1305 05/21/18 0830   05/20/18 1305  clindamycin (CLEOCIN) IVPB 900 mg     900 mg 100 mL/hr over 30 Minutes Intravenous 60 min pre-op 05/20/18 1305 05/21/18 0830      Assessment/Plan: s/p Procedure(s): LAPAROSCOPIC RIGHT  COLECTOMY ERAS PATHWAY Generally doing well.  BUN/creatinine slightly  better after foley reinsertion.  question obstructive uropathy?  Will cont foley and IVF's.  Will get renal US.  Possible d/c tom if Cr continues to decrease.      LOS: 4 days    Pennye Beeghly C. 1/82/9937

## 2018-05-26 LAB — BASIC METABOLIC PANEL
Anion gap: 9 (ref 5–15)
BUN: 35 mg/dL — ABNORMAL HIGH (ref 8–23)
CALCIUM: 8.8 mg/dL — AB (ref 8.9–10.3)
CO2: 28 mmol/L (ref 22–32)
CREATININE: 2.51 mg/dL — AB (ref 0.61–1.24)
Chloride: 102 mmol/L (ref 98–111)
GFR calc Af Amer: 27 mL/min — ABNORMAL LOW (ref >60)
GFR, EST NON AFRICAN AMERICAN: 24 mL/min — AB (ref >60)
Glucose, Bld: 97 mg/dL (ref 70–99)
Potassium: 3.5 mmol/L (ref 3.5–5.1)
Sodium: 139 mmol/L (ref 135–145)

## 2018-05-26 MED ORDER — TAMSULOSIN HCL 0.4 MG PO CAPS: 0.4000 mg | ORAL_CAPSULE | Freq: Every day | ORAL | 0 refills | Status: DC

## 2018-05-26 MED ORDER — TAMSULOSIN HCL 0.4 MG PO CAPS
0.4000 mg | ORAL_CAPSULE | Freq: Every day | ORAL | Status: DC
  Administered 2018-05-26: 0.4 mg via ORAL
  Filled 2018-05-26: qty 1

## 2018-05-26 NOTE — Progress Notes (Signed)
Discharge instructions discussed with patient, verbalized agreement and understanding, instructed on foley care, and use of leg bag

## 2018-05-26 NOTE — Progress Notes (Signed)
Successful removal of NSL IV to L posterior hand. Catheter intact. Pt tolerated well.

## 2018-05-26 NOTE — Discharge Summary (Signed)
Physician Discharge Summary  Patient ID: [name] [name] MRN: [id number] DOB/AGE: 06-Dec-1945 73 y.o.  Admit date: 05/21/2018 Discharge date: 05/26/2018  Admission Diagnoses: Colon polyp  Discharge Diagnoses:  Principal Problem:   Colon Cancer   Discharged Condition: good  Hospital Course: patient was admitted after laparoscopic right colectomy.  His diet was advanced as tolerated.  Over the next few days his creatinine began to rise.  This was felt to be due to obstructive uropathy.  A Foley catheter was replaced.  A Coude catheter had to be used due to prostatic hypermegaly.   After this placement, his creatinine began to trend down.  I discussed the patient with the urologist on call.  It was recommended that he be started on Flomax and continue the Foley or the next week.  He will follow up with [facility] at that point for a voiding trial.  We will plan on getting a beam at all Monday to ensure his creatinine is normalizing back to baseline.  Consults: None  Significant Diagnostic Studies: labs: bmet, cbc  Treatments: IV hydration, analgesia: acetaminophen and surgery: lap R colectomy  Discharge Exam: Blood pressure (!) 158/96, pulse (!) 54, temperature 98.2 F (36.8 C), temperature source Oral, resp. rate 18, height 5\' 6"  (1.676 m), weight 106 kg, SpO2 96 %. General appearance: alert and cooperative GI: normal findings: soft, non-tender Incision/Wound: clean, dry, intact  Disposition: Discharge disposition: 01-Home or Self Care        Allergies as of 05/26/2018      Reactions   Amoxicillin Other (See Comments)   Can't move Has patient had a PCN reaction causing immediate rash, facial/tongue/throat swelling, SOB or lightheadedness with hypotension: No Has patient had a PCN reaction causing severe rash involving mucus membranes or skin necrosis: No Has patient had a PCN reaction that required hospitalization No Has patient had a PCN reaction occurring within the last  10 years: No If all of the above answers are "NO", then may proceed with Cephalosporin use.   Penicillins Other (See Comments)   Can't move/dizziness Has patient had a PCN reaction causing immediate rash, facial/tongue/throat swelling, SOB or lightheadedness with hypotension: No Has patient had a PCN reaction causing severe rash involving mucus membranes or skin necrosis: No Has patient had a PCN reaction that required hospitalization No Has patient had a PCN reaction occurring within the last 10 years: No If all of the above answers are "NO", then may proceed with Cephalosporin use. Other reaction(s): Other Can't move Can't move      Medication List    TAKE these medications   albuterol 108 (90 Base) MCG/ACT inhaler Commonly known as:  PROVENTIL HFA;VENTOLIN HFA Inhale 2 puffs into the lungs every 6 (six) hours as needed for wheezing or shortness of breath.   albuterol (5 MG/ML) 0.5% nebulizer solution Commonly known as:  PROVENTIL Take 0.5 mLs (2.5 mg total) by nebulization every 6 (six) hours as needed for wheezing or shortness of breath.   amLODipine 10 MG tablet Commonly known as:  NORVASC Take 10 mg by mouth 2 (two) times daily.   aspirin EC 81 MG tablet Take 81 mg by mouth daily.   atorvastatin 80 MG tablet Commonly known as:  LIPITOR Take 1 tablet (80 mg total) by mouth daily. What changed:    how much to take  when to take this   FISH OIL PO Take 1 capsule by mouth daily.   KLOR-CON M20 20 MEQ tablet Generic drug:  potassium  chloride SA TAKE 1 TABLET BY MOUTH TWICE A DAY What changed:    how much to take  when to take this   LUBRICANT EYE DROPS 0.4-0.3 % Soln Generic drug:  Polyethyl Glycol-Propyl Glycol Place 1 drop into both eyes 3 (three) times daily as needed (for dry eyes).   ONE TOUCH ULTRA TEST test strip Generic drug:  glucose blood CHECK BLOOD SUGAR 2-3 TIMES A DAY   tamsulosin 0.4 MG Caps capsule Commonly known as:  FLOMAX Take 1  capsule (0.4 mg total) by mouth daily.      Follow-up Information    Leighton Ruff, MD. Schedule an appointment as soon as possible for a visit in 2 weeks.   Specialty:  General Surgery Contact information: [address] [place] [address] [phone]        Alexis Frock, MD Follow up.   Specialty:  Urology Why:  next week for foley removal.  We will call you with the apt time Contact information: [address] [address] [phone]           Signed: Rosario Adie 8/83/2549, 8:26 AM

## 2018-05-27 ENCOUNTER — Ambulatory Visit (HOSPITAL_COMMUNITY): Payer: Medicare Other | Attending: Cardiology

## 2018-05-27 ENCOUNTER — Other Ambulatory Visit: Payer: Self-pay

## 2018-05-27 DIAGNOSIS — I119 Hypertensive heart disease without heart failure: Secondary | ICD-10-CM | POA: Insufficient documentation

## 2018-05-27 DIAGNOSIS — R931 Abnormal findings on diagnostic imaging of heart and coronary circulation: Secondary | ICD-10-CM | POA: Insufficient documentation

## 2018-05-27 DIAGNOSIS — I77810 Thoracic aortic ectasia: Secondary | ICD-10-CM | POA: Diagnosis not present

## 2018-05-27 DIAGNOSIS — I351 Nonrheumatic aortic (valve) insufficiency: Secondary | ICD-10-CM | POA: Diagnosis present

## 2018-05-27 DIAGNOSIS — I088 Other rheumatic multiple valve diseases: Secondary | ICD-10-CM | POA: Diagnosis not present

## 2018-05-27 DIAGNOSIS — I080 Rheumatic disorders of both mitral and aortic valves: Secondary | ICD-10-CM | POA: Insufficient documentation

## 2018-05-29 ENCOUNTER — Telehealth: Payer: Self-pay | Admitting: General Surgery

## 2018-05-29 NOTE — Telephone Encounter (Signed)
Pt notified of elevated Cr (3.0) results from labwork today.  Pt making urine and tolerating a diet.  Feels like he has some low grade fevers.  Discussed with Dr Gloriann Loan.  We recommend he be evaluated in ED with noncontrast CT scan.  He will go to the ED tom am to get this done.    Rosario Adie, MD  [facility]

## 2018-05-30 ENCOUNTER — Emergency Department (HOSPITAL_COMMUNITY): Payer: Medicare Other

## 2018-05-30 ENCOUNTER — Encounter (HOSPITAL_COMMUNITY): Payer: Self-pay | Admitting: *Deleted

## 2018-05-30 ENCOUNTER — Emergency Department (HOSPITAL_COMMUNITY)
Admission: EM | Admit: 2018-05-30 | Discharge: 2018-05-30 | Disposition: A | Discharge status: Home / Self Care | Payer: Medicare Other | Attending: Emergency Medicine | Admitting: Emergency Medicine

## 2018-05-30 DIAGNOSIS — I5032 Chronic diastolic (congestive) heart failure: Secondary | ICD-10-CM | POA: Diagnosis not present

## 2018-05-30 DIAGNOSIS — Z9889 Other specified postprocedural states: Secondary | ICD-10-CM | POA: Insufficient documentation

## 2018-05-30 DIAGNOSIS — Z87891 Personal history of nicotine dependence: Secondary | ICD-10-CM | POA: Insufficient documentation

## 2018-05-30 DIAGNOSIS — D49512 Neoplasm of unspecified behavior of left kidney: Secondary | ICD-10-CM | POA: Insufficient documentation

## 2018-05-30 DIAGNOSIS — I10 Essential (primary) hypertension: Secondary | ICD-10-CM | POA: Insufficient documentation

## 2018-05-30 DIAGNOSIS — I2510 Atherosclerotic heart disease of native coronary artery without angina pectoris: Secondary | ICD-10-CM | POA: Diagnosis not present

## 2018-05-30 DIAGNOSIS — Z8546 Personal history of malignant neoplasm of prostate: Secondary | ICD-10-CM | POA: Insufficient documentation

## 2018-05-30 DIAGNOSIS — D649 Anemia, unspecified: Secondary | ICD-10-CM | POA: Insufficient documentation

## 2018-05-30 DIAGNOSIS — R7989 Other specified abnormal findings of blood chemistry: Secondary | ICD-10-CM

## 2018-05-30 DIAGNOSIS — N2889 Other specified disorders of kidney and ureter: Secondary | ICD-10-CM

## 2018-05-30 DIAGNOSIS — N400 Enlarged prostate without lower urinary tract symptoms: Secondary | ICD-10-CM | POA: Insufficient documentation

## 2018-05-30 DIAGNOSIS — Z79899 Other long term (current) drug therapy: Secondary | ICD-10-CM | POA: Diagnosis not present

## 2018-05-30 DIAGNOSIS — R944 Abnormal results of kidney function studies: Secondary | ICD-10-CM | POA: Insufficient documentation

## 2018-05-30 DIAGNOSIS — Z85528 Personal history of other malignant neoplasm of kidney: Secondary | ICD-10-CM | POA: Insufficient documentation

## 2018-05-30 DIAGNOSIS — J449 Chronic obstructive pulmonary disease, unspecified: Secondary | ICD-10-CM | POA: Diagnosis not present

## 2018-05-30 DIAGNOSIS — I252 Old myocardial infarction: Secondary | ICD-10-CM | POA: Insufficient documentation

## 2018-05-30 DIAGNOSIS — E119 Type 2 diabetes mellitus without complications: Secondary | ICD-10-CM | POA: Diagnosis not present

## 2018-05-30 DIAGNOSIS — Z9049 Acquired absence of other specified parts of digestive tract: Secondary | ICD-10-CM

## 2018-05-30 LAB — URINALYSIS, ROUTINE W REFLEX MICROSCOPIC
BACTERIA UA: NONE SEEN
BILIRUBIN URINE: NEGATIVE
Glucose, UA: NEGATIVE mg/dL
Ketones, ur: NEGATIVE mg/dL
Nitrite: NEGATIVE
PROTEIN: 30 mg/dL — AB
Specific Gravity, Urine: 1.01 (ref 1.005–1.030)
pH: 6 (ref 5.0–8.0)

## 2018-05-30 LAB — COMPREHENSIVE METABOLIC PANEL
ALT: 21 U/L (ref 0–44)
AST: 21 U/L (ref 15–41)
Albumin: 3.6 g/dL (ref 3.5–5.0)
Alkaline Phosphatase: 78 U/L (ref 38–126)
Anion gap: 11 (ref 5–15)
BUN: 53 mg/dL — AB (ref 8–23)
CHLORIDE: 104 mmol/L (ref 98–111)
CO2: 27 mmol/L (ref 22–32)
Calcium: 9 mg/dL (ref 8.9–10.3)
Creatinine, Ser: 3.13 mg/dL — ABNORMAL HIGH (ref 0.61–1.24)
GFR calc Af Amer: 21 mL/min — ABNORMAL LOW (ref >60)
GFR, EST NON AFRICAN AMERICAN: 18 mL/min — AB (ref >60)
Glucose, Bld: 98 mg/dL (ref 70–99)
POTASSIUM: 3.3 mmol/L — AB (ref 3.5–5.1)
SODIUM: 142 mmol/L (ref 135–145)
Total Bilirubin: 0.8 mg/dL (ref 0.3–1.2)
Total Protein: 7.2 g/dL (ref 6.5–8.1)

## 2018-05-30 LAB — CBC WITH DIFFERENTIAL/PLATELET
BASOS ABS: 0 10*3/uL (ref 0.0–0.1)
BASOS PCT: 0 %
EOS ABS: 0.1 10*3/uL (ref 0.0–0.7)
EOS PCT: 2 %
HCT: 26 % — ABNORMAL LOW (ref 39.0–52.0)
Hemoglobin: 8.7 g/dL — ABNORMAL LOW (ref 13.0–17.0)
LYMPHS PCT: 22 %
Lymphs Abs: 1.4 10*3/uL (ref 0.7–4.0)
MCH: 32.2 pg (ref 26.0–34.0)
MCHC: 33.5 g/dL (ref 30.0–36.0)
MCV: 96.3 fL (ref 78.0–100.0)
MONO ABS: 0.9 10*3/uL (ref 0.1–1.0)
Monocytes Relative: 14 %
Neutro Abs: 3.9 10*3/uL (ref 1.7–7.7)
Neutrophils Relative %: 62 %
PLATELETS: 289 10*3/uL (ref 150–400)
RBC: 2.7 MIL/uL — ABNORMAL LOW (ref 4.22–5.81)
RDW: 13.1 % (ref 11.5–15.5)
WBC: 6.4 10*3/uL (ref 4.0–10.5)

## 2018-05-30 LAB — LIPASE, BLOOD: LIPASE: 29 U/L (ref 11–51)

## 2018-05-30 MED ORDER — IOHEXOL 300 MG/ML  SOLN
30.0000 mL | Freq: Once | INTRAMUSCULAR | Status: AC | PRN
  Administered 2018-05-30: 30 mL via ORAL

## 2018-05-30 NOTE — ED Triage Notes (Signed)
Pt sent here by [facility] for elevated creatinine. Pt had surgery to remove colon polyp last week. Pt has had indwelling catheter since surgery last week. Pt states he has had urine output and has not noticed blood in urine.

## 2018-05-30 NOTE — Discharge Instructions (Signed)
Your kidney function called creatinine is still elevated.  You are also anemic.  CT scan today showed a possible tumor on your left kidney.  You must follow-up with urologist next week as scheduled.  No restrictions on eating or drinking.

## 2018-05-30 NOTE — ED Provider Notes (Addendum)
[facility] DEPT Provider Note   CSN: [id number] Arrival date & time: 05/30/18  5361     History   Chief Complaint Chief Complaint  Patient presents with  . Abnormal Lab    HPI [name] [name] is a 72 y.o. male.  Level 5 caveat for patient's uncertainty of history.  He is status post 05/21/2018 laparoscopic right colectomy for colonic polyps by Dr. Marcello Moores.  He was discharged on 05/23/2018.  There is a concern about his elevated creatinine.  He has a indwelling Foley catheter in place.  No fever, sweats, chills, abdominal pain.       Past Medical History:  Diagnosis Date  . Abnormal LFTs (liver function tests)   . Anxiety   . Aortic root dilatation ([place])    a. 12/2015 Echo: mod dil of sinus of valsalva and Ao root (29m).  b. 53 mm Echo 2018  . BPH with urinary obstruction   . Bradycardia   . 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 CAD (coronary artery disease)    a. 12/2015 NSTEMI/Cath (in setting of PAF):  LM nl, LAD 30p,  LCX 380mRCA  ok, AM 100%, RPDA1 40, RPDA 2 60 ->Med Rx.  . Cervical pain 12/09/2014  . Childhood asthma   . Chronic diastolic CHF (congestive heart failure) (HCPorter   a. 12/2015 Echo: EF 50-55%, mod AI, mod Ao root dil, mild MR, mod dil LA, mod RA.  . Marland KitchenOPD (chronic obstructive pulmonary disease) (HCBrainerd   pt denies at preop  . Diabetes mellitus type II    diet controlled  . History of syncope   . Hyperlipidemia   . Hypertensive heart disease   . Kidney lump 04/04/2010   Overview:  Renal Cell Carcinoma   . Moderate aortic insufficiency    a. 12/2015 Echo: Mod AI.  . Marland Kitchenveractive bladder   . Paroxysmal atrial fibrillation (HCMonro[place]   a. 12/2015 started on Xarelto (CHA2DS2VASc = 4-5).  . Pneumonia   . Prostate cancer (HCOverland Park  . Renal cell carcinoma (HCDowagiac  . Renal lesion   . Sleep apnea    Does not like  CPAP  . Spinal stenosis in cervical region  10/12/2016    Patient Active Problem List   Diagnosis Date Noted  . Colon polyp 05/21/2018  . Aortic valve regurgitation 04/14/2018  . Medication management 04/14/2018  . Thoracic aortic aneurysm without rupture (HCClearlake01/28/2019  . 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 Head trauma, subsequent encounter 10/12/2016  . 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 Spinal stenosis in cervical region 10/12/2016  . Cervical spondylosis 10/12/2016  . Syncope and collapse 10/12/2016  . Fall   . Syncope 10/11/2016  . CAD (coronary artery disease)   . Chronic diastolic CHF (congestive heart failure) (HCGerlach  . Hyperlipidemia   . Hypertensive heart disease   . Paroxysmal atrial fibrillation (HCLowndesboro04/16/2017  . Multiple myeloma (HCZuehl04/16/2017  . Mild intermittent asthma 10/18/2015  . Aortic root dilatation (HCEdenborn02/14/2017  . Diabetes (HCViola02/14/2017  . 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 Hand paresthesia 12/09/2014  . Elevated prostate specific antigen (PSA) 07/12/2014  . Allergic rhinitis 07/12/2014  . LBP (low back pain) 07/12/2014  . Palpitation 07/11/2014  . Patient's other noncompliance with medication regimen 08/10/2013  .  Chronic venous insufficiency 07/23/2012  . Obstructive apnea 04/11/2011  . Essential (primary) hypertension 09/07/2010  . 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 Anxiety, generalized 04/04/2010  . Cardiac conduction disorder 02/27/2010  . Benign prostatic hypertrophy without urinary obstruction 09/13/2009  . Abnormal findings on examination of genitourinary organs 08/09/2009  . Hereditary and idiopathic neuropathy 07/05/2009    Past Surgical History:  Procedure Laterality Date  . BACK SURGERY    . CARDIAC CATHETERIZATION N/A 12/18/2015   Procedure: Left Heart Cath and Coronary Angiography;  Surgeon: Leonie Man,  MD;  Location: [facility];  Service: Cardiovascular;  Laterality: N/A;  . DIAGNOSTIC LAPAROSCOPY     partial colectomy  . LAPAROSCOPIC PARTIAL COLECTOMY Right 05/21/2018   Procedure: LAPAROSCOPIC RIGHT  COLECTOMY ERAS PATHWAY;  Surgeon: Leighton Ruff, MD;  Location: [facility];  Service: General;  Laterality: Right;  . [place]   "replaced a disc"        Home Medications    Prior to Admission medications   Medication Sig Start Date End Date Taking? Authorizing Provider  albuterol (PROVENTIL HFA;VENTOLIN HFA) 108 (90 Base) MCG/ACT inhaler Inhale 2 puffs into the lungs every 6 (six) hours as needed for wheezing or shortness of breath. 10/18/15  Yes Burns, Alexa R, MD  albuterol (PROVENTIL) (5 MG/ML) 0.5% nebulizer solution Take 0.5 mLs (2.5 mg total) by nebulization every 6 (six) hours as needed for wheezing or shortness of breath. 10/18/15  Yes Burns, Alexa R, MD  amLODipine (NORVASC) 10 MG tablet Take 10 mg by mouth 2 (two) times daily.    Yes [provider]  atorvastatin (LIPITOR) 80 MG tablet Take 1 tablet (80 mg total) by mouth daily. Patient taking differently: Take 40 mg by mouth 2 (two) times daily.  04/14/18 07/13/18 Yes Hochrein, Jeneen Rinks, MD  KLOR-CON M20 20 MEQ tablet TAKE 1 TABLET BY MOUTH TWICE A DAY Patient taking differently: Take 20 mEq by mouth daily.  04/08/18  Yes Minus Breeding, MD  tamsulosin (FLOMAX) 0.4 MG CAPS capsule Take 1 capsule (0.4 mg total) by mouth daily. 1/95/09  Yes Leighton Ruff, MD    Family History Family History  Problem Relation Age of Onset  . Emphysema Father   . Asthma Father   . Liver disease Father        tumor  . Heart disease Mother   . Hypertension Mother   . Asthma Sister   . Hypertension Son     Social History Social History   Tobacco Use  . Smoking status: Former Smoker    Types: Cigars    Last attempt to quit: 09/02/1977    Years since quitting: 40.7  . Smokeless tobacco: Never Used  . Tobacco  comment:    Substance Use Topics  . Alcohol use: Not Currently    Comment: "stopped drinking alcohol in ~ 1980; just drank a little on the weekends when I did drink"  . Drug use: No     Allergies   Amoxicillin and Penicillins   Review of Systems Review of Systems  All other systems reviewed and are negative.    Physical Exam Updated Vital Signs BP 138/74 (BP Location: Left Arm)   Pulse (!) 59   Temp 98.6 F (37 C) (Oral)   Resp 19   SpO2 100%   Physical Exam  Constitutional: He is oriented to person, place, and time. He appears well-developed and well-nourished.  HENT:  Head: Normocephalic and atraumatic.  Eyes: Conjunctivae are normal.  Neck: Neck supple.  Cardiovascular: Normal rate and regular rhythm.  Pulmonary/Chest: Effort normal and breath sounds normal.  Abdominal: Soft. Bowel sounds are normal.  Musculoskeletal: Normal range of motion.  Neurological: He is alert and oriented to person, place, and time.  Skin: Skin is warm and dry.  Psychiatric: He has a normal mood and affect. His behavior is normal.  Nursing note and vitals reviewed.    ED Treatments / Results  Labs (all labs ordered are listed, but only abnormal results are displayed) Labs Reviewed  CBC WITH DIFFERENTIAL/PLATELET - Abnormal; Notable for the following components:      Result Value   RBC 2.70 (*)    Hemoglobin 8.7 (*)    HCT 26.0 (*)    All other components within normal limits  COMPREHENSIVE METABOLIC PANEL - Abnormal; Notable for the following components:   Potassium 3.3 (*)    BUN 53 (*)    Creatinine, Ser 3.13 (*)    GFR calc non Af Amer 18 (*)    GFR calc Af Amer 21 (*)    All other components within normal limits  URINALYSIS, ROUTINE W REFLEX MICROSCOPIC - Abnormal; Notable for the following components:   Hgb urine dipstick MODERATE (*)    Protein, ur 30 (*)    Leukocytes, UA MODERATE (*)    All other components within normal limits  URINE CULTURE  LIPASE, BLOOD     EKG None  Radiology Ct Abdomen Pelvis Wo Contrast  Result Date: 05/30/2018 CLINICAL DATA:  Partial right colectomy, perform laparoscopically. EXAM: CT ABDOMEN AND PELVIS WITHOUT CONTRAST TECHNIQUE: Multidetector CT imaging of the abdomen and pelvis was performed following the standard protocol without IV contrast. COMPARISON:  March 24, 2018 FINDINGS: Lower chest: Cardiomegaly.  No other abnormalities. Hepatobiliary: Multiple low-attenuation lesions in the liver are most likely cysts, unchanged. The liver and gallbladder are otherwise unremarkable. Pancreas: Unremarkable. No pancreatic ductal dilatation or surrounding inflammatory changes. Spleen: Normal in size without focal abnormality. Adrenals/Urinary Tract: Adrenal glands are normal. A 4.1 cm apparently solid mass is seen in the upper pole of the left kidney measuring 3.9 cm previously. A 2.7 cm mass in the medial left kidney on series 2, image 28 is essentially unchanged and nonspecific. Several renal cysts are noted. There is a solid and cystic appearing mass in the lower pole of the right kidney difficult to compare to the previous study due to lack of contrast today. However, the maximum measurement is approximately 4.6 cm, unchanged. Several probable hyperdense cysts are noted. The bladder is decompressed with a Foley catheter. There is mild fat stranding adjacent to the bladder which is a new finding. No renal stones or hydronephrosis. No ureteral stones. Stomach/Bowel: The stomach and small bowel are normal. The patient is status post partial right colectomy scattered colonic diverticuli are noted. There is a new ventral hernia, located superior to the previously identified umbilical hernia. This is best seen on series 2, image 35. There is fat stranding within the hernia which likely contains omentum. It also contains a portion of transverse colon which does not appear to be inflamed. The wall of the hernia is thickened and inflamed. Surgical  changes are seen in the right lower abdomen at the site of right colectomy. There is some increased attenuation in the surrounding fat, likely postoperative. No collection to suggest abscess or leak. The appendix is been removed along with the right partial colectomy. Vascular/Lymphatic: Mild atherosclerotic changes in the aorta.  No obvious adenopathy. Reproductive: Again noted is an enlarged prostate exerting mass effect on the posterior bladder. Other: No abdominal wall hernia or abnormality. No abdominopelvic ascites. Musculoskeletal: No acute or significant osseous findings. IMPRESSION: 1. At least 2 renal masses concerning for renal cell carcinoma. The masses were better assessed with a previous contrast-enhanced study. Recommend an MRI when the patient is able. 2. There is a new ventral hernia. The periphery of the hernia/hernia wall is thickened and inflamed. The hernia contains transverse colon which does not appear to be inflamed or obstructed. There is also likely some herniated omentum with associated fat stranding. This hernia is new compared to March 24, 2018. No associated bowel obstruction. 3. Inflammatory changes in the right lower quadrant, surrounding the site of right partial colectomy. These findings are likely postsurgical as surgery was only just over 1 week ago. No evidence of leak or abscess. 4. Atherosclerotic changes in the abdominal aorta. Electronically Signed   By: Dorise Bullion III M.D   On: 05/30/2018 12:24    Procedures Procedures (including critical care time)  Medications Ordered in ED Medications  iohexol (OMNIPAQUE) 300 MG/ML solution 30 mL (30 mLs Oral Contrast Given 05/30/18 1031)     Initial Impression / Assessment and Plan / ED Course  I have reviewed the triage vital signs and the nursing notes.  Pertinent labs & imaging results that were available during my care of the patient were reviewed by me and considered in my medical decision making (see chart for  details).     Patient is status post right colectomy on 05/21/2018.  He has an indwelling Foley catheter.  His creatinine has remained elevated (3.13 today).  Urinalysis shows minimal evidence of infection.  Hemoglobin today 8.7 (10 on 05/24/2017).  CT abdomen pelvis reveal renal masses on the left kidney, postsurgical changes of the colon, ventral hernia.  Will discuss with urology.  1400: Discussed clinical scenario with the urologist on-call.  He recommended keeping catheter in place.  I also discussed all the clinical findings with the patient including elevated creatinine, anemia, left renal mass, ventral hernia.  He will follow-up with urologist early in the week.   CRITICAL CARE Performed by: Nat Christen Total critical care time: 30 minutes Critical care time was exclusive of separately billable procedures and treating other patients. Critical care was necessary to treat or prevent imminent or life-threatening deterioration. Critical care was time spent personally by me on the following activities: development of treatment plan with patient and/or surrogate as well as nursing, discussions with consultants, evaluation of patient's response to treatment, examination of patient, obtaining history from patient or surrogate, ordering and performing treatments and interventions, ordering and review of laboratory studies, ordering and review of radiographic studies, pulse oximetry and re-evaluation of patient's condition.  Final Clinical Impressions(s) / ED Diagnoses   Final diagnoses:  Creatinine elevation  S/P right colectomy  Anemia, unspecified type  Renal mass, left    ED Discharge Orders    None       Nat Christen, MD 05/30/18 1355    Nat Christen, MD 05/30/18 1412    Nat Christen, MD 05/30/18 [phone]

## 2018-05-30 NOTE — ED Notes (Signed)
Patient transported to CT 

## 2018-05-31 LAB — URINE CULTURE: Culture: NO GROWTH

## 2018-06-05 ENCOUNTER — Emergency Department (HOSPITAL_COMMUNITY): Payer: Medicare Other

## 2018-06-05 ENCOUNTER — Inpatient Hospital Stay (HOSPITAL_COMMUNITY)
Admission: EM | Admit: 2018-06-05 | Discharge: 2018-06-17 | DRG: 698 | Disposition: A | Discharge status: Home Health | Payer: Medicare Other | Attending: Internal Medicine | Admitting: Internal Medicine

## 2018-06-05 ENCOUNTER — Other Ambulatory Visit: Payer: Self-pay

## 2018-06-05 ENCOUNTER — Encounter (HOSPITAL_COMMUNITY): Payer: Self-pay | Admitting: Emergency Medicine

## 2018-06-05 DIAGNOSIS — F419 Anxiety disorder, unspecified: Secondary | ICD-10-CM | POA: Diagnosis not present

## 2018-06-05 DIAGNOSIS — R338 Other retention of urine: Secondary | ICD-10-CM | POA: Diagnosis present

## 2018-06-05 DIAGNOSIS — I77810 Thoracic aortic ectasia: Secondary | ICD-10-CM | POA: Diagnosis present

## 2018-06-05 DIAGNOSIS — N3001 Acute cystitis with hematuria: Secondary | ICD-10-CM | POA: Diagnosis not present

## 2018-06-05 DIAGNOSIS — C9000 Multiple myeloma not having achieved remission: Secondary | ICD-10-CM | POA: Diagnosis present

## 2018-06-05 DIAGNOSIS — I480 Paroxysmal atrial fibrillation: Secondary | ICD-10-CM | POA: Diagnosis present

## 2018-06-05 DIAGNOSIS — N183 Chronic kidney disease, stage 3 (moderate): Secondary | ICD-10-CM | POA: Diagnosis present

## 2018-06-05 DIAGNOSIS — R319 Hematuria, unspecified: Secondary | ICD-10-CM | POA: Diagnosis present

## 2018-06-05 DIAGNOSIS — R000 Tachycardia, unspecified: Secondary | ICD-10-CM | POA: Diagnosis not present

## 2018-06-05 DIAGNOSIS — I5032 Chronic diastolic (congestive) heart failure: Secondary | ICD-10-CM | POA: Diagnosis present

## 2018-06-05 DIAGNOSIS — C642 Malignant neoplasm of left kidney, except renal pelvis: Secondary | ICD-10-CM | POA: Diagnosis present

## 2018-06-05 DIAGNOSIS — A4181 Sepsis due to Enterococcus: Secondary | ICD-10-CM | POA: Diagnosis present

## 2018-06-05 DIAGNOSIS — I2510 Atherosclerotic heart disease of native coronary artery without angina pectoris: Secondary | ICD-10-CM | POA: Diagnosis present

## 2018-06-05 DIAGNOSIS — D631 Anemia in chronic kidney disease: Secondary | ICD-10-CM | POA: Diagnosis present

## 2018-06-05 DIAGNOSIS — I130 Hypertensive heart and chronic kidney disease with heart failure and stage 1 through stage 4 chronic kidney disease, or unspecified chronic kidney disease: Secondary | ICD-10-CM | POA: Diagnosis present

## 2018-06-05 DIAGNOSIS — E119 Type 2 diabetes mellitus without complications: Secondary | ICD-10-CM

## 2018-06-05 DIAGNOSIS — N390 Urinary tract infection, site not specified: Secondary | ICD-10-CM

## 2018-06-05 DIAGNOSIS — Y732 Prosthetic and other implants, materials and accessory gastroenterology and urology devices associated with adverse incidents: Secondary | ICD-10-CM | POA: Diagnosis present

## 2018-06-05 DIAGNOSIS — E785 Hyperlipidemia, unspecified: Secondary | ICD-10-CM | POA: Diagnosis not present

## 2018-06-05 DIAGNOSIS — D630 Anemia in neoplastic disease: Secondary | ICD-10-CM | POA: Diagnosis present

## 2018-06-05 DIAGNOSIS — Z825 Family history of asthma and other chronic lower respiratory diseases: Secondary | ICD-10-CM

## 2018-06-05 DIAGNOSIS — I119 Hypertensive heart disease without heart failure: Secondary | ICD-10-CM | POA: Diagnosis present

## 2018-06-05 DIAGNOSIS — N179 Acute kidney failure, unspecified: Secondary | ICD-10-CM

## 2018-06-05 DIAGNOSIS — N400 Enlarged prostate without lower urinary tract symptoms: Secondary | ICD-10-CM

## 2018-06-05 DIAGNOSIS — I351 Nonrheumatic aortic (valve) insufficiency: Secondary | ICD-10-CM | POA: Diagnosis present

## 2018-06-05 DIAGNOSIS — E1122 Type 2 diabetes mellitus with diabetic chronic kidney disease: Secondary | ICD-10-CM | POA: Diagnosis present

## 2018-06-05 DIAGNOSIS — Z87891 Personal history of nicotine dependence: Secondary | ICD-10-CM

## 2018-06-05 DIAGNOSIS — I252 Old myocardial infarction: Secondary | ICD-10-CM

## 2018-06-05 DIAGNOSIS — N138 Other obstructive and reflux uropathy: Secondary | ICD-10-CM | POA: Diagnosis present

## 2018-06-05 DIAGNOSIS — I444 Left anterior fascicular block: Secondary | ICD-10-CM | POA: Diagnosis present

## 2018-06-05 DIAGNOSIS — I2583 Coronary atherosclerosis due to lipid rich plaque: Secondary | ICD-10-CM | POA: Diagnosis not present

## 2018-06-05 DIAGNOSIS — R748 Abnormal levels of other serum enzymes: Secondary | ICD-10-CM | POA: Diagnosis not present

## 2018-06-05 DIAGNOSIS — N3281 Overactive bladder: Secondary | ICD-10-CM | POA: Diagnosis present

## 2018-06-05 DIAGNOSIS — T83511A Infection and inflammatory reaction due to indwelling urethral catheter, initial encounter: Secondary | ICD-10-CM | POA: Diagnosis present

## 2018-06-05 DIAGNOSIS — Z880 Allergy status to penicillin: Secondary | ICD-10-CM

## 2018-06-05 DIAGNOSIS — E876 Hypokalemia: Secondary | ICD-10-CM | POA: Diagnosis present

## 2018-06-05 DIAGNOSIS — I10 Essential (primary) hypertension: Secondary | ICD-10-CM | POA: Diagnosis not present

## 2018-06-05 DIAGNOSIS — R6520 Severe sepsis without septic shock: Secondary | ICD-10-CM | POA: Diagnosis present

## 2018-06-05 DIAGNOSIS — G4730 Sleep apnea, unspecified: Secondary | ICD-10-CM | POA: Diagnosis present

## 2018-06-05 DIAGNOSIS — C641 Malignant neoplasm of right kidney, except renal pelvis: Secondary | ICD-10-CM | POA: Diagnosis present

## 2018-06-05 DIAGNOSIS — D649 Anemia, unspecified: Secondary | ICD-10-CM

## 2018-06-05 DIAGNOSIS — Z8249 Family history of ischemic heart disease and other diseases of the circulatory system: Secondary | ICD-10-CM

## 2018-06-05 DIAGNOSIS — A419 Sepsis, unspecified organism: Secondary | ICD-10-CM

## 2018-06-05 DIAGNOSIS — R509 Fever, unspecified: Secondary | ICD-10-CM | POA: Diagnosis present

## 2018-06-05 DIAGNOSIS — Z85528 Personal history of other malignant neoplasm of kidney: Secondary | ICD-10-CM

## 2018-06-05 DIAGNOSIS — Z8379 Family history of other diseases of the digestive system: Secondary | ICD-10-CM

## 2018-06-05 DIAGNOSIS — M542 Cervicalgia: Secondary | ICD-10-CM | POA: Diagnosis not present

## 2018-06-05 DIAGNOSIS — Z9049 Acquired absence of other specified parts of digestive tract: Secondary | ICD-10-CM

## 2018-06-05 DIAGNOSIS — A4159 Other Gram-negative sepsis: Secondary | ICD-10-CM | POA: Diagnosis present

## 2018-06-05 DIAGNOSIS — R7881 Bacteremia: Secondary | ICD-10-CM | POA: Diagnosis not present

## 2018-06-05 DIAGNOSIS — Z86010 Personal history of colonic polyps: Secondary | ICD-10-CM

## 2018-06-05 DIAGNOSIS — J449 Chronic obstructive pulmonary disease, unspecified: Secondary | ICD-10-CM | POA: Diagnosis present

## 2018-06-05 DIAGNOSIS — N401 Enlarged prostate with lower urinary tract symptoms: Secondary | ICD-10-CM | POA: Diagnosis present

## 2018-06-05 DIAGNOSIS — Z79899 Other long term (current) drug therapy: Secondary | ICD-10-CM

## 2018-06-05 DIAGNOSIS — N2889 Other specified disorders of kidney and ureter: Secondary | ICD-10-CM | POA: Diagnosis present

## 2018-06-05 DIAGNOSIS — Z6837 Body mass index (BMI) 37.0-37.9, adult: Secondary | ICD-10-CM

## 2018-06-05 DIAGNOSIS — E669 Obesity, unspecified: Secondary | ICD-10-CM | POA: Diagnosis present

## 2018-06-05 LAB — CBC WITH DIFFERENTIAL/PLATELET
Abs Immature Granulocytes: 0.1 10*3/uL (ref 0.0–0.1)
BASOS PCT: 0 %
Basophils Absolute: 0 10*3/uL (ref 0.0–0.1)
EOS PCT: 1 %
Eosinophils Absolute: 0.2 10*3/uL (ref 0.0–0.7)
HCT: 25.8 % — ABNORMAL LOW (ref 39.0–52.0)
Hemoglobin: 8.3 g/dL — ABNORMAL LOW (ref 13.0–17.0)
Immature Granulocytes: 0 %
Lymphocytes Relative: 4 %
Lymphs Abs: 0.5 10*3/uL — ABNORMAL LOW (ref 0.7–4.0)
MCH: 31.6 pg (ref 26.0–34.0)
MCHC: 32.2 g/dL (ref 30.0–36.0)
MCV: 98.1 fL (ref 78.0–100.0)
Monocytes Absolute: 0.3 10*3/uL (ref 0.1–1.0)
Monocytes Relative: 2 %
NEUTROS PCT: 93 %
Neutro Abs: 12.5 10*3/uL — ABNORMAL HIGH (ref 1.7–7.7)
PLATELETS: 302 10*3/uL (ref 150–400)
RBC: 2.63 MIL/uL — AB (ref 4.22–5.81)
RDW: 12.5 % (ref 11.5–15.5)
WBC: 13.4 10*3/uL — AB (ref 4.0–10.5)

## 2018-06-05 LAB — COMPREHENSIVE METABOLIC PANEL
ALT: 18 U/L (ref 0–44)
ANION GAP: 14 (ref 5–15)
AST: 26 U/L (ref 15–41)
Albumin: 3.2 g/dL — ABNORMAL LOW (ref 3.5–5.0)
Alkaline Phosphatase: 78 U/L (ref 38–126)
BUN: 51 mg/dL — ABNORMAL HIGH (ref 8–23)
CALCIUM: 8.8 mg/dL — AB (ref 8.9–10.3)
CHLORIDE: 99 mmol/L (ref 98–111)
CO2: 22 mmol/L (ref 22–32)
CREATININE: 3.72 mg/dL — AB (ref 0.61–1.24)
GFR calc non Af Amer: 15 mL/min — ABNORMAL LOW (ref >60)
GFR, EST AFRICAN AMERICAN: 17 mL/min — AB (ref >60)
Glucose, Bld: 146 mg/dL — ABNORMAL HIGH (ref 70–99)
Potassium: 3.2 mmol/L — ABNORMAL LOW (ref 3.5–5.1)
SODIUM: 135 mmol/L (ref 135–145)
Total Bilirubin: 1.7 mg/dL — ABNORMAL HIGH (ref 0.3–1.2)
Total Protein: 6.8 g/dL (ref 6.5–8.1)

## 2018-06-05 LAB — URINALYSIS, ROUTINE W REFLEX MICROSCOPIC
BILIRUBIN URINE: NEGATIVE
GLUCOSE, UA: NEGATIVE mg/dL
KETONES UR: NEGATIVE mg/dL
NITRITE: NEGATIVE
PROTEIN: 100 mg/dL — AB
Specific Gravity, Urine: 1.02 (ref 1.005–1.030)
pH: 6 (ref 5.0–8.0)

## 2018-06-05 LAB — I-STAT CG4 LACTIC ACID, ED: LACTIC ACID, VENOUS: 1.63 mmol/L (ref 0.5–1.9)

## 2018-06-05 LAB — MRSA PCR SCREENING: MRSA BY PCR: NEGATIVE

## 2018-06-05 MED ORDER — HYDROCODONE-ACETAMINOPHEN 5-325 MG PO TABS
1.0000 | ORAL_TABLET | ORAL | Status: DC | PRN
  Administered 2018-06-17: 2 via ORAL
  Filled 2018-06-05: qty 2

## 2018-06-05 MED ORDER — VANCOMYCIN HCL 10 G IV SOLR
2000.0000 mg | Freq: Once | INTRAVENOUS | Status: AC
  Administered 2018-06-05: 2000 mg via INTRAVENOUS
  Filled 2018-06-05: qty 2000

## 2018-06-05 MED ORDER — METRONIDAZOLE IN NACL 5-0.79 MG/ML-% IV SOLN
500.0000 mg | Freq: Three times a day (TID) | INTRAVENOUS | Status: DC
  Administered 2018-06-05: 500 mg via INTRAVENOUS
  Filled 2018-06-05: qty 100

## 2018-06-05 MED ORDER — ACETAMINOPHEN 650 MG RE SUPP
650.0000 mg | Freq: Four times a day (QID) | RECTAL | Status: DC | PRN
  Filled 2018-06-05: qty 1

## 2018-06-05 MED ORDER — SODIUM CHLORIDE 0.9 % IV SOLN
2.0000 g | Freq: Once | INTRAVENOUS | Status: AC
  Administered 2018-06-05: 2 g via INTRAVENOUS
  Filled 2018-06-05: qty 2

## 2018-06-05 MED ORDER — HEPARIN SODIUM (PORCINE) 5000 UNIT/ML IJ SOLN
5000.0000 [IU] | Freq: Three times a day (TID) | INTRAMUSCULAR | Status: DC
  Administered 2018-06-05 – 2018-06-11 (×18): 5000 [IU] via SUBCUTANEOUS
  Filled 2018-06-05 (×16): qty 1

## 2018-06-05 MED ORDER — SODIUM CHLORIDE 0.9 % IV SOLN
1.0000 g | INTRAVENOUS | Status: DC
  Administered 2018-06-06: 1 g via INTRAVENOUS
  Filled 2018-06-05: qty 1

## 2018-06-05 MED ORDER — SODIUM CHLORIDE 0.9 % IV SOLN
INTRAVENOUS | Status: DC
  Administered 2018-06-05 – 2018-06-08 (×6): via INTRAVENOUS

## 2018-06-05 MED ORDER — SODIUM CHLORIDE 0.9 % IV SOLN: INTRAVENOUS | Status: AC

## 2018-06-05 MED ORDER — ONDANSETRON HCL 4 MG/2ML IJ SOLN
4.0000 mg | Freq: Four times a day (QID) | INTRAMUSCULAR | Status: DC | PRN
  Administered 2018-06-10: 4 mg via INTRAVENOUS
  Filled 2018-06-05: qty 2

## 2018-06-05 MED ORDER — SODIUM CHLORIDE 0.9 % IV BOLUS
1000.0000 mL | Freq: Once | INTRAVENOUS | Status: AC
  Administered 2018-06-05: 1000 mL via INTRAVENOUS

## 2018-06-05 MED ORDER — VANCOMYCIN HCL IN DEXTROSE 1-5 GM/200ML-% IV SOLN: 1000.0000 mg | Freq: Once | INTRAVENOUS | Status: DC

## 2018-06-05 MED ORDER — SENNOSIDES-DOCUSATE SODIUM 8.6-50 MG PO TABS: 2.0000 | ORAL_TABLET | Freq: Two times a day (BID) | ORAL | Status: DC | PRN

## 2018-06-05 MED ORDER — ALBUTEROL SULFATE (2.5 MG/3ML) 0.083% IN NEBU: 2.5000 mg | INHALATION_SOLUTION | RESPIRATORY_TRACT | Status: DC | PRN

## 2018-06-05 MED ORDER — TAMSULOSIN HCL 0.4 MG PO CAPS
0.4000 mg | ORAL_CAPSULE | Freq: Every day | ORAL | Status: DC
  Administered 2018-06-05 – 2018-06-16 (×12): 0.4 mg via ORAL
  Filled 2018-06-05 (×12): qty 1

## 2018-06-05 MED ORDER — ACETAMINOPHEN 325 MG PO TABS
650.0000 mg | ORAL_TABLET | Freq: Four times a day (QID) | ORAL | Status: DC | PRN
  Administered 2018-06-05 – 2018-06-17 (×4): 650 mg via ORAL
  Filled 2018-06-05 (×6): qty 2

## 2018-06-05 MED ORDER — TAMSULOSIN HCL 0.4 MG PO CAPS
0.4000 mg | ORAL_CAPSULE | Freq: Every day | ORAL | Status: DC
  Filled 2018-06-05: qty 1

## 2018-06-05 MED ORDER — ACETAMINOPHEN 325 MG PO TABS
650.0000 mg | ORAL_TABLET | Freq: Once | ORAL | Status: AC
  Administered 2018-06-05: 650 mg via ORAL
  Filled 2018-06-05: qty 2

## 2018-06-05 MED ORDER — ONDANSETRON HCL 4 MG PO TABS
4.0000 mg | ORAL_TABLET | Freq: Four times a day (QID) | ORAL | Status: DC | PRN
  Filled 2018-06-05: qty 1

## 2018-06-05 NOTE — Progress Notes (Signed)
PT Cancellation Note  Patient Details Name: [name] [name] MRN: [id number] DOB: Dec 29, 1945   Cancelled Treatment:    Reason Eval/Treat Not Completed: Patient declined, no reason specified Pt refusing stating he just got back from the bathroom and was too chilled to work with PT. Asking for PT to return tomorrow. Will follow up as schedule allows.   Leighton Ruff, PT, DPT  Acute Rehabilitation Services  Pager: [phone] Office: [phone]    [name] 06/05/2018, 4:39 PM

## 2018-06-05 NOTE — ED Notes (Signed)
Patient transported to CT 

## 2018-06-05 NOTE — ED Triage Notes (Signed)
Per pt he had surgery on Thursday of last week then a CT on Saturday and has been feeling terrible every since. Pt is diaphoretic and sweaty at this time. Tachy and fever of 101.9 by mouth. Alert oreinted x 4

## 2018-06-05 NOTE — ED Provider Notes (Addendum)
[facility] EMERGENCY DEPARTMENT Provider Note   CSN: [id number] Arrival date & time: 06/05/18  5997     History   Chief Complaint Chief Complaint  Patient presents with  . Fever    HPI [name] [name] is a 72 y.o. male.  Patient presents to the emergency department for evaluation of fever.  Patient reports that he has been experiencing diarrhea for 1 day.  Tonight he has been experiencing weakness, fever, chills, sweats.  He denies chest pain, cough.  He did feel short of breath earlier, uses inhaler and the shortness of breath and wheezing resolved.  He has not been experiencing abdominal pain.  He does report that he recently had colon surgery.     Past Medical History:  Diagnosis Date  . Abnormal LFTs (liver function tests)   . Anxiety   . Aortic root dilatation ([place])    a. 12/2015 Echo: mod dil of sinus of valsalva and Ao root (73m).  b. 53 mm Echo 2018  . BPH with urinary obstruction   . Bradycardia   . 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 CAD (coronary artery disease)    a. 12/2015 NSTEMI/Cath (in setting of PAF):  LM nl, LAD 30p,  LCX 332mRCA  ok, AM 100%, RPDA1 40, RPDA 2 60 ->Med Rx.  . Cervical pain 12/09/2014  . Childhood asthma   . Chronic diastolic CHF (congestive heart failure) (HCSan[place]   a. 12/2015 Echo: EF 50-55%, mod AI, mod Ao root dil, mild MR, mod dil LA, mod RA.  . Marland KitchenOPD (chronic obstructive pulmonary disease) (HCSan Marcos   pt denies at preop  . Diabetes mellitus type II    diet controlled  . History of syncope   . Hyperlipidemia   . Hypertensive heart disease   . Kidney lump 04/04/2010   Overview:  Renal Cell Carcinoma   . Moderate aortic insufficiency    a. 12/2015 Echo: Mod AI.  . Marland Kitchenveractive bladder   . Paroxysmal atrial fibrillation ([place]CD[place]   a. 12/2015 started on Xarelto (CHA2DS2VASc = 4-5).  . Pneumonia   . Prostate cancer ([place]  . Renal cell  carcinoma (HC[place]  . Renal lesion   . Sleep apnea    Does not like  CPAP  . Spinal stenosis in cervical region 10/12/2016    Patient Active Problem List   Diagnosis Date Noted  . Sepsis (HCGreenbriar10/12/2017  . Acute lower UTI 06/05/2018  . Colon polyp 05/21/2018  . Aortic valve regurgitation 04/14/2018  . Medication management 04/14/2018  . Thoracic aortic aneurysm without rupture (HCByrdstown01/28/2019  . 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 Head trauma, subsequent encounter 10/12/2016  . 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 Spinal stenosis in cervical region 10/12/2016  . Cervical spondylosis 10/12/2016  . Syncope and collapse 10/12/2016  . Fall   . Syncope 10/11/2016  . CAD (coronary artery disease)   . Chronic diastolic CHF (congestive heart failure) (HCMullins  . Hyperlipidemia   . Hypertensive heart disease   . Paroxysmal atrial fibrillation (HCAtkins04/16/2017  . Multiple myeloma (HCManassas04/16/2017  . Mild intermittent asthma 10/18/2015  . Aortic root dilatation (HCImperial02/14/2017  . Diabetes (HCRut[place]02/14/2017  . 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 Hand paresthesia 12/09/2014  . Elevated prostate  specific antigen (PSA) 07/12/2014  . Allergic rhinitis 07/12/2014  . LBP (low back pain) 07/12/2014  . Palpitation 07/11/2014  . Patient's other noncompliance with medication regimen 08/10/2013  . Chronic venous insufficiency 07/23/2012  . Obstructive apnea 04/11/2011  . Essential (primary) hypertension 09/07/2010  . 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 Anxiety, generalized 04/04/2010  . Cardiac conduction disorder 02/27/2010  . Benign prostatic hyperplasia without urinary obstruction 09/13/2009  . Abnormal findings on examination of genitourinary organs 08/09/2009  . Hereditary and idiopathic neuropathy 07/05/2009    Past Surgical History:    Procedure Laterality Date  . BACK SURGERY    . CARDIAC CATHETERIZATION N/A 12/18/2015   Procedure: Left Heart Cath and Coronary Angiography;  Surgeon: Leonie Man, MD;  Location: [facility];  Service: Cardiovascular;  Laterality: N/A;  . DIAGNOSTIC LAPAROSCOPY     partial colectomy  . LAPAROSCOPIC PARTIAL COLECTOMY Right 05/21/2018   Procedure: LAPAROSCOPIC RIGHT  COLECTOMY ERAS PATHWAY;  Surgeon: Leighton Ruff, MD;  Location: [facility];  Service: General;  Laterality: Right;  . [place]   "replaced a disc"        Home Medications    Prior to Admission medications   Medication Sig Start Date End Date Taking? Authorizing Provider  albuterol (PROVENTIL HFA;VENTOLIN HFA) 108 (90 Base) MCG/ACT inhaler Inhale 2 puffs into the lungs every 6 (six) hours as needed for wheezing or shortness of breath. 10/18/15  Yes Burns, Alexa R, MD  albuterol (PROVENTIL) (5 MG/ML) 0.5% nebulizer solution Take 0.5 mLs (2.5 mg total) by nebulization every 6 (six) hours as needed for wheezing or shortness of breath. 10/18/15  Yes Burns, Alexa R, MD  amLODipine (NORVASC) 10 MG tablet Take 10 mg by mouth 2 (two) times daily.    Yes [provider]  atorvastatin (LIPITOR) 80 MG tablet Take 1 tablet (80 mg total) by mouth daily. Patient taking differently: Take 40 mg by mouth 2 (two) times daily.  04/14/18 07/13/18 Yes Hochrein, Jeneen Rinks, MD  KLOR-CON M20 20 MEQ tablet TAKE 1 TABLET BY MOUTH TWICE A DAY Patient taking differently: Take 20 mEq by mouth daily.  04/08/18  Yes Minus Breeding, MD  tamsulosin (FLOMAX) 0.4 MG CAPS capsule Take 1 capsule (0.4 mg total) by mouth daily. 7/65/46  Yes Leighton Ruff, MD    Family History Family History  Problem Relation Age of Onset  . Emphysema Father   . Asthma Father   . Liver disease Father        tumor  . Heart disease Mother   . Hypertension Mother   . Asthma Sister   . Hypertension Son     Social History Social History   Tobacco  Use  . Smoking status: Former Smoker    Types: Cigars    Last attempt to quit: 09/02/1977    Years since quitting: 40.7  . Smokeless tobacco: Never Used  . Tobacco comment:    Substance Use Topics  . Alcohol use: Not Currently    Comment: "stopped drinking alcohol in ~ 1980; just drank a little on the weekends when I did drink"  . Drug use: No     Allergies   Amoxicillin and Penicillins   Review of Systems Review of Systems  Constitutional: Positive for chills and fever.  Gastrointestinal: Positive for diarrhea.  All other systems reviewed and are negative.    Physical Exam Updated Vital Signs BP 122/81   Pulse 71   Temp 99.1  F (37.3 C) (Oral)   Resp (!) 24   Ht 5' 6" (1.676 m)   Wt 99.8 kg   SpO2 100%   BMI 35.51 kg/m   Physical Exam  Constitutional: He is oriented to person, place, and time. He appears well-developed and well-nourished. No distress.  HENT:  Head: Normocephalic and atraumatic.  Right Ear: Hearing normal.  Left Ear: Hearing normal.  Nose: Nose normal.  Mouth/Throat: Oropharynx is clear and moist and mucous membranes are normal.  Eyes: Pupils are equal, round, and reactive to light. Conjunctivae and EOM are normal.  Neck: Normal range of motion. Neck supple.  Cardiovascular: Regular rhythm, S1 normal and S2 normal. Tachycardia present. Exam reveals no gallop and no friction rub.  No murmur heard. Pulmonary/Chest: Effort normal. Tachypnea noted. No respiratory distress. He has decreased breath sounds. He has wheezes. He exhibits no tenderness.  Abdominal: Soft. Normal appearance and bowel sounds are normal. There is no hepatosplenomegaly. There is no tenderness. There is no rebound, no guarding, no tenderness at McBurney's point and negative Murphy's sign. No hernia.  Musculoskeletal: Normal range of motion.  Neurological: He is alert and oriented to person, place, and time. He has normal strength. No cranial nerve deficit or sensory deficit.  Coordination normal. GCS eye subscore is 4. GCS verbal subscore is 5. GCS motor subscore is 6.  Skin: Skin is warm, dry and intact. No rash noted. No cyanosis.  Here abdominal surgical incision well healing, no erythema, induration, drainage  Psychiatric: He has a normal mood and affect. His speech is normal and behavior is normal. Thought content normal.  Nursing note and vitals reviewed.    ED Treatments / Results  Labs (all labs ordered are listed, but only abnormal results are displayed) Labs Reviewed  COMPREHENSIVE METABOLIC PANEL - Abnormal; Notable for the following components:      Result Value   Potassium 3.2 (*)    Glucose, Bld 146 (*)    BUN 51 (*)    Creatinine, Ser 3.72 (*)    Calcium 8.8 (*)    Albumin 3.2 (*)    Total Bilirubin 1.7 (*)    GFR calc non Af Amer 15 (*)    GFR calc Af Amer 17 (*)    All other components within normal limits  CBC WITH DIFFERENTIAL/PLATELET - Abnormal; Notable for the following components:   WBC 13.4 (*)    RBC 2.63 (*)    Hemoglobin 8.3 (*)    HCT 25.8 (*)    Neutro Abs 12.5 (*)    Lymphs Abs 0.5 (*)    All other components within normal limits  URINALYSIS, ROUTINE W REFLEX MICROSCOPIC - Abnormal; Notable for the following components:   APPearance TURBID (*)    Hgb urine dipstick MODERATE (*)    Protein, ur 100 (*)    Leukocytes, UA SMALL (*)    All other components within normal limits  URINALYSIS, MICROSCOPIC (REFLEX) - Abnormal; Notable for the following components:   Bacteria, UA MANY (*)    Non Squamous Epithelial PRESENT (*)    All other components within normal limits  BASIC METABOLIC PANEL - Abnormal; Notable for the following components:   Potassium 3.4 (*)    CO2 21 (*)    Glucose, Bld 103 (*)    BUN 56 (*)    Creatinine, Ser 4.84 (*)    Calcium 8.6 (*)    GFR calc non Af Amer 11 (*)    GFR calc Af Amer 12 (*)  All other components within normal limits  CBC - Abnormal; Notable for the following components:    RBC 2.53 (*)    Hemoglobin 8.0 (*)    HCT 24.9 (*)    All other components within normal limits  CULTURE, BLOOD (ROUTINE X 2)  CULTURE, BLOOD (ROUTINE X 2)  MRSA PCR SCREENING  BLOOD CULTURE ID PANEL (REFLEXED)  URINE CULTURE  I-STAT CG4 LACTIC ACID, ED    EKG EKG Interpretation  Date/Time:  Friday June 05 2018 02:39:55 EDT Ventricular Rate:  111 PR Interval:    QRS Duration: 102 QT Interval:  340 QTC Calculation: 462 R Axis:   -63 Text Interpretation:  Sinus tachycardia with irregular rate Left anterior fascicular block Probable anteroseptal infarct, old Confirmed by Orpah Greek [phone]) on 06/05/2018 3:39:06 AM   Radiology Ct Abdomen Pelvis Wo Contrast  Result Date: 06/05/2018 CLINICAL DATA:  Abdominal pain, fever.  Recent partial colectomy. EXAM: CT ABDOMEN AND PELVIS WITHOUT CONTRAST TECHNIQUE: Multidetector CT imaging of the abdomen and pelvis was performed following the standard protocol without IV contrast. COMPARISON:  05/30/2018 FINDINGS: Lower chest: Cardiomegaly.  Bibasilar atelectasis.  No effusions. Hepatobiliary: Small scattered hepatic cysts, small scattered hepatic low-density lesions throughout the liver, likely cysts although these cannot be characterized fully without intravenous contrast. Gallbladder unremarkable. Pancreas: No focal abnormality or ductal dilatation. Spleen: No focal abnormality.  Normal size. Adrenals/Urinary Tract: Numerous low-density, mixed density and high-density masses throughout the kidney are stable since recent study and cannot be characterized fully without intravenous contrast. At least 2 of these masses were shown on prior contrast enhanced CT to be suspicious for renal cell carcinoma. No hydronephrosis. Adrenal glands unremarkable. Urinary bladder decompressed and mildly thick walled, likely related to prostate enlargement. Stomach/Bowel: Descending colonic and sigmoid diverticulosis. Postoperative changes in the right colon  from right hemicolectomy, stable. Again seen is the ventral hernia containing a portion of the mid transverse colon which appears unremarkable. No evidence of obstruction or bowel wall thickening. Stomach and small bowel grossly unremarkable. No evidence of bowel obstruction. Vascular/Lymphatic: Aortic atherosclerosis. No enlarged abdominal or pelvic lymph nodes. Reproductive: Marked enlargement of the prostate. Other: No free fluid or free air. Continued wall thickening within the periphery of the midline supraumbilical ventral hernia which also contains the transverse colon. Again, no underlying bowel wall thickening. This has a stable appearance since prior study. Musculoskeletal: No acute bony abnormality. Diffuse degenerative changes in the lumbar spine. IMPRESSION: Stable appearance of prior right hemicolectomy. Stable supraumbilical ventral hernia containing a portion of the transverse colon. The wall of the ventral hernia again appears thickened and indistinct, but underlying bowel does not appear abnormal. Scattered diverticulosis.  No active diverticulitis. Marked prostate enlargement with bladder wall thickening. Multiple renal lesions. At least 2 of which were shown on prior contrast-enhanced CT to be suspicious for renal cell carcinoma. No change. Scattered hepatic low-density lesions cannot be characterized without intravenous contrast, likely cysts. Cardiomegaly, bibasilar atelectasis. Aortic atherosclerosis. Electronically Signed   By: Rolm Baptise M.D.   On: 06/05/2018 08:22   Dg Chest 2 View  Result Date: 06/05/2018 CLINICAL DATA:  Shortness of breath. EXAM: CHEST - 2 VIEW COMPARISON:  Chest CT 05/19/2018 FINDINGS: Chronic cardiomegaly and aortic tortuosity. No pulmonary edema, pleural effusion, focal airspace disease or pneumothorax. No acute osseous abnormalities. IMPRESSION: Cardiomegaly without congestive failure or acute chest finding. Electronically Signed   By: Keith Rake M.D.    On: 06/05/2018 03:21    Procedures Procedures (including critical  care time)  Medications Ordered in ED Medications  heparin injection 5,000 Units (5,000 Units Subcutaneous Given 06/06/18 0448)  0.9 %  sodium chloride infusion ( Intravenous New Bag/Given 06/06/18 0056)  acetaminophen (TYLENOL) tablet 650 mg (650 mg Oral Given 06/05/18 2102)    Or  acetaminophen (TYLENOL) suppository 650 mg ( Rectal See Alternative 06/05/18 2102)  HYDROcodone-acetaminophen (NORCO/VICODIN) 5-325 MG per tablet 1-2 tablet (has no administration in time range)  ondansetron (ZOFRAN) tablet 4 mg (has no administration in time range)    Or  ondansetron (ZOFRAN) injection 4 mg (has no administration in time range)  albuterol (PROVENTIL) (2.5 MG/3ML) 0.083% nebulizer solution 2.5 mg (has no administration in time range)  0.9 %  sodium chloride infusion ( Intravenous Hold 06/05/18 1040)  ceFEPIme (MAXIPIME) 1 g in sodium chloride 0.9 % 100 mL IVPB (has no administration in time range)  tamsulosin (FLOMAX) capsule 0.4 mg (0.4 mg Oral Given 06/05/18 2101)  senna-docusate (Senokot-S) tablet 2 tablet (has no administration in time range)  metoprolol tartrate (LOPRESSOR) injection 5 mg (5 mg Intravenous Given 06/06/18 0355)  sodium chloride 0.9 % bolus 1,000 mL (0 mLs Intravenous Stopped 06/05/18 0530)  acetaminophen (TYLENOL) tablet 650 mg (650 mg Oral Given 06/05/18 0534)  ceFEPIme (MAXIPIME) 2 g in sodium chloride 0.9 % 100 mL IVPB ( Intravenous Stopped 06/05/18 0833)  vancomycin (VANCOCIN) 2,000 mg in sodium chloride 0.9 % 500 mL IVPB (0 mg Intravenous Stopped 06/05/18 1149)  sodium chloride 0.9 % bolus 1,000 mL (0 mLs Intravenous Stopped 06/05/18 1148)     Initial Impression / Assessment and Plan / ED Course  I have reviewed the triage vital signs and the nursing notes.  Pertinent labs & imaging results that were available during my care of the patient were reviewed by me and considered in my medical decision making (see  chart for details).     Patient presents to the emergency department for evaluation of fever.  He reports that he had diarrhea through the course of the day and then tonight became weak, started having fever chills, chills, sweats.  Patient recently underwent right colectomy secondary to a partially obstructing ileocecal polyp.  He is not having significant abdominal pain, but intra-abdominal etiology of his fever is considered.  Patient did not have obvious pneumonia on x-ray.  Lactic acid was not elevated.  Patient has a history of chronic renal insufficiency, does appear to have an acute kidney injury on top of his chronic insufficiency.  This is likely secondary to the diarrhea and volume loss.  He also has some hypokalemia, secondary to the diarrhea.  Patient does have leukocytosis in addition to his fever.  Administer broad-spectrum antibiotics.  Patient signed out to oncoming ER physician to follow-up CT scan and urinalysis to help identify source of sepsis.  CRITICAL CARE Performed by: Orpah Greek   Total critical care time: 30 minutes  Critical care time was exclusive of separately billable procedures and treating other patients.  Critical care was necessary to treat or prevent imminent or life-threatening deterioration.  Critical care was time spent personally by me on the following activities: development of treatment plan with patient and/or surrogate as well as nursing, discussions with consultants, evaluation of patient's response to treatment, examination of patient, obtaining history from patient or surrogate, ordering and performing treatments and interventions, ordering and review of laboratory studies, ordering and review of radiographic studies, pulse oximetry and re-evaluation of patient's condition.   Final Clinical Impressions(s) / ED Diagnoses  Final diagnoses:  Sepsis, due to unspecified organism, unspecified whether acute organ dysfunction present [facility])    Acute cystitis with hematuria  AKI (acute kidney injury) [facility])    ED Discharge Orders    None       Orpah Greek, MD 06/06/18 [id number]    Orpah Greek, MD 06/23/18 [phone]

## 2018-06-05 NOTE — ED Notes (Signed)
Advised pt that we needed urine. Gave him a urine bottle said he would try.

## 2018-06-05 NOTE — ED Provider Notes (Signed)
8:25 AM Care assumed from Dr. Betsey Holiday.  At time of transfer care, patient is awaiting CT results and urinalysis to determine etiology of the patient's fever.  Patient had recent right colectomy and has had fever for 1 day.  Patient reported some shortness of breath.  Patient also had results of blood work showing acute kidney injury, leukocytosis, and had vital signs concerning for sepsis.  Patient was given broad-spectrum antibiotics initially.  CT scan showed stable appearance of the hemicolectomy.  Stable findings otherwise with no diverticulitis.  Patient is to awaiting urinalysis however if urine is clear, anticipate admission for fever and sepsis of unknown origin.  10:04 AM Patient's urinalysis showed protein, leukocytes and bacteria.  Given the patient's report of foul-smelling urine and decreased urine I am concerned about UTI in the setting of the sepsis.  Next  Patient already received broad-spectrum antibiotic however his pressure has down trended into the upper 90s.  He was given more fluids.    Due to the patient's acute kidney injury with a creatinine of 3.7, sepsis from urinary tract infection, and likely dehydration due to decreased oral intake and diarrhea, patient will be admitted for rehydration and antibiotic management.   Clinical Impression: 1. Sepsis, due to unspecified organism, unspecified whether acute organ dysfunction present ([place])   2. Acute cystitis with hematuria   3. AKI (acute kidney injury) ([place])     Disposition: Admit  This note was prepared with assistance of Dragon voice recognition software. Occasional wrong-word or sound-a-like substitutions may have occurred due to the inherent limitations of voice recognition software.     Tegeler, Gwenyth Allegra, MD 06/05/18 [phone]

## 2018-06-05 NOTE — ED Notes (Signed)
Admitting MD at bedside.

## 2018-06-05 NOTE — H&P (Addendum)
TRH H&P   Patient Demographics:    [name] [name], is a 72 y.o. male  MRN: [id number]   DOB - [birth year]  Admit Date - 06/05/2018  Outpatient Primary MD for the patient is Nolene Ebbs, MD  Referring MD/NP/PA: Dr Gustavus Messing  Patient coming from: home  Chief Complaint  Patient presents with  . Fever      HPI:    [name] [name]  is a 72 y.o. male, past medical history of CAD, hypertension, hyperlipidemia, A. 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 -In ED work-up significant for fever, tachycardia, soft blood pressure, leukocytosis, worsening renal function, ct abd /pelvis with no source of infection, he has positive urine analysis, blood cultures were obtained in ED, urine culture still pending, he responded to fluid boluses, I was called to admit.    Review of systems:    In addition to the HPI above,  Fever and chills, generalized weakness, loss of appetite No Headache, No changes with Vision or hearing, No problems swallowing food or Liquids, No Chest pain, Cough or Shortness of Breath, No Abdominal pain, No Nausea or Vommitting, reports diarrhea Blood in stool or urine Reports difficulty urinating No new skin rashes or bruises, No new joints pains-aches,  No new weakness, tingling, numbness in any extremity, No recent weight gain or loss, No polyuria, polydypsia or polyphagia, No significant  Mental Stressors.  A full 10 point Review of Systems was done, except as stated above, all other Review of Systems were negative.   With Past History of the following :    Past Medical History:  Diagnosis Date  . Abnormal LFTs (liver function tests)   . Anxiety   . Aortic root dilatation ([place])    a. 12/2015 Echo: mod dil of sinus of valsalva and Ao root (42m).  b. 53 mm Echo 2018  . BPH with urinary obstruction   . Bradycardia   . 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 CAD (coronary artery disease)    a. 12/2015 NSTEMI/Cath (in setting of PAF):  LM nl, LAD 30p,  LCX 334mRCA  ok, AM 100%, RPDA1 40, RPDA 2 60 ->Med Rx.  . Cervical pain 12/09/2014  . Childhood asthma   . Chronic diastolic CHF (congestive heart failure) ([facility]   a. 12/2015 Echo: EF 50-55%, mod AI, mod Ao root dil, mild MR, mod dil LA, mod RA.  .[name]  COPD (chronic obstructive pulmonary disease) ([place])    pt denies at preop  . Diabetes mellitus type II    diet controlled  . History of syncope   . Hyperlipidemia   . Hypertensive heart disease   . Kidney lump 04/04/2010   Overview:  Renal Cell Carcinoma   . Moderate aortic insufficiency    a. 12/2015 Echo: Mod AI.  [name] Overactive bladder   . Paroxysmal atrial fibrillation ([place])    a. 12/2015 started on Xarelto (CHA2DS2VASc = 4-5).  . Pneumonia   . Prostate cancer ([place])   . Renal cell carcinoma ([place])   . Renal lesion   . Sleep apnea    Does not like  CPAP  . Spinal stenosis in cervical region 10/12/2016      Past Surgical History:  Procedure Laterality Date  . BACK SURGERY    . CARDIAC CATHETERIZATION N/A 12/18/2015   Procedure: Left Heart Cath and Coronary Angiography;  Surgeon: Leonie Man, MD;  Location: [facility];  Service: Cardiovascular;  Laterality: N/A;  . DIAGNOSTIC LAPAROSCOPY     partial colectomy  . LAPAROSCOPIC PARTIAL COLECTOMY Right 05/21/2018   Procedure:  LAPAROSCOPIC RIGHT  COLECTOMY ERAS PATHWAY;  Surgeon: Leighton Ruff, MD;  Location: [facility];  Service: General;  Laterality: Right;  . [place]   "replaced a disc"      Social History:     Social History   Tobacco Use  . Smoking status: Former Smoker    Types: Cigars    Last attempt to quit: 09/02/1977    Years since quitting: 40.7  . Smokeless tobacco: Never Used  . Tobacco comment:    Substance Use Topics  . Alcohol use: Not Currently    Comment: "stopped drinking alcohol in ~ 1980; just drank a little on the weekends when I did drink"     Lives - at home, and daughter lives with him  Mobility -with assistance     Family History :     Family History  Problem Relation Age of Onset  . Emphysema Father   . Asthma Father   . Liver disease Father        tumor  . Heart disease Mother   . Hypertension Mother   . Asthma Sister   . Hypertension Son       Home Medications:   Prior to Admission medications   Medication Sig Start Date End Date Taking? Authorizing Provider  albuterol (PROVENTIL HFA;VENTOLIN HFA) 108 (90 Base) MCG/ACT inhaler Inhale 2 puffs into the lungs every 6 (six) hours as needed for wheezing or shortness of breath. 10/18/15  Yes Burns, Alexa R, MD  albuterol (PROVENTIL) (5 MG/ML) 0.5% nebulizer solution Take 0.5 mLs (2.5 mg total) by nebulization every 6 (six) hours as needed for wheezing or shortness of breath. 10/18/15  Yes Burns, Alexa R, MD  amLODipine (NORVASC) 10 MG tablet Take 10 mg by mouth 2 (two) times daily.    Yes [provider]  atorvastatin (LIPITOR) 80 MG tablet Take 1 tablet (80 mg total) by mouth daily. Patient taking differently: Take 40 mg by mouth 2 (two) times daily.  04/14/18 07/13/18 Yes Hochrein, Jeneen Rinks, MD  KLOR-CON M20 20 MEQ tablet TAKE 1 TABLET BY MOUTH TWICE A DAY Patient taking differently: Take 20 mEq by mouth daily.  04/08/18  Yes Minus Breeding, MD  tamsulosin (FLOMAX) 0.4 MG CAPS capsule Take 1  capsule (0.4 mg total) by mouth daily. 05/26/18  Yes Leighton Ruff, MD     Allergies:     Allergies  Allergen Reactions  . Amoxicillin Other (See Comments)    Can't move Has patient had a PCN reaction causing immediate rash, facial/tongue/throat swelling, SOB or lightheadedness with hypotension: No Has patient had a PCN reaction causing severe rash involving mucus membranes or skin necrosis: No Has patient had a PCN reaction that required hospitalization No Has patient had a PCN reaction occurring within the last 10 years: No If all of the above answers are "NO", then may proceed with Cephalosporin use.   [name] Penicillins Other (See Comments)    Can't move/dizziness Has patient had a PCN reaction causing immediate rash, facial/tongue/throat swelling, SOB or lightheadedness with hypotension: No Has patient had a PCN reaction causing severe rash involving mucus membranes or skin necrosis: No Has patient had a PCN reaction that required hospitalization No Has patient had a PCN reaction occurring within the last 10 years: No If all of the above answers are "NO", then may proceed with Cephalosporin use.  Other reaction(s): Other Can't move Can't move      Physical Exam:   Vitals  Blood pressure 113/79, pulse (!) 58, temperature 98.9 F (37.2 C), temperature source Oral, resp. rate (!) 21, height 5' 9"  (1.753 m), weight 99.8 kg, SpO2 98 %.   1. General developed male, laying in bed in no apparent distress  2. Normal affect and insight, Not Suicidal or Homicidal, Awake Alert, Oriented X 3.  3. No F.N deficits, ALL C.Nerves Intact, Strength 5/5 all 4 extremities, sensation intact all 4 extremities, Plantars down going.  4. Ears appear Normal, has exophthalmos, conjunctivae clear, PERRLA. dry Oral Mucosa.  5. Supple Neck, No JVD, No cervical lymphadenopathy appriciated, No Carotid Bruits.  6. Symmetrical Chest wall movement, Good air movement bilaterally, CTAB.  7.  Irregular  irregular, No Gallops, Rubs , No Parasternal Heave.  8. Positive Bowel Sounds, Abdomen Soft, midline surgical scar has healed nicely, no tenderness, No organomegaly appriciated,No rebound -guarding or rigidity.  9.  No Cyanosis, delayed Skin Turgor, No Skin Rash or Bruise.  10. Good muscle tone,  joints appear normal , no effusions, Normal ROM.  11. No Palpable Lymph Nodes in Neck or Axillae     Data Review:    CBC Recent Labs  Lab 05/30/18 0947 06/05/18 0258  WBC 6.4 13.4*  HGB 8.7* 8.3*  HCT 26.0* 25.8*  PLT 289 302  MCV 96.3 98.1  MCH 32.2 31.6  MCHC 33.5 32.2  RDW 13.1 12.5  LYMPHSABS 1.4 0.5*  MONOABS 0.9 0.3  EOSABS 0.1 0.2  BASOSABS 0.0 0.0   ------------------------------------------------------------------------------------------------------------------  Chemistries  Recent Labs  Lab 05/30/18 0947 06/05/18 0258  NA 142 135  K 3.3* 3.2*  CL 104 99  CO2 27 22  GLUCOSE 98 146*  BUN 53* 51*  CREATININE 3.13* 3.72*  CALCIUM 9.0 8.8*  AST 21 26  ALT 21 18  ALKPHOS 78 78  BILITOT 0.8 1.7*   ------------------------------------------------------------------------------------------------------------------ estimated creatinine clearance is 20.3 mL/min (A) (by C-G formula based on SCr of 3.72 mg/dL (H)). ------------------------------------------------------------------------------------------------------------------ No results for input(s): TSH, T4TOTAL, T3FREE, THYROIDAB in the last 72 hours.  Invalid input(s): FREET3  Coagulation profile No results for input(s): INR, PROTIME in the last 168 hours. ------------------------------------------------------------------------------------------------------------------- No results for input(s): DDIMER in the last 72 hours. -------------------------------------------------------------------------------------------------------------------  Cardiac Enzymes No results for input(s): CKMB, TROPONINI, MYOGLOBIN in  the last 168 hours.  Invalid input(s): CK ------------------------------------------------------------------------------------------------------------------  Component Value Date/Time   BNP 79.7 12/26/2015 0203     ---------------------------------------------------------------------------------------------------------------  Urinalysis    Component Value Date/Time   COLORURINE YELLOW 06/05/2018 0723   APPEARANCEUR TURBID (A) 06/05/2018 0723   LABSPEC 1.020 06/05/2018 0723   PHURINE 6.0 06/05/2018 0723   GLUCOSEU NEGATIVE 06/05/2018 0723   HGBUR MODERATE (A) 06/05/2018 0723   BILIRUBINUR NEGATIVE 06/05/2018 0723   KETONESUR NEGATIVE 06/05/2018 0723   PROTEINUR 100 (A) 06/05/2018 0723   NITRITE NEGATIVE 06/05/2018 0723   LEUKOCYTESUR SMALL (A) 06/05/2018 0723    ----------------------------------------------------------------------------------------------------------------   Imaging Results:    Ct Abdomen Pelvis Wo Contrast  Result Date: 06/05/2018 CLINICAL DATA:  Abdominal pain, fever.  Recent partial colectomy. EXAM: CT ABDOMEN AND PELVIS WITHOUT CONTRAST TECHNIQUE: Multidetector CT imaging of the abdomen and pelvis was performed following the standard protocol without IV contrast. COMPARISON:  05/30/2018 FINDINGS: Lower chest: Cardiomegaly.  Bibasilar atelectasis.  No effusions. Hepatobiliary: Small scattered hepatic cysts, small scattered hepatic low-density lesions throughout the liver, likely cysts although these cannot be characterized fully without intravenous contrast. Gallbladder unremarkable. Pancreas: No focal abnormality or ductal dilatation. Spleen: No focal abnormality.  Normal size. Adrenals/Urinary Tract: Numerous low-density, mixed density and high-density masses throughout the kidney are stable since recent study and cannot be characterized fully without intravenous contrast. At least 2 of these masses were shown on prior contrast enhanced CT to be suspicious for  renal cell carcinoma. No hydronephrosis. Adrenal glands unremarkable. Urinary bladder decompressed and mildly thick walled, likely related to prostate enlargement. Stomach/Bowel: Descending colonic and sigmoid diverticulosis. Postoperative changes in the right colon from right hemicolectomy, stable. Again seen is the ventral hernia containing a portion of the mid transverse colon which appears unremarkable. No evidence of obstruction or bowel wall thickening. Stomach and small bowel grossly unremarkable. No evidence of bowel obstruction. Vascular/Lymphatic: Aortic atherosclerosis. No enlarged abdominal or pelvic lymph nodes. Reproductive: Marked enlargement of the prostate. Other: No free fluid or free air. Continued wall thickening within the periphery of the midline supraumbilical ventral hernia which also contains the transverse colon. Again, no underlying bowel wall thickening. This has a stable appearance since prior study. Musculoskeletal: No acute bony abnormality. Diffuse degenerative changes in the lumbar spine. IMPRESSION: Stable appearance of prior right hemicolectomy. Stable supraumbilical ventral hernia containing a portion of the transverse colon. The wall of the ventral hernia again appears thickened and indistinct, but underlying bowel does not appear abnormal. Scattered diverticulosis.  No active diverticulitis. Marked prostate enlargement with bladder wall thickening. Multiple renal lesions. At least 2 of which were shown on prior contrast-enhanced CT to be suspicious for renal cell carcinoma. No change. Scattered hepatic low-density lesions cannot be characterized without intravenous contrast, likely cysts. Cardiomegaly, bibasilar atelectasis. Aortic atherosclerosis. Electronically Signed   By: Rolm Baptise M.D.   On: 06/05/2018 08:22   Dg Chest 2 View  Result Date: 06/05/2018 CLINICAL DATA:  Shortness of breath. EXAM: CHEST - 2 VIEW COMPARISON:  Chest CT 05/19/2018 FINDINGS: Chronic  cardiomegaly and aortic tortuosity. No pulmonary edema, pleural effusion, focal airspace disease or pneumothorax. No acute osseous abnormalities. IMPRESSION: Cardiomegaly without congestive failure or acute chest finding. Electronically Signed   By: Keith Rake M.D.   On: 06/05/2018 03:21    My personal review of EKG: Rhythm NSR, Rate  111 /min, QTc 462 , no Acute ST changes   Assessment & Plan:    Active Problems:   Benign prostatic hyperplasia without urinary obstruction   Diabetes ([place])  CAD (coronary artery disease)   Chronic diastolic CHF (congestive heart failure) ([facility])   Hypertensive heart disease   Sepsis ([place])   Acute lower UTI  Sepsis secondary to UTI -Presents with leukocytosis, soft blood pressure, tachycardia and fever, and evidence of organ damage with worsening renal function, sepsis criteria met on admission. -Blood pressure soft initially, but started to respond to fluid, will continue with IV fluids, follow blood cultures, will obtain urine culture, for now continue with IV cefepime, pharmacy to dose, oral antibiotic once urine culture results are obtained. -Patient report some diarrhea, but abdominal exam is benign, follow on C. difficile results.  AKI on CKD stage III -Patient renal function has been gradually increasing over last few weeks, most recent was 3.1, it is 3.7 today, will hold nephrotoxic medication, this is most likely in the setting of volume depletion and dehydration given poor oral intake, and diarrhea, continue with IV fluids.  Renal mass -CT obtained today showing evidence of renal mass, suspicious for malignancy, he has been following with Dr. Louis Meckel as an outpatient urology, I have discussed with Dr. Louis Meckel, they are following it regularly, and his plan for MRI with renal function improves .   History of A. Fib -He appears to be heart rate controlled, have discussed with the patient on anticoagulation, report he stopped taking Xarelto few  months ago, I have discussed recommendation to resume anticoagulation , I have explained increased risk of strokes, and benefit of anticoagulation, but he declined, he was adamant about it, he is agreeable only to take baby aspirin.  Recent laparoscopic right colectomy continue to cecal mass with pathology showing  invasive adenocarcinoma -abdomen  exam is benign, he is following with Dr. Trish Fountain  BPH -Continue with home meds  Hypertension -Given soft blood pressure I will hold his meds  Chronic diastolic CHF -Patient appears to be dry, will continue with gentle hydration, and will monitor volume status closely  History of CAD -Patient currently denies any chest pain or shortness of breath, will start an aspirin, continue with statin      DVT Prophylaxis Heparin -   SCDs   AM Labs Ordered, also please review Full Orders  Family Communication: Admission, patients condition and plan of care including tests being ordered have been discussed with the patient  who indicate understanding and agree with the plan and Code Status.  Code Status Full  Likely DC to Home  Condition GUARDED   Consults called: none  Admission status: full  Time spent in minutes : 60 minutes   Phillips Climes M.D on 06/05/2018 at 10:42 AM  Between 7am to 7pm - Pager - [phone]. After 7pm go to [email or web address] - password [facility]  [facility] - Office  [phone]

## 2018-06-06 ENCOUNTER — Inpatient Hospital Stay (HOSPITAL_COMMUNITY): Payer: Medicare Other

## 2018-06-06 ENCOUNTER — Other Ambulatory Visit (HOSPITAL_COMMUNITY): Payer: Medicare Other

## 2018-06-06 LAB — BLOOD CULTURE ID PANEL (REFLEXED)
Acinetobacter baumannii: NOT DETECTED
CANDIDA KRUSEI: NOT DETECTED
CANDIDA PARAPSILOSIS: NOT DETECTED
Candida albicans: NOT DETECTED
Candida glabrata: NOT DETECTED
Candida tropicalis: NOT DETECTED
ENTEROCOCCUS SPECIES: NOT DETECTED
Enterobacter cloacae complex: NOT DETECTED
Enterobacteriaceae species: NOT DETECTED
Escherichia coli: NOT DETECTED
HAEMOPHILUS INFLUENZAE: NOT DETECTED
Klebsiella oxytoca: NOT DETECTED
Klebsiella pneumoniae: NOT DETECTED
LISTERIA MONOCYTOGENES: NOT DETECTED
Neisseria meningitidis: NOT DETECTED
PROTEUS SPECIES: NOT DETECTED
PSEUDOMONAS AERUGINOSA: NOT DETECTED
SERRATIA MARCESCENS: NOT DETECTED
STAPHYLOCOCCUS AUREUS BCID: NOT DETECTED
STAPHYLOCOCCUS SPECIES: NOT DETECTED
STREPTOCOCCUS PNEUMONIAE: NOT DETECTED
STREPTOCOCCUS PYOGENES: NOT DETECTED
STREPTOCOCCUS SPECIES: NOT DETECTED
Streptococcus agalactiae: NOT DETECTED

## 2018-06-06 LAB — URINALYSIS, MICROSCOPIC (REFLEX)

## 2018-06-06 LAB — BASIC METABOLIC PANEL
Anion gap: 13 (ref 5–15)
BUN: 56 mg/dL — AB (ref 8–23)
CALCIUM: 8.6 mg/dL — AB (ref 8.9–10.3)
CHLORIDE: 103 mmol/L (ref 98–111)
CO2: 21 mmol/L — AB (ref 22–32)
CREATININE: 4.84 mg/dL — AB (ref 0.61–1.24)
GFR calc Af Amer: 12 mL/min — ABNORMAL LOW (ref >60)
GFR calc non Af Amer: 11 mL/min — ABNORMAL LOW (ref >60)
GLUCOSE: 103 mg/dL — AB (ref 70–99)
Potassium: 3.4 mmol/L — ABNORMAL LOW (ref 3.5–5.1)
Sodium: 137 mmol/L (ref 135–145)

## 2018-06-06 LAB — CBC
HCT: 24.9 % — ABNORMAL LOW (ref 39.0–52.0)
Hemoglobin: 8 g/dL — ABNORMAL LOW (ref 13.0–17.0)
MCH: 31.6 pg (ref 26.0–34.0)
MCHC: 32.1 g/dL (ref 30.0–36.0)
MCV: 98.4 fL (ref 78.0–100.0)
PLATELETS: 288 10*3/uL (ref 150–400)
RBC: 2.53 MIL/uL — ABNORMAL LOW (ref 4.22–5.81)
RDW: 12.5 % (ref 11.5–15.5)
WBC: 8.6 10*3/uL (ref 4.0–10.5)

## 2018-06-06 MED ORDER — SODIUM CHLORIDE 0.9 % IV SOLN
1.0000 g | Freq: Two times a day (BID) | INTRAVENOUS | Status: DC
  Administered 2018-06-06: 1 g via INTRAVENOUS
  Administered 2018-06-06: 23:00:00 via INTRAVENOUS
  Administered 2018-06-07: 1 g via INTRAVENOUS
  Filled 2018-06-06 (×3): qty 1

## 2018-06-06 MED ORDER — LIDOCAINE HCL URETHRAL/MUCOSAL 2 % EX GEL
1.0000 "application " | Freq: Once | CUTANEOUS | Status: AC
  Administered 2018-06-06: 1 via URETHRAL
  Filled 2018-06-06 (×2): qty 5

## 2018-06-06 MED ORDER — METOPROLOL TARTRATE 5 MG/5ML IV SOLN
5.0000 mg | INTRAVENOUS | Status: DC | PRN
  Administered 2018-06-06: 5 mg via INTRAVENOUS
  Filled 2018-06-06: qty 5

## 2018-06-06 MED ORDER — FINASTERIDE 5 MG PO TABS
5.0000 mg | ORAL_TABLET | Freq: Every day | ORAL | Status: DC
  Administered 2018-06-06 – 2018-06-16 (×11): 5 mg via ORAL
  Filled 2018-06-06 (×12): qty 1

## 2018-06-06 MED ORDER — POTASSIUM CHLORIDE CRYS ER 20 MEQ PO TBCR
40.0000 meq | EXTENDED_RELEASE_TABLET | Freq: Once | ORAL | Status: AC
  Administered 2018-06-06: 40 meq via ORAL
  Filled 2018-06-06: qty 2

## 2018-06-06 NOTE — Progress Notes (Signed)
New order received from [facility] NP for Metoprolol 5 mg IV PRN as indicated in the [facility]. Manual BP=140/86 before IV Metoprolol.  BP=122/81 after the Metoprolol. Heart rate is controlled now. Patient was put on 2L/[place] for de sating in the 80s on R/A  while sleeping. Will continue to moniotr

## 2018-06-06 NOTE — Progress Notes (Signed)
16 french coude cath inserted without problems or complaint of discomfort, from patient. Uncircumcised penis, foreskin returned to original position after catheter inserted. Clear, straw colored urine returned without odor. Patrici Ranks A

## 2018-06-06 NOTE — Progress Notes (Signed)
Patient post void baldder scan x 2. See below:  Time: 1045 Void = 45mL Post void Scan = 314 mL  Time: 1245 Void = 200 mL Post void Scan = 322 mL  Notified MD of findings. Will continue to monitor. Eliezer Champagne RN

## 2018-06-06 NOTE — Evaluation (Signed)
Physical Therapy Evaluation Patient Details Name: [name] [name] MRN: [id number] DOB: Aug 12, 1946 Today's Date: 06/06/2018   History of Present Illness   [name] [name]  is a 72 y.o. male, past medical history of CAD, hypertension, hyperlipidemia, A. 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 Pt found to have soft BP, tachycardia, UTI, and worsening renal function in ED.   Clinical Impression  Pt admitted with above diagnosis. Pt currently with functional limitations due to the deficits listed below (see PT Problem List). Eval limited by pt nausea and mild dizziness. Min A to stand from bed and ambulate 10'. Mild balance impairment noted.  Pt will benefit from skilled PT to increase their independence and safety with mobility to allow discharge to the venue listed below.       Follow Up Recommendations Home health PT;Supervision for mobility/OOB    Equipment Recommendations  Rolling walker with 5" wheels    Recommendations for Other Services       Precautions / Restrictions Precautions Precautions: Fall Precaution Comments: pt has been having some dizziness Restrictions Weight Bearing Restrictions: No      Mobility  Bed Mobility Overal bed mobility: Modified Independent                Transfers Overall transfer level: Needs assistance Equipment used: 1 person hand held assist Transfers: Sit to/from Stand Sit to Stand: Min assist         General transfer comment: pt having some dizziness, HHA given for safety, dizziness did not worsen with standing  Ambulation/Gait Ambulation/Gait assistance:  Min guard Gait Distance (Feet): 10 Feet Assistive device: None Gait Pattern/deviations: Wide base of support;Step-through pattern Gait velocity: decreased Gait velocity interpretation: <1.8 ft/sec, indicate of risk for recurrent falls General Gait Details: gait distance limited since pt not feeling well, was reaching for foot of bed for support, discussed the use of RW for longer distances  Stairs            Wheelchair Mobility    Modified Rankin (Stroke Patients Only)       Balance Overall balance assessment: Needs assistance Sitting-balance support: No upper extremity supported Sitting balance-Leahy Scale: Good     Standing balance support: Single extremity supported Standing balance-Leahy Scale: Fair                 High Level Balance Comments: pt able to maintain standing balance to lean fwd and use urinal with wide BOS             Pertinent Vitals/Pain Pain Assessment: No/denies pain(has some nausea)    Home Living Family/patient expects to be discharged to:: Private residence Living Arrangements: Children Available Help at Discharge: Family;Available 24 hours/day Type of Home: Apartment Home Access: Level entry     Home Layout: One level Home Equipment: None Additional Comments: pt reports that granddaughter has been staying with him for a little over a year    Prior Function Level of Independence: Independent         Comments: granddaughter was helping with IADLS but pt was independent with ADL's and reports he was still driving     Hand Dominance   Dominant Hand: Right  Extremity/Trunk Assessment   Upper Extremity Assessment Upper Extremity Assessment: Overall WFL for tasks assessed    Lower Extremity Assessment Lower Extremity Assessment: Overall WFL for tasks assessed    Cervical / Trunk Assessment Cervical / Trunk Assessment: Normal  Communication   Communication: No difficulties  Cognition Arousal/Alertness:  Awake/alert Behavior During Therapy: WFL for tasks assessed/performed Overall Cognitive Status: No family/caregiver present to determine baseline cognitive functioning                                 General Comments: mild STM deficits noted on eval      General Comments General comments (skin integrity, edema, etc.): Pt WFL on MMT but fatigues quickly and reports that he feels generally weak    Exercises     Assessment/Plan    PT Assessment Patient needs continued PT services  PT Problem List Decreased activity tolerance;Decreased balance;Decreased mobility;Decreased knowledge of use of DME       PT Treatment Interventions DME instruction;Gait training;Functional mobility training;Therapeutic activities;Therapeutic exercise;Balance training;Patient/family education    PT Goals (Current goals can be found in the Care Plan section)  Acute Rehab PT Goals Patient Stated Goal: feel better PT Goal Formulation: With patient Time For Goal Achievement: 06/20/18 Potential to Achieve Goals: Good    Frequency Min 3X/week   Barriers to discharge        Co-evaluation               AM-PAC PT "6 Clicks" Daily Activity  Outcome Measure Difficulty turning over in bed (including adjusting bedclothes, sheets and blankets)?: None Difficulty moving from lying on back to sitting on the side of the bed? : None Difficulty sitting down on and standing up from a chair with arms (e.g., wheelchair, bedside commode, etc,.)?: A Little Help needed moving to and from a bed to chair (including a wheelchair)?: A Little Help needed walking in hospital room?: A Little Help needed climbing 3-5 steps with a railing? : A Lot 6 Click Score: 19    End of Session Equipment Utilized During Treatment: Gait belt Activity Tolerance: Patient limited by fatigue Patient left: in chair;with call bell/phone within reach;with chair alarm set Nurse Communication: Mobility status PT Visit  Diagnosis: Dizziness and giddiness (R42);Other abnormalities of gait and mobility (R26.89)    Time: 3299-2426 PT Time Calculation (min) (ACUTE ONLY): 22 min   Charges:   PT Evaluation $PT Eval Moderate Complexity: [place]  Pager [phone] Office [place] 06/06/2018, 2:19 PM

## 2018-06-06 NOTE — Progress Notes (Signed)
PROGRESS NOTE        PATIENT DETAILS Name: [name] [name] Age: 72 y.o. Sex: male Date of Birth: 1946/01/01 Admit Date: 06/05/2018 Admitting Physician Albertine Patricia, MD IRC:[id number], Christean Grief, MD  Brief Narrative: Patient is a 72 y.o. male with history of CAD, hypertension, PAF not on anticoagulation-recent right hemicolectomy for a cecal mass-just discharged by the general surgical service on 1/75-ZWCHENID course was complicated by development of obstructive uropathy and AKI ( discharged with a Foley catheter, successful outpatient voiding trial on 10/1)-presented to the hospital with sepsis secondary to UTI, acute kidney injury.  See below for further details.  Subjective: Feels better-no nausea vomiting.  Denies any chest pain.  Assessment/Plan: Sepsis secondary to UTI and gram-negative bacteremia: Probably related to recent catheter placement-sepsis pathophysiology has improved-continue cefepime.  1/2 blood cultures growing gram-negative rods-await final culture data.  .  AKI on CKD stage III: AKI probably hemodynamically mediated-but worried about obstructive uropathy given worsening creatinine and recent history of urinary retention requiring Foley catheter placement (per patient discontinued on 10/1 by urology as an outpatient).  CT scan of the abdomen did not show any hydronephrosis-Per history patient voiding well-we will ask nursing staff to check postvoid residuals-if these are significantly elevated-will probably require Foley catheter placement.  Discussed with patient-he is not keen on getting a Foley catheter reinserted at this time.  Avoid nephrotoxic agents-supportive care-follow renal function and urine output closely.  If renal function worsens further-will require nephrology evaluation.  Hypokalemia: Replete and recheck  BPH with recent urinary retention/obstructive uropathy requiring Foley catheter placement: Continue Flomax-add finasteride-see above  regarding concern for ongoing urinary retention-and need for frequent bladder scans.  Chronic diastolic heart failure: Euvolemic-monitor volume status closely.  Hypertension: Continue to hold all antihypertensives-blood pressure improved-but does not require antihypertensives at this point.  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  DVT Prophylaxis: Prophylactic Heparin   Code Status: Full code  Family Communication: None at bedside  Disposition Plan: Remain inpatient  Antimicrobial agents: Anti-infectives (From admission, onward)   Start     Dose/Rate Route Frequency Ordered Stop   06/06/18 0800  ceFEPIme (MAXIPIME) 1 g in sodium chloride 0.9 % 100 mL IVPB     1 g 200 mL/hr over 30 Minutes Intravenous Every 24 hours 06/05/18 1056     06/05/18 0900  vancomycin (VANCOCIN) 2,000 mg in sodium chloride 0.9 % 500 mL IVPB     2,000 mg 250 mL/hr over 120 Minutes Intravenous  Once 06/05/18 0816 06/05/18 1149   06/05/18 0730  ceFEPIme (MAXIPIME) 2 g in sodium chloride 0.9 % 100 mL IVPB     2 g 200 mL/hr over 30 Minutes Intravenous  Once 06/05/18 0720 06/05/18 0833   06/05/18 0730  metroNIDAZOLE (FLAGYL) IVPB 500 mg  Status:  Discontinued     500 mg 100 mL/hr over 60 Minutes Intravenous Every 8 hours 06/05/18 0720 06/05/18 1035   06/05/18 0730  vancomycin (VANCOCIN) IVPB 1000 mg/200 mL premix  Status:  Discontinued     1,000 mg 200 mL/hr over 60 Minutes Intravenous  Once 06/05/18 0720 06/05/18 0816      Procedures: None  CONSULTS:  None  Time spent: 35 minutes-Greater than 50% of this time was spent in counseling, explanation of diagnosis, planning of  further management, and coordination of care.  MEDICATIONS: Scheduled Meds: . heparin  5,000 Units Subcutaneous Q8H  . tamsulosin  0.4 mg Oral QHS   Continuous Infusions: . sodium chloride  125 mL/hr at 06/06/18 0946  . ceFEPime (MAXIPIME) IV 1 g (06/06/18 0948)   PRN Meds:.acetaminophen **OR** acetaminophen, albuterol, HYDROcodone-acetaminophen, metoprolol tartrate, ondansetron **OR** ondansetron (ZOFRAN) IV, senna-docusate   PHYSICAL EXAM: Vital signs: Vitals:   06/06/18 0353 06/06/18 0403 06/06/18 0600 06/06/18 0949  BP: 140/86 122/81  129/86  Pulse:  71  71  Resp:  (!) 24  18  Temp: 98.7 F (37.1 C)  99.1 F (37.3 C) 98.1 F (36.7 C)  TempSrc: Oral  Oral Oral  SpO2:  100%  97%  Weight:      Height:       Filed Weights   06/05/18 0256 06/05/18 1251  Weight: 99.8 kg 99.8 kg   Body mass index is 35.51 kg/m.   General appearance :Awake, alert, not in any distress. Speech Clear. Not toxic Looking Eyes:, pupils equally reactive to light and accomodation,no scleral icterus.Pink conjunctiva HEENT: Atraumatic and Normocephalic Neck: supple, no JVD. No cervical lymphadenopathy. No thyromegaly Resp:Good air entry bilaterally, no added sounds  CVS: S1 S2 regular, no murmurs.  GI: Bowel sounds present, Non tender and not distended with no gaurding, rigidity or rebound.No organomegaly Extremities: B/L Lower Ext shows trace edema, both legs are warm to touch Neurology:  speech clear,Non focal, sensation is grossly intact. Psychiatric: Normal judgment and insight. Alert and oriented x 3. Normal mood. Musculoskeletal:No digital cyanosis Skin:No Rash, warm and dry Wounds:N/A  I have personally reviewed following labs and imaging studies  LABORATORY DATA: CBC: Recent Labs  Lab 06/05/18 0258 06/06/18 0330  WBC 13.4* 8.6  NEUTROABS 12.5*  --   HGB 8.3* 8.0*  HCT 25.8* 24.9*  MCV 98.1 98.4  PLT 302 025    Basic Metabolic Panel: Recent Labs  Lab 06/05/18 0258 06/06/18 0330  NA 135 137  K 3.2* 3.4*  CL 99 103  CO2 22 21*  GLUCOSE 146* 103*  BUN 51* 56*  CREATININE 3.72* 4.84*  CALCIUM 8.8* 8.6*    GFR: Estimated Creatinine Clearance: 14.8 mL/min  (A) (by C-G formula based on SCr of 4.84 mg/dL (H)).  Liver Function Tests: Recent Labs  Lab 06/05/18 0258  AST 26  ALT 18  ALKPHOS 78  BILITOT 1.7*  PROT 6.8  ALBUMIN 3.2*   No results for input(s): LIPASE, AMYLASE in the last 168 hours. No results for input(s): AMMONIA in the last 168 hours.  Coagulation Profile: No results for input(s): INR, PROTIME in the last 168 hours.  Cardiac Enzymes: No results for input(s): CKTOTAL, CKMB, CKMBINDEX, TROPONINI in the last 168 hours.  BNP (last 3 results) No results for input(s): PROBNP in the last 8760 hours.  HbA1C: No results for input(s): HGBA1C in the last 72 hours.  CBG: No results for input(s): GLUCAP in the last 168 hours.  Lipid Profile: No results for input(s): CHOL, HDL, LDLCALC, TRIG, CHOLHDL, LDLDIRECT in the last 72 hours.  Thyroid Function Tests: No results for input(s): TSH, T4TOTAL, FREET4, T3FREE, THYROIDAB in the last 72 hours.  Anemia Panel: No results for input(s): VITAMINB12, FOLATE, FERRITIN, TIBC, IRON, RETICCTPCT in the last 72 hours.  Urine analysis:    Component Value Date/Time   COLORURINE YELLOW 06/05/2018 0723   APPEARANCEUR TURBID (A) 06/05/2018 0723   LABSPEC 1.020 06/05/2018 0723   PHURINE 6.0 06/05/2018 4270  GLUCOSEU NEGATIVE 06/05/2018 0723   HGBUR MODERATE (A) 06/05/2018 0723   BILIRUBINUR NEGATIVE 06/05/2018 0723   KETONESUR NEGATIVE 06/05/2018 0723   PROTEINUR 100 (A) 06/05/2018 0723   NITRITE NEGATIVE 06/05/2018 0723   LEUKOCYTESUR SMALL (A) 06/05/2018 0723    Sepsis Labs: Lactic Acid, Venous    Component Value Date/Time   LATICACIDVEN 1.63 06/05/2018 0310    MICROBIOLOGY: Recent Results (from the past 240 hour(s))  Urine culture     Status: None   Collection Time: 05/30/18 10:56 AM  Result Value Ref Range Status   Specimen Description   Final    URINE, RANDOM Performed at [facility], [place] [address] [place], [place] [address]    Special  Requests   Final    NONE Performed at [facility] [place], [place] [address], [place], [place] [address]    Culture   Final    NO GROWTH Performed at [facility], [place] [address], [place], [place] [address]    Report Status 05/31/2018 FINAL  Final  Blood culture (routine x 2)     Status: None (Preliminary result)   Collection Time: 06/05/18  2:52 AM  Result Value Ref Range Status   Specimen Description BLOOD LEFT HAND  Final   Special Requests   Final    BOTTLES DRAWN AEROBIC AND ANAEROBIC Blood Culture adequate volume   Culture  Setup Time   Final    IN BOTH AEROBIC AND ANAEROBIC BOTTLES GRAM NEGATIVE RODS Organism ID to follow CRITICAL RESULT CALLED TO, READ BACK BY AND VERIFIED WITH: L SEAY PHARMD 06/06/18 1225 JDW Performed at [facility], [place] [address], [place], [place] [address]    Culture GRAM NEGATIVE RODS  Final   Report Status PENDING  Incomplete  Blood Culture ID Panel (Reflexed)     Status: None   Collection Time: 06/05/18  2:52 AM  Result Value Ref Range Status   Enterococcus species NOT DETECTED NOT DETECTED Final    Comment: CRITICAL RESULT CALLED TO, READ BACK BY AND VERIFIED WITH: L SEAY PHARMD 06/06/18 1225 JDW    Listeria monocytogenes NOT DETECTED NOT DETECTED Final   Staphylococcus species NOT DETECTED NOT DETECTED Final   Staphylococcus aureus (BCID) NOT DETECTED NOT DETECTED Final   Streptococcus species NOT DETECTED NOT DETECTED Final   Streptococcus agalactiae NOT DETECTED NOT DETECTED Final   Streptococcus pneumoniae NOT DETECTED NOT DETECTED Final   Streptococcus pyogenes NOT DETECTED NOT DETECTED Final   Acinetobacter baumannii NOT DETECTED NOT DETECTED Final   Enterobacteriaceae species NOT DETECTED NOT DETECTED Final   Enterobacter cloacae complex NOT DETECTED NOT DETECTED Final   Escherichia coli NOT DETECTED NOT DETECTED Final   Klebsiella oxytoca NOT DETECTED NOT DETECTED Final   Klebsiella pneumoniae NOT DETECTED NOT  DETECTED Final   Proteus species NOT DETECTED NOT DETECTED Final   Serratia marcescens NOT DETECTED NOT DETECTED Final   Haemophilus influenzae NOT DETECTED NOT DETECTED Final   Neisseria meningitidis NOT DETECTED NOT DETECTED Final   Pseudomonas aeruginosa NOT DETECTED NOT DETECTED Final   Candida albicans NOT DETECTED NOT DETECTED Final   Candida glabrata NOT DETECTED NOT DETECTED Final   Candida krusei NOT DETECTED NOT DETECTED Final   Candida parapsilosis NOT DETECTED NOT DETECTED Final   Candida tropicalis NOT DETECTED NOT DETECTED Final    Comment: Performed at [facility], [place] [address] [place],  [address]  Blood culture (routine x 2)  Status: None (Preliminary result)   Collection Time: 06/05/18  3:00 AM  Result Value Ref Range Status   Specimen Description BLOOD RIGHT ANTECUBITAL  Final   Special Requests   Final    BOTTLES DRAWN AEROBIC AND ANAEROBIC Blood Culture adequate volume   Culture   Final    NO GROWTH < 12 HOURS Performed at [facility], [address] [place], [place] [address]    Report Status PENDING  Incomplete  MRSA PCR Screening     Status: None   Collection Time: 06/05/18  1:04 PM  Result Value Ref Range Status   MRSA by PCR NEGATIVE NEGATIVE Final    Comment:        The GeneXpert MRSA Assay (FDA approved for NASAL specimens only), is one component of a comprehensive MRSA colonization surveillance program. It is not intended to diagnose MRSA infection nor to guide or monitor treatment for MRSA infections. Performed at [facility], [place] [address] [place], [place] [address]     RADIOLOGY STUDIES/RESULTS: Ct Abdomen Pelvis Wo Contrast  Result Date: 06/05/2018 CLINICAL DATA:  Abdominal pain, fever.  Recent partial colectomy. EXAM: CT ABDOMEN AND PELVIS WITHOUT CONTRAST TECHNIQUE: Multidetector CT imaging of the abdomen and pelvis was performed following the standard protocol without IV contrast. COMPARISON:   05/30/2018 FINDINGS: Lower chest: Cardiomegaly.  Bibasilar atelectasis.  No effusions. Hepatobiliary: Small scattered hepatic cysts, small scattered hepatic low-density lesions throughout the liver, likely cysts although these cannot be characterized fully without intravenous contrast. Gallbladder unremarkable. Pancreas: No focal abnormality or ductal dilatation. Spleen: No focal abnormality.  Normal size. Adrenals/Urinary Tract: Numerous low-density, mixed density and high-density masses throughout the kidney are stable since recent study and cannot be characterized fully without intravenous contrast. At least 2 of these masses were shown on prior contrast enhanced CT to be suspicious for renal cell carcinoma. No hydronephrosis. Adrenal glands unremarkable. Urinary bladder decompressed and mildly thick walled, likely related to prostate enlargement. Stomach/Bowel: Descending colonic and sigmoid diverticulosis. Postoperative changes in the right colon from right hemicolectomy, stable. Again seen is the ventral hernia containing a portion of the mid transverse colon which appears unremarkable. No evidence of obstruction or bowel wall thickening. Stomach and small bowel grossly unremarkable. No evidence of bowel obstruction. Vascular/Lymphatic: Aortic atherosclerosis. No enlarged abdominal or pelvic lymph nodes. Reproductive: Marked enlargement of the prostate. Other: No free fluid or free air. Continued wall thickening within the periphery of the midline supraumbilical ventral hernia which also contains the transverse colon. Again, no underlying bowel wall thickening. This has a stable appearance since prior study. Musculoskeletal: No acute bony abnormality. Diffuse degenerative changes in the lumbar spine. IMPRESSION: Stable appearance of prior right hemicolectomy. Stable supraumbilical ventral hernia containing a portion of the transverse colon. The wall of the ventral hernia again appears thickened and indistinct,  but underlying bowel does not appear abnormal. Scattered diverticulosis.  No active diverticulitis. Marked prostate enlargement with bladder wall thickening. Multiple renal lesions. At least 2 of which were shown on prior contrast-enhanced CT to be suspicious for renal cell carcinoma. No change. Scattered hepatic low-density lesions cannot be characterized without intravenous contrast, likely cysts. Cardiomegaly, bibasilar atelectasis. Aortic atherosclerosis. Electronically Signed   By: Rolm Baptise M.D.   On: 06/05/2018 08:22   Ct Abdomen Pelvis Wo Contrast  Result Date: 05/30/2018 CLINICAL DATA:  Partial right colectomy, perform laparoscopically. EXAM: CT ABDOMEN AND PELVIS WITHOUT CONTRAST TECHNIQUE: Multidetector CT imaging of the abdomen and pelvis was performed  following the standard protocol without IV contrast. COMPARISON:  March 24, 2018 FINDINGS: Lower chest: Cardiomegaly.  No other abnormalities. Hepatobiliary: Multiple low-attenuation lesions in the liver are most likely cysts, unchanged. The liver and gallbladder are otherwise unremarkable. Pancreas: Unremarkable. No pancreatic ductal dilatation or surrounding inflammatory changes. Spleen: Normal in size without focal abnormality. Adrenals/Urinary Tract: Adrenal glands are normal. A 4.1 cm apparently solid mass is seen in the upper pole of the left kidney measuring 3.9 cm previously. A 2.7 cm mass in the medial left kidney on series 2, image 28 is essentially unchanged and nonspecific. Several renal cysts are noted. There is a solid and cystic appearing mass in the lower pole of the right kidney difficult to compare to the previous study due to lack of contrast today. However, the maximum measurement is approximately 4.6 cm, unchanged. Several probable hyperdense cysts are noted. The bladder is decompressed with a Foley catheter. There is mild fat stranding adjacent to the bladder which is a new finding. No renal stones or hydronephrosis. No ureteral  stones. Stomach/Bowel: The stomach and small bowel are normal. The patient is status post partial right colectomy scattered colonic diverticuli are noted. There is a new ventral hernia, located superior to the previously identified umbilical hernia. This is best seen on series 2, image 35. There is fat stranding within the hernia which likely contains omentum. It also contains a portion of transverse colon which does not appear to be inflamed. The wall of the hernia is thickened and inflamed. Surgical changes are seen in the right lower abdomen at the site of right colectomy. There is some increased attenuation in the surrounding fat, likely postoperative. No collection to suggest abscess or leak. The appendix is been removed along with the right partial colectomy. Vascular/Lymphatic: Mild atherosclerotic changes in the aorta. No obvious adenopathy. Reproductive: Again noted is an enlarged prostate exerting mass effect on the posterior bladder. Other: No abdominal wall hernia or abnormality. No abdominopelvic ascites. Musculoskeletal: No acute or significant osseous findings. IMPRESSION: 1. At least 2 renal masses concerning for renal cell carcinoma. The masses were better assessed with a previous contrast-enhanced study. Recommend an MRI when the patient is able. 2. There is a new ventral hernia. The periphery of the hernia/hernia wall is thickened and inflamed. The hernia contains transverse colon which does not appear to be inflamed or obstructed. There is also likely some herniated omentum with associated fat stranding. This hernia is new compared to March 24, 2018. No associated bowel obstruction. 3. Inflammatory changes in the right lower quadrant, surrounding the site of right partial colectomy. These findings are likely postsurgical as surgery was only just over 1 week ago. No evidence of leak or abscess. 4. Atherosclerotic changes in the abdominal aorta. Electronically Signed   By: Dorise Bullion III M.D    On: 05/30/2018 12:24   Dg Chest 2 View  Result Date: 06/05/2018 CLINICAL DATA:  Shortness of breath. EXAM: CHEST - 2 VIEW COMPARISON:  Chest CT 05/19/2018 FINDINGS: Chronic cardiomegaly and aortic tortuosity. No pulmonary edema, pleural effusion, focal airspace disease or pneumothorax. No acute osseous abnormalities. IMPRESSION: Cardiomegaly without congestive failure or acute chest finding. Electronically Signed   By: Keith Rake M.D.   On: 06/05/2018 03:21   Ct Angio Chest Aorta W &/or Wo Contrast  Result Date: 05/19/2018 CLINICAL DATA:  Yearly follow up thoracic aortic aneurysm. Hx of recent syncopal episodes in [place] and post prandial. EXAM: CT ANGIOGRAPHY CHEST WITH CONTRAST TECHNIQUE: Multidetector CT imaging  of the chest was performed using the standard protocol during bolus administration of intravenous contrast. Multiplanar CT image reconstructions and MIPs were obtained to evaluate the vascular anatomy. CONTRAST:  124mL ISOVUE-370 IOPAMIDOL (ISOVUE-370) INJECTION 76% COMPARISON:  05/30/2017 and previous FINDINGS: Cardiovascular: Cardiomegaly. No pericardial effusion. Dilated central pulmonary arteries. Fair contrast opacification of pulmonary artery branches; the exam was not optimized for detection of pulmonary emboli. Mild scattered coronary calcifications. Good contrast opacification of the thoracic aorta without dissection or stenosis. Transverse diameters as follows: 5.2 cm sinuses of Valsalva 4.2 cm  sino-tubular junction 4.6 cm mid ascending (previously 4.3 cm) 4.3 cm distal ascending/proximal arch 3.4 cm distal arch 3.2 cm proximal descending 2.8 cm distal descending Bovine variant brachiocephalic arterial origin anatomy without proximal stenosis. Visualized proximal abdominal aorta unremarkable. Ectatic distal celiac axis up to 1.5 cm diameter. Mediastinum/Nodes: No hilar or mediastinal adenopathy. Lungs/Pleura: No pleural effusion.  No pneumothorax.  Lungs clear. Upper Abdomen:  Nodular liver contour. Stable hepatic cysts. Exophytic 5.8 cm upper pole left renal lesion measuring above fluid attenuation. No acute findings. Musculoskeletal: 5 mm subcutaneous metallic density in the left anterior chest wall subcutaneous tissues. Mid and lower thoracic spondylitic change. No fracture or worrisome bone lesion. Review of the MIP images confirms the above findings. IMPRESSION: 1. 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 Recommend semi-annual imaging followup by CTA or MRA and referral to cardiothoracic surgery if not already obtained. This recommendation follows 2010 ACCF/AHA/AATS/ACR/ASA/SCA/SCAI/SIR/STS/SVM Guidelines for the Diagnosis and Management of Patients With Thoracic Aortic Disease. Circulation. 2010; 121: G626-R485 2. 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 Recommend dedicated renal protocol CT or MR with contrast. 3. Coronary calcifications. The severity of coronary artery disease and any potential stenosis cannot be assessed on this non-gated CT examination. Assessment for potential risk factor modification, dietary therapy or pharmacologic therapy may be warranted, if clinically indicated. 4. Nodular liver contour suggesting cirrhosis. Electronically Signed   By: Lucrezia Europe M.D.   On: 05/19/2018 13:45     LOS: 1 day   Oren Binet, MD  [facility]  If 7PM-7AM, please contact night-coverage  Please page via [email or web address]-Password [email or web address]-click on MD name and type text message  06/06/2018, 10:09 AM

## 2018-06-06 NOTE — Progress Notes (Signed)
Patient's BP=146/106, repeat 134/102 HR fluctuating b/n 78 to 130s. One time sustained in the  150s for 3 minutes. On call provider paged via amion and awaiting a call back. No acute distress noted. Will continue to monitor.

## 2018-06-06 NOTE — Progress Notes (Signed)
PHARMACY - PHYSICIAN COMMUNICATION CRITICAL VALUE ALERT - BLOOD CULTURE IDENTIFICATION (BCID)  [name] [name] is an 72 y.o. male who presented to [facility] on 06/05/2018 with a chief complaint of sepsis  Assessment: 2/2 BC gnr, no BCID  Name of physician (or Provider) Contacted: X Blount Current antibiotics: vanc and cefepime  Changes to prescribed antibiotics recommended:  none  No results found for this or any previous visit.  Excell Seltzer Poteet 06/06/2018  12:28 AM

## 2018-06-07 LAB — CBC
HCT: 21.8 % — ABNORMAL LOW (ref 39.0–52.0)
HEMOGLOBIN: 7.1 g/dL — AB (ref 13.0–17.0)
MCH: 32.1 pg (ref 26.0–34.0)
MCHC: 32.6 g/dL (ref 30.0–36.0)
MCV: 98.6 fL (ref 78.0–100.0)
PLATELETS: 284 10*3/uL (ref 150–400)
RBC: 2.21 MIL/uL — ABNORMAL LOW (ref 4.22–5.81)
RDW: 12.7 % (ref 11.5–15.5)
WBC: 6.9 10*3/uL (ref 4.0–10.5)

## 2018-06-07 LAB — URINALYSIS, ROUTINE W REFLEX MICROSCOPIC
BILIRUBIN URINE: NEGATIVE
Glucose, UA: NEGATIVE mg/dL
Ketones, ur: NEGATIVE mg/dL
Nitrite: NEGATIVE
PROTEIN: 30 mg/dL — AB
RBC / HPF: >50 RBC/hpf — ABNORMAL HIGH (ref 0–5)
SPECIFIC GRAVITY, URINE: 1.008 (ref 1.005–1.030)
pH: 6 (ref 5.0–8.0)

## 2018-06-07 LAB — CREATININE, URINE, RANDOM: Creatinine, Urine: 61.52 mg/dL

## 2018-06-07 LAB — BASIC METABOLIC PANEL
Anion gap: 10 (ref 5–15)
BUN: 64 mg/dL — ABNORMAL HIGH (ref 8–23)
CO2: 22 mmol/L (ref 22–32)
Calcium: 8.5 mg/dL — ABNORMAL LOW (ref 8.9–10.3)
Chloride: 107 mmol/L (ref 98–111)
Creatinine, Ser: 5.99 mg/dL — ABNORMAL HIGH (ref 0.61–1.24)
GFR calc Af Amer: 10 mL/min — ABNORMAL LOW (ref >60)
GFR, EST NON AFRICAN AMERICAN: 8 mL/min — AB (ref >60)
GLUCOSE: 95 mg/dL (ref 70–99)
Potassium: 3.8 mmol/L (ref 3.5–5.1)
Sodium: 139 mmol/L (ref 135–145)

## 2018-06-07 LAB — SODIUM, URINE, RANDOM: Sodium, Ur: 84 mmol/L

## 2018-06-07 MED ORDER — SODIUM CHLORIDE 0.9 % IV SOLN
1.0000 g | INTRAVENOUS | Status: DC
  Administered 2018-06-08: 1 g via INTRAVENOUS
  Filled 2018-06-07: qty 1

## 2018-06-07 NOTE — Progress Notes (Addendum)
PROGRESS NOTE        PATIENT DETAILS Name: [name] [name] Age: 72 y.o. Sex: male Date of Birth: 1946/07/24 Admit Date: 06/05/2018 Admitting Physician Albertine Patricia, MD GGE:[id number], Christean Grief, MD  Brief Narrative: Patient is a 72 y.o. male with history of CAD, hypertension, PAF not on anticoagulation-recent right hemicolectomy for a cecal mass-just discharged by the general surgical service on 6/54-[id number] course was complicated by development of obstructive uropathy and AKI ( discharged with a Foley catheter, successful outpatient voiding trial on 10/1)-presented to the hospital with sepsis secondary to UTI/Acinetobacter bacteremia, acute kidney injury.  See below for further details.  Subjective: No chest pain or shortness of breath.  No abdominal pain.  No nausea vomiting.  Assessment/Plan: Sepsis secondary to Acinetobacter bacteremia and UTI: Probably related to recent Foley catheter placement-sepsis pathophysiology has resolved.  Blood and urine cultures growing Acinetobacter.  Await final culture results and will adjust antibiotics accordingly.  AKI on CKD stage III: AKI hemodynamically mediated-due to concern for obstructive uropathy (enlarged prostate)-Foley catheter placed on 10/5.  Creatinine worsening-however has good urine output-potassium and bicarb appears stable.  Appears to have slight amount of edema in his lower extremities-will decrease IV fluid to 75 cc an hour.  Have consulted nephrology today-Dr. Melvia Heaps will evaluate later today Hypokalemia: Replete and recheck  BPH with recent urinary retention/obstructive uropathy requiring Foley catheter placement: Continue Flomax and Proscar-Foley catheter in place-inserted on 10/5 due to worsening renal function and concern for obstructive uropathy contributing.  Voiding trial once renal function is better.  Anemia: Multifactorial-probably anemia of chronic disease at baseline-worsened by acute illness/IV  fluid dilution.  Appears asymptomatic-recheck CBC tomorrow-if further drop in hemoglobin will require PRBC transfusion.  Chronic diastolic heart failure: Has mild leg edema but otherwise euvolemic-decrease IV fluids-follow.  Hypertension: Blood pressure stable with soft-continue to hold all antihypertensives.    CAD: No anginal symptoms-follow.  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  DVT Prophylaxis: Prophylactic Heparin   Code Status: Full code  Family Communication: None at bedside  Disposition Plan: Remain inpatient  Antimicrobial agents: Anti-infectives (From admission, onward)   Start     Dose/Rate Route Frequency Ordered Stop   06/06/18 1345  meropenem (MERREM) 1 g in sodium chloride 0.9 % 100 mL IVPB     1 g 200 mL/hr over 30 Minutes Intravenous Every 12 hours 06/06/18 1333     06/06/18 0800  ceFEPIme (MAXIPIME) 1 g in sodium chloride 0.9 % 100 mL IVPB  Status:  Discontinued     1 g 200 mL/hr over 30 Minutes Intravenous Every 24 hours 06/05/18 1056 06/06/18 1333   06/05/18 0900  vancomycin (VANCOCIN) 2,000 mg in sodium chloride 0.9 % 500 mL IVPB     2,000 mg 250 mL/hr over 120 Minutes Intravenous  Once 06/05/18 0816 06/05/18 1149   06/05/18 0730  ceFEPIme (MAXIPIME) 2 g in sodium chloride 0.9 % 100 mL IVPB     2 g 200 mL/hr over 30 Minutes Intravenous  Once 06/05/18 0720 06/05/18 0833   06/05/18 0730  metroNIDAZOLE (FLAGYL) IVPB 500 mg  Status:  Discontinued     500 mg 100 mL/hr over 60 Minutes Intravenous Every 8 hours 06/05/18 0720 06/05/18 1035   06/05/18 0730  vancomycin (VANCOCIN) IVPB 1000 mg/200 mL premix  Status:  Discontinued  1,000 mg 200 mL/hr over 60 Minutes Intravenous  Once 06/05/18 0720 06/05/18 0816      Procedures: None  CONSULTS:  None  Time spent: 35 minutes-Greater than 50% of this time was spent in counseling, explanation of diagnosis,  planning of further management, and coordination of care.  MEDICATIONS: Scheduled Meds: . finasteride  5 mg Oral Daily  . heparin  5,000 Units Subcutaneous Q8H  . tamsulosin  0.4 mg Oral QHS   Continuous Infusions: . sodium chloride 125 mL/hr at 06/07/18 0606  . meropenem (MERREM) IV 1 g (06/07/18 1027)   PRN Meds:.acetaminophen **OR** acetaminophen, albuterol, HYDROcodone-acetaminophen, metoprolol tartrate, ondansetron **OR** ondansetron (ZOFRAN) IV, senna-docusate   PHYSICAL EXAM: Vital signs: Vitals:   06/06/18 2126 06/07/18 0049 06/07/18 0423 06/07/18 0600  BP: 133/85 (!) 135/91 134/86 (!) 132/92  Pulse: 66 66  62  Resp: (!) 21 17  19   Temp:  97.7 F (36.5 C) 98.3 F (36.8 C)   TempSrc:  Oral Oral   SpO2: 96% 99%  99%  Weight:      Height:       Filed Weights   06/05/18 0256 06/05/18 1251  Weight: 99.8 kg 99.8 kg   Body mass index is 35.51 kg/m.   General appearance:Awake, alert, not in any distress.  Eyes:no scleral icterus. HEENT: Atraumatic and Normocephalic Neck: supple, no JVD. Resp:Good air entry bilaterally, few scattered rhonchi CVS: S1 S2 regular, no murmurs.  GI: Bowel sounds present, Non tender and not distended with no gaurding, rigidity or rebound. Extremities: B/L Lower Ext shows trace edema, both legs are warm to touch Neurology:  Non focal Psychiatric: Normal judgment and insight. Normal mood. Musculoskeletal:No digital cyanosis Skin:No Rash, warm and dry Wounds:N/A  I have personally reviewed following labs and imaging studies  LABORATORY DATA: CBC: Recent Labs  Lab 06/05/18 0258 06/06/18 0330 06/07/18 0412  WBC 13.4* 8.6 6.9  NEUTROABS 12.5*  --   --   HGB 8.3* 8.0* 7.1*  HCT 25.8* 24.9* 21.8*  MCV 98.1 98.4 98.6  PLT 302 288 193    Basic Metabolic Panel: Recent Labs  Lab 06/05/18 0258 06/06/18 0330 06/07/18 0412  NA 135 137 139  K 3.2* 3.4* 3.8  CL 99 103 107  CO2 22 21* 22  GLUCOSE 146* 103* 95  BUN 51* 56* 64*    CREATININE 3.72* 4.84* 5.99*  CALCIUM 8.8* 8.6* 8.5*    GFR: Estimated Creatinine Clearance: 12 mL/min (A) (by C-G formula based on SCr of 5.99 mg/dL (H)).  Liver Function Tests: Recent Labs  Lab 06/05/18 0258  AST 26  ALT 18  ALKPHOS 78  BILITOT 1.7*  PROT 6.8  ALBUMIN 3.2*   No results for input(s): LIPASE, AMYLASE in the last 168 hours. No results for input(s): AMMONIA in the last 168 hours.  Coagulation Profile: No results for input(s): INR, PROTIME in the last 168 hours.  Cardiac Enzymes: No results for input(s): CKTOTAL, CKMB, CKMBINDEX, TROPONINI in the last 168 hours.  BNP (last 3 results) No results for input(s): PROBNP in the last 8760 hours.  HbA1C: No results for input(s): HGBA1C in the last 72 hours.  CBG: No results for input(s): GLUCAP in the last 168 hours.  Lipid Profile: No results for input(s): CHOL, HDL, LDLCALC, TRIG, CHOLHDL, LDLDIRECT in the last 72 hours.  Thyroid Function Tests: No results for input(s): TSH, T4TOTAL, FREET4, T3FREE, THYROIDAB in the last 72 hours.  Anemia Panel: No results for input(s): VITAMINB12, FOLATE, FERRITIN, TIBC, IRON,  RETICCTPCT in the last 72 hours.  Urine analysis:    Component Value Date/Time   COLORURINE YELLOW 06/05/2018 0723   APPEARANCEUR TURBID (A) 06/05/2018 0723   LABSPEC 1.020 06/05/2018 0723   PHURINE 6.0 06/05/2018 0723   GLUCOSEU NEGATIVE 06/05/2018 0723   HGBUR MODERATE (A) 06/05/2018 0723   BILIRUBINUR NEGATIVE 06/05/2018 0723   KETONESUR NEGATIVE 06/05/2018 0723   PROTEINUR 100 (A) 06/05/2018 0723   NITRITE NEGATIVE 06/05/2018 0723   LEUKOCYTESUR SMALL (A) 06/05/2018 0723    Sepsis Labs: Lactic Acid, Venous    Component Value Date/Time   LATICACIDVEN 1.63 06/05/2018 0310    MICROBIOLOGY: Recent Results (from the past 240 hour(s))  Urine culture     Status: None   Collection Time: 05/30/18 10:56 AM  Result Value Ref Range Status   Specimen Description   Final    URINE,  RANDOM Performed at [facility], [place] [address], [place], [place] [address]    Special Requests   Final    NONE Performed at [facility], [place] [address], [place], [place] [address]    Culture   Final    NO GROWTH Performed at [facility], [place] [address], [place], [place] [address]    Report Status 05/31/2018 FINAL  Final  Blood culture (routine x 2)     Status: Abnormal   Collection Time: 06/05/18  2:52 AM  Result Value Ref Range Status   Specimen Description BLOOD LEFT HAND  Final   Special Requests   Final    BOTTLES DRAWN AEROBIC AND ANAEROBIC Blood Culture adequate volume   Culture  Setup Time   Final    IN BOTH AEROBIC AND ANAEROBIC BOTTLES GRAM NEGATIVE RODS CRITICAL RESULT CALLED TO, READ BACK BY AND VERIFIED WITH: L SEAY PHARMD 06/06/18 1225 JDW Performed at [facility], [place] [address] [place], [place] [address]    Culture ACINETOBACTER CALCOACETICUS/BAUMANNII COMPLEX (A)  Final   Report Status 06/07/2018 FINAL  Final   Organism ID, Bacteria ACINETOBACTER CALCOACETICUS/BAUMANNII COMPLEX  Final      Susceptibility   Acinetobacter calcoaceticus/baumannii complex - MIC*    CEFTAZIDIME 8 SENSITIVE Sensitive     CEFTRIAXONE 16 INTERMEDIATE Intermediate     CIPROFLOXACIN 0.5 SENSITIVE Sensitive     GENTAMICIN 4 SENSITIVE Sensitive     IMIPENEM <=0.25 SENSITIVE Sensitive     PIP/TAZO <=4 SENSITIVE Sensitive     TRIMETH/SULFA <=20 SENSITIVE Sensitive     CEFEPIME 8 SENSITIVE Sensitive     AMPICILLIN/SULBACTAM <=2 SENSITIVE Sensitive     * ACINETOBACTER CALCOACETICUS/BAUMANNII COMPLEX  Blood Culture ID Panel (Reflexed)     Status: None   Collection Time: 06/05/18  2:52 AM  Result Value Ref Range Status   Enterococcus species NOT DETECTED NOT DETECTED Final    Comment: CRITICAL RESULT CALLED TO, READ BACK BY AND VERIFIED WITH: L SEAY PHARMD 06/06/18 1225 JDW    Listeria monocytogenes NOT DETECTED NOT DETECTED Final    Staphylococcus species NOT DETECTED NOT DETECTED Final   Staphylococcus aureus (BCID) NOT DETECTED NOT DETECTED Final   Streptococcus species NOT DETECTED NOT DETECTED Final   Streptococcus agalactiae NOT DETECTED NOT DETECTED Final   Streptococcus pneumoniae NOT DETECTED NOT DETECTED Final   Streptococcus pyogenes NOT DETECTED NOT DETECTED Final   Acinetobacter baumannii NOT DETECTED NOT DETECTED Final   Enterobacteriaceae species NOT DETECTED NOT DETECTED Final   Enterobacter cloacae complex NOT DETECTED NOT DETECTED Final   Escherichia coli  NOT DETECTED NOT DETECTED Final   Klebsiella oxytoca NOT DETECTED NOT DETECTED Final   Klebsiella pneumoniae NOT DETECTED NOT DETECTED Final   Proteus species NOT DETECTED NOT DETECTED Final   Serratia marcescens NOT DETECTED NOT DETECTED Final   Haemophilus influenzae NOT DETECTED NOT DETECTED Final   Neisseria meningitidis NOT DETECTED NOT DETECTED Final   Pseudomonas aeruginosa NOT DETECTED NOT DETECTED Final   Candida albicans NOT DETECTED NOT DETECTED Final   Candida glabrata NOT DETECTED NOT DETECTED Final   Candida krusei NOT DETECTED NOT DETECTED Final   Candida parapsilosis NOT DETECTED NOT DETECTED Final   Candida tropicalis NOT DETECTED NOT DETECTED Final    Comment: Performed at [facility], [place] [address] [place], [place] [address]  Blood culture (routine x 2)     Status: None (Preliminary result)   Collection Time: 06/05/18  3:00 AM  Result Value Ref Range Status   Specimen Description BLOOD RIGHT ANTECUBITAL  Final   Special Requests   Final    BOTTLES DRAWN AEROBIC AND ANAEROBIC Blood Culture adequate volume   Culture   Final    NO GROWTH 1 DAY Performed at [facility], [place] [address], [place], [place] [address]    Report Status PENDING  Incomplete  Culture, Urine     Status: Abnormal (Preliminary result)   Collection Time: 06/05/18  8:00 AM  Result Value Ref Range Status   Specimen Description URINE, RANDOM   Final   Special Requests NONE  Final   Culture (A)  Final    >=100,000 COLONIES/mL ENTEROCOCCUS FAECALIS 10,000 COLONIES/mL ACINETOBACTER CALCOACETICUS/BAUMANNII COMPLEX SUSCEPTIBILITIES TO FOLLOW Performed at [facility], [place] [address], [place], [place] [address]    Report Status PENDING  Incomplete  MRSA PCR Screening     Status: None   Collection Time: 06/05/18  1:04 PM  Result Value Ref Range Status   MRSA by PCR NEGATIVE NEGATIVE Final    Comment:        The GeneXpert MRSA Assay (FDA approved for NASAL specimens only), is one component of a comprehensive MRSA colonization surveillance program. It is not intended to diagnose MRSA infection nor to guide or monitor treatment for MRSA infections. Performed at [facility], [place] [address], [place], [place] [address]     RADIOLOGY STUDIES/RESULTS: Ct Abdomen Pelvis Wo Contrast  Result Date: 06/05/2018 CLINICAL DATA:  Abdominal pain, fever.  Recent partial colectomy. EXAM: CT ABDOMEN AND PELVIS WITHOUT CONTRAST TECHNIQUE: Multidetector CT imaging of the abdomen and pelvis was performed following the standard protocol without IV contrast. COMPARISON:  05/30/2018 FINDINGS: Lower chest: Cardiomegaly.  Bibasilar atelectasis.  No effusions. Hepatobiliary: Small scattered hepatic cysts, small scattered hepatic low-density lesions throughout the liver, likely cysts although these cannot be characterized fully without intravenous contrast. Gallbladder unremarkable. Pancreas: No focal abnormality or ductal dilatation. Spleen: No focal abnormality.  Normal size. Adrenals/Urinary Tract: Numerous low-density, mixed density and high-density masses throughout the kidney are stable since recent study and cannot be characterized fully without intravenous contrast. At least 2 of these masses were shown on prior contrast enhanced CT to be suspicious for renal cell carcinoma. No hydronephrosis. Adrenal glands unremarkable. Urinary bladder  decompressed and mildly thick walled, likely related to prostate enlargement. Stomach/Bowel: Descending colonic and sigmoid diverticulosis. Postoperative changes in the right colon from right hemicolectomy, stable. Again seen is the ventral hernia containing a portion of the mid transverse colon which appears unremarkable. No evidence of obstruction or  bowel wall thickening. Stomach and small bowel grossly unremarkable. No evidence of bowel obstruction. Vascular/Lymphatic: Aortic atherosclerosis. No enlarged abdominal or pelvic lymph nodes. Reproductive: Marked enlargement of the prostate. Other: No free fluid or free air. Continued wall thickening within the periphery of the midline supraumbilical ventral hernia which also contains the transverse colon. Again, no underlying bowel wall thickening. This has a stable appearance since prior study. Musculoskeletal: No acute bony abnormality. Diffuse degenerative changes in the lumbar spine. IMPRESSION: Stable appearance of prior right hemicolectomy. Stable supraumbilical ventral hernia containing a portion of the transverse colon. The wall of the ventral hernia again appears thickened and indistinct, but underlying bowel does not appear abnormal. Scattered diverticulosis.  No active diverticulitis. Marked prostate enlargement with bladder wall thickening. Multiple renal lesions. At least 2 of which were shown on prior contrast-enhanced CT to be suspicious for renal cell carcinoma. No change. Scattered hepatic low-density lesions cannot be characterized without intravenous contrast, likely cysts. Cardiomegaly, bibasilar atelectasis. Aortic atherosclerosis. Electronically Signed   By: Rolm Baptise M.D.   On: 06/05/2018 08:22   Ct Abdomen Pelvis Wo Contrast  Result Date: 05/30/2018 CLINICAL DATA:  Partial right colectomy, perform laparoscopically. EXAM: CT ABDOMEN AND PELVIS WITHOUT CONTRAST TECHNIQUE: Multidetector CT imaging of the abdomen and pelvis was performed  following the standard protocol without IV contrast. COMPARISON:  March 24, 2018 FINDINGS: Lower chest: Cardiomegaly.  No other abnormalities. Hepatobiliary: Multiple low-attenuation lesions in the liver are most likely cysts, unchanged. The liver and gallbladder are otherwise unremarkable. Pancreas: Unremarkable. No pancreatic ductal dilatation or surrounding inflammatory changes. Spleen: Normal in size without focal abnormality. Adrenals/Urinary Tract: Adrenal glands are normal. A 4.1 cm apparently solid mass is seen in the upper pole of the left kidney measuring 3.9 cm previously. A 2.7 cm mass in the medial left kidney on series 2, image 28 is essentially unchanged and nonspecific. Several renal cysts are noted. There is a solid and cystic appearing mass in the lower pole of the right kidney difficult to compare to the previous study due to lack of contrast today. However, the maximum measurement is approximately 4.6 cm, unchanged. Several probable hyperdense cysts are noted. The bladder is decompressed with a Foley catheter. There is mild fat stranding adjacent to the bladder which is a new finding. No renal stones or hydronephrosis. No ureteral stones. Stomach/Bowel: The stomach and small bowel are normal. The patient is status post partial right colectomy scattered colonic diverticuli are noted. There is a new ventral hernia, located superior to the previously identified umbilical hernia. This is best seen on series 2, image 35. There is fat stranding within the hernia which likely contains omentum. It also contains a portion of transverse colon which does not appear to be inflamed. The wall of the hernia is thickened and inflamed. Surgical changes are seen in the right lower abdomen at the site of right colectomy. There is some increased attenuation in the surrounding fat, likely postoperative. No collection to suggest abscess or leak. The appendix is been removed along with the right partial colectomy.  Vascular/Lymphatic: Mild atherosclerotic changes in the aorta. No obvious adenopathy. Reproductive: Again noted is an enlarged prostate exerting mass effect on the posterior bladder. Other: No abdominal wall hernia or abnormality. No abdominopelvic ascites. Musculoskeletal: No acute or significant osseous findings. IMPRESSION: 1. At least 2 renal masses concerning for renal cell carcinoma. The masses were better assessed with a previous contrast-enhanced study. Recommend an MRI when the patient is able. 2. There is  a new ventral hernia. The periphery of the hernia/hernia wall is thickened and inflamed. The hernia contains transverse colon which does not appear to be inflamed or obstructed. There is also likely some herniated omentum with associated fat stranding. This hernia is new compared to March 24, 2018. No associated bowel obstruction. 3. Inflammatory changes in the right lower quadrant, surrounding the site of right partial colectomy. These findings are likely postsurgical as surgery was only just over 1 week ago. No evidence of leak or abscess. 4. Atherosclerotic changes in the abdominal aorta. Electronically Signed   By: Dorise Bullion III M.D   On: 05/30/2018 12:24   Dg Chest 2 View  Result Date: 06/05/2018 CLINICAL DATA:  Shortness of breath. EXAM: CHEST - 2 VIEW COMPARISON:  Chest CT 05/19/2018 FINDINGS: Chronic cardiomegaly and aortic tortuosity. No pulmonary edema, pleural effusion, focal airspace disease or pneumothorax. No acute osseous abnormalities. IMPRESSION: Cardiomegaly without congestive failure or acute chest finding. Electronically Signed   By: Keith Rake M.D.   On: 06/05/2018 03:21   US Renal  Result Date: 06/06/2018 CLINICAL DATA:  Acute kidney injury, history coronary artery disease, chronic diastolic CHF, COPD, type II diabetes mellitus, hypertension, hypertensive heart disease, prostate cancer, renal cell carcinoma EXAM: RENAL / URINARY TRACT ULTRASOUND COMPLETE  COMPARISON:  CT abdomen and pelvis 06/05/2017 FINDINGS: Right Kidney: Length: 14.5 cm. Normal cortical thickness. Upper normal echogenicity. Cystic lesion at mid kidney, 2.7 x 2.2 x 3.0 cm, containing a few scattered low level internal echoes; this lesion is hyperdense on the prior CT exam. An additional nodule at the inferior pole measures 3.9 x 2.0 x 2.7 cm and demonstrates multiple septations and low-level scattered internal echogenicity. This lesion is mildly complicated and demonstrated low-attenuation and high attenuation components on the prior CT. No hydronephrosis or shadowing calcification. Left Kidney: Length: 16.7 cm. Normal cortical thickness. Minimally increased cortical echogenicity. Small simple cyst at inferior pole, 3.9 x 2.0 x 2.7 cm. Additional complicated cyst at upper pole 2.5 x 2.1 x 2.9 cm with a scattered internal echoes. Additional mass at upper pole appear solid, 4.9 x 4.5 x 4.5 cm, highly suspicious for a renal neoplasm. No hydronephrosis or shadowing calcification. Bladder: Normal appearing bladder. Marked prostatic enlargement, 9.7 x 4.5 x 6.8 cm IMPRESSION: Probable medical renal disease changes of the kidneys. BILATERAL simple renal cysts. Additional solid mass at upper pole LEFT kidney 4.9 cm greatest size most likely representing a solid renal neoplasm, demonstrated enhancement on a prior CT exam of 03/24/2018 and now demonstrates an interval increase in size. Additional complicated cystic lesions at the inferior pole of RIGHT kidney and upper pole LEFT kidney, indeterminate, cannot exclude renal neoplasms; consider MR characterization. Marked prostatic enlargement. Electronically Signed   By: Lavonia Dana M.D.   On: 06/06/2018 17:55   Ct Angio Chest Aorta W &/or Wo Contrast  Result Date: 05/19/2018 CLINICAL DATA:  Yearly follow up thoracic aortic aneurysm. Hx of recent syncopal episodes in [place] and post prandial. EXAM: CT ANGIOGRAPHY CHEST WITH CONTRAST TECHNIQUE:  Multidetector CT imaging of the chest was performed using the standard protocol during bolus administration of intravenous contrast. Multiplanar CT image reconstructions and MIPs were obtained to evaluate the vascular anatomy. CONTRAST:  1108mL ISOVUE-370 IOPAMIDOL (ISOVUE-370) INJECTION 76% COMPARISON:  05/30/2017 and previous FINDINGS: Cardiovascular: Cardiomegaly. No pericardial effusion. Dilated central pulmonary arteries. Fair contrast opacification of pulmonary artery branches; the exam was not optimized for detection of pulmonary emboli. Mild scattered coronary calcifications. Good contrast opacification of  the thoracic aorta without dissection or stenosis. Transverse diameters as follows: 5.2 cm sinuses of Valsalva 4.2 cm  sino-tubular junction 4.6 cm mid ascending (previously 4.3 cm) 4.3 cm distal ascending/proximal arch 3.4 cm distal arch 3.2 cm proximal descending 2.8 cm distal descending Bovine variant brachiocephalic arterial origin anatomy without proximal stenosis. Visualized proximal abdominal aorta unremarkable. Ectatic distal celiac axis up to 1.5 cm diameter. Mediastinum/Nodes: No hilar or mediastinal adenopathy. Lungs/Pleura: No pleural effusion.  No pneumothorax.  Lungs clear. Upper Abdomen: Nodular liver contour. Stable hepatic cysts. Exophytic 5.8 cm upper pole left renal lesion measuring above fluid attenuation. No acute findings. Musculoskeletal: 5 mm subcutaneous metallic density in the left anterior chest wall subcutaneous tissues. Mid and lower thoracic spondylitic change. No fracture or worrisome bone lesion. Review of the MIP images confirms the above findings. IMPRESSION: 1. 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 Recommend semi-annual imaging followup by CTA or MRA and referral to cardiothoracic surgery if not already obtained. This recommendation follows 2010 ACCF/AHA/AATS/ACR/ASA/SCA/SCAI/SIR/STS/SVM  Guidelines for the Diagnosis and Management of Patients With Thoracic Aortic Disease. Circulation. 2010; 121: C944-H675 2. 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 Recommend dedicated renal protocol CT or MR with contrast. 3. Coronary calcifications. The severity of coronary artery disease and any potential stenosis cannot be assessed on this non-gated CT examination. Assessment for potential risk factor modification, dietary therapy or pharmacologic therapy may be warranted, if clinically indicated. 4. Nodular liver contour suggesting cirrhosis. Electronically Signed   By: Lucrezia Europe M.D.   On: 05/19/2018 13:45     LOS: 2 days   Oren Binet, MD  [facility]  If 7PM-7AM, please contact night-coverage  Please page via [email or web address]-click on MD name and type text message  06/07/2018, 12:50 PM

## 2018-06-07 NOTE — Progress Notes (Signed)
Telemetry tech called to alert RN of apparent sick sinus syndrome. Pt tachycardic then brady. Pt now stable in 70-80's. Pt in NAD. MD paged with information via amion. Awaiting response.

## 2018-06-07 NOTE — Consult Note (Signed)
Renal Service Consult Note [name]  [name] [name] 06/07/2018 [name] [name] Requesting Physician:  DR Sloan Leiter  Reason for Consult:  Acute on CRF HPI: The patient is a 72 y.o. year-old with hx COPD, HTN, CAD, CKD III, PAF, BPH admitted 10/3 w/ fevers and UTI, admitted 06/05/18 w/ fevers, diarrhea, difficulty urinating, chills and sweats.  BP's were low normal, +febrile, ^WBC in ED and pt was admitted and given fluid boluses and IV abx and admitted. Urine and blood cx's were + for acinetobacter.  BP's have been stable, some low bp's the first 36 hrs but not below 95 SBP.  Now bp's are good.  Unfortuniately creat on admit of 3.72 has continued to rise up to 4.8 yest and 5.9 today.  Foley was placed for concern of urinary retention yesterday on 10/5.  Renal US done today showed no hydro, +renal masses and cysts, markedly enlarged prostate,  some Edison Pace.  Asked to see for AKI.   Patient known to me has wife was on HD, died about 4 yrs ago.  Reports long time difficulty passing urine.  He was here 9/19- 05/26/18 for a R hemicolectomy for cecal polyp. Creatinine bumped postop so a foley was placed, he required a Coude due to prostatic enlargement.  Creat peaked at 2.8 then improved after cath placed down to 2.51 at dc.    Patient feels much better than on admission, fevers resolved, good appetite, no abd pain or dysuria.  Foley cath in place, lots of urine in bag.     ROS  denies CP  no joint pain   no HA  no blurry vision  no rash  no diarrhea  no nausea/ vomiting  no dysuria  no difficulty voiding  no change in urine color    Past Medical History  Past Medical History:  Diagnosis Date  . Abnormal LFTs (liver function tests)   . Anxiety   . Aortic root dilatation ([place])    a. 12/2015 Echo: mod dil of sinus of valsalva and Ao root (38m).  b. 53 mm Echo 2018  . BPH with urinary obstruction   . Bradycardia   . C5-C7 level with spinal cord injury with central cord  syndrome, without evidence of spinal bone injury ([place] 10/12/2016  . CAD (coronary artery disease)    a. 12/2015 NSTEMI/Cath (in setting of PAF):  LM nl, LAD 30p,  LCX 363mRCA  ok, AM 100%, RPDA1 40, RPDA 2 60 ->Med Rx.  . Cervical pain 12/09/2014  . Childhood asthma   . Chronic diastolic CHF (congestive heart failure) ([place]   a. 12/2015 Echo: EF 50-55%, mod AI, mod Ao root dil, mild MR, mod dil LA, mod RA.  . Marland KitchenOPD (chronic obstructive pulmonary disease) ([place]   pt denies at preop  . Diabetes mellitus type II    diet controlled  . History of syncope   . Hyperlipidemia   . Hypertensive heart disease   . Kidney lump 04/04/2010   Overview:  Renal Cell Carcinoma   . Moderate aortic insufficiency    a. 12/2015 Echo: Mod AI.  . Marland Kitchenveractive bladder   . Paroxysmal atrial fibrillation ([facility]   a. 12/2015 started on Xarelto (CHA2DS2VASc = 4-5).  . Pneumonia   . Prostate cancer ([place]  . Renal cell carcinoma ([place]  . Renal lesion   . Sleep apnea    Does not like  CPAP  . Spinal stenosis in cervical region 10/12/2016  Past Surgical History  Past Surgical History:  Procedure Laterality Date  . BACK SURGERY    . CARDIAC CATHETERIZATION N/A 12/18/2015   Procedure: Left Heart Cath and Coronary Angiography;  Surgeon: Leonie Man, MD;  Location: [place] [facility];  Service: Cardiovascular;  Laterality: N/A;  . DIAGNOSTIC LAPAROSCOPY     partial colectomy  . LAPAROSCOPIC PARTIAL COLECTOMY Right 05/21/2018   Procedure: LAPAROSCOPIC RIGHT  COLECTOMY ERAS PATHWAY;  Surgeon: Leighton Ruff, MD;  Location: [facility];  Service: General;  Laterality: Right;  . [place]   "replaced a disc"   Family History  Family History  Problem Relation Age of Onset  . Emphysema Father   . Asthma Father   . Liver disease Father        tumor  . Heart disease Mother   . Hypertension Mother   . Asthma Sister   . Hypertension Son    Social History  reports that he quit smoking about 40 years ago.  His smoking use included cigars. He has never used smokeless tobacco. He reports that he drank alcohol. He reports that he does not use drugs. Allergies  Allergies  Allergen Reactions  . Amoxicillin Other (See Comments)    Can't move Has patient had a PCN reaction causing immediate rash, facial/tongue/throat swelling, SOB or lightheadedness with hypotension: No Has patient had a PCN reaction causing severe rash involving mucus membranes or skin necrosis: No Has patient had a PCN reaction that required hospitalization No Has patient had a PCN reaction occurring within the last 10 years: No If all of the above answers are "NO", then may proceed with Cephalosporin use.   [name] Penicillins Other (See Comments)    Can't move/dizziness Has patient had a PCN reaction causing immediate rash, facial/tongue/throat swelling, SOB or lightheadedness with hypotension: No Has patient had a PCN reaction causing severe rash involving mucus membranes or skin necrosis: No Has patient had a PCN reaction that required hospitalization No Has patient had a PCN reaction occurring within the last 10 years: No If all of the above answers are "NO", then may proceed with Cephalosporin use.  Other reaction(s): Other Can't move Can't move    Home medications Prior to Admission medications   Medication Sig Start Date End Date Taking? Authorizing Provider  albuterol (PROVENTIL HFA;VENTOLIN HFA) 108 (90 Base) MCG/ACT inhaler Inhale 2 puffs into the lungs every 6 (six) hours as needed for wheezing or shortness of breath. 10/18/15  Yes Burns, Alexa R, MD  albuterol (PROVENTIL) (5 MG/ML) 0.5% nebulizer solution Take 0.5 mLs (2.5 mg total) by nebulization every 6 (six) hours as needed for wheezing or shortness of breath. 10/18/15  Yes Burns, Alexa R, MD  amLODipine (NORVASC) 10 MG tablet Take 10 mg by mouth 2 (two) times daily.    Yes [provider]  atorvastatin (LIPITOR) 80 MG tablet Take 1 tablet (80 mg total) by  mouth daily. Patient taking differently: Take 40 mg by mouth 2 (two) times daily.  04/14/18 07/13/18 Yes Hochrein, Jeneen Rinks, MD  KLOR-CON M20 20 MEQ tablet TAKE 1 TABLET BY MOUTH TWICE A DAY Patient taking differently: Take 20 mEq by mouth daily.  04/08/18  Yes Minus Breeding, MD  tamsulosin (FLOMAX) 0.4 MG CAPS capsule Take 1 capsule (0.4 mg total) by mouth daily. 4/00/86  Yes Leighton Ruff, MD   Liver Function Tests Recent Labs  Lab 06/05/18 0258  AST 26  ALT 18  ALKPHOS 78  BILITOT 1.7*  PROT 6.8  ALBUMIN 3.2*   No results for input(s): LIPASE, AMYLASE in the last 168 hours. CBC Recent Labs  Lab 06/05/18 0258 06/06/18 0330 06/07/18 0412  WBC 13.4* 8.6 6.9  NEUTROABS 12.5*  --   --   HGB 8.3* 8.0* 7.1*  HCT 25.8* 24.9* 21.8*  MCV 98.1 98.4 98.6  PLT 302 288 427   Basic Metabolic Panel Recent Labs  Lab 06/05/18 0258 06/06/18 0330 06/07/18 0412  NA 135 137 139  K 3.2* 3.4* 3.8  CL 99 103 107  CO2 22 21* 22  GLUCOSE 146* 103* 95  BUN 51* 56* 64*  CREATININE 3.72* 4.84* 5.99*  CALCIUM 8.8* 8.6* 8.5*   Iron/TIBC/Ferritin/ %Sat No results found for: IRON, TIBC, FERRITIN, IRONPCTSAT  Vitals:   06/06/18 2126 06/07/18 0049 06/07/18 0423 06/07/18 0600  BP: 133/85 (!) 135/91 134/86 (!) 132/92  Pulse: 66 66  62  Resp: (!) _0 Temp:  97.7 F (36.5 C) 98.3 F (36.8 C)   TempSrc:  Oral Oral   SpO2: 96% 99%  99%  Weight:      Height:        Exam Gen alert, talkative, no distress, mobile No rash, cyanosis or gangrene Sclera anicteric, throat clear  No jvd or bruits Chest clear bilat to bases RRR soft sem no RG Abd soft ntnd no mass or ascites +bs obese GU normal male w/ foley in place draining large amts clear yellow urine MS no joint effusions or deformity Ext 2+ bilat pretib edema, no wounds or ulcers Neuro is alert, Ox 3 , nf no asterixis    date     Creat  eGFR  2015- feb 2018  0.8- 1.1  sept 2018- may 2019  1.1- 1.56   sept 11, 2019   1.56   sept 20 - 28, 2019  2.1- 3.1    oct 4    3.72  oct 5    4.84  oct 6    5.99   Home meds:  - amlodipine 10 bid/ KDur 20 qd/ atorvastatin 40 bid/ albuterol mdi prn/ flomax 0.4   Renal US 06/07/18  R kidney = 14.5 cm. Normal cortical thickness. Upper normal echogenicity. Cysts/ nodules/ complicated some of them , see report Left Kidney= 16.7 cm, normal cortical thickness. Minimally increased cortical echogenicity. Cysts simple and complex, additional mass upper pole, see report. Marked prostatic enlargement, 9.7 x 4.5 x6.8 cm   UA 05/30/18 > clear , yellow, 30 prot, 1.010, 20-50 rbc/ 6-10 wbc  UA 06/05/18 > turbid, yellow, 100 prot, many bact 6-10 rbc/ >50 wbc  Urine cx 10/4 > +100K enterococcus faecalis/ +10K col acinetobacter baumanni  Blood cx 10/4 1/2 + for acinetobacter baumanni  CXR 10/4 - CM, no acute  Impression: 1. AKI on CKD 3 - baseline creat 1.5- 2.0.   Came in mild septic picture from gram negative urosepsis. Pt recovered but creat worsening daily despite foley placement on 10/5 for suspected urinary retention.  No nephrotoxins, only 1 dose vanc given, no contrast/ acei/ ARB. Renal US today no hydro. Could be ATN / hemodynamic related to sepsis episode, or could be obstruction not recovering yet.  Not uremic, continue to watch, supportive care.  Will follow. Repeat UA but urine is grossly clear, doubt GN.  However has progressive renal failure over the last 1 year. Will send off some serologies and myeloma studies. OK to continue IVF's for now.  2. Renal masses - per  [place], will need to be addressed at some point if not done already 3. BPH 4. CAD - medical Rx 5. COPD 6. PAF 7. HTN - bp's stable, home norvasc is on hold 8. Anemia - Hb 7.1, per primary  Plan - as above  Kelly Splinter MD [place] pager [phone]   06/07/2018, 4:08 PM

## 2018-06-08 LAB — MPO/PR-3 (ANCA) ANTIBODIES: ANCA PROTEINASE 3: 7.2 U/mL — AB (ref 0.0–3.5)

## 2018-06-08 LAB — RENAL FUNCTION PANEL
ANION GAP: 9 (ref 5–15)
Albumin: 2.9 g/dL — ABNORMAL LOW (ref 3.5–5.0)
BUN: 66 mg/dL — ABNORMAL HIGH (ref 8–23)
CO2: 22 mmol/L (ref 22–32)
Calcium: 8.2 mg/dL — ABNORMAL LOW (ref 8.9–10.3)
Chloride: 105 mmol/L (ref 98–111)
Creatinine, Ser: 5.91 mg/dL — ABNORMAL HIGH (ref 0.61–1.24)
GFR calc Af Amer: 10 mL/min — ABNORMAL LOW (ref >60)
GFR calc non Af Amer: 8 mL/min — ABNORMAL LOW (ref >60)
GLUCOSE: 101 mg/dL — AB (ref 70–99)
POTASSIUM: 3.5 mmol/L (ref 3.5–5.1)
Phosphorus: 4.1 mg/dL (ref 2.5–4.6)
SODIUM: 136 mmol/L (ref 135–145)

## 2018-06-08 LAB — URINE CULTURE

## 2018-06-08 LAB — HEPATITIS PANEL, ACUTE
HCV Ab: <0.1 s/co ratio (ref 0.0–0.9)
HEP A IGM: NEGATIVE
Hep B C IgM: NEGATIVE
Hepatitis B Surface Ag: NEGATIVE

## 2018-06-08 LAB — CBC
HCT: 24.1 % — ABNORMAL LOW (ref 39.0–52.0)
HEMOGLOBIN: 7.6 g/dL — AB (ref 13.0–17.0)
MCH: 31.3 pg (ref 26.0–34.0)
MCHC: 31.5 g/dL (ref 30.0–36.0)
MCV: 99.2 fL (ref 78.0–100.0)
Platelets: 295 10*3/uL (ref 150–400)
RBC: 2.43 MIL/uL — ABNORMAL LOW (ref 4.22–5.81)
RDW: 12.7 % (ref 11.5–15.5)
WBC: 6.6 10*3/uL (ref 4.0–10.5)

## 2018-06-08 LAB — GLUCOSE, CAPILLARY
Glucose-Capillary: 95 mg/dL (ref 70–99)
Glucose-Capillary: 101 mg/dL — ABNORMAL HIGH (ref 70–99)

## 2018-06-08 LAB — ANTINUCLEAR ANTIBODIES, IFA: ANA Ab, IFA: NEGATIVE

## 2018-06-08 MED ORDER — INSULIN ASPART 100 UNIT/ML ~~LOC~~ SOLN: 0.0000 [IU] | Freq: Every day | SUBCUTANEOUS | Status: DC

## 2018-06-08 MED ORDER — INSULIN ASPART 100 UNIT/ML ~~LOC~~ SOLN: 0.0000 [IU] | Freq: Three times a day (TID) | SUBCUTANEOUS | Status: DC

## 2018-06-08 MED ORDER — SODIUM CHLORIDE 0.9 % IV SOLN
3.0000 g | INTRAVENOUS | Status: DC
  Filled 2018-06-08: qty 3

## 2018-06-08 MED ORDER — SODIUM CHLORIDE 0.9 % IV SOLN
3.0000 g | Freq: Two times a day (BID) | INTRAVENOUS | Status: AC
  Administered 2018-06-08 – 2018-06-13 (×11): 3 g via INTRAVENOUS
  Filled 2018-06-08 (×12): qty 3

## 2018-06-08 NOTE — Progress Notes (Signed)
Patient complained of low blood sugar and needed sugary drink. CBG checked and was 95 mg/dl. Patient offered cranberry juice per his choice after the CBG. Patient indicated to this RN that he checks his blood sugar two times a day and hasn't been checked since he came to this hospital. Will pass this message on to the incoming RN to notify the rounding MD to look into this. No acute distress noted. Patient has lots of concerns to communicate to the doctor today. Will continue to monitor.

## 2018-06-08 NOTE — Progress Notes (Signed)
Admit: 06/05/2018 LOS: 3  [name] [name] with AoCKD3 (BL 1.5-2.0) likely from acinetobacter uti+sepsis and/or obstructive uropathy  Subjective:  . AFebrile, BPs stable  . Feels better, eatign well . 2.3L UOP yesterday . SCr stable today from yesterday, K 3.5 . Meropenem for infection  10/06 0701 - 10/07 0700 In: 1180 [P.O.:180; I.V.:1000] Out: 2300 [Urine:2300]  Filed Weights   06/05/18 0256 06/05/18 1251  Weight: 99.8 kg 99.8 kg    Scheduled Meds: . finasteride  5 mg Oral Daily  . heparin  5,000 Units Subcutaneous Q8H  . tamsulosin  0.4 mg Oral QHS   Continuous Infusions: . sodium chloride 75 mL/hr at 06/08/18 0247  . meropenem (MERREM) IV     PRN Meds:.acetaminophen **OR** acetaminophen, albuterol, HYDROcodone-acetaminophen, metoprolol tartrate, ondansetron **OR** ondansetron (ZOFRAN) IV, senna-docusate  Current Labs: reviewed  SPEP + SFLC pending C3, C4, ANAI, ANCA, Hep Panel pending  Physical Exam:  Blood pressure (!) 165/103, pulse (!) 58, temperature 97.7 F (36.5 C), temperature source Oral, resp. rate 17, height 5\' 6"  (1.676 m), weight 99.8 kg, SpO2 100 %. NAD, sitting in chair RRR nl s1s2 CtAB Trace Eemea No rashes/ Nonfocal, CN2-12 intace EOMI NCAT  A 1. AoCKD3 likely from uti+ sepsps and/or obstruction req foley, not UOP excellent and SCr stable. Serologies pending 2. BPH with retention, on flomax/proscar, with foley 3. Acinetobacter bacteremia and UTI: on meropenem 4. Anemia, stable today related to recent acute issues 5. CAD 6. 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 Cont suppprtove care . Daily weights, Daily Renal Panel, Strict I/Os, Avoid nephrotoxins (NSAIDs, judicious IV Contrast)  . Anticipate recovery in next few days . Medication Issues; o Preferred narcotic agents for pain control are hydromorphone, fentanyl, and methadone. Morphine should not be used.  o Baclofen should be avoided o Avoid oral sodium  phosphate and magnesium citrate based laxatives / bowel preps    [name] MD 06/08/2018, 10:21 AM  Recent Labs  Lab 06/06/18 0330 06/07/18 0412 06/08/18 0553  NA 137 139 136  K 3.4* 3.8 3.5  CL 103 107 105  CO2 21* 22 22  GLUCOSE 103* 95 101*  BUN 56* 64* 66*  CREATININE 4.84* 5.99* 5.91*  CALCIUM 8.6* 8.5* 8.2*  PHOS  --   --  4.1   Recent Labs  Lab 06/05/18 0258 06/06/18 0330 06/07/18 0412 06/08/18 0553  WBC 13.4* 8.6 6.9 6.6  NEUTROABS 12.5*  --   --   --   HGB 8.3* 8.0* 7.1* 7.6*  HCT 25.8* 24.9* 21.8* 24.1*  MCV 98.1 98.4 98.6 99.2  PLT 302 288 284 295

## 2018-06-08 NOTE — Progress Notes (Addendum)
PROGRESS NOTE        PATIENT DETAILS Name: [name] [name] Age: 72 y.o. Sex: male Date of Birth: 1946-06-20 Admit Date: 06/05/2018 Admitting Physician Albertine Patricia, MD HCW:[id number], Christean Grief, MD  Brief Narrative: Patient is a 72 y.o. male with history of CAD, hypertension, PAF not on anticoagulation-recent right hemicolectomy for a cecal mass-just discharged by the general surgical service on 1/51-[id number] course was complicated by development of obstructive uropathy and AKI ( discharged with a Foley catheter, successful outpatient voiding trial on 10/1)-presented to the hospital with sepsis secondary to Acinetobacter bacteremia, acute kidney injury.  See below for further details.  Subjective: Does not want to remain hospitalized any longer-"God will take care of me".  Denies any chest pain or shortness of breath-feels so much better that he wants to go home.  Assessment/Plan: Sepsis secondary to Acinetobacter bacteremia along with Acinetobacter and enterococcal UTI: Probably related to recent Foley catheter placement-following IV fluids/IV antibiotics-significant improvement in sepsis pathophysiology has resolved.  Blood cultures positive for Acinetobacter, urine culture growing enterococcus and Acinetobacter.  Case discussed with ID MD-Dr. Minta Balsam will transition to [facility] called lab to see if they can add on sensitivities to amoxicillin/ampicillin.  Although improved-he is not yet stable to discharge-continues to have significant renal failure.   AKI on CKD stage III: AKI hemodynamically mediated in the setting of sepsis, hypotension.  Due to concern for obstructive uropathy (enlarged prostate) Foley catheter placed on 10/5.  Creatinine seems to have plateaued-and now downtrending.  Volume status appears reasonable.  Nephrology following.  Hypokalemia: Replace-electrolytes periodically  BPH with recent urinary retention/obstructive uropathy requiring Foley  catheter placement: Foley catheter inserted on 10/5 due to worsening renal function and concern for obstructive uropathy which could be contributing-thankfully creatinine now seems to be downtrending-remains on Flomax and Proscar.  Once creatinine improves further-we will perform a voiding trial.   Anemia: Multifactorial-has anemia of chronic disease at baseline-worsened by acute illness and IV fluid dilution.  Hemoglobin stable-no indication to transfuse at this point-he is asymptomatic.    Chronic diastolic heart failure: Appears to have mild edema-but otherwise appears stable-cautiously continue with IV fluids-decrease IV fluids to 50 cc an hour.    Hypertension: Blood pressure relatively stable-continue to hold all antihypertensives.     CAD: No anginal symptoms-follow.  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  DVT Prophylaxis: Prophylactic Heparin   Code Status: Full code  Family Communication: None at bedside  Disposition Plan: Remain inpatient  Antimicrobial agents: Anti-infectives (From admission, onward)   Start     Dose/Rate Route Frequency Ordered Stop   06/08/18 1200  Ampicillin-Sulbactam (UNASYN) 3 g in sodium chloride 0.9 % 100 mL IVPB     3 g 200 mL/hr over 30 Minutes Intravenous Every 12 hours 06/08/18 1051     06/08/18 1100  Ampicillin-Sulbactam (UNASYN) 3 g in sodium chloride 0.9 % 100 mL IVPB  Status:  Discontinued     3 g 200 mL/hr over 30 Minutes Intravenous Every 24 hours 06/08/18 1049 06/08/18 1051   06/08/18 1000  meropenem (MERREM) 1 g in sodium chloride 0.9 % 100 mL IVPB  Status:  Discontinued     1 g 200 mL/hr over 30 Minutes Intravenous Every 24 hours 06/07/18 1303 06/08/18 1026   06/06/18 1345  meropenem (MERREM) 1 g in sodium  chloride 0.9 % 100 mL IVPB  Status:  Discontinued     1 g 200 mL/hr over 30 Minutes Intravenous Every 12 hours 06/06/18 1333 06/07/18  1303   06/06/18 0800  ceFEPIme (MAXIPIME) 1 g in sodium chloride 0.9 % 100 mL IVPB  Status:  Discontinued     1 g 200 mL/hr over 30 Minutes Intravenous Every 24 hours 06/05/18 1056 06/06/18 1333   06/05/18 0900  vancomycin (VANCOCIN) 2,000 mg in sodium chloride 0.9 % 500 mL IVPB     2,000 mg 250 mL/hr over 120 Minutes Intravenous  Once 06/05/18 0816 06/05/18 1149   06/05/18 0730  ceFEPIme (MAXIPIME) 2 g in sodium chloride 0.9 % 100 mL IVPB     2 g 200 mL/hr over 30 Minutes Intravenous  Once 06/05/18 0720 06/05/18 0833   06/05/18 0730  metroNIDAZOLE (FLAGYL) IVPB 500 mg  Status:  Discontinued     500 mg 100 mL/hr over 60 Minutes Intravenous Every 8 hours 06/05/18 0720 06/05/18 1035   06/05/18 0730  vancomycin (VANCOCIN) IVPB 1000 mg/200 mL premix  Status:  Discontinued     1,000 mg 200 mL/hr over 60 Minutes Intravenous  Once 06/05/18 0720 06/05/18 0816      Procedures: None  CONSULTS:  None  Time spent: 25 minutes-Greater than 50% of this time was spent in counseling, explanation of diagnosis, planning of further management, and coordination of care.  MEDICATIONS: Scheduled Meds: . finasteride  5 mg Oral Daily  . heparin  5,000 Units Subcutaneous Q8H  . insulin aspart  0-5 Units Subcutaneous QHS  . insulin aspart  0-9 Units Subcutaneous TID WC  . tamsulosin  0.4 mg Oral QHS   Continuous Infusions: . sodium chloride 75 mL/hr at 06/08/18 0247  . ampicillin-sulbactam (UNASYN) IV 3 g (06/08/18 1326)   PRN Meds:.acetaminophen **OR** acetaminophen, albuterol, HYDROcodone-acetaminophen, metoprolol tartrate, ondansetron **OR** ondansetron (ZOFRAN) IV, senna-docusate   PHYSICAL EXAM: Vital signs: Vitals:   06/08/18 0407 06/08/18 0423 06/08/18 1111 06/08/18 1325  BP:  (!) 165/103 (!) 142/93 (!) 143/95  Pulse:  (!) 58 70 62  Resp:  17 18 16   Temp: 97.7 F (36.5 C)   97.7 F (36.5 C)  TempSrc: Oral   Oral  SpO2:  100% (!) 89% 100%  Weight:      Height:       Filed Weights    06/05/18 0256 06/05/18 1251  Weight: 99.8 kg 99.8 kg   Body mass index is 35.51 kg/m.   General appearance:Awake, alert, not in any distress.  Eyes:no scleral icterus. HEENT: Atraumatic and Normocephalic Neck: supple, no JVD. Resp:Good air entry bilaterally,no rales or rhonchi CVS: S1 S2 regular, no murmurs.  GI: Bowel sounds present, Non tender and not distended with no gaurding, rigidity or rebound. Extremities: B/L Lower Ext shows trace edema, both legs are warm to touch Neurology:  Non focal Psychiatric: Normal judgment and insight. Normal mood. Musculoskeletal:No digital cyanosis Skin:No Rash, warm and dry Wounds:N/A  I have personally reviewed following labs and imaging studies  LABORATORY DATA: CBC: Recent Labs  Lab 06/05/18 0258 06/06/18 0330 06/07/18 0412 06/08/18 0553  WBC 13.4* 8.6 6.9 6.6  NEUTROABS 12.5*  --   --   --   HGB 8.3* 8.0* 7.1* 7.6*  HCT 25.8* 24.9* 21.8* 24.1*  MCV 98.1 98.4 98.6 99.2  PLT 302 288 284 034    Basic Metabolic Panel: Recent Labs  Lab 06/05/18 0258 06/06/18 0330 06/07/18 0412 06/08/18 0553  NA 135 137  139 136  K 3.2* 3.4* 3.8 3.5  CL 99 103 107 105  CO2 22 21* 22 22  GLUCOSE 146* 103* 95 101*  BUN 51* 56* 64* 66*  CREATININE 3.72* 4.84* 5.99* 5.91*  CALCIUM 8.8* 8.6* 8.5* 8.2*  PHOS  --   --   --  4.1    GFR: Estimated Creatinine Clearance: 12.1 mL/min (A) (by C-G formula based on SCr of 5.91 mg/dL (H)).  Liver Function Tests: Recent Labs  Lab 06/05/18 0258 06/08/18 0553  AST 26  --   ALT 18  --   ALKPHOS 78  --   BILITOT 1.7*  --   PROT 6.8  --   ALBUMIN 3.2* 2.9*   No results for input(s): LIPASE, AMYLASE in the last 168 hours. No results for input(s): AMMONIA in the last 168 hours.  Coagulation Profile: No results for input(s): INR, PROTIME in the last 168 hours.  Cardiac Enzymes: No results for input(s): CKTOTAL, CKMB, CKMBINDEX, TROPONINI in the last 168 hours.  BNP (last 3 results) No  results for input(s): PROBNP in the last 8760 hours.  HbA1C: No results for input(s): HGBA1C in the last 72 hours.  CBG: Recent Labs  Lab 06/08/18 0545  GLUCAP 95    Lipid Profile: No results for input(s): CHOL, HDL, LDLCALC, TRIG, CHOLHDL, LDLDIRECT in the last 72 hours.  Thyroid Function Tests: No results for input(s): TSH, T4TOTAL, FREET4, T3FREE, THYROIDAB in the last 72 hours.  Anemia Panel: No results for input(s): VITAMINB12, FOLATE, FERRITIN, TIBC, IRON, RETICCTPCT in the last 72 hours.  Urine analysis:    Component Value Date/Time   COLORURINE STRAW (A) 06/07/2018 1643   APPEARANCEUR CLEAR 06/07/2018 1643   LABSPEC 1.008 06/07/2018 1643   PHURINE 6.0 06/07/2018 1643   GLUCOSEU NEGATIVE 06/07/2018 1643   HGBUR LARGE (A) 06/07/2018 1643   BILIRUBINUR NEGATIVE 06/07/2018 1643   KETONESUR NEGATIVE 06/07/2018 1643   PROTEINUR 30 (A) 06/07/2018 1643   NITRITE NEGATIVE 06/07/2018 1643   LEUKOCYTESUR MODERATE (A) 06/07/2018 1643    Sepsis Labs: Lactic Acid, Venous    Component Value Date/Time   LATICACIDVEN 1.63 06/05/2018 0310    MICROBIOLOGY: Recent Results (from the past 240 hour(s))  Urine culture     Status: None   Collection Time: 05/30/18 10:56 AM  Result Value Ref Range Status   Specimen Description   Final    URINE, RANDOM Performed at [place] [address] [place], [place] [address]    Special Requests   Final    NONE Performed at [facility], [place] [address] [place], [place] [address]    Culture   Final    NO GROWTH Performed at [facility], [place] [address], [place], [place] [address]    Report Status 05/31/2018 FINAL  Final  Blood culture (routine x 2)     Status: Abnormal   Collection Time: 06/05/18  2:52 AM  Result Value Ref Range Status   Specimen Description BLOOD LEFT HAND  Final   Special Requests   Final    BOTTLES DRAWN AEROBIC AND ANAEROBIC Blood Culture adequate volume    Culture  Setup Time   Final    IN BOTH AEROBIC AND ANAEROBIC BOTTLES GRAM NEGATIVE RODS CRITICAL RESULT CALLED TO, READ BACK BY AND VERIFIED WITH: L SEAY PHARMD 06/06/18 1225 JDW Performed at [facility], [address], [place], [place] [address]    Culture ACINETOBACTER CALCOACETICUS/BAUMANNII COMPLEX (A)  Final   Report Status 06/07/2018 FINAL  Final   Organism ID, Bacteria ACINETOBACTER CALCOACETICUS/BAUMANNII COMPLEX  Final      Susceptibility   Acinetobacter calcoaceticus/baumannii complex - MIC*    CEFTAZIDIME 8 SENSITIVE Sensitive     CEFTRIAXONE 16 INTERMEDIATE Intermediate     CIPROFLOXACIN 0.5 SENSITIVE Sensitive     GENTAMICIN 4 SENSITIVE Sensitive     IMIPENEM <=0.25 SENSITIVE Sensitive     PIP/TAZO <=4 SENSITIVE Sensitive     TRIMETH/SULFA <=20 SENSITIVE Sensitive     CEFEPIME 8 SENSITIVE Sensitive     AMPICILLIN/SULBACTAM <=2 SENSITIVE Sensitive     * ACINETOBACTER CALCOACETICUS/BAUMANNII COMPLEX  Blood Culture ID Panel (Reflexed)     Status: None   Collection Time: 06/05/18  2:52 AM  Result Value Ref Range Status   Enterococcus species NOT DETECTED NOT DETECTED Final    Comment: CRITICAL RESULT CALLED TO, READ BACK BY AND VERIFIED WITH: L SEAY PHARMD 06/06/18 1225 JDW    Listeria monocytogenes NOT DETECTED NOT DETECTED Final   Staphylococcus species NOT DETECTED NOT DETECTED Final   Staphylococcus aureus (BCID) NOT DETECTED NOT DETECTED Final   Streptococcus species NOT DETECTED NOT DETECTED Final   Streptococcus agalactiae NOT DETECTED NOT DETECTED Final   Streptococcus pneumoniae NOT DETECTED NOT DETECTED Final   Streptococcus pyogenes NOT DETECTED NOT DETECTED Final   Acinetobacter baumannii NOT DETECTED NOT DETECTED Final   Enterobacteriaceae species NOT DETECTED NOT DETECTED Final   Enterobacter cloacae complex NOT DETECTED NOT DETECTED Final   Escherichia coli NOT DETECTED NOT DETECTED Final   Klebsiella oxytoca NOT DETECTED NOT DETECTED Final    Klebsiella pneumoniae NOT DETECTED NOT DETECTED Final   Proteus species NOT DETECTED NOT DETECTED Final   Serratia marcescens NOT DETECTED NOT DETECTED Final   Haemophilus influenzae NOT DETECTED NOT DETECTED Final   Neisseria meningitidis NOT DETECTED NOT DETECTED Final   Pseudomonas aeruginosa NOT DETECTED NOT DETECTED Final   Candida albicans NOT DETECTED NOT DETECTED Final   Candida glabrata NOT DETECTED NOT DETECTED Final   Candida krusei NOT DETECTED NOT DETECTED Final   Candida parapsilosis NOT DETECTED NOT DETECTED Final   Candida tropicalis NOT DETECTED NOT DETECTED Final    Comment: Performed at [facility], [place] [address], [place], [place] [address]  Blood culture (routine x 2)     Status: None (Preliminary result)   Collection Time: 06/05/18  3:00 AM  Result Value Ref Range Status   Specimen Description BLOOD RIGHT ANTECUBITAL  Final   Special Requests   Final    BOTTLES DRAWN AEROBIC AND ANAEROBIC Blood Culture adequate volume   Culture   Final    NO GROWTH 3 DAYS Performed at [facility], [place] [address], [place], [place] [address]    Report Status PENDING  Incomplete  Culture, Urine     Status: Abnormal   Collection Time: 06/05/18  8:00 AM  Result Value Ref Range Status   Specimen Description URINE, RANDOM  Final   Special Requests   Final    NONE Performed at [facility], [place] [address], [place],  [address]    Culture (A)  Final    >=100,000 COLONIES/mL ENTEROCOCCUS FAECALIS 10,000 COLONIES/mL ACINETOBACTER CALCOACETICUS/BAUMANNII COMPLEX    Report Status 06/08/2018 FINAL  Final   Organism ID, Bacteria ENTEROCOCCUS FAECALIS (A)  Final   Organism ID, Bacteria ACINETOBACTER CALCOACETICUS/BAUMANNII COMPLEX (A)  Final      Susceptibility   Acinetobacter calcoaceticus/baumannii complex -  MIC*    CEFTAZIDIME 8 SENSITIVE Sensitive     CEFTRIAXONE 16 INTERMEDIATE Intermediate     CIPROFLOXACIN 1 SENSITIVE Sensitive     GENTAMICIN 4  SENSITIVE Sensitive     IMIPENEM <=0.25 SENSITIVE Sensitive     PIP/TAZO >=128 RESISTANT Resistant     TRIMETH/SULFA <=20 SENSITIVE Sensitive     CEFEPIME 8 SENSITIVE Sensitive     AMPICILLIN/SULBACTAM <=2 SENSITIVE Sensitive     * 10,000 COLONIES/mL ACINETOBACTER CALCOACETICUS/BAUMANNII COMPLEX   Enterococcus faecalis - MIC*    AMPICILLIN <=2 SENSITIVE Sensitive     LEVOFLOXACIN 1 SENSITIVE Sensitive     NITROFURANTOIN <=16 SENSITIVE Sensitive     VANCOMYCIN 1 SENSITIVE Sensitive     * >=100,000 COLONIES/mL ENTEROCOCCUS FAECALIS  MRSA PCR Screening     Status: None   Collection Time: 06/05/18  1:04 PM  Result Value Ref Range Status   MRSA by PCR NEGATIVE NEGATIVE Final    Comment:        The GeneXpert MRSA Assay (FDA approved for NASAL specimens only), is one component of a comprehensive MRSA colonization surveillance program. It is not intended to diagnose MRSA infection nor to guide or monitor treatment for MRSA infections. Performed at [facility], [place] [address] [place], [place] [address]     RADIOLOGY STUDIES/RESULTS: Ct Abdomen Pelvis Wo Contrast  Result Date: 06/05/2018 CLINICAL DATA:  Abdominal pain, fever.  Recent partial colectomy. EXAM: CT ABDOMEN AND PELVIS WITHOUT CONTRAST TECHNIQUE: Multidetector CT imaging of the abdomen and pelvis was performed following the standard protocol without IV contrast. COMPARISON:  05/30/2018 FINDINGS: Lower chest: Cardiomegaly.  Bibasilar atelectasis.  No effusions. Hepatobiliary: Small scattered hepatic cysts, small scattered hepatic low-density lesions throughout the liver, likely cysts although these cannot be characterized fully without intravenous contrast. Gallbladder unremarkable. Pancreas: No focal abnormality or ductal dilatation. Spleen: No focal abnormality.  Normal size. Adrenals/Urinary Tract: Numerous low-density, mixed density and high-density masses throughout the kidney are stable since recent study and cannot be  characterized fully without intravenous contrast. At least 2 of these masses were shown on prior contrast enhanced CT to be suspicious for renal cell carcinoma. No hydronephrosis. Adrenal glands unremarkable. Urinary bladder decompressed and mildly thick walled, likely related to prostate enlargement. Stomach/Bowel: Descending colonic and sigmoid diverticulosis. Postoperative changes in the right colon from right hemicolectomy, stable. Again seen is the ventral hernia containing a portion of the mid transverse colon which appears unremarkable. No evidence of obstruction or bowel wall thickening. Stomach and small bowel grossly unremarkable. No evidence of bowel obstruction. Vascular/Lymphatic: Aortic atherosclerosis. No enlarged abdominal or pelvic lymph nodes. Reproductive: Marked enlargement of the prostate. Other: No free fluid or free air. Continued wall thickening within the periphery of the midline supraumbilical ventral hernia which also contains the transverse colon. Again, no underlying bowel wall thickening. This has a stable appearance since prior study. Musculoskeletal: No acute bony abnormality. Diffuse degenerative changes in the lumbar spine. IMPRESSION: Stable appearance of prior right hemicolectomy. Stable supraumbilical ventral hernia containing a portion of the transverse colon. The wall of the ventral hernia again appears thickened and indistinct, but underlying bowel does not appear abnormal. Scattered diverticulosis.  No active diverticulitis. Marked prostate enlargement with bladder wall thickening. Multiple renal lesions. At least 2 of which were shown on prior contrast-enhanced CT to be suspicious for renal cell carcinoma. No change. Scattered hepatic low-density lesions cannot be characterized without intravenous contrast, likely cysts. Cardiomegaly, bibasilar atelectasis. Aortic atherosclerosis. Electronically Signed  By: Rolm Baptise M.D.   On: 06/05/2018 08:22   Ct Abdomen Pelvis Wo  Contrast  Result Date: 05/30/2018 CLINICAL DATA:  Partial right colectomy, perform laparoscopically. EXAM: CT ABDOMEN AND PELVIS WITHOUT CONTRAST TECHNIQUE: Multidetector CT imaging of the abdomen and pelvis was performed following the standard protocol without IV contrast. COMPARISON:  March 24, 2018 FINDINGS: Lower chest: Cardiomegaly.  No other abnormalities. Hepatobiliary: Multiple low-attenuation lesions in the liver are most likely cysts, unchanged. The liver and gallbladder are otherwise unremarkable. Pancreas: Unremarkable. No pancreatic ductal dilatation or surrounding inflammatory changes. Spleen: Normal in size without focal abnormality. Adrenals/Urinary Tract: Adrenal glands are normal. A 4.1 cm apparently solid mass is seen in the upper pole of the left kidney measuring 3.9 cm previously. A 2.7 cm mass in the medial left kidney on series 2, image 28 is essentially unchanged and nonspecific. Several renal cysts are noted. There is a solid and cystic appearing mass in the lower pole of the right kidney difficult to compare to the previous study due to lack of contrast today. However, the maximum measurement is approximately 4.6 cm, unchanged. Several probable hyperdense cysts are noted. The bladder is decompressed with a Foley catheter. There is mild fat stranding adjacent to the bladder which is a new finding. No renal stones or hydronephrosis. No ureteral stones. Stomach/Bowel: The stomach and small bowel are normal. The patient is status post partial right colectomy scattered colonic diverticuli are noted. There is a new ventral hernia, located superior to the previously identified umbilical hernia. This is best seen on series 2, image 35. There is fat stranding within the hernia which likely contains omentum. It also contains a portion of transverse colon which does not appear to be inflamed. The wall of the hernia is thickened and inflamed. Surgical changes are seen in the right lower abdomen at the  site of right colectomy. There is some increased attenuation in the surrounding fat, likely postoperative. No collection to suggest abscess or leak. The appendix is been removed along with the right partial colectomy. Vascular/Lymphatic: Mild atherosclerotic changes in the aorta. No obvious adenopathy. Reproductive: Again noted is an enlarged prostate exerting mass effect on the posterior bladder. Other: No abdominal wall hernia or abnormality. No abdominopelvic ascites. Musculoskeletal: No acute or significant osseous findings. IMPRESSION: 1. At least 2 renal masses concerning for renal cell carcinoma. The masses were better assessed with a previous contrast-enhanced study. Recommend an MRI when the patient is able. 2. There is a new ventral hernia. The periphery of the hernia/hernia wall is thickened and inflamed. The hernia contains transverse colon which does not appear to be inflamed or obstructed. There is also likely some herniated omentum with associated fat stranding. This hernia is new compared to March 24, 2018. No associated bowel obstruction. 3. Inflammatory changes in the right lower quadrant, surrounding the site of right partial colectomy. These findings are likely postsurgical as surgery was only just over 1 week ago. No evidence of leak or abscess. 4. Atherosclerotic changes in the abdominal aorta. Electronically Signed   By: Dorise Bullion III M.D   On: 05/30/2018 12:24   Dg Chest 2 View  Result Date: 06/05/2018 CLINICAL DATA:  Shortness of breath. EXAM: CHEST - 2 VIEW COMPARISON:  Chest CT 05/19/2018 FINDINGS: Chronic cardiomegaly and aortic tortuosity. No pulmonary edema, pleural effusion, focal airspace disease or pneumothorax. No acute osseous abnormalities. IMPRESSION: Cardiomegaly without congestive failure or acute chest finding. Electronically Signed   By: Aurther Loft.D.  On: 06/05/2018 03:21   US Renal  Result Date: 06/06/2018 CLINICAL DATA:  Acute kidney injury, history  coronary artery disease, chronic diastolic CHF, COPD, type II diabetes mellitus, hypertension, hypertensive heart disease, prostate cancer, renal cell carcinoma EXAM: RENAL / URINARY TRACT ULTRASOUND COMPLETE COMPARISON:  CT abdomen and pelvis 06/05/2017 FINDINGS: Right Kidney: Length: 14.5 cm. Normal cortical thickness. Upper normal echogenicity. Cystic lesion at mid kidney, 2.7 x 2.2 x 3.0 cm, containing a few scattered low level internal echoes; this lesion is hyperdense on the prior CT exam. An additional nodule at the inferior pole measures 3.9 x 2.0 x 2.7 cm and demonstrates multiple septations and low-level scattered internal echogenicity. This lesion is mildly complicated and demonstrated low-attenuation and high attenuation components on the prior CT. No hydronephrosis or shadowing calcification. Left Kidney: Length: 16.7 cm. Normal cortical thickness. Minimally increased cortical echogenicity. Small simple cyst at inferior pole, 3.9 x 2.0 x 2.7 cm. Additional complicated cyst at upper pole 2.5 x 2.1 x 2.9 cm with a scattered internal echoes. Additional mass at upper pole appear solid, 4.9 x 4.5 x 4.5 cm, highly suspicious for a renal neoplasm. No hydronephrosis or shadowing calcification. Bladder: Normal appearing bladder. Marked prostatic enlargement, 9.7 x 4.5 x 6.8 cm IMPRESSION: Probable medical renal disease changes of the kidneys. BILATERAL simple renal cysts. Additional solid mass at upper pole LEFT kidney 4.9 cm greatest size most likely representing a solid renal neoplasm, demonstrated enhancement on a prior CT exam of 03/24/2018 and now demonstrates an interval increase in size. Additional complicated cystic lesions at the inferior pole of RIGHT kidney and upper pole LEFT kidney, indeterminate, cannot exclude renal neoplasms; consider MR characterization. Marked prostatic enlargement. Electronically Signed   By: Lavonia Dana M.D.   On: 06/06/2018 17:55   Ct Angio Chest Aorta W &/or Wo  Contrast  Result Date: 05/19/2018 CLINICAL DATA:  Yearly follow up thoracic aortic aneurysm. Hx of recent syncopal episodes in [place] and post prandial. EXAM: CT ANGIOGRAPHY CHEST WITH CONTRAST TECHNIQUE: Multidetector CT imaging of the chest was performed using the standard protocol during bolus administration of intravenous contrast. Multiplanar CT image reconstructions and MIPs were obtained to evaluate the vascular anatomy. CONTRAST:  167mL ISOVUE-370 IOPAMIDOL (ISOVUE-370) INJECTION 76% COMPARISON:  05/30/2017 and previous FINDINGS: Cardiovascular: Cardiomegaly. No pericardial effusion. Dilated central pulmonary arteries. Fair contrast opacification of pulmonary artery branches; the exam was not optimized for detection of pulmonary emboli. Mild scattered coronary calcifications. Good contrast opacification of the thoracic aorta without dissection or stenosis. Transverse diameters as follows: 5.2 cm sinuses of Valsalva 4.2 cm  sino-tubular junction 4.6 cm mid ascending (previously 4.3 cm) 4.3 cm distal ascending/proximal arch 3.4 cm distal arch 3.2 cm proximal descending 2.8 cm distal descending Bovine variant brachiocephalic arterial origin anatomy without proximal stenosis. Visualized proximal abdominal aorta unremarkable. Ectatic distal celiac axis up to 1.5 cm diameter. Mediastinum/Nodes: No hilar or mediastinal adenopathy. Lungs/Pleura: No pleural effusion.  No pneumothorax.  Lungs clear. Upper Abdomen: Nodular liver contour. Stable hepatic cysts. Exophytic 5.8 cm upper pole left renal lesion measuring above fluid attenuation. No acute findings. Musculoskeletal: 5 mm subcutaneous metallic density in the left anterior chest wall subcutaneous tissues. Mid and lower thoracic spondylitic change. No fracture or worrisome bone lesion. Review of the MIP images confirms the above findings. IMPRESSION: 1. 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 Recommend semi-annual imaging followup by CTA or MRA and referral  to cardiothoracic surgery if not already obtained. This recommendation follows 2010 ACCF/AHA/AATS/ACR/ASA/SCA/SCAI/SIR/STS/SVM Guidelines for the Diagnosis and Management of Patients With Thoracic Aortic Disease. Circulation. 2010; 121: Y694-W546 2. 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 Recommend dedicated renal protocol CT or MR with contrast. 3. Coronary calcifications. The severity of coronary artery disease and any potential stenosis cannot be assessed on this non-gated CT examination. Assessment for potential risk factor modification, dietary therapy or pharmacologic therapy may be warranted, if clinically indicated. 4. Nodular liver contour suggesting cirrhosis. Electronically Signed   By: Lucrezia Europe M.D.   On: 05/19/2018 13:45     LOS: 3 days   Oren Binet, MD  [facility]  If 7PM-7AM, please contact night-coverage  Please page via [email or web address]-click on MD name and type text message  06/08/2018, 2:32 PM

## 2018-06-08 NOTE — Progress Notes (Signed)
Pharmacy Antibiotic Note  [name] [name] is a 72 y.o. male admitted on 06/05/2018 with bacteremia/UTI.  Pharmacy has been consulted for Unasyn dosing.  ID: documented ampicillin, penicillin allergies as "can't move" - interviewed pt and pt was unclear but stated he believed that he fell due to medications - Afebrile, WBC WNL, Renal dosing: Scr 5.91  Plan: D/C meropenem 1g IV q24 Start Unasyn 3g q12h    Height: 5\' 6"  (167.6 cm) Weight: 220 lb (99.8 kg) IBW/kg (Calculated) : 63.8  Temp (24hrs), Avg:98.2 F (36.8 C), Min:97.7 F (36.5 C), Max:98.8 F (37.1 C)  Recent Labs  Lab 06/05/18 0258 06/05/18 0310 06/06/18 0330 06/07/18 0412 06/08/18 0553  WBC 13.4*  --  8.6 6.9 6.6  CREATININE 3.72*  --  4.84* 5.99* 5.91*  LATICACIDVEN  --  1.63  --   --   --     Estimated Creatinine Clearance: 12.1 mL/min (A) (by C-G formula based on SCr of 5.91 mg/dL (H)).    Allergies  Allergen Reactions  . Amoxicillin Other (See Comments)    Can't move Has patient had a PCN reaction causing immediate rash, facial/tongue/throat swelling, SOB or lightheadedness with hypotension: No Has patient had a PCN reaction causing severe rash involving mucus membranes or skin necrosis: No Has patient had a PCN reaction that required hospitalization No Has patient had a PCN reaction occurring within the last 10 years: No If all of the above answers are "NO", then may proceed with Cephalosporin use.   [name] Penicillins Other (See Comments)    Can't move/dizziness Has patient had a PCN reaction causing immediate rash, facial/tongue/throat swelling, SOB or lightheadedness with hypotension: No Has patient had a PCN reaction causing severe rash involving mucus membranes or skin necrosis: No Has patient had a PCN reaction that required hospitalization No Has patient had a PCN reaction occurring within the last 10 years: No If all of the above answers are "NO", then may proceed with Cephalosporin use.  Other  reaction(s): Other Can't move Can't move     Antimicrobials this admission: Vanc x1 >>10/4 Cefepime 10/4 >>  Meropenem 10/5 >> 10/7 Unasyn 10/7 >>   Dose adjustments this admission:  Microbiology results: Bcx 10/4 >> acinetobacter Ucx 10/4 >> acinetobacter, enterococcus faecalis   Carthel Castille S. Alford Highland, PharmD, BCPS Clinical Staff Pharmacist  Eilene Ghazi Stillinger 06/08/2018 1:16 PM

## 2018-06-08 NOTE — Progress Notes (Signed)
Physical Therapy Treatment Patient Details Name: [name] [name] MRN: [id number] DOB: 07/03/1946 Today's Date: 06/08/2018    History of Present Illness  [name] [name]  is a 72 y.o. male, past medical history of CAD, hypertension, hyperlipidemia, A. 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 Pt found to have soft BP, tachycardia, UTI, and worsening renal function in ED.     PT Comments    Patient received up in chair, pleasant and willing to participate in PT session today. Able to complete functional transfers and gait approximately 258f with IV pole and general S for safety, no physical assist given today. He is clearly feeling much better. He was left up in the chair with all needs met and questions/concerns addressed this afternoon.     Follow Up Recommendations  Home health PT;Supervision for mobility/OOB     Equipment Recommendations  Rolling walker with 5" wheels    Recommendations for Other Services       Precautions / Restrictions Precautions Precautions: Fall Restrictions Weight Bearing Restrictions: No    Mobility  Bed Mobility               General bed mobility comments: DNT, received up in chair   Transfers Overall transfer level: Needs assistance Equipment used: 1 person hand held assist Transfers: Sit to/from Stand Sit to Stand: Supervision         General transfer comment: S for safety, no physical assist given, no dizziness today   Ambulation/Gait Ambulation/Gait assistance: Supervision Gait Distance (Feet): 200 Feet Assistive device: IV Pole Gait Pattern/deviations:  Step-through pattern;Wide base of support;Drifts right/left Gait velocity: decreased   General Gait Details: gait distance much improved, no significant gait or balance deficits noted when ambulating in hallway with IV pole    Stairs             Wheelchair Mobility    Modified Rankin (Stroke Patients Only)       Balance Overall balance assessment: Needs assistance Sitting-balance support: No upper extremity supported;Feet supported Sitting balance-Leahy Scale: Good     Standing balance support: Single extremity supported;During functional activity Standing balance-Leahy Scale: Good                              Cognition Arousal/Alertness: Awake/alert Behavior During Therapy: WFL for tasks assessed/performed Overall Cognitive Status: Within Functional Limits for tasks assessed                                        Exercises      General Comments        Pertinent Vitals/Pain Pain Assessment: No/denies pain    Home Living                      Prior Function            PT Goals (current goals can now be found in the care plan section) Acute Rehab PT Goals Patient Stated Goal: feel better PT Goal Formulation: With patient Time For Goal Achievement: 06/20/18 Potential to  Achieve Goals: Good Progress towards PT goals: Progressing toward goals    Frequency    Min 3X/week      PT Plan Current plan remains appropriate    Co-evaluation              AM-PAC PT "6 Clicks" Daily Activity  Outcome Measure  Difficulty turning over in bed (including adjusting bedclothes, sheets and blankets)?: None Difficulty moving from lying on back to sitting on the side of the bed? : None Difficulty sitting down on and standing up from a chair with arms (e.g., wheelchair, bedside commode, etc,.)?: None Help needed moving to and from a bed to chair (including a wheelchair)?: None Help needed walking in hospital room?: A  Little Help needed climbing 3-5 steps with a railing? : A Little 6 Click Score: 22    End of Session   Activity Tolerance: Patient tolerated treatment well Patient left: in chair;with call bell/phone within reach   PT Visit Diagnosis: Dizziness and giddiness (R42);Other abnormalities of gait and mobility (R26.89)     Time: 3882-6666 PT Time Calculation (min) (ACUTE ONLY): 17 min  Charges:  $Gait Training: 8-22 mins                     Deniece Ree PT, DPT, CBIS  Supplemental Physical Therapist [place]    Pager [phone] Acute Rehab Office [phone]

## 2018-06-09 LAB — KAPPA/LAMBDA LIGHT CHAINS
KAPPA, LAMDA LIGHT CHAIN RATIO: 227.13 — AB (ref 0.26–1.65)
Kappa free light chain: 7790.4 mg/L — ABNORMAL HIGH (ref 3.3–19.4)
LAMDA FREE LIGHT CHAINS: 34.3 mg/L — AB (ref 5.7–26.3)

## 2018-06-09 LAB — BASIC METABOLIC PANEL
ANION GAP: 14 (ref 5–15)
BUN: 64 mg/dL — ABNORMAL HIGH (ref 8–23)
CHLORIDE: 105 mmol/L (ref 98–111)
CO2: 20 mmol/L — ABNORMAL LOW (ref 22–32)
Calcium: 8.5 mg/dL — ABNORMAL LOW (ref 8.9–10.3)
Creatinine, Ser: 5.91 mg/dL — ABNORMAL HIGH (ref 0.61–1.24)
GFR, EST AFRICAN AMERICAN: 10 mL/min — AB (ref >60)
GFR, EST NON AFRICAN AMERICAN: 8 mL/min — AB (ref >60)
Glucose, Bld: 140 mg/dL — ABNORMAL HIGH (ref 70–99)
POTASSIUM: 3.6 mmol/L (ref 3.5–5.1)
Sodium: 139 mmol/L (ref 135–145)

## 2018-06-09 LAB — C4 COMPLEMENT: Complement C4, Body Fluid: 29 mg/dL (ref 14–44)

## 2018-06-09 LAB — PROTEIN ELECTROPHORESIS, SERUM
A/G RATIO SPE: 0.8 (ref 0.7–1.7)
Albumin ELP: 2.8 g/dL — ABNORMAL LOW (ref 2.9–4.4)
Alpha-1-Globulin: 0.4 g/dL (ref 0.0–0.4)
Alpha-2-Globulin: 1.1 g/dL — ABNORMAL HIGH (ref 0.4–1.0)
Beta Globulin: 0.8 g/dL (ref 0.7–1.3)
GLOBULIN, TOTAL: 3.6 g/dL (ref 2.2–3.9)
Gamma Globulin: 1.3 g/dL (ref 0.4–1.8)
TOTAL PROTEIN ELP: 6.4 g/dL (ref 6.0–8.5)

## 2018-06-09 LAB — CBC
HCT: 23 % — ABNORMAL LOW (ref 39.0–52.0)
HEMOGLOBIN: 7.2 g/dL — AB (ref 13.0–17.0)
MCH: 31.4 pg (ref 26.0–34.0)
MCHC: 31.3 g/dL (ref 30.0–36.0)
MCV: 100.4 fL — ABNORMAL HIGH (ref 80.0–100.0)
PLATELETS: 267 10*3/uL (ref 150–400)
RBC: 2.29 MIL/uL — AB (ref 4.22–5.81)
RDW: 12.7 % (ref 11.5–15.5)
WBC: 6.5 10*3/uL (ref 4.0–10.5)

## 2018-06-09 LAB — GLUCOSE, CAPILLARY
GLUCOSE-CAPILLARY: 100 mg/dL — AB (ref 70–99)
GLUCOSE-CAPILLARY: 88 mg/dL (ref 70–99)
Glucose-Capillary: 116 mg/dL — ABNORMAL HIGH (ref 70–99)
Glucose-Capillary: 124 mg/dL — ABNORMAL HIGH (ref 70–99)

## 2018-06-09 LAB — C3 COMPLEMENT: C3 COMPLEMENT: 144 mg/dL (ref 82–167)

## 2018-06-09 MED ORDER — AMLODIPINE BESYLATE 5 MG PO TABS
5.0000 mg | ORAL_TABLET | Freq: Every day | ORAL | Status: DC
  Administered 2018-06-09 – 2018-06-14 (×6): 5 mg via ORAL
  Filled 2018-06-09 (×8): qty 1

## 2018-06-09 NOTE — Care Management Important Message (Signed)
Important Message  Patient Details  Name: [name] [name] MRN: [id number] Date of Birth: [birth year]   Medicare Important Message Given:  Yes    Rieley Khalsa Montine Circle 06/09/2018, 2:54 PM

## 2018-06-09 NOTE — Progress Notes (Signed)
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 Excellent, improved urine output yesterday . Serum creatinine stable at 5.9, BUN 64, potassium 3.6, bicarbonate 20 . Afebrile  10/07 0701 - 10/08 0700 In: 807.6 [P.O.:300; I.V.:307.6; IV Piggyback:200] Out: 4900 [Urine:4900]  Filed Weights   06/05/18 0256 06/05/18 1251 06/09/18 0500  Weight: 99.8 kg 99.8 kg 103.4 kg    Scheduled Meds: . finasteride  5 mg Oral Daily  . heparin  5,000 Units Subcutaneous Q8H  . insulin aspart  0-5 Units Subcutaneous QHS  . insulin aspart  0-9 Units Subcutaneous TID WC  . tamsulosin  0.4 mg Oral QHS   Continuous Infusions: . sodium chloride Stopped (06/09/18 0612)  . ampicillin-sulbactam (UNASYN) IV 3 g (06/08/18 2339)   PRN Meds:.acetaminophen **OR** acetaminophen, albuterol, HYDROcodone-acetaminophen, metoprolol tartrate, ondansetron **OR** ondansetron (ZOFRAN) IV, senna-docusate  Current Labs: reviewed  SPEP + SFLC pending C3, C4, ANAI, ANCA, Hep Panel pending  Physical Exam:  Blood pressure (!) 159/98, pulse (!) 59, temperature 98.6 F (37 C), temperature source Oral, resp. rate 18, height 5\' 6"  (1.676 m), weight 103.4 kg, SpO2 98 %. NAD, sitting in chair RRR nl s1s2 CtAB Trace Eemea No rashes/ Nonfocal, CN2-12 intace EOMI NCAT Foley catheter in place  A 1. AoCKD3 likely from uti+ sepsps and/or obstruction req foley, not UOP excellent and SCr stable. Serologies pending 2. BPH with retention, on flomax/proscar, with foley 3. Acinetobacter bacteremia and UTI: on unasyn, ID following 4. Anemia, stable related to recent acute issues 5. CAD 6. 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 Cont suppprtove care . No indications for dialysis . With improved urine output hopeful to see recovery of GFR within the next 24 to 48 hours . Will need a trial of voiding  prior to discharge . Daily weights, Daily Renal Panel, Strict I/Os, Avoid nephrotoxins (NSAIDs, judicious IV Contrast)  . Medication Issues; o Preferred narcotic agents for pain control are hydromorphone, fentanyl, and methadone. Morphine should not be used.  o Baclofen should be avoided o Avoid oral sodium phosphate and magnesium citrate based laxatives / bowel preps    [name] MD 06/09/2018, 10:38 AM  Recent Labs  Lab 06/06/18 0330 06/07/18 0412 06/08/18 0553  NA 137 139 136  K 3.4* 3.8 3.5  CL 103 107 105  CO2 21* 22 22  GLUCOSE 103* 95 101*  BUN 56* 64* 66*  CREATININE 4.84* 5.99* 5.91*  CALCIUM 8.6* 8.5* 8.2*  PHOS  --   --  4.1   Recent Labs  Lab 06/05/18 0258 06/06/18 0330 06/07/18 0412 06/08/18 0553  WBC 13.4* 8.6 6.9 6.6  NEUTROABS 12.5*  --   --   --   HGB 8.3* 8.0* 7.1* 7.6*  HCT 25.8* 24.9* 21.8* 24.1*  MCV 98.1 98.4 98.6 99.2  PLT 302 288 284 295

## 2018-06-09 NOTE — Progress Notes (Signed)
PROGRESS NOTE        PATIENT DETAILS Name: [name] [name] Age: 72 y.o. Sex: male Date of Birth: [birth year] Admit Date: 06/05/2018 Admitting Physician Albertine Patricia, MD [id number], Christean Grief, MD  Brief Narrative: Patient is a 72 y.o. male with history of CAD, hypertension, PAF not on anticoagulation-recent right hemicolectomy for a cecal mass-just discharged by the general surgical service on 1/24-[id number] course was complicated by development of obstructive uropathy and AKI ( discharged with a Foley catheter, successful outpatient voiding trial on 10/1)-presented to the hospital with sepsis secondary to Acinetobacter bacteremia, acute kidney injury.  See below for further details.  Subjective: Frustrated-as to why he needs to remain in the hospital-says he needs to leave-and wishes not to remain hospitalized any longer.  Claims god is day to take care of him   Assessment/Plan: Sepsis secondary to Acinetobacter bacteremia along with Acinetobacter and enterococcal UTI: Probably related to recent Foley cath placement-sepsis pathophysiology has resolved with IV antibiotics and IV fluids.  Blood cultures positive for Acinetobacter, urine cultures growing enterococcus and Acinetobacter.  Continue IV Unasyn-await sensitivity to see if Acinetobacter is sensitive to amoxicillin.  AKI on CKD stage III: Thought to have hemodynamically related AKI in the setting of sepsis and hypotension.  Due to concern for obstructive nephropathy/prostatomegaly-Foley catheter placed.  Approximately 4 L of urine output overnight-creatinine still significantly elevated and essentially unchanged since yesterday.  Once creatinine starts to downtrend further-suspect we can perform a voiding trial prior to discharge.  Nephrology following.    Hypokalemia: Repleted-follow periodically.  BPH with recent urinary retention/obstructive uropathy requiring Foley catheter placement: Foley catheter inserted on  10/5 due to worsening renal function concern for obstructive uropathy which could be contributing to AKI-once renal function improves further-voiding trial will be attempted in the next few days.  Continue Flomax and Proscar.  Anemia: Multifactorial-has anemia of chronic disease at baseline-worsened by acute illness and IV fluid dilution.  Hemoglobin stable-no indication to transfuse at this point-he is asymptomatic.    Chronic diastolic heart failure: Minimal leg edema-continue cautious IV fluid administration.  Hypertension: Blood pressure slowly creeping up-start amlodipine at 5 mg daily-if blood pressure still elevated-we will slowly increase back to usual dosing of 10 mg daily.   CAD: No anginal symptoms-follow.  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  DVT Prophylaxis: Prophylactic Heparin   Code Status: Full code  Family Communication: None at bedside if an attempted to call patient's son and daughter-unable to get in touch with him.  Disposition Plan: Remain inpatient-home later this week.  However concerned that patient may leave [facility] before he is medically ready for discharge.  I have explained the rationale to remain hospitalized-and the life-threatening and life disabling risk if he leaves before medically being stable.   Antimicrobial agents: Anti-infectives (From admission, onward)   Start     Dose/Rate Route Frequency Ordered Stop   06/08/18 1200  Ampicillin-Sulbactam (UNASYN) 3 g in sodium chloride 0.9 % 100 mL IVPB     3 g 200 mL/hr over 30 Minutes Intravenous Every 12 hours 06/08/18 1051     06/08/18 1100  Ampicillin-Sulbactam (UNASYN) 3 g in sodium chloride 0.9 % 100 mL IVPB  Status:  Discontinued     3 g 200 mL/hr over 30 Minutes Intravenous Every 24 hours 06/08/18 1049  06/08/18 1051   06/08/18 1000  meropenem (MERREM) 1 g in sodium chloride 0.9 % 100 mL  IVPB  Status:  Discontinued     1 g 200 mL/hr over 30 Minutes Intravenous Every 24 hours 06/07/18 1303 06/08/18 1026   06/06/18 1345  meropenem (MERREM) 1 g in sodium chloride 0.9 % 100 mL IVPB  Status:  Discontinued     1 g 200 mL/hr over 30 Minutes Intravenous Every 12 hours 06/06/18 1333 06/07/18 1303   06/06/18 0800  ceFEPIme (MAXIPIME) 1 g in sodium chloride 0.9 % 100 mL IVPB  Status:  Discontinued     1 g 200 mL/hr over 30 Minutes Intravenous Every 24 hours 06/05/18 1056 06/06/18 1333   06/05/18 0900  vancomycin (VANCOCIN) 2,000 mg in sodium chloride 0.9 % 500 mL IVPB     2,000 mg 250 mL/hr over 120 Minutes Intravenous  Once 06/05/18 0816 06/05/18 1149   06/05/18 0730  ceFEPIme (MAXIPIME) 2 g in sodium chloride 0.9 % 100 mL IVPB     2 g 200 mL/hr over 30 Minutes Intravenous  Once 06/05/18 0720 06/05/18 0833   06/05/18 0730  metroNIDAZOLE (FLAGYL) IVPB 500 mg  Status:  Discontinued     500 mg 100 mL/hr over 60 Minutes Intravenous Every 8 hours 06/05/18 0720 06/05/18 1035   06/05/18 0730  vancomycin (VANCOCIN) IVPB 1000 mg/200 mL premix  Status:  Discontinued     1,000 mg 200 mL/hr over 60 Minutes Intravenous  Once 06/05/18 0720 06/05/18 0816      Procedures: None  CONSULTS:  None  Time spent: 25 minutes-Greater than 50% of this time was spent in counseling, explanation of diagnosis, planning of further management, and coordination of care.  MEDICATIONS: Scheduled Meds: . finasteride  5 mg Oral Daily  . heparin  5,000 Units Subcutaneous Q8H  . insulin aspart  0-5 Units Subcutaneous QHS  . insulin aspart  0-9 Units Subcutaneous TID WC  . tamsulosin  0.4 mg Oral QHS   Continuous Infusions: . sodium chloride 10 mL/hr at 06/09/18 1039  . ampicillin-sulbactam (UNASYN) IV 3 g (06/09/18 1046)   PRN Meds:.acetaminophen **OR** acetaminophen, albuterol, HYDROcodone-acetaminophen, metoprolol tartrate, ondansetron **OR** ondansetron (ZOFRAN) IV, senna-docusate   PHYSICAL  EXAM: Vital signs: Vitals:   06/08/18 1630 06/08/18 2138 06/09/18 0448 06/09/18 0500  BP:  (!) 159/87 (!) 159/98   Pulse:  64 (!) 59   Resp:  18 18   Temp: 97.7 F (36.5 C) 98.7 F (37.1 C) 98.6 F (37 C)   TempSrc:  Oral Oral   SpO2:  98% 98%   Weight:    103.4 kg  Height:       Filed Weights   06/05/18 0256 06/05/18 1251 06/09/18 0500  Weight: 99.8 kg 99.8 kg 103.4 kg   Body mass index is 36.78 kg/m.   General appearance:Awake, alert, not in any distress.  Eyes:no scleral icterus. HEENT: Atraumatic and Normocephalic Neck: supple, no JVD. Resp:Good air entry bilaterally,no rales or rhonchi CVS: S1 S2 regular, no murmurs.  GI: Bowel sounds present, Non tender and not distended with no gaurding, rigidity or rebound. Extremities: B/L Lower Ext shows trace edema, both legs are warm to touch Neurology:  Non focal Psychiatric: Normal judgment and insight. Normal mood. Musculoskeletal:No digital cyanosis Skin:No Rash, warm and dry Wounds:N/A  I have personally reviewed following labs and imaging studies  LABORATORY DATA: CBC: Recent Labs  Lab 06/05/18 0258 06/06/18 0330 06/07/18 0412 06/08/18 0553 06/09/18 1019  WBC 13.4*  8.6 6.9 6.6 6.5  NEUTROABS 12.5*  --   --   --   --   HGB 8.3* 8.0* 7.1* 7.6* 7.2*  HCT 25.8* 24.9* 21.8* 24.1* 23.0*  MCV 98.1 98.4 98.6 99.2 100.4*  PLT 302 288 284 295 443    Basic Metabolic Panel: Recent Labs  Lab 06/05/18 0258 06/06/18 0330 06/07/18 0412 06/08/18 0553 06/09/18 1019  NA 135 137 139 136 139  K 3.2* 3.4* 3.8 3.5 3.6  CL 99 103 107 105 105  CO2 22 21* 22 22 20*  GLUCOSE 146* 103* 95 101* 140*  BUN 51* 56* 64* 66* 64*  CREATININE 3.72* 4.84* 5.99* 5.91* 5.91*  CALCIUM 8.8* 8.6* 8.5* 8.2* 8.5*  PHOS  --   --   --  4.1  --     GFR: Estimated Creatinine Clearance: 12.3 mL/min (A) (by C-G formula based on SCr of 5.91 mg/dL (H)).  Liver Function Tests: Recent Labs  Lab 06/05/18 0258 06/08/18 0553  AST 26  --    ALT 18  --   ALKPHOS 78  --   BILITOT 1.7*  --   PROT 6.8  --   ALBUMIN 3.2* 2.9*   No results for input(s): LIPASE, AMYLASE in the last 168 hours. No results for input(s): AMMONIA in the last 168 hours.  Coagulation Profile: No results for input(s): INR, PROTIME in the last 168 hours.  Cardiac Enzymes: No results for input(s): CKTOTAL, CKMB, CKMBINDEX, TROPONINI in the last 168 hours.  BNP (last 3 results) No results for input(s): PROBNP in the last 8760 hours.  HbA1C: No results for input(s): HGBA1C in the last 72 hours.  CBG: Recent Labs  Lab 06/08/18 0545 06/08/18 2137 06/09/18 0802 06/09/18 1134  GLUCAP 95 101* 88 116*    Lipid Profile: No results for input(s): CHOL, HDL, LDLCALC, TRIG, CHOLHDL, LDLDIRECT in the last 72 hours.  Thyroid Function Tests: No results for input(s): TSH, T4TOTAL, FREET4, T3FREE, THYROIDAB in the last 72 hours.  Anemia Panel: No results for input(s): VITAMINB12, FOLATE, FERRITIN, TIBC, IRON, RETICCTPCT in the last 72 hours.  Urine analysis:    Component Value Date/Time   COLORURINE STRAW (A) 06/07/2018 1643   APPEARANCEUR CLEAR 06/07/2018 1643   LABSPEC 1.008 06/07/2018 1643   PHURINE 6.0 06/07/2018 1643   GLUCOSEU NEGATIVE 06/07/2018 1643   HGBUR LARGE (A) 06/07/2018 1643   BILIRUBINUR NEGATIVE 06/07/2018 1643   KETONESUR NEGATIVE 06/07/2018 1643   PROTEINUR 30 (A) 06/07/2018 1643   NITRITE NEGATIVE 06/07/2018 1643   LEUKOCYTESUR MODERATE (A) 06/07/2018 1643    Sepsis Labs: Lactic Acid, Venous    Component Value Date/Time   LATICACIDVEN 1.63 06/05/2018 0310    MICROBIOLOGY: Recent Results (from the past 240 hour(s))  Blood culture (routine x 2)     Status: Abnormal (Preliminary result)   Collection Time: 06/05/18  2:52 AM  Result Value Ref Range Status   Specimen Description BLOOD LEFT HAND  Final   Special Requests   Final    BOTTLES DRAWN AEROBIC AND ANAEROBIC Blood Culture adequate volume   Culture  Setup Time    Final    IN BOTH AEROBIC AND ANAEROBIC BOTTLES GRAM NEGATIVE RODS CRITICAL RESULT CALLED TO, READ BACK BY AND VERIFIED WITH: L SEAY PHARMD 06/06/18 1225 JDW    Culture (A)  Final    ACINETOBACTER CALCOACETICUS/BAUMANNII COMPLEX Sent to [place] for further susceptibility testing. Performed at [facility], [place] [address] [place], [place] [address]  Report Status PENDING  Incomplete   Organism ID, Bacteria ACINETOBACTER CALCOACETICUS/BAUMANNII COMPLEX  Final      Susceptibility   Acinetobacter calcoaceticus/baumannii complex - MIC*    CEFTAZIDIME 8 SENSITIVE Sensitive     CEFTRIAXONE 16 INTERMEDIATE Intermediate     CIPROFLOXACIN 0.5 SENSITIVE Sensitive     GENTAMICIN 4 SENSITIVE Sensitive     IMIPENEM <=0.25 SENSITIVE Sensitive     PIP/TAZO <=4 SENSITIVE Sensitive     TRIMETH/SULFA <=20 SENSITIVE Sensitive     CEFEPIME 8 SENSITIVE Sensitive     AMPICILLIN/SULBACTAM <=2 SENSITIVE Sensitive     * ACINETOBACTER CALCOACETICUS/BAUMANNII COMPLEX  Blood Culture ID Panel (Reflexed)     Status: None   Collection Time: 06/05/18  2:52 AM  Result Value Ref Range Status   Enterococcus species NOT DETECTED NOT DETECTED Final    Comment: CRITICAL RESULT CALLED TO, READ BACK BY AND VERIFIED WITH: L SEAY PHARMD 06/06/18 1225 JDW    Listeria monocytogenes NOT DETECTED NOT DETECTED Final   Staphylococcus species NOT DETECTED NOT DETECTED Final   Staphylococcus aureus (BCID) NOT DETECTED NOT DETECTED Final   Streptococcus species NOT DETECTED NOT DETECTED Final   Streptococcus agalactiae NOT DETECTED NOT DETECTED Final   Streptococcus pneumoniae NOT DETECTED NOT DETECTED Final   Streptococcus pyogenes NOT DETECTED NOT DETECTED Final   Acinetobacter baumannii NOT DETECTED NOT DETECTED Final   Enterobacteriaceae species NOT DETECTED NOT DETECTED Final   Enterobacter cloacae complex NOT DETECTED NOT DETECTED Final   Escherichia coli NOT DETECTED NOT DETECTED Final   Klebsiella oxytoca NOT  DETECTED NOT DETECTED Final   Klebsiella pneumoniae NOT DETECTED NOT DETECTED Final   Proteus species NOT DETECTED NOT DETECTED Final   Serratia marcescens NOT DETECTED NOT DETECTED Final   Haemophilus influenzae NOT DETECTED NOT DETECTED Final   Neisseria meningitidis NOT DETECTED NOT DETECTED Final   Pseudomonas aeruginosa NOT DETECTED NOT DETECTED Final   Candida albicans NOT DETECTED NOT DETECTED Final   Candida glabrata NOT DETECTED NOT DETECTED Final   Candida krusei NOT DETECTED NOT DETECTED Final   Candida parapsilosis NOT DETECTED NOT DETECTED Final   Candida tropicalis NOT DETECTED NOT DETECTED Final    Comment: Performed at [facility], [place] [address], [place], [place] [address]  Blood culture (routine x 2)     Status: None (Preliminary result)   Collection Time: 06/05/18  3:00 AM  Result Value Ref Range Status   Specimen Description BLOOD RIGHT ANTECUBITAL  Final   Special Requests   Final    BOTTLES DRAWN AEROBIC AND ANAEROBIC Blood Culture adequate volume   Culture   Final    NO GROWTH 4 DAYS Performed at [facility], [place] [address] [place], [place] [address]    Report Status PENDING  Incomplete  Culture, Urine     Status: Abnormal   Collection Time: 06/05/18  8:00 AM  Result Value Ref Range Status   Specimen Description URINE, RANDOM  Final   Special Requests   Final    NONE Performed at [facility], [place] [address] [place], [place] [address]    Culture (A)  Final    >=100,000 COLONIES/mL ENTEROCOCCUS FAECALIS 10,000 COLONIES/mL ACINETOBACTER CALCOACETICUS/BAUMANNII COMPLEX    Report Status 06/08/2018 FINAL  Final   Organism ID, Bacteria ENTEROCOCCUS FAECALIS (A)  Final   Organism ID, Bacteria ACINETOBACTER CALCOACETICUS/BAUMANNII COMPLEX (A)  Final      Susceptibility   Acinetobacter calcoaceticus/baumannii complex - MIC*  CEFTAZIDIME 8 SENSITIVE Sensitive     CEFTRIAXONE 16 INTERMEDIATE Intermediate     CIPROFLOXACIN 1 SENSITIVE  Sensitive     GENTAMICIN 4 SENSITIVE Sensitive     IMIPENEM <=0.25 SENSITIVE Sensitive     PIP/TAZO >=128 RESISTANT Resistant     TRIMETH/SULFA <=20 SENSITIVE Sensitive     CEFEPIME 8 SENSITIVE Sensitive     AMPICILLIN/SULBACTAM <=2 SENSITIVE Sensitive     * 10,000 COLONIES/mL ACINETOBACTER CALCOACETICUS/BAUMANNII COMPLEX   Enterococcus faecalis - MIC*    AMPICILLIN <=2 SENSITIVE Sensitive     LEVOFLOXACIN 1 SENSITIVE Sensitive     NITROFURANTOIN <=16 SENSITIVE Sensitive     VANCOMYCIN 1 SENSITIVE Sensitive     * >=100,000 COLONIES/mL ENTEROCOCCUS FAECALIS  MRSA PCR Screening     Status: None   Collection Time: 06/05/18  1:04 PM  Result Value Ref Range Status   MRSA by PCR NEGATIVE NEGATIVE Final    Comment:        The GeneXpert MRSA Assay (FDA approved for NASAL specimens only), is one component of a comprehensive MRSA colonization surveillance program. It is not intended to diagnose MRSA infection nor to guide or monitor treatment for MRSA infections. Performed at [facility], [place] [address], [place], [place] [address]     RADIOLOGY STUDIES/RESULTS: Ct Abdomen Pelvis Wo Contrast  Result Date: 06/05/2018 CLINICAL DATA:  Abdominal pain, fever.  Recent partial colectomy. EXAM: CT ABDOMEN AND PELVIS WITHOUT CONTRAST TECHNIQUE: Multidetector CT imaging of the abdomen and pelvis was performed following the standard protocol without IV contrast. COMPARISON:  05/30/2018 FINDINGS: Lower chest: Cardiomegaly.  Bibasilar atelectasis.  No effusions. Hepatobiliary: Small scattered hepatic cysts, small scattered hepatic low-density lesions throughout the liver, likely cysts although these cannot be characterized fully without intravenous contrast. Gallbladder unremarkable. Pancreas: No focal abnormality or ductal dilatation. Spleen: No focal abnormality.  Normal size. Adrenals/Urinary Tract: Numerous low-density, mixed density and high-density masses throughout the kidney are stable  since recent study and cannot be characterized fully without intravenous contrast. At least 2 of these masses were shown on prior contrast enhanced CT to be suspicious for renal cell carcinoma. No hydronephrosis. Adrenal glands unremarkable. Urinary bladder decompressed and mildly thick walled, likely related to prostate enlargement. Stomach/Bowel: Descending colonic and sigmoid diverticulosis. Postoperative changes in the right colon from right hemicolectomy, stable. Again seen is the ventral hernia containing a portion of the mid transverse colon which appears unremarkable. No evidence of obstruction or bowel wall thickening. Stomach and small bowel grossly unremarkable. No evidence of bowel obstruction. Vascular/Lymphatic: Aortic atherosclerosis. No enlarged abdominal or pelvic lymph nodes. Reproductive: Marked enlargement of the prostate. Other: No free fluid or free air. Continued wall thickening within the periphery of the midline supraumbilical ventral hernia which also contains the transverse colon. Again, no underlying bowel wall thickening. This has a stable appearance since prior study. Musculoskeletal: No acute bony abnormality. Diffuse degenerative changes in the lumbar spine. IMPRESSION: Stable appearance of prior right hemicolectomy. Stable supraumbilical ventral hernia containing a portion of the transverse colon. The wall of the ventral hernia again appears thickened and indistinct, but underlying bowel does not appear abnormal. Scattered diverticulosis.  No active diverticulitis. Marked prostate enlargement with bladder wall thickening. Multiple renal lesions. At least 2 of which were shown on prior contrast-enhanced CT to be suspicious for renal cell carcinoma. No change. Scattered hepatic low-density lesions cannot be characterized without intravenous contrast, likely cysts. Cardiomegaly, bibasilar atelectasis. Aortic atherosclerosis. Electronically Signed   By: Rolm Baptise  M.D.   On: 06/05/2018  08:22   Ct Abdomen Pelvis Wo Contrast  Result Date: 05/30/2018 CLINICAL DATA:  Partial right colectomy, perform laparoscopically. EXAM: CT ABDOMEN AND PELVIS WITHOUT CONTRAST TECHNIQUE: Multidetector CT imaging of the abdomen and pelvis was performed following the standard protocol without IV contrast. COMPARISON:  March 24, 2018 FINDINGS: Lower chest: Cardiomegaly.  No other abnormalities. Hepatobiliary: Multiple low-attenuation lesions in the liver are most likely cysts, unchanged. The liver and gallbladder are otherwise unremarkable. Pancreas: Unremarkable. No pancreatic ductal dilatation or surrounding inflammatory changes. Spleen: Normal in size without focal abnormality. Adrenals/Urinary Tract: Adrenal glands are normal. A 4.1 cm apparently solid mass is seen in the upper pole of the left kidney measuring 3.9 cm previously. A 2.7 cm mass in the medial left kidney on series 2, image 28 is essentially unchanged and nonspecific. Several renal cysts are noted. There is a solid and cystic appearing mass in the lower pole of the right kidney difficult to compare to the previous study due to lack of contrast today. However, the maximum measurement is approximately 4.6 cm, unchanged. Several probable hyperdense cysts are noted. The bladder is decompressed with a Foley catheter. There is mild fat stranding adjacent to the bladder which is a new finding. No renal stones or hydronephrosis. No ureteral stones. Stomach/Bowel: The stomach and small bowel are normal. The patient is status post partial right colectomy scattered colonic diverticuli are noted. There is a new ventral hernia, located superior to the previously identified umbilical hernia. This is best seen on series 2, image 35. There is fat stranding within the hernia which likely contains omentum. It also contains a portion of transverse colon which does not appear to be inflamed. The wall of the hernia is thickened and inflamed. Surgical changes are seen in  the right lower abdomen at the site of right colectomy. There is some increased attenuation in the surrounding fat, likely postoperative. No collection to suggest abscess or leak. The appendix is been removed along with the right partial colectomy. Vascular/Lymphatic: Mild atherosclerotic changes in the aorta. No obvious adenopathy. Reproductive: Again noted is an enlarged prostate exerting mass effect on the posterior bladder. Other: No abdominal wall hernia or abnormality. No abdominopelvic ascites. Musculoskeletal: No acute or significant osseous findings. IMPRESSION: 1. At least 2 renal masses concerning for renal cell carcinoma. The masses were better assessed with a previous contrast-enhanced study. Recommend an MRI when the patient is able. 2. There is a new ventral hernia. The periphery of the hernia/hernia wall is thickened and inflamed. The hernia contains transverse colon which does not appear to be inflamed or obstructed. There is also likely some herniated omentum with associated fat stranding. This hernia is new compared to March 24, 2018. No associated bowel obstruction. 3. Inflammatory changes in the right lower quadrant, surrounding the site of right partial colectomy. These findings are likely postsurgical as surgery was only just over 1 week ago. No evidence of leak or abscess. 4. Atherosclerotic changes in the abdominal aorta. Electronically Signed   By: Dorise Bullion III M.D   On: 05/30/2018 12:24   Dg Chest 2 View  Result Date: 06/05/2018 CLINICAL DATA:  Shortness of breath. EXAM: CHEST - 2 VIEW COMPARISON:  Chest CT 05/19/2018 FINDINGS: Chronic cardiomegaly and aortic tortuosity. No pulmonary edema, pleural effusion, focal airspace disease or pneumothorax. No acute osseous abnormalities. IMPRESSION: Cardiomegaly without congestive failure or acute chest finding. Electronically Signed   By: Keith Rake M.D.   On: 06/05/2018 03:21  US Renal  Result Date: 06/06/2018 CLINICAL DATA:   Acute kidney injury, history coronary artery disease, chronic diastolic CHF, COPD, type II diabetes mellitus, hypertension, hypertensive heart disease, prostate cancer, renal cell carcinoma EXAM: RENAL / URINARY TRACT ULTRASOUND COMPLETE COMPARISON:  CT abdomen and pelvis 06/05/2017 FINDINGS: Right Kidney: Length: 14.5 cm. Normal cortical thickness. Upper normal echogenicity. Cystic lesion at mid kidney, 2.7 x 2.2 x 3.0 cm, containing a few scattered low level internal echoes; this lesion is hyperdense on the prior CT exam. An additional nodule at the inferior pole measures 3.9 x 2.0 x 2.7 cm and demonstrates multiple septations and low-level scattered internal echogenicity. This lesion is mildly complicated and demonstrated low-attenuation and high attenuation components on the prior CT. No hydronephrosis or shadowing calcification. Left Kidney: Length: 16.7 cm. Normal cortical thickness. Minimally increased cortical echogenicity. Small simple cyst at inferior pole, 3.9 x 2.0 x 2.7 cm. Additional complicated cyst at upper pole 2.5 x 2.1 x 2.9 cm with a scattered internal echoes. Additional mass at upper pole appear solid, 4.9 x 4.5 x 4.5 cm, highly suspicious for a renal neoplasm. No hydronephrosis or shadowing calcification. Bladder: Normal appearing bladder. Marked prostatic enlargement, 9.7 x 4.5 x 6.8 cm IMPRESSION: Probable medical renal disease changes of the kidneys. BILATERAL simple renal cysts. Additional solid mass at upper pole LEFT kidney 4.9 cm greatest size most likely representing a solid renal neoplasm, demonstrated enhancement on a prior CT exam of 03/24/2018 and now demonstrates an interval increase in size. Additional complicated cystic lesions at the inferior pole of RIGHT kidney and upper pole LEFT kidney, indeterminate, cannot exclude renal neoplasms; consider MR characterization. Marked prostatic enlargement. Electronically Signed   By: Lavonia Dana M.D.   On: 06/06/2018 17:55   Ct Angio  Chest Aorta W &/or Wo Contrast  Result Date: 05/19/2018 CLINICAL DATA:  Yearly follow up thoracic aortic aneurysm. Hx of recent syncopal episodes in [place] and post prandial. EXAM: CT ANGIOGRAPHY CHEST WITH CONTRAST TECHNIQUE: Multidetector CT imaging of the chest was performed using the standard protocol during bolus administration of intravenous contrast. Multiplanar CT image reconstructions and MIPs were obtained to evaluate the vascular anatomy. CONTRAST:  116mL ISOVUE-370 IOPAMIDOL (ISOVUE-370) INJECTION 76% COMPARISON:  05/30/2017 and previous FINDINGS: Cardiovascular: Cardiomegaly. No pericardial effusion. Dilated central pulmonary arteries. Fair contrast opacification of pulmonary artery branches; the exam was not optimized for detection of pulmonary emboli. Mild scattered coronary calcifications. Good contrast opacification of the thoracic aorta without dissection or stenosis. Transverse diameters as follows: 5.2 cm sinuses of Valsalva 4.2 cm  sino-tubular junction 4.6 cm mid ascending (previously 4.3 cm) 4.3 cm distal ascending/proximal arch 3.4 cm distal arch 3.2 cm proximal descending 2.8 cm distal descending Bovine variant brachiocephalic arterial origin anatomy without proximal stenosis. Visualized proximal abdominal aorta unremarkable. Ectatic distal celiac axis up to 1.5 cm diameter. Mediastinum/Nodes: No hilar or mediastinal adenopathy. Lungs/Pleura: No pleural effusion.  No pneumothorax.  Lungs clear. Upper Abdomen: Nodular liver contour. Stable hepatic cysts. Exophytic 5.8 cm upper pole left renal lesion measuring above fluid attenuation. No acute findings. Musculoskeletal: 5 mm subcutaneous metallic density in the left anterior chest wall subcutaneous tissues. Mid and lower thoracic spondylitic change. No fracture or worrisome bone lesion. Review of the MIP images confirms the above findings. IMPRESSION: 1. 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 Recommend semi-annual imaging followup by CTA or MRA and referral to cardiothoracic surgery if not  already obtained. This recommendation follows 2010 ACCF/AHA/AATS/ACR/ASA/SCA/SCAI/SIR/STS/SVM Guidelines for the Diagnosis and Management of Patients With Thoracic Aortic Disease. Circulation. 2010; 121: J449-E010 2. 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 Recommend dedicated renal protocol CT or MR with contrast. 3. Coronary calcifications. The severity of coronary artery disease and any potential stenosis cannot be assessed on this non-gated CT examination. Assessment for potential risk factor modification, dietary therapy or pharmacologic therapy may be warranted, if clinically indicated. 4. Nodular liver contour suggesting cirrhosis. Electronically Signed   By: Lucrezia Europe M.D.   On: 05/19/2018 13:45     LOS: 4 days   Oren Binet, MD  [facility]  If 7PM-7AM, please contact night-coverage  Please page via [email or web address]-click on MD name and type text message  06/09/2018, 12:38 PM

## 2018-06-09 NOTE — Progress Notes (Signed)
Physical Therapy Treatment Patient Details Name: [name] [name] MRN: [id number] DOB: [birth year] Today's Date: 06/09/2018    History of Present Illness  [name] [name]  is a 72 y.o. male, past medical history of CAD, hypertension, hyperlipidemia, A. 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 Pt found to have soft BP, tachycardia, UTI, and worsening renal function in ED.     PT Comments    Pt performed transfers and gait training with cues for posture.  He progresses with good speed.  Pt with complaints of catheter burning.  Informed RN of complaints.  Noticeable purulent drainage from catheter entry.  Continued to recommend HHPT next session to improve strength and function.  Plan next session for high level standing exercises to challenge balance.      Follow Up Recommendations  Home health PT;Supervision for mobility/OOB     Equipment Recommendations  Rolling walker with 5" wheels    Recommendations for Other Services       Precautions / Restrictions Precautions Precautions: Fall Precaution Comments: pt has been having some dizziness Restrictions Weight Bearing Restrictions: No    Mobility  Bed Mobility               General bed mobility comments: Pt in chair on arrival, complaining of catheter pain.    Transfers Overall transfer level: Needs assistance Equipment used: None Transfers: Sit to/from Stand Sit to Stand: Supervision         General transfer comment: Pt performed with cues for hand placement to and from seated surface.  No complaints of dizziness.     Ambulation/Gait Ambulation/Gait assistance: Supervision Gait Distance (Feet): 200 Feet Assistive device: IV Pole Gait Pattern/deviations: Step-through pattern;Wide base of support;Drifts right/left;Trunk flexed Gait velocity: decreased   General Gait Details: Cues for upper trunk control.  3 standing breaks.     Stairs             Wheelchair Mobility    Modified Rankin (Stroke Patients Only)       Balance Overall balance assessment: Needs assistance   Sitting balance-Leahy Scale: Good       Standing balance-Leahy Scale: Poor                              Cognition Arousal/Alertness: Awake/alert Behavior During Therapy: WFL for tasks assessed/performed Overall Cognitive Status: Within Functional Limits for tasks assessed                                        Exercises      General Comments        Pertinent Vitals/Pain Pain Assessment: No/denies pain    Home Living                      Prior Function            PT Goals (current goals can now be found in the care plan section) Acute Rehab PT Goals Patient Stated Goal: feel better Potential to Achieve Goals: Good Progress towards PT goals: Progressing toward  goals    Frequency    Min 3X/week      PT Plan Current plan remains appropriate    Co-evaluation              AM-PAC PT "6 Clicks" Daily Activity  Outcome Measure  Difficulty turning over in bed (including adjusting bedclothes, sheets and blankets)?: None Difficulty moving from lying on back to sitting on the side of the bed? : None Difficulty sitting down on and standing up from a chair with arms (e.g., wheelchair, bedside commode, etc,.)?: A Little Help needed moving to and from a bed to chair (including a wheelchair)?: A Little Help needed walking in hospital room?: A Little Help needed climbing 3-5 steps with a railing? : A Little 6 Click Score: 20    End of Session Equipment  Utilized During Treatment: Gait belt Activity Tolerance: Patient tolerated treatment well Patient left: in chair;with call bell/phone within reach Nurse Communication: Mobility status PT Visit Diagnosis: Dizziness and giddiness (R42);Other abnormalities of gait and mobility (R26.89)     Time: 1237-1300 PT Time Calculation (min) (ACUTE ONLY): 23 min  Charges:  $Gait Training: 8-22 mins $Therapeutic Activity: 8-22 mins                     [name] [name], PTA Acute Rehabilitation Services Pager [phone] Office [phone]     [name] [name] 06/09/2018, 5:28 PM

## 2018-06-09 NOTE — Progress Notes (Signed)
Patient complaining of drainage around foley, assessed patient appears patient has been messing with foley the cath was pushed into penis all the way to the Y and stat lock was at tip of penis. Patient denies messing with foley .MD made aware patient requesting foley be removed. MD came to the floor and talked to patient. Patient agreed to keep foley after MD talked with him.  Fayrene Towner, Tivis Ringer, RN

## 2018-06-09 NOTE — Plan of Care (Signed)
  Problem: Health Behavior/Discharge Planning: Goal: Ability to manage health-related needs will improve Outcome: Not Progressing   Problem: Nutrition: Goal: Adequate nutrition will be maintained Outcome: Progressing   Problem: Elimination: Goal: Will not experience complications related to urinary retention Outcome: Progressing   Problem: Safety: Goal: Ability to remain free from injury will improve Outcome: Progressing   Problem: Skin Integrity: Goal: Risk for impaired skin integrity will decrease Outcome: Progressing

## 2018-06-09 NOTE — Progress Notes (Signed)
Patient called RN into room stating that he "has gotten too much fluid and it is making him jump like his wife did." He also stated that "I don't care what the doctor says, I am getting bloated and it is making me jump." Pt educated. RN capped fluids at that time.

## 2018-06-09 NOTE — Care Management Note (Signed)
Case Management Note  Patient Details  Name: [name] [name] MRN: [id number] Date of Birth: March 25, 1946  Subjective/Objective:   Acute on CRF, Sepsis/ Urine and blood cx's + for acinetobacter. Hx of COPD, HTN, CAD, CKD III, PAF, BPH.From home with family. States PTA received PCS from [place].  [address] Pandora Leiter) [name] [name] (Daughter)    [phone]     PCP: Dr. Nolene Ebbs  Action/Plan: Transition to home when medically stable.Marland KitchenMarland KitchenNCM following for transitions of care needs.  Per PT's evaluation: Home health PT;Supervision for mobility/OOB. Pt agreeable to home health services...choice provided per NCM. Referral made with [facility] after selected per pt.   Expected Discharge Date:                  Expected Discharge Plan:  [place]  In-House Referral:     Discharge planning Services  CM Consult  Post Acute Care Choice:    Choice offered to:  Patient  DME Arranged:  N/A DME Agency:  NA  HH Arranged:  RN, PT HH Agency:  [facility]  Status of Service:  In process  If discussed at Long Length of Stay Meetings, dates discussed:    Additional Comments:  [name], RN 06/09/2018, 11:29 AM

## 2018-06-09 NOTE — Progress Notes (Signed)
Patient called Rn into room with complaint of "being wet." Rn noticed the his gown and sheets were wet- catheter fluid was drained and 10cc was placed back in. Bedding changed at this time.

## 2018-06-10 ENCOUNTER — Inpatient Hospital Stay (HOSPITAL_COMMUNITY): Payer: Medicare Other

## 2018-06-10 DIAGNOSIS — C9000 Multiple myeloma not having achieved remission: Secondary | ICD-10-CM

## 2018-06-10 DIAGNOSIS — F419 Anxiety disorder, unspecified: Secondary | ICD-10-CM

## 2018-06-10 DIAGNOSIS — Z87891 Personal history of nicotine dependence: Secondary | ICD-10-CM

## 2018-06-10 DIAGNOSIS — R000 Tachycardia, unspecified: Secondary | ICD-10-CM

## 2018-06-10 DIAGNOSIS — E785 Hyperlipidemia, unspecified: Secondary | ICD-10-CM

## 2018-06-10 DIAGNOSIS — I351 Nonrheumatic aortic (valve) insufficiency: Secondary | ICD-10-CM

## 2018-06-10 DIAGNOSIS — I5032 Chronic diastolic (congestive) heart failure: Secondary | ICD-10-CM

## 2018-06-10 DIAGNOSIS — D649 Anemia, unspecified: Secondary | ICD-10-CM

## 2018-06-10 DIAGNOSIS — M542 Cervicalgia: Secondary | ICD-10-CM

## 2018-06-10 DIAGNOSIS — J449 Chronic obstructive pulmonary disease, unspecified: Secondary | ICD-10-CM

## 2018-06-10 DIAGNOSIS — Z8546 Personal history of malignant neoplasm of prostate: Secondary | ICD-10-CM

## 2018-06-10 DIAGNOSIS — E119 Type 2 diabetes mellitus without complications: Secondary | ICD-10-CM

## 2018-06-10 DIAGNOSIS — R748 Abnormal levels of other serum enzymes: Secondary | ICD-10-CM

## 2018-06-10 DIAGNOSIS — I2510 Atherosclerotic heart disease of native coronary artery without angina pectoris: Secondary | ICD-10-CM

## 2018-06-10 DIAGNOSIS — Z85528 Personal history of other malignant neoplasm of kidney: Secondary | ICD-10-CM

## 2018-06-10 DIAGNOSIS — I10 Essential (primary) hypertension: Secondary | ICD-10-CM

## 2018-06-10 DIAGNOSIS — I480 Paroxysmal atrial fibrillation: Secondary | ICD-10-CM

## 2018-06-10 LAB — CBC
HCT: 21.8 % — ABNORMAL LOW (ref 39.0–52.0)
Hemoglobin: 6.8 g/dL — CL (ref 13.0–17.0)
MCH: 30.8 pg (ref 26.0–34.0)
MCHC: 31.2 g/dL (ref 30.0–36.0)
MCV: 98.6 fL (ref 80.0–100.0)
NRBC: 0 % (ref 0.0–0.2)
PLATELETS: 341 10*3/uL (ref 150–400)
RBC: 2.21 MIL/uL — AB (ref 4.22–5.81)
RDW: 12.8 % (ref 11.5–15.5)
WBC: 7.5 10*3/uL (ref 4.0–10.5)

## 2018-06-10 LAB — RENAL FUNCTION PANEL
ALBUMIN: 2.8 g/dL — AB (ref 3.5–5.0)
Anion gap: 11 (ref 5–15)
BUN: 63 mg/dL — AB (ref 8–23)
CALCIUM: 8.2 mg/dL — AB (ref 8.9–10.3)
CHLORIDE: 105 mmol/L (ref 98–111)
CO2: 22 mmol/L (ref 22–32)
CREATININE: 5.77 mg/dL — AB (ref 0.61–1.24)
GFR, EST AFRICAN AMERICAN: 10 mL/min — AB (ref >60)
GFR, EST NON AFRICAN AMERICAN: 9 mL/min — AB (ref >60)
Glucose, Bld: 93 mg/dL (ref 70–99)
PHOSPHORUS: 5 mg/dL — AB (ref 2.5–4.6)
Potassium: 3.6 mmol/L (ref 3.5–5.1)
SODIUM: 138 mmol/L (ref 135–145)

## 2018-06-10 LAB — ABO/RH: ABO/RH(D): A POS

## 2018-06-10 LAB — HEMOGLOBIN AND HEMATOCRIT, BLOOD
HEMATOCRIT: 26 % — AB (ref 39.0–52.0)
Hemoglobin: 8.1 g/dL — ABNORMAL LOW (ref 13.0–17.0)

## 2018-06-10 LAB — PREPARE RBC (CROSSMATCH)

## 2018-06-10 LAB — GLUCOSE, CAPILLARY: Glucose-Capillary: 87 mg/dL (ref 70–99)

## 2018-06-10 LAB — CULTURE, BLOOD (ROUTINE X 2)
CULTURE: NO GROWTH
SPECIAL REQUESTS: ADEQUATE

## 2018-06-10 MED ORDER — SODIUM CHLORIDE 0.9% IV SOLUTION: Freq: Once | INTRAVENOUS | Status: DC

## 2018-06-10 NOTE — Progress Notes (Signed)
Pt refusing IVF, states he is getting bloated and he is going home today. Will continue to monitor pt. Ranelle Oyster, RN

## 2018-06-10 NOTE — Progress Notes (Signed)
HEMATOLOGY/ONCOLOGY CONSULTATION NOTE  Date of Service: 06/10/2018  Patient Care Team: Nolene Ebbs, MD as PCP - General (Internal Medicine) Minus Breeding, MD as PCP - Cardiology (Cardiology)  CHIEF COMPLAINTS/PURPOSE OF CONSULTATION:  likelky newly diagnosed Light chain myeloma   HISTORY OF PRESENTING ILLNESS:   [name] [name] is a wonderful 72 y.o. male who has been referred to [place] by Dr. 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 The pt reports that he is doing well overall.   Prior to our visit, the pt had a segmental colon resection for invasive adenocarcinoma on 05/21/18. The pt then developed BPH with urinary obstruction, and presented to the ED for fever, diarrhea, and was evaluated to have sepsis. As part of his elevated creatinine work up, he was seen to have concerning serum free light chain elevation, as noted below.   The pt reports that he is moving a lot of urine and has a catheter. He denies losing weight unexpectedly recently. He notes that he is eating well but is feeling constipated.   The pt denies any back pains or new/different headaches. The pt notes that he has been anemic before, most recently 3-4 months ago and was sent to a nephrologist but wasn't able to establish care due to challenge with distance.   The pt denies any fevers or chills. He received a unit of blood today as well.   The pt notes that he lives with a granddaughter and functions independently.   Most recent lab results (06/10/18) of CBC is as follows: all values are WNL except for RBC at 2.21, HGB at 6.8, HCT at 21.8. 06/07/18 SFLC revealed Kappa at 7790.4, Lambda at 34.3, and K:L ratio at 227.13  On review of systems, pt reports some constipation, improved energy levels, improved breathing, urinating, and denies bone pains, back pains, leg swelling, abdominal pains, and any other symptoms.    MEDICAL HISTORY:  Past Medical History:  Diagnosis Date  . Abnormal LFTs  (liver function tests)   . Anxiety   . Aortic root dilatation ([place])    a. 12/2015 Echo: mod dil of sinus of valsalva and Ao root (59mm).  b. 53 mm Echo 2018  . BPH with urinary obstruction   . Bradycardia   . 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 CAD (coronary artery disease)    a. 12/2015 NSTEMI/Cath (in setting of PAF):  LM nl, LAD 30p,  LCX 32m, RCA  ok, AM 100%, RPDA1 40, RPDA 2 60 ->Med Rx.  . Cervical pain 12/09/2014  . Childhood asthma   . Chronic diastolic CHF (congestive heart failure) ([place])    a. 12/2015 Echo: EF 50-55%, mod AI, mod Ao root dil, mild MR, mod dil LA, mod RA.  [name] COPD (chronic obstructive pulmonary disease) ([place])    pt denies at preop  . Diabetes mellitus type II    diet controlled  . History of syncope   . Hyperlipidemia   . Hypertensive heart disease   . Kidney lump 04/04/2010   Overview:  Renal Cell Carcinoma   . Moderate aortic insufficiency    a. 12/2015 Echo: Mod AI.  [name] Overactive bladder   . Paroxysmal atrial fibrillation ([place])    a. 12/2015 started on Xarelto (CHA2DS2VASc = 4-5).  . Pneumonia   . Prostate cancer ([place])   . Renal cell carcinoma ([place])   . Renal lesion   .  Sleep apnea    Does not like  CPAP  . Spinal stenosis in cervical region 10/12/2016    SURGICAL HISTORY: Past Surgical History:  Procedure Laterality Date  . BACK SURGERY    . CARDIAC CATHETERIZATION N/A 12/18/2015   Procedure: Left Heart Cath and Coronary Angiography;  Surgeon: Leonie Man, MD;  Location: [facility];  Service: Cardiovascular;  Laterality: N/A;  . DIAGNOSTIC LAPAROSCOPY     partial colectomy  . LAPAROSCOPIC PARTIAL COLECTOMY Right 05/21/2018   Procedure: LAPAROSCOPIC RIGHT  COLECTOMY ERAS PATHWAY;  Surgeon: Leighton Ruff, MD;  Location: [facility];  Service: General;  Laterality: Right;  . [place]   "replaced a disc"    SOCIAL HISTORY: Social History    Socioeconomic History  . Marital status: Widowed    Spouse name: Not on file  . Number of children: Not on file  . Years of education: Not on file  . Highest education level: Not on file  Occupational History  . Not on file  Social Needs  . Financial resource strain: Not on file  . Food insecurity:    Worry: Not on file    Inability: Not on file  . Transportation needs:    Medical: Not on file    Non-medical: Not on file  Tobacco Use  . Smoking status: Former Smoker    Types: Cigars    Last attempt to quit: 09/02/1977    Years since quitting: 40.7  . Smokeless tobacco: Never Used  . Tobacco comment:    Substance and Sexual Activity  . Alcohol use: Not Currently    Comment: "stopped drinking alcohol in ~ 1980; just drank a little on the weekends when I did drink"  . Drug use: No  . Sexual activity: Not Currently  Lifestyle  . Physical activity:    Days per week: Not on file    Minutes per session: Not on file  . Stress: Not on file  Relationships  . Social connections:    Talks on phone: Not on file    Gets together: Not on file    Attends religious service: Not on file    Active member of club or organization: Not on file    Attends meetings of clubs or organizations: Not on file    Relationship status: Not on file  . Intimate partner violence:    Fear of current or ex partner: Not on file    Emotionally abused: Not on file    Physically abused: Not on file    Forced sexual activity: Not on file  Other Topics Concern  . Not on file  Social History Narrative   Lives alone.  Wife died in 04/06/2023.  Lives in apartment.      FAMILY HISTORY: Family History  Problem Relation Age of Onset  . Emphysema Father   . Asthma Father   . Liver disease Father        tumor  . Heart disease Mother   . Hypertension Mother   . Asthma Sister   . Hypertension Son     ALLERGIES:  is allergic to amoxicillin and penicillins.  MEDICATIONS:  Current Facility-Administered  Medications  Medication Dose Route Frequency Provider Last Rate Last Dose  . 0.9 %  sodium chloride infusion (Manually program via Guardrails IV Fluids)   Intravenous Once Schorr, Rhetta Mura, NP      . 0.9 %  sodium chloride infusion   Intravenous Continuous Ghimire, Shanker M,  MD   Stopped at 06/10/18 0420  . acetaminophen (TYLENOL) tablet 650 mg  650 mg Oral Q6H PRN Elgergawy, Silver Huguenin, MD   650 mg at 06/07/18 2157   Or  . acetaminophen (TYLENOL) suppository 650 mg  650 mg Rectal Q6H PRN Elgergawy, Silver Huguenin, MD      . albuterol (PROVENTIL) (2.5 MG/3ML) 0.083% nebulizer solution 2.5 mg  2.5 mg Nebulization Q2H PRN Elgergawy, Silver Huguenin, MD      . amLODipine (NORVASC) tablet 5 mg  5 mg Oral Daily Jonetta Osgood, MD   5 mg at 06/10/18 0815  . Ampicillin-Sulbactam (UNASYN) 3 g in sodium chloride 0.9 % 100 mL IVPB  3 g Intravenous Q12H Karren Cobble, RPH 200 mL/hr at 06/09/18 2347 3 g at 06/09/18 2347  . finasteride (PROSCAR) tablet 5 mg  5 mg Oral Daily Jonetta Osgood, MD   5 mg at 06/10/18 0815  . heparin injection 5,000 Units  5,000 Units Subcutaneous Q8H Elgergawy, Silver Huguenin, MD   5,000 Units at 06/10/18 0548  . HYDROcodone-acetaminophen (NORCO/VICODIN) 5-325 MG per tablet 1-2 tablet  1-2 tablet Oral Q4H PRN Elgergawy, Silver Huguenin, MD      . metoprolol tartrate (LOPRESSOR) injection 5 mg  5 mg Intravenous Q5 min PRN Lovey Newcomer T, NP   5 mg at 06/06/18 0355  . ondansetron (ZOFRAN) tablet 4 mg  4 mg Oral Q6H PRN Elgergawy, Silver Huguenin, MD       Or  . ondansetron (ZOFRAN) injection 4 mg  4 mg Intravenous Q6H PRN Elgergawy, Silver Huguenin, MD      . senna-docusate (Senokot-S) tablet 2 tablet  2 tablet Oral BID PRN Elgergawy, Silver Huguenin, MD      . tamsulosin (FLOMAX) capsule 0.4 mg  0.4 mg Oral QHS Elgergawy, Silver Huguenin, MD   0.4 mg at 06/09/18 2130    REVIEW OF SYSTEMS:    10 Point review of Systems was done is negative except as noted above.  PHYSICAL EXAMINATION: ECOG PERFORMANCE STATUS:  2-3  . Vitals:   06/10/18 1204 06/10/18 1352  BP: (!) 138/93 (!) 141/67  Pulse: 61 65  Resp: 12 12  Temp: 98 F (36.7 C) 98 F (36.7 C)  SpO2: 100% 100%   Filed Weights   06/05/18 1251 06/09/18 0500 06/10/18 0506  Weight: 220 lb (99.8 kg) 227 lb 14.4 oz (103.4 kg) 225 lb 6.4 oz (102.2 kg)   .Body mass index is 36.38 kg/m.  GENERAL:alert, fatigued appearing. SKIN: no acute rashes, no significant lesions EYES: conjunctival pallor + non-injected, sclera anicteric OROPHARYNX: MMM, NECK: supple, no JVD LYMPH:  no palpable lymphadenopathy in the cervical, axillary or inguinal regions LUNGS: clear to auscultation b/l with normal respiratory effort HEART: irreg ABDOMEN:  normoactive bowel sounds , non tender, not distended. Extremity: 1+ pedal edema PSYCH: alert & oriented x 3 with fluent speech NEURO: no focal motor/sensory deficits  LABORATORY DATA:  I have reviewed the data as listed  . CBC Latest Ref Rng & Units 06/10/2018 06/09/2018 06/08/2018  WBC 4.0 - 10.5 K/uL 7.5 6.5 6.6  Hemoglobin 13.0 - 17.0 g/dL 6.8(LL) 7.2(L) 7.6(L)  Hematocrit 39.0 - 52.0 % 21.8(L) 23.0(L) 24.1(L)  Platelets 150 - 400 K/uL 341 267 295    . CMP Latest Ref Rng & Units 06/10/2018 06/09/2018 06/08/2018  Glucose 70 - 99 mg/dL 93 140(H) 101(H)  BUN 8 - 23 mg/dL 63(H) 64(H) 66(H)  Creatinine 0.61 - 1.24 mg/dL 5.77(H) 5.91(H) 5.91(H)  Sodium  135 - 145 mmol/L 138 139 136  Potassium 3.5 - 5.1 mmol/L 3.6 3.6 3.5  Chloride 98 - 111 mmol/L 105 105 105  CO2 22 - 32 mmol/L 22 20(L) 22  Calcium 8.9 - 10.3 mg/dL 8.2(L) 8.5(L) 8.2(L)  Total Protein 6.5 - 8.1 g/dL - - -  Total Bilirubin 0.3 - 1.2 mg/dL - - -  Alkaline Phos 38 - 126 U/L - - -  AST 15 - 41 U/L - - -  ALT 0 - 44 U/L - - -   Component     Latest Ref Rng & Units 06/07/2018  Total Protein ELP     6.0 - 8.5 g/dL 6.4  Albumin ELP     2.9 - 4.4 g/dL 2.8 (L)  Alpha-1-Globulin     0.0 - 0.4 g/dL 0.4  Alpha-2-Globulin     0.4 - 1.0 g/dL 1.1 (H)   Beta Globulin     0.7 - 1.3 g/dL 0.8  Gamma Globulin     0.4 - 1.8 g/dL 1.3  M-SPIKE, %     Not Observed g/dL Not Observed  SPE Interp.      Comment  Comment      Comment  Globulin, Total     2.2 - 3.9 g/dL 3.6  A/G Ratio     0.7 - 1.7 0.8  Hepatitis B Surface Ag     Negative Negative  HCV Ab     0.0 - 0.9 s/co ratio <0.1  Hep A Ab, IgM     Negative Negative  Hep B Core Ab, IgM     Negative Negative  Kappa free light chain     3.3 - 19.4 mg/L 7,790.4 (H)  Lamda free light chains     5.7 - 26.3 mg/L 34.3 (H)  Kappa, lamda light chain ratio     0.26 - 1.65 227.13 (H)  Myeloperoxidase Abs     0.0 - 9.0 U/mL <9.0  ANCA Proteinase 3     0.0 - 3.5 U/mL 7.2 (H)  ANA Ab, IFA      Negative  C3 Complement     82 - 167 mg/dL 144  Complement C4, Body Fluid     14 - 44 mg/dL 29    05/21/18 Colon biopsy:    RADIOGRAPHIC STUDIES: I have personally reviewed the radiological images as listed and agreed with the findings in the report. Ct Abdomen Pelvis Wo Contrast  Result Date: 06/05/2018 CLINICAL DATA:  Abdominal pain, fever.  Recent partial colectomy. EXAM: CT ABDOMEN AND PELVIS WITHOUT CONTRAST TECHNIQUE: Multidetector CT imaging of the abdomen and pelvis was performed following the standard protocol without IV contrast. COMPARISON:  05/30/2018 FINDINGS: Lower chest: Cardiomegaly.  Bibasilar atelectasis.  No effusions. Hepatobiliary: Small scattered hepatic cysts, small scattered hepatic low-density lesions throughout the liver, likely cysts although these cannot be characterized fully without intravenous contrast. Gallbladder unremarkable. Pancreas: No focal abnormality or ductal dilatation. Spleen: No focal abnormality.  Normal size. Adrenals/Urinary Tract: Numerous low-density, mixed density and high-density masses throughout the kidney are stable since recent study and cannot be characterized fully without intravenous contrast. At least 2 of these masses were shown on prior  contrast enhanced CT to be suspicious for renal cell carcinoma. No hydronephrosis. Adrenal glands unremarkable. Urinary bladder decompressed and mildly thick walled, likely related to prostate enlargement. Stomach/Bowel: Descending colonic and sigmoid diverticulosis. Postoperative changes in the right colon from right hemicolectomy, stable. Again seen is the ventral hernia containing a portion of the mid  transverse colon which appears unremarkable. No evidence of obstruction or bowel wall thickening. Stomach and small bowel grossly unremarkable. No evidence of bowel obstruction. Vascular/Lymphatic: Aortic atherosclerosis. No enlarged abdominal or pelvic lymph nodes. Reproductive: Marked enlargement of the prostate. Other: No free fluid or free air. Continued wall thickening within the periphery of the midline supraumbilical ventral hernia which also contains the transverse colon. Again, no underlying bowel wall thickening. This has a stable appearance since prior study. Musculoskeletal: No acute bony abnormality. Diffuse degenerative changes in the lumbar spine. IMPRESSION: Stable appearance of prior right hemicolectomy. Stable supraumbilical ventral hernia containing a portion of the transverse colon. The wall of the ventral hernia again appears thickened and indistinct, but underlying bowel does not appear abnormal. Scattered diverticulosis.  No active diverticulitis. Marked prostate enlargement with bladder wall thickening. Multiple renal lesions. At least 2 of which were shown on prior contrast-enhanced CT to be suspicious for renal cell carcinoma. No change. Scattered hepatic low-density lesions cannot be characterized without intravenous contrast, likely cysts. Cardiomegaly, bibasilar atelectasis. Aortic atherosclerosis. Electronically Signed   By: Rolm Baptise M.D.   On: 06/05/2018 08:22   Ct Abdomen Pelvis Wo Contrast  Result Date: 05/30/2018 CLINICAL DATA:  Partial right colectomy, perform  laparoscopically. EXAM: CT ABDOMEN AND PELVIS WITHOUT CONTRAST TECHNIQUE: Multidetector CT imaging of the abdomen and pelvis was performed following the standard protocol without IV contrast. COMPARISON:  March 24, 2018 FINDINGS: Lower chest: Cardiomegaly.  No other abnormalities. Hepatobiliary: Multiple low-attenuation lesions in the liver are most likely cysts, unchanged. The liver and gallbladder are otherwise unremarkable. Pancreas: Unremarkable. No pancreatic ductal dilatation or surrounding inflammatory changes. Spleen: Normal in size without focal abnormality. Adrenals/Urinary Tract: Adrenal glands are normal. A 4.1 cm apparently solid mass is seen in the upper pole of the left kidney measuring 3.9 cm previously. A 2.7 cm mass in the medial left kidney on series 2, image 28 is essentially unchanged and nonspecific. Several renal cysts are noted. There is a solid and cystic appearing mass in the lower pole of the right kidney difficult to compare to the previous study due to lack of contrast today. However, the maximum measurement is approximately 4.6 cm, unchanged. Several probable hyperdense cysts are noted. The bladder is decompressed with a Foley catheter. There is mild fat stranding adjacent to the bladder which is a new finding. No renal stones or hydronephrosis. No ureteral stones. Stomach/Bowel: The stomach and small bowel are normal. The patient is status post partial right colectomy scattered colonic diverticuli are noted. There is a new ventral hernia, located superior to the previously identified umbilical hernia. This is best seen on series 2, image 35. There is fat stranding within the hernia which likely contains omentum. It also contains a portion of transverse colon which does not appear to be inflamed. The wall of the hernia is thickened and inflamed. Surgical changes are seen in the right lower abdomen at the site of right colectomy. There is some increased attenuation in the surrounding fat,  likely postoperative. No collection to suggest abscess or leak. The appendix is been removed along with the right partial colectomy. Vascular/Lymphatic: Mild atherosclerotic changes in the aorta. No obvious adenopathy. Reproductive: Again noted is an enlarged prostate exerting mass effect on the posterior bladder. Other: No abdominal wall hernia or abnormality. No abdominopelvic ascites. Musculoskeletal: No acute or significant osseous findings. IMPRESSION: 1. At least 2 renal masses concerning for renal cell carcinoma. The masses were better assessed with a previous contrast-enhanced study. Recommend  an MRI when the patient is able. 2. There is a new ventral hernia. The periphery of the hernia/hernia wall is thickened and inflamed. The hernia contains transverse colon which does not appear to be inflamed or obstructed. There is also likely some herniated omentum with associated fat stranding. This hernia is new compared to March 24, 2018. No associated bowel obstruction. 3. Inflammatory changes in the right lower quadrant, surrounding the site of right partial colectomy. These findings are likely postsurgical as surgery was only just over 1 week ago. No evidence of leak or abscess. 4. Atherosclerotic changes in the abdominal aorta. Electronically Signed   By: Dorise Bullion III M.D   On: 05/30/2018 12:24   Dg Chest 2 View  Result Date: 06/05/2018 CLINICAL DATA:  Shortness of breath. EXAM: CHEST - 2 VIEW COMPARISON:  Chest CT 05/19/2018 FINDINGS: Chronic cardiomegaly and aortic tortuosity. No pulmonary edema, pleural effusion, focal airspace disease or pneumothorax. No acute osseous abnormalities. IMPRESSION: Cardiomegaly without congestive failure or acute chest finding. Electronically Signed   By: Keith Rake M.D.   On: 06/05/2018 03:21   US Renal  Result Date: 06/06/2018 CLINICAL DATA:  Acute kidney injury, history coronary artery disease, chronic diastolic CHF, COPD, type II diabetes mellitus,  hypertension, hypertensive heart disease, prostate cancer, renal cell carcinoma EXAM: RENAL / URINARY TRACT ULTRASOUND COMPLETE COMPARISON:  CT abdomen and pelvis 06/05/2017 FINDINGS: Right Kidney: Length: 14.5 cm. Normal cortical thickness. Upper normal echogenicity. Cystic lesion at mid kidney, 2.7 x 2.2 x 3.0 cm, containing a few scattered low level internal echoes; this lesion is hyperdense on the prior CT exam. An additional nodule at the inferior pole measures 3.9 x 2.0 x 2.7 cm and demonstrates multiple septations and low-level scattered internal echogenicity. This lesion is mildly complicated and demonstrated low-attenuation and high attenuation components on the prior CT. No hydronephrosis or shadowing calcification. Left Kidney: Length: 16.7 cm. Normal cortical thickness. Minimally increased cortical echogenicity. Small simple cyst at inferior pole, 3.9 x 2.0 x 2.7 cm. Additional complicated cyst at upper pole 2.5 x 2.1 x 2.9 cm with a scattered internal echoes. Additional mass at upper pole appear solid, 4.9 x 4.5 x 4.5 cm, highly suspicious for a renal neoplasm. No hydronephrosis or shadowing calcification. Bladder: Normal appearing bladder. Marked prostatic enlargement, 9.7 x 4.5 x 6.8 cm IMPRESSION: Probable medical renal disease changes of the kidneys. BILATERAL simple renal cysts. Additional solid mass at upper pole LEFT kidney 4.9 cm greatest size most likely representing a solid renal neoplasm, demonstrated enhancement on a prior CT exam of 03/24/2018 and now demonstrates an interval increase in size. Additional complicated cystic lesions at the inferior pole of RIGHT kidney and upper pole LEFT kidney, indeterminate, cannot exclude renal neoplasms; consider MR characterization. Marked prostatic enlargement. Electronically Signed   By: Lavonia Dana M.D.   On: 06/06/2018 17:55   Ct Angio Chest Aorta W &/or Wo Contrast  Result Date: 05/19/2018 CLINICAL DATA:  Yearly follow up thoracic aortic  aneurysm. Hx of recent syncopal episodes in [place] and post prandial. EXAM: CT ANGIOGRAPHY CHEST WITH CONTRAST TECHNIQUE: Multidetector CT imaging of the chest was performed using the standard protocol during bolus administration of intravenous contrast. Multiplanar CT image reconstructions and MIPs were obtained to evaluate the vascular anatomy. CONTRAST:  120mL ISOVUE-370 IOPAMIDOL (ISOVUE-370) INJECTION 76% COMPARISON:  05/30/2017 and previous FINDINGS: Cardiovascular: Cardiomegaly. No pericardial effusion. Dilated central pulmonary arteries. Fair contrast opacification of pulmonary artery branches; the exam was not optimized for detection of  pulmonary emboli. Mild scattered coronary calcifications. Good contrast opacification of the thoracic aorta without dissection or stenosis. Transverse diameters as follows: 5.2 cm sinuses of Valsalva 4.2 cm  sino-tubular junction 4.6 cm mid ascending (previously 4.3 cm) 4.3 cm distal ascending/proximal arch 3.4 cm distal arch 3.2 cm proximal descending 2.8 cm distal descending Bovine variant brachiocephalic arterial origin anatomy without proximal stenosis. Visualized proximal abdominal aorta unremarkable. Ectatic distal celiac axis up to 1.5 cm diameter. Mediastinum/Nodes: No hilar or mediastinal adenopathy. Lungs/Pleura: No pleural effusion.  No pneumothorax.  Lungs clear. Upper Abdomen: Nodular liver contour. Stable hepatic cysts. Exophytic 5.8 cm upper pole left renal lesion measuring above fluid attenuation. No acute findings. Musculoskeletal: 5 mm subcutaneous metallic density in the left anterior chest wall subcutaneous tissues. Mid and lower thoracic spondylitic change. No fracture or worrisome bone lesion. Review of the MIP images confirms the above findings. IMPRESSION: 1. 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 Recommend semi-annual imaging followup by CTA or MRA and referral to  cardiothoracic surgery if not already obtained. This recommendation follows 2010 ACCF/AHA/AATS/ACR/ASA/SCA/SCAI/SIR/STS/SVM Guidelines for the Diagnosis and Management of Patients With Thoracic Aortic Disease. Circulation. 2010; 121: W102-V253 2. 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 Recommend dedicated renal protocol CT or MR with contrast. 3. Coronary calcifications. The severity of coronary artery disease and any potential stenosis cannot be assessed on this non-gated CT examination. Assessment for potential risk factor modification, dietary therapy or pharmacologic therapy may be warranted, if clinically indicated. 4. Nodular liver contour suggesting cirrhosis. Electronically Signed   By: Lucrezia Europe M.D.   On: 05/19/2018 13:45    ASSESSMENT & PLAN:   72 y.o. male with  1. Concern for newly diagnosed Light chain myeloma Component     Latest Ref Rng & Units 06/07/2018  Hep B Core Ab, IgM     Negative Negative  Kappa free light chain     3.3 - 19.4 mg/L 7,790.4 (H)  Lamda free light chains     5.7 - 26.3 mg/L 34.3 (H)  Kappa, lamda light chain ratio     0.26 - 1.65 227.13 (H)   2. ARF on CKD -- multiple etiologies - myeloma, sepsis, BPH etc. 3.Imaging concern for renal cell carcinoma 4. Anemia - likely primarily due to myeloma but also from sepsis, ckd PLAN -Discussed patient's most recent labs from 06/10/18, anemic with HGB at 6.8 but then received blood transfusion. -transfuse prn to maintain hgb >8 -Discussed that the 06/07/18 [facility] are concerning and meet criteria for light chain myeloma with Kappa elevated at 7790 and K:L ratio elevated at 227 -Proceed with CT guided BM biopsy -Will order additional labs today -Will order 24 hour urine study- UPEP -Whole body skeletal survey -Will continue following pt   -will need outpatient PET/CT scan -will start on steroids once sepsis resolved. -will need urology  consultation for treatment of likely renal cell carcinoma.  All of the patients questions were answered with apparent satisfaction. The patient knows to call the clinic with any problems, questions or concerns.  The total time spent in the appt was 80 minutes and more than 50% was on counseling and direct patient cares.    Sullivan Lone MD [place] AAHIVMS [facility] Hematology/Oncology Physician [facility]  (Office):       [phone] (Work cell):  [phone] (Fax):  [phone]  06/10/2018 5:52 PM  I, Baldwin [place], am acting as a scribe for Dr. Irene Limbo  .I have reviewed the above documentation for accuracy and completeness, and I agree with the above. Sullivan Lone MD MS

## 2018-06-10 NOTE — Progress Notes (Signed)
CRITICAL VALUE ALERT  Critical Value:  Hbg 6.8  Date & Time Notied: 06/10/18  0541  Provider Notified: Schorr, NP  Orders Received/Actions taken: order to transfuse 1 unit PRBCs

## 2018-06-10 NOTE — Progress Notes (Signed)
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 4.4L UOP yesterday . SCr slightly improved . Hb 6.8 for 1u PRBC . Pt w/o complaint . SFLC with K:L ratio of 227; SPEP without M Spike . C3 and C4 WNL, ANA neg, MPO; PR3 ANCA positive weakly; HCV, HBV neg   10/08 0701 - 10/09 0700 In: 800 [I.V.:700; IV Piggyback:100] Out: 4400 [Urine:4400]  Filed Weights   06/05/18 1251 06/09/18 0500 06/10/18 0506  Weight: 99.8 kg 103.4 kg 102.2 kg    Scheduled Meds: . sodium chloride   Intravenous Once  . amLODipine  5 mg Oral Daily  . finasteride  5 mg Oral Daily  . heparin  5,000 Units Subcutaneous Q8H  . tamsulosin  0.4 mg Oral QHS   Continuous Infusions: . sodium chloride Stopped (06/10/18 0420)  . ampicillin-sulbactam (UNASYN) IV 3 g (06/09/18 2347)   PRN Meds:.acetaminophen **OR** acetaminophen, albuterol, HYDROcodone-acetaminophen, metoprolol tartrate, ondansetron **OR** ondansetron (ZOFRAN) IV, senna-docusate  Current Labs: reviewed  SPEP + SFLC pending C3, C4, ANAI, ANCA, Hep Panel pending  Physical Exam:  Blood pressure (!) 155/89, pulse 64, temperature 97.6 F (36.4 C), temperature source Oral, resp. rate 16, height 5\' 6"  (1.676 m), weight 102.2 kg, SpO2 100 %. NAD, sitting in chair RRR nl s1s2 CtAB Trace Eemea No rashes/ Nonfocal, CN2-12 intace EOMI NCAT Foley catheter in place  A 1. AoCKD3 likely from uti+ sepsps and/or obstruction req foley, now UOP excellent and SCr sslightly improved. Serologies surprisingly with abnormal light chains, elevated Kappa, and weakly increased PR3 ANCA.    2. Abnormal SFLC; elevated Kappa level 3. Weakly positive PR3 with hematuria 4. BPH with retention, on flomax/proscar, with foley 5. Acinetobacter bacteremia and UTI: on unasyn, ID following 6. Anemia, transfuse 1u 10/9 planned 7. CAD  8. 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  Given his anemia and ongoing therapy for Acinetobacter bacteremia and UTI he is not currently a candidate for renal biopsy.  As he stabilizes this will need to be reconsidered.  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 . Patient will need hematology input regarding monoclonal process. . Renal function suggests recovery today, continue to monitor . Will need a trial of voiding prior to discharge . Daily weights, Daily Renal Panel, Strict I/Os, Avoid nephrotoxins (NSAIDs, judicious IV Contrast)   [name] MD 06/10/2018, 11:21 AM  Recent Labs  Lab 06/08/18 0553 06/09/18 1019 06/10/18 0337  NA 136 139 138  K 3.5 3.6 3.6  CL 105 105 105  CO2 22 20* 22  GLUCOSE 101* 140* 93  BUN 66* 64* 63*  CREATININE 5.91* 5.91* 5.77*  CALCIUM 8.2* 8.5* 8.2*  PHOS 4.1  --  5.0*   Recent Labs  Lab 06/05/18 0258  06/08/18 0553 06/09/18 1019 06/10/18 0337  WBC 13.4*   < > 6.6 6.5 7.5  NEUTROABS 12.5*  --   --   --   --   HGB 8.3*   < > 7.6* 7.2* 6.8*  HCT 25.8*   < > 24.1* 23.0* 21.8*  MCV 98.1   < > 99.2 100.4* 98.6  PLT  302   < > 295 267 341   < > = values in this interval not displayed.

## 2018-06-10 NOTE — Progress Notes (Signed)
Walked into patient room and he was standing and his foley was leaking out around the top by his meatus.  I just removed Foley.  Will report to oncoming nurse to trial run and see if pt can void without foley.

## 2018-06-10 NOTE — Progress Notes (Signed)
   06/10/18 1000  Clinical Encounter Type  Visited With Patient  Visit Type Initial  Referral From Other (Comment)   While doing rounds PT invited me in. PT was wanting to talk and thankful for the visit. I offered words of encouragement and prayer. Chaplain available as needed.

## 2018-06-10 NOTE — Progress Notes (Signed)
PROGRESS NOTE        PATIENT DETAILS Name: [name] [name] Age: 72 y.o. Sex: male Date of Birth: 11-23-1945 Admit Date: 06/05/2018 Admitting Physician Albertine Patricia, MD [id number], Christean Grief, MD  Brief Narrative: Patient is a 72 y.o. male with history of CAD, hypertension, PAF not on anticoagulation-recent right hemicolectomy for a cecal mass-just discharged by the general surgical service on 6/96-[id number] course was complicated by development of obstructive uropathy and AKI ( discharged with a Foley catheter, successful outpatient voiding trial on 10/1)-presented to the hospital with sepsis secondary to Acinetobacter bacteremia, acute kidney injury.  Further work-up demonstrates significantly elevated serum kappa light chains, mildly positive ANCA serology.  See below for further details  Subjective: Continues to express desire to be discharged-but understands that he needs a blood transfusion.  Creatinine slowly improving-very good urine output.  Denies any chest pain or shortness of breath.  Assessment/Plan: Sepsis secondary to Acinetobacter bacteremia along with Acinetobacter and enterococcal UTI: Probably secondary to recent hospitalization for laparotomy/recent Foley catheter placement.  Sepsis pathophysiology has resolved-continue IV Unasyn.  Discuss with infectious disease prior to discharge for appropriate antimicrobial regimen-will continue with IV Unasyn for now.  AKI on CKD stage III: Thought to have hemodynamically mediated AKI with some contribution from obstructive uropathy.  Great urine output overnight-appears to be in the polyuric stage of ATN.  However, has significantly elevated kappa light chains-also has a weakly positive ANCA serology.  Nephrology following-contemplating kidney biopsy once bacteremia has resolved.  Elevated free kappa light chains: Found with routine work-up for renal failure-does have anemia that patient seems to have developed in  the past 1 year.  Obviously renal failure is thought to be hemodynamically/obstruction mediated-but due to significantly elevated free kappa light chains-concern for myeloma kidney.  Case was discussed with Dr. Jordan Likes bone marrow biopsy, bone survey-he will evaluate and provide further recommendations.  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  Hypokalemia: Repleted  BPH with recent urinary retention/obstructive uropathy requiring Foley catheter placement: Foley catheter inserted on 10/5 due to worsening renal function concern for obstructive uropathy which could be contributing to AKI-once renal function improves further-voiding trial will be attempted in the next few days.  Continue Flomax and Proscar.  Anemia: Initially felt to be multifactorial etiology-probably secondary to chronic disease and worsened with acute illness and IV fluid hydration.  However with concern for multiple myeloma-anemia may be related to that as well.  Hemoglobin has decreased to 6.8-being transfused 1 unit of PRBC.  Hematology/oncology consulting-see above.  Chronic diastolic heart failure: Minimal leg edema continues-cautiously continue with IV fluids.  Hypertension: Blood pressure stable-continue amlodipine  CAD: No anginal symptoms-follow.  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  DVT Prophylaxis: Prophylactic Heparin   Code Status: Full code  Family Communication: None at bedside if an attempted to call patient's son and daughter-unable to get in touch with him.  Disposition Plan: Remain inpatient-probably require several more days of hospitalization-especially if he does have myeloma kidney.  Patient has indicated over the past several days that he will leave the hospital-he is not ready for discharge-he is aware of the life threatening and life disabling risk of leaving  AMA.  Antimicrobial agents: Anti-infectives (From admission, onward)   Start     Dose/Rate Route Frequency Ordered Stop  06/08/18 1200  Ampicillin-Sulbactam (UNASYN) 3 g in sodium chloride 0.9 % 100 mL IVPB     3 g 200 mL/hr over 30 Minutes Intravenous Every 12 hours 06/08/18 1051     06/08/18 1100  Ampicillin-Sulbactam (UNASYN) 3 g in sodium chloride 0.9 % 100 mL IVPB  Status:  Discontinued     3 g 200 mL/hr over 30 Minutes Intravenous Every 24 hours 06/08/18 1049 06/08/18 1051   06/08/18 1000  meropenem (MERREM) 1 g in sodium chloride 0.9 % 100 mL IVPB  Status:  Discontinued     1 g 200 mL/hr over 30 Minutes Intravenous Every 24 hours 06/07/18 1303 06/08/18 1026   06/06/18 1345  meropenem (MERREM) 1 g in sodium chloride 0.9 % 100 mL IVPB  Status:  Discontinued     1 g 200 mL/hr over 30 Minutes Intravenous Every 12 hours 06/06/18 1333 06/07/18 1303   06/06/18 0800  ceFEPIme (MAXIPIME) 1 g in sodium chloride 0.9 % 100 mL IVPB  Status:  Discontinued     1 g 200 mL/hr over 30 Minutes Intravenous Every 24 hours 06/05/18 1056 06/06/18 1333   06/05/18 0900  vancomycin (VANCOCIN) 2,000 mg in sodium chloride 0.9 % 500 mL IVPB     2,000 mg 250 mL/hr over 120 Minutes Intravenous  Once 06/05/18 0816 06/05/18 1149   06/05/18 0730  ceFEPIme (MAXIPIME) 2 g in sodium chloride 0.9 % 100 mL IVPB     2 g 200 mL/hr over 30 Minutes Intravenous  Once 06/05/18 0720 06/05/18 0833   06/05/18 0730  metroNIDAZOLE (FLAGYL) IVPB 500 mg  Status:  Discontinued     500 mg 100 mL/hr over 60 Minutes Intravenous Every 8 hours 06/05/18 0720 06/05/18 1035   06/05/18 0730  vancomycin (VANCOCIN) IVPB 1000 mg/200 mL premix  Status:  Discontinued     1,000 mg 200 mL/hr over 60 Minutes Intravenous  Once 06/05/18 0720 06/05/18 0816      Procedures: None  CONSULTS:  None  Time spent: 25 minutes-Greater than 50% of this time was spent in counseling, explanation of diagnosis, planning of further management, and  coordination of care.  MEDICATIONS: Scheduled Meds: . sodium chloride   Intravenous Once  . amLODipine  5 mg Oral Daily  . finasteride  5 mg Oral Daily  . heparin  5,000 Units Subcutaneous Q8H  . tamsulosin  0.4 mg Oral QHS   Continuous Infusions: . sodium chloride Stopped (06/10/18 0420)  . ampicillin-sulbactam (UNASYN) IV 3 g (06/09/18 2347)   PRN Meds:.acetaminophen **OR** acetaminophen, albuterol, HYDROcodone-acetaminophen, metoprolol tartrate, ondansetron **OR** ondansetron (ZOFRAN) IV, senna-docusate   PHYSICAL EXAM: Vital signs: Vitals:   06/10/18 1146 06/10/18 1148 06/10/18 1204 06/10/18 1352  BP: (!) 144/91 (!) 159/96 (!) 138/93 (!) 141/67  Pulse: (!) 58 (!) 53 61 65  Resp: 12 17 12 12   Temp: (!) 97.4 F (36.3 C) (!) 97.4 F (36.3 C) 98 F (36.7 C) 98 F (36.7 C)  TempSrc: Oral Oral Oral Oral  SpO2: 100% 100% 100% 100%  Weight:      Height:       Filed Weights   06/05/18 1251 06/09/18 0500 06/10/18 0506  Weight: 99.8 kg 103.4 kg 102.2 kg   Body mass index is 36.38 kg/m.   General appearance:Awake, alert, not in any distress.  Eyes:no scleral icterus. HEENT: Atraumatic and Normocephalic Neck: supple, no JVD. Resp:Good air entry bilaterally,no rales or rhonchi CVS: S1 S2 regular, no murmurs.  GI: Bowel sounds present, Non tender  and not distended with no gaurding, rigidity or rebound. Extremities: B/L Lower Ext shows trace edema, both legs are warm to touch Neurology:  Non focal Psychiatric: Normal judgment and insight. Normal mood. Musculoskeletal:No digital cyanosis Skin:No Rash, warm and dry Wounds:N/A  I have personally reviewed following labs and imaging studies  LABORATORY DATA: CBC: Recent Labs  Lab 06/05/18 0258 06/06/18 0330 06/07/18 0412 06/08/18 0553 06/09/18 1019 06/10/18 0337  WBC 13.4* 8.6 6.9 6.6 6.5 7.5  NEUTROABS 12.5*  --   --   --   --   --   HGB 8.3* 8.0* 7.1* 7.6* 7.2* 6.8*  HCT 25.8* 24.9* 21.8* 24.1* 23.0* 21.8*  MCV  98.1 98.4 98.6 99.2 100.4* 98.6  PLT 302 288 284 295 267 749    Basic Metabolic Panel: Recent Labs  Lab 06/06/18 0330 06/07/18 0412 06/08/18 0553 06/09/18 1019 06/10/18 0337  NA 137 139 136 139 138  K 3.4* 3.8 3.5 3.6 3.6  CL 103 107 105 105 105  CO2 21* 22 22 20* 22  GLUCOSE 103* 95 101* 140* 93  BUN 56* 64* 66* 64* 63*  CREATININE 4.84* 5.99* 5.91* 5.91* 5.77*  CALCIUM 8.6* 8.5* 8.2* 8.5* 8.2*  PHOS  --   --  4.1  --  5.0*    GFR: Estimated Creatinine Clearance: 12.6 mL/min (A) (by C-G formula based on SCr of 5.77 mg/dL (H)).  Liver Function Tests: Recent Labs  Lab 06/05/18 0258 06/08/18 0553 06/10/18 0337  AST 26  --   --   ALT 18  --   --   ALKPHOS 78  --   --   BILITOT 1.7*  --   --   PROT 6.8  --   --   ALBUMIN 3.2* 2.9* 2.8*   No results for input(s): LIPASE, AMYLASE in the last 168 hours. No results for input(s): AMMONIA in the last 168 hours.  Coagulation Profile: No results for input(s): INR, PROTIME in the last 168 hours.  Cardiac Enzymes: No results for input(s): CKTOTAL, CKMB, CKMBINDEX, TROPONINI in the last 168 hours.  BNP (last 3 results) No results for input(s): PROBNP in the last 8760 hours.  HbA1C: No results for input(s): HGBA1C in the last 72 hours.  CBG: Recent Labs  Lab 06/08/18 2137 06/09/18 0802 06/09/18 1134 06/09/18 1652 06/10/18 0852  GLUCAP 101* 88 116* 124* 87    Lipid Profile: No results for input(s): CHOL, HDL, LDLCALC, TRIG, CHOLHDL, LDLDIRECT in the last 72 hours.  Thyroid Function Tests: No results for input(s): TSH, T4TOTAL, FREET4, T3FREE, THYROIDAB in the last 72 hours.  Anemia Panel: No results for input(s): VITAMINB12, FOLATE, FERRITIN, TIBC, IRON, RETICCTPCT in the last 72 hours.  Urine analysis:    Component Value Date/Time   COLORURINE STRAW (A) 06/07/2018 1643   APPEARANCEUR CLEAR 06/07/2018 1643   LABSPEC 1.008 06/07/2018 1643   PHURINE 6.0 06/07/2018 1643   GLUCOSEU NEGATIVE 06/07/2018 1643    HGBUR LARGE (A) 06/07/2018 1643   BILIRUBINUR NEGATIVE 06/07/2018 1643   KETONESUR NEGATIVE 06/07/2018 1643   PROTEINUR 30 (A) 06/07/2018 1643   NITRITE NEGATIVE 06/07/2018 1643   LEUKOCYTESUR MODERATE (A) 06/07/2018 1643    Sepsis Labs: Lactic Acid, Venous    Component Value Date/Time   LATICACIDVEN 1.63 06/05/2018 0310    MICROBIOLOGY: Recent Results (from the past 240 hour(s))  Blood culture (routine x 2)     Status: Abnormal (Preliminary result)   Collection Time: 06/05/18  2:52 AM  Result Value Ref  Range Status   Specimen Description BLOOD LEFT HAND  Final   Special Requests   Final    BOTTLES DRAWN AEROBIC AND ANAEROBIC Blood Culture adequate volume   Culture  Setup Time   Final    IN BOTH AEROBIC AND ANAEROBIC BOTTLES GRAM NEGATIVE RODS CRITICAL RESULT CALLED TO, READ BACK BY AND VERIFIED WITH: L SEAY PHARMD 06/06/18 1225 JDW    Culture (A)  Final    ACINETOBACTER CALCOACETICUS/BAUMANNII COMPLEX Sent to [place] for further susceptibility testing. Performed at [facility], [place] [address] [place], [place] [address]    Report Status PENDING  Incomplete   Organism ID, Bacteria ACINETOBACTER CALCOACETICUS/BAUMANNII COMPLEX  Final      Susceptibility   Acinetobacter calcoaceticus/baumannii complex - MIC*    CEFTAZIDIME 8 SENSITIVE Sensitive     CEFTRIAXONE 16 INTERMEDIATE Intermediate     CIPROFLOXACIN 0.5 SENSITIVE Sensitive     GENTAMICIN 4 SENSITIVE Sensitive     IMIPENEM <=0.25 SENSITIVE Sensitive     PIP/TAZO <=4 SENSITIVE Sensitive     TRIMETH/SULFA <=20 SENSITIVE Sensitive     CEFEPIME 8 SENSITIVE Sensitive     AMPICILLIN/SULBACTAM <=2 SENSITIVE Sensitive     * ACINETOBACTER CALCOACETICUS/BAUMANNII COMPLEX  Blood Culture ID Panel (Reflexed)     Status: None   Collection Time: 06/05/18  2:52 AM  Result Value Ref Range Status   Enterococcus species NOT DETECTED NOT DETECTED Final    Comment: CRITICAL RESULT CALLED TO, READ BACK BY AND VERIFIED  WITH: L SEAY PHARMD 06/06/18 1225 JDW    Listeria monocytogenes NOT DETECTED NOT DETECTED Final   Staphylococcus species NOT DETECTED NOT DETECTED Final   Staphylococcus aureus (BCID) NOT DETECTED NOT DETECTED Final   Streptococcus species NOT DETECTED NOT DETECTED Final   Streptococcus agalactiae NOT DETECTED NOT DETECTED Final   Streptococcus pneumoniae NOT DETECTED NOT DETECTED Final   Streptococcus pyogenes NOT DETECTED NOT DETECTED Final   Acinetobacter baumannii NOT DETECTED NOT DETECTED Final   Enterobacteriaceae species NOT DETECTED NOT DETECTED Final   Enterobacter cloacae complex NOT DETECTED NOT DETECTED Final   Escherichia coli NOT DETECTED NOT DETECTED Final   Klebsiella oxytoca NOT DETECTED NOT DETECTED Final   Klebsiella pneumoniae NOT DETECTED NOT DETECTED Final   Proteus species NOT DETECTED NOT DETECTED Final   Serratia marcescens NOT DETECTED NOT DETECTED Final   Haemophilus influenzae NOT DETECTED NOT DETECTED Final   Neisseria meningitidis NOT DETECTED NOT DETECTED Final   Pseudomonas aeruginosa NOT DETECTED NOT DETECTED Final   Candida albicans NOT DETECTED NOT DETECTED Final   Candida glabrata NOT DETECTED NOT DETECTED Final   Candida krusei NOT DETECTED NOT DETECTED Final   Candida parapsilosis NOT DETECTED NOT DETECTED Final   Candida tropicalis NOT DETECTED NOT DETECTED Final    Comment: Performed at [facility], [place] [address], [place], [place] [address]  Blood culture (routine x 2)     Status: None   Collection Time: 06/05/18  3:00 AM  Result Value Ref Range Status   Specimen Description BLOOD RIGHT ANTECUBITAL  Final   Special Requests   Final    BOTTLES DRAWN AEROBIC AND ANAEROBIC Blood Culture adequate volume   Culture   Final    NO GROWTH 5 DAYS Performed at [facility], [place] [address] [place],  [address]    Report Status 06/10/2018 FINAL  Final  Culture, Urine     Status: Abnormal   Collection Time: 06/05/18  8:00 AM  Result Value Ref Range Status   Specimen Description URINE, RANDOM  Final   Special Requests   Final    NONE Performed at [facility], [address], [place], [place] [address]    Culture (A)  Final    >=100,000 COLONIES/mL ENTEROCOCCUS FAECALIS 10,000 COLONIES/mL ACINETOBACTER CALCOACETICUS/BAUMANNII COMPLEX    Report Status 06/08/2018 FINAL  Final   Organism ID, Bacteria ENTEROCOCCUS FAECALIS (A)  Final   Organism ID, Bacteria ACINETOBACTER CALCOACETICUS/BAUMANNII COMPLEX (A)  Final      Susceptibility   Acinetobacter calcoaceticus/baumannii complex - MIC*    CEFTAZIDIME 8 SENSITIVE Sensitive     CEFTRIAXONE 16 INTERMEDIATE Intermediate     CIPROFLOXACIN 1 SENSITIVE Sensitive     GENTAMICIN 4 SENSITIVE Sensitive     IMIPENEM <=0.25 SENSITIVE Sensitive     PIP/TAZO >=128 RESISTANT Resistant     TRIMETH/SULFA <=20 SENSITIVE Sensitive     CEFEPIME 8 SENSITIVE Sensitive     AMPICILLIN/SULBACTAM <=2 SENSITIVE Sensitive     * 10,000 COLONIES/mL ACINETOBACTER CALCOACETICUS/BAUMANNII COMPLEX   Enterococcus faecalis - MIC*    AMPICILLIN <=2 SENSITIVE Sensitive     LEVOFLOXACIN 1 SENSITIVE Sensitive     NITROFURANTOIN <=16 SENSITIVE Sensitive     VANCOMYCIN 1 SENSITIVE Sensitive     * >=100,000 COLONIES/mL ENTEROCOCCUS FAECALIS  MRSA PCR Screening     Status: None   Collection Time: 06/05/18  1:04 PM  Result Value Ref Range Status   MRSA by PCR NEGATIVE NEGATIVE Final    Comment:        The GeneXpert MRSA Assay (FDA approved for NASAL specimens only), is one component of a comprehensive MRSA colonization surveillance program. It is not intended to diagnose MRSA infection nor to guide or monitor treatment for MRSA infections. Performed at [facility], [place] [address], [place], [place] [address]     RADIOLOGY STUDIES/RESULTS: Ct Abdomen Pelvis Wo Contrast  Result Date: 06/05/2018 CLINICAL DATA:  Abdominal pain, fever.  Recent partial colectomy. EXAM: CT  ABDOMEN AND PELVIS WITHOUT CONTRAST TECHNIQUE: Multidetector CT imaging of the abdomen and pelvis was performed following the standard protocol without IV contrast. COMPARISON:  05/30/2018 FINDINGS: Lower chest: Cardiomegaly.  Bibasilar atelectasis.  No effusions. Hepatobiliary: Small scattered hepatic cysts, small scattered hepatic low-density lesions throughout the liver, likely cysts although these cannot be characterized fully without intravenous contrast. Gallbladder unremarkable. Pancreas: No focal abnormality or ductal dilatation. Spleen: No focal abnormality.  Normal size. Adrenals/Urinary Tract: Numerous low-density, mixed density and high-density masses throughout the kidney are stable since recent study and cannot be characterized fully without intravenous contrast. At least 2 of these masses were shown on prior contrast enhanced CT to be suspicious for renal cell carcinoma. No hydronephrosis. Adrenal glands unremarkable. Urinary bladder decompressed and mildly thick walled, likely related to prostate enlargement. Stomach/Bowel: Descending colonic and sigmoid diverticulosis. Postoperative changes in the right colon from right hemicolectomy, stable. Again seen is the ventral hernia containing a portion of the mid transverse colon which appears unremarkable. No evidence of obstruction or bowel wall thickening. Stomach and small bowel grossly unremarkable. No evidence of bowel obstruction. Vascular/Lymphatic: Aortic atherosclerosis. No enlarged abdominal or pelvic lymph nodes. Reproductive: Marked enlargement of the prostate. Other: No free fluid or free air. Continued wall thickening within the periphery of the midline supraumbilical ventral hernia which also contains the transverse colon. Again, no underlying bowel wall thickening. This has a stable appearance since prior study. Musculoskeletal: No acute bony abnormality. Diffuse degenerative changes  in the lumbar spine. IMPRESSION: Stable appearance of  prior right hemicolectomy. Stable supraumbilical ventral hernia containing a portion of the transverse colon. The wall of the ventral hernia again appears thickened and indistinct, but underlying bowel does not appear abnormal. Scattered diverticulosis.  No active diverticulitis. Marked prostate enlargement with bladder wall thickening. Multiple renal lesions. At least 2 of which were shown on prior contrast-enhanced CT to be suspicious for renal cell carcinoma. No change. Scattered hepatic low-density lesions cannot be characterized without intravenous contrast, likely cysts. Cardiomegaly, bibasilar atelectasis. Aortic atherosclerosis. Electronically Signed   By: Rolm Baptise M.D.   On: 06/05/2018 08:22   Ct Abdomen Pelvis Wo Contrast  Result Date: 05/30/2018 CLINICAL DATA:  Partial right colectomy, perform laparoscopically. EXAM: CT ABDOMEN AND PELVIS WITHOUT CONTRAST TECHNIQUE: Multidetector CT imaging of the abdomen and pelvis was performed following the standard protocol without IV contrast. COMPARISON:  March 24, 2018 FINDINGS: Lower chest: Cardiomegaly.  No other abnormalities. Hepatobiliary: Multiple low-attenuation lesions in the liver are most likely cysts, unchanged. The liver and gallbladder are otherwise unremarkable. Pancreas: Unremarkable. No pancreatic ductal dilatation or surrounding inflammatory changes. Spleen: Normal in size without focal abnormality. Adrenals/Urinary Tract: Adrenal glands are normal. A 4.1 cm apparently solid mass is seen in the upper pole of the left kidney measuring 3.9 cm previously. A 2.7 cm mass in the medial left kidney on series 2, image 28 is essentially unchanged and nonspecific. Several renal cysts are noted. There is a solid and cystic appearing mass in the lower pole of the right kidney difficult to compare to the previous study due to lack of contrast today. However, the maximum measurement is approximately 4.6 cm, unchanged. Several probable hyperdense cysts are  noted. The bladder is decompressed with a Foley catheter. There is mild fat stranding adjacent to the bladder which is a new finding. No renal stones or hydronephrosis. No ureteral stones. Stomach/Bowel: The stomach and small bowel are normal. The patient is status post partial right colectomy scattered colonic diverticuli are noted. There is a new ventral hernia, located superior to the previously identified umbilical hernia. This is best seen on series 2, image 35. There is fat stranding within the hernia which likely contains omentum. It also contains a portion of transverse colon which does not appear to be inflamed. The wall of the hernia is thickened and inflamed. Surgical changes are seen in the right lower abdomen at the site of right colectomy. There is some increased attenuation in the surrounding fat, likely postoperative. No collection to suggest abscess or leak. The appendix is been removed along with the right partial colectomy. Vascular/Lymphatic: Mild atherosclerotic changes in the aorta. No obvious adenopathy. Reproductive: Again noted is an enlarged prostate exerting mass effect on the posterior bladder. Other: No abdominal wall hernia or abnormality. No abdominopelvic ascites. Musculoskeletal: No acute or significant osseous findings. IMPRESSION: 1. At least 2 renal masses concerning for renal cell carcinoma. The masses were better assessed with a previous contrast-enhanced study. Recommend an MRI when the patient is able. 2. There is a new ventral hernia. The periphery of the hernia/hernia wall is thickened and inflamed. The hernia contains transverse colon which does not appear to be inflamed or obstructed. There is also likely some herniated omentum with associated fat stranding. This hernia is new compared to March 24, 2018. No associated bowel obstruction. 3. Inflammatory changes in the right lower quadrant, surrounding the site of right partial colectomy. These findings are likely  postsurgical as surgery was only  just over 1 week ago. No evidence of leak or abscess. 4. Atherosclerotic changes in the abdominal aorta. Electronically Signed   By: Dorise Bullion III M.D   On: 05/30/2018 12:24   Dg Chest 2 View  Result Date: 06/05/2018 CLINICAL DATA:  Shortness of breath. EXAM: CHEST - 2 VIEW COMPARISON:  Chest CT 05/19/2018 FINDINGS: Chronic cardiomegaly and aortic tortuosity. No pulmonary edema, pleural effusion, focal airspace disease or pneumothorax. No acute osseous abnormalities. IMPRESSION: Cardiomegaly without congestive failure or acute chest finding. Electronically Signed   By: Keith Rake M.D.   On: 06/05/2018 03:21   US Renal  Result Date: 06/06/2018 CLINICAL DATA:  Acute kidney injury, history coronary artery disease, chronic diastolic CHF, COPD, type II diabetes mellitus, hypertension, hypertensive heart disease, prostate cancer, renal cell carcinoma EXAM: RENAL / URINARY TRACT ULTRASOUND COMPLETE COMPARISON:  CT abdomen and pelvis 06/05/2017 FINDINGS: Right Kidney: Length: 14.5 cm. Normal cortical thickness. Upper normal echogenicity. Cystic lesion at mid kidney, 2.7 x 2.2 x 3.0 cm, containing a few scattered low level internal echoes; this lesion is hyperdense on the prior CT exam. An additional nodule at the inferior pole measures 3.9 x 2.0 x 2.7 cm and demonstrates multiple septations and low-level scattered internal echogenicity. This lesion is mildly complicated and demonstrated low-attenuation and high attenuation components on the prior CT. No hydronephrosis or shadowing calcification. Left Kidney: Length: 16.7 cm. Normal cortical thickness. Minimally increased cortical echogenicity. Small simple cyst at inferior pole, 3.9 x 2.0 x 2.7 cm. Additional complicated cyst at upper pole 2.5 x 2.1 x 2.9 cm with a scattered internal echoes. Additional mass at upper pole appear solid, 4.9 x 4.5 x 4.5 cm, highly suspicious for a renal neoplasm. No hydronephrosis or  shadowing calcification. Bladder: Normal appearing bladder. Marked prostatic enlargement, 9.7 x 4.5 x 6.8 cm IMPRESSION: Probable medical renal disease changes of the kidneys. BILATERAL simple renal cysts. Additional solid mass at upper pole LEFT kidney 4.9 cm greatest size most likely representing a solid renal neoplasm, demonstrated enhancement on a prior CT exam of 03/24/2018 and now demonstrates an interval increase in size. Additional complicated cystic lesions at the inferior pole of RIGHT kidney and upper pole LEFT kidney, indeterminate, cannot exclude renal neoplasms; consider MR characterization. Marked prostatic enlargement. Electronically Signed   By: Lavonia Dana M.D.   On: 06/06/2018 17:55   Ct Angio Chest Aorta W &/or Wo Contrast  Result Date: 05/19/2018 CLINICAL DATA:  Yearly follow up thoracic aortic aneurysm. Hx of recent syncopal episodes in [place] and post prandial. EXAM: CT ANGIOGRAPHY CHEST WITH CONTRAST TECHNIQUE: Multidetector CT imaging of the chest was performed using the standard protocol during bolus administration of intravenous contrast. Multiplanar CT image reconstructions and MIPs were obtained to evaluate the vascular anatomy. CONTRAST:  172m ISOVUE-370 IOPAMIDOL (ISOVUE-370) INJECTION 76% COMPARISON:  05/30/2017 and previous FINDINGS: Cardiovascular: Cardiomegaly. No pericardial effusion. Dilated central pulmonary arteries. Fair contrast opacification of pulmonary artery branches; the exam was not optimized for detection of pulmonary emboli. Mild scattered coronary calcifications. Good contrast opacification of the thoracic aorta without dissection or stenosis. Transverse diameters as follows: 5.2 cm sinuses of Valsalva 4.2 cm  sino-tubular junction 4.6 cm mid ascending (previously 4.3 cm) 4.3 cm distal ascending/proximal arch 3.4 cm distal arch 3.2 cm proximal descending 2.8 cm distal descending Bovine variant brachiocephalic arterial origin anatomy without proximal stenosis.  Visualized proximal abdominal aorta unremarkable. Ectatic distal celiac axis up to 1.5 cm diameter. Mediastinum/Nodes: No hilar or mediastinal adenopathy. Lungs/Pleura: No  pleural effusion.  No pneumothorax.  Lungs clear. Upper Abdomen: Nodular liver contour. Stable hepatic cysts. Exophytic 5.8 cm upper pole left renal lesion measuring above fluid attenuation. No acute findings. Musculoskeletal: 5 mm subcutaneous metallic density in the left anterior chest wall subcutaneous tissues. Mid and lower thoracic spondylitic change. No fracture or worrisome bone lesion. Review of the MIP images confirms the above findings. IMPRESSION: 1. 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 Recommend semi-annual imaging followup by CTA or MRA and referral to cardiothoracic surgery if not already obtained. This recommendation follows 2010 ACCF/AHA/AATS/ACR/ASA/SCA/SCAI/SIR/STS/SVM Guidelines for the Diagnosis and Management of Patients With Thoracic Aortic Disease. Circulation. 2010; 121: F026-V785 2. 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 Recommend dedicated renal protocol CT or MR with contrast. 3. Coronary calcifications. The severity of coronary artery disease and any potential stenosis cannot be assessed on this non-gated CT examination. Assessment for potential risk factor modification, dietary therapy or pharmacologic therapy may be warranted, if clinically indicated. 4. Nodular liver contour suggesting cirrhosis. Electronically Signed   By: Lucrezia Europe M.D.   On: 05/19/2018 13:45     LOS: 5 days   Oren Binet, MD  [facility]  If 7PM-7AM, please contact night-coverage  Please page via [email or web address] on MD name and type text message  06/10/2018, 4:20 PM

## 2018-06-11 DIAGNOSIS — R7881 Bacteremia: Secondary | ICD-10-CM

## 2018-06-11 LAB — RENAL FUNCTION PANEL
Albumin: 3 g/dL — ABNORMAL LOW (ref 3.5–5.0)
Anion gap: 13 (ref 5–15)
BUN: 60 mg/dL — ABNORMAL HIGH (ref 8–23)
CHLORIDE: 103 mmol/L (ref 98–111)
CO2: 22 mmol/L (ref 22–32)
CREATININE: 5.5 mg/dL — AB (ref 0.61–1.24)
Calcium: 8.5 mg/dL — ABNORMAL LOW (ref 8.9–10.3)
GFR calc non Af Amer: 9 mL/min — ABNORMAL LOW (ref >60)
GFR, EST AFRICAN AMERICAN: 11 mL/min — AB (ref >60)
Glucose, Bld: 91 mg/dL (ref 70–99)
POTASSIUM: 3.6 mmol/L (ref 3.5–5.1)
Phosphorus: 5 mg/dL — ABNORMAL HIGH (ref 2.5–4.6)
Sodium: 138 mmol/L (ref 135–145)

## 2018-06-11 LAB — CBC
HEMATOCRIT: 25.4 % — AB (ref 39.0–52.0)
Hemoglobin: 8.3 g/dL — ABNORMAL LOW (ref 13.0–17.0)
MCH: 31.8 pg (ref 26.0–34.0)
MCHC: 32.7 g/dL (ref 30.0–36.0)
MCV: 97.3 fL (ref 80.0–100.0)
PLATELETS: 383 10*3/uL (ref 150–400)
RBC: 2.61 MIL/uL — AB (ref 4.22–5.81)
RDW: 12.9 % (ref 11.5–15.5)
WBC: 7.4 10*3/uL (ref 4.0–10.5)
nRBC: 0 % (ref 0.0–0.2)

## 2018-06-11 MED ORDER — HEPARIN SODIUM (PORCINE) 5000 UNIT/ML IJ SOLN
5000.0000 [IU] | Freq: Three times a day (TID) | INTRAMUSCULAR | Status: DC
  Administered 2018-06-12 – 2018-06-14 (×6): 5000 [IU] via SUBCUTANEOUS
  Filled 2018-06-11 (×11): qty 1

## 2018-06-11 MED ORDER — SALINE SPRAY 0.65 % NA SOLN
1.0000 | NASAL | Status: DC | PRN
  Administered 2018-06-11: 1 via NASAL
  Filled 2018-06-11: qty 44

## 2018-06-11 NOTE — Progress Notes (Signed)
PROGRESS NOTE        PATIENT DETAILS Name: [name] [name] Age: 72 y.o. Sex: male Date of Birth: [birth year] Admit Date: 06/05/2018 Admitting Physician Albertine Patricia, MD NFA:[id number], Christean Grief, MD  Brief Narrative: Patient is a 72 y.o. male with history of CAD, hypertension, PAF not on anticoagulation-recent right hemicolectomy for a cecal mass-just discharged by the general surgical service on 7/84-ONGEXBMW course was complicated by development of obstructive uropathy and AKI ( discharged with a Foley catheter, successful outpatient voiding trial on 10/1)-presented to the hospital with sepsis secondary to Acinetobacter bacteremia, acute kidney injury.  Further work-up demonstrates significantly elevated serum kappa light chains, mildly positive ANCA serology.  See below for further details  Subjective: Seems much more,-Foley catheter discontinued yesterday as it was leaking.  He understands that he may need to be in the hospital for the next few days to continue for further work-up of possible light chain myeloma.  Assessment/Plan: Sepsis secondary to Acinetobacter bacteremia along with Acinetobacter and enterococcal UTI: Secondary to recent hospitalization for laparotomy/recent Foley catheter placement.  Sepsis pathophysiology has resolved-remains on IV Unasyn.    AKI on CKD stage III: Initially thought to have hemodynamically mediated kidney injury with some contribution from obstructive uropathy.  However with possibility of light chain myeloma-concern for myeloma kidney as well.  Thankfully he appears to be in the polyuric stage of ATN-creatinine slowly downtrending.  ANCA serologies also weakly positive-nephrology will consider biopsy once he has improved further, and his infection has been adequately treated.  Continue to follow renal function closely-creatinine slowly improving but still way beyond his usual baseline.  Foley catheter was discontinued on 10/9 after it  was found to be leaking-we will continue to follow closely.  Elevated free kappa light chains: Found to significantly elevated free kappa light chains-as part of routine work-up for anemia.  Evaluated by hematology-bone marrow biopsy scheduled for tomorrow.  Bone survey does not show any concerning lesions.   Hypokalemia: Repleted  BPH with recent urinary retention/obstructive uropathy requiring Foley catheter placement: Foley catheter was inserted on 10/5 due to worsening renal function and concern for obstructive uropathy contributing to AKI-with improving renal function-and leaking Foley catheter-this catheter was discontinued on 10/9.  Continue to monitor closely-May need frequent residual volume check.  Continue Flomax and Proscar.   Anemia: She felt to be multifactorial etiology-secondary to chronic kidney disease and worsened by acute illness and IV fluid hydration.  However may have anemia secondary to light chain myeloma as well.  1 unit of PRBC transfused on 10/9.  Hemoglobin currently stable-continue to follow periodically.  Chronic diastolic heart failure: He is euvolemic.  Hypertension: BP controlled-continue amlodipine.  CAD: No anginal symptoms-follow.  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  DVT Prophylaxis: Prophylactic Heparin   Code Status: Full code  Family Communication: None at bedside-called patient's daughter-left voicemail  Disposition Plan: Remain inpatient-probably require several more days of hospitalization-especially if he does have myeloma kidney.  Patient has indicated over the past several days that he will leave the hospital-he is not ready for discharge-he is aware of the life threatening and life disabling risk of leaving AMA.  Antimicrobial agents: Anti-infectives (From admission, onward)   Start     Dose/Rate Route Frequency Ordered Stop   06/08/18 1200   Ampicillin-Sulbactam (UNASYN) 3 g in  sodium chloride 0.9 % 100 mL IVPB     3 g 200 mL/hr over 30 Minutes Intravenous Every 12 hours 06/08/18 1051     06/08/18 1100  Ampicillin-Sulbactam (UNASYN) 3 g in sodium chloride 0.9 % 100 mL IVPB  Status:  Discontinued     3 g 200 mL/hr over 30 Minutes Intravenous Every 24 hours 06/08/18 1049 06/08/18 1051   06/08/18 1000  meropenem (MERREM) 1 g in sodium chloride 0.9 % 100 mL IVPB  Status:  Discontinued     1 g 200 mL/hr over 30 Minutes Intravenous Every 24 hours 06/07/18 1303 06/08/18 1026   06/06/18 1345  meropenem (MERREM) 1 g in sodium chloride 0.9 % 100 mL IVPB  Status:  Discontinued     1 g 200 mL/hr over 30 Minutes Intravenous Every 12 hours 06/06/18 1333 06/07/18 1303   06/06/18 0800  ceFEPIme (MAXIPIME) 1 g in sodium chloride 0.9 % 100 mL IVPB  Status:  Discontinued     1 g 200 mL/hr over 30 Minutes Intravenous Every 24 hours 06/05/18 1056 06/06/18 1333   06/05/18 0900  vancomycin (VANCOCIN) 2,000 mg in sodium chloride 0.9 % 500 mL IVPB     2,000 mg 250 mL/hr over 120 Minutes Intravenous  Once 06/05/18 0816 06/05/18 1149   06/05/18 0730  ceFEPIme (MAXIPIME) 2 g in sodium chloride 0.9 % 100 mL IVPB     2 g 200 mL/hr over 30 Minutes Intravenous  Once 06/05/18 0720 06/05/18 0833   06/05/18 0730  metroNIDAZOLE (FLAGYL) IVPB 500 mg  Status:  Discontinued     500 mg 100 mL/hr over 60 Minutes Intravenous Every 8 hours 06/05/18 0720 06/05/18 1035   06/05/18 0730  vancomycin (VANCOCIN) IVPB 1000 mg/200 mL premix  Status:  Discontinued     1,000 mg 200 mL/hr over 60 Minutes Intravenous  Once 06/05/18 0720 06/05/18 0816      Procedures: None  CONSULTS:  None  Time spent: 25 minutes-Greater than 50% of this time was spent in counseling, explanation of diagnosis, planning of further management, and coordination of care.  MEDICATIONS: Scheduled Meds: . sodium chloride   Intravenous Once  . amLODipine  5 mg Oral Daily  . finasteride  5 mg  Oral Daily  . [START ON 06/12/2018] heparin  5,000 Units Subcutaneous Q8H  . tamsulosin  0.4 mg Oral QHS   Continuous Infusions: . sodium chloride Stopped (06/10/18 0420)  . ampicillin-sulbactam (UNASYN) IV 3 g (06/11/18 0119)   PRN Meds:.acetaminophen **OR** acetaminophen, albuterol, HYDROcodone-acetaminophen, metoprolol tartrate, ondansetron **OR** ondansetron (ZOFRAN) IV, senna-docusate   PHYSICAL EXAM: Vital signs: Vitals:   06/10/18 1352 06/10/18 2240 06/11/18 0529 06/11/18 1502  BP: (!) 141/67 (!) 159/85 (!) 145/93 (!) 158/94  Pulse: 65 (!) 59 (!) 59 (!) 59  Resp: _0 Temp: 98 F (36.7 C) (!) 97.4 F (36.3 C) 98.6 F (37 C)   TempSrc: Oral     SpO2: 100% 100% 98% 100%  Weight:      Height:       Filed Weights   06/05/18 1251 06/09/18 0500 06/10/18 0506  Weight: 99.8 kg 103.4 kg 102.2 kg   Body mass index is 36.38 kg/m.   General appearance:Awake, alert, not in any distress.  Eyes:no scleral icterus. HEENT: Atraumatic and Normocephalic Neck: supple, no JVD. Resp:Good air entry bilaterally,no rales or rhonchi CVS: S1 S2 regular, no murmurs.  GI: Bowel sounds present, Non tender and not distended with no gaurding, rigidity or  rebound. Extremities: B/L Lower Ext shows no edema, both legs are warm to touch Neurology:  Non focal Psychiatric: Normal judgment and insight. Normal mood. Musculoskeletal:No digital cyanosis Skin:No Rash, warm and dry Wounds:N/A  I have personally reviewed following labs and imaging studies  LABORATORY DATA: CBC: Recent Labs  Lab 06/05/18 0258  06/07/18 0412 06/08/18 0553 06/09/18 1019 06/10/18 0337 06/10/18 1605 06/11/18 0324  WBC 13.4*   < > 6.9 6.6 6.5 7.5  --  7.4  NEUTROABS 12.5*  --   --   --   --   --   --   --   HGB 8.3*   < > 7.1* 7.6* 7.2* 6.8* 8.1* 8.3*  HCT 25.8*   < > 21.8* 24.1* 23.0* 21.8* 26.0* 25.4*  MCV 98.1   < > 98.6 99.2 100.4* 98.6  --  97.3  PLT 302   < > 284 295 267 341  --  383   < > =  values in this interval not displayed.    Basic Metabolic Panel: Recent Labs  Lab 06/07/18 0412 06/08/18 0553 06/09/18 1019 06/10/18 0337 06/11/18 0324  NA 139 136 139 138 138  K 3.8 3.5 3.6 3.6 3.6  CL 107 105 105 105 103  CO2 22 22 20* 22 22  GLUCOSE 95 101* 140* 93 91  BUN 64* 66* 64* 63* 60*  CREATININE 5.99* 5.91* 5.91* 5.77* 5.50*  CALCIUM 8.5* 8.2* 8.5* 8.2* 8.5*  PHOS  --  4.1  --  5.0* 5.0*    GFR: Estimated Creatinine Clearance: 13.2 mL/min (A) (by C-G formula based on SCr of 5.5 mg/dL (H)).  Liver Function Tests: Recent Labs  Lab 06/05/18 0258 06/08/18 0553 06/10/18 0337 06/11/18 0324  AST 26  --   --   --   ALT 18  --   --   --   ALKPHOS 78  --   --   --   BILITOT 1.7*  --   --   --   PROT 6.8  --   --   --   ALBUMIN 3.2* 2.9* 2.8* 3.0*   No results for input(s): LIPASE, AMYLASE in the last 168 hours. No results for input(s): AMMONIA in the last 168 hours.  Coagulation Profile: No results for input(s): INR, PROTIME in the last 168 hours.  Cardiac Enzymes: No results for input(s): CKTOTAL, CKMB, CKMBINDEX, TROPONINI in the last 168 hours.  BNP (last 3 results) No results for input(s): PROBNP in the last 8760 hours.  HbA1C: No results for input(s): HGBA1C in the last 72 hours.  CBG: Recent Labs  Lab 06/08/18 2137 06/09/18 0802 06/09/18 1134 06/09/18 1652 06/10/18 0852  GLUCAP 101* 88 116* 124* 87    Lipid Profile: No results for input(s): CHOL, HDL, LDLCALC, TRIG, CHOLHDL, LDLDIRECT in the last 72 hours.  Thyroid Function Tests: No results for input(s): TSH, T4TOTAL, FREET4, T3FREE, THYROIDAB in the last 72 hours.  Anemia Panel: No results for input(s): VITAMINB12, FOLATE, FERRITIN, TIBC, IRON, RETICCTPCT in the last 72 hours.  Urine analysis:    Component Value Date/Time   COLORURINE STRAW (A) 06/07/2018 1643   APPEARANCEUR CLEAR 06/07/2018 1643   LABSPEC 1.008 06/07/2018 1643   PHURINE 6.0 06/07/2018 1643   GLUCOSEU NEGATIVE  06/07/2018 1643   HGBUR LARGE (A) 06/07/2018 1643   BILIRUBINUR NEGATIVE 06/07/2018 1643   KETONESUR NEGATIVE 06/07/2018 1643   PROTEINUR 30 (A) 06/07/2018 1643   NITRITE NEGATIVE 06/07/2018 1643   LEUKOCYTESUR MODERATE (A)  06/07/2018 1643    Sepsis Labs: Lactic Acid, Venous    Component Value Date/Time   LATICACIDVEN 1.63 06/05/2018 0310    MICROBIOLOGY: Recent Results (from the past 240 hour(s))  Blood culture (routine x 2)     Status: Abnormal (Preliminary result)   Collection Time: 06/05/18  2:52 AM  Result Value Ref Range Status   Specimen Description BLOOD LEFT HAND  Final   Special Requests   Final    BOTTLES DRAWN AEROBIC AND ANAEROBIC Blood Culture adequate volume   Culture  Setup Time   Final    IN BOTH AEROBIC AND ANAEROBIC BOTTLES GRAM NEGATIVE RODS CRITICAL RESULT CALLED TO, READ BACK BY AND VERIFIED WITH: L SEAY PHARMD 06/06/18 1225 JDW    Culture (A)  Final    ACINETOBACTER CALCOACETICUS/BAUMANNII COMPLEX Sent to [place] for further susceptibility testing. Performed at [facility], [place] [address] [place], [place] [address]    Report Status PENDING  Incomplete   Organism ID, Bacteria ACINETOBACTER CALCOACETICUS/BAUMANNII COMPLEX  Final      Susceptibility   Acinetobacter calcoaceticus/baumannii complex - MIC*    CEFTAZIDIME 8 SENSITIVE Sensitive     CEFTRIAXONE 16 INTERMEDIATE Intermediate     CIPROFLOXACIN 0.5 SENSITIVE Sensitive     GENTAMICIN 4 SENSITIVE Sensitive     IMIPENEM <=0.25 SENSITIVE Sensitive     PIP/TAZO <=4 SENSITIVE Sensitive     TRIMETH/SULFA <=20 SENSITIVE Sensitive     CEFEPIME 8 SENSITIVE Sensitive     AMPICILLIN/SULBACTAM <=2 SENSITIVE Sensitive     * ACINETOBACTER CALCOACETICUS/BAUMANNII COMPLEX  Blood Culture ID Panel (Reflexed)     Status: None   Collection Time: 06/05/18  2:52 AM  Result Value Ref Range Status   Enterococcus species NOT DETECTED NOT DETECTED Final    Comment: CRITICAL RESULT CALLED TO, READ BACK BY AND  VERIFIED WITH: L SEAY PHARMD 06/06/18 1225 JDW    Listeria monocytogenes NOT DETECTED NOT DETECTED Final   Staphylococcus species NOT DETECTED NOT DETECTED Final   Staphylococcus aureus (BCID) NOT DETECTED NOT DETECTED Final   Streptococcus species NOT DETECTED NOT DETECTED Final   Streptococcus agalactiae NOT DETECTED NOT DETECTED Final   Streptococcus pneumoniae NOT DETECTED NOT DETECTED Final   Streptococcus pyogenes NOT DETECTED NOT DETECTED Final   Acinetobacter baumannii NOT DETECTED NOT DETECTED Final   Enterobacteriaceae species NOT DETECTED NOT DETECTED Final   Enterobacter cloacae complex NOT DETECTED NOT DETECTED Final   Escherichia coli NOT DETECTED NOT DETECTED Final   Klebsiella oxytoca NOT DETECTED NOT DETECTED Final   Klebsiella pneumoniae NOT DETECTED NOT DETECTED Final   Proteus species NOT DETECTED NOT DETECTED Final   Serratia marcescens NOT DETECTED NOT DETECTED Final   Haemophilus influenzae NOT DETECTED NOT DETECTED Final   Neisseria meningitidis NOT DETECTED NOT DETECTED Final   Pseudomonas aeruginosa NOT DETECTED NOT DETECTED Final   Candida albicans NOT DETECTED NOT DETECTED Final   Candida glabrata NOT DETECTED NOT DETECTED Final   Candida krusei NOT DETECTED NOT DETECTED Final   Candida parapsilosis NOT DETECTED NOT DETECTED Final   Candida tropicalis NOT DETECTED NOT DETECTED Final    Comment: Performed at [facility], [place] [address] [place], [place] [address]  Blood culture (routine x 2)     Status: None   Collection Time: 06/05/18  3:00 AM  Result Value Ref Range Status   Specimen Description BLOOD RIGHT ANTECUBITAL  Final   Special Requests   Final    BOTTLES DRAWN AEROBIC  AND ANAEROBIC Blood Culture adequate volume   Culture   Final    NO GROWTH 5 DAYS Performed at [facility], [place] [address] [place], [place] [address]    Report Status 06/10/2018 FINAL  Final  Culture, Urine     Status: Abnormal   Collection Time: 06/05/18  8:00  AM  Result Value Ref Range Status   Specimen Description URINE, RANDOM  Final   Special Requests   Final    NONE Performed at [facility], [place] [address], [place], [place] [address]    Culture (A)  Final    >=100,000 COLONIES/mL ENTEROCOCCUS FAECALIS 10,000 COLONIES/mL ACINETOBACTER CALCOACETICUS/BAUMANNII COMPLEX    Report Status 06/08/2018 FINAL  Final   Organism ID, Bacteria ENTEROCOCCUS FAECALIS (A)  Final   Organism ID, Bacteria ACINETOBACTER CALCOACETICUS/BAUMANNII COMPLEX (A)  Final      Susceptibility   Acinetobacter calcoaceticus/baumannii complex - MIC*    CEFTAZIDIME 8 SENSITIVE Sensitive     CEFTRIAXONE 16 INTERMEDIATE Intermediate     CIPROFLOXACIN 1 SENSITIVE Sensitive     GENTAMICIN 4 SENSITIVE Sensitive     IMIPENEM <=0.25 SENSITIVE Sensitive     PIP/TAZO >=128 RESISTANT Resistant     TRIMETH/SULFA <=20 SENSITIVE Sensitive     CEFEPIME 8 SENSITIVE Sensitive     AMPICILLIN/SULBACTAM <=2 SENSITIVE Sensitive     * 10,000 COLONIES/mL ACINETOBACTER CALCOACETICUS/BAUMANNII COMPLEX   Enterococcus faecalis - MIC*    AMPICILLIN <=2 SENSITIVE Sensitive     LEVOFLOXACIN 1 SENSITIVE Sensitive     NITROFURANTOIN <=16 SENSITIVE Sensitive     VANCOMYCIN 1 SENSITIVE Sensitive     * >=100,000 COLONIES/mL ENTEROCOCCUS FAECALIS  MRSA PCR Screening     Status: None   Collection Time: 06/05/18  1:04 PM  Result Value Ref Range Status   MRSA by PCR NEGATIVE NEGATIVE Final    Comment:        The GeneXpert MRSA Assay (FDA approved for NASAL specimens only), is one component of a comprehensive MRSA colonization surveillance program. It is not intended to diagnose MRSA infection nor to guide or monitor treatment for MRSA infections. Performed at [facility], [place] [address], [place], [place] [address]   Culture, blood (routine x 2)     Status: None (Preliminary result)   Collection Time: 06/10/18  4:50 PM  Result Value Ref Range Status   Specimen Description BLOOD  LEFT HAND  Final   Special Requests   Final    BOTTLES DRAWN AEROBIC ONLY Blood Culture adequate volume   Culture   Final    NO GROWTH < 24 HOURS Performed at [facility], [place] [address], [place], [place] [address]    Report Status PENDING  Incomplete  Culture, blood (routine x 2)     Status: None (Preliminary result)   Collection Time: 06/10/18  5:00 PM  Result Value Ref Range Status   Specimen Description BLOOD RIGHT ANTECUBITAL  Final   Special Requests   Final    BOTTLES DRAWN AEROBIC ONLY Blood Culture adequate volume   Culture   Final    NO GROWTH < 24 HOURS Performed at [facility], [place] [address], [place], [place] [address]    Report Status PENDING  Incomplete    RADIOLOGY STUDIES/RESULTS: Ct Abdomen Pelvis Wo Contrast  Result Date: 06/05/2018 CLINICAL DATA:  Abdominal pain, fever.  Recent partial colectomy. EXAM: CT ABDOMEN AND PELVIS WITHOUT CONTRAST TECHNIQUE: Multidetector CT imaging of the abdomen and  pelvis was performed following the standard protocol without IV contrast. COMPARISON:  05/30/2018 FINDINGS: Lower chest: Cardiomegaly.  Bibasilar atelectasis.  No effusions. Hepatobiliary: Small scattered hepatic cysts, small scattered hepatic low-density lesions throughout the liver, likely cysts although these cannot be characterized fully without intravenous contrast. Gallbladder unremarkable. Pancreas: No focal abnormality or ductal dilatation. Spleen: No focal abnormality.  Normal size. Adrenals/Urinary Tract: Numerous low-density, mixed density and high-density masses throughout the kidney are stable since recent study and cannot be characterized fully without intravenous contrast. At least 2 of these masses were shown on prior contrast enhanced CT to be suspicious for renal cell carcinoma. No hydronephrosis. Adrenal glands unremarkable. Urinary bladder decompressed and mildly thick walled, likely related to prostate enlargement. Stomach/Bowel: Descending colonic  and sigmoid diverticulosis. Postoperative changes in the right colon from right hemicolectomy, stable. Again seen is the ventral hernia containing a portion of the mid transverse colon which appears unremarkable. No evidence of obstruction or bowel wall thickening. Stomach and small bowel grossly unremarkable. No evidence of bowel obstruction. Vascular/Lymphatic: Aortic atherosclerosis. No enlarged abdominal or pelvic lymph nodes. Reproductive: Marked enlargement of the prostate. Other: No free fluid or free air. Continued wall thickening within the periphery of the midline supraumbilical ventral hernia which also contains the transverse colon. Again, no underlying bowel wall thickening. This has a stable appearance since prior study. Musculoskeletal: No acute bony abnormality. Diffuse degenerative changes in the lumbar spine. IMPRESSION: Stable appearance of prior right hemicolectomy. Stable supraumbilical ventral hernia containing a portion of the transverse colon. The wall of the ventral hernia again appears thickened and indistinct, but underlying bowel does not appear abnormal. Scattered diverticulosis.  No active diverticulitis. Marked prostate enlargement with bladder wall thickening. Multiple renal lesions. At least 2 of which were shown on prior contrast-enhanced CT to be suspicious for renal cell carcinoma. No change. Scattered hepatic low-density lesions cannot be characterized without intravenous contrast, likely cysts. Cardiomegaly, bibasilar atelectasis. Aortic atherosclerosis. Electronically Signed   By: Rolm Baptise M.D.   On: 06/05/2018 08:22   Ct Abdomen Pelvis Wo Contrast  Result Date: 05/30/2018 CLINICAL DATA:  Partial right colectomy, perform laparoscopically. EXAM: CT ABDOMEN AND PELVIS WITHOUT CONTRAST TECHNIQUE: Multidetector CT imaging of the abdomen and pelvis was performed following the standard protocol without IV contrast. COMPARISON:  March 24, 2018 FINDINGS: Lower chest:  Cardiomegaly.  No other abnormalities. Hepatobiliary: Multiple low-attenuation lesions in the liver are most likely cysts, unchanged. The liver and gallbladder are otherwise unremarkable. Pancreas: Unremarkable. No pancreatic ductal dilatation or surrounding inflammatory changes. Spleen: Normal in size without focal abnormality. Adrenals/Urinary Tract: Adrenal glands are normal. A 4.1 cm apparently solid mass is seen in the upper pole of the left kidney measuring 3.9 cm previously. A 2.7 cm mass in the medial left kidney on series 2, image 28 is essentially unchanged and nonspecific. Several renal cysts are noted. There is a solid and cystic appearing mass in the lower pole of the right kidney difficult to compare to the previous study due to lack of contrast today. However, the maximum measurement is approximately 4.6 cm, unchanged. Several probable hyperdense cysts are noted. The bladder is decompressed with a Foley catheter. There is mild fat stranding adjacent to the bladder which is a new finding. No renal stones or hydronephrosis. No ureteral stones. Stomach/Bowel: The stomach and small bowel are normal. The patient is status post partial right colectomy scattered colonic diverticuli are noted. There is a new ventral hernia, located superior to the previously identified umbilical  hernia. This is best seen on series 2, image 35. There is fat stranding within the hernia which likely contains omentum. It also contains a portion of transverse colon which does not appear to be inflamed. The wall of the hernia is thickened and inflamed. Surgical changes are seen in the right lower abdomen at the site of right colectomy. There is some increased attenuation in the surrounding fat, likely postoperative. No collection to suggest abscess or leak. The appendix is been removed along with the right partial colectomy. Vascular/Lymphatic: Mild atherosclerotic changes in the aorta. No obvious adenopathy. Reproductive: Again  noted is an enlarged prostate exerting mass effect on the posterior bladder. Other: No abdominal wall hernia or abnormality. No abdominopelvic ascites. Musculoskeletal: No acute or significant osseous findings. IMPRESSION: 1. At least 2 renal masses concerning for renal cell carcinoma. The masses were better assessed with a previous contrast-enhanced study. Recommend an MRI when the patient is able. 2. There is a new ventral hernia. The periphery of the hernia/hernia wall is thickened and inflamed. The hernia contains transverse colon which does not appear to be inflamed or obstructed. There is also likely some herniated omentum with associated fat stranding. This hernia is new compared to March 24, 2018. No associated bowel obstruction. 3. Inflammatory changes in the right lower quadrant, surrounding the site of right partial colectomy. These findings are likely postsurgical as surgery was only just over 1 week ago. No evidence of leak or abscess. 4. Atherosclerotic changes in the abdominal aorta. Electronically Signed   By: Dorise Bullion III M.D   On: 05/30/2018 12:24   Dg Chest 2 View  Result Date: 06/05/2018 CLINICAL DATA:  Shortness of breath. EXAM: CHEST - 2 VIEW COMPARISON:  Chest CT 05/19/2018 FINDINGS: Chronic cardiomegaly and aortic tortuosity. No pulmonary edema, pleural effusion, focal airspace disease or pneumothorax. No acute osseous abnormalities. IMPRESSION: Cardiomegaly without congestive failure or acute chest finding. Electronically Signed   By: Keith Rake M.D.   On: 06/05/2018 03:21   US Renal  Result Date: 06/06/2018 CLINICAL DATA:  Acute kidney injury, history coronary artery disease, chronic diastolic CHF, COPD, type II diabetes mellitus, hypertension, hypertensive heart disease, prostate cancer, renal cell carcinoma EXAM: RENAL / URINARY TRACT ULTRASOUND COMPLETE COMPARISON:  CT abdomen and pelvis 06/05/2017 FINDINGS: Right Kidney: Length: 14.5 cm. Normal cortical thickness.  Upper normal echogenicity. Cystic lesion at mid kidney, 2.7 x 2.2 x 3.0 cm, containing a few scattered low level internal echoes; this lesion is hyperdense on the prior CT exam. An additional nodule at the inferior pole measures 3.9 x 2.0 x 2.7 cm and demonstrates multiple septations and low-level scattered internal echogenicity. This lesion is mildly complicated and demonstrated low-attenuation and high attenuation components on the prior CT. No hydronephrosis or shadowing calcification. Left Kidney: Length: 16.7 cm. Normal cortical thickness. Minimally increased cortical echogenicity. Small simple cyst at inferior pole, 3.9 x 2.0 x 2.7 cm. Additional complicated cyst at upper pole 2.5 x 2.1 x 2.9 cm with a scattered internal echoes. Additional mass at upper pole appear solid, 4.9 x 4.5 x 4.5 cm, highly suspicious for a renal neoplasm. No hydronephrosis or shadowing calcification. Bladder: Normal appearing bladder. Marked prostatic enlargement, 9.7 x 4.5 x 6.8 cm IMPRESSION: Probable medical renal disease changes of the kidneys. BILATERAL simple renal cysts. Additional solid mass at upper pole LEFT kidney 4.9 cm greatest size most likely representing a solid renal neoplasm, demonstrated enhancement on a prior CT exam of 03/24/2018 and now demonstrates an interval  increase in size. Additional complicated cystic lesions at the inferior pole of RIGHT kidney and upper pole LEFT kidney, indeterminate, cannot exclude renal neoplasms; consider MR characterization. Marked prostatic enlargement. Electronically Signed   By: Lavonia Dana M.D.   On: 06/06/2018 17:55   Dg Bone Survey Met  Result Date: 06/10/2018 CLINICAL DATA:  Multiple myeloma EXAM: METASTATIC BONE SURVEY COMPARISON:  None. FINDINGS: Metastatic bone survey was performed. 1. Cervical Spine: Degenerative disc disease C3 through C7. No lytic abnormality. 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 2. Both shoulders: Mild osteoarthritis of the  acromioclavicular and glenohumeral joints bilaterally. No lytic abnormality or fracture. No joint dislocation. 3. Humeri: No lytic abnormality. Mild degenerative subcortical cystic change of the right humeral head adjacent to the greater tuberosity. 4. Bilateral forearms: No lytic abnormality of the radius or ulna on either side. Mild degenerative cystic change of the left scaphoid. 5. Lateral skull: Negative aggressive osteolytic lesions. 6. Pain and lateral thoracic spine: Multilevel degenerative disc disease and endplate spurring. Patchy ill-defined appearance of the mid to lower thoracic vertebral bodies. Cannot exclude small lytic foci. Dedicated CT of the thoracic spine may help for better assessment. 7. Lumbar spine: Mild degenerative change with disc flattening and endplate spurring. No aggressive lytic abnormalities. 8. Chest: Cardiomegaly. Aortic atherosclerosis. Clear lungs. No definite lytic abnormality of the visualized ribs. 9. AP pelvis: No fracture nor aggressive lytic abnormality noted. 10. Femora: Negative for lytic abnormality. 11. Tibia and fibula: Negative bilaterally lytic abnormality. IMPRESSION: Slightly patchy ill-defined appearance of the thoracic spine some which is due to overlap of adjacent ribs and lung limiting assessment. The possibility of subtle lytic abnormalities is not excluded as a result. CT may help for better assessment. Otherwise, no aggressive nor suspicious lytic abnormalities are identified. Electronically Signed   By: Ashley Royalty M.D.   On: 06/10/2018 23:57   Ct Angio Chest Aorta W &/or Wo Contrast  Result Date: 05/19/2018 CLINICAL DATA:  Yearly follow up thoracic aortic aneurysm. Hx of recent syncopal episodes in [place] and post prandial. EXAM: CT ANGIOGRAPHY CHEST WITH CONTRAST TECHNIQUE: Multidetector CT imaging of the chest was performed using the standard protocol during bolus administration of intravenous contrast. Multiplanar CT image reconstructions and MIPs  were obtained to evaluate the vascular anatomy. CONTRAST:  147m ISOVUE-370 IOPAMIDOL (ISOVUE-370) INJECTION 76% COMPARISON:  05/30/2017 and previous FINDINGS: Cardiovascular: Cardiomegaly. No pericardial effusion. Dilated central pulmonary arteries. Fair contrast opacification of pulmonary artery branches; the exam was not optimized for detection of pulmonary emboli. Mild scattered coronary calcifications. Good contrast opacification of the thoracic aorta without dissection or stenosis. Transverse diameters as follows: 5.2 cm sinuses of Valsalva 4.2 cm  sino-tubular junction 4.6 cm mid ascending (previously 4.3 cm) 4.3 cm distal ascending/proximal arch 3.4 cm distal arch 3.2 cm proximal descending 2.8 cm distal descending Bovine variant brachiocephalic arterial origin anatomy without proximal stenosis. Visualized proximal abdominal aorta unremarkable. Ectatic distal celiac axis up to 1.5 cm diameter. Mediastinum/Nodes: No hilar or mediastinal adenopathy. Lungs/Pleura: No pleural effusion.  No pneumothorax.  Lungs clear. Upper Abdomen: Nodular liver contour. Stable hepatic cysts. Exophytic 5.8 cm upper pole left renal lesion measuring above fluid attenuation. No acute findings. Musculoskeletal: 5 mm subcutaneous metallic density in the left anterior chest wall subcutaneous tissues. Mid and lower thoracic spondylitic change. No fracture or worrisome bone lesion. Review of the MIP images confirms the above findings. IMPRESSION: 1. Dilated aortic root and ascending thoracic aortic aneurysm, increased from 4.3 to 4.6 cm since previous exam  1 year ago. Recommend semi-annual imaging followup by CTA or MRA and referral to cardiothoracic surgery if not already obtained. This recommendation follows 2010 ACCF/AHA/AATS/ACR/ASA/SCA/SCAI/SIR/STS/SVM Guidelines for the Diagnosis and Management of Patients With Thoracic Aortic Disease. Circulation. 2010; 121: P694-K982 2. Exophytic upper pole left renal mass with significant  enlargement since exam 1 year ago, suggesting neoplasm. Recommend dedicated renal protocol CT or MR with contrast. 3. Coronary calcifications. The severity of coronary artery disease and any potential stenosis cannot be assessed on this non-gated CT examination. Assessment for potential risk factor modification, dietary therapy or pharmacologic therapy may be warranted, if clinically indicated. 4. Nodular liver contour suggesting cirrhosis. Electronically Signed   By: Lucrezia Europe M.D.   On: 05/19/2018 13:45     LOS: 6 days   Oren Binet, MD  [facility]  If 7PM-7AM, please contact night-coverage  Please page via [email or web address]-click on MD name and type text message  06/11/2018, 3:40 PM

## 2018-06-11 NOTE — Progress Notes (Addendum)
Referring Physician(s): Ghimire,S/Kale,G  Supervising Physician: Markus Daft  Patient Status:  [facility] - In-pt  Chief Complaint: Bacteremia/UTI   Subjective: Patient familiar to IR service from prior right renal mass biopsy in 2016 which was benign. He has a history of coronary artery disease, COPD, hypertension, PAF not on anticoagulation and recent right hemicolectomy for cecal mass with pathology yielding adenocarcinoma.  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  He has undergone prior bone marrow biopsy in 2016 at outside facility which revealed plasma cell dyscrasia.  Patient claims he never followed up with results or received any treatment.  He now presents with persistently elevated kappa free light chains with mildly positive ANCA serology.  Request received for CT-guided bone marrow biopsy for further evaluation.  He currently denies fever, chest pain, dyspnea, cough, abdominal/back pain, nausea, vomiting or bleeding.  He does have occ "fullness in head". Additional hx as below.  Past Medical History:  Diagnosis Date  . Abnormal LFTs (liver function tests)   . Anxiety   . Aortic root dilatation ([place])    a. 12/2015 Echo: mod dil of sinus of valsalva and Ao root (64m).  b. 53 mm Echo 2018  . BPH with urinary obstruction   . Bradycardia   . 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 CAD (coronary artery disease)    a. 12/2015 NSTEMI/Cath (in setting of PAF):  LM nl, LAD 30p,  LCX 386mRCA  ok, AM 100%, RPDA1 40, RPDA 2 60 ->Med Rx.  . Cervical pain 12/09/2014  . Childhood asthma   . Chronic diastolic CHF (congestive heart failure) ([place]   a. 12/2015 Echo: EF 50-55%, mod AI, mod Ao root dil, mild MR, mod dil LA, mod RA.  . Marland KitchenOPD (chronic obstructive  pulmonary disease) ([facility]   pt denies at preop  . Diabetes mellitus type II    diet controlled  . History of syncope   . Hyperlipidemia   . Hypertensive heart disease   . Kidney lump 04/04/2010   Overview:  Renal Cell Carcinoma   . Moderate aortic insufficiency    a. 12/2015 Echo: Mod AI.  . Marland Kitchenveractive bladder   . Paroxysmal atrial fibrillation ([facility]   a. 12/2015 started on Xarelto (CHA2DS2VASc = 4-5).  . Pneumonia   . Prostate cancer ([facility]  . Renal cell carcinoma ([facility]  . Renal lesion   . Sleep apnea    Does not like  CPAP  . Spinal stenosis in cervical region 10/12/2016   Past Surgical History:  Procedure Laterality Date  . BACK SURGERY    . CARDIAC CATHETERIZATION N/A 12/18/2015   Procedure: Left Heart Cath and Coronary Angiography;  Surgeon: DaLeonie ManMD;  [facility];  Service: Cardiovascular;  Laterality: N/A;  . DIAGNOSTIC LAPAROSCOPY     partial colectomy  . LAPAROSCOPIC PARTIAL COLECTOMY Right 05/21/2018   Procedure: LAPAROSCOPIC RIGHT  COLECTOMY ERAS PATHWAY;  Surgeon: ThLeighton RuffMD;  Location: [facility];  Service: General;  Laterality: Right;  . [place] "replaced a disc"       Allergies: Amoxicillin and Penicillins  Medications: Prior to Admission medications   Medication Sig Start Date End Date Taking? Authorizing Provider  albuterol (PROVENTIL HFA;VENTOLIN HFA) 108 (90  Base) MCG/ACT inhaler Inhale 2 puffs into the lungs every 6 (six) hours as needed for wheezing or shortness of breath. 10/18/15  Yes Burns, Alexa R, MD  albuterol (PROVENTIL) (5 MG/ML) 0.5% nebulizer solution Take 0.5 mLs (2.5 mg total) by nebulization every 6 (six) hours as needed for wheezing or shortness of breath. 10/18/15  Yes Burns, Alexa R, MD  amLODipine (NORVASC) 10 MG tablet Take 10 mg by mouth 2 (two) times daily.    Yes [provider]  atorvastatin (LIPITOR) 80 MG tablet Take 1 tablet (80 mg total) by mouth daily. Patient taking  differently: Take 40 mg by mouth 2 (two) times daily.  04/14/18 07/13/18 Yes Hochrein, Jeneen Rinks, MD  KLOR-CON M20 20 MEQ tablet TAKE 1 TABLET BY MOUTH TWICE A DAY Patient taking differently: Take 20 mEq by mouth daily.  04/08/18  Yes Minus Breeding, MD  tamsulosin (FLOMAX) 0.4 MG CAPS capsule Take 1 capsule (0.4 mg total) by mouth daily. 9/79/48  Yes Leighton Ruff, MD     Vital Signs: BP (!) 145/93 (BP Location: Left Arm)   Pulse (!) 59   Temp 98.6 F (37 C)   Resp 18   Ht 5' 6"  (1.676 m)   Wt 225 lb 6.4 oz (102.2 kg)   SpO2 98%   BMI 36.38 kg/m   Physical Exam awake, alert.  Chest clear to auscultation bilaterally.  Heart with regular rate and rhythm.  Abdomen soft, positive bowel sounds, nontender.  Trace bilateral lower extremity edema  Imaging: Dg Bone Survey Met  Result Date: 06/10/2018 CLINICAL DATA:  Multiple myeloma EXAM: METASTATIC BONE SURVEY COMPARISON:  None. FINDINGS: Metastatic bone survey was performed. 1. Cervical Spine: Degenerative disc disease C3 through C7. No lytic abnormality. 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 2. Both shoulders: Mild osteoarthritis of the acromioclavicular and glenohumeral joints bilaterally. No lytic abnormality or fracture. No joint dislocation. 3. Humeri: No lytic abnormality. Mild degenerative subcortical cystic change of the right humeral head adjacent to the greater tuberosity. 4. Bilateral forearms: No lytic abnormality of the radius or ulna on either side. Mild degenerative cystic change of the left scaphoid. 5. Lateral skull: Negative aggressive osteolytic lesions. 6. Pain and lateral thoracic spine: Multilevel degenerative disc disease and endplate spurring. Patchy ill-defined appearance of the mid to lower thoracic vertebral bodies. Cannot exclude small lytic foci. Dedicated CT of the thoracic spine may help for better assessment. 7. Lumbar spine: Mild degenerative change with disc flattening and endplate spurring. No  aggressive lytic abnormalities. 8. Chest: Cardiomegaly. Aortic atherosclerosis. Clear lungs. No definite lytic abnormality of the visualized ribs. 9. AP pelvis: No fracture nor aggressive lytic abnormality noted. 10. Femora: Negative for lytic abnormality. 11. Tibia and fibula: Negative bilaterally lytic abnormality. IMPRESSION: Slightly patchy ill-defined appearance of the thoracic spine some which is due to overlap of adjacent ribs and lung limiting assessment. The possibility of subtle lytic abnormalities is not excluded as a result. CT may help for better assessment. Otherwise, no aggressive nor suspicious lytic abnormalities are identified. Electronically Signed   By: Ashley Royalty M.D.   On: 06/10/2018 23:57    Labs:  CBC: Recent Labs    06/08/18 0553 06/09/18 1019 06/10/18 0337 06/10/18 1605 06/11/18 0324  WBC 6.6 6.5 7.5  --  7.4  HGB 7.6* 7.2* 6.8* 8.1* 8.3*  HCT 24.1* 23.0* 21.8* 26.0* 25.4*  PLT 295 267 341  --  383    COAGS: No results for input(s): INR, APTT in the last 8760  hours.  BMP: Recent Labs    06/08/18 0553 06/09/18 1019 06/10/18 0337 06/11/18 0324  NA 136 139 138 138  K 3.5 3.6 3.6 3.6  CL 105 105 105 103  CO2 22 20* 22 22  GLUCOSE 101* 140* 93 91  BUN 66* 64* 63* 60*  CALCIUM 8.2* 8.5* 8.2* 8.5*  CREATININE 5.91* 5.91* 5.77* 5.50*  GFRNONAA 8* 8* 9* 9*  GFRAA 10* 10* 10* 11*    LIVER FUNCTION TESTS: Recent Labs    05/30/18 0947 06/05/18 0258 06/08/18 0553 06/10/18 0337 06/11/18 0324  BILITOT 0.8 1.7*  --   --   --   AST 21 26  --   --   --   ALT 21 18  --   --   --   ALKPHOS 78 78  --   --   --   PROT 7.2 6.8  --   --   --   ALBUMIN 3.6 3.2* 2.9* 2.8* 3.0*    Assessment and Plan: Pt with history of coronary artery disease, COPD, hypertension, PAF not on anticoagulation and recent right hemicolectomy for cecal mass with pathology yielding adenocarcinoma.  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  He has undergone prior bone marrow biopsy in 2016 at outside facility which revealed plasma cell dyscrasia.  Patient claims he never followed up with results or received any treatment.  He now presents with persistently elevated kappa free light chains with mildly positive ANCA serology.  Request received for CT-guided bone marrow biopsy for further evaluation.Risks and benefits discussed with the patient including, but not limited to bleeding, infection, damage to adjacent structures or low yield requiring additional tests.  All of the patient's questions were answered, patient is agreeable to proceed. Consent signed and in chart.  Procedure tent scheduled for 10/11. Will hold heparin injections until after biopsy.   Electronically Signed: D. Rowe Robert, PA-C 06/11/2018, 9:42 AM   I spent a total of 20 minutes at the the patient's bedside AND on the patient's hospital floor or unit, greater than 50% of which was counseling/coordinating care for CT guided bone marrow biopsy    Patient ID: [name] [name], male   DOB: [birth year], 72 y.o.   MRN: [id number]

## 2018-06-11 NOTE — Progress Notes (Signed)
Admit: 06/05/2018 LOS: 6  76M with AoCKD3 (BL 1.5-2.0) likely from acinetobacter uti+sepsis and/or obstructive uropathy  Subjective:   . Seen by hematology, to have bone marrow biopsy . Serum creatinine continues to slowly improve, 5.5 today, BUN 60. [name] [name] Kitchen Foley taken out this morning . 3.5 L urine output yesterday   10/09 0701 - 10/10 0700 In: 47 [P.O.:500; I.V.:100; Blood:242] Out: 8413 [Urine:3480]  Filed Weights   06/05/18 1251 06/09/18 0500 06/10/18 0506  Weight: 99.8 kg 103.4 kg 102.2 kg    Scheduled Meds: . sodium chloride   Intravenous Once  . amLODipine  5 mg Oral Daily  . finasteride  5 mg Oral Daily  . [START ON 06/12/2018] heparin  5,000 Units Subcutaneous Q8H  . tamsulosin  0.4 mg Oral QHS   Continuous Infusions: . sodium chloride Stopped (06/10/18 0420)  . ampicillin-sulbactam (UNASYN) IV 3 g (06/11/18 0119)   PRN Meds:.acetaminophen **OR** acetaminophen, albuterol, HYDROcodone-acetaminophen, metoprolol tartrate, ondansetron **OR** ondansetron (ZOFRAN) IV, senna-docusate  Current Labs: reviewed  SPEP + SFLC pending C3, C4, ANAI, ANCA, Hep Panel pending  Physical Exam:  Blood pressure (!) 145/93, pulse (!) 59, temperature 98.6 F (37 C), resp. rate 18, height 5' 6"  (1.676 m), weight 102.2 kg, SpO2 98 %. NAD, lying in bed  RRR nl s1s2 CtAB Trace Eemea No rashes/ Nonfocal, CN2-12 intace EOMI NCAT  A 1. AoCKD3 likely from uti+ sepsps and/or obstruction req foley, now UOP excellent and SCr sslightly improved. Serologies surprisingly with abnormal light chains, elevated Kappa, and weakly increased PR3 ANCA.    2. Abnormal SFLC; elevated Kappa level -- heme following, for BM Bx 3. Weakly positive PR3 with hematuria 4. BPH with retention, on flomax/proscar, with foley 5. Acinetobacter bacteremia and UTI: on unasyn, ID following 6. Anemia, transfuse 1u 10/9 planned 7. CAD  8. 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 Working [name] [name] has been that patient's infection and obstruction primary explained his acute kidney injury; however he clearly has a monoclonal process and his PR-3 ANCA is weakly positive with hematuria present on the 3 most recent urine analysis.  Given his anemia and ongoing therapy for Acinetobacter bacteremia and UTI he is not currently a candidate for renal biopsy.  As he stabilizes this will need to be reconsidered.  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 . With recent infection I would not immediately treat with plasmapheresis, await further work-up; further, his renal function is with some improvement but he does have persistent low GFR . Follow urine output after Foley removed . Daily weights, Daily Renal Panel, Strict I/Os, Avoid nephrotoxins (NSAIDs, judicious IV Contrast)   [name] Grippe MD 06/11/2018, 11:56 AM  Recent Labs  Lab 06/08/18 0553 06/09/18 1019 06/10/18 0337 06/11/18 0324  NA 136 139 138 138  K 3.5 3.6 3.6 3.6  CL 105 105 105 103  CO2 22 20* 22 22  GLUCOSE 101* 140* 93 91  BUN 66* 64* 63* 60*  CREATININE 5.91* 5.91* 5.77* 5.50*  CALCIUM 8.2* 8.5* 8.2* 8.5*  PHOS 4.1  --  5.0* 5.0*   Recent Labs  Lab 06/05/18 0258  06/09/18 1019 06/10/18 0337 06/10/18 1605 06/11/18 0324  WBC 13.4*   < > 6.5 7.5  --  7.4  NEUTROABS 12.5*  --   --   --   --   --   HGB 8.3*   < > 7.2* 6.8* 8.1* 8.3*  HCT  25.8*   < > 23.0* 21.8* 26.0* 25.4*  MCV 98.1   < > 100.4* 98.6  --  97.3  PLT 302   < > 267 341  --  383   < > = values in this interval not displayed.

## 2018-06-12 ENCOUNTER — Inpatient Hospital Stay (HOSPITAL_COMMUNITY): Payer: Medicare Other

## 2018-06-12 DIAGNOSIS — N179 Acute kidney failure, unspecified: Secondary | ICD-10-CM

## 2018-06-12 DIAGNOSIS — D649 Anemia, unspecified: Secondary | ICD-10-CM

## 2018-06-12 LAB — CBC WITH DIFFERENTIAL/PLATELET
Abs Immature Granulocytes: 0.05 10*3/uL (ref 0.00–0.07)
Basophils Absolute: 0 10*3/uL (ref 0.0–0.1)
Basophils Relative: 0 %
EOS PCT: 2 %
Eosinophils Absolute: 0.1 10*3/uL (ref 0.0–0.5)
HEMATOCRIT: 23.7 % — AB (ref 39.0–52.0)
HEMOGLOBIN: 7.4 g/dL — AB (ref 13.0–17.0)
Immature Granulocytes: 1 %
LYMPHS ABS: 1.5 10*3/uL (ref 0.7–4.0)
LYMPHS PCT: 24 %
MCH: 30.2 pg (ref 26.0–34.0)
MCHC: 31.2 g/dL (ref 30.0–36.0)
MCV: 96.7 fL (ref 80.0–100.0)
MONO ABS: 0.7 10*3/uL (ref 0.1–1.0)
Monocytes Relative: 10 %
Neutro Abs: 4 10*3/uL (ref 1.7–7.7)
Neutrophils Relative %: 63 %
Platelets: 418 10*3/uL — ABNORMAL HIGH (ref 150–400)
RBC: 2.45 MIL/uL — ABNORMAL LOW (ref 4.22–5.81)
RDW: 12.8 % (ref 11.5–15.5)
WBC: 6.4 10*3/uL (ref 4.0–10.5)
nRBC: 0 % (ref 0.0–0.2)

## 2018-06-12 LAB — RENAL FUNCTION PANEL
Albumin: 3.1 g/dL — ABNORMAL LOW (ref 3.5–5.0)
Anion gap: 14 (ref 5–15)
BUN: 62 mg/dL — AB (ref 8–23)
CHLORIDE: 101 mmol/L (ref 98–111)
CO2: 23 mmol/L (ref 22–32)
Calcium: 8.5 mg/dL — ABNORMAL LOW (ref 8.9–10.3)
Creatinine, Ser: 5.22 mg/dL — ABNORMAL HIGH (ref 0.61–1.24)
GFR calc Af Amer: 11 mL/min — ABNORMAL LOW (ref >60)
GFR, EST NON AFRICAN AMERICAN: 10 mL/min — AB (ref >60)
Glucose, Bld: 88 mg/dL (ref 70–99)
POTASSIUM: 3.4 mmol/L — AB (ref 3.5–5.1)
Phosphorus: 5.2 mg/dL — ABNORMAL HIGH (ref 2.5–4.6)
Sodium: 138 mmol/L (ref 135–145)

## 2018-06-12 LAB — PREPARE RBC (CROSSMATCH)

## 2018-06-12 LAB — GLUCOSE, CAPILLARY
Glucose-Capillary: 86 mg/dL (ref 70–99)
Glucose-Capillary: 84 mg/dL (ref 70–99)
Glucose-Capillary: 93 mg/dL (ref 70–99)

## 2018-06-12 LAB — PROTIME-INR
INR: 1.06
Prothrombin Time: 13.8 seconds (ref 11.4–15.2)

## 2018-06-12 LAB — SEDIMENTATION RATE: SED RATE: 104 mm/h — AB (ref 0–16)

## 2018-06-12 LAB — MINIMUM INHIBITORY CONC. (1 DRUG)

## 2018-06-12 LAB — MIC RESULT

## 2018-06-12 LAB — LACTATE DEHYDROGENASE: LDH: 192 U/L (ref 98–192)

## 2018-06-12 MED ORDER — MIDAZOLAM HCL 2 MG/2ML IJ SOLN
INTRAMUSCULAR | Status: AC
  Filled 2018-06-12: qty 4

## 2018-06-12 MED ORDER — ACETAMINOPHEN 325 MG PO TABS
650.0000 mg | ORAL_TABLET | Freq: Once | ORAL | Status: AC
  Administered 2018-06-12: 650 mg via ORAL
  Filled 2018-06-12: qty 2

## 2018-06-12 MED ORDER — DIPHENHYDRAMINE HCL 50 MG/ML IJ SOLN
25.0000 mg | Freq: Once | INTRAMUSCULAR | Status: AC
  Administered 2018-06-12: 25 mg via INTRAVENOUS
  Filled 2018-06-12: qty 1

## 2018-06-12 MED ORDER — LIDOCAINE HCL 1 % IJ SOLN
INTRAMUSCULAR | Status: AC
  Administered 2018-06-12: 10:00:00
  Filled 2018-06-12: qty 20

## 2018-06-12 MED ORDER — MIDAZOLAM HCL 2 MG/2ML IJ SOLN
INTRAMUSCULAR | Status: AC | PRN
  Administered 2018-06-12: 0.5 mg via INTRAVENOUS
  Administered 2018-06-12: 10:00:00 via INTRAVENOUS

## 2018-06-12 MED ORDER — DEXTROSE 5 % AND 0.9 % NACL IV BOLUS
100.0000 mL | Freq: Once | INTRAVENOUS | Status: AC
  Administered 2018-06-12: 05:00:00 via INTRAVENOUS

## 2018-06-12 MED ORDER — FENTANYL CITRATE (PF) 100 MCG/2ML IJ SOLN
INTRAMUSCULAR | Status: AC | PRN
  Administered 2018-06-12: 10:00:00 via INTRAVENOUS
  Administered 2018-06-12: 25 ug via INTRAVENOUS

## 2018-06-12 MED ORDER — SODIUM CHLORIDE 0.9% IV SOLUTION
Freq: Once | INTRAVENOUS | Status: AC
  Administered 2018-06-12: 16:00:00 via INTRAVENOUS

## 2018-06-12 MED ORDER — DEXAMETHASONE 4 MG PO TABS
20.0000 mg | ORAL_TABLET | Freq: Every day | ORAL | Status: AC
  Administered 2018-06-13 – 2018-06-16 (×4): 20 mg via ORAL
  Filled 2018-06-12 (×4): qty 5

## 2018-06-12 MED ORDER — FENTANYL CITRATE (PF) 100 MCG/2ML IJ SOLN
INTRAMUSCULAR | Status: AC
  Filled 2018-06-12: qty 4

## 2018-06-12 NOTE — Progress Notes (Signed)
Pt refuses iv antibiotics and he states he is going to leave now.  He is going to call his son to come and get him.

## 2018-06-12 NOTE — Progress Notes (Signed)
Physical Therapy Treatment Patient Details Name: [name] [name] MRN: [id number] DOB: 06-Aug-1946 Today's Date: 06/12/2018    History of Present Illness  [name] [name]  is a 72 y.o. male, past medical history of CAD, hypertension, hyperlipidemia, A. 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 Pt found to have soft BP, tachycardia, UTI, and worsening renal function in ED.     PT Comments    Pt with much improved tolerance for mobility this session. Able to perform dynamic balance tasks during gait with only mild unsteadiness noted. Min guard to supervision for mobility without AD. Feel pt would benefit from outpatient PT given improvement in mobility, therefore updated recommendations. Will continue to follow acutely to maximize functional mobility independence and safety.    Follow Up Recommendations  Outpatient PT;Supervision for mobility/OOB     Equipment Recommendations  None recommended by PT    Recommendations for Other Services       Precautions / Restrictions Precautions Precautions: Fall Precaution Comments: pt has been having some dizziness Restrictions Weight Bearing Restrictions: No    Mobility  Bed Mobility Overal bed mobility: Modified Independent                Transfers Overall transfer level: Needs assistance Equipment used: None Transfers: Sit to/from Stand Sit to Stand: Supervision         General transfer comment: supervision for safety.   Ambulation/Gait Ambulation/Gait assistance: Supervision;Min guard Gait Distance (Feet): 400 Feet Assistive device: None Gait  Pattern/deviations: Step-through pattern;Decreased stride length Gait velocity: decreased   General Gait Details: Much improved tolerance for gait. Performed the following higher level balance tasks during gait: horizontal/vertical head turns, stepping over obstacles, weaving around obstacles. Pt with mild unsteadiness requiring occasional min guard, however, overall tolerated well.    Stairs             Wheelchair Mobility    Modified Rankin (Stroke Patients Only)       Balance Overall balance assessment: Needs assistance Sitting-balance support: No upper extremity supported;Feet supported Sitting balance-Leahy Scale: Good     Standing balance support: No upper extremity supported;During functional activity Standing balance-Leahy Scale: Fair                              Cognition Arousal/Alertness: Awake/alert Behavior During Therapy: WFL for tasks assessed/performed Overall Cognitive Status: Within Functional Limits for tasks assessed                                        Exercises      General Comments General comments (skin integrity, edema, etc.): Pt reporting improvement in spirits this session and improvement in mobility.       Pertinent Vitals/Pain Pain Assessment: No/denies pain    Home Living                      Prior Function            PT Goals (current goals can now be found in the care plan section) Acute Rehab  PT Goals Patient Stated Goal: feel better PT Goal Formulation: With patient Time For Goal Achievement: 06/20/18 Potential to Achieve Goals: Good Progress towards PT goals: Progressing toward goals    Frequency    Min 3X/week      PT Plan Discharge plan needs to be updated    Co-evaluation              AM-PAC PT "6 Clicks" Daily Activity  Outcome Measure  Difficulty turning over in bed (including adjusting bedclothes, sheets and blankets)?: None Difficulty moving from lying  on back to sitting on the side of the bed? : A Little Difficulty sitting down on and standing up from a chair with arms (e.g., wheelchair, bedside commode, etc,.)?: A Little Help needed moving to and from a bed to chair (including a wheelchair)?: A Little Help needed walking in hospital room?: A Little Help needed climbing 3-5 steps with a railing? : A Little 6 Click Score: 19    End of Session   Activity Tolerance: Patient tolerated treatment well Patient left: in bed;with call bell/phone within reach;with bed alarm set Nurse Communication: Mobility status PT Visit Diagnosis: Dizziness and giddiness (R42);Other abnormalities of gait and mobility (R26.89)     Time: 1448-1856 PT Time Calculation (min) (ACUTE ONLY): 21 min  Charges:  $Gait Training: 8-22 mins                     [name] [name], PT, DPT  Acute Rehabilitation Services  Pager: [phone] Office: [phone]    [name] [name] 06/12/2018, 5:54 PM

## 2018-06-12 NOTE — Progress Notes (Addendum)
PROGRESS NOTE        PATIENT DETAILS Name: [name] [name] Age: 72 y.o. Sex: male Date of Birth: 08-02-1946 Admit Date: 06/05/2018 Admitting Physician Albertine Patricia, MD DGL:[id number], Christean Grief, MD  Brief Narrative: Patient is a 72 y.o. male with history of CAD, hypertension, PAF not on anticoagulation-recent right hemicolectomy for a cecal mass-just discharged by the general surgical service on 2/95-JOACZYSA course was complicated by development of obstructive uropathy and AKI ( discharged with a Foley catheter, successful outpatient voiding trial on 10/1)-presented to the hospital with sepsis secondary to Acinetobacter bacteremia, acute kidney injury.  Further work-up demonstrates significantly elevated serum kappa light chains, mildly positive ANCA serology.  See below for further details  Subjective: No major events overnight-Foley catheter removed on 10/9-claims to be voiding without major issues.  Underwent bone marrow biopsy earlier today.  Assessment/Plan: Sepsis secondary to Acinetobacter bacteremia along with Acinetobacter and enterococcal UTI: Secondary to recent hospitalization for laparotomy/recent Foley catheter placement-sepsis pathophysiology has resolved-remains on IV Unasyn-stop date of 10/12.  Repeat blood cultures on 10/9- so far.  AKI on CKD stage III: Initially thought to have hemodynamically mediated kidney injury with some contribution from obstructive uropathy.  However with possibility of light chain myeloma-concern for myeloma kidney as well.  Creatinine slowly downtrending-although ANCA serology weakly positive-not felt to be a good candidate for renal biopsy given bacteremia, and possibility of renal cell carcinoma as well.  Nephrology continues to follow-if he indeed does have bone marrow biopsy confirmed light chain myeloma-and continues to have significant amount of renal function-may need plasmapheresis-or even initiation of chemotherapy sooner  than later.  Foley catheter was discontinued on 10/9-seems to be voiding appropriately.  Follow.  Elevated free kappa light chains: Found to significantly elevated free kappa light chains-as part of routine work-up for anemia/renal failure.  Underwent bone marrow biopsy on 10/11.  Bone survey does not show any concerning lesions.  Will discuss with hematology-regards to further plans.    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  Hemoglobin continues to downtrend-oncology recommending transfusion if hemoglobin less than 8-hence will transfuse 1 more unit of PRBC (1 unit transfused on 10/9.)  Hypokalemia: Replete and recheck.  BPH with recent urinary retention/obstructive uropathy requiring Foley catheter placement: Foley catheter was inserted on 10/5 due to worsening renal function and concern for obstructive uropathy contributing to AKI-with improving renal function-and leaking Foley catheter-this catheter was discontinued on 10/9.  Continue to monitor closely-May need frequent residual volume check.  Continue Flomax and Proscar.   Chronic diastolic heart failure: Remains euvolemic-follow volume status closely.  Hypertension: BP controlled-continue amlodipine.  CAD: No anginal symptoms-follow.  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  DVT Prophylaxis: Prophylactic Heparin   Code Status: Full code  Family Communication: Daughter over the phone-son at bedside.  Disposition Plan: Remain inpatient-probably require several more days of hospitalization.  He continues to express desire to leave the hospital-after he spoke with his son today-he will continue to remain inpatient.  Antimicrobial agents: Anti-infectives (From admission, onward)   Start     Dose/Rate Route Frequency Ordered Stop   06/08/18  1200  Ampicillin-Sulbactam (UNASYN) 3 g in sodium chloride 0.9 % 100 mL IVPB     3 g 200 mL/hr over 30 Minutes  Intravenous Every 12 hours 06/08/18 1051 06/13/18 2359   06/08/18 1100  Ampicillin-Sulbactam (UNASYN) 3 g in sodium chloride 0.9 % 100 mL IVPB  Status:  Discontinued     3 g 200 mL/hr over 30 Minutes Intravenous Every 24 hours 06/08/18 1049 06/08/18 1051   06/08/18 1000  meropenem (MERREM) 1 g in sodium chloride 0.9 % 100 mL IVPB  Status:  Discontinued     1 g 200 mL/hr over 30 Minutes Intravenous Every 24 hours 06/07/18 1303 06/08/18 1026   06/06/18 1345  meropenem (MERREM) 1 g in sodium chloride 0.9 % 100 mL IVPB  Status:  Discontinued     1 g 200 mL/hr over 30 Minutes Intravenous Every 12 hours 06/06/18 1333 06/07/18 1303   06/06/18 0800  ceFEPIme (MAXIPIME) 1 g in sodium chloride 0.9 % 100 mL IVPB  Status:  Discontinued     1 g 200 mL/hr over 30 Minutes Intravenous Every 24 hours 06/05/18 1056 06/06/18 1333   06/05/18 0900  vancomycin (VANCOCIN) 2,000 mg in sodium chloride 0.9 % 500 mL IVPB     2,000 mg 250 mL/hr over 120 Minutes Intravenous  Once 06/05/18 0816 06/05/18 1149   06/05/18 0730  ceFEPIme (MAXIPIME) 2 g in sodium chloride 0.9 % 100 mL IVPB     2 g 200 mL/hr over 30 Minutes Intravenous  Once 06/05/18 0720 06/05/18 0833   06/05/18 0730  metroNIDAZOLE (FLAGYL) IVPB 500 mg  Status:  Discontinued     500 mg 100 mL/hr over 60 Minutes Intravenous Every 8 hours 06/05/18 0720 06/05/18 1035   06/05/18 0730  vancomycin (VANCOCIN) IVPB 1000 mg/200 mL premix  Status:  Discontinued     1,000 mg 200 mL/hr over 60 Minutes Intravenous  Once 06/05/18 0720 06/05/18 0816      Procedures: None  CONSULTS:  None  Time spent: 25 minutes-Greater than 50% of this time was spent in counseling, explanation of diagnosis, planning of further management, and coordination of care.  MEDICATIONS: Scheduled Meds: . sodium chloride   Intravenous Once  . amLODipine  5 mg Oral Daily  .  fentaNYL      . finasteride  5 mg Oral Daily  . heparin  5,000 Units Subcutaneous Q8H  . lidocaine      . midazolam      . tamsulosin  0.4 mg Oral QHS   Continuous Infusions: . sodium chloride Stopped (06/10/18 0420)  . ampicillin-sulbactam (UNASYN) IV 3 g (06/12/18 1155)   PRN Meds:.acetaminophen **OR** acetaminophen, albuterol, HYDROcodone-acetaminophen, metoprolol tartrate, ondansetron **OR** ondansetron (ZOFRAN) IV, senna-docusate, sodium chloride   PHYSICAL EXAM: Vital signs: Vitals:   06/12/18 1010 06/12/18 1015 06/12/18 1020 06/12/18 1025  BP: (!) 171/87 (!) 163/88 (!) 153/99 (!) 145/89  Pulse: (!) 53 (!) 50 (!) 52 (!) 54  Resp: 15 18 14 15   Temp:      TempSrc:      SpO2: 100% 100% 100% 99%  Weight:      Height:       Filed Weights   06/09/18 0500 06/10/18 0506 06/11/18 1700  Weight: 103.4 kg 102.2 kg 101.4 kg   Body mass index is 36.08 kg/m.   General appearance:Awake, alert, not in any distress.  Eyes:no scleral icterus. HEENT: Atraumatic and Normocephalic Neck: supple, no JVD. Resp:Good air entry bilaterally,no rales or rhonchi CVS: S1 S2 regular, no murmurs.  GI: Bowel sounds present, Non tender and not distended with no gaurding, rigidity or rebound. Extremities: B/L Lower Ext shows no  edema, both legs are warm to touch Neurology:  Non focal Psychiatric: Normal judgment and insight. Normal mood. Musculoskeletal:No digital cyanosis Skin:No Rash, warm and dry Wounds:N/A  I have personally reviewed following labs and imaging studies  LABORATORY DATA: CBC: Recent Labs  Lab 06/08/18 0553 06/09/18 1019 06/10/18 0337 06/10/18 1605 06/11/18 0324 06/12/18 0408  WBC 6.6 6.5 7.5  --  7.4 6.4  NEUTROABS  --   --   --   --   --  4.0  HGB 7.6* 7.2* 6.8* 8.1* 8.3* 7.4*  HCT 24.1* 23.0* 21.8* 26.0* 25.4* 23.7*  MCV 99.2 100.4* 98.6  --  97.3 96.7  PLT 295 267 341  --  383 418*    Basic Metabolic Panel: Recent Labs  Lab 06/08/18 0553 06/09/18 1019  06/10/18 0337 06/11/18 0324 06/12/18 0408  NA 136 139 138 138 138  K 3.5 3.6 3.6 3.6 3.4*  CL 105 105 105 103 101  CO2 22 20* 22 22 23   GLUCOSE 101* 140* 93 91 88  BUN 66* 64* 63* 60* 62*  CREATININE 5.91* 5.91* 5.77* 5.50* 5.22*  CALCIUM 8.2* 8.5* 8.2* 8.5* 8.5*  PHOS 4.1  --  5.0* 5.0* 5.2*    GFR: Estimated Creatinine Clearance: 13.8 mL/min (A) (by C-G formula based on SCr of 5.22 mg/dL (H)).  Liver Function Tests: Recent Labs  Lab 06/08/18 0553 06/10/18 0337 06/11/18 0324 06/12/18 0408  ALBUMIN 2.9* 2.8* 3.0* 3.1*   No results for input(s): LIPASE, AMYLASE in the last 168 hours. No results for input(s): AMMONIA in the last 168 hours.  Coagulation Profile: Recent Labs  Lab 06/12/18 0408  INR 1.06    Cardiac Enzymes: No results for input(s): CKTOTAL, CKMB, CKMBINDEX, TROPONINI in the last 168 hours.  BNP (last 3 results) No results for input(s): PROBNP in the last 8760 hours.  HbA1C: No results for input(s): HGBA1C in the last 72 hours.  CBG: Recent Labs  Lab 06/09/18 1134 06/09/18 1652 06/10/18 0852 06/12/18 0432 06/12/18 0819  GLUCAP 116* 124* 87 86 84    Lipid Profile: No results for input(s): CHOL, HDL, LDLCALC, TRIG, CHOLHDL, LDLDIRECT in the last 72 hours.  Thyroid Function Tests: No results for input(s): TSH, T4TOTAL, FREET4, T3FREE, THYROIDAB in the last 72 hours.  Anemia Panel: No results for input(s): VITAMINB12, FOLATE, FERRITIN, TIBC, IRON, RETICCTPCT in the last 72 hours.  Urine analysis:    Component Value Date/Time   COLORURINE STRAW (A) 06/07/2018 1643   APPEARANCEUR CLEAR 06/07/2018 1643   LABSPEC 1.008 06/07/2018 1643   PHURINE 6.0 06/07/2018 1643   GLUCOSEU NEGATIVE 06/07/2018 1643   HGBUR LARGE (A) 06/07/2018 1643   BILIRUBINUR NEGATIVE 06/07/2018 1643   KETONESUR NEGATIVE 06/07/2018 1643   PROTEINUR 30 (A) 06/07/2018 1643   NITRITE NEGATIVE 06/07/2018 1643   LEUKOCYTESUR MODERATE (A) 06/07/2018 1643    Sepsis  Labs: Lactic Acid, Venous    Component Value Date/Time   LATICACIDVEN 1.63 06/05/2018 0310    MICROBIOLOGY: Recent Results (from the past 240 hour(s))  Blood culture (routine x 2)     Status: Abnormal (Preliminary result)   Collection Time: 06/05/18  2:52 AM  Result Value Ref Range Status   Specimen Description BLOOD LEFT HAND  Final   Special Requests   Final    BOTTLES DRAWN AEROBIC AND ANAEROBIC Blood Culture adequate volume   Culture  Setup Time   Final    IN BOTH AEROBIC AND ANAEROBIC BOTTLES GRAM NEGATIVE RODS CRITICAL RESULT CALLED TO,  READ BACK BY AND VERIFIED WITH: L SEAY PHARMD 06/06/18 1225 JDW    Culture (A)  Final    ACINETOBACTER CALCOACETICUS/BAUMANNII COMPLEX Sent to [facility] for further susceptibility testing. Performed at [facility], [place] [address], [place], [place] [address]    Report Status PENDING  Incomplete   Organism ID, Bacteria ACINETOBACTER CALCOACETICUS/BAUMANNII COMPLEX  Final      Susceptibility   Acinetobacter calcoaceticus/baumannii complex - MIC*    CEFTAZIDIME 8 SENSITIVE Sensitive     CEFTRIAXONE 16 INTERMEDIATE Intermediate     CIPROFLOXACIN 0.5 SENSITIVE Sensitive     GENTAMICIN 4 SENSITIVE Sensitive     IMIPENEM <=0.25 SENSITIVE Sensitive     PIP/TAZO <=4 SENSITIVE Sensitive     TRIMETH/SULFA <=20 SENSITIVE Sensitive     CEFEPIME 8 SENSITIVE Sensitive     AMPICILLIN/SULBACTAM <=2 SENSITIVE Sensitive     * ACINETOBACTER CALCOACETICUS/BAUMANNII COMPLEX  Blood Culture ID Panel (Reflexed)     Status: None   Collection Time: 06/05/18  2:52 AM  Result Value Ref Range Status   Enterococcus species NOT DETECTED NOT DETECTED Final    Comment: CRITICAL RESULT CALLED TO, READ BACK BY AND VERIFIED WITH: L SEAY PHARMD 06/06/18 1225 JDW    Listeria monocytogenes NOT DETECTED NOT DETECTED Final   Staphylococcus species NOT DETECTED NOT DETECTED Final   Staphylococcus aureus (BCID) NOT DETECTED NOT DETECTED Final   Streptococcus species NOT  DETECTED NOT DETECTED Final   Streptococcus agalactiae NOT DETECTED NOT DETECTED Final   Streptococcus pneumoniae NOT DETECTED NOT DETECTED Final   Streptococcus pyogenes NOT DETECTED NOT DETECTED Final   Acinetobacter baumannii NOT DETECTED NOT DETECTED Final   Enterobacteriaceae species NOT DETECTED NOT DETECTED Final   Enterobacter cloacae complex NOT DETECTED NOT DETECTED Final   Escherichia coli NOT DETECTED NOT DETECTED Final   Klebsiella oxytoca NOT DETECTED NOT DETECTED Final   Klebsiella pneumoniae NOT DETECTED NOT DETECTED Final   Proteus species NOT DETECTED NOT DETECTED Final   Serratia marcescens NOT DETECTED NOT DETECTED Final   Haemophilus influenzae NOT DETECTED NOT DETECTED Final   Neisseria meningitidis NOT DETECTED NOT DETECTED Final   Pseudomonas aeruginosa NOT DETECTED NOT DETECTED Final   Candida albicans NOT DETECTED NOT DETECTED Final   Candida glabrata NOT DETECTED NOT DETECTED Final   Candida krusei NOT DETECTED NOT DETECTED Final   Candida parapsilosis NOT DETECTED NOT DETECTED Final   Candida tropicalis NOT DETECTED NOT DETECTED Final    Comment: Performed at [facility], [place] [address] [place], [place] [address]  Blood culture (routine x 2)     Status: None   Collection Time: 06/05/18  3:00 AM  Result Value Ref Range Status   Specimen Description BLOOD RIGHT ANTECUBITAL  Final   Special Requests   Final    BOTTLES DRAWN AEROBIC AND ANAEROBIC Blood Culture adequate volume   Culture   Final    NO GROWTH 5 DAYS Performed at [facility], [place] [address] [place], [place] [address]    Report Status 06/10/2018 FINAL  Final  Culture, Urine     Status: Abnormal   Collection Time: 06/05/18  8:00 AM  Result Value Ref Range Status   Specimen Description URINE, RANDOM  Final   Special Requests   Final    NONE Performed at [facility], [place] [address] [place], [place] [address]    Culture (A)  Final    >=100,000 COLONIES/mL ENTEROCOCCUS  FAECALIS 10,000 COLONIES/mL ACINETOBACTER  CALCOACETICUS/BAUMANNII COMPLEX    Report Status 06/08/2018 FINAL  Final   Organism ID, Bacteria ENTEROCOCCUS FAECALIS (A)  Final   Organism ID, Bacteria ACINETOBACTER CALCOACETICUS/BAUMANNII COMPLEX (A)  Final      Susceptibility   Acinetobacter calcoaceticus/baumannii complex - MIC*    CEFTAZIDIME 8 SENSITIVE Sensitive     CEFTRIAXONE 16 INTERMEDIATE Intermediate     CIPROFLOXACIN 1 SENSITIVE Sensitive     GENTAMICIN 4 SENSITIVE Sensitive     IMIPENEM <=0.25 SENSITIVE Sensitive     PIP/TAZO >=128 RESISTANT Resistant     TRIMETH/SULFA <=20 SENSITIVE Sensitive     CEFEPIME 8 SENSITIVE Sensitive     AMPICILLIN/SULBACTAM <=2 SENSITIVE Sensitive     * 10,000 COLONIES/mL ACINETOBACTER CALCOACETICUS/BAUMANNII COMPLEX   Enterococcus faecalis - MIC*    AMPICILLIN <=2 SENSITIVE Sensitive     LEVOFLOXACIN 1 SENSITIVE Sensitive     NITROFURANTOIN <=16 SENSITIVE Sensitive     VANCOMYCIN 1 SENSITIVE Sensitive     * >=100,000 COLONIES/mL ENTEROCOCCUS FAECALIS  MRSA PCR Screening     Status: None   Collection Time: 06/05/18  1:04 PM  Result Value Ref Range Status   MRSA by PCR NEGATIVE NEGATIVE Final    Comment:        The GeneXpert MRSA Assay (FDA approved for NASAL specimens only), is one component of a comprehensive MRSA colonization surveillance program. It is not intended to diagnose MRSA infection nor to guide or monitor treatment for MRSA infections. Performed at [facility], [place] [address], [place], [place] [address]   Culture, blood (routine x 2)     Status: None (Preliminary result)   Collection Time: 06/10/18  4:50 PM  Result Value Ref Range Status   Specimen Description BLOOD LEFT HAND  Final   Special Requests   Final    BOTTLES DRAWN AEROBIC ONLY Blood Culture adequate volume   Culture   Final    NO GROWTH 2 DAYS Performed at [facility], [place] [address], [place], [place] [address]    Report Status PENDING   Incomplete  Culture, blood (routine x 2)     Status: None (Preliminary result)   Collection Time: 06/10/18  5:00 PM  Result Value Ref Range Status   Specimen Description BLOOD RIGHT ANTECUBITAL  Final   Special Requests   Final    BOTTLES DRAWN AEROBIC ONLY Blood Culture adequate volume   Culture   Final    NO GROWTH 2 DAYS Performed at [facility], [place] [address], [place],  [address]    Report Status PENDING  Incomplete    RADIOLOGY STUDIES/RESULTS: Ct Abdomen Pelvis Wo Contrast  Result Date: 06/05/2018 CLINICAL DATA:  Abdominal pain, fever.  Recent partial colectomy. EXAM: CT ABDOMEN AND PELVIS WITHOUT CONTRAST TECHNIQUE: Multidetector CT imaging of the abdomen and pelvis was performed following the standard protocol without IV contrast. COMPARISON:  05/30/2018 FINDINGS: Lower chest: Cardiomegaly.  Bibasilar atelectasis.  No effusions. Hepatobiliary: Small scattered hepatic cysts, small scattered hepatic low-density lesions throughout the liver, likely cysts although these cannot be characterized fully without intravenous contrast. Gallbladder unremarkable. Pancreas: No focal abnormality or ductal dilatation. Spleen: No focal abnormality.  Normal size. Adrenals/Urinary Tract: Numerous low-density, mixed density and high-density masses throughout the kidney are stable since recent study and cannot be characterized fully without intravenous contrast. At least 2 of these masses were shown on prior contrast enhanced CT to be suspicious for renal cell carcinoma. No hydronephrosis. Adrenal glands unremarkable. Urinary bladder decompressed  and mildly thick walled, likely related to prostate enlargement. Stomach/Bowel: Descending colonic and sigmoid diverticulosis. Postoperative changes in the right colon from right hemicolectomy, stable. Again seen is the ventral hernia containing a portion of the mid transverse colon which appears unremarkable. No evidence of obstruction or bowel wall  thickening. Stomach and small bowel grossly unremarkable. No evidence of bowel obstruction. Vascular/Lymphatic: Aortic atherosclerosis. No enlarged abdominal or pelvic lymph nodes. Reproductive: Marked enlargement of the prostate. Other: No free fluid or free air. Continued wall thickening within the periphery of the midline supraumbilical ventral hernia which also contains the transverse colon. Again, no underlying bowel wall thickening. This has a stable appearance since prior study. Musculoskeletal: No acute bony abnormality. Diffuse degenerative changes in the lumbar spine. IMPRESSION: Stable appearance of prior right hemicolectomy. Stable supraumbilical ventral hernia containing a portion of the transverse colon. The wall of the ventral hernia again appears thickened and indistinct, but underlying bowel does not appear abnormal. Scattered diverticulosis.  No active diverticulitis. Marked prostate enlargement with bladder wall thickening. Multiple renal lesions. At least 2 of which were shown on prior contrast-enhanced CT to be suspicious for renal cell carcinoma. No change. Scattered hepatic low-density lesions cannot be characterized without intravenous contrast, likely cysts. Cardiomegaly, bibasilar atelectasis. Aortic atherosclerosis. Electronically Signed   By: Rolm Baptise M.D.   On: 06/05/2018 08:22   Ct Abdomen Pelvis Wo Contrast  Result Date: 05/30/2018 CLINICAL DATA:  Partial right colectomy, perform laparoscopically. EXAM: CT ABDOMEN AND PELVIS WITHOUT CONTRAST TECHNIQUE: Multidetector CT imaging of the abdomen and pelvis was performed following the standard protocol without IV contrast. COMPARISON:  March 24, 2018 FINDINGS: Lower chest: Cardiomegaly.  No other abnormalities. Hepatobiliary: Multiple low-attenuation lesions in the liver are most likely cysts, unchanged. The liver and gallbladder are otherwise unremarkable. Pancreas: Unremarkable. No pancreatic ductal dilatation or surrounding  inflammatory changes. Spleen: Normal in size without focal abnormality. Adrenals/Urinary Tract: Adrenal glands are normal. A 4.1 cm apparently solid mass is seen in the upper pole of the left kidney measuring 3.9 cm previously. A 2.7 cm mass in the medial left kidney on series 2, image 28 is essentially unchanged and nonspecific. Several renal cysts are noted. There is a solid and cystic appearing mass in the lower pole of the right kidney difficult to compare to the previous study due to lack of contrast today. However, the maximum measurement is approximately 4.6 cm, unchanged. Several probable hyperdense cysts are noted. The bladder is decompressed with a Foley catheter. There is mild fat stranding adjacent to the bladder which is a new finding. No renal stones or hydronephrosis. No ureteral stones. Stomach/Bowel: The stomach and small bowel are normal. The patient is status post partial right colectomy scattered colonic diverticuli are noted. There is a new ventral hernia, located superior to the previously identified umbilical hernia. This is best seen on series 2, image 35. There is fat stranding within the hernia which likely contains omentum. It also contains a portion of transverse colon which does not appear to be inflamed. The wall of the hernia is thickened and inflamed. Surgical changes are seen in the right lower abdomen at the site of right colectomy. There is some increased attenuation in the surrounding fat, likely postoperative. No collection to suggest abscess or leak. The appendix is been removed along with the right partial colectomy. Vascular/Lymphatic: Mild atherosclerotic changes in the aorta. No obvious adenopathy. Reproductive: Again noted is an enlarged prostate exerting mass effect on the posterior bladder. Other: No abdominal  wall hernia or abnormality. No abdominopelvic ascites. Musculoskeletal: No acute or significant osseous findings. IMPRESSION: 1. At least 2 renal masses concerning  for renal cell carcinoma. The masses were better assessed with a previous contrast-enhanced study. Recommend an MRI when the patient is able. 2. There is a new ventral hernia. The periphery of the hernia/hernia wall is thickened and inflamed. The hernia contains transverse colon which does not appear to be inflamed or obstructed. There is also likely some herniated omentum with associated fat stranding. This hernia is new compared to March 24, 2018. No associated bowel obstruction. 3. Inflammatory changes in the right lower quadrant, surrounding the site of right partial colectomy. These findings are likely postsurgical as surgery was only just over 1 week ago. No evidence of leak or abscess. 4. Atherosclerotic changes in the abdominal aorta. Electronically Signed   By: Dorise Bullion III M.D   On: 05/30/2018 12:24   Dg Chest 2 View  Result Date: 06/05/2018 CLINICAL DATA:  Shortness of breath. EXAM: CHEST - 2 VIEW COMPARISON:  Chest CT 05/19/2018 FINDINGS: Chronic cardiomegaly and aortic tortuosity. No pulmonary edema, pleural effusion, focal airspace disease or pneumothorax. No acute osseous abnormalities. IMPRESSION: Cardiomegaly without congestive failure or acute chest finding. Electronically Signed   By: Keith Rake M.D.   On: 06/05/2018 03:21   US Renal  Result Date: 06/06/2018 CLINICAL DATA:  Acute kidney injury, history coronary artery disease, chronic diastolic CHF, COPD, type II diabetes mellitus, hypertension, hypertensive heart disease, prostate cancer, renal cell carcinoma EXAM: RENAL / URINARY TRACT ULTRASOUND COMPLETE COMPARISON:  CT abdomen and pelvis 06/05/2017 FINDINGS: Right Kidney: Length: 14.5 cm. Normal cortical thickness. Upper normal echogenicity. Cystic lesion at mid kidney, 2.7 x 2.2 x 3.0 cm, containing a few scattered low level internal echoes; this lesion is hyperdense on the prior CT exam. An additional nodule at the inferior pole measures 3.9 x 2.0 x 2.7 cm and  demonstrates multiple septations and low-level scattered internal echogenicity. This lesion is mildly complicated and demonstrated low-attenuation and high attenuation components on the prior CT. No hydronephrosis or shadowing calcification. Left Kidney: Length: 16.7 cm. Normal cortical thickness. Minimally increased cortical echogenicity. Small simple cyst at inferior pole, 3.9 x 2.0 x 2.7 cm. Additional complicated cyst at upper pole 2.5 x 2.1 x 2.9 cm with a scattered internal echoes. Additional mass at upper pole appear solid, 4.9 x 4.5 x 4.5 cm, highly suspicious for a renal neoplasm. No hydronephrosis or shadowing calcification. Bladder: Normal appearing bladder. Marked prostatic enlargement, 9.7 x 4.5 x 6.8 cm IMPRESSION: Probable medical renal disease changes of the kidneys. BILATERAL simple renal cysts. Additional solid mass at upper pole LEFT kidney 4.9 cm greatest size most likely representing a solid renal neoplasm, demonstrated enhancement on a prior CT exam of 03/24/2018 and now demonstrates an interval increase in size. Additional complicated cystic lesions at the inferior pole of RIGHT kidney and upper pole LEFT kidney, indeterminate, cannot exclude renal neoplasms; consider MR characterization. Marked prostatic enlargement. Electronically Signed   By: Lavonia Dana M.D.   On: 06/06/2018 17:55   Dg Bone Survey Met  Result Date: 06/10/2018 CLINICAL DATA:  Multiple myeloma EXAM: METASTATIC BONE SURVEY COMPARISON:  None. FINDINGS: Metastatic bone survey was performed. 1. Cervical Spine: Degenerative disc disease C3 through C7. No lytic abnormality. 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 2. Both shoulders: Mild osteoarthritis of the acromioclavicular and glenohumeral joints bilaterally. No lytic abnormality or fracture. No joint dislocation. 3. Humeri: No lytic abnormality.  Mild degenerative subcortical cystic change of the right humeral head adjacent to the greater tuberosity.  4. Bilateral forearms: No lytic abnormality of the radius or ulna on either side. Mild degenerative cystic change of the left scaphoid. 5. Lateral skull: Negative aggressive osteolytic lesions. 6. Pain and lateral thoracic spine: Multilevel degenerative disc disease and endplate spurring. Patchy ill-defined appearance of the mid to lower thoracic vertebral bodies. Cannot exclude small lytic foci. Dedicated CT of the thoracic spine may help for better assessment. 7. Lumbar spine: Mild degenerative change with disc flattening and endplate spurring. No aggressive lytic abnormalities. 8. Chest: Cardiomegaly. Aortic atherosclerosis. Clear lungs. No definite lytic abnormality of the visualized ribs. 9. AP pelvis: No fracture nor aggressive lytic abnormality noted. 10. Femora: Negative for lytic abnormality. 11. Tibia and fibula: Negative bilaterally lytic abnormality. IMPRESSION: Slightly patchy ill-defined appearance of the thoracic spine some which is due to overlap of adjacent ribs and lung limiting assessment. The possibility of subtle lytic abnormalities is not excluded as a result. CT may help for better assessment. Otherwise, no aggressive nor suspicious lytic abnormalities are identified. Electronically Signed   By: Ashley Royalty M.D.   On: 06/10/2018 23:57   Ct Angio Chest Aorta W &/or Wo Contrast  Result Date: 05/19/2018 CLINICAL DATA:  Yearly follow up thoracic aortic aneurysm. Hx of recent syncopal episodes in [place] and post prandial. EXAM: CT ANGIOGRAPHY CHEST WITH CONTRAST TECHNIQUE: Multidetector CT imaging of the chest was performed using the standard protocol during bolus administration of intravenous contrast. Multiplanar CT image reconstructions and MIPs were obtained to evaluate the vascular anatomy. CONTRAST:  165m ISOVUE-370 IOPAMIDOL (ISOVUE-370) INJECTION 76% COMPARISON:  05/30/2017 and previous FINDINGS: Cardiovascular: Cardiomegaly. No pericardial effusion. Dilated central pulmonary arteries.  Fair contrast opacification of pulmonary artery branches; the exam was not optimized for detection of pulmonary emboli. Mild scattered coronary calcifications. Good contrast opacification of the thoracic aorta without dissection or stenosis. Transverse diameters as follows: 5.2 cm sinuses of Valsalva 4.2 cm  sino-tubular junction 4.6 cm mid ascending (previously 4.3 cm) 4.3 cm distal ascending/proximal arch 3.4 cm distal arch 3.2 cm proximal descending 2.8 cm distal descending Bovine variant brachiocephalic arterial origin anatomy without proximal stenosis. Visualized proximal abdominal aorta unremarkable. Ectatic distal celiac axis up to 1.5 cm diameter. Mediastinum/Nodes: No hilar or mediastinal adenopathy. Lungs/Pleura: No pleural effusion.  No pneumothorax.  Lungs clear. Upper Abdomen: Nodular liver contour. Stable hepatic cysts. Exophytic 5.8 cm upper pole left renal lesion measuring above fluid attenuation. No acute findings. Musculoskeletal: 5 mm subcutaneous metallic density in the left anterior chest wall subcutaneous tissues. Mid and lower thoracic spondylitic change. No fracture or worrisome bone lesion. Review of the MIP images confirms the above findings. IMPRESSION: 1. 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 Recommend semi-annual imaging followup by CTA or MRA and referral to cardiothoracic surgery if not already obtained. This recommendation follows 2010 ACCF/AHA/AATS/ACR/ASA/SCA/SCAI/SIR/STS/SVM Guidelines for the Diagnosis and Management of Patients With Thoracic Aortic Disease. Circulation. 2010; 121: eX528-U1322. 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 Recommend dedicated renal protocol CT or MR with contrast. 3. Coronary calcifications. The severity of coronary artery disease and any potential stenosis cannot be assessed on this non-gated CT  examination. Assessment for potential risk factor modification, dietary therapy or pharmacologic therapy may be warranted, if clinically indicated. 4. Nodular liver contour suggesting cirrhosis. Electronically Signed  By: Lucrezia Europe M.D.   On: 05/19/2018 13:45     LOS: 7 days   Oren Binet, MD  [facility]  If 7PM-7AM, please contact night-coverage  Please page via [email or web address]-click on MD name and type text message  06/12/2018, 2:36 PM

## 2018-06-12 NOTE — Progress Notes (Signed)
Blood transfusion completed without any reactions. Will continue to monitor.

## 2018-06-12 NOTE — Progress Notes (Signed)
Patient asking for something to eat and drink.Reminded patient that he is scheduled for bone marrow marrow biopsy  this morning and so could not feed him. Patient then stated he felt like his blood sugar was dropping. Staff checked blood sugar results 86 normal. Patient then stated," That is not a normal blood sugar for me. I need something to eat or drink." Text paged NP X.Blount. Order received to start IVF and orders carried out.

## 2018-06-12 NOTE — Progress Notes (Signed)
This nurse walked to lab to pick up 24 hour urine container. Due to renovations being done in lab.Lab personnel and unable to locate specimen container.Lab personnel suggest coming back after shift change for day shift to locate specimen container.

## 2018-06-12 NOTE — Progress Notes (Signed)
PT Cancellation Note  Patient Details Name: [name] [name] MRN: [id number] DOB: July 05, 1946   Cancelled Treatment:    Reason Eval/Treat Not Completed: Patient declined, no reason specified Patient declined participating in therapy at this time and reports he wants to eat lunch first and is "very sleepy". PT will continue to follow acutely.    Salina April, PTA Acute [facility] Services Pager: [phone] Office: [phone]   06/12/2018, 1:25 PM

## 2018-06-12 NOTE — Procedures (Signed)
CT bone marrow biopsy with 2 aspirates and 1 core.  Minimal blood loss and no immediate complication.  See full report in Imaging.

## 2018-06-12 NOTE — Progress Notes (Signed)
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 sodium chloride   Intravenous Once  . amLODipine  5 mg Oral Daily  . fentaNYL      . finasteride  5 mg Oral Daily  . heparin  5,000 Units Subcutaneous Q8H  . lidocaine      . midazolam      . tamsulosin  0.4 mg Oral QHS   Continuous Infusions: . sodium chloride Stopped (06/10/18 0420)  . ampicillin-sulbactam (UNASYN) IV 3 g (06/12/18 1155)   PRN Meds:.acetaminophen **OR** acetaminophen, albuterol, HYDROcodone-acetaminophen, metoprolol tartrate, ondansetron **OR** ondansetron (ZOFRAN) IV, senna-docusate, sodium chloride  Current Labs: reviewed  SPEP + SFLC pending C3, C4, ANAI, ANCA, Hep Panel pending  Physical Exam:  Blood pressure (!) 145/89, pulse (!) 54, temperature 98.3 F (36.8 C), resp. rate 15, height 5' 6"  (1.676 m), weight 101.4 kg, SpO2 99 %. NAD, lying in bed  RRR nl s1s2 CtAB Trace Eemea No rashes/ Nonfocal, CN2-12 intace EOMI NCAT  A 1. 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  Serologies included weakly increased PR3 ANCA of unclear significance 2. Abnormal SFLC, history of MGUS; elevated Kappa level -- heme following, has outpatient oncologist, BM Bx 10/11 3. Weakly positive PR3 with hematuria 4. BPH with  retention, on flomax/proscar, Foley removed 5. Acinetobacter bacteremia and UTI: on unasyn, ID following 6. Anemia, as required transfusion 7. CAD  8. 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 Patient not a candidate for renal biopsy to identify exact etiologies of renal failure given bilateral suspected renal cell carcinomas . Given his overall status and concern that he would effectively follow through I do not think he is a candidate for therapeutic pheresis . He will need to follow closely with oncology and initiate therapy as directed by work-up . Continue to observe for obstruction after Foley catheter removed  . Daily weights, Daily Renal Panel, Strict I/Os, Avoid nephrotoxins (NSAIDs, judicious IV Contrast)   [name] MD 06/12/2018, 12:21 PM  Recent Labs  Lab 06/10/18 0337 06/11/18 0324 06/12/18 0408  NA 138 138 138  K 3.6 3.6 3.4*  CL 105 103 101  CO2 22 22 23   GLUCOSE 93 91 88  BUN 63* 60* 62*  CREATININE 5.77* 5.50* 5.22*  CALCIUM 8.2* 8.5* 8.5*  PHOS 5.0* 5.0* 5.2*   Recent Labs  Lab 06/10/18 0337 06/10/18 1605 06/11/18 0324 06/12/18 0408  WBC 7.5  --  7.4 6.4  NEUTROABS  --   --   --  4.0  HGB 6.8* 8.1* 8.3* 7.4*  HCT 21.8* 26.0* 25.4* 23.7*  MCV 98.6  --  97.3 96.7  PLT 341  --  383 418*

## 2018-06-13 LAB — BPAM RBC
Blood Product Expiration Date: 201910172359
Blood Product Expiration Date: 201910292359
ISSUE DATE / TIME: 201910091137
ISSUE DATE / TIME: 201910111633
Unit Type and Rh: 6200
Unit Type and Rh: 6200

## 2018-06-13 LAB — TYPE AND SCREEN
ABO/RH(D): A POS
Antibody Screen: NEGATIVE
Unit division: 0
Unit division: 0

## 2018-06-13 LAB — RENAL FUNCTION PANEL
ALBUMIN: 2.9 g/dL — AB (ref 3.5–5.0)
Anion gap: 11 (ref 5–15)
BUN: 59 mg/dL — AB (ref 8–23)
CHLORIDE: 103 mmol/L (ref 98–111)
CO2: 25 mmol/L (ref 22–32)
CREATININE: 5.22 mg/dL — AB (ref 0.61–1.24)
Calcium: 8.6 mg/dL — ABNORMAL LOW (ref 8.9–10.3)
GFR calc Af Amer: 11 mL/min — ABNORMAL LOW (ref >60)
GFR, EST NON AFRICAN AMERICAN: 10 mL/min — AB (ref >60)
Glucose, Bld: 97 mg/dL (ref 70–99)
PHOSPHORUS: 5 mg/dL — AB (ref 2.5–4.6)
Potassium: 3.7 mmol/L (ref 3.5–5.1)
SODIUM: 139 mmol/L (ref 135–145)

## 2018-06-13 LAB — BETA 2 MICROGLOBULIN, SERUM: BETA 2 MICROGLOBULIN: 8.8 mg/L — AB (ref 0.6–2.4)

## 2018-06-13 LAB — CBC
HEMATOCRIT: 25.7 % — AB (ref 39.0–52.0)
Hemoglobin: 8.5 g/dL — ABNORMAL LOW (ref 13.0–17.0)
MCH: 31.4 pg (ref 26.0–34.0)
MCHC: 33.1 g/dL (ref 30.0–36.0)
MCV: 94.8 fL (ref 80.0–100.0)
Platelets: 392 10*3/uL (ref 150–400)
RBC: 2.71 MIL/uL — ABNORMAL LOW (ref 4.22–5.81)
RDW: 13.3 % (ref 11.5–15.5)
WBC: 6.1 10*3/uL (ref 4.0–10.5)
nRBC: 0 % (ref 0.0–0.2)

## 2018-06-13 MED ORDER — PANTOPRAZOLE SODIUM 40 MG PO TBEC
40.0000 mg | DELAYED_RELEASE_TABLET | Freq: Every day | ORAL | Status: DC
  Administered 2018-06-13 – 2018-06-16 (×4): 40 mg via ORAL
  Filled 2018-06-13 (×5): qty 1

## 2018-06-13 NOTE — Progress Notes (Signed)
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) likely from acinetobacter uti+sepsis and/or obstructive uropathy  Subjective:    No interval events  Starting on dexamethasone  Patient continues to periodically try to leave AMA, requires frequent redirection  1.2 L urine output recorded, several other voids not recorded  Serum creatinine stable at 5.2, BUN 59, potassium 3.7  Potentially to start further therapy early next week for his myeloma  10/11 0701 - 10/12 0700 In: 861.7 [P.O.:360; Blood:315; IV Piggyback:186.7] Out: 1150 [Urine:1150]  Filed Weights   06/10/18 0506 06/11/18 1700 06/13/18 0432  Weight: 102.2 kg 101.4 kg 102.7 kg    Scheduled Meds: . sodium chloride   Intravenous Once  . amLODipine  5 mg Oral Daily  . dexamethasone  20 mg Oral Daily  . finasteride  5 mg Oral Daily  . heparin  5,000 Units Subcutaneous Q8H  . tamsulosin  0.4 mg Oral QHS   Continuous Infusions: . ampicillin-sulbactam (UNASYN) IV Stopped (06/13/18 0845)   PRN Meds:.acetaminophen **OR** acetaminophen, albuterol, HYDROcodone-acetaminophen, metoprolol tartrate, ondansetron **OR** ondansetron (ZOFRAN) IV, senna-docusate, sodium chloride  Current Labs: reviewed  SPEP + SFLC pending C3, C4, ANAI, ANCA, Hep Panel pending  Physical Exam:  Blood pressure 103/84, pulse 69, temperature 98.6 F (37 C), temperature source Oral, resp. rate 16, height 5\' 6"  (1.676 m), weight 102.7 kg, SpO2 98 %. NAD, lying in bed  RRR nl s1s2 CtAB Trace Eemea No rashes/ Nonfocal, CN2-12 intace EOMI NCAT  A 1. 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  Serologies included weakly increased PR3 ANCA of unclear significance 2. Abnormal SFLC, history of MGUS; elevated Kappa level -- heme following, has outpatient oncologist, BM Bx 10/11, start dexamethasone 10/12 3. Weakly positive PR3 with hematuria 4. BPH  with retention, on flomax/proscar, Foley removed no evidence of recurrent obstruction 5. Acinetobacter bacteremia and UTI: on unasyn, ID following 6. Anemia, as required transfusion 7. CAD  8. 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 Patient not a candidate for renal biopsy to identify exact etiologies of renal failure given bilateral suspected renal cell carcinomas . Given his overall status, lack of clarity in etiologies of his renal failure, and concern that he would effectively follow through I do not think he is a candidate for therapeutic pheresis . He will need to follow closely with oncology and initiate therapy as directed by work-up . We will continue to follow along . Daily weights, Daily Renal Panel, Strict I/Os, Avoid nephrotoxins (NSAIDs, judicious IV Contrast)   [name] MD 06/13/2018, 10:28 AM  Recent Labs  Lab 06/11/18 0324 06/12/18 0408 06/13/18 0303  NA 138 138 139  K 3.6 3.4* 3.7  CL 103 101 103  CO2 22 23 25   GLUCOSE 91 88 97  BUN 60* 62* 59*  CREATININE 5.50* 5.22* 5.22*  CALCIUM 8.5* 8.5* 8.6*  PHOS 5.0* 5.2* 5.0*   Recent Labs  Lab 06/11/18 0324 06/12/18 0408 06/13/18 0705  WBC 7.4 6.4 6.1  NEUTROABS  --  4.0  --   HGB 8.3* 7.4* 8.5*  HCT 25.4* 23.7* 25.7*  MCV 97.3 96.7 94.8  PLT 383 418* 392

## 2018-06-13 NOTE — Progress Notes (Signed)
PROGRESS NOTE        PATIENT DETAILS Name: [name] [name] Age: 72 y.o. Sex: male Date of Birth: [birth year] Admit Date: 06/05/2018 Admitting Physician Albertine Patricia, MD MWN:[id number], Christean Grief, MD  Brief Narrative: Patient is a 72 y.o. male with history of CAD, hypertension, PAF not on anticoagulation-recent right hemicolectomy for a cecal mass-just discharged by the general surgical service on 6/44-[id number] course was complicated by development of obstructive uropathy and AKI ( discharged with a Foley catheter, successful outpatient voiding trial on 10/1)-presented to the hospital with sepsis secondary to Acinetobacter bacteremia, acute kidney injury.  Further work-up demonstrates significantly elevated serum kappa light chains, mildly positive ANCA serology.  See below for further details  Subjective: Much more calmer today-understands why he needs to stay in the hospital.  Assessment/Plan: Sepsis secondary to Acinetobacter bacteremia along with Acinetobacter and enterococcal UTI: Secondary to recent hospitalization for laparotomy/recent Foley catheter placement-sepsis pathophysiology has resolved-remains on IV Unasyn-stop date of 10/12.  Repeat blood cultures on 10/9- so far.  AKI on CKD stage III: Initially thought to have hemodynamically mediated kidney injury with some contribution from obstructive uropathy.  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  Creatinine slowly downtrending but seems to have plateaued and compared to yesterday.  Foley catheter was discontinued on 10/9-continue to check postvoid residuals by frequent bladder scans.  Nephrology following-did not think he is a good candidate for plasmapheresis.  Starting Decadron on 10/12 as recommended by oncology-awaiting bone marrow biopsy.  There is some question of whether patient has lesions suspicious for renal cell carcinoma on his most  recent CT scan of the abdomen-hence even though patient has a weakly positive ANCA serology-given high likelihood of myeloma kidney-it is felt that kidney biopsy would not be beneficial in this patient.  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  Oncology suspecting light chain myeloma-bone survey negative-underwent bone marrow biopsy on 10/11.  Discussed with Dr Pollyann Samples that sepsis is significantly better-we will start Decadron milligrams daily from 10/12 for 4 doses.  Once bone marrow biopsy is back-oncology will delineate further plans.  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  Required second unit of PRBC transfusion on 10/11-hemoglobin stable posttransfusion-follow CBC periodically.   Hypokalemia: Repleted  BPH with recent urinary retention/obstructive uropathy requiring Foley catheter placement: Foley catheter was inserted on 10/5 due to worsening renal function and concern for obstructive uropathy contributing to AKI-with improving renal function-and leaking Foley catheter-this catheter was discontinued on 10/9.  Continue to monitor closely-with frequent bladder scans.  Continue Flomax and Proscar.   Chronic diastolic heart failure: Remains euvolemic-follow volume status closely.  Hypertension: BP controlled-continue amlodipine.  CAD: No anginal symptoms-follow.  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  DVT Prophylaxis: Prophylactic Heparin   Code Status: Full code  Family Communication: None at bedside  Disposition Plan: Remain inpatient-probably require several more days of hospitalization.   Antimicrobial agents: Anti-infectives (From admission, onward)   Start     Dose/Rate Route Frequency  Ordered Stop   06/08/18 1200  Ampicillin-Sulbactam (UNASYN) 3 g in sodium chloride 0.9 % 100 mL IVPB     3  g 200 mL/hr over 30 Minutes Intravenous Every 12 hours 06/08/18 1051 06/13/18 2359   06/08/18 1100  Ampicillin-Sulbactam (UNASYN) 3 g in sodium chloride 0.9 % 100 mL IVPB  Status:  Discontinued     3 g 200 mL/hr over 30 Minutes Intravenous Every 24 hours 06/08/18 1049 06/08/18 1051   06/08/18 1000  meropenem (MERREM) 1 g in sodium chloride 0.9 % 100 mL IVPB  Status:  Discontinued     1 g 200 mL/hr over 30 Minutes Intravenous Every 24 hours 06/07/18 1303 06/08/18 1026   06/06/18 1345  meropenem (MERREM) 1 g in sodium chloride 0.9 % 100 mL IVPB  Status:  Discontinued     1 g 200 mL/hr over 30 Minutes Intravenous Every 12 hours 06/06/18 1333 06/07/18 1303   06/06/18 0800  ceFEPIme (MAXIPIME) 1 g in sodium chloride 0.9 % 100 mL IVPB  Status:  Discontinued     1 g 200 mL/hr over 30 Minutes Intravenous Every 24 hours 06/05/18 1056 06/06/18 1333   06/05/18 0900  vancomycin (VANCOCIN) 2,000 mg in sodium chloride 0.9 % 500 mL IVPB     2,000 mg 250 mL/hr over 120 Minutes Intravenous  Once 06/05/18 0816 06/05/18 1149   06/05/18 0730  ceFEPIme (MAXIPIME) 2 g in sodium chloride 0.9 % 100 mL IVPB     2 g 200 mL/hr over 30 Minutes Intravenous  Once 06/05/18 0720 06/05/18 0833   06/05/18 0730  metroNIDAZOLE (FLAGYL) IVPB 500 mg  Status:  Discontinued     500 mg 100 mL/hr over 60 Minutes Intravenous Every 8 hours 06/05/18 0720 06/05/18 1035   06/05/18 0730  vancomycin (VANCOCIN) IVPB 1000 mg/200 mL premix  Status:  Discontinued     1,000 mg 200 mL/hr over 60 Minutes Intravenous  Once 06/05/18 0720 06/05/18 0816      Procedures: None  CONSULTS:  None  Time spent: 25 minutes-Greater than 50% of this time was spent in counseling, explanation of diagnosis, planning of further management, and coordination of care.  MEDICATIONS: Scheduled Meds: . sodium chloride   Intravenous Once  .  amLODipine  5 mg Oral Daily  . dexamethasone  20 mg Oral Daily  . finasteride  5 mg Oral Daily  . heparin  5,000 Units Subcutaneous Q8H  . tamsulosin  0.4 mg Oral QHS   Continuous Infusions: . ampicillin-sulbactam (UNASYN) IV 3 g (06/13/18 1032)   PRN Meds:.acetaminophen **OR** acetaminophen, albuterol, HYDROcodone-acetaminophen, metoprolol tartrate, ondansetron **OR** ondansetron (ZOFRAN) IV, senna-docusate, sodium chloride   PHYSICAL EXAM: Vital signs: Vitals:   06/12/18 1952 06/13/18 0425 06/13/18 0432 06/13/18 0844  BP: (!) 146/85 (!) 147/82  103/84  Pulse: (!) 58 60  69  Resp: 18 16    Temp: 98.2 F (36.8 C) 98.6 F (37 C)    TempSrc: Oral Oral    SpO2: 99% 98%    Weight:   102.7 kg   Height:       Filed Weights   06/10/18 0506 06/11/18 1700 06/13/18 0432  Weight: 102.2 kg 101.4 kg 102.7 kg   Body mass index is 36.54 kg/m.   General appearance:Awake, alert, not in any distress.  Eyes:no scleral icterus. HEENT: Atraumatic and Normocephalic Neck: supple, no JVD. Resp:Good air entry bilaterally,no rales or rhonchi CVS: S1 S2 regular, no murmurs.  GI: Bowel sounds present, Non tender and not distended with no gaurding, rigidity or rebound. Extremities: B/L Lower Ext shows no edema, both legs are warm to touch Neurology:  Non focal Psychiatric:  Normal judgment and insight. Normal mood. Musculoskeletal:No digital cyanosis Skin:No Rash, warm and dry Wounds:N/A  I have personally reviewed following labs and imaging studies  LABORATORY DATA: CBC: Recent Labs  Lab 06/09/18 1019 06/10/18 0337 06/10/18 1605 06/11/18 0324 06/12/18 0408 06/13/18 0705  WBC 6.5 7.5  --  7.4 6.4 6.1  NEUTROABS  --   --   --   --  4.0  --   HGB 7.2* 6.8* 8.1* 8.3* 7.4* 8.5*  HCT 23.0* 21.8* 26.0* 25.4* 23.7* 25.7*  MCV 100.4* 98.6  --  97.3 96.7 94.8  PLT 267 341  --  383 418* 026    Basic Metabolic Panel: Recent Labs  Lab 06/08/18 0553 06/09/18 1019 06/10/18 0337  06/11/18 0324 06/12/18 0408 06/13/18 0303  NA 136 139 138 138 138 139  K 3.5 3.6 3.6 3.6 3.4* 3.7  CL 105 105 105 103 101 103  CO2 22 20* _0 GLUCOSE 101* 140* 93 91 88 97  BUN 66* 64* 63* 60* 62* 59*  CREATININE 5.91* 5.91* 5.77* 5.50* 5.22* 5.22*  CALCIUM 8.2* 8.5* 8.2* 8.5* 8.5* 8.6*  PHOS 4.1  --  5.0* 5.0* 5.2* 5.0*    GFR: Estimated Creatinine Clearance: 13.9 mL/min (A) (by C-G formula based on SCr of 5.22 mg/dL (H)).  Liver Function Tests: Recent Labs  Lab 06/08/18 0553 06/10/18 0337 06/11/18 0324 06/12/18 0408 06/13/18 0303  ALBUMIN 2.9* 2.8* 3.0* 3.1* 2.9*   No results for input(s): LIPASE, AMYLASE in the last 168 hours. No results for input(s): AMMONIA in the last 168 hours.  Coagulation Profile: Recent Labs  Lab 06/12/18 0408  INR 1.06    Cardiac Enzymes: No results for input(s): CKTOTAL, CKMB, CKMBINDEX, TROPONINI in the last 168 hours.  BNP (last 3 results) No results for input(s): PROBNP in the last 8760 hours.  HbA1C: No results for input(s): HGBA1C in the last 72 hours.  CBG: Recent Labs  Lab 06/09/18 1652 06/10/18 0852 06/12/18 0432 06/12/18 0819 06/12/18 2123  GLUCAP 124* 87 86 84 93    Lipid Profile: No results for input(s): CHOL, HDL, LDLCALC, TRIG, CHOLHDL, LDLDIRECT in the last 72 hours.  Thyroid Function Tests: No results for input(s): TSH, T4TOTAL, FREET4, T3FREE, THYROIDAB in the last 72 hours.  Anemia Panel: No results for input(s): VITAMINB12, FOLATE, FERRITIN, TIBC, IRON, RETICCTPCT in the last 72 hours.  Urine analysis:    Component Value Date/Time   COLORURINE STRAW (A) 06/07/2018 1643   APPEARANCEUR CLEAR 06/07/2018 1643   LABSPEC 1.008 06/07/2018 1643   PHURINE 6.0 06/07/2018 1643   GLUCOSEU NEGATIVE 06/07/2018 1643   HGBUR LARGE (A) 06/07/2018 1643   BILIRUBINUR NEGATIVE 06/07/2018 1643   KETONESUR NEGATIVE 06/07/2018 1643   PROTEINUR 30 (A) 06/07/2018 1643   NITRITE NEGATIVE 06/07/2018 1643    LEUKOCYTESUR MODERATE (A) 06/07/2018 1643    Sepsis Labs: Lactic Acid, Venous    Component Value Date/Time   LATICACIDVEN 1.63 06/05/2018 0310    MICROBIOLOGY: Recent Results (from the past 240 hour(s))  Blood culture (routine x 2)     Status: Abnormal (Preliminary result)   Collection Time: 06/05/18  2:52 AM  Result Value Ref Range Status   Specimen Description BLOOD LEFT HAND  Final   Special Requests   Final    BOTTLES DRAWN AEROBIC AND ANAEROBIC Blood Culture adequate volume   Culture  Setup Time   Final    IN BOTH AEROBIC AND ANAEROBIC BOTTLES GRAM NEGATIVE RODS CRITICAL RESULT  CALLED TO, READ BACK BY AND VERIFIED WITH: L SEAY PHARMD 06/06/18 1225 JDW    Culture (A)  Final    ACINETOBACTER CALCOACETICUS/BAUMANNII COMPLEX Sent to [facility] for further susceptibility testing. Performed at [facility], [place] [address] [place], [place] [address]    Report Status PENDING  Incomplete   Organism ID, Bacteria ACINETOBACTER CALCOACETICUS/BAUMANNII COMPLEX  Final      Susceptibility   Acinetobacter calcoaceticus/baumannii complex - MIC*    CEFTAZIDIME 8 SENSITIVE Sensitive     CEFTRIAXONE 16 INTERMEDIATE Intermediate     CIPROFLOXACIN 0.5 SENSITIVE Sensitive     GENTAMICIN 4 SENSITIVE Sensitive     IMIPENEM <=0.25 SENSITIVE Sensitive     PIP/TAZO <=4 SENSITIVE Sensitive     TRIMETH/SULFA <=20 SENSITIVE Sensitive     CEFEPIME 8 SENSITIVE Sensitive     AMPICILLIN/SULBACTAM <=2 SENSITIVE Sensitive     * ACINETOBACTER CALCOACETICUS/BAUMANNII COMPLEX  Blood Culture ID Panel (Reflexed)     Status: None   Collection Time: 06/05/18  2:52 AM  Result Value Ref Range Status   Enterococcus species NOT DETECTED NOT DETECTED Final    Comment: CRITICAL RESULT CALLED TO, READ BACK BY AND VERIFIED WITH: L SEAY PHARMD 06/06/18 1225 JDW    Listeria monocytogenes NOT DETECTED NOT DETECTED Final   Staphylococcus species NOT DETECTED NOT DETECTED Final   Staphylococcus aureus (BCID) NOT  DETECTED NOT DETECTED Final   Streptococcus species NOT DETECTED NOT DETECTED Final   Streptococcus agalactiae NOT DETECTED NOT DETECTED Final   Streptococcus pneumoniae NOT DETECTED NOT DETECTED Final   Streptococcus pyogenes NOT DETECTED NOT DETECTED Final   Acinetobacter baumannii NOT DETECTED NOT DETECTED Final   Enterobacteriaceae species NOT DETECTED NOT DETECTED Final   Enterobacter cloacae complex NOT DETECTED NOT DETECTED Final   Escherichia coli NOT DETECTED NOT DETECTED Final   Klebsiella oxytoca NOT DETECTED NOT DETECTED Final   Klebsiella pneumoniae NOT DETECTED NOT DETECTED Final   Proteus species NOT DETECTED NOT DETECTED Final   Serratia marcescens NOT DETECTED NOT DETECTED Final   Haemophilus influenzae NOT DETECTED NOT DETECTED Final   Neisseria meningitidis NOT DETECTED NOT DETECTED Final   Pseudomonas aeruginosa NOT DETECTED NOT DETECTED Final   Candida albicans NOT DETECTED NOT DETECTED Final   Candida glabrata NOT DETECTED NOT DETECTED Final   Candida krusei NOT DETECTED NOT DETECTED Final   Candida parapsilosis NOT DETECTED NOT DETECTED Final   Candida tropicalis NOT DETECTED NOT DETECTED Final    Comment: Performed at [facility], [place] [address] [place], [place] [address]  Blood culture (routine x 2)     Status: None   Collection Time: 06/05/18  3:00 AM  Result Value Ref Range Status   Specimen Description BLOOD RIGHT ANTECUBITAL  Final   Special Requests   Final    BOTTLES DRAWN AEROBIC AND ANAEROBIC Blood Culture adequate volume   Culture   Final    NO GROWTH 5 DAYS Performed at [facility], [place] [address] [place], [place] [address]    Report Status 06/10/2018 FINAL  Final  Culture, Urine     Status: Abnormal   Collection Time: 06/05/18  8:00 AM  Result Value Ref Range Status   Specimen Description URINE, RANDOM  Final   Special Requests   Final    NONE Performed at [facility], [place] [address] [place], [place] [address]     Culture (A)  Final    >=100,000 COLONIES/mL ENTEROCOCCUS FAECALIS 10,000  COLONIES/mL ACINETOBACTER CALCOACETICUS/BAUMANNII COMPLEX    Report Status 06/08/2018 FINAL  Final   Organism ID, Bacteria ENTEROCOCCUS FAECALIS (A)  Final   Organism ID, Bacteria ACINETOBACTER CALCOACETICUS/BAUMANNII COMPLEX (A)  Final      Susceptibility   Acinetobacter calcoaceticus/baumannii complex - MIC*    CEFTAZIDIME 8 SENSITIVE Sensitive     CEFTRIAXONE 16 INTERMEDIATE Intermediate     CIPROFLOXACIN 1 SENSITIVE Sensitive     GENTAMICIN 4 SENSITIVE Sensitive     IMIPENEM <=0.25 SENSITIVE Sensitive     PIP/TAZO >=128 RESISTANT Resistant     TRIMETH/SULFA <=20 SENSITIVE Sensitive     CEFEPIME 8 SENSITIVE Sensitive     AMPICILLIN/SULBACTAM <=2 SENSITIVE Sensitive     * 10,000 COLONIES/mL ACINETOBACTER CALCOACETICUS/BAUMANNII COMPLEX   Enterococcus faecalis - MIC*    AMPICILLIN <=2 SENSITIVE Sensitive     LEVOFLOXACIN 1 SENSITIVE Sensitive     NITROFURANTOIN <=16 SENSITIVE Sensitive     VANCOMYCIN 1 SENSITIVE Sensitive     * >=100,000 COLONIES/mL ENTEROCOCCUS FAECALIS  MRSA PCR Screening     Status: None   Collection Time: 06/05/18  1:04 PM  Result Value Ref Range Status   MRSA by PCR NEGATIVE NEGATIVE Final    Comment:        The GeneXpert MRSA Assay (FDA approved for NASAL specimens only), is one component of a comprehensive MRSA colonization surveillance program. It is not intended to diagnose MRSA infection nor to guide or monitor treatment for MRSA infections. Performed at [facility], [place] [address] [place], [place] [address]   Culture, blood (routine x 2)     Status: None (Preliminary result)   Collection Time: 06/10/18  4:50 PM  Result Value Ref Range Status   Specimen Description BLOOD LEFT HAND  Final   Special Requests   Final    BOTTLES DRAWN AEROBIC ONLY Blood Culture adequate volume   Culture   Final    NO GROWTH 3 DAYS Performed at [facility], [place] [address], [place],  [address]    Report Status PENDING  Incomplete  Culture, blood (routine x 2)     Status: None (Preliminary result)   Collection Time: 06/10/18  5:00 PM  Result Value Ref Range Status   Specimen Description BLOOD RIGHT ANTECUBITAL  Final   Special Requests   Final    BOTTLES DRAWN AEROBIC ONLY Blood Culture adequate volume   Culture   Final    NO GROWTH 3 DAYS Performed at [facility], [place] [address] [place],  [address]    Report Status PENDING  Incomplete    RADIOLOGY STUDIES/RESULTS: Ct Abdomen Pelvis Wo Contrast  Result Date: 06/05/2018 CLINICAL DATA:  Abdominal pain, fever.  Recent partial colectomy. EXAM: CT ABDOMEN AND PELVIS WITHOUT CONTRAST TECHNIQUE: Multidetector CT imaging of the abdomen and pelvis was performed following the standard protocol without IV contrast. COMPARISON:  05/30/2018 FINDINGS: Lower chest: Cardiomegaly.  Bibasilar atelectasis.  No effusions. Hepatobiliary: Small scattered hepatic cysts, small scattered hepatic low-density lesions throughout the liver, likely cysts although these cannot be characterized fully without intravenous contrast. Gallbladder unremarkable. Pancreas: No focal abnormality or ductal dilatation. Spleen: No focal abnormality.  Normal size. Adrenals/Urinary Tract: Numerous low-density, mixed density and high-density masses throughout the kidney are stable since recent study and cannot be characterized fully without intravenous contrast. At least 2 of these masses were shown on prior contrast enhanced CT to be suspicious for renal cell carcinoma. No hydronephrosis. Adrenal glands unremarkable. Urinary  bladder decompressed and mildly thick walled, likely related to prostate enlargement. Stomach/Bowel: Descending colonic and sigmoid diverticulosis. Postoperative changes in the right colon from right hemicolectomy, stable. Again seen is the ventral hernia containing a portion of the mid transverse colon which appears  unremarkable. No evidence of obstruction or bowel wall thickening. Stomach and small bowel grossly unremarkable. No evidence of bowel obstruction. Vascular/Lymphatic: Aortic atherosclerosis. No enlarged abdominal or pelvic lymph nodes. Reproductive: Marked enlargement of the prostate. Other: No free fluid or free air. Continued wall thickening within the periphery of the midline supraumbilical ventral hernia which also contains the transverse colon. Again, no underlying bowel wall thickening. This has a stable appearance since prior study. Musculoskeletal: No acute bony abnormality. Diffuse degenerative changes in the lumbar spine. IMPRESSION: Stable appearance of prior right hemicolectomy. Stable supraumbilical ventral hernia containing a portion of the transverse colon. The wall of the ventral hernia again appears thickened and indistinct, but underlying bowel does not appear abnormal. Scattered diverticulosis.  No active diverticulitis. Marked prostate enlargement with bladder wall thickening. Multiple renal lesions. At least 2 of which were shown on prior contrast-enhanced CT to be suspicious for renal cell carcinoma. No change. Scattered hepatic low-density lesions cannot be characterized without intravenous contrast, likely cysts. Cardiomegaly, bibasilar atelectasis. Aortic atherosclerosis. Electronically Signed   By: Rolm Baptise M.D.   On: 06/05/2018 08:22   Ct Abdomen Pelvis Wo Contrast  Result Date: 05/30/2018 CLINICAL DATA:  Partial right colectomy, perform laparoscopically. EXAM: CT ABDOMEN AND PELVIS WITHOUT CONTRAST TECHNIQUE: Multidetector CT imaging of the abdomen and pelvis was performed following the standard protocol without IV contrast. COMPARISON:  March 24, 2018 FINDINGS: Lower chest: Cardiomegaly.  No other abnormalities. Hepatobiliary: Multiple low-attenuation lesions in the liver are most likely cysts, unchanged. The liver and gallbladder are otherwise unremarkable. Pancreas:  Unremarkable. No pancreatic ductal dilatation or surrounding inflammatory changes. Spleen: Normal in size without focal abnormality. Adrenals/Urinary Tract: Adrenal glands are normal. A 4.1 cm apparently solid mass is seen in the upper pole of the left kidney measuring 3.9 cm previously. A 2.7 cm mass in the medial left kidney on series 2, image 28 is essentially unchanged and nonspecific. Several renal cysts are noted. There is a solid and cystic appearing mass in the lower pole of the right kidney difficult to compare to the previous study due to lack of contrast today. However, the maximum measurement is approximately 4.6 cm, unchanged. Several probable hyperdense cysts are noted. The bladder is decompressed with a Foley catheter. There is mild fat stranding adjacent to the bladder which is a new finding. No renal stones or hydronephrosis. No ureteral stones. Stomach/Bowel: The stomach and small bowel are normal. The patient is status post partial right colectomy scattered colonic diverticuli are noted. There is a new ventral hernia, located superior to the previously identified umbilical hernia. This is best seen on series 2, image 35. There is fat stranding within the hernia which likely contains omentum. It also contains a portion of transverse colon which does not appear to be inflamed. The wall of the hernia is thickened and inflamed. Surgical changes are seen in the right lower abdomen at the site of right colectomy. There is some increased attenuation in the surrounding fat, likely postoperative. No collection to suggest abscess or leak. The appendix is been removed along with the right partial colectomy. Vascular/Lymphatic: Mild atherosclerotic changes in the aorta. No obvious adenopathy. Reproductive: Again noted is an enlarged prostate exerting mass effect on the posterior bladder. Other:  No abdominal wall hernia or abnormality. No abdominopelvic ascites. Musculoskeletal: No acute or significant osseous  findings. IMPRESSION: 1. At least 2 renal masses concerning for renal cell carcinoma. The masses were better assessed with a previous contrast-enhanced study. Recommend an MRI when the patient is able. 2. There is a new ventral hernia. The periphery of the hernia/hernia wall is thickened and inflamed. The hernia contains transverse colon which does not appear to be inflamed or obstructed. There is also likely some herniated omentum with associated fat stranding. This hernia is new compared to March 24, 2018. No associated bowel obstruction. 3. Inflammatory changes in the right lower quadrant, surrounding the site of right partial colectomy. These findings are likely postsurgical as surgery was only just over 1 week ago. No evidence of leak or abscess. 4. Atherosclerotic changes in the abdominal aorta. Electronically Signed   By: Dorise Bullion III M.D   On: 05/30/2018 12:24   Dg Chest 2 View  Result Date: 06/05/2018 CLINICAL DATA:  Shortness of breath. EXAM: CHEST - 2 VIEW COMPARISON:  Chest CT 05/19/2018 FINDINGS: Chronic cardiomegaly and aortic tortuosity. No pulmonary edema, pleural effusion, focal airspace disease or pneumothorax. No acute osseous abnormalities. IMPRESSION: Cardiomegaly without congestive failure or acute chest finding. Electronically Signed   By: Keith Rake M.D.   On: 06/05/2018 03:21   US Renal  Result Date: 06/06/2018 CLINICAL DATA:  Acute kidney injury, history coronary artery disease, chronic diastolic CHF, COPD, type II diabetes mellitus, hypertension, hypertensive heart disease, prostate cancer, renal cell carcinoma EXAM: RENAL / URINARY TRACT ULTRASOUND COMPLETE COMPARISON:  CT abdomen and pelvis 06/05/2017 FINDINGS: Right Kidney: Length: 14.5 cm. Normal cortical thickness. Upper normal echogenicity. Cystic lesion at mid kidney, 2.7 x 2.2 x 3.0 cm, containing a few scattered low level internal echoes; this lesion is hyperdense on the prior CT exam. An additional nodule at  the inferior pole measures 3.9 x 2.0 x 2.7 cm and demonstrates multiple septations and low-level scattered internal echogenicity. This lesion is mildly complicated and demonstrated low-attenuation and high attenuation components on the prior CT. No hydronephrosis or shadowing calcification. Left Kidney: Length: 16.7 cm. Normal cortical thickness. Minimally increased cortical echogenicity. Small simple cyst at inferior pole, 3.9 x 2.0 x 2.7 cm. Additional complicated cyst at upper pole 2.5 x 2.1 x 2.9 cm with a scattered internal echoes. Additional mass at upper pole appear solid, 4.9 x 4.5 x 4.5 cm, highly suspicious for a renal neoplasm. No hydronephrosis or shadowing calcification. Bladder: Normal appearing bladder. Marked prostatic enlargement, 9.7 x 4.5 x 6.8 cm IMPRESSION: Probable medical renal disease changes of the kidneys. BILATERAL simple renal cysts. Additional solid mass at upper pole LEFT kidney 4.9 cm greatest size most likely representing a solid renal neoplasm, demonstrated enhancement on a prior CT exam of 03/24/2018 and now demonstrates an interval increase in size. Additional complicated cystic lesions at the inferior pole of RIGHT kidney and upper pole LEFT kidney, indeterminate, cannot exclude renal neoplasms; consider MR characterization. Marked prostatic enlargement. Electronically Signed   By: Lavonia Dana M.D.   On: 06/06/2018 17:55   Dg Bone Survey Met  Result Date: 06/10/2018 CLINICAL DATA:  Multiple myeloma EXAM: METASTATIC BONE SURVEY COMPARISON:  None. FINDINGS: Metastatic bone survey was performed. 1. Cervical Spine: Degenerative disc disease C3 through C7. No lytic abnormality. 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 2. Both shoulders: Mild osteoarthritis of the acromioclavicular and glenohumeral joints bilaterally. No lytic abnormality or fracture. No joint dislocation. 3. Humeri: No  lytic abnormality. Mild degenerative subcortical cystic change of the right  humeral head adjacent to the greater tuberosity. 4. Bilateral forearms: No lytic abnormality of the radius or ulna on either side. Mild degenerative cystic change of the left scaphoid. 5. Lateral skull: Negative aggressive osteolytic lesions. 6. Pain and lateral thoracic spine: Multilevel degenerative disc disease and endplate spurring. Patchy ill-defined appearance of the mid to lower thoracic vertebral bodies. Cannot exclude small lytic foci. Dedicated CT of the thoracic spine may help for better assessment. 7. Lumbar spine: Mild degenerative change with disc flattening and endplate spurring. No aggressive lytic abnormalities. 8. Chest: Cardiomegaly. Aortic atherosclerosis. Clear lungs. No definite lytic abnormality of the visualized ribs. 9. AP pelvis: No fracture nor aggressive lytic abnormality noted. 10. Femora: Negative for lytic abnormality. 11. Tibia and fibula: Negative bilaterally lytic abnormality. IMPRESSION: Slightly patchy ill-defined appearance of the thoracic spine some which is due to overlap of adjacent ribs and lung limiting assessment. The possibility of subtle lytic abnormalities is not excluded as a result. CT may help for better assessment. Otherwise, no aggressive nor suspicious lytic abnormalities are identified. Electronically Signed   By: Ashley Royalty M.D.   On: 06/10/2018 23:57   Ct Bone Marrow Biopsy & Aspiration  Result Date: 06/12/2018 INDICATION: 72 year old with anemia and evaluate for light chain myeloma. EXAM: CT GUIDED BONE MARROW ASPIRATES AND BIOPSY Physician: Stephan Minister. Anselm Pancoast, MD MEDICATIONS: No antibiotics ANESTHESIA/SEDATION: Fentanyl 25 mcg IV; Versed 0.5 mg IV Moderate Sedation Time: The patient was continuously monitored during the procedure by the interventional radiology nurse under my direct supervision. COMPLICATIONS: None immediate. PROCEDURE: The procedure was explained to the patient. The risks and benefits of the procedure were discussed and the patient's  questions were addressed. Informed consent was obtained from the patient. The patient was placed prone on CT table. Images of the pelvis were obtained. The back was prepped and draped in sterile fashion. Maximal barrier sterile technique was utilized including caps, mask, sterile gowns, sterile gloves, sterile drape, hand hygiene and skin antiseptic. The skin and right posterior ilium were anesthetized with 1% lidocaine. 11 gauge bone needle was directed into the right ilium with CT guidance. Two aspirates and one core biopsy were obtained. Bandage placed over the puncture site. FINDINGS: Bone needle was directed into the posterior right ilium. Enlarged prostate. IMPRESSION: CT guided bone marrow aspiration and core biopsy. Electronically Signed   By: Markus Daft M.D.   On: 06/12/2018 19:56   Ct Angio Chest Aorta W &/or Wo Contrast  Result Date: 05/19/2018 CLINICAL DATA:  Yearly follow up thoracic aortic aneurysm. Hx of recent syncopal episodes in [place] and post prandial. EXAM: CT ANGIOGRAPHY CHEST WITH CONTRAST TECHNIQUE: Multidetector CT imaging of the chest was performed using the standard protocol during bolus administration of intravenous contrast. Multiplanar CT image reconstructions and MIPs were obtained to evaluate the vascular anatomy. CONTRAST:  120m ISOVUE-370 IOPAMIDOL (ISOVUE-370) INJECTION 76% COMPARISON:  05/30/2017 and previous FINDINGS: Cardiovascular: Cardiomegaly. No pericardial effusion. Dilated central pulmonary arteries. Fair contrast opacification of pulmonary artery branches; the exam was not optimized for detection of pulmonary emboli. Mild scattered coronary calcifications. Good contrast opacification of the thoracic aorta without dissection or stenosis. Transverse diameters as follows: 5.2 cm sinuses of Valsalva 4.2 cm  sino-tubular junction 4.6 cm mid ascending (previously 4.3 cm) 4.3 cm distal ascending/proximal arch 3.4 cm distal arch 3.2 cm proximal descending 2.8 cm distal  descending Bovine variant brachiocephalic arterial origin anatomy without proximal stenosis. Visualized proximal abdominal aorta  unremarkable. Ectatic distal celiac axis up to 1.5 cm diameter. Mediastinum/Nodes: No hilar or mediastinal adenopathy. Lungs/Pleura: No pleural effusion.  No pneumothorax.  Lungs clear. Upper Abdomen: Nodular liver contour. Stable hepatic cysts. Exophytic 5.8 cm upper pole left renal lesion measuring above fluid attenuation. No acute findings. Musculoskeletal: 5 mm subcutaneous metallic density in the left anterior chest wall subcutaneous tissues. Mid and lower thoracic spondylitic change. No fracture or worrisome bone lesion. Review of the MIP images confirms the above findings. IMPRESSION: 1. 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 Recommend semi-annual imaging followup by CTA or MRA and referral to cardiothoracic surgery if not already obtained. This recommendation follows 2010 ACCF/AHA/AATS/ACR/ASA/SCA/SCAI/SIR/STS/SVM Guidelines for the Diagnosis and Management of Patients With Thoracic Aortic Disease. Circulation. 2010; 121: M147-W929 2. 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 Recommend dedicated renal protocol CT or MR with contrast. 3. Coronary calcifications. The severity of coronary artery disease and any potential stenosis cannot be assessed on this non-gated CT examination. Assessment for potential risk factor modification, dietary therapy or pharmacologic therapy may be warranted, if clinically indicated. 4. Nodular liver contour suggesting cirrhosis. Electronically Signed   By: Lucrezia Europe M.D.   On: 05/19/2018 13:45     LOS: 8 days   Oren Binet, MD  [facility]  If 7PM-7AM, please contact night-coverage  Please page via [email or web address]-click on MD name and type text message  06/13/2018, 1:13  PM

## 2018-06-13 NOTE — Progress Notes (Signed)
Oncology short note  Discussed wth Dr Sloan Leiter. Awaiting BM Bx results - done today. Recommended starting Dexamethasone 20mg  po daily x 4 days to try to reduce light chain production since sepsis is now controlled. Will defer to nephrology regarding candidacy and ?benefit of plasmapheresis for light chain clearance. Definitive treatment will be based on BM Bx results. Will f/u on Monday.  Sullivan Lone MD MS

## 2018-06-14 LAB — RENAL FUNCTION PANEL
Albumin: 3.2 g/dL — ABNORMAL LOW (ref 3.5–5.0)
Anion gap: 13 (ref 5–15)
BUN: 69 mg/dL — AB (ref 8–23)
CHLORIDE: 101 mmol/L (ref 98–111)
CO2: 23 mmol/L (ref 22–32)
Calcium: 9.1 mg/dL (ref 8.9–10.3)
Creatinine, Ser: 5.3 mg/dL — ABNORMAL HIGH (ref 0.61–1.24)
GFR calc Af Amer: 11 mL/min — ABNORMAL LOW (ref >60)
GFR calc non Af Amer: 10 mL/min — ABNORMAL LOW (ref >60)
GLUCOSE: 127 mg/dL — AB (ref 70–99)
PHOSPHORUS: 4.7 mg/dL — AB (ref 2.5–4.6)
POTASSIUM: 4.1 mmol/L (ref 3.5–5.1)
Sodium: 137 mmol/L (ref 135–145)

## 2018-06-14 NOTE — Progress Notes (Signed)
PROGRESS NOTE        PATIENT DETAILS Name: [name] [name] Age: 72 y.o. Sex: male Date of Birth: 1946-06-08 Admit Date: 06/05/2018 Admitting Physician Albertine Patricia, MD [id number], Christean Grief, MD  Brief Narrative: Patient is a 72 y.o. male with history of CAD, hypertension, PAF not on anticoagulation-recent right hemicolectomy for a cecal mass-just discharged by the general surgical service on 2/87-[facility] course was complicated by development of obstructive uropathy and AKI ( discharged with a Foley catheter, successful outpatient voiding trial on 10/1)-presented to the hospital with sepsis secondary to Acinetobacter bacteremia, acute kidney injury.  Further work-up demonstrates significantly elevated serum kappa light chains, mildly positive ANCA serology.  See below for further details  Subjective:  Patient in bed, appears comfortable, denies any headache, no fever, no chest pain or pressure, no shortness of breath , no abdominal pain. No focal weakness.   Assessment/Plan:  Sepsis secondary to Acinetobacter bacteremia along with Acinetobacter and enterococcal UTI: Secondary to recent hospitalization for laparotomy/recent Foley catheter placement-sepsis pathophysiology has resolved-remains on IV Unasyn-stop date of 10/12.  Repeat cultures remain negative continue Unasyn and monitor clinically.  Sepsis pathophysiology has completely resolved.  AKI on CKD stage III: Initially thought to have hemodynamically mediated kidney injury with some contribution from obstructive uropathy.  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  Serum creatinine along with urine output remains stable, post void bladder scans are stable as well, case discussed with nephrologist Dr. Joelyn Oms bedside on 06/14/2018.  Monitor for bone marrow biopsy results, for now no indication for dialysis/plasmapheresis.  Remains on Decadron which  will be continued.  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  Oncology suspecting light chain myeloma-bone survey negative-underwent bone marrow biopsy on 10/11.  Discussed with Dr Pollyann Samples that sepsis is significantly better-we will start Decadron milligrams daily from 10/12 for 4 doses.  Once bone marrow biopsy is back-oncology will delineate further plans.  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  Required second unit of PRBC transfusion on 10/11-hemoglobin stable posttransfusion-follow CBC periodically.   Hypokalemia: Repleted  BPH with recent urinary retention/obstructive uropathy requiring Foley catheter placement: Foley catheter was inserted on 10/5 due to worsening renal function and concern for obstructive uropathy contributing to AKI-with improving renal function-and leaking Foley catheter-this catheter was discontinued on 10/9.  Continue to monitor closely-with frequent bladder scans.  Continue Flomax and Proscar.   Chronic diastolic heart failure: Remains euvolemic-follow volume status closely.  Hypertension: BP controlled-continue amlodipine.  CAD: No anginal symptoms-follow.  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  Questionable renal cell carcinoma on CT scan.  Outpatient urology follow-up post discharge.    DVT Prophylaxis: Prophylactic Heparin   Code Status: Full code  Family Communication: None at bedside  Disposition Plan: Remain inpatient-probably require several more days of hospitalization.   Antimicrobial agents: Anti-infectives (From admission, onward)   Start     Dose/Rate Route Frequency Ordered Stop   06/08/18 1200  Ampicillin-Sulbactam (UNASYN) 3 g in sodium chloride  0.9 % 100 mL IVPB     3 g 200 mL/hr over 30 Minutes Intravenous Every 12 hours 06/08/18 1051 06/13/18 2359  06/08/18 1100  Ampicillin-Sulbactam (UNASYN) 3 g in sodium chloride 0.9 % 100 mL IVPB  Status:  Discontinued     3 g 200 mL/hr over 30 Minutes Intravenous Every 24 hours 06/08/18 1049 06/08/18 1051   06/08/18 1000  meropenem (MERREM) 1 g in sodium chloride 0.9 % 100 mL IVPB  Status:  Discontinued     1 g 200 mL/hr over 30 Minutes Intravenous Every 24 hours 06/07/18 1303 06/08/18 1026   06/06/18 1345  meropenem (MERREM) 1 g in sodium chloride 0.9 % 100 mL IVPB  Status:  Discontinued     1 g 200 mL/hr over 30 Minutes Intravenous Every 12 hours 06/06/18 1333 06/07/18 1303   06/06/18 0800  ceFEPIme (MAXIPIME) 1 g in sodium chloride 0.9 % 100 mL IVPB  Status:  Discontinued     1 g 200 mL/hr over 30 Minutes Intravenous Every 24 hours 06/05/18 1056 06/06/18 1333   06/05/18 0900  vancomycin (VANCOCIN) 2,000 mg in sodium chloride 0.9 % 500 mL IVPB     2,000 mg 250 mL/hr over 120 Minutes Intravenous  Once 06/05/18 0816 06/05/18 1149   06/05/18 0730  ceFEPIme (MAXIPIME) 2 g in sodium chloride 0.9 % 100 mL IVPB     2 g 200 mL/hr over 30 Minutes Intravenous  Once 06/05/18 0720 06/05/18 0833   06/05/18 0730  metroNIDAZOLE (FLAGYL) IVPB 500 mg  Status:  Discontinued     500 mg 100 mL/hr over 60 Minutes Intravenous Every 8 hours 06/05/18 0720 06/05/18 1035   06/05/18 0730  vancomycin (VANCOCIN) IVPB 1000 mg/200 mL premix  Status:  Discontinued     1,000 mg 200 mL/hr over 60 Minutes Intravenous  Once 06/05/18 0720 06/05/18 0816      Procedures: None  CONSULTS:  None  Time spent: 25 minutes-Greater than 50% of this time was spent in counseling, explanation of diagnosis, planning of further management, and coordination of care.  MEDICATIONS: Scheduled Meds: . sodium chloride   Intravenous Once  . amLODipine  5 mg Oral Daily  . dexamethasone  20 mg Oral Daily  . finasteride  5 mg Oral  Daily  . heparin  5,000 Units Subcutaneous Q8H  . pantoprazole  40 mg Oral Q1200  . tamsulosin  0.4 mg Oral QHS   Continuous Infusions:  PRN Meds:.acetaminophen **OR** acetaminophen, albuterol, HYDROcodone-acetaminophen, metoprolol tartrate, ondansetron **OR** ondansetron (ZOFRAN) IV, senna-docusate, sodium chloride   PHYSICAL EXAM: Vital signs: Vitals:   06/13/18 2028 06/14/18 0621 06/14/18 0847 06/14/18 0853  BP: (!) 161/88 (!) 161/80 (!) 149/84   Pulse: 62 (!) 51 61   Resp: 18 18    Temp: 97.9 F (36.6 C) 97.9 F (36.6 C)    TempSrc:      SpO2: 99% 97%    Weight:    99.9 kg  Height:       Filed Weights   06/11/18 1700 06/13/18 0432 06/14/18 0853  Weight: 101.4 kg 102.7 kg 99.9 kg   Body mass index is 35.54 kg/m.   Exam  Awake Alert, Oriented X 3, No new F.N deficits, Normal affect Deepstep.AT,PERRAL Supple Neck,No JVD, No cervical lymphadenopathy appriciated.  Symmetrical Chest wall movement, Good air movement bilaterally, CTAB RRR,No Gallops, Rubs or new Murmurs, No Parasternal Heave +ve B.Sounds, Abd Soft, No tenderness, No organomegaly appriciated, No rebound - guarding or rigidity. No Cyanosis, Clubbing or edema, No new Rash or bruise   I have personally reviewed following labs and imaging studies  LABORATORY DATA: CBC: Recent Labs  Lab 06/09/18 1019 06/10/18 0337 06/10/18 1605 06/11/18 0324 06/12/18 0408 06/13/18 0705  WBC 6.5 7.5  --  7.4 6.4 6.1  NEUTROABS  --   --   --   --  4.0  --   HGB 7.2* 6.8* 8.1* 8.3* 7.4* 8.5*  HCT 23.0* 21.8* 26.0* 25.4* 23.7* 25.7*  MCV 100.4* 98.6  --  97.3 96.7 94.8  PLT 267 341  --  383 418* 492    Basic Metabolic Panel: Recent Labs  Lab 06/10/18 0337 06/11/18 0324 06/12/18 0408 06/13/18 0303 06/14/18 0505  NA 138 138 138 139 137  K 3.6 3.6 3.4* 3.7 4.1  CL 105 103 101 103 101  CO2 _0 GLUCOSE 93 91 88 97 127*  BUN 63* 60* 62* 59* 69*  CREATININE 5.77* 5.50* 5.22* 5.22* 5.30*  CALCIUM 8.2*  8.5* 8.5* 8.6* 9.1  PHOS 5.0* 5.0* 5.2* 5.0* 4.7*    GFR: Estimated Creatinine Clearance: 13.5 mL/min (A) (by C-G formula based on SCr of 5.3 mg/dL (H)).  Liver Function Tests: Recent Labs  Lab 06/10/18 0337 06/11/18 0324 06/12/18 0408 06/13/18 0303 06/14/18 0505  ALBUMIN 2.8* 3.0* 3.1* 2.9* 3.2*   No results for input(s): LIPASE, AMYLASE in the last 168 hours. No results for input(s): AMMONIA in the last 168 hours.  Coagulation Profile: Recent Labs  Lab 06/12/18 0408  INR 1.06    Cardiac Enzymes: No results for input(s): CKTOTAL, CKMB, CKMBINDEX, TROPONINI in the last 168 hours.  BNP (last 3 results) No results for input(s): PROBNP in the last 8760 hours.  HbA1C: No results for input(s): HGBA1C in the last 72 hours.  CBG: Recent Labs  Lab 06/09/18 1652 06/10/18 0852 06/12/18 0432 06/12/18 0819 06/12/18 2123  GLUCAP 124* 87 86 84 93    Lipid Profile: No results for input(s): CHOL, HDL, LDLCALC, TRIG, CHOLHDL, LDLDIRECT in the last 72 hours.  Thyroid Function Tests: No results for input(s): TSH, T4TOTAL, FREET4, T3FREE, THYROIDAB in the last 72 hours.  Anemia Panel: No results for input(s): VITAMINB12, FOLATE, FERRITIN, TIBC, IRON, RETICCTPCT in the last 72 hours.  Urine analysis:    Component Value Date/Time   COLORURINE STRAW (A) 06/07/2018 1643   APPEARANCEUR CLEAR 06/07/2018 1643   LABSPEC 1.008 06/07/2018 1643   PHURINE 6.0 06/07/2018 1643   GLUCOSEU NEGATIVE 06/07/2018 1643   HGBUR LARGE (A) 06/07/2018 1643   BILIRUBINUR NEGATIVE 06/07/2018 1643   KETONESUR NEGATIVE 06/07/2018 1643   PROTEINUR 30 (A) 06/07/2018 1643   NITRITE NEGATIVE 06/07/2018 1643   LEUKOCYTESUR MODERATE (A) 06/07/2018 1643    Sepsis Labs: Lactic Acid, Venous    Component Value Date/Time   LATICACIDVEN 1.63 06/05/2018 0310    MICROBIOLOGY: Recent Results (from the past 240 hour(s))  Blood culture (routine x 2)     Status: Abnormal (Preliminary result)    Collection Time: 06/05/18  2:52 AM  Result Value Ref Range Status   Specimen Description BLOOD LEFT HAND  Final   Special Requests   Final    BOTTLES DRAWN AEROBIC AND ANAEROBIC Blood Culture adequate volume   Culture  Setup Time   Final    IN BOTH AEROBIC AND ANAEROBIC BOTTLES GRAM NEGATIVE RODS CRITICAL RESULT CALLED TO, READ BACK BY AND VERIFIED WITH: L SEAY PHARMD 06/06/18 1225 JDW    Culture (A)  Final    ACINETOBACTER CALCOACETICUS/BAUMANNII COMPLEX Sent to [place] for further susceptibility testing. Performed at [facility], [place],  [place] [address]    Report Status PENDING  Incomplete   Organism ID, Bacteria ACINETOBACTER CALCOACETICUS/BAUMANNII COMPLEX  Final      Susceptibility   Acinetobacter calcoaceticus/baumannii complex - MIC*    CEFTAZIDIME 8 SENSITIVE Sensitive     CEFTRIAXONE 16 INTERMEDIATE Intermediate     CIPROFLOXACIN 0.5 SENSITIVE Sensitive     GENTAMICIN 4 SENSITIVE Sensitive     IMIPENEM <=0.25 SENSITIVE Sensitive     PIP/TAZO <=4 SENSITIVE Sensitive     TRIMETH/SULFA <=20 SENSITIVE Sensitive     CEFEPIME 8 SENSITIVE Sensitive     AMPICILLIN/SULBACTAM <=2 SENSITIVE Sensitive     * ACINETOBACTER CALCOACETICUS/BAUMANNII COMPLEX  Blood Culture ID Panel (Reflexed)     Status: None   Collection Time: 06/05/18  2:52 AM  Result Value Ref Range Status   Enterococcus species NOT DETECTED NOT DETECTED Final    Comment: CRITICAL RESULT CALLED TO, READ BACK BY AND VERIFIED WITH: L SEAY PHARMD 06/06/18 1225 JDW    Listeria monocytogenes NOT DETECTED NOT DETECTED Final   Staphylococcus species NOT DETECTED NOT DETECTED Final   Staphylococcus aureus (BCID) NOT DETECTED NOT DETECTED Final   Streptococcus species NOT DETECTED NOT DETECTED Final   Streptococcus agalactiae NOT DETECTED NOT DETECTED Final   Streptococcus pneumoniae NOT DETECTED NOT DETECTED Final   Streptococcus pyogenes NOT DETECTED NOT DETECTED Final   Acinetobacter baumannii  NOT DETECTED NOT DETECTED Final   Enterobacteriaceae species NOT DETECTED NOT DETECTED Final   Enterobacter cloacae complex NOT DETECTED NOT DETECTED Final   Escherichia coli NOT DETECTED NOT DETECTED Final   Klebsiella oxytoca NOT DETECTED NOT DETECTED Final   Klebsiella pneumoniae NOT DETECTED NOT DETECTED Final   Proteus species NOT DETECTED NOT DETECTED Final   Serratia marcescens NOT DETECTED NOT DETECTED Final   Haemophilus influenzae NOT DETECTED NOT DETECTED Final   Neisseria meningitidis NOT DETECTED NOT DETECTED Final   Pseudomonas aeruginosa NOT DETECTED NOT DETECTED Final   Candida albicans NOT DETECTED NOT DETECTED Final   Candida glabrata NOT DETECTED NOT DETECTED Final   Candida krusei NOT DETECTED NOT DETECTED Final   Candida parapsilosis NOT DETECTED NOT DETECTED Final   Candida tropicalis NOT DETECTED NOT DETECTED Final    Comment: Performed at [facility], [place] [address] [place], [place] [address]  Blood culture (routine x 2)     Status: None   Collection Time: 06/05/18  3:00 AM  Result Value Ref Range Status   Specimen Description BLOOD RIGHT ANTECUBITAL  Final   Special Requests   Final    BOTTLES DRAWN AEROBIC AND ANAEROBIC Blood Culture adequate volume   Culture   Final    NO GROWTH 5 DAYS Performed at [facility], [place] [address], [place], [place] [address]    Report Status 06/10/2018 FINAL  Final  Culture, Urine     Status: Abnormal   Collection Time: 06/05/18  8:00 AM  Result Value Ref Range Status   Specimen Description URINE, RANDOM  Final   Special Requests   Final    NONE Performed at [facility], [place] [address], [place], [place] [address]    Culture (A)  Final    >=100,000 COLONIES/mL ENTEROCOCCUS FAECALIS 10,000 COLONIES/mL ACINETOBACTER CALCOACETICUS/BAUMANNII COMPLEX    Report Status 06/08/2018 FINAL  Final   Organism ID, Bacteria ENTEROCOCCUS FAECALIS (A)  Final   Organism ID, Bacteria ACINETOBACTER  CALCOACETICUS/BAUMANNII COMPLEX (A)  Final      Susceptibility   Acinetobacter calcoaceticus/baumannii complex -  MIC*    CEFTAZIDIME 8 SENSITIVE Sensitive     CEFTRIAXONE 16 INTERMEDIATE Intermediate     CIPROFLOXACIN 1 SENSITIVE Sensitive     GENTAMICIN 4 SENSITIVE Sensitive     IMIPENEM <=0.25 SENSITIVE Sensitive     PIP/TAZO >=128 RESISTANT Resistant     TRIMETH/SULFA <=20 SENSITIVE Sensitive     CEFEPIME 8 SENSITIVE Sensitive     AMPICILLIN/SULBACTAM <=2 SENSITIVE Sensitive     * 10,000 COLONIES/mL ACINETOBACTER CALCOACETICUS/BAUMANNII COMPLEX   Enterococcus faecalis - MIC*    AMPICILLIN <=2 SENSITIVE Sensitive     LEVOFLOXACIN 1 SENSITIVE Sensitive     NITROFURANTOIN <=16 SENSITIVE Sensitive     VANCOMYCIN 1 SENSITIVE Sensitive     * >=100,000 COLONIES/mL ENTEROCOCCUS FAECALIS  MRSA PCR Screening     Status: None   Collection Time: 06/05/18  1:04 PM  Result Value Ref Range Status   MRSA by PCR NEGATIVE NEGATIVE Final    Comment:        The GeneXpert MRSA Assay (FDA approved for NASAL specimens only), is one component of a comprehensive MRSA colonization surveillance program. It is not intended to diagnose MRSA infection nor to guide or monitor treatment for MRSA infections. Performed at [facility], [place] [address] [place], [place] [address]   Culture, blood (routine x 2)     Status: None (Preliminary result)   Collection Time: 06/10/18  4:50 PM  Result Value Ref Range Status   Specimen Description BLOOD LEFT HAND  Final   Special Requests   Final    BOTTLES DRAWN AEROBIC ONLY Blood Culture adequate volume   Culture   Final    NO GROWTH 4 DAYS Performed at [facility], [place] [address], [place], [place] [address]    Report Status PENDING  Incomplete  Culture, blood (routine x 2)     Status: None (Preliminary result)   Collection Time: 06/10/18  5:00 PM  Result Value Ref Range Status   Specimen Description BLOOD RIGHT ANTECUBITAL  Final   Special  Requests   Final    BOTTLES DRAWN AEROBIC ONLY Blood Culture adequate volume   Culture   Final    NO GROWTH 4 DAYS Performed at [facility], [place] [address] [place], [place] [address]    Report Status PENDING  Incomplete    RADIOLOGY STUDIES/RESULTS: Ct Abdomen Pelvis Wo Contrast  Result Date: 06/05/2018 CLINICAL DATA:  Abdominal pain, fever.  Recent partial colectomy. EXAM: CT ABDOMEN AND PELVIS WITHOUT CONTRAST TECHNIQUE: Multidetector CT imaging of the abdomen and pelvis was performed following the standard protocol without IV contrast. COMPARISON:  05/30/2018 FINDINGS: Lower chest: Cardiomegaly.  Bibasilar atelectasis.  No effusions. Hepatobiliary: Small scattered hepatic cysts, small scattered hepatic low-density lesions throughout the liver, likely cysts although these cannot be characterized fully without intravenous contrast. Gallbladder unremarkable. Pancreas: No focal abnormality or ductal dilatation. Spleen: No focal abnormality.  Normal size. Adrenals/Urinary Tract: Numerous low-density, mixed density and high-density masses throughout the kidney are stable since recent study and cannot be characterized fully without intravenous contrast. At least 2 of these masses were shown on prior contrast enhanced CT to be suspicious for renal cell carcinoma. No hydronephrosis. Adrenal glands unremarkable. Urinary bladder decompressed and mildly thick walled, likely related to prostate enlargement. Stomach/Bowel: Descending colonic and sigmoid diverticulosis. Postoperative changes in the right colon from right hemicolectomy, stable. Again seen is the ventral hernia containing a portion of the mid transverse colon which appears unremarkable. No evidence  of obstruction or bowel wall thickening. Stomach and small bowel grossly unremarkable. No evidence of bowel obstruction. Vascular/Lymphatic: Aortic atherosclerosis. No enlarged abdominal or pelvic lymph nodes. Reproductive: Marked enlargement of the  prostate. Other: No free fluid or free air. Continued wall thickening within the periphery of the midline supraumbilical ventral hernia which also contains the transverse colon. Again, no underlying bowel wall thickening. This has a stable appearance since prior study. Musculoskeletal: No acute bony abnormality. Diffuse degenerative changes in the lumbar spine. IMPRESSION: Stable appearance of prior right hemicolectomy. Stable supraumbilical ventral hernia containing a portion of the transverse colon. The wall of the ventral hernia again appears thickened and indistinct, but underlying bowel does not appear abnormal. Scattered diverticulosis.  No active diverticulitis. Marked prostate enlargement with bladder wall thickening. Multiple renal lesions. At least 2 of which were shown on prior contrast-enhanced CT to be suspicious for renal cell carcinoma. No change. Scattered hepatic low-density lesions cannot be characterized without intravenous contrast, likely cysts. Cardiomegaly, bibasilar atelectasis. Aortic atherosclerosis. Electronically Signed   By: Rolm Baptise M.D.   On: 06/05/2018 08:22   Ct Abdomen Pelvis Wo Contrast  Result Date: 05/30/2018 CLINICAL DATA:  Partial right colectomy, perform laparoscopically. EXAM: CT ABDOMEN AND PELVIS WITHOUT CONTRAST TECHNIQUE: Multidetector CT imaging of the abdomen and pelvis was performed following the standard protocol without IV contrast. COMPARISON:  March 24, 2018 FINDINGS: Lower chest: Cardiomegaly.  No other abnormalities. Hepatobiliary: Multiple low-attenuation lesions in the liver are most likely cysts, unchanged. The liver and gallbladder are otherwise unremarkable. Pancreas: Unremarkable. No pancreatic ductal dilatation or surrounding inflammatory changes. Spleen: Normal in size without focal abnormality. Adrenals/Urinary Tract: Adrenal glands are normal. A 4.1 cm apparently solid mass is seen in the upper pole of the left kidney measuring 3.9 cm previously.  A 2.7 cm mass in the medial left kidney on series 2, image 28 is essentially unchanged and nonspecific. Several renal cysts are noted. There is a solid and cystic appearing mass in the lower pole of the right kidney difficult to compare to the previous study due to lack of contrast today. However, the maximum measurement is approximately 4.6 cm, unchanged. Several probable hyperdense cysts are noted. The bladder is decompressed with a Foley catheter. There is mild fat stranding adjacent to the bladder which is a new finding. No renal stones or hydronephrosis. No ureteral stones. Stomach/Bowel: The stomach and small bowel are normal. The patient is status post partial right colectomy scattered colonic diverticuli are noted. There is a new ventral hernia, located superior to the previously identified umbilical hernia. This is best seen on series 2, image 35. There is fat stranding within the hernia which likely contains omentum. It also contains a portion of transverse colon which does not appear to be inflamed. The wall of the hernia is thickened and inflamed. Surgical changes are seen in the right lower abdomen at the site of right colectomy. There is some increased attenuation in the surrounding fat, likely postoperative. No collection to suggest abscess or leak. The appendix is been removed along with the right partial colectomy. Vascular/Lymphatic: Mild atherosclerotic changes in the aorta. No obvious adenopathy. Reproductive: Again noted is an enlarged prostate exerting mass effect on the posterior bladder. Other: No abdominal wall hernia or abnormality. No abdominopelvic ascites. Musculoskeletal: No acute or significant osseous findings. IMPRESSION: 1. At least 2 renal masses concerning for renal cell carcinoma. The masses were better assessed with a previous contrast-enhanced study. Recommend an MRI when the patient is able.  2. There is a new ventral hernia. The periphery of the hernia/hernia wall is thickened  and inflamed. The hernia contains transverse colon which does not appear to be inflamed or obstructed. There is also likely some herniated omentum with associated fat stranding. This hernia is new compared to March 24, 2018. No associated bowel obstruction. 3. Inflammatory changes in the right lower quadrant, surrounding the site of right partial colectomy. These findings are likely postsurgical as surgery was only just over 1 week ago. No evidence of leak or abscess. 4. Atherosclerotic changes in the abdominal aorta. Electronically Signed   By: Dorise Bullion III M.D   On: 05/30/2018 12:24   Dg Chest 2 View  Result Date: 06/05/2018 CLINICAL DATA:  Shortness of breath. EXAM: CHEST - 2 VIEW COMPARISON:  Chest CT 05/19/2018 FINDINGS: Chronic cardiomegaly and aortic tortuosity. No pulmonary edema, pleural effusion, focal airspace disease or pneumothorax. No acute osseous abnormalities. IMPRESSION: Cardiomegaly without congestive failure or acute chest finding. Electronically Signed   By: Keith Rake M.D.   On: 06/05/2018 03:21   US Renal  Result Date: 06/06/2018 CLINICAL DATA:  Acute kidney injury, history coronary artery disease, chronic diastolic CHF, COPD, type II diabetes mellitus, hypertension, hypertensive heart disease, prostate cancer, renal cell carcinoma EXAM: RENAL / URINARY TRACT ULTRASOUND COMPLETE COMPARISON:  CT abdomen and pelvis 06/05/2017 FINDINGS: Right Kidney: Length: 14.5 cm. Normal cortical thickness. Upper normal echogenicity. Cystic lesion at mid kidney, 2.7 x 2.2 x 3.0 cm, containing a few scattered low level internal echoes; this lesion is hyperdense on the prior CT exam. An additional nodule at the inferior pole measures 3.9 x 2.0 x 2.7 cm and demonstrates multiple septations and low-level scattered internal echogenicity. This lesion is mildly complicated and demonstrated low-attenuation and high attenuation components on the prior CT. No hydronephrosis or shadowing  calcification. Left Kidney: Length: 16.7 cm. Normal cortical thickness. Minimally increased cortical echogenicity. Small simple cyst at inferior pole, 3.9 x 2.0 x 2.7 cm. Additional complicated cyst at upper pole 2.5 x 2.1 x 2.9 cm with a scattered internal echoes. Additional mass at upper pole appear solid, 4.9 x 4.5 x 4.5 cm, highly suspicious for a renal neoplasm. No hydronephrosis or shadowing calcification. Bladder: Normal appearing bladder. Marked prostatic enlargement, 9.7 x 4.5 x 6.8 cm IMPRESSION: Probable medical renal disease changes of the kidneys. BILATERAL simple renal cysts. Additional solid mass at upper pole LEFT kidney 4.9 cm greatest size most likely representing a solid renal neoplasm, demonstrated enhancement on a prior CT exam of 03/24/2018 and now demonstrates an interval increase in size. Additional complicated cystic lesions at the inferior pole of RIGHT kidney and upper pole LEFT kidney, indeterminate, cannot exclude renal neoplasms; consider MR characterization. Marked prostatic enlargement. Electronically Signed   By: Lavonia Dana M.D.   On: 06/06/2018 17:55   Dg Bone Survey Met  Result Date: 06/10/2018 CLINICAL DATA:  Multiple myeloma EXAM: METASTATIC BONE SURVEY COMPARISON:  None. FINDINGS: Metastatic bone survey was performed. 1. Cervical Spine: Degenerative disc disease C3 through C7. No lytic abnormality. 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 2. Both shoulders: Mild osteoarthritis of the acromioclavicular and glenohumeral joints bilaterally. No lytic abnormality or fracture. No joint dislocation. 3. Humeri: No lytic abnormality. Mild degenerative subcortical cystic change of the right humeral head adjacent to the greater tuberosity. 4. Bilateral forearms: No lytic abnormality of the radius or ulna on either side. Mild degenerative cystic change of the left scaphoid. 5. Lateral skull: Negative aggressive osteolytic lesions.  6. Pain and lateral thoracic  spine: Multilevel degenerative disc disease and endplate spurring. Patchy ill-defined appearance of the mid to lower thoracic vertebral bodies. Cannot exclude small lytic foci. Dedicated CT of the thoracic spine may help for better assessment. 7. Lumbar spine: Mild degenerative change with disc flattening and endplate spurring. No aggressive lytic abnormalities. 8. Chest: Cardiomegaly. Aortic atherosclerosis. Clear lungs. No definite lytic abnormality of the visualized ribs. 9. AP pelvis: No fracture nor aggressive lytic abnormality noted. 10. Femora: Negative for lytic abnormality. 11. Tibia and fibula: Negative bilaterally lytic abnormality. IMPRESSION: Slightly patchy ill-defined appearance of the thoracic spine some which is due to overlap of adjacent ribs and lung limiting assessment. The possibility of subtle lytic abnormalities is not excluded as a result. CT may help for better assessment. Otherwise, no aggressive nor suspicious lytic abnormalities are identified. Electronically Signed   By: Ashley Royalty M.D.   On: 06/10/2018 23:57   Ct Bone Marrow Biopsy & Aspiration  Result Date: 06/12/2018 INDICATION: 72 year old with anemia and evaluate for light chain myeloma. EXAM: CT GUIDED BONE MARROW ASPIRATES AND BIOPSY Physician: Stephan Minister. Anselm Pancoast, MD MEDICATIONS: No antibiotics ANESTHESIA/SEDATION: Fentanyl 25 mcg IV; Versed 0.5 mg IV Moderate Sedation Time: The patient was continuously monitored during the procedure by the interventional radiology nurse under my direct supervision. COMPLICATIONS: None immediate. PROCEDURE: The procedure was explained to the patient. The risks and benefits of the procedure were discussed and the patient's questions were addressed. Informed consent was obtained from the patient. The patient was placed prone on CT table. Images of the pelvis were obtained. The back was prepped and draped in sterile fashion. Maximal barrier sterile technique was utilized including caps, mask, sterile  gowns, sterile gloves, sterile drape, hand hygiene and skin antiseptic. The skin and right posterior ilium were anesthetized with 1% lidocaine. 11 gauge bone needle was directed into the right ilium with CT guidance. Two aspirates and one core biopsy were obtained. Bandage placed over the puncture site. FINDINGS: Bone needle was directed into the posterior right ilium. Enlarged prostate. IMPRESSION: CT guided bone marrow aspiration and core biopsy. Electronically Signed   By: Markus Daft M.D.   On: 06/12/2018 19:56   Ct Angio Chest Aorta W &/or Wo Contrast  Result Date: 05/19/2018 CLINICAL DATA:  Yearly follow up thoracic aortic aneurysm. Hx of recent syncopal episodes in [place] and post prandial. EXAM: CT ANGIOGRAPHY CHEST WITH CONTRAST TECHNIQUE: Multidetector CT imaging of the chest was performed using the standard protocol during bolus administration of intravenous contrast. Multiplanar CT image reconstructions and MIPs were obtained to evaluate the vascular anatomy. CONTRAST:  111m ISOVUE-370 IOPAMIDOL (ISOVUE-370) INJECTION 76% COMPARISON:  05/30/2017 and previous FINDINGS: Cardiovascular: Cardiomegaly. No pericardial effusion. Dilated central pulmonary arteries. Fair contrast opacification of pulmonary artery branches; the exam was not optimized for detection of pulmonary emboli. Mild scattered coronary calcifications. Good contrast opacification of the thoracic aorta without dissection or stenosis. Transverse diameters as follows: 5.2 cm sinuses of Valsalva 4.2 cm  sino-tubular junction 4.6 cm mid ascending (previously 4.3 cm) 4.3 cm distal ascending/proximal arch 3.4 cm distal arch 3.2 cm proximal descending 2.8 cm distal descending Bovine variant brachiocephalic arterial origin anatomy without proximal stenosis. Visualized proximal abdominal aorta unremarkable. Ectatic distal celiac axis up to 1.5 cm diameter. Mediastinum/Nodes: No hilar or mediastinal adenopathy. Lungs/Pleura: No pleural effusion.  No  pneumothorax.  Lungs clear. Upper Abdomen: Nodular liver contour. Stable hepatic cysts. Exophytic 5.8 cm upper pole left renal lesion measuring above fluid attenuation.  No acute findings. Musculoskeletal: 5 mm subcutaneous metallic density in the left anterior chest wall subcutaneous tissues. Mid and lower thoracic spondylitic change. No fracture or worrisome bone lesion. Review of the MIP images confirms the above findings. IMPRESSION: 1. 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 Recommend semi-annual imaging followup by CTA or MRA and referral to cardiothoracic surgery if not already obtained. This recommendation follows 2010 ACCF/AHA/AATS/ACR/ASA/SCA/SCAI/SIR/STS/SVM Guidelines for the Diagnosis and Management of Patients With Thoracic Aortic Disease. Circulation. 2010; 121: W979-Y801 2. 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 Recommend dedicated renal protocol CT or MR with contrast. 3. Coronary calcifications. The severity of coronary artery disease and any potential stenosis cannot be assessed on this non-gated CT examination. Assessment for potential risk factor modification, dietary therapy or pharmacologic therapy may be warranted, if clinically indicated. 4. Nodular liver contour suggesting cirrhosis. Electronically Signed   By: Lucrezia Europe M.D.   On: 05/19/2018 13:45     LOS: 9 days   Signature  Lala Lund M.D on 06/14/2018 at 12:20 PM  To page go to [email or web address] - password [facility]

## 2018-06-14 NOTE — Progress Notes (Signed)
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 sodium chloride   Intravenous Once  . amLODipine  5 mg Oral Daily  . dexamethasone  20 mg Oral Daily  . finasteride  5 mg Oral Daily  . heparin  5,000 Units Subcutaneous Q8H  . pantoprazole  40 mg Oral Q1200  . tamsulosin  0.4 mg Oral QHS   Continuous Infusions:  PRN Meds:.acetaminophen **OR** acetaminophen, albuterol, HYDROcodone-acetaminophen, metoprolol tartrate, ondansetron **OR** ondansetron (ZOFRAN) IV, senna-docusate, sodium chloride  Current Labs: reviewed   Physical Exam:  Blood pressure (!) 149/84, pulse 61, temperature 97.9 F (36.6 C), resp. rate 18, height 5' 6"  (1.676 m), weight 99.9 kg, SpO2 97 %. NAD, lying in bed  RRR nl s1s2 CtAB Trace Eemea No rashes/ Nonfocal, CN2-12 intace EOMI NCAT  A 1. 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  Serologies included weakly increased PR3 ANCA of unclear significance 2. Abnormal SFLC, history of MGUS; elevated Kappa level -- heme following, has outpatient oncologist, BM Bx 10/11, start dexamethasone 10/12; pending results of bone marrow biopsy to start other therapies 3. Weakly positive PR3 with hematuria 4. BPH with retention, on flomax/proscar, Foley removed no evidence of recurrent obstruction 5. Acinetobacter bacteremia and UTI: on unasyn, ID  following 6. Anemia, as required transfusion 7. CAD  8. 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 Patient not a candidate for renal biopsy to identify exact etiologies of renal failure given bilateral suspected renal cell carcinomas . Given his overall status, lack of clarity in etiologies of his renal failure, and concern that he would effectively follow through I do not think he is a candidate for therapeutic pheresis . He will need to follow closely with oncology and initiate therapy as directed by work-up . We will continue to follow along . Daily weights, Daily Renal Panel, Strict I/Os, Avoid nephrotoxins (NSAIDs, judicious IV Contrast)   [name] MD 06/14/2018, 11:47 AM  Recent Labs  Lab 06/12/18 0408 06/13/18 0303 06/14/18 0505  NA 138 139 137  K 3.4* 3.7 4.1  CL 101 103 101  CO2 23 25 23   GLUCOSE 88 97 127*  BUN 62* 59* 69*  CREATININE 5.22* 5.22* 5.30*  CALCIUM 8.5* 8.6* 9.1  PHOS 5.2* 5.0* 4.7*   Recent Labs  Lab 06/11/18 0324 06/12/18 0408 06/13/18 0705  WBC 7.4 6.4 6.1  NEUTROABS  --  4.0  --   HGB 8.3* 7.4* 8.5*  HCT 25.4* 23.7* 25.7*  MCV 97.3 96.7 94.8  PLT 383 418* 392

## 2018-06-15 LAB — GLUCOSE, CAPILLARY
GLUCOSE-CAPILLARY: 100 mg/dL — AB (ref 70–99)
Glucose-Capillary: 163 mg/dL — ABNORMAL HIGH (ref 70–99)

## 2018-06-15 LAB — BASIC METABOLIC PANEL
Anion gap: 14 (ref 5–15)
BUN: 83 mg/dL — ABNORMAL HIGH (ref 8–23)
CALCIUM: 9.2 mg/dL (ref 8.9–10.3)
CO2: 24 mmol/L (ref 22–32)
CREATININE: 5.33 mg/dL — AB (ref 0.61–1.24)
Chloride: 99 mmol/L (ref 98–111)
GFR calc Af Amer: 11 mL/min — ABNORMAL LOW (ref >60)
GFR, EST NON AFRICAN AMERICAN: 10 mL/min — AB (ref >60)
Glucose, Bld: 133 mg/dL — ABNORMAL HIGH (ref 70–99)
POTASSIUM: 4.1 mmol/L (ref 3.5–5.1)
SODIUM: 137 mmol/L (ref 135–145)

## 2018-06-15 LAB — KAPPA/LAMBDA LIGHT CHAINS
KAPPA, LAMDA LIGHT CHAIN RATIO: 270.1 — AB (ref 0.26–1.65)
Kappa free light chain: 8319 mg/L — ABNORMAL HIGH (ref 3.3–19.4)
LAMDA FREE LIGHT CHAINS: 30.8 mg/L — AB (ref 5.7–26.3)

## 2018-06-15 MED ORDER — HYDRALAZINE HCL 50 MG PO TABS
50.0000 mg | ORAL_TABLET | Freq: Three times a day (TID) | ORAL | Status: DC
  Administered 2018-06-15 – 2018-06-17 (×6): 50 mg via ORAL
  Filled 2018-06-15 (×8): qty 1

## 2018-06-15 MED ORDER — AMLODIPINE BESYLATE 10 MG PO TABS
10.0000 mg | ORAL_TABLET | Freq: Every day | ORAL | Status: DC
  Administered 2018-06-15 – 2018-06-16 (×2): 10 mg via ORAL
  Filled 2018-06-15 (×3): qty 1

## 2018-06-15 MED ORDER — HYDRALAZINE HCL 20 MG/ML IJ SOLN: 10.0000 mg | Freq: Four times a day (QID) | INTRAMUSCULAR | Status: DC | PRN

## 2018-06-15 NOTE — Progress Notes (Signed)
PROGRESS NOTE        PATIENT DETAILS Name: [name] [name] Age: 72 y.o. Sex: male Date of Birth: 21-May-1946 Admit Date: 06/05/2018 Admitting Physician Albertine Patricia, MD XAJ:OINOMVE, Christean Grief, MD  Brief Narrative: Patient is a 72 y.o. male with history of CAD, hypertension, PAF not on anticoagulation-recent right hemicolectomy for a cecal mass-just discharged by the general surgical service on 7/20-[id number] course was complicated by development of obstructive uropathy and AKI ( discharged with a Foley catheter, successful outpatient voiding trial on 10/1)-presented to the hospital with sepsis secondary to Acinetobacter bacteremia, acute kidney injury.  Further work-up demonstrates significantly elevated serum kappa light chains, mildly positive ANCA serology.  See below for further details  Subjective:  Patient in bed, appears comfortable, denies any headache, no fever, no chest pain or pressure, no shortness of breath , no abdominal pain. No focal weakness.    Assessment/Plan:  Sepsis secondary to Acinetobacter bacteremia along with Acinetobacter and enterococcal UTI: Secondary to recent hospitalization for laparotomy/recent Foley catheter placement-sepsis pathophysiology has resolved-remains on IV Unasyn-stop date of 10/12.  Repeat cultures remain negative continue Unasyn and monitor clinically.  Sepsis pathophysiology has completely resolved.  AKI on CKD stage III: Initially thought to have hemodynamically mediated kidney injury with some contribution from obstructive uropathy.  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  Serum creatinine along with urine output remains stable, post void bladder scans are stable as well, case discussed with nephrologist Dr. Joelyn Oms bedside on 06/14/2018.  Monitor for bone marrow biopsy results, for now no indication for dialysis/plasmapheresis.  Remains on Decadron  which will be continued.  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  Oncology suspecting light chain myeloma-bone survey negative-underwent bone marrow biopsy on 10/11.  Discussed with Dr Pollyann Samples that sepsis is significantly better-we will start Decadron milligrams daily from 10/12 for 4 doses.  Once bone marrow biopsy is back-oncology will delineate further plans.  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  Required second unit of PRBC transfusion on 10/11-hemoglobin stable posttransfusion-follow CBC periodically.   Hypokalemia: Repleted  BPH with recent urinary retention/obstructive uropathy requiring Foley catheter placement: Foley catheter was inserted on 10/5 due to worsening renal function and concern for obstructive uropathy contributing to AKI-with improving renal function-and leaking Foley catheter-this catheter was discontinued on 10/9.  Continue to monitor closely-with frequent bladder scans.  Continue Flomax and Proscar.   Chronic diastolic heart failure: Remains euvolemic-follow volume status closely.  Hypertension: BP related likely due to Decadron, Norvasc dose increased added hydralazine p.o. and PRN IV.  Will monitor.  CAD: No anginal symptoms-follow.  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  Questionable renal cell carcinoma on CT scan.  Outpatient urology follow-up post discharge.    DVT Prophylaxis: Prophylactic Heparin   Code Status: Full code  Family Communication: None at bedside  Disposition Plan: Remain inpatient-probably require several more days of hospitalization.   Antimicrobial agents: Anti-infectives (From admission, onward)   Start     Dose/Rate Route Frequency  Ordered Stop   06/08/18 1200  Ampicillin-Sulbactam (UNASYN) 3 g in sodium chloride 0.9 % 100 mL IVPB  3 g 200 mL/hr over 30 Minutes Intravenous Every 12 hours 06/08/18 1051 06/13/18 2359   06/08/18 1100  Ampicillin-Sulbactam (UNASYN) 3 g in sodium chloride 0.9 % 100 mL IVPB  Status:  Discontinued     3 g 200 mL/hr over 30 Minutes Intravenous Every 24 hours 06/08/18 1049 06/08/18 1051   06/08/18 1000  meropenem (MERREM) 1 g in sodium chloride 0.9 % 100 mL IVPB  Status:  Discontinued     1 g 200 mL/hr over 30 Minutes Intravenous Every 24 hours 06/07/18 1303 06/08/18 1026   06/06/18 1345  meropenem (MERREM) 1 g in sodium chloride 0.9 % 100 mL IVPB  Status:  Discontinued     1 g 200 mL/hr over 30 Minutes Intravenous Every 12 hours 06/06/18 1333 06/07/18 1303   06/06/18 0800  ceFEPIme (MAXIPIME) 1 g in sodium chloride 0.9 % 100 mL IVPB  Status:  Discontinued     1 g 200 mL/hr over 30 Minutes Intravenous Every 24 hours 06/05/18 1056 06/06/18 1333   06/05/18 0900  vancomycin (VANCOCIN) 2,000 mg in sodium chloride 0.9 % 500 mL IVPB     2,000 mg 250 mL/hr over 120 Minutes Intravenous  Once 06/05/18 0816 06/05/18 1149   06/05/18 0730  ceFEPIme (MAXIPIME) 2 g in sodium chloride 0.9 % 100 mL IVPB     2 g 200 mL/hr over 30 Minutes Intravenous  Once 06/05/18 0720 06/05/18 0833   06/05/18 0730  metroNIDAZOLE (FLAGYL) IVPB 500 mg  Status:  Discontinued     500 mg 100 mL/hr over 60 Minutes Intravenous Every 8 hours 06/05/18 0720 06/05/18 1035   06/05/18 0730  vancomycin (VANCOCIN) IVPB 1000 mg/200 mL premix  Status:  Discontinued     1,000 mg 200 mL/hr over 60 Minutes Intravenous  Once 06/05/18 0720 06/05/18 0816      Procedures: None  CONSULTS:  None  Time spent: 25 minutes-Greater than 50% of this time was spent in counseling, explanation of diagnosis, planning of further management, and coordination of care.  MEDICATIONS: Scheduled Meds: . sodium chloride   Intravenous Once  .  amLODipine  10 mg Oral Daily  . dexamethasone  20 mg Oral Daily  . finasteride  5 mg Oral Daily  . heparin  5,000 Units Subcutaneous Q8H  . hydrALAZINE  50 mg Oral Q8H  . pantoprazole  40 mg Oral Q1200  . tamsulosin  0.4 mg Oral QHS   Continuous Infusions:  PRN Meds:.acetaminophen **OR** [DISCONTINUED] acetaminophen, albuterol, hydrALAZINE, HYDROcodone-acetaminophen, metoprolol tartrate, ondansetron **OR** ondansetron (ZOFRAN) IV, senna-docusate, sodium chloride   PHYSICAL EXAM: Vital signs: Vitals:   06/14/18 0853 06/14/18 1741 06/14/18 2132 06/15/18 0508  BP:  (!) 152/94 (!) 164/89 (!) 164/92  Pulse:  (!) 57 (!) 52 (!) 48  Resp:  _0 Temp:  97.7 F (36.5 C) 98.1 F (36.7 C) 98.8 F (37.1 C)  TempSrc:  Oral Oral Oral  SpO2:  100% 100% 100%  Weight: 99.9 kg   100.7 kg  Height:       Filed Weights   06/13/18 0432 06/14/18 0853 06/15/18 0508  Weight: 102.7 kg 99.9 kg 100.7 kg   Body mass index is 35.85 kg/m.   Exam  Awake Alert, Oriented X 3, No new F.N deficits, Normal affect [place].AT,PERRAL Supple Neck,No JVD, No cervical lymphadenopathy appriciated.  Symmetrical Chest wall movement, Good air movement bilaterally, CTAB RRR,No Gallops, Rubs or new Murmurs, No Parasternal Heave +ve B.Sounds, Abd Soft, No tenderness, No organomegaly appriciated,  No rebound - guarding or rigidity. No Cyanosis, Clubbing or edema, No new Rash or bruise   I have personally reviewed following labs and imaging studies  LABORATORY DATA: CBC: Recent Labs  Lab 06/09/18 1019 06/10/18 0337 06/10/18 1605 06/11/18 0324 06/12/18 0408 06/13/18 0705  WBC 6.5 7.5  --  7.4 6.4 6.1  NEUTROABS  --   --   --   --  4.0  --   HGB 7.2* 6.8* 8.1* 8.3* 7.4* 8.5*  HCT 23.0* 21.8* 26.0* 25.4* 23.7* 25.7*  MCV 100.4* 98.6  --  97.3 96.7 94.8  PLT 267 341  --  383 418* 323    Basic Metabolic Panel: Recent Labs  Lab 06/10/18 0337 06/11/18 0324 06/12/18 0408 06/13/18 0303 06/14/18 0505  06/15/18 0436  NA 138 138 138 139 137 137  K 3.6 3.6 3.4* 3.7 4.1 4.1  CL 105 103 101 103 101 99  CO2 _0 GLUCOSE 93 91 88 97 127* 133*  BUN 63* 60* 62* 59* 69* 83*  CREATININE 5.77* 5.50* 5.22* 5.22* 5.30* 5.33*  CALCIUM 8.2* 8.5* 8.5* 8.6* 9.1 9.2  PHOS 5.0* 5.0* 5.2* 5.0* 4.7*  --     GFR: Estimated Creatinine Clearance: 13.5 mL/min (A) (by C-G formula based on SCr of 5.33 mg/dL (H)).  Liver Function Tests: Recent Labs  Lab 06/10/18 0337 06/11/18 0324 06/12/18 0408 06/13/18 0303 06/14/18 0505  ALBUMIN 2.8* 3.0* 3.1* 2.9* 3.2*   No results for input(s): LIPASE, AMYLASE in the last 168 hours. No results for input(s): AMMONIA in the last 168 hours.  Coagulation Profile: Recent Labs  Lab 06/12/18 0408  INR 1.06    Cardiac Enzymes: No results for input(s): CKTOTAL, CKMB, CKMBINDEX, TROPONINI in the last 168 hours.  BNP (last 3 results) No results for input(s): PROBNP in the last 8760 hours.  HbA1C: No results for input(s): HGBA1C in the last 72 hours.  CBG: Recent Labs  Lab 06/09/18 1652 06/10/18 0852 06/12/18 0432 06/12/18 0819 06/12/18 2123  GLUCAP 124* 87 86 84 93    Lipid Profile: No results for input(s): CHOL, HDL, LDLCALC, TRIG, CHOLHDL, LDLDIRECT in the last 72 hours.  Thyroid Function Tests: No results for input(s): TSH, T4TOTAL, FREET4, T3FREE, THYROIDAB in the last 72 hours.  Anemia Panel: No results for input(s): VITAMINB12, FOLATE, FERRITIN, TIBC, IRON, RETICCTPCT in the last 72 hours.  Urine analysis:    Component Value Date/Time   COLORURINE STRAW (A) 06/07/2018 1643   APPEARANCEUR CLEAR 06/07/2018 1643   LABSPEC 1.008 06/07/2018 1643   PHURINE 6.0 06/07/2018 1643   GLUCOSEU NEGATIVE 06/07/2018 1643   HGBUR LARGE (A) 06/07/2018 1643   BILIRUBINUR NEGATIVE 06/07/2018 1643   KETONESUR NEGATIVE 06/07/2018 1643   PROTEINUR 30 (A) 06/07/2018 1643   NITRITE NEGATIVE 06/07/2018 1643   LEUKOCYTESUR MODERATE (A)  06/07/2018 1643    Sepsis Labs: Lactic Acid, Venous    Component Value Date/Time   LATICACIDVEN 1.63 06/05/2018 0310    MICROBIOLOGY: Recent Results (from the past 240 hour(s))  MRSA PCR Screening     Status: None   Collection Time: 06/05/18  1:04 PM  Result Value Ref Range Status   MRSA by PCR NEGATIVE NEGATIVE Final    Comment:        The GeneXpert MRSA Assay (FDA approved for NASAL specimens only), is one component of a comprehensive MRSA colonization surveillance program. It is not intended to diagnose MRSA infection nor to guide or monitor treatment  for MRSA infections. Performed at [facility], [place] [address], [place], [place] [address]   Culture, blood (routine x 2)     Status: None (Preliminary result)   Collection Time: 06/10/18  4:50 PM  Result Value Ref Range Status   Specimen Description BLOOD LEFT HAND  Final   Special Requests   Final    BOTTLES DRAWN AEROBIC ONLY Blood Culture adequate volume   Culture   Final    NO GROWTH 4 DAYS Performed at [facility], [place] [address], [place], [place] [address]    Report Status PENDING  Incomplete  Culture, blood (routine x 2)     Status: None (Preliminary result)   Collection Time: 06/10/18  5:00 PM  Result Value Ref Range Status   Specimen Description BLOOD RIGHT ANTECUBITAL  Final   Special Requests   Final    BOTTLES DRAWN AEROBIC ONLY Blood Culture adequate volume   Culture   Final    NO GROWTH 4 DAYS Performed at [facility], [place] [address], [place], [place] [address]    Report Status PENDING  Incomplete    RADIOLOGY STUDIES/RESULTS: Ct Abdomen Pelvis Wo Contrast  Result Date: 06/05/2018 CLINICAL DATA:  Abdominal pain, fever.  Recent partial colectomy. EXAM: CT ABDOMEN AND PELVIS WITHOUT CONTRAST TECHNIQUE: Multidetector CT imaging of the abdomen and pelvis was performed following the standard protocol without IV contrast. COMPARISON:  05/30/2018 FINDINGS: Lower chest: Cardiomegaly.   Bibasilar atelectasis.  No effusions. Hepatobiliary: Small scattered hepatic cysts, small scattered hepatic low-density lesions throughout the liver, likely cysts although these cannot be characterized fully without intravenous contrast. Gallbladder unremarkable. Pancreas: No focal abnormality or ductal dilatation. Spleen: No focal abnormality.  Normal size. Adrenals/Urinary Tract: Numerous low-density, mixed density and high-density masses throughout the kidney are stable since recent study and cannot be characterized fully without intravenous contrast. At least 2 of these masses were shown on prior contrast enhanced CT to be suspicious for renal cell carcinoma. No hydronephrosis. Adrenal glands unremarkable. Urinary bladder decompressed and mildly thick walled, likely related to prostate enlargement. Stomach/Bowel: Descending colonic and sigmoid diverticulosis. Postoperative changes in the right colon from right hemicolectomy, stable. Again seen is the ventral hernia containing a portion of the mid transverse colon which appears unremarkable. No evidence of obstruction or bowel wall thickening. Stomach and small bowel grossly unremarkable. No evidence of bowel obstruction. Vascular/Lymphatic: Aortic atherosclerosis. No enlarged abdominal or pelvic lymph nodes. Reproductive: Marked enlargement of the prostate. Other: No free fluid or free air. Continued wall thickening within the periphery of the midline supraumbilical ventral hernia which also contains the transverse colon. Again, no underlying bowel wall thickening. This has a stable appearance since prior study. Musculoskeletal: No acute bony abnormality. Diffuse degenerative changes in the lumbar spine. IMPRESSION: Stable appearance of prior right hemicolectomy. Stable supraumbilical ventral hernia containing a portion of the transverse colon. The wall of the ventral hernia again appears thickened and indistinct, but underlying bowel does not appear abnormal.  Scattered diverticulosis.  No active diverticulitis. Marked prostate enlargement with bladder wall thickening. Multiple renal lesions. At least 2 of which were shown on prior contrast-enhanced CT to be suspicious for renal cell carcinoma. No change. Scattered hepatic low-density lesions cannot be characterized without intravenous contrast, likely cysts. Cardiomegaly, bibasilar atelectasis. Aortic atherosclerosis. Electronically Signed   By: Rolm Baptise M.D.   On: 06/05/2018 08:22   Ct Abdomen Pelvis Wo Contrast  Result Date: 05/30/2018 CLINICAL DATA:  Partial right colectomy,  perform laparoscopically. EXAM: CT ABDOMEN AND PELVIS WITHOUT CONTRAST TECHNIQUE: Multidetector CT imaging of the abdomen and pelvis was performed following the standard protocol without IV contrast. COMPARISON:  March 24, 2018 FINDINGS: Lower chest: Cardiomegaly.  No other abnormalities. Hepatobiliary: Multiple low-attenuation lesions in the liver are most likely cysts, unchanged. The liver and gallbladder are otherwise unremarkable. Pancreas: Unremarkable. No pancreatic ductal dilatation or surrounding inflammatory changes. Spleen: Normal in size without focal abnormality. Adrenals/Urinary Tract: Adrenal glands are normal. A 4.1 cm apparently solid mass is seen in the upper pole of the left kidney measuring 3.9 cm previously. A 2.7 cm mass in the medial left kidney on series 2, image 28 is essentially unchanged and nonspecific. Several renal cysts are noted. There is a solid and cystic appearing mass in the lower pole of the right kidney difficult to compare to the previous study due to lack of contrast today. However, the maximum measurement is approximately 4.6 cm, unchanged. Several probable hyperdense cysts are noted. The bladder is decompressed with a Foley catheter. There is mild fat stranding adjacent to the bladder which is a new finding. No renal stones or hydronephrosis. No ureteral stones. Stomach/Bowel: The stomach and small  bowel are normal. The patient is status post partial right colectomy scattered colonic diverticuli are noted. There is a new ventral hernia, located superior to the previously identified umbilical hernia. This is best seen on series 2, image 35. There is fat stranding within the hernia which likely contains omentum. It also contains a portion of transverse colon which does not appear to be inflamed. The wall of the hernia is thickened and inflamed. Surgical changes are seen in the right lower abdomen at the site of right colectomy. There is some increased attenuation in the surrounding fat, likely postoperative. No collection to suggest abscess or leak. The appendix is been removed along with the right partial colectomy. Vascular/Lymphatic: Mild atherosclerotic changes in the aorta. No obvious adenopathy. Reproductive: Again noted is an enlarged prostate exerting mass effect on the posterior bladder. Other: No abdominal wall hernia or abnormality. No abdominopelvic ascites. Musculoskeletal: No acute or significant osseous findings. IMPRESSION: 1. At least 2 renal masses concerning for renal cell carcinoma. The masses were better assessed with a previous contrast-enhanced study. Recommend an MRI when the patient is able. 2. There is a new ventral hernia. The periphery of the hernia/hernia wall is thickened and inflamed. The hernia contains transverse colon which does not appear to be inflamed or obstructed. There is also likely some herniated omentum with associated fat stranding. This hernia is new compared to March 24, 2018. No associated bowel obstruction. 3. Inflammatory changes in the right lower quadrant, surrounding the site of right partial colectomy. These findings are likely postsurgical as surgery was only just over 1 week ago. No evidence of leak or abscess. 4. Atherosclerotic changes in the abdominal aorta. Electronically Signed   By: Dorise Bullion III M.D   On: 05/30/2018 12:24   Dg Chest 2  View  Result Date: 06/05/2018 CLINICAL DATA:  Shortness of breath. EXAM: CHEST - 2 VIEW COMPARISON:  Chest CT 05/19/2018 FINDINGS: Chronic cardiomegaly and aortic tortuosity. No pulmonary edema, pleural effusion, focal airspace disease or pneumothorax. No acute osseous abnormalities. IMPRESSION: Cardiomegaly without congestive failure or acute chest finding. Electronically Signed   By: Keith Rake M.D.   On: 06/05/2018 03:21   US Renal  Result Date: 06/06/2018 CLINICAL DATA:  Acute kidney injury, history coronary artery disease, chronic diastolic CHF, COPD, type  II diabetes mellitus, hypertension, hypertensive heart disease, prostate cancer, renal cell carcinoma EXAM: RENAL / URINARY TRACT ULTRASOUND COMPLETE COMPARISON:  CT abdomen and pelvis 06/05/2017 FINDINGS: Right Kidney: Length: 14.5 cm. Normal cortical thickness. Upper normal echogenicity. Cystic lesion at mid kidney, 2.7 x 2.2 x 3.0 cm, containing a few scattered low level internal echoes; this lesion is hyperdense on the prior CT exam. An additional nodule at the inferior pole measures 3.9 x 2.0 x 2.7 cm and demonstrates multiple septations and low-level scattered internal echogenicity. This lesion is mildly complicated and demonstrated low-attenuation and high attenuation components on the prior CT. No hydronephrosis or shadowing calcification. Left Kidney: Length: 16.7 cm. Normal cortical thickness. Minimally increased cortical echogenicity. Small simple cyst at inferior pole, 3.9 x 2.0 x 2.7 cm. Additional complicated cyst at upper pole 2.5 x 2.1 x 2.9 cm with a scattered internal echoes. Additional mass at upper pole appear solid, 4.9 x 4.5 x 4.5 cm, highly suspicious for a renal neoplasm. No hydronephrosis or shadowing calcification. Bladder: Normal appearing bladder. Marked prostatic enlargement, 9.7 x 4.5 x 6.8 cm IMPRESSION: Probable medical renal disease changes of the kidneys. BILATERAL simple renal cysts. Additional solid mass at  upper pole LEFT kidney 4.9 cm greatest size most likely representing a solid renal neoplasm, demonstrated enhancement on a prior CT exam of 03/24/2018 and now demonstrates an interval increase in size. Additional complicated cystic lesions at the inferior pole of RIGHT kidney and upper pole LEFT kidney, indeterminate, cannot exclude renal neoplasms; consider MR characterization. Marked prostatic enlargement. Electronically Signed   By: Lavonia Dana M.D.   On: 06/06/2018 17:55   Dg Bone Survey Met  Result Date: 06/10/2018 CLINICAL DATA:  Multiple myeloma EXAM: METASTATIC BONE SURVEY COMPARISON:  None. FINDINGS: Metastatic bone survey was performed. 1. Cervical Spine: Degenerative disc disease C3 through C7. No lytic abnormality. 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 2. Both shoulders: Mild osteoarthritis of the acromioclavicular and glenohumeral joints bilaterally. No lytic abnormality or fracture. No joint dislocation. 3. Humeri: No lytic abnormality. Mild degenerative subcortical cystic change of the right humeral head adjacent to the greater tuberosity. 4. Bilateral forearms: No lytic abnormality of the radius or ulna on either side. Mild degenerative cystic change of the left scaphoid. 5. Lateral skull: Negative aggressive osteolytic lesions. 6. Pain and lateral thoracic spine: Multilevel degenerative disc disease and endplate spurring. Patchy ill-defined appearance of the mid to lower thoracic vertebral bodies. Cannot exclude small lytic foci. Dedicated CT of the thoracic spine may help for better assessment. 7. Lumbar spine: Mild degenerative change with disc flattening and endplate spurring. No aggressive lytic abnormalities. 8. Chest: Cardiomegaly. Aortic atherosclerosis. Clear lungs. No definite lytic abnormality of the visualized ribs. 9. AP pelvis: No fracture nor aggressive lytic abnormality noted. 10. Femora: Negative for lytic abnormality. 11. Tibia and fibula: Negative  bilaterally lytic abnormality. IMPRESSION: Slightly patchy ill-defined appearance of the thoracic spine some which is due to overlap of adjacent ribs and lung limiting assessment. The possibility of subtle lytic abnormalities is not excluded as a result. CT may help for better assessment. Otherwise, no aggressive nor suspicious lytic abnormalities are identified. Electronically Signed   By: Ashley Royalty M.D.   On: 06/10/2018 23:57   Ct Bone Marrow Biopsy & Aspiration  Result Date: 06/12/2018 INDICATION: 72 year old with anemia and evaluate for light chain myeloma. EXAM: CT GUIDED BONE MARROW ASPIRATES AND BIOPSY Physician: Stephan Minister. Henn, MD MEDICATIONS: No antibiotics ANESTHESIA/SEDATION: Fentanyl 25 mcg IV; Versed 0.5 mg  IV Moderate Sedation Time: The patient was continuously monitored during the procedure by the interventional radiology nurse under my direct supervision. COMPLICATIONS: None immediate. PROCEDURE: The procedure was explained to the patient. The risks and benefits of the procedure were discussed and the patient's questions were addressed. Informed consent was obtained from the patient. The patient was placed prone on CT table. Images of the pelvis were obtained. The back was prepped and draped in sterile fashion. Maximal barrier sterile technique was utilized including caps, mask, sterile gowns, sterile gloves, sterile drape, hand hygiene and skin antiseptic. The skin and right posterior ilium were anesthetized with 1% lidocaine. 11 gauge bone needle was directed into the right ilium with CT guidance. Two aspirates and one core biopsy were obtained. Bandage placed over the puncture site. FINDINGS: Bone needle was directed into the posterior right ilium. Enlarged prostate. IMPRESSION: CT guided bone marrow aspiration and core biopsy. Electronically Signed   By: Markus Daft M.D.   On: 06/12/2018 19:56   Ct Angio Chest Aorta W &/or Wo Contrast  Result Date: 05/19/2018 CLINICAL DATA:  Yearly follow  up thoracic aortic aneurysm. Hx of recent syncopal episodes in [place] and post prandial. EXAM: CT ANGIOGRAPHY CHEST WITH CONTRAST TECHNIQUE: Multidetector CT imaging of the chest was performed using the standard protocol during bolus administration of intravenous contrast. Multiplanar CT image reconstructions and MIPs were obtained to evaluate the vascular anatomy. CONTRAST:  142m ISOVUE-370 IOPAMIDOL (ISOVUE-370) INJECTION 76% COMPARISON:  05/30/2017 and previous FINDINGS: Cardiovascular: Cardiomegaly. No pericardial effusion. Dilated central pulmonary arteries. Fair contrast opacification of pulmonary artery branches; the exam was not optimized for detection of pulmonary emboli. Mild scattered coronary calcifications. Good contrast opacification of the thoracic aorta without dissection or stenosis. Transverse diameters as follows: 5.2 cm sinuses of Valsalva 4.2 cm  sino-tubular junction 4.6 cm mid ascending (previously 4.3 cm) 4.3 cm distal ascending/proximal arch 3.4 cm distal arch 3.2 cm proximal descending 2.8 cm distal descending Bovine variant brachiocephalic arterial origin anatomy without proximal stenosis. Visualized proximal abdominal aorta unremarkable. Ectatic distal celiac axis up to 1.5 cm diameter. Mediastinum/Nodes: No hilar or mediastinal adenopathy. Lungs/Pleura: No pleural effusion.  No pneumothorax.  Lungs clear. Upper Abdomen: Nodular liver contour. Stable hepatic cysts. Exophytic 5.8 cm upper pole left renal lesion measuring above fluid attenuation. No acute findings. Musculoskeletal: 5 mm subcutaneous metallic density in the left anterior chest wall subcutaneous tissues. Mid and lower thoracic spondylitic change. No fracture or worrisome bone lesion. Review of the MIP images confirms the above findings. IMPRESSION: 1. 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 Recommend semi-annual imaging followup by CTA or MRA  and referral to cardiothoracic surgery if not already obtained. This recommendation follows 2010 ACCF/AHA/AATS/ACR/ASA/SCA/SCAI/SIR/STS/SVM Guidelines for the Diagnosis and Management of Patients With Thoracic Aortic Disease. Circulation. 2010; 121: eY185-U3142. 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 Recommend dedicated renal protocol CT or MR with contrast. 3. Coronary calcifications. The severity of coronary artery disease and any potential stenosis cannot be assessed on this non-gated CT examination. Assessment for potential risk factor modification, dietary therapy or pharmacologic therapy may be warranted, if clinically indicated. 4. Nodular liver contour suggesting cirrhosis. Electronically Signed   By: DLucrezia EuropeM.D.   On: 05/19/2018 13:45     LOS: 10 days   Signature  PLala LundM.D on 06/15/2018 at 8:47 AM  To page go  to [email or web address] - password [email or web address]

## 2018-06-15 NOTE — Progress Notes (Signed)
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 Creat stable in low 5's  No intake/output data recorded.  Filed Weights   06/13/18 0432 06/14/18 0853 06/15/18 0508  Weight: 102.7 kg 99.9 kg 100.7 kg    Scheduled Meds: . sodium chloride   Intravenous Once  . amLODipine  10 mg Oral Daily  . dexamethasone  20 mg Oral Daily  . finasteride  5 mg Oral Daily  . heparin  5,000 Units Subcutaneous Q8H  . hydrALAZINE  50 mg Oral Q8H  . pantoprazole  40 mg Oral Q1200  . tamsulosin  0.4 mg Oral QHS   Continuous Infusions:  PRN Meds:.acetaminophen **OR** [DISCONTINUED] acetaminophen, albuterol, hydrALAZINE, HYDROcodone-acetaminophen, metoprolol tartrate, ondansetron **OR** ondansetron (ZOFRAN) IV, senna-docusate, sodium chloride  Current Labs: reviewed   Physical Exam:  Blood pressure 130/70, pulse (!) 48, temperature 98.8 F (37.1 C), temperature source Oral, resp. rate 18, height 5' 6"  (1.676 m), weight 100.7 kg, SpO2 100 %. NAD, lying in bed  RRR nl s1s2 CtAB Trace Eemea No rashes/ Nonfocal, CN2-12 intace EOMI NCAT  A 1. AoCKD3 likely from a) uti+ sepsis b) obstruction req foley c) monoclonal process (also, serologies including slightly ^'d PR3 on unclear significance).  Now UOP excellent and SCr continues to slowly improve or may be leveling off in the low 5's.   2. Abnormal SFLC, history of MGUS; elevated Kappa level -- heme following, has outpatient oncologist, sp BM Bx 10/11, start dexamethasone 10/12; pending results of bone marrow biopsy  3. BPH with retention, on flomax/proscar, Foley removed no evidence of recurrent obstruction 4. Acinetobacter bacteremia and UTI: on unasyn, ID following 5. Anemia, as required transfusion 6. CAD  7. 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 With advanced CKD and renal masses is poor candidate for renal biopsy or pheresis.  . Awaiting results of BM Bx . Will follow  Kelly Splinter MD [facility] pgr [phone]   06/15/2018, 12:49 PM    Recent Labs  Lab 06/12/18 0408 06/13/18 0303 06/14/18 0505 06/15/18 0436  NA 138 139 137 137  K 3.4* 3.7 4.1 4.1  CL 101 103 101 99  CO2 23 25 23 24   GLUCOSE 88 97 127* 133*  BUN 62* 59* 69* 83*  CREATININE 5.22* 5.22* 5.30* 5.33*  CALCIUM 8.5* 8.6* 9.1 9.2  PHOS 5.2* 5.0* 4.7*  --    Recent Labs  Lab 06/11/18 0324 06/12/18 0408 06/13/18 0705  WBC 7.4 6.4 6.1  NEUTROABS  --  4.0  --   HGB 8.3* 7.4* 8.5*  HCT 25.4* 23.7* 25.7*  MCV 97.3 96.7 94.8  PLT 383 418* 392

## 2018-06-16 LAB — CULTURE, BLOOD (ROUTINE X 2)
CULTURE: NO GROWTH
Culture: NO GROWTH
SPECIAL REQUESTS: ADEQUATE
Special Requests: ADEQUATE

## 2018-06-16 LAB — IRON AND TIBC
Iron: 104 ug/dL (ref 45–182)
SATURATION RATIOS: 31 % (ref 17.9–39.5)
TIBC: 332 ug/dL (ref 250–450)
UIBC: 228 ug/dL

## 2018-06-16 NOTE — Progress Notes (Signed)
Physical Therapy Treatment Patient Details Name: [name] [name] MRN: [id number] DOB: 27-Dec-1945 Today's Date: 06/16/2018    History of Present Illness  [name] [name]  is a 72 y.o. male, past medical history of CAD, hypertension, hyperlipidemia, A. 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 Pt found to have soft BP, tachycardia, UTI, and worsening renal function in ED.     PT Comments    Pt performed gait training with independence and performed all other functional mobility.  No signs of balance deficits during session this pm.  Pt no longer requires follow up with OP PT, patient issued HEP and educated on continued walking program and frequency of exercises.  Pt has been ambulating halls on unit independently.  Will inform supervising PT of updated recommendations at the time.     Follow Up Recommendations  No PT follow up     Equipment Recommendations  None recommended by PT    Recommendations for Other Services       Precautions / Restrictions Precautions Precautions: Fall Restrictions Weight Bearing Restrictions: No    Mobility  Bed Mobility               General bed mobility comments: Pt in chair on arrival.    Transfers Overall transfer level: Modified independent Equipment used: None Transfers: Sit to/from Stand Sit to Stand: Modified independent (Device/Increase time)            Ambulation/Gait Ambulation/Gait assistance: Independent Gait Distance (Feet): 400 Feet Assistive device: None Gait Pattern/deviations: WFL(Within Functional Limits) Gait velocity: decreased   General  Gait Details: Tolerance remains to improve, no LOB noted.  Pt able to have conversation in halls and even attempted running in place in halls to show he is moving well.     Stairs             Wheelchair Mobility    Modified Rankin (Stroke Patients Only)       Balance Overall balance assessment: Needs assistance Sitting-balance support: No upper extremity supported;Feet supported Sitting balance-Leahy Scale: Good       Standing balance-Leahy Scale: Fair                              Cognition Arousal/Alertness: Awake/alert Behavior During Therapy: WFL for tasks assessed/performed Overall Cognitive Status: Within Functional Limits for tasks assessed                                        Exercises General Exercises - Lower Extremity Hip ABduction/ADduction: AROM;Both;10 reps;Standing(with counter support.  ) Hip Flexion/Marching: AROM;Both;Standing Heel Raises: AROM;Both;10 reps;Standing Mini-Sqauts: AROM;Both;10 reps;Standing    General Comments        Pertinent Vitals/Pain Pain Assessment: No/denies pain    Home Living                      Prior Function            PT Goals (current goals can now be found in the care plan section) Acute Rehab PT Goals Patient  Stated Goal: feel better Potential to Achieve Goals: Good Progress towards PT goals: Goals met/education completed, patient discharged from PT    Frequency    Min 3X/week      PT Plan Discharge plan needs to be updated    Co-evaluation              AM-PAC PT "6 Clicks" Daily Activity  Outcome Measure  Difficulty turning over in bed (including adjusting bedclothes, sheets and blankets)?: None Difficulty moving from lying on back to sitting on the side of the bed? : None Difficulty sitting down on and standing up from a chair with arms (e.g., wheelchair, bedside commode, etc,.)?: None Help needed moving to and from a bed to chair (including a  wheelchair)?: None Help needed walking in hospital room?: None Help needed climbing 3-5 steps with a railing? : None 6 Click Score: 24    End of Session Equipment Utilized During Treatment: Gait belt Activity Tolerance: Patient tolerated treatment well Patient left: in bed;with call bell/phone within reach;with bed alarm set Nurse Communication: Mobility status PT Visit Diagnosis: Dizziness and giddiness (R42);Other abnormalities of gait and mobility (R26.89)     Time: 5051-8335 PT Time Calculation (min) (ACUTE ONLY): 26 min  Charges:  $Gait Training: 8-22 mins $Therapeutic Exercise: 8-22 mins                     Governor Rooks, PTA Acute Rehabilitation Services Pager [phone] Office [phone]     [name] [name] 06/16/2018, 2:19 PM

## 2018-06-16 NOTE — Progress Notes (Signed)
PROGRESS NOTE        PATIENT DETAILS Name: [name] [name] Age: 72 y.o. Sex: male Date of Birth: [birth year] Admit Date: 06/05/2018 Admitting Physician Albertine Patricia, MD BDZ:[id number], Christean Grief, MD  Brief Narrative: Patient is a 72 y.o. male with history of CAD, hypertension, PAF not on anticoagulation-recent right hemicolectomy for a cecal mass-just discharged by the general surgical service on 6/83-[id number] course was complicated by development of obstructive uropathy and AKI ( discharged with a Foley catheter, successful outpatient voiding trial on 10/1)-presented to the hospital with sepsis secondary to Acinetobacter bacteremia, acute kidney injury.  Further work-up demonstrates significantly elevated serum kappa light chains, mildly positive ANCA serology.  See below for further details  Subjective:  Patient in bed, appears comfortable, denies any headache, no fever, no chest pain or pressure, no shortness of breath , no abdominal pain. No focal weakness.   Assessment/Plan:  Sepsis secondary to Acinetobacter bacteremia along with Acinetobacter and enterococcal UTI: Secondary to recent hospitalization for laparotomy/recent Foley catheter placement-sepsis pathophysiology has resolved-remains on IV Unasyn-stop date of 10/12.  Repeat cultures remain negative continue Unasyn and monitor clinically.  Sepsis pathophysiology has completely resolved.  AKI on CKD stage III: Initially thought to have hemodynamically mediated kidney injury with some contribution from obstructive uropathy.  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  Serum creatinine along with urine output remains stable, post void bladder scans are stable as well, case discussed with nephrologist Dr. Joelyn Oms bedside on 06/14/2018.  Monitor for bone marrow biopsy results, for now no indication for dialysis/plasmapheresis.  Remains on Decadron which  will be continued.  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  Oncology suspecting light chain myeloma-bone survey negative-underwent bone marrow biopsy on 10/11.  Discussed with Dr Pollyann Samples that sepsis is significantly better-we will start Decadron milligrams daily from 10/12 for 4 doses.  Once bone marrow biopsy is back-oncology will delineate further plans.  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  Required second unit of PRBC transfusion on 10/11-hemoglobin stable posttransfusion-follow CBC periodically.   Hypokalemia: Repleted  BPH with recent urinary retention/obstructive uropathy requiring Foley catheter placement: Foley catheter was inserted on 10/5 due to worsening renal function and concern for obstructive uropathy contributing to AKI-with improving renal function-and leaking Foley catheter-this catheter was discontinued on 10/9.  Continue to monitor closely-with frequent bladder scans.  Continue Flomax and Proscar.   Chronic diastolic heart failure: Remains euvolemic-follow volume status closely.  Hypertension: BP related likely due to Decadron, Norvasc dose increased added hydralazine p.o. and PRN IV.  Will monitor.  CAD: No anginal symptoms-follow.  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  Questionable renal cell carcinoma on CT scan.  Outpatient urology follow-up post discharge.    DVT Prophylaxis: Prophylactic Heparin   Code Status: Full code  Family Communication: None at bedside  Disposition Plan: Remain inpatient-probably require several more days of hospitalization.   Antimicrobial agents: Anti-infectives (From admission, onward)   Start     Dose/Rate Route Frequency Ordered Stop    06/08/18 1200  Ampicillin-Sulbactam (UNASYN) 3 g in sodium chloride 0.9 % 100 mL IVPB  3 g 200 mL/hr over 30 Minutes Intravenous Every 12 hours 06/08/18 1051 06/13/18 2359   06/08/18 1100  Ampicillin-Sulbactam (UNASYN) 3 g in sodium chloride 0.9 % 100 mL IVPB  Status:  Discontinued     3 g 200 mL/hr over 30 Minutes Intravenous Every 24 hours 06/08/18 1049 06/08/18 1051   06/08/18 1000  meropenem (MERREM) 1 g in sodium chloride 0.9 % 100 mL IVPB  Status:  Discontinued     1 g 200 mL/hr over 30 Minutes Intravenous Every 24 hours 06/07/18 1303 06/08/18 1026   06/06/18 1345  meropenem (MERREM) 1 g in sodium chloride 0.9 % 100 mL IVPB  Status:  Discontinued     1 g 200 mL/hr over 30 Minutes Intravenous Every 12 hours 06/06/18 1333 06/07/18 1303   06/06/18 0800  ceFEPIme (MAXIPIME) 1 g in sodium chloride 0.9 % 100 mL IVPB  Status:  Discontinued     1 g 200 mL/hr over 30 Minutes Intravenous Every 24 hours 06/05/18 1056 06/06/18 1333   06/05/18 0900  vancomycin (VANCOCIN) 2,000 mg in sodium chloride 0.9 % 500 mL IVPB     2,000 mg 250 mL/hr over 120 Minutes Intravenous  Once 06/05/18 0816 06/05/18 1149   06/05/18 0730  ceFEPIme (MAXIPIME) 2 g in sodium chloride 0.9 % 100 mL IVPB     2 g 200 mL/hr over 30 Minutes Intravenous  Once 06/05/18 0720 06/05/18 0833   06/05/18 0730  metroNIDAZOLE (FLAGYL) IVPB 500 mg  Status:  Discontinued     500 mg 100 mL/hr over 60 Minutes Intravenous Every 8 hours 06/05/18 0720 06/05/18 1035   06/05/18 0730  vancomycin (VANCOCIN) IVPB 1000 mg/200 mL premix  Status:  Discontinued     1,000 mg 200 mL/hr over 60 Minutes Intravenous  Once 06/05/18 0720 06/05/18 0816      Procedures: None  CONSULTS:  None  Time spent: 25 minutes-Greater than 50% of this time was spent in counseling, explanation of diagnosis, planning of further management, and coordination of care.  MEDICATIONS: Scheduled Meds: . sodium chloride   Intravenous Once  . amLODipine  10 mg  Oral Daily  . dexamethasone  20 mg Oral Daily  . finasteride  5 mg Oral Daily  . heparin  5,000 Units Subcutaneous Q8H  . hydrALAZINE  50 mg Oral Q8H  . pantoprazole  40 mg Oral Q1200  . tamsulosin  0.4 mg Oral QHS   Continuous Infusions:  PRN Meds:.acetaminophen **OR** [DISCONTINUED] acetaminophen, albuterol, hydrALAZINE, HYDROcodone-acetaminophen, metoprolol tartrate, ondansetron **OR** ondansetron (ZOFRAN) IV, senna-docusate, sodium chloride   PHYSICAL EXAM: Vital signs: Vitals:   06/15/18 1325 06/15/18 2048 06/15/18 2200 06/16/18 0607  BP: 139/85 (!) 141/79 (!) 157/95 (!) 156/79  Pulse: (!) 55 (!) 53  (!) 47  Resp: 16 17    Temp: 97.8 F (36.6 C) 98 F (36.7 C)  97.6 F (36.4 C)  TempSrc: Oral Oral  Oral  SpO2: 98% 99%  100%  Weight:      Height:       Filed Weights   06/13/18 0432 06/14/18 0853 06/15/18 0508  Weight: 102.7 kg 99.9 kg 100.7 kg   Body mass index is 35.85 kg/m.   Exam  Awake Alert, Oriented X 3, No new F.N deficits, Normal affect [place].AT,PERRAL Supple Neck,No JVD, No cervical lymphadenopathy appriciated.  Symmetrical Chest wall movement, Good air movement bilaterally, CTAB RRR,No Gallops, Rubs or new Murmurs, No Parasternal Heave +ve B.Sounds, Abd Soft, No tenderness, No organomegaly appriciated, No rebound -  guarding or rigidity. No Cyanosis, Clubbing or edema, No new Rash or bruise  I have personally reviewed following labs and imaging studies  LABORATORY DATA: CBC: Recent Labs  Lab 06/10/18 0337 06/10/18 1605 06/11/18 0324 06/12/18 0408 06/13/18 0705  WBC 7.5  --  7.4 6.4 6.1  NEUTROABS  --   --   --  4.0  --   HGB 6.8* 8.1* 8.3* 7.4* 8.5*  HCT 21.8* 26.0* 25.4* 23.7* 25.7*  MCV 98.6  --  97.3 96.7 94.8  PLT 341  --  383 418* 846    Basic Metabolic Panel: Recent Labs  Lab 06/10/18 0337 06/11/18 0324 06/12/18 0408 06/13/18 0303 06/14/18 0505 06/15/18 0436  NA 138 138 138 139 137 137  K 3.6 3.6 3.4* 3.7 4.1 4.1  CL 105 103  101 103 101 99  CO2 22 22 23 25 23 24   GLUCOSE 93 91 88 97 127* 133*  BUN 63* 60* 62* 59* 69* 83*  CREATININE 5.77* 5.50* 5.22* 5.22* 5.30* 5.33*  CALCIUM 8.2* 8.5* 8.5* 8.6* 9.1 9.2  PHOS 5.0* 5.0* 5.2* 5.0* 4.7*  --     GFR: Estimated Creatinine Clearance: 13.5 mL/min (A) (by C-G formula based on SCr of 5.33 mg/dL (H)).  Liver Function Tests: Recent Labs  Lab 06/10/18 0337 06/11/18 0324 06/12/18 0408 06/13/18 0303 06/14/18 0505  ALBUMIN 2.8* 3.0* 3.1* 2.9* 3.2*   No results for input(s): LIPASE, AMYLASE in the last 168 hours. No results for input(s): AMMONIA in the last 168 hours.  Coagulation Profile: Recent Labs  Lab 06/12/18 0408  INR 1.06    Cardiac Enzymes: No results for input(s): CKTOTAL, CKMB, CKMBINDEX, TROPONINI in the last 168 hours.  BNP (last 3 results) No results for input(s): PROBNP in the last 8760 hours.  HbA1C: No results for input(s): HGBA1C in the last 72 hours.  CBG: Recent Labs  Lab 06/12/18 0432 06/12/18 0819 06/12/18 2123 06/15/18 0741 06/15/18 2048  GLUCAP 86 84 93 100* 163*    Lipid Profile: No results for input(s): CHOL, HDL, LDLCALC, TRIG, CHOLHDL, LDLDIRECT in the last 72 hours.  Thyroid Function Tests: No results for input(s): TSH, T4TOTAL, FREET4, T3FREE, THYROIDAB in the last 72 hours.  Anemia Panel: No results for input(s): VITAMINB12, FOLATE, FERRITIN, TIBC, IRON, RETICCTPCT in the last 72 hours.  Urine analysis:    Component Value Date/Time   COLORURINE STRAW (A) 06/07/2018 1643   APPEARANCEUR CLEAR 06/07/2018 1643   LABSPEC 1.008 06/07/2018 1643   PHURINE 6.0 06/07/2018 1643   GLUCOSEU NEGATIVE 06/07/2018 1643   HGBUR LARGE (A) 06/07/2018 1643   BILIRUBINUR NEGATIVE 06/07/2018 1643   KETONESUR NEGATIVE 06/07/2018 1643   PROTEINUR 30 (A) 06/07/2018 1643   NITRITE NEGATIVE 06/07/2018 1643   LEUKOCYTESUR MODERATE (A) 06/07/2018 1643    Sepsis Labs: Lactic Acid, Venous    Component Value Date/Time    LATICACIDVEN 1.63 06/05/2018 0310    MICROBIOLOGY: Recent Results (from the past 240 hour(s))  Culture, blood (routine x 2)     Status: None (Preliminary result)   Collection Time: 06/10/18  4:50 PM  Result Value Ref Range Status   Specimen Description BLOOD LEFT HAND  Final   Special Requests   Final    BOTTLES DRAWN AEROBIC ONLY Blood Culture adequate volume   Culture   Final    NO GROWTH 4 DAYS Performed at [facility], [place] [address], [place], [place] [address]    Report Status PENDING  Incomplete  Culture,  blood (routine x 2)     Status: None (Preliminary result)   Collection Time: 06/10/18  5:00 PM  Result Value Ref Range Status   Specimen Description BLOOD RIGHT ANTECUBITAL  Final   Special Requests   Final    BOTTLES DRAWN AEROBIC ONLY Blood Culture adequate volume   Culture   Final    NO GROWTH 4 DAYS Performed at [facility], [place] [address] [place], [place] [address]    Report Status PENDING  Incomplete    RADIOLOGY STUDIES/RESULTS: Ct Abdomen Pelvis Wo Contrast  Result Date: 06/05/2018 CLINICAL DATA:  Abdominal pain, fever.  Recent partial colectomy. EXAM: CT ABDOMEN AND PELVIS WITHOUT CONTRAST TECHNIQUE: Multidetector CT imaging of the abdomen and pelvis was performed following the standard protocol without IV contrast. COMPARISON:  05/30/2018 FINDINGS: Lower chest: Cardiomegaly.  Bibasilar atelectasis.  No effusions. Hepatobiliary: Small scattered hepatic cysts, small scattered hepatic low-density lesions throughout the liver, likely cysts although these cannot be characterized fully without intravenous contrast. Gallbladder unremarkable. Pancreas: No focal abnormality or ductal dilatation. Spleen: No focal abnormality.  Normal size. Adrenals/Urinary Tract: Numerous low-density, mixed density and high-density masses throughout the kidney are stable since recent study and cannot be characterized fully without intravenous contrast. At least 2 of these masses  were shown on prior contrast enhanced CT to be suspicious for renal cell carcinoma. No hydronephrosis. Adrenal glands unremarkable. Urinary bladder decompressed and mildly thick walled, likely related to prostate enlargement. Stomach/Bowel: Descending colonic and sigmoid diverticulosis. Postoperative changes in the right colon from right hemicolectomy, stable. Again seen is the ventral hernia containing a portion of the mid transverse colon which appears unremarkable. No evidence of obstruction or bowel wall thickening. Stomach and small bowel grossly unremarkable. No evidence of bowel obstruction. Vascular/Lymphatic: Aortic atherosclerosis. No enlarged abdominal or pelvic lymph nodes. Reproductive: Marked enlargement of the prostate. Other: No free fluid or free air. Continued wall thickening within the periphery of the midline supraumbilical ventral hernia which also contains the transverse colon. Again, no underlying bowel wall thickening. This has a stable appearance since prior study. Musculoskeletal: No acute bony abnormality. Diffuse degenerative changes in the lumbar spine. IMPRESSION: Stable appearance of prior right hemicolectomy. Stable supraumbilical ventral hernia containing a portion of the transverse colon. The wall of the ventral hernia again appears thickened and indistinct, but underlying bowel does not appear abnormal. Scattered diverticulosis.  No active diverticulitis. Marked prostate enlargement with bladder wall thickening. Multiple renal lesions. At least 2 of which were shown on prior contrast-enhanced CT to be suspicious for renal cell carcinoma. No change. Scattered hepatic low-density lesions cannot be characterized without intravenous contrast, likely cysts. Cardiomegaly, bibasilar atelectasis. Aortic atherosclerosis. Electronically Signed   By: Rolm Baptise M.D.   On: 06/05/2018 08:22   Ct Abdomen Pelvis Wo Contrast  Result Date: 05/30/2018 CLINICAL DATA:  Partial right colectomy,  perform laparoscopically. EXAM: CT ABDOMEN AND PELVIS WITHOUT CONTRAST TECHNIQUE: Multidetector CT imaging of the abdomen and pelvis was performed following the standard protocol without IV contrast. COMPARISON:  March 24, 2018 FINDINGS: Lower chest: Cardiomegaly.  No other abnormalities. Hepatobiliary: Multiple low-attenuation lesions in the liver are most likely cysts, unchanged. The liver and gallbladder are otherwise unremarkable. Pancreas: Unremarkable. No pancreatic ductal dilatation or surrounding inflammatory changes. Spleen: Normal in size without focal abnormality. Adrenals/Urinary Tract: Adrenal glands are normal. A 4.1 cm apparently solid mass is seen in the upper pole of the left kidney measuring 3.9 cm previously. A 2.7 cm mass in the  medial left kidney on series 2, image 28 is essentially unchanged and nonspecific. Several renal cysts are noted. There is a solid and cystic appearing mass in the lower pole of the right kidney difficult to compare to the previous study due to lack of contrast today. However, the maximum measurement is approximately 4.6 cm, unchanged. Several probable hyperdense cysts are noted. The bladder is decompressed with a Foley catheter. There is mild fat stranding adjacent to the bladder which is a new finding. No renal stones or hydronephrosis. No ureteral stones. Stomach/Bowel: The stomach and small bowel are normal. The patient is status post partial right colectomy scattered colonic diverticuli are noted. There is a new ventral hernia, located superior to the previously identified umbilical hernia. This is best seen on series 2, image 35. There is fat stranding within the hernia which likely contains omentum. It also contains a portion of transverse colon which does not appear to be inflamed. The wall of the hernia is thickened and inflamed. Surgical changes are seen in the right lower abdomen at the site of right colectomy. There is some increased attenuation in the  surrounding fat, likely postoperative. No collection to suggest abscess or leak. The appendix is been removed along with the right partial colectomy. Vascular/Lymphatic: Mild atherosclerotic changes in the aorta. No obvious adenopathy. Reproductive: Again noted is an enlarged prostate exerting mass effect on the posterior bladder. Other: No abdominal wall hernia or abnormality. No abdominopelvic ascites. Musculoskeletal: No acute or significant osseous findings. IMPRESSION: 1. At least 2 renal masses concerning for renal cell carcinoma. The masses were better assessed with a previous contrast-enhanced study. Recommend an MRI when the patient is able. 2. There is a new ventral hernia. The periphery of the hernia/hernia wall is thickened and inflamed. The hernia contains transverse colon which does not appear to be inflamed or obstructed. There is also likely some herniated omentum with associated fat stranding. This hernia is new compared to March 24, 2018. No associated bowel obstruction. 3. Inflammatory changes in the right lower quadrant, surrounding the site of right partial colectomy. These findings are likely postsurgical as surgery was only just over 1 week ago. No evidence of leak or abscess. 4. Atherosclerotic changes in the abdominal aorta. Electronically Signed   By: Dorise Bullion III M.D   On: 05/30/2018 12:24   Dg Chest 2 View  Result Date: 06/05/2018 CLINICAL DATA:  Shortness of breath. EXAM: CHEST - 2 VIEW COMPARISON:  Chest CT 05/19/2018 FINDINGS: Chronic cardiomegaly and aortic tortuosity. No pulmonary edema, pleural effusion, focal airspace disease or pneumothorax. No acute osseous abnormalities. IMPRESSION: Cardiomegaly without congestive failure or acute chest finding. Electronically Signed   By: Keith Rake M.D.   On: 06/05/2018 03:21   US Renal  Result Date: 06/06/2018 CLINICAL DATA:  Acute kidney injury, history coronary artery disease, chronic diastolic CHF, COPD, type II  diabetes mellitus, hypertension, hypertensive heart disease, prostate cancer, renal cell carcinoma EXAM: RENAL / URINARY TRACT ULTRASOUND COMPLETE COMPARISON:  CT abdomen and pelvis 06/05/2017 FINDINGS: Right Kidney: Length: 14.5 cm. Normal cortical thickness. Upper normal echogenicity. Cystic lesion at mid kidney, 2.7 x 2.2 x 3.0 cm, containing a few scattered low level internal echoes; this lesion is hyperdense on the prior CT exam. An additional nodule at the inferior pole measures 3.9 x 2.0 x 2.7 cm and demonstrates multiple septations and low-level scattered internal echogenicity. This lesion is mildly complicated and demonstrated low-attenuation and high attenuation components on the prior CT. No hydronephrosis or  shadowing calcification. Left Kidney: Length: 16.7 cm. Normal cortical thickness. Minimally increased cortical echogenicity. Small simple cyst at inferior pole, 3.9 x 2.0 x 2.7 cm. Additional complicated cyst at upper pole 2.5 x 2.1 x 2.9 cm with a scattered internal echoes. Additional mass at upper pole appear solid, 4.9 x 4.5 x 4.5 cm, highly suspicious for a renal neoplasm. No hydronephrosis or shadowing calcification. Bladder: Normal appearing bladder. Marked prostatic enlargement, 9.7 x 4.5 x 6.8 cm IMPRESSION: Probable medical renal disease changes of the kidneys. BILATERAL simple renal cysts. Additional solid mass at upper pole LEFT kidney 4.9 cm greatest size most likely representing a solid renal neoplasm, demonstrated enhancement on a prior CT exam of 03/24/2018 and now demonstrates an interval increase in size. Additional complicated cystic lesions at the inferior pole of RIGHT kidney and upper pole LEFT kidney, indeterminate, cannot exclude renal neoplasms; consider MR characterization. Marked prostatic enlargement. Electronically Signed   By: Lavonia Dana M.D.   On: 06/06/2018 17:55   Dg Bone Survey Met  Result Date: 06/10/2018 CLINICAL DATA:  Multiple myeloma EXAM: METASTATIC BONE  SURVEY COMPARISON:  None. FINDINGS: Metastatic bone survey was performed. 1. Cervical Spine: Degenerative disc disease C3 through C7. No lytic abnormality. 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 2. Both shoulders: Mild osteoarthritis of the acromioclavicular and glenohumeral joints bilaterally. No lytic abnormality or fracture. No joint dislocation. 3. Humeri: No lytic abnormality. Mild degenerative subcortical cystic change of the right humeral head adjacent to the greater tuberosity. 4. Bilateral forearms: No lytic abnormality of the radius or ulna on either side. Mild degenerative cystic change of the left scaphoid. 5. Lateral skull: Negative aggressive osteolytic lesions. 6. Pain and lateral thoracic spine: Multilevel degenerative disc disease and endplate spurring. Patchy ill-defined appearance of the mid to lower thoracic vertebral bodies. Cannot exclude small lytic foci. Dedicated CT of the thoracic spine may help for better assessment. 7. Lumbar spine: Mild degenerative change with disc flattening and endplate spurring. No aggressive lytic abnormalities. 8. Chest: Cardiomegaly. Aortic atherosclerosis. Clear lungs. No definite lytic abnormality of the visualized ribs. 9. AP pelvis: No fracture nor aggressive lytic abnormality noted. 10. Femora: Negative for lytic abnormality. 11. Tibia and fibula: Negative bilaterally lytic abnormality. IMPRESSION: Slightly patchy ill-defined appearance of the thoracic spine some which is due to overlap of adjacent ribs and lung limiting assessment. The possibility of subtle lytic abnormalities is not excluded as a result. CT may help for better assessment. Otherwise, no aggressive nor suspicious lytic abnormalities are identified. Electronically Signed   By: Ashley Royalty M.D.   On: 06/10/2018 23:57   Ct Bone Marrow Biopsy & Aspiration  Result Date: 06/12/2018 INDICATION: 72 year old with anemia and evaluate for light chain myeloma. EXAM: CT GUIDED  BONE MARROW ASPIRATES AND BIOPSY Physician: Stephan Minister. Anselm Pancoast, MD MEDICATIONS: No antibiotics ANESTHESIA/SEDATION: Fentanyl 25 mcg IV; Versed 0.5 mg IV Moderate Sedation Time: The patient was continuously monitored during the procedure by the interventional radiology nurse under my direct supervision. COMPLICATIONS: None immediate. PROCEDURE: The procedure was explained to the patient. The risks and benefits of the procedure were discussed and the patient's questions were addressed. Informed consent was obtained from the patient. The patient was placed prone on CT table. Images of the pelvis were obtained. The back was prepped and draped in sterile fashion. Maximal barrier sterile technique was utilized including caps, mask, sterile gowns, sterile gloves, sterile drape, hand hygiene and skin antiseptic. The skin and right posterior ilium were anesthetized with 1% lidocaine.  11 gauge bone needle was directed into the right ilium with CT guidance. Two aspirates and one core biopsy were obtained. Bandage placed over the puncture site. FINDINGS: Bone needle was directed into the posterior right ilium. Enlarged prostate. IMPRESSION: CT guided bone marrow aspiration and core biopsy. Electronically Signed   By: Markus Daft M.D.   On: 06/12/2018 19:56   Ct Angio Chest Aorta W &/or Wo Contrast  Result Date: 05/19/2018 CLINICAL DATA:  Yearly follow up thoracic aortic aneurysm. Hx of recent syncopal episodes in [place] and post prandial. EXAM: CT ANGIOGRAPHY CHEST WITH CONTRAST TECHNIQUE: Multidetector CT imaging of the chest was performed using the standard protocol during bolus administration of intravenous contrast. Multiplanar CT image reconstructions and MIPs were obtained to evaluate the vascular anatomy. CONTRAST:  124m ISOVUE-370 IOPAMIDOL (ISOVUE-370) INJECTION 76% COMPARISON:  05/30/2017 and previous FINDINGS: Cardiovascular: Cardiomegaly. No pericardial effusion. Dilated central pulmonary arteries. Fair contrast  opacification of pulmonary artery branches; the exam was not optimized for detection of pulmonary emboli. Mild scattered coronary calcifications. Good contrast opacification of the thoracic aorta without dissection or stenosis. Transverse diameters as follows: 5.2 cm sinuses of Valsalva 4.2 cm  sino-tubular junction 4.6 cm mid ascending (previously 4.3 cm) 4.3 cm distal ascending/proximal arch 3.4 cm distal arch 3.2 cm proximal descending 2.8 cm distal descending Bovine variant brachiocephalic arterial origin anatomy without proximal stenosis. Visualized proximal abdominal aorta unremarkable. Ectatic distal celiac axis up to 1.5 cm diameter. Mediastinum/Nodes: No hilar or mediastinal adenopathy. Lungs/Pleura: No pleural effusion.  No pneumothorax.  Lungs clear. Upper Abdomen: Nodular liver contour. Stable hepatic cysts. Exophytic 5.8 cm upper pole left renal lesion measuring above fluid attenuation. No acute findings. Musculoskeletal: 5 mm subcutaneous metallic density in the left anterior chest wall subcutaneous tissues. Mid and lower thoracic spondylitic change. No fracture or worrisome bone lesion. Review of the MIP images confirms the above findings. IMPRESSION: 1. 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 Recommend semi-annual imaging followup by CTA or MRA and referral to cardiothoracic surgery if not already obtained. This recommendation follows 2010 ACCF/AHA/AATS/ACR/ASA/SCA/SCAI/SIR/STS/SVM Guidelines for the Diagnosis and Management of Patients With Thoracic Aortic Disease. Circulation. 2010; 121: [id number]. 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 Recommend dedicated renal protocol CT or MR with contrast. 3. Coronary calcifications. The severity of coronary artery disease and any potential stenosis cannot be assessed on this non-gated CT examination.  Assessment for potential risk factor modification, dietary therapy or pharmacologic therapy may be warranted, if clinically indicated. 4. Nodular liver contour suggesting cirrhosis. Electronically Signed   By: DLucrezia EuropeM.D.   On: 05/19/2018 13:45     LOS: 11 days   Signature  PLala LundM.D on 06/16/2018 at 10:27 AM  To page go to [email or web address] - password [email or web address]

## 2018-06-16 NOTE — Progress Notes (Signed)
Subjective: Interval History: has no complaint, feels great!.  Objective: Vital signs in last 24 hours: Temp:  [97.6 F (36.4 C)-98 F (36.7 C)] 97.6 F (36.4 C) (10/15 0607) Pulse Rate:  [47-55] 47 (10/15 0607) Resp:  [16-17] 17 (10/14 2048) BP: (130-157)/(70-95) 156/79 (10/15 0607) SpO2:  [98 %-100 %] 100 % (10/15 0607) Weight change:   Intake/Output from previous day: No intake/output data recorded. Intake/Output this shift: No intake/output data recorded.  General appearance: alert, cooperative and no distress Resp: clear to auscultation bilaterally Cardio: S1, S2 normal and systolic murmur: systolic ejection 2/6, crescendo and decrescendo at 2nd left intercostal space GI: soft, non-tender; bowel sounds normal; no masses,  no organomegaly Extremities: extremities normal, atraumatic, no cyanosis or edema  Lab Results: No results for input(s): WBC, HGB, HCT, PLT in the last 72 hours. BMET:  Recent Labs    06/14/18 0505 06/15/18 0436  NA 137 137  K 4.1 4.1  CL 101 99  CO2 23 24  GLUCOSE 127* 133*  BUN 69* 83*  CREATININE 5.30* 5.33*  CALCIUM 9.1 9.2   No results for input(s): PTH in the last 72 hours. Iron Studies: No results for input(s): IRON, TIBC, TRANSFERRIN, FERRITIN in the last 72 hours.  Studies/Results: No results found.  I have reviewed the patient's current medications.  Assessment/Plan: 1 CKD3/AKI.  Combination of Acinetobact/entercoccus UTis, obstruction, LC MM.  Now getting Dex.  Function stable 2 MM awaiting BM bx 3 Anemia will check Fe 4 HTPH check 5 HTn  Controlled P check PTH, Fe, will follow 1-2 d as is stable and can f/u outpatient   LOS: 11 days   [name] [name] 06/16/2018,10:07 AM

## 2018-06-16 NOTE — Progress Notes (Signed)
Physical Therapy Discharge Patient Details Name: [name] [name] MRN: [id number] DOB: [birth year] Today's Date: 06/16/2018 Time: 0223-3612 PT Time Calculation (min) (ACUTE ONLY): 26 min  Patient discharged from PT services secondary to goals met and no further PT needs identified.  Please see latest therapy progress note for current level of functioning and progress toward goals.    Progress and discharge plan discussed with patient and/or caregiver: Patient agrees with plan  GP     [name] [name] Spero Geralds, PTA Acute Rehabilitation Services Pager [phone] Office [phone]   06/16/2018, 2:23 PM

## 2018-06-17 LAB — RENAL FUNCTION PANEL
ANION GAP: 13 (ref 5–15)
Albumin: 3.1 g/dL — ABNORMAL LOW (ref 3.5–5.0)
BUN: 110 mg/dL — ABNORMAL HIGH (ref 8–23)
CHLORIDE: 99 mmol/L (ref 98–111)
CO2: 21 mmol/L — AB (ref 22–32)
CREATININE: 5.57 mg/dL — AB (ref 0.61–1.24)
Calcium: 9.2 mg/dL (ref 8.9–10.3)
GFR, EST AFRICAN AMERICAN: 10 mL/min — AB (ref >60)
GFR, EST NON AFRICAN AMERICAN: 9 mL/min — AB (ref >60)
Glucose, Bld: 118 mg/dL — ABNORMAL HIGH (ref 70–99)
Phosphorus: 5.5 mg/dL — ABNORMAL HIGH (ref 2.5–4.6)
Potassium: 4.1 mmol/L (ref 3.5–5.1)
Sodium: 133 mmol/L — ABNORMAL LOW (ref 135–145)

## 2018-06-17 LAB — PARATHYROID HORMONE, INTACT (NO CA): PTH: 131 pg/mL — ABNORMAL HIGH (ref 15–65)

## 2018-06-17 MED ORDER — FINASTERIDE 5 MG PO TABS: 5.0000 mg | ORAL_TABLET | Freq: Every day | ORAL | 0 refills | Status: DC

## 2018-06-17 MED ORDER — HYDRALAZINE HCL 50 MG PO TABS: 50.0000 mg | ORAL_TABLET | Freq: Three times a day (TID) | ORAL | 0 refills | Status: DC

## 2018-06-17 NOTE — Discharge Instructions (Signed)
Follow with Primary MD Nolene Ebbs, MD in 7 days   Get CBC, CMP, 2 view Chest X ray checked  by Primary MD in 5-7 days   Activity: As tolerated with Full fall precautions use walker/cane & assistance as needed  Disposition Home   Diet: Heart Healthy     Special Instructions: If you have smoked or chewed Tobacco  in the last 2 yrs please stop smoking, stop any regular Alcohol  and or any Recreational drug use.  On your next visit with your primary care physician please Get Medicines reviewed and adjusted.  Please request your Prim.MD to go over all Hospital Tests and Procedure/Radiological results at the follow up, please get all Hospital records sent to your Prim MD by signing hospital release before you go home.  If you experience worsening of your admission symptoms, develop shortness of breath, life threatening emergency, suicidal or homicidal thoughts you must seek medical attention immediately by calling 911 or calling your MD immediately  if symptoms less severe.  You Must read complete instructions/literature along with all the possible adverse reactions/side effects for all the Medicines you take and that have been prescribed to you. Take any new Medicines after you have completely understood and accpet all the possible adverse reactions/side effects.

## 2018-06-17 NOTE — Discharge Summary (Signed)
DANE KOPKE KGY:[id number] DOB: [birth year] DOA: 06/05/2018  PCP: Nolene Ebbs, MD  Admit date: 06/05/2018  Discharge date: 06/17/2018  Admitted From: Home  Disposition:  Home   Recommendations for Outpatient Follow-up:   Follow up with PCP in 1-2 weeks  PCP Please obtain BMP/CBC, 2 view CXR in 1week,  (see Discharge instructions)   PCP Please follow up on the following pending results:    Home Health: None   Equipment/Devices: None  Consultations: Haem, Renal Discharge Condition: Fair   CODE STATUS: Full   Diet Recommendation: Heart Healthy     Chief Complaint  Patient presents with  . Fever     Brief history of present illness from the day of admission and additional interim summary    Patient is a 72 y.o. male with history of CAD, hypertension, PAF not on anticoagulation-recent right hemicolectomy for a cecal mass-just discharged by the general surgical service on 0/26-VZCHYIFO course was complicated by development of obstructive uropathy and AKI ( discharged with a Foley catheter, successful outpatient voiding trial on 10/1)-presented to the hospital with sepsis secondary to Acinetobacter bacteremia, acute kidney injury.  Further work-up demonstrates significantly elevated serum kappa light chains, mildly positive ANCA serology.  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                                                                  Hospital Course    Sepsis secondary to Acinetobacter bacteremia along with Acinetobacter and enterococcal UTI: Secondary to recent hospitalization for laparotomy/recent Foley catheter placement-sepsis pathophysiology has resolved-remains on IV Unasyn-stop date of 10/12.  Repeat cultures remain negative continue Unasyn and  monitor clinically.  Sepsis pathophysiology has completely resolved.  AKI on CKD stage III: Initially thought to have hemodynamically mediated kidney injury with some contribution from obstructive uropathy.  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  Serum creatinine along with urine output remain stable, post void bladder scans are stable as well, case discussed with nephrologist Dr. Joelyn Oms bedside on 06/14/2018.  No need for plasmapheresis or dialysis at this time, will be discharged with outpatient follow-up with Dr. Joelyn Oms..  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    Seen by both oncology and nephrology, underwent 4 days treatment of oral Decadron which she has finished yesterday, bone marrow biopsy was done and results consistent with light chain multiple myeloma, case discussed with oncologist Dr. Martin Majestic LE, stable for discharge and outpatient follow-up with him.  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  Required second unit of PRBC transfusion  on 10/11-hemoglobin stable posttransfusion-follow CBC periodically.   Hypokalemia: Repleted and stable.  BPH with recent urinary retention/obstructive uropathy requiring Foley catheter placement: Foley catheter was inserted on 10/5 due to worsening renal function and concern for obstructive uropathy contributing to AKI-with improving renal function-and leaking Foley catheter-this catheter was discontinued on 10/9.  Continue to monitor closely-with frequent bladder scans.  Continue Flomax and Proscar.   Chronic diastolic heart failure: Remains euvolemic-follow volume status closely.  Hypertension: Blood pressure was elevated continue Norvasc have added  hydralazine for better control, follow with PCP for further adjustment as needed.  CAD: No anginal symptoms-follow.  Cecal polyp-underwent right colectomy on 9/19- biopsy positive for invasive adenocarcinoma-lymph nodes/surgical margins were negative.  With PCP and primary surgeon as before.  Questionable renal cell carcinoma on CT scan.  Outpatient urology follow-up post discharge.   Discharge diagnosis     Active Problems:   Benign prostatic hyperplasia without urinary obstruction   Diabetes (HCC)   CAD (coronary artery disease)   Chronic diastolic CHF (congestive heart failure) (HCC)   Hypertensive heart disease   Sepsis ([place])   Acute lower UTI   AKI (acute kidney injury) ([place])   Anemia    Discharge instructions    Discharge Instructions    Diet - low sodium heart healthy   Complete by:  As directed    Discharge instructions   Complete by:  As directed    Follow with Primary MD Nolene Ebbs, MD in 7 days   Get CBC, CMP, 2 view Chest X ray checked  by Primary MD in 5-7 days   Activity: As tolerated with Full fall precautions use walker/cane & assistance as needed  Disposition Home   Diet: Heart Healthy     Special Instructions: If you have smoked or chewed Tobacco  in the last 2 yrs please stop smoking, stop any regular Alcohol  and or any Recreational drug use.  On your next visit with your primary care physician please Get Medicines reviewed and adjusted.  Please request your Prim.MD to go over all Hospital Tests and Procedure/Radiological results at the follow up, please get all Hospital records sent to your Prim MD by signing hospital release before you go home.  If you experience worsening of your admission symptoms, develop shortness of breath, life threatening emergency, suicidal or homicidal thoughts you must seek medical attention immediately by calling 911 or calling your MD immediately  if symptoms less severe.  You Must read complete  instructions/literature along with all the possible adverse reactions/side effects for all the Medicines you take and that have been prescribed to you. Take any new Medicines after you have completely understood and accpet all the possible adverse reactions/side effects.   Increase activity slowly   Complete by:  As directed       Discharge Medications   Allergies as of 06/17/2018      Reactions   Amoxicillin Other (See Comments)   Tolerates Unasyn. Can't move Has patient had a PCN reaction causing immediate rash, facial/tongue/throat swelling, SOB or lightheadedness with hypotension: No Has patient had a PCN reaction causing severe rash involving mucus membranes or skin necrosis: No Has patient had a PCN reaction that required hospitalization No Has patient had a PCN reaction occurring within the last 10 years: No If all of the above answers are "NO", then may proceed with Cephalosporin use.   Penicillins Other (See Comments)   Tolerates Unasyn. Can't move/dizziness Has patient had a PCN reaction causing immediate rash,  facial/tongue/throat swelling, SOB or lightheadedness with hypotension: No Has patient had a PCN reaction causing severe rash involving mucus membranes or skin necrosis: No Has patient had a PCN reaction that required hospitalization No Has patient had a PCN reaction occurring within the last 10 years: No If all of the above answers are "NO", then may proceed with Cephalosporin use. Other reaction(s): O      Medication List    STOP taking these medications   KLOR-CON M20 20 MEQ tablet Generic drug:  potassium chloride SA     TAKE these medications   albuterol 108 (90 Base) MCG/ACT inhaler Commonly known as:  PROVENTIL HFA;VENTOLIN HFA Inhale 2 puffs into the lungs every 6 (six) hours as needed for wheezing or shortness of breath.   albuterol (5 MG/ML) 0.5% nebulizer solution Commonly known as:  PROVENTIL Take 0.5 mLs (2.5 mg total) by nebulization every 6  (six) hours as needed for wheezing or shortness of breath.   amLODipine 10 MG tablet Commonly known as:  NORVASC Take 10 mg by mouth 2 (two) times daily.   atorvastatin 80 MG tablet Commonly known as:  LIPITOR Take 1 tablet (80 mg total) by mouth daily. What changed:    how much to take  when to take this   finasteride 5 MG tablet Commonly known as:  PROSCAR Take 1 tablet (5 mg total) by mouth daily.   hydrALAZINE 50 MG tablet Commonly known as:  APRESOLINE Take 1 tablet (50 mg total) by mouth every 8 (eight) hours.   tamsulosin 0.4 MG Caps capsule Commonly known as:  FLOMAX Take 1 capsule (0.4 mg total) by mouth daily.       Follow-up Information    Care, [facility] Follow up.   Specialty:  Home Health Services Why:  home health services arranged Contact information: [place] [address] [phone]        Brunetta Genera, MD. Schedule an appointment as soon as possible for a visit in 5 day(s).   Specialties:  Hematology, Oncology Why:  Multiple myeloma Contact information: [place] [address] [phone]        Rexene Agent, MD. Schedule an appointment as soon as possible for a visit in 1 week(s).   Specialty:  Nephrology Why:  Myeloma kidney CKD 5 Contact information: [place] [address] [phone]        Alexis Frock, MD. Schedule an appointment as soon as possible for a visit in 1 week(s).   Specialty:  Urology Why:  Questionable renal cell carcinoma on CT scan Contact information: [place] [address] [phone]           Major procedures and Radiology Reports - PLEASE review detailed and final reports thoroughly  -      Bone Marrow Biopsy  Diagnosis Bone Marrow, Aspirate,Biopsy, and Clot, right ilium - HYPERCELLULAR BONE MARROW WITH PLASMA CELL NEOPLASM. - SEE COMMENT. PERIPHERAL BLOOD: - NORMOCYTIC-NORMOCHROMIC  ANEMIA. - THROMBOCYTOSIS. Diagnosis Note The bone marrow shows increased number of atypical plasma cells representing 40% of all cells associated with interstitial infiltrates and numerous variably sized clusters. The plasma cells display kappa light chain restriction consistent with plasma cell neoplasm. The background shows trilineage hematopoiesis with generally nonspecific changes. Correlation with cytogenetic and FISH studies is recommended.  Ct Abdomen Pelvis Wo Contrast  Result Date: 06/05/2018 CLINICAL DATA:  Abdominal pain, fever.  Recent partial colectomy. EXAM: CT ABDOMEN AND  PELVIS WITHOUT CONTRAST TECHNIQUE: Multidetector CT imaging of the abdomen and pelvis was performed following the standard protocol without IV contrast. COMPARISON:  05/30/2018 FINDINGS: Lower chest: Cardiomegaly.  Bibasilar atelectasis.  No effusions. Hepatobiliary: Small scattered hepatic cysts, small scattered hepatic low-density lesions throughout the liver, likely cysts although these cannot be characterized fully without intravenous contrast. Gallbladder unremarkable. Pancreas: No focal abnormality or ductal dilatation. Spleen: No focal abnormality.  Normal size. Adrenals/Urinary Tract: Numerous low-density, mixed density and high-density masses throughout the kidney are stable since recent study and cannot be characterized fully without intravenous contrast. At least 2 of these masses were shown on prior contrast enhanced CT to be suspicious for renal cell carcinoma. No hydronephrosis. Adrenal glands unremarkable. Urinary bladder decompressed and mildly thick walled, likely related to prostate enlargement. Stomach/Bowel: Descending colonic and sigmoid diverticulosis. Postoperative changes in the right colon from right hemicolectomy, stable. Again seen is the ventral hernia containing a portion of the mid transverse colon which appears unremarkable. No evidence of obstruction or bowel wall thickening. Stomach and  small bowel grossly unremarkable. No evidence of bowel obstruction. Vascular/Lymphatic: Aortic atherosclerosis. No enlarged abdominal or pelvic lymph nodes. Reproductive: Marked enlargement of the prostate. Other: No free fluid or free air. Continued wall thickening within the periphery of the midline supraumbilical ventral hernia which also contains the transverse colon. Again, no underlying bowel wall thickening. This has a stable appearance since prior study. Musculoskeletal: No acute bony abnormality. Diffuse degenerative changes in the lumbar spine. IMPRESSION: Stable appearance of prior right hemicolectomy. Stable supraumbilical ventral hernia containing a portion of the transverse colon. The wall of the ventral hernia again appears thickened and indistinct, but underlying bowel does not appear abnormal. Scattered diverticulosis.  No active diverticulitis. Marked prostate enlargement with bladder wall thickening. Multiple renal lesions. At least 2 of which were shown on prior contrast-enhanced CT to be suspicious for renal cell carcinoma. No change. Scattered hepatic low-density lesions cannot be characterized without intravenous contrast, likely cysts. Cardiomegaly, bibasilar atelectasis. Aortic atherosclerosis. Electronically Signed   By: Rolm Baptise M.D.   On: 06/05/2018 08:22   Ct Abdomen Pelvis Wo Contrast  Result Date: 05/30/2018 CLINICAL DATA:  Partial right colectomy, perform laparoscopically. EXAM: CT ABDOMEN AND PELVIS WITHOUT CONTRAST TECHNIQUE: Multidetector CT imaging of the abdomen and pelvis was performed following the standard protocol without IV contrast. COMPARISON:  March 24, 2018 FINDINGS: Lower chest: Cardiomegaly.  No other abnormalities. Hepatobiliary: Multiple low-attenuation lesions in the liver are most likely cysts, unchanged. The liver and gallbladder are otherwise unremarkable. Pancreas: Unremarkable. No pancreatic ductal dilatation or surrounding inflammatory changes. Spleen:  Normal in size without focal abnormality. Adrenals/Urinary Tract: Adrenal glands are normal. A 4.1 cm apparently solid mass is seen in the upper pole of the left kidney measuring 3.9 cm previously. A 2.7 cm mass in the medial left kidney on series 2, image 28 is essentially unchanged and nonspecific. Several renal cysts are noted. There is a solid and cystic appearing mass in the lower pole of the right kidney difficult to compare to the previous study due to lack of contrast today. However, the maximum measurement is approximately 4.6 cm, unchanged. Several probable hyperdense cysts are noted. The bladder is decompressed with a Foley catheter. There is mild fat stranding adjacent to the bladder which is a new finding. No renal stones or hydronephrosis. No ureteral stones. Stomach/Bowel: The stomach and small bowel are normal. The patient is status post partial right colectomy scattered colonic diverticuli are noted. There is  a new ventral hernia, located superior to the previously identified umbilical hernia. This is best seen on series 2, image 35. There is fat stranding within the hernia which likely contains omentum. It also contains a portion of transverse colon which does not appear to be inflamed. The wall of the hernia is thickened and inflamed. Surgical changes are seen in the right lower abdomen at the site of right colectomy. There is some increased attenuation in the surrounding fat, likely postoperative. No collection to suggest abscess or leak. The appendix is been removed along with the right partial colectomy. Vascular/Lymphatic: Mild atherosclerotic changes in the aorta. No obvious adenopathy. Reproductive: Again noted is an enlarged prostate exerting mass effect on the posterior bladder. Other: No abdominal wall hernia or abnormality. No abdominopelvic ascites. Musculoskeletal: No acute or significant osseous findings. IMPRESSION: 1. At least 2 renal masses concerning for renal cell carcinoma. The  masses were better assessed with a previous contrast-enhanced study. Recommend an MRI when the patient is able. 2. There is a new ventral hernia. The periphery of the hernia/hernia wall is thickened and inflamed. The hernia contains transverse colon which does not appear to be inflamed or obstructed. There is also likely some herniated omentum with associated fat stranding. This hernia is new compared to March 24, 2018. No associated bowel obstruction. 3. Inflammatory changes in the right lower quadrant, surrounding the site of right partial colectomy. These findings are likely postsurgical as surgery was only just over 1 week ago. No evidence of leak or abscess. 4. Atherosclerotic changes in the abdominal aorta. Electronically Signed   By: Dorise Bullion III M.D   On: 05/30/2018 12:24   Dg Chest 2 View  Result Date: 06/05/2018 CLINICAL DATA:  Shortness of breath. EXAM: CHEST - 2 VIEW COMPARISON:  Chest CT 05/19/2018 FINDINGS: Chronic cardiomegaly and aortic tortuosity. No pulmonary edema, pleural effusion, focal airspace disease or pneumothorax. No acute osseous abnormalities. IMPRESSION: Cardiomegaly without congestive failure or acute chest finding. Electronically Signed   By: Keith Rake M.D.   On: 06/05/2018 03:21   US Renal  Result Date: 06/06/2018 CLINICAL DATA:  Acute kidney injury, history coronary artery disease, chronic diastolic CHF, COPD, type II diabetes mellitus, hypertension, hypertensive heart disease, prostate cancer, renal cell carcinoma EXAM: RENAL / URINARY TRACT ULTRASOUND COMPLETE COMPARISON:  CT abdomen and pelvis 06/05/2017 FINDINGS: Right Kidney: Length: 14.5 cm. Normal cortical thickness. Upper normal echogenicity. Cystic lesion at mid kidney, 2.7 x 2.2 x 3.0 cm, containing a few scattered low level internal echoes; this lesion is hyperdense on the prior CT exam. An additional nodule at the inferior pole measures 3.9 x 2.0 x 2.7 cm and demonstrates multiple septations and  low-level scattered internal echogenicity. This lesion is mildly complicated and demonstrated low-attenuation and high attenuation components on the prior CT. No hydronephrosis or shadowing calcification. Left Kidney: Length: 16.7 cm. Normal cortical thickness. Minimally increased cortical echogenicity. Small simple cyst at inferior pole, 3.9 x 2.0 x 2.7 cm. Additional complicated cyst at upper pole 2.5 x 2.1 x 2.9 cm with a scattered internal echoes. Additional mass at upper pole appear solid, 4.9 x 4.5 x 4.5 cm, highly suspicious for a renal neoplasm. No hydronephrosis or shadowing calcification. Bladder: Normal appearing bladder. Marked prostatic enlargement, 9.7 x 4.5 x 6.8 cm IMPRESSION: Probable medical renal disease changes of the kidneys. BILATERAL simple renal cysts. Additional solid mass at upper pole LEFT kidney 4.9 cm greatest size most likely representing a solid renal neoplasm, demonstrated enhancement on  a prior CT exam of 03/24/2018 and now demonstrates an interval increase in size. Additional complicated cystic lesions at the inferior pole of RIGHT kidney and upper pole LEFT kidney, indeterminate, cannot exclude renal neoplasms; consider MR characterization. Marked prostatic enlargement. Electronically Signed   By: Lavonia Dana M.D.   On: 06/06/2018 17:55   Dg Bone Survey Met  Result Date: 06/10/2018 CLINICAL DATA:  Multiple myeloma EXAM: METASTATIC BONE SURVEY COMPARISON:  None. FINDINGS: Metastatic bone survey was performed. 1. Cervical Spine: Degenerative disc disease C3 through C7. No lytic abnormality. 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 2. Both shoulders: Mild osteoarthritis of the acromioclavicular and glenohumeral joints bilaterally. No lytic abnormality or fracture. No joint dislocation. 3. Humeri: No lytic abnormality. Mild degenerative subcortical cystic change of the right humeral head adjacent to the greater tuberosity. 4. Bilateral forearms: No lytic  abnormality of the radius or ulna on either side. Mild degenerative cystic change of the left scaphoid. 5. Lateral skull: Negative aggressive osteolytic lesions. 6. Pain and lateral thoracic spine: Multilevel degenerative disc disease and endplate spurring. Patchy ill-defined appearance of the mid to lower thoracic vertebral bodies. Cannot exclude small lytic foci. Dedicated CT of the thoracic spine may help for better assessment. 7. Lumbar spine: Mild degenerative change with disc flattening and endplate spurring. No aggressive lytic abnormalities. 8. Chest: Cardiomegaly. Aortic atherosclerosis. Clear lungs. No definite lytic abnormality of the visualized ribs. 9. AP pelvis: No fracture nor aggressive lytic abnormality noted. 10. Femora: Negative for lytic abnormality. 11. Tibia and fibula: Negative bilaterally lytic abnormality. IMPRESSION: Slightly patchy ill-defined appearance of the thoracic spine some which is due to overlap of adjacent ribs and lung limiting assessment. The possibility of subtle lytic abnormalities is not excluded as a result. CT may help for better assessment. Otherwise, no aggressive nor suspicious lytic abnormalities are identified. Electronically Signed   By: Ashley Royalty M.D.   On: 06/10/2018 23:57   Ct Bone Marrow Biopsy & Aspiration  Result Date: 06/12/2018 INDICATION: 72 year old with anemia and evaluate for light chain myeloma. EXAM: CT GUIDED BONE MARROW ASPIRATES AND BIOPSY Physician: Stephan Minister. Anselm Pancoast, MD MEDICATIONS: No antibiotics ANESTHESIA/SEDATION: Fentanyl 25 mcg IV; Versed 0.5 mg IV Moderate Sedation Time: The patient was continuously monitored during the procedure by the interventional radiology nurse under my direct supervision. COMPLICATIONS: None immediate. PROCEDURE: The procedure was explained to the patient. The risks and benefits of the procedure were discussed and the patient's questions were addressed. Informed consent was obtained from the patient. The patient was  placed prone on CT table. Images of the pelvis were obtained. The back was prepped and draped in sterile fashion. Maximal barrier sterile technique was utilized including caps, mask, sterile gowns, sterile gloves, sterile drape, hand hygiene and skin antiseptic. The skin and right posterior ilium were anesthetized with 1% lidocaine. 11 gauge bone needle was directed into the right ilium with CT guidance. Two aspirates and one core biopsy were obtained. Bandage placed over the puncture site. FINDINGS: Bone needle was directed into the posterior right ilium. Enlarged prostate. IMPRESSION: CT guided bone marrow aspiration and core biopsy. Electronically Signed   By: Markus Daft M.D.   On: 06/12/2018 19:56   Ct Angio Chest Aorta W &/or Wo Contrast  Result Date: 05/19/2018 CLINICAL DATA:  Yearly follow up thoracic aortic aneurysm. Hx of recent syncopal episodes in [place] and post prandial. EXAM: CT ANGIOGRAPHY CHEST WITH CONTRAST TECHNIQUE: Multidetector CT imaging of the chest was performed using the standard protocol during  bolus administration of intravenous contrast. Multiplanar CT image reconstructions and MIPs were obtained to evaluate the vascular anatomy. CONTRAST:  15m ISOVUE-370 IOPAMIDOL (ISOVUE-370) INJECTION 76% COMPARISON:  05/30/2017 and previous FINDINGS: Cardiovascular: Cardiomegaly. No pericardial effusion. Dilated central pulmonary arteries. Fair contrast opacification of pulmonary artery branches; the exam was not optimized for detection of pulmonary emboli. Mild scattered coronary calcifications. Good contrast opacification of the thoracic aorta without dissection or stenosis. Transverse diameters as follows: 5.2 cm sinuses of Valsalva 4.2 cm  sino-tubular junction 4.6 cm mid ascending (previously 4.3 cm) 4.3 cm distal ascending/proximal arch 3.4 cm distal arch 3.2 cm proximal descending 2.8 cm distal descending Bovine variant brachiocephalic arterial origin anatomy without proximal stenosis.  Visualized proximal abdominal aorta unremarkable. Ectatic distal celiac axis up to 1.5 cm diameter. Mediastinum/Nodes: No hilar or mediastinal adenopathy. Lungs/Pleura: No pleural effusion.  No pneumothorax.  Lungs clear. Upper Abdomen: Nodular liver contour. Stable hepatic cysts. Exophytic 5.8 cm upper pole left renal lesion measuring above fluid attenuation. No acute findings. Musculoskeletal: 5 mm subcutaneous metallic density in the left anterior chest wall subcutaneous tissues. Mid and lower thoracic spondylitic change. No fracture or worrisome bone lesion. Review of the MIP images confirms the above findings. IMPRESSION: 1. 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 Recommend semi-annual imaging followup by CTA or MRA and referral to cardiothoracic surgery if not already obtained. This recommendation follows 2010 ACCF/AHA/AATS/ACR/ASA/SCA/SCAI/SIR/STS/SVM Guidelines for the Diagnosis and Management of Patients With Thoracic Aortic Disease. Circulation. 2010; 121: [id number]. 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 Recommend dedicated renal protocol CT or MR with contrast. 3. Coronary calcifications. The severity of coronary artery disease and any potential stenosis cannot be assessed on this non-gated CT examination. Assessment for potential risk factor modification, dietary therapy or pharmacologic therapy may be warranted, if clinically indicated. 4. Nodular liver contour suggesting cirrhosis. Electronically Signed   By: DLucrezia EuropeM.D.   On: 05/19/2018 13:45    Micro Results     Recent Results (from the past 240 hour(s))  Culture, blood (routine x 2)     Status: None   Collection Time: 06/10/18  4:50 PM  Result Value Ref Range Status   Specimen Description BLOOD LEFT HAND  Final   Special Requests   Final    BOTTLES DRAWN AEROBIC ONLY Blood Culture  adequate volume   Culture   Final    NO GROWTH 6 DAYS [facility]formed at [facility] [address], [place] [address]   Report Status 06/16/2018 FINAL  Final  Culture, blood (routine x 2)     Status: None   Collection Time: 06/10/18  5:00 PM  Result Value Ref Range Status   Specimen Description BLOOD RIGHT ANTECUBITAL  Final   Special Requests   Final    BOTTLES DRAWN AEROBIC ONLY Blood Culture adequate volume   Culture   Final    NO GROWTH 6 DAYS [facility] at [facility] [address] [place] [id number]   Report Status 06/16/2018 FINAL  Final    Today   Subjective    Terek Forstrom today has no headache,no chest abdominal pain,no new weakness tingling or numbness, feels much better wants to go home today.     Objective   Blood pressure (!) 155/86, pulse (!) 51, temperature (!) 97.4 F (36.3 C), temperature source  Oral, resp. rate 18, height 5' 6"  (1.676 m), weight 104.2 kg, SpO2 100 %.   Intake/Output Summary (Last 24 hours) at 06/17/2018 0830 Last data filed at 06/17/2018 0610 Gross per 24 hour  Intake 400 ml  Output 1250 ml  Net -850 ml    Exam Awake Alert, Oriented x 3, No new F.N deficits, Normal affect [place].AT,PERRAL Supple Neck,No JVD, No cervical lymphadenopathy appriciated.  Symmetrical Chest wall movement, Good air movement bilaterally, CTAB RRR,No Gallops,Rubs or new Murmurs, No Parasternal Heave +ve B.Sounds, Abd Soft, Non tender, No organomegaly appriciated, No rebound -guarding or rigidity. No Cyanosis, Clubbing or edema, No new Rash or bruise   Data Review   CBC w Diff:  Lab Results  Component Value Date   WBC 6.1 06/13/2018   HGB 8.5 (L) 06/13/2018   HGB 10.6 (L) 01/07/2018   HCT 25.7 (L) 06/13/2018   HCT 31.3 (L) 01/07/2018   PLT 392 06/13/2018   PLT 293 01/07/2018   LYMPHOPCT 24 06/12/2018   MONOPCT 10 06/12/2018   EOSPCT 2 06/12/2018   BASOPCT 0 06/12/2018    CMP:  Lab Results  Component Value Date   NA  133 (L) 06/17/2018   NA 139 01/07/2018   K 4.1 06/17/2018   CL 99 06/17/2018   CO2 21 (L) 06/17/2018   BUN 110 (H) 06/17/2018   BUN 29 (H) 01/07/2018   CREATININE 5.57 (H) 06/17/2018   CREATININE 1.01 03/29/2016   PROT 6.8 06/05/2018   ALBUMIN 3.1 (L) 06/17/2018   BILITOT 1.7 (H) 06/05/2018   ALKPHOS 78 06/05/2018   AST 26 06/05/2018   ALT 18 06/05/2018  .   Total Time in preparing paper work, data evaluation and todays exam - 45 minutes  Lala Lund M.D on 06/17/2018 at 8:30 AM  [facility]   Office  [phone]

## 2018-06-17 NOTE — Progress Notes (Signed)
Subjective: Interval History: has no complaint . Feels great..  Objective: Vital signs in last 24 hours: Temp:  [97.4 F (36.3 C)-98.2 F (36.8 C)] 97.4 F (36.3 C) (10/16 0415) Pulse Rate:  [51-57] 51 (10/16 0415) Resp:  [18] 18 (10/16 0415) BP: (130-158)/(79-96) 155/86 (10/16 0608) SpO2:  [99 %-100 %] 100 % (10/16 0415) Weight:  [103.1 kg-104.2 kg] 104.2 kg (10/16 0500) Weight change:   Intake/Output from previous day: 10/15 0701 - 10/16 0700 In: 400 [P.O.:400] Out: 1250 [Urine:1250] Intake/Output this shift: No intake/output data recorded.  General appearance: alert, cooperative, mild distress and mildly obese Resp: clear to auscultation bilaterally Cardio: S1, S2 normal and systolic murmur: systolic ejection 2/6, crescendo and decrescendo at 2nd left intercostal space GI: soft, non-tender; bowel sounds normal; no masses,  no organomegaly Extremities: edema 1+  Lab Results: No results for input(s): WBC, HGB, HCT, PLT in the last 72 hours. BMET:  Recent Labs    06/15/18 0436 06/17/18 0359  NA 137 133*  K 4.1 4.1  CL 99 99  CO2 24 21*  GLUCOSE 133* 118*  BUN 83* 110*  CREATININE 5.33* 5.57*  CALCIUM 9.2 9.2   No results for input(s): PTH in the last 72 hours. Iron Studies:  Recent Labs    06/16/18 1031  IRON 104  TIBC 332    Studies/Results: No results found.  I have reviewed the patient's current medications.  Assessment/Plan: 1 AKI no recovery but stable.  Can see outpatient 2 MM BM pending 3 Urosepsis/obstruction stabl 4 CKD 3 5 Renal mass P D/c per primary, f/u with Dr. Joelyn Oms    LOS: 12 days   [name] [name] 06/17/2018,8:58 AM

## 2018-06-17 NOTE — Progress Notes (Signed)
Edinson A Heidt to be D/C'd Home per MD order.  Discussed prescriptions and follow up appointments with the patient. Prescriptions given to patient, medication list explained in detail. Pt verbalized understanding.  Allergies as of 06/17/2018      Reactions   Amoxicillin Other (See Comments)   Tolerates Unasyn. Can't move Has patient had a PCN reaction causing immediate rash, facial/tongue/throat swelling, SOB or lightheadedness with hypotension: No Has patient had a PCN reaction causing severe rash involving mucus membranes or skin necrosis: No Has patient had a PCN reaction that required hospitalization No Has patient had a PCN reaction occurring within the last 10 years: No If all of the above answers are "NO", then may proceed with Cephalosporin use.   Penicillins Other (See Comments)   Tolerates Unasyn. Can't move/dizziness Has patient had a PCN reaction causing immediate rash, facial/tongue/throat swelling, SOB or lightheadedness with hypotension: No Has patient had a PCN reaction causing severe rash involving mucus membranes or skin necrosis: No Has patient had a PCN reaction that required hospitalization No Has patient had a PCN reaction occurring within the last 10 years: No If all of the above answers are "NO", then may proceed with Cephalosporin use. Other reaction(s): O      Medication List    STOP taking these medications   KLOR-CON M20 20 MEQ tablet Generic drug:  potassium chloride SA     TAKE these medications   albuterol 108 (90 Base) MCG/ACT inhaler Commonly known as:  PROVENTIL HFA;VENTOLIN HFA Inhale 2 puffs into the lungs every 6 (six) hours as needed for wheezing or shortness of breath.   albuterol (5 MG/ML) 0.5% nebulizer solution Commonly known as:  PROVENTIL Take 0.5 mLs (2.5 mg total) by nebulization every 6 (six) hours as needed for wheezing or shortness of breath.   amLODipine 10 MG tablet Commonly known as:  NORVASC Take 10 mg by mouth 2 (two) times  daily.   atorvastatin 80 MG tablet Commonly known as:  LIPITOR Take 1 tablet (80 mg total) by mouth daily. What changed:    how much to take  when to take this   finasteride 5 MG tablet Commonly known as:  PROSCAR Take 1 tablet (5 mg total) by mouth daily.   hydrALAZINE 50 MG tablet Commonly known as:  APRESOLINE Take 1 tablet (50 mg total) by mouth every 8 (eight) hours.   tamsulosin 0.4 MG Caps capsule Commonly known as:  FLOMAX Take 1 capsule (0.4 mg total) by mouth daily.       Vitals:   06/17/18 0415 06/17/18 0608  BP: (!) 152/82 (!) 155/86  Pulse: (!) 51   Resp: 18   Temp: (!) 97.4 F (36.3 C)   SpO2: 100%     Skin clean, dry and intact without evidence of skin break down, no evidence of skin tears noted. IV catheter discontinued intact. Site without signs and symptoms of complications. Dressing and pressure applied. Pt denies pain at this time. No complaints noted.  An After Visit Summary was printed and given to the patient. Patient escorted via [place], and D/C home via private auto.  Chapman Fitch BSN, RN [employer] [address] [phone]

## 2018-06-18 ENCOUNTER — Telehealth: Payer: Self-pay | Admitting: Hematology

## 2018-06-18 NOTE — Telephone Encounter (Signed)
Cld and spoke to the pt's son [name] [name] to schedule [name] [name] to see Dr. Irene Limbo on 10/18 at 1230pm. Aware that his father should arrive 30 minutes early to be checked in on time.

## 2018-06-18 NOTE — Progress Notes (Signed)
HEMATOLOGY/ONCOLOGY CONSULTATION NOTE  Date of Service: 06/19/2018  Patient Care Team: Nolene Ebbs, MD as PCP - General (Internal Medicine) Minus Breeding, MD as PCP - Cardiology (Cardiology)  CHIEF COMPLAINTS/PURPOSE OF CONSULTATION:  Newly diagnose Light chain myeloma   HISTORY OF PRESENTING ILLNESS:   [name] [name] is a wonderful 72 y.o. male who has been referred to [place] by Dr. 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 The pt reports that he is doing well overall.   Prior to our visit, the pt had a segmental colon resection for invasive adenocarcinoma on 05/21/18. The pt then developed BPH with urinary obstruction, and presented to the ED for fever, diarrhea, and was evaluated to have sepsis. As part of his elevated creatinine work up, he was seen to have concerning serum free light chain elevation, as noted below.   The pt reports that he is moving a lot of urine and has a catheter. He denies losing weight unexpectedly recently. He notes that he is eating well but is feeling constipated.   The pt denies any back pains or new/different headaches. The pt notes that he has been anemic before, most recently 3-4 months ago and was sent to a nephrologist but wasn't able to establish care due to challenge with distance.   The pt denies any fevers or chills. He received a unit of blood today as well.   The pt notes that he lives with a granddaughter and functions independently.   Most recent lab results (06/10/18) of CBC is as follows: all values are WNL except for RBC at 2.21, HGB at 6.8, HCT at 21.8. 06/07/18 SFLC revealed Kappa at 7790.4, Lambda at 34.3, and K:L ratio at 227.13  On review of systems, pt reports some constipation, improved energy levels, improved breathing, urinating, and denies bone pains, back pains, leg swelling, abdominal pains, and any other symptoms.   Interval History:   [name] [name] returns today for management and evaluation of his  light chain myeloma. The patient's last visit with [place] was on 06/05/18. The pt reports that he is doing well overall.   The pt reports that he has had some loose stools since his previous surgery, and still feels a little weak but notes that this is improving. He notes that he has been eating better as well and has chosen to walk intermittently. The pt notes that he continues on Flomax and has been able to urinate without difficulty. He will see his nephrologist Dr. [name] next week. The pt notes that he will be seeing a urologist soon, and last saw his urologist prior to his recent hospital admission.   The pt notes that his toes have healed after discharge as well, which previously had a fungal infection.   The pt reports that he has not had any peripheral neuropathy.   The pt notes that he was made aware of his abnormal protein four years ago, when he was seeing Dr. Lissa Merlin at [facility]. He notes that follow up was difficult for him and wasn't able to continue follow up.   Of note since the patient's last visit, pt has had Bone Survey completed on 06/10/18 with results revealing Slightly patchy ill-defined appearance of the thoracic spine some which is due to overlap of adjacent ribs and lung limiting assessment. The possibility of subtle lytic abnormalities is not excluded as a result. CT may help for better assessment. Otherwise, no aggressive nor suspicious lytic abnormalities are identified.  Lab results (06/13/18) of CBC, prior to blood transfusion, is as follows: all values are WNL except for RBC at 2.71, HGB at 8.5, HCT at 25.7. Last Renal Function Panel from 06/17/18 revealed all values WNL except for Sodium at 133, CO2 at 21, Glucose at 118, BUN at 110, Creatinine at 5.57, Phosphorous at 5.5, Albumin at 3.1, GFR at 10  On review of systems, pt reports eating better, moving his bowels, urinating well, staying hydrated, healed surgical incision, and denies passing out, abdominal pains,  leg swelling, and any other symptoms.   MEDICAL HISTORY:  Past Medical History:  Diagnosis Date  . Abnormal LFTs (liver function tests)   . Anxiety   . Aortic root dilatation ([place])    a. 12/2015 Echo: mod dil of sinus of valsalva and Ao root (37m).  b. 53 mm Echo 2018  . BPH with urinary obstruction   . Bradycardia   . 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 CAD (coronary artery disease)    a. 12/2015 NSTEMI/Cath (in setting of PAF):  LM nl, LAD 30p,  LCX 399mRCA  ok, AM 100%, RPDA1 40, RPDA 2 60 ->Med Rx.  . Cervical pain 12/09/2014  . Childhood asthma   . Chronic diastolic CHF (congestive heart failure) ([facility]   a. 12/2015 Echo: EF 50-55%, mod AI, mod Ao root dil, mild MR, mod dil LA, mod RA.  . Marland KitchenOPD (chronic obstructive pulmonary disease) ([facility]   pt denies at preop  . Diabetes mellitus type II    diet controlled  . History of syncope   . Hyperlipidemia   . Hypertensive heart disease   . Kidney lump 04/04/2010   Overview:  Renal Cell Carcinoma   . Moderate aortic insufficiency    a. 12/2015 Echo: Mod AI.  . Marland Kitchenveractive bladder   . Paroxysmal atrial fibrillation ([facility]   a. 12/2015 started on Xarelto (CHA2DS2VASc = 4-5).  . Pneumonia   . Prostate cancer ([facility] [place]  . Renal cell carcinoma ([facility]  . Renal lesion   . Sleep apnea    Does not like  CPAP  . Spinal stenosis in cervical region 10/12/2016    SURGICAL HISTORY: Past Surgical History:  Procedure Laterality Date  . BACK SURGERY    . CARDIAC CATHETERIZATION N/A 12/18/2015   Procedure: Left Heart Cath and Coronary Angiography;  Surgeon: DaLeonie ManMD;  [facility];  Service: Cardiovascular;  Laterality: N/A;  . DIAGNOSTIC LAPAROSCOPY     partial colectomy  . LAPAROSCOPIC PARTIAL COLECTOMY Right 05/21/2018   Procedure: LAPAROSCOPIC RIGHT  COLECTOMY ERAS PATHWAY;  Surgeon: ThLeighton RuffMD;  Location: [facility];  Service:  General;  Laterality: Right;  . LUGeorge "replaced a disc"    SOCIAL HISTORY: Social History   Socioeconomic History  . Marital status: Widowed    Spouse name: Not on file  . Number of children: Not on file  . Years of education: Not on file  . Highest education level: Not on file  Occupational History  . Not on file  Social Needs  . Financial resource strain: Not on file  . Food insecurity:    Worry: Not on file    Inability: Not on file  . Transportation needs:    Medical: Not on file    Non-medical: Not on file  Tobacco Use  . Smoking status:  Former Smoker    Types: Cigars    Last attempt to quit: 09/02/1977    Years since quitting: 40.8  . Smokeless tobacco: Never Used  . Tobacco comment:    Substance and Sexual Activity  . Alcohol use: Not Currently    Comment: "stopped drinking alcohol in ~ 1980; just drank a little on the weekends when I did drink"  . Drug use: No  . Sexual activity: Not Currently  Lifestyle  . Physical activity:    Days per week: Not on file    Minutes per session: Not on file  . Stress: Not on file  Relationships  . Social connections:    Talks on phone: Not on file    Gets together: Not on file    Attends religious service: Not on file    Active member of club or organization: Not on file    Attends meetings of clubs or organizations: Not on file    Relationship status: Not on file  . Intimate partner violence:    Fear of current or ex partner: Not on file    Emotionally abused: Not on file    Physically abused: Not on file    Forced sexual activity: Not on file  Other Topics Concern  . Not on file  Social History Narrative   Lives alone.  Wife died in 2023-04-01.  Lives in apartment.      FAMILY HISTORY: Family History  Problem Relation Age of Onset  . Emphysema Father   . Asthma Father   . Liver disease Father        tumor  . Heart disease Mother   . Hypertension Mother   . Asthma Sister   . Hypertension Son      ALLERGIES:  is allergic to amoxicillin and penicillins.  MEDICATIONS:  Current Outpatient Medications  Medication Sig Dispense Refill  . albuterol (PROVENTIL HFA;VENTOLIN HFA) 108 (90 Base) MCG/ACT inhaler Inhale 2 puffs into the lungs every 6 (six) hours as needed for wheezing or shortness of breath. 1 Inhaler 3  . albuterol (PROVENTIL) (5 MG/ML) 0.5% nebulizer solution Take 0.5 mLs (2.5 mg total) by nebulization every 6 (six) hours as needed for wheezing or shortness of breath. 20 mL 12  . amLODipine (NORVASC) 10 MG tablet Take 10 mg by mouth 2 (two) times daily.     [name] atorvastatin (LIPITOR) 80 MG tablet Take 1 tablet (80 mg total) by mouth daily. (Patient taking differently: Take 40 mg by mouth 2 (two) times daily. ) 90 tablet 3  . finasteride (PROSCAR) 5 MG tablet Take 1 tablet (5 mg total) by mouth daily. 30 tablet 0  . hydrALAZINE (APRESOLINE) 50 MG tablet Take 1 tablet (50 mg total) by mouth every 8 (eight) hours. 90 tablet 0  . tamsulosin (FLOMAX) 0.4 MG CAPS capsule Take 1 capsule (0.4 mg total) by mouth daily. 30 capsule 0   No current facility-administered medications for this visit.     REVIEW OF SYSTEMS:    A 10+ POINT REVIEW OF SYSTEMS WAS OBTAINED including neurology, dermatology, psychiatry, cardiac, respiratory, lymph, extremities, GI, GU, Musculoskeletal, constitutional, breasts, reproductive, HEENT.  All pertinent positives are noted in the HPI.  All others are negative.   PHYSICAL EXAMINATION: ECOG PERFORMANCE STATUS: 2-3  . Vitals:   06/19/18 1253  BP: 120/72  Pulse: 73  Resp: 18  Temp: 97.7 F (36.5 C)  SpO2: 100%   Filed Weights   06/19/18 1253  Weight: 226 lb (102.5 kg)   .  Body mass index is 36.48 kg/m.   GENERAL:alert, in no acute distress and comfortable SKIN: no acute rashes, no significant lesions EYES: conjunctival pallor + and non-injected, sclera anicteric OROPHARYNX: MMM, no exudates, no oropharyngeal erythema or ulceration NECK:  supple, no JVD LYMPH:  no palpable lymphadenopathy in the cervical, axillary or inguinal regions LUNGS: clear to auscultation b/l with normal respiratory effort HEART: irregular  ABDOMEN:  normoactive bowel sounds , non tender, not distended. No palpable hepatosplenomegaly.  Extremity: 1+ pedal edema PSYCH: alert & oriented x 3 with fluent speech NEURO: no focal motor/sensory deficits   LABORATORY DATA:  I have reviewed the data as listed  . CBC Latest Ref Rng & Units 06/19/2018 06/13/2018 06/12/2018  WBC 4.0 - 10.5 K/uL 12.3(H) 6.1 6.4  Hemoglobin 13.0 - 17.0 g/dL 9.0(L) 8.5(L) 7.4(L)  Hematocrit 39.0 - 52.0 % 26.6(L) 25.7(L) 23.7(L)  Platelets 150 - 400 K/uL 402(H) 392 418(H)    . CMP Latest Ref Rng & Units 06/19/2018 06/17/2018 06/15/2018  Glucose 70 - 99 mg/dL 135(H) 118(H) 133(H)  BUN 8 - 23 mg/dL 103(H) 110(H) 83(H)  Creatinine 0.61 - 1.24 mg/dL 5.87(HH) 5.57(H) 5.33(H)  Sodium 135 - 145 mmol/L 136 133(L) 137  Potassium 3.5 - 5.1 mmol/L 4.0 4.1 4.1  Chloride 98 - 111 mmol/L 99 99 99  CO2 22 - 32 mmol/L 24 21(L) 24  Calcium 8.9 - 10.3 mg/dL 9.5 9.2 9.2  Total Protein 6.5 - 8.1 g/dL 7.5 - -  Total Bilirubin 0.3 - 1.2 mg/dL 0.4 - -  Alkaline Phos 38 - 126 U/L 71 - -  AST 15 - 41 U/L 14(L) - -  ALT 0 - 44 U/L 13 - -   Component     Latest Ref Rng & Units 06/07/2018  Total Protein ELP     6.0 - 8.5 g/dL 6.4  Albumin ELP     2.9 - 4.4 g/dL 2.8 (L)  Alpha-1-Globulin     0.0 - 0.4 g/dL 0.4  Alpha-2-Globulin     0.4 - 1.0 g/dL 1.1 (H)  Beta Globulin     0.7 - 1.3 g/dL 0.8  Gamma Globulin     0.4 - 1.8 g/dL 1.3  M-SPIKE, %     Not Observed g/dL Not Observed  SPE Interp.      Comment  Comment      Comment  Globulin, Total     2.2 - 3.9 g/dL 3.6  A/G Ratio     0.7 - 1.7 0.8  Hepatitis B Surface Ag     Negative Negative  HCV Ab     0.0 - 0.9 s/co ratio <0.1  Hep A Ab, IgM     Negative Negative  Hep B Core Ab, IgM     Negative Negative  Kappa free light  chain     3.3 - 19.4 mg/L 7,790.4 (H)  Lamda free light chains     5.7 - 26.3 mg/L 34.3 (H)  Kappa, lamda light chain ratio     0.26 - 1.65 227.13 (H)  Myeloperoxidase Abs     0.0 - 9.0 U/mL <9.0  ANCA Proteinase 3     0.0 - 3.5 U/mL 7.2 (H)  ANA Ab, IFA      Negative  C3 Complement     82 - 167 mg/dL 144  Complement C4, Body Fluid     14 - 44 mg/dL 29    06/12/18 BM Bx:    05/21/18 Colon biopsy:  RADIOGRAPHIC STUDIES: I have personally reviewed the radiological images as listed and agreed with the findings in the report. Ct Abdomen Pelvis Wo Contrast  Result Date: 06/05/2018 CLINICAL DATA:  Abdominal pain, fever.  Recent partial colectomy. EXAM: CT ABDOMEN AND PELVIS WITHOUT CONTRAST TECHNIQUE: Multidetector CT imaging of the abdomen and pelvis was performed following the standard protocol without IV contrast. COMPARISON:  05/30/2018 FINDINGS: Lower chest: Cardiomegaly.  Bibasilar atelectasis.  No effusions. Hepatobiliary: Small scattered hepatic cysts, small scattered hepatic low-density lesions throughout the liver, likely cysts although these cannot be characterized fully without intravenous contrast. Gallbladder unremarkable. Pancreas: No focal abnormality or ductal dilatation. Spleen: No focal abnormality.  Normal size. Adrenals/Urinary Tract: Numerous low-density, mixed density and high-density masses throughout the kidney are stable since recent study and cannot be characterized fully without intravenous contrast. At least 2 of these masses were shown on prior contrast enhanced CT to be suspicious for renal cell carcinoma. No hydronephrosis. Adrenal glands unremarkable. Urinary bladder decompressed and mildly thick walled, likely related to prostate enlargement. Stomach/Bowel: Descending colonic and sigmoid diverticulosis. Postoperative changes in the right colon from right hemicolectomy, stable. Again seen is the ventral hernia containing a portion of the mid transverse colon  which appears unremarkable. No evidence of obstruction or bowel wall thickening. Stomach and small bowel grossly unremarkable. No evidence of bowel obstruction. Vascular/Lymphatic: Aortic atherosclerosis. No enlarged abdominal or pelvic lymph nodes. Reproductive: Marked enlargement of the prostate. Other: No free fluid or free air. Continued wall thickening within the periphery of the midline supraumbilical ventral hernia which also contains the transverse colon. Again, no underlying bowel wall thickening. This has a stable appearance since prior study. Musculoskeletal: No acute bony abnormality. Diffuse degenerative changes in the lumbar spine. IMPRESSION: Stable appearance of prior right hemicolectomy. Stable supraumbilical ventral hernia containing a portion of the transverse colon. The wall of the ventral hernia again appears thickened and indistinct, but underlying bowel does not appear abnormal. Scattered diverticulosis.  No active diverticulitis. Marked prostate enlargement with bladder wall thickening. Multiple renal lesions. At least 2 of which were shown on prior contrast-enhanced CT to be suspicious for renal cell carcinoma. No change. Scattered hepatic low-density lesions cannot be characterized without intravenous contrast, likely cysts. Cardiomegaly, bibasilar atelectasis. Aortic atherosclerosis. Electronically Signed   By: Rolm Baptise M.D.   On: 06/05/2018 08:22   Ct Abdomen Pelvis Wo Contrast  Result Date: 05/30/2018 CLINICAL DATA:  Partial right colectomy, perform laparoscopically. EXAM: CT ABDOMEN AND PELVIS WITHOUT CONTRAST TECHNIQUE: Multidetector CT imaging of the abdomen and pelvis was performed following the standard protocol without IV contrast. COMPARISON:  March 24, 2018 FINDINGS: Lower chest: Cardiomegaly.  No other abnormalities. Hepatobiliary: Multiple low-attenuation lesions in the liver are most likely cysts, unchanged. The liver and gallbladder are otherwise unremarkable.  Pancreas: Unremarkable. No pancreatic ductal dilatation or surrounding inflammatory changes. Spleen: Normal in size without focal abnormality. Adrenals/Urinary Tract: Adrenal glands are normal. A 4.1 cm apparently solid mass is seen in the upper pole of the left kidney measuring 3.9 cm previously. A 2.7 cm mass in the medial left kidney on series 2, image 28 is essentially unchanged and nonspecific. Several renal cysts are noted. There is a solid and cystic appearing mass in the lower pole of the right kidney difficult to compare to the previous study due to lack of contrast today. However, the maximum measurement is approximately 4.6 cm, unchanged. Several probable hyperdense cysts are noted. The bladder is decompressed with a Foley catheter. There is mild  fat stranding adjacent to the bladder which is a new finding. No renal stones or hydronephrosis. No ureteral stones. Stomach/Bowel: The stomach and small bowel are normal. The patient is status post partial right colectomy scattered colonic diverticuli are noted. There is a new ventral hernia, located superior to the previously identified umbilical hernia. This is best seen on series 2, image 35. There is fat stranding within the hernia which likely contains omentum. It also contains a portion of transverse colon which does not appear to be inflamed. The wall of the hernia is thickened and inflamed. Surgical changes are seen in the right lower abdomen at the site of right colectomy. There is some increased attenuation in the surrounding fat, likely postoperative. No collection to suggest abscess or leak. The appendix is been removed along with the right partial colectomy. Vascular/Lymphatic: Mild atherosclerotic changes in the aorta. No obvious adenopathy. Reproductive: Again noted is an enlarged prostate exerting mass effect on the posterior bladder. Other: No abdominal wall hernia or abnormality. No abdominopelvic ascites. Musculoskeletal: No acute or  significant osseous findings. IMPRESSION: 1. At least 2 renal masses concerning for renal cell carcinoma. The masses were better assessed with a previous contrast-enhanced study. Recommend an MRI when the patient is able. 2. There is a new ventral hernia. The periphery of the hernia/hernia wall is thickened and inflamed. The hernia contains transverse colon which does not appear to be inflamed or obstructed. There is also likely some herniated omentum with associated fat stranding. This hernia is new compared to March 24, 2018. No associated bowel obstruction. 3. Inflammatory changes in the right lower quadrant, surrounding the site of right partial colectomy. These findings are likely postsurgical as surgery was only just over 1 week ago. No evidence of leak or abscess. 4. Atherosclerotic changes in the abdominal aorta. Electronically Signed   By: Dorise Bullion III M.D   On: 05/30/2018 12:24   Dg Chest 2 View  Result Date: 06/05/2018 CLINICAL DATA:  Shortness of breath. EXAM: CHEST - 2 VIEW COMPARISON:  Chest CT 05/19/2018 FINDINGS: Chronic cardiomegaly and aortic tortuosity. No pulmonary edema, pleural effusion, focal airspace disease or pneumothorax. No acute osseous abnormalities. IMPRESSION: Cardiomegaly without congestive failure or acute chest finding. Electronically Signed   By: Keith Rake M.D.   On: 06/05/2018 03:21   US Renal  Result Date: 06/06/2018 CLINICAL DATA:  Acute kidney injury, history coronary artery disease, chronic diastolic CHF, COPD, type II diabetes mellitus, hypertension, hypertensive heart disease, prostate cancer, renal cell carcinoma EXAM: RENAL / URINARY TRACT ULTRASOUND COMPLETE COMPARISON:  CT abdomen and pelvis 06/05/2017 FINDINGS: Right Kidney: Length: 14.5 cm. Normal cortical thickness. Upper normal echogenicity. Cystic lesion at mid kidney, 2.7 x 2.2 x 3.0 cm, containing a few scattered low level internal echoes; this lesion is hyperdense on the prior CT exam. An  additional nodule at the inferior pole measures 3.9 x 2.0 x 2.7 cm and demonstrates multiple septations and low-level scattered internal echogenicity. This lesion is mildly complicated and demonstrated low-attenuation and high attenuation components on the prior CT. No hydronephrosis or shadowing calcification. Left Kidney: Length: 16.7 cm. Normal cortical thickness. Minimally increased cortical echogenicity. Small simple cyst at inferior pole, 3.9 x 2.0 x 2.7 cm. Additional complicated cyst at upper pole 2.5 x 2.1 x 2.9 cm with a scattered internal echoes. Additional mass at upper pole appear solid, 4.9 x 4.5 x 4.5 cm, highly suspicious for a renal neoplasm. No hydronephrosis or shadowing calcification. Bladder: Normal appearing bladder. Marked prostatic  enlargement, 9.7 x 4.5 x 6.8 cm IMPRESSION: Probable medical renal disease changes of the kidneys. BILATERAL simple renal cysts. Additional solid mass at upper pole LEFT kidney 4.9 cm greatest size most likely representing a solid renal neoplasm, demonstrated enhancement on a prior CT exam of 03/24/2018 and now demonstrates an interval increase in size. Additional complicated cystic lesions at the inferior pole of RIGHT kidney and upper pole LEFT kidney, indeterminate, cannot exclude renal neoplasms; consider MR characterization. Marked prostatic enlargement. Electronically Signed   By: Lavonia Dana M.D.   On: 06/06/2018 17:55   Dg Bone Survey Met  Result Date: 06/10/2018 CLINICAL DATA:  Multiple myeloma EXAM: METASTATIC BONE SURVEY COMPARISON:  None. FINDINGS: Metastatic bone survey was performed. 1. Cervical Spine: Degenerative disc disease C3 through C7. No lytic abnormality. 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 2. Both shoulders: Mild osteoarthritis of the acromioclavicular and glenohumeral joints bilaterally. No lytic abnormality or fracture. No joint dislocation. 3. Humeri: No lytic abnormality. Mild degenerative subcortical cystic  change of the right humeral head adjacent to the greater tuberosity. 4. Bilateral forearms: No lytic abnormality of the radius or ulna on either side. Mild degenerative cystic change of the left scaphoid. 5. Lateral skull: Negative aggressive osteolytic lesions. 6. Pain and lateral thoracic spine: Multilevel degenerative disc disease and endplate spurring. Patchy ill-defined appearance of the mid to lower thoracic vertebral bodies. Cannot exclude small lytic foci. Dedicated CT of the thoracic spine may help for better assessment. 7. Lumbar spine: Mild degenerative change with disc flattening and endplate spurring. No aggressive lytic abnormalities. 8. Chest: Cardiomegaly. Aortic atherosclerosis. Clear lungs. No definite lytic abnormality of the visualized ribs. 9. AP pelvis: No fracture nor aggressive lytic abnormality noted. 10. Femora: Negative for lytic abnormality. 11. Tibia and fibula: Negative bilaterally lytic abnormality. IMPRESSION: Slightly patchy ill-defined appearance of the thoracic spine some which is due to overlap of adjacent ribs and lung limiting assessment. The possibility of subtle lytic abnormalities is not excluded as a result. CT may help for better assessment. Otherwise, no aggressive nor suspicious lytic abnormalities are identified. Electronically Signed   By: Ashley Royalty M.D.   On: 06/10/2018 23:57   Ct Bone Marrow Biopsy & Aspiration  Result Date: 06/12/2018 INDICATION: 72 year old with anemia and evaluate for light chain myeloma. EXAM: CT GUIDED BONE MARROW ASPIRATES AND BIOPSY Physician: Stephan Minister. Anselm Pancoast, MD MEDICATIONS: No antibiotics ANESTHESIA/SEDATION: Fentanyl 25 mcg IV; Versed 0.5 mg IV Moderate Sedation Time: The patient was continuously monitored during the procedure by the interventional radiology nurse under my direct supervision. COMPLICATIONS: None immediate. PROCEDURE: The procedure was explained to the patient. The risks and benefits of the procedure were discussed and  the patient's questions were addressed. Informed consent was obtained from the patient. The patient was placed prone on CT table. Images of the pelvis were obtained. The back was prepped and draped in sterile fashion. Maximal barrier sterile technique was utilized including caps, mask, sterile gowns, sterile gloves, sterile drape, hand hygiene and skin antiseptic. The skin and right posterior ilium were anesthetized with 1% lidocaine. 11 gauge bone needle was directed into the right ilium with CT guidance. Two aspirates and one core biopsy were obtained. Bandage placed over the puncture site. FINDINGS: Bone needle was directed into the posterior right ilium. Enlarged prostate. IMPRESSION: CT guided bone marrow aspiration and core biopsy. Electronically Signed   By: Markus Daft M.D.   On: 06/12/2018 19:56    ASSESSMENT & PLAN:   72 y.o. male  with  1. Newly diagnosed Light chain myeloma Component     Latest Ref Rng & Units 06/07/2018  Hep B Core Ab, IgM     Negative Negative  Kappa free light chain     3.3 - 19.4 mg/L 7,790.4 (H)  Lamda free light chains     5.7 - 26.3 mg/L 34.3 (H)  Kappa, lamda light chain ratio     0.26 - 1.65 227.13 (H)   2. ARF on CKD -- multiple etiologies - myeloma, sepsis, BPH etc. 3.Imaging concern for Renal cell carcinoma 4. Anemia - likely primarily due to myeloma but also from sepsis, ckd  PLAN -transfuse prn to maintain hgb >8  -06/07/18 S[facility] meet criteria for light chain myeloma with Kappa elevated at 7790 and K:L ratio elevated at 227 -Discussed pt labwork, 06/17/18 RFP revealed Creatinine at 5.57. Calcium normal at 9.2. Last CBC from 06/13/18 revealed HGB at 8.5 (prior to transfusion) -Discussed the 06/10/18 Bone Survey which revealed Slightly patchy ill-defined appearance of the thoracic spine some which is due to overlap of adjacent ribs and lung limiting assessment. The possibility of subtle lytic abnormalities is not excluded as a result. CT may help for  better assessment. Otherwise, no aggressive nor suspicious lytic abnormalities are identified.  -Discussed the 06/06/18 US Renal which revealed Probable medical renal disease changes of the kidneys. BILATERAL simple renal cysts. Additional solid mass at upper pole LEFT kidney 4.9 cm greatest size most likely representing a solid renal neoplasm, demonstrated enhancement on a prior CT exam of 03/24/2018 and now demonstrates an interval increase in size. Additional complicated cystic lesions at the inferior pole of RIGHT kidney and upper pole LEFT kidney, indeterminate, cannot exclude renal neoplasms; consider MR characterization. Marked prostatic enlargement -Discussed the 06/12/18 BM Bx which revealed hypercellular bone marrow with trilineage hematopoiesis and 40% kappa restricted plasma cells -Advised that the pt continue follow up with urology next week for concerns of renal cancer -Discussed my recommendation to begin Velcade and Dexamethasone for his Multiple Myeloma treatment, with the intention of adding additional therapies. The pt is agreeable to this plan.  -Will order blood tests today -Will order PET/CT  -Will set the pt up for chemotherapy counseling -Advised that the pt bring family members whom he desires to be part of his care to our next appointment  -Will see the pt back in 1-2 weeks   Labs today Chemo-counseling for Velcade/Cytoxan/Dexamethasone in 3-4days Starting Velcade/Dexamethasone weekly ASAP in 4-5 days with weekly labs PET/CT in 5-7 days RTC with Dr Irene Limbo 1 weeks after 1st velcade dose for toxicity check   All of the patients questions were answered with apparent satisfaction. The patient knows to call the clinic with any problems, questions or concerns.  The total time spent in the appt was 30 minutes and more than 50% was on counseling and direct patient cares.    Sullivan Lone MD MS AAHIVMS [facility] Hematology/Oncology Physician [facility]  (Office):        [phone] (Work cell):  [phone] (Fax):           [phone]  06/19/2018 1:53 PM  I, Baldwin [place], am acting as a scribe for Dr. Irene Limbo  .I have reviewed the above documentation for accuracy and completeness, and I agree with the above. Brunetta Genera MD

## 2018-06-19 ENCOUNTER — Inpatient Hospital Stay: Payer: Medicare Other

## 2018-06-19 ENCOUNTER — Telehealth: Payer: Self-pay | Admitting: Hematology

## 2018-06-19 ENCOUNTER — Inpatient Hospital Stay: Payer: Medicare Other | Attending: Hematology | Admitting: Hematology

## 2018-06-19 VITALS — BP 120/72 | HR 73 | Temp 97.7°F | Resp 18 | Ht 66.0 in | Wt 226.0 lb

## 2018-06-19 DIAGNOSIS — D649 Anemia, unspecified: Secondary | ICD-10-CM | POA: Insufficient documentation

## 2018-06-19 DIAGNOSIS — I130 Hypertensive heart and chronic kidney disease with heart failure and stage 1 through stage 4 chronic kidney disease, or unspecified chronic kidney disease: Secondary | ICD-10-CM | POA: Diagnosis not present

## 2018-06-19 DIAGNOSIS — N138 Other obstructive and reflux uropathy: Secondary | ICD-10-CM | POA: Insufficient documentation

## 2018-06-19 DIAGNOSIS — I2510 Atherosclerotic heart disease of native coronary artery without angina pectoris: Secondary | ICD-10-CM | POA: Diagnosis not present

## 2018-06-19 DIAGNOSIS — N281 Cyst of kidney, acquired: Secondary | ICD-10-CM

## 2018-06-19 DIAGNOSIS — C189 Malignant neoplasm of colon, unspecified: Secondary | ICD-10-CM | POA: Diagnosis not present

## 2018-06-19 DIAGNOSIS — G4730 Sleep apnea, unspecified: Secondary | ICD-10-CM | POA: Diagnosis not present

## 2018-06-19 DIAGNOSIS — I5032 Chronic diastolic (congestive) heart failure: Secondary | ICD-10-CM | POA: Insufficient documentation

## 2018-06-19 DIAGNOSIS — F419 Anxiety disorder, unspecified: Secondary | ICD-10-CM

## 2018-06-19 DIAGNOSIS — M19012 Primary osteoarthritis, left shoulder: Secondary | ICD-10-CM | POA: Diagnosis not present

## 2018-06-19 DIAGNOSIS — Z7189 Other specified counseling: Secondary | ICD-10-CM

## 2018-06-19 DIAGNOSIS — E1122 Type 2 diabetes mellitus with diabetic chronic kidney disease: Secondary | ICD-10-CM | POA: Diagnosis not present

## 2018-06-19 DIAGNOSIS — I480 Paroxysmal atrial fibrillation: Secondary | ICD-10-CM | POA: Insufficient documentation

## 2018-06-19 DIAGNOSIS — C9000 Multiple myeloma not having achieved remission: Secondary | ICD-10-CM | POA: Insufficient documentation

## 2018-06-19 DIAGNOSIS — E785 Hyperlipidemia, unspecified: Secondary | ICD-10-CM | POA: Diagnosis not present

## 2018-06-19 DIAGNOSIS — J449 Chronic obstructive pulmonary disease, unspecified: Secondary | ICD-10-CM | POA: Diagnosis not present

## 2018-06-19 DIAGNOSIS — Z79899 Other long term (current) drug therapy: Secondary | ICD-10-CM | POA: Diagnosis not present

## 2018-06-19 DIAGNOSIS — N2889 Other specified disorders of kidney and ureter: Secondary | ICD-10-CM

## 2018-06-19 DIAGNOSIS — K5900 Constipation, unspecified: Secondary | ICD-10-CM | POA: Diagnosis not present

## 2018-06-19 DIAGNOSIS — N401 Enlarged prostate with lower urinary tract symptoms: Secondary | ICD-10-CM | POA: Diagnosis not present

## 2018-06-19 DIAGNOSIS — R945 Abnormal results of liver function studies: Secondary | ICD-10-CM | POA: Diagnosis not present

## 2018-06-19 LAB — LACTATE DEHYDROGENASE: LDH: 206 U/L — ABNORMAL HIGH (ref 98–192)

## 2018-06-19 LAB — CMP (CANCER CENTER ONLY)
ALT: 13 U/L (ref 0–44)
ANION GAP: 13 (ref 5–15)
AST: 14 U/L — ABNORMAL LOW (ref 15–41)
Albumin: 3.4 g/dL — ABNORMAL LOW (ref 3.5–5.0)
Alkaline Phosphatase: 71 U/L (ref 38–126)
BUN: 103 mg/dL — ABNORMAL HIGH (ref 8–23)
CHLORIDE: 99 mmol/L (ref 98–111)
CO2: 24 mmol/L (ref 22–32)
Calcium: 9.5 mg/dL (ref 8.9–10.3)
Creatinine: 5.87 mg/dL (ref 0.61–1.24)
GFR, EST AFRICAN AMERICAN: 10 mL/min — AB (ref >60)
GFR, Estimated: 8 mL/min — ABNORMAL LOW (ref >60)
Glucose, Bld: 135 mg/dL — ABNORMAL HIGH (ref 70–99)
Potassium: 4 mmol/L (ref 3.5–5.1)
SODIUM: 136 mmol/L (ref 135–145)
Total Bilirubin: 0.4 mg/dL (ref 0.3–1.2)
Total Protein: 7.5 g/dL (ref 6.5–8.1)

## 2018-06-19 LAB — CBC WITH DIFFERENTIAL/PLATELET
ABS IMMATURE GRANULOCYTES: 0.09 10*3/uL — AB (ref 0.00–0.07)
BASOS PCT: 0 %
Basophils Absolute: 0 10*3/uL (ref 0.0–0.1)
EOS PCT: 1 %
Eosinophils Absolute: 0.1 10*3/uL (ref 0.0–0.5)
HCT: 26.6 % — ABNORMAL LOW (ref 39.0–52.0)
Hemoglobin: 9 g/dL — ABNORMAL LOW (ref 13.0–17.0)
Immature Granulocytes: 1 %
Lymphocytes Relative: 11 %
Lymphs Abs: 1.4 10*3/uL (ref 0.7–4.0)
MCH: 32.3 pg (ref 26.0–34.0)
MCHC: 33.8 g/dL (ref 30.0–36.0)
MCV: 95.3 fL (ref 80.0–100.0)
Monocytes Absolute: 1 10*3/uL (ref 0.1–1.0)
Monocytes Relative: 9 %
NRBC: 0 % (ref 0.0–0.2)
Neutro Abs: 9.7 10*3/uL — ABNORMAL HIGH (ref 1.7–7.7)
Neutrophils Relative %: 78 %
PLATELETS: 402 10*3/uL — AB (ref 150–400)
RBC: 2.79 MIL/uL — AB (ref 4.22–5.81)
RDW: 13.2 % (ref 11.5–15.5)
WBC: 12.3 10*3/uL — AB (ref 4.0–10.5)

## 2018-06-19 NOTE — Telephone Encounter (Signed)
Scheduled appt per 10/18 los -  Unable to schedule treatment due to no treatment plan being in - will contact patient when all appts are able to be scheduled.

## 2018-06-22 ENCOUNTER — Telehealth: Payer: Self-pay | Admitting: Hematology

## 2018-06-22 DIAGNOSIS — Z7189 Other specified counseling: Secondary | ICD-10-CM | POA: Insufficient documentation

## 2018-06-22 LAB — KAPPA/LAMBDA LIGHT CHAINS
KAPPA, LAMDA LIGHT CHAIN RATIO: 281.52 — AB (ref 0.26–1.65)
Kappa free light chain: 4982.9 mg/L — ABNORMAL HIGH (ref 3.3–19.4)
LAMDA FREE LIGHT CHAINS: 17.7 mg/L (ref 5.7–26.3)

## 2018-06-22 MED ORDER — ONDANSETRON HCL 8 MG PO TABS: 8.0000 mg | ORAL_TABLET | Freq: Two times a day (BID) | ORAL | 1 refills | Status: DC | PRN

## 2018-06-22 MED ORDER — ACYCLOVIR 400 MG PO TABS: 400.0000 mg | ORAL_TABLET | Freq: Two times a day (BID) | ORAL | 3 refills | Status: DC

## 2018-06-22 MED ORDER — PROCHLORPERAZINE MALEATE 10 MG PO TABS: 10.0000 mg | ORAL_TABLET | Freq: Four times a day (QID) | ORAL | 1 refills | Status: DC | PRN

## 2018-06-22 MED ORDER — DEXAMETHASONE 4 MG PO TABS: ORAL_TABLET | ORAL | 3 refills | Status: DC

## 2018-06-22 NOTE — Telephone Encounter (Signed)
Appts set up per 10/18 los - need to schedule chemo edu - called pt and left message for pt to call back and schedule appt.

## 2018-06-22 NOTE — Progress Notes (Signed)
START ON PATHWAY REGIMEN - Multiple Myeloma and Other Plasma Cell Dyscrasias     A cycle is every 28 days:     Bortezomib      Cyclophosphamide      Dexamethasone   **Always confirm dose/schedule in your pharmacy ordering system**  Patient Characteristics: Newly Diagnosed, Transplant Ineligible or Refused, Unknown or Awaiting Test Results R-ISS Staging: Unknown Disease Classification: Newly Diagnosed Is Patient Eligible for Transplant<= Transplant Ineligible or Refused Risk Status: Unknown Intent of Therapy: Non-Curative / Palliative Intent, Discussed with Patient

## 2018-06-23 NOTE — Progress Notes (Deleted)
HPI The patient presents for evaluation of aortic root dilatation, aortic insufficiency and atrial fib.  Echo in Feb last year demonstrated severe concentric LVH.  There was moderate AI and the aorta root was 52 mm.   CT confirmed the root to be 52 mm and the ascending aorta to be 43.  He could not tolerate an MRI.   The EF was normal on echo. CTA demonstrated a 4.3 cm aortic root which we are following.   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   He had a laparoscopic right colectomy.  He was admitted earlier this month with syncope.  The etiology was unclear.  He had hand pain and paresthesias.  He was treated with Gabapentin.  I reviewed these records for this visit.     ***   with our [place] he was not taking his Xarelto because he thought his blood was too thin.   He has refused to take 10 mg of Amlodipine which was prescribed.   Since I last saw him he is done very well.  He exercises.  He says that he gets his walking and he feels like a teenager.  He does not sleep well but he is about to be treated for sleep apnea.  He says this makes him tired during the day.  He might be getting married!  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  He was referred for preoperative clearance. The patient denies any new symptoms such as chest discomfort, neck or arm discomfort. There has been no new shortness of breath, PND or orthopnea. There have been no reported palpitations, presyncope or syncope.    Allergies  Allergen Reactions  . Amoxicillin Other (See Comments)    Tolerates Unasyn. Can't move Has patient had a PCN reaction causing immediate rash, facial/tongue/throat swelling, SOB or lightheadedness with hypotension: No Has patient had a PCN reaction causing severe rash involving mucus membranes or skin necrosis: No Has patient had a PCN reaction that required hospitalization No Has  patient had a PCN reaction occurring within the last 10 years: No If all of the above answers are "NO", then may proceed with Cephalosporin use.   [name] Penicillins Other (See Comments)    Tolerates Unasyn. Can't move/dizziness Has patient had a PCN reaction causing immediate rash, facial/tongue/throat swelling, SOB or lightheadedness with hypotension: No Has patient had a PCN reaction causing severe rash involving mucus membranes or skin necrosis: No Has patient had a PCN reaction that required hospitalization No Has patient had a PCN reaction occurring within the last 10 years: No If all of the above answers are "NO", then may proceed with Cephalosporin use.  Other reaction(s): O    Current Outpatient Medications  Medication Sig Dispense Refill  . acyclovir (ZOVIRAX) 400 MG tablet Take 1 tablet (400 mg total) by mouth 2 (two) times daily. 60 tablet 3  . albuterol (PROVENTIL HFA;VENTOLIN HFA) 108 (90 Base) MCG/ACT inhaler Inhale 2 puffs into the lungs every 6 (six) hours as needed for wheezing or shortness of breath. 1 Inhaler 3  . albuterol (PROVENTIL) (5 MG/ML) 0.5% nebulizer solution Take 0.5 mLs (2.5 mg total) by nebulization every 6 (six) hours as needed for wheezing or shortness of breath. 20 mL 12  . amLODipine (NORVASC) 10 MG tablet Take 10 mg by mouth 2 (two) times daily.     [name] atorvastatin (LIPITOR)  80 MG tablet Take 1 tablet (80 mg total) by mouth daily. (Patient taking differently: Take 40 mg by mouth 2 (two) times daily. ) 90 tablet 3  . dexamethasone (DECADRON) 4 MG tablet Take 5 tablets (20 mg) on days 1, 8, 15, and 22 of chemo. Repeat every 28 days. 30 tablet 3  . finasteride (PROSCAR) 5 MG tablet Take 1 tablet (5 mg total) by mouth daily. 30 tablet 0  . hydrALAZINE (APRESOLINE) 50 MG tablet Take 1 tablet (50 mg total) by mouth every 8 (eight) hours. 90 tablet 0  . ondansetron (ZOFRAN) 8 MG tablet Take 1 tablet (8 mg total) by mouth 2 (two) times daily as needed for refractory  nausea / vomiting. Starting on days 4 and 11. 30 tablet 1  . prochlorperazine (COMPAZINE) 10 MG tablet Take 1 tablet (10 mg total) by mouth every 6 (six) hours as needed (Nausea or vomiting). 30 tablet 1  . tamsulosin (FLOMAX) 0.4 MG CAPS capsule Take 1 capsule (0.4 mg total) by mouth daily. 30 capsule 0   No current facility-administered medications for this visit.     Past Medical History:  Diagnosis Date  . Abnormal LFTs (liver function tests)   . Anxiety   . Aortic root dilatation ([place])    a. 12/2015 Echo: mod dil of sinus of valsalva and Ao root (47mm).  b. 53 mm Echo 2018  . BPH with urinary obstruction   . Bradycardia   . 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 CAD (coronary artery disease)    a. 12/2015 NSTEMI/Cath (in setting of PAF):  LM nl, LAD 30p,  LCX 39m, RCA  ok, AM 100%, RPDA1 40, RPDA 2 60 ->Med Rx.  . Cervical pain 12/09/2014  . Childhood asthma   . Chronic diastolic CHF (congestive heart failure) ([place])    a. 12/2015 Echo: EF 50-55%, mod AI, mod Ao root dil, mild MR, mod dil LA, mod RA.  [name] COPD (chronic obstructive pulmonary disease) ([place])    pt denies at preop  . Diabetes mellitus type II    diet controlled  . History of syncope   . Hyperlipidemia   . Hypertensive heart disease   . Kidney lump 04/04/2010   Overview:  Renal Cell Carcinoma   . Moderate aortic insufficiency    a. 12/2015 Echo: Mod AI.  [name] Overactive bladder   . Paroxysmal atrial fibrillation ([place])    a. 12/2015 started on Xarelto (CHA2DS2VASc = 4-5).  . Pneumonia   . Prostate cancer ([place])   . Renal cell carcinoma ([place])   . Renal lesion   . Sleep apnea    Does not like  CPAP  . Spinal stenosis in cervical region 10/12/2016    Past Surgical History:  Procedure Laterality Date  . BACK SURGERY    . CARDIAC CATHETERIZATION N/A 12/18/2015   Procedure: Left Heart Cath and Coronary Angiography;  Surgeon: Leonie Man,  MD;  Location: [facility];  Service: Cardiovascular;  Laterality: N/A;  . DIAGNOSTIC LAPAROSCOPY     partial colectomy  . LAPAROSCOPIC PARTIAL COLECTOMY Right 05/21/2018   Procedure: LAPAROSCOPIC RIGHT  COLECTOMY ERAS PATHWAY;  Surgeon: Leighton Ruff, MD;  Location: [facility];  Service: General;  Laterality: Right;  . [place]   "replaced a disc"     ROS: ***   PHYSICAL EXAM There were no vitals taken for this visit.  GENERAL:  Well appearing NECK:  No jugular venous distention, waveform within normal limits, carotid upstroke brisk and symmetric, no bruits, no thyromegaly LUNGS:  Clear to auscultation bilaterally CHEST:  Unremarkable HEART:  PMI not displaced or sustained,S1 and S2 within normal limits, no S3, no S4, no clicks, no rubs, *** murmurs ABD:  Flat, positive bowel sounds normal in frequency in pitch, no bruits, no rebound, no guarding, no midline pulsatile mass, no hepatomegaly, no splenomegaly EXT:  2 plus pulses throughout, no edema, no cyanosis no clubbing   ***GENERAL:  Well appearing HEENT:  Proptosis.   NECK:  No jugular venous distention, waveform within normal limits, carotid upstroke brisk and symmetric, no bruits, no thyromegaly LUNGS:  Clear to auscultation bilaterally CHEST:  Unremarkable HEART:  PMI not displaced or sustained,S1 and S2 within normal limits, no S3, no S4, no clicks, no rubs, 2 out of 6 systolic murmur radiating slightly at the aortic outflow tract, 2 out of 6 diastolic murmur heard best at the fourth intercostal space murmurs ABD:  Flat, positive bowel sounds normal in frequency in pitch, no bruits, no rebound, no guarding, no midline pulsatile mass, no hepatomegaly, no splenomegaly EXT:  2 plus pulses throughout, no edema, no cyanosis no clubbing    Lab Results  Component Value Date   CHOL 199 09/30/2017   TRIG 109 09/30/2017   HDL 56 09/30/2017   LDLCALC 121 (H) 09/30/2017    ASSESSMENT AND PLAN  Chronic  diastolic congestive heart failure:   *** He seems to be euvolemic.  He will continue on the meds as listed.  AI:    He had moderate AI in Feb 2018.  I will follow up with an ehco.   Paroxysmal atrial fibrillation:     Mr. Tino Ronan Homesley has a CHA2DS2 - VASc score of 3. with a risk of stroke of 3.2%.    He has taken himself off of his anticoagulation.  He said that he would restart this after surgery.  ***  We discussed the risk of stroke.  He says he will start this back after surgery.  I would ask the surgeons to start Xarelto and they feel comfortable stopping his aspirin.   Hypertensive heart disease:   His blood pressure is *** at target.  He is taking his Norvasc.  He will continue with meds as listed.  Dilated aortic root: He tried to have his MRI but couldn't stay in the machine.  He did have a CTA last Sept and this was stable at 4.3 cm.   *** He is going to get a follow-up aorta and I will plan to do this in late September  Coronary artery disease: He has not been having any chest pain. Catheterization in 2017 revealed total occlusion of the acute marginal with otherwise nonobstructive disease.  *** He has no symptoms.  No further testing.  Hyperlipidemia: LDL was ***121 this  year.  He stopped taking his Lipitor.  He agrees to restart his Lipitor.    Sleep Apnea:   ***  He had a repeat study which I reivewed.  This is being treated by Dr. Annamaria Boots.  He is about to get a CPAP.

## 2018-06-24 ENCOUNTER — Encounter (HOSPITAL_COMMUNITY): Payer: Self-pay | Admitting: Hematology

## 2018-06-24 ENCOUNTER — Ambulatory Visit: Payer: Medicare Other | Admitting: Cardiology

## 2018-06-24 LAB — MULTIPLE MYELOMA PANEL, SERUM
ALBUMIN SERPL ELPH-MCNC: 3.3 g/dL (ref 2.9–4.4)
ALBUMIN/GLOB SERPL: 1 (ref 0.7–1.7)
Alpha 1: 0.3 g/dL (ref 0.0–0.4)
Alpha2 Glob SerPl Elph-Mcnc: 1.1 g/dL — ABNORMAL HIGH (ref 0.4–1.0)
B-GLOBULIN SERPL ELPH-MCNC: 0.9 g/dL (ref 0.7–1.3)
GAMMA GLOB SERPL ELPH-MCNC: 1.3 g/dL (ref 0.4–1.8)
GLOBULIN, TOTAL: 3.5 g/dL (ref 2.2–3.9)
IgA: 136 mg/dL (ref 61–437)
IgG (Immunoglobin G), Serum: 1402 mg/dL (ref 700–1600)
IgM (Immunoglobulin M), Srm: 49 mg/dL (ref 15–143)
Total Protein ELP: 6.8 g/dL (ref 6.0–8.5)

## 2018-06-26 ENCOUNTER — Inpatient Hospital Stay: Payer: Medicare Other

## 2018-06-26 ENCOUNTER — Encounter (HOSPITAL_COMMUNITY): Admission: RE | Admit: 2018-06-26 | Payer: Medicare Other | Source: Ambulatory Visit

## 2018-06-26 LAB — CULTURE, BLOOD (ROUTINE X 2): Special Requests: ADEQUATE

## 2018-06-29 ENCOUNTER — Telehealth: Payer: Self-pay | Admitting: Hematology

## 2018-06-29 NOTE — Telephone Encounter (Signed)
Spoke with patients son and scheduled appt for chemo edu for 10/29 - pt son to call if unable to make it .

## 2018-06-30 ENCOUNTER — Telehealth: Payer: Self-pay | Admitting: Hematology

## 2018-06-30 ENCOUNTER — Other Ambulatory Visit: Payer: Medicare Other

## 2018-06-30 ENCOUNTER — Telehealth: Payer: Self-pay | Admitting: *Deleted

## 2018-06-30 NOTE — Telephone Encounter (Signed)
Called cell # listed under pt's demographics & reach son to discuss missed appt from today.  He reports that he has been out of town & thought his father had a ride but must not have worked out.  He would like to move appt to Thurs @ 2pm so that he can bring him.  Message sent to schedulers.

## 2018-06-30 NOTE — Telephone Encounter (Signed)
Pt sched per 10/29 sch message.

## 2018-07-02 ENCOUNTER — Inpatient Hospital Stay: Payer: Medicare Other

## 2018-07-03 ENCOUNTER — Inpatient Hospital Stay: Payer: Medicare Other

## 2018-07-03 ENCOUNTER — Inpatient Hospital Stay: Payer: Medicare Other | Attending: Hematology

## 2018-07-03 ENCOUNTER — Other Ambulatory Visit: Payer: Self-pay | Admitting: *Deleted

## 2018-07-03 VITALS — BP 137/78 | HR 67 | Temp 98.2°F | Resp 18 | Ht 66.0 in | Wt 221.0 lb

## 2018-07-03 DIAGNOSIS — R945 Abnormal results of liver function studies: Secondary | ICD-10-CM | POA: Insufficient documentation

## 2018-07-03 DIAGNOSIS — E785 Hyperlipidemia, unspecified: Secondary | ICD-10-CM | POA: Insufficient documentation

## 2018-07-03 DIAGNOSIS — N281 Cyst of kidney, acquired: Secondary | ICD-10-CM | POA: Insufficient documentation

## 2018-07-03 DIAGNOSIS — I480 Paroxysmal atrial fibrillation: Secondary | ICD-10-CM | POA: Insufficient documentation

## 2018-07-03 DIAGNOSIS — C61 Malignant neoplasm of prostate: Secondary | ICD-10-CM | POA: Diagnosis not present

## 2018-07-03 DIAGNOSIS — Z5111 Encounter for antineoplastic chemotherapy: Secondary | ICD-10-CM | POA: Diagnosis not present

## 2018-07-03 DIAGNOSIS — Z85528 Personal history of other malignant neoplasm of kidney: Secondary | ICD-10-CM | POA: Insufficient documentation

## 2018-07-03 DIAGNOSIS — E119 Type 2 diabetes mellitus without complications: Secondary | ICD-10-CM | POA: Diagnosis not present

## 2018-07-03 DIAGNOSIS — C9000 Multiple myeloma not having achieved remission: Secondary | ICD-10-CM | POA: Diagnosis not present

## 2018-07-03 DIAGNOSIS — N138 Other obstructive and reflux uropathy: Secondary | ICD-10-CM | POA: Insufficient documentation

## 2018-07-03 DIAGNOSIS — I5032 Chronic diastolic (congestive) heart failure: Secondary | ICD-10-CM | POA: Diagnosis not present

## 2018-07-03 DIAGNOSIS — J449 Chronic obstructive pulmonary disease, unspecified: Secondary | ICD-10-CM | POA: Diagnosis not present

## 2018-07-03 DIAGNOSIS — F419 Anxiety disorder, unspecified: Secondary | ICD-10-CM | POA: Diagnosis not present

## 2018-07-03 DIAGNOSIS — N2889 Other specified disorders of kidney and ureter: Secondary | ICD-10-CM

## 2018-07-03 DIAGNOSIS — Z79899 Other long term (current) drug therapy: Secondary | ICD-10-CM | POA: Diagnosis not present

## 2018-07-03 DIAGNOSIS — I130 Hypertensive heart and chronic kidney disease with heart failure and stage 1 through stage 4 chronic kidney disease, or unspecified chronic kidney disease: Secondary | ICD-10-CM | POA: Diagnosis not present

## 2018-07-03 DIAGNOSIS — K5900 Constipation, unspecified: Secondary | ICD-10-CM | POA: Insufficient documentation

## 2018-07-03 DIAGNOSIS — I2510 Atherosclerotic heart disease of native coronary artery without angina pectoris: Secondary | ICD-10-CM | POA: Diagnosis not present

## 2018-07-03 DIAGNOSIS — Z87891 Personal history of nicotine dependence: Secondary | ICD-10-CM | POA: Insufficient documentation

## 2018-07-03 DIAGNOSIS — R197 Diarrhea, unspecified: Secondary | ICD-10-CM | POA: Diagnosis not present

## 2018-07-03 DIAGNOSIS — N189 Chronic kidney disease, unspecified: Secondary | ICD-10-CM | POA: Insufficient documentation

## 2018-07-03 DIAGNOSIS — C189 Malignant neoplasm of colon, unspecified: Secondary | ICD-10-CM

## 2018-07-03 DIAGNOSIS — Z7189 Other specified counseling: Secondary | ICD-10-CM

## 2018-07-03 DIAGNOSIS — R509 Fever, unspecified: Secondary | ICD-10-CM | POA: Diagnosis not present

## 2018-07-03 LAB — CMP (CANCER CENTER ONLY)
ALBUMIN: 3.3 g/dL — AB (ref 3.5–5.0)
ALK PHOS: 84 U/L (ref 38–126)
ALT: 11 U/L (ref 0–44)
AST: 14 U/L — AB (ref 15–41)
Anion gap: 12 (ref 5–15)
BILIRUBIN TOTAL: 0.4 mg/dL (ref 0.3–1.2)
BUN: 69 mg/dL — AB (ref 8–23)
CO2: 22 mmol/L (ref 22–32)
Calcium: 8.9 mg/dL (ref 8.9–10.3)
Chloride: 104 mmol/L (ref 98–111)
Creatinine: 3.94 mg/dL (ref 0.61–1.24)
GFR, Est AFR Am: 16 mL/min — ABNORMAL LOW (ref >60)
GFR, Estimated: 14 mL/min — ABNORMAL LOW (ref >60)
GLUCOSE: 145 mg/dL — AB (ref 70–99)
POTASSIUM: 3.7 mmol/L (ref 3.5–5.1)
SODIUM: 138 mmol/L (ref 135–145)
TOTAL PROTEIN: 7.2 g/dL (ref 6.5–8.1)

## 2018-07-03 LAB — CBC WITH DIFFERENTIAL (CANCER CENTER ONLY)
ABS IMMATURE GRANULOCYTES: 0.03 10*3/uL (ref 0.00–0.07)
BASOS ABS: 0 10*3/uL (ref 0.0–0.1)
Basophils Relative: 0 %
EOS ABS: 0.2 10*3/uL (ref 0.0–0.5)
Eosinophils Relative: 3 %
HEMATOCRIT: 23.7 % — AB (ref 39.0–52.0)
HEMOGLOBIN: 7.9 g/dL — AB (ref 13.0–17.0)
IMMATURE GRANULOCYTES: 0 %
LYMPHS PCT: 9 %
Lymphs Abs: 0.7 10*3/uL (ref 0.7–4.0)
MCH: 31.6 pg (ref 26.0–34.0)
MCHC: 33.3 g/dL (ref 30.0–36.0)
MCV: 94.8 fL (ref 80.0–100.0)
MONOS PCT: 3 %
Monocytes Absolute: 0.2 10*3/uL (ref 0.1–1.0)
NEUTROS PCT: 85 %
Neutro Abs: 5.9 10*3/uL (ref 1.7–7.7)
Platelet Count: 233 10*3/uL (ref 150–400)
RBC: 2.5 MIL/uL — ABNORMAL LOW (ref 4.22–5.81)
RDW: 12.8 % (ref 11.5–15.5)
WBC Count: 7 10*3/uL (ref 4.0–10.5)
nRBC: 0 % (ref 0.0–0.2)

## 2018-07-03 LAB — CEA (IN HOUSE-CHCC): CEA (CHCC-In House): 1.97 ng/mL (ref 0.00–5.00)

## 2018-07-03 MED ORDER — SODIUM CHLORIDE 0.9 % IV SOLN
Freq: Once | INTRAVENOUS | Status: AC
  Administered 2018-07-03: 14:00:00 via INTRAVENOUS
  Filled 2018-07-03: qty 250

## 2018-07-03 MED ORDER — PALONOSETRON HCL INJECTION 0.25 MG/5ML
INTRAVENOUS | Status: AC
  Filled 2018-07-03: qty 5

## 2018-07-03 MED ORDER — ACETAMINOPHEN 325 MG PO TABS
ORAL_TABLET | ORAL | Status: AC
  Filled 2018-07-03: qty 2

## 2018-07-03 MED ORDER — PALONOSETRON HCL INJECTION 0.25 MG/5ML
0.2500 mg | Freq: Once | INTRAVENOUS | Status: AC
  Administered 2018-07-03: 0.25 mg via INTRAVENOUS

## 2018-07-03 MED ORDER — SODIUM CHLORIDE 0.9 % IV SOLN
300.0000 mg/m2 | Freq: Once | INTRAVENOUS | Status: AC
  Administered 2018-07-03: 660 mg via INTRAVENOUS
  Filled 2018-07-03: qty 33

## 2018-07-03 MED ORDER — ACETAMINOPHEN 325 MG PO TABS
650.0000 mg | ORAL_TABLET | Freq: Once | ORAL | Status: AC
  Administered 2018-07-03: 650 mg via ORAL

## 2018-07-03 MED ORDER — BORTEZOMIB CHEMO SQ INJECTION 3.5 MG (2.5MG/ML)
1.5000 mg/m2 | Freq: Once | INTRAMUSCULAR | Status: AC
  Administered 2018-07-03: 3.25 mg via SUBCUTANEOUS
  Filled 2018-07-03: qty 1.3

## 2018-07-03 NOTE — Patient Instructions (Signed)
[facility] Discharge Instructions for Patients Receiving Chemotherapy  Today you received the following chemotherapy agents Bortezomib (Velcade) & Cyclophosphamide (Cytoxan).  To help prevent nausea and vomiting after your treatment, we encourage you to take your nausea medication as prescribed.  If you develop nausea and vomiting that is not controlled by your nausea medication, call the clinic.   BELOW ARE SYMPTOMS THAT SHOULD BE REPORTED IMMEDIATELY:  *FEVER GREATER THAN 100.5 F  *CHILLS WITH OR WITHOUT FEVER  NAUSEA AND VOMITING THAT IS NOT CONTROLLED WITH YOUR NAUSEA MEDICATION  *UNUSUAL SHORTNESS OF BREATH  *UNUSUAL BRUISING OR BLEEDING  TENDERNESS IN MOUTH AND THROAT WITH OR WITHOUT PRESENCE OF ULCERS  *URINARY PROBLEMS  *BOWEL PROBLEMS  UNUSUAL RASH Items with * indicate a potential emergency and should be followed up as soon as possible.  Feel free to call the clinic should you have any questions or concerns. The clinic phone number is [phone].  Please show the [facility] at check-in to the Emergency Department and triage nurse.  Bortezomib injection What is this medicine? BORTEZOMIB (bor TEZ oh mib) is a medicine that targets proteins in cancer cells and stops the cancer cells from growing. It is used to treat multiple myeloma and mantle-cell lymphoma. This medicine may be used for other purposes; ask your health care provider or pharmacist if you have questions. COMMON BRAND NAME(S): Velcade What should I tell my health care provider before I take this medicine? They need to know if you have any of these conditions: -diabetes -heart disease -irregular heartbeat -liver disease -on hemodialysis -low blood counts, like low white blood cells, platelets, or hemoglobin -peripheral neuropathy -taking medicine for blood pressure -an unusual or allergic reaction to bortezomib, mannitol, boron, other medicines, foods, dyes, or  preservatives -pregnant or trying to get pregnant -breast-feeding How should I use this medicine? This medicine is for injection into a vein or for injection under the skin. It is given by a health care professional in a hospital or clinic setting. Talk to your pediatrician regarding the use of this medicine in children. Special care may be needed. Overdosage: If you think you have taken too much of this medicine contact a poison control center or emergency room at once. NOTE: This medicine is only for you. Do not share this medicine with others. What if I miss a dose? It is important not to miss your dose. Call your doctor or health care professional if you are unable to keep an appointment. What may interact with this medicine? This medicine may interact with the following medications: -ketoconazole -rifampin -ritonavir -St. John's Wort This list may not describe all possible interactions. Give your health care provider a list of all the medicines, herbs, non-prescription drugs, or dietary supplements you use. Also tell them if you smoke, drink alcohol, or use illegal drugs. Some items may interact with your medicine. What should I watch for while using this medicine? You may get drowsy or dizzy. Do not drive, use machinery, or do anything that needs mental alertness until you know how this medicine affects you. Do not stand or sit up quickly, especially if you are an older patient. This reduces the risk of dizzy or fainting spells. In some cases, you may be given additional medicines to help with side effects. Follow all directions for their use. Call your doctor or health care professional for advice if you get a fever, chills or sore throat, or other symptoms of a cold or flu.  Do not treat yourself. This drug decreases your body's ability to fight infections. Try to avoid being around people who are sick. This medicine may increase your risk to bruise or bleed. Call your doctor or health  care professional if you notice any unusual bleeding. You may need blood work done while you are taking this medicine. In some patients, this medicine may cause a serious brain infection that may cause death. If you have any problems seeing, thinking, speaking, walking, or standing, tell your doctor right away. If you cannot reach your doctor, urgently seek other source of medical care. Check with your doctor or health care professional if you get an attack of severe diarrhea, nausea and vomiting, or if you sweat a lot. The loss of too much body fluid can make it dangerous for you to take this medicine. Do not become pregnant while taking this medicine or for at least 2 months after stopping it. Women should inform their doctor if they wish to become pregnant or think they might be pregnant. Men should not father a child while taking this medicine and for at least 2 months after stopping it. There is a potential for serious side effects to an unborn child. Talk to your health care professional or pharmacist for more information. Do not breast-feed an infant while taking this medicine or for 2 months after stopping it. This medicine may interfere with the ability to have a child. You should talk with your doctor or health care professional if you are concerned about your fertility. What side effects may I notice from receiving this medicine? Side effects that you should report to your doctor or health care professional as soon as possible: -allergic reactions like skin rash, itching or hives, swelling of the face, lips, or tongue -breathing problems -changes in hearing -changes in vision -fast, irregular heartbeat -feeling faint or lightheaded, falls -pain, tingling, numbness in the hands or feet -right upper belly pain -seizures -swelling of the ankles, feet, hands -unusual bleeding or bruising -unusually weak or tired -vomiting -yellowing of the eyes or skin Side effects that usually do not  require medical attention (report to your doctor or health care professional if they continue or are bothersome): -changes in emotions or moods -constipation -diarrhea -loss of appetite -headache -irritation at site where injected -nausea This list may not describe all possible side effects. Call your doctor for medical advice about side effects. You may report side effects to FDA at 1-800-FDA-1088. Where should I keep my medicine? This drug is given in a hospital or clinic and will not be stored at home. NOTE: This sheet is a summary. It may not cover all possible information. If you have questions about this medicine, talk to your doctor, pharmacist, or health care provider.  2018 Elsevier/Gold Standard (2016-07-18 15:53:51)  Cyclophosphamide injection What is this medicine? CYCLOPHOSPHAMIDE (sye kloe FOSS fa mide) is a chemotherapy drug. It slows the growth of cancer cells. This medicine is used to treat many types of cancer like lymphoma, myeloma, leukemia, breast cancer, and ovarian cancer, to name a few. This medicine may be used for other purposes; ask your health care provider or pharmacist if you have questions. COMMON BRAND NAME(S): Cytoxan, Neosar What should I tell my health care provider before I take this medicine? They need to know if you have any of these conditions: -blood disorders -history of other chemotherapy -infection -kidney disease -liver disease -recent or ongoing radiation therapy -tumors in the bone marrow -an unusual or  allergic reaction to cyclophosphamide, other chemotherapy, other medicines, foods, dyes, or preservatives -pregnant or trying to get pregnant -breast-feeding How should I use this medicine? This drug is usually given as an injection into a vein or muscle or by infusion into a vein. It is administered in a hospital or clinic by a specially trained health care professional. Talk to your pediatrician regarding the use of this medicine in  children. Special care may be needed. Overdosage: If you think you have taken too much of this medicine contact a poison control center or emergency room at once. NOTE: This medicine is only for you. Do not share this medicine with others. What if I miss a dose? It is important not to miss your dose. Call your doctor or health care professional if you are unable to keep an appointment. What may interact with this medicine? This medicine may interact with the following medications: -amiodarone -amphotericin B -azathioprine -certain antiviral medicines for HIV or AIDS such as protease inhibitors (e.g., indinavir, ritonavir) and zidovudine -certain blood pressure medications such as benazepril, captopril, enalapril, fosinopril, lisinopril, moexipril, monopril, perindopril, quinapril, ramipril, trandolapril -certain cancer medications such as anthracyclines (e.g., daunorubicin, doxorubicin), busulfan, cytarabine, paclitaxel, pentostatin, tamoxifen, trastuzumab -certain diuretics such as chlorothiazide, chlorthalidone, hydrochlorothiazide, indapamide, metolazone -certain medicines that treat or prevent blood clots like warfarin -certain muscle relaxants such as succinylcholine -cyclosporine -etanercept -indomethacin -medicines to increase blood counts like filgrastim, pegfilgrastim, sargramostim -medicines used as general anesthesia -metronidazole -natalizumab This list may not describe all possible interactions. Give your health care provider a list of all the medicines, herbs, non-prescription drugs, or dietary supplements you use. Also tell them if you smoke, drink alcohol, or use illegal drugs. Some items may interact with your medicine. What should I watch for while using this medicine? Visit your doctor for checks on your progress. This drug may make you feel generally unwell. This is not uncommon, as chemotherapy can affect healthy cells as well as cancer cells. Report any side effects.  Continue your course of treatment even though you feel ill unless your doctor tells you to stop. Drink water or other fluids as directed. Urinate often, even at night. In some cases, you may be given additional medicines to help with side effects. Follow all directions for their use. Call your doctor or health care professional for advice if you get a fever, chills or sore throat, or other symptoms of a cold or flu. Do not treat yourself. This drug decreases your body's ability to fight infections. Try to avoid being around people who are sick. This medicine may increase your risk to bruise or bleed. Call your doctor or health care professional if you notice any unusual bleeding. Be careful brushing and flossing your teeth or using a toothpick because you may get an infection or bleed more easily. If you have any dental work done, tell your dentist you are receiving this medicine. You may get drowsy or dizzy. Do not drive, use machinery, or do anything that needs mental alertness until you know how this medicine affects you. Do not become pregnant while taking this medicine or for 1 year after stopping it. Women should inform their doctor if they wish to become pregnant or think they might be pregnant. Men should not father a child while taking this medicine and for 4 months after stopping it. There is a potential for serious side effects to an unborn child. Talk to your health care professional or pharmacist for more information. Do not breast-feed  an infant while taking this medicine. This medicine may interfere with the ability to have a child. This medicine has caused ovarian failure in some women. This medicine has caused reduced sperm counts in some men. You should talk with your doctor or health care professional if you are concerned about your fertility. If you are going to have surgery, tell your doctor or health care professional that you have taken this medicine. What side effects may I notice  from receiving this medicine? Side effects that you should report to your doctor or health care professional as soon as possible: -allergic reactions like skin rash, itching or hives, swelling of the face, lips, or tongue -low blood counts - this medicine may decrease the number of white blood cells, red blood cells and platelets. You may be at increased risk for infections and bleeding. -signs of infection - fever or chills, cough, sore throat, pain or difficulty passing urine -signs of decreased platelets or bleeding - bruising, pinpoint red spots on the skin, black, tarry stools, blood in the urine -signs of decreased red blood cells - unusually weak or tired, fainting spells, lightheadedness -breathing problems -dark urine -dizziness -palpitations -swelling of the ankles, feet, hands -trouble passing urine or change in the amount of urine -weight gain -yellowing of the eyes or skin Side effects that usually do not require medical attention (report to your doctor or health care professional if they continue or are bothersome): -changes in nail or skin color -hair loss -missed menstrual periods -mouth sores -nausea, vomiting This list may not describe all possible side effects. Call your doctor for medical advice about side effects. You may report side effects to FDA at 1-800-FDA-1088. Where should I keep my medicine? This drug is given in a hospital or clinic and will not be stored at home. NOTE: This sheet is a summary. It may not cover all possible information. If you have questions about this medicine, talk to your doctor, pharmacist, or health care provider.  2018 Elsevier/Gold Standard (2012-07-03 16:22:58)

## 2018-07-03 NOTE — Progress Notes (Signed)
Per Dr. Alen Blew: Madaline Brilliant to treat with Hgb 7.9 and Cr 3.94

## 2018-07-06 ENCOUNTER — Telehealth: Payer: Self-pay | Admitting: *Deleted

## 2018-07-06 ENCOUNTER — Encounter: Payer: Self-pay | Admitting: Cardiology

## 2018-07-06 NOTE — Telephone Encounter (Signed)
Contacted patient at [phone] (H). Left VM stating that purpose of call was to inquire how he was feeling after chemotherapy on Friday. Asked that he contact Dr. Grier Mitts office for any questions or concerns. Also attempted to contact patient at [phone] (M). Son answered and stated that was his primary number as secondary contact. Stated purpose of contacting father and son verbalized understanding.

## 2018-07-06 NOTE — Telephone Encounter (Signed)
-----   Message from Georgianne Fick, RN sent at 07/03/2018  5:31 PM EDT ----- Regarding: Dr. Irene Limbo First Time Velcade & Cytoxan Patient received first time Velcade and Cytoxan and tolerated these well.

## 2018-07-06 NOTE — Progress Notes (Deleted)
HPI The patient presents for evaluation of aortic root dilatation, aortic insufficiency and atrial fib.  Echo in Feb last year demonstrated severe concentric LVH.  There was moderate AI and the aorta root was 52 mm.   CT confirmed the root to be 52 mm and the ascending aorta to be 43.  He could not tolerate an MRI.   The EF was normal on echo. CTA demonstrated a 4.3 cm aortic root which we are following.   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   He had a laparoscopic right colectomy.  He was admitted earlier this month with syncope.  The etiology was unclear.  He had hand pain and paresthesias.  He was treated with Gabapentin.  I reviewed these records for this visit.     ***   with our [facility] he was not taking his Xarelto because he thought his blood was too thin.   He has refused to take 10 mg of Amlodipine which was prescribed.   Since I last saw him he is done very well.  He exercises.  He says that he gets his walking and he feels like a teenager.  He does not sleep well but he is about to be treated for sleep apnea.  He says this makes him tired during the day.  He might be getting married!  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  He was referred for preoperative clearance. The patient denies any new symptoms such as chest discomfort, neck or arm discomfort. There has been no new shortness of breath, PND or orthopnea. There have been no reported palpitations, presyncope or syncope.    Allergies  Allergen Reactions  . Amoxicillin Other (See Comments)    Tolerates Unasyn. Can't move Has patient had a PCN reaction causing immediate rash, facial/tongue/throat swelling, SOB or lightheadedness with hypotension: No Has patient had a PCN reaction causing severe rash involving mucus membranes or skin necrosis: No Has patient had a PCN reaction that required hospitalization No Has  patient had a PCN reaction occurring within the last 10 years: No If all of the above answers are "NO", then may proceed with Cephalosporin use.   [name] Penicillins Other (See Comments)    Tolerates Unasyn. Can't move/dizziness Has patient had a PCN reaction causing immediate rash, facial/tongue/throat swelling, SOB or lightheadedness with hypotension: No Has patient had a PCN reaction causing severe rash involving mucus membranes or skin necrosis: No Has patient had a PCN reaction that required hospitalization No Has patient had a PCN reaction occurring within the last 10 years: No If all of the above answers are "NO", then may proceed with Cephalosporin use.  Other reaction(s): O    Current Outpatient Medications  Medication Sig Dispense Refill  . acyclovir (ZOVIRAX) 400 MG tablet Take 1 tablet (400 mg total) by mouth 2 (two) times daily. 60 tablet 3  . albuterol (PROVENTIL HFA;VENTOLIN HFA) 108 (90 Base) MCG/ACT inhaler Inhale 2 puffs into the lungs every 6 (six) hours as needed for wheezing or shortness of breath. 1 Inhaler 3  . albuterol (PROVENTIL) (5 MG/ML) 0.5% nebulizer solution Take 0.5 mLs (2.5 mg total) by nebulization every 6 (six) hours as needed for wheezing or shortness of breath. 20 mL 12  . amLODipine (NORVASC) 10 MG tablet Take 10 mg by mouth 2 (two) times daily.     [name] atorvastatin (LIPITOR)  80 MG tablet Take 1 tablet (80 mg total) by mouth daily. (Patient taking differently: Take 40 mg by mouth 2 (two) times daily. ) 90 tablet 3  . dexamethasone (DECADRON) 4 MG tablet Take 5 tablets (20 mg) on days 1, 8, 15, and 22 of chemo. Repeat every 28 days. 30 tablet 3  . finasteride (PROSCAR) 5 MG tablet Take 1 tablet (5 mg total) by mouth daily. 30 tablet 0  . hydrALAZINE (APRESOLINE) 50 MG tablet Take 1 tablet (50 mg total) by mouth every 8 (eight) hours. 90 tablet 0  . ondansetron (ZOFRAN) 8 MG tablet Take 1 tablet (8 mg total) by mouth 2 (two) times daily as needed for refractory  nausea / vomiting. Starting on days 4 and 11. 30 tablet 1  . prochlorperazine (COMPAZINE) 10 MG tablet Take 1 tablet (10 mg total) by mouth every 6 (six) hours as needed (Nausea or vomiting). 30 tablet 1  . tamsulosin (FLOMAX) 0.4 MG CAPS capsule Take 1 capsule (0.4 mg total) by mouth daily. 30 capsule 0   No current facility-administered medications for this visit.     Past Medical History:  Diagnosis Date  . Abnormal LFTs (liver function tests)   . Anxiety   . Aortic root dilatation ([place])    a. 12/2015 Echo: mod dil of sinus of valsalva and Ao root (52mm).  b. 53 mm Echo 2018  . BPH with urinary obstruction   . Bradycardia   . 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 CAD (coronary artery disease)    a. 12/2015 NSTEMI/Cath (in setting of PAF):  LM nl, LAD 30p,  LCX 30m, RCA  ok, AM 100%, RPDA1 40, RPDA 2 60 ->Med Rx.  . Cervical pain 12/09/2014  . Childhood asthma   . Chronic diastolic CHF (congestive heart failure) ([place])    a. 12/2015 Echo: EF 50-55%, mod AI, mod Ao root dil, mild MR, mod dil LA, mod RA.  [name] COPD (chronic obstructive pulmonary disease) ([place])    pt denies at preop  . Diabetes mellitus type II    diet controlled  . History of syncope   . Hyperlipidemia   . Hypertensive heart disease   . Kidney lump 04/04/2010   Overview:  Renal Cell Carcinoma   . Moderate aortic insufficiency    a. 12/2015 Echo: Mod AI.  [name] Overactive bladder   . Paroxysmal atrial fibrillation ([place])    a. 12/2015 started on Xarelto (CHA2DS2VASc = 4-5).  . Pneumonia   . Prostate cancer ([place])   . Renal cell carcinoma ([place])   . Renal lesion   . Sleep apnea    Does not like  CPAP  . Spinal stenosis in cervical region 10/12/2016    Past Surgical History:  Procedure Laterality Date  . BACK SURGERY    . CARDIAC CATHETERIZATION N/A 12/18/2015   Procedure: Left Heart Cath and Coronary Angiography;  Surgeon: Leonie Man,  MD;  Location: [facility];  Service: Cardiovascular;  Laterality: N/A;  . DIAGNOSTIC LAPAROSCOPY     partial colectomy  . LAPAROSCOPIC PARTIAL COLECTOMY Right 05/21/2018   Procedure: LAPAROSCOPIC RIGHT  COLECTOMY ERAS PATHWAY;  Surgeon: Leighton Ruff, MD;  Location: [facility];  Service: General;  Laterality: Right;  . [place]   "replaced a disc"     ROS: ***   PHYSICAL EXAM There were no vitals taken for this visit.  GENERAL:  Well appearing NECK:  No jugular venous distention, waveform within normal limits, carotid upstroke brisk and symmetric, no bruits, no thyromegaly LUNGS:  Clear to auscultation bilaterally CHEST:  Unremarkable HEART:  PMI not displaced or sustained,S1 and S2 within normal limits, no S3, no S4, no clicks, no rubs, *** murmurs ABD:  Flat, positive bowel sounds normal in frequency in pitch, no bruits, no rebound, no guarding, no midline pulsatile mass, no hepatomegaly, no splenomegaly EXT:  2 plus pulses throughout, no edema, no cyanosis no clubbing   ***GENERAL:  Well appearing HEENT:  Proptosis.   NECK:  No jugular venous distention, waveform within normal limits, carotid upstroke brisk and symmetric, no bruits, no thyromegaly LUNGS:  Clear to auscultation bilaterally CHEST:  Unremarkable HEART:  PMI not displaced or sustained,S1 and S2 within normal limits, no S3, no S4, no clicks, no rubs, 2 out of 6 systolic murmur radiating slightly at the aortic outflow tract, 2 out of 6 diastolic murmur heard best at the fourth intercostal space murmurs ABD:  Flat, positive bowel sounds normal in frequency in pitch, no bruits, no rebound, no guarding, no midline pulsatile mass, no hepatomegaly, no splenomegaly EXT:  2 plus pulses throughout, no edema, no cyanosis no clubbing    Lab Results  Component Value Date   CHOL 199 09/30/2017   TRIG 109 09/30/2017   HDL 56 09/30/2017   LDLCALC 121 (H) 09/30/2017    ASSESSMENT AND PLAN  Chronic  diastolic congestive heart failure:   *** He seems to be euvolemic.  He will continue on the meds as listed.  AI:    He had moderate AI in Feb 2018.  I will follow up with an ehco.   Paroxysmal atrial fibrillation:     Mr. Quentavious Rittenhouse Kernes has a CHA2DS2 - VASc score of 3. with a risk of stroke of 3.2%.    He has taken himself off of his anticoagulation.  He said that he would restart this after surgery.  ***  We discussed the risk of stroke.  He says he will start this back after surgery.  I would ask the surgeons to start Xarelto and they feel comfortable stopping his aspirin.   Hypertensive heart disease:   His blood pressure is *** at target.  He is taking his Norvasc.  He will continue with meds as listed.  Dilated aortic root: He tried to have his MRI but couldn't stay in the machine.  He did have a CTA last Sept and this was stable at 4.3 cm.   *** He is going to get a follow-up aorta and I will plan to do this in late September  Coronary artery disease: He has not been having any chest pain. Catheterization in 2017 revealed total occlusion of the acute marginal with otherwise nonobstructive disease.  *** He has no symptoms.  No further testing.  Hyperlipidemia: LDL was ***121 this  year.  He stopped taking his Lipitor.  He agrees to restart his Lipitor.    Sleep Apnea:   ***  He had a repeat study which I reivewed.  This is being treated by Dr. Annamaria Boots.  He is about to get a CPAP.

## 2018-07-08 ENCOUNTER — Ambulatory Visit: Payer: Medicare Other | Admitting: Cardiology

## 2018-07-09 ENCOUNTER — Other Ambulatory Visit: Payer: Self-pay | Admitting: *Deleted

## 2018-07-09 DIAGNOSIS — C9000 Multiple myeloma not having achieved remission: Secondary | ICD-10-CM

## 2018-07-10 ENCOUNTER — Inpatient Hospital Stay: Payer: Medicare Other

## 2018-07-16 ENCOUNTER — Encounter: Payer: Self-pay | Admitting: Cardiology

## 2018-07-16 ENCOUNTER — Encounter: Payer: Self-pay | Admitting: Hematology

## 2018-07-16 NOTE — Progress Notes (Signed)
Called pt to introduce myself as his Arboriculturist and to discuss the [employer].  Left a msg requesting he return my call at his earliest convenience.  If he doesn't call back I'll plan to see him on 07/17/18.

## 2018-07-17 ENCOUNTER — Inpatient Hospital Stay: Payer: Medicare Other

## 2018-07-17 ENCOUNTER — Ambulatory Visit (HOSPITAL_BASED_OUTPATIENT_CLINIC_OR_DEPARTMENT_OTHER): Payer: Medicare Other | Admitting: Medical

## 2018-07-17 VITALS — BP 126/81 | HR 64 | Temp 97.7°F | Resp 19 | Wt 216.8 lb

## 2018-07-17 DIAGNOSIS — Z5111 Encounter for antineoplastic chemotherapy: Secondary | ICD-10-CM | POA: Diagnosis not present

## 2018-07-17 DIAGNOSIS — I130 Hypertensive heart and chronic kidney disease with heart failure and stage 1 through stage 4 chronic kidney disease, or unspecified chronic kidney disease: Secondary | ICD-10-CM | POA: Diagnosis not present

## 2018-07-17 DIAGNOSIS — C9000 Multiple myeloma not having achieved remission: Secondary | ICD-10-CM | POA: Diagnosis not present

## 2018-07-17 DIAGNOSIS — R197 Diarrhea, unspecified: Secondary | ICD-10-CM

## 2018-07-17 DIAGNOSIS — R509 Fever, unspecified: Secondary | ICD-10-CM

## 2018-07-17 DIAGNOSIS — N138 Other obstructive and reflux uropathy: Secondary | ICD-10-CM

## 2018-07-17 DIAGNOSIS — N281 Cyst of kidney, acquired: Secondary | ICD-10-CM

## 2018-07-17 DIAGNOSIS — I480 Paroxysmal atrial fibrillation: Secondary | ICD-10-CM

## 2018-07-17 DIAGNOSIS — R945 Abnormal results of liver function studies: Secondary | ICD-10-CM

## 2018-07-17 DIAGNOSIS — I2510 Atherosclerotic heart disease of native coronary artery without angina pectoris: Secondary | ICD-10-CM

## 2018-07-17 DIAGNOSIS — N189 Chronic kidney disease, unspecified: Secondary | ICD-10-CM

## 2018-07-17 DIAGNOSIS — Z85528 Personal history of other malignant neoplasm of kidney: Secondary | ICD-10-CM

## 2018-07-17 DIAGNOSIS — E785 Hyperlipidemia, unspecified: Secondary | ICD-10-CM

## 2018-07-17 DIAGNOSIS — F419 Anxiety disorder, unspecified: Secondary | ICD-10-CM

## 2018-07-17 DIAGNOSIS — C61 Malignant neoplasm of prostate: Secondary | ICD-10-CM

## 2018-07-17 DIAGNOSIS — Z87891 Personal history of nicotine dependence: Secondary | ICD-10-CM

## 2018-07-17 DIAGNOSIS — K5900 Constipation, unspecified: Secondary | ICD-10-CM

## 2018-07-17 DIAGNOSIS — Z7189 Other specified counseling: Secondary | ICD-10-CM

## 2018-07-17 DIAGNOSIS — J449 Chronic obstructive pulmonary disease, unspecified: Secondary | ICD-10-CM

## 2018-07-17 DIAGNOSIS — E119 Type 2 diabetes mellitus without complications: Secondary | ICD-10-CM

## 2018-07-17 DIAGNOSIS — Z79899 Other long term (current) drug therapy: Secondary | ICD-10-CM | POA: Diagnosis not present

## 2018-07-17 DIAGNOSIS — I5032 Chronic diastolic (congestive) heart failure: Secondary | ICD-10-CM

## 2018-07-17 LAB — CBC WITH DIFFERENTIAL (CANCER CENTER ONLY)
Abs Immature Granulocytes: 0.01 10*3/uL (ref 0.00–0.07)
BASOS ABS: 0 10*3/uL (ref 0.0–0.1)
Basophils Relative: 0 %
Eosinophils Absolute: 0.2 10*3/uL (ref 0.0–0.5)
Eosinophils Relative: 2 %
HCT: 24.5 % — ABNORMAL LOW (ref 39.0–52.0)
HEMOGLOBIN: 8.1 g/dL — AB (ref 13.0–17.0)
Immature Granulocytes: 0 %
LYMPHS ABS: 1.1 10*3/uL (ref 0.7–4.0)
Lymphocytes Relative: 15 %
MCH: 31 pg (ref 26.0–34.0)
MCHC: 33.1 g/dL (ref 30.0–36.0)
MCV: 93.9 fL (ref 80.0–100.0)
MONO ABS: 0.7 10*3/uL (ref 0.1–1.0)
MONOS PCT: 10 %
NEUTROS ABS: 5.2 10*3/uL (ref 1.7–7.7)
Neutrophils Relative %: 73 %
PLATELETS: 221 10*3/uL (ref 150–400)
RBC: 2.61 MIL/uL — AB (ref 4.22–5.81)
RDW: 13.5 % (ref 11.5–15.5)
WBC Count: 7.2 10*3/uL (ref 4.0–10.5)
nRBC: 0 % (ref 0.0–0.2)

## 2018-07-17 LAB — CMP (CANCER CENTER ONLY)
ALT: 17 U/L (ref 0–44)
AST: 20 U/L (ref 15–41)
Albumin: 3.3 g/dL — ABNORMAL LOW (ref 3.5–5.0)
Alkaline Phosphatase: 95 U/L (ref 38–126)
Anion gap: 9 (ref 5–15)
BILIRUBIN TOTAL: 0.4 mg/dL (ref 0.3–1.2)
BUN: 46 mg/dL — AB (ref 8–23)
CHLORIDE: 106 mmol/L (ref 98–111)
CO2: 26 mmol/L (ref 22–32)
Calcium: 8.9 mg/dL (ref 8.9–10.3)
Creatinine: 2.51 mg/dL — ABNORMAL HIGH (ref 0.61–1.24)
GFR, Est AFR Am: 27 mL/min — ABNORMAL LOW (ref >60)
GFR, Estimated: 24 mL/min — ABNORMAL LOW (ref >60)
GLUCOSE: 133 mg/dL — AB (ref 70–99)
POTASSIUM: 3.5 mmol/L (ref 3.5–5.1)
Sodium: 141 mmol/L (ref 135–145)
TOTAL PROTEIN: 6.7 g/dL (ref 6.5–8.1)

## 2018-07-17 MED ORDER — ACETAMINOPHEN 325 MG PO TABS
ORAL_TABLET | ORAL | Status: AC
  Filled 2018-07-17: qty 2

## 2018-07-17 MED ORDER — ACETAMINOPHEN 325 MG PO TABS
650.0000 mg | ORAL_TABLET | Freq: Once | ORAL | Status: AC
  Administered 2018-07-17: 650 mg via ORAL

## 2018-07-17 MED ORDER — PALONOSETRON HCL INJECTION 0.25 MG/5ML
INTRAVENOUS | Status: AC
  Filled 2018-07-17: qty 5

## 2018-07-17 MED ORDER — SODIUM CHLORIDE 0.9 % IV SOLN
Freq: Once | INTRAVENOUS | Status: AC
  Administered 2018-07-17: 14:00:00 via INTRAVENOUS
  Filled 2018-07-17: qty 250

## 2018-07-17 MED ORDER — PALONOSETRON HCL INJECTION 0.25 MG/5ML
0.2500 mg | Freq: Once | INTRAVENOUS | Status: AC
  Administered 2018-07-17: 0.25 mg via INTRAVENOUS

## 2018-07-17 MED ORDER — BORTEZOMIB CHEMO SQ INJECTION 3.5 MG (2.5MG/ML)
1.5000 mg/m2 | Freq: Once | INTRAMUSCULAR | Status: AC
  Administered 2018-07-17: 3.25 mg via SUBCUTANEOUS
  Filled 2018-07-17: qty 1.3

## 2018-07-17 MED ORDER — SODIUM CHLORIDE 0.9 % IV SOLN
300.0000 mg/m2 | Freq: Once | INTRAVENOUS | Status: AC
  Administered 2018-07-17: 660 mg via INTRAVENOUS
  Filled 2018-07-17: qty 33

## 2018-07-17 NOTE — Progress Notes (Signed)
  Ok to treat with 07/17/18 lab results per Sandi Mealy, PA.

## 2018-07-17 NOTE — Patient Instructions (Signed)
[facility] Discharge Instructions for Patients Receiving Chemotherapy  Today you received the following chemotherapy agents Cytoxan, Velcade  To help prevent nausea and vomiting after your treatment, we encourage you to take your nausea medication as directed   If you develop nausea and vomiting that is not controlled by your nausea medication, call the clinic.   BELOW ARE SYMPTOMS THAT SHOULD BE REPORTED IMMEDIATELY:  *FEVER GREATER THAN 100.5 F  *CHILLS WITH OR WITHOUT FEVER  NAUSEA AND VOMITING THAT IS NOT CONTROLLED WITH YOUR NAUSEA MEDICATION  *UNUSUAL SHORTNESS OF BREATH  *UNUSUAL BRUISING OR BLEEDING  TENDERNESS IN MOUTH AND THROAT WITH OR WITHOUT PRESENCE OF ULCERS  *URINARY PROBLEMS  *BOWEL PROBLEMS  UNUSUAL RASH Items with * indicate a potential emergency and should be followed up as soon as possible.  Feel free to call the clinic should you have any questions or concerns. The clinic phone number is [phone].  Please show the [place] at check-in to the Emergency Department and triage nurse.

## 2018-07-17 NOTE — Progress Notes (Signed)
Lucianne Lei saw patient in the infusion room today for toxicity check.  Pt missed his appointment last week due to cleaning his appointment. Ok to treat with elevated Scr today. No changes to chemotherapy doses.  Hardie Pulley, PharmD, BCPS, BCOP

## 2018-07-22 NOTE — Progress Notes (Signed)
[facility] Progress Note   [name] [id number] September 07, 1945 72 y.o.  [name] is managed by Dr. Sullivan Lone  Actively treated with chemotherapy/immunotherapy: yes  Current Therapy: Velcade and Cytoxan  Last Treated: 07/17/2018 (cycle 1, day 8)  Assessment: Plan:    Multiple myeloma not having achieved remission ([place])   Multiple myeloma: The patient presents to the clinic today for cycle 1, day 8 of Velcade and Cytoxan.  He is scheduled to see Dr. Irene Limbo in follow-up on 07/24/2018.  Please see After Visit Summary for patient specific instructions.  Future Appointments  Date Time Provider [place]  07/24/2018  9:30 AM [facility] LAB 2 [facility] None  07/24/2018 10:00 AM Brunetta Genera, MD [facility] None  07/24/2018 10:45 AM [facility] INFUSION [facility] None    No orders of the defined types were placed in this encounter.      Subjective:   Patient ID:  [name] [name] is a 72 y.o. (DOB 1946-08-12) male.  Chief Complaint: No chief complaint on file.   HPI [name] [name] is a 72 year old male with a history of multiple myeloma who is managed by Dr. Sullivan Lone.  He is seen in infusion today as he is receiving cycle 1, day 8 of Velcade and Cytoxan.  He is previously missed several appointments.  This provider was asked to see the patient while he was receiving chemotherapy today.  He reports that he is doing well with no acute issues of concern.  He denies fevers, chills, sweats, nausea, vomiting, constipation, diarrhea, or anorexia.  Medications: I have reviewed the patient's current medications.  Allergies:  Allergies  Allergen Reactions  . Amoxicillin Other (See Comments)    Tolerates Unasyn. Can't move Has patient had a PCN reaction causing immediate rash, facial/tongue/throat swelling, SOB or lightheadedness with hypotension: No Has patient had a PCN reaction causing severe rash involving mucus membranes or skin necrosis:  No Has patient had a PCN reaction that required hospitalization No Has patient had a PCN reaction occurring within the last 10 years: No If all of the above answers are "NO", then may proceed with Cephalosporin use.   [name] Penicillins Other (See Comments)    Tolerates Unasyn. Can't move/dizziness Has patient had a PCN reaction causing immediate rash, facial/tongue/throat swelling, SOB or lightheadedness with hypotension: No Has patient had a PCN reaction causing severe rash involving mucus membranes or skin necrosis: No Has patient had a PCN reaction that required hospitalization No Has patient had a PCN reaction occurring within the last 10 years: No If all of the above answers are "NO", then may proceed with Cephalosporin use.  Other reaction(s): O    Past Medical History:  Diagnosis Date  . Abnormal LFTs (liver function tests)   . Anxiety   . Aortic root dilatation ([place])    a. 12/2015 Echo: mod dil of sinus of valsalva and Ao root (37m).  b. 53 mm Echo 2018  . BPH with urinary obstruction   . Bradycardia   . 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 CAD (coronary artery disease)    a. 12/2015 NSTEMI/Cath (in setting of PAF):  LM nl, LAD 30p,  LCX 319mRCA  ok, AM 100%, RPDA1 40, RPDA 2 60 ->Med Rx.  . Childhood asthma   . Chronic diastolic CHF (congestive heart failure) ([facility]   a. 12/2015 Echo: EF 50-55%, mod AI, mod Ao  root dil, mild MR, mod dil LA, mod RA.  [name] COPD (chronic obstructive pulmonary disease) ([place])    pt denies at preop  . Diabetes mellitus type II    diet controlled  . History of syncope   . Hyperlipidemia   . Hypertensive heart disease   . Kidney lump 04/04/2010   Overview:  Renal Cell Carcinoma   . Moderate aortic insufficiency    a. 12/2015 Echo: Mod AI.  [name] Paroxysmal atrial fibrillation ([place])    a. 12/2015 started on Xarelto (CHA2DS2VASc = 4-5).  . Pneumonia   . Prostate cancer  ([place])   . Renal cell carcinoma ([place])   . Sleep apnea    Does not like  CPAP  . Spinal stenosis in cervical region 10/12/2016    Past Surgical History:  Procedure Laterality Date  . BACK SURGERY    . CARDIAC CATHETERIZATION N/A 12/18/2015   Procedure: Left Heart Cath and Coronary Angiography;  Surgeon: Leonie Man, MD;  Location: [facility];  Service: Cardiovascular;  Laterality: N/A;  . DIAGNOSTIC LAPAROSCOPY     partial colectomy  . LAPAROSCOPIC PARTIAL COLECTOMY Right 05/21/2018   Procedure: LAPAROSCOPIC RIGHT  COLECTOMY ERAS PATHWAY;  Surgeon: Leighton Ruff, MD;  Location: [facility];  Service: General;  Laterality: Right;  . [place]   "replaced a disc"    Family History  Problem Relation Age of Onset  . Emphysema Father   . Asthma Father   . Liver disease Father        tumor  . Heart disease Mother   . Hypertension Mother   . Asthma Sister   . Hypertension Son     Social History   Socioeconomic History  . Marital status: Widowed    Spouse name: Not on file  . Number of children: Not on file  . Years of education: Not on file  . Highest education level: Not on file  Occupational History  . Not on file  Social Needs  . Financial resource strain: Not on file  . Food insecurity:    Worry: Not on file    Inability: Not on file  . Transportation needs:    Medical: Not on file    Non-medical: Not on file  Tobacco Use  . Smoking status: Former Smoker    Types: Cigars    Last attempt to quit: 09/02/1977    Years since quitting: 40.9  . Smokeless tobacco: Never Used  . Tobacco comment:    Substance and Sexual Activity  . Alcohol use: Not Currently    Comment: "stopped drinking alcohol in ~ 1980; just drank a little on the weekends when I did drink"  . Drug use: No  . Sexual activity: Not Currently  Lifestyle  . Physical activity:    Days per week: Not on file    Minutes per session: Not on file  . Stress: Not on file  Relationships   . Social connections:    Talks on phone: Not on file    Gets together: Not on file    Attends religious service: Not on file    Active member of club or organization: Not on file    Attends meetings of clubs or organizations: Not on file    Relationship status: Not on file  . Intimate partner violence:    Fear of current or ex partner: Not on file    Emotionally abused: Not on file    Physically abused: Not  on file    Forced sexual activity: Not on file  Other Topics Concern  . Not on file  Social History Narrative   Lives alone.  Wife died in 04/16/2023.  Lives in apartment.      Past Medical History, Surgical history, Social history, and Family history were reviewed and updated as appropriate.   Please see review of systems for further details on the patient's review from today.   Review of Systems:  Review of Systems  Constitutional: Negative for chills, diaphoresis and fever.  HENT: Negative for trouble swallowing and voice change.   Respiratory: Negative for cough, chest tightness, shortness of breath and wheezing.   Cardiovascular: Negative for chest pain and palpitations.  Gastrointestinal: Negative for abdominal pain, constipation, diarrhea, nausea and vomiting.  Musculoskeletal: Negative for back pain and myalgias.  Neurological: Negative for dizziness, light-headedness and headaches.    Objective:   Physical Exam:  There were no vitals taken for this visit. ECOG: 0  Physical Exam  Constitutional: No distress.  HENT:  Head: Normocephalic and atraumatic.  Cardiovascular: Normal rate, regular rhythm and normal heart sounds. Exam reveals no gallop and no friction rub.  No murmur heard. Pulmonary/Chest: Effort normal and breath sounds normal. No respiratory distress. He has no wheezes. He has no rales.  Neurological: He is alert.  Skin: Skin is warm and dry. No rash noted. He is not diaphoretic. No erythema.    Lab Review:     Component Value Date/Time   NA 141  07/17/2018 1123   NA 139 01/07/2018 1402   K 3.5 07/17/2018 1123   CL 106 07/17/2018 1123   CO2 26 07/17/2018 1123   GLUCOSE 133 (H) 07/17/2018 1123   BUN 46 (H) 07/17/2018 1123   BUN 29 (H) 01/07/2018 1402   CREATININE 2.51 (H) 07/17/2018 1123   CREATININE 1.01 03/29/2016 1032   CALCIUM 8.9 07/17/2018 1123   PROT 6.7 07/17/2018 1123   ALBUMIN 3.3 (L) 07/17/2018 1123   AST 20 07/17/2018 1123   ALT 17 07/17/2018 1123   ALKPHOS 95 07/17/2018 1123   BILITOT 0.4 07/17/2018 1123   GFRNONAA 24 (L) 07/17/2018 1123   GFRAA 27 (L) 07/17/2018 1123       Component Value Date/Time   WBC 7.2 07/17/2018 1123   WBC 12.3 (H) 06/19/2018 1415   RBC 2.61 (L) 07/17/2018 1123   HGB 8.1 (L) 07/17/2018 1123   HGB 10.6 (L) 01/07/2018 1402   HCT 24.5 (L) 07/17/2018 1123   HCT 31.3 (L) 01/07/2018 1402   PLT 221 07/17/2018 1123   PLT 293 01/07/2018 1402   MCV 93.9 07/17/2018 1123   MCV 92 01/07/2018 1402   MCH 31.0 07/17/2018 1123   MCHC 33.1 07/17/2018 1123   RDW 13.5 07/17/2018 1123   RDW 14.1 01/07/2018 1402   LYMPHSABS 1.1 07/17/2018 1123   LYMPHSABS 1.6 01/07/2018 1402   MONOABS 0.7 07/17/2018 1123   EOSABS 0.2 07/17/2018 1123   EOSABS 0.1 01/07/2018 1402   BASOSABS 0.0 07/17/2018 1123   BASOSABS 0.0 01/07/2018 1402   -------------------------------  Imaging from last 24 hours (if applicable):  Radiology interpretation: No results found.

## 2018-07-23 NOTE — Progress Notes (Signed)
HEMATOLOGY/ONCOLOGY CLINIC NOTE  Date of Service: 07/24/2018  Patient Care Team: Nolene Ebbs, MD as PCP - General (Internal Medicine) Minus Breeding, MD as PCP - Cardiology (Cardiology)  CHIEF COMPLAINTS/PURPOSE OF CONSULTATION:  Newly diagnosed Light chain myeloma   HISTORY OF PRESENTING ILLNESS:   [name] [name] is a wonderful 72 y.o. male who has been referred to [place] by Dr. 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 The pt reports that he is doing well overall.   Prior to our visit, the pt had a segmental colon resection for invasive adenocarcinoma on 05/21/18. The pt then developed BPH with urinary obstruction, and presented to the ED for fever, diarrhea, and was evaluated to have sepsis. As part of his elevated creatinine work up, he was seen to have concerning serum free light chain elevation, as noted below.   The pt reports that he is moving a lot of urine and has a catheter. He denies losing weight unexpectedly recently. He notes that he is eating well but is feeling constipated.   The pt denies any back pains or new/different headaches. The pt notes that he has been anemic before, most recently 3-4 months ago and was sent to a nephrologist but wasn't able to establish care due to challenge with distance.   The pt denies any fevers or chills. He received a unit of blood today as well.   The pt notes that he lives with a granddaughter and functions independently.   Most recent lab results (06/10/18) of CBC is as follows: all values are WNL except for RBC at 2.21, HGB at 6.8, HCT at 21.8. 06/07/18 SFLC revealed Kappa at 7790.4, Lambda at 34.3, and K:L ratio at 227.13  On review of systems, pt reports some constipation, improved energy levels, improved breathing, urinating, and denies bone pains, back pains, leg swelling, abdominal pains, and any other symptoms.   Interval History:   [name] [name] returns today for management and evaluation of his light  chain myeloma, and C1D22 CyBorD. The patient's last visit with [place] was on 06/19/18. The pt reports that he is doing well overall.   The pt reports that he is not urinating large amounts, and the ease of flow fluctuates. He notes that his bowel movements have become more regular and are normal. He also endorses an improved appetite. He denies any problems tolerating treatment thus far and denies any neuropathy.   The pt denies light headedness and dizziness.   The pt has continued follow up with nephrology in the interim and notes he has received reports that he is improving.   The pt is not currently driving and uses public transportation.   Lab results today (07/24/18) of CBC w/diff, CMP is as follows: all values are WNL except for RBC at 2.47, HGB at 7.8, HCT at 23.4, Potassium at 3.2, Glucose at 132, BUN at 43, Creatinine at 2.67, GFR at 25. 07/24/18 MMP and SFLC are pending 07/24/18 Sed Rate at 58  On review of systems, pt reports improved appetite, eating better, moving his bowels well, feeling tired, and denies light headedness, dizziness, nausea, vomiting, diarrhea, mouth sores, abdominal pains, leg swelling, and any other symptoms.    MEDICAL HISTORY:  Past Medical History:  Diagnosis Date  . Abnormal LFTs (liver function tests)   . Anxiety   . Aortic root dilatation ([place])    a. 12/2015 Echo: mod dil of sinus of valsalva and Ao root (28mm).  b. 53 mm Echo  2018  . BPH with urinary obstruction   . Bradycardia   . 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 CAD (coronary artery disease)    a. 12/2015 NSTEMI/Cath (in setting of PAF):  LM nl, LAD 30p,  LCX 76m, RCA  ok, AM 100%, RPDA1 40, RPDA 2 60 ->Med Rx.  . Childhood asthma   . Chronic diastolic CHF (congestive heart failure) ([place])    a. 12/2015 Echo: EF 50-55%, mod AI, mod Ao root dil, mild MR, mod dil LA, mod RA.  [name] COPD (chronic obstructive pulmonary  disease) ([place])    pt denies at preop  . Diabetes mellitus type II    diet controlled  . History of syncope   . Hyperlipidemia   . Hypertensive heart disease   . Kidney lump 04/04/2010   Overview:  Renal Cell Carcinoma   . Moderate aortic insufficiency    a. 12/2015 Echo: Mod AI.  [name] Paroxysmal atrial fibrillation ([place])    a. 12/2015 started on Xarelto (CHA2DS2VASc = 4-5).  . Pneumonia   . Prostate cancer ([place])   . Renal cell carcinoma ([place])   . Sleep apnea    Does not like  CPAP  . Spinal stenosis in cervical region 10/12/2016    SURGICAL HISTORY: Past Surgical History:  Procedure Laterality Date  . BACK SURGERY    . CARDIAC CATHETERIZATION N/A 12/18/2015   Procedure: Left Heart Cath and Coronary Angiography;  Surgeon: Leonie Man, MD;  Location: [facility];  Service: Cardiovascular;  Laterality: N/A;  . DIAGNOSTIC LAPAROSCOPY     partial colectomy  . LAPAROSCOPIC PARTIAL COLECTOMY Right 05/21/2018   Procedure: LAPAROSCOPIC RIGHT  COLECTOMY ERAS PATHWAY;  Surgeon: Leighton Ruff, MD;  Location: [facility];  Service: General;  Laterality: Right;  . [place]   "replaced a disc"    SOCIAL HISTORY: Social History   Socioeconomic History  . Marital status: Widowed    Spouse name: Not on file  . Number of children: Not on file  . Years of education: Not on file  . Highest education level: Not on file  Occupational History  . Not on file  Social Needs  . Financial resource strain: Not on file  . Food insecurity:    Worry: Not on file    Inability: Not on file  . Transportation needs:    Medical: Not on file    Non-medical: Not on file  Tobacco Use  . Smoking status: Former Smoker    Types: Cigars    Last attempt to quit: 09/02/1977    Years since quitting: 40.9  . Smokeless tobacco: Never Used  . Tobacco comment:    Substance and Sexual Activity  . Alcohol use: Not Currently    Comment: "stopped drinking alcohol in ~ 1980; just drank a little on  the weekends when I did drink"  . Drug use: No  . Sexual activity: Not Currently  Lifestyle  . Physical activity:    Days per week: Not on file    Minutes per session: Not on file  . Stress: Not on file  Relationships  . Social connections:    Talks on phone: Not on file    Gets together: Not on file    Attends religious service: Not on file    Active member of club or organization: Not on file    Attends meetings of clubs or  organizations: Not on file    Relationship status: Not on file  . Intimate partner violence:    Fear of current or ex partner: Not on file    Emotionally abused: Not on file    Physically abused: Not on file    Forced sexual activity: Not on file  Other Topics Concern  . Not on file  Social History Narrative   Lives alone.  Wife died in 04-16-2023.  Lives in apartment.      FAMILY HISTORY: Family History  Problem Relation Age of Onset  . Emphysema Father   . Asthma Father   . Liver disease Father        tumor  . Heart disease Mother   . Hypertension Mother   . Asthma Sister   . Hypertension Son     ALLERGIES:  is allergic to amoxicillin and penicillins.  MEDICATIONS:  Current Outpatient Medications  Medication Sig Dispense Refill  . acyclovir (ZOVIRAX) 400 MG tablet Take 1 tablet (400 mg total) by mouth 2 (two) times daily. 60 tablet 3  . albuterol (PROVENTIL HFA;VENTOLIN HFA) 108 (90 Base) MCG/ACT inhaler Inhale 2 puffs into the lungs every 6 (six) hours as needed for wheezing or shortness of breath. 1 Inhaler 3  . albuterol (PROVENTIL) (5 MG/ML) 0.5% nebulizer solution Take 0.5 mLs (2.5 mg total) by nebulization every 6 (six) hours as needed for wheezing or shortness of breath. 20 mL 12  . amLODipine (NORVASC) 10 MG tablet Take 10 mg by mouth 2 (two) times daily.     [name] atorvastatin (LIPITOR) 80 MG tablet Take 1 tablet (80 mg total) by mouth daily. (Patient taking differently: Take 40 mg by mouth 2 (two) times daily. ) 90 tablet 3  . dexamethasone  (DECADRON) 4 MG tablet Take 5 tablets (20 mg) on days 1, 8, 15, and 22 of chemo. Repeat every 28 days. 30 tablet 3  . finasteride (PROSCAR) 5 MG tablet Take 1 tablet (5 mg total) by mouth daily. 30 tablet 0  . hydrALAZINE (APRESOLINE) 50 MG tablet Take 1 tablet (50 mg total) by mouth every 8 (eight) hours. 90 tablet 0  . ondansetron (ZOFRAN) 8 MG tablet Take 1 tablet (8 mg total) by mouth 2 (two) times daily as needed for refractory nausea / vomiting. Starting on days 4 and 11. 30 tablet 1  . prochlorperazine (COMPAZINE) 10 MG tablet Take 1 tablet (10 mg total) by mouth every 6 (six) hours as needed (Nausea or vomiting). 30 tablet 1  . tamsulosin (FLOMAX) 0.4 MG CAPS capsule Take 1 capsule (0.4 mg total) by mouth daily. 30 capsule 0   No current facility-administered medications for this visit.     REVIEW OF SYSTEMS:    A 10+ POINT REVIEW OF SYSTEMS WAS OBTAINED including neurology, dermatology, psychiatry, cardiac, respiratory, lymph, extremities, GI, GU, Musculoskeletal, constitutional, breasts, reproductive, HEENT.  All pertinent positives are noted in the HPI.  All others are negative.   PHYSICAL EXAMINATION: ECOG PERFORMANCE STATUS: 2-3  . Vitals:   07/24/18 1035  BP: 131/77  Pulse: 70  Resp: 18  Temp: 98.3 F (36.8 C)  SpO2: 98%   Filed Weights   07/24/18 1035  Weight: 221 lb 14.4 oz (100.7 kg)   .Body mass index is 35.82 kg/m.   GENERAL:alert, in no acute distress and comfortable SKIN: no acute rashes, no significant lesions EYES: conjunctival pallor + and non-injected, sclera anicteric OROPHARYNX: MMM, no exudates, no oropharyngeal erythema or ulceration NECK: supple, no JVD  LYMPH:  no palpable lymphadenopathy in the cervical, axillary or inguinal regions LUNGS: clear to auscultation b/l with normal respiratory effort HEART: irregular  ABDOMEN:  normoactive bowel sounds , non tender, not distended. No palpable hepatosplenomegaly.  Extremity: trace pedal  edema PSYCH: alert & oriented x 3 with fluent speech NEURO: no focal motor/sensory deficits    LABORATORY DATA:  I have reviewed the data as listed  . CBC Latest Ref Rng & Units 07/24/2018 07/17/2018 07/03/2018  WBC 4.0 - 10.5 K/uL 5.3 7.2 7.0  Hemoglobin 13.0 - 17.0 g/dL 7.8(L) 8.1(L) 7.9(L)  Hematocrit 39.0 - 52.0 % 23.4(L) 24.5(L) 23.7(L)  Platelets 150 - 400 K/uL 208 221 233    . CMP Latest Ref Rng & Units 07/24/2018 07/17/2018 07/03/2018  Glucose 70 - 99 mg/dL 132(H) 133(H) 145(H)  BUN 8 - 23 mg/dL 43(H) 46(H) 69(H)  Creatinine 0.61 - 1.24 mg/dL 2.67(H) 2.51(H) 3.94(HH)  Sodium 135 - 145 mmol/L 141 141 138  Potassium 3.5 - 5.1 mmol/L 3.2(L) 3.5 3.7  Chloride 98 - 111 mmol/L 105 106 104  CO2 22 - 32 mmol/L 25 26 22   Calcium 8.9 - 10.3 mg/dL 8.9 8.9 8.9  Total Protein 6.5 - 8.1 g/dL 6.7 6.7 7.2  Total Bilirubin 0.3 - 1.2 mg/dL 0.6 0.4 0.4  Alkaline Phos 38 - 126 U/L 98 95 84  AST 15 - 41 U/L 25 20 14(L)  ALT 0 - 44 U/L 18 17 11    Component     Latest Ref Rng & Units 06/07/2018  Total Protein ELP     6.0 - 8.5 g/dL 6.4  Albumin ELP     2.9 - 4.4 g/dL 2.8 (L)  Alpha-1-Globulin     0.0 - 0.4 g/dL 0.4  Alpha-2-Globulin     0.4 - 1.0 g/dL 1.1 (H)  Beta Globulin     0.7 - 1.3 g/dL 0.8  Gamma Globulin     0.4 - 1.8 g/dL 1.3  M-SPIKE, %     Not Observed g/dL Not Observed  SPE Interp.      Comment  Comment      Comment  Globulin, Total     2.2 - 3.9 g/dL 3.6  A/G Ratio     0.7 - 1.7 0.8  Hepatitis B Surface Ag     Negative Negative  HCV Ab     0.0 - 0.9 s/co ratio <0.1  Hep A Ab, IgM     Negative Negative  Hep B Core Ab, IgM     Negative Negative  Kappa free light chain     3.3 - 19.4 mg/L 7,790.4 (H)  Lamda free light chains     5.7 - 26.3 mg/L 34.3 (H)  Kappa, lamda light chain ratio     0.26 - 1.65 227.13 (H)  Myeloperoxidase Abs     0.0 - 9.0 U/mL <9.0  ANCA Proteinase 3     0.0 - 3.5 U/mL 7.2 (H)  ANA Ab, IFA      Negative  C3 Complement     82  - 167 mg/dL 144  Complement C4, Body Fluid     14 - 44 mg/dL 29    06/12/18 BM Bx:    05/21/18 Colon biopsy:    RADIOGRAPHIC STUDIES: I have personally reviewed the radiological images as listed and agreed with the findings in the report. No results found.  ASSESSMENT & PLAN:   72 y.o. male with  1. Newly diagnosed Light chain myeloma Component  Latest Ref Rng & Units 06/07/2018  Hep B Core Ab, IgM     Negative Negative  Kappa free light chain     3.3 - 19.4 mg/L 7,790.4 (H)  Lamda free light chains     5.7 - 26.3 mg/L 34.3 (H)  Kappa, lamda light chain ratio     0.26 - 1.65 227.13 (H)   06/10/18 Bone Survey revealed Slightly patchy ill-defined appearance of the thoracic spine some which is due to overlap of adjacent ribs and lung limiting assessment. The possibility of subtle lytic abnormalities is not excluded as a result. CT may help for better assessment. Otherwise, no aggressive nor suspicious lytic abnormalities are identified.   06/12/18 BM Bx revealed hypercellular bone marrow with trilineage hematopoiesis and 40% kappa restricted plasma cells   2. ARF on CKD -- multiple etiologies - myeloma, sepsis, BPH etc. 3.Imaging concern for Renal cell carcinoma  06/06/18 US Renal revealed Probable medical renal disease changes of the kidneys. BILATERAL simple renal cysts. Additional solid mass at upper pole LEFT kidney 4.9 cm greatest size most likely representing a solid renal neoplasm, demonstrated enhancement on a prior CT exam of 03/24/2018 and now demonstrates an interval increase in size. Additional complicated cystic lesions at the inferior pole of RIGHT kidney and upper pole LEFT kidney, indeterminate, cannot exclude renal neoplasms; consider MR characterization. Marked prostatic enlargement   4. Anemia - likely primarily due to myeloma but also from sepsis, ckd  PLAN -transfuse prn to maintain hgb >8  -06/07/18 SFLC meet criteria for light chain myeloma with Kappa  elevated at 7790 and K:L ratio elevated at 227 -Continue follow up with urology for concerns of renal cancer -Discussed pt labwork today, 07/24/18; HGB slightly decreased to 7.8, Potassium lower at 3.2, Creatinine improved to 2.67, blood counts and chemistries are otherwise stable. Sed Rate improved to 58 -Will order 2 units PRBCs -Last SFLC revealed Kappa reduced to 4982 -07/24/18 SFLC and MMP are pending -The pt has no prohibitive toxicities from continuing C1D22 CyBorD at this time.   -Proceed with PET/CT as ordered, pt was not able to get this scheduled previously     -PET/CT in 1 week for myeloma -PRBC transfusion 2 units on Monday 07/27/2018 -continue Treatment as ordered weekly. Please also schedule C2 -labs weekly RTC with Dr Irene Limbo in 2 weeks    All of the patients questions were answered with apparent satisfaction. The patient knows to call the clinic with any problems, questions or concerns.  The total time spent in the appt was 30 minutes and more than 50% was on counseling and direct patient cares.    Sullivan Lone MD MS AAHIVMS [facility] Hematology/Oncology Physician [facility]  (Office):       [phone] (Work cell):  [phone] (Fax):           [phone]  07/24/2018 11:34 AM  I, Baldwin [place], am acting as a scribe for Dr. Sullivan Lone.   .I have reviewed the above documentation for accuracy and completeness, and I agree with the above. Brunetta Genera MD

## 2018-07-24 ENCOUNTER — Inpatient Hospital Stay: Payer: Medicare Other

## 2018-07-24 ENCOUNTER — Other Ambulatory Visit: Payer: Self-pay | Admitting: Hematology

## 2018-07-24 ENCOUNTER — Inpatient Hospital Stay (HOSPITAL_BASED_OUTPATIENT_CLINIC_OR_DEPARTMENT_OTHER): Payer: Medicare Other | Admitting: Hematology

## 2018-07-24 VITALS — BP 131/77 | HR 70 | Temp 98.3°F | Resp 18 | Ht 66.0 in | Wt 221.9 lb

## 2018-07-24 DIAGNOSIS — I5032 Chronic diastolic (congestive) heart failure: Secondary | ICD-10-CM

## 2018-07-24 DIAGNOSIS — Z79899 Other long term (current) drug therapy: Secondary | ICD-10-CM | POA: Diagnosis not present

## 2018-07-24 DIAGNOSIS — C61 Malignant neoplasm of prostate: Secondary | ICD-10-CM

## 2018-07-24 DIAGNOSIS — I130 Hypertensive heart and chronic kidney disease with heart failure and stage 1 through stage 4 chronic kidney disease, or unspecified chronic kidney disease: Secondary | ICD-10-CM | POA: Diagnosis not present

## 2018-07-24 DIAGNOSIS — Z5111 Encounter for antineoplastic chemotherapy: Secondary | ICD-10-CM | POA: Diagnosis not present

## 2018-07-24 DIAGNOSIS — I2510 Atherosclerotic heart disease of native coronary artery without angina pectoris: Secondary | ICD-10-CM

## 2018-07-24 DIAGNOSIS — Z7189 Other specified counseling: Secondary | ICD-10-CM

## 2018-07-24 DIAGNOSIS — F419 Anxiety disorder, unspecified: Secondary | ICD-10-CM

## 2018-07-24 DIAGNOSIS — N138 Other obstructive and reflux uropathy: Secondary | ICD-10-CM

## 2018-07-24 DIAGNOSIS — Z85528 Personal history of other malignant neoplasm of kidney: Secondary | ICD-10-CM

## 2018-07-24 DIAGNOSIS — N281 Cyst of kidney, acquired: Secondary | ICD-10-CM

## 2018-07-24 DIAGNOSIS — C9000 Multiple myeloma not having achieved remission: Secondary | ICD-10-CM

## 2018-07-24 DIAGNOSIS — E785 Hyperlipidemia, unspecified: Secondary | ICD-10-CM

## 2018-07-24 DIAGNOSIS — I480 Paroxysmal atrial fibrillation: Secondary | ICD-10-CM

## 2018-07-24 DIAGNOSIS — J449 Chronic obstructive pulmonary disease, unspecified: Secondary | ICD-10-CM

## 2018-07-24 DIAGNOSIS — Z87891 Personal history of nicotine dependence: Secondary | ICD-10-CM

## 2018-07-24 DIAGNOSIS — N189 Chronic kidney disease, unspecified: Secondary | ICD-10-CM

## 2018-07-24 DIAGNOSIS — E119 Type 2 diabetes mellitus without complications: Secondary | ICD-10-CM

## 2018-07-24 DIAGNOSIS — R945 Abnormal results of liver function studies: Secondary | ICD-10-CM

## 2018-07-24 DIAGNOSIS — R509 Fever, unspecified: Secondary | ICD-10-CM

## 2018-07-24 DIAGNOSIS — K5900 Constipation, unspecified: Secondary | ICD-10-CM

## 2018-07-24 DIAGNOSIS — R197 Diarrhea, unspecified: Secondary | ICD-10-CM

## 2018-07-24 LAB — CMP (CANCER CENTER ONLY)
ALK PHOS: 98 U/L (ref 38–126)
ALT: 18 U/L (ref 0–44)
AST: 25 U/L (ref 15–41)
Albumin: 3.5 g/dL (ref 3.5–5.0)
Anion gap: 11 (ref 5–15)
BILIRUBIN TOTAL: 0.6 mg/dL (ref 0.3–1.2)
BUN: 43 mg/dL — AB (ref 8–23)
CALCIUM: 8.9 mg/dL (ref 8.9–10.3)
CO2: 25 mmol/L (ref 22–32)
CREATININE: 2.67 mg/dL — AB (ref 0.61–1.24)
Chloride: 105 mmol/L (ref 98–111)
GFR, Est AFR Am: 25 mL/min — ABNORMAL LOW (ref >60)
GFR, Estimated: 22 mL/min — ABNORMAL LOW (ref >60)
Glucose, Bld: 132 mg/dL — ABNORMAL HIGH (ref 70–99)
Potassium: 3.2 mmol/L — ABNORMAL LOW (ref 3.5–5.1)
Sodium: 141 mmol/L (ref 135–145)
TOTAL PROTEIN: 6.7 g/dL (ref 6.5–8.1)

## 2018-07-24 LAB — CBC WITH DIFFERENTIAL/PLATELET
ABS IMMATURE GRANULOCYTES: 0.02 10*3/uL (ref 0.00–0.07)
BASOS ABS: 0 10*3/uL (ref 0.0–0.1)
BASOS PCT: 0 %
Eosinophils Absolute: 0.1 10*3/uL (ref 0.0–0.5)
Eosinophils Relative: 3 %
HCT: 23.4 % — ABNORMAL LOW (ref 39.0–52.0)
Hemoglobin: 7.8 g/dL — ABNORMAL LOW (ref 13.0–17.0)
IMMATURE GRANULOCYTES: 0 %
Lymphocytes Relative: 19 %
Lymphs Abs: 1 10*3/uL (ref 0.7–4.0)
MCH: 31.6 pg (ref 26.0–34.0)
MCHC: 33.3 g/dL (ref 30.0–36.0)
MCV: 94.7 fL (ref 80.0–100.0)
MONOS PCT: 11 %
Monocytes Absolute: 0.6 10*3/uL (ref 0.1–1.0)
NEUTROS ABS: 3.6 10*3/uL (ref 1.7–7.7)
NEUTROS PCT: 67 %
NRBC: 0 % (ref 0.0–0.2)
PLATELETS: 208 10*3/uL (ref 150–400)
RBC: 2.47 MIL/uL — AB (ref 4.22–5.81)
RDW: 13.6 % (ref 11.5–15.5)
WBC: 5.3 10*3/uL (ref 4.0–10.5)

## 2018-07-24 LAB — SEDIMENTATION RATE: Sed Rate: 58 mm/hr — ABNORMAL HIGH (ref 0–16)

## 2018-07-24 LAB — SAMPLE TO BLOOD BANK

## 2018-07-24 MED ORDER — BORTEZOMIB CHEMO SQ INJECTION 3.5 MG (2.5MG/ML)
1.5000 mg/m2 | Freq: Once | INTRAMUSCULAR | Status: AC
  Administered 2018-07-24: 3.25 mg via SUBCUTANEOUS
  Filled 2018-07-24: qty 1.3

## 2018-07-24 MED ORDER — ACETAMINOPHEN 325 MG PO TABS
650.0000 mg | ORAL_TABLET | Freq: Once | ORAL | Status: AC
  Administered 2018-07-24: 650 mg via ORAL

## 2018-07-24 MED ORDER — SODIUM CHLORIDE 0.9 % IV SOLN
Freq: Once | INTRAVENOUS | Status: AC
  Administered 2018-07-24: 12:00:00 via INTRAVENOUS
  Filled 2018-07-24: qty 250

## 2018-07-24 MED ORDER — PALONOSETRON HCL INJECTION 0.25 MG/5ML
INTRAVENOUS | Status: AC
  Filled 2018-07-24: qty 5

## 2018-07-24 MED ORDER — PALONOSETRON HCL INJECTION 0.25 MG/5ML
0.2500 mg | Freq: Once | INTRAVENOUS | Status: AC
  Administered 2018-07-24: 0.25 mg via INTRAVENOUS

## 2018-07-24 MED ORDER — SODIUM CHLORIDE 0.9 % IV SOLN
300.0000 mg/m2 | Freq: Once | INTRAVENOUS | Status: AC
  Administered 2018-07-24: 660 mg via INTRAVENOUS
  Filled 2018-07-24: qty 33

## 2018-07-24 MED ORDER — ACETAMINOPHEN 325 MG PO TABS
ORAL_TABLET | ORAL | Status: AC
  Filled 2018-07-24: qty 2

## 2018-07-24 NOTE — Patient Instructions (Signed)
[facility] Discharge Instructions for Patients Receiving Chemotherapy  Today you received the following chemotherapy agents Bortezomib (VELCADE) & Cyclophosphamide (CYTOXAN).  To help prevent nausea and vomiting after your treatment, we encourage you to take your nausea medication as prescribed.   If you develop nausea and vomiting that is not controlled by your nausea medication, call the clinic.   BELOW ARE SYMPTOMS THAT SHOULD BE REPORTED IMMEDIATELY:  *FEVER GREATER THAN 100.5 F  *CHILLS WITH OR WITHOUT FEVER  NAUSEA AND VOMITING THAT IS NOT CONTROLLED WITH YOUR NAUSEA MEDICATION  *UNUSUAL SHORTNESS OF BREATH  *UNUSUAL BRUISING OR BLEEDING  TENDERNESS IN MOUTH AND THROAT WITH OR WITHOUT PRESENCE OF ULCERS  *URINARY PROBLEMS  *BOWEL PROBLEMS  UNUSUAL RASH Items with * indicate a potential emergency and should be followed up as soon as possible.  Feel free to call the clinic should you have any questions or concerns. The clinic phone number is [phone].  Please show the [place] at check-in to the Emergency Department and triage nurse.

## 2018-07-24 NOTE — Progress Notes (Unsigned)
Cbc

## 2018-07-24 NOTE — Progress Notes (Signed)
Per Dr. Irene Limbo: OK to treat today with Hgb 7.2 and Cr 2.67.

## 2018-07-27 LAB — KAPPA/LAMBDA LIGHT CHAINS
KAPPA FREE LGHT CHN: 4638.2 mg/L — AB (ref 3.3–19.4)
KAPPA, LAMDA LIGHT CHAIN RATIO: 338.55 — AB (ref 0.26–1.65)
Lambda free light chains: 13.7 mg/L (ref 5.7–26.3)

## 2018-07-29 LAB — MULTIPLE MYELOMA PANEL, SERUM
ALBUMIN/GLOB SERPL: 1.4 (ref 0.7–1.7)
Albumin SerPl Elph-Mcnc: 3.6 g/dL (ref 2.9–4.4)
Alpha 1: 0.2 g/dL (ref 0.0–0.4)
Alpha2 Glob SerPl Elph-Mcnc: 0.7 g/dL (ref 0.4–1.0)
B-Globulin SerPl Elph-Mcnc: 0.8 g/dL (ref 0.7–1.3)
Gamma Glob SerPl Elph-Mcnc: 0.8 g/dL (ref 0.4–1.8)
Globulin, Total: 2.6 g/dL (ref 2.2–3.9)
IGA: 88 mg/dL (ref 61–437)
IGM (IMMUNOGLOBULIN M), SRM: 25 mg/dL (ref 15–143)
IgG (Immunoglobin G), Serum: 1117 mg/dL (ref 700–1600)
TOTAL PROTEIN ELP: 6.2 g/dL (ref 6.0–8.5)

## 2018-08-04 ENCOUNTER — Ambulatory Visit (HOSPITAL_COMMUNITY): Payer: Medicare Other

## 2018-08-05 ENCOUNTER — Telehealth: Payer: Self-pay

## 2018-08-05 NOTE — Telephone Encounter (Signed)
Printed calender of appointments per 11/22 los. Will talk with Irene Limbo on 12/5 concerning 2 unit of blood

## 2018-08-06 NOTE — Progress Notes (Signed)
HEMATOLOGY/ONCOLOGY CLINIC NOTE  Date of Service: 08/07/2018  Patient Care Team: Nolene Ebbs, MD as PCP - General (Internal Medicine) Minus Breeding, MD as PCP - Cardiology (Cardiology)  CHIEF COMPLAINTS/PURPOSE OF CONSULTATION:  Newly diagnosed Light chain myeloma   HISTORY OF PRESENTING ILLNESS:   [name] [name] is a wonderful 72 y.o. male who has been referred to [place] by Dr. 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 The pt reports that he is doing well overall.   Prior to our visit, the pt had a segmental colon resection for invasive adenocarcinoma on 05/21/18. The pt then developed BPH with urinary obstruction, and presented to the ED for fever, diarrhea, and was evaluated to have sepsis. As part of his elevated creatinine work up, he was seen to have concerning serum free light chain elevation, as noted below.   The pt reports that he is moving a lot of urine and has a catheter. He denies losing weight unexpectedly recently. He notes that he is eating well but is feeling constipated.   The pt denies any back pains or new/different headaches. The pt notes that he has been anemic before, most recently 3-4 months ago and was sent to a nephrologist but wasn't able to establish care due to challenge with distance.   The pt denies any fevers or chills. He received a unit of blood today as well.   The pt notes that he lives with a granddaughter and functions independently.   Most recent lab results (06/10/18) of CBC is as follows: all values are WNL except for RBC at 2.21, HGB at 6.8, HCT at 21.8. 06/07/18 SFLC revealed Kappa at 7790.4, Lambda at 34.3, and K:L ratio at 227.13  On review of systems, pt reports some constipation, improved energy levels, improved breathing, urinating, and denies bone pains, back pains, leg swelling, abdominal pains, and any other symptoms.   Interval History:   [name] [name] returns today for management and evaluation of his light  chain myeloma, and C2 CyBorD. The patient's last visit with [place] was on 07/24/18. The pt reports that he is doing well overall.   The pt reports that he is going to visit his ENT on 08/18/18 as he expresses concerns for a "tissue collapse" in his nose. He uses a nose-only CPAP, but notes difficulty breathing through his nose for the past several days. The pt will also be establishing care with a new PCP, Molli Barrows, FNP next week.   The pt notes that his fatigue has slightly improved but still endorses low energy levels. He was not able to get the blood transfusion that was previously ordered due to difficulty with transportation and scheduling.  The pt notes that he continues to tolerate CyBorD well and notes that he is eating well, and denies any nausea or vomiting.   Lab results today (08/07/18) of CBC w/diff and CMP is as follows: all values are WNL except for RBC at 2.68, HGB at 8.4, HCT at 25.7, Potassium at 3.4, Glucose at 120, BUN at 29, Creatinine at 2.10, Alk Phos at 129, GFR at 35. 08/07/18 MMP and SFLC are pending  On review of systems, pt reports low energy levels, difficulty breathing through nose, eating well, and denies feeling weak, dizziness, light headedness, nausea, vomiting, and any other symtoms.   MEDICAL HISTORY:  Past Medical History:  Diagnosis Date  . Abnormal LFTs (liver function tests)   . Anxiety   . Aortic root dilatation (HCC)  a. 12/2015 Echo: mod dil of sinus of valsalva and Ao root (62m).  b. 53 mm Echo 2018  . BPH with urinary obstruction   . Bradycardia   . 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 CAD (coronary artery disease)    a. 12/2015 NSTEMI/Cath (in setting of PAF):  LM nl, LAD 30p,  LCX 322mRCA  ok, AM 100%, RPDA1 40, RPDA 2 60 ->Med Rx.  . Childhood asthma   . Chronic diastolic CHF (congestive heart failure) ([place]   a. 12/2015 Echo: EF 50-55%, mod AI, mod Ao  root dil, mild MR, mod dil LA, mod RA.  . Marland KitchenOPD (chronic obstructive pulmonary disease) ([place]   pt denies at preop  . Diabetes mellitus type II    diet controlled  . History of syncope   . Hyperlipidemia   . Hypertensive heart disease   . Kidney lump 04/04/2010   Overview:  Renal Cell Carcinoma   . Moderate aortic insufficiency    a. 12/2015 Echo: Mod AI.  . Marland Kitchenaroxysmal atrial fibrillation ([place]   a. 12/2015 started on Xarelto (CHA2DS2VASc = 4-5).  . Pneumonia   . Prostate cancer ([place]  . Renal cell carcinoma ([facility]  . Sleep apnea    Does not like  CPAP  . Spinal stenosis in cervical region 10/12/2016    SURGICAL HISTORY: Past Surgical History:  Procedure Laterality Date  . BACK SURGERY    . CARDIAC CATHETERIZATION N/A 12/18/2015   Procedure: Left Heart Cath and Coronary Angiography;  Surgeon: DaLeonie ManMD;  [facility];  Service: Cardiovascular;  Laterality: N/A;  . DIAGNOSTIC LAPAROSCOPY     partial colectomy  . LAPAROSCOPIC PARTIAL COLECTOMY Right 05/21/2018   Procedure: LAPAROSCOPIC RIGHT  COLECTOMY ERAS PATHWAY;  Surgeon: ThLeighton RuffMD;  Location: [facility];  Service: General;  Laterality: Right;  . [place] "replaced a disc"    SOCIAL HISTORY: Social History   Socioeconomic History  . Marital status: Widowed    Spouse name: Not on file  . Number of children: Not on file  . Years of education: Not on file  . Highest education level: Not on file  Occupational History  . Not on file  Social Needs  . Financial resource strain: Not on file  . Food insecurity:    Worry: Not on file    Inability: Not on file  . Transportation needs:    Medical: Not on file    Non-medical: Not on file  Tobacco Use  . Smoking status: Former Smoker    Types: Cigars    Last attempt to quit: 09/02/1977    Years since quitting: 40.9  . Smokeless tobacco: Never Used  . Tobacco comment:    Substance and Sexual Activity  . Alcohol use: Not  Currently    Comment: "stopped drinking alcohol in ~ 1980; just drank a little on the weekends when I did drink"  . Drug use: No  . Sexual activity: Not Currently  Lifestyle  . Physical activity:    Days per week: Not on file    Minutes per session: Not on file  . Stress: Not on file  Relationships  . Social connections:    Talks on phone: Not on file    Gets together: Not on file    Attends religious service: Not on file  Active member of club or organization: Not on file    Attends meetings of clubs or organizations: Not on file    Relationship status: Not on file  . Intimate partner violence:    Fear of current or ex partner: Not on file    Emotionally abused: Not on file    Physically abused: Not on file    Forced sexual activity: Not on file  Other Topics Concern  . Not on file  Social History Narrative   Lives alone.  Wife died in 04-18-23.  Lives in apartment.      FAMILY HISTORY: Family History  Problem Relation Age of Onset  . Emphysema Father   . Asthma Father   . Liver disease Father        tumor  . Heart disease Mother   . Hypertension Mother   . Asthma Sister   . Hypertension Son     ALLERGIES:  is allergic to amoxicillin and penicillins.  MEDICATIONS:  Current Outpatient Medications  Medication Sig Dispense Refill  . acyclovir (ZOVIRAX) 400 MG tablet Take 1 tablet (400 mg total) by mouth 2 (two) times daily. 60 tablet 3  . albuterol (PROVENTIL HFA;VENTOLIN HFA) 108 (90 Base) MCG/ACT inhaler Inhale 2 puffs into the lungs every 6 (six) hours as needed for wheezing or shortness of breath. 1 Inhaler 3  . albuterol (PROVENTIL) (5 MG/ML) 0.5% nebulizer solution Take 0.5 mLs (2.5 mg total) by nebulization every 6 (six) hours as needed for wheezing or shortness of breath. 20 mL 12  . amLODipine (NORVASC) 10 MG tablet Take 10 mg by mouth 2 (two) times daily.     [name] atorvastatin (LIPITOR) 80 MG tablet Take 1 tablet (80 mg total) by mouth daily. (Patient taking  differently: Take 40 mg by mouth 2 (two) times daily. ) 90 tablet 3  . dexamethasone (DECADRON) 4 MG tablet Take 5 tablets (20 mg) on days 1, 8, 15, and 22 of chemo. Repeat every 28 days. 30 tablet 3  . finasteride (PROSCAR) 5 MG tablet Take 1 tablet (5 mg total) by mouth daily. 30 tablet 0  . hydrALAZINE (APRESOLINE) 50 MG tablet Take 1 tablet (50 mg total) by mouth every 8 (eight) hours. 90 tablet 0  . ondansetron (ZOFRAN) 8 MG tablet Take 1 tablet (8 mg total) by mouth 2 (two) times daily as needed for refractory nausea / vomiting. Starting on days 4 and 11. 30 tablet 1  . prochlorperazine (COMPAZINE) 10 MG tablet Take 1 tablet (10 mg total) by mouth every 6 (six) hours as needed (Nausea or vomiting). 30 tablet 1  . tamsulosin (FLOMAX) 0.4 MG CAPS capsule Take 1 capsule (0.4 mg total) by mouth daily. 30 capsule 0   No current facility-administered medications for this visit.     REVIEW OF SYSTEMS:    A 10+ POINT REVIEW OF SYSTEMS WAS OBTAINED including neurology, dermatology, psychiatry, cardiac, respiratory, lymph, extremities, GI, GU, Musculoskeletal, constitutional, breasts, reproductive, HEENT.  All pertinent positives are noted in the HPI.  All others are negative.    PHYSICAL EXAMINATION: ECOG PERFORMANCE STATUS: 2-3  . Vitals:   08/07/18 1057  BP: 119/80  Pulse: 73  Resp: 18  Temp: (!) 97.5 F (36.4 C)  SpO2: 97%   Filed Weights   08/07/18 1057  Weight: 222 lb 11.2 oz (101 kg)   .Body mass index is 35.94 kg/m.   GENERAL:alert, in no acute distress and comfortable SKIN: no acute rashes, no significant lesions EYES: conjunctival  pallor + and no injected, sclera anicteric OROPHARYNX: MMM, no exudates, no oropharyngeal erythema or ulceration NECK: supple, no JVD LYMPH:  no palpable lymphadenopathy in the cervical, axillary or inguinal regions LUNGS: clear to auscultation b/l with normal respiratory effort HEART: regular rate & rhythm ABDOMEN:  normoactive bowel sounds  , non tender, not distended. No palpable hepatosplenomegaly.  Extremity: no pedal edema PSYCH: alert & oriented x 3 with fluent speech NEURO: no focal motor/sensory deficits   LABORATORY DATA:  I have reviewed the data as listed  . CBC Latest Ref Rng & Units 08/07/2018 07/24/2018 07/17/2018  WBC 4.0 - 10.5 K/uL 4.9 5.3 7.2  Hemoglobin 13.0 - 17.0 g/dL 8.4(L) 7.8(L) 8.1(L)  Hematocrit 39.0 - 52.0 % 25.7(L) 23.4(L) 24.5(L)  Platelets 150 - 400 K/uL 195 208 221    . CMP Latest Ref Rng & Units 08/07/2018 07/24/2018 07/17/2018  Glucose 70 - 99 mg/dL 120(H) 132(H) 133(H)  BUN 8 - 23 mg/dL 29(H) 43(H) 46(H)  Creatinine 0.61 - 1.24 mg/dL 2.10(H) 2.67(H) 2.51(H)  Sodium 135 - 145 mmol/L 142 141 141  Potassium 3.5 - 5.1 mmol/L 3.4(L) 3.2(L) 3.5  Chloride 98 - 111 mmol/L 104 105 106  CO2 22 - 32 mmol/L 26 25 26   Calcium 8.9 - 10.3 mg/dL 9.0 8.9 8.9  Total Protein 6.5 - 8.1 g/dL 6.8 6.7 6.7  Total Bilirubin 0.3 - 1.2 mg/dL 0.8 0.6 0.4  Alkaline Phos 38 - 126 U/L 129(H) 98 95  AST 15 - 41 U/L 26 25 20   ALT 0 - 44 U/L 23 18 17    Component     Latest Ref Rng & Units 06/07/2018  Total Protein ELP     6.0 - 8.5 g/dL 6.4  Albumin ELP     2.9 - 4.4 g/dL 2.8 (L)  Alpha-1-Globulin     0.0 - 0.4 g/dL 0.4  Alpha-2-Globulin     0.4 - 1.0 g/dL 1.1 (H)  Beta Globulin     0.7 - 1.3 g/dL 0.8  Gamma Globulin     0.4 - 1.8 g/dL 1.3  M-SPIKE, %     Not Observed g/dL Not Observed  SPE Interp.      Comment  Comment      Comment  Globulin, Total     2.2 - 3.9 g/dL 3.6  A/G Ratio     0.7 - 1.7 0.8  Hepatitis B Surface Ag     Negative Negative  HCV Ab     0.0 - 0.9 s/co ratio <0.1  Hep A Ab, IgM     Negative Negative  Hep B Core Ab, IgM     Negative Negative  Kappa free light chain     3.3 - 19.4 mg/L 7,790.4 (H)  Lamda free light chains     5.7 - 26.3 mg/L 34.3 (H)  Kappa, lamda light chain ratio     0.26 - 1.65 227.13 (H)  Myeloperoxidase Abs     0.0 - 9.0 U/mL <9.0  ANCA  Proteinase 3     0.0 - 3.5 U/mL 7.2 (H)  ANA Ab, IFA      Negative  C3 Complement     82 - 167 mg/dL 144  Complement C4, Body Fluid     14 - 44 mg/dL 29    06/12/18 BM Bx:    05/21/18 Colon biopsy:    RADIOGRAPHIC STUDIES: I have personally reviewed the radiological images as listed and agreed with the findings in the report. No results  found.  ASSESSMENT & PLAN:   72 y.o. male with  1. Newly diagnosed Light chain myeloma Component     Latest Ref Rng & Units 06/07/2018  Hep B Core Ab, IgM     Negative Negative  Kappa free light chain     3.3 - 19.4 mg/L 7,790.4 (H)  Lamda free light chains     5.7 - 26.3 mg/L 34.3 (H)  Kappa, lamda light chain ratio     0.26 - 1.65 227.13 (H)   06/10/18 Bone Survey revealed Slightly patchy ill-defined appearance of the thoracic spine some which is due to overlap of adjacent ribs and lung limiting assessment. The possibility of subtle lytic abnormalities is not excluded as a result. CT may help for better assessment. Otherwise, no aggressive nor suspicious lytic abnormalities are identified.   06/12/18 BM Bx revealed hypercellular bone marrow with trilineage hematopoiesis and 40% kappa restricted plasma cells   2. ARF on CKD -- multiple etiologies - myeloma, sepsis, BPH etc. 3.Imaging concern for Renal cell carcinoma  06/06/18 US Renal revealed Probable medical renal disease changes of the kidneys. BILATERAL simple renal cysts. Additional solid mass at upper pole LEFT kidney 4.9 cm greatest size most likely representing a solid renal neoplasm, demonstrated enhancement on a prior CT exam of 03/24/2018 and now demonstrates an interval increase in size. Additional complicated cystic lesions at the inferior pole of RIGHT kidney and upper pole LEFT kidney, indeterminate, cannot exclude renal neoplasms; consider MR characterization. Marked prostatic enlargement   4. Anemia - likely primarily due to myeloma but also from sepsis,  ckd  PLAN -transfuse prn to maintain hgb >8  -06/07/18 SFLC meet criteria for light chain myeloma with Kappa elevated at 7790 and K:L ratio elevated at 227 -Continue follow up with urology for concerns of renal cancer -Discussed pt labwork today, 08/07/18; anemia with HGB at 8.4, normal WBC at 4.9k, normal PLT at 195k, Potassium slightly low at 3.4 but improved, Creatinine improved to 2.10 -Proceed with PRBC transfusion tomorrow as previously ordered, for symptomatic anemia -08/07/18 MMP and SFLC are pending. Last available SFLC from 07/24/18 revealed Kappa decreased to 4638.2  -Proceed with PET/CT scheduled for 08/19/18 -The pt has no prohibitive toxicities from continuing C2 CyBorD at this time.  -Will see the pt back in 4 weeks   F/u for PRBC transfusion as ordered tomorrow AM at 8:45AM Please schedule C2 and C3 of treatment as ordered (8 weeks of treatment) Labs weekly RTC with Dr Irene Limbo in 4 weeks and 8 weeks with labs    All of the patients questions were answered with apparent satisfaction. The patient knows to call the clinic with any problems, questions or concerns.  The total time spent in the appt was 25 minutes and more than 50% was on counseling and direct patient cares.    Sullivan Lone MD MS AAHIVMS [facility] Hematology/Oncology Physician [facility]  (Office):       [phone] (Work cell):  [phone] (Fax):           [phone]  08/07/2018 11:44 AM  I, Baldwin [place], am acting as a scribe for Dr. Sullivan Lone.   .I have reviewed the above documentation for accuracy and completeness, and I agree with the above. Brunetta Genera MD

## 2018-08-07 ENCOUNTER — Inpatient Hospital Stay (HOSPITAL_BASED_OUTPATIENT_CLINIC_OR_DEPARTMENT_OTHER): Payer: Medicare Other | Admitting: Hematology

## 2018-08-07 ENCOUNTER — Other Ambulatory Visit: Payer: Self-pay | Admitting: *Deleted

## 2018-08-07 ENCOUNTER — Inpatient Hospital Stay: Payer: Medicare Other | Attending: Hematology

## 2018-08-07 ENCOUNTER — Inpatient Hospital Stay: Payer: Medicare Other

## 2018-08-07 VITALS — BP 119/80 | HR 73 | Temp 97.5°F | Resp 18 | Ht 66.0 in | Wt 222.7 lb

## 2018-08-07 DIAGNOSIS — I480 Paroxysmal atrial fibrillation: Secondary | ICD-10-CM | POA: Diagnosis not present

## 2018-08-07 DIAGNOSIS — R5383 Other fatigue: Secondary | ICD-10-CM | POA: Diagnosis not present

## 2018-08-07 DIAGNOSIS — J449 Chronic obstructive pulmonary disease, unspecified: Secondary | ICD-10-CM | POA: Diagnosis not present

## 2018-08-07 DIAGNOSIS — I351 Nonrheumatic aortic (valve) insufficiency: Secondary | ICD-10-CM

## 2018-08-07 DIAGNOSIS — Z87891 Personal history of nicotine dependence: Secondary | ICD-10-CM

## 2018-08-07 DIAGNOSIS — K5900 Constipation, unspecified: Secondary | ICD-10-CM | POA: Diagnosis not present

## 2018-08-07 DIAGNOSIS — Z5111 Encounter for antineoplastic chemotherapy: Secondary | ICD-10-CM | POA: Insufficient documentation

## 2018-08-07 DIAGNOSIS — F419 Anxiety disorder, unspecified: Secondary | ICD-10-CM | POA: Diagnosis not present

## 2018-08-07 DIAGNOSIS — Z79899 Other long term (current) drug therapy: Secondary | ICD-10-CM | POA: Diagnosis not present

## 2018-08-07 DIAGNOSIS — Z85528 Personal history of other malignant neoplasm of kidney: Secondary | ICD-10-CM | POA: Diagnosis not present

## 2018-08-07 DIAGNOSIS — C9000 Multiple myeloma not having achieved remission: Secondary | ICD-10-CM | POA: Diagnosis present

## 2018-08-07 DIAGNOSIS — I5032 Chronic diastolic (congestive) heart failure: Secondary | ICD-10-CM | POA: Diagnosis not present

## 2018-08-07 DIAGNOSIS — I2510 Atherosclerotic heart disease of native coronary artery without angina pectoris: Secondary | ICD-10-CM

## 2018-08-07 DIAGNOSIS — E785 Hyperlipidemia, unspecified: Secondary | ICD-10-CM

## 2018-08-07 DIAGNOSIS — Z8546 Personal history of malignant neoplasm of prostate: Secondary | ICD-10-CM

## 2018-08-07 DIAGNOSIS — E119 Type 2 diabetes mellitus without complications: Secondary | ICD-10-CM | POA: Diagnosis not present

## 2018-08-07 DIAGNOSIS — R948 Abnormal results of function studies of other organs and systems: Secondary | ICD-10-CM | POA: Insufficient documentation

## 2018-08-07 DIAGNOSIS — Z7189 Other specified counseling: Secondary | ICD-10-CM

## 2018-08-07 DIAGNOSIS — D649 Anemia, unspecified: Secondary | ICD-10-CM

## 2018-08-07 LAB — CMP (CANCER CENTER ONLY)
ALBUMIN: 3.7 g/dL (ref 3.5–5.0)
ALT: 23 U/L (ref 0–44)
AST: 26 U/L (ref 15–41)
Alkaline Phosphatase: 129 U/L — ABNORMAL HIGH (ref 38–126)
Anion gap: 12 (ref 5–15)
BUN: 29 mg/dL — AB (ref 8–23)
CHLORIDE: 104 mmol/L (ref 98–111)
CO2: 26 mmol/L (ref 22–32)
Calcium: 9 mg/dL (ref 8.9–10.3)
Creatinine: 2.1 mg/dL — ABNORMAL HIGH (ref 0.61–1.24)
GFR, EST NON AFRICAN AMERICAN: 30 mL/min — AB (ref >60)
GFR, Est AFR Am: 35 mL/min — ABNORMAL LOW (ref >60)
Glucose, Bld: 120 mg/dL — ABNORMAL HIGH (ref 70–99)
POTASSIUM: 3.4 mmol/L — AB (ref 3.5–5.1)
SODIUM: 142 mmol/L (ref 135–145)
Total Bilirubin: 0.8 mg/dL (ref 0.3–1.2)
Total Protein: 6.8 g/dL (ref 6.5–8.1)

## 2018-08-07 LAB — CBC WITH DIFFERENTIAL/PLATELET
Abs Immature Granulocytes: 0.01 10*3/uL (ref 0.00–0.07)
BASOS PCT: 0 %
Basophils Absolute: 0 10*3/uL (ref 0.0–0.1)
EOS ABS: 0.2 10*3/uL (ref 0.0–0.5)
EOS PCT: 4 %
HEMATOCRIT: 25.7 % — AB (ref 39.0–52.0)
Hemoglobin: 8.4 g/dL — ABNORMAL LOW (ref 13.0–17.0)
IMMATURE GRANULOCYTES: 0 %
LYMPHS ABS: 1.1 10*3/uL (ref 0.7–4.0)
Lymphocytes Relative: 22 %
MCH: 31.3 pg (ref 26.0–34.0)
MCHC: 32.7 g/dL (ref 30.0–36.0)
MCV: 95.9 fL (ref 80.0–100.0)
MONOS PCT: 10 %
Monocytes Absolute: 0.5 10*3/uL (ref 0.1–1.0)
Neutro Abs: 3.1 10*3/uL (ref 1.7–7.7)
Neutrophils Relative %: 64 %
PLATELETS: 195 10*3/uL (ref 150–400)
RBC: 2.68 MIL/uL — ABNORMAL LOW (ref 4.22–5.81)
RDW: 14.7 % (ref 11.5–15.5)
WBC: 4.9 10*3/uL (ref 4.0–10.5)
nRBC: 0 % (ref 0.0–0.2)

## 2018-08-07 LAB — SAMPLE TO BLOOD BANK

## 2018-08-07 MED ORDER — SODIUM CHLORIDE 0.9 % IV SOLN
300.0000 mg/m2 | Freq: Once | INTRAVENOUS | Status: AC
  Administered 2018-08-07: 660 mg via INTRAVENOUS
  Filled 2018-08-07: qty 33

## 2018-08-07 MED ORDER — ACETAMINOPHEN 325 MG PO TABS
650.0000 mg | ORAL_TABLET | Freq: Once | ORAL | Status: AC
  Administered 2018-08-07: 650 mg via ORAL

## 2018-08-07 MED ORDER — ACETAMINOPHEN 325 MG PO TABS
ORAL_TABLET | ORAL | Status: AC
  Filled 2018-08-07: qty 2

## 2018-08-07 MED ORDER — PALONOSETRON HCL INJECTION 0.25 MG/5ML
0.2500 mg | Freq: Once | INTRAVENOUS | Status: AC
  Administered 2018-08-07: 0.25 mg via INTRAVENOUS

## 2018-08-07 MED ORDER — SODIUM CHLORIDE 0.9 % IV SOLN
Freq: Once | INTRAVENOUS | Status: AC
  Administered 2018-08-07: 12:00:00 via INTRAVENOUS
  Filled 2018-08-07: qty 250

## 2018-08-07 MED ORDER — PALONOSETRON HCL INJECTION 0.25 MG/5ML
INTRAVENOUS | Status: AC
  Filled 2018-08-07: qty 5

## 2018-08-07 MED ORDER — BORTEZOMIB CHEMO SQ INJECTION 3.5 MG (2.5MG/ML)
1.5000 mg/m2 | Freq: Once | INTRAMUSCULAR | Status: AC
  Administered 2018-08-07: 3.25 mg via SUBCUTANEOUS
  Filled 2018-08-07: qty 1.3

## 2018-08-07 NOTE — Progress Notes (Signed)
Per Dr. Irene Limbo: OK to treat today with Cr 2.10

## 2018-08-07 NOTE — Patient Instructions (Signed)
[name] [name] Discharge Instructions for Patients Receiving Chemotherapy  Today you received the following chemotherapy agents Bortezomib (VELCADE) & Cyclophosphamide (CYTOXAN).  To help prevent nausea and vomiting after your treatment, we encourage you to take your nausea medication as prescribed.   If you develop nausea and vomiting that is not controlled by your nausea medication, call the clinic.   BELOW ARE SYMPTOMS THAT SHOULD BE REPORTED IMMEDIATELY:  *FEVER GREATER THAN 100.5 F  *CHILLS WITH OR WITHOUT FEVER  NAUSEA AND VOMITING THAT IS NOT CONTROLLED WITH YOUR NAUSEA MEDICATION  *UNUSUAL SHORTNESS OF BREATH  *UNUSUAL BRUISING OR BLEEDING  TENDERNESS IN MOUTH AND THROAT WITH OR WITHOUT PRESENCE OF ULCERS  *URINARY PROBLEMS  *BOWEL PROBLEMS  UNUSUAL RASH Items with * indicate a potential emergency and should be followed up as soon as possible.  Feel free to call the clinic should you have any questions or concerns. The clinic phone number is [phone].  Please show the [place] at check-in to the Emergency Department and triage nurse.

## 2018-08-08 ENCOUNTER — Inpatient Hospital Stay: Payer: Medicare Other

## 2018-08-08 DIAGNOSIS — C9000 Multiple myeloma not having achieved remission: Secondary | ICD-10-CM | POA: Diagnosis not present

## 2018-08-08 DIAGNOSIS — D649 Anemia, unspecified: Secondary | ICD-10-CM

## 2018-08-08 LAB — ABO/RH: ABO/RH(D): A POS

## 2018-08-08 LAB — PREPARE RBC (CROSSMATCH)

## 2018-08-08 MED ORDER — FUROSEMIDE 10 MG/ML IJ SOLN: 20.0000 mg | Freq: Once | INTRAMUSCULAR | Status: DC

## 2018-08-08 MED ORDER — ACETAMINOPHEN 325 MG PO TABS
ORAL_TABLET | ORAL | Status: AC
  Filled 2018-08-08: qty 2

## 2018-08-08 MED ORDER — ACETAMINOPHEN 325 MG PO TABS
650.0000 mg | ORAL_TABLET | Freq: Once | ORAL | Status: AC
  Administered 2018-08-08: 650 mg via ORAL

## 2018-08-08 NOTE — Patient Instructions (Signed)
Blood Transfusion, Adult, Care After This sheet gives you information about how to care for yourself after your procedure. Your health care provider may also give you more specific instructions. If you have problems or questions, contact your health care provider. What can I expect after the procedure? After your procedure, it is common to have:  Bruising and soreness where the IV tube was inserted.  Headache.  Follow these instructions at home:  Take over-the-counter and prescription medicines only as told by your health care provider.  Return to your normal activities as told by your health care provider.  Follow instructions from your health care provider about how to take care of your IV insertion site. Make sure you: ? Wash your hands with soap and water before you change your bandage (dressing). If soap and water are not available, use hand sanitizer. ? Change your dressing as told by your health care provider.  Check your IV insertion site every day for signs of infection. Check for: ? More redness, swelling, or pain. ? More fluid or blood. ? Warmth. ? Pus or a bad smell. Contact a health care provider if:  You have more redness, swelling, or pain around the IV insertion site.  You have more fluid or blood coming from the IV insertion site.  Your IV insertion site feels warm to the touch.  You have pus or a bad smell coming from the IV insertion site.  Your urine turns pink, red, or brown.  You feel weak after doing your normal activities. Get help right away if:  You have signs of a serious allergic or immune system reaction, including: ? Itchiness. ? Hives. ? Trouble breathing. ? Anxiety. ? Chest or lower back pain. ? Fever, flushing, and chills. ? Rapid pulse. ? Rash. ? Diarrhea. ? Vomiting. ? Dark urine. ? Serious headache. ? Dizziness. ? Stiff neck. ? Yellow coloration of the face or the white parts of the eyes (jaundice). This information is not  intended to replace advice given to you by your health care provider. Make sure you discuss any questions you have with your health care provider. Document Released: 09/09/2014 Document Revised: 04/17/2016 Document Reviewed: 03/04/2016 Elsevier Interactive Patient Education  2018 [employer]vier Inc.  

## 2018-08-08 NOTE — Progress Notes (Signed)
Delay in care -  Patient arrived to clinic today without his blood bank bracelet on - had to redraw labs peripherally and send to blood bank. Patient reeducated about blood bank bracelets and made aware of the added length of treatment.  OK to only give one unit of per MD prepare 1 unit order he sent to blood bank.   Spoke with MD Alvy Bimler - OK to not give Lasix since patient only got one unit of PRBC and he is urinating so frequently with his Flomax today and he has to drive himself home. He forgot his HTN med this am and stated he will take it when he gets home, he feels well.

## 2018-08-09 LAB — TYPE AND SCREEN
ABO/RH(D): A POS
ANTIBODY SCREEN: NEGATIVE
Unit division: 0

## 2018-08-09 LAB — BPAM RBC
Blood Product Expiration Date: 201912272359
ISSUE DATE / TIME: 201912070950
UNIT TYPE AND RH: 6200

## 2018-08-10 ENCOUNTER — Ambulatory Visit (HOSPITAL_COMMUNITY): Payer: Medicare Other

## 2018-08-10 ENCOUNTER — Ambulatory Visit: Payer: Medicare Other | Admitting: Family Medicine

## 2018-08-10 ENCOUNTER — Telehealth: Payer: Self-pay

## 2018-08-10 LAB — KAPPA/LAMBDA LIGHT CHAINS
Kappa free light chain: 3728 mg/L — ABNORMAL HIGH (ref 3.3–19.4)
Kappa, lambda light chain ratio: 258.89 — ABNORMAL HIGH (ref 0.26–1.65)
Lambda free light chains: 14.4 mg/L (ref 5.7–26.3)

## 2018-08-10 NOTE — Telephone Encounter (Signed)
Left a detailed msg of the  additional appointments that was added to current schedule. Mailed letter and calender of these new appointment. Per 12/6 los.

## 2018-08-12 ENCOUNTER — Ambulatory Visit: Payer: Medicare Other | Admitting: Family Medicine

## 2018-08-12 LAB — MULTIPLE MYELOMA PANEL, SERUM
ALBUMIN SERPL ELPH-MCNC: 3.9 g/dL (ref 2.9–4.4)
ALPHA 1: 0.2 g/dL (ref 0.0–0.4)
Albumin/Glob SerPl: 1.6 (ref 0.7–1.7)
Alpha2 Glob SerPl Elph-Mcnc: 0.7 g/dL (ref 0.4–1.0)
B-GLOBULIN SERPL ELPH-MCNC: 0.8 g/dL (ref 0.7–1.3)
GAMMA GLOB SERPL ELPH-MCNC: 0.9 g/dL (ref 0.4–1.8)
Globulin, Total: 2.6 g/dL (ref 2.2–3.9)
IGA: 88 mg/dL (ref 61–437)
IGG (IMMUNOGLOBIN G), SERUM: 1085 mg/dL (ref 700–1600)
IGM (IMMUNOGLOBULIN M), SRM: 34 mg/dL (ref 15–143)
TOTAL PROTEIN ELP: 6.5 g/dL (ref 6.0–8.5)

## 2018-08-14 ENCOUNTER — Inpatient Hospital Stay: Payer: Medicare Other

## 2018-08-19 ENCOUNTER — Ambulatory Visit (HOSPITAL_COMMUNITY)
Admission: RE | Admit: 2018-08-19 | Discharge: 2018-08-19 | Disposition: A | Discharge status: Home / Self Care | Payer: Medicare Other | Source: Ambulatory Visit | Attending: Hematology | Admitting: Hematology

## 2018-08-19 ENCOUNTER — Ambulatory Visit: Payer: Medicare Other | Admitting: Family Medicine

## 2018-08-19 DIAGNOSIS — C9000 Multiple myeloma not having achieved remission: Secondary | ICD-10-CM | POA: Diagnosis present

## 2018-08-19 LAB — GLUCOSE, CAPILLARY: Glucose-Capillary: 90 mg/dL (ref 70–99)

## 2018-08-19 MED ORDER — FLUDEOXYGLUCOSE F - 18 (FDG) INJECTION
11.2300 | Freq: Once | INTRAVENOUS | Status: AC | PRN
  Administered 2018-08-19: 11.23 via INTRAVENOUS

## 2018-08-20 ENCOUNTER — Telehealth: Payer: Self-pay | Admitting: *Deleted

## 2018-08-20 NOTE — Telephone Encounter (Signed)
"  [name] [name] with [facility] calling today with report of 08-19-2018 PET scan.   IMPRESSION: 1. No evidence of active multiple myeloma on FDG whole-body scan. 2. No evidence of lytic lesions on CT portion of exam. 3. No evidence plasmacytoma. 4. Large intensely hypermetabolic mass exophytic from the upper pole of the LEFT kidney is consistent with primary RENAL NEOPLASM. Recommend urology consultation. Please make provider aware of results."

## 2018-08-21 ENCOUNTER — Inpatient Hospital Stay: Payer: Medicare Other

## 2018-08-28 ENCOUNTER — Inpatient Hospital Stay: Payer: Medicare Other

## 2018-09-02 ENCOUNTER — Emergency Department (HOSPITAL_COMMUNITY)
Admission: EM | Admit: 2018-09-02 | Discharge: 2018-09-02 | Disposition: A | Discharge status: Home / Self Care | Payer: Medicare Other | Attending: Emergency Medicine | Admitting: Emergency Medicine

## 2018-09-02 ENCOUNTER — Other Ambulatory Visit: Payer: Self-pay

## 2018-09-02 ENCOUNTER — Emergency Department (HOSPITAL_COMMUNITY): Payer: Medicare Other

## 2018-09-02 ENCOUNTER — Encounter (HOSPITAL_COMMUNITY): Payer: Self-pay | Admitting: Emergency Medicine

## 2018-09-02 DIAGNOSIS — Z79899 Other long term (current) drug therapy: Secondary | ICD-10-CM | POA: Diagnosis not present

## 2018-09-02 DIAGNOSIS — J449 Chronic obstructive pulmonary disease, unspecified: Secondary | ICD-10-CM | POA: Insufficient documentation

## 2018-09-02 DIAGNOSIS — N189 Chronic kidney disease, unspecified: Secondary | ICD-10-CM | POA: Insufficient documentation

## 2018-09-02 DIAGNOSIS — R0602 Shortness of breath: Secondary | ICD-10-CM | POA: Diagnosis present

## 2018-09-02 DIAGNOSIS — I480 Paroxysmal atrial fibrillation: Secondary | ICD-10-CM | POA: Insufficient documentation

## 2018-09-02 DIAGNOSIS — I2510 Atherosclerotic heart disease of native coronary artery without angina pectoris: Secondary | ICD-10-CM | POA: Diagnosis not present

## 2018-09-02 DIAGNOSIS — J181 Lobar pneumonia, unspecified organism: Secondary | ICD-10-CM

## 2018-09-02 DIAGNOSIS — E1122 Type 2 diabetes mellitus with diabetic chronic kidney disease: Secondary | ICD-10-CM | POA: Diagnosis not present

## 2018-09-02 DIAGNOSIS — J189 Pneumonia, unspecified organism: Secondary | ICD-10-CM | POA: Diagnosis not present

## 2018-09-02 DIAGNOSIS — I130 Hypertensive heart and chronic kidney disease with heart failure and stage 1 through stage 4 chronic kidney disease, or unspecified chronic kidney disease: Secondary | ICD-10-CM | POA: Insufficient documentation

## 2018-09-02 DIAGNOSIS — I5032 Chronic diastolic (congestive) heart failure: Secondary | ICD-10-CM | POA: Diagnosis not present

## 2018-09-02 DIAGNOSIS — Z87891 Personal history of nicotine dependence: Secondary | ICD-10-CM | POA: Diagnosis not present

## 2018-09-02 MED ORDER — ALBUTEROL SULFATE (2.5 MG/3ML) 0.083% IN NEBU: 2.5000 mg | INHALATION_SOLUTION | RESPIRATORY_TRACT | 0 refills | Status: DC | PRN

## 2018-09-02 MED ORDER — ALBUTEROL SULFATE (2.5 MG/3ML) 0.083% IN NEBU
5.0000 mg | INHALATION_SOLUTION | Freq: Once | RESPIRATORY_TRACT | Status: AC
  Administered 2018-09-02: 5 mg via RESPIRATORY_TRACT
  Filled 2018-09-02: qty 6

## 2018-09-02 MED ORDER — ALBUTEROL SULFATE HFA 108 (90 BASE) MCG/ACT IN AERS
2.0000 | INHALATION_SPRAY | RESPIRATORY_TRACT | Status: DC | PRN
  Administered 2018-09-02: 2 via RESPIRATORY_TRACT
  Filled 2018-09-02: qty 6.7

## 2018-09-02 MED ORDER — LEVOFLOXACIN 750 MG PO TABS: 750.0000 mg | ORAL_TABLET | Freq: Every day | ORAL | 0 refills | Status: DC

## 2018-09-02 NOTE — ED Triage Notes (Signed)
Pt reports having shortness of breath with cough and drainage from right eye. Pt reporting yellow sputum.

## 2018-09-02 NOTE — Discharge Instructions (Addendum)
Levaquin as prescribed.  Albuterol nebulizer: 1 treatment every 4 hours as needed for wheezing.  Return to the emergency department if you develop chest pain, difficulty breathing, or other new and concerning symptoms.

## 2018-09-02 NOTE — ED Provider Notes (Signed)
[facility] DEPT Provider Note   CSN: [id number] Arrival date & time: 09/02/18  0216     History   Chief Complaint Chief Complaint  Patient presents with  . Shortness of Breath    HPI [name] [name] is a 73 y.o. male.  Patient is a 73 year old male with past medical history of paroxysmal A. fib, coronary artery disease, diabetes, and chronic renal insufficiency.  He presents today for evaluation of sinus congestion and productive cough.  This is worsened over the past several days.  He denies fevers or chills.  He denies any chest pain or leg swelling.  He has had similar symptoms in the past related to pneumonia.  The history is provided by the patient.  Cough  This is a new problem. Episode onset: Several days ago. The problem occurs constantly. The problem has been gradually worsening. The cough is productive of sputum. There has been no fever. Pertinent negatives include no chest pain and no shortness of breath. He has tried nothing for the symptoms.    Past Medical History:  Diagnosis Date  . Abnormal LFTs (liver function tests)   . Anxiety   . Aortic root dilatation ([place])    a. 12/2015 Echo: mod dil of sinus of valsalva and Ao root (27m).  b. 53 mm Echo 2018  . BPH with urinary obstruction   . Bradycardia   . 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 CAD (coronary artery disease)    a. 12/2015 NSTEMI/Cath (in setting of PAF):  LM nl, LAD 30p,  LCX 32mRCA  ok, AM 100%, RPDA1 40, RPDA 2 60 ->Med Rx.  . Childhood asthma   . Chronic diastolic CHF (congestive heart failure) ([facility]   a. 12/2015 Echo: EF 50-55%, mod AI, mod Ao root dil, mild MR, mod dil LA, mod RA.  . Marland KitchenOPD (chronic obstructive pulmonary disease) ([facility]   pt denies at preop  . Diabetes mellitus type II    diet controlled  . History of syncope   . Hyperlipidemia   . Hypertensive heart disease   .  Kidney lump 04/04/2010   Overview:  Renal Cell Carcinoma   . Moderate aortic insufficiency    a. 12/2015 Echo: Mod AI.  . Marland Kitchenaroxysmal atrial fibrillation ([facility]   a. 12/2015 started on Xarelto (CHA2DS2VASc = 4-5).  . Pneumonia   . Prostate cancer ([facility]  . Renal cell carcinoma ([facility]  . Sleep apnea    Does not like  CPAP  . Spinal stenosis in cervical region 10/12/2016    Patient Active Problem List   Diagnosis Date Noted  . Counseling regarding advance care planning and goals of care 06/22/2018  . AKI (acute kidney injury) ([facility]  . Anemia   . Sepsis (HCH[facility]10/12/2017  . Acute lower UTI 06/05/2018  . Colon polyp 05/21/2018  . Aortic valve regurgitation 04/14/2018  . Medication management 04/14/2018  . Thoracic aortic aneurysm without rupture (HCNew Hampton01/28/2019  . 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 Head trauma, subsequent encounter 10/12/2016  . 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 Spinal stenosis in cervical region 10/12/2016  . Cervical spondylosis 10/12/2016  . Syncope and collapse 10/12/2016  . Fall   . Syncope 10/11/2016  . CAD (coronary artery disease)   . Chronic diastolic CHF (  congestive heart failure) ([place])   . Hyperlipidemia   . Hypertensive heart disease   . Paroxysmal atrial fibrillation ([place]) 12/17/2015  . Multiple myeloma ([place]) 12/17/2015  . Mild intermittent asthma 10/18/2015  . Aortic root dilatation ([place]) 10/17/2015  . Diabetes ([place]) 10/17/2015  . 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 Hand paresthesia 12/09/2014  . Elevated prostate specific antigen (PSA) 07/12/2014  . Allergic rhinitis 07/12/2014  . LBP (low back pain) 07/12/2014  . Palpitation 07/11/2014  . Patient's other noncompliance with medication regimen 08/10/2013  . Chronic venous insufficiency 07/23/2012  . Obstructive apnea 04/11/2011  . Essential (primary)  hypertension 09/07/2010  . 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 Anxiety, generalized 04/04/2010  . Cardiac conduction disorder 02/27/2010  . Benign prostatic hyperplasia without urinary obstruction 09/13/2009  . Abnormal findings on examination of genitourinary organs 08/09/2009  . Hereditary and idiopathic neuropathy 07/05/2009    Past Surgical History:  Procedure Laterality Date  . BACK SURGERY    . CARDIAC CATHETERIZATION N/A 12/18/2015   Procedure: Left Heart Cath and Coronary Angiography;  Surgeon: Leonie Man, MD;  Location: [facility];  Service: Cardiovascular;  Laterality: N/A;  . DIAGNOSTIC LAPAROSCOPY     partial colectomy  . LAPAROSCOPIC PARTIAL COLECTOMY Right 05/21/2018   Procedure: LAPAROSCOPIC RIGHT  COLECTOMY ERAS PATHWAY;  Surgeon: Leighton Ruff, MD;  Location: [facility];  Service: General;  Laterality: Right;  . [place]   "replaced a disc"        Home Medications    Prior to Admission medications   Medication Sig Start Date End Date Taking? Authorizing Provider  acyclovir (ZOVIRAX) 400 MG tablet Take 1 tablet (400 mg total) by mouth 2 (two) times daily. 06/22/18   Brunetta Genera, MD  albuterol (PROVENTIL HFA;VENTOLIN HFA) 108 (90 Base) MCG/ACT inhaler Inhale 2 puffs into the lungs every 6 (six) hours as needed for wheezing or shortness of breath. 10/18/15   Burns, Arloa Koh, MD  albuterol (PROVENTIL) (5 MG/ML) 0.5% nebulizer solution Take 0.5 mLs (2.5 mg total) by nebulization every 6 (six) hours as needed for wheezing or shortness of breath. 10/18/15   Burns, Arloa Koh, MD  amLODipine (NORVASC) 10 MG tablet Take 10 mg by mouth 2 (two) times daily.     [provider]  atorvastatin (LIPITOR) 80 MG tablet Take 1 tablet (80 mg total) by mouth daily. Patient taking differently: Take 40 mg by mouth 2 (two) times daily.  04/14/18 07/13/18  Minus Breeding, MD  dexamethasone (DECADRON) 4 MG tablet Take 5 tablets (20  mg) on days 1, 8, 15, and 22 of chemo. Repeat every 28 days. 06/22/18   Brunetta Genera, MD  finasteride (PROSCAR) 5 MG tablet Take 1 tablet (5 mg total) by mouth daily. 06/17/18   Thurnell Lose, MD  hydrALAZINE (APRESOLINE) 50 MG tablet Take 1 tablet (50 mg total) by mouth every 8 (eight) hours. 06/17/18   Thurnell Lose, MD  ondansetron (ZOFRAN) 8 MG tablet Take 1 tablet (8 mg total) by mouth 2 (two) times daily as needed for refractory nausea / vomiting. Starting on days 4 and 11. 06/22/18   Brunetta Genera, MD  prochlorperazine (COMPAZINE) 10 MG tablet Take 1 tablet (10 mg total) by mouth every 6 (six) hours as needed (Nausea or vomiting). 06/22/18   Brunetta Genera, MD  tamsulosin (FLOMAX) 0.4 MG CAPS capsule Take 1 capsule (0.4 mg total) by mouth daily.  6/33/35   Leighton Ruff, MD    Family History Family History  Problem Relation Age of Onset  . Emphysema Father   . Asthma Father   . Liver disease Father        tumor  . Heart disease Mother   . Hypertension Mother   . Asthma Sister   . Hypertension Son     Social History Social History   Tobacco Use  . Smoking status: Former Smoker    Types: Cigars    Last attempt to quit: 09/02/1977    Years since quitting: 41.0  . Smokeless tobacco: Never Used  . Tobacco comment:    Substance Use Topics  . Alcohol use: Not Currently    Comment: "stopped drinking alcohol in ~ 1980; just drank a little on the weekends when I did drink"  . Drug use: No     Allergies   Amoxicillin and Penicillins   Review of Systems Review of Systems  Respiratory: Positive for cough. Negative for shortness of breath.   Cardiovascular: Negative for chest pain.  All other systems reviewed and are negative.    Physical Exam Updated Vital Signs BP (!) 145/90 (BP Location: Left Arm)   Pulse 79   Temp 98.6 F (37 C) (Oral)   Resp 20   Ht 5' 6" (1.676 m)   Wt 98 kg   SpO2 94%   BMI 34.86 kg/m   Physical Exam Vitals  signs and nursing note reviewed.  Constitutional:      General: He is not in acute distress.    Appearance: He is well-developed. He is not diaphoretic.  HENT:     Head: Normocephalic and atraumatic.  Neck:     Musculoskeletal: Normal range of motion and neck supple.  Cardiovascular:     Rate and Rhythm: Normal rate and regular rhythm.     Heart sounds: No murmur. No friction rub.  Pulmonary:     Effort: Pulmonary effort is normal. No respiratory distress.     Breath sounds: Normal breath sounds. No wheezing or rales.  Abdominal:     General: Bowel sounds are normal. There is no distension.     Palpations: Abdomen is soft.     Tenderness: There is no abdominal tenderness.  Musculoskeletal: Normal range of motion.     Right lower leg: Edema present.     Left lower leg: Edema present.     Comments: There is 1-2+ pitting edema of both lower extremities.  Skin:    General: Skin is warm and dry.  Neurological:     Mental Status: He is alert and oriented to person, place, and time.     Coordination: Coordination normal.      ED Treatments / Results  Labs (all labs ordered are listed, but only abnormal results are displayed) Labs Reviewed - No data to display  EKG None  Radiology Dg Chest 2 View  Result Date: 09/02/2018 CLINICAL DATA:  Acute onset of shortness of breath, cough and right eye drainage. EXAM: CHEST - 2 VIEW COMPARISON:  Chest radiograph performed 06/05/2018, and CTA of the chest performed 05/19/2018 FINDINGS: The lungs are well-aerated. Mild right basilar airspace opacity is compatible with pneumonia. There is no evidence of pleural effusion or pneumothorax. The heart is borderline enlarged. Right paratracheal density reflects normal vasculature on prior CTA. No acute osseous abnormalities are seen. IMPRESSION: 1. Mild right basilar pneumonia noted. 2. Borderline cardiomegaly. Electronically Signed   By: Francoise Schaumann.D.  On: 09/02/2018 03:45     Procedures Procedures (including critical care time)  Medications Ordered in ED Medications  albuterol (PROVENTIL) (2.5 MG/3ML) 0.083% nebulizer solution 5 mg (5 mg Nebulization Given 09/02/18 0257)     Initial Impression / Assessment and Plan / ED Course  I have reviewed the triage vital signs and the nursing notes.  Pertinent labs & imaging results that were available during my care of the patient were reviewed by me and considered in my medical decision making (see chart for details).  Patient's x-ray shows mild right basilar pneumonia.  He does have a cough productive of purulent sputum during exam.  He is in no respiratory distress and oxygen saturations are 100%.  His EKG shows no changes.  Due to the patient's extensive past medical history, I had plan to obtain laboratory studies, however the patient does not feel this to be necessary.  He tells me he is leaving regardless to attend a bowling party for his grandson which is occurring this afternoon.  He will be discharged with Levaquin, and albuterol inhaler, and a prescription for albuterol nebulizer solution.  Final Clinical Impressions(s) / ED Diagnoses   Final diagnoses:  None    ED Discharge Orders    None       Veryl Speak, MD 09/02/18 [phone]

## 2018-09-03 ENCOUNTER — Inpatient Hospital Stay: Payer: Medicare Other

## 2018-09-03 ENCOUNTER — Inpatient Hospital Stay: Payer: Medicare Other | Attending: Hematology | Admitting: Hematology

## 2018-09-03 DIAGNOSIS — I2510 Atherosclerotic heart disease of native coronary artery without angina pectoris: Secondary | ICD-10-CM | POA: Insufficient documentation

## 2018-09-03 DIAGNOSIS — E785 Hyperlipidemia, unspecified: Secondary | ICD-10-CM | POA: Insufficient documentation

## 2018-09-03 DIAGNOSIS — C9000 Multiple myeloma not having achieved remission: Secondary | ICD-10-CM | POA: Insufficient documentation

## 2018-09-03 DIAGNOSIS — Z79899 Other long term (current) drug therapy: Secondary | ICD-10-CM | POA: Insufficient documentation

## 2018-09-03 DIAGNOSIS — I480 Paroxysmal atrial fibrillation: Secondary | ICD-10-CM | POA: Insufficient documentation

## 2018-09-03 DIAGNOSIS — E119 Type 2 diabetes mellitus without complications: Secondary | ICD-10-CM | POA: Insufficient documentation

## 2018-09-03 DIAGNOSIS — G4730 Sleep apnea, unspecified: Secondary | ICD-10-CM | POA: Insufficient documentation

## 2018-09-03 DIAGNOSIS — N400 Enlarged prostate without lower urinary tract symptoms: Secondary | ICD-10-CM | POA: Insufficient documentation

## 2018-09-03 DIAGNOSIS — R945 Abnormal results of liver function studies: Secondary | ICD-10-CM | POA: Insufficient documentation

## 2018-09-03 DIAGNOSIS — R000 Tachycardia, unspecified: Secondary | ICD-10-CM | POA: Insufficient documentation

## 2018-09-03 DIAGNOSIS — I5032 Chronic diastolic (congestive) heart failure: Secondary | ICD-10-CM | POA: Insufficient documentation

## 2018-09-03 DIAGNOSIS — Z5111 Encounter for antineoplastic chemotherapy: Secondary | ICD-10-CM | POA: Insufficient documentation

## 2018-09-03 DIAGNOSIS — Z87891 Personal history of nicotine dependence: Secondary | ICD-10-CM | POA: Insufficient documentation

## 2018-09-03 DIAGNOSIS — J449 Chronic obstructive pulmonary disease, unspecified: Secondary | ICD-10-CM | POA: Insufficient documentation

## 2018-09-03 DIAGNOSIS — F419 Anxiety disorder, unspecified: Secondary | ICD-10-CM | POA: Insufficient documentation

## 2018-09-03 DIAGNOSIS — I110 Hypertensive heart disease with heart failure: Secondary | ICD-10-CM | POA: Insufficient documentation

## 2018-09-03 DIAGNOSIS — Z9221 Personal history of antineoplastic chemotherapy: Secondary | ICD-10-CM | POA: Insufficient documentation

## 2018-09-04 ENCOUNTER — Other Ambulatory Visit: Payer: Medicare Other

## 2018-09-04 ENCOUNTER — Ambulatory Visit: Payer: Medicare Other

## 2018-09-07 ENCOUNTER — Ambulatory Visit (INDEPENDENT_AMBULATORY_CARE_PROVIDER_SITE_OTHER): Payer: Medicare Other | Admitting: Family Medicine

## 2018-09-07 ENCOUNTER — Encounter: Payer: Self-pay | Admitting: Family Medicine

## 2018-09-07 VITALS — BP 124/85 | HR 78 | Temp 97.8°F | Resp 17 | Ht 66.0 in | Wt 219.8 lb

## 2018-09-07 DIAGNOSIS — Z794 Long term (current) use of insulin: Secondary | ICD-10-CM | POA: Diagnosis not present

## 2018-09-07 DIAGNOSIS — E119 Type 2 diabetes mellitus without complications: Secondary | ICD-10-CM

## 2018-09-07 DIAGNOSIS — R5383 Other fatigue: Secondary | ICD-10-CM

## 2018-09-07 DIAGNOSIS — R42 Dizziness and giddiness: Secondary | ICD-10-CM | POA: Diagnosis not present

## 2018-09-07 DIAGNOSIS — Z7689 Persons encountering health services in other specified circumstances: Secondary | ICD-10-CM

## 2018-09-07 DIAGNOSIS — E1169 Type 2 diabetes mellitus with other specified complication: Secondary | ICD-10-CM

## 2018-09-07 MED ORDER — ATORVASTATIN CALCIUM 80 MG PO TABS: 80.0000 mg | ORAL_TABLET | Freq: Every day | ORAL | 3 refills | Status: DC

## 2018-09-07 MED ORDER — AMLODIPINE BESYLATE 10 MG PO TABS: 10.0000 mg | ORAL_TABLET | Freq: Every day | ORAL | 1 refills | Status: DC

## 2018-09-07 MED ORDER — BLOOD GLUCOSE MONITOR KIT: PACK | 0 refills | Status: DC

## 2018-09-07 NOTE — Patient Instructions (Addendum)
Please contact [facility] at [phone] to schedule an appointment recommended by oncology.  Thank you for choosing Primary Care at [facility] to be your medical home!    [name] [name] was seen by Molli Barrows, FNP today.   Zurich A Lanzo's primary care provider is Scot Jun, FNP.   For the best care possible, you should try to see Molli Barrows, FNP-C whenever you come to the clinic.   We look forward to seeing you again soon!  If you have any questions about your visit today, please call us at [phone] or feel free to reach your primary care provider via [place].     Diabetes Mellitus and Nutrition, Adult When you have diabetes (diabetes mellitus), it is very important to have healthy eating habits because your blood sugar (glucose) levels are greatly affected by what you eat and drink. Eating healthy foods in the appropriate amounts, at about the same times every day, can help you:  Control your blood glucose.  Lower your risk of heart disease.  Improve your blood pressure.  Reach or maintain a healthy weight. Every person with diabetes is different, and each person has different needs for a meal plan. Your health care provider may recommend that you work with a diet and nutrition specialist (dietitian) to make a meal plan that is best for you. Your meal plan may vary depending on factors such as:  The calories you need.  The medicines you take.  Your weight.  Your blood glucose, blood pressure, and cholesterol levels.  Your activity level.  Other health conditions you have, such as heart or kidney disease. How do carbohydrates affect me? Carbohydrates, also called carbs, affect your blood glucose level more than any other type of food. Eating carbs naturally raises the amount of glucose in your blood. Carb counting is a method for keeping track of how many carbs you eat. Counting carbs is important to keep your blood glucose at a healthy level,  especially if you use insulin or take certain oral diabetes medicines. It is important to know how many carbs you can safely have in each meal. This is different for every person. Your dietitian can help you calculate how many carbs you should have at each meal and for each snack. Foods that contain carbs include:  Bread, cereal, rice, pasta, and crackers.  Potatoes and corn.  Peas, beans, and lentils.  Milk and yogurt.  Fruit and juice.  Desserts, such as cakes, cookies, ice cream, and candy. How does alcohol affect me? Alcohol can cause a sudden decrease in blood glucose (hypoglycemia), especially if you use insulin or take certain oral diabetes medicines. Hypoglycemia can be a life-threatening condition. Symptoms of hypoglycemia (sleepiness, dizziness, and confusion) are similar to symptoms of having too much alcohol. If your health care provider says that alcohol is safe for you, follow these guidelines:  Limit alcohol intake to no more than 1 drink per day for nonpregnant women and 2 drinks per day for men. One drink equals 12 oz of beer, 5 oz of wine, or 1 oz of hard liquor.  Do not drink on an empty stomach.  Keep yourself hydrated with water, diet soda, or unsweetened iced tea.  Keep in mind that regular soda, juice, and other mixers may contain a lot of sugar and must be counted as carbs. What are tips for following this plan?  Reading food labels  Start by checking the serving size on the "Nutrition Facts" label of  packaged foods and drinks. The amount of calories, carbs, fats, and other nutrients listed on the label is based on one serving of the item. Many items contain more than one serving per package.  Check the total grams (g) of carbs in one serving. You can calculate the number of servings of carbs in one serving by dividing the total carbs by 15. For example, if a food has 30 g of total carbs, it would be equal to 2 servings of carbs.  Check the number of grams  (g) of saturated and trans fats in one serving. Choose foods that have low or no amount of these fats.  Check the number of milligrams (mg) of salt (sodium) in one serving. Most people should limit total sodium intake to less than 2,300 mg per day.  Always check the nutrition information of foods labeled as "low-fat" or "nonfat". These foods may be higher in added sugar or refined carbs and should be avoided.  Talk to your dietitian to identify your daily goals for nutrients listed on the label. Shopping  Avoid buying canned, premade, or processed foods. These foods tend to be high in fat, sodium, and added sugar.  Shop around the outside edge of the grocery store. This includes fresh fruits and vegetables, bulk grains, fresh meats, and fresh dairy. Cooking  Use low-heat cooking methods, such as baking, instead of high-heat cooking methods like deep frying.  Cook using healthy oils, such as olive, canola, or sunflower oil.  Avoid cooking with butter, cream, or high-fat meats. Meal planning  Eat meals and snacks regularly, preferably at the same times every day. Avoid going long periods of time without eating.  Eat foods high in fiber, such as fresh fruits, vegetables, beans, and whole grains. Talk to your dietitian about how many servings of carbs you can eat at each meal.  Eat 4-6 ounces (oz) of lean protein each day, such as lean meat, chicken, fish, eggs, or tofu. One oz of lean protein is equal to: ? 1 oz of meat, chicken, or fish. ? 1 egg. ?  cup of tofu.  Eat some foods each day that contain healthy fats, such as avocado, nuts, seeds, and fish. Lifestyle  Check your blood glucose regularly.  Exercise regularly as told by your health care provider. This may include: ? 150 minutes of moderate-intensity or vigorous-intensity exercise each week. This could be brisk walking, biking, or water aerobics. ? Stretching and doing strength exercises, such as yoga or weightlifting, at  least 2 times a week.  Take medicines as told by your health care provider.  Do not use any products that contain nicotine or tobacco, such as cigarettes and e-cigarettes. If you need help quitting, ask your health care provider.  Work with a Social worker or diabetes educator to identify strategies to manage stress and any emotional and social challenges. Questions to ask a health care provider  Do I need to meet with a diabetes educator?  Do I need to meet with a dietitian?  What number can I call if I have questions?  When are the best times to check my blood glucose? Where to find more information:  American Diabetes Association: diabetes.org  Academy of Nutrition and Dietetics: www.eatright.[employer] of Diabetes and Digestive and Kidney Diseases (NIH): [employer] Summary  A healthy meal plan will help you control your blood glucose and maintain a healthy lifestyle.  Working with a diet and nutrition specialist (dietitian) can help you make a meal  plan that is best for you.  Keep in mind that carbohydrates (carbs) and alcohol have immediate effects on your blood glucose levels. It is important to count carbs and to use alcohol carefully. This information is not intended to replace advice given to you by your health care provider. Make sure you discuss any questions you have with your health care provider. Document Released: 05/16/2005 Document Revised: 03/19/2017 Document Reviewed: 09/23/2016 Elsevier Interactive Patient Education  2019 [employer].

## 2018-09-07 NOTE — Progress Notes (Signed)
Daaiel Starlin, is a 73 y.o. male  DGU:[id number]  ZDG:[id number]  DOB - June 21, 1946  CC:  Chief Complaint  Patient presents with  . Establish Care       HPI: Amyr is a 73 y.o. male is here today to establish care and requests evaluation of diabetes.   Decklyn A Hulce has Palpitation; Elevated prostate specific antigen (PSA); Allergic rhinitis; Chronic venous insufficiency; Anxiety, generalized; ED (erectile dysfunction) of organic origin; MGUS (monoclonal gammopathy of unknown significance); LBP (low back pain); Abnormal findings on examination of genitourinary organs; Obstructive apnea; Cardiac conduction disorder; Hand paresthesia; Patient's other noncompliance with medication regimen; Essential (primary) hypertension; Hereditary and idiopathic neuropathy; Benign prostatic hyperplasia without urinary obstruction; Aortic root dilatation ([place]); Diabetes ([place]); Mild intermittent asthma; Paroxysmal atrial fibrillation ([place]); Multiple myeloma ([place]); CAD (coronary artery disease); Chronic diastolic CHF (congestive heart failure) ([place]); Hyperlipidemia; Hypertensive heart disease; Syncope; 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   Patient is actively bing treated for multiple myeloma with metastatic disease.  He is currently being followed at the cancer center under the care of Dr. Irene Limbo.  Patient is a poor historian and requires multiple attempts at redirection. He admits to very poor memory and relies on the assistance of  family to help with organization of appointments. He is concerned about diabetes as he is currently not taking any medication for treatment. He doesn't have a glucometer at home. In review of his most current labs shows that he is suffering from chronic kidney failure with an elevated creatinine level  this is likely due to recent finding of a renal mass of left kidney. He was recently diagnosed with CAP and treated with  Levaquin which he is continues to take. Denies shortness of breath, fever, chest pain, chills, cough, or fatigue. Feels symptoms of pneumonia have improved.  Current medications: Current Outpatient Medications:  .  albuterol (PROVENTIL) (2.5 MG/3ML) 0.083% nebulizer solution, Take 3 mLs (2.5 mg total) by nebulization every 4 (four) hours as needed for wheezing or shortness of breath., Disp: 30 vial, Rfl: 0 .  amLODipine (NORVASC) 10 MG tablet, Take 10 mg by mouth 2 (two) times daily. , Disp: , Rfl:  .  cetirizine (ZYRTEC) 10 MG tablet, Take 10 mg by mouth daily., Disp: , Rfl:  .  fluticasone (FLONASE) 50 MCG/ACT nasal spray, Place 2 sprays into both nostrils daily., Disp: , Rfl:  .  levofloxacin (LEVAQUIN) 750 MG tablet, Take 1 tablet (750 mg total) by mouth daily. X 7 days, Disp: 7 tablet, Rfl: 0 .  potassium chloride SA (K-DUR,KLOR-CON) 20 MEQ tablet, Take 20 mEq by mouth 2 (two) times daily., Disp: , Rfl:  .  tamsulosin (FLOMAX) 0.4 MG CAPS capsule, Take 1 capsule (0.4 mg total) by mouth daily., Disp: 30 capsule, Rfl: 0   Pertinent family medical history: family history includes Asthma in his father and sister; Emphysema in his father; Heart disease in his mother; Hypertension in his mother and son; Liver disease in his father.     Social History   Socioeconomic History  . Marital status: Widowed    Spouse  name: Not on file  . Number of children: Not on file  . Years of education: Not on file  . Highest education level: Not on file  Occupational History  . Not on file   Social Needs  . Financial resource strain: Not on file  . Food insecurity:    Worry: Not on file    Inability: Not on file  . Transportation needs:    Medical: Not on file    Non-medical: Not on file  Tobacco Use  . Smoking status: Former Smoker    Types: Cigars    Last attempt to quit: 09/02/1977    Years since quitting: 41.0  . Smokeless tobacco: Never Used  . Tobacco comment:    Substance and Sexual Activity  . Alcohol use: Not Currently    Comment: "stopped drinking alcohol in ~ 1980; just drank a little on the weekends when I did drink"  . Drug use: No  . Sexual activity: Not Currently  Lifestyle  . Physical activity:    Days per week: Not on file    Minutes per session: Not on file  . Stress: Not on file  Relationships  . Social connections:    Talks on phone: Not on file    Gets together: Not on file    Attends religious service: Not on file    Active member of club or organization: Not on file    Attends meetings of clubs or organizations: Not on file    Relationship status: Not on file  . Intimate partner violence:    Fear of current or ex partner: Not on file    Emotionally abused: Not on file    Physically abused: Not on file    Forced sexual activity: Not on file  Other Topics Concern  . Not on file  Social History Narrative   Lives alone.  Wife died in Apr 03, 2023.  Lives in apartment.      Review of Systems: Pertinent negatives listed in HPI Objective:   Vitals:   09/07/18 1349  BP: 124/85  Pulse: 78  Resp: 17  Temp: 97.8 F (36.6 C)  SpO2: 92%    BP Readings from Last 3 Encounters:  09/07/18 124/85  09/02/18 138/85  08/08/18 (!) 174/99    Filed Weights   09/07/18 1349  Weight: 219 lb 12.8 oz (99.7 kg)      Physical Exam: General appearance: alert, well developed, well nourished, cooperative and in no distress Head: Normocephalic, without obvious abnormality, atraumatic Respiratory: Respirations even and unlabored, normal respiratory  rate Heart: RRR normal, no murmurs or gallop rhythms auscultated  Extremities: No gross deformities. Gait normal. Skin: Skin color, texture, turgor normal. No rashes seen  Psych: Appropriate mood and affect. Neurologic: Mental status: Alert, oriented to person, place, and time, thought content appropriate.  Lab Results (prior encounters)  Lab Results  Component Value Date   WBC 4.9 08/07/2018   HGB 8.4 (L) 08/07/2018   HCT 25.7 (L) 08/07/2018   MCV 95.9 08/07/2018   PLT 195 08/07/2018   Lab Results  Component Value Date   CREATININE 2.10 (H) 08/07/2018   BUN 29 (H) 08/07/2018   NA 142 08/07/2018   K 3.4 (L) 08/07/2018   CL 104 08/07/2018   CO2 26 08/07/2018    Lab Results  Component Value Date   HGBA1C 5.2 05/13/2018       Component Value Date/Time   CHOL 199 09/30/2017 1042   TRIG 109 09/30/2017 1042  HDL 56 09/30/2017 1042   CHOLHDL 3.6 09/30/2017 1042   CHOLHDL 3.3 10/11/2016 1900   VLDL 27 10/11/2016 1900   LDLCALC 121 (H) 09/30/2017 1042        Assessment and plan:  1. Encounter to establish care  2. Type 2 diabetes mellitus with other specified complication, with long-term current use of insulin ([place]), last A1c 5.2 in September, well outside of diabetes range.  Repeating Hemoglobin A1c.today. No medication warranted today. Continue routine daily walks as tolerated, drink water, and limit white -sugary type foods   Return in about 4 months (around 01/06/2019).   The patient was given clear instructions to go to ER or return to medical center if symptoms don't improve, worsen or new problems develop. The patient verbalized understanding. The patient was advised  to call and obtain lab results if they haven't heard anything from out office within 7-10 business days.   Molli Barrows, FNP [facility] at [facility] [address] [place] [address] [phone]: [phone]   This note has been created with Dragon speech  recognition software and s[employer]er, materials Any transcriptional errors are unintentional.

## 2018-09-08 LAB — HEMOGLOBIN A1C
Est. average glucose Bld gHb Est-mCnc: 108 mg/dL
Hgb A1c MFr Bld: 5.4 % (ref 4.8–5.6)

## 2018-09-09 ENCOUNTER — Encounter (HOSPITAL_COMMUNITY): Payer: Self-pay | Admitting: Emergency Medicine

## 2018-09-09 ENCOUNTER — Other Ambulatory Visit: Payer: Self-pay

## 2018-09-09 ENCOUNTER — Encounter: Payer: Self-pay | Admitting: Family Medicine

## 2018-09-09 ENCOUNTER — Emergency Department (HOSPITAL_COMMUNITY): Payer: Medicare Other

## 2018-09-09 ENCOUNTER — Emergency Department (HOSPITAL_COMMUNITY)
Admission: EM | Admit: 2018-09-09 | Discharge: 2018-09-09 | Disposition: A | Discharge status: Home / Self Care | Payer: Medicare Other | Attending: Emergency Medicine | Admitting: Emergency Medicine

## 2018-09-09 DIAGNOSIS — R05 Cough: Secondary | ICD-10-CM | POA: Insufficient documentation

## 2018-09-09 DIAGNOSIS — I10 Essential (primary) hypertension: Secondary | ICD-10-CM | POA: Insufficient documentation

## 2018-09-09 DIAGNOSIS — I5032 Chronic diastolic (congestive) heart failure: Secondary | ICD-10-CM | POA: Insufficient documentation

## 2018-09-09 DIAGNOSIS — E119 Type 2 diabetes mellitus without complications: Secondary | ICD-10-CM | POA: Insufficient documentation

## 2018-09-09 DIAGNOSIS — F17290 Nicotine dependence, other tobacco product, uncomplicated: Secondary | ICD-10-CM | POA: Insufficient documentation

## 2018-09-09 DIAGNOSIS — I252 Old myocardial infarction: Secondary | ICD-10-CM | POA: Insufficient documentation

## 2018-09-09 DIAGNOSIS — R600 Localized edema: Secondary | ICD-10-CM | POA: Diagnosis not present

## 2018-09-09 DIAGNOSIS — J329 Chronic sinusitis, unspecified: Secondary | ICD-10-CM | POA: Diagnosis not present

## 2018-09-09 DIAGNOSIS — Z79899 Other long term (current) drug therapy: Secondary | ICD-10-CM | POA: Diagnosis not present

## 2018-09-09 DIAGNOSIS — R0602 Shortness of breath: Secondary | ICD-10-CM | POA: Diagnosis present

## 2018-09-09 LAB — CBC WITH DIFFERENTIAL/PLATELET
Abs Immature Granulocytes: 0.03 10*3/uL (ref 0.00–0.07)
Basophils Absolute: 0 10*3/uL (ref 0.0–0.1)
Basophils Relative: 1 %
Eosinophils Absolute: 0.1 10*3/uL (ref 0.0–0.5)
Eosinophils Relative: 1 %
HCT: 29.6 % — ABNORMAL LOW (ref 39.0–52.0)
Hemoglobin: 9.2 g/dL — ABNORMAL LOW (ref 13.0–17.0)
Immature Granulocytes: 1 %
Lymphocytes Relative: 24 %
Lymphs Abs: 1.5 10*3/uL (ref 0.7–4.0)
MCH: 30.4 pg (ref 26.0–34.0)
MCHC: 31.1 g/dL (ref 30.0–36.0)
MCV: 97.7 fL (ref 80.0–100.0)
Monocytes Absolute: 0.7 10*3/uL (ref 0.1–1.0)
Monocytes Relative: 11 %
Neutro Abs: 4.1 10*3/uL (ref 1.7–7.7)
Neutrophils Relative %: 62 %
Platelets: 215 10*3/uL (ref 150–400)
RBC: 3.03 MIL/uL — ABNORMAL LOW (ref 4.22–5.81)
RDW: 14.7 % (ref 11.5–15.5)
WBC: 6.4 10*3/uL (ref 4.0–10.5)
nRBC: 0 % (ref 0.0–0.2)

## 2018-09-09 LAB — BRAIN NATRIURETIC PEPTIDE: B Natriuretic Peptide: 36.2 pg/mL (ref 0.0–100.0)

## 2018-09-09 LAB — BASIC METABOLIC PANEL
Anion gap: 10 (ref 5–15)
BUN: 34 mg/dL — ABNORMAL HIGH (ref 8–23)
CO2: 24 mmol/L (ref 22–32)
Calcium: 8.9 mg/dL (ref 8.9–10.3)
Chloride: 103 mmol/L (ref 98–111)
Creatinine, Ser: 2.38 mg/dL — ABNORMAL HIGH (ref 0.61–1.24)
GFR calc Af Amer: 30 mL/min — ABNORMAL LOW (ref >60)
GFR calc non Af Amer: 26 mL/min — ABNORMAL LOW (ref >60)
Glucose, Bld: 94 mg/dL (ref 70–99)
Potassium: 3.5 mmol/L (ref 3.5–5.1)
Sodium: 137 mmol/L (ref 135–145)

## 2018-09-09 MED ORDER — DEXAMETHASONE 4 MG PO TABS
8.0000 mg | ORAL_TABLET | Freq: Once | ORAL | Status: AC
  Administered 2018-09-09: 8 mg via ORAL
  Filled 2018-09-09: qty 2

## 2018-09-09 MED ORDER — OXYMETAZOLINE HCL 0.05 % NA SOLN
1.0000 | Freq: Two times a day (BID) | NASAL | Status: DC | PRN
  Administered 2018-09-09: 1 via NASAL
  Filled 2018-09-09: qty 15

## 2018-09-09 NOTE — Progress Notes (Signed)
Mail letter. 

## 2018-09-09 NOTE — ED Triage Notes (Signed)
Pt c/o shortness of breath and nasal congestion x "a while", denies chest pain.

## 2018-09-09 NOTE — ED Provider Notes (Signed)
[facility] EMERGENCY DEPARTMENT Provider Note   CSN: [id number] Arrival date & time: 09/09/18  1544     History   Chief Complaint Chief Complaint  Patient presents with  . Shortness of Breath    HPI [name] [name] is a 73 y.o. male.  HPI   73 year old male with cough and facial pressure/congestion.  He was to see in the emergency room recently and diagnosed with pneumonia.  He was started on Levaquin.  He states that his breathing has improved since then.  He is concerned about the persistent cough though when the drainage.  He says is keeping him up at night and cannot sleep.  No fevers.  He has been using albuterol with some mild improvement.  Past Medical History:  Diagnosis Date  . Abnormal LFTs (liver function tests)   . Anxiety   . Aortic root dilatation ([place])    a. 12/2015 Echo: mod dil of sinus of valsalva and Ao root (31m).  b. 53 mm Echo 2018  . BPH with urinary obstruction   . Bradycardia   . 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 CAD (coronary artery disease)    a. 12/2015 NSTEMI/Cath (in setting of PAF):  LM nl, LAD 30p,  LCX 334mRCA  ok, AM 100%, RPDA1 40, RPDA 2 60 ->Med Rx.  . Childhood asthma   . Chronic diastolic CHF (congestive heart failure) ([place]CM[place]   a. 12/2015 Echo: EF 50-55%, mod AI, mod Ao root dil, mild MR, mod dil LA, mod RA.  . Marland KitchenOPD (chronic obstructive pulmonary disease) (HCHaught[place]   pt denies at preop  . Diabetes mellitus type II    diet controlled  . History of syncope   . Hyperlipidemia   . Hypertensive heart disease   . Kidney lump 04/04/2010   Overview:  Renal Cell Carcinoma   . Moderate aortic insufficiency    a. 12/2015 Echo: Mod AI.  . Marland Kitchenaroxysmal atrial fibrillation ([place]CM[place]   a. 12/2015 started on Xarelto (CHA2DS2VASc = 4-5).  . Pneumonia   . Prostate cancer ([place]CHarlem [place]  . Renal cell carcinoma ([facility]  . Sleep apnea    Does not like  CPAP  .  Spinal stenosis in cervical region 10/12/2016    Patient Active Problem List   Diagnosis Date Noted  . Counseling regarding advance care planning and goals of care 06/22/2018  . AKI (acute kidney injury) (HCG[place]  . Anemia   . Sepsis (HCE[place]10/12/2017  . Acute lower UTI 06/05/2018  . Colon polyp 05/21/2018  . Aortic valve regurgitation 04/14/2018  . Medication management 04/14/2018  . Thoracic aortic aneurysm without rupture (HCWhite Water01/28/2019  . 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 Head trauma, subsequent encounter 10/12/2016  . 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 Spinal stenosis in cervical region 10/12/2016  . Cervical spondylosis 10/12/2016  . Syncope and collapse 10/12/2016  . Fall   . Syncope 10/11/2016  . CAD (coronary artery disease)   . Chronic diastolic CHF (congestive heart failure) (HCCoy  . Hyperlipidemia   . Hypertensive heart disease   . Paroxysmal atrial fibrillation (HCGage04/16/2017  . Multiple myeloma (HCHolualoa04/16/2017  . Mild intermittent asthma 10/18/2015  . Aortic root dilatation (HCJamul02/14/2017  . Diabetes (HCShippensburg02/14/2017  . MGUS (monoclonal gammopathy of unknown  significance) 01/17/2015  . Hand paresthesia 12/09/2014  . Elevated prostate specific antigen (PSA) 07/12/2014  . Allergic rhinitis 07/12/2014  . LBP (low back pain) 07/12/2014  . Palpitation 07/11/2014  . Patient's other noncompliance with medication regimen 08/10/2013  . Chronic venous insufficiency 07/23/2012  . Obstructive apnea 04/11/2011  . Essential (primary) hypertension 09/07/2010  . 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 Anxiety, generalized 04/04/2010  . Cardiac conduction disorder 02/27/2010  . Benign prostatic hyperplasia without urinary obstruction 09/13/2009  . Abnormal findings on examination of genitourinary organs 08/09/2009  . Hereditary and idiopathic  neuropathy 07/05/2009    Past Surgical History:  Procedure Laterality Date  . BACK SURGERY    . CARDIAC CATHETERIZATION N/A 12/18/2015   Procedure: Left Heart Cath and Coronary Angiography;  Surgeon: Leonie Man, MD;  Location: [facility];  Service: Cardiovascular;  Laterality: N/A;  . DIAGNOSTIC LAPAROSCOPY     partial colectomy  . LAPAROSCOPIC PARTIAL COLECTOMY Right 05/21/2018   Procedure: LAPAROSCOPIC RIGHT  COLECTOMY ERAS PATHWAY;  Surgeon: Leighton Ruff, MD;  Location: [facility];  Service: General;  Laterality: Right;  . [place]   "replaced a disc"        Home Medications    Prior to Admission medications   Medication Sig Start Date End Date Taking? Authorizing Provider  albuterol (PROVENTIL) (2.5 MG/3ML) 0.083% nebulizer solution Take 3 mLs (2.5 mg total) by nebulization every 4 (four) hours as needed for wheezing or shortness of breath. 09/02/18   Veryl Speak, MD  amLODipine (NORVASC) 10 MG tablet Take 1 tablet (10 mg total) by mouth daily. 09/07/18   Scot Jun, FNP  atorvastatin (LIPITOR) 80 MG tablet Take 1 tablet (80 mg total) by mouth daily. 09/07/18 12/06/18  Scot Jun, FNP  blood glucose meter kit and supplies KIT Dispense based on patient and insurance preference. Use up to four times daily as directed. (FOR ICD-10 E11.69). 09/07/18   Scot Jun, FNP  cetirizine (ZYRTEC) 10 MG tablet Take 10 mg by mouth daily.    [provider]  fluticasone (FLONASE) 50 MCG/ACT nasal spray Place 2 sprays into both nostrils daily. 08/18/18   [provider]  levofloxacin (LEVAQUIN) 750 MG tablet Take 1 tablet (750 mg total) by mouth daily. X 7 days 09/02/18   Veryl Speak, MD  potassium chloride SA (K-DUR,KLOR-CON) 20 MEQ tablet Take 20 mEq by mouth 2 (two) times daily.    [provider]  tamsulosin (FLOMAX) 0.4 MG CAPS capsule Take 1 capsule (0.4 mg total) by mouth daily. 1/66/06   Leighton Ruff, MD    Family  History Family History  Problem Relation Age of Onset  . Emphysema Father   . Asthma Father   . Liver disease Father        tumor  . Heart disease Mother   . Hypertension Mother   . Asthma Sister   . Hypertension Son     Social History Social History   Tobacco Use  . Smoking status: Former Smoker    Types: Cigars    Last attempt to quit: 09/02/1977    Years since quitting: 41.0  . Smokeless tobacco: Never Used  . Tobacco comment:    Substance Use Topics  . Alcohol use: Not Currently    Comment: "stopped drinking alcohol in ~ 1980; just drank a little on the weekends when I did drink"  . Drug use: No     Allergies  Amoxicillin and Penicillins   Review of Systems Review of Systems  All systems reviewed and negative, other than as noted in HPI.  Physical Exam Updated Vital Signs BP 113/63   Pulse 90   Temp 97.8 F (36.6 C)   Resp (!) 21   SpO2 100%   Physical Exam Vitals signs and nursing note reviewed.  Constitutional:      General: He is not in acute distress.    Appearance: He is well-developed.  HENT:     Head: Normocephalic and atraumatic.  Eyes:     General:        Right eye: No discharge.        Left eye: No discharge.     Conjunctiva/sclera: Conjunctivae normal.  Neck:     Musculoskeletal: Neck supple.  Cardiovascular:     Rate and Rhythm: Normal rate and regular rhythm.     Heart sounds: Normal heart sounds. No murmur. No friction rub. No gallop.   Pulmonary:     Effort: Pulmonary effort is normal. No respiratory distress.     Breath sounds: Normal breath sounds.  Abdominal:     General: There is no distension.     Palpations: Abdomen is soft.     Tenderness: There is no abdominal tenderness.  Musculoskeletal:        General: No tenderness.     Right lower leg: He exhibits no tenderness. Edema present.     Left lower leg: He exhibits no tenderness. Edema present.  Skin:    General: Skin is warm and dry.  Neurological:     Mental  Status: He is alert.  Psychiatric:        Behavior: Behavior normal.        Thought Content: Thought content normal.      ED Treatments / Results  Labs (all labs ordered are listed, but only abnormal results are displayed) Labs Reviewed  CBC WITH DIFFERENTIAL/PLATELET - Abnormal; Notable for the following components:      Result Value   RBC 3.03 (*)    Hemoglobin 9.2 (*)    HCT 29.6 (*)    All other components within normal limits  BASIC METABOLIC PANEL - Abnormal; Notable for the following components:   BUN 34 (*)    Creatinine, Ser 2.38 (*)    GFR calc non Af Amer 26 (*)    GFR calc Af Amer 30 (*)    All other components within normal limits  BRAIN NATRIURETIC PEPTIDE    EKG None  Radiology Dg Chest 2 View  Result Date: 09/09/2018 CLINICAL DATA:  Short of breath EXAM: CHEST - 2 VIEW COMPARISON:  09/02/2018 FINDINGS: Marked cardiac enlargement. Negative for heart failure. Negative for edema or effusion. Negative for pneumonia. IMPRESSION: Marked cardiac enlargement.  No acute abnormality. Electronically Signed   By: Franchot Gallo M.D.   On: 09/09/2018 17:13    Procedures Procedures (including critical care time)  Medications Ordered in ED Medications - No data to display   Initial Impression / Assessment and Plan / ED Course  I have reviewed the triage vital signs and the nursing notes.  Pertinent labs & imaging results that were available during my care of the patient were reviewed by me and considered in my medical decision making (see chart for details).     73 year old male with continued cough.  Explained with patient that the cough can be persistent for several weeks even after pneumonia is effectively treated.  He also has  some symptoms of sinusitis.  Decongestants.  Outpatient follow-up.  It has been determined that no acute conditions requiring further emergency intervention are present at this time. The patient has been advised of the diagnosis and plan.  I reviewed any labs and imaging including any potential incidental findings. I have reviewed nursing notes and appropriate previous records. We have discussed signs and symptoms that warrant return to the ED and they are listed in the discharge instructions.      Final Clinical Impressions(s) / ED Diagnoses   Final diagnoses:  Sinusitis, unspecified chronicity, unspecified location    ED Discharge Orders    None       Virgel Manifold, MD 09/17/18 [id number]

## 2018-09-10 ENCOUNTER — Telehealth: Payer: Self-pay | Admitting: Family Medicine

## 2018-09-10 NOTE — Telephone Encounter (Signed)
Please advise 

## 2018-09-10 NOTE — Telephone Encounter (Signed)
Patient's A1C was just collect and is not within diabetes range. He doesn't need insulin. Due to poor renal function, he is not candidate for metformin and will need to increase intake of vegetables and reduce white foods such as breads and rice. Exercise with walking. I will continue to monitor A1C levels at subsequent visits.

## 2018-09-10 NOTE — Telephone Encounter (Signed)
Patient called requesting advice because patient states all he has eaten today is a bowl of green beans and his blood sugar has shot up to 220, patient stated he would like to be places on insulin or be re-evaluated by provider today. Please follow up.

## 2018-09-11 ENCOUNTER — Encounter: Payer: Self-pay | Admitting: Emergency Medicine

## 2018-09-11 ENCOUNTER — Inpatient Hospital Stay: Payer: Medicare Other

## 2018-09-11 ENCOUNTER — Ambulatory Visit
Admission: EM | Admit: 2018-09-11 | Discharge: 2018-09-11 | Disposition: A | Discharge status: Home / Self Care | Payer: Medicare Other

## 2018-09-11 DIAGNOSIS — R42 Dizziness and giddiness: Secondary | ICD-10-CM | POA: Insufficient documentation

## 2018-09-11 DIAGNOSIS — R5383 Other fatigue: Secondary | ICD-10-CM | POA: Diagnosis not present

## 2018-09-11 NOTE — ED Triage Notes (Signed)
Pt presents to [facility] for assessment of hyperglycemia.  Denies hx of DM or taking any medications at home.  Pt states he's been feeing dizzy, not himself.  Pt states high sugars at home "for a couple weeks".

## 2018-09-11 NOTE — ED Provider Notes (Signed)
[name]    CSN: [id number] Arrival date & time: 09/11/18  1328     History   Chief Complaint Chief Complaint  Patient presents with  . Hyperglycemia    HPI [name] [name] is a 73 y.o. male history of CKD, CAD, CHF, hyperlipidemia, paroxysmal A. fib, presenting today for evaluation of elevated blood sugars.  Patient states that he has noticed his blood sugars spiking of recently especially after eating.  Patient is not currently on any medicines for his blood sugars and last A1c was 5.4, recently checked.  Patient is concerned as when he feels his sugars increasing he feels a sensation of dizziness.  He denies chest pain.  He has had some shortness of breath of recently, but relates this to his sinuses as well as recent pneumonia.  He was treated for pneumonia with a week of Levaquin.  He was also seen 2 days ago in the emergency room and was given dexamethasone.  He states that his breathing has improved significantly since the steroids.  He was not sent home on oral steroids.  He denies any nausea, vomiting or abdominal pain.  He works on trying to eat a balanced diet.  Denies history of thyroid issues.  HPI  Past Medical History:  Diagnosis Date  . Abnormal LFTs (liver function tests)   . Anxiety   . Aortic root dilatation ([place])    a. 12/2015 Echo: mod dil of sinus of valsalva and Ao root (43m).  b. 53 mm Echo 2018  . BPH with urinary obstruction   . Bradycardia   . 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 CAD (coronary artery disease)    a. 12/2015 NSTEMI/Cath (in setting of PAF):  LM nl, LAD 30p,  LCX 364mRCA  ok, AM 100%, RPDA1 40, RPDA 2 60 ->Med Rx.  . Childhood asthma   . Chronic diastolic CHF (congestive heart failure) (HCTecum[place]   a. 12/2015 Echo: EF 50-55%, mod AI, mod Ao root dil, mild MR, mod dil LA, mod RA.  . Marland KitchenOPD (chronic obstructive pulmonary disease) (HCGos[place]   pt denies at  preop  . Diabetes mellitus type II    diet controlled  . History of syncope   . Hyperlipidemia   . Hypertensive heart disease   . Kidney lump 04/04/2010   Overview:  Renal Cell Carcinoma   . Moderate aortic insufficiency    a. 12/2015 Echo: Mod AI.  . Marland Kitchenaroxysmal atrial fibrillation (HCKerm[place]   a. 12/2015 started on Xarelto (CHA2DS2VASc = 4-5).  . Pneumonia   . Prostate cancer (HCHumphre[place]  . Renal cell carcinoma (HCC[place]  . Sleep apnea    Does not like  CPAP  . Spinal stenosis in cervical region 10/12/2016    Patient Active Problem List   Diagnosis Date Noted  . Counseling regarding advance care planning and goals of care 06/22/2018  . AKI (acute kidney injury) (HCOl[place]  . Anemia   . Sepsis (HCPerry10/12/2017  . Acute lower UTI 06/05/2018  . Colon polyp 05/21/2018  . Aortic valve regurgitation 04/14/2018  . Medication management 04/14/2018  . Thoracic aortic aneurysm without rupture (HCLas [place]01/28/2019  . 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 Head trauma, subsequent encounter 10/12/2016  . 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 Spinal stenosis in cervical  region 10/12/2016  . Cervical spondylosis 10/12/2016  . Syncope and collapse 10/12/2016  . Fall   . Syncope 10/11/2016  . CAD (coronary artery disease)   . Chronic diastolic CHF (congestive heart failure) ([place])   . Hyperlipidemia   . Hypertensive heart disease   . Paroxysmal atrial fibrillation ([place]) 12/17/2015  . Multiple myeloma ([place]) 12/17/2015  . Mild intermittent asthma 10/18/2015  . Aortic root dilatation ([place]) 10/17/2015  . Diabetes ([place]) 10/17/2015  . 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 Hand paresthesia 12/09/2014  . Elevated prostate specific antigen (PSA) 07/12/2014  . Allergic rhinitis 07/12/2014  . LBP (low back pain) 07/12/2014  . Palpitation 07/11/2014  . Patient's other noncompliance with  medication regimen 08/10/2013  . Chronic venous insufficiency 07/23/2012  . Obstructive apnea 04/11/2011  . Essential (primary) hypertension 09/07/2010  . 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 Anxiety, generalized 04/04/2010  . Cardiac conduction disorder 02/27/2010  . Benign prostatic hyperplasia without urinary obstruction 09/13/2009  . Abnormal findings on examination of genitourinary organs 08/09/2009  . Hereditary and idiopathic neuropathy 07/05/2009    Past Surgical History:  Procedure Laterality Date  . BACK SURGERY    . CARDIAC CATHETERIZATION N/A 12/18/2015   Procedure: Left Heart Cath and Coronary Angiography;  Surgeon: Leonie Man, MD;  Location: [facility];  Service: Cardiovascular;  Laterality: N/A;  . DIAGNOSTIC LAPAROSCOPY     partial colectomy  . LAPAROSCOPIC PARTIAL COLECTOMY Right 05/21/2018   Procedure: LAPAROSCOPIC RIGHT  COLECTOMY ERAS PATHWAY;  Surgeon: Leighton Ruff, MD;  Location: [facility];  Service: General;  Laterality: Right;  . [place]   "replaced a disc"       Home Medications    Prior to Admission medications   Medication Sig Start Date End Date Taking? Authorizing Provider  albuterol (PROVENTIL) (2.5 MG/3ML) 0.083% nebulizer solution Take 3 mLs (2.5 mg total) by nebulization every 4 (four) hours as needed for wheezing or shortness of breath. 09/02/18  Yes Delo, Nathaneil Canary, MD  amLODipine (NORVASC) 10 MG tablet Take 1 tablet (10 mg total) by mouth daily. 09/07/18  Yes Scot Jun, FNP  atorvastatin (LIPITOR) 80 MG tablet Take 1 tablet (80 mg total) by mouth daily. 09/07/18 12/06/18 Yes Scot Jun, FNP  fluticasone (FLONASE) 50 MCG/ACT nasal spray Place 2 sprays into both nostrils daily. 08/18/18  Yes [provider]  loratadine (CLARITIN) 10 MG tablet Take 10 mg by mouth daily.   Yes [provider]  potassium chloride SA (K-DUR,KLOR-CON) 20 MEQ tablet Take 20 mEq by mouth 2 (two)  times daily.   Yes [provider]  tamsulosin (FLOMAX) 0.4 MG CAPS capsule Take 1 capsule (0.4 mg total) by mouth daily. 04/26/04  Yes Leighton Ruff, MD  blood glucose meter kit and supplies KIT Dispense based on patient and insurance preference. Use up to four times daily as directed. (FOR ICD-10 E11.69). 09/07/18   Scot Jun, FNP  cetirizine (ZYRTEC) 10 MG tablet Take 10 mg by mouth daily.    [provider]    Family History Family History  Problem Relation Age of Onset  . Emphysema Father   . Asthma Father   . Liver disease Father        tumor  . Heart disease Mother   . Hypertension Mother   . Asthma Sister   . Hypertension Son     Social History Social History   Tobacco Use  .  Smoking status: Former Smoker    Types: Cigars    Last attempt to quit: 09/02/1977    Years since quitting: 41.0  . Smokeless tobacco: Never Used  . Tobacco comment:    Substance Use Topics  . Alcohol use: Not Currently    Comment: "stopped drinking alcohol in ~ 1980; just drank a little on the weekends when I did drink"  . Drug use: No     Allergies   Amoxicillin and Penicillins   Review of Systems Review of Systems  Constitutional: Negative for activity change, appetite change, fatigue and fever.  HENT: Negative for congestion, sinus pressure and sore throat.   Eyes: Negative for photophobia, pain and visual disturbance.  Respiratory: Negative for cough and shortness of breath.   Cardiovascular: Negative for chest pain.  Gastrointestinal: Negative for abdominal pain, nausea and vomiting.  Endocrine: Negative for polydipsia, polyphagia and polyuria.  Genitourinary: Negative for decreased urine volume and hematuria.  Musculoskeletal: Negative for myalgias, neck pain and neck stiffness.  Neurological: Positive for dizziness and light-headedness. Negative for syncope, facial asymmetry, speech difficulty, weakness, numbness and headaches.     Physical  Exam Triage Vital Signs ED Triage Vitals [09/11/18 1337]  Enc Vitals Group     BP (!) 143/81     Pulse Rate 64     Resp 18     Temp (!) 97.5 F (36.4 C)     Temp Source Oral     SpO2 94 %     Weight      Height      Head Circumference      Peak Flow      Pain Score 0     Pain Loc      Pain Edu?      Excl. in [place]?    No data found.  Updated Vital Signs BP (!) 143/81 (BP Location: Left Arm)   Pulse 64   Temp (!) 97.5 F (36.4 C) (Oral)   Resp 18   SpO2 94%   Visual Acuity Right Eye Distance:   Left Eye Distance:   Bilateral Distance:    Right Eye Near:   Left Eye Near:    Bilateral Near:     Physical Exam Vitals signs and nursing note reviewed.  Constitutional:      Appearance: He is well-developed.  HENT:     Head: Normocephalic and atraumatic.     Ears:     Comments: Bilateral ears without tenderness to palpation of external auricle, tragus and mastoid, EAC's without erythema or swelling, TM's with good bony landmarks and cone of light. Non erythematous.    Nose:     Comments: Nasal mucosa erythematous, swollen turbinates bilaterally    Mouth/Throat:     Comments: Oral mucosa pink and moist, no tonsillar enlargement or exudate. Posterior pharynx patent and nonerythematous, no uvula deviation or swelling. Normal phonation. Eyes:     Extraocular Movements: Extraocular movements intact.     Conjunctiva/sclera: Conjunctivae normal.     Pupils: Pupils are equal, round, and reactive to light.     Comments: Appears to have proptosis of eyes, patient states his whole family has large eyes  Neck:     Musculoskeletal: Neck supple.  Cardiovascular:     Rate and Rhythm: Normal rate and regular rhythm.     Heart sounds: No murmur.  Pulmonary:     Effort: Pulmonary effort is normal. No respiratory distress.     Breath sounds: Wheezing present.  Comments: Breathing comfortably at rest, CTABL, mild expiratory wheezing throughout lungs, no  rales or other adventitious  sounds auscultated Abdominal:     Palpations: Abdomen is soft.     Tenderness: There is no abdominal tenderness.  Skin:    General: Skin is warm and dry.  Neurological:     General: No focal deficit present.     Mental Status: He is alert and oriented to person, place, and time.     Comments: Patient A&O x3, cranial nerves II-XII grossly intact, strength at shoulders, hips and knees 5/5, equal bilaterally, patellar reflex 2+ bilaterally. Gait without abnormality.      UC Treatments / Results  Labs (all labs ordered are listed, but only abnormal results are displayed) Labs Reviewed  CBC   Lab Results  Component Value Date   HGBA1C 5.4 09/07/2018    EKG None  Radiology Dg Chest 2 View  Result Date: 09/09/2018 CLINICAL DATA:  Short of breath EXAM: CHEST - 2 VIEW COMPARISON:  09/02/2018 FINDINGS: Marked cardiac enlargement. Negative for heart failure. Negative for edema or effusion. Negative for pneumonia. IMPRESSION: Marked cardiac enlargement.  No acute abnormality. Electronically Signed   By: Franchot Gallo M.D.   On: 09/09/2018 17:13    Procedures Procedures (including critical care time)  Medications Ordered in UC Medications - No data to display  Initial Impression / Assessment and Plan / UC Course  I have reviewed the triage vital signs and the nursing notes.  Pertinent labs & imaging results that were available during my care of the patient were reviewed by me and considered in my medical decision making (see chart for details).     EKG sinus bradycardia, no acute signs of ischemia or infarction, similar to previous EKG.  I-STAT slightly low potassium, hemoglobin.  CBC also obtained to check for any changes from recent labs.  Glucose 114 today.  We will discussed with patient continuing to increase potassium and iron in diet.  Exam relatively unremarkable.  Did advise patient to fill prescription for albuterol inhaler as he did have wheezing and lungs still.   Continue to monitor blood sugars, keep a log.Discussed strict return precautions. Patient verbalized understanding and is agreeable with plan.  Final Clinical Impressions(s) / UC Diagnoses   Final diagnoses:  Dizziness  Fatigue, unspecified type     Discharge Instructions     Your glucose is 114 today Continue to eat healthy, incorporate more potassium and iron into diet Follow up with Lavell Anchors for further discussion of blood sugars Please use inhaler for shortness of breath and wheezing   ED Prescriptions    None     Controlled Substance Prescriptions [place] Controlled Substance Registry consulted? Not Applicable   Janith Lima, [place] 09/11/18 1527

## 2018-09-11 NOTE — Discharge Instructions (Signed)
Your glucose is 114 today Continue to eat healthy, incorporate more potassium and iron into diet Follow up with Lavell Anchors for further discussion of blood sugars Please use inhaler for shortness of breath and wheezing

## 2018-09-11 NOTE — ED Notes (Signed)
Patient able to ambulate independently  

## 2018-09-11 NOTE — Telephone Encounter (Signed)
Left voice mail to call back 

## 2018-09-11 NOTE — ED Notes (Signed)
iStat Chem 8 Results:  Na: 140 K: 3.3 Cl: 104 ICa: 1.23 TCO2: 26 Glu: 114 BUN: 34 Crea: 2.1 Hct: 27 Hb*: 9.0 AnGap: 14  Results reported to [employer], PA-C

## 2018-09-14 NOTE — Telephone Encounter (Signed)
Patient notified

## 2018-09-18 ENCOUNTER — Inpatient Hospital Stay: Payer: Medicare Other

## 2018-09-18 ENCOUNTER — Inpatient Hospital Stay (HOSPITAL_BASED_OUTPATIENT_CLINIC_OR_DEPARTMENT_OTHER): Payer: Medicare Other | Admitting: Medical

## 2018-09-18 VITALS — BP 123/84 | HR 58 | Temp 97.9°F | Resp 18

## 2018-09-18 DIAGNOSIS — C9000 Multiple myeloma not having achieved remission: Secondary | ICD-10-CM

## 2018-09-18 DIAGNOSIS — Z5111 Encounter for antineoplastic chemotherapy: Secondary | ICD-10-CM

## 2018-09-18 DIAGNOSIS — R945 Abnormal results of liver function studies: Secondary | ICD-10-CM

## 2018-09-18 DIAGNOSIS — E119 Type 2 diabetes mellitus without complications: Secondary | ICD-10-CM | POA: Diagnosis not present

## 2018-09-18 DIAGNOSIS — I110 Hypertensive heart disease with heart failure: Secondary | ICD-10-CM

## 2018-09-18 DIAGNOSIS — Z9221 Personal history of antineoplastic chemotherapy: Secondary | ICD-10-CM

## 2018-09-18 DIAGNOSIS — I2510 Atherosclerotic heart disease of native coronary artery without angina pectoris: Secondary | ICD-10-CM | POA: Diagnosis not present

## 2018-09-18 DIAGNOSIS — F419 Anxiety disorder, unspecified: Secondary | ICD-10-CM | POA: Diagnosis not present

## 2018-09-18 DIAGNOSIS — R000 Tachycardia, unspecified: Secondary | ICD-10-CM

## 2018-09-18 DIAGNOSIS — G4730 Sleep apnea, unspecified: Secondary | ICD-10-CM | POA: Diagnosis not present

## 2018-09-18 DIAGNOSIS — Z79899 Other long term (current) drug therapy: Secondary | ICD-10-CM

## 2018-09-18 DIAGNOSIS — J449 Chronic obstructive pulmonary disease, unspecified: Secondary | ICD-10-CM | POA: Diagnosis not present

## 2018-09-18 DIAGNOSIS — N400 Enlarged prostate without lower urinary tract symptoms: Secondary | ICD-10-CM

## 2018-09-18 DIAGNOSIS — Z7189 Other specified counseling: Secondary | ICD-10-CM

## 2018-09-18 DIAGNOSIS — Z87891 Personal history of nicotine dependence: Secondary | ICD-10-CM

## 2018-09-18 DIAGNOSIS — I5032 Chronic diastolic (congestive) heart failure: Secondary | ICD-10-CM

## 2018-09-18 DIAGNOSIS — I480 Paroxysmal atrial fibrillation: Secondary | ICD-10-CM

## 2018-09-18 DIAGNOSIS — E785 Hyperlipidemia, unspecified: Secondary | ICD-10-CM

## 2018-09-18 LAB — CMP (CANCER CENTER ONLY)
ALBUMIN: 3.7 g/dL (ref 3.5–5.0)
ALT: 17 U/L (ref 0–44)
AST: 22 U/L (ref 15–41)
Alkaline Phosphatase: 95 U/L (ref 38–126)
Anion gap: 10 (ref 5–15)
BILIRUBIN TOTAL: 0.5 mg/dL (ref 0.3–1.2)
BUN: 29 mg/dL — ABNORMAL HIGH (ref 8–23)
CALCIUM: 9.1 mg/dL (ref 8.9–10.3)
CO2: 26 mmol/L (ref 22–32)
Chloride: 105 mmol/L (ref 98–111)
Creatinine: 1.67 mg/dL — ABNORMAL HIGH (ref 0.61–1.24)
GFR, Est AFR Am: 46 mL/min — ABNORMAL LOW (ref >60)
GFR, Estimated: 40 mL/min — ABNORMAL LOW (ref >60)
Glucose, Bld: 98 mg/dL (ref 70–99)
Potassium: 4.2 mmol/L (ref 3.5–5.1)
Sodium: 141 mmol/L (ref 135–145)
Total Protein: 6.6 g/dL (ref 6.5–8.1)

## 2018-09-18 LAB — CBC WITH DIFFERENTIAL/PLATELET
Abs Immature Granulocytes: 0.01 10*3/uL (ref 0.00–0.07)
Basophils Absolute: 0 10*3/uL (ref 0.0–0.1)
Basophils Relative: 0 %
Eosinophils Absolute: 0.1 10*3/uL (ref 0.0–0.5)
Eosinophils Relative: 2 %
HEMATOCRIT: 28.8 % — AB (ref 39.0–52.0)
Hemoglobin: 9.2 g/dL — ABNORMAL LOW (ref 13.0–17.0)
Immature Granulocytes: 0 %
Lymphocytes Relative: 20 %
Lymphs Abs: 1 10*3/uL (ref 0.7–4.0)
MCH: 31 pg (ref 26.0–34.0)
MCHC: 31.9 g/dL (ref 30.0–36.0)
MCV: 97 fL (ref 80.0–100.0)
MONO ABS: 0.5 10*3/uL (ref 0.1–1.0)
Monocytes Relative: 10 %
Neutro Abs: 3.5 10*3/uL (ref 1.7–7.7)
Neutrophils Relative %: 68 %
Platelets: 219 10*3/uL (ref 150–400)
RBC: 2.97 MIL/uL — ABNORMAL LOW (ref 4.22–5.81)
RDW: 14.7 % (ref 11.5–15.5)
WBC: 5.1 10*3/uL (ref 4.0–10.5)
nRBC: 0 % (ref 0.0–0.2)

## 2018-09-18 MED ORDER — SODIUM CHLORIDE 0.9 % IV SOLN
300.0000 mg/m2 | Freq: Once | INTRAVENOUS | Status: AC
  Administered 2018-09-18: 660 mg via INTRAVENOUS
  Filled 2018-09-18: qty 33

## 2018-09-18 MED ORDER — ACETAMINOPHEN 325 MG PO TABS
650.0000 mg | ORAL_TABLET | Freq: Once | ORAL | Status: AC
  Administered 2018-09-18: 650 mg via ORAL

## 2018-09-18 MED ORDER — PALONOSETRON HCL INJECTION 0.25 MG/5ML
0.2500 mg | Freq: Once | INTRAVENOUS | Status: AC
  Administered 2018-09-18: 0.25 mg via INTRAVENOUS

## 2018-09-18 MED ORDER — ACETAMINOPHEN 325 MG PO TABS
ORAL_TABLET | ORAL | Status: AC
  Filled 2018-09-18: qty 2

## 2018-09-18 MED ORDER — SODIUM CHLORIDE 0.9 % IV SOLN
Freq: Once | INTRAVENOUS | Status: AC
  Administered 2018-09-18: 15:00:00 via INTRAVENOUS
  Filled 2018-09-18: qty 250

## 2018-09-18 MED ORDER — PALONOSETRON HCL INJECTION 0.25 MG/5ML
INTRAVENOUS | Status: AC
  Filled 2018-09-18: qty 5

## 2018-09-18 MED ORDER — BORTEZOMIB CHEMO SQ INJECTION 3.5 MG (2.5MG/ML)
1.5000 mg/m2 | Freq: Once | INTRAMUSCULAR | Status: AC
  Administered 2018-09-18: 3.25 mg via SUBCUTANEOUS
  Filled 2018-09-18: qty 1.3

## 2018-09-18 NOTE — Patient Instructions (Signed)
[facility] Discharge Instructions for Patients Receiving Chemotherapy  Today you received the following chemotherapy agents Bortezomib (VELCADE) & Cyclophosphamide (CYTOXAN).  To help prevent nausea and vomiting after your treatment, we encourage you to take your nausea medication as prescribed.   If you develop nausea and vomiting that is not controlled by your nausea medication, call the clinic.   BELOW ARE SYMPTOMS THAT SHOULD BE REPORTED IMMEDIATELY:  *FEVER GREATER THAN 100.5 F  *CHILLS WITH OR WITHOUT FEVER  NAUSEA AND VOMITING THAT IS NOT CONTROLLED WITH YOUR NAUSEA MEDICATION  *UNUSUAL SHORTNESS OF BREATH  *UNUSUAL BRUISING OR BLEEDING  TENDERNESS IN MOUTH AND THROAT WITH OR WITHOUT PRESENCE OF ULCERS  *URINARY PROBLEMS  *BOWEL PROBLEMS  UNUSUAL RASH Items with * indicate a potential emergency and should be followed up as soon as possible.  Feel free to call the clinic should you have any questions or concerns. The clinic phone number is [phone].  Please show the [place] at check-in to the Emergency Department and triage nurse.

## 2018-09-18 NOTE — Progress Notes (Signed)
Pt seen by V. Tanner in infusion. OK tx today per V. Tanner PA W/ Creatinine 1.67.

## 2018-09-21 NOTE — Progress Notes (Signed)
[facility] Progress Note   [name] [name] [id number] Nov 18, 1945 73 y.o.  [name] [name] is managed by Dr. Sullivan Lone  Actively treated with chemotherapy/immunotherapy/hormonal therapy: yes  Current Therapy: Velcade and Cytoxan  Last Treated: 07/25/19 (cycle 1 day 22)  Assessment: Plan:    Multiple myeloma not having achieved remission ([place])   1) Multiple myeloma: The patient was seen in infusion.  He missed his last treatment due to a hospitalization for pneumonia.  His labs were reviewed.  We will proceed with cycle 2 day 1 of Velcade and Cytoxan today.  He is scheduled to return for consideration of cycle 2 day 8 of chemotherapy on 09/25/2018.  I reviewed the importance of regular treatments with the patient.  He expressed understanding.   Please see After Visit Summary for patient specific instructions.  Future Appointments  Date Time Provider [place]  09/25/2018  1:45 PM [facility] LAB 6 [facility] None  09/25/2018  2:30 PM [facility] INFUSION [facility] None  10/02/2018  8:30 AM [facility] LAB 2 [facility] None  10/02/2018  9:00 AM Brunetta Genera, MD [facility] None  10/02/2018 10:00 AM [facility] INFUSION [facility] None  10/06/2018  3:10 PM Scot Jun, FNP [facility] None  10/09/2018  1:45 PM [facility] LAB 6 [facility] None  10/09/2018  2:30 PM [facility] INFUSION [facility] None  01/06/2019  1:30 PM Scot Jun, FNP [facility] None    No orders of the defined types were placed in this encounter.      Subjective:   Patient ID:  [name] [name] is a 73 y.o. (DOB 04-26-1946) male.  Chief Complaint: No chief complaint on file.   HPI [name] is a 73 y.o. male with a light chain myeloma who is managed by Dr. Irene Limbo.  He missed his last scheduled chemotherapy as he was being seen in the ER for sinusitis and pneumonia.  He reports that he is feeling better.  He wants to be treated today.  He expresses understanding when told that  he is at risk for infections due to his multiple myeloma and that he is at risk for disease progression if not treated regularly.  Medications: I have reviewed the patient's current medications.  Allergies:  Allergies  Allergen Reactions  . Amoxicillin Other (See Comments)    Tolerates Unasyn. Can't move Has patient had a PCN reaction causing immediate rash, facial/tongue/throat swelling, SOB or lightheadedness with hypotension: No Has patient had a PCN reaction causing severe rash involving mucus membranes or skin necrosis: No Has patient had a PCN reaction that required hospitalization No Has patient had a PCN reaction occurring within the last 10 years: No If all of the above answers are "NO", then may proceed with Cephalosporin use.   [name] Penicillins Other (See Comments)    Tolerates Unasyn. Can't move/dizziness Has patient had a PCN reaction causing immediate rash, facial/tongue/throat swelling, SOB or lightheadedness with hypotension: No Has patient had a PCN reaction causing severe rash involving mucus membranes or skin necrosis: No Has patient had a PCN reaction that required hospitalization No Has patient had a PCN reaction occurring within the last 10 years: No If all of the above answers are "NO", then may proceed with Cephalosporin use.  Other reaction(s): O    Past Medical History:  Diagnosis Date  . Abnormal LFTs (liver function tests)   . Anxiety   . Aortic root dilatation ([place])    a. 12/2015 Echo: mod dil of sinus of valsalva and  Ao root (43m).  b. 53 mm Echo 2018  . BPH with urinary obstruction   . Bradycardia   . 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 CAD (coronary artery disease)    a. 12/2015 NSTEMI/Cath (in setting of PAF):  LM nl, LAD 30p,  LCX 332mRCA  ok, AM 100%, RPDA1 40, RPDA 2 60 ->Med Rx.  . Childhood asthma   . Chronic diastolic CHF (congestive heart failure) ([facility]    a. 12/2015 Echo: EF 50-55%, mod AI, mod Ao root dil, mild MR, mod dil LA, mod RA.  . Marland KitchenOPD (chronic obstructive pulmonary disease) ([facility] [place]   pt denies at preop  . Diabetes mellitus type II    diet controlled  . History of syncope   . Hyperlipidemia   . Hypertensive heart disease   . Kidney lump 04/04/2010   Overview:  Renal Cell Carcinoma   . Moderate aortic insufficiency    a. 12/2015 Echo: Mod AI.  . Marland Kitchenaroxysmal atrial fibrillation ([facility]   a. 12/2015 started on Xarelto (CHA2DS2VASc = 4-5).  . Pneumonia   . Prostate cancer ([facility]  . Renal cell carcinoma ([facility]  . Sleep apnea    Does not like  CPAP  . Spinal stenosis in cervical region 10/12/2016    Past Surgical History:  Procedure Laterality Date  . BACK SURGERY    . CARDIAC CATHETERIZATION N/A 12/18/2015   Procedure: Left Heart Cath and Coronary Angiography;  Surgeon: DaLeonie ManMD;  [facility];  Service: Cardiovascular;  Laterality: N/A;  . DIAGNOSTIC LAPAROSCOPY     partial colectomy  . LAPAROSCOPIC PARTIAL COLECTOMY Right 05/21/2018   Procedure: LAPAROSCOPIC RIGHT  COLECTOMY ERAS PATHWAY;  Surgeon: ThLeighton RuffMD;  Location: [facility];  Service: General;  Laterality: Right;  . [place] "replaced a disc"    Family History  Problem Relation Age of Onset  . Emphysema Father   . Asthma Father   . Liver disease Father        tumor  . Heart disease Mother   . Hypertension Mother   . Asthma Sister   . Hypertension Son     Social History   Socioeconomic History  . Marital status: Widowed    Spouse name: Not on file  . Number of children: Not on file  . Years of education: Not on file  . Highest education level: Not on file  Occupational History  . Not on file  Social Needs  . Financial resource strain: Not on file  . Food insecurity:    Worry: Not on file    Inability: Not on file  . Transportation needs:    Medical: Not on file    Non-medical: Not on file  Tobacco Use    . Smoking status: Former Smoker    Types: Cigars    Last attempt to quit: 09/02/1977    Years since quitting: 41.0  . Smokeless tobacco: Never Used  . Tobacco comment:    Substance and Sexual Activity  . Alcohol use: Not Currently    Comment: "stopped drinking alcohol in ~ 1980; just drank a little on the weekends when I did drink"  . Drug use: No  . Sexual activity: Not Currently  Lifestyle  . Physical activity:    Days per week: Not on file    Minutes per session: Not  on file  . Stress: Not on file  Relationships  . Social connections:    Talks on phone: Not on file    Gets together: Not on file    Attends religious service: Not on file    Active member of club or organization: Not on file    Attends meetings of clubs or organizations: Not on file    Relationship status: Not on file  . Intimate partner violence:    Fear of current or ex partner: Not on file    Emotionally abused: Not on file    Physically abused: Not on file    Forced sexual activity: Not on file  Other Topics Concern  . Not on file  Social History Narrative   Lives alone.  Wife died in 23-Mar-2023.  Lives in apartment.      Past Medical History, Surgical history, Social history, and Family history were reviewed and updated as appropriate.   Please see review of systems for further details on the patient's review from today.   Review of Systems:  Review of Systems  Constitutional: Negative for chills, diaphoresis, fatigue and fever.  HENT: Negative for congestion, postnasal drip, rhinorrhea, sore throat, trouble swallowing and voice change.   Respiratory: Positive for cough. Negative for chest tightness, shortness of breath and wheezing.   Cardiovascular: Negative for chest pain and palpitations.  Gastrointestinal: Negative for abdominal pain, constipation, diarrhea, nausea and vomiting.  Musculoskeletal: Negative for back pain and myalgias.  Neurological: Negative for dizziness, light-headedness and  headaches.    Objective:   Physical Exam:  There were no vitals taken for this visit. ECOG: 1  Physical Exam Constitutional:      General: He is not in acute distress.    Appearance: Normal appearance. He is not diaphoretic.     Comments: The patient is an adult male who appears to be in no acute distress.  HENT:     Head: Normocephalic and atraumatic.  Eyes:     General: No scleral icterus.       Right eye: No discharge.        Left eye: No discharge.     Comments:  Exophthalmos  Cardiovascular:     Rate and Rhythm: Normal rate and regular rhythm.     Heart sounds: Normal heart sounds. No murmur. No friction rub. No gallop.   Pulmonary:     Effort: Pulmonary effort is normal. No respiratory distress.     Breath sounds: Normal breath sounds. No wheezing or rales.  Skin:    General: Skin is warm and dry.     Findings: No erythema or rash.  Neurological:     Mental Status: He is alert.     Gait: Gait normal.  Psychiatric:        Mood and Affect: Mood normal.        Behavior: Behavior normal.        Thought Content: Thought content normal.        Judgment: Judgment normal.     Lab Review:     Component Value Date/Time   NA 141 09/18/2018 1333   NA 139 01/07/2018 1402   K 4.2 09/18/2018 1333   CL 105 09/18/2018 1333   CO2 26 09/18/2018 1333   GLUCOSE 98 09/18/2018 1333   BUN 29 (H) 09/18/2018 1333   BUN 29 (H) 01/07/2018 1402   CREATININE 1.67 (H) 09/18/2018 1333   CREATININE 1.01 03/29/2016 1032   CALCIUM 9.1 09/18/2018 1333  PROT 6.6 09/18/2018 1333   ALBUMIN 3.7 09/18/2018 1333   AST 22 09/18/2018 1333   ALT 17 09/18/2018 1333   ALKPHOS 95 09/18/2018 1333   BILITOT 0.5 09/18/2018 1333   GFRNONAA 40 (L) 09/18/2018 1333   GFRAA 46 (L) 09/18/2018 1333       Component Value Date/Time   WBC 5.1 09/18/2018 1333   RBC 2.97 (L) 09/18/2018 1333   HGB 9.2 (L) 09/18/2018 1333   HGB 8.1 (L) 07/17/2018 1123   HGB 10.6 (L) 01/07/2018 1402   HCT 28.8 (L)  09/18/2018 1333   HCT 31.3 (L) 01/07/2018 1402   PLT 219 09/18/2018 1333   PLT 221 07/17/2018 1123   PLT 293 01/07/2018 1402   MCV 97.0 09/18/2018 1333   MCV 92 01/07/2018 1402   MCH 31.0 09/18/2018 1333   MCHC 31.9 09/18/2018 1333   RDW 14.7 09/18/2018 1333   RDW 14.1 01/07/2018 1402   LYMPHSABS 1.0 09/18/2018 1333   LYMPHSABS 1.6 01/07/2018 1402   MONOABS 0.5 09/18/2018 1333   EOSABS 0.1 09/18/2018 1333   EOSABS 0.1 01/07/2018 1402   BASOSABS 0.0 09/18/2018 1333   BASOSABS 0.0 01/07/2018 1402   -------------------------------  Imaging from last 24 hours (if applicable):  Radiology interpretation: Dg Chest 2 View  Result Date: 09/09/2018 CLINICAL DATA:  Short of breath EXAM: CHEST - 2 VIEW COMPARISON:  09/02/2018 FINDINGS: Marked cardiac enlargement. Negative for heart failure. Negative for edema or effusion. Negative for pneumonia. IMPRESSION: Marked cardiac enlargement.  No acute abnormality. Electronically Signed   By: Franchot Gallo M.D.   On: 09/09/2018 17:13   Dg Chest 2 View  Result Date: 09/02/2018 CLINICAL DATA:  Acute onset of shortness of breath, cough and right eye drainage. EXAM: CHEST - 2 VIEW COMPARISON:  Chest radiograph performed 06/05/2018, and CTA of the chest performed 05/19/2018 FINDINGS: The lungs are well-aerated. Mild right basilar airspace opacity is compatible with pneumonia. There is no evidence of pleural effusion or pneumothorax. The heart is borderline enlarged. Right paratracheal density reflects normal vasculature on prior CTA. No acute osseous abnormalities are seen. IMPRESSION: 1. Mild right basilar pneumonia noted. 2. Borderline cardiomegaly. Electronically Signed   By: [name] M.D.   On: 09/02/2018 03:45

## 2018-09-25 ENCOUNTER — Inpatient Hospital Stay: Payer: Medicare Other

## 2018-10-02 ENCOUNTER — Inpatient Hospital Stay: Payer: Medicare Other

## 2018-10-02 ENCOUNTER — Inpatient Hospital Stay: Payer: Medicare Other | Admitting: Hematology

## 2018-10-02 ENCOUNTER — Encounter: Payer: Self-pay | Admitting: *Deleted

## 2018-10-02 ENCOUNTER — Telehealth: Payer: Self-pay | Admitting: Hematology

## 2018-10-02 ENCOUNTER — Telehealth: Payer: Self-pay | Admitting: *Deleted

## 2018-10-02 ENCOUNTER — Telehealth: Payer: Self-pay | Admitting: General Practice

## 2018-10-02 NOTE — Telephone Encounter (Signed)
Tried to reach patient's son a few times today, have left voice message to return call at earliest convenience. Will keep trying.

## 2018-10-02 NOTE — Telephone Encounter (Signed)
Patient no showed in clinic today. He has failed to appear in the clinic for follow-up and for multiple treatment instances and has decided not to maintain appropriate follow-up in the cancer center at this time. I called him on his cell phone today and left a message to immediately call back for several very urgent medical concerns given his noncompliance with medical treatment. 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 -He also has a kidney mass which is likely a primary renal cell carcinoma which needs urologic referral urgently based on what the patient decides on his goals of care. -If the patient chooses to not pursue active oncologic treatments given his progressive myeloma likely primary renal cell cancer, functional status and likelihood of progressive renal failure he would be a reasonable candidate for consideration of hospice. -I will forward my note to his primary care physician to help coordinate his care and help define further goals of care and reconsult [place] if needed. -My nurse will also call his primary care doctor's office to make them aware. -We shall try to contact the patient once again to make him aware of her recommendations as well and send him a certified letter. -At this time the patient has not been able to maintain appropriate medical follow-up with our clinic to be able to avail of appropriate treatments and follow-up.  Brunetta Genera MD MS

## 2018-10-02 NOTE — Telephone Encounter (Signed)
[employer] CSW Progress Note  Request received from [place] to reach out to patient as he has missed multiple appointments and MD would like to determine patient's desires/goals for car.  Spoke w patient on phone.  He states "I feel so much better now that Im not taking those treatments, I can drink water, hold my bowels, eat.  I am much stronger now - I think I am getting healed."  Has strong faith background, states "I appreciate the doctors, but God is the one who is healing me."  States he would like to return to see MD and have labs drawn, wants to know where he stands in terms of disease process.  States he does not want chemotherapy at this time because "I am getting my strength back, I can walk and run now and I couldn't do that before."  Messaged MD and Bedelia Person to let them know that patient would like final visit w MD, states he will answer his phone.  Patient does have difficulty w transportation - "I dont have a car and I cant get there" - patient has been enrolled in [employer] and has gotten rides to treatment in the past.  If rescheduled, he will need help w transport and Lauralee Evener can schedule this for patient.  Edwyna Shell, LCSW Clinical Social Worker Phone:  [phone]

## 2018-10-02 NOTE — Telephone Encounter (Signed)
Attempted to contact patient regarding appt with Dr. Irene Limbo.. No answer and no voice mail set up

## 2018-10-02 NOTE — Telephone Encounter (Signed)
[facility] CSW Progress Note   

## 2018-10-06 ENCOUNTER — Encounter: Payer: Self-pay | Admitting: Family Medicine

## 2018-10-06 ENCOUNTER — Telehealth: Payer: Self-pay | Admitting: Family Medicine

## 2018-10-06 ENCOUNTER — Ambulatory Visit (INDEPENDENT_AMBULATORY_CARE_PROVIDER_SITE_OTHER): Payer: Medicare Other | Admitting: Family Medicine

## 2018-10-06 VITALS — BP 118/76 | HR 68 | Temp 98.0°F | Resp 17 | Ht 66.0 in | Wt 220.4 lb

## 2018-10-06 DIAGNOSIS — N2889 Other specified disorders of kidney and ureter: Secondary | ICD-10-CM | POA: Diagnosis not present

## 2018-10-06 DIAGNOSIS — C9000 Multiple myeloma not having achieved remission: Secondary | ICD-10-CM

## 2018-10-06 DIAGNOSIS — E1169 Type 2 diabetes mellitus with other specified complication: Secondary | ICD-10-CM

## 2018-10-06 DIAGNOSIS — Z794 Long term (current) use of insulin: Secondary | ICD-10-CM | POA: Diagnosis not present

## 2018-10-06 DIAGNOSIS — N183 Chronic kidney disease, stage 3 unspecified: Secondary | ICD-10-CM

## 2018-10-06 DIAGNOSIS — D638 Anemia in other chronic diseases classified elsewhere: Secondary | ICD-10-CM

## 2018-10-06 DIAGNOSIS — E1165 Type 2 diabetes mellitus with hyperglycemia: Secondary | ICD-10-CM

## 2018-10-06 LAB — GLUCOSE, POCT (MANUAL RESULT ENTRY): POC GLUCOSE: 212 mg/dL — AB (ref 70–99)

## 2018-10-06 MED ORDER — FERROUS SULFATE 325 (65 FE) MG PO TABS: 325.0000 mg | ORAL_TABLET | Freq: Two times a day (BID) | ORAL | 3 refills | Status: DC

## 2018-10-06 MED ORDER — GLUCOSE BLOOD VI STRP: ORAL_STRIP | 12 refills | Status: DC

## 2018-10-06 MED ORDER — IPRATROPIUM BROMIDE 0.03 % NA SOLN: 2.0000 | Freq: Two times a day (BID) | NASAL | 0 refills | Status: DC

## 2018-10-06 MED ORDER — IPRATROPIUM BROMIDE 0.03 % NA SOLN: 2.0000 | Freq: Every day | NASAL | 0 refills | Status: DC

## 2018-10-06 NOTE — Patient Instructions (Addendum)
Contact Dr. Grier Mitts office to discuss resuming treatment or notify me here if you prefer a referral to palliative/hospice care.    Palliative Care Palliative care involves care of body, mind, and spirit in order to improve a person's quality of life. Palliative care services are offered to people dealing with serious and life-threatening illnesses, often in the hospital or in a long-term care setting. The specific services are different for each person and are based on the person's needs and preferences. Palliative care requires a team of people who ensure:  Control of pain and other symptoms.  Family support.  Spiritual support.  Emotional and social support.  Comfort. Palliative care is a way to bring comfort and peace of mind to a person and his or her family. It can have a positive impact on the person's quality of life and the course of the illness. What is the difference between palliative care and hospice? Palliative care and hospice have similar goals of managing symptoms, promoting comfort, improving quality of life, and maintaining a person's dignity. However, palliative care may be offered during any phase of a serious illness, while hospice care is usually offered when a person is expected to live for 6 months or less. Who can receive palliative care services? Palliative care is offered to children and adults who are seriously ill. It is often offered in cases where a person:  Is not responding well to treatment.  Needs pain management.  Had treatment or surgery and developed symptoms that are difficult to manage.  Is undergoing a treatment to cure a condition (active treatment), like chemotherapy.  Has a diagnosis of an advanced disease, or a disease that shortens his or her life. A health care provider will usually recommend palliative care services when more support would be helpful. Family members and friends may also receive palliative care services to cope with stress  and other concerns. Who makes up the palliative care team? The following people make up a palliative care team:  The person receiving care and his or her family.  Physicians, including primary health care providers and specialists.  Nurses.  A Education officer, museum. Depending on a person's needs, the following people may also be included on a palliative care team:  A pain specialist.  A hospice specialist.  A financial or Research officer, trade union.  Religious or spiritual leaders.  A care coordinator or case manager.  A bereavement coordinator. The team will talk with the person and his or her family about:  The role of the pain specialist and the hospice specialist.  The person's physical symptoms, such as pain, nausea, vomiting, and shortness of breath.  Stress, depression, and anxiety symptoms.  Function and mobility issues and how to stay as active as possible.  Treatment options and how they may affect life.  Spiritual wishes, such as rituals and prayer.  Legacy and memory making activities.  Life and death as a normal process.  Advance directives or living wills, health care proxies, and end-of-life care.  Any other concerns or issues. The palliative care team will make it okay to talk about difficult issues and topics. They will address spiritual and emotional concerns and give the person's preferences high importance. Summary  Palliative care involves care of body, mind, and spirit in order to improve a person's quality of life.  The specific services are different for each person and are based on the person's needs and preferences.  Palliative care is a way to bring comfort and peace of mind  to a person and his or her family. This information is not intended to replace advice given to you by your health care provider. Make sure you discuss any questions you have with your health care provider. Document Released: 08/24/2013 Document Revised: 11/04/2016 Document  Reviewed: 11/04/2016 Elsevier Interactive Patient Education  2019 [place] Directive  Advance directives are legal documents that let you make choices ahead of time about your health care and medical treatment in case you become unable to communicate for yourself. Advance directives are a way for you to communicate your wishes to family, friends, and health care providers. This can help convey your decisions about end-of-life care if you become unable to communicate. Discussing and writing advance directives should happen over time rather than all at once. Advance directives can be changed depending on your situation and what you want, even after you have signed the advance directives. If you do not have an advance directive, some states assign family decision makers to act on your behalf based on how closely you are related to them. Each state has its own laws regarding advance directives. You may want to check with your health care provider, attorney, or state representative about the laws in your state. There are different types of advance directives, such as:  Medical power of attorney.  Living will.  Do not resuscitate (DNR) or do not attempt resuscitation (DNAR) order. Health care proxy and medical power of attorney A health care proxy, also called a health care agent, is a person who is appointed to make medical decisions for you in cases in which you are unable to make the decisions yourself. Generally, people choose someone they know well and trust to represent their preferences. Make sure to ask this person for an agreement to act as your proxy. A proxy may have to exercise judgment in the event of a medical decision for which your wishes are not known. A medical power of attorney is a legal document that names your health care proxy. Depending on the laws in your state, after the document is written, it may also need to  be:  Signed.  Notarized.  Dated.  Copied.  Witnessed.  Incorporated into your medical record. You may also want to appoint someone to manage your financial affairs in a situation in which you are unable to do so. This is called a durable power of attorney for finances. It is a separate legal document from the durable power of attorney for health care. You may choose the same person or someone different from your health care proxy to act as your agent in financial matters. If you do not appoint a proxy, or if there is a concern that the proxy is not acting in your best interests, a court-appointed guardian may be designated to act on your behalf. Living will A living will is a set of instructions documenting your wishes about medical care when you cannot express them yourself. Health care providers should keep a copy of your living will in your medical record. You may want to give a copy to family members or friends. To alert caregivers in case of an emergency, you can place a card in your wallet to let them know that you have a living will and where they can find it. A living will is used if you become:  Terminally ill.  Incapacitated.  Unable to communicate or make decisions. Items to consider in your living will include:  The use or non-use of life-sustaining equipment, such as dialysis machines and breathing machines (ventilators).  A DNR or DNAR order, which is the instruction not to use cardiopulmonary resuscitation (CPR) if breathing or heartbeat stops.  The use or non-use of tube feeding.  Withholding of food and fluids.  Comfort (palliative) care when the goal becomes comfort rather than a cure.  Organ and tissue donation. A living will does not give instructions for distributing your money and property if you should pass away. It is recommended that you seek the advice of a lawyer when writing a will. Decisions about taxes, beneficiaries, and asset distribution will be  legally binding. This process can relieve your family and friends of any concerns surrounding disputes or questions that may come up about the distribution of your assets. DNR or DNAR A DNR or DNAR order is a request not to have CPR in the event that your heart stops beating or you stop breathing. If a DNR or DNAR order has not been made and shared, a health care provider will try to help any patient whose heart has stopped or who has stopped breathing. If you plan to have surgery, talk with your health care provider about how your DNR or DNAR order will be followed if problems occur. Summary  Advance directives are the legal documents that allow you to make choices ahead of time about your health care and medical treatment in case you become unable to communicate for yourself.  The process of discussing and writing advance directives should happen over time. You can change the advance directives, even after you have signed them.  Advance directives include DNR or DNAR orders, living wills, and designating an agent as your medical power of attorney. This information is not intended to replace advice given to you by your health care provider. Make sure you discuss any questions you have with your health care provider. Document Released: 11/26/2007 Document Revised: 07/08/2016 Document Reviewed: 07/08/2016 Elsevier Interactive Patient Education  2019 [employer].

## 2018-10-06 NOTE — Progress Notes (Signed)
Established Patient Office Visit  Subjective:  Patient ID: [name] [name], male    DOB: September 21, 1945  Age: 73 y.o. MRN: [id number]  CC:  Chief Complaint  Patient presents with  . Diabetes    HPI [name] [name] presents for diabetes follow-up which was initially scheduled for 11/04/2018.  Patient is actively receiving chemotherapy treatment for multiple myeloma under the care of Dr. Irene Limbo. Received a note from Dr. Irene Limbo on 10/02/18, dating the patient has been contacted multiple labs in regards to scheduling a follow-up appointment and has not returned nurses phone calls or schedule an appointment. Dr. Irene Limbo is also sent a certified letter with recommendations regarding care and patient still has not been responsive and following up with a cancer center.  Patient has also been referred to urology for evaluation of a kidney mass he has not followed up with the urology referral either.  Office has attempted to contact patient's son to try and coordinate a visit with patient and son however those phone calls have never been returned by patient's son.  Patient is here today and verbalizes that he has decided to forego any additional cancer treatments but wants to discuss other options with Dr. Irene Limbo.  He is completely opposed to palliative or hospice care. When asked he has chosen not to follow-up at the cancer center he reports car problems and problems receiving is with his current phone.  Reports that he will follow-up with the cancer center discuss any additional options.  He admits that he has not followed up with urology and was uncertain of this referral.  This referral was mentioned during his last office visit here and contact information for [facility] was also placed on his after visit summary.  Patient was given this information to review from last visit during today's visit.  Patient is also concerned regarding his diabetes. Blood sugar on arrival today is 212 however he is eaten lunch prior to  today's visit. His last A1c was 5.4.  Currently not prescribed any oral medications for diabetes given the chronicity of his current renal functioning. He is adamant today that he feels he needs medication to control his diabetes. He is not routinely monitoring his blood sugars at home.  Continues to complain of bilateral nare congestion is worsened at night.  During his previous visit he was placed on cetirizine and advised to purchase over the counter saline spray. He reports that neither of these options have improved his symptoms.  Past Medical History:  Diagnosis Date  . Abnormal LFTs (liver function tests)   . Anxiety   . Aortic root dilatation ([place])    a. 12/2015 Echo: mod dil of sinus of valsalva and Ao root (43m).  b. 53 mm Echo 2018  . BPH with urinary obstruction   . Bradycardia   . 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 CAD (coronary artery disease)    a. 12/2015 NSTEMI/Cath (in setting of PAF):  LM nl, LAD 30p,  LCX 337mRCA  ok, AM 100%, RPDA1 40, RPDA 2 60 ->Med Rx.  . Childhood asthma   . Chronic diastolic CHF (congestive heart failure) ([facility]   a. 12/2015 Echo: EF 50-55%, mod AI, mod Ao root dil, mild MR, mod dil LA, mod RA.  . Marland KitchenOPD (chronic obstructive pulmonary disease) ([facility]   pt denies at preop  . Diabetes mellitus type II    diet  controlled  . History of syncope   . Hyperlipidemia   . Hypertensive heart disease   . Kidney lump 04/04/2010   Overview:  Renal Cell Carcinoma   . Moderate aortic insufficiency    a. 12/2015 Echo: Mod AI.  [name] Paroxysmal atrial fibrillation ([place])    a. 12/2015 started on Xarelto (CHA2DS2VASc = 4-5).  . Pneumonia   . Prostate cancer ([place])   . Renal cell carcinoma ([place])   . Sleep apnea    Does not like  CPAP  . Spinal stenosis in cervical region 10/12/2016    Past Surgical History:  Procedure Laterality Date  . BACK SURGERY    . CARDIAC CATHETERIZATION  N/A 12/18/2015   Procedure: Left Heart Cath and Coronary Angiography;  Surgeon: Leonie Man, MD;  Location: [facility];  Service: Cardiovascular;  Laterality: N/A;  . DIAGNOSTIC LAPAROSCOPY     partial colectomy  . LAPAROSCOPIC PARTIAL COLECTOMY Right 05/21/2018   Procedure: LAPAROSCOPIC RIGHT  COLECTOMY ERAS PATHWAY;  Surgeon: Leighton Ruff, MD;  Location: [facility];  Service: General;  Laterality: Right;  . [place]   "replaced a disc"    Family History  Problem Relation Age of Onset  . Emphysema Father   . Asthma Father   . Liver disease Father        tumor  . Heart disease Mother   . Hypertension Mother   . Asthma Sister   . Hypertension Son     Social History   Socioeconomic History  . Marital status: Widowed    Spouse name: Not on file  . Number of children: Not on file  . Years of education: Not on file  . Highest education level: Not on file  Occupational History  . Not on file  Social Needs  . Financial resource strain: Not on file  . Food insecurity:    Worry: Not on file    Inability: Not on file  . Transportation needs:    Medical: Not on file    Non-medical: Not on file  Tobacco Use  . Smoking status: Former Smoker    Types: Cigars    Last attempt to quit: 09/02/1977    Years since quitting: 41.1  . Smokeless tobacco: Never Used  . Tobacco comment:    Substance and Sexual Activity  . Alcohol use: Not Currently    Comment: "stopped drinking alcohol in ~ 1980; just drank a little on the weekends when I did drink"  . Drug use: No  . Sexual activity: Not Currently  Lifestyle  . Physical activity:    Days per week: Not on file    Minutes per session: Not on file  . Stress: Not on file  Relationships  . Social connections:    Talks on phone: Not on file    Gets together: Not on file    Attends religious service: Not on file    Active member of club or organization: Not on file    Attends meetings of clubs or organizations:  Not on file    Relationship status: Not on file  . Intimate partner violence:    Fear of current or ex partner: Not on file    Emotionally abused: Not on file    Physically abused: Not on file    Forced sexual activity: Not on file  Other Topics Concern  . Not on file  Social History Narrative   Lives alone.  Wife died in April 14, 2023.  Lives in apartment.      Outpatient Medications Prior to Visit  Medication Sig Dispense Refill  . albuterol (PROVENTIL) (2.5 MG/3ML) 0.083% nebulizer solution Take 3 mLs (2.5 mg total) by nebulization every 4 (four) hours as needed for wheezing or shortness of breath. 30 vial 0  . amLODipine (NORVASC) 10 MG tablet Take 1 tablet (10 mg total) by mouth daily. 90 tablet 1  . atorvastatin (LIPITOR) 80 MG tablet Take 1 tablet (80 mg total) by mouth daily. 90 tablet 3  . blood glucose meter kit and supplies KIT Dispense based on patient and insurance preference. Use up to four times daily as directed. (FOR ICD-10 E11.69). 1 each 0  . cetirizine (ZYRTEC) 10 MG tablet Take 10 mg by mouth daily.    . fluticasone (FLONASE) 50 MCG/ACT nasal spray Place 2 sprays into both nostrils daily.    [name] loratadine (CLARITIN) 10 MG tablet Take 10 mg by mouth daily.    . potassium chloride SA (K-DUR,KLOR-CON) 20 MEQ tablet Take 20 mEq by mouth 2 (two) times daily.    . tamsulosin (FLOMAX) 0.4 MG CAPS capsule Take 1 capsule (0.4 mg total) by mouth daily. 30 capsule 0   No facility-administered medications prior to visit.     Allergies  Allergen Reactions  . Amoxicillin Other (See Comments)    Tolerates Unasyn. Can't move Has patient had a PCN reaction causing immediate rash, facial/tongue/throat swelling, SOB or lightheadedness with hypotension: No Has patient had a PCN reaction causing severe rash involving mucus membranes or skin necrosis: No Has patient had a PCN reaction that required hospitalization No Has patient had a PCN reaction occurring within the last 10 years: No If  all of the above answers are "NO", then may proceed with Cephalosporin use.   [name] Penicillins Other (See Comments)    Tolerates Unasyn. Can't move/dizziness Has patient had a PCN reaction causing immediate rash, facial/tongue/throat swelling, SOB or lightheadedness with hypotension: No Has patient had a PCN reaction causing severe rash involving mucus membranes or skin necrosis: No Has patient had a PCN reaction that required hospitalization No Has patient had a PCN reaction occurring within the last 10 years: No If all of the above answers are "NO", then may proceed with Cephalosporin use.  Other reaction(s): O    ROS Review of Systems Constitutional: Negative for fever, chills, diaphoresis, activity change, appetite change and fatigue. HENT: Negative for ear pain, nosebleeds, positive for nasal congestion Eyes: Negative for pain, discharge, redness, itching and visual disturbance. Respiratory: Negative for cough, choking, chest tightness, shortness of breath, wheezing and stridor.  Cardiovascular: Negative for chest pain, palpitations and leg swelling. Gastrointestinal: Negative for abdominal distention. Genitourinary: Negative for dysuria, urgency, frequency, hematuria, flank pain, decreased urine volume, difficulty urinating and dyspareunia.  Musculoskeletal: Negative for back pain, joint swelling, arthralgia and gait problem. Neurological: Negative for dizziness, tremors, seizures, syncope, facial asymmetry, speech difficulty, weakness, light-headedness, numbness and headaches.  Hematological: Negative for adenopathy. Does not bruise/bleed easily. Psychiatric/Behavioral: Negative for hallucinations, behavioral problems, confusion, dysphoric mood, decreased concentration and agitation.     Objective:    Physical Exam BP 118/76   Pulse 68   Temp 98 F (36.7 C) (Oral)   Resp 17   Ht _0  (1.676 m)   Wt 220 lb 6.4 oz (100 kg)   SpO2 94%   BMI 35.57 kg/m    General  appearance: alert, well developed, well nourished, cooperative and in no distress HEENT: Normocephalic, bilateral nares erythematous  with dried secretions  Respiratory: Respirations even and unlabored, normal respiratory rate Heart: RRR normal, no murmurs or gallop rhythms auscultated  Extremities: No gross deformities. Gait normal. Skin: Skin color, texture, turgor normal. No rashes seen  Psych: Appropriate mood and affect. Neurologic: Mental status: Alert, oriented to person, place, and time, thought content appropriate.   Wt Readings from Last 3 Encounters:  10/06/18 220 lb 6.4 oz (100 kg)  09/07/18 219 lb 12.8 oz (99.7 kg)  09/02/18 216 lb (98 kg)     Health Maintenance Due  Topic Date Due  . FOOT EXAM  04/04/1954  . OPHTHALMOLOGY EXAM  04/04/1954  . URINE MICROALBUMIN  04/04/1954  . COLONOSCOPY  04/04/1994    There are no preventive care reminders to display for this patient.  Lab Results  Component Value Date   TSH 1.908 10/11/2016   Lab Results  Component Value Date   WBC 5.1 09/18/2018   HGB 9.2 (L) 09/18/2018   HCT 28.8 (L) 09/18/2018   MCV 97.0 09/18/2018   PLT 219 09/18/2018   Lab Results  Component Value Date   NA 141 09/18/2018   K 4.2 09/18/2018   CO2 26 09/18/2018   GLUCOSE 98 09/18/2018   BUN 29 (H) 09/18/2018   CREATININE 1.67 (H) 09/18/2018   BILITOT 0.5 09/18/2018   ALKPHOS 95 09/18/2018   AST 22 09/18/2018   ALT 17 09/18/2018   PROT 6.6 09/18/2018   ALBUMIN 3.7 09/18/2018   CALCIUM 9.1 09/18/2018   ANIONGAP 10 09/18/2018   Lab Results  Component Value Date   CHOL 199 09/30/2017   Lab Results  Component Value Date   HDL 56 09/30/2017   Lab Results  Component Value Date   LDLCALC 121 (H) 09/30/2017   Lab Results  Component Value Date   TRIG 109 09/30/2017   Lab Results  Component Value Date   CHOLHDL 3.6 09/30/2017   Lab Results  Component Value Date   HGBA1C 5.4 09/07/2018      Assessment & Plan:  1. Type 2 diabetes  mellitus with other specified complication, with long-term current use of insulin (HCC) Recent A1C 5.4 well controlled diabetes.  No medications indicated at present, especially given patient renal history, - Glucose (CBG)-212  - Microalbumin/Creatinine Ratio, Urine - Comprehensive metabolic panel  2. Multiple myeloma not having achieved remission () 3. Renal mass Advised patient to follow-up at the cancer center with Dr. Irene Limbo. We discussed  Discussed palliative as well as hospice care and patient is opposed to both at this point in time.  I encourage patient to contact his son and request that he accompany him to his follow-up visit here or at the cancer center.  4. Anemia, chronic disease - CBC with Differential - Iron, TIBC and Ferritin Panel -Ordered oral ferrous 325 mg once daily   5. Stage 3 chronic kidney disease ([place]) -Follow-up with urology regarding renal mass  Meds ordered this encounter  Medications  . glucose blood test strip    Sig: Check blood sugar twice per day. E.11.9    Dispense:  100 each    Refill:  12  . DISCONTD: ipratropium (ATROVENT) 0.03 % nasal spray    Sig: Place 2 sprays into both nostrils 2 (two) times daily.    Dispense:  30 mL    Refill:  0  . ipratropium (ATROVENT) 0.03 % nasal spray    Sig: Place 2 sprays into both nostrils at bedtime.    Dispense:  30 mL  Refill:  0  . ferrous sulfate (FERROUSUL) 325 (65 FE) MG tablet    Sig: Take 1 tablet (325 mg total) by mouth 2 (two) times daily with a meal.    Dispense:  60 tablet    Refill:  3    Follow-up: Return in about 1 month (around 11/04/2018).   Molli Barrows, FNP Primary Care at [facility] [address] [place] [phone]: [phone]

## 2018-10-07 LAB — CBC WITH DIFFERENTIAL/PLATELET
BASOS: 1 %
Basophils Absolute: 0 10*3/uL (ref 0.0–0.2)
EOS (ABSOLUTE): 0.1 10*3/uL (ref 0.0–0.4)
Eos: 2 %
Hematocrit: 28.8 % — ABNORMAL LOW (ref 37.5–51.0)
Hemoglobin: 9.5 g/dL — ABNORMAL LOW (ref 13.0–17.7)
Immature Grans (Abs): 0 10*3/uL (ref 0.0–0.1)
Immature Granulocytes: 0 %
Lymphocytes Absolute: 1.3 10*3/uL (ref 0.7–3.1)
Lymphs: 23 %
MCH: 31.6 pg (ref 26.6–33.0)
MCHC: 33 g/dL (ref 31.5–35.7)
MCV: 96 fL (ref 79–97)
Monocytes Absolute: 0.5 10*3/uL (ref 0.1–0.9)
Monocytes: 10 %
NEUTROS PCT: 64 %
Neutrophils Absolute: 3.6 10*3/uL (ref 1.4–7.0)
Platelets: 206 10*3/uL (ref 150–450)
RBC: 3.01 x10E6/uL — ABNORMAL LOW (ref 4.14–5.80)
RDW: 14.1 % (ref 11.6–15.4)
WBC: 5.6 10*3/uL (ref 3.4–10.8)

## 2018-10-07 LAB — IRON,TIBC AND FERRITIN PANEL
Ferritin: 321 ng/mL (ref 30–400)
Iron Saturation: 21 % (ref 15–55)
Iron: 56 ug/dL (ref 38–169)
Total Iron Binding Capacity: 264 ug/dL (ref 250–450)
UIBC: 208 ug/dL (ref 111–343)

## 2018-10-07 LAB — COMPREHENSIVE METABOLIC PANEL
A/G RATIO: 2 (ref 1.2–2.2)
ALT: 17 IU/L (ref 0–44)
AST: 22 IU/L (ref 0–40)
Albumin: 4.3 g/dL (ref 3.7–4.7)
Alkaline Phosphatase: 98 IU/L (ref 39–117)
BUN/Creatinine Ratio: 19 (ref 10–24)
BUN: 32 mg/dL — ABNORMAL HIGH (ref 8–27)
Bilirubin Total: 0.3 mg/dL (ref 0.0–1.2)
CO2: 21 mmol/L (ref 20–29)
CREATININE: 1.68 mg/dL — AB (ref 0.76–1.27)
Calcium: 9.1 mg/dL (ref 8.6–10.2)
Chloride: 103 mmol/L (ref 96–106)
GFR calc Af Amer: 46 mL/min/{1.73_m2} — ABNORMAL LOW (ref >59)
GFR, EST NON AFRICAN AMERICAN: 39 mL/min/{1.73_m2} — AB (ref >59)
Globulin, Total: 2.2 g/dL (ref 1.5–4.5)
Glucose: 94 mg/dL (ref 65–99)
Potassium: 4.1 mmol/L (ref 3.5–5.2)
Sodium: 142 mmol/L (ref 134–144)
Total Protein: 6.5 g/dL (ref 6.0–8.5)

## 2018-10-07 LAB — MICROALBUMIN / CREATININE URINE RATIO
Creatinine, Urine: 142.4 mg/dL
Microalb/Creat Ratio: 55 mg/g creat — ABNORMAL HIGH (ref 0–29)
Microalbumin, Urine: 78.3 ug/mL

## 2018-10-08 ENCOUNTER — Telehealth: Payer: Self-pay | Admitting: *Deleted

## 2018-10-08 NOTE — Telephone Encounter (Signed)
Contacted patient and left voice mail to call office to confirm if keeping appts on Friday 10/09/2018.

## 2018-10-09 ENCOUNTER — Inpatient Hospital Stay: Payer: Medicare Other

## 2018-10-09 ENCOUNTER — Inpatient Hospital Stay: Payer: Medicare Other | Attending: Hematology

## 2018-10-09 NOTE — Progress Notes (Signed)
Patient notified of results & recommendations. Expressed understanding.

## 2018-10-12 NOTE — Telephone Encounter (Signed)
erroneous

## 2018-10-13 NOTE — Progress Notes (Deleted)
Cardiology Office Note   Date:  10/13/2018   ID:  [name] [name], DOB [birth year], MRN [id number]  PCP:  Scot Jun, FNP  Cardiologist:  Hochrein  No chief complaint on file.    History of Present Illness: [name] [name] is a 73 y.o. male who presents for ongoing assessment and management of paroxysmal atrial fibrillation, Aortic insufficiency, Hypertensive CM. Other history includes OSA, HL, and adenocarcinoma of the colon and was here on 04/14/2018 for cardiac evaluation prior to his surgery.    On that visit he was not taking Xarelto as directed as reported that he was afraid his blood would be too thin. He was convinced by Dr. Percival Spanish to restart this with a CHADS VASC Score of 3. He was to start back after surgery. He also had taken himself off of Lipitor but agreed to take it again.   He had right colectomy on 05/21/18. Due to hypertension, he was started on hydralazine. He was also noted to have obstructive uropathy and AKI and urosepsis.[name] He is followed by nephrology.He has an indwelling catheter.  Past Medical History:  Diagnosis Date  . Abnormal LFTs (liver function tests)   . Anxiety   . Aortic root dilatation ([place])    a. 12/2015 Echo: mod dil of sinus of valsalva and Ao root (16m).  b. 53 mm Echo 2018  . BPH with urinary obstruction   . Bradycardia   . 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 CAD (coronary artery disease)    a. 12/2015 NSTEMI/Cath (in setting of PAF):  LM nl, LAD 30p,  LCX 339mRCA  ok, AM 100%, RPDA1 40, RPDA 2 60 ->Med Rx.  . Childhood asthma   . Chronic diastolic CHF (congestive heart failure) ([facility]   a. 12/2015 Echo: EF 50-55%, mod AI, mod Ao root dil, mild MR, mod dil LA, mod RA.  . Marland KitchenOPD (chronic obstructive pulmonary disease) ([facility]   pt denies at preop  . Diabetes mellitus type II    diet controlled  . History of syncope   . Hyperlipidemia   . Hypertensive heart  disease   . Kidney lump 04/04/2010   Overview:  Renal Cell Carcinoma   . Moderate aortic insufficiency    a. 12/2015 Echo: Mod AI.  . Marland Kitchenaroxysmal atrial fibrillation ([facility]CM[place]   a. 12/2015 started on Xarelto (CHA2DS2VASc = 4-5).  . Pneumonia   . Prostate cancer ([facility]  . Renal cell carcinoma ([facility]  . Sleep apnea    Does not like  CPAP  . Spinal stenosis in cervical region 10/12/2016    Past Surgical History:  Procedure Laterality Date  . BACK SURGERY    . CARDIAC CATHETERIZATION N/A 12/18/2015   Procedure: Left Heart Cath and Coronary Angiography;  Surgeon: DaLeonie ManMD;  [facility];  Service: Cardiovascular;  Laterality: N/A;  . DIAGNOSTIC LAPAROSCOPY     partial colectomy  . LAPAROSCOPIC PARTIAL COLECTOMY Right 05/21/2018   Procedure: LAPAROSCOPIC RIGHT  COLECTOMY ERAS PATHWAY;  Surgeon: ThLeighton RuffMD;  [facility];  Service: General;  Laterality: Right;  . [place] "[place]"     Current Outpatient Medications  Medication Sig Dispense Refill  . albuterol (PROVENTIL) (2.5 MG/3ML) 0.083% nebulizer solution Take 3 mLs (2.5 mg total) by nebulization every 4 (four) hours as needed for wheezing or shortness  of breath. 30 vial 0  . amLODipine (NORVASC) 10 MG tablet Take 1 tablet (10 mg total) by mouth daily. 90 tablet 1  . atorvastatin (LIPITOR) 80 MG tablet Take 1 tablet (80 mg total) by mouth daily. 90 tablet 3  . blood glucose meter kit and supplies KIT Dispense based on patient and insurance preference. Use up to four times daily as directed. (FOR ICD-10 E11.69). 1 each 0  . cetirizine (ZYRTEC) 10 MG tablet Take 10 mg by mouth daily.    . ferrous sulfate (FERROUSUL) 325 (65 FE) MG tablet Take 1 tablet (325 mg total) by mouth 2 (two) times daily with a meal. 60 tablet 3  . fluticasone (FLONASE) 50 MCG/ACT nasal spray Place 2 sprays into both nostrils daily.    [name] glucose blood test strip Check blood sugar twice per day. E.11.9 100  each 12  . ipratropium (ATROVENT) 0.03 % nasal spray Place 2 sprays into both nostrils at bedtime. 30 mL 0  . loratadine (CLARITIN) 10 MG tablet Take 10 mg by mouth daily.    . potassium chloride SA (K-DUR,KLOR-CON) 20 MEQ tablet Take 20 mEq by mouth 2 (two) times daily.    . tamsulosin (FLOMAX) 0.4 MG CAPS capsule Take 1 capsule (0.4 mg total) by mouth daily. 30 capsule 0   No current facility-administered medications for this visit.     Allergies:   Amoxicillin and Penicillins    Social History:  The patient  reports that he quit smoking about 41 years ago. His smoking use included cigars. He has never used smokeless tobacco. He reports previous alcohol use. He reports that he does not use drugs.   Family History:  The patient's family history includes Asthma in his father and sister; Emphysema in his father; Heart disease in his mother; Hypertension in his mother and son; Liver disease in his father.    ROS: All other systems are reviewed and negative. Unless otherwise mentioned in H&P    PHYSICAL EXAM: VS:  There were no vitals taken for this visit. , BMI There is no height or weight on file to calculate BMI. GEN: Well nourished, well developed, in no acute distress HEENT: normal Neck: no JVD, carotid bruits, or masses Cardiac: ***RRR; no murmurs, rubs, or gallops,no edema  Respiratory:  Clear to auscultation bilaterally, normal work of breathing GI: soft, nontender, nondistended, + BS MS: no deformity or atrophy Skin: warm and dry, no rash Neuro:  Strength and sensation are intact Psych: euthymic mood, full affect   EKG:  EKG {ACTION; IS/IS [id number]} ordered today. The ekg ordered today demonstrates ***   Recent Labs: 09/09/2018: B Natriuretic Peptide 36.2 10/06/2018: ALT 17; BUN 32; Creatinine, Ser 1.68; Hemoglobin 9.5; Platelets 206; Potassium 4.1; Sodium 142    Lipid Panel    Component Value Date/Time   CHOL 199 09/30/2017 1042   TRIG 109 09/30/2017 1042   HDL  56 09/30/2017 1042   CHOLHDL 3.6 09/30/2017 1042   CHOLHDL 3.3 10/11/2016 1900   VLDL 27 10/11/2016 1900   LDLCALC 121 (H) 09/30/2017 1042      Wt Readings from Last 3 Encounters:  10/06/18 220 lb 6.4 oz (100 kg)  09/07/18 219 lb 12.8 oz (99.7 kg)  09/02/18 216 lb (98 kg)      Other studies Reviewed: Echocardiogram Jun 23, 2018 Left ventricle: The cavity size was mildly dilated. There was   moderate concentric hypertrophy. Systolic function was normal.   The estimated ejection fraction was in the range of  55% to 60%.   Wall motion was normal; there were no regional wall motion   abnormalities. Features are consistent with a pseudonormal left   ventricular filling pattern, with concomitant abnormal relaxation   and increased filling pressure (grade 2 diastolic dysfunction).   Doppler parameters are consistent with high ventricular filling   pressure. - Aortic valve: Trileaflet; normal thickness, mildly calcified   leaflets. There was mild regurgitation. Regurgitation pressure   half-time: 612 ms. - Aorta: Aortic root dimension: 49 mm (ED). Ascending aortic   diameter: 46 mm (S). - Aortic root: The aortic root was moderately dilated. - Ascending aorta: The ascending aorta was moderately dilated. - Mitral valve: There was mild regurgitation. - Left atrium: The atrium was severely dilated. - Right atrium: The atrium was severely dilated. - Atrial septum: There was increased thickness of the septum,   consistent with lipomatous hypertrophy. - Pulmonic valve: There was mild regurgitation. - Pulmonary arteries: PA peak pressure: 31 mm Hg (S).  Impressions:  - There is a large nondescript density that is best seen in the   parasternal long axis view protruding into the RA. It is unclear   as to if this represents interference from a large aortic root   aneurysm vs. RA mass. In the apical 5 and 4 chamber views this is   noted again and in the 4 chamber view it appears to be in  the RA   with flow around it. 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  ASSESSMENT AND PLAN:  1.  ***   Current medicines are reviewed at length with the patient today.    Labs/ tests ordered today include: *** Phill Myron. [place] Pugh, ANP, AACC   10/13/2018 3:20 PM    [facility] [address] Office [phone] Fax [phone]

## 2018-10-14 ENCOUNTER — Ambulatory Visit: Payer: Medicare Other | Admitting: Adult Health

## 2018-10-21 ENCOUNTER — Ambulatory Visit: Payer: Medicare Other | Admitting: Adult Health

## 2018-10-21 NOTE — Progress Notes (Deleted)
Cardiology Office Note   Date:  10/21/2018   ID:  [name] [name], DOB [birth year], MRN [id number]  PCP:  Scot Jun, FNP  Cardiologist:  Hochrein  No chief complaint on file.    History of Present Illness: [name] [name] is a 73 y.o. male who presents for ongoing assessment and management of aortic insuffiencey, atrial fib, aortic root per CTA demonstrated 4.3 cm root, CAD with CTO of the acute marginal, but non-obstructive otherwise per cath 9030, chronic diastolic CHF, HTN, HL,  with other history to include Type II diabetes, OSA not on CPAP.   On last office visit with Dr. Percival Spanish he was started back on Xarelto, as he had stopped this on his own on previous appointment documentation. He restarted amlodipine and Lipitor after being seen.   He is being treated for multiple myeloma by Dr. Irene Limbo and urology for evaluation for kidney mass. . He had discussion about Palliative Care and Hospice with PCP on last office visit with them on 10/06/2008. He was deciding whether to continue treatment for cancer or not. Son was helping him with his care.   Past Medical History:  Diagnosis Date  . Abnormal LFTs (liver function tests)   . Anxiety   . Aortic root dilatation ([place])    a. 12/2015 Echo: mod dil of sinus of valsalva and Ao root (20m).  b. 53 mm Echo 2018  . BPH with urinary obstruction   . Bradycardia   . 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 CAD (coronary artery disease)    a. 12/2015 NSTEMI/Cath (in setting of PAF):  LM nl, LAD 30p,  LCX 342mRCA  ok, AM 100%, RPDA1 40, RPDA 2 60 ->Med Rx.  . Childhood asthma   . Chronic diastolic CHF (congestive heart failure) (HCJamison [place]   a. 12/2015 Echo: EF 50-55%, mod AI, mod Ao root dil, mild MR, mod dil LA, mod RA.  . Marland KitchenOPD (chronic obstructive pulmonary disease) (HCCarroll   pt denies at preop  . Diabetes mellitus type II    diet controlled  . History of syncope   .  Hyperlipidemia   . Hypertensive heart disease   . Kidney lump 04/04/2010   Overview:  Renal Cell Carcinoma   . Moderate aortic insufficiency    a. 12/2015 Echo: Mod AI.  . Marland Kitchenaroxysmal atrial fibrillation (HCChenow[place]   a. 12/2015 started on Xarelto (CHA2DS2VASc = 4-5).  . Pneumonia   . Prostate cancer (HCPierce  . Renal cell carcinoma (HCMount [place]  . Sleep apnea    Does not like  CPAP  . Spinal stenosis in cervical region 10/12/2016    Past Surgical History:  Procedure Laterality Date  . BACK SURGERY    . CARDIAC CATHETERIZATION N/A 12/18/2015   Procedure: Left Heart Cath and Coronary Angiography;  Surgeon: DaLeonie ManMD;  [facility];  Service: Cardiovascular;  Laterality: N/A;  . DIAGNOSTIC LAPAROSCOPY     partial colectomy  . LAPAROSCOPIC PARTIAL COLECTOMY Right 05/21/2018   Procedure: LAPAROSCOPIC RIGHT  COLECTOMY ERAS PATHWAY;  Surgeon: ThLeighton RuffMD;  Location: [facility];  Service: General;  Laterality: Right;  . LUBrule "replaced a disc"     Current Outpatient Medications  Medication Sig Dispense Refill  . albuterol (PROVENTIL) (2.5 MG/3ML) 0.083% nebulizer solution Take 3 mLs (2.5 mg total) by nebulization every 4 (four)  hours as needed for wheezing or shortness of breath. 30 vial 0  . amLODipine (NORVASC) 10 MG tablet Take 1 tablet (10 mg total) by mouth daily. 90 tablet 1  . atorvastatin (LIPITOR) 80 MG tablet Take 1 tablet (80 mg total) by mouth daily. 90 tablet 3  . blood glucose meter kit and supplies KIT Dispense based on patient and insurance preference. Use up to four times daily as directed. (FOR ICD-10 E11.69). 1 each 0  . cetirizine (ZYRTEC) 10 MG tablet Take 10 mg by mouth daily.    . ferrous sulfate (FERROUSUL) 325 (65 FE) MG tablet Take 1 tablet (325 mg total) by mouth 2 (two) times daily with a meal. 60 tablet 3  . fluticasone (FLONASE) 50 MCG/ACT nasal spray Place 2 sprays into both nostrils daily.    [name] glucose blood test strip  Check blood sugar twice per day. E.11.9 100 each 12  . ipratropium (ATROVENT) 0.03 % nasal spray Place 2 sprays into both nostrils at bedtime. 30 mL 0  . loratadine (CLARITIN) 10 MG tablet Take 10 mg by mouth daily.    . potassium chloride SA (K-DUR,KLOR-CON) 20 MEQ tablet Take 20 mEq by mouth 2 (two) times daily.    . tamsulosin (FLOMAX) 0.4 MG CAPS capsule Take 1 capsule (0.4 mg total) by mouth daily. 30 capsule 0   No current facility-administered medications for this visit.     Allergies:   Amoxicillin and Penicillins    Social History:  The patient  reports that he quit smoking about 41 years ago. His smoking use included cigars. He has never used smokeless tobacco. He reports previous alcohol use. He reports that he does not use drugs.   Family History:  The patient's family history includes Asthma in his father and sister; Emphysema in his father; Heart disease in his mother; Hypertension in his mother and son; Liver disease in his father.    ROS: All other systems are reviewed and negative. Unless otherwise mentioned in H&P    PHYSICAL EXAM: VS:  There were no vitals taken for this visit. , BMI There is no height or weight on file to calculate BMI. GEN: Well nourished, well developed, in no acute distress HEENT: normal Neck: no JVD, carotid bruits, or masses Cardiac: ***RRR; no murmurs, rubs, or gallops,no edema  Respiratory:  Clear to auscultation bilaterally, normal work of breathing GI: soft, nontender, nondistended, + BS MS: no deformity or atrophy Skin: warm and dry, no rash Neuro:  Strength and sensation are intact Psych: euthymic mood, full affect   EKG:  EKG {ACTION; IS/IS [id number]} ordered today. The ekg ordered today demonstrates ***   Recent Labs: 09/09/2018: B Natriuretic Peptide 36.2 10/06/2018: ALT 17; BUN 32; Creatinine, Ser 1.68; Hemoglobin 9.5; Platelets 206; Potassium 4.1; Sodium 142    Lipid Panel    Component Value Date/Time   CHOL 199  09/30/2017 1042   TRIG 109 09/30/2017 1042   HDL 56 09/30/2017 1042   CHOLHDL 3.6 09/30/2017 1042   CHOLHDL 3.3 10/11/2016 1900   VLDL 27 10/11/2016 1900   LDLCALC 121 (H) 09/30/2017 1042      Wt Readings from Last 3 Encounters:  10/06/18 220 lb 6.4 oz (100 kg)  09/07/18 219 lb 12.8 oz (99.7 kg)  09/02/18 216 lb (98 kg)      Other studies Reviewed: Additional studies/ records that were reviewed today include: ***. Review of the above records demonstrates: ***   ASSESSMENT AND PLAN:  1.  ***  Current medicines are reviewed at length with the patient today.    Labs/ tests ordered today include: *** Phill Myron. [place] Pugh, ANP, AACC   10/21/2018 8:00 AM    [address] Office [phone] Fax [phone]

## 2018-10-22 ENCOUNTER — Emergency Department (HOSPITAL_COMMUNITY): Payer: Medicare Other

## 2018-10-22 ENCOUNTER — Encounter: Payer: Self-pay | Admitting: *Deleted

## 2018-10-22 ENCOUNTER — Other Ambulatory Visit: Payer: Self-pay

## 2018-10-22 ENCOUNTER — Encounter (HOSPITAL_COMMUNITY): Payer: Self-pay

## 2018-10-22 ENCOUNTER — Observation Stay (HOSPITAL_COMMUNITY)
Admission: EM | Admit: 2018-10-22 | Discharge: 2018-10-23 | Disposition: A | Discharge status: Home / Self Care | Payer: Medicare Other | Attending: Family Medicine | Admitting: Family Medicine

## 2018-10-22 DIAGNOSIS — R001 Bradycardia, unspecified: Principal | ICD-10-CM

## 2018-10-22 DIAGNOSIS — I2510 Atherosclerotic heart disease of native coronary artery without angina pectoris: Secondary | ICD-10-CM | POA: Diagnosis not present

## 2018-10-22 DIAGNOSIS — E119 Type 2 diabetes mellitus without complications: Secondary | ICD-10-CM | POA: Diagnosis not present

## 2018-10-22 DIAGNOSIS — D472 Monoclonal gammopathy: Secondary | ICD-10-CM | POA: Diagnosis present

## 2018-10-22 DIAGNOSIS — I480 Paroxysmal atrial fibrillation: Secondary | ICD-10-CM | POA: Diagnosis present

## 2018-10-22 DIAGNOSIS — Z79899 Other long term (current) drug therapy: Secondary | ICD-10-CM | POA: Diagnosis not present

## 2018-10-22 DIAGNOSIS — I110 Hypertensive heart disease with heart failure: Secondary | ICD-10-CM | POA: Insufficient documentation

## 2018-10-22 DIAGNOSIS — E782 Mixed hyperlipidemia: Secondary | ICD-10-CM

## 2018-10-22 DIAGNOSIS — D649 Anemia, unspecified: Secondary | ICD-10-CM | POA: Diagnosis present

## 2018-10-22 DIAGNOSIS — Z87891 Personal history of nicotine dependence: Secondary | ICD-10-CM | POA: Diagnosis not present

## 2018-10-22 DIAGNOSIS — R079 Chest pain, unspecified: Secondary | ICD-10-CM | POA: Diagnosis not present

## 2018-10-22 DIAGNOSIS — E785 Hyperlipidemia, unspecified: Secondary | ICD-10-CM | POA: Diagnosis present

## 2018-10-22 DIAGNOSIS — N400 Enlarged prostate without lower urinary tract symptoms: Secondary | ICD-10-CM | POA: Diagnosis present

## 2018-10-22 DIAGNOSIS — I77810 Thoracic aortic ectasia: Secondary | ICD-10-CM | POA: Diagnosis present

## 2018-10-22 DIAGNOSIS — R002 Palpitations: Secondary | ICD-10-CM | POA: Diagnosis present

## 2018-10-22 DIAGNOSIS — I351 Nonrheumatic aortic (valve) insufficiency: Secondary | ICD-10-CM | POA: Diagnosis present

## 2018-10-22 DIAGNOSIS — I5032 Chronic diastolic (congestive) heart failure: Secondary | ICD-10-CM | POA: Diagnosis present

## 2018-10-22 DIAGNOSIS — J45909 Unspecified asthma, uncomplicated: Secondary | ICD-10-CM | POA: Diagnosis not present

## 2018-10-22 DIAGNOSIS — I2583 Coronary atherosclerosis due to lipid rich plaque: Secondary | ICD-10-CM

## 2018-10-22 HISTORY — DX: Abnormal findings on diagnostic imaging of liver and biliary tract: R93.2

## 2018-10-22 HISTORY — DX: Venous insufficiency (chronic) (peripheral): I87.2

## 2018-10-22 HISTORY — DX: Multiple myeloma not having achieved remission: C90.00

## 2018-10-22 HISTORY — DX: Malignant neoplasm of colon, unspecified: C18.9

## 2018-10-22 HISTORY — DX: Chronic kidney disease, stage 3 (moderate): N18.3

## 2018-10-22 HISTORY — DX: Chronic kidney disease, stage 3 unspecified: N18.30

## 2018-10-22 HISTORY — DX: Thoracic aortic aneurysm, without rupture: I71.2

## 2018-10-22 HISTORY — DX: Aneurysm of the ascending aorta, without rupture: I71.21

## 2018-10-22 LAB — BASIC METABOLIC PANEL
ANION GAP: 8 (ref 5–15)
BUN: 27 mg/dL — ABNORMAL HIGH (ref 8–23)
CO2: 27 mmol/L (ref 22–32)
Calcium: 9.2 mg/dL (ref 8.9–10.3)
Chloride: 105 mmol/L (ref 98–111)
Creatinine, Ser: 1.56 mg/dL — ABNORMAL HIGH (ref 0.61–1.24)
GFR calc Af Amer: 50 mL/min — ABNORMAL LOW (ref >60)
GFR calc non Af Amer: 43 mL/min — ABNORMAL LOW (ref >60)
Glucose, Bld: 131 mg/dL — ABNORMAL HIGH (ref 70–99)
Potassium: 3.4 mmol/L — ABNORMAL LOW (ref 3.5–5.1)
Sodium: 140 mmol/L (ref 135–145)

## 2018-10-22 LAB — CBC
HCT: 31.4 % — ABNORMAL LOW (ref 39.0–52.0)
Hemoglobin: 10.3 g/dL — ABNORMAL LOW (ref 13.0–17.0)
MCH: 31.7 pg (ref 26.0–34.0)
MCHC: 32.8 g/dL (ref 30.0–36.0)
MCV: 96.6 fL (ref 80.0–100.0)
NRBC: 0 % (ref 0.0–0.2)
PLATELETS: 192 10*3/uL (ref 150–400)
RBC: 3.25 MIL/uL — ABNORMAL LOW (ref 4.22–5.81)
RDW: 14.1 % (ref 11.5–15.5)
WBC: 4.6 10*3/uL (ref 4.0–10.5)

## 2018-10-22 LAB — I-STAT TROPONIN, ED: Troponin i, poc: 0.01 ng/mL (ref 0.00–0.08)

## 2018-10-22 LAB — TSH: TSH: 2.099 u[IU]/mL (ref 0.350–4.500)

## 2018-10-22 LAB — TROPONIN I: Troponin I: <0.03 ng/mL (ref <0.03)

## 2018-10-22 MED ORDER — ASPIRIN 81 MG PO CHEW
324.0000 mg | CHEWABLE_TABLET | Freq: Once | ORAL | Status: AC
  Administered 2018-10-22: 324 mg via ORAL
  Filled 2018-10-22: qty 4

## 2018-10-22 MED ORDER — HEPARIN SODIUM (PORCINE) 5000 UNIT/ML IJ SOLN
5000.0000 [IU] | Freq: Three times a day (TID) | INTRAMUSCULAR | Status: DC
  Administered 2018-10-22 – 2018-10-23 (×2): 5000 [IU] via SUBCUTANEOUS
  Filled 2018-10-22 (×2): qty 1

## 2018-10-22 MED ORDER — ONDANSETRON HCL 4 MG/2ML IJ SOLN: 4.0000 mg | Freq: Four times a day (QID) | INTRAMUSCULAR | Status: DC | PRN

## 2018-10-22 MED ORDER — FLUTICASONE PROPIONATE 50 MCG/ACT NA SUSP: 2.0000 | Freq: Every day | NASAL | Status: DC

## 2018-10-22 MED ORDER — FERROUS SULFATE 325 (65 FE) MG PO TABS
325.0000 mg | ORAL_TABLET | Freq: Two times a day (BID) | ORAL | Status: DC
  Filled 2018-10-22: qty 1

## 2018-10-22 MED ORDER — TAMSULOSIN HCL 0.4 MG PO CAPS
0.4000 mg | ORAL_CAPSULE | Freq: Every day | ORAL | Status: DC
  Administered 2018-10-22: 0.4 mg via ORAL
  Filled 2018-10-22 (×2): qty 1

## 2018-10-22 MED ORDER — ASPIRIN EC 81 MG PO TBEC
81.0000 mg | DELAYED_RELEASE_TABLET | Freq: Every day | ORAL | Status: DC
  Administered 2018-10-22: 81 mg via ORAL
  Filled 2018-10-22 (×2): qty 1

## 2018-10-22 MED ORDER — SODIUM CHLORIDE 0.9% FLUSH: 3.0000 mL | Freq: Once | INTRAVENOUS | Status: DC

## 2018-10-22 MED ORDER — ATORVASTATIN CALCIUM 80 MG PO TABS
80.0000 mg | ORAL_TABLET | Freq: Every day | ORAL | Status: DC
  Filled 2018-10-22: qty 1

## 2018-10-22 MED ORDER — ACETAMINOPHEN 325 MG PO TABS: 650.0000 mg | ORAL_TABLET | ORAL | Status: DC | PRN

## 2018-10-22 MED ORDER — HYDRALAZINE HCL 20 MG/ML IJ SOLN
5.0000 mg | INTRAMUSCULAR | Status: DC | PRN
  Administered 2018-10-22: 5 mg via INTRAVENOUS
  Filled 2018-10-22: qty 1

## 2018-10-22 MED ORDER — AMLODIPINE BESYLATE 10 MG PO TABS
10.0000 mg | ORAL_TABLET | Freq: Every day | ORAL | Status: DC
  Filled 2018-10-22: qty 1

## 2018-10-22 MED ORDER — LORATADINE 10 MG PO TABS
10.0000 mg | ORAL_TABLET | Freq: Every day | ORAL | Status: DC
  Filled 2018-10-22: qty 1

## 2018-10-22 NOTE — ED Provider Notes (Signed)
[facility] EMERGENCY DEPARTMENT Provider Note   CSN: [id number] Arrival date & time: 10/22/18  1529    History   Chief Complaint Chief Complaint  Patient presents with  . Palpitations    HPI [name] [name] is a 73 y.o. male.      Palpitations  Palpitations quality: "Heart pounding" Onset quality:  With exertion Duration:  6 days Timing:  Intermittent Progression:  Waxing and waning Context: not anxiety, not blood loss, not caffeine and not nicotine   Relieved by:  None tried Worsened by:  Nothing Ineffective treatments:  None tried Associated symptoms: lower extremity edema   Associated symptoms: no back pain, no chest pain, no chest pressure, no cough, no nausea, no numbness, no shortness of breath, no vomiting and no weakness   Risk factors: heart disease and hx of atrial fibrillation     Past Medical History:  Diagnosis Date  . Abnormal LFTs (liver function tests)   . Anxiety   . Aortic root dilatation ([place])    a. 12/2015 Echo: mod dil of sinus of valsalva and Ao root (58m).  b. 53 mm Echo 2018  . BPH with urinary obstruction   . Bradycardia   . 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 CAD (coronary artery disease)    a. 12/2015 NSTEMI/Cath (in setting of PAF):  LM nl, LAD 30p,  LCX 335mRCA  ok, AM 100%, RPDA1 40, RPDA 2 60 ->Med Rx.  . Childhood asthma   . Chronic diastolic CHF (congestive heart failure) ([facility]CH[place]   a. 12/2015 Echo: EF 50-55%, mod AI, mod Ao root dil, mild MR, mod dil LA, mod RA.  . Marland KitchenOPD (chronic obstructive pulmonary disease) (HCSabana [place]   pt denies at preop  . Diabetes mellitus type II    diet controlled  . History of syncope   . Hyperlipidemia   . Hypertensive heart disease   . Kidney lump 04/04/2010   Overview:  Renal Cell Carcinoma   . Moderate aortic insufficiency    a. 12/2015 Echo: Mod AI.  . Marland Kitchenaroxysmal atrial fibrillation (HCRut[place]   a. 12/2015  started on Xarelto (CHA2DS2VASc = 4-5).  . Pneumonia   . Prostate cancer (HCEast [place]  . Renal cell carcinoma (HCLinn [place]  . Sleep apnea    Does not like  CPAP  . Spinal stenosis in cervical region 10/12/2016    Patient Active Problem List   Diagnosis Date Noted  . Chest pain 10/22/2018  . Counseling regarding advance care planning and goals of care 06/22/2018  . AKI (acute kidney injury) (HCPalmyra  . Anemia   . Sepsis (HCJuno Ridge10/12/2017  . Acute lower UTI 06/05/2018  . Colon polyp 05/21/2018  . Aortic valve regurgitation 04/14/2018  . Medication management 04/14/2018  . Thoracic aortic aneurysm without rupture (HCEdison01/28/2019  . 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 Head trauma, subsequent encounter 10/12/2016  . 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 Spinal stenosis in cervical region 10/12/2016  . Cervical spondylosis 10/12/2016  . Syncope and collapse 10/12/2016  . Fall   . Syncope 10/11/2016  . CAD (coronary artery disease)   . Chronic diastolic CHF (congestive heart failure) (HCHighland  . Hyperlipidemia   . Hypertensive heart disease   . Paroxysmal atrial fibrillation (HCFarmersville04/16/2017  . Multiple myeloma (HCLecompte  12/17/2015  . Mild intermittent asthma 10/18/2015  . Aortic root dilatation ([place]) 10/17/2015  . Diabetes ([place]) 10/17/2015  . 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 Hand paresthesia 12/09/2014  . Elevated prostate specific antigen (PSA) 07/12/2014  . Allergic rhinitis 07/12/2014  . LBP (low back pain) 07/12/2014  . Palpitation 07/11/2014  . Patient's other noncompliance with medication regimen 08/10/2013  . Chronic venous insufficiency 07/23/2012  . Obstructive apnea 04/11/2011  . Essential (primary) hypertension 09/07/2010  . 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 Anxiety, generalized 04/04/2010  . Cardiac conduction disorder  02/27/2010  . Benign prostatic hyperplasia without urinary obstruction 09/13/2009  . Abnormal findings on examination of genitourinary organs 08/09/2009  . Hereditary and idiopathic neuropathy 07/05/2009    Past Surgical History:  Procedure Laterality Date  . BACK SURGERY    . CARDIAC CATHETERIZATION N/A 12/18/2015   Procedure: Left Heart Cath and Coronary Angiography;  Surgeon: Leonie Man, MD;  Location: [facility];  Service: Cardiovascular;  Laterality: N/A;  . DIAGNOSTIC LAPAROSCOPY     partial colectomy  . LAPAROSCOPIC PARTIAL COLECTOMY Right 05/21/2018   Procedure: LAPAROSCOPIC RIGHT  COLECTOMY ERAS PATHWAY;  Surgeon: Leighton Ruff, MD;  [facility];  Service: General;  Laterality: Right;  . [place]   "replaced a disc"        Home Medications    Prior to Admission medications   Medication Sig Start Date End Date Taking? Authorizing Provider  albuterol (PROVENTIL) (2.5 MG/3ML) 0.083% nebulizer solution Take 3 mLs (2.5 mg total) by nebulization every 4 (four) hours as needed for wheezing or shortness of breath. 09/02/18  Yes Delo, Nathaneil Canary, MD  amLODipine (NORVASC) 10 MG tablet Take 1 tablet (10 mg total) by mouth daily. 09/07/18  Yes Scot Jun, FNP  atorvastatin (LIPITOR) 80 MG tablet Take 1 tablet (80 mg total) by mouth daily. 09/07/18 12/06/18 Yes Scot Jun, FNP  cetirizine (ZYRTEC) 10 MG tablet Take 10 mg by mouth daily.   Yes [provider]  ferrous sulfate (FERROUSUL) 325 (65 FE) MG tablet Take 1 tablet (325 mg total) by mouth 2 (two) times daily with a meal. 10/06/18  Yes Scot Jun, FNP  fluticasone (FLONASE) 50 MCG/ACT nasal spray Place 2 sprays into both nostrils daily. 08/18/18  Yes [provider]  ipratropium (ATROVENT) 0.03 % nasal spray Place 2 sprays into both nostrils at bedtime. 10/06/18  Yes Scot Jun, FNP  potassium chloride SA (K-DUR,KLOR-CON) 20 MEQ tablet Take 20 mEq by mouth 2 (two)  times daily.   Yes [provider]  tamsulosin (FLOMAX) 0.4 MG CAPS capsule Take 1 capsule (0.4 mg total) by mouth daily. 11[birth year]53  Yes Leighton Ruff, MD  blood glucose meter kit and supplies KIT Dispense based on patient and insurance preference. Use up to four times daily as directed. (FOR ICD-10 E11.69). 09/07/18   Scot Jun, FNP  glucose blood test strip Check blood sugar twice per day. E.11.9 10/06/18   Scot Jun, FNP    Family History Family History  Problem Relation Age of Onset  . Emphysema Father   . Asthma Father   . Liver disease Father        tumor  . Heart disease Mother   . Hypertension Mother   . Asthma Sister   . Hypertension Son     Social History Social History   Tobacco Use  . Smoking status: Former Smoker  Types: Cigars    Last attempt to quit: 09/02/1977    Years since quitting: 41.1  . Smokeless tobacco: Never Used  . Tobacco comment:    Substance Use Topics  . Alcohol use: Not Currently    Comment: "stopped drinking alcohol in ~ 1980; just drank a little on the weekends when I did drink"  . Drug use: No     Allergies   Amoxicillin and Penicillins   Review of Systems Review of Systems  Constitutional: Negative for chills and fever.  HENT: Negative for ear pain and sore throat.   Eyes: Negative for pain and visual disturbance.  Respiratory: Negative for cough and shortness of breath.   Cardiovascular: Positive for palpitations. Negative for chest pain.  Gastrointestinal: Negative for abdominal pain, nausea and vomiting.  Genitourinary: Negative for dysuria and hematuria.  Musculoskeletal: Negative for arthralgias and back pain.  Skin: Negative for color change and rash.  Neurological: Negative for seizures, syncope, weakness and numbness.  All other systems reviewed and are negative.    Physical Exam Updated Vital Signs BP (!) 161/93   Pulse (!) 46   Temp 97.6 F (36.4 C) (Oral)   Resp 18   Ht 5' 6"  (1.676 m)    Wt 96.5 kg   SpO2 99%   BMI 34.35 kg/m   Physical Exam Vitals signs and nursing note reviewed.  Constitutional:      Appearance: He is well-developed.     Comments: Patient resting comfortably, no acute distress.  HENT:     Head: Normocephalic and atraumatic.  Eyes:     Conjunctiva/sclera: Conjunctivae normal.  Neck:     Musculoskeletal: Neck supple.  Cardiovascular:     Rate and Rhythm: Normal rate and regular rhythm.     Heart sounds: Murmur present. Diastolic murmur present with a grade of 2/4. No friction rub. No gallop.   Pulmonary:     Effort: Pulmonary effort is normal. No respiratory distress.     Breath sounds: Normal breath sounds.  Abdominal:     Palpations: Abdomen is soft.     Tenderness: There is no abdominal tenderness.  Musculoskeletal: Normal range of motion.  Skin:    General: Skin is warm and dry.     Capillary Refill: Capillary refill takes less than 2 seconds.  Neurological:     General: No focal deficit present.     Mental Status: He is alert.  Psychiatric:        Mood and Affect: Mood normal.      ED Treatments / Results  Labs (all labs ordered are listed, but only abnormal results are displayed) Labs Reviewed  BASIC METABOLIC PANEL - Abnormal; Notable for the following components:      Result Value   Potassium 3.4 (*)    Glucose, Bld 131 (*)    BUN 27 (*)    Creatinine, Ser 1.56 (*)    GFR calc non Af Amer 43 (*)    GFR calc Af Amer 50 (*)    All other components within normal limits  CBC - Abnormal; Notable for the following components:   RBC 3.25 (*)    Hemoglobin 10.3 (*)    HCT 31.4 (*)    All other components within normal limits  TSH  TROPONIN I  TROPONIN I  TROPONIN I  I-STAT TROPONIN, ED    EKG EKG Interpretation  Date/Time:  Thursday October 22 2018 15:42:02 EST Ventricular Rate:  54 PR Interval:  192 QRS Duration: 114  QT Interval:  444 QTC Calculation: 421 R Axis:   -71 Text Interpretation:  Sinus  bradycardia Left anterior fascicular block Septal infarct , age undetermined ST & T wave abnormality, consider inferolateral ischemia Abnormal ECG Since prior ECG, TW inversions are new Confirmed by Gareth Morgan [phone]) on 10/22/2018 5:26:19 PM   Radiology Dg Chest 2 View  Result Date: 10/22/2018 CLINICAL DATA:  Acute onset chest pain, shortness of breath, fatigue and sinonasal congestion. Patient recently treated for pneumonia. Current history of multiple myeloma. EXAM: CHEST - 2 VIEW COMPARISON:  09/09/2018 and earlier, including PET-CT 08/19/2018. FINDINGS: Cardiac silhouette markedly enlarged, unchanged. Thoracic aorta tortuous and mildly atherosclerotic, unchanged. Hilar and mediastinal contours otherwise unremarkable. Lungs clear. Bronchovascular markings normal. Pulmonary vascularity normal. No visible pleural effusions. No pneumothorax. Degenerative changes involving the thoracic spine. Small metallic foreign body in the subcutaneous tissues of the UPPER ANTERIOR chest wall as noted previously. IMPRESSION: Stable marked cardiomegaly.  No acute cardiopulmonary disease. Electronically Signed   By: Evangeline Dakin M.D.   On: 10/22/2018 16:40    Procedures Procedures (including critical care time)  Medications Ordered in ED Medications  sodium chloride flush (NS) 0.9 % injection 3 mL (3 mLs Intravenous Not Given 10/22/18 1705)  amLODipine (NORVASC) tablet 10 mg (has no administration in time range)  atorvastatin (LIPITOR) tablet 80 mg (has no administration in time range)  tamsulosin (FLOMAX) capsule 0.4 mg (0.4 mg Oral Given 10/22/18 2254)  ferrous sulfate tablet 325 mg (has no administration in time range)  loratadine (CLARITIN) tablet 10 mg (has no administration in time range)  acetaminophen (TYLENOL) tablet 650 mg (has no administration in time range)  ondansetron (ZOFRAN) injection 4 mg (has no administration in time range)  heparin injection 5,000 Units (5,000 Units Subcutaneous  Given 10/22/18 2250)  hydrALAZINE (APRESOLINE) injection 5 mg (5 mg Intravenous Given 10/22/18 2250)  aspirin EC tablet 81 mg (81 mg Oral Given 10/22/18 2250)  aspirin chewable tablet 324 mg (324 mg Oral Given 10/22/18 1803)     Initial Impression / Assessment and Plan / ED Course  I have reviewed the triage vital signs and the nursing notes.  Pertinent labs & imaging results that were available during my care of the patient were reviewed by me and considered in my medical decision making (see chart for details).        73 y.o. male history of CKD, CAD, CHF, hyperlipidemia, paroxysmal A. Fib, and actively bing treated for multiple myeloma with metastatic disease presenting today for evaluation of worsening shortness of breath on exertion as well as a sensation of feeling like his heart is pounding however denies what he said is palpitations.  Patient also denies any chest pain.  Otherwise hemodynamically stable however is bradycardic into the mid 40s and 50s on arrival.  Concern for ACS, valvular abnormality, symptomatic bradycardia, worsening heart failure and other cardiopulmonary etiologies.  Will obtain basic laboratory studies, chest x-ray, EKG. EKG shows no signs of ST elevation or depression however patient has new T wave inversion in the inferior leads including V4, V5, V6 as well as aVR, 2, 3, with sinus bradycardia.  Laboratory studies appropriate, initial troponin negative.  Patient given aspirin.  Chest x-ray reviewed, using decision-making regarding management.  Will admit for continued work-up of new EKG changes, bradycardia, symptomatic exertional dyspnea.  Patient in agreement with this plan.  Patient admitted in stable condition with stable vital signs.  The above care was discussed and agreed upon by my attending physician.  Final Clinical Impressions(s) / ED Diagnoses   Final diagnoses:  Bradycardia    ED Discharge Orders    None       Orson Aloe,  MD 10/22/18 1660    Gareth Morgan, MD 10/27/18 [phone]

## 2018-10-22 NOTE — ED Notes (Signed)
Attempted report x1. 

## 2018-10-22 NOTE — ED Triage Notes (Signed)
Pt arrives POV for eval of palpitations w/ CP and SOB x 3 weeks. Pt reports he "just wanted to get it checked out to be sure". Pt is a poor historian states he has chest pain/pressure, then when asked further about CP, pt reports "no I don't have none of that". Pt denies N/V, LOC.

## 2018-10-22 NOTE — H&P (Signed)
TRH H&P   Patient Demographics:    [name] [name], is a 73 y.o. male  MRN: [id number]   DOB - 1945/12/29  Admit Date - 10/22/2018  Outpatient Primary MD for the patient is Scot Jun, FNP  Referring MD/NP/PA: Dr Magdalene Molly  Outpatient Specialists: Cards Dr Telford Nab  Patient coming from: Home  Chief Complaint  Patient presents with  . Palpitations      HPI:    [name] [name]  is a 73 y.o. male, with history of CAD, hyperlipidemia, BPH with urinary obstruction requiring Foley catheter in the past, hypertension, PAF not on anticoagulation,  right hemicolectomy for a cecal mass, light chain multiple myeloma, recent work-up positive for left kidney malignancy, presents with complaints of chest pain, reports chest pain over the last 24 hours, midsternal, pressure quality, nonradiating, no dyspnea or palpitation or diaphoresis with it, resolved by rest, patient with known history of CAD. -In ED troponins were negative, patient was chest pain-free, but he was noted to have ST changes in inferior lateral leads, blood pressure as well was uncontrolled, his creatinine was elevated at 1.5 which is his baseline, globin 10.3 which is his baseline as well, I was called to admit for further evaluation.  Patient with known history of light chain multiple myeloma, following with oncology as an outpatient, and with recent work-up for positive PET scan for left carcinoma.    Review of systems:    In addition to the HPI above,  No Fever-chills, No Headache, No changes with Vision or hearing, No problems swallowing food or Liquids, Planes of chest pain, no cough or Shortness of Breath, No Abdominal pain, No Nausea or Vommitting, Bowel movements are regular, No Blood in stool or Urine, No dysuria, No new skin rashes or bruises, No new joints pains-aches,  No new weakness, tingling, numbness in any  extremity, No recent weight gain or loss, No polyuria, polydypsia or polyphagia, No significant Mental Stressors.  A full 10 point Review of Systems was done, except as stated above, all other Review of Systems were negative.   With Past History of the following :    Past Medical History:  Diagnosis Date  . Abnormal LFTs (liver function tests)   . Anxiety   . Aortic root dilatation ([place])    a. 12/2015 Echo: mod dil of sinus of valsalva and Ao root (72m).  b. 53 mm Echo 2018  . BPH with urinary obstruction   . Bradycardia   . 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 CAD (coronary artery disease)    a. 12/2015 NSTEMI/Cath (in setting of PAF):  LM nl, LAD 30p,  LCX 319mRCA  ok, AM 100%, RPDA1 40, RPDA 2 60 ->Med Rx.  . Childhood asthma   . Chronic diastolic CHF (congestive heart failure) ([facility]  a. 12/2015 Echo: EF 50-55%, mod AI, mod Ao root dil, mild MR, mod dil LA, mod RA.  [name] COPD (chronic obstructive pulmonary disease) ([place])    pt denies at preop  . Diabetes mellitus type II    diet controlled  . History of syncope   . Hyperlipidemia   . Hypertensive heart disease   . Kidney lump 04/04/2010   Overview:  Renal Cell Carcinoma   . Moderate aortic insufficiency    a. 12/2015 Echo: Mod AI.  [name] Paroxysmal atrial fibrillation ([place])    a. 12/2015 started on Xarelto (CHA2DS2VASc = 4-5).  . Pneumonia   . Prostate cancer ([place])   . Renal cell carcinoma ([place])   . Sleep apnea    Does not like  CPAP  . Spinal stenosis in cervical region 10/12/2016      Past Surgical History:  Procedure Laterality Date  . BACK SURGERY    . CARDIAC CATHETERIZATION N/A 12/18/2015   Procedure: Left Heart Cath and Coronary Angiography;  Surgeon: Leonie Man, MD;  Location: [facility];  Service: Cardiovascular;  Laterality: N/A;  . DIAGNOSTIC LAPAROSCOPY     partial colectomy  . LAPAROSCOPIC PARTIAL COLECTOMY Right  05/21/2018   Procedure: LAPAROSCOPIC RIGHT  COLECTOMY ERAS PATHWAY;  Surgeon: Leighton Ruff, MD;  Location: [facility];  Service: General;  Laterality: Right;  . [place]   "replaced a disc"      Social History:     Social History   Tobacco Use  . Smoking status: Former Smoker    Types: Cigars    Last attempt to quit: 09/02/1977    Years since quitting: 41.1  . Smokeless tobacco: Never Used  . Tobacco comment:    Substance Use Topics  . Alcohol use: Not Currently    Comment: "stopped drinking alcohol in ~ 1980; just drank a little on the weekends when I did drink"     Lives -at home  Mobility -independent     Family History :     Family History  Problem Relation Age of Onset  . Emphysema Father   . Asthma Father   . Liver disease Father        tumor  . Heart disease Mother   . Hypertension Mother   . Asthma Sister   . Hypertension Son      Home Medications:   Prior to Admission medications   Medication Sig Start Date End Date Taking? Authorizing Provider  albuterol (PROVENTIL) (2.5 MG/3ML) 0.083% nebulizer solution Take 3 mLs (2.5 mg total) by nebulization every 4 (four) hours as needed for wheezing or shortness of breath. 09/02/18  Yes Delo, Nathaneil Canary, MD  amLODipine (NORVASC) 10 MG tablet Take 1 tablet (10 mg total) by mouth daily. 09/07/18  Yes Scot Jun, FNP  atorvastatin (LIPITOR) 80 MG tablet Take 1 tablet (80 mg total) by mouth daily. 09/07/18 12/06/18 Yes Scot Jun, FNP  cetirizine (ZYRTEC) 10 MG tablet Take 10 mg by mouth daily.   Yes [provider]  ferrous sulfate (FERROUSUL) 325 (65 FE) MG tablet Take 1 tablet (325 mg total) by mouth 2 (two) times daily with a meal. 10/06/18  Yes Scot Jun, FNP  fluticasone (FLONASE) 50 MCG/ACT nasal spray Place 2 sprays into both nostrils daily. 08/18/18  Yes [provider]  ipratropium (ATROVENT) 0.03 % nasal spray Place 2 sprays into both nostrils at bedtime. 10/06/18   Yes Scot Jun, FNP  potassium chloride  SA (K-DUR,KLOR-CON) 20 MEQ tablet Take 20 mEq by mouth 2 (two) times daily.   Yes [provider]  tamsulosin (FLOMAX) 0.4 MG CAPS capsule Take 1 capsule (0.4 mg total) by mouth daily. 02/24/62  Yes Leighton Ruff, MD  blood glucose meter kit and supplies KIT Dispense based on patient and insurance preference. Use up to four times daily as directed. (FOR ICD-10 E11.69). 09/07/18   Scot Jun, FNP  glucose blood test strip Check blood sugar twice per day. E.11.9 10/06/18   Scot Jun, FNP     Allergies:     Allergies  Allergen Reactions  . Amoxicillin Other (See Comments)    Tolerates Unasyn. Can't move Has patient had a PCN reaction causing immediate rash, facial/tongue/throat swelling, SOB or lightheadedness with hypotension: No Has patient had a PCN reaction causing severe rash involving mucus membranes or skin necrosis: No Has patient had a PCN reaction that required hospitalization No Has patient had a PCN reaction occurring within the last 10 years: No If all of the above answers are "NO", then may proceed with Cephalosporin use.   [name] Penicillins Other (See Comments)    Tolerates Unasyn. Can't move/dizziness Has patient had a PCN reaction causing immediate rash, facial/tongue/throat swelling, SOB or lightheadedness with hypotension: No Has patient had a PCN reaction causing severe rash involving mucus membranes or skin necrosis: No Has patient had a PCN reaction that required hospitalization No Has patient had a PCN reaction occurring within the last 10 years: No If all of the above answers are "NO", then may proceed with Cephalosporin use.  Other reaction(s): O     Physical Exam:   Vitals  Blood pressure (!) 168/89, pulse 76, temperature 98.7 F (37.1 C), temperature source Oral, resp. rate 19, height _0  (1.676 m), weight 100 kg, SpO2 98 %.   1. General obese male, sitting in bed in no apparent  distress  2. Normal affect and insight, Not Suicidal or Homicidal, Awake Alert, Oriented X 3.  3. No F.N deficits, ALL C.Nerves Intact, Strength 5/5 all 4 extremities, Sensation intact all 4 extremities, Plantars down going.  4. Ears and Eyes appear Normal, Conjunctivae clear, PERRLA. Moist Oral Mucosa.  5. Supple Neck, No JVD, No cervical lymphadenopathy appriciated, No Carotid Bruits.  6. Symmetrical Chest wall movement, Good air movement bilaterally, CTAB.  7. RRR, No Gallops, Rubs, + Murmurs, No Parasternal Heave.  8. Positive Bowel Sounds, Abdomen Soft, No tenderness, No organomegaly appriciated,No rebound -guarding or rigidity.  9.  No Cyanosis, Normal Skin Turgor, No Skin Rash or Bruise.  10. Good muscle tone,  joints appear normal , no effusions, Normal ROM.  11. No Palpable Lymph Nodes in Neck or Axillae     Data Review:    CBC Recent Labs  Lab 10/22/18 1608  WBC 4.6  HGB 10.3*  HCT 31.4*  PLT 192  MCV 96.6  MCH 31.7  MCHC 32.8  RDW 14.1   ------------------------------------------------------------------------------------------------------------------  Chemistries  Recent Labs  Lab 10/22/18 1608  NA 140  K 3.4*  CL 105  CO2 27  GLUCOSE 131*  BUN 27*  CREATININE 1.56*  CALCIUM 9.2   ------------------------------------------------------------------------------------------------------------------ estimated creatinine clearance is 46 mL/min (A) (by C-G formula based on SCr of 1.56 mg/dL (H)). ------------------------------------------------------------------------------------------------------------------ No results for input(s): TSH, T4TOTAL, T3FREE, THYROIDAB in the last 72 hours.  Invalid input(s): FREET3  Coagulation profile No results for input(s): INR, PROTIME in the last 168 hours. ------------------------------------------------------------------------------------------------------------------- No results for input(s):  DDIMER in the last  72 hours. -------------------------------------------------------------------------------------------------------------------  Cardiac Enzymes No results for input(s): CKMB, TROPONINI, MYOGLOBIN in the last 168 hours.  Invalid input(s): CK ------------------------------------------------------------------------------------------------------------------    Component Value Date/Time   BNP 36.2 09/09/2018 1834     ---------------------------------------------------------------------------------------------------------------  Urinalysis    Component Value Date/Time   COLORURINE STRAW (A) 06/07/2018 1643   APPEARANCEUR CLEAR 06/07/2018 1643   LABSPEC 1.008 06/07/2018 1643   PHURINE 6.0 06/07/2018 1643   GLUCOSEU NEGATIVE 06/07/2018 1643   HGBUR LARGE (A) 06/07/2018 1643   BILIRUBINUR NEGATIVE 06/07/2018 1643   KETONESUR NEGATIVE 06/07/2018 1643   PROTEINUR 30 (A) 06/07/2018 1643   NITRITE NEGATIVE 06/07/2018 1643   LEUKOCYTESUR MODERATE (A) 06/07/2018 1643    ----------------------------------------------------------------------------------------------------------------   Imaging Results:    Dg Chest 2 View  Result Date: 10/22/2018 CLINICAL DATA:  Acute onset chest pain, shortness of breath, fatigue and sinonasal congestion. Patient recently treated for pneumonia. Current history of multiple myeloma. EXAM: CHEST - 2 VIEW COMPARISON:  09/09/2018 and earlier, including PET-CT 08/19/2018. FINDINGS: Cardiac silhouette markedly enlarged, unchanged. Thoracic aorta tortuous and mildly atherosclerotic, unchanged. Hilar and mediastinal contours otherwise unremarkable. Lungs clear. Bronchovascular markings normal. Pulmonary vascularity normal. No visible pleural effusions. No pneumothorax. Degenerative changes involving the thoracic spine. Small metallic foreign body in the subcutaneous tissues of the UPPER ANTERIOR chest wall as noted previously. IMPRESSION: Stable marked cardiomegaly.  No  acute cardiopulmonary disease. Electronically Signed   By: Evangeline Dakin M.D.   On: 10/22/2018 16:40    My personal review of EKG: Rhythm NSR, Rate  54 /min, QTc 421 , acute ST changes and inferior lateral leads   Assessment & Plan:    Active Problems:   MGUS (monoclonal gammopathy of unknown significance)   Benign prostatic hyperplasia without urinary obstruction   Aortic root dilatation (HCC)   Diabetes (HCC)   Paroxysmal atrial fibrillation (HCC)   CAD (coronary artery disease)   Chronic diastolic CHF (congestive heart failure) (HCC)   Hyperlipidemia   Aortic valve regurgitation   Anemia   Chest pain   Chest pain -Some typical features, as provoked by exertion, relieved by risk, he will be admitted for further work-up, monitoring telemetry, will cycle troponins, he already received aspirin, I have placed cardiology consult evaluate tomorrow to see if appropriate to perform stress test during hospital stay especially with history of known coronary artery disease with total occlusion of acute marginal.  CAD -Continue with aspirin, statin, no beta-blocker in setting of bradycardia, no ACE/arm secondary to CKD -Wait further cardiology recommendation  History of paroxysmal A. Fib -Vascor of 3, apparently patient has declined anticoagulation in the past, will await further cardiology input, heart rate controlled  Renal CT carcinoma -As evident on CT admission, as well on PET scan, discussed at length with the patient, reports he is following with urology regarding BPH, reports he had another appointment scheduled for March 3, have discussed with the lab patient about importance of the follow-up with urology regarding these findings, as well with oncology, patient aware of his renal cell malignancy, and potential metastasis he does not follow-up, does not recall his urologist name, will have morning team discuss with [facility] to find his primary urologist and updated  him.  Hypertension -blooed pressure uncontrolled, continue with Norvasc, will add PRN hydralazine  Dilated aortic root/aortic valve regurgitation -Has been followed mostly by cardiology as an outpatient  Hyperlipidemia -Continue with home dose statin  History of light chain myeloma -he is following  with oncology Dr. Irene Limbo as an outpatient, on chemotherapy    anemia -Factorial likely secondary to light chain myeloma and CKD  BPH -Required Foley catheter in the past, continue with home meds  Chronic diastolic heart failure -Continue with home meds  CKD stage III -Renal function at baseline, avoid nephrotoxic medications  DVT Prophylaxis Heparin - SCDs  AM Labs Ordered, also please review Full Orders  Family Communication: Admission, patients condition and plan of care including tests being ordered have been discussed with the patient  who indicate understanding and agree with the plan and Code Status.  Code Status Full  Likely DC to  Home  Condition GUARDED   Consults called: None, email sent to cardiology to see in am  Admission status: Observation  Time spent in minutes : 65 minutes   Phillips Climes M.D on 10/22/2018 at 7:17 PM  Between 7am to 7pm - Pager - [phone]. After 7pm go to [email or web address] - password [email or web address]  [facility] - Office  [phone]

## 2018-10-22 NOTE — ED Notes (Signed)
Attempted report x 2 

## 2018-10-22 NOTE — ED Notes (Signed)
ED TO INPATIENT HANDOFF REPORT  Name/Age/Gender Darden Dates A Asmus 73 y.o. male  Code Status Code Status History    Date Active Date Inactive Code Status Order ID Comments User Context   06/05/2018 1255 06/17/2018 1329 Full Code [id number]  Albertine Patricia, MD Inpatient   05/21/2018 1219 05/26/2018 1429 Full Code [id number]  Leighton Ruff, MD Inpatient   10/11/2016 1846 10/13/2016 2209 Full Code [id number]  Carlyle Dolly, MD Inpatient   12/17/2015 0059 12/20/2015 1634 Full Code [id number]  Norval Morton, MD Inpatient   10/17/2015 1448 10/18/2015 1654 Full Code [id number]  Francesca Oman, DO Inpatient   11/15/2014 1350 11/16/2014 0335 Full Code [id number]  Aletta Edouard, MD HOV      Home/SNF/Other Home  Chief Complaint CP, SHOB  Level of Care/Admitting Diagnosis ED Disposition    ED Disposition Condition [place]: [place] [100100]  Level of Care: Cardiac Telemetry [103]  I expect the patient will be discharged within 24 hours: Yes  LOW acuity---Tx typically complete <24 hrs---ACUTE conditions typically can be evaluated <24 hours---LABS likely to return to acceptable levels <24 hours---IS near functional baseline---EXPECTED to return to current living arrangement---NOT newly hypoxic: Meets criteria for 5C-Observation unit  Diagnosis: Chest pain [383818]  Admitting Physician: Manfred Shirts  Attending Physician: Waldron Labs, DAWOOD S [[id number]  PT Class (Do Not Modify): Observation [104]  PT Acc Code (Do Not Modify): Observation [10022]       Medical History Past Medical History:  Diagnosis Date  . Abnormal LFTs (liver function tests)   . Anxiety   . Aortic root dilatation ([place])    a. 12/2015 Echo: mod dil of sinus of valsalva and Ao root (24m).  b. 53 mm Echo 2018  . BPH with urinary obstruction   . Bradycardia   . 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 CAD (coronary artery disease)    a. 12/2015 NSTEMI/Cath (in setting of PAF):  LM nl, LAD 30p,  LCX 310mRCA  ok, AM 100%, RPDA1 40, RPDA 2 60 ->Med Rx.  . Childhood asthma   . Chronic diastolic CHF (congestive heart failure) ([place]   a. 12/2015 Echo: EF 50-55%, mod AI, mod Ao root dil, mild MR, mod dil LA, mod RA.  . Marland KitchenOPD (chronic obstructive pulmonary disease) ([facility]   pt denies at preop  . Diabetes mellitus type II    diet controlled  . History of syncope   . Hyperlipidemia   . Hypertensive heart disease   . Kidney lump 04/04/2010   Overview:  Renal Cell Carcinoma   . Moderate aortic insufficiency    a. 12/2015 Echo: Mod AI.  . Marland Kitchenaroxysmal atrial fibrillation ([place]   a. 12/2015 started on Xarelto (CHA2DS2VASc = 4-5).  . Pneumonia   . Prostate cancer ([facility]  . Renal cell carcinoma ([facility]  . Sleep apnea    Does not like  CPAP  . Spinal stenosis in cervical region 10/12/2016    Allergies Allergies  Allergen Reactions  . Amoxicillin Other (See Comments)    Tolerates Unasyn. Can't move Has patient had a PCN reaction causing immediate rash, facial/tongue/throat swelling, SOB or lightheadedness with hypotension: No Has patient had a PCN reaction causing severe rash involving mucus membranes or skin necrosis: No Has patient had a PCN reaction that required hospitalization No Has patient had a PCN reaction  occurring within the last 10 years: No If all of the above answers are "NO", then may proceed with Cephalosporin use.   [name] Penicillins Other (See Comments)    Tolerates Unasyn. Can't move/dizziness Has patient had a PCN reaction causing immediate rash, facial/tongue/throat swelling, SOB or lightheadedness with hypotension: No Has patient had a PCN reaction causing severe rash involving mucus membranes or skin necrosis: No Has patient had a PCN reaction that required hospitalization No Has patient had a PCN reaction occurring within the last 10 years: No If all of the above  answers are "NO", then may proceed with Cephalosporin use.  Other reaction(s): O    IV Location/Drains/Wounds Patient Lines/Drains/Airways Status   Active Line/Drains/Airways    Name:   Placement date:   Placement time:   Site:   Days:   Incision (Closed) 05/21/18 Abdomen Other (Comment)   05/21/18    0909     154   Incision (Closed) 06/05/18 Abdomen Mid   06/05/18    -     139   Incision (Closed) 06/12/18 Buttocks Right   06/12/18    1022     132   Wound / Incision (Open or Dehisced) 10/17/15 Diabetic ulcer Toe (Comment  which one) Right;Left   10/17/15    1600    Toe (Comment  which one)   1101          Labs/Imaging Results for orders placed or performed during the hospital encounter of 10/22/18 (from the past 48 hour(s))  Basic metabolic panel     Status: Abnormal   Collection Time: 10/22/18  4:08 PM  Result Value Ref Range   Sodium 140 135 - 145 mmol/L   Potassium 3.4 (L) 3.5 - 5.1 mmol/L   Chloride 105 98 - 111 mmol/L   CO2 27 22 - 32 mmol/L   Glucose, Bld 131 (H) 70 - 99 mg/dL   BUN 27 (H) 8 - 23 mg/dL   Creatinine, Ser 1.56 (H) 0.61 - 1.24 mg/dL   Calcium 9.2 8.9 - 10.3 mg/dL   GFR calc non Af Amer 43 (L) >60 mL/min   GFR calc Af Amer 50 (L) >60 mL/min   Anion gap 8 5 - 15    Comment: Performed at [facility], [address], [place], [place] [address]  CBC     Status: Abnormal   Collection Time: 10/22/18  4:08 PM  Result Value Ref Range   WBC 4.6 4.0 - 10.5 K/uL   RBC 3.25 (L) 4.22 - 5.81 MIL/uL   Hemoglobin 10.3 (L) 13.0 - 17.0 g/dL   HCT 31.4 (L) 39.0 - 52.0 %   MCV 96.6 80.0 - 100.0 fL   MCH 31.7 26.0 - 34.0 pg   MCHC 32.8 30.0 - 36.0 g/dL   RDW 14.1 11.5 - 15.5 %   Platelets 192 150 - 400 K/uL   nRBC 0.0 0.0 - 0.2 %    Comment: Performed at [facility], [place] [address] [place], [place] [address]  I-stat troponin, ED     Status: None   Collection Time: 10/22/18  4:19 PM  Result Value Ref Range   Troponin i, poc 0.01 0.00 - 0.08 ng/mL    Comment 3            Comment: Due to the release kinetics of cTnI, a negative result within the first hours of the onset of symptoms does not rule out myocardial infarction with certainty. If myocardial infarction  is still suspected, repeat the test at appropriate intervals.    Dg Chest 2 View  Result Date: 10/22/2018 CLINICAL DATA:  Acute onset chest pain, shortness of breath, fatigue and sinonasal congestion. Patient recently treated for pneumonia. Current history of multiple myeloma. EXAM: CHEST - 2 VIEW COMPARISON:  09/09/2018 and earlier, including PET-CT 08/19/2018. FINDINGS: Cardiac silhouette markedly enlarged, unchanged. Thoracic aorta tortuous and mildly atherosclerotic, unchanged. Hilar and mediastinal contours otherwise unremarkable. Lungs clear. Bronchovascular markings normal. Pulmonary vascularity normal. No visible pleural effusions. No pneumothorax. Degenerative changes involving the thoracic spine. Small metallic foreign body in the subcutaneous tissues of the UPPER ANTERIOR chest wall as noted previously. IMPRESSION: Stable marked cardiomegaly.  No acute cardiopulmonary disease. Electronically Signed   By: Evangeline Dakin M.D.   On: 10/22/2018 16:40    Pending Labs Unresulted Labs (From admission, onward)    Start     Ordered   10/22/18 1814  TSH  ONCE - STAT,   STAT     10/22/18 1813   Signed and Held  Troponin I - Now Then Q3H  Now then every 3 hours,   TIMED     Signed and Held          Vitals/Pain Today's Vitals   10/22/18 1800 10/22/18 1830 10/22/18 1845 10/22/18 1900  BP: (!) 158/86 (!) 180/96 (!) 168/89   Pulse: (!) 46 (!) 52 (!) 42 76  Resp: 15 12 14 19   Temp:      TempSrc:      SpO2: 97% 100% 96% 98%  Weight:      Height:      PainSc:        Isolation Precautions No active isolations  Medications Medications  sodium chloride flush (NS) 0.9 % injection 3 mL (3 mLs Intravenous Not Given 10/22/18 1705)  aspirin chewable tablet 324 mg (324 mg Oral  Given 10/22/18 1803)    Mobility walks

## 2018-10-23 ENCOUNTER — Encounter (HOSPITAL_COMMUNITY): Payer: Self-pay | Admitting: Physician Assistant

## 2018-10-23 DIAGNOSIS — R001 Bradycardia, unspecified: Secondary | ICD-10-CM | POA: Diagnosis not present

## 2018-10-23 DIAGNOSIS — R002 Palpitations: Secondary | ICD-10-CM | POA: Diagnosis not present

## 2018-10-23 DIAGNOSIS — I2510 Atherosclerotic heart disease of native coronary artery without angina pectoris: Secondary | ICD-10-CM | POA: Diagnosis not present

## 2018-10-23 DIAGNOSIS — I480 Paroxysmal atrial fibrillation: Secondary | ICD-10-CM | POA: Diagnosis not present

## 2018-10-23 DIAGNOSIS — N400 Enlarged prostate without lower urinary tract symptoms: Secondary | ICD-10-CM

## 2018-10-23 LAB — TROPONIN I
Troponin I: <0.03 ng/mL (ref <0.03)
Troponin I: <0.03 ng/mL (ref <0.03)

## 2018-10-23 NOTE — Consult Note (Addendum)
Cardiology Consultation:   Patient ID: [name] [name]; [id number]; 08-14-1946   Admit date: 10/22/2018 Date of Consult: 10/23/2018  Primary Care Provider: Scot Jun, FNP Primary Cardiologist: Minus Breeding, MD Primary Electrophysiologist:  None  Chief Complaint: chest pounding  Patient Profile:   [name] [name] is a 73 y.o. male with a hx of light chain myeloma, recent imaging concerning for L kidney malignancy (? Renal cell CA per chart), moderate CAD by cath 12/2015, PAF, venous insufficiency, CKD stage III by labs, chronic appearing anemia, bradycardia, dilated aortic root and ascending aortic aneurysm (4.6cm 05/2018 with f/u due 11/2018), possible cirrhosis by CT 05/2018, colon CA s/p colon resection 10/8411, chronic diastolic CHF, mild AI, COPD, DM, HLD, prostate CA, spinal stenosis, OSA (does not use CPAP) who is being seen today for the evaluation of chest pounding at the request of Dr. Rubye Beach.  It is important to note that despite colon CA resection in 05/2018, diagnosis of light chain myeloma in 06/2018, and left kidney mass concerning for kidney cancer, the patient has declined further management for these things. 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  History of Present Illness:   The patient remotely established care with Dr. Percival Spanish due to AI and dilated aortic root. In 12/2015 he was admitted with DOE, chest pressure and weakness and found to have rapid AF. His troponin was elevated up to 0.63. He underwent cath showing 100% acute marginal, 60% RPDA2, 40% RPDA1, 30% mid Cx, 30% prox LAD, medical therapy recommended. Last echo 05/2018 showed mod LVH, EF 55-60%, grade 2 DD, mild AI, mod dilated aortic root/ascending aorta, mild MR, severe LAE/RAE, mild PR. His echo also commented, "There is a large nondescript density that is best seen in the parasternal long axis view protruding into the RA. It is unclearas to if this  represents interference from a large aortic root aneurysm vs. RA mass. In the apical 5 and 4 chamber views this is noted again and in the 4 chamber view it appears to be in the RA with flow around it. Recommend Cardiac MRI for further delineation as to whether this is an acutal RA mass or represents her large aortic root aneurysm." CMRI was not done due to patient's intolerance of the machine but he had a CTA 05/2018 also to follow up his aneurysm showing 4.6cm, L renal mass, nodular liver contour suggesting cirrhosis, no mention of RA mass - Dr. Percival Spanish did not suggest any further workup from a cardiac standpoint but did advise further w/u of his renal mass. The patient previously took himself off Xarelto but was willing to revisit post-surgery per last OV 04/2018.  He has had extremely tumultuous course lately with admission 05/2018 for R colectomy for colon CA and 10/4-10/16 with sepsis, AKI on CKD with Cr>5, diagnosis of light chain myeloma, anemia. With regard to renal mass, he had PET scan confirming concern for primary renal malignancy although their phone note 10/02/18 outline grave concern that he's failed to return on multiple instances for appointments or treatments. When asked about this he deflects the question rather abruptly and indicates he is not interested in talking about it, that "what will happen will happen," and he has no plans to pursue treatment.  He walks regularly and typically normally feels good. However, 2-3 days before admission he felt his heart pounding more pronounced than usual. He is a poor historian so it's unclear  if this was fast or irregular. He denies any CP, SOB, edema, orthopnea or syncope. He states he thinks this happens when his metabolism changes or when his cholesterol levels in his blood go up. He reports compliance with meds. He never has revisited the conversation about anticoagulation but is not adverse. Due to the heart pounding sensation he came to the ER where  cardiac workup has been relatively uneventful. CXR yesterday showed stable marked cardiomegaly with no acute CP disease. Labs show negative troponins x 4, Cr 1.56 (c/w prior), Hgb 10.3 (slightly higher than prior), TSH wnl. Vitals reveal HR in the 40s-60s, frequently hypertensive BP, normal pulse ox. EKG shows sinus bradycardia with LAFB, TWI inferiorly and V5-V6 which have been seen intermittently on prior tracings (even back to 2017 prior to cath with anatomy as outlined above).  His present complaint is wanting to eat. He has refused his medicines until he gets food.  Past Medical History:  Diagnosis Date  . Abnormal CT of liver    a. nodular contour suggesting cirrhosis 05/2018.  [name] Abnormal LFTs (liver function tests)   . Anxiety   . Aortic root dilatation ([place])   . Ascending aortic aneurysm ([place])   . BPH with urinary obstruction   . Bradycardia   . 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 CAD (coronary artery disease)    a. 12/2015 NSTEMI/Cath (in setting of PAF):  LM nl, LAD 30p,  LCX 60m RCA  ok, AM 100%, RPDA1 40, RPDA 2 60 ->Med Rx.  . Childhood asthma   . Chronic diastolic CHF (congestive heart failure) ([place]    a. 12/2015 Echo: EF 50-55%, mod AI, mod Ao root dil, mild MR, mod dil LA, mod RA.  . CKD (chronic kidney disease), stage III ([place]   . Colon cancer ([place]   . COPD (chronic obstructive pulmonary disease) ([place]    pt denies at preop  . Diabetes mellitus type II    diet controlled  . History of syncope   . Hyperlipidemia   . Hypertensive heart disease   . Kidney lump 04/04/2010   Overview:  Renal Cell Carcinoma   . Light chain myeloma ([place]   . Moderate aortic insufficiency    a. 12/2015 Echo: Mod AI.  .Marland KitchenParoxysmal atrial fibrillation ([place]    a. 12/2015 started on Xarelto (CHA2DS2VASc = 4-5).  . Pneumonia   . Prostate cancer ([place]   . Renal cell carcinoma ([place]   . Sleep apnea    Does  not like  CPAP  . Spinal stenosis in cervical region 10/12/2016  . Venous insufficiency     Past Surgical History:  Procedure Laterality Date  . BACK SURGERY    . CARDIAC CATHETERIZATION N/A 12/18/2015   Procedure: Left Heart Cath and Coronary Angiography;  Surgeon: DLeonie Man MD;  [facility];  Service: Cardiovascular;  Laterality: N/A;  . DIAGNOSTIC LAPAROSCOPY     partial colectomy  . LAPAROSCOPIC PARTIAL COLECTOMY Right 05/21/2018   Procedure: LAPAROSCOPIC RIGHT  COLECTOMY ERAS PATHWAY;  Surgeon: TLeighton Ruff MD;  Location: [facility];  Service: General;  Laterality: Right;  . LRaemon  "replaced a disc"     Inpatient Medications: Scheduled Meds: . amLODipine  10 mg Oral Daily  . aspirin EC  81 mg Oral Daily  . atorvastatin  80 mg Oral Daily  . ferrous sulfate  325  mg Oral BID WC  . heparin  5,000 Units Subcutaneous Q8H  . loratadine  10 mg Oral Daily  . sodium chloride flush  3 mL Intravenous Once  . tamsulosin  0.4 mg Oral Daily   Continuous Infusions:  PRN Meds: acetaminophen, hydrALAZINE, ondansetron (ZOFRAN) IV  Home Meds: Prior to Admission medications   Medication Sig Start Date End Date Taking? Authorizing Provider  albuterol (PROVENTIL) (2.5 MG/3ML) 0.083% nebulizer solution Take 3 mLs (2.5 mg total) by nebulization every 4 (four) hours as needed for wheezing or shortness of breath. 09/02/18  Yes Delo, Nathaneil Canary, MD  amLODipine (NORVASC) 10 MG tablet Take 1 tablet (10 mg total) by mouth daily. 09/07/18  Yes Scot Jun, FNP  atorvastatin (LIPITOR) 80 MG tablet Take 1 tablet (80 mg total) by mouth daily. 09/07/18 12/06/18 Yes Scot Jun, FNP  cetirizine (ZYRTEC) 10 MG tablet Take 10 mg by mouth daily.   Yes [provider]  ferrous sulfate (FERROUSUL) 325 (65 FE) MG tablet Take 1 tablet (325 mg total) by mouth 2 (two) times daily with a meal. 10/06/18  Yes Scot Jun, FNP  fluticasone (FLONASE) 50 MCG/ACT  nasal spray Place 2 sprays into both nostrils daily. 08/18/18  Yes [provider]  ipratropium (ATROVENT) 0.03 % nasal spray Place 2 sprays into both nostrils at bedtime. 10/06/18  Yes Scot Jun, FNP  potassium chloride SA (K-DUR,KLOR-CON) 20 MEQ tablet Take 20 mEq by mouth 2 (two) times daily.   Yes [provider]  tamsulosin (FLOMAX) 0.4 MG CAPS capsule Take 1 capsule (0.4 mg total) by mouth daily. 01/17/99  Yes Leighton Ruff, MD  blood glucose meter kit and supplies KIT Dispense based on patient and insurance preference. Use up to four times daily as directed. (FOR ICD-10 E11.69). 09/07/18   Scot Jun, FNP  glucose blood test strip Check blood sugar twice per day. E.11.9 10/06/18   Scot Jun, FNP    Allergies:    Allergies  Allergen Reactions  . Amoxicillin Other (See Comments)    Tolerates Unasyn. Can't move Has patient had a PCN reaction causing immediate rash, facial/tongue/throat swelling, SOB or lightheadedness with hypotension: No Has patient had a PCN reaction causing severe rash involving mucus membranes or skin necrosis: No Has patient had a PCN reaction that required hospitalization No Has patient had a PCN reaction occurring within the last 10 years: No If all of the above answers are "NO", then may proceed with Cephalosporin use.   [name] Penicillins Other (See Comments)    Tolerates Unasyn. Can't move/dizziness Has patient had a PCN reaction causing immediate rash, facial/tongue/throat swelling, SOB or lightheadedness with hypotension: No Has patient had a PCN reaction causing severe rash involving mucus membranes or skin necrosis: No Has patient had a PCN reaction that required hospitalization No Has patient had a PCN reaction occurring within the last 10 years: No If all of the above answers are "NO", then may proceed with Cephalosporin use.  Other reaction(s): O    Social History:   Social History   Socioeconomic History  .  Marital status: Widowed    Spouse name: Not on file  . Number of children: Not on file  . Years of education: Not on file  . Highest education level: Not on file  Occupational History  . Not on file  Social Needs  . Financial resource strain: Not on file  . Food insecurity:    Worry: Not on file  Inability: Not on file  . Transportation needs:    Medical: Not on file    Non-medical: Not on file  Tobacco Use  . Smoking status: Former Smoker    Types: Cigars    Last attempt to quit: 09/02/1977    Years since quitting: 41.1  . Smokeless tobacco: Never Used  . Tobacco comment:    Substance and Sexual Activity  . Alcohol use: Not Currently    Comment: "stopped drinking alcohol in ~ 1980; just drank a little on the weekends when I did drink"  . Drug use: No  . Sexual activity: Not Currently  Lifestyle  . Physical activity:    Days per week: Not on file    Minutes per session: Not on file  . Stress: Not on file  Relationships  . Social connections:    Talks on phone: Not on file    Gets together: Not on file    Attends religious service: Not on file    Active member of club or organization: Not on file    Attends meetings of clubs or organizations: Not on file    Relationship status: Not on file  . Intimate partner violence:    Fear of current or ex partner: Not on file    Emotionally abused: Not on file    Physically abused: Not on file    Forced sexual activity: Not on file  Other Topics Concern  . Not on file  Social History Narrative   Lives alone.  Wife died in 2023-04-21.  Lives in apartment.      Family History:   The patient's family history includes Asthma in his father and sister; Emphysema in his father; Heart disease in his mother; Hypertension in his mother and son; Liver disease in his father.  ROS:  Please see the history of present illness.  All other ROS reviewed and negative.     Physical Exam/Data:   Vitals:   10/22/18 2245 10/23/18 0042 10/23/18 0418  10/23/18 0806  BP: (!) 161/93 (!) 163/88 127/79 (!) 159/90  Pulse:  (!) 51 (!) 53 (!) 57  Resp:  18 19 19   Temp:  98.6 F (37 C) 98 F (36.7 C) (!) 97.5 F (36.4 C)  TempSrc:  Oral Oral Oral  SpO2:  98% 97% 100%  Weight:   95.4 kg   Height:        Intake/Output Summary (Last 24 hours) at 10/23/2018 1112 Last data filed at 10/23/2018 0388 Gross per 24 hour  Intake 240 ml  Output 100 ml  Net 140 ml   Last 3 Weights 10/23/2018 10/22/2018 10/22/2018  Weight (lbs) 210 lb 4.8 oz 212 lb 12.8 oz 220 lb 7.4 oz  Weight (kg) 95.391 kg 96.525 kg 100 kg    Body mass index is 33.94 kg/m.  General: Well developed, well nourished AAM, in no acute distress. Head: Normocephalic, atraumatic, sclera non-icteric, no xanthomas, nares are without discharge. Poor dentition Neck: Negative for carotid bruits. JVD not elevated. Lungs: Clear bilaterally to auscultation without wheezes, rales, or rhonchi. Breathing is unlabored. Heart: RRR with S1 S2. No murmurs, rubs, or gallops appreciated. Abdomen: Soft, non-tender, non-distended with normoactive bowel sounds. No hepatomegaly. No rebound/guarding. No obvious abdominal masses. Msk:  Strength and tone appear normal for age. Extremities: No clubbing or cyanosis. Trace pedal edema.  Distal pedal pulses are 2+ and equal bilaterally. Neuro: Alert and oriented X 3. No facial asymmetry. No focal deficit. Moves all extremities spontaneously. Psych:  Responds to questions appropriately with a normal affect.  EKG:  The EKG was personally reviewed and demonstrates sinus bradycardia with LAFB, TWI inferiorly and V5-V6 which have been seen intermittently on prior tracings (even back to 2017 prior to cath with anatomy as outlined above).  Laboratory Data:  Chemistry Recent Labs  Lab 10/22/18 1608  NA 140  K 3.4*  CL 105  CO2 27  GLUCOSE 131*  BUN 27*  CREATININE 1.56*  CALCIUM 9.2  GFRNONAA 43*  GFRAA 50*  ANIONGAP 8    No results for input(s): PROT,  ALBUMIN, AST, ALT, ALKPHOS, BILITOT in the last 168 hours. Hematology Recent Labs  Lab 10/22/18 1608  WBC 4.6  RBC 3.25*  HGB 10.3*  HCT 31.4*  MCV 96.6  MCH 31.7  MCHC 32.8  RDW 14.1  PLT 192   Cardiac Enzymes Recent Labs  Lab 10/22/18 2145 10/23/18 0105 10/23/18 0408  TROPONINI <0.03 <0.03 <0.03    Recent Labs  Lab 10/22/18 1619  TROPIPOC 0.01    BNPNo results for input(s): BNP, PROBNP in the last 168 hours.  DDimer No results for input(s): DDIMER in the last 168 hours.  Radiology/Studies:  Dg Chest 2 View  Result Date: 10/22/2018 CLINICAL DATA:  Acute onset chest pain, shortness of breath, fatigue and sinonasal congestion. Patient recently treated for pneumonia. Current history of multiple myeloma. EXAM: CHEST - 2 VIEW COMPARISON:  09/09/2018 and earlier, including PET-CT 08/19/2018. FINDINGS: Cardiac silhouette markedly enlarged, unchanged. Thoracic aorta tortuous and mildly atherosclerotic, unchanged. Hilar and mediastinal contours otherwise unremarkable. Lungs clear. Bronchovascular markings normal. Pulmonary vascularity normal. No visible pleural effusions. No pneumothorax. Degenerative changes involving the thoracic spine. Small metallic foreign body in the subcutaneous tissues of the UPPER ANTERIOR chest wall as noted previously. IMPRESSION: Stable marked cardiomegaly.  No acute cardiopulmonary disease. Electronically Signed   By: Evangeline Dakin M.D.   On: 10/22/2018 16:40    Assessment and Plan:   1. Heart pounding - difficult to know what this represents as his EKG showed NSR while having this symptom. He has remote hx of PAF in 2017 but do not see clinical recurrence since that time. He is a poor historian. To [place] he denies any CP or SOB. Telemetry and EKG have been unrevealing. There was concern over STT changes but these appear to have been present on prior tracings even back to 2017. Troponins are negative despite 2-3 days of symptoms. (He had reported 3 weeks  to ED.) There is a possibility this could represent PAF although we are not seeing any clinical evidence of recurrence. He has no showed and cancelled his most recent visits to our office so we are hesitant to empirically resume anticoagulation until he is seen back in follow-up. We will plan to see him back in the office to review, as well as to decide if monitor is necessary. Avoid empiric AVN blocking agent due to baseline sinus bradycardia. Unfortunately if his malignancies remain untreated this will be the bigger issue.  2. HTN - follow BP with resumption of usual meds.  3. Hypokalemia - will defer plan for replacement to primary team.  4. PAF - as outlined above. It seems this happened in 2017 without clinical recurrence.  For questions or updates, please contact [place] Please consult [email or web address] for contact info under Cardiology/STEMI.    Signed, Charlie Pitter, PA-C  10/23/2018 11:12 AM   I have personally seen and examined this patient with Melina Copa, PA-C. I agree with  the assessment and plan as outlined above. He has history of CAD but has been non-compliant with medical therapy and follow up over the years. He is now admitted with c/o "chest pounding". No chest pain or dyspnea. He is very active and walks every day. Troponin negative. EKG shows sinus with inferior and inferolateral TWI, unchanged from old EKG.   My exam: General: Well developed, well nourished, NAD  HEENT: OP clear, mucus membranes moist  SKIN: warm, dry. No rashes. Neuro: No focal deficits  Musculoskeletal: Muscle strength 5/5 all ext  Psychiatric: Mood and affect normal  Neck: No JVD, no carotid bruits, no thyromegaly, no lymphadenopathy.  Lungs:Clear bilaterally, no wheezes, rhonci, crackles Cardiovascular: Regular rate and rhythm. No murmurs, gallops or rubs. Abdomen:Soft. Bowel sounds present. Non-tender.  Extremities: No lower extremity edema. Pulses are 2 + in the bilateral DP/PT.  Plan:  Palpitations: No arrhythmias seen on telemetry. He denies chest pain. EKG unchanged. No further cardiac workup is indicated. He can follow up with Dr. Percival Spanish.   Lauree Chandler 10/23/2018 11:44 AM

## 2018-10-23 NOTE — Progress Notes (Addendum)
F/u arranged with Kerin Ransom 3/5 at 11am. (care team APPs not available, but wanted to keep continuity at [place] office). Appt added to AVS and will also autopopulate.  Also to addend: reviewed CXR report with Dr. Angelena Form as it indicates marked cardiac enlargement. Interestingly CXR does not look much different from when this was called mild. Per his review, cardiac silhouette not felt to reflect any marked enlargement compared to prior - follow clinically. No signs/sx of pericardial effusion at present. We will see back in office as above. Also notified IM that no further IP w/u is suggested at this time. Dayna Dunn PA-C

## 2018-10-23 NOTE — Discharge Summary (Signed)
Physician Discharge Summary  [name] [name] ANV:[id number] DOB: 02-17-1946 DOA: 10/22/2018  PCP: Scot Jun, FNP  Admit date: 10/22/2018 Discharge date: 10/23/2018  Admitted From: Home Disposition: Home  Recommendations for Outpatient Follow-up:  1. Follow up with PCP in 1 week 2. Please obtain BMP/CBC in one week 3. Please follow up on the following pending results: None  Home Health: None Equipment/Devices: None  Discharge Condition: Stable CODE STATUS: Full code Diet recommendation: Heart healthy   Brief/Interim Summary:  Admission HPI written by Albertine Patricia, MD    HPI:   [name] [name]  is a 73 y.o. male, with history of CAD, hyperlipidemia, BPH with urinary obstruction requiring Foley catheter in the past, hypertension, PAF not on anticoagulation,  right hemicolectomy for a cecal mass, light chain multiple myeloma, recent work-up positive for left kidney malignancy, presents with complaints of chest pain, reports chest pain over the last 24 hours, midsternal, pressure quality, nonradiating, no dyspnea or palpitation or diaphoresis with it, resolved by rest, patient with known history of CAD. -In ED troponins were negative, patient was chest pain-free, but he was noted to have ST changes in inferior lateral leads, blood pressure as well was uncontrolled, his creatinine was elevated at 1.5 which is his baseline, globin 10.3 which is his baseline as well, I was called to admit for further evaluation.  Patient with known history of light chain multiple myeloma, following with oncology as an outpatient, and with recent work-up for positive PET scan for left carcinoma.    Hospital course:  Palpitations Patient today states episode was more feeling of flutter rather than pain. Completely resolved. Patient's EKG with new t-wave inversions. Troponin cycled and were negative overnight. No recurrent episodes. No significant telemetry findings. Cardiology evaluated and  recommended outpatient follow-up.  CAD Continue Lipitor. Not on an ACEi/ARB secondary to CKD  Paroxysmal atrial fibrillation Rate controlled. Per chart review, patient has declined anticoagulation in the past. Not on medication for rate control or anticoagulation.  Renal cell carcinoma Outpatient follow-up.  Essential hypertension Continued home amlodipine  Dilated aortic root Aortic valve regurgitation Outpatient follow-up  Hyperlipidemia Continue home Lipitor  Light chain myeloma Outpatient follow-up  CKD stage III Stable  Chronic diastolic heart failure Stable  Anemia Chronic. Stable.  BPH Continue home Flomax  Discharge Diagnoses:  Active Problems:   MGUS (monoclonal gammopathy of unknown significance)   Benign prostatic hyperplasia without urinary obstruction   Aortic root dilatation (HCC)   Diabetes (HCC)   Paroxysmal atrial fibrillation (HCC)   CAD (coronary artery disease)   Chronic diastolic CHF (congestive heart failure) (HCC)   Hyperlipidemia   Aortic valve regurgitation   Anemia   Chest pain    Discharge Instructions   Allergies as of 10/23/2018      Reactions   Amoxicillin Other (See Comments)   Tolerates Unasyn. Can't move Has patient had a PCN reaction causing immediate rash, facial/tongue/throat swelling, SOB or lightheadedness with hypotension: No Has patient had a PCN reaction causing severe rash involving mucus membranes or skin necrosis: No Has patient had a PCN reaction that required hospitalization No Has patient had a PCN reaction occurring within the last 10 years: No If all of the above answers are "NO", then may proceed with Cephalosporin use.   Penicillins Other (See Comments)   Tolerates Unasyn. Can't move/dizziness Has patient had a PCN reaction causing immediate rash, facial/tongue/throat swelling, SOB or lightheadedness with hypotension: No Has patient had a PCN reaction causing severe  rash involving mucus membranes or  skin necrosis: No Has patient had a PCN reaction that required hospitalization No Has patient had a PCN reaction occurring within the last 10 years: No If all of the above answers are "NO", then may proceed with Cephalosporin use. Other reaction(s): O      Medication List    TAKE these medications   albuterol (2.5 MG/3ML) 0.083% nebulizer solution Commonly known as:  PROVENTIL Take 3 mLs (2.5 mg total) by nebulization every 4 (four) hours as needed for wheezing or shortness of breath.   amLODipine 10 MG tablet Commonly known as:  NORVASC Take 1 tablet (10 mg total) by mouth daily.   atorvastatin 80 MG tablet Commonly known as:  LIPITOR Take 1 tablet (80 mg total) by mouth daily.   blood glucose meter kit and supplies Kit Dispense based on patient and insurance preference. Use up to four times daily as directed. (FOR ICD-10 E11.69).   cetirizine 10 MG tablet Commonly known as:  ZYRTEC Take 10 mg by mouth daily.   ferrous sulfate 325 (65 FE) MG tablet Commonly known as:  FERROUSUL Take 1 tablet (325 mg total) by mouth 2 (two) times daily with a meal.   fluticasone 50 MCG/ACT nasal spray Commonly known as:  FLONASE Place 2 sprays into both nostrils daily.   glucose blood test strip Check blood sugar twice per day. E.11.9   ipratropium 0.03 % nasal spray Commonly known as:  ATROVENT Place 2 sprays into both nostrils at bedtime.   potassium chloride SA 20 MEQ tablet Commonly known as:  K-DUR,KLOR-CON Take 20 mEq by mouth 2 (two) times daily.   tamsulosin 0.4 MG Caps capsule Commonly known as:  FLOMAX Take 1 capsule (0.4 mg total) by mouth daily.       Allergies  Allergen Reactions  . Amoxicillin Other (See Comments)    Tolerates Unasyn. Can't move Has patient had a PCN reaction causing immediate rash, facial/tongue/throat swelling, SOB or lightheadedness with hypotension: No Has patient had a PCN reaction causing severe rash involving mucus membranes or skin  necrosis: No Has patient had a PCN reaction that required hospitalization No Has patient had a PCN reaction occurring within the last 10 years: No If all of the above answers are "NO", then may proceed with Cephalosporin use.   [name] Penicillins Other (See Comments)    Tolerates Unasyn. Can't move/dizziness Has patient had a PCN reaction causing immediate rash, facial/tongue/throat swelling, SOB or lightheadedness with hypotension: No Has patient had a PCN reaction causing severe rash involving mucus membranes or skin necrosis: No Has patient had a PCN reaction that required hospitalization No Has patient had a PCN reaction occurring within the last 10 years: No If all of the above answers are "NO", then may proceed with Cephalosporin use.  Other reaction(s): O    Consultations:  Cardiology   Procedures/Studies: Dg Chest 2 View  Result Date: 10/22/2018 CLINICAL DATA:  Acute onset chest pain, shortness of breath, fatigue and sinonasal congestion. Patient recently treated for pneumonia. Current history of multiple myeloma. EXAM: CHEST - 2 VIEW COMPARISON:  09/09/2018 and earlier, including PET-CT 08/19/2018. FINDINGS: Cardiac silhouette markedly enlarged, unchanged. Thoracic aorta tortuous and mildly atherosclerotic, unchanged. Hilar and mediastinal contours otherwise unremarkable. Lungs clear. Bronchovascular markings normal. Pulmonary vascularity normal. No visible pleural effusions. No pneumothorax. Degenerative changes involving the thoracic spine. Small metallic foreign body in the subcutaneous tissues of the UPPER ANTERIOR chest wall as noted previously. IMPRESSION: Stable marked cardiomegaly.  No acute cardiopulmonary disease. Electronically Signed   By: Evangeline Dakin M.D.   On: 10/22/2018 16:40      Subjective: No issues overnight. No chest pain. No dyspnea.  Discharge Exam: Vitals:   10/23/18 0418 10/23/18 0806  BP: 127/79 (!) 159/90  Pulse: (!) 53 (!) 57  Resp: 19 19    Temp: 98 F (36.7 C) (!) 97.5 F (36.4 C)  SpO2: 97% 100%   Vitals:   10/22/18 2245 10/23/18 0042 10/23/18 0418 10/23/18 0806  BP: (!) 161/93 (!) 163/88 127/79 (!) 159/90  Pulse:  (!) 51 (!) 53 (!) 57  Resp:  18 19 19   Temp:  98.6 F (37 C) 98 F (36.7 C) (!) 97.5 F (36.4 C)  TempSrc:  Oral Oral Oral  SpO2:  98% 97% 100%  Weight:   95.4 kg   Height:        General: Pt is alert, awake, not in acute distress HEENT: exophthalmos Cardiovascular: RRR, S1/S2 +, no rubs, no gallops Respiratory: CTA bilaterally, no wheezing, no rhonchi Abdominal: Soft, NT, ND, bowel sounds + Extremities: no edema, no cyanosis    The results of significant diagnostics from this hospitalization (including imaging, microbiology, ancillary and laboratory) are listed below for reference.     Microbiology: No results found for this or any previous visit (from the past 240 hour(s)).   Labs: BNP (last 3 results) Recent Labs    09/09/18 1834  BNP 41.4   Basic Metabolic Panel: Recent Labs  Lab 10/22/18 1608  NA 140  K 3.4*  CL 105  CO2 27  GLUCOSE 131*  BUN 27*  CREATININE 1.56*  CALCIUM 9.2   Liver Function Tests: No results for input(s): AST, ALT, ALKPHOS, BILITOT, PROT, ALBUMIN in the last 168 hours. No results for input(s): LIPASE, AMYLASE in the last 168 hours. No results for input(s): AMMONIA in the last 168 hours. CBC: Recent Labs  Lab 10/22/18 1608  WBC 4.6  HGB 10.3*  HCT 31.4*  MCV 96.6  PLT 192   Cardiac Enzymes: Recent Labs  Lab 10/22/18 2145 10/23/18 0105 10/23/18 0408  TROPONINI <0.03 <0.03 <0.03   BNP: Invalid input(s): POCBNP CBG: No results for input(s): GLUCAP in the last 168 hours. D-Dimer No results for input(s): DDIMER in the last 72 hours. Hgb A1c No results for input(s): HGBA1C in the last 72 hours. Lipid Profile No results for input(s): CHOL, HDL, LDLCALC, TRIG, CHOLHDL, LDLDIRECT in the last 72 hours. Thyroid function studies Recent  Labs    10/22/18 1814  TSH 2.099   Anemia work up No results for input(s): VITAMINB12, FOLATE, FERRITIN, TIBC, IRON, RETICCTPCT in the last 72 hours. Urinalysis    Component Value Date/Time   COLORURINE STRAW (A) 06/07/2018 1643   APPEARANCEUR CLEAR 06/07/2018 1643   LABSPEC 1.008 06/07/2018 1643   PHURINE 6.0 06/07/2018 1643   GLUCOSEU NEGATIVE 06/07/2018 1643   HGBUR LARGE (A) 06/07/2018 1643   BILIRUBINUR NEGATIVE 06/07/2018 1643   KETONESUR NEGATIVE 06/07/2018 1643   PROTEINUR 30 (A) 06/07/2018 1643   NITRITE NEGATIVE 06/07/2018 1643   LEUKOCYTESUR MODERATE (A) 06/07/2018 1643    SIGNED:   Cordelia Poche, MD [facility] 10/23/2018, 9:27 AM

## 2018-10-23 NOTE — Discharge Instructions (Signed)
[name] [name],  You were in the hospital because of your palpitations. There were no harmful heart beating issues overnight while monitoring you. No evidence of an acute heart attack on your EKG or labs. The cardiologist would like you to follow-up with them as scheduled. Please also see your primary care physician.

## 2018-10-23 NOTE — Progress Notes (Signed)
Attempted to give his medication 2x. Initially pt indicated he will only take his medication when he has eaten for breakfast. He was NPO earlier. On second attempt, pt had indicated  He will just take his medications when he arrives home.

## 2018-11-04 ENCOUNTER — Ambulatory Visit (INDEPENDENT_AMBULATORY_CARE_PROVIDER_SITE_OTHER): Payer: Medicare Other

## 2018-11-04 ENCOUNTER — Ambulatory Visit (INDEPENDENT_AMBULATORY_CARE_PROVIDER_SITE_OTHER): Payer: Medicare Other | Admitting: Family Medicine

## 2018-11-04 ENCOUNTER — Encounter: Payer: Self-pay | Admitting: Family Medicine

## 2018-11-04 ENCOUNTER — Telehealth: Payer: Self-pay | Admitting: Family Medicine

## 2018-11-04 VITALS — BP 136/84 | HR 65 | Temp 97.7°F | Resp 18 | Ht 66.0 in | Wt 218.0 lb

## 2018-11-04 DIAGNOSIS — R002 Palpitations: Secondary | ICD-10-CM

## 2018-11-04 DIAGNOSIS — R062 Wheezing: Secondary | ICD-10-CM

## 2018-11-04 DIAGNOSIS — I272 Pulmonary hypertension, unspecified: Secondary | ICD-10-CM

## 2018-11-04 DIAGNOSIS — R001 Bradycardia, unspecified: Secondary | ICD-10-CM | POA: Diagnosis not present

## 2018-11-04 DIAGNOSIS — R05 Cough: Secondary | ICD-10-CM | POA: Diagnosis not present

## 2018-11-04 DIAGNOSIS — Z794 Long term (current) use of insulin: Secondary | ICD-10-CM | POA: Diagnosis not present

## 2018-11-04 DIAGNOSIS — J988 Other specified respiratory disorders: Secondary | ICD-10-CM

## 2018-11-04 DIAGNOSIS — R0989 Other specified symptoms and signs involving the circulatory and respiratory systems: Secondary | ICD-10-CM

## 2018-11-04 DIAGNOSIS — N183 Chronic kidney disease, stage 3 unspecified: Secondary | ICD-10-CM

## 2018-11-04 DIAGNOSIS — R0981 Nasal congestion: Secondary | ICD-10-CM

## 2018-11-04 DIAGNOSIS — E1169 Type 2 diabetes mellitus with other specified complication: Secondary | ICD-10-CM

## 2018-11-04 LAB — GLUCOSE, POCT (MANUAL RESULT ENTRY): POC Glucose: 112 mg/dl — AB (ref 70–99)

## 2018-11-04 MED ORDER — LEVOFLOXACIN 500 MG PO TABS: 500.0000 mg | ORAL_TABLET | Freq: Every day | ORAL | 0 refills | Status: DC

## 2018-11-04 MED ORDER — METHYLPREDNISOLONE SODIUM SUCC 125 MG IJ SOLR
125.0000 mg | Freq: Once | INTRAMUSCULAR | Status: AC
  Administered 2018-11-04: 125 mg via INTRAMUSCULAR

## 2018-11-04 NOTE — Telephone Encounter (Addendum)
Called patient to advise of finding of vascular hypertension/congestion on x-ray today. He is scheduled to follow-up with cardiology on tomorrow. Stressed the importance of follow-up.patient verbalized understanding and will seek ER evaluation if SOB worsens.

## 2018-11-04 NOTE — Progress Notes (Signed)
Patient ID: [name] [name], male    DOB: 1945/11/02, 73 y.o.   MRN: [id number]  PCP: [name] Jun, FNP  Chief Complaint  Patient presents with  . Diabetes    Subjective:  HPI  [name] [name] is a 73 y.o. male presents for hospital follow-up and shortness of breath.  has Palpitation; Elevated prostate specific antigen (PSA); Allergic rhinitis; Chronic venous insufficiency; Anxiety, generalized; ED (erectile dysfunction) of organic origin; MGUS (monoclonal gammopathy of unknown significance); LBP (low back pain); Abnormal findings on examination of genitourinary organs; Obstructive apnea; Cardiac conduction disorder; Hand paresthesia; Patient's other noncompliance with medication regimen; Essential (primary) hypertension; Hereditary and idiopathic neuropathy; Benign prostatic hyperplasia without urinary obstruction; Aortic root dilatation ([place]); Diabetes ([place]); Mild intermittent asthma; Paroxysmal atrial fibrillation ([place]); Multiple myeloma ([place]); CAD (coronary artery disease); Chronic diastolic CHF (congestive heart failure) ([place]); Hyperlipidemia; Hypertensive heart disease; Syncope; 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  Was admitted to [facility] on 10/22/2018 with a complaint of chest pain and shortness of breath.  Patient has a history of multiple myeloma and recent work-up which was significant for left renal carcinoma and patient has declined to receive any additional cancer  treatments at this time.  He was admitted overnight for observation.  Troponins were negative.  Blood pressure was elevated during admission however chest pain completely resolved and patient declined the sensation of any additional flutters or palpitations. Patient has a history of PAF however has never been anticoagulated.  He was discharged the following day to follow-up here in office were repeat CMP and CBC. She reports that he has followed up with urology yesterday. Unable to verify as [facility] is on a different system than our office.  However patient complains of over 1 week of gradually worsening shortness of breath and severe nasal congestion.  At a prior office visit patient was prescribed Atrovent nasal spray in addition she he was recommended to continue Flonase.  He was also advised to begin taking cetirizine. He reports compliance with all of my previous recommendation although he continues to have severe nasal congestion to the point he is unable to lie down to sleep at night.  He also endorses some productive cough and chest tightness as well as wheezing.  He is audibly wheezing during visit today. He reports he is not using his albuterol inhaler however he is giving himself nebulizer treatments at least twice per day every day this week.  Patient had pneumonia earlier in the year.  He reports that is how he feels now.  Denies feeling any palpitations or actual chest pains.  Has not noticed any swelling of his lower extremities.  He is requesting an ENT referral as he reports he has had an issue with nasal congestion for more than a year and no previous medication regimens have been effective in relieving the symptoms.  Social History   Socioeconomic History  . Marital status: Widowed    Spouse name: Not on file  . Number of children: Not on file  . Years of education: Not on file  . Highest  education level: Not on file  Occupational History  . Not on file  Social Needs  .  Financial resource strain: Not on file  . Food insecurity:    Worry: Not on file    Inability: Not on file  . Transportation needs:    Medical: Not on file    Non-medical: Not on file  Tobacco Use  . Smoking status: Former Smoker    Types: Cigars    Last attempt to quit: 09/02/1977    Years since quitting: 41.2  . Smokeless tobacco: Never Used  . Tobacco comment:    Substance and Sexual Activity  . Alcohol use: Not Currently    Comment: "stopped drinking alcohol in ~ 1980; just drank a little on the weekends when I did drink"  . Drug use: No  . Sexual activity: Not Currently  Lifestyle  . Physical activity:    Days per week: Not on file    Minutes per session: Not on file  . Stress: Not on file  Relationships  . Social connections:    Talks on phone: Not on file    Gets together: Not on file    Attends religious service: Not on file    Active member of club or organization: Not on file    Attends meetings of clubs or organizations: Not on file    Relationship status: Not on file  . Intimate partner violence:    Fear of current or ex partner: Not on file    Emotionally abused: Not on file    Physically abused: Not on file    Forced sexual activity: Not on file  Other Topics Concern  . Not on file  Social History Narrative   Lives alone.  Wife died in 2023/04/23.  Lives in apartment.      Family History  Problem Relation Age of Onset  . Emphysema Father   . Asthma Father   . Liver disease Father        tumor  . Heart disease Mother   . Hypertension Mother   . Asthma Sister   . Hypertension Son     Review of Systems Pertinent negatives listed in HPI   Prior to Admission medications   Medication Sig Start Date End Date Taking? Authorizing Provider  albuterol (PROVENTIL) (2.5 MG/3ML) 0.083% nebulizer solution Take 3 mLs (2.5 mg total) by nebulization every 4 (four) hours as needed for wheezing or shortness of breath. 09/02/18  Yes Delo, Nathaneil Canary, MD  amLODipine (NORVASC)  10 MG tablet Take 1 tablet (10 mg total) by mouth daily. 09/07/18  Yes [name] Jun, FNP  atorvastatin (LIPITOR) 80 MG tablet Take 1 tablet (80 mg total) by mouth daily. 09/07/18 12/06/18 Yes [name] Jun, FNP  blood glucose meter kit and supplies KIT Dispense based on patient and insurance preference. Use up to four times daily as directed. (FOR ICD-10 E11.69). 09/07/18  Yes [name] Jun, FNP  cetirizine (ZYRTEC) 10 MG tablet Take 10 mg by mouth daily.   Yes [provider]  ferrous sulfate (FERROUSUL) 325 (65 FE) MG tablet Take 1 tablet (325 mg total) by mouth 2 (two) times daily with a meal. 10/06/18  Yes [name] Jun, FNP  fluticasone (FLONASE) 50 MCG/ACT nasal spray Place 2 sprays into both nostrils daily. 08/18/18  Yes [provider]  glucose blood test strip Check blood sugar twice per day. E.11.9 10/06/18  Yes [name] Jun, FNP  ipratropium (ATROVENT) 0.03 % nasal spray Place  2 sprays into both nostrils at bedtime. 10/06/18  Yes [name] Jun, FNP  potassium chloride SA (K-DUR,KLOR-CON) 20 MEQ tablet Take 20 mEq by mouth 2 (two) times daily.   Yes [provider]  tamsulosin (FLOMAX) 0.4 MG CAPS capsule Take 1 capsule (0.4 mg total) by mouth daily. 1/82/99  Yes Leighton Ruff, MD    Past Medical, Surgical Family and Social History reviewed and updated.    Objective:   Today's Vitals   11/04/18 1416  BP: 136/84  Pulse: 65  Resp: 18  Temp: 97.7 F (36.5 C)  TempSrc: Oral  SpO2: 95%  Weight: 218 lb (98.9 kg)  Height: 5' 6" (1.676 m)    BP Readings from Last 3 Encounters:  11/04/18 136/84  10/23/18 (!) 159/90  10/06/18 118/76    Filed Weights   11/04/18 1416  Weight: 218 lb (98.9 kg)      Physical Exam Constitutional:      Appearance: He is not ill-appearing or diaphoretic.  HENT:     Head: Normocephalic.     Nose: Nasal tenderness and congestion present.     Right Turbinates: Enlarged and swollen.     Left  Turbinates: Enlarged and swollen.     Comments: Profoundly enlarged turbinates of the right nares.  Turbinate partially occluded.    Mouth/Throat:     Pharynx: Oropharynx is clear.  Cardiovascular:     Rate and Rhythm: Normal rate and regular rhythm.  Pulmonary:     Effort: No tachypnea, bradypnea, accessory muscle usage or respiratory distress.     Breath sounds: Wheezing and rhonchi present. No decreased breath sounds.  Musculoskeletal: Normal range of motion.  Skin:    General: Skin is warm and dry.  Neurological:     General: No focal deficit present.     Mental Status: He is alert.  Psychiatric:        Mood and Affect: Mood normal.    Lab Results  Component Value Date   POCGLU 112 (A) 11/04/2018   POCGLU 212 (A) 10/06/2018    Lab Results  Component Value Date   HGBA1C 5.4 09/07/2018      Assessment & Plan:  1. Type 2 diabetes mellitus with other specified complication, with long-term current use of insulin (HCC) Stable.  Glucose (CBG) 112, non fasting  2. Stage 3 chronic kidney disease ([place]) -Continue urology follow-up. This likely secondary to renal cancer.  3. Palpitation, resolved   4. Congestion of respiratory tract -Referring to ENT. Patient has profound swelling of the turbinates in the right nares.  Patient has complained of chronic congestion of the naris since he establish care back in January.  His symptoms have not improved with previous treatment.  On exam today his right nares is significantly enlarged and partially occluded which is a new finding from previous exam.  Referring patient to ENT.  Recommend continuing cetirizine.  Patient being treated with antibiotic therapy for presumptive pneumonia/bronchitis. Antibiotics may also serve to clear any underlying sinusitis infection if this is the cause of nasal edema.  5. Bradycardia, resolved. Will repeat labs per hospital discharge recommendations. - Basic metabolic panel - CBC with Differential  6.  Wheezing 7. Rhonchi at both lung bases Given recent pneumonia, obtain a chest x-ray.  Chest x-ray was performed during recent hospitalization which was negative of any acute findings.  Repeating today.  Will presumptively treat patient with Levaquin 500 mg once daily x10 days.  Patient also has a history significant for heart  failure he is to follow-up with cardiology on tomorrow.  Reinforced the importance of follow-up with cardiology as current symptoms could be related to heart disease. - DG Chest 2 View; Future - methylPREDNISolone sodium succinate (SOLU-MEDROL) 125 mg/2 mL injection 125 mg   8. Chronic nasal congestion - Ambulatory referral to ENT   Meds ordered this encounter  Medications  . levofloxacin (LEVAQUIN) 500 MG tablet    Sig: Take 1 tablet (500 mg total) by mouth daily.    Dispense:  10 tablet    Refill:  0  . methylPREDNISolone sodium succinate (SOLU-MEDROL) 125 mg/2 mL injection 125 mg      Molli Barrows, FNP [facility] [address] [place] [phone]: [phone]

## 2018-11-04 NOTE — Patient Instructions (Signed)
Bronchospasm, Adult    Bronchospasm is when airways in the lungs get smaller. When this happens, it can be hard to breathe. You may cough. You may also make a whistling sound when you breathe (wheeze).  Follow these instructions at home:  Medicines   Take over-the-counter and prescription medicines only as told by your doctor.   If you need to use an inhaler or nebulizer to take your medicine, ask your doctor how to use it.   If you were given a spacer, always use it with your inhaler.  Lifestyle   Change your heating and air conditioning filter. Do this at least once a month.   Try not to use fireplaces and wood stoves.   Do not  smoke. Do not  allow smoking in your home.   Try not to use things that have a strong smell, like perfume.   Get rid of pests (such as roaches and mice) and their poop.   Remove any mold from your home.   Keep your house clean. Get rid of dust.   Use cleaning products that have no smell.   Replace carpet with wood, tile, or vinyl flooring.   Use allergy-proof pillows, mattress covers, and box spring covers.   Wash bed sheets and blankets every week. Use hot water. Dry them in a dryer.   Use blankets that are made of polyester or cotton.   Wash your hands often.   Keep pets out of your bedroom.   When you exercise, try not to breathe in cold air.  General instructions   Have a plan for getting medical care. Know these things:  ? When to call your doctor.  ? When to call local emergency services (911 in the [place]  ? Where to go in an emergency.   Stay up to date on your shots (immunizations).   When you have an episode:  ? Stay calm.  ? Relax.  ? Breathe slowly.  Contact a doctor if:   Your muscles ache.   Your chest hurts.   The color of the mucus you cough up (sputum) changes from clear or white to yellow, green, gray, or bloody.   The mucus you cough up gets thicker.   You have a fever.  Get help right away if:   The whistling sound gets worse, even after you  take your medicines.   Your coughing gets worse.   You find it even harder to breathe.   Your chest hurts very much.  Summary   Bronchospasm is when airways in the lungs get smaller.   When this happens, it can be hard to breathe. You may cough. You may also make a whistling sound when you breathe.   Stay away from things that cause you to have episodes. These include smoke or dust.  This information is not intended to replace advice given to you by your health care provider. Make sure you discuss any questions you have with your health care provider.  Document Released: 06/16/2009 Document Revised: 08/22/2016 Document Reviewed: 08/22/2016  Elsevier Interactive Patient Education  2019 [employer]

## 2018-11-05 ENCOUNTER — Encounter: Payer: Self-pay | Admitting: Cardiology

## 2018-11-05 ENCOUNTER — Ambulatory Visit (INDEPENDENT_AMBULATORY_CARE_PROVIDER_SITE_OTHER): Payer: Medicare Other | Admitting: Cardiology

## 2018-11-05 VITALS — BP 156/84 | HR 58 | Ht 66.0 in | Wt 219.0 lb

## 2018-11-05 DIAGNOSIS — I2510 Atherosclerotic heart disease of native coronary artery without angina pectoris: Secondary | ICD-10-CM

## 2018-11-05 DIAGNOSIS — J441 Chronic obstructive pulmonary disease with (acute) exacerbation: Secondary | ICD-10-CM | POA: Diagnosis not present

## 2018-11-05 DIAGNOSIS — I5032 Chronic diastolic (congestive) heart failure: Secondary | ICD-10-CM

## 2018-11-05 DIAGNOSIS — R002 Palpitations: Secondary | ICD-10-CM | POA: Diagnosis not present

## 2018-11-05 DIAGNOSIS — I10 Essential (primary) hypertension: Secondary | ICD-10-CM

## 2018-11-05 LAB — BASIC METABOLIC PANEL
BUN / CREAT RATIO: 19 (ref 10–24)
BUN: 31 mg/dL — ABNORMAL HIGH (ref 8–27)
CHLORIDE: 104 mmol/L (ref 96–106)
CO2: 22 mmol/L (ref 20–29)
Calcium: 9.5 mg/dL (ref 8.6–10.2)
Creatinine, Ser: 1.62 mg/dL — ABNORMAL HIGH (ref 0.76–1.27)
GFR calc Af Amer: 48 mL/min/{1.73_m2} — ABNORMAL LOW (ref >59)
GFR calc non Af Amer: 41 mL/min/{1.73_m2} — ABNORMAL LOW (ref >59)
Glucose: 99 mg/dL (ref 65–99)
Potassium: 3.8 mmol/L (ref 3.5–5.2)
Sodium: 140 mmol/L (ref 134–144)

## 2018-11-05 LAB — CBC WITH DIFFERENTIAL/PLATELET
Basophils Absolute: 0 10*3/uL (ref 0.0–0.2)
Basos: 0 %
EOS (ABSOLUTE): 0.1 10*3/uL (ref 0.0–0.4)
Eos: 2 %
Hematocrit: 28 % — ABNORMAL LOW (ref 37.5–51.0)
Hemoglobin: 9.7 g/dL — ABNORMAL LOW (ref 13.0–17.7)
Immature Grans (Abs): 0 10*3/uL (ref 0.0–0.1)
Immature Granulocytes: 0 %
LYMPHS ABS: 1.5 10*3/uL (ref 0.7–3.1)
Lymphs: 29 %
MCH: 32.3 pg (ref 26.6–33.0)
MCHC: 34.6 g/dL (ref 31.5–35.7)
MCV: 93 fL (ref 79–97)
Monocytes Absolute: 0.4 10*3/uL (ref 0.1–0.9)
Monocytes: 8 %
Neutrophils Absolute: 3.1 10*3/uL (ref 1.4–7.0)
Neutrophils: 61 %
Platelets: 249 10*3/uL (ref 150–450)
RBC: 3 x10E6/uL — ABNORMAL LOW (ref 4.14–5.80)
RDW: 13.6 % (ref 11.6–15.4)
WBC: 5.2 10*3/uL (ref 3.4–10.8)

## 2018-11-05 NOTE — Assessment & Plan Note (Signed)
Repeat B/P by me 134/72

## 2018-11-05 NOTE — Assessment & Plan Note (Signed)
9/19- Echo: EF 55-60%, grade DD, mod AI, mod Ao root dil, mild MR, mod dil LA, mod RA.

## 2018-11-05 NOTE — Assessment & Plan Note (Signed)
Steroids and ABs added by PCP.

## 2018-11-05 NOTE — Patient Instructions (Signed)
Medication Instructions:  Your physician recommends that you continue on your current medications as directed. Please refer to the Current Medication list given to you today. If you need a refill on your cardiac medications before your next appointment, please call your pharmacy.   Lab work: NONE  If you have labs (blood work) drawn today and your tests are completely normal, you will receive your results only by: [name] MyChart Message (if you have MyChart) OR . A paper copy in the mail If you have any lab test that is abnormal or we need to change your treatment, we will call you to review the results.  Testing/Procedures: NONE   Follow-Up: At [facility], you and your health needs are our priority.  As part of our continuing mission to provide you with exceptional heart care, we have created designated Provider Care Teams.  These Care Teams include your primary Cardiologist (physician) and Advanced Practice Providers (APPs -  Physician Assistants and Nurse Practitioners) who all work together to provide you with the care you need, when you need it. You will need a follow up appointment in 6 months.  Please call our office 2 months in advance to schedule this appointment.  You may see Minus Breeding, MD or one of the following Advanced Practice Providers on your designated Care Team:   Rosaria Ferries, PA-C . Jory Sims, DNP, ANP  Any Other Special Instructions Will Be Listed Below (If Applicable).

## 2018-11-05 NOTE — Progress Notes (Signed)
Patient notified of results & recommendations. Expressed understanding.

## 2018-11-05 NOTE — Assessment & Plan Note (Signed)
12/2015 NSTEMI/Cath (in setting of PAF):  LM nl, LAD 30p,  LCX 58m, RCA  ok, AM 100%, RPDA1 40, RPDA 2 60 ->Med Rx.

## 2018-11-05 NOTE — Assessment & Plan Note (Signed)
Seen today for f/u of palpitations

## 2018-11-05 NOTE — Progress Notes (Signed)
11/05/2018 [name] [name]   04-09-1946  [id number]  Primary Physician Scot Jun, FNP Primary Cardiologist: Dr Percival Spanish  HPI: The patient is a pleasant 73 year old African-American male with a history of multiple medical problems.  He is not always been compliant with medications, he is "a man of God".  He was recently seen 10/23/2018 with palpitations.  He is a poor historian.  It is hard to tell if he had tachycardia or "hard beats".  When he was seen in consult at the hospital his EKG was unrevealing, he was in sinus rhythm.  His troponins were negative.  In the office today he is in sinus rhythm with a heart rate of 58.  He attributes this episode to COPD exacerbation.  He says once his primary care provider put him on steroids he felt much better and his heart "settle down".   Current Outpatient Medications  Medication Sig Dispense Refill  . albuterol (PROVENTIL) (2.5 MG/3ML) 0.083% nebulizer solution Take 3 mLs (2.5 mg total) by nebulization every 4 (four) hours as needed for wheezing or shortness of breath. 30 vial 0  . amLODipine (NORVASC) 10 MG tablet Take 1 tablet (10 mg total) by mouth daily. 90 tablet 1  . atorvastatin (LIPITOR) 80 MG tablet Take 1 tablet (80 mg total) by mouth daily. 90 tablet 3  . blood glucose meter kit and supplies KIT Dispense based on patient and insurance preference. Use up to four times daily as directed. (FOR ICD-10 E11.69). 1 each 0  . cetirizine (ZYRTEC) 10 MG tablet Take 10 mg by mouth daily.    . ferrous sulfate (FERROUSUL) 325 (65 FE) MG tablet Take 1 tablet (325 mg total) by mouth 2 (two) times daily with a meal. 60 tablet 3  . fluticasone (FLONASE) 50 MCG/ACT nasal spray Place 2 sprays into both nostrils daily.    [name] glucose blood test strip Check blood sugar twice per day. E.11.9 100 each 12  . ipratropium (ATROVENT) 0.03 % nasal spray Place 2 sprays into both nostrils at bedtime. 30 mL 0  . levofloxacin (LEVAQUIN) 500 MG tablet Take 1 tablet  (500 mg total) by mouth daily. 10 tablet 0  . potassium chloride SA (K-DUR,KLOR-CON) 20 MEQ tablet Take 20 mEq by mouth 2 (two) times daily.    . tamsulosin (FLOMAX) 0.4 MG CAPS capsule Take 1 capsule (0.4 mg total) by mouth daily. 30 capsule 0   No current facility-administered medications for this visit.     Allergies  Allergen Reactions  . Amoxicillin Other (See Comments)    Tolerates Unasyn. Can't move Has patient had a PCN reaction causing immediate rash, facial/tongue/throat swelling, SOB or lightheadedness with hypotension: No Has patient had a PCN reaction causing severe rash involving mucus membranes or skin necrosis: No Has patient had a PCN reaction that required hospitalization No Has patient had a PCN reaction occurring within the last 10 years: No If all of the above answers are "NO", then may proceed with Cephalosporin use.   [name] Penicillins Other (See Comments)    Tolerates Unasyn. Can't move/dizziness Has patient had a PCN reaction causing immediate rash, facial/tongue/throat swelling, SOB or lightheadedness with hypotension: No Has patient had a PCN reaction causing severe rash involving mucus membranes or skin necrosis: No Has patient had a PCN reaction that required hospitalization No Has patient had a PCN reaction occurring within the last 10 years: No If all of the above answers are "NO", then may proceed with Cephalosporin use.  Other reaction(s): O    Past Medical History:  Diagnosis Date  . Abnormal CT of liver    a. nodular contour suggesting cirrhosis 05/2018.  [name] Abnormal LFTs (liver function tests)   . Anxiety   . Aortic root dilatation ([place])   . Ascending aortic aneurysm ([place])   . BPH with urinary obstruction   . Bradycardia   . 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 CAD (coronary artery disease)    a. 12/2015 NSTEMI/Cath (in setting of PAF):  LM nl, LAD 30p,  LCX 63m  RCA  ok, AM 100%, RPDA1 40, RPDA 2 60 ->Med Rx.  . Childhood asthma   . Chronic diastolic CHF (congestive heart failure) ([place]    a. 12/2015 Echo: EF 50-55%, mod AI, mod Ao root dil, mild MR, mod dil LA, mod RA.  . CKD (chronic kidney disease), stage III ([place]   . Colon cancer ([place]   . COPD (chronic obstructive pulmonary disease) ([place]    pt denies at preop  . Diabetes mellitus type II    diet controlled  . History of syncope   . Hyperlipidemia   . Hypertensive heart disease   . Kidney lump 04/04/2010   Overview:  Renal Cell Carcinoma   . 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 Moderate aortic insufficiency    a. 12/2015 Echo: Mod AI.  .Marland KitchenParoxysmal atrial fibrillation ([place]    a. 12/2015 started on Xarelto (CHA2DS2VASc = 4-5).  . Pneumonia   . Prostate cancer ([facility]   . Renal cell carcinoma ([facility]   . Sleep apnea    Does not like  CPAP  . Spinal stenosis in cervical region 10/12/2016  . Venous insufficiency     Social History   Socioeconomic History  . Marital status: Widowed    Spouse name: Not on file  . Number of children: Not on file  . Years of education: Not on file  . Highest education level: Not on file  Occupational History  . Not on file  Social Needs  . Financial resource strain: Not on file  . Food insecurity:    Worry: Not on file    Inability: Not on file  . Transportation needs:    Medical: Not on file    Non-medical: Not on file  Tobacco Use  . Smoking status: Former Smoker    Types: Cigars    Last attempt to quit: 09/02/1977    Years since quitting: 41.2  . Smokeless tobacco: Never Used  . Tobacco comment:    Substance and Sexual Activity  . Alcohol use: Not Currently    Comment: "stopped drinking alcohol in ~ 1980; just drank a little on the weekends when I did drink"  . Drug use: No  . Sexual activity: Not Currently  Lifestyle  . Physical activity:    Days per week: Not on file    Minutes per session: Not on file  . Stress: Not on file  Relationships    . Social connections:    Talks on phone: Not on file    Gets together: Not on file    Attends religious service: Not on file    Active member of club or organization: Not on file    Attends meetings of clubs or organizations: Not on file    Relationship status: Not on file  . Intimate partner violence:    Fear of current or ex  partner: Not on file    Emotionally abused: Not on file    Physically abused: Not on file    Forced sexual activity: Not on file  Other Topics Concern  . Not on file  Social History Narrative   Lives alone.  Wife died in Apr 06, 2023.  Lives in apartment.       Family History  Problem Relation Age of Onset  . Emphysema Father   . Asthma Father   . Liver disease Father        tumor  . Heart disease Mother   . Hypertension Mother   . Asthma Sister   . Hypertension Son      Review of Systems: General: negative for chills, fever, night sweats or weight changes.  Cardiovascular: negative for chest pain, dyspnea on exertion, edema, orthopnea, paroxysmal nocturnal dyspnea or shortness of breath Dermatological: negative for rash Respiratory: negative for cough or wheezing Urologic: negative for hematuria Abdominal: negative for nausea, vomiting, diarrhea, bright red blood per rectum, melena, or hematemesis Neurologic: negative for visual changes, syncope, or dizziness All other systems reviewed and are otherwise negative except as noted above.    Blood pressure (!) 156/84, pulse (!) 58, height 5' 6"  (1.676 m), weight 219 lb (99.3 kg).  General appearance: alert, cooperative, no distress and poor dentition Neck: no JVD Lungs: faint expiratory wheezing on Rt Heart: regular rate and rhythm Extremities: no edema Skin: Skin color, texture, turgor normal. No rashes or lesions Neurologic: Grossly normal  EKG NSR, 58  ASSESSMENT AND PLAN:   Palpitations Seen today for f/u of palpitations  COPD exacerbation (HCC) Steroids and ABs added by PCP.  Essential  (primary) hypertension Repeat B/P by me 134/72  CAD (coronary artery disease) 12/2015 NSTEMI/Cath (in setting of PAF):  LM nl, LAD 30p,  LCX 77m RCA  ok, AM 100%, RPDA1 40, RPDA 2 60 ->Med Rx.  Chronic diastolic CHF (congestive heart failure) ([place] 9/19- Echo: EF 55-60%, grade DD, mod AI, mod Ao root dil, mild MR, mod dil LA, mod RA.   PLAN  No change in Rx. He declined a monitor.   LKerin RansomPA-C 11/05/2018 5:34 PM

## 2018-11-30 ENCOUNTER — Encounter (HOSPITAL_COMMUNITY): Payer: Self-pay | Admitting: Emergency Medicine

## 2018-11-30 ENCOUNTER — Emergency Department (HOSPITAL_COMMUNITY)
Admission: EM | Admit: 2018-11-30 | Discharge: 2018-11-30 | Disposition: A | Discharge status: Home / Self Care | Payer: Medicare Other | Attending: Emergency Medicine | Admitting: Emergency Medicine

## 2018-11-30 ENCOUNTER — Other Ambulatory Visit: Payer: Self-pay

## 2018-11-30 ENCOUNTER — Emergency Department (HOSPITAL_COMMUNITY): Payer: Medicare Other

## 2018-11-30 DIAGNOSIS — I480 Paroxysmal atrial fibrillation: Secondary | ICD-10-CM | POA: Insufficient documentation

## 2018-11-30 DIAGNOSIS — E1122 Type 2 diabetes mellitus with diabetic chronic kidney disease: Secondary | ICD-10-CM | POA: Diagnosis not present

## 2018-11-30 DIAGNOSIS — Z87891 Personal history of nicotine dependence: Secondary | ICD-10-CM | POA: Diagnosis not present

## 2018-11-30 DIAGNOSIS — I2510 Atherosclerotic heart disease of native coronary artery without angina pectoris: Secondary | ICD-10-CM | POA: Diagnosis not present

## 2018-11-30 DIAGNOSIS — Z79899 Other long term (current) drug therapy: Secondary | ICD-10-CM | POA: Diagnosis not present

## 2018-11-30 DIAGNOSIS — E785 Hyperlipidemia, unspecified: Secondary | ICD-10-CM | POA: Diagnosis not present

## 2018-11-30 DIAGNOSIS — D649 Anemia, unspecified: Secondary | ICD-10-CM

## 2018-11-30 DIAGNOSIS — J449 Chronic obstructive pulmonary disease, unspecified: Secondary | ICD-10-CM | POA: Diagnosis not present

## 2018-11-30 DIAGNOSIS — N183 Chronic kidney disease, stage 3 (moderate): Secondary | ICD-10-CM | POA: Insufficient documentation

## 2018-11-30 DIAGNOSIS — I5032 Chronic diastolic (congestive) heart failure: Secondary | ICD-10-CM | POA: Diagnosis not present

## 2018-11-30 DIAGNOSIS — R55 Syncope and collapse: Secondary | ICD-10-CM

## 2018-11-30 DIAGNOSIS — I130 Hypertensive heart and chronic kidney disease with heart failure and stage 1 through stage 4 chronic kidney disease, or unspecified chronic kidney disease: Secondary | ICD-10-CM | POA: Diagnosis not present

## 2018-11-30 DIAGNOSIS — E876 Hypokalemia: Secondary | ICD-10-CM

## 2018-11-30 DIAGNOSIS — N289 Disorder of kidney and ureter, unspecified: Secondary | ICD-10-CM

## 2018-11-30 LAB — BASIC METABOLIC PANEL
Anion gap: 9 (ref 5–15)
BUN: 22 mg/dL (ref 8–23)
CO2: 27 mmol/L (ref 22–32)
Calcium: 8.7 mg/dL — ABNORMAL LOW (ref 8.9–10.3)
Chloride: 102 mmol/L (ref 98–111)
Creatinine, Ser: 1.69 mg/dL — ABNORMAL HIGH (ref 0.61–1.24)
GFR calc Af Amer: 45 mL/min — ABNORMAL LOW (ref >60)
GFR calc non Af Amer: 39 mL/min — ABNORMAL LOW (ref >60)
GLUCOSE: 85 mg/dL (ref 70–99)
Potassium: 3.4 mmol/L — ABNORMAL LOW (ref 3.5–5.1)
Sodium: 138 mmol/L (ref 135–145)

## 2018-11-30 LAB — URINALYSIS, ROUTINE W REFLEX MICROSCOPIC
Bilirubin Urine: NEGATIVE
Glucose, UA: NEGATIVE mg/dL
Hgb urine dipstick: NEGATIVE
Ketones, ur: NEGATIVE mg/dL
Leukocytes,Ua: NEGATIVE
Nitrite: NEGATIVE
Protein, ur: NEGATIVE mg/dL
Specific Gravity, Urine: 1.008 (ref 1.005–1.030)
pH: 7 (ref 5.0–8.0)

## 2018-11-30 LAB — CBC
HCT: 29.4 % — ABNORMAL LOW (ref 39.0–52.0)
Hemoglobin: 9.3 g/dL — ABNORMAL LOW (ref 13.0–17.0)
MCH: 30.9 pg (ref 26.0–34.0)
MCHC: 31.6 g/dL (ref 30.0–36.0)
MCV: 97.7 fL (ref 80.0–100.0)
PLATELETS: 219 10*3/uL (ref 150–400)
RBC: 3.01 MIL/uL — AB (ref 4.22–5.81)
RDW: 13.8 % (ref 11.5–15.5)
WBC: 4.5 10*3/uL (ref 4.0–10.5)
nRBC: 0 % (ref 0.0–0.2)

## 2018-11-30 LAB — TROPONIN I: Troponin I: <0.03 ng/mL (ref <0.03)

## 2018-11-30 LAB — LACTIC ACID, PLASMA: Lactic Acid, Venous: 0.8 mmol/L (ref 0.5–1.9)

## 2018-11-30 LAB — CBG MONITORING, ED: Glucose-Capillary: 77 mg/dL (ref 70–99)

## 2018-11-30 MED ORDER — SODIUM CHLORIDE 0.9% FLUSH: 3.0000 mL | Freq: Once | INTRAVENOUS | Status: DC

## 2018-11-30 MED ORDER — POTASSIUM CHLORIDE CRYS ER 20 MEQ PO TBCR: 20.0000 meq | EXTENDED_RELEASE_TABLET | Freq: Two times a day (BID) | ORAL | 0 refills | Status: DC

## 2018-11-30 MED ORDER — POTASSIUM CHLORIDE CRYS ER 20 MEQ PO TBCR
40.0000 meq | EXTENDED_RELEASE_TABLET | Freq: Once | ORAL | Status: AC
  Administered 2018-11-30: 40 meq via ORAL
  Filled 2018-11-30: qty 2

## 2018-11-30 NOTE — Discharge Instructions (Addendum)
Return if symptoms are getting worse. °

## 2018-11-30 NOTE — ED Triage Notes (Signed)
Per EMS, pt feels his heart is "skipping a beat... feeling funny."  Pt states several syncopal episodes.  Pt states he has stopped several medications recently.  Pt's heart rate dropped into 40s when he first arrived.

## 2018-11-30 NOTE — ED Notes (Signed)
Patient verbalizes understanding of discharge instructions. Opportunity for questioning and answers were provided. Armband removed by staff, pt discharged from ED.  

## 2018-11-30 NOTE — ED Provider Notes (Signed)
[facility] EMERGENCY DEPARTMENT Provider Note   CSN: [id number] Arrival date & time: 11/30/18  0093    History   Chief Complaint Chief Complaint  Patient presents with  . Near Syncope    HPI [name] [name] is a 72 y.o. male.  The history is provided by the patient.  He has history of coronary artery disease, diastolic heart failure, chronic kidney disease, hypertension, hyperlipidemia, diabetes and comes in because of a near syncopal episode tonight.  He states that he has been having some intermittent dyspnea for several months.  Episodes only last a few minutes before resolving.  Tonight, while in bed, he noted that things were going black and he could not move his arm.  Is not sure how long that lasted.  That is something new for him.  He denies chest pain, heaviness, tightness, pressure.  Denies nausea vomiting,  diaphoresis.  Past Medical History:  Diagnosis Date  . Abnormal CT of liver    a. nodular contour suggesting cirrhosis 05/2018.  [name] Abnormal LFTs (liver function tests)   . Anxiety   . Aortic root dilatation ([place])   . Ascending aortic aneurysm ([place])   . BPH with urinary obstruction   . Bradycardia   . 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 CAD (coronary artery disease)    a. 12/2015 NSTEMI/Cath (in setting of PAF):  LM nl, LAD 30p,  LCX 83m RCA  ok, AM 100%, RPDA1 40, RPDA 2 60 ->Med Rx.  . Childhood asthma   . Chronic diastolic CHF (congestive heart failure) ([place]    a. 12/2015 Echo: EF 50-55%, mod AI, mod Ao root dil, mild MR, mod dil LA, mod RA.  . CKD (chronic kidney disease), stage III ([place]   . Colon cancer ([place]   . COPD (chronic obstructive pulmonary disease) ([facility]    pt denies at preop  . Diabetes mellitus type II    diet controlled  . History of syncope   . Hyperlipidemia   . Hypertensive heart disease   . Kidney lump 04/04/2010   Overview:  Renal Cell  Carcinoma   . Light chain myeloma (HCenter   . Moderate aortic insufficiency    a. 12/2015 Echo: Mod AI.  .Marland KitchenParoxysmal atrial fibrillation ([place]    a. 12/2015 started on Xarelto (CHA2DS2VASc = 4-5).  . Pneumonia   . Prostate cancer ([place]   . Renal cell carcinoma ([facility]   . Sleep apnea    Does not like  CPAP  . Spinal stenosis in cervical region 10/12/2016  . Venous insufficiency     Patient Active Problem List   Diagnosis Date Noted  . COPD exacerbation ([place] 11/05/2018  . Palpitations 10/22/2018  . Counseling regarding advance care planning and goals of care 06/22/2018  . AKI (acute kidney injury) ([place]   . Anemia   . Sepsis ([place] 06/05/2018  . Acute lower UTI 06/05/2018  . Colon polyp 05/21/2018  . Aortic valve regurgitation 04/14/2018  . Medication management 04/14/2018  . Thoracic aortic aneurysm without rupture ([facility] 09/29/2017  . 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 Head trauma, subsequent encounter 10/12/2016  . 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 Spinal stenosis in cervical region 10/12/2016  . Cervical spondylosis 10/12/2016  . Syncope and collapse 10/12/2016  . Fall   . Syncope 10/11/2016  .  CAD (coronary artery disease)   . Chronic diastolic CHF (congestive heart failure) ([place])   . Hyperlipidemia   . Hypertensive heart disease   . Paroxysmal atrial fibrillation ([place]) 12/17/2015  . Multiple myeloma ([place]) 12/17/2015  . Mild intermittent asthma 10/18/2015  . Aortic root dilatation ([place]) 10/17/2015  . Diabetes ([place]) 10/17/2015  . 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 Hand paresthesia 12/09/2014  . Elevated prostate specific antigen (PSA) 07/12/2014  . Allergic rhinitis 07/12/2014  . LBP (low back pain) 07/12/2014  . Palpitation 07/11/2014  . Patient's other noncompliance with medication regimen 08/10/2013  . Chronic venous insufficiency  07/23/2012  . Obstructive apnea 04/11/2011  . Essential (primary) hypertension 09/07/2010  . 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 Anxiety, generalized 04/04/2010  . Cardiac conduction disorder 02/27/2010  . Benign prostatic hyperplasia without urinary obstruction 09/13/2009  . Abnormal findings on examination of genitourinary organs 08/09/2009  . Hereditary and idiopathic neuropathy 07/05/2009    Past Surgical History:  Procedure Laterality Date  . BACK SURGERY    . CARDIAC CATHETERIZATION N/A 12/18/2015   Procedure: Left Heart Cath and Coronary Angiography;  Surgeon: Leonie Man, MD;  Location: [facility];  Service: Cardiovascular;  Laterality: N/A;  . DIAGNOSTIC LAPAROSCOPY     partial colectomy  . LAPAROSCOPIC PARTIAL COLECTOMY Right 05/21/2018   Procedure: LAPAROSCOPIC RIGHT  COLECTOMY ERAS PATHWAY;  Surgeon: Leighton Ruff, MD;  Location: [facility];  Service: General;  Laterality: Right;  . [place]   "replaced a disc"        Home Medications    Prior to Admission medications   Medication Sig Start Date End Date Taking? Authorizing Provider  albuterol (PROVENTIL) (2.5 MG/3ML) 0.083% nebulizer solution Take 3 mLs (2.5 mg total) by nebulization every 4 (four) hours as needed for wheezing or shortness of breath. 09/02/18   Veryl Speak, MD  amLODipine (NORVASC) 10 MG tablet Take 1 tablet (10 mg total) by mouth daily. 09/07/18   Scot Jun, FNP  atorvastatin (LIPITOR) 80 MG tablet Take 1 tablet (80 mg total) by mouth daily. 09/07/18 12/06/18  Scot Jun, FNP  blood glucose meter kit and supplies KIT Dispense based on patient and insurance preference. Use up to four times daily as directed. (FOR ICD-10 E11.69). 09/07/18   Scot Jun, FNP  cetirizine (ZYRTEC) 10 MG tablet Take 10 mg by mouth daily.    [provider]  ferrous sulfate (FERROUSUL) 325 (65 FE) MG tablet Take 1 tablet (325 mg total) by mouth 2 (two)  times daily with a meal. 10/06/18   Scot Jun, FNP  fluticasone (FLONASE) 50 MCG/ACT nasal spray Place 2 sprays into both nostrils daily. 08/18/18   [provider]  glucose blood test strip Check blood sugar twice per day. E.11.9 10/06/18   Scot Jun, FNP  ipratropium (ATROVENT) 0.03 % nasal spray Place 2 sprays into both nostrils at bedtime. 10/06/18   Scot Jun, FNP  levofloxacin (LEVAQUIN) 500 MG tablet Take 1 tablet (500 mg total) by mouth daily. 11/04/18   Scot Jun, FNP  potassium chloride SA (K-DUR,KLOR-CON) 20 MEQ tablet Take 20 mEq by mouth 2 (two) times daily.    [provider]  tamsulosin (FLOMAX) 0.4 MG CAPS capsule Take 1 capsule (0.4 mg total) by mouth daily. 1/58/30   Leighton Ruff, MD    Family History Family History  Problem Relation Age of Onset  .  Emphysema Father   . Asthma Father   . Liver disease Father        tumor  . Heart disease Mother   . Hypertension Mother   . Asthma Sister   . Hypertension Son     Social History Social History   Tobacco Use  . Smoking status: Former Smoker    Types: Cigars    Last attempt to quit: 09/02/1977    Years since quitting: 41.2  . Smokeless tobacco: Never Used  . Tobacco comment:    Substance Use Topics  . Alcohol use: Not Currently    Comment: "stopped drinking alcohol in ~ 1980; just drank a little on the weekends when I did drink"  . Drug use: No     Allergies   Amoxicillin and Penicillins   Review of Systems Review of Systems  All other systems reviewed and are negative.    Physical Exam Updated Vital Signs BP (!) 145/92   Pulse (!) 51   Temp 97.7 F (36.5 C) (Oral)   Resp 15   Ht 5' 6"  (1.676 m)   Wt 98.9 kg   SpO2 97%   BMI 35.19 kg/m   Physical Exam Vitals signs and nursing note reviewed.    73 year old male, resting comfortably and in no acute distress. Vital signs are significant for elevated blood pressure and slow heart rate. Oxygen  saturation is 97%, which is normal. Head is normocephalic and atraumatic. PERRLA, EOMI. Oropharynx is clear. Neck is nontender and supple without adenopathy or JVD. Back is nontender and there is no CVA tenderness. Lungs are clear without rales, wheezes, or rhonchi. Chest is nontender. Heart is bradycardic without murmur. Abdomen is soft, flat, nontender without masses or hepatosplenomegaly and peristalsis is normoactive. Extremities have no cyanosis or edema, full range of motion is present. Skin is warm and dry without rash. Neurologic: Mental status is normal, cranial nerves are intact, there are no motor or sensory deficits.  ED Treatments / Results  Labs (all labs ordered are listed, but only abnormal results are displayed) Labs Reviewed  BASIC METABOLIC PANEL - Abnormal; Notable for the following components:      Result Value   Potassium 3.4 (*)    Creatinine, Ser 1.69 (*)    Calcium 8.7 (*)    GFR calc non Af Amer 39 (*)    GFR calc Af Amer 45 (*)    All other components within normal limits  CBC - Abnormal; Notable for the following components:   RBC 3.01 (*)    Hemoglobin 9.3 (*)    HCT 29.4 (*)    All other components within normal limits  URINALYSIS, ROUTINE W REFLEX MICROSCOPIC - Abnormal; Notable for the following components:   Color, Urine STRAW (*)    All other components within normal limits  TROPONIN I  LACTIC ACID, PLASMA  LACTIC ACID, PLASMA  CBG MONITORING, ED    EKG EKG Interpretation  Date/Time:  Monday November 30 2018 04:24:18 EDT Ventricular Rate:  53 PR Interval:    QRS Duration: 123 QT Interval:  482 QTC Calculation: 453 R Axis:   -61 Text Interpretation:  Sinus rhythm Nonspecific IVCD with LAD Probable anteroseptal infarct, old Left anterior fasicular block When compared with ECG of 10/22/2018, No significant change was found Confirmed by Delora Fuel ([id number]) on 11/30/2018 4:27:03 AM Also confirmed by Delora Fuel ([id number]), editor Philomena Doheny  [phone])  on 11/30/2018 7:07:45 AM   Radiology Dg Chest [facility]  Result Date: 11/30/2018 CLINICAL DATA:  Shortness of breath EXAM: PORTABLE CHEST 1 VIEW COMPARISON:  11/04/2018 FINDINGS: Cardiomegaly and aortic tortuosity. There is no edema, consolidation, effusion, or pneumothorax. No osseous findings. IMPRESSION: 1. No evidence of acute disease. 2. Chronic cardiomegaly. Electronically Signed   By: Monte Fantasia M.D.   On: 11/30/2018 04:46    Procedures Procedures   Medications Ordered in ED Medications  sodium chloride flush (NS) 0.9 % injection 3 mL (has no administration in time range)  potassium chloride SA (K-DUR,KLOR-CON) CR tablet 40 mEq (40 mEq Oral Given 11/30/18 6803)     Initial Impression / Assessment and Plan / ED Course  I have reviewed the triage vital signs and the nursing notes.  Pertinent labs & imaging results that were available during my care of the patient were reviewed by me and considered in my medical decision making (see chart for details).  Near syncopal episode of uncertain cause.  Old records are reviewed, and he had been admitted to the hospital 1 month ago for evaluation of chest pain and was noted to have an episode of paroxysmal atrial fibrillation as well as bradycardia.  Will check screening labs and observe in the ED.  ECG shows no acute changes.  Chest x-ray shows no acute changes.  Labs show stable anemia and stable renal insufficiency.  Mild hypokalemia is noted and is given a dose of oral potassium.  Urinalysis shows no evidence of infection.  Orthostatic vital signs showed no significant change in pulse or blood pressure.  He was feeling better at this point and was felt to be safe for discharge.  He is given prescription for K-Dur at home for the next 5 days and to just follow-up with his PCP.  Return precautions discussed.  Final Clinical Impressions(s) / ED Diagnoses   Final diagnoses:  Near syncope  Hypokalemia  Renal insufficiency   Normochromic normocytic anemia    ED Discharge Orders         Ordered    potassium chloride SA (K-DUR,KLOR-CON) 20 MEQ tablet  2 times daily     11/30/18 2122           Delora Fuel, MD 48/25/00 [phone]

## 2018-12-02 ENCOUNTER — Ambulatory Visit: Payer: Medicare Other | Admitting: Family Medicine

## 2019-01-06 ENCOUNTER — Ambulatory Visit: Payer: Medicare Other | Admitting: Family Medicine

## 2019-02-15 ENCOUNTER — Telehealth: Payer: Self-pay | Admitting: Family Medicine

## 2019-02-15 MED ORDER — AMLODIPINE BESYLATE 10 MG PO TABS: 10.0000 mg | ORAL_TABLET | Freq: Every day | ORAL | 0 refills | Status: DC

## 2019-02-15 NOTE — Telephone Encounter (Signed)
Rx sent to pharmacy   

## 2019-02-15 NOTE — Telephone Encounter (Signed)
Caller Name: [name] [name]  Reason for Call:  Medication refill for amlodipine  Patient states he only has 4 pills left.   If this is a medication request: confirm pharmacy   [id number] - [place], [place] call back number: [phone]   Action taken by recipient of request:

## 2019-03-18 ENCOUNTER — Ambulatory Visit (INDEPENDENT_AMBULATORY_CARE_PROVIDER_SITE_OTHER): Payer: Medicare Other | Admitting: Family Medicine

## 2019-03-18 ENCOUNTER — Encounter: Payer: Self-pay | Admitting: Family Medicine

## 2019-03-18 ENCOUNTER — Telehealth: Payer: Self-pay | Admitting: *Deleted

## 2019-03-18 DIAGNOSIS — R140 Abdominal distension (gaseous): Secondary | ICD-10-CM | POA: Diagnosis not present

## 2019-03-18 DIAGNOSIS — N2889 Other specified disorders of kidney and ureter: Secondary | ICD-10-CM | POA: Diagnosis not present

## 2019-03-18 DIAGNOSIS — R109 Unspecified abdominal pain: Secondary | ICD-10-CM

## 2019-03-18 DIAGNOSIS — C9000 Multiple myeloma not having achieved remission: Secondary | ICD-10-CM

## 2019-03-18 NOTE — Telephone Encounter (Signed)
[name] [name] called asking when and where he needed to have his imaging for his head and stomach. He wanted to know to arrange his ride.  Patient informed message will be routed to his provider.

## 2019-03-18 NOTE — Telephone Encounter (Addendum)
Patient was advised explicitly to go to the ER for further evaluation of the symptoms he is currently experiencing.  Patient has metastatic cancer but refuses to believe that he has cancer.  He also suffers from underlying heart disease.  He has discontinued all treatment with the cancer center.  And today during our encounter refused to acknowledge that his symptoms may be related to worsening metastatic cancer.  Advised him to go to the ER for further work-up of abdominal pain and abdominal distention.  He advised me that his son would take him to the ER.  Please remind patient of this conversation from earlier today.

## 2019-03-18 NOTE — Progress Notes (Signed)
Virtual Visit via Telephone Note  I connected with [place] on 03/18/19 at 11:10 AM EDT by telephone and verified that I am speaking with the correct person using two identifiers.  Location: Patient: Located at home during today's encounter  Provider: Located at primary care office     I discussed the limitations, risks, security and privacy concerns of performing an evaluation and management service by telephone and the availability of in person appointments. I also discussed with the patient that there may be a patient responsible charge related to this service. The patient expressed understanding and agreed to proceed.  History of Present Illness: [name] [name] is present via today's telemedicine encounter with a complaint of gradually worsening abdominal enlargement and abdominal pain. He report of recent his abdomen has increased in size and he is unable to wear shirts that he previously could. To provide background, [name] [name] was a recent oncology patient with metastatic cancer secondary to multiple myeloma. He completely stop follow-up with the [facility] earlier this year. He has renal cancer, stopped follow-up with urology after multiple attempted to assist him with scheduling an appointment. I also had a very open discussion with him regarding palliative care services, and this suggestions only angered the patient. Multiple attempts have been made to reach family members to discuss patient's treatment and this attempt has also been unsuccessful. He feel that his stomach is related to a colonoscopy he underwent last year and is admitted that this is the source of his abdominal symptoms. He is requesting a antibiotic and x-ray. When the subject of cancer is approached becomes verbally upset and states "I don't have cancer, I believe in [place]". He also suffers from cardiovascular disease, heart failure and PAF. He is prescribed chronic anticoagulation. He denies any vomiting, nausea, or  poor oral intake of food. He is talking about multiple things during today's encounter and it is difficult for him to focus on the purpose of visit. He reports his daughter and son are coming to his home tomorrow. He reports daughter told him his abdominal pain and distension is related to colonoscopy.  Assessment and Plan: 1. Abdominal distension 2. Abdominal pain, unspecified abdominal location 3. Renal mass 4. Multiple myeloma not having achieved remission [facility]) Given the complexity of patient's symptoms today I am recommend that he follow-up close up at the emergency department.  I am concerned that symptoms are related to worsening metastatic cancer and or related to underlying heart disease.  I also suspect the patient has some underlying cognitive issues, which unfortunately are difficult to be addressed given the absence of family and ability to contact family which has been previously attempted. Patient warrants extensive work-up given no recent follow-up with oncology and explained to him that the ER would be the most appropriate place to receive that work-up given his current symptoms today.  He reports that his son is will take him to the ER. Advised if symptoms worsen or did not improve calling EMS would be the most appropriate next step.  Patient did verbalize understanding of plan.  No antibiotic or x-ray ordered as based treatments are not warranted given patient's symptoms today. Follow Up Instructions: Keep scheduled follow-up on file.   I discussed the assessment and treatment plan with the patient. The patient was provided an opportunity to ask questions and all were answered. The patient agreed with the plan and demonstrated an understanding of the instructions.   The patient was advised to call back or  seek an in-person evaluation if the symptoms worsen or if the condition fails to improve as anticipated.  I provided 20 minutes of non-face-to-face time during this  encounter.   Molli Barrows, FNP

## 2019-03-19 NOTE — Telephone Encounter (Signed)
Called patient to reiterate conversation. He states that he will have son take him to ER.

## 2019-03-31 ENCOUNTER — Telehealth: Payer: Self-pay | Admitting: *Deleted

## 2019-03-31 NOTE — Telephone Encounter (Signed)
A message was left, re: follow up visit. 

## 2019-05-14 ENCOUNTER — Other Ambulatory Visit: Payer: Self-pay | Admitting: Family Medicine

## 2019-05-17 ENCOUNTER — Telehealth: Payer: Self-pay | Admitting: Family Medicine

## 2019-05-17 MED ORDER — ATORVASTATIN CALCIUM 80 MG PO TABS: 80.0000 mg | ORAL_TABLET | Freq: Every day | ORAL | 0 refills | Status: DC

## 2019-05-17 MED ORDER — AMLODIPINE BESYLATE 10 MG PO TABS: 10.0000 mg | ORAL_TABLET | Freq: Every day | ORAL | 0 refills | Status: DC

## 2019-05-17 NOTE — Telephone Encounter (Signed)
Attempted to call patient to cancel lab appt.

## 2019-05-17 NOTE — Telephone Encounter (Signed)
1) Medication(s) Requested (by name): amLODipine (NORVASC) 10 MG tablet [[id number]] atorvastatin (LIPITOR) 80 MG tablet [[id number]]   2) Pharmacy of [facility]: [id number] [place], [place]    Approved medications will be sent to pharmacy, we will reach out to you if there is an issue.  Requests made after 3pm may not be addressed until following business day!

## 2019-05-17 NOTE — Telephone Encounter (Signed)
Refills sent

## 2019-05-18 ENCOUNTER — Other Ambulatory Visit: Payer: Medicare Other

## 2019-05-25 ENCOUNTER — Other Ambulatory Visit: Payer: Self-pay | Admitting: General Surgery

## 2019-05-25 DIAGNOSIS — K432 Incisional hernia without obstruction or gangrene: Secondary | ICD-10-CM

## 2019-06-01 ENCOUNTER — Other Ambulatory Visit: Payer: Self-pay | Admitting: General Surgery

## 2019-06-01 ENCOUNTER — Ambulatory Visit
Admission: RE | Admit: 2019-06-01 | Discharge: 2019-06-01 | Disposition: A | Discharge status: Home / Self Care | Payer: Medicare Other | Source: Ambulatory Visit | Attending: General Surgery | Admitting: General Surgery

## 2019-06-01 ENCOUNTER — Other Ambulatory Visit: Payer: Self-pay

## 2019-06-01 DIAGNOSIS — K432 Incisional hernia without obstruction or gangrene: Secondary | ICD-10-CM

## 2019-06-01 MED ORDER — IOPAMIDOL (ISOVUE-300) INJECTION 61%: 100.0000 mL | Freq: Once | INTRAVENOUS | Status: DC | PRN

## 2019-06-03 ENCOUNTER — Encounter (HOSPITAL_COMMUNITY): Payer: Self-pay

## 2019-06-03 ENCOUNTER — Other Ambulatory Visit: Payer: Self-pay

## 2019-06-03 ENCOUNTER — Emergency Department (HOSPITAL_COMMUNITY): Payer: Medicare Other

## 2019-06-03 ENCOUNTER — Emergency Department (HOSPITAL_COMMUNITY)
Admission: EM | Admit: 2019-06-03 | Discharge: 2019-06-03 | Disposition: A | Discharge status: Home / Self Care | Payer: Medicare Other | Source: Home / Self Care | Attending: Emergency Medicine | Admitting: Emergency Medicine

## 2019-06-03 DIAGNOSIS — I2510 Atherosclerotic heart disease of native coronary artery without angina pectoris: Secondary | ICD-10-CM | POA: Insufficient documentation

## 2019-06-03 DIAGNOSIS — I110 Hypertensive heart disease with heart failure: Secondary | ICD-10-CM | POA: Insufficient documentation

## 2019-06-03 DIAGNOSIS — N189 Chronic kidney disease, unspecified: Secondary | ICD-10-CM

## 2019-06-03 DIAGNOSIS — M79675 Pain in left toe(s): Secondary | ICD-10-CM | POA: Insufficient documentation

## 2019-06-03 DIAGNOSIS — M10372 Gout due to renal impairment, left ankle and foot: Secondary | ICD-10-CM | POA: Insufficient documentation

## 2019-06-03 DIAGNOSIS — I5032 Chronic diastolic (congestive) heart failure: Secondary | ICD-10-CM | POA: Insufficient documentation

## 2019-06-03 DIAGNOSIS — N179 Acute kidney failure, unspecified: Secondary | ICD-10-CM | POA: Diagnosis not present

## 2019-06-03 DIAGNOSIS — N289 Disorder of kidney and ureter, unspecified: Secondary | ICD-10-CM | POA: Insufficient documentation

## 2019-06-03 DIAGNOSIS — Z79899 Other long term (current) drug therapy: Secondary | ICD-10-CM | POA: Insufficient documentation

## 2019-06-03 LAB — CBC WITH DIFFERENTIAL/PLATELET
Abs Immature Granulocytes: 0.02 10*3/uL (ref 0.00–0.07)
Basophils Absolute: 0 10*3/uL (ref 0.0–0.1)
Basophils Relative: 0 %
Eosinophils Absolute: 0.1 10*3/uL (ref 0.0–0.5)
Eosinophils Relative: 2 %
HCT: 26.6 % — ABNORMAL LOW (ref 39.0–52.0)
Hemoglobin: 8.4 g/dL — ABNORMAL LOW (ref 13.0–17.0)
Immature Granulocytes: 0 %
Lymphocytes Relative: 19 %
Lymphs Abs: 1.2 10*3/uL (ref 0.7–4.0)
MCH: 31.8 pg (ref 26.0–34.0)
MCHC: 31.6 g/dL (ref 30.0–36.0)
MCV: 100.8 fL — ABNORMAL HIGH (ref 80.0–100.0)
Monocytes Absolute: 0.8 10*3/uL (ref 0.1–1.0)
Monocytes Relative: 12 %
Neutro Abs: 4.2 10*3/uL (ref 1.7–7.7)
Neutrophils Relative %: 67 %
Platelets: 216 10*3/uL (ref 150–400)
RBC: 2.64 MIL/uL — ABNORMAL LOW (ref 4.22–5.81)
RDW: 12.6 % (ref 11.5–15.5)
WBC: 6.4 10*3/uL (ref 4.0–10.5)
nRBC: 0 % (ref 0.0–0.2)

## 2019-06-03 LAB — COMPREHENSIVE METABOLIC PANEL
ALT: 48 U/L — ABNORMAL HIGH (ref 0–44)
AST: 39 U/L (ref 15–41)
Albumin: 4.3 g/dL (ref 3.5–5.0)
Alkaline Phosphatase: 172 U/L — ABNORMAL HIGH (ref 38–126)
Anion gap: 11 (ref 5–15)
BUN: 53 mg/dL — ABNORMAL HIGH (ref 8–23)
CO2: 24 mmol/L (ref 22–32)
Calcium: 9.4 mg/dL (ref 8.9–10.3)
Chloride: 103 mmol/L (ref 98–111)
Creatinine, Ser: 3.83 mg/dL — ABNORMAL HIGH (ref 0.61–1.24)
GFR calc Af Amer: 17 mL/min — ABNORMAL LOW (ref >60)
GFR calc non Af Amer: 14 mL/min — ABNORMAL LOW (ref >60)
Glucose, Bld: 97 mg/dL (ref 70–99)
Potassium: 3.3 mmol/L — ABNORMAL LOW (ref 3.5–5.1)
Sodium: 138 mmol/L (ref 135–145)
Total Bilirubin: 0.7 mg/dL (ref 0.3–1.2)
Total Protein: 7.7 g/dL (ref 6.5–8.1)

## 2019-06-03 MED ORDER — HYDROCODONE-ACETAMINOPHEN 5-325 MG PO TABS: 0.5000 | ORAL_TABLET | Freq: Four times a day (QID) | ORAL | 0 refills | Status: DC | PRN

## 2019-06-03 MED ORDER — HYDROCODONE-ACETAMINOPHEN 5-325 MG PO TABS
1.0000 | ORAL_TABLET | Freq: Once | ORAL | Status: AC
  Administered 2019-06-03: 1 via ORAL
  Filled 2019-06-03: qty 1

## 2019-06-03 MED ORDER — SODIUM CHLORIDE 0.9% FLUSH: 3.0000 mL | Freq: Once | INTRAVENOUS | Status: DC

## 2019-06-03 NOTE — ED Triage Notes (Signed)
Pt arrived with complaints of a swollen, red, warm great toe on left foot that has worsened over the week. Pt denies injury.

## 2019-06-03 NOTE — ED Provider Notes (Signed)
[facility] DEPT Provider Note  CSN: [id number] Arrival date & time: 06/03/19 0156  Chief Complaint(s) Toe Pain  HPI [name] [name] is a 73 y.o. male with a history of multiple myeloma and renal cell carcinoma who presents to the emergency department with 1 week of gradually worsening left toe pain with associated swelling and redness.  No prior history of gout.  No fevers or chills.  No trauma.  Pain worse with ambulation and range of motion.  No real alleviating factors.  Denies any other physical complaints.  HPI  Past Medical History Past Medical History:  Diagnosis Date  . Abnormal CT of liver    a. nodular contour suggesting cirrhosis 05/2018.  [name] Abnormal LFTs (liver function tests)   . Anxiety   . Aortic root dilatation ([place])   . Ascending aortic aneurysm ([place])   . BPH with urinary obstruction   . Bradycardia   . 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 CAD (coronary artery disease)    a. 12/2015 NSTEMI/Cath (in setting of PAF):  LM nl, LAD 30p,  LCX 38m RCA  ok, AM 100%, RPDA1 40, RPDA 2 60 ->Med Rx.  . Childhood asthma   . Chronic diastolic CHF (congestive heart failure) ([place]    a. 12/2015 Echo: EF 50-55%, mod AI, mod Ao root dil, mild MR, mod dil LA, mod RA.  . CKD (chronic kidney disease), stage III   . Colon cancer ([place]   . COPD (chronic obstructive pulmonary disease) ([place]    pt denies at preop  . Diabetes mellitus type II    diet controlled  . History of syncope   . Hyperlipidemia   . Hypertensive heart disease   . Kidney lump 04/04/2010   Overview:  Renal Cell Carcinoma   . Light chain myeloma ([place]   . Moderate aortic insufficiency    a. 12/2015 Echo: Mod AI.  .Marland KitchenParoxysmal atrial fibrillation ([place]    a. 12/2015 started on Xarelto (CHA2DS2VASc = 4-5).  . Pneumonia   . Prostate cancer (HS[place]   . Renal cell carcinoma (HT[place]   . Sleep apnea    Does not  like  CPAP  . Spinal stenosis in cervical region 10/12/2016  . Venous insufficiency    Patient Active Problem List   Diagnosis Date Noted  . COPD exacerbation ([place] 11/05/2018  . Palpitations 10/22/2018  . Counseling regarding advance care planning and goals of care 06/22/2018  . AKI (acute kidney injury) ([place]   . Anemia   . Sepsis ([place] 06/05/2018  . Acute lower UTI 06/05/2018  . Colon polyp 05/21/2018  . Aortic valve regurgitation 04/14/2018  . Medication management 04/14/2018  . Thoracic aortic aneurysm without rupture ([place] 09/29/2017  . 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 Head trauma, subsequent encounter 10/12/2016  . 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 Spinal stenosis in cervical region 10/12/2016  . Cervical spondylosis 10/12/2016  . Syncope and collapse 10/12/2016  . Fall   . Syncope 10/11/2016  . CAD (coronary artery disease)   . Chronic diastolic CHF (congestive heart failure) ([place]Bridge[place]   . Hyperlipidemia   . Hypertensive heart disease   . Paroxysmal atrial fibrillation (HL[place] 12/17/2015  . Multiple myeloma (HH[place]well 12/17/2015  . Mild intermittent asthma 10/18/2015  . Aortic root dilatation ([place] 10/17/2015  . Diabetes ([place]  10/17/2015  . 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 Hand paresthesia 12/09/2014  . Elevated prostate specific antigen (PSA) 07/12/2014  . Allergic rhinitis 07/12/2014  . LBP (low back pain) 07/12/2014  . Palpitation 07/11/2014  . Patient's other noncompliance with medication regimen 08/10/2013  . Chronic venous insufficiency 07/23/2012  . Obstructive apnea 04/11/2011  . Essential (primary) hypertension 09/07/2010  . 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 Anxiety, generalized 04/04/2010  . Cardiac conduction disorder 02/27/2010  . Benign prostatic hyperplasia without urinary obstruction 09/13/2009   . Abnormal findings on examination of genitourinary organs 08/09/2009  . Hereditary and idiopathic neuropathy 07/05/2009   Home Medication(s) Prior to Admission medications   Medication Sig Start Date End Date Taking? Authorizing Provider  amLODipine (NORVASC) 10 MG tablet Take 1 tablet (10 mg total) by mouth daily. 05/17/19  Yes Fulp, Cammie, MD  atorvastatin (LIPITOR) 80 MG tablet Take 1 tablet (80 mg total) by mouth daily. 05/17/19  Yes Fulp, Cammie, MD  cetirizine (ZYRTEC) 10 MG tablet Take 10 mg by mouth daily.   Yes [provider]  cholecalciferol (VITAMIN D3) 25 MCG (1000 UT) tablet Take 1,000 Units by mouth daily.   Yes [provider]  fluticasone (FLONASE) 50 MCG/ACT nasal spray Place 2 sprays into both nostrils daily. 08/18/18  Yes [provider]  ferrous sulfate (FERROUSUL) 325 (65 FE) MG tablet Take 1 tablet (325 mg total) by mouth 2 (two) times daily with a meal. Patient not taking: Reported on 06/03/2019 10/06/18   Scot Jun, FNP  glucose blood test strip Check blood sugar twice per day. E.11.9 10/06/18   Scot Jun, FNP  HYDROcodone-acetaminophen (NORCO/VICODIN) 5-325 MG tablet Take 0.5-1 tablets by mouth every 6 (six) hours as needed for up to 5 days for severe pain (That is not improved by your scheduled acetaminophen regimen). Please do not exceed 4000 mg of acetaminophen (Tylenol) a 24-hour period. Please note that he may be prescribed additional medicine that contains acetaminophen. 06/03/19 06/08/19  Fatima Blank, MD                                                                                                                                    Past Surgical History Past Surgical History:  Procedure Laterality Date  . BACK SURGERY    . CARDIAC CATHETERIZATION N/A 12/18/2015   Procedure: Left Heart Cath and Coronary Angiography;  Surgeon: Leonie Man, MD;  Location: [facility];  Service: Cardiovascular;   Laterality: N/A;  . DIAGNOSTIC LAPAROSCOPY     partial colectomy  . LAPAROSCOPIC PARTIAL COLECTOMY Right 05/21/2018   Procedure: LAPAROSCOPIC RIGHT  COLECTOMY ERAS PATHWAY;  Surgeon: Leighton Ruff, MD;  Location: [facility];  Service: General;  Laterality: Right;  . [place]   "replaced a disc"   Family History Family History  Problem Relation Age of Onset  .  Emphysema Father   . Asthma Father   . Liver disease Father        tumor  . Heart disease Mother   . Hypertension Mother   . Asthma Sister   . Hypertension Son     Social History Social History   Tobacco Use  . Smoking status: Former Smoker    Types: Cigars    Quit date: 09/02/1977    Years since quitting: 41.7  . Smokeless tobacco: Never Used  . Tobacco comment:    Substance Use Topics  . Alcohol use: Not Currently    Comment: "stopped drinking alcohol in ~ 1980; just drank a little on the weekends when I did drink"  . Drug use: No   Allergies Amoxicillin and Penicillins  Review of Systems Review of Systems All other systems are reviewed and are negative for acute change except as noted in the HPI  Physical Exam Vital Signs  I have reviewed the triage vital signs BP (!) 181/91 (BP Location: Right Arm)   Pulse (!) 58   Temp 98.6 F (37 C) (Oral)   Resp 16   Ht 5' 7" (1.702 m)   Wt 97.5 kg   SpO2 99%   BMI 33.67 kg/m   Physical Exam Vitals signs reviewed.  Constitutional:      General: He is not in acute distress.    Appearance: He is well-developed. He is not diaphoretic.  HENT:     Head: Normocephalic and atraumatic.     Jaw: No trismus.     Right Ear: External ear normal.     Left Ear: External ear normal.     Nose: Nose normal.  Eyes:     General: No scleral icterus.    Conjunctiva/sclera: Conjunctivae normal.  Neck:     Musculoskeletal: Normal range of motion.     Trachea: Phonation normal.  Cardiovascular:     Rate and Rhythm: Normal rate and regular rhythm.  Pulmonary:      Effort: Pulmonary effort is normal. No respiratory distress.     Breath sounds: No stridor.  Abdominal:     General: There is no distension.  Musculoskeletal: Normal range of motion.       Feet:  Neurological:     Mental Status: He is alert and oriented to person, place, and time.  Psychiatric:        Behavior: Behavior normal.     ED Results and Treatments Labs (all labs ordered are listed, but only abnormal results are displayed) Labs Reviewed  COMPREHENSIVE METABOLIC PANEL - Abnormal; Notable for the following components:      Result Value   Potassium 3.3 (*)    BUN 53 (*)    Creatinine, Ser 3.83 (*)    ALT 48 (*)    Alkaline Phosphatase 172 (*)    GFR calc non Af Amer 14 (*)    GFR calc Af Amer 17 (*)    All other components within normal limits  CBC WITH DIFFERENTIAL/PLATELET - Abnormal; Notable for the following components:   RBC 2.64 (*)    Hemoglobin 8.4 (*)    HCT 26.6 (*)    MCV 100.8 (*)    All other components within normal limits  EKG  EKG Interpretation  Date/Time:    Ventricular Rate:    PR Interval:    QRS Duration:   QT Interval:    QTC Calculation:   R Axis:     Text Interpretation:        Radiology Dg Foot 2 Views Left  Result Date: 06/03/2019 CLINICAL DATA:  One week of great toe swelling and pain EXAM: LEFT FOOT - 2 VIEW COMPARISON:  None. FINDINGS: Soft tissue swelling medial to the first MTP joint with soft tissue calcification. Mild hallux valgus. No definite juxta articular or marginal erosion. IMPRESSION: Swelling and soft tissue calcification at the first MTP joint, suspect gout. Electronically Signed   By: Monte Fantasia M.D.   On: 06/03/2019 05:40    Pertinent labs & imaging results that were available during my care of the patient were reviewed by me and considered in my medical decision making (see chart for  details).  Medications Ordered in ED Medications  sodium chloride flush (NS) 0.9 % injection 3 mL (has no administration in time range)  HYDROcodone-acetaminophen (NORCO/VICODIN) 5-325 MG per tablet 1 tablet (1 tablet Oral Given 06/03/19 0609)                                                                                                                                    Procedures Procedures  (including critical care time)  Medical Decision Making / ED Course I have reviewed the nursing notes for this encounter and the patient's prior records (if available in EHR or on provided paperwork).   [name] [name] was evaluated in Emergency Department on 06/03/2019 for the symptoms described in the history of present illness. He was evaluated in the context of the global COVID-19 pandemic, which necessitated consideration that the patient might be at risk for infection with the SARS-CoV-2 virus that causes COVID-19. Institutional protocols and algorithms that pertain to the evaluation of patients at risk for COVID-19 are in a state of rapid change based on information released by regulatory bodies including the CDC and federal and state organizations. These policies and algorithms were followed during the patient's care in the ED.  Presentation consistent with gout.  Plain film negative for trauma. Screening labs with worsening renal function.  Recommended inpatient management but patient declined.  He states that he has close follow-up with his urologist within the week.  Recommended he keep these appointments and inform his primary care provider as well of his worsening chronic renal disease.        Final Clinical Impression(s) / ED Diagnoses Final diagnoses:  Acute gout due to renal impairment involving toe of left foot  Acute on chronic renal insufficiency    The patient appears reasonably screened and/or stabilized for discharge and I doubt any other medical condition or other [facility]  requiring further screening, evaluation, or treatment in the ED at this time prior to discharge.  Disposition: Discharge  Condition: Good  I have discussed the results, Dx and Tx plan with the patient who expressed understanding and agree(s) with the plan. Discharge instructions discussed at great length. The patient was given strict return precautions who verbalized understanding of the instructions. No further questions at time of discharge.    ED Discharge Orders         Ordered    HYDROcodone-acetaminophen (NORCO/VICODIN) 5-325 MG tablet  Every 6 hours PRN     06/03/19 0708          [facility] narcotic database reviewed and no active prescriptions noted.   Follow Up: Scot Jun, FNP [id number] [place] [address] [phone]  Call        This chart was dictated using voice recognition software.  Despite best efforts to proofread,  errors can occur which can change the documentation meaning.   Fatima Blank, MD 06/03/19 [phone]

## 2019-06-03 NOTE — Discharge Instructions (Signed)
Please keep your appointments with your Urologist and inform them about your worsening renal insufficiency. Please inform your PCP of this as well.

## 2019-06-04 ENCOUNTER — Inpatient Hospital Stay (HOSPITAL_COMMUNITY)
Admission: EM | Admit: 2019-06-04 | Discharge: 2019-06-09 | DRG: 683 | Disposition: A | Discharge status: Home / Self Care | Payer: Medicare Other | Attending: Internal Medicine | Admitting: Internal Medicine

## 2019-06-04 ENCOUNTER — Inpatient Hospital Stay (HOSPITAL_COMMUNITY): Payer: Medicare Other

## 2019-06-04 ENCOUNTER — Other Ambulatory Visit: Payer: Self-pay

## 2019-06-04 ENCOUNTER — Encounter (HOSPITAL_COMMUNITY): Payer: Self-pay

## 2019-06-04 DIAGNOSIS — Z20828 Contact with and (suspected) exposure to other viral communicable diseases: Secondary | ICD-10-CM | POA: Diagnosis present

## 2019-06-04 DIAGNOSIS — E1122 Type 2 diabetes mellitus with diabetic chronic kidney disease: Secondary | ICD-10-CM | POA: Diagnosis present

## 2019-06-04 DIAGNOSIS — I252 Old myocardial infarction: Secondary | ICD-10-CM

## 2019-06-04 DIAGNOSIS — C649 Malignant neoplasm of unspecified kidney, except renal pelvis: Secondary | ICD-10-CM

## 2019-06-04 DIAGNOSIS — R001 Bradycardia, unspecified: Secondary | ICD-10-CM | POA: Diagnosis present

## 2019-06-04 DIAGNOSIS — I5032 Chronic diastolic (congestive) heart failure: Secondary | ICD-10-CM | POA: Diagnosis present

## 2019-06-04 DIAGNOSIS — N39490 Overflow incontinence: Secondary | ICD-10-CM | POA: Diagnosis present

## 2019-06-04 DIAGNOSIS — I712 Thoracic aortic aneurysm, without rupture: Secondary | ICD-10-CM | POA: Diagnosis present

## 2019-06-04 DIAGNOSIS — Z8249 Family history of ischemic heart disease and other diseases of the circulatory system: Secondary | ICD-10-CM

## 2019-06-04 DIAGNOSIS — I2510 Atherosclerotic heart disease of native coronary artery without angina pectoris: Secondary | ICD-10-CM | POA: Diagnosis present

## 2019-06-04 DIAGNOSIS — K439 Ventral hernia without obstruction or gangrene: Secondary | ICD-10-CM | POA: Diagnosis present

## 2019-06-04 DIAGNOSIS — D539 Nutritional anemia, unspecified: Secondary | ICD-10-CM | POA: Diagnosis present

## 2019-06-04 DIAGNOSIS — N189 Chronic kidney disease, unspecified: Secondary | ICD-10-CM

## 2019-06-04 DIAGNOSIS — N184 Chronic kidney disease, stage 4 (severe): Secondary | ICD-10-CM | POA: Diagnosis present

## 2019-06-04 DIAGNOSIS — N138 Other obstructive and reflux uropathy: Secondary | ICD-10-CM | POA: Diagnosis present

## 2019-06-04 DIAGNOSIS — N401 Enlarged prostate with lower urinary tract symptoms: Secondary | ICD-10-CM | POA: Diagnosis present

## 2019-06-04 DIAGNOSIS — I700 Atherosclerosis of aorta: Secondary | ICD-10-CM | POA: Diagnosis present

## 2019-06-04 DIAGNOSIS — I130 Hypertensive heart and chronic kidney disease with heart failure and stage 1 through stage 4 chronic kidney disease, or unspecified chronic kidney disease: Secondary | ICD-10-CM | POA: Diagnosis present

## 2019-06-04 DIAGNOSIS — Z85528 Personal history of other malignant neoplasm of kidney: Secondary | ICD-10-CM

## 2019-06-04 DIAGNOSIS — J449 Chronic obstructive pulmonary disease, unspecified: Secondary | ICD-10-CM | POA: Diagnosis present

## 2019-06-04 DIAGNOSIS — Z515 Encounter for palliative care: Secondary | ICD-10-CM | POA: Diagnosis present

## 2019-06-04 DIAGNOSIS — R310 Gross hematuria: Secondary | ICD-10-CM

## 2019-06-04 DIAGNOSIS — Z7189 Other specified counseling: Secondary | ICD-10-CM | POA: Diagnosis not present

## 2019-06-04 DIAGNOSIS — C9000 Multiple myeloma not having achieved remission: Secondary | ICD-10-CM | POA: Diagnosis present

## 2019-06-04 DIAGNOSIS — Z8546 Personal history of malignant neoplasm of prostate: Secondary | ICD-10-CM | POA: Diagnosis not present

## 2019-06-04 DIAGNOSIS — Z825 Family history of asthma and other chronic lower respiratory diseases: Secondary | ICD-10-CM

## 2019-06-04 DIAGNOSIS — Z881 Allergy status to other antibiotic agents status: Secondary | ICD-10-CM

## 2019-06-04 DIAGNOSIS — M109 Gout, unspecified: Secondary | ICD-10-CM | POA: Diagnosis present

## 2019-06-04 DIAGNOSIS — N179 Acute kidney failure, unspecified: Secondary | ICD-10-CM | POA: Diagnosis present

## 2019-06-04 DIAGNOSIS — M10379 Gout due to renal impairment, unspecified ankle and foot: Secondary | ICD-10-CM | POA: Diagnosis present

## 2019-06-04 DIAGNOSIS — N183 Chronic kidney disease, stage 3 unspecified: Secondary | ICD-10-CM

## 2019-06-04 DIAGNOSIS — E785 Hyperlipidemia, unspecified: Secondary | ICD-10-CM | POA: Diagnosis present

## 2019-06-04 DIAGNOSIS — Z79899 Other long term (current) drug therapy: Secondary | ICD-10-CM

## 2019-06-04 DIAGNOSIS — Z85038 Personal history of other malignant neoplasm of large intestine: Secondary | ICD-10-CM

## 2019-06-04 DIAGNOSIS — Z880 Allergy status to penicillin: Secondary | ICD-10-CM

## 2019-06-04 DIAGNOSIS — E876 Hypokalemia: Secondary | ICD-10-CM | POA: Diagnosis present

## 2019-06-04 DIAGNOSIS — H18413 Arcus senilis, bilateral: Secondary | ICD-10-CM | POA: Diagnosis present

## 2019-06-04 DIAGNOSIS — Z87891 Personal history of nicotine dependence: Secondary | ICD-10-CM

## 2019-06-04 DIAGNOSIS — C642 Malignant neoplasm of left kidney, except renal pelvis: Secondary | ICD-10-CM | POA: Diagnosis present

## 2019-06-04 DIAGNOSIS — I480 Paroxysmal atrial fibrillation: Secondary | ICD-10-CM | POA: Diagnosis present

## 2019-06-04 DIAGNOSIS — Z7951 Long term (current) use of inhaled steroids: Secondary | ICD-10-CM

## 2019-06-04 DIAGNOSIS — Z905 Acquired absence of kidney: Secondary | ICD-10-CM

## 2019-06-04 DIAGNOSIS — N320 Bladder-neck obstruction: Secondary | ICD-10-CM | POA: Diagnosis present

## 2019-06-04 DIAGNOSIS — Z9119 Patient's noncompliance with other medical treatment and regimen: Secondary | ICD-10-CM

## 2019-06-04 LAB — CBC
HCT: 24.6 % — ABNORMAL LOW (ref 39.0–52.0)
HCT: 30.9 % — ABNORMAL LOW (ref 39.0–52.0)
Hemoglobin: 8 g/dL — ABNORMAL LOW (ref 13.0–17.0)
Hemoglobin: 10 g/dL — ABNORMAL LOW (ref 13.0–17.0)
MCH: 32.8 pg (ref 26.0–34.0)
MCH: 32.9 pg (ref 26.0–34.0)
MCHC: 32.5 g/dL (ref 30.0–36.0)
MCHC: 32.4 g/dL (ref 30.0–36.0)
MCV: 100.8 fL — ABNORMAL HIGH (ref 80.0–100.0)
MCV: 101.6 fL — ABNORMAL HIGH (ref 80.0–100.0)
Platelets: 189 10*3/uL (ref 150–400)
Platelets: 177 10*3/uL (ref 150–400)
RBC: 2.44 MIL/uL — ABNORMAL LOW (ref 4.22–5.81)
RBC: 3.04 MIL/uL — ABNORMAL LOW (ref 4.22–5.81)
RDW: 12.8 % (ref 11.5–15.5)
RDW: 12.6 % (ref 11.5–15.5)
WBC: 5.1 10*3/uL (ref 4.0–10.5)
WBC: 4.3 10*3/uL (ref 4.0–10.5)
nRBC: 0 % (ref 0.0–0.2)
nRBC: 0 % (ref 0.0–0.2)

## 2019-06-04 LAB — CBC WITH DIFFERENTIAL/PLATELET
Abs Immature Granulocytes: 0.02 10*3/uL (ref 0.00–0.07)
Basophils Absolute: 0 10*3/uL (ref 0.0–0.1)
Basophils Relative: 0 %
Eosinophils Absolute: 0.1 10*3/uL (ref 0.0–0.5)
Eosinophils Relative: 2 %
HCT: 28 % — ABNORMAL LOW (ref 39.0–52.0)
Hemoglobin: 9 g/dL — ABNORMAL LOW (ref 13.0–17.0)
Immature Granulocytes: 0 %
Lymphocytes Relative: 23 %
Lymphs Abs: 1.4 10*3/uL (ref 0.7–4.0)
MCH: 32.6 pg (ref 26.0–34.0)
MCHC: 32.1 g/dL (ref 30.0–36.0)
MCV: 101.4 fL — ABNORMAL HIGH (ref 80.0–100.0)
Monocytes Absolute: 0.6 10*3/uL (ref 0.1–1.0)
Monocytes Relative: 10 %
Neutro Abs: 3.9 10*3/uL (ref 1.7–7.7)
Neutrophils Relative %: 65 %
Platelets: 218 10*3/uL (ref 150–400)
RBC: 2.76 MIL/uL — ABNORMAL LOW (ref 4.22–5.81)
RDW: 12.7 % (ref 11.5–15.5)
WBC: 6 10*3/uL (ref 4.0–10.5)
nRBC: 0 % (ref 0.0–0.2)

## 2019-06-04 LAB — URINALYSIS, MICROSCOPIC (REFLEX): RBC / HPF: >50 RBC/hpf (ref 0–5)

## 2019-06-04 LAB — PROTIME-INR
INR: 0.8 (ref 0.8–1.2)
Prothrombin Time: 11.4 seconds (ref 11.4–15.2)

## 2019-06-04 LAB — URINALYSIS, ROUTINE W REFLEX MICROSCOPIC
Bilirubin Urine: NEGATIVE
Glucose, UA: NEGATIVE mg/dL
Ketones, ur: NEGATIVE mg/dL
Nitrite: POSITIVE — AB
Protein, ur: >300 mg/dL — AB
Specific Gravity, Urine: 1.015 (ref 1.005–1.030)
pH: 6.5 (ref 5.0–8.0)

## 2019-06-04 LAB — I-STAT CHEM 8, ED
BUN: 48 mg/dL — ABNORMAL HIGH (ref 8–23)
Calcium, Ion: 1.19 mmol/L (ref 1.15–1.40)
Chloride: 99 mmol/L (ref 98–111)
Creatinine, Ser: 4 mg/dL — ABNORMAL HIGH (ref 0.61–1.24)
Glucose, Bld: 88 mg/dL (ref 70–99)
HCT: 28 % — ABNORMAL LOW (ref 39.0–52.0)
Hemoglobin: 9.5 g/dL — ABNORMAL LOW (ref 13.0–17.0)
Potassium: 3.3 mmol/L — ABNORMAL LOW (ref 3.5–5.1)
Sodium: 137 mmol/L (ref 135–145)
TCO2: 25 mmol/L (ref 22–32)

## 2019-06-04 LAB — SARS CORONAVIRUS 2 (TAT 6-24 HRS): SARS Coronavirus 2: NEGATIVE

## 2019-06-04 LAB — BASIC METABOLIC PANEL
Anion gap: 12 (ref 5–15)
BUN: 52 mg/dL — ABNORMAL HIGH (ref 8–23)
CO2: 23 mmol/L (ref 22–32)
Calcium: 9.2 mg/dL (ref 8.9–10.3)
Chloride: 100 mmol/L (ref 98–111)
Creatinine, Ser: 3.97 mg/dL — ABNORMAL HIGH (ref 0.61–1.24)
GFR calc Af Amer: 16 mL/min — ABNORMAL LOW (ref >60)
GFR calc non Af Amer: 14 mL/min — ABNORMAL LOW (ref >60)
Glucose, Bld: 91 mg/dL (ref 70–99)
Potassium: 3.5 mmol/L (ref 3.5–5.1)
Sodium: 135 mmol/L (ref 135–145)

## 2019-06-04 MED ORDER — SODIUM CHLORIDE 0.9 % IV SOLN
INTRAVENOUS | Status: DC
  Administered 2019-06-04 – 2019-06-05 (×5): via INTRAVENOUS

## 2019-06-04 MED ORDER — HYDRALAZINE HCL 25 MG PO TABS
25.0000 mg | ORAL_TABLET | Freq: Three times a day (TID) | ORAL | Status: DC | PRN
  Filled 2019-06-04: qty 1

## 2019-06-04 MED ORDER — CIPROFLOXACIN HCL 500 MG PO TABS
500.0000 mg | ORAL_TABLET | Freq: Once | ORAL | Status: AC
  Administered 2019-06-04: 500 mg via ORAL
  Filled 2019-06-04: qty 1

## 2019-06-04 MED ORDER — FINASTERIDE 5 MG PO TABS
5.0000 mg | ORAL_TABLET | Freq: Every day | ORAL | Status: DC
  Administered 2019-06-04 – 2019-06-09 (×6): 5 mg via ORAL
  Filled 2019-06-04 (×6): qty 1

## 2019-06-04 MED ORDER — SODIUM CHLORIDE 0.9 % IV BOLUS (SEPSIS): 1000.0000 mL | Freq: Once | INTRAVENOUS | Status: DC

## 2019-06-04 MED ORDER — ACETAMINOPHEN 325 MG PO TABS
650.0000 mg | ORAL_TABLET | Freq: Four times a day (QID) | ORAL | Status: DC | PRN
  Administered 2019-06-08: 650 mg via ORAL
  Filled 2019-06-04: qty 2

## 2019-06-04 MED ORDER — SODIUM CHLORIDE 0.9 % IV SOLN: INTRAVENOUS | Status: DC

## 2019-06-04 MED ORDER — ACETAMINOPHEN 650 MG RE SUPP: 650.0000 mg | Freq: Four times a day (QID) | RECTAL | Status: DC | PRN

## 2019-06-04 MED ORDER — AMLODIPINE BESYLATE 10 MG PO TABS
10.0000 mg | ORAL_TABLET | Freq: Every day | ORAL | Status: DC
  Administered 2019-06-04 – 2019-06-09 (×6): 10 mg via ORAL
  Filled 2019-06-04: qty 2
  Filled 2019-06-04 (×5): qty 1

## 2019-06-04 MED ORDER — NITROFURANTOIN MONOHYD MACRO 100 MG PO CAPS
100.0000 mg | ORAL_CAPSULE | Freq: Once | ORAL | Status: DC
  Filled 2019-06-04: qty 1

## 2019-06-04 MED ORDER — SODIUM CHLORIDE 0.9% FLUSH
3.0000 mL | Freq: Two times a day (BID) | INTRAVENOUS | Status: DC
  Administered 2019-06-04 – 2019-06-08 (×6): 3 mL via INTRAVENOUS

## 2019-06-04 NOTE — ED Triage Notes (Signed)
Pt self caths and tonight saw blood in his urine

## 2019-06-04 NOTE — ED Notes (Signed)
Patient repositioned and given meal tray.  Urine output documented.

## 2019-06-04 NOTE — Consult Note (Addendum)
H&P Physician requesting consult: Murray Hodgkins, MD  Chief Complaint: Hematuria, left renal mass  History of Present Illness: 73 year old male with a history of a left renal mass.  He has declined intervention in the past.  He presented on 06/03/2019 with gout.  He was found to have poor renal function and admission was recommended.  However, the patient declined.  Last night, the patient had an episode of gross hematuria after self catheterizing.  He says this is starting to lighten up.  He refuses Foley catheter.  Patient underwent a CT of the abdomen and pelvis without contrast on 06/01/2019.This revealed a 7.2 cm posterior upper left renal mass which was hypermetabolic on 19/14/7829 PET/CT and also enhanced on a CT of the abdomen on 03/24/2018.  The patient has previously declined intervention for the renal mass.  He denies any flank pain or dysuria.  He underwent a renal ultrasound that revealed no hydronephrosis bilaterally.  There was a filling defect in the bladder adjacent to an enlarged median lobe likely representing blood clot but incompletely evaluated.  The emergency department, creatinine was found to be 4.  Past Medical History:  Diagnosis Date  . Abnormal CT of liver    a. nodular contour suggesting cirrhosis 05/2018.  [name] Abnormal LFTs (liver function tests)   . Anxiety   . Aortic root dilatation ([place])   . Ascending aortic aneurysm ([place])   . BPH with urinary obstruction   . Bradycardia   . 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 CAD (coronary artery disease)    a. 12/2015 NSTEMI/Cath (in setting of PAF):  LM nl, LAD 30p,  LCX 53m, RCA  ok, AM 100%, RPDA1 40, RPDA 2 60 ->Med Rx.  . Childhood asthma   . Chronic diastolic CHF (congestive heart failure) ([place])    a. 12/2015 Echo: EF 50-55%, mod AI, mod Ao root dil, mild MR, mod dil LA, mod RA.  . CKD (chronic kidney disease), stage III   . Colon  cancer ([place])   . COPD (chronic obstructive pulmonary disease) ([place])    pt denies at preop  . Diabetes mellitus type II    diet controlled  . History of syncope   . Hyperlipidemia   . Hypertensive heart disease   . Kidney lump 04/04/2010   Overview:  Renal Cell Carcinoma   . Light chain myeloma ([place])   . Moderate aortic insufficiency    a. 12/2015 Echo: Mod AI.  [name] Paroxysmal atrial fibrillation ([place])    a. 12/2015 started on Xarelto (CHA2DS2VASc = 4-5).  . Pneumonia   . Prostate cancer ([place])   . Renal cell carcinoma ([place])   . Sleep apnea    Does not like  CPAP  . Spinal stenosis in cervical region 10/12/2016  . Venous insufficiency    Past Surgical History:  Procedure Laterality Date  . BACK SURGERY    . CARDIAC CATHETERIZATION N/A 12/18/2015   Procedure: Left Heart Cath and Coronary Angiography;  Surgeon: Leonie Man, MD;  Location: [facility];  Service: Cardiovascular;  Laterality: N/A;  . DIAGNOSTIC LAPAROSCOPY     partial colectomy  . LAPAROSCOPIC PARTIAL COLECTOMY Right 05/21/2018   Procedure: LAPAROSCOPIC RIGHT  COLECTOMY ERAS PATHWAY;  Surgeon: Leighton Ruff, MD;  Location: [facility];  Service: General;  Laterality: Right;  . [place]   "replaced a disc"    Home Medications:  Medications  Prior to Admission  Medication Sig Dispense Refill Last Dose  . amLODipine (NORVASC) 10 MG tablet Take 1 tablet (10 mg total) by mouth daily. 90 tablet 0 Past Week at Unknown time  . atorvastatin (LIPITOR) 80 MG tablet Take 1 tablet (80 mg total) by mouth daily. 90 tablet 0 Past Week at Unknown time  . cetirizine (ZYRTEC) 10 MG tablet Take 10 mg by mouth daily.   Past Week at Unknown time  . cholecalciferol (VITAMIN D3) 25 MCG (1000 UT) tablet Take 1,000 Units by mouth daily.   Past Week at Unknown time  . fluticasone (FLONASE) 50 MCG/ACT nasal spray Place 2 sprays into both nostrils daily.   Past Month at Unknown time  . ferrous sulfate (FERROUSUL) 325 (65 FE) MG  tablet Take 1 tablet (325 mg total) by mouth 2 (two) times daily with a meal. (Patient not taking: Reported on 06/03/2019) 60 tablet 3 Not Taking at Unknown time  . glucose blood test strip Check blood sugar twice per day. E.11.9 100 each 12   . HYDROcodone-acetaminophen (NORCO/VICODIN) 5-325 MG tablet Take 0.5-1 tablets by mouth every 6 (six) hours as needed for up to 5 days for severe pain (That is not improved by your scheduled acetaminophen regimen). Please do not exceed 4000 mg of acetaminophen (Tylenol) a 24-hour period. Please note that he may be prescribed additional medicine that contains acetaminophen. 15 tablet 0 not started   Allergies:  Allergies  Allergen Reactions  . Amoxicillin Other (See Comments)    Tolerates Unasyn. Can't move Has patient had a PCN reaction causing immediate rash, facial/tongue/throat swelling, SOB or lightheadedness with hypotension: No Has patient had a PCN reaction causing severe rash involving mucus membranes or skin necrosis: No Has patient had a PCN reaction that required hospitalization No Has patient had a PCN reaction occurring within the last 10 years: No If all of the above answers are "NO", then may proceed with Cephalosporin use.   [name] Penicillins Other (See Comments)    Tolerates Unasyn. Can't move/dizziness Has patient had a PCN reaction causing immediate rash, facial/tongue/throat swelling, SOB or lightheadedness with hypotension: No Has patient had a PCN reaction causing severe rash involving mucus membranes or skin necrosis: No Has patient had a PCN reaction that required hospitalization No Has patient had a PCN reaction occurring within the last 10 years: No If all of the above answers are "NO", then may proceed with Cephalosporin use.  Other reaction(s): O    Family History  Problem Relation Age of Onset  . Emphysema Father   . Asthma Father   . Liver disease Father        tumor  . Heart disease Mother   . Hypertension Mother   .  Asthma Sister   . Hypertension Son    Social History:  reports that he quit smoking about 41 years ago. His smoking use included cigars. He has never used smokeless tobacco. He reports previous alcohol use. He reports that he does not use drugs.  ROS: A complete review of systems was performed.  All systems are negative except for pertinent findings as noted. ROS   Physical Exam:  Vital signs in last 24 hours: Temp:  [97.6 F (36.4 C)-98.5 F (36.9 C)] 97.6 F (36.4 C) (10/02 1400) Pulse Rate:  [49-68] 56 (10/02 1400) Resp:  [12-22] 18 (10/02 1400) BP: (139-177)/(84-97) 139/90 (10/02 1400) SpO2:  [96 %-100 %] 100 % (10/02 1400) Weight:  [97.5 kg] 97.5 kg (10/02 1408) General:  Alert and oriented, No acute distress HEENT: Normocephalic, atraumatic Neck: No JVD or lymphadenopathy Cardiovascular: Regular rate and rhythm Lungs: Regular rate and effort Abdomen: Soft, nontender, nondistended, no abdominal masses Back: No CVA tenderness Extremities: No edema Neurologic: Grossly intact  Laboratory Data:  Results for orders placed or performed during the hospital encounter of 06/04/19 (from the past 24 hour(s))  CBC with Differential     Status: Abnormal   Collection Time: 06/04/19  4:45 AM  Result Value Ref Range   WBC 6.0 4.0 - 10.5 K/uL   RBC 2.76 (L) 4.22 - 5.81 MIL/uL   Hemoglobin 9.0 (L) 13.0 - 17.0 g/dL   HCT 28.0 (L) 39.0 - 52.0 %   MCV 101.4 (H) 80.0 - 100.0 fL   MCH 32.6 26.0 - 34.0 pg   MCHC 32.1 30.0 - 36.0 g/dL   RDW 12.7 11.5 - 15.5 %   Platelets 218 150 - 400 K/uL   nRBC 0.0 0.0 - 0.2 %   Neutrophils Relative % 65 %   Neutro Abs 3.9 1.7 - 7.7 K/uL   Lymphocytes Relative 23 %   Lymphs Abs 1.4 0.7 - 4.0 K/uL   Monocytes Relative 10 %   Monocytes Absolute 0.6 0.1 - 1.0 K/uL   Eosinophils Relative 2 %   Eosinophils Absolute 0.1 0.0 - 0.5 K/uL   Basophils Relative 0 %   Basophils Absolute 0.0 0.0 - 0.1 K/uL   Immature Granulocytes 0 %   Abs Immature  Granulocytes 0.02 0.00 - 0.07 K/uL  Basic metabolic panel     Status: Abnormal   Collection Time: 06/04/19  4:45 AM  Result Value Ref Range   Sodium 135 135 - 145 mmol/L   Potassium 3.5 3.5 - 5.1 mmol/L   Chloride 100 98 - 111 mmol/L   CO2 23 22 - 32 mmol/L   Glucose, Bld 91 70 - 99 mg/dL   BUN 52 (H) 8 - 23 mg/dL   Creatinine, Ser 3.97 (H) 0.61 - 1.24 mg/dL   Calcium 9.2 8.9 - 10.3 mg/dL   GFR calc non Af Amer 14 (L) >60 mL/min   GFR calc Af Amer 16 (L) >60 mL/min   Anion gap 12 5 - 15  Protime-INR     Status: None   Collection Time: 06/04/19  4:45 AM  Result Value Ref Range   Prothrombin Time 11.4 11.4 - 15.2 seconds   INR 0.8 0.8 - 1.2  Urinalysis, Routine w reflex microscopic     Status: Abnormal   Collection Time: 06/04/19  4:49 AM  Result Value Ref Range   Color, Urine RED (A) YELLOW   APPearance TURBID (A) CLEAR   Specific Gravity, Urine 1.015 1.005 - 1.030   pH 6.5 5.0 - 8.0   Glucose, UA NEGATIVE NEGATIVE mg/dL   Hgb urine dipstick LARGE (A) NEGATIVE   Bilirubin Urine NEGATIVE NEGATIVE   Ketones, ur NEGATIVE NEGATIVE mg/dL   Protein, ur >300 (A) NEGATIVE mg/dL   Nitrite POSITIVE (A) NEGATIVE   Leukocytes,Ua TRACE (A) NEGATIVE  Urinalysis, Microscopic (reflex)     Status: Abnormal   Collection Time: 06/04/19  4:49 AM  Result Value Ref Range   RBC / HPF >50 0 - 5 RBC/hpf   WBC, UA 0-5 0 - 5 WBC/hpf   Bacteria, UA FEW (A) NONE SEEN   Squamous Epithelial / LPF 0-5 0 - 5  I-stat chem 8, ED (not at [facility] or [facility])     Status: Abnormal   Collection  Time: 06/04/19  4:56 AM  Result Value Ref Range   Sodium 137 135 - 145 mmol/L   Potassium 3.3 (L) 3.5 - 5.1 mmol/L   Chloride 99 98 - 111 mmol/L   BUN 48 (H) 8 - 23 mg/dL   Creatinine, Ser 4.00 (H) 0.61 - 1.24 mg/dL   Glucose, Bld 88 70 - 99 mg/dL   Calcium, Ion 1.19 1.15 - 1.40 mmol/L   TCO2 25 22 - 32 mmol/L   Hemoglobin 9.5 (L) 13.0 - 17.0 g/dL   HCT 28.0 (L) 39.0 - 52.0 %  CBC     Status: Abnormal   Collection  Time: 06/04/19 11:31 AM  Result Value Ref Range   WBC 5.1 4.0 - 10.5 K/uL   RBC 2.44 (L) 4.22 - 5.81 MIL/uL   Hemoglobin 8.0 (L) 13.0 - 17.0 g/dL   HCT 24.6 (L) 39.0 - 52.0 %   MCV 100.8 (H) 80.0 - 100.0 fL   MCH 32.8 26.0 - 34.0 pg   MCHC 32.5 30.0 - 36.0 g/dL   RDW 12.8 11.5 - 15.5 %   Platelets 189 150 - 400 K/uL   nRBC 0.0 0.0 - 0.2 %   No results found for this or any previous visit (from the past 240 hour(s)). Creatinine: Recent Labs    06/03/19 0419 06/04/19 0445 06/04/19 0456  CREATININE 3.83* 3.97* 4.00*   CT scan personally reviewed and is detailed in history of present illness.  Impression/Assessment:  Gross hematuria Left renal neoplasm  Plan:  Acute renal insufficiency unlikely to be related to bladder outlet obstruction due to the absence of hydronephrosis bilaterally.  Agree with admission.  Continue to follow hemoglobin given his gross hematuria.  However, this appears to be lightening up.  I will order finasteride in the event that this hematuria may be from the prostate.  He will need to follow-up for a cystoscopy outpatient.  Marton Redwood, III 06/04/2019, 4:36 PM

## 2019-06-04 NOTE — H&P (Signed)
History and Physical  [name] [name] DTO:[id number] DOB: 1945-09-30 DOA: 06/04/2019  PCP: Scot Jun, FNP  Cardiologist Marijo File, MD Oncologist: Sullivan Lone, MD  Chief Complaint: Blood in urine  HPI:  73 year old man PMH renal cell carcinoma, prostate cancer, multiple myeloma COPD, CKD stage III PRESENTED to [facility] emergency Department for hematuria beginning after self-catheterization (BPH).  Found to have acute kidney injury.  Seen in the emergency department 10/1 for left great toe pain, diagnosed with gout, found to have worsening renal function, admission recommended but patient declined at that time.  Last evening the patient self catheterized and developed acute hematuria and so came to the emergency department for further evaluation where he was found to have gross hematuria.  He denies any bladder pain.  Review of systems is unremarkable.  No COVID symptoms or known exposure.  No specific aggravating or alleviating factors.  He reports that he has follow-up with his urologist on the 16th.  He is aware of his diagnosis of myeloma and suspected kidney cancer but did not seem concerned about this.  He has not been eating very well but drinks a lot of water.  Chart review  Office visit PCP 7/16: Patient stopped follow-up with cancer center, stopped following up with urology.  Palliative care discussion was had which angered the patient.  Multiple attempts to contact family members to discuss treatment unsuccessful.  Patient denied that he had cancer.  Cardiology office visit 3/5: Follow-up diastolic CHF, CAD, palpitations.  Noted to not always be compliant with medications, possibly related to religious beliefs.  Discharge 10/23/2018: Admitted for palpitations  Oncology office visit 1/17: Light chain multiple myeloma.  Plan was for treatment at that time.  ED Course: Treated with IV fluids, ciprofloxacin, amlodipine  Review of Systems:  Negative for fever, new visual  changes, sore throat, rash, new muscle aches, chest pain, SOB, dysuria, n/v/abdominal pain.  Past Medical History:  Diagnosis Date  . Abnormal CT of liver    a. nodular contour suggesting cirrhosis 05/2018.  [name] Abnormal LFTs (liver function tests)   . Anxiety   . Aortic root dilatation ([place])   . Ascending aortic aneurysm ([place])   . BPH with urinary obstruction   . Bradycardia   . 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 CAD (coronary artery disease)    a. 12/2015 NSTEMI/Cath (in setting of PAF):  LM nl, LAD 30p,  LCX 52m RCA  ok, AM 100%, RPDA1 40, RPDA 2 60 ->Med Rx.  . Childhood asthma   . Chronic diastolic CHF (congestive heart failure) ([place]    a. 12/2015 Echo: EF 50-55%, mod AI, mod Ao root dil, mild MR, mod dil LA, mod RA.  . CKD (chronic kidney disease), stage III   . Colon cancer ([place]   . COPD (chronic obstructive pulmonary disease) ([place]    pt denies at preop  . Diabetes mellitus type II    diet controlled  . History of syncope   . Hyperlipidemia   . Hypertensive heart disease   . Kidney lump 04/04/2010   Overview:  Renal Cell Carcinoma   . Light chain myeloma ([place]   . Moderate aortic insufficiency    a. 12/2015 Echo: Mod AI.  .Marland KitchenParoxysmal atrial fibrillation ([place]    a. 12/2015 started on Xarelto (CHA2DS2VASc = 4-5).  . Pneumonia   . Prostate cancer ([place]   . Renal cell carcinoma ([place]   .  Sleep apnea    Does not like  CPAP  . Spinal stenosis in cervical region 10/12/2016  . Venous insufficiency     Past Surgical History:  Procedure Laterality Date  . BACK SURGERY    . CARDIAC CATHETERIZATION N/A 12/18/2015   Procedure: Left Heart Cath and Coronary Angiography;  Surgeon: Leonie Man, MD;  Location: [facility];  Service: Cardiovascular;  Laterality: N/A;  . DIAGNOSTIC LAPAROSCOPY     partial colectomy  . LAPAROSCOPIC PARTIAL COLECTOMY Right 05/21/2018   Procedure:  LAPAROSCOPIC RIGHT  COLECTOMY ERAS PATHWAY;  Surgeon: Leighton Ruff, MD;  Location[facility];  Service: General;  Laterality: Right;  . [place]   "replaced a disc"     reports that he quit smoking about 41 years ago. His smoking use included cigars. He has never used smokeless tobacco. He reports previous alcohol use. He reports that he does not use drugs. Mobility: Ambulatory without assistant devices.  No falls.  Lives alone.  Allergies  Allergen Reactions  . Amoxicillin Other (See Comments)    Tolerates Unasyn. Can't move Has patient had a PCN reaction causing immediate rash, facial/tongue/throat swelling, SOB or lightheadedness with hypotension: No Has patient had a PCN reaction causing severe rash involving mucus membranes or skin necrosis: No Has patient had a PCN reaction that required hospitalization No Has patient had a PCN reaction occurring within the last 10 years: No If all of the above answers are "NO", then may proceed with Cephalosporin use.   [name] Penicillins Other (See Comments)    Tolerates Unasyn. Can't move/dizziness Has patient had a PCN reaction causing immediate rash, facial/tongue/throat swelling, SOB or lightheadedness with hypotension: No Has patient had a PCN reaction causing severe rash involving mucus membranes or skin necrosis: No Has patient had a PCN reaction that required hospitalization No Has patient had a PCN reaction occurring within the last 10 years: No If all of the above answers are "NO", then may proceed with Cephalosporin use.  Other reaction(s): O    Family History  Problem Relation Age of Onset  . Emphysema Father   . Asthma Father   . Liver disease Father        tumor  . Heart disease Mother   . Hypertension Mother   . Asthma Sister   . Hypertension Son      Prior to Admission medications   Medication Sig Start Date End Date Taking? Authorizing Provider  amLODipine (NORVASC) 10 MG tablet Take 1 tablet (10 mg  total) by mouth daily. 05/17/19  Yes Fulp, Cammie, MD  atorvastatin (LIPITOR) 80 MG tablet Take 1 tablet (80 mg total) by mouth daily. 05/17/19  Yes Fulp, Cammie, MD  cetirizine (ZYRTEC) 10 MG tablet Take 10 mg by mouth daily.   Yes [provider]  cholecalciferol (VITAMIN D3) 25 MCG (1000 UT) tablet Take 1,000 Units by mouth daily.   Yes [provider]  fluticasone (FLONASE) 50 MCG/ACT nasal spray Place 2 sprays into both nostrils daily. 08/18/18  Yes [provider]  ferrous sulfate (FERROUSUL) 325 (65 FE) MG tablet Take 1 tablet (325 mg total) by mouth 2 (two) times daily with a meal. Patient not taking: Reported on 06/03/2019 10/06/18   Scot Jun, FNP  glucose blood test strip Check blood sugar twice per day. E.11.9 10/06/18   Scot Jun, FNP  HYDROcodone-acetaminophen (NORCO/VICODIN) 5-325 MG tablet Take 0.5-1 tablets by mouth every 6 (six) hours as needed for  up to 5 days for severe pain (That is not improved by your scheduled acetaminophen regimen). Please do not exceed 4000 mg of acetaminophen (Tylenol) a 24-hour period. Please note that he may be prescribed additional medicine that contains acetaminophen. 06/03/19 06/08/19  Fatima Blank, MD    Physical Exam: Vitals:   06/04/19 0745 06/04/19 0830  BP: (!) 172/93 (!) 166/96  Pulse: 61 (!) 51  Resp: (!) 22 18  Temp:    SpO2: 97% 100%    Constitutional:   . Appears calm and comfortable lying on stretcher in the emergency department. Eyes:  . pupils and irises appear unremarkable.  Arcus senilis noted bilaterally. . Normal lids and conjunctivae ENMT:  . grossly normal hearing  . Lips appear normal.  Tongue appears unremarkable. Neck:  . neck appears normal, no masses . no thyromegaly Respiratory:  . CTA bilaterally, no w/r/r.  . Respiratory effort normal.  Cardiovascular:  . RRR, no m/r/g . 1+ bilateral LE extremity edema   Abdomen:  . Soft, nontender, nondistended. . Ventral  hernia noted, nontender.  Small umbilical hernia noted, nontender. Musculoskeletal:  . Digits/nails BUE: no clubbing, cyanosis, petechiae, infection . RUE, LUE, RLE, LLE   . Tone appears grossly normal . Left great toe appears unremarkable.  No erythema or edema.  Nontender to palpation. Skin:  . No rashes, lesions, ulcers . palpation of skin: no induration or nodules Psychiatric:  . Mental status o Mood, affect appropriate o Oriented to person, location, month, year . judgment and insight difficult to gauge   I have personally reviewed following labs and imaging studies  Labs:   Potassium 3.3, BUN 48, creatinine 4.0, calcium 9.2  Hemoglobin stable at 9.5  SARS-CoV-2 pending  Urinalysis positive for RBC, nitrates, trace leukocytes, 0-5 WBC  Imaging studies:   Left foot film reviewed, suggest gout first great toe on the left.  Medical tests:   EKG independently reviewed: Sinus bradycardia, T wave inversion inferolaterally, no acute changes compared to previous study 11/30/2018.  Principal Problem:   AKI (acute kidney injury) ([place]) Active Problems:   Light chain myeloma ([place])   CKD (chronic kidney disease), stage III   Renal cell carcinoma ([place])   Gross hematuria   Assessment/Plan AKI superimposed on CKD stage III with gross hematuria.  Creatinine most recently 1.5 08 October 2018 and 1.6 02 November 2018.  CT abdomen and pelvis 9/29 showed chronic mild diffuse bladder wall thickening probably secondary to chronic bladder outlet obstruction by markedly enlarged prostate.  No hydronephrosis at that time. --When admitted October 2019 he was seen by nephrology for AKI which was thought to be multifactorial including UTI, obstruction, monoclonal process --Suspect postrenal/obstructive etiology based on history and related to enlarged prostate.  Patient voiding freely and copiously in the emergency department.  No clots noted in urinal.  However cannot rule out involvement from  kidney masses and involvement from myeloma. --Aggressive IV fluids, strict I/O, check renal ultrasound, trend CBC, place foley --Will consult nephrology --Will consult urology  Acute gout left great toe.  Imaging 10/1 consistent with gout. --Seems to be improving.  No gross abnormalities noted nor significant pain with manipulation. --Supportive care.  Light chain myeloma.  Calcium within normal limits. --Will notify oncology of admission.  Left renal cell carcinoma diagnosed February 2020.  CT abdomen pelvis 9/29 showed significant interval growth left renal cortical mass which was hypermetabolic on PET scan December 2019, findings compatible with large growing renal cell carcinoma.  Numerous additional complex  bilateral renal masses were noted and could be additional renal cell carcinomas.  MRI with and without contrast recommended recommended. --Patient reports he has urology follow-up on the 16th.  His records notable for noncompliance or disinclination to treat.  Goals of care not clear. --Given worsening on recent scan as well as hematuria, will involve urology.  Chronic diastolic CHF --Very mild bilateral lower extremity edema.  Monitor volume status.  Not on any chronic medications.  Not on diuretic.  Diabetes mellitus type 2? --Patient denies this diagnosis and takes no medications for this.  His hemoglobin A1c was 5.4 in January 2020. --Random blood sugars here less than 100. --No inpatient treatment warranted at this time.  COPD? --Patient denies this diagnosis.  Appears asymptomatic.  Follow clinically.  Paroxysmal atrial fibrillation --Currently sinus bradycardia.  Not on any rate control agents.  EKG nonacute.  Patient declined to take anticoagulation in the past and would certainly not be a candidate at this time.  Ventral abdominal hernia --Asymptomatic.  No signs of obstruction.  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   --Recommend follow-up aortic ultrasound in 5 years.  Aortic atherosclerosis --Consider statin as outpatient  COVID likelihood: Seems quite low, asymptomatic.  No known contacts.  SARS-CoV-2 pending  Complex case, per oncology office they have been unable to track him down or contact him for follow-up treatment.  I am concerned that this patient will be lost to follow-up, he has multiple issues that require specialist input and given history of noncompliance and difficulty in contacting, will consult nephrology, urology and oncology for assistance and treatment plan for acute issues as well as going into the future.  The patient has been resistant to palliative care in the past.  Patient is alert and oriented but does not seem concerned about multiple issues.  As seen on in the office recently he became quite angry when palliative care was discussed therefore will hold off on consultation of that service at this time.  Severity of Illness: The appropriate patient status for this patient is INPATIENT. Inpatient status is judged to be reasonable and necessary in order to provide the required intensity of service to ensure the patient's safety. The patient's presenting symptoms, physical exam findings, and initial radiographic and laboratory data in the context of their chronic comorbidities is felt to place them at high risk for further clinical deterioration. Furthermore, it is not anticipated that the patient will be medically stable for discharge from the hospital within 2 midnights of admission. The following factors support the patient status of inpatient.   " The patient's presenting symptoms include blood in urine. " The worrisome physical exam findings include gross hematuria. " The initial radiographic and laboratory data are worrisome because of acute kidney injury, worsening kidney masses. " The chronic co-morbidities include suspected RCC, light chain myeloma, CKD stage III.   * I  certify that at the point of admission it is my clinical judgment that the patient will require inpatient hospital care spanning beyond 2 midnights from the point of admission due to high intensity of service, high risk for further deterioration and high frequency of surveillance required.*   DVT prophylaxis:SCDs (hematuria) Code Status: Full Family Communication: patient asked for me to contact son [name]. No answer at number. Consults called: nephrology Jonnie Finner), oncology (Dr Grier Mitts RN), urology Gloriann Loan)   Time spent: 70 minutes  Murray Hodgkins, MD  [facility] Direct contact: see [email or web address]  7PM-7AM contact night coverage as below  1. Check the care team in [facility] and look for a) attending/consulting [facility] provider listed and b) the [name] team listed 2. Log into [email or web address] and use [place]'s universal password to access. If you do not have the password, please contact the hospital operator. 3. Locate the [facility] provider you are looking for under [facility] and page to a number that you can be directly reached. 4. If you still have difficulty reaching the provider, please page the [facility] (Director on Call) for the Hospitalists listed on amion for assistance.   06/04/2019, 9:17 AM

## 2019-06-04 NOTE — Consult Note (Signed)
Renal Service Consult Note [facility]  [name] [name] 06/04/2019 [name] [name] Requesting Physician: Dr Sarajane Jews  Reason for Consult:  Renal failure HPI: The patient is a 73 y.o. year-old with hx of CKD3, BPH, urinary retention, multiple myeloma, COPD and renal cell carcinoma seen in ED yest for gout pain on 10/1 and was found to  have ^'d creatinine so was going to be admitted but pt refused. At home then last evening he was attempting to self-cath and developed acute hematuria and came to hospital and was admitted.  Creat was 4.0.  Prior to that creat at baseline is around 1.6- 2.5. Asked to see for renal failure.   Patient denies any difficutly voiding, fevers, nausea, vomiting, diarrhea, painful voiding. No SOB, no CP or wt loss, appetite and energy are normal.       ROS  denies CP  no joint pain   no HA  no blurry vision  no rash  no diarrhea  no nausea/ vomiting  no dysuria  no difficulty voiding  no change in urine color    Past Medical History  Past Medical History:  Diagnosis Date  . Abnormal CT of liver    a. nodular contour suggesting cirrhosis 05/2018.  [name] Abnormal LFTs (liver function tests)   . Anxiety   . Aortic root dilatation ([place])   . Ascending aortic aneurysm ([place])   . BPH with urinary obstruction   . Bradycardia   . 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 CAD (coronary artery disease)    a. 12/2015 NSTEMI/Cath (in setting of PAF):  LM nl, LAD 30p,  LCX 44m RCA  ok, AM 100%, RPDA1 40, RPDA 2 60 ->Med Rx.  . Childhood asthma   . Chronic diastolic CHF (congestive heart failure) ([place]    a. 12/2015 Echo: EF 50-55%, mod AI, mod Ao root dil, mild MR, mod dil LA, mod RA.  . CKD (chronic kidney disease), stage III   . Colon cancer ([place]   . COPD (chronic obstructive pulmonary disease) ([place]    pt denies at preop  . Diabetes mellitus type II    diet controlled   . History of syncope   . Hyperlipidemia   . Hypertensive heart disease   . Kidney lump 04/04/2010   Overview:  Renal Cell Carcinoma   . Light chain myeloma ([place]   . Moderate aortic insufficiency    a. 12/2015 Echo: Mod AI.  .Marland KitchenParoxysmal atrial fibrillation ([place]    a. 12/2015 started on Xarelto (CHA2DS2VASc = 4-5).  . Pneumonia   . Prostate cancer ([place]   . Renal cell carcinoma ([place]   . Sleep apnea    Does not like  CPAP  . Spinal stenosis in cervical region 10/12/2016  . Venous insufficiency    Past Surgical History  Past Surgical History:  Procedure Laterality Date  . BACK SURGERY    . CARDIAC CATHETERIZATION N/A 12/18/2015   Procedure: Left Heart Cath and Coronary Angiography;  Surgeon: DLeonie Man MD;  [facility];  Service: Cardiovascular;  Laterality: N/A;  . DIAGNOSTIC LAPAROSCOPY     partial colectomy  . LAPAROSCOPIC PARTIAL COLECTOMY Right 05/21/2018   Procedure: LAPAROSCOPIC RIGHT  COLECTOMY ERAS PATHWAY;  Surgeon: TLeighton Ruff MD;  [facility];  Service: General;  Laterality: Right;  . [place]  "replaced a disc"   Family  History  Family History  Problem Relation Age of Onset  . Emphysema Father   . Asthma Father   . Liver disease Father        tumor  . Heart disease Mother   . Hypertension Mother   . Asthma Sister   . Hypertension Son    Social History  reports that he quit smoking about 41 years ago. His smoking use included cigars. He has never used smokeless tobacco. He reports previous alcohol use. He reports that he does not use drugs. Allergies  Allergies  Allergen Reactions  . Amoxicillin Other (See Comments)    Tolerates Unasyn. Can't move Has patient had a PCN reaction causing immediate rash, facial/tongue/throat swelling, SOB or lightheadedness with hypotension: No Has patient had a PCN reaction causing severe rash involving mucus membranes or skin necrosis: No Has patient had a PCN reaction that  required hospitalization No Has patient had a PCN reaction occurring within the last 10 years: No If all of the above answers are "NO", then may proceed with Cephalosporin use.   [name] Penicillins Other (See Comments)    Tolerates Unasyn. Can't move/dizziness Has patient had a PCN reaction causing immediate rash, facial/tongue/throat swelling, SOB or lightheadedness with hypotension: No Has patient had a PCN reaction causing severe rash involving mucus membranes or skin necrosis: No Has patient had a PCN reaction that required hospitalization No Has patient had a PCN reaction occurring within the last 10 years: No If all of the above answers are "NO", then may proceed with Cephalosporin use.  Other reaction(s): O   Home medications Prior to Admission medications   Medication Sig Start Date End Date Taking? Authorizing Provider  amLODipine (NORVASC) 10 MG tablet Take 1 tablet (10 mg total) by mouth daily. 05/17/19  Yes Fulp, Cammie, MD  atorvastatin (LIPITOR) 80 MG tablet Take 1 tablet (80 mg total) by mouth daily. 05/17/19  Yes Fulp, Cammie, MD  cetirizine (ZYRTEC) 10 MG tablet Take 10 mg by mouth daily.   Yes [provider]  cholecalciferol (VITAMIN D3) 25 MCG (1000 UT) tablet Take 1,000 Units by mouth daily.   Yes [provider]  fluticasone (FLONASE) 50 MCG/ACT nasal spray Place 2 sprays into both nostrils daily. 08/18/18  Yes [provider]  ferrous sulfate (FERROUSUL) 325 (65 FE) MG tablet Take 1 tablet (325 mg total) by mouth 2 (two) times daily with a meal. Patient not taking: Reported on 06/03/2019 10/06/18   Scot Jun, FNP  glucose blood test strip Check blood sugar twice per day. E.11.9 10/06/18   Scot Jun, FNP  HYDROcodone-acetaminophen (NORCO/VICODIN) 5-325 MG tablet Take 0.5-1 tablets by mouth every 6 (six) hours as needed for up to 5 days for severe pain (That is not improved by your scheduled acetaminophen regimen). Please do not exceed  4000 mg of acetaminophen (Tylenol) a 24-hour period. Please note that he may be prescribed additional medicine that contains acetaminophen. 06/03/19 06/08/19  Fatima Blank, MD   Liver Function Tests Recent Labs  Lab 06/03/19 0419  AST 39  ALT 48*  ALKPHOS 172*  BILITOT 0.7  PROT 7.7  ALBUMIN 4.3   No results for input(s): LIPASE, AMYLASE in the last 168 hours. CBC Recent Labs  Lab 06/03/19 0419 06/04/19 0445 06/04/19 0456 06/04/19 1131 06/04/19 1603  WBC 6.4 6.0  --  5.1 4.3  NEUTROABS 4.2 3.9  --   --   --   HGB 8.4* 9.0* 9.5* 8.0* 10.0*  HCT  26.6* 28.0* 28.0* 24.6* 30.9*  MCV 100.8* 101.4*  --  100.8* 101.6*  PLT 216 218  --  189 025   Basic Metabolic Panel Recent Labs  Lab 06/03/19 0419 06/04/19 0445 06/04/19 0456  NA 138 135 137  K 3.3* 3.5 3.3*  CL 103 100 99  CO2 24 23  --   GLUCOSE 97 91 88  BUN 53* 52* 48*  CREATININE 3.83* 3.97* 4.00*  CALCIUM 9.4 9.2  --    Iron/TIBC/Ferritin/ %Sat    Component Value Date/Time   IRON 56 10/06/2018 1541   TIBC 264 10/06/2018 1541   FERRITIN 321 10/06/2018 1541   IRONPCTSAT 21 10/06/2018 1541    Vitals:   06/04/19 1217 06/04/19 1300 06/04/19 1400 06/04/19 1408  BP:  (!) 169/95 139/90   Pulse: (!) 55 (!) 53 (!) 56   Resp: _0 Temp:   97.6 F (36.4 C)   TempSrc:   Oral   SpO2: 100% 96% 100%   Weight:    97.5 kg  Height:    5' 7.01" (1.702 m)    Exam Gen alert, calm and no distress No rash, cyanosis or gangrene Sclera anicteric, throat clear and moist No jvd or bruits, flat neck veins sitting up Chest clear bilat no rales or wheezing RRR no MRG Abd soft ntnd no mass or ascites +bs  GU normal male MS no joint effusions or deformity Ext no edema, no wounds or ulcers Neuro is alert, Ox 3 , nf    Home meds:  - amlodipine 10  - atorvastatin 80 qd   - hydrocodone - aceta qid prn  - prn's/ vitamins/ supplements    UA 10/2 > turbid , red, large Hb, >50 rbc, 6.55, >300 prot, 0-5 wbc/  epi  Renal US > 10- 13cm kidneys, no hydro, +renal cysts and known renal mass as before  Na 135 K 3.5  CO2 23  BUN 48  Cr 4.00   Ca 9.2  Alb 4.3  ST 39/ALT 40 Tbili 0.7  Tprot 7.7  eGFR 14  Hb 10  WBC 4K  [id number]  Baseline Creat Jan - March 2020 = 1.67- 2.38   eGFR 30- 50   Assessment/ Plan: 1. AKI on CKD3 - unclear cause, need details about myeloma status. Creat 4.0, w/ baseline creat 1.6- 2.3.  No vol excess, agree w/ IVF"s and f/u labs. Has hx BPH and acute hematuria, but no hydro on CT or [place].  BP's normal and clinically quite stable w/o evidence of uremia. Get urine lytes. Avoid nsaid's/ ACEi's / ARB's and contrast.  Will follow.  2. Hematuria - suspected trauma from attempted self-cath, hx BPH.  3. Renal mass - urology following 4. HTN - on amlodipine 5. COPD 6. Multiple myeloma - get details     Kelly Splinter  MD 06/04/2019, 7:35 PM

## 2019-06-04 NOTE — Care Management (Signed)
This is a no charge note  Pending admission per Dr. Leonides Schanz  73 year old male with a past medical history of renal cell carcinoma, prostate cancer, BPH, hypertension, hyperlipidemia, COPD, fCHF, CAD, multiple myeloma, anxiety, CKD stage III, who presents with hematuria.  Hematuria started after self cath due to BPH. Found to have worsening renal function.  Creatinine increased from 1.6 now 11/30/2018 to 4.00, BUN 48.  Patient was diagnosed with right foot great toe gout yesterday and was started of Vicodin. Pt is placed in tele bed for obs.   [name] Costa, MD  [facility]   If 7PM-7AM, please contact night-coverage [email or web address] Password [email or web address] 06/04/2019, 5:25 AM

## 2019-06-04 NOTE — ED Provider Notes (Addendum)
TIME SEEN: 3:08 AM  CHIEF COMPLAINT: Hematuria  HPI: Patient is a 73 year old male with history of CHF, COPD, chronic kidney disease, hypertension, hyperlipidemia, multiple myeloma, renal cell carcinoma, BPH who has to self catheterize who presents to the emergency department hematuria.  States that he noticed frank blood without clots after he catheterized today.  He was seen yesterday for gout and was found to have acute on chronic renal failure.  They recommended admission at that time which he declined.  He states that he does not have follow-up with his doctors until the 16th.  He states having to wait this longer, seeing blood and knowing that his creatinine was elevated made him very concerned today and he return for admission.  He states he feels like his heart has been pounding but no chest pain or shortness of breath.  No lightheadedness.  He is not on antiplatelets or anticoagulants.  Has a large ventral abdominal hernia that is "getting larger" over time but no abdominal pain, vomiting or diarrhea.  It appears left renal carcinoma was a recent diagnosis in February 2020.  It does not appear patient has had a nephrectomy.  He did not have any hematuria prior to self catheterizing.  ROS: See HPI Constitutional: no fever  Eyes: no drainage  ENT: no runny nose   Cardiovascular:  no chest pain  Resp: no SOB  GI: no vomiting GU: no dysuria; + hematuria Integumentary: no rash  Allergy: no hives  Musculoskeletal: no leg swelling  Neurological: no slurred speech ROS otherwise negative  PAST MEDICAL HISTORY/PAST SURGICAL HISTORY:  Past Medical History:  Diagnosis Date  . Abnormal CT of liver    a. nodular contour suggesting cirrhosis 05/2018.  [name] Abnormal LFTs (liver function tests)   . Anxiety   . Aortic root dilatation ([place])   . Ascending aortic aneurysm ([place])   . BPH with urinary obstruction   . Bradycardia   . 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 CAD (coronary artery disease)    a. 12/2015 NSTEMI/Cath (in setting of PAF):  LM nl, LAD 30p,  LCX 97m RCA  ok, AM 100%, RPDA1 40, RPDA 2 60 ->Med Rx.  . Childhood asthma   . Chronic diastolic CHF (congestive heart failure) ([place]    a. 12/2015 Echo: EF 50-55%, mod AI, mod Ao root dil, mild MR, mod dil LA, mod RA.  . CKD (chronic kidney disease), stage III   . Colon cancer ([place]   . COPD (chronic obstructive pulmonary disease) ([facility]    pt denies at preop  . Diabetes mellitus type II    diet controlled  . History of syncope   . Hyperlipidemia   . Hypertensive heart disease   . Kidney lump 04/04/2010   Overview:  Renal Cell Carcinoma   . 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 Moderate aortic insufficiency    a. 12/2015 Echo: Mod AI.  .Marland KitchenParoxysmal atrial fibrillation ([place]    a. 12/2015 started on Xarelto (CHA2DS2VASc = 4-5).  . Pneumonia   . Prostate cancer ([place]   . Renal cell carcinoma ([place]   . Sleep apnea    Does not like  CPAP  . Spinal stenosis in cervical region 10/12/2016  . Venous insufficiency     MEDICATIONS:  Prior to Admission medications   Medication Sig Start Date End Date Taking? Authorizing Provider  amLODipine (NORVASC) 10 MG tablet Take 1 tablet (  10 mg total) by mouth daily. 05/17/19   Fulp, Cammie, MD  atorvastatin (LIPITOR) 80 MG tablet Take 1 tablet (80 mg total) by mouth daily. 05/17/19   Fulp, Cammie, MD  cetirizine (ZYRTEC) 10 MG tablet Take 10 mg by mouth daily.    [provider]  cholecalciferol (VITAMIN D3) 25 MCG (1000 UT) tablet Take 1,000 Units by mouth daily.    [provider]  ferrous sulfate (FERROUSUL) 325 (65 FE) MG tablet Take 1 tablet (325 mg total) by mouth 2 (two) times daily with a meal. Patient not taking: Reported on 06/03/2019 10/06/18   Scot Jun, FNP  fluticasone (FLONASE) 50 MCG/ACT nasal spray Place 2 sprays into both nostrils daily. 08/18/18   [provider]  glucose blood test strip Check blood sugar twice per day. E.11.9 10/06/18   Scot Jun, FNP  HYDROcodone-acetaminophen (NORCO/VICODIN) 5-325 MG tablet Take 0.5-1 tablets by mouth every 6 (six) hours as needed for up to 5 days for severe pain (That is not improved by your scheduled acetaminophen regimen). Please do not exceed 4000 mg of acetaminophen (Tylenol) a 24-hour period. Please note that he may be prescribed additional medicine that contains acetaminophen. 06/03/19 06/08/19  Fatima Blank, MD    ALLERGIES:  Allergies  Allergen Reactions  . Amoxicillin Other (See Comments)    Tolerates Unasyn. Can't move Has patient had a PCN reaction causing immediate rash, facial/tongue/throat swelling, SOB or lightheadedness with hypotension: No Has patient had a PCN reaction causing severe rash involving mucus membranes or skin necrosis: No Has patient had a PCN reaction that required hospitalization No Has patient had a PCN reaction occurring within the last 10 years: No If all of the above answers are "NO", then may proceed with Cephalosporin use.   [name] Penicillins Other (See Comments)    Tolerates Unasyn. Can't move/dizziness Has patient had a PCN reaction causing immediate rash, facial/tongue/throat swelling, SOB or lightheadedness with hypotension: No Has patient had a PCN reaction causing severe rash involving mucus membranes or skin necrosis: No Has patient had a PCN reaction that required hospitalization No Has patient had a PCN reaction occurring within the last 10 years: No If all of the above answers are "NO", then may proceed with Cephalosporin use.  Other reaction(s): O    SOCIAL HISTORY:  Social History   Tobacco Use  . Smoking status: Former Smoker    Types: Cigars    Quit date: 09/02/1977    Years since quitting: 41.7  . Smokeless tobacco: Never Used  . Tobacco comment:    Substance Use Topics  . Alcohol use: Not Currently    Comment: "stopped drinking  alcohol in ~ 1980; just drank a little on the weekends when I did drink"    FAMILY HISTORY: Family History  Problem Relation Age of Onset  . Emphysema Father   . Asthma Father   . Liver disease Father        tumor  . Heart disease Mother   . Hypertension Mother   . Asthma Sister   . Hypertension Son     EXAM: BP (!) 158/91   Pulse (!) 53   Temp 98.5 F (36.9 C) (Oral)   Resp 16   SpO2 99%  CONSTITUTIONAL: Alert and oriented and responds appropriately to questions. Well-appearing; well-nourished, elderly, no distress HEAD: Normocephalic EYES: Conjunctivae clear, pupils appear equal, EOMI ENT: normal nose; moist mucous membranes NECK: Supple, no meningismus, no nuchal rigidity, no LAD  CARD:  RRR; S1 and S2 appreciated; no murmurs, no clicks, no rubs, no gallops RESP: Normal chest excursion without splinting or tachypnea; breath sounds clear and equal bilaterally; no wheezes, no rhonchi, no rales, no hypoxia or respiratory distress, speaking full sentences ABD/GI: Normal bowel sounds; non-distended; soft, non-tender, no rebound, no guarding, no peritoneal signs, no hepatosplenomegaly, large ventral hernia that is reducible and nontender to palpation BACK:  The back appears normal and is non-tender to palpation, there is no CVA tenderness EXT: Normal ROM in all joints; non-tender to palpation; no edema; normal capillary refill; no cyanosis, no calf tenderness or swelling    SKIN: Normal color for age and race; warm; no rash NEURO: Moves all extremities equally PSYCH: The patient's mood and manner are appropriate. Grooming and personal hygiene are appropriate.  MEDICAL DECISION MAKING: Patient here requesting admission.  He was seen yesterday and found to have creatinine of 3.8.  Last creatinine was 1.69 in March 2020.  Unclear etiology for patient's acute renal failure but will start gentle IV hydration for possible prerenal causes.  It does not appear that he takes any nephrotoxic  medications.  Will repeat labs.  He also states he had hematuria tonight after self catheterizing.  This could be secondary to trauma but will check urinalysis to evaluate for infection.  Could also be secondary to his renal cell carcinoma.  ED PROGRESS: Patient's creatinine is now 4.  Normal electrolytes.  Hemoglobin is 9.0 but this is actually increased from 8.4 yesterday.  He has chronic anemia.  Will discuss with hospitalist for admission.  Urine culture pending.  No dysuria, fever, leukocytosis at this time to suggest UTI.   5:24 AM Discussed patient's case with hospitalist, Dr. Blaine Hamper.  I have recommended admission and patient (and family if present) agree with this plan. Admitting physician will place admission orders.   I reviewed all nursing notes, vitals, pertinent previous records, EKGs, lab and urine results, imaging (as available).  Patient's urinalysis does show nitrites, trace leukocyte esterase, greater than 50,000 red blood cells and few bacteria.  Urine culture is pending.  He is previously grown Enterobacter and Acinetobacter.  Will give dose of Cipro and Flagyl here.  He will be seen by morning hospitalist team.  He has no signs of sepsis today.  No fever, tachycardia, tachypnea, leukocytosis.    EKG Interpretation  Date/Time:  Friday June 04 2019 05:17:07 EDT Ventricular Rate:  54 PR Interval:    QRS Duration: 120 QT Interval:  513 QTC Calculation: 487 R Axis:   -51 Text Interpretation:  Sinus rhythm Atrial premature complex Left anterior fascicular block Nonspecific T abnormalities, inferior leads No significant change since last tracing Confirmed by Pryor Curia [phone]) on 06/04/2019 5:21:08 AM        Shaw A Tadesse was evaluated in Emergency Department on 06/04/2019 for the symptoms described in the history of present illness. He was evaluated in the context of the global COVID-19 pandemic, which necessitated consideration that the patient might be at risk for  infection with the SARS-CoV-2 virus that causes COVID-19. Institutional protocols and algorithms that pertain to the evaluation of patients at risk for COVID-19 are in a state of rapid change based on information released by regulatory bodies including the CDC and federal and state organizations. These policies and algorithms were followed during the patient's care in the ED.    Maleena Eddleman, Delice Bison, DO 06/04/19 [place], Delice Bison, DO 06/04/19 [phone]

## 2019-06-04 NOTE — ED Notes (Signed)
Dr Leonides Schanz made aware of pt creatinine level from the [facility].

## 2019-06-04 NOTE — ED Notes (Signed)
Patient given lunch tray and repositioned. Patient feeding self.

## 2019-06-04 NOTE — Progress Notes (Signed)
Patient was transferred to 5 E at 1406. Alert and oriented x 4. Room is set up. Call light is within patient's reach. No pain complained. Patient refused to have Foley inserted at this time and wanted RN to hold it due to he could pee now.

## 2019-06-04 NOTE — ED Notes (Signed)
Report given to [name], RN.

## 2019-06-05 LAB — CREATININE, URINE, RANDOM: Creatinine, Urine: 38.41 mg/dL

## 2019-06-05 LAB — CBC
HCT: 25.8 % — ABNORMAL LOW (ref 39.0–52.0)
Hemoglobin: 8.4 g/dL — ABNORMAL LOW (ref 13.0–17.0)
MCH: 32.9 pg (ref 26.0–34.0)
MCHC: 32.6 g/dL (ref 30.0–36.0)
MCV: 101.2 fL — ABNORMAL HIGH (ref 80.0–100.0)
Platelets: 193 10*3/uL (ref 150–400)
RBC: 2.55 MIL/uL — ABNORMAL LOW (ref 4.22–5.81)
RDW: 12.4 % (ref 11.5–15.5)
WBC: 4.4 10*3/uL (ref 4.0–10.5)
nRBC: 0 % (ref 0.0–0.2)

## 2019-06-05 LAB — BASIC METABOLIC PANEL
Anion gap: 9 (ref 5–15)
BUN: 51 mg/dL — ABNORMAL HIGH (ref 8–23)
CO2: 24 mmol/L (ref 22–32)
Calcium: 8.8 mg/dL — ABNORMAL LOW (ref 8.9–10.3)
Chloride: 108 mmol/L (ref 98–111)
Creatinine, Ser: 3.79 mg/dL — ABNORMAL HIGH (ref 0.61–1.24)
GFR calc Af Amer: 17 mL/min — ABNORMAL LOW (ref >60)
GFR calc non Af Amer: 15 mL/min — ABNORMAL LOW (ref >60)
Glucose, Bld: 79 mg/dL (ref 70–99)
Potassium: 3.1 mmol/L — ABNORMAL LOW (ref 3.5–5.1)
Sodium: 141 mmol/L (ref 135–145)

## 2019-06-05 LAB — URINE CULTURE: Culture: <10000 — AB

## 2019-06-05 LAB — SODIUM, URINE, RANDOM: Sodium, Ur: 80 mmol/L

## 2019-06-05 MED ORDER — POTASSIUM CHLORIDE CRYS ER 20 MEQ PO TBCR
40.0000 meq | EXTENDED_RELEASE_TABLET | Freq: Three times a day (TID) | ORAL | Status: AC
  Administered 2019-06-05 (×2): 40 meq via ORAL
  Filled 2019-06-05 (×2): qty 2

## 2019-06-05 NOTE — Progress Notes (Signed)
Urology Inpatient Progress Report  Gross hematuria [R31.0] AKI (acute kidney injury) ([place]) [N17.9] Acute renal failure superimposed on chronic kidney disease, unspecified CKD stage, unspecified acute renal failure type ([place]) [N17.9, N18.9]        Intv/Subj: No acute events overnight. Patient is without complaint. Hemoglobin relatively stable.  Creatinine slightly improved but he has great urine output.  Hematuria appears to be improving.  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 0.9 %  sodium chloride infusion   Intravenous Continuous Samuella Cota, MD 125 mL/hr at 06/05/19 0547    . acetaminophen (TYLENOL) tablet 650 mg  650 mg Oral Q6H PRN Samuella Cota, MD       Or  . acetaminophen (TYLENOL) suppository 650 mg  650 mg Rectal Q6H PRN Samuella Cota, MD      . amLODipine (NORVASC) tablet 10 mg  10 mg Oral Daily Samuella Cota, MD   10 mg at 06/05/19 1017  . finasteride (PROSCAR) tablet 5 mg  5 mg Oral Daily Marton Redwood III, MD   5 mg at 06/05/19 1017  . potassium chloride SA (KLOR-CON) CR tablet 40 mEq  40 mEq Oral TID Roney Jaffe, MD   40 mEq at 06/05/19 1017  . sodium chloride flush (NS) 0.9 % injection 3 mL  3 mL Intravenous Q12H Samuella Cota, MD   3 mL at 06/04/19 1005     Objective: Vital: Vitals:   06/05/19 0337 06/05/19 0500 06/05/19 0502 06/05/19 0933  BP: (!) 147/90  130/85 (!) 151/88  Pulse: (!) 54  (!) 54 (!) 56  Resp:   16 13  Temp:   98.1 F (36.7 C) 97.6 F (36.4 C)  TempSrc:   Oral   SpO2:   99% 99%  Weight:  104.1 kg    Height:       I/Os: I/O last 3 completed shifts: In: 2706.1 [P.O.:240; I.V.:2466.1] Out: 3075 [Urine:3075]  Physical Exam:  General: Patient is  in no apparent distress Lungs: Normal respiratory effort, chest expands symmetrically. GI: The abdomen is soft and nontender without mass. Urine in the urinal is light red and can see through Ext: lower extremities symmetric  Lab Results: Recent Labs    06/04/19 1131 06/04/19 1603 06/05/19 0608  WBC 5.1 4.3 4.4  HGB 8.0* 10.0* 8.4*  HCT 24.6* 30.9* 25.8*   Recent Labs    06/03/19 0419 06/04/19 0445 06/04/19 0456 06/05/19 0608  NA 138 135 137 141  K 3.3* 3.5 3.3* 3.1*  CL 103 100 99 108  CO2 24 23  --  24  GLUCOSE 97 91 88 79  BUN 53* 52* 48* 51*  CREATININE 3.83* 3.97* 4.00* 3.79*  CALCIUM 9.4 9.2  --  8.8*   Recent Labs    06/04/19 0445  INR 0.8   No results for input(s): LABURIN in the last 72 hours. Results for orders placed or performed during the hospital encounter of 06/04/19  Urine culture     Status: Abnormal   Collection Time: 06/04/19  4:49 AM   Specimen: Urine, Clean Catch  Result Value Ref Range Status   Specimen Description   Final    URINE, CLEAN CATCH Performed at [facility],  [place] [address] [place], [place] [address]    Special Requests   Final    NONE Performed at [facility], [place] [address], [place], [place] [address]    Culture (A)  Final    <10,000 COLONIES/mL INSIGNIFICANT GROWTH Performed at [facility] [address], [place], [place] [address]    Report Status 06/05/2019 FINAL  Final  SARS CORONAVIRUS 2 (TAT 6-24 HRS)     Status: None   Collection Time: 06/04/19  5:05 AM  Result Value Ref Range Status   SARS Coronavirus 2 NEGATIVE NEGATIVE Final    Comment: (NOTE) SARS-CoV-2 target nucleic acids are NOT DETECTED. The SARS-CoV-2 RNA is generally detectable in upper and lower respiratory specimens during the acute phase of infection. Negative results do not preclude SARS-CoV-2 infection, do not rule out co-infections with other pathogens, and should not be used as the sole basis for  treatment or other patient management decisions. Negative results must be combined with clinical observations, patient history, and epidemiological information. The expected result is Negative. Fact Sheet for Patients: SugarRoll.be Fact Sheet for Healthcare Providers: https://www.woods-mathews.com/ This test is not yet approved or cleared by the [place] FDA and  has been authorized for detection and/or diagnosis of SARS-CoV-2 by FDA under an Emergency Use Authorization (EUA). This EUA will remain  in effect (meaning this test can be used) for the duration of the COVID-19 declaration under Section 56 4(b)(1) of the Act, 21 U.S.C. section 360bbb-3(b)(1), unless the authorization is terminated or revoked sooner. Performed at [facility], [place] [address] [place], [place] [address]     Studies/Results: US Renal  Result Date: 06/04/2019 CLINICAL DATA:  Acute kidney injury. EXAM: RENAL / URINARY TRACT ULTRASOUND COMPLETE COMPARISON:  Abdominopelvic CT 06/01/2019. Renal ultrasound 06/06/2018. PET-CT 08/19/2018. FINDINGS: Right Kidney: Renal measurements: 13.0 x 5.6 x 4.9 cm = volume: 185.1 mL. There is a simple appearing cystic lesion in the upper interpolar region measuring 3.0 x 2.6 x 2.7 cm. In the lower pole, there is a mildly septated cystic lesion measuring 4.2 x 3.3 x 3.7 cm. Numerous other smaller cystic lesions are present. No hydronephrosis. Left Kidney: Renal measurements: 10.5 x 6.2 x 4.4 cm = volume: 151 mL. The known solid mass in the upper pole which was hypermetabolic on previous PET-CT shows continued enlargement as seen on earlier CT. This now measures 7.3 x 6.0 x 8.2 cm (4.9 x 4.5 x 4.5 cm on previous ultrasound of 1 year ago). Color Doppler analysis of this lesion is suboptimal. There are additional cystic lesions in the interpolar region, measuring up to 4.2 and 3.5 cm respectively. No hydronephrosis. Bladder: Median lobe  prostate hypertrophy protrudes into the bladder lumen as seen on previous studies. There is a separate filling defect which was incompletely evaluated by this examination, probably reflecting blood clot. Patient was unable to complete the examination, needing to void. IMPRESSION: 1. Known solid mass involving the upper pole of the left kidney shows continued enlargement as seen on earlier CT scan. This remains consistent with neoplasm, likely renal cell carcinoma. Please refer to recommendations on CT scan performed 06/01/2019. 2. Additional simple and complex cystic lesions bilaterally. No hydronephrosis. 3. Filling defect in the urinary bladder lumen adjacent to an enlarged median lobe, probably blood clot. This was incompletely evaluated by this examination. Electronically Signed   By: Richardean Sale M.D.   On: 06/04/2019 11:38    Assessment: Gross hematuria Left renal neoplasm Acute renal  insufficiency  Plan: Continue to monitor.  Continue finasteride.  He will need outpatient follow-up for a cystoscopy and discussion of management of the left renal mass   [name], MD Urology 06/05/2019, 10:47 AM

## 2019-06-05 NOTE — Progress Notes (Signed)
Have spoken with urologist Dr. Gloriann Loan. Stated we could irrigate Foley every 4 hours as needed for retention.  Irrigated Foley at this time with 83ml. Blood clot return and then clearing. Pt stating the pain is gone and the catheter is feeling "normal". Will continue to monitor closely.

## 2019-06-05 NOTE — Plan of Care (Signed)

## 2019-06-05 NOTE — Progress Notes (Signed)
[facility] Progress Note  Subjective: foley placed today and per RN pt feeling much better, they will irrigate prn every 4 hrs, UOP better w/ cath in.   Vitals:   06/05/19 0500 06/05/19 0502 06/05/19 0933 06/05/19 1323  BP:  130/85 (!) 151/88 (!) 142/78  Pulse:  (!) 54 (!) 56 (!) 50  Resp:  _0 Temp:  98.1 F (36.7 C) 97.6 F (36.4 C) 98.6 F (37 C)  TempSrc:  Oral  Oral  SpO2:  99% 99% 99%  Weight: 104.1 kg     Height:        Inpatient medications: . amLODipine  10 mg Oral Daily  . finasteride  5 mg Oral Daily  . sodium chloride flush  3 mL Intravenous Q12H   . sodium chloride 125 mL/hr at 06/05/19 1500   acetaminophen **OR** acetaminophen    Exam: Gen alert, calm and no distress No jvd Chest clear bilat RRR no MRG Abd soft ntnd no mass or ascites +bs  GU foley draining bloody urine good amts Ext trace pretib edema Neuro is alert, Ox 3 , nf    Home meds:  - amlodipine 10  - atorvastatin 80 qd   - hydrocodone - aceta qid prn  - prn's/ vitamins/ supplements    UA 10/2 > turbid , red, large Hb, >50 rbc, 6.55, >300 prot, 0-5 wbc/ epi  Renal US > 10- 13cm kidneys, no hydro, +renal cysts and known renal mass as before  Na 135 K 3.5  CO2 23  BUN 48  Cr 4.00   Ca 9.2  Alb 4.3  ST 39/ALT 40 Tbili 0.7  Tprot 7.7  eGFR 14  Hb 10  WBC 4K  plt177  Baseline Creat Jan - March 2020 = 1.67- 2.38   eGFR 30- 50 UNa 80 , UCr 38  Assessment/ Plan: 1. AKI on CKD3 - possibly related to bladder clot/ urinary retention. AKI pk creat 4.0 > 3.7 today, UOP improving w/ foley in place, sig blood in urine, irrigating prn. Baseline creat 1.6- 2.3.  No vol excess, agree w/ IVF"s and f/u labs. No hydro on CT or [place].  BP's normal. Not uremic. Avoid nsaid's/ ACEi's / ARB's and contrast.  May be improving.  2. Hematuria - trauma from attempted self-cath, hx BPH.  3. Renal mass - urology following 4. HTN - on amlodipine 5. COPD 6. Multiple myeloma - no globulin  gap here     Rob Rodrigus Kilker 06/05/2019, 6:17 PM  Iron/TIBC/Ferritin/ %Sat    Component Value Date/Time   IRON 56 10/06/2018 1541   TIBC 264 10/06/2018 1541   FERRITIN 321 10/06/2018 1541   IRONPCTSAT 21 10/06/2018 1541   Recent Labs  Lab 06/03/19 0419 06/04/19 0445  06/05/19 0608  NA 138 135   < > 141  K 3.3* 3.5   < > 3.1*  CL 103 100   < > 108  CO2 24 23  --  24  GLUCOSE 97 91   < > 79  BUN 53* 52*   < > 51*  CREATININE 3.83* 3.97*   < > 3.79*  CALCIUM 9.4 9.2  --  8.8*  ALBUMIN 4.3  --   --   --   INR  --  0.8  --   --    < > = values in this interval not displayed.   Recent Labs  Lab 06/03/19 0419  AST 39  ALT 48*  ALKPHOS 172*  BILITOT 0.7  PROT 7.7   Recent Labs  Lab 06/05/19 0608  WBC 4.4  HGB 8.4*  HCT 25.8*  PLT 193

## 2019-06-05 NOTE — Progress Notes (Signed)
Have inserted 16Fr urethral catheter at this time. Have alerted MD via text to return of blood. Also, noticing slight blood around the insertion site at tip of penis. Pt without c/o pain. Having him drink water. Pt voided a small amount right before insertion, as well as having a BM (which pt states was "normal"). Will continue to monitor.

## 2019-06-05 NOTE — Progress Notes (Signed)
PROGRESS NOTE    [name] [name]  DUK:[id number] DOB: Nov 27, 1945 DOA: 06/04/2019 PCP: Scot Jun, FNP  Brief Narrative: 73 y.o. year-old with hx of CKD3, BPH, urinary retention, multiple myeloma, COPD and renal cell carcinoma, who has refused to accept that he has cancer and refused follow-up and treatment for multiple myeloma, evaluation for renal cell carcinoma, was seen in ED for gout pain on 10/1 and was found to  have ^'d creatinine so was going to be admitted but pt refused.  He came back to the emergency room 10/2 evening he was attempting to self-cath and developed acute hematuria and came to hospital and was admitted.  Creat was 4.0.  Prior to that creat at baseline is around 1.6- 2.5.  -Of note patient stopped follow-up at the cancer center, stopped follow-up with urology -Palliative care discussions in the past have only angered and irritated the patient and discussions were unsuccessful unsuccessful, attempts at contacting family were also unsuccessful  Assessment & Plan:   AKI superimposed on CKD stage III with gross hematuria.   -Baseline creatinine ranging from 1.6-2 range in 11/2018  -Admitted with creatinine of 4, likely secondary to postobstructive AKI  -Patient declined Foley catheter placement last night, requested and had lengthy discussion with patient about this, finally agrees to have Foley catheter placed today  -CT abdomen pelvis last week noted diffuse bladder wall thickening from bladder outlet obstruction by markedly enlarged prostate  -Appreciate nephrology and urology consult  -Continue IV fluids, monitor urine output, kidney function   Hematuria - could be secondary to massively distended bladder with chronic prostatic enlargement, worsened by suspected traumatic catheterization -Started on finasteride  Acute gout left great toe.  Imaging 10/1 consistent with gout. --Denies active pain at this time, add allopurinol  Light chain myeloma.  -Lost to  follow-up, will notify oncology of admission, doubt this will be helpful, will call son as well  Left renal cell carcinoma diagnosed February 2020.  CT abdomen pelvis 9/29 showed significant interval growth left renal cortical mass which was hypermetabolic on PET scan December 2019, findings compatible with large growing renal cell carcinoma.  Numerous additional complex bilateral renal masses were noted and could be additional renal cell carcinomas.  MRI with and without contrast recommended recommended. -Patient has declined to follow-up with urology in the past -Do not anticipate he will follow through with any treatment plan -Will discuss with son  Diabetes mellitus type 2 --Patient denies this diagnosis and takes no medications for this.  His hemoglobin A1c was 5.4 in January 2020. --CBGs controlled, somewhat on the lower side, monitor  COPD -Per chart review -Asymptomatic, monitor  Paroxysmal atrial fibrillation -Currently in sinus rhythm, not on rate control agents -Has declined to take anticoagulation in the past and be highly noncompliant patient based on past behavior, also current hematuria prohibits this  Ventral abdominal hernia --Asymptomatic.  No signs of obstruction.  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  --Recommend follow-up aortic ultrasound in 5 years.  Aortic atherosclerosis  DVT prophylaxis: SCDs Code Status: Full code Family Communication: No family at bedside Disposition Plan: To be determined  Consultants:   Urology Renal   Procedures:   Antimicrobials:    Subjective: -Had declined Foley catheter placement in the ER last night, self catheterized himself this morning, small amounts of bloody urine retrieved -Had extensive discussion regarding this with patient this morning, finally really agrees to Foley catheter placement  Objective: Vitals:   06/05/19  4765 06/05/19 0500 06/05/19 0502 06/05/19  0933  BP: (!) 147/90  130/85 (!) 151/88  Pulse: (!) 54  (!) 54 (!) 56  Resp:   16 13  Temp:   98.1 F (36.7 C) 97.6 F (36.4 C)  TempSrc:   Oral   SpO2:   99% 99%  Weight:  104.1 kg    Height:        Intake/Output Summary (Last 24 hours) at 06/05/2019 1258 Last data filed at 06/05/2019 1100 Gross per 24 hour  Intake 2726.09 ml  Output 2125 ml  Net 601.09 ml   Filed Weights   06/04/19 1408 06/05/19 0500  Weight: 97.5 kg 104.1 kg    Examination:  General exam: Alert awake oriented x2, no distress Respiratory system: Poor air movement, otherwise clear Cardiovascular system: S1 & S2 heard, RRR Gastrointestinal system: Soft, obese, nondistended, massive ventral hernia, bowel sounds present Central nervous system: Alert and oriented. No focal neurological deficits. Extremities: 1+ edema Skin: No rashes, lesions or ulcers Psychiatry: Poor insight and judgment   Data Reviewed:   CBC: Recent Labs  Lab 06/03/19 0419 06/04/19 0445 06/04/19 0456 06/04/19 1131 06/04/19 1603 06/05/19 0608  WBC 6.4 6.0  --  5.1 4.3 4.4  NEUTROABS 4.2 3.9  --   --   --   --   HGB 8.4* 9.0* 9.5* 8.0* 10.0* 8.4*  HCT 26.6* 28.0* 28.0* 24.6* 30.9* 25.8*  MCV 100.8* 101.4*  --  100.8* 101.6* 101.2*  PLT 216 218  --  189 177 465   Basic Metabolic Panel: Recent Labs  Lab 06/03/19 0419 06/04/19 0445 06/04/19 0456 06/05/19 0608  NA 138 135 137 141  K 3.3* 3.5 3.3* 3.1*  CL 103 100 99 108  CO2 24 23  --  24  GLUCOSE 97 91 88 79  BUN 53* 52* 48* 51*  CREATININE 3.83* 3.97* 4.00* 3.79*  CALCIUM 9.4 9.2  --  8.8*   GFR: Estimated Creatinine Clearance: 19.4 mL/min (A) (by C-G formula based on SCr of 3.79 mg/dL (H)). Liver Function Tests: Recent Labs  Lab 06/03/19 0419  AST 39  ALT 48*  ALKPHOS 172*  BILITOT 0.7  PROT 7.7  ALBUMIN 4.3   No results for input(s): LIPASE, AMYLASE in the last 168 hours. No results for input(s): AMMONIA in the last 168 hours. Coagulation  Profile: Recent Labs  Lab 06/04/19 0445  INR 0.8   Cardiac Enzymes: No results for input(s): CKTOTAL, CKMB, CKMBINDEX, TROPONINI in the last 168 hours. BNP (last 3 results) No results for input(s): PROBNP in the last 8760 hours. HbA1C: No results for input(s): HGBA1C in the last 72 hours. CBG: No results for input(s): GLUCAP in the last 168 hours. Lipid Profile: No results for input(s): CHOL, HDL, LDLCALC, TRIG, CHOLHDL, LDLDIRECT in the last 72 hours. Thyroid Function Tests: No results for input(s): TSH, T4TOTAL, FREET4, T3FREE, THYROIDAB in the last 72 hours. Anemia Panel: No results for input(s): VITAMINB12, FOLATE, FERRITIN, TIBC, IRON, RETICCTPCT in the last 72 hours. Urine analysis:    Component Value Date/Time   COLORURINE RED (A) 06/04/2019 0449   APPEARANCEUR TURBID (A) 06/04/2019 0449   LABSPEC 1.015 06/04/2019 0449   PHURINE 6.5 06/04/2019 0449   GLUCOSEU NEGATIVE 06/04/2019 0449   HGBUR LARGE (A) 06/04/2019 0449   BILIRUBINUR NEGATIVE 06/04/2019 0449   KETONESUR NEGATIVE 06/04/2019 0449   PROTEINUR >300 (A) 06/04/2019 0449   NITRITE POSITIVE (A) 06/04/2019 0449   LEUKOCYTESUR TRACE (A) 06/04/2019 0449  Sepsis Labs: @LABRCNTIP (procalcitonin:4,lacticidven:4)  ) Recent Results (from the past 240 hour(s))  Urine culture     Status: Abnormal   Collection Time: 06/04/19  4:49 AM   Specimen: Urine, Clean Catch  Result Value Ref Range Status   Specimen Description   Final    URINE, CLEAN CATCH Performed at [facility], [place] [address] [place], [place] [address]    Special Requests   Final    NONE Performed at [facility], [place] [address], [place], [place] [address]    Culture (A)  Final    <10,000 COLONIES/mL INSIGNIFICANT GROWTH Performed at [place] [address], [place], [place] [address]    Report Status 06/05/2019 FINAL  Final  SARS CORONAVIRUS 2 (TAT 6-24 HRS)     Status: None   Collection Time:  06/04/19  5:05 AM  Result Value Ref Range Status   SARS Coronavirus 2 NEGATIVE NEGATIVE Final    Comment: (NOTE) SARS-CoV-2 target nucleic acids are NOT DETECTED. The SARS-CoV-2 RNA is generally detectable in upper and lower respiratory specimens during the acute phase of infection. Negative results do not preclude SARS-CoV-2 infection, do not rule out co-infections with other pathogens, and should not be used as the sole basis for treatment or other patient management decisions. Negative results must be combined with clinical observations, patient history, and epidemiological information. The expected result is Negative. Fact Sheet for Patients: SugarRoll.be Fact Sheet for Healthcare Providers: https://www.woods-mathews.com/ This test is not yet approved or cleared by the [place] FDA and  has been authorized for detection and/or diagnosis of SARS-CoV-2 by FDA under an Emergency Use Authorization (EUA). This EUA will remain  in effect (meaning this test can be used) for the duration of the COVID-19 declaration under Section 56 4(b)(1) of the Act, 21 U.S.C. section 360bbb-3(b)(1), unless the authorization is terminated or revoked sooner. Performed at [facility], [place] [address] [place], [place] [address]          Radiology Studies: US Renal  Result Date: 06/04/2019 CLINICAL DATA:  Acute kidney injury. EXAM: RENAL / URINARY TRACT ULTRASOUND COMPLETE COMPARISON:  Abdominopelvic CT 06/01/2019. Renal ultrasound 06/06/2018. PET-CT 08/19/2018. FINDINGS: Right Kidney: Renal measurements: 13.0 x 5.6 x 4.9 cm = volume: 185.1 mL. There is a simple appearing cystic lesion in the upper interpolar region measuring 3.0 x 2.6 x 2.7 cm. In the lower pole, there is a mildly septated cystic lesion measuring 4.2 x 3.3 x 3.7 cm. Numerous other smaller cystic lesions are present. No hydronephrosis. Left Kidney: Renal measurements: 10.5 x 6.2 x 4.4  cm = volume: 151 mL. The known solid mass in the upper pole which was hypermetabolic on previous PET-CT shows continued enlargement as seen on earlier CT. This now measures 7.3 x 6.0 x 8.2 cm (4.9 x 4.5 x 4.5 cm on previous ultrasound of 1 year ago). Color Doppler analysis of this lesion is suboptimal. There are additional cystic lesions in the interpolar region, measuring up to 4.2 and 3.5 cm respectively. No hydronephrosis. Bladder: Median lobe prostate hypertrophy protrudes into the bladder lumen as seen on previous studies. There is a separate filling defect which was incompletely evaluated by this examination, probably reflecting blood clot. Patient was unable to complete the examination, needing to void. IMPRESSION: 1. Known solid mass involving the upper pole of the left kidney shows continued enlargement as seen on earlier CT scan. This remains consistent with neoplasm, likely renal cell carcinoma. Please refer  to recommendations on CT scan performed 06/01/2019. 2. Additional simple and complex cystic lesions bilaterally. No hydronephrosis. 3. Filling defect in the urinary bladder lumen adjacent to an enlarged median lobe, probably blood clot. This was incompletely evaluated by this examination. Electronically Signed   By: Richardean Sale M.D.   On: 06/04/2019 11:38        Scheduled Meds: . amLODipine  10 mg Oral Daily  . finasteride  5 mg Oral Daily  . potassium chloride  40 mEq Oral TID  . sodium chloride flush  3 mL Intravenous Q12H   Continuous Infusions: . sodium chloride 125 mL/hr at 06/05/19 0547     LOS: 1 day    Time spent: 95mn  PDomenic Polite MD [facility]  06/05/2019, 12:58 PM

## 2019-06-06 DIAGNOSIS — Z515 Encounter for palliative care: Secondary | ICD-10-CM

## 2019-06-06 DIAGNOSIS — Z7189 Other specified counseling: Secondary | ICD-10-CM

## 2019-06-06 LAB — BASIC METABOLIC PANEL
Anion gap: 7 (ref 5–15)
BUN: 44 mg/dL — ABNORMAL HIGH (ref 8–23)
CO2: 23 mmol/L (ref 22–32)
Calcium: 8.5 mg/dL — ABNORMAL LOW (ref 8.9–10.3)
Chloride: 110 mmol/L (ref 98–111)
Creatinine, Ser: 3.53 mg/dL — ABNORMAL HIGH (ref 0.61–1.24)
GFR calc Af Amer: 18 mL/min — ABNORMAL LOW (ref >60)
GFR calc non Af Amer: 16 mL/min — ABNORMAL LOW (ref >60)
Glucose, Bld: 84 mg/dL (ref 70–99)
Potassium: 3.3 mmol/L — ABNORMAL LOW (ref 3.5–5.1)
Sodium: 140 mmol/L (ref 135–145)

## 2019-06-06 LAB — HEMOGLOBIN AND HEMATOCRIT, BLOOD
HCT: 25.1 % — ABNORMAL LOW (ref 39.0–52.0)
Hemoglobin: 7.9 g/dL — ABNORMAL LOW (ref 13.0–17.0)

## 2019-06-06 MED ORDER — SODIUM CHLORIDE 0.9 % IV SOLN
INTRAVENOUS | Status: AC
  Administered 2019-06-06: 11:00:00 via INTRAVENOUS

## 2019-06-06 MED ORDER — POTASSIUM CHLORIDE CRYS ER 20 MEQ PO TBCR
40.0000 meq | EXTENDED_RELEASE_TABLET | Freq: Once | ORAL | Status: AC
  Administered 2019-06-06: 40 meq via ORAL
  Filled 2019-06-06: qty 2

## 2019-06-06 MED ORDER — CHLORHEXIDINE GLUCONATE CLOTH 2 % EX PADS
6.0000 | MEDICATED_PAD | Freq: Every day | CUTANEOUS | Status: DC
  Administered 2019-06-06 – 2019-06-09 (×4): 6 via TOPICAL

## 2019-06-06 NOTE — Consult Note (Signed)
                                                                                 Consultation Note Date: 06/06/2019   Patient Name: [name] [name]  DOB: 10/27/1945  MRN: [id number]  Age / Sex: 73 y.o., male  PCP: Harris, Kimberly S, FNP Referring Physician: Joseph, Preetha, MD  Reason for Consultation: Establishing goals of care  HPI/Patient Profile: 73 y.o. male  with past medical history of   admitted on 06/04/2019   73 y.o.year-old with hx of CKD3, BPH, urinary retention, multiple myeloma, COPD and renal cell carcinoma, who has refused to accept that he has cancer and refused follow-up and treatment for multiple myeloma, evaluation for renal cell carcinoma, was seen in ED for gout pain on 10/1 and was found to have ^'d creatinine so was going to be admitted but pt refused.  He came back to the emergency room 10/2 evening he was attempting to self-cath and developed acute hematuria and came to hospital and was admitted. Creat was 4.0. Prior to that creat at baseline is around 1.6.  -Of note patient stopped follow-up at the cancer center, stopped follow-up with urology.  Clinical Assessment and Goals of Care:  Patient remains admitted to hospital medicine service, additionally, he is being followed by urology and nephrology.   A palliative consult has been requested for additional and ongoing goals of care discussions.   The patient is resting in bed, he is sleeping but awakens easily and engages, how ever, he is tangential at times, perseverates quite often.   Palliative medicine is specialized medical care for people living with serious illness. It focuses on providing relief from the symptoms and stress of a serious illness. The goal is to improve quality of life for both the patient and the family.  Goals of care: Broad aims of medical therapy in relation to the patient's values and preferences. Our aim is to provide medical care aimed at enabling patients to achieve the goals that  matter most to them, given the circumstances of their particular medical situation and their constraints.   [name] [name] states that his hematuria has much improved and that his abdominal distension is significantly better. He describes the events leading upto this hospitalization. He lives alone, wife died 5 years ago, has 6 kids, states [place] and that he is his designated HCPOA agent, although he hasn't completed any advanced directives.   The patient believes that all of his problems are due to an enlarged prostate. He does state that he has a "mass on his kidney", but isn't sure if he would want to go through with nephrectomy.   Goals, wishes and values attempted to be explored. Life review performed.   Discussed gently but frankly about his serious illnesses such as multiple myeloma, L renal cell carcinoma, underlying III CKD.   The patient states that chemotherapy for his myeloma "almost killed him". He doesn't want to follow up with oncology.   We then talked about code status. The patient becomes a little upset, " negative things like that bug me. You're all doom   and gloom. Sickness belongs to the devil. The Reita Cliche will fix it for me."   Tried to have a discussion about the full scope of a resuscitative attempt, CPR, intubation, mechanical ventilation, use of ACLS medications etc. The patient states that he would want to remain full code, full scope. "I just started living."  See below.   NEXT OF KIN  son   SUMMARY OF RECOMMENDATIONS    full code, full scope for now Recommend outpatient palliative, if the patient goes home with son.  Chaplain consult for additional support.   Code Status/Advance Care Planning:  Full code    Symptom Management:    as above   Palliative Prophylaxis:   Bowel Regimen  Additional Recommendations (Limitations, Scope, Preferences):  Full Scope Treatment  Psycho-social/Spiritual:    Desire for further Chaplaincy support:yes  Additional Recommendations: Caregiving  Support/Resources  Prognosis:   Unable to determine  Discharge Planning: To Be Determined      Primary Diagnoses: Present on Admission: . AKI (acute kidney injury) ([place])   I have reviewed the medical record, interviewed the patient and family, and examined the patient. The following aspects are pertinent.  Past Medical History:  Diagnosis Date  . Abnormal CT of liver    a. nodular contour suggesting cirrhosis 05/2018.  [name] Abnormal LFTs (liver function tests)   . Anxiety   . Aortic root dilatation ([place])   . Ascending aortic aneurysm ([place])   . BPH with urinary obstruction   . Bradycardia   . 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 CAD (coronary artery disease)    a. 12/2015 NSTEMI/Cath (in setting of PAF):  LM nl, LAD 30p,  LCX 56m RCA  ok, AM 100%, RPDA1 40, RPDA 2 60 ->Med Rx.  . Childhood asthma   . Chronic diastolic CHF (congestive heart failure) ([place]    a. 12/2015 Echo: EF 50-55%, mod AI, mod Ao root dil, mild [name], mod dil LA, mod RA.  . CKD (chronic kidney disease), stage III   . Colon cancer ([place]   . COPD (chronic obstructive pulmonary disease) ([place]    pt denies at preop  . Diabetes mellitus type II    diet controlled  . History of syncope   . Hyperlipidemia   . Hypertensive heart disease   . Kidney lump 04/04/2010   Overview:  Renal Cell Carcinoma   . Light chain myeloma ([place]   . Moderate aortic insufficiency    a. 12/2015 Echo: Mod AI.  .Marland KitchenParoxysmal atrial fibrillation ([facility]    a. 12/2015 started on Xarelto (CHA2DS2VASc = 4-5).  . Pneumonia   . Prostate cancer ([facility]   . Renal cell carcinoma ([place]   . Sleep apnea    Does not like  CPAP  . Spinal stenosis in cervical region 10/12/2016  . Venous insufficiency    Social History   Socioeconomic History  . Marital status: Widowed    Spouse  name: Not on file  . Number of children: Not on file  . Years of education: Not on file  . Highest education level: Not on file  Occupational History  . Not on file  Social Needs  . Financial resource strain: Not on file  . Food insecurity    Worry: Not on file    Inability: Not on file  . Transportation needs    Medical: Not on file    Non-medical: Not on file  Tobacco Use  . Smoking status: Former Smoker    Types: Cigars    Quit date: 09/02/1977    Years since quitting: 41.7  . Smokeless tobacco: Never Used  . Tobacco comment:    Substance and Sexual Activity  . Alcohol use: Not Currently    Comment: "stopped drinking alcohol in ~ 1980; just drank a little on the weekends when I did drink"  . Drug use: No  . Sexual activity: Not Currently  Lifestyle  . Physical activity    Days per week: Not on file    Minutes per session: Not on file  . Stress: Not on file  Relationships  . Social Herbalist on phone: Not on file    Gets together: Not on file    Attends religious service: Not on file    Active member of club or organization: Not on file    Attends meetings of clubs or organizations: Not on file    Relationship status: Not on file  Other Topics Concern  . Not on file  Social History Narrative   Lives alone.  Wife died in 04-22-23.  Lives in apartment.     Family History  Problem Relation Age of Onset  . Emphysema Father   . Asthma Father   . Liver disease Father        tumor  . Heart disease Mother   . Hypertension Mother   . Asthma Sister   . Hypertension Son    Scheduled Meds: . amLODipine  10 mg Oral Daily  . Chlorhexidine Gluconate Cloth  6 each Topical Daily  . finasteride  5 mg Oral Daily  . sodium chloride flush  3 mL Intravenous Q12H   Continuous Infusions: . sodium chloride 50 mL/hr at 06/06/19 1039   PRN Meds:.acetaminophen **OR** acetaminophen Medications Prior to Admission:  Prior to Admission medications   Medication Sig Start Date  End Date Taking? Authorizing Provider  amLODipine (NORVASC) 10 MG tablet Take 1 tablet (10 mg total) by mouth daily. 05/17/19  Yes Fulp, Cammie, MD  atorvastatin (LIPITOR) 80 MG tablet Take 1 tablet (80 mg total) by mouth daily. 05/17/19  Yes Fulp, Cammie, MD  cetirizine (ZYRTEC) 10 MG tablet Take 10 mg by mouth daily.   Yes [provider]  cholecalciferol (VITAMIN D3) 25 MCG (1000 UT) tablet Take 1,000 Units by mouth daily.   Yes [provider]  fluticasone (FLONASE) 50 MCG/ACT nasal spray Place 2 sprays into both nostrils daily. 08/18/18  Yes [provider]  ferrous sulfate (FERROUSUL) 325 (65 FE) MG tablet Take 1 tablet (325 mg total) by mouth 2 (two) times daily with a meal. Patient not taking: Reported on 06/03/2019 10/06/18   Scot Jun, FNP  glucose blood test strip Check blood sugar twice per day. E.11.9 10/06/18   Scot Jun, FNP  HYDROcodone-acetaminophen (NORCO/VICODIN) 5-325 MG tablet Take 0.5-1 tablets by mouth every 6 (six) hours as needed for up to 5 days for severe pain (That is not improved by your scheduled acetaminophen regimen). Please do not exceed 4000 mg of acetaminophen (Tylenol) a 24-hour period. Please note that he may be prescribed additional medicine that contains acetaminophen. 06/03/19 06/08/19  Fatima Blank, MD   Allergies  Allergen Reactions  . Amoxicillin Other (See Comments)    Tolerates Unasyn. Can't move Has patient had a PCN reaction causing immediate rash, facial/tongue/throat swelling, SOB or lightheadedness with hypotension: No Has patient had a PCN reaction causing  severe rash involving mucus membranes or skin necrosis: No Has patient had a PCN reaction that required hospitalization No Has patient had a PCN reaction occurring within the last 10 years: No If all of the above answers are "NO", then may proceed with Cephalosporin use.   . Penicillins Other (See Comments)    Tolerates Unasyn. Can't  move/dizziness Has patient had a PCN reaction causing immediate rash, facial/tongue/throat swelling, SOB or lightheadedness with hypotension: No Has patient had a PCN reaction causing severe rash involving mucus membranes or skin necrosis: No Has patient had a PCN reaction that required hospitalization No Has patient had a PCN reaction occurring within the last 10 years: No If all of the above answers are "NO", then may proceed with Cephalosporin use.  Other reaction(s): O   Review of Systems Denies pain  Physical Exam Awake alert Abdominal distension S1 S2 Clear Some edema Non focal but tangential  Vital Signs: BP (!) 146/101 (BP Location: Left Arm)   Pulse (!) 58   Temp (!) 97.5 F (36.4 C) (Oral)   Resp 20   Ht 5' 7.01" (1.702 m)   Wt 100.8 kg   SpO2 98%   BMI 34.80 kg/m  Pain Scale: 0-10   Pain Score: 0-No pain   SpO2: SpO2: 98 % O2 Device:SpO2: 98 % O2 Flow Rate: .   IO: Intake/output summary:   Intake/Output Summary (Last 24 hours) at 06/06/2019 1713 Last data filed at 06/06/2019 1609 Gross per 24 hour  Intake 2117.61 ml  Output 2600 ml  Net -482.39 ml    LBM: Last BM Date: 06/06/19 Baseline Weight: Weight: 97.5 kg Most recent weight: Weight: 100.8 kg     Palliative Assessment/Data:   PPS 40%  Time In:  1600 Time Out:  1700 Time Total:   60 Greater than 50%  of this time was spent counseling and coordinating care related to the above assessment and plan.  Signed by: Zeba Anwar, MD [id number]  Please contact Palliative Medicine Team phone at [phone] for questions and concerns.  For individual provider: See Amion             

## 2019-06-06 NOTE — Progress Notes (Signed)
[facility] Progress Note  Subjective: creat down minimally to 3.5.  Pt feeling better / good altogether, bloody urine is clearing up some  Vitals:   06/05/19 0933 06/05/19 1323 06/05/19 2007 06/06/19 0532  BP: (!) 151/88 (!) 142/78 (!) 145/85 (!) 154/99  Pulse: (!) 56 (!) 50 (!) 57 (!) 54  Resp: 13 16 20 20   Temp: 97.6 F (36.4 C) 98.6 F (37 C) (!) 97.5 F (36.4 C) 98 F (36.7 C)  TempSrc:  Oral Oral Oral  SpO2: 99% 99% 96% 97%  Weight:    100.8 kg  Height:        Inpatient medications: . amLODipine  10 mg Oral Daily  . Chlorhexidine Gluconate Cloth  6 each Topical Daily  . finasteride  5 mg Oral Daily  . sodium chloride flush  3 mL Intravenous Q12H   . sodium chloride 100 mL/hr at 06/06/19 0600   acetaminophen **OR** acetaminophen    Exam: Gen alert, calm and no distress No jvd Chest clear bilat RRR no MRG Abd soft ntnd no mass or ascites +bs  GU foley draining bloody urine good amts Ext trace pretib edema Neuro is alert, Ox 3 , nf    Home meds:  - amlodipine 10  - atorvastatin 80 qd   - hydrocodone - aceta qid prn  - prn's/ vitamins/ supplements    UA 10/2 > turbid , red, large Hb, >50 rbc, 6.55, >300 prot, 0-5 wbc/ epi  Renal US > 10- 13cm kidneys, no hydro, +renal cysts and known renal mass as before  Na 135 K 3.5  CO2 23  BUN 48  Cr 4.00   Ca 9.2  Alb 4.3  ST 39/ALT 40 Tbili 0.7  Tprot 7.7  eGFR 14  Hb 10  WBC 4K  plt177  Baseline Creat Jan - March 2020 = 1.67- 2.38   eGFR 30- 50 UNa 80 , UCr 38  Assessment/ Plan: 1. AKI on CKD3 - possibly related to bladder clot/ urinary retention. AKI pk creat 4.0 > 3.7 > 3.5 today.  Baseline creat 1.6- 2.3. No hydro on imaging and lack of sig improvement is concerning, could have myeloma kidney w/ hx of untreated myeloma. Eating well will dc IVF's. Get serum FLC ratio.  No indication for RRT, not uremic.  Poor compliance w/ ONC situation and denial of renal cancer, not sure patient would do  well on dialysis and that is something that could happen easily w/ untreated myeloma. Recommend palliative care consult for [place].   2. Hematuria - trauma from attempted self-cath, hx BPH, improving. Foley in place, urology following.  3. Renal mass - supsected RCC 4. HTN - on amlodipine 5. COPD  6. Multiple myeloma - diagnosed late 2019, took chemoRx for a short while and hasn't been back to ONC, lost to f/u.      [name] [name] 06/06/2019, 10:24 AM  Iron/TIBC/Ferritin/ %Sat    Component Value Date/Time   IRON 56 10/06/2018 1541   TIBC 264 10/06/2018 1541   FERRITIN 321 10/06/2018 1541   IRONPCTSAT 21 10/06/2018 1541   Recent Labs  Lab 06/03/19 0419 06/04/19 0445  06/06/19 0832  NA 138 135   < > 140  K 3.3* 3.5   < > 3.3*  CL 103 100   < > 110  CO2 24 23   < > 23  GLUCOSE 97 91   < > 84  BUN 53* 52*   < > 44*  CREATININE 3.83*  3.97*   < > 3.53*  CALCIUM 9.4 9.2   < > 8.5*  ALBUMIN 4.3  --   --   --   INR  --  0.8  --   --    < > = values in this interval not displayed.   Recent Labs  Lab 06/03/19 0419  AST 39  ALT 48*  ALKPHOS 172*  BILITOT 0.7  PROT 7.7   Recent Labs  Lab 06/05/19 0608 06/06/19 0832  WBC 4.4  --   HGB 8.4* 7.9*  HCT 25.8* 25.1*  PLT 193  --

## 2019-06-06 NOTE — Progress Notes (Signed)
Urology Inpatient Progress Report  Gross hematuria [R31.0] AKI (acute kidney injury) ([place]) [N17.9] Acute renal failure superimposed on chronic kidney disease, unspecified CKD stage, unspecified acute renal failure type ([place]) [N17.9, N18.9]        Intv/Subj: No acute events overnight. Patient is without complaint. He had a Foley catheter placed yesterday.  Urine output has improved.  Creatinine remains elevated but is slowly improving.  Last value was 3.53.  Hemoglobin is stable at 7.9.  After Foley catheter was placed, it was reported that his hematuria worsened.  However, urine output is clearing up.  Nursing irrigated the catheter every 4 hours to help prevent clot formation.  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 0.9 %  sodium chloride infusion   Intravenous Continuous Roney Jaffe, MD 100 mL/hr at 06/06/19 0600    . acetaminophen (TYLENOL) tablet 650 mg  650 mg Oral Q6H PRN Samuella Cota, MD       Or  . acetaminophen (TYLENOL) suppository 650 mg  650 mg Rectal Q6H PRN Samuella Cota, MD      . amLODipine (NORVASC) tablet 10 mg  10 mg Oral Daily Samuella Cota, MD   10 mg at 06/05/19 1017  . Chlorhexidine Gluconate Cloth 2 % PADS 6 each  6 each Topical Daily Domenic Polite, MD      . finasteride (PROSCAR) tablet 5 mg  5 mg Oral Daily Marton Redwood III, MD   5 mg at 06/05/19 1017  . sodium chloride flush (NS) 0.9 % injection 3 mL  3 mL Intravenous Q12H Samuella Cota, MD   3 mL at 06/04/19 1005     Objective: Vital: Vitals:   06/05/19 0933 06/05/19 1323 06/05/19 2007 06/06/19 0532  BP: (!) 151/88 (!) 142/78 (!) 145/85 (!) 154/99  Pulse: (!) 56 (!) 50 (!) 57 (!) 54  Resp: 13 16 20 20   Temp: 97.6 F  (36.4 C) 98.6 F (37 C) (!) 97.5 F (36.4 C) 98 F (36.7 C)  TempSrc:  Oral Oral Oral  SpO2: 99% 99% 96% 97%  Weight:    100.8 kg  Height:       I/Os: I/O last 3 completed shifts: In: 4448.2 [P.O.:240; I.V.:4148.2; Other:60] Out: 5125 [YCXKG:[id number]]  Physical Exam:  General: Patient is in no apparent distress Lungs: Normal respiratory effort, chest expands symmetrically. GI: The abdomen is soft and nontender without mass. Foley: Draining light pink urine Ext: lower extremities symmetric  Lab Results: Recent Labs    06/04/19 1131 06/04/19 1603 06/05/19 0608 06/06/19 0832  WBC 5.1 4.3 4.4  --   HGB 8.0* 10.0* 8.4* 7.9*  HCT 24.6* 30.9* 25.8* 25.1*   Recent Labs    06/04/19 0445 06/04/19 0456 06/05/19 0608 06/06/19 0832  NA 135 137 141 140  K 3.5 3.3* 3.1* 3.3*  CL 100 99 108 110  CO2 23  --  24 23  GLUCOSE 91 88 79 84  BUN 52* 48* 51* 44*  CREATININE 3.97* 4.00* 3.79* 3.53*  CALCIUM 9.2  --  8.8* 8.5*   Recent Labs    06/04/19 0445  INR 0.8   No results for input(s): LABURIN in the last 72 hours. Results for orders placed or performed during  the hospital encounter of 06/04/19  Urine culture     Status: Abnormal   Collection Time: 06/04/19  4:49 AM   Specimen: Urine, Clean Catch  Result Value Ref Range Status   Specimen Description   Final    URINE, CLEAN CATCH Performed at [facility], [place] [address], [place], [place] [address]    Special Requests   Final    NONE Performed at [facility], [place] [address] [place], [place] [address]    Culture (A)  Final    <10,000 COLONIES/mL INSIGNIFICANT GROWTH Performed at [place] [address], [place], [place] [address]    Report Status 06/05/2019 FINAL  Final  SARS CORONAVIRUS 2 (TAT 6-24 HRS)     Status: None   Collection Time: 06/04/19  5:05 AM  Result Value Ref Range Status   SARS Coronavirus 2 NEGATIVE NEGATIVE Final    Comment: (NOTE) SARS-CoV-2  target nucleic acids are NOT DETECTED. The SARS-CoV-2 RNA is generally detectable in upper and lower respiratory specimens during the acute phase of infection. Negative results do not preclude SARS-CoV-2 infection, do not rule out co-infections with other pathogens, and should not be used as the sole basis for treatment or other patient management decisions. Negative results must be combined with clinical observations, patient history, and epidemiological information. The expected result is Negative. Fact Sheet for Patients: SugarRoll.be Fact Sheet for Healthcare Providers: https://www.woods-mathews.com/ This test is not yet approved or cleared by the [place] FDA and  has been authorized for detection and/or diagnosis of SARS-CoV-2 by FDA under an Emergency Use Authorization (EUA). This EUA will remain  in effect (meaning this test can be used) for the duration of the COVID-19 declaration under Section 56 4(b)(1) of the Act, 21 U.S.C. section 360bbb-3(b)(1), unless the authorization is terminated or revoked sooner. Performed at [facility], [place] [address], [place], [place] [address]     Studies/Results: US Renal  Result Date: 06/04/2019 CLINICAL DATA:  Acute kidney injury. EXAM: RENAL / URINARY TRACT ULTRASOUND COMPLETE COMPARISON:  Abdominopelvic CT 06/01/2019. Renal ultrasound 06/06/2018. PET-CT 08/19/2018. FINDINGS: Right Kidney: Renal measurements: 13.0 x 5.6 x 4.9 cm = volume: 185.1 mL. There is a simple appearing cystic lesion in the upper interpolar region measuring 3.0 x 2.6 x 2.7 cm. In the lower pole, there is a mildly septated cystic lesion measuring 4.2 x 3.3 x 3.7 cm. Numerous other smaller cystic lesions are present. No hydronephrosis. Left Kidney: Renal measurements: 10.5 x 6.2 x 4.4 cm = volume: 151 mL. The known solid mass in the upper pole which was hypermetabolic on previous PET-CT shows continued enlargement as seen  on earlier CT. This now measures 7.3 x 6.0 x 8.2 cm (4.9 x 4.5 x 4.5 cm on previous ultrasound of 1 year ago). Color Doppler analysis of this lesion is suboptimal. There are additional cystic lesions in the interpolar region, measuring up to 4.2 and 3.5 cm respectively. No hydronephrosis. Bladder: Median lobe prostate hypertrophy protrudes into the bladder lumen as seen on previous studies. There is a separate filling defect which was incompletely evaluated by this examination, probably reflecting blood clot. Patient was unable to complete the examination, needing to void. IMPRESSION: 1. Known solid mass involving the upper pole of the left kidney shows continued enlargement as seen on earlier CT scan. This remains consistent with neoplasm, likely renal cell carcinoma. Please refer to recommendations on CT scan performed 06/01/2019. 2. Additional simple and complex cystic  lesions bilaterally. No hydronephrosis. 3. Filling defect in the urinary bladder lumen adjacent to an enlarged median lobe, probably blood clot. This was incompletely evaluated by this examination. Electronically Signed   By: Richardean Sale M.D.   On: 06/04/2019 11:38    Assessment: Gross hematuria Left renal neoplasm Acute renal insufficiency  Plan: Continue Foley catheter.  He will need to follow-up outpatient for cystoscopy and discussion of possible nephrectomy.   [name], MD Urology 06/06/2019, 9:25 AM

## 2019-06-06 NOTE — Progress Notes (Signed)
06/06/2019  1523  Notified MD of Pt's BP 146/101. Waiting for response/order.

## 2019-06-06 NOTE — Progress Notes (Signed)
PROGRESS NOTE    [name] [name]  GYI:[id number] DOB: 1946-03-06 DOA: 06/04/2019 PCP: Scot Jun, FNP  Brief Narrative: 73 y.o. year-old with hx of CKD3, BPH, urinary retention, multiple myeloma, COPD and renal cell carcinoma, who has refused to accept that he has cancer and refused follow-up and treatment for multiple myeloma, evaluation for renal cell carcinoma, was seen in ED for gout pain on 10/1 and was found to  have ^'d creatinine so was going to be admitted but pt refused.  He came back to the emergency room 10/2 evening he was attempting to self-cath and developed acute hematuria and came to hospital and was admitted.  Creat was 4.0.  Prior to that creat at baseline is around 1.6.  -Of note patient stopped follow-up at the cancer center, stopped follow-up with urology -Palliative care discussions in the past have only angered and irritated the patient and discussions were unsuccessful unsuccessful, attempts by PCP at contacting family were also unsuccessful. -now Urology and nephrology following  Assessment & Plan:   AKI superimposed on CKD stage III with gross hematuria.   -Baseline creatinine ranging from 1.6-2 range in 11/2018  -Admitted with creatinine of 4, actually felt to be postobstructive, unfortunately creatinine has improved marginally only, appreciate nephrology consult -Concern for potential myeloma kidney -IV fluids cut down -Monitor, urine output, bmet -Given patient's long consistent behavior with noncompliance and acknowledging serious medical diagnoses I feel that he would be a very poor candidate for hemodialysis, this is acknowledged by nephrology as well, palliative care consult requested for goals of care -Unfortunately also has large left renal neoplasm, which is enlarging in size, concerning for RCC, ideally needs nephrectomy however given marginal kidney function -highly concerning, complex decision -I called and updated son [name] [name]  Hematuria, BPH -  could be secondary to massively distended bladder with chronic prostatic enlargement, worsened by suspected traumatic catheterization -Mild worsening after Foley catheter placement yesterday, appears to be clearing -Started on finasteride -Neurology recommended outpatient follow-up for cystoscopy and discussion of possible nephrectomy  Acute gout left great toe.  Imaging 10/1 consistent with gout. --Denies active pain at this time, add allopurinol  Multiple myeloma, light chain -Diagnosed in 06/2018, briefly treated at [facility] cancer center with Velcade and Rituxan by Dr.Kale, after couple of months stopped following up at the cancer center, reportedly had some side effects from chemo  -Concern for possible kidney involvement now -See discussion above, will notify Dr.Kale on Monday  Left renal cell carcinoma diagnosed February 2020.  CT abdomen pelvis 9/29 showed significant interval growth left renal cortical mass which was hypermetabolic on PET scan December 2019, findings compatible with large growing renal cell carcinoma.  Numerous additional complex bilateral renal masses were noted and could be additional renal cell carcinomas.  MRI with and without contrast recommended recommended. -Patient has declined to follow-up with urology in the past -Urology following, they have recommended outpatient follow-up for discussion of nephrectomy  Macrocytic anemia -This is multifactorial, secondary to multiple myeloma, component of CKD 4 also felt to be contributing -FU anemia panel, mild worsening with dilution noted  Diabetes mellitus type 2 --Patient denies this diagnosis and takes no medications for this.  His hemoglobin A1c was 5.4 in January 2020. --CBGs controlled, somewhat on the lower side, monitor  COPD -Per chart review -Asymptomatic, monitor  Paroxysmal atrial fibrillation -Currently in sinus rhythm, not on rate control agents -Has declined to take anticoagulation in the  past and be highly noncompliant patient based  on past behavior, also current hematuria prohibits this  Ventral abdominal hernia --Asymptomatic.  No signs of obstruction.  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  --Recommend follow-up aortic ultrasound in 5 years.  Aortic atherosclerosis  DVT prophylaxis: SCDs Code Status: Full code Family Communication: No family at bedside, called and updated son [name] [name] Disposition Plan: To be determined  Consultants:   Urology Renal   Procedures:   Antimicrobials:    Subjective: -Reports that he feels a little better, denies any nausea vomiting, denies dyspnea -Reports that his urine is starting to clear up  Objective: Vitals:   06/05/19 1323 06/05/19 2007 06/06/19 0532 06/06/19 1025  BP: (!) 142/78 (!) 145/85 (!) 154/99 (!) 149/100  Pulse: (!) 50 (!) 57 (!) 54 (!) 58  Resp: 16 20 20    Temp: 98.6 F (37 C) (!) 97.5 F (36.4 C) 98 F (36.7 C)   TempSrc: Oral Oral Oral   SpO2: 99% 96% 97%   Weight:   100.8 kg   Height:        Intake/Output Summary (Last 24 hours) at 06/06/2019 1341 Last data filed at 06/06/2019 1039 Gross per 24 hour  Intake 3183.84 ml  Output 2000 ml  Net 1183.84 ml   Filed Weights   06/04/19 1408 06/05/19 0500 06/06/19 0532  Weight: 97.5 kg 104.1 kg 100.8 kg    Examination:  Gen: Awake, Alert, Oriented X 2, no distress HEENT: PERRLA, Neck supple, no JVD Lungs: Poor air movement bilaterally  CVS: RRR,No Gallops,Rubs or new Murmurs Abd: Soft obese nondistended, large ventral hernia, bowel sounds present Extremities: No edema Skin: no new rashes Psychiatry: Poor insight and judgment   Data Reviewed:   CBC: Recent Labs  Lab 06/03/19 0419 06/04/19 0445 06/04/19 0456 06/04/19 1131 06/04/19 1603 06/05/19 0608 06/06/19 0832  WBC 6.4 6.0  --  5.1 4.3 4.4  --   NEUTROABS 4.2 3.9  --   --   --   --   --   HGB 8.4* 9.0* 9.5* 8.0* 10.0* 8.4* 7.9*   HCT 26.6* 28.0* 28.0* 24.6* 30.9* 25.8* 25.1*  MCV 100.8* 101.4*  --  100.8* 101.6* 101.2*  --   PLT 216 218  --  189 177 193  --    Basic Metabolic Panel: Recent Labs  Lab 06/03/19 0419 06/04/19 0445 06/04/19 0456 06/05/19 0608 06/06/19 0832  NA 138 135 137 141 140  K 3.3* 3.5 3.3* 3.1* 3.3*  CL 103 100 99 108 110  CO2 24 23  --  24 23  GLUCOSE 97 91 88 79 84  BUN 53* 52* 48* 51* 44*  CREATININE 3.83* 3.97* 4.00* 3.79* 3.53*  CALCIUM 9.4 9.2  --  8.8* 8.5*   GFR: Estimated Creatinine Clearance: 20.5 mL/min (A) (by C-G formula based on SCr of 3.53 mg/dL (H)). Liver Function Tests: Recent Labs  Lab 06/03/19 0419  AST 39  ALT 48*  ALKPHOS 172*  BILITOT 0.7  PROT 7.7  ALBUMIN 4.3   No results for input(s): LIPASE, AMYLASE in the last 168 hours. No results for input(s): AMMONIA in the last 168 hours. Coagulation Profile: Recent Labs  Lab 06/04/19 0445  INR 0.8   Cardiac Enzymes: No results for input(s): CKTOTAL, CKMB, CKMBINDEX, TROPONINI in the last 168 hours. BNP (last 3 results) No results for input(s): PROBNP in the last 8760 hours. HbA1C: No results for input(s): HGBA1C in the last 72 hours. CBG: No results for input(s): GLUCAP in  the last 168 hours. Lipid Profile: No results for input(s): CHOL, HDL, LDLCALC, TRIG, CHOLHDL, LDLDIRECT in the last 72 hours. Thyroid Function Tests: No results for input(s): TSH, T4TOTAL, FREET4, T3FREE, THYROIDAB in the last 72 hours. Anemia Panel: No results for input(s): VITAMINB12, FOLATE, FERRITIN, TIBC, IRON, RETICCTPCT in the last 72 hours. Urine analysis:    Component Value Date/Time   COLORURINE RED (A) 06/04/2019 0449   APPEARANCEUR TURBID (A) 06/04/2019 0449   LABSPEC 1.015 06/04/2019 0449   PHURINE 6.5 06/04/2019 0449   GLUCOSEU NEGATIVE 06/04/2019 0449   HGBUR LARGE (A) 06/04/2019 0449   BILIRUBINUR NEGATIVE 06/04/2019 0449   KETONESUR NEGATIVE 06/04/2019 0449   PROTEINUR >300 (A) 06/04/2019 0449    NITRITE POSITIVE (A) 06/04/2019 0449   LEUKOCYTESUR TRACE (A) 06/04/2019 0449   Sepsis Labs: @LABRCNTIP (procalcitonin:4,lacticidven:4)  ) Recent Results (from the past 240 hour(s))  Urine culture     Status: Abnormal   Collection Time: 06/04/19  4:49 AM   Specimen: Urine, Clean Catch  Result Value Ref Range Status   Specimen Description   Final    URINE, CLEAN CATCH Performed at [facility], [place] [address] [place], [place] [address]    Special Requests   Final    NONE Performed at [facility], [place] [address], [place], [place] [address]    Culture (A)  Final    <10,000 COLONIES/mL INSIGNIFICANT GROWTH Performed at [place] [address] [place], [place] [address]    Report Status 06/05/2019 FINAL  Final  SARS CORONAVIRUS 2 (TAT 6-24 HRS)     Status: None   Collection Time: 06/04/19  5:05 AM  Result Value Ref Range Status   SARS Coronavirus 2 NEGATIVE NEGATIVE Final    Comment: (NOTE) SARS-CoV-2 target nucleic acids are NOT DETECTED. The SARS-CoV-2 RNA is generally detectable in upper and lower respiratory specimens during the acute phase of infection. Negative results do not preclude SARS-CoV-2 infection, do not rule out co-infections with other pathogens, and should not be used as the sole basis for treatment or other patient management decisions. Negative results must be combined with clinical observations, patient history, and epidemiological information. The expected result is Negative. Fact Sheet for Patients: SugarRoll.be Fact Sheet for Healthcare Providers: https://www.woods-mathews.com/ This test is not yet approved or cleared by the [place] FDA and  has been authorized for detection and/or diagnosis of SARS-CoV-2 by FDA under an Emergency Use Authorization (EUA). This EUA will remain  in effect (meaning this test can be used) for the duration of the COVID-19  declaration under Section 56 4(b)(1) of the Act, 21 U.S.C. section 360bbb-3(b)(1), unless the authorization is terminated or revoked sooner. Performed at [facility], [place] [address], [place], [place] [address]          Radiology Studies: No results found.      Scheduled Meds: . amLODipine  10 mg Oral Daily  . Chlorhexidine Gluconate Cloth  6 each Topical Daily  . finasteride  5 mg Oral Daily  . sodium chloride flush  3 mL Intravenous Q12H   Continuous Infusions: . sodium chloride 50 mL/hr at 06/06/19 1039     LOS: 2 days    Time spent: 1mn  PDomenic Polite MD [facility]  06/06/2019, 1:41 PM

## 2019-06-06 NOTE — Plan of Care (Signed)
  Problem: Activity: Goal: Risk for activity intolerance will decrease Outcome: Progressing   Problem: Nutrition: Goal: Adequate nutrition will be maintained Outcome: Progressing   Problem: Pain Managment: Goal: General experience of comfort will improve Outcome: Progressing   

## 2019-06-07 LAB — CBC
HCT: 25.5 % — ABNORMAL LOW (ref 39.0–52.0)
Hemoglobin: 8.2 g/dL — ABNORMAL LOW (ref 13.0–17.0)
MCH: 32.5 pg (ref 26.0–34.0)
MCHC: 32.2 g/dL (ref 30.0–36.0)
MCV: 101.2 fL — ABNORMAL HIGH (ref 80.0–100.0)
Platelets: 225 10*3/uL (ref 150–400)
RBC: 2.52 MIL/uL — ABNORMAL LOW (ref 4.22–5.81)
RDW: 12.5 % (ref 11.5–15.5)
WBC: 4.5 10*3/uL (ref 4.0–10.5)
nRBC: 0 % (ref 0.0–0.2)

## 2019-06-07 LAB — BASIC METABOLIC PANEL
Anion gap: 9 (ref 5–15)
BUN: 45 mg/dL — ABNORMAL HIGH (ref 8–23)
CO2: 23 mmol/L (ref 22–32)
Calcium: 8.9 mg/dL (ref 8.9–10.3)
Chloride: 108 mmol/L (ref 98–111)
Creatinine, Ser: 3.54 mg/dL — ABNORMAL HIGH (ref 0.61–1.24)
GFR calc Af Amer: 18 mL/min — ABNORMAL LOW (ref >60)
GFR calc non Af Amer: 16 mL/min — ABNORMAL LOW (ref >60)
Glucose, Bld: 86 mg/dL (ref 70–99)
Potassium: 3.4 mmol/L — ABNORMAL LOW (ref 3.5–5.1)
Sodium: 140 mmol/L (ref 135–145)

## 2019-06-07 LAB — RETICULOCYTES
Immature Retic Fract: 15.8 % (ref 2.3–15.9)
RBC.: 2.52 MIL/uL — ABNORMAL LOW (ref 4.22–5.81)
Retic Count, Absolute: 30 10*3/uL (ref 19.0–186.0)
Retic Ct Pct: 1.2 % (ref 0.4–3.1)

## 2019-06-07 LAB — IRON AND TIBC
Iron: 87 ug/dL (ref 45–182)
Saturation Ratios: 33 % (ref 17.9–39.5)
TIBC: 267 ug/dL (ref 250–450)
UIBC: 180 ug/dL

## 2019-06-07 LAB — PROTEIN / CREATININE RATIO, URINE
Creatinine, Urine: 112.2 mg/dL
Protein Creatinine Ratio: 5 mg/mg{Cre} — ABNORMAL HIGH (ref 0.00–0.15)
Total Protein, Urine: 561 mg/dL

## 2019-06-07 LAB — FOLATE: Folate: 12 ng/mL (ref >5.9)

## 2019-06-07 LAB — KAPPA/LAMBDA LIGHT CHAINS
Kappa free light chain: 8761.3 mg/L — ABNORMAL HIGH (ref 3.3–19.4)
Kappa, lambda light chain ratio: 377.64 — ABNORMAL HIGH (ref 0.26–1.65)
Lambda free light chains: 23.2 mg/L (ref 5.7–26.3)

## 2019-06-07 LAB — VITAMIN B12: Vitamin B-12: 419 pg/mL (ref 180–914)

## 2019-06-07 LAB — FERRITIN: Ferritin: 340 ng/mL — ABNORMAL HIGH (ref 24–336)

## 2019-06-07 MED ORDER — POTASSIUM CHLORIDE CRYS ER 20 MEQ PO TBCR
40.0000 meq | EXTENDED_RELEASE_TABLET | Freq: Once | ORAL | Status: AC
  Administered 2019-06-07: 40 meq via ORAL
  Filled 2019-06-07: qty 2

## 2019-06-07 NOTE — Progress Notes (Signed)
Responded to spiritual care consult. Ebin was alert and sitting up in his chair. No family present. Zavior mentioned he was not sure what will happen the next few days concerning the direction of where he is going. Forrest said that he's gonna leave it up to the [facility] for direction. He mentioned he has strong faith and a great prayer team. He has a Geneticist, molecular home that he is in contact with.  I offered spiritual care with empathic listening, ministry of presence and prayer. Chaplain available as needed.   Palliative care Resident  Chaplain Fidel Levy  [phone]

## 2019-06-07 NOTE — Progress Notes (Signed)
[facility] Progress Note    Assessment/ Plan:   Assessment/ Plan: 1. AKI on CKD3 - possibly related to bladder clot/ urinary retention. AKI pk creat 4.0 > 3.7 > 3.5--> 3.5.  Baseline creat 1.6- 2.3. No hydro on imaging and lack of sig improvement is concerning, could have myeloma kidney w/ hx of untreated myeloma. Get serum FLC ratio--> pending, will also do UP/C today and do 24 hr UPEP.  No indication for RRT, not uremic.  Poor compliance w/ ONC situation and denial of renal cancer, not sure patient would do well on dialysis and that is something that could happen easily w/ untreated myeloma. Recommend palliative care consult for GOC and appreciate their involvement.     2. Hematuria - trauma from attempted self-cath, hx BPH, improving. Foley in place, urology following.  3. Renal mass - supsected RCC 4. HTN - on amlodipine 5. COPD  6. Multiple myeloma - diagnosed late 2019, took chemoRx for a short while and hasn't been back to ONC, lost to f/u.  7. Dispo: pending  Subjective:    Still continues with robust UOP.  Feeling OK, sitting up in chair.     Objective:   BP (!) 152/90 (BP Location: Left Arm)   Pulse (!) 56   Temp 98.4 F (36.9 C) (Oral)   Resp 20   Ht 5' 7.01" (1.702 m)   Wt 102 kg   SpO2 96%   BMI 35.21 kg/m   Intake/Output Summary (Last 24 hours) at 06/07/2019 1259 Last data filed at 06/07/2019 0856 Gross per 24 hour  Intake 567.09 ml  Output 3200 ml  Net -2632.91 ml   Weight change: 1.2 kg  Physical Exam: Gen: older gentleman, NAD CVS: RRR no m/r/g Resp: clear bilaterally no c/w/r Abd: soft, nondistended, bladder not palpable GU: + Foley in place, draining yellow urine, some sediment but per pt clearing Ext: 1+ LE edema, some hypopigmented areas around ankles at prior site of ulcers  Imaging: No results found.  Labs: BMET Recent Labs  Lab 06/03/19 0419 06/04/19 0445 06/04/19 0456 06/05/19 0608 06/06/19 0832 06/07/19 0554  NA 138  135 137 141 140 140  K 3.3* 3.5 3.3* 3.1* 3.3* 3.4*  CL 103 100 99 108 110 108  CO2 24 23  --  _0 GLUCOSE 97 91 88 79 84 86  BUN 53* 52* 48* 51* 44* 45*  CREATININE 3.83* 3.97* 4.00* 3.79* 3.53* 3.54*  CALCIUM 9.4 9.2  --  8.8* 8.5* 8.9   CBC Recent Labs  Lab 06/03/19 0419 06/04/19 0445  06/04/19 1131 06/04/19 1603 06/05/19 0608 06/06/19 0832 06/07/19 0554  WBC 6.4 6.0  --  5.1 4.3 4.4  --  4.5  NEUTROABS 4.2 3.9  --   --   --   --   --   --   HGB 8.4* 9.0*   < > 8.0* 10.0* 8.4* 7.9* 8.2*  HCT 26.6* 28.0*   < > 24.6* 30.9* 25.8* 25.1* 25.5*  MCV 100.8* 101.4*  --  100.8* 101.6* 101.2*  --  101.2*  PLT 216 218  --  189 177 193  --  225   < > = values in this interval not displayed.    Medications:    . amLODipine  10 mg Oral Daily  . Chlorhexidine Gluconate Cloth  6 each Topical Daily  . finasteride  5 mg Oral Daily  . potassium chloride  40 mEq Oral Once  . sodium chloride  flush  3 mL Intravenous Q12H      Madelon Lips, MD 06/07/2019, 12:59 PM

## 2019-06-07 NOTE — Progress Notes (Addendum)
PROGRESS NOTE    ABHIJAY MORRISS  AYO:[id number] DOB: 01-16-1946 DOA: 06/04/2019 PCP: Scot Jun, FNP  Brief Narrative: 73 y.o. year-old with hx of CKD3, BPH, urinary retention, multiple myeloma, COPD and renal cell carcinoma, who has refused to accept that he has cancer and refused follow-up and treatment for multiple myeloma, evaluation for renal cell carcinoma, was seen in ED for gout pain on 10/1 and was found to  have ^'d creatinine so was going to be admitted but pt refused.  He came back to the emergency room 10/2 evening he was attempting to self-cath and developed acute hematuria and came to hospital and was admitted.  Creat was 4.0.  Prior to that creat at baseline is around 1.6.  -Of note patient stopped follow-up at the cancer center, stopped follow-up with urology -Palliative care discussions in the past have only angered and irritated the patient and discussions were unsuccessful unsuccessful, attempts by PCP at contacting family were also unsuccessful. -now Urology and nephrology following  Assessment & Plan:   AKI superimposed on CKD stage III with gross hematuria.   -Baseline creatinine ranging from 1.6-2 range in 11/2018  -Admitted with creatinine of 4, actually felt to be postobstructive, unfortunately creatinine has improved marginally only, appreciate nephrology consult -Creatinine now stable in the 3.5 range, indicates stage IV disease, possibly myeloma kidney, light chains ordered and pending, IV fluids discontinued -Given patient's long consistent behavior with noncompliance and acknowledging serious medical diagnoses I feel that he would be a very poor candidate for hemodialysis, this is acknowledged by nephrology as well, palliative care consult requested for goals of care -Nephrology following, patient indicates that he wants to be a full code and continue full scope of treatment, remains to be seen if he will actually follow-up given that he refused to follow-up this  whole year -Unfortunately also has large left renal neoplasm, which is enlarging in size, concerning for RCC, ideally needs left nephrectomy however given marginal kidney function -highly concerning, complex decision -I called and updated son [name] -Palliative recommended continuing discussions and palliative follow-up at discharge  Hematuria, BPH - could be secondary to massively distended bladder with chronic prostatic enlargement, worsened by suspected traumatic catheterization -Mild worsening after Foley catheter placement yesterday, appears to be clearing -Started on finasteride -Urology recommended outpatient follow-up for cystoscopy and discussion of possible nephrectomy  Acute gout left great toe.  Imaging 10/1 consistent with gout. --Denies active pain at this time, add allopurinol  Multiple myeloma, light chain -Diagnosed in 06/2018, briefly treated at [facility] with Velcade and Rituxan by Dr.Kale, after couple of months stopped following up at the cancer center, reportedly had some side effects from chemo  -Concern for possible kidney involvement now -See discussion above, left message for Dr.Kale -Based on discussion now, patient indicates that he would like to follow-up at the cancer center, this remains to be seen, called and discussed with family as well yesterday and recommended that they follow-up with the patient's doctors  Left renal cell carcinoma diagnosed February 2020.  CT abdomen pelvis 9/29 showed significant interval growth left renal cortical mass which was hypermetabolic on PET scan December 2019, findings compatible with large growing renal cell carcinoma.  Numerous additional complex bilateral renal masses were noted and could be additional renal cell carcinomas.  MRI with and without contrast recommended recommended. -Patient has declined to follow-up with urology in the past -Urology following, they have recommended outpatient follow-up  for discussion of nephrectomy  Macrocytic anemia -This is multifactorial, secondary to multiple myeloma, component of CKD 4 also felt to be contributing -Anemia panel with chronic disease -Monitor  Diabetes mellitus type 2 --Patient denies this diagnosis and takes no medications for this.  His hemoglobin A1c was 5.4 in January 2020. --CBGs controlled, somewhat on the lower side, monitor  COPD -Per chart review -Asymptomatic, monitor  Paroxysmal atrial fibrillation -Currently in sinus rhythm, not on rate control agents -Has declined to take anticoagulation in the past and be highly noncompliant patient based on past behavior, also current hematuria prohibits this  Ventral abdominal hernia --Asymptomatic.  No signs of obstruction.  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  --Recommend follow-up aortic ultrasound in 5 years.  Aortic atherosclerosis  DVT prophylaxis: SCDs Code Status: Full code Family Communication: No family at bedside, called and updated son [name] 10/5 Disposition Plan: Home pending appropriate plan  Consultants:   Urology Renal   Procedures:   Antimicrobials:    Subjective: -Feels better, feels well overall room urine remains clear, he is anxious to get the Foley catheter out I told him that this will likely need to stay at discharge until urology follow-up Objective: Vitals:   06/06/19 1025 06/06/19 1516 06/06/19 2054 06/07/19 0529  BP: (!) 149/100 (!) 146/101 (!) 151/95 (!) 152/90  Pulse: (!) 58 (!) 58 64 (!) 56  Resp:   20 20  Temp:  (!) 97.5 F (36.4 C) 98.7 F (37.1 C) 98.4 F (36.9 C)  TempSrc:  Oral Oral Oral  SpO2:  98% 98% 96%  Weight:    102 kg  Height:        Intake/Output Summary (Last 24 hours) at 06/07/2019 1522 Last data filed at 06/07/2019 1427 Gross per 24 hour  Intake 567.09 ml  Output 2600 ml  Net -2032.91 ml   Filed Weights   06/05/19 0500 06/06/19 0532 06/07/19  0529  Weight: 104.1 kg 100.8 kg 102 kg    Examination:  Gen: Awake, Alert, Oriented X 3,  HEENT: PERRLA, Neck supple, no JVD Lungs: CTAB CVS: RRR,No Gallops,Rubs or new Murmurs Abd: Soft obese nontender, large ventral hernia, bowel sounds present Extremities: No Cyanosis, Clubbing or edema Skin: no new rashes Psychiatry: Poor insight and judgment   Data Reviewed:   CBC: Recent Labs  Lab 06/03/19 0419 06/04/19 0445  06/04/19 1131 06/04/19 1603 06/05/19 0608 06/06/19 0832 06/07/19 0554  WBC 6.4 6.0  --  5.1 4.3 4.4  --  4.5  NEUTROABS 4.2 3.9  --   --   --   --   --   --   HGB 8.4* 9.0*   < > 8.0* 10.0* 8.4* 7.9* 8.2*  HCT 26.6* 28.0*   < > 24.6* 30.9* 25.8* 25.1* 25.5*  MCV 100.8* 101.4*  --  100.8* 101.6* 101.2*  --  101.2*  PLT 216 218  --  189 177 193  --  225   < > = values in this interval not displayed.   Basic Metabolic Panel: Recent Labs  Lab 06/03/19 0419 06/04/19 0445 06/04/19 0456 06/05/19 0608 06/06/19 0832 06/07/19 0554  NA 138 135 137 141 140 140  K 3.3* 3.5 3.3* 3.1* 3.3* 3.4*  CL 103 100 99 108 110 108  CO2 24 23  --  _0 GLUCOSE 97 91 88 79 84 86  BUN 53* 52* 48* 51* 44* 45*  CREATININE 3.83* 3.97* 4.00* 3.79* 3.53* 3.54*  CALCIUM 9.4 9.2  --  8.8* 8.5* 8.9   GFR: Estimated Creatinine Clearance: 20.5 mL/min (A) (by C-G formula based on SCr of 3.54 mg/dL (H)). Liver Function Tests: Recent Labs  Lab 06/03/19 0419  AST 39  ALT 48*  ALKPHOS 172*  BILITOT 0.7  PROT 7.7  ALBUMIN 4.3   No results for input(s): LIPASE, AMYLASE in the last 168 hours. No results for input(s): AMMONIA in the last 168 hours. Coagulation Profile: Recent Labs  Lab 06/04/19 0445  INR 0.8   Cardiac Enzymes: No results for input(s): CKTOTAL, CKMB, CKMBINDEX, TROPONINI in the last 168 hours. BNP (last 3 results) No results for input(s): PROBNP in the last 8760 hours. HbA1C: No results for input(s): HGBA1C in the last 72 hours. CBG: No results for  input(s): GLUCAP in the last 168 hours. Lipid Profile: No results for input(s): CHOL, HDL, LDLCALC, TRIG, CHOLHDL, LDLDIRECT in the last 72 hours. Thyroid Function Tests: No results for input(s): TSH, T4TOTAL, FREET4, T3FREE, THYROIDAB in the last 72 hours. Anemia Panel: Recent Labs    06/07/19 0554  VITAMINB12 419  FOLATE 12.0  FERRITIN 340*  TIBC 267  IRON 87  RETICCTPCT 1.2   Urine analysis:    Component Value Date/Time   COLORURINE RED (A) 06/04/2019 0449   APPEARANCEUR TURBID (A) 06/04/2019 0449   LABSPEC 1.015 06/04/2019 0449   PHURINE 6.5 06/04/2019 0449   GLUCOSEU NEGATIVE 06/04/2019 0449   HGBUR LARGE (A) 06/04/2019 0449   BILIRUBINUR NEGATIVE 06/04/2019 0449   KETONESUR NEGATIVE 06/04/2019 0449   PROTEINUR >300 (A) 06/04/2019 0449   NITRITE POSITIVE (A) 06/04/2019 0449   LEUKOCYTESUR TRACE (A) 06/04/2019 0449   Sepsis Labs: _0 (procalcitonin:4,lacticidven:4)  ) Recent Results (from the past 240 hour(s))  Urine culture     Status: Abnormal   Collection Time: 06/04/19  4:49 AM   Specimen: Urine, Clean Catch  Result Value Ref Range Status   Specimen Description   Final    URINE, CLEAN CATCH Performed at [facility], [place] [address], [place], [place] [address]    Special Requests   Final    NONE Performed at [facility], [place] [address] [place], [place] [address]    Culture (A)  Final    <10,000 COLONIES/mL INSIGNIFICANT GROWTH Performed at [place] [address] [place],  [address]    Report Status 06/05/2019 FINAL  Final  SARS CORONAVIRUS 2 (TAT 6-24 HRS)     Status: None   Collection Time: 06/04/19  5:05 AM  Result Value Ref Range Status   SARS Coronavirus 2 NEGATIVE NEGATIVE Final    Comment: (NOTE) SARS-CoV-2 target nucleic acids are NOT DETECTED. The SARS-CoV-2 RNA is generally detectable in upper and lower respiratory specimens during the acute phase of infection.  Negative results do not preclude SARS-CoV-2 infection, do not rule out co-infections with other pathogens, and should not be used as the sole basis for treatment or other patient management decisions. Negative results must be combined with clinical observations, patient history, and epidemiological information. The expected result is Negative. Fact Sheet for Patients: SugarRoll.be Fact Sheet for Healthcare Providers: https://www.woods-mathews.com/ This test is not yet approved or cleared by the [place] FDA and  has been authorized for detection and/or diagnosis of SARS-CoV-2 by FDA under an Emergency Use Authorization (EUA). This EUA will remain  in effect (meaning this test can be used) for the duration of the COVID-19 declaration under Section 56 4(b)(1) of the Act, 21 U.S.C. section 360bbb-3(b)(1), unless  the authorization is terminated or revoked sooner. Performed at [facility], [place] [address], [place], [place] [address]          Radiology Studies: No results found.      Scheduled Meds: . amLODipine  10 mg Oral Daily  . Chlorhexidine Gluconate Cloth  6 each Topical Daily  . finasteride  5 mg Oral Daily  . sodium chloride flush  3 mL Intravenous Q12H   Continuous Infusions:    LOS: 3 days    Time spent: 63mn  PDomenic Polite MD [facility]  06/07/2019, 3:22 PM

## 2019-06-07 NOTE — Progress Notes (Signed)
PMT progress note  Patient sitting up in chair, no distress, talking on phone. States hematuria is much improved. As of this morning, he is considering nephrectomy, states he will go see his cardiologist later this month, in case he has to get surgery. He wishes to discuss further with his urologist. No family at bedside.   BP (!) 152/90 (BP Location: Left Arm)   Pulse (!) 56   Temp 98.4 F (36.9 C) (Oral)   Resp 20   Ht 5' 7.01" (1.702 m)   Wt 102 kg   SpO2 96%   BMI 35.21 kg/m  Labs and imaging noted.   Awake alert No distress Regular S1 S2 Abdominal distension is much improved Trace edema Has foley   PPS 40%  AKI Hematuria III CKD Multiple myeloma Renal cell carcinoma  Recommend outpatient palliative care follow up after discharge.  No additional PMT specific recommendations at this time.  15 minutes spent Greater than 50%  of this time was spent counseling and coordinating care related to the above assessment and plan. Loistine Chance MD [facility] team [phone]

## 2019-06-07 NOTE — Care Management Important Message (Signed)
Important Message  Patient Details IM Letter given to Sharren Bridge SW to present to the Patient Name: [name] [name] MRN: [id number] Date of Birth: [birth year]   Medicare Important Message Given:  Yes     Kerin Salen 06/07/2019, 10:34 AM

## 2019-06-08 ENCOUNTER — Other Ambulatory Visit: Payer: Self-pay | Admitting: *Deleted

## 2019-06-08 ENCOUNTER — Telehealth: Payer: Self-pay | Admitting: Hematology

## 2019-06-08 ENCOUNTER — Ambulatory Visit: Payer: Medicare Other | Admitting: Podiatry

## 2019-06-08 DIAGNOSIS — C9000 Multiple myeloma not having achieved remission: Secondary | ICD-10-CM

## 2019-06-08 LAB — BASIC METABOLIC PANEL
Anion gap: 12 (ref 5–15)
BUN: 51 mg/dL — ABNORMAL HIGH (ref 8–23)
CO2: 21 mmol/L — ABNORMAL LOW (ref 22–32)
Calcium: 8.9 mg/dL (ref 8.9–10.3)
Chloride: 107 mmol/L (ref 98–111)
Creatinine, Ser: 3.74 mg/dL — ABNORMAL HIGH (ref 0.61–1.24)
GFR calc Af Amer: 17 mL/min — ABNORMAL LOW (ref >60)
GFR calc non Af Amer: 15 mL/min — ABNORMAL LOW (ref >60)
Glucose, Bld: 87 mg/dL (ref 70–99)
Potassium: 3.6 mmol/L (ref 3.5–5.1)
Sodium: 140 mmol/L (ref 135–145)

## 2019-06-08 LAB — CBC
HCT: 24.3 % — ABNORMAL LOW (ref 39.0–52.0)
Hemoglobin: 8.1 g/dL — ABNORMAL LOW (ref 13.0–17.0)
MCH: 32.4 pg (ref 26.0–34.0)
MCHC: 33.3 g/dL (ref 30.0–36.0)
MCV: 97.2 fL (ref 80.0–100.0)
Platelets: 179 10*3/uL (ref 150–400)
RBC: 2.5 MIL/uL — ABNORMAL LOW (ref 4.22–5.81)
RDW: 12.4 % (ref 11.5–15.5)
WBC: 4.7 10*3/uL (ref 4.0–10.5)
nRBC: 0 % (ref 0.0–0.2)

## 2019-06-08 NOTE — Telephone Encounter (Signed)
Scheduled appt per 10/6 sch message - unable to reach pt . Left message with appt date and time

## 2019-06-08 NOTE — Progress Notes (Addendum)
PROGRESS NOTE    [name] [name]  RSW:[id number] DOB: [birth year] DOA: 06/04/2019 PCP: Scot Jun, FNP  Brief Narrative: 73 y.o. year-old with hx of CKD3, BPH, urinary retention, multiple myeloma, COPD and renal cell carcinoma, who had refused to accept that he has cancer and refused follow-up for treatment of multiple myeloma, evaluation for renal cell carcinoma, was seen in ED for gout pain on 10/1 and was found to  have ^'d creatinine, patient was advised but pt refused.  He came back to the emergency room 10/2 evening he was attempting to self-cath and developed acute hematuria and came to ER and subsequently admitted.  Creat was 4.0.  Prior to that creat at baseline is around 1.6.  -Of note patient stopped follow-up at the cancer center 9 to 10 months ago, stopped follow-up with urology -Palliative care discussions in the past have angered and irritated the patient and discussions were unsuccessful, attempts by PCP at contacting family were also unsuccessful. -now Urology and nephrology following -Foley catheter placed, hematuria improving, creatinine stable in 3.7-4 range  Assessment & Plan:   AKI superimposed on CKD stage III with gross hematuria.   -Baseline creatinine ranging from 1.6-2 range in 11/2018  -Admitted with creatinine of 4, actually felt to be postobstructive, unfortunately creatinine has improved marginally only, appreciate nephrology consult -Creatinine now stable in the 3.5-3.7 range, indicates stage IV disease, possibly myeloma kidney, light chains ordered and pending, IV fluids discontinued -Given patient's long consistent behavior with noncompliance and acknowledging serious medical diagnoses I feel that he would be a very poor candidate for hemodialysis, this is acknowledged by nephrology as well, palliative care consult requested for goals of care -Nephrology following, patient indicates that he wants to be a full code and continue full scope of treatment, remains to  be seen if he will actually follow-up -Unfortunately also has large left renal neoplasm, which is enlarging in size, concerning for RCC, ideally needs left nephrectomy however given marginal kidney function -highly complex decision- may end up on dialysis post op, requested [place] -Urology to weight in, he will see pt today -I called and updated son [name] -Palliative recommended continuing discussions and palliative follow-up at discharge -FU Urine studies per Renal and then FU with Oncology  Hematuria, BPH - could be secondary to massively distended bladder with chronic prostatic enlargement, worsened by suspected traumatic catheterization -clearing now, started on finasteride -Urology recommended outpatient follow-up for cystoscopy   Acute gout left great toe.  Imaging 10/1 consistent with gout. --Denies active pain at this time, add allopurinol at discharge  Multiple myeloma, light chain -Diagnosed in 06/2018, briefly treated at [facility] center with Velcade and Rituxan by Dr.Kale, after couple of months stopped following up at the cancer center, reportedly had some side effects from chemo  -Concern for possible myeloma kidney now -See discussion above, I d/w Dr.Kale, and palliative team, Dr.Kale's office willing to give pt one more chance and they will call pt for FU with labs in 2 weeks -patient indicates now that he would like to follow-up at the cancer center, this remains to be seen, called and discussed with family as well and recommended that they follow-up with the patient's doctors  Left renal cell carcinoma diagnosed February 2020.  CT abdomen pelvis 9/29 showed significant interval growth left renal cortical mass which was hypermetabolic on PET scan December 2019, findings compatible with large growing renal cell carcinoma.  Numerous additional complex bilateral renal masses were noted and could  be additional renal cell carcinomas.  MRI with and without  contrast recommended recommended. -Patient has declined to follow-up with urology in the past -Urology following, see above discussion, Dr.Herrick to FU today  Macrocytic anemia -This is multifactorial, secondary to multiple myeloma, component of CKD 4 also felt to be contributing -Anemia panel with chronic disease -Monitor  Diabetes mellitus type 2 --Patient denies this diagnosis and takes no medications for this.  His hemoglobin A1c was 5.4 in January 2020. --CBGs controlled, somewhat on the lower side, monitor  COPD -Per chart review -Asymptomatic, monitor  Paroxysmal atrial fibrillation -Currently in sinus rhythm, not on rate control agents -Has declined to take anticoagulation in the past and be highly noncompliant patient based on past behavior, also current hematuria prohibits this  Ventral abdominal hernia --Asymptomatic.  No signs of obstruction.  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  --Recommend follow-up aortic ultrasound in 5 years.  Aortic atherosclerosis  DVT prophylaxis: SCDs Code Status: Full code Family Communication: No family at bedside, called and updated son [name] 10/4 and 10/6 Disposition Plan: Home pending appropriate plan  Consultants:   Urology Renal Dr.Kale aware of hospitalization   Procedures:   Antimicrobials:    Subjective: -Feels better, wants to remove his catheter and go home  Objective: Vitals:   06/07/19 0529 06/07/19 2205 06/08/19 0610 06/08/19 1356  BP: (!) 152/90 (!) 154/87 (!) 145/83 (!) 147/92  Pulse: (!) 56 (!) 57 (!) 59 68  Resp: _0 Temp: 98.4 F (36.9 C) 97.9 F (36.6 C) 97.7 F (36.5 C) (!) 97.4 F (36.3 C)  TempSrc: Oral   Oral  SpO2: 96% 100% 99% 100%  Weight: 102 kg     Height:        Intake/Output Summary (Last 24 hours) at 06/08/2019 1435 Last data filed at 06/08/2019 0915 Gross per 24 hour  Intake 1320 ml  Output 700 ml  Net 620 ml    Filed Weights   06/05/19 0500 06/06/19 0532 06/07/19 0529  Weight: 104.1 kg 100.8 kg 102 kg    Examination:  Gen: Awake, Alert, Oriented X 3, no distress HEENT: PERRLA, Neck supple, no JVD Lungs: Clear CVS: RRR,No Gallops,Rubs or new Murmurs Abd: Soft, obese, nontender, large ventral hernia, bowel sounds present Extremities: No edema Skin: no new rashes Psychiatry: Poor insight and judgment   Data Reviewed:   CBC: Recent Labs  Lab 06/03/19 0419 06/04/19 0445  06/04/19 1131 06/04/19 1603 06/05/19 0608 06/06/19 0832 06/07/19 0554 06/08/19 0601  WBC 6.4 6.0  --  5.1 4.3 4.4  --  4.5 4.7  NEUTROABS 4.2 3.9  --   --   --   --   --   --   --   HGB 8.4* 9.0*   < > 8.0* 10.0* 8.4* 7.9* 8.2* 8.1*  HCT 26.6* 28.0*   < > 24.6* 30.9* 25.8* 25.1* 25.5* 24.3*  MCV 100.8* 101.4*  --  100.8* 101.6* 101.2*  --  101.2* 97.2  PLT 216 218  --  189 177 193  --  225 179   < > = values in this interval not displayed.   Basic Metabolic Panel: Recent Labs  Lab 06/04/19 0445 06/04/19 0456 06/05/19 0608 06/06/19 0832 06/07/19 0554 06/08/19 0601  NA 135 137 141 140 140 140  K 3.5 3.3* 3.1* 3.3* 3.4* 3.6  CL 100 99 108 110 108 107  CO2 23  --  24 23 23  21*  GLUCOSE 91 88 79 84 86 87  BUN 52* 48* 51* 44* 45* 51*  CREATININE 3.97* 4.00* 3.79* 3.53* 3.54* 3.74*  CALCIUM 9.2  --  8.8* 8.5* 8.9 8.9   GFR: Estimated Creatinine Clearance: 19.4 mL/min (A) (by C-G formula based on SCr of 3.74 mg/dL (H)). Liver Function Tests: Recent Labs  Lab 06/03/19 0419  AST 39  ALT 48*  ALKPHOS 172*  BILITOT 0.7  PROT 7.7  ALBUMIN 4.3   No results for input(s): LIPASE, AMYLASE in the last 168 hours. No results for input(s): AMMONIA in the last 168 hours. Coagulation Profile: Recent Labs  Lab 06/04/19 0445  INR 0.8   Cardiac Enzymes: No results for input(s): CKTOTAL, CKMB, CKMBINDEX, TROPONINI in the last 168 hours. BNP (last 3 results) No results for input(s): PROBNP in the last 8760  hours. HbA1C: No results for input(s): HGBA1C in the last 72 hours. CBG: No results for input(s): GLUCAP in the last 168 hours. Lipid Profile: No results for input(s): CHOL, HDL, LDLCALC, TRIG, CHOLHDL, LDLDIRECT in the last 72 hours. Thyroid Function Tests: No results for input(s): TSH, T4TOTAL, FREET4, T3FREE, THYROIDAB in the last 72 hours. Anemia Panel: Recent Labs    06/07/19 0554  VITAMINB12 419  FOLATE 12.0  FERRITIN 340*  TIBC 267  IRON 87  RETICCTPCT 1.2   Urine analysis:    Component Value Date/Time   COLORURINE RED (A) 06/04/2019 0449   APPEARANCEUR TURBID (A) 06/04/2019 0449   LABSPEC 1.015 06/04/2019 0449   PHURINE 6.5 06/04/2019 0449   GLUCOSEU NEGATIVE 06/04/2019 0449   HGBUR LARGE (A) 06/04/2019 0449   BILIRUBINUR NEGATIVE 06/04/2019 0449   KETONESUR NEGATIVE 06/04/2019 0449   PROTEINUR >300 (A) 06/04/2019 0449   NITRITE POSITIVE (A) 06/04/2019 0449   LEUKOCYTESUR TRACE (A) 06/04/2019 0449   Sepsis Labs: _0 (procalcitonin:4,lacticidven:4)  ) Recent Results (from the past 240 hour(s))  Urine culture     Status: Abnormal   Collection Time: 06/04/19  4:49 AM   Specimen: Urine, Clean Catch  Result Value Ref Range Status   Specimen Description   Final    URINE, CLEAN CATCH Performed at [facility], [place] [address], [place], [place] [address]    Special Requests   Final    NONE Performed at [facility], [place] [address], [place], [place] [address]    Culture (A)  Final    <10,000 COLONIES/mL INSIGNIFICANT GROWTH Performed at [place] [address], [place], [place] [address]    Report Status 06/05/2019 FINAL  Final  SARS CORONAVIRUS 2 (TAT 6-24 HRS)     Status: None   Collection Time: 06/04/19  5:05 AM  Result Value Ref Range Status   SARS Coronavirus 2 NEGATIVE NEGATIVE Final    Comment: (NOTE) SARS-CoV-2 target nucleic acids are NOT DETECTED. The SARS-CoV-2 RNA is generally detectable in  upper and lower respiratory specimens during the acute phase of infection. Negative results do not preclude SARS-CoV-2 infection, do not rule out co-infections with other pathogens, and should not be used as the sole basis for treatment or other patient management decisions. Negative results must be combined with clinical observations, patient history, and epidemiological information. The expected result is Negative. Fact Sheet for Patients: SugarRoll.be Fact Sheet for Healthcare Providers: https://www.woods-mathews.com/ This test is not yet approved or cleared by the [place] FDA and  has been authorized for detection and/or diagnosis of SARS-CoV-2 by FDA under an Emergency Use Authorization (EUA).  This EUA will remain  in effect (meaning this test can be used) for the duration of the COVID-19 declaration under Section 56 4(b)(1) of the Act, 21 U.S.C. section 360bbb-3(b)(1), unless the authorization is terminated or revoked sooner. Performed at [facility], [place] [address] [place], [place] [address]          Radiology Studies: No results found.      Scheduled Meds: . amLODipine  10 mg Oral Daily  . Chlorhexidine Gluconate Cloth  6 each Topical Daily  . finasteride  5 mg Oral Daily  . sodium chloride flush  3 mL Intravenous Q12H   Continuous Infusions:    LOS: 4 days    Time spent: 83mn  PDomenic Polite MD [facility]  06/08/2019, 2:35 PM

## 2019-06-08 NOTE — Consult Note (Signed)
We are following this patient for gross hematuria and left renal mass.  The patient has numerous comorbidities and has been followed by [place] for quite some time in clinic.  When he was last seen, he had refused care and put his health and his treatment in the hands of God.  At this time however the patient has recalculated and opted to pursue aggressive treatment.  The patient has been seen by nephrology and it appears that at least some if not most of his acute kidney dysfunction at this time is related to progression of his multiple myeloma and myeloma kidney.  As such, he may well benefit from initiating chemotherapy which could potentially bring his kidney function back to his baseline.  Complicating things is that he has a large left renal mass.  However, given the location of the mass, it is potentially feasible to perform a partial nephrectomy which would allow him to preserve most of his left kidney.  At the time of surgery, we would approach this as if we were resecting only the mass, and if it became clear during the case that this was not possible we would have to perform a nephrectomy.  The question becomes which is best to treat first, the myeloma kidney or the renal mass.  It may be feasible to administer chemotherapy initially, to help facilitate clearance of the myeloma proteins within his kidney and potentially restore some of his kidney function, allow him to recover and improve his nutrition and then perform a partial nephrectomy.  Once he recovers from this he can then resume treatment for his multiple myeloma.  May be worth consulting oncology for guidance, and to see if this is at all feasible.  If giving him chemotherapy to improve his kidney function does not delay surgery for more than 3 months, I think this is worth pursuing.  The other option would be to proceed to the operating room after he is been cleared by cardiology for a partial nephrectomy the risk of him requiring dialysis  postop, at least temporarily.  In regard to the patient's gross hematuria, I would recommend that he have a voiding trial in the morning given that his bleeding has stopped.  He can then resume clean intermittent catheterization as he had done prior to prevent him from developing postobstructive renal failure.  I will plan to follow-up with the patient as an outpatient in the coming weeks to help plan his surgery.     I spent 35 minutes with this patient face-to-face including speaking to the patient's son.

## 2019-06-08 NOTE — Progress Notes (Signed)
Attempted to call Pts son [name], to give an update on pts status. Voicemail and phone number left for son to call back when able.

## 2019-06-08 NOTE — Progress Notes (Signed)
[name] Progress Note    Assessment/ Plan:    1. AKI on CKD3 - baseline creatinine appears to be 1.5-1.6 as of 11/2018.  AKI pk creat 4.0 > 3.7 > 3.5--> 3.5.  No hydro on imaging and lack of sig improvement is concerning, could have myeloma kidney w/ hx of untreated myeloma. His serum light chain ratio is overwhelmingly kappa- predominant at 8761.  He has 5 g proteinuria on UP/C.  I've ordered a 24 hr UPEP which will give [place] an idea if there is Bence-Jones protein in urine.  That would be a more compelling reason to treat MM if pt willing d/t likely myeloma kidney; would expect improvement in creatinine with treatment and could possibly delay him needing to go on dialysis.  In regards to his RCC, he would likely become dialysis dependent at least in the short term if he were to need a nephrectomy before myeloma could be treated.  Fortunately there is indication for RRT right now.  Overall, this is a complex decision-making process and the pt's history of nonadherence adds a confounding factor to all of this.  I am happy to follow him as an outpatient for CKD care.  I greatly appreciate palliative care assistance.    2. Hematuria - trauma from attempted self-cath, hx BPH, improving. Foley in place, urology following.  Per pt, he has an outpt followup appointment with them this week.  3. Renal mass - suspected RCC  4. HTN - on amlodipine  5. COPD   6. Multiple myeloma - diagnosed late 2019, took chemoRx for a short while and hasn't been back to ONC, lost to f/u. Previously followed with Dr. Irene Limbo  7. Dispo: pending appropriate outpatient plan.  D/w primary team.    Subjective:    Still fixated on the foley catheter.  Wants to have it removed before he can go home.  Still expressing the desire to have all his medical problems treated.  Creatinine seems to have leveled off at 3.5-3.7.   Objective:   BP (!) 147/92 (BP Location: Left Arm)   Pulse 68   Temp (!) 97.4 F (36.3  C) (Oral)   Resp 18   Ht 5' 7.01" (1.702 m)   Wt 102 kg   SpO2 100%   BMI 35.21 kg/m   Intake/Output Summary (Last 24 hours) at 06/08/2019 1455 Last data filed at 06/08/2019 0915 Gross per 24 hour  Intake 1320 ml  Output 700 ml  Net 620 ml   Weight change:   Physical Exam: Gen: older gentleman, NAD CVS: RRR no m/r/g Resp: clear bilaterally no c/w/r Abd: soft, nondistended, bladder not palpable GU: + Foley in place, draining yellow urine, clearing Ext: trace LE edema, some hypopigmented areas around ankles at prior site of ulcers  Imaging: No results found.  Labs: BMET Recent Labs  Lab 06/03/19 0419 06/04/19 0445 06/04/19 0456 06/05/19 0608 06/06/19 0832 06/07/19 0554 06/08/19 0601  NA 138 135 137 141 140 140 140  K 3.3* 3.5 3.3* 3.1* 3.3* 3.4* 3.6  CL 103 100 99 108 110 108 107  CO2 24 23  --  24 23 23  21*  GLUCOSE 97 91 88 79 84 86 87  BUN 53* 52* 48* 51* 44* 45* 51*  CREATININE 3.83* 3.97* 4.00* 3.79* 3.53* 3.54* 3.74*  CALCIUM 9.4 9.2  --  8.8* 8.5* 8.9 8.9   CBC Recent Labs  Lab 06/03/19 0419 06/04/19 0445  06/04/19 1603 06/05/19 8115 06/06/19 7262 06/07/19 0355  06/08/19 0601  WBC 6.4 6.0   < > 4.3 4.4  --  4.5 4.7  NEUTROABS 4.2 3.9  --   --   --   --   --   --   HGB 8.4* 9.0*   < > 10.0* 8.4* 7.9* 8.2* 8.1*  HCT 26.6* 28.0*   < > 30.9* 25.8* 25.1* 25.5* 24.3*  MCV 100.8* 101.4*   < > 101.6* 101.2*  --  101.2* 97.2  PLT 216 218   < > 177 193  --  225 179   < > = values in this interval not displayed.    Medications:    . amLODipine  10 mg Oral Daily  . Chlorhexidine Gluconate Cloth  6 each Topical Daily  . finasteride  5 mg Oral Daily  . sodium chloride flush  3 mL Intravenous Q12H      Madelon Lips, MD 06/08/2019, 2:55 PM

## 2019-06-09 ENCOUNTER — Encounter (HOSPITAL_COMMUNITY): Payer: Self-pay | Admitting: Oncology

## 2019-06-09 LAB — UPEP/UIFE/LIGHT CHAINS/TP, 24-HR UR
% BETA, Urine: 8.5 %
ALPHA 1 URINE: 1.6 %
Albumin, U: 22.1 %
Alpha 2, Urine: 4.8 %
Free Lambda Lt Chains,Ur: 14.8 mg/L — ABNORMAL HIGH (ref 0.47–11.77)
GAMMA GLOBULIN URINE: 63 %
M-SPIKE %, Urine: 58.9 % — ABNORMAL HIGH
M-Spike, Mg/24 Hr: 4184 mg/24 hr — ABNORMAL HIGH
Total Protein, Urine-Ur/day: 7104 mg/24 hr — ABNORMAL HIGH (ref 30–150)
Total Protein, Urine: 326.6 mg/dL
Total Volume: 2175

## 2019-06-09 LAB — URIC ACID: Uric Acid, Serum: 10.4 mg/dL — ABNORMAL HIGH (ref 3.7–8.6)

## 2019-06-09 LAB — RENAL FUNCTION PANEL
Albumin: 3.7 g/dL (ref 3.5–5.0)
Anion gap: 14 (ref 5–15)
BUN: 56 mg/dL — ABNORMAL HIGH (ref 8–23)
CO2: 21 mmol/L — ABNORMAL LOW (ref 22–32)
Calcium: 9 mg/dL (ref 8.9–10.3)
Chloride: 105 mmol/L (ref 98–111)
Creatinine, Ser: 4.17 mg/dL — ABNORMAL HIGH (ref 0.61–1.24)
GFR calc Af Amer: 15 mL/min — ABNORMAL LOW (ref >60)
GFR calc non Af Amer: 13 mL/min — ABNORMAL LOW (ref >60)
Glucose, Bld: 89 mg/dL (ref 70–99)
Phosphorus: 4.9 mg/dL — ABNORMAL HIGH (ref 2.5–4.6)
Potassium: 3.3 mmol/L — ABNORMAL LOW (ref 3.5–5.1)
Sodium: 140 mmol/L (ref 135–145)

## 2019-06-09 MED ORDER — POTASSIUM CHLORIDE 20 MEQ PO PACK
40.0000 meq | PACK | Freq: Once | ORAL | Status: AC
  Administered 2019-06-09: 40 meq via ORAL
  Filled 2019-06-09: qty 2

## 2019-06-09 MED ORDER — FINASTERIDE 5 MG PO TABS: 5.0000 mg | ORAL_TABLET | Freq: Every day | ORAL | 1 refills | Status: DC

## 2019-06-09 NOTE — Progress Notes (Signed)
Pts IV removed with a clean and dry dressing intact. Pt denies pain at the time of d/c with no s/s of distress noted. Pt educated on d/c instructions, follow up medications, and follow up appointments, this RN answered all pts questions at the time. Pt taken to front entrance via wheelchair with nursing staff present.

## 2019-06-09 NOTE — Discharge Summary (Signed)
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 He came back to the emergency room 10/2 evening he was attempting to self-cath and developed acute hematuria and came to ER and subsequentlyadmitted. Creat was 4.0. Prior to that creat at baseline is around 1.6.  -Of note patient stopped follow-up at the cancer center9 to 10 months ago, he also did not follow-up with urology.Palliative care discussions in the past have angered and irritated the patient and discussions were unsuccessful, attempts by PCP at contacting family were also unsuccessful. Inpatient PM consult during this admission was obtained , they have recommended outpatient follow up.  Patient evaluated by Urology and nephrology in this admission. Urology recommending Oncology evaluation to determent the best sequence and timing of partial nephrectomy /MM Rx. Per my discussion with Oncology NP Miss Mena Pauls, patient can be discharged and will need to see them in their clinic on 10/21 for evaluation for Myeloma Rx/chemotherapy. Per nephrology he may end up needing HD but compliance might be an issue.  -Foley  catheter placed while here, hematuria resolved, creatinine stable in 3.7-4 range. Ok per Nephrology to discharge. Labs today show mild hypokalemia, gave 1 dose of Kcl prior to discharge. Patient reports that he usually straight caths only once at night and does have overflow incontinence during the day. Advised to straight cath couple of times a day, cleared for discharge by all consultants and patient verbalized outpatient f/u plans. Eager to go home. Indwelling Foley discontinued.  Resumed other prior home meds.   Discharge Exam:  Vitals:   06/08/19 2004 06/09/19 0507  BP: (!) 142/93 (!) 148/91  Pulse: (!) 59 (!) 53  Resp: 20 20  Temp: 97.9 F (36.6 C) 99 F (37.2 C)  SpO2: 99% 100%   Vitals:   06/08/19 1356 06/08/19 2004 06/09/19 0500 06/09/19 0507  BP: (!) 147/92 (!) 142/93  (!) 148/91  Pulse: 68 (!) 59  (!) 53  Resp: 18 20  20   Temp: (!) 97.4 F (36.3 C) 97.9 F (36.6 C)  99 F (37.2 C)  TempSrc: Oral     SpO2: 100% 99%  100%  Weight:   99.1 kg   Height:        General: Pt is alert, awake, not in acute distress Cardiovascular: RRR, S1/S2 +, no rubs, no gallops Respiratory: CTA bilaterally, no wheezing, no rhonchi Abdominal: Soft, NT, ND, bowel sounds + Extremities: no edema, no cyanosis  Discharge Condition:Stable CODE STATUS: Full  Diet recommendation: low salt heart healthy Recommendations for Outpatient Follow-up:  1. Follow up with PCP: 1 week 2. Follow up with consultants: Oncology on 10/21, Urology as scheduled, Nephrology in 10 days, Palliative care in 2-3 weeks 3. Please obtain follow up labs including: BMP  in 1 week     Discharge Instructions:  Discharge Instructions    Call MD for:  extreme fatigue   Complete by: As directed    Call MD for:  persistant dizziness or light-headedness   Complete by: As directed    Call MD for:  persistant nausea and vomiting   Complete by: As directed    Call MD for:  severe uncontrolled pain   Complete by: As  directed    Call MD for:  temperature >100.4   Complete by: As directed    Diet - low sodium heart healthy   Complete by: As directed    Increase activity slowly   Complete by: As directed      Allergies as of 06/09/2019      Reactions   Amoxicillin Other (See Comments)   Tolerates Unasyn. Can't move Has patient had a PCN reaction causing immediate rash, facial/tongue/throat swelling, SOB or lightheadedness with hypotension: No Has patient had a PCN reaction causing severe rash involving mucus membranes or skin necrosis: No Has patient had a PCN reaction that required hospitalization No Has patient had a PCN reaction occurring within the last 10 years: No If all of the above answers are "NO", then may proceed with Cephalosporin use.   Penicillins Other (See Comments)   Tolerates Unasyn. Can't move/dizziness Has patient had a PCN reaction causing immediate rash, facial/tongue/throat swelling, SOB or lightheadedness with hypotension: No Has patient had a PCN reaction causing severe rash involving mucus membranes or skin necrosis: No Has patient had a PCN reaction that required hospitalization No Has patient had a PCN reaction occurring within the last 10 years: No If all of the above answers are "NO", then may proceed with Cephalosporin use. Other reaction(s): O      Medication List    STOP taking these medications   ferrous sulfate 325 (65 FE) MG tablet Commonly known as: FerrouSul   HYDROcodone-acetaminophen 5-325 MG tablet Commonly known as: NORCO/VICODIN     TAKE these medications   amLODipine 10 MG tablet Commonly known as: NORVASC Take 1 tablet (10 mg total) by mouth daily.   atorvastatin 80 MG tablet Commonly known as: LIPITOR Take 1 tablet (80 mg total) by mouth daily.   cetirizine 10 MG tablet Commonly known as: ZYRTEC Take 10 mg by mouth daily.   cholecalciferol 25 MCG (1000 UT) tablet Commonly known as: VITAMIN D3 Take 1,000 Units by mouth daily.    finasteride 5 MG tablet Commonly known as: PROSCAR Take 1 tablet (5 mg total) by mouth daily. Start taking on: June 10, 2019   fluticasone 50 MCG/ACT nasal spray Commonly known as: FLONASE Place 2 sprays into both nostrils daily.   glucose blood test strip Check blood sugar twice per day. E.11.9       Allergies  Allergen Reactions  . Amoxicillin Other (See Comments)    Tolerates Unasyn. Can't move Has patient had a PCN reaction causing immediate rash, facial/tongue/throat swelling, SOB or lightheadedness with hypotension: No Has patient had a PCN reaction causing severe rash involving mucus membranes or skin necrosis: No Has patient had a PCN reaction that required hospitalization No Has patient had a PCN reaction occurring within the last 10 years: No If all of the above answers are "NO", then may proceed with Cephalosporin use.   [name] Penicillins Other (See Comments)    Tolerates Unasyn. Can't move/dizziness Has patient had a PCN reaction causing immediate rash, facial/tongue/throat swelling, SOB or lightheadedness with hypotension: No  Has patient had a PCN reaction causing severe rash involving mucus membranes or skin necrosis: No Has patient had a PCN reaction that required hospitalization No Has patient had a PCN reaction occurring within the last 10 years: No If all of the above answers are "NO", then may proceed with Cephalosporin use.  Other reaction(s): O      The results of significant diagnostics from this hospitalization (including imaging, microbiology, ancillary and laboratory) are listed below for reference.    Labs: BNP (last 3 results) Recent Labs    09/09/18 1834  BNP 60.7   Basic Metabolic Panel: Recent Labs  Lab 06/05/19 0608 06/06/19 0832 06/07/19 0554 06/08/19 0601 06/09/19 0521  NA 141 140 140 140 140  K 3.1* 3.3* 3.4* 3.6 3.3*  CL 108 110 108 107 105  CO2 24 23 23  21* 21*  GLUCOSE 79 84 86 87 89  BUN 51* 44* 45* 51* 56*  CREATININE  3.79* 3.53* 3.54* 3.74* 4.17*  CALCIUM 8.8* 8.5* 8.9 8.9 9.0  PHOS  --   --   --   --  4.9*   Liver Function Tests: Recent Labs  Lab 06/03/19 0419 06/09/19 0521  AST 39  --   ALT 48*  --   ALKPHOS 172*  --   BILITOT 0.7  --   PROT 7.7  --   ALBUMIN 4.3 3.7   No results for input(s): LIPASE, AMYLASE in the last 168 hours. No results for input(s): AMMONIA in the last 168 hours. CBC: Recent Labs  Lab 06/03/19 0419 06/04/19 0445  06/04/19 1131 06/04/19 1603 06/05/19 0608 06/06/19 0832 06/07/19 0554 06/08/19 0601  WBC 6.4 6.0  --  5.1 4.3 4.4  --  4.5 4.7  NEUTROABS 4.2 3.9  --   --   --   --   --   --   --   HGB 8.4* 9.0*   < > 8.0* 10.0* 8.4* 7.9* 8.2* 8.1*  HCT 26.6* 28.0*   < > 24.6* 30.9* 25.8* 25.1* 25.5* 24.3*  MCV 100.8* 101.4*  --  100.8* 101.6* 101.2*  --  101.2* 97.2  PLT 216 218  --  189 177 193  --  225 179   < > = values in this interval not displayed.   Cardiac Enzymes: No results for input(s): CKTOTAL, CKMB, CKMBINDEX, TROPONINI in the last 168 hours. BNP: Invalid input(s): POCBNP CBG: No results for input(s): GLUCAP in the last 168 hours. D-Dimer No results for input(s): DDIMER in the last 72 hours. Hgb A1c No results for input(s): HGBA1C in the last 72 hours. Lipid Profile No results for input(s): CHOL, HDL, LDLCALC, TRIG, CHOLHDL, LDLDIRECT in the last 72 hours. Thyroid function studies No results for input(s): TSH, T4TOTAL, T3FREE, THYROIDAB in the last 72 hours.  Invalid input(s): FREET3 Anemia work up Recent Labs    06/07/19 0554  VITAMINB12 419  FOLATE 12.0  FERRITIN 340*  TIBC 267  IRON 87  RETICCTPCT 1.2   Urinalysis    Component Value Date/Time   COLORURINE RED (A) 06/04/2019 0449   APPEARANCEUR TURBID (A) 06/04/2019 0449   LABSPEC 1.015 06/04/2019 0449   PHURINE 6.5 06/04/2019 0449   GLUCOSEU NEGATIVE 06/04/2019 0449   HGBUR LARGE (A) 06/04/2019 0449   BILIRUBINUR NEGATIVE 06/04/2019 0449   KETONESUR NEGATIVE 06/04/2019  0449   PROTEINUR >300 (A) 06/04/2019 0449   NITRITE POSITIVE (A) 06/04/2019 0449   LEUKOCYTESUR TRACE (A) 06/04/2019 0449   Sepsis Labs Invalid input(s): PROCALCITONIN,  WBC,  LACTICIDVEN Microbiology Recent Results (from the past 240 hour(s))  Urine culture     Status: Abnormal   Collection Time: 06/04/19  4:49 AM   Specimen: Urine, Clean Catch  Result Value Ref Range Status   Specimen Description   Final    URINE, CLEAN CATCH Performed at [facility], [place] [address] [place], [place] [address]    Special Requests   Final    NONE Performed at [facility], [place] [address], [place], [place] [address]    Culture (A)  Final    <10,000 COLONIES/mL INSIGNIFICANT GROWTH Performed at [place] [address] [place], [place] [address]    Report Status 06/05/2019 FINAL  Final  SARS CORONAVIRUS 2 (TAT 6-24 HRS)     Status: None   Collection Time: 06/04/19  5:05 AM  Result Value Ref Range Status   SARS Coronavirus 2 NEGATIVE NEGATIVE Final    Comment: (NOTE) SARS-CoV-2 target nucleic acids are NOT DETECTED. The SARS-CoV-2 RNA is generally detectable in upper and lower respiratory specimens during the acute phase of infection. Negative results do not preclude SARS-CoV-2 infection, do not rule out co-infections with other pathogens, and should not be used as the sole basis for treatment or other patient management decisions. Negative results must be combined with clinical observations, patient history, and epidemiological information. The expected result is Negative. Fact Sheet for Patients: SugarRoll.be Fact Sheet for Healthcare Providers: https://www.woods-mathews.com/ This test is not yet approved or cleared by the [place] FDA and  has been authorized for detection and/or diagnosis of SARS-CoV-2 by FDA under an Emergency Use Authorization (EUA). This EUA will remain  in effect  (meaning this test can be used) for the duration of the COVID-19 declaration under Section 56 4(b)(1) of the Act, 21 U.S.C. section 360bbb-3(b)(1), unless the authorization is terminated or revoked sooner. Performed at [facility], [place] [address], [place], [place] [address]     Procedures/Studies: Ct Abdomen Pelvis Wo Contrast  Result Date: 06/01/2019 CLINICAL DATA:  Status post partial right colectomy 05/21/2018. Incisional hernia evaluation. Additional history of prostate cancer. EXAM: CT ABDOMEN AND PELVIS WITHOUT CONTRAST TECHNIQUE: Multidetector CT imaging of the abdomen and pelvis was performed following the standard protocol without IV contrast. Creatinine was obtained on site at [place] at [address] Results: Creatinine 4.1 mg/dL. COMPARISON:  08/19/2018 PET-CT.  06/05/2018 CT abdomen/pelvis. FINDINGS: Lower chest: No significant pulmonary nodules or acute consolidative airspace disease. Cardiomegaly. Coronary atherosclerosis. Hepatobiliary: Normal liver size. Simple 1.0 cm right liver dome cyst. Several subcentimeter hypodense liver lesions scattered throughout the liver, too small to characterize, not appreciably changed since 06/05/2018 CT, probably benign. No appreciable new liver lesions. Normal gallbladder with no radiopaque cholelithiasis. No biliary ductal dilatation. Pancreas: Normal, with no mass or duct dilation. Spleen: Normal size. No mass. Adrenals/Urinary Tract: Normal adrenals. No renal stones. No hydronephrosis. Dominant 7.2 x 6.2 cm posterior upper left renal cortical mass with density 29 HU (series 2/image 23), increased from 4.6 x 4.8 cm on 06/05/2018 CT. Mixed attenuation 5.1 x 4.2 cm renal cortical mass in the medial lower right kidney (series 2/image 37), previously 5.0 x 4.0 cm on 06/05/2018 CT, not significantly changed. Numerous additional hyperdense renal cortical masses in both kidneys, largest 3.5 cm in the interpolar right kidney (series  2/image 30), increased from 2.8 cm. Several simple renal cysts scattered in both kidneys, largest 4.2 cm in medial interpolar left kidney. Chronic  mild diffuse bladder wall thickening is unchanged. No significant bladder distention. No bladder stones. Stomach/Bowel: Normal non-distended stomach. Status post subtotal right hemicolectomy with ileocolic anastomosis in the right abdomen. No small bowel dilatation or wall thickening. Oral contrast transits to the rectum. Mild scattered diverticulosis throughout the remnant large-bowel, with no definite large bowel wall thickening or acute pericolonic fat stranding. There is prominent diastasis of the midline supraumbilical ventral abdominal wall (series 2/image 27) containing a portion of the transverse colon. There is a tiny umbilical hernia containing a portion of a small bowel loop. Vascular/Lymphatic: Atherosclerotic abdominal aorta with ectatic 2.8 cm infrarenal abdominal aorta. 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 No pathologically enlarged lymph nodes in the abdomen or pelvis. Reproductive: Markedly enlarged prostate with mass-effect on the bladder base with nonspecific internal prostatic calcifications, not appreciably changed. Other: No pneumoperitoneum, ascites or focal fluid collection. Musculoskeletal: No aggressive appearing focal osseous lesions. Marked thoracolumbar spondylosis. IMPRESSION: 1. Significant interval growth of 7.2 cm posterior upper left renal cortical mass, which was hypermetabolic on 04/59/9774 PET-CT, and which was enhancing on 03/24/2018 CT abdomen/pelvis. 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 Urology consultation advised. 2. Numerous additional complex hyperdense bilateral renal masses are incompletely evaluated on this noncontrast study. Additional renal cell carcinomas cannot be excluded. MRI abdomen without and with IV contrast is indicated for further  evaluation. 3. Prominent diastasis of the midline supraumbilical ventral abdominal wall, containing a portion of the transverse colon. 4. Separate tiny umbilical hernia contains a portion of a small bowel loop. No acute bowel complication. 5. Cardiomegaly.  Coronary atherosclerosis. 6. Chronic mild diffuse bladder wall thickening, probably due to chronic bladder outlet obstruction by the markedly enlarged prostate. No hydronephrosis. 7. 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 Recommend follow-up aortic ultrasound in 5 years. This recommendation follows ACR consensus guidelines: White Paper of the ACR Incidental Findings Committee II on Vascular Findings. J Am Coll Radiol 2013; 10:789-794. 8.  Aortic Atherosclerosis (ICD10-I70.0). These results will be called to the ordering clinician or representative by the Radiologist Assistant, and communication documented in the PACS or zVision Dashboard. Patient's acute renal failure as detected by creatinine testing prior to the scan was called by the CT technologist to triage RN Abigail Butts in the provider's office. Electronically Signed   By: Ilona Sorrel M.D.   On: 06/01/2019 14:00   US Renal  Result Date: 06/04/2019 CLINICAL DATA:  Acute kidney injury. EXAM: RENAL / URINARY TRACT ULTRASOUND COMPLETE COMPARISON:  Abdominopelvic CT 06/01/2019. Renal ultrasound 06/06/2018. PET-CT 08/19/2018. FINDINGS: Right Kidney: Renal measurements: 13.0 x 5.6 x 4.9 cm = volume: 185.1 mL. There is a simple appearing cystic lesion in the upper interpolar region measuring 3.0 x 2.6 x 2.7 cm. In the lower pole, there is a mildly septated cystic lesion measuring 4.2 x 3.3 x 3.7 cm. Numerous other smaller cystic lesions are present. No hydronephrosis. Left Kidney: Renal measurements: 10.5 x 6.2 x 4.4 cm = volume: 151 mL. The known solid mass in the upper pole which was hypermetabolic on previous PET-CT shows continued enlargement as seen on earlier CT. This now  measures 7.3 x 6.0 x 8.2 cm (4.9 x 4.5 x 4.5 cm on previous ultrasound of 1 year ago). Color Doppler analysis of this lesion is suboptimal. There are additional cystic lesions in the interpolar region, measuring up to 4.2 and 3.5 cm respectively. No hydronephrosis. Bladder: Median lobe prostate hypertrophy protrudes into the  bladder lumen as seen on previous studies. There is a separate filling defect which was incompletely evaluated by this examination, probably reflecting blood clot. Patient was unable to complete the examination, needing to void. IMPRESSION: 1. Known solid mass involving the upper pole of the left kidney shows continued enlargement as seen on earlier CT scan. This remains consistent with neoplasm, likely renal cell carcinoma. Please refer to recommendations on CT scan performed 06/01/2019. 2. Additional simple and complex cystic lesions bilaterally. No hydronephrosis. 3. Filling defect in the urinary bladder lumen adjacent to an enlarged median lobe, probably blood clot. This was incompletely evaluated by this examination. Electronically Signed   By: Richardean Sale M.D.   On: 06/04/2019 11:38   Dg Foot 2 Views Left  Result Date: 06/03/2019 CLINICAL DATA:  One week of great toe swelling and pain EXAM: LEFT FOOT - 2 VIEW COMPARISON:  None. FINDINGS: Soft tissue swelling medial to the first MTP joint with soft tissue calcification. Mild hallux valgus. No definite juxta articular or marginal erosion. IMPRESSION: Swelling and soft tissue calcification at the first MTP joint, suspect gout. Electronically Signed   By: Monte Fantasia M.D.   On: 06/03/2019 05:40     Time coordinating discharge: Over 30 minutes  SIGNED:   Guilford Shi, MD  [facility] 06/09/2019, 1:21 PM Pager : [phone]

## 2019-06-09 NOTE — Progress Notes (Signed)
Discharge plans noted.  I am arranging followup with me in clinic in 2-3 weeks. I have communicated with my scheduler and they will contact him with an appointment date and time.   Letter will also be mailed to pt with the details as well.    Madelon Lips MD [facility] pgr [phone]

## 2019-06-09 NOTE — Progress Notes (Signed)
Brief Oncology Note:  I stopped by to see [name] [name]. He plans to discharge to home later today. Received message from Urology and hospitalist that the patient would like to pursue aggressive treatment for his multiple myeloma and renal mass. The patient has declined treatment in the past and has not kept follow-up appointments at the [facility]. He has been offered Hospice in the past, but has declined.  We have arranged for outpatient follow up at the [facility] on 06/23/2019 at 145 pm to discuss treatment of his multiple myeloma. The patient was informed of this appointment and states that he will be there.   Mikey Bussing, DNP, AGPCNP-BC, AOCNP

## 2019-06-17 NOTE — Progress Notes (Signed)
Cardiology Office Note   Date:  06/18/2019   ID:  [name] [name], DOB 1946/05/13, MRN [id number]  PCP:  Scot Jun, FNP  Cardiologist:   Minus Breeding, MD   Chief Complaint  Patient presents with  . Aortic Insuffiency      History of Present Illness: [name] [name] is a 73 y.o. male who presents for evaluation of palpitations and CAD.  He was in the hospital earlier this month and I reviewed these records for this visit.  He had acute on chronic renal insufficiency.  He was being managed for hematuria and was having trouble with urination.  As I reviewed these records it is noted that the patient has refused management recently for multiple myeloma and renal cell cancer.  I do notice that his creatinine is elevated but stable.  His potassium was a little bit low but it was supplemented.  He had an abdominal hernia that he wanted managed.  Today when he comes in surprisingly for this appointment he is complaining only about this.  He says it causes him to have some abdominal discomfort.  He wants to get that taken care of so he says he will comply with going to the cancer center and he has an appointment next week.  He does describe occasional palpitations.  He feels his heart flipping but it is not sustained.  He does not have any presyncope or syncope.  He really does not have any new shortness of breath, PND or orthopnea.  He has had no chest discomfort, neck or arm discomfort  Past Medical History:  Diagnosis Date  . Abnormal CT of liver    a. nodular contour suggesting cirrhosis 05/2018.  [name] Abnormal LFTs (liver function tests)   . Anxiety   . Aortic root dilatation ([place])   . Ascending aortic aneurysm ([place])   . BPH with urinary obstruction   . Bradycardia   . 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 CAD (coronary artery disease)    a. 12/2015 NSTEMI/Cath (in setting of PAF):  LM nl, LAD 30p,  LCX  7m RCA  ok, AM 100%, RPDA1 40, RPDA 2 60 ->Med Rx.  . Childhood asthma   . Chronic diastolic CHF (congestive heart failure) ([place]    a. 12/2015 Echo: EF 50-55%, mod AI, mod Ao root dil, mild MR, mod dil LA, mod RA.  . CKD (chronic kidney disease), stage III   . Colon cancer ([place]   . COPD (chronic obstructive pulmonary disease) ([place]    pt denies at preop  . Diabetes mellitus type II    diet controlled  . History of syncope   . Hyperlipidemia   . Hypertensive heart disease   . Kidney lump 04/04/2010   Overview:  Renal Cell Carcinoma   . 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 Moderate aortic insufficiency    a. 12/2015 Echo: Mod AI.  .Marland KitchenParoxysmal atrial fibrillation ([place]    a. 12/2015 started on Xarelto (CHA2DS2VASc = 4-5).  . Pneumonia   . Prostate cancer ([place]   . Renal cell carcinoma ([place]   . Sleep apnea    Does not like  CPAP  . Spinal stenosis in cervical region 10/12/2016  . Venous insufficiency     Past Surgical History:  Procedure Laterality Date  . BACK SURGERY    . CARDIAC CATHETERIZATION N/A 12/18/2015   Procedure: Left Heart Cath  and Coronary Angiography;  Surgeon: Leonie Man, MD;  Location: [place] [facility];  Service: Cardiovascular;  Laterality: N/A;  . DIAGNOSTIC LAPAROSCOPY     partial colectomy  . LAPAROSCOPIC PARTIAL COLECTOMY Right 05/21/2018   Procedure: LAPAROSCOPIC RIGHT  COLECTOMY ERAS PATHWAY;  Surgeon: Leighton Ruff, MD;  Location: [facility];  Service: General;  Laterality: Right;  . [place]   "replaced a disc"     Current Outpatient Medications  Medication Sig Dispense Refill  . amLODipine (NORVASC) 10 MG tablet Take 1 tablet (10 mg total) by mouth daily. 90 tablet 0  . atorvastatin (LIPITOR) 80 MG tablet Take 1 tablet (80 mg total) by mouth daily. 90 tablet 0  . cetirizine (ZYRTEC) 10 MG tablet Take 10 mg by mouth daily.    . cholecalciferol (VITAMIN D3) 25 MCG (1000 UT) tablet Take 1,000 Units by mouth daily.    . finasteride  (PROSCAR) 5 MG tablet Take 1 tablet (5 mg total) by mouth daily. 30 tablet 1  . fluticasone (FLONASE) 50 MCG/ACT nasal spray Place 2 sprays into both nostrils daily.    [name] glucose blood test strip Check blood sugar twice per day. E.11.9 100 each 12   No current facility-administered medications for this visit.     Allergies:   Amoxicillin and Penicillins    ROS:  Please see the history of present illness.   Otherwise, review of systems are positive for none.   All other systems are reviewed and negative.    PHYSICAL EXAM: VS:  BP 132/75   Pulse 70   Temp (!) 97.1 F (36.2 C)   Ht 5' 7"  (1.702 m)   Wt 218 lb (98.9 kg)   SpO2 98%   BMI 34.14 kg/m  , BMI Body mass index is 34.14 kg/m. GENERAL:  Well appearing NECK:  No jugular venous distention, waveform within normal limits, carotid upstroke brisk and symmetric, no bruits, no thyromegaly LYMPHATICS:  No cervical, inguinal adenopathy LUNGS:  Clear to auscultation bilaterally CHEST:  Unremarkable HEART:  PMI not displaced or sustained,S1 and S2 within normal limits, no S3, no S4, no clicks, no rubs, 2 out of 6 brief apical systolic murmur, 3/6 diastolic murmur heard best at the third left intercostal space murmurs ABD:  Flat, positive bowel sounds normal in frequency in pitch, no bruits, no rebound, no guarding, no midline pulsatile mass, no hepatomegaly, no splenomegaly EXT:  2 plus pulses throughout, no edema, no cyanosis no clubbing   EKG:  EKG is not ordered today. The ekg ordered 10-20 20 demonstrates sinus bradycardia, left axis deviation, poor anterior R wave progression, nonspecific inferolateral T wave inversions, premature atrial contractions.   Recent Labs: 09/09/2018: B Natriuretic Peptide 36.2 10/22/2018: TSH 2.099 06/03/2019: ALT 48 06/08/2019: Hemoglobin 8.1; Platelets 179 06/09/2019: BUN 56; Creatinine, Ser 4.17; Potassium 3.3; Sodium 140    Lipid Panel    Component Value Date/Time   CHOL 199 09/30/2017 1042    TRIG 109 09/30/2017 1042   HDL 56 09/30/2017 1042   CHOLHDL 3.6 09/30/2017 1042   CHOLHDL 3.3 10/11/2016 1900   VLDL 27 10/11/2016 1900   LDLCALC 121 (H) 09/30/2017 1042      Wt Readings from Last 3 Encounters:  06/18/19 218 lb (98.9 kg)  06/09/19 218 lb 7.6 oz (99.1 kg)  06/03/19 215 lb (97.5 kg)      Other studies Reviewed: Additional studies/ records that were reviewed today include: Hospital records. Review of the above records  demonstrates:  Please see elsewhere in the note.     ASSESSMENT AND PLAN:  Palpitations The patient has premature ectopic complexes and he feels these but they are not particularly problematic.  No change in therapy.   Essential (primary) hypertension Blood pressures well controlled.  Continue the meds as listed.   CAD (coronary artery disease) He had a non-STEMI in April 2017.  Anatomy is as above.  He is not having any ongoing angina.  No change in therapy.   Chronic diastolic CHF (congestive heart failure) ([place]) He seems to be euvolemic.  He will continue the meds as listed.  He is going to have his potassium and he is renal function checked early next week and so I will defer to that and whether he needs any supplemental potassium.   AI He has had aortic insufficiency.  I will check an echocardiogram.  Current medicines are reviewed at length with the patient today.  The patient does not have concerns regarding medicines.  The following changes have been made:  no change  Labs/ tests ordered today include:   Orders Placed This Encounter  Procedures  . ECHOCARDIOGRAM COMPLETE     Disposition:   FU with me in one year.      Signed, Minus Breeding, MD  06/18/2019 2:49 PM    [facility]

## 2019-06-18 ENCOUNTER — Ambulatory Visit (INDEPENDENT_AMBULATORY_CARE_PROVIDER_SITE_OTHER): Payer: Medicare Other | Admitting: Cardiology

## 2019-06-18 ENCOUNTER — Other Ambulatory Visit: Payer: Self-pay

## 2019-06-18 ENCOUNTER — Encounter: Payer: Self-pay | Admitting: Cardiology

## 2019-06-18 VITALS — BP 132/75 | HR 70 | Temp 97.1°F | Ht 67.0 in | Wt 218.0 lb

## 2019-06-18 DIAGNOSIS — R002 Palpitations: Secondary | ICD-10-CM

## 2019-06-18 DIAGNOSIS — I10 Essential (primary) hypertension: Secondary | ICD-10-CM

## 2019-06-18 DIAGNOSIS — J441 Chronic obstructive pulmonary disease with (acute) exacerbation: Secondary | ICD-10-CM

## 2019-06-18 DIAGNOSIS — I351 Nonrheumatic aortic (valve) insufficiency: Secondary | ICD-10-CM

## 2019-06-18 DIAGNOSIS — I2510 Atherosclerotic heart disease of native coronary artery without angina pectoris: Secondary | ICD-10-CM | POA: Diagnosis not present

## 2019-06-18 DIAGNOSIS — I5032 Chronic diastolic (congestive) heart failure: Secondary | ICD-10-CM

## 2019-06-18 NOTE — Patient Instructions (Signed)
Medication Instructions:  The current medical regimen is effective;  continue present plan and medications as directed. Please refer to the Current Medication list given to you today. If you need a refill on your cardiac medications before your next appointment, please call your pharmacy.  Testing/Procedures: Echocardiogram - Your physician has requested that you have an echocardiogram. Echocardiography is a painless test that uses sound waves to create images of your heart. It provides your doctor with information about the size and shape of your heart and how well your heart's chambers and valves are working. This procedure takes approximately one hour. There are no restrictions for this procedure. This will be performed at our [facility] location - [address].  Follow-Up: IN  12 months. Either In Person or Virtual  You may see Minus Breeding, MD or one of the following Advanced Practice Providers on your designated Care Team:  Rosaria Ferries, PA-C Jory Sims, DNP, ANP Cadence Kathlen Mody, NP  Please call our office in advance, AUGUST 2021 to schedule this October 2021 appointment.   At [facility], you and your health needs are our priority.  As part of our continuing mission to provide you with exceptional heart care, we have created designated Provider Care Teams.  These Care Teams include your primary Cardiologist (physician) and Advanced Practice Providers (APPs -  Physician Assistants and Nurse Practitioners) who all work together to provide you with the care you need, when you need it.  Thank you for choosing [facility] at [facility]!!

## 2019-06-23 ENCOUNTER — Inpatient Hospital Stay: Payer: Medicare Other

## 2019-06-23 ENCOUNTER — Inpatient Hospital Stay: Payer: Medicare Other | Attending: Hematology | Admitting: Hematology

## 2019-06-23 ENCOUNTER — Other Ambulatory Visit: Payer: Self-pay

## 2019-06-23 ENCOUNTER — Telehealth: Payer: Self-pay | Admitting: Emergency Medicine

## 2019-06-23 VITALS — BP 133/71 | HR 59 | Temp 99.1°F | Resp 18 | Ht 67.0 in | Wt 211.3 lb

## 2019-06-23 DIAGNOSIS — C9000 Multiple myeloma not having achieved remission: Secondary | ICD-10-CM

## 2019-06-23 DIAGNOSIS — N179 Acute kidney failure, unspecified: Secondary | ICD-10-CM | POA: Diagnosis not present

## 2019-06-23 DIAGNOSIS — C649 Malignant neoplasm of unspecified kidney, except renal pelvis: Secondary | ICD-10-CM | POA: Diagnosis not present

## 2019-06-23 DIAGNOSIS — R7989 Other specified abnormal findings of blood chemistry: Secondary | ICD-10-CM | POA: Diagnosis not present

## 2019-06-23 LAB — CMP (CANCER CENTER ONLY)
ALT: 44 U/L (ref 0–44)
AST: 31 U/L (ref 15–41)
Albumin: 3.9 g/dL (ref 3.5–5.0)
Alkaline Phosphatase: 173 U/L — ABNORMAL HIGH (ref 38–126)
Anion gap: 12 (ref 5–15)
BUN: 61 mg/dL — ABNORMAL HIGH (ref 8–23)
CO2: 25 mmol/L (ref 22–32)
Calcium: 9.2 mg/dL (ref 8.9–10.3)
Chloride: 105 mmol/L (ref 98–111)
Creatinine: 4.93 mg/dL (ref 0.61–1.24)
GFR, Est AFR Am: 12 mL/min — ABNORMAL LOW (ref >60)
GFR, Estimated: 11 mL/min — ABNORMAL LOW (ref >60)
Glucose, Bld: 94 mg/dL (ref 70–99)
Potassium: 3.6 mmol/L (ref 3.5–5.1)
Sodium: 142 mmol/L (ref 135–145)
Total Bilirubin: 0.6 mg/dL (ref 0.3–1.2)
Total Protein: 7.4 g/dL (ref 6.5–8.1)

## 2019-06-23 LAB — CBC WITH DIFFERENTIAL (CANCER CENTER ONLY)
Abs Immature Granulocytes: 0.01 10*3/uL (ref 0.00–0.07)
Basophils Absolute: 0 10*3/uL (ref 0.0–0.1)
Basophils Relative: 0 %
Eosinophils Absolute: 0.1 10*3/uL (ref 0.0–0.5)
Eosinophils Relative: 2 %
HCT: 23.5 % — ABNORMAL LOW (ref 39.0–52.0)
Hemoglobin: 7.9 g/dL — ABNORMAL LOW (ref 13.0–17.0)
Immature Granulocytes: 0 %
Lymphocytes Relative: 24 %
Lymphs Abs: 1.3 10*3/uL (ref 0.7–4.0)
MCH: 33.1 pg (ref 26.0–34.0)
MCHC: 33.6 g/dL (ref 30.0–36.0)
MCV: 98.3 fL (ref 80.0–100.0)
Monocytes Absolute: 0.5 10*3/uL (ref 0.1–1.0)
Monocytes Relative: 9 %
Neutro Abs: 3.3 10*3/uL (ref 1.7–7.7)
Neutrophils Relative %: 65 %
Platelet Count: 219 10*3/uL (ref 150–400)
RBC: 2.39 MIL/uL — ABNORMAL LOW (ref 4.22–5.81)
RDW: 12.4 % (ref 11.5–15.5)
WBC Count: 5.2 10*3/uL (ref 4.0–10.5)
nRBC: 0 % (ref 0.0–0.2)

## 2019-06-23 NOTE — Progress Notes (Signed)
HEMATOLOGY/ONCOLOGY CONSULTATION NOTE  Date of Service: 06/23/2019  Patient Care Team: Scot Jun, FNP as PCP - General (Family Medicine) Minus Breeding, MD as PCP - Cardiology (Cardiology)  CHIEF COMPLAINTS/PURPOSE OF CONSULTATION:  Follow up for his Renal Cell carcinoma & MM  HISTORY OF PRESENTING ILLNESS:  [name] [name] is a wonderful 74 y.o. male who has 73 year old man PMH renal cell carcinoma, prostate cancer, multiple myeloma, COPD, CKD stage III PRESENTED to [facility] emergency Department for hematuria beginning after self-catheterization (BPH). Found to have acute kidney injury.   Seen in the emergency department 10/1 for left great toe pain, diagnosed with gout, found to have worsening renal function, admission recommended but patient declined at that time.  Last evening the patient self catheterized and developed acute hematuria and so came to the emergency department for further evaluation where he was found to have gross hematuria.  He denies any bladder pain.  Review of systems is unremarkable.  No COVID symptoms or known exposure.  No specific aggravating or alleviating factors.  He reports that he has follow-up with his urologist on the 16th.  He is aware of his diagnosis of myeloma and suspected kidney cancer but did not seem concerned about this.  He has not been eating very well but drinks a lot of water.  INTERVAL HISTORY:   [name] [name] is a wonderful 73 y.o. male who has 73 year old man who is here for a f/u of his renal cell carcinoma, prostate cancer and multiple myeloma. The patient was last seen on 06/09/2019. The pt reports that he is doing well overall.  The pt reports that he has a hernia that is preventing him from eating and passing his bowels regularly. He is unable to sleep well due to constipation. He is able to have liquid bowel movements but his solid stools are not coming out. Pt reports that his Nephrologist would like for him to begin  his treatment for MM before removing the mass on his kidney. Dr. Hollie Salk, his Nephrologist is planning for a follow-up in 2-3 weeks from 10/07 in clinic. He also has a Dealer appointment in 3 weeks. Pt acknowledges that he abandoned his treatment before when he was doing well and he says that he did so because he was listening to the wrong people. He states that he is ready to begin treatment and will stick with it at this time. His oldest son works in a hospital and has been helping him keep up with his medical conditions and appointments. Pt currently lives by himself and cooks and cleans for himself. He does not currently have home care but has a specific home care helper that he would like to be referred to, as they took care of his deceased wife.   Lab results today (06/23/19) of CBC w/diff and CMP is as follows: all values are WNL except for RBC at 2.39, Hgb at 7.9, HCT at 23.5, BUN at 61, Creatinine at 4.93, Alkaline Phosphatase at 173, GFR Est AFR Am at 12. 10/21/202020 K/L light chains is in progress  06/23/2019 MMP is in progress   On review of systems, pt reports severe constipation, inability to eat and denies new bone pain, lightheadedness/dizziness and any other symptoms.    MEDICAL HISTORY:  Past Medical History:  Diagnosis Date  . Abnormal CT of liver    a. nodular contour suggesting cirrhosis 05/2018.  [name] Abnormal LFTs (liver function tests)   . Anxiety   . Aortic  root dilatation ([place])   . Ascending aortic aneurysm ([place])   . BPH with urinary obstruction   . Bradycardia   . 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 CAD (coronary artery disease)    a. 12/2015 NSTEMI/Cath (in setting of PAF):  LM nl, LAD 30p,  LCX 14m RCA  ok, AM 100%, RPDA1 40, RPDA 2 60 ->Med Rx.  . Childhood asthma   . Chronic diastolic CHF (congestive heart failure) ([place]    a. 12/2015 Echo: EF 50-55%, mod AI, mod Ao root dil,  mild MR, mod dil LA, mod RA.  . CKD (chronic kidney disease), stage III   . Colon cancer ([place]   . COPD (chronic obstructive pulmonary disease) ([place]    pt denies at preop  . Diabetes mellitus type II    diet controlled  . History of syncope   . Hyperlipidemia   . Hypertensive heart disease   . Kidney lump 04/04/2010   Overview:  Renal Cell Carcinoma   . Light chain myeloma ([place]   . Moderate aortic insufficiency    a. 12/2015 Echo: Mod AI.  .Marland KitchenParoxysmal atrial fibrillation ([place]    a. 12/2015 started on Xarelto (CHA2DS2VASc = 4-5).  . Pneumonia   . Prostate cancer ([place]   . Renal cell carcinoma ([place]   . Sleep apnea    Does not like  CPAP  . Spinal stenosis in cervical region 10/12/2016  . Venous insufficiency     SURGICAL HISTORY: Past Surgical History:  Procedure Laterality Date  . BACK SURGERY    . CARDIAC CATHETERIZATION N/A 12/18/2015   Procedure: Left Heart Cath and Coronary Angiography;  Surgeon: DLeonie Man MD;  [facility];  Service: Cardiovascular;  Laterality: N/A;  . DIAGNOSTIC LAPAROSCOPY     partial colectomy  . LAPAROSCOPIC PARTIAL COLECTOMY Right 05/21/2018   Procedure: LAPAROSCOPIC RIGHT  COLECTOMY ERAS PATHWAY;  Surgeon: TLeighton Ruff MD;  Location: [facility];  Service: General;  Laterality: Right;  . LMarlette  "replaced a disc"    SOCIAL HISTORY: Social History   Socioeconomic History  . Marital status: Widowed    Spouse name: Not on file  . Number of children: Not on file  . Years of education: Not on file  . Highest education level: Not on file  Occupational History  . Not on file  Social Needs  . Financial resource strain: Not on file  . Food insecurity    Worry: Not on file    Inability: Not on file  . Transportation needs    Medical: Not on file    Non-medical: Not on file  Tobacco Use  . Smoking status: Former Smoker    Types: Cigars    Quit date: 09/02/1977    Years since quitting: 41.8  .  Smokeless tobacco: Never Used  . Tobacco comment:    Substance and Sexual Activity  . Alcohol use: Not Currently    Comment: "stopped drinking alcohol in ~ 1980; just drank a little on the weekends when I did drink"  . Drug use: No  . Sexual activity: Not Currently  Lifestyle  . Physical activity    Days per week: Not on file    Minutes per session: Not on file  . Stress: Not on file  Relationships  . Social connections    Talks on phone: Not on file  Gets together: Not on file    Attends religious service: Not on file    Active member of club or organization: Not on file    Attends meetings of clubs or organizations: Not on file    Relationship status: Not on file  . Intimate partner violence    Fear of current or ex partner: Not on file    Emotionally abused: Not on file    Physically abused: Not on file    Forced sexual activity: Not on file  Other Topics Concern  . Not on file  Social History Narrative   Lives alone.  Wife died in 2023/03/18.  Lives in apartment.      FAMILY HISTORY: Family History  Problem Relation Age of Onset  . Emphysema Father   . Asthma Father   . Liver disease Father        tumor  . Heart disease Mother   . Hypertension Mother   . Asthma Sister   . Hypertension Son     ALLERGIES:  is allergic to amoxicillin and penicillins.  MEDICATIONS:  Current Outpatient Medications  Medication Sig Dispense Refill  . amLODipine (NORVASC) 10 MG tablet Take 1 tablet (10 mg total) by mouth daily. 90 tablet 0  . atorvastatin (LIPITOR) 80 MG tablet Take 1 tablet (80 mg total) by mouth daily. 90 tablet 0  . cetirizine (ZYRTEC) 10 MG tablet Take 10 mg by mouth daily.    . cholecalciferol (VITAMIN D3) 25 MCG (1000 UT) tablet Take 1,000 Units by mouth daily.    . finasteride (PROSCAR) 5 MG tablet Take 1 tablet (5 mg total) by mouth daily. 30 tablet 1  . fluticasone (FLONASE) 50 MCG/ACT nasal spray Place 2 sprays into both nostrils daily.    [name] glucose blood test  strip Check blood sugar twice per day. E.11.9 100 each 12   No current facility-administered medications for this visit.     REVIEW OF SYSTEMS:    10 Point review of Systems was done is negative except as noted above.  PHYSICAL EXAMINATION: ECOG PERFORMANCE STATUS: 2 - Symptomatic, <50% confined to bed  . Vitals:   06/23/19 1443  BP: 133/71  Pulse: (!) 59  Resp: 18  Temp: 99.1 F (37.3 C)  SpO2: 100%   There were no vitals filed for this visit. .Body mass index is 33.09 kg/m.  GENERAL:alert, in no acute distress and comfortable SKIN: no acute rashes, no significant lesions EYES: conjunctiva are pink and non-injected, sclera anicteric OROPHARYNX: MMM, no exudates, no oropharyngeal erythema or ulceration NECK: supple, no JVD LYMPH:  no palpable lymphadenopathy in the cervical, axillary or inguinal regions LUNGS: clear to auscultation b/l with normal respiratory effort HEART: regular rate & rhythm ABDOMEN:  normoactive bowel sounds , non tender, not distended. Extremity: no pedal edema PSYCH: alert & oriented x 3 with fluent speech NEURO: no focal motor/sensory deficits  LABORATORY DATA:  I have reviewed the data as listed  . CBC Latest Ref Rng & Units 06/08/2019 06/07/2019 06/06/2019  WBC 4.0 - 10.5 K/uL 4.7 4.5 -  Hemoglobin 13.0 - 17.0 g/dL 8.1(L) 8.2(L) 7.9(L)  Hematocrit 39.0 - 52.0 % 24.3(L) 25.5(L) 25.1(L)  Platelets 150 - 400 K/uL 179 225 -    . CMP Latest Ref Rng & Units 06/09/2019 06/08/2019 06/07/2019  Glucose 70 - 99 mg/dL 89 87 86  BUN 8 - 23 mg/dL 56(H) 51(H) 45(H)  Creatinine 0.61 - 1.24 mg/dL 4.17(H) 3.74(H) 3.54(H)  Sodium 135 - 145 mmol/L  140 140 140  Potassium 3.5 - 5.1 mmol/L 3.3(L) 3.6 3.4(L)  Chloride 98 - 111 mmol/L 105 107 108  CO2 22 - 32 mmol/L 21(L) 21(L) 23  Calcium 8.9 - 10.3 mg/dL 9.0 8.9 8.9  Total Protein 6.5 - 8.1 g/dL - - -  Total Bilirubin 0.3 - 1.2 mg/dL - - -  Alkaline Phos 38 - 126 U/L - - -  AST 15 - 41 U/L - - -  ALT 0 -  44 U/L - - -     RADIOGRAPHIC STUDIES: I have personally reviewed the radiological images as listed and agreed with the findings in the report. Ct Abdomen Pelvis Wo Contrast  Result Date: 06/01/2019 CLINICAL DATA:  Status post partial right colectomy 05/21/2018. Incisional hernia evaluation. Additional history of prostate cancer. EXAM: CT ABDOMEN AND PELVIS WITHOUT CONTRAST TECHNIQUE: Multidetector CT imaging of the abdomen and pelvis was performed following the standard protocol without IV contrast. Creatinine was obtained on site at [facility] at [address] Results: Creatinine 4.1 mg/dL. COMPARISON:  08/19/2018 PET-CT.  06/05/2018 CT abdomen/pelvis. FINDINGS: Lower chest: No significant pulmonary nodules or acute consolidative airspace disease. Cardiomegaly. Coronary atherosclerosis. Hepatobiliary: Normal liver size. Simple 1.0 cm right liver dome cyst. Several subcentimeter hypodense liver lesions scattered throughout the liver, too small to characterize, not appreciably changed since 06/05/2018 CT, probably benign. No appreciable new liver lesions. Normal gallbladder with no radiopaque cholelithiasis. No biliary ductal dilatation. Pancreas: Normal, with no mass or duct dilation. Spleen: Normal size. No mass. Adrenals/Urinary Tract: Normal adrenals. No renal stones. No hydronephrosis. Dominant 7.2 x 6.2 cm posterior upper left renal cortical mass with density 29 HU (series 2/image 23), increased from 4.6 x 4.8 cm on 06/05/2018 CT. Mixed attenuation 5.1 x 4.2 cm renal cortical mass in the medial lower right kidney (series 2/image 37), previously 5.0 x 4.0 cm on 06/05/2018 CT, not significantly changed. Numerous additional hyperdense renal cortical masses in both kidneys, largest 3.5 cm in the interpolar right kidney (series 2/image 30), increased from 2.8 cm. Several simple renal cysts scattered in both kidneys, largest 4.2 cm in medial interpolar left kidney. Chronic mild diffuse bladder  wall thickening is unchanged. No significant bladder distention. No bladder stones. Stomach/Bowel: Normal non-distended stomach. Status post subtotal right hemicolectomy with ileocolic anastomosis in the right abdomen. No small bowel dilatation or wall thickening. Oral contrast transits to the rectum. Mild scattered diverticulosis throughout the remnant large-bowel, with no definite large bowel wall thickening or acute pericolonic fat stranding. There is prominent diastasis of the midline supraumbilical ventral abdominal wall (series 2/image 27) containing a portion of the transverse colon. There is a tiny umbilical hernia containing a portion of a small bowel loop. Vascular/Lymphatic: Atherosclerotic abdominal aorta with ectatic 2.8 cm infrarenal abdominal aorta. 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 No pathologically enlarged lymph nodes in the abdomen or pelvis. Reproductive: Markedly enlarged prostate with mass-effect on the bladder base with nonspecific internal prostatic calcifications, not appreciably changed. Other: No pneumoperitoneum, ascites or focal fluid collection. Musculoskeletal: No aggressive appearing focal osseous lesions. Marked thoracolumbar spondylosis. IMPRESSION: 1. Significant interval growth of 7.2 cm posterior upper left renal cortical mass, which was hypermetabolic on 50/53/9767 PET-CT, and which was enhancing on 03/24/2018 CT abdomen/pelvis. 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 Urology consultation advised. 2. Numerous additional complex hyperdense bilateral renal masses are incompletely evaluated on this noncontrast study. Additional renal cell carcinomas cannot be excluded. MRI abdomen without  and with IV contrast is indicated for further evaluation. 3. Prominent diastasis of the midline supraumbilical ventral abdominal wall, containing a portion of the transverse colon. 4. Separate tiny umbilical hernia  contains a portion of a small bowel loop. No acute bowel complication. 5. Cardiomegaly.  Coronary atherosclerosis. 6. Chronic mild diffuse bladder wall thickening, probably due to chronic bladder outlet obstruction by the markedly enlarged prostate. No hydronephrosis. 7. 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 Recommend follow-up aortic ultrasound in 5 years. This recommendation follows ACR consensus guidelines: White Paper of the ACR Incidental Findings Committee II on Vascular Findings. J Am Coll Radiol 2013; 10:789-794. 8.  Aortic Atherosclerosis (ICD10-I70.0). These results will be called to the ordering clinician or representative by the Radiologist Assistant, and communication documented in the PACS or zVision Dashboard. Patient's acute renal failure as detected by creatinine testing prior to the scan was called by the CT technologist to triage RN Abigail Butts in the provider's office. Electronically Signed   By: Ilona Sorrel M.D.   On: 06/01/2019 14:00   US Renal  Result Date: 06/04/2019 CLINICAL DATA:  Acute kidney injury. EXAM: RENAL / URINARY TRACT ULTRASOUND COMPLETE COMPARISON:  Abdominopelvic CT 06/01/2019. Renal ultrasound 06/06/2018. PET-CT 08/19/2018. FINDINGS: Right Kidney: Renal measurements: 13.0 x 5.6 x 4.9 cm = volume: 185.1 mL. There is a simple appearing cystic lesion in the upper interpolar region measuring 3.0 x 2.6 x 2.7 cm. In the lower pole, there is a mildly septated cystic lesion measuring 4.2 x 3.3 x 3.7 cm. Numerous other smaller cystic lesions are present. No hydronephrosis. Left Kidney: Renal measurements: 10.5 x 6.2 x 4.4 cm = volume: 151 mL. The known solid mass in the upper pole which was hypermetabolic on previous PET-CT shows continued enlargement as seen on earlier CT. This now measures 7.3 x 6.0 x 8.2 cm (4.9 x 4.5 x 4.5 cm on previous ultrasound of 1 year ago). Color Doppler analysis of this lesion is suboptimal. There are additional cystic  lesions in the interpolar region, measuring up to 4.2 and 3.5 cm respectively. No hydronephrosis. Bladder: Median lobe prostate hypertrophy protrudes into the bladder lumen as seen on previous studies. There is a separate filling defect which was incompletely evaluated by this examination, probably reflecting blood clot. Patient was unable to complete the examination, needing to void. IMPRESSION: 1. Known solid mass involving the upper pole of the left kidney shows continued enlargement as seen on earlier CT scan. This remains consistent with neoplasm, likely renal cell carcinoma. Please refer to recommendations on CT scan performed 06/01/2019. 2. Additional simple and complex cystic lesions bilaterally. No hydronephrosis. 3. Filling defect in the urinary bladder lumen adjacent to an enlarged median lobe, probably blood clot. This was incompletely evaluated by this examination. Electronically Signed   By: Richardean Sale M.D.   On: 06/04/2019 11:38   Dg Foot 2 Views Left  Result Date: 06/03/2019 CLINICAL DATA:  One week of great toe swelling and pain EXAM: LEFT FOOT - 2 VIEW COMPARISON:  None. FINDINGS: Soft tissue swelling medial to the first MTP joint with soft tissue calcification. Mild hallux valgus. No definite juxta articular or marginal erosion. IMPRESSION: Swelling and soft tissue calcification at the first MTP joint, suspect gout. Electronically Signed   By: Monte Fantasia M.D.   On: 06/03/2019 05:40    ASSESSMENT & PLAN:   1) Progressive Kappa light chain Multiple Myeloma -- with likely progression since patient abandoned treatment earlier this year. 2)  Worsening renal failure -- likely myeloma kidney 3) Anemia due to CKD, myeloma , RCC  4) RCC - 7.6 cms in upper pole of rt kidney.  PLAN: -Discussed pt labwork today, 06/23/19; all values are WNL except for RBC at 2.39, Hgb at 7.9, HCT at 23.5, BUN at 61, Creatinine at 4.93, Alkaline Phosphatase at 173, GFR Est AFR Am at 12. -Discussed  10/21/202020 K/L light chains is in progress; 06/06/2019 K/L light chains is as follows: Kappa free light chain at 8761.3, Lamda free light chains at 23.2, Kappa lamda light chain ratio at 377.64 -Discussed 06/23/2019 MMP is in progress  -Pt's anemia and poor kidney function is concerning for MM treatment -Pt will likely need dialysis support during treatment - pt is open to having it if necessary . He was informed to call and f/u with Dr Hollie Salk ASAP -Advised pt that his treatment would be palliative, not curative  -Discussed that his treatment would be involved, could decompensate his other organs and might not be tolerable -Pt is certain that he would like to begin treatment at this time.  -his renal function and myeloma will need to be stabilized prior to consideration of nephrectomy for RCC. -Will give 1 unit of PRBC in the next 1-2 days -Will order a whole body PET/CETS Scan -Advised pt to see Dr. Hollie Salk at [place] -Will restart pt on CyBorD ASAP -Weekly labs with treatment -Will see pt back with second CyBorD treatment   FOLLOW UP: PRBC transfusion x 1 in 1-2 days Schedule to start weekly CyBorD treatment ASAP Weekly labs while on treatment MD visit with 2nd weekly treatment for toxicity check   All of the patients questions were answered with apparent satisfaction. The patient knows to call the clinic with any problems, questions or concerns.  I spent 30 mins counseling the patient face to face. The total time spent in the appointment was 45 minutes and more than 50% was on counseling and direct patient cares.    Sullivan Lone MD [place] AAHIVMS [facility] Hematology/Oncology Physician [facility]  (Office):       [phone] (Work cell):  [phone] (Fax):           [phone]  06/23/2019 3:46 AM  I, Yevette Edwards, am acting as a scribe for Dr. Sullivan Lone.   .I have reviewed the above documentation for accuracy and completeness, and I agree with  the above. Brunetta Genera MD

## 2019-06-23 NOTE — Telephone Encounter (Signed)
Critical lab received 1420: Creatinine 4.93. Desk RN Carlyon Prows made aware.

## 2019-06-24 ENCOUNTER — Telehealth: Payer: Self-pay | Admitting: *Deleted

## 2019-06-24 ENCOUNTER — Other Ambulatory Visit: Payer: Self-pay | Admitting: *Deleted

## 2019-06-24 ENCOUNTER — Telehealth: Payer: Self-pay | Admitting: Hematology

## 2019-06-24 DIAGNOSIS — C9000 Multiple myeloma not having achieved remission: Secondary | ICD-10-CM

## 2019-06-24 DIAGNOSIS — C649 Malignant neoplasm of unspecified kidney, except renal pelvis: Secondary | ICD-10-CM

## 2019-06-24 LAB — KAPPA/LAMBDA LIGHT CHAINS
Kappa free light chain: 10969.6 mg/L — ABNORMAL HIGH (ref 3.3–19.4)
Kappa, lambda light chain ratio: 410.85 — ABNORMAL HIGH (ref 0.26–1.65)
Lambda free light chains: 26.7 mg/L — ABNORMAL HIGH (ref 5.7–26.3)

## 2019-06-24 NOTE — Telephone Encounter (Signed)
Contacted all numbers in chart and also contacted son to notify patient of appointments on 10/23 for lab and transfusion. Son provided new home # for patient [phone] - chart updated.  Contacted patient on new home number - patient verbalized understanding of times for 10/23 appts: lab (12n) and transfusion (1p)

## 2019-06-24 NOTE — Telephone Encounter (Signed)
Scheduled appt per 10/21 los.  Was not able to reach the pt...  Left a VM of the appt date and time.

## 2019-06-25 ENCOUNTER — Telehealth: Payer: Self-pay | Admitting: Hematology

## 2019-06-25 ENCOUNTER — Encounter (HOSPITAL_COMMUNITY): Payer: Self-pay

## 2019-06-25 ENCOUNTER — Inpatient Hospital Stay: Payer: Medicare Other

## 2019-06-25 ENCOUNTER — Other Ambulatory Visit: Payer: Self-pay | Admitting: Hematology

## 2019-06-25 ENCOUNTER — Inpatient Hospital Stay (HOSPITAL_COMMUNITY)
Admission: EM | Admit: 2019-06-25 | Discharge: 2019-07-02 | DRG: 674 | Disposition: A | Discharge status: Home / Self Care | Payer: Medicare Other | Source: Ambulatory Visit | Attending: Internal Medicine | Admitting: Internal Medicine

## 2019-06-25 ENCOUNTER — Telehealth: Payer: Self-pay

## 2019-06-25 ENCOUNTER — Other Ambulatory Visit: Payer: Self-pay

## 2019-06-25 DIAGNOSIS — I480 Paroxysmal atrial fibrillation: Secondary | ICD-10-CM | POA: Diagnosis present

## 2019-06-25 DIAGNOSIS — Z79899 Other long term (current) drug therapy: Secondary | ICD-10-CM

## 2019-06-25 DIAGNOSIS — Z992 Dependence on renal dialysis: Secondary | ICD-10-CM

## 2019-06-25 DIAGNOSIS — E1169 Type 2 diabetes mellitus with other specified complication: Secondary | ICD-10-CM

## 2019-06-25 DIAGNOSIS — C641 Malignant neoplasm of right kidney, except renal pelvis: Secondary | ICD-10-CM | POA: Diagnosis present

## 2019-06-25 DIAGNOSIS — Z85038 Personal history of other malignant neoplasm of large intestine: Secondary | ICD-10-CM

## 2019-06-25 DIAGNOSIS — Z8546 Personal history of malignant neoplasm of prostate: Secondary | ICD-10-CM

## 2019-06-25 DIAGNOSIS — E119 Type 2 diabetes mellitus without complications: Secondary | ICD-10-CM

## 2019-06-25 DIAGNOSIS — R7989 Other specified abnormal findings of blood chemistry: Secondary | ICD-10-CM

## 2019-06-25 DIAGNOSIS — N179 Acute kidney failure, unspecified: Principal | ICD-10-CM | POA: Diagnosis present

## 2019-06-25 DIAGNOSIS — Z8701 Personal history of pneumonia (recurrent): Secondary | ICD-10-CM

## 2019-06-25 DIAGNOSIS — I712 Thoracic aortic aneurysm, without rupture: Secondary | ICD-10-CM | POA: Diagnosis present

## 2019-06-25 DIAGNOSIS — J449 Chronic obstructive pulmonary disease, unspecified: Secondary | ICD-10-CM | POA: Diagnosis present

## 2019-06-25 DIAGNOSIS — Z6832 Body mass index (BMI) 32.0-32.9, adult: Secondary | ICD-10-CM

## 2019-06-25 DIAGNOSIS — R0600 Dyspnea, unspecified: Secondary | ICD-10-CM

## 2019-06-25 DIAGNOSIS — C9000 Multiple myeloma not having achieved remission: Secondary | ICD-10-CM

## 2019-06-25 DIAGNOSIS — I2510 Atherosclerotic heart disease of native coronary artery without angina pectoris: Secondary | ICD-10-CM | POA: Diagnosis present

## 2019-06-25 DIAGNOSIS — D630 Anemia in neoplastic disease: Secondary | ICD-10-CM | POA: Diagnosis present

## 2019-06-25 DIAGNOSIS — N400 Enlarged prostate without lower urinary tract symptoms: Secondary | ICD-10-CM

## 2019-06-25 DIAGNOSIS — C642 Malignant neoplasm of left kidney, except renal pelvis: Secondary | ICD-10-CM | POA: Diagnosis present

## 2019-06-25 DIAGNOSIS — Z9221 Personal history of antineoplastic chemotherapy: Secondary | ICD-10-CM

## 2019-06-25 DIAGNOSIS — E785 Hyperlipidemia, unspecified: Secondary | ICD-10-CM | POA: Diagnosis present

## 2019-06-25 DIAGNOSIS — E669 Obesity, unspecified: Secondary | ICD-10-CM | POA: Diagnosis present

## 2019-06-25 DIAGNOSIS — G4733 Obstructive sleep apnea (adult) (pediatric): Secondary | ICD-10-CM | POA: Diagnosis present

## 2019-06-25 DIAGNOSIS — Z20828 Contact with and (suspected) exposure to other viral communicable diseases: Secondary | ICD-10-CM | POA: Diagnosis present

## 2019-06-25 DIAGNOSIS — Z880 Allergy status to penicillin: Secondary | ICD-10-CM

## 2019-06-25 DIAGNOSIS — Z825 Family history of asthma and other chronic lower respiratory diseases: Secondary | ICD-10-CM

## 2019-06-25 DIAGNOSIS — C649 Malignant neoplasm of unspecified kidney, except renal pelvis: Secondary | ICD-10-CM

## 2019-06-25 DIAGNOSIS — N183 Chronic kidney disease, stage 3 unspecified: Secondary | ICD-10-CM | POA: Diagnosis present

## 2019-06-25 DIAGNOSIS — I130 Hypertensive heart and chronic kidney disease with heart failure and stage 1 through stage 4 chronic kidney disease, or unspecified chronic kidney disease: Secondary | ICD-10-CM | POA: Diagnosis present

## 2019-06-25 DIAGNOSIS — Z87891 Personal history of nicotine dependence: Secondary | ICD-10-CM

## 2019-06-25 DIAGNOSIS — D631 Anemia in chronic kidney disease: Secondary | ICD-10-CM | POA: Diagnosis present

## 2019-06-25 DIAGNOSIS — I5032 Chronic diastolic (congestive) heart failure: Secondary | ICD-10-CM | POA: Diagnosis present

## 2019-06-25 DIAGNOSIS — N401 Enlarged prostate with lower urinary tract symptoms: Secondary | ICD-10-CM | POA: Diagnosis present

## 2019-06-25 DIAGNOSIS — R338 Other retention of urine: Secondary | ICD-10-CM | POA: Diagnosis present

## 2019-06-25 DIAGNOSIS — E1122 Type 2 diabetes mellitus with diabetic chronic kidney disease: Secondary | ICD-10-CM | POA: Diagnosis present

## 2019-06-25 DIAGNOSIS — N186 End stage renal disease: Secondary | ICD-10-CM

## 2019-06-25 DIAGNOSIS — I252 Old myocardial infarction: Secondary | ICD-10-CM

## 2019-06-25 DIAGNOSIS — Z8249 Family history of ischemic heart disease and other diseases of the circulatory system: Secondary | ICD-10-CM

## 2019-06-25 DIAGNOSIS — N08 Glomerular disorders in diseases classified elsewhere: Secondary | ICD-10-CM

## 2019-06-25 DIAGNOSIS — Z7189 Other specified counseling: Secondary | ICD-10-CM

## 2019-06-25 LAB — CMP (CANCER CENTER ONLY)
ALT: 38 U/L (ref 0–44)
AST: 25 U/L (ref 15–41)
Albumin: 3.8 g/dL (ref 3.5–5.0)
Alkaline Phosphatase: 178 U/L — ABNORMAL HIGH (ref 38–126)
Anion gap: 10 (ref 5–15)
BUN: 62 mg/dL — ABNORMAL HIGH (ref 8–23)
CO2: 25 mmol/L (ref 22–32)
Calcium: 9 mg/dL (ref 8.9–10.3)
Chloride: 103 mmol/L (ref 98–111)
Creatinine: 5.69 mg/dL (ref 0.61–1.24)
GFR, Est AFR Am: 10 mL/min — ABNORMAL LOW (ref >60)
GFR, Estimated: 9 mL/min — ABNORMAL LOW (ref >60)
Glucose, Bld: 163 mg/dL — ABNORMAL HIGH (ref 70–99)
Potassium: 3.6 mmol/L (ref 3.5–5.1)
Sodium: 138 mmol/L (ref 135–145)
Total Bilirubin: 0.6 mg/dL (ref 0.3–1.2)
Total Protein: 7.2 g/dL (ref 6.5–8.1)

## 2019-06-25 LAB — CBC WITH DIFFERENTIAL/PLATELET
Abs Immature Granulocytes: 0.01 10*3/uL (ref 0.00–0.07)
Basophils Absolute: 0 10*3/uL (ref 0.0–0.1)
Basophils Relative: 0 %
Eosinophils Absolute: 0.1 10*3/uL (ref 0.0–0.5)
Eosinophils Relative: 2 %
HCT: 23.6 % — ABNORMAL LOW (ref 39.0–52.0)
Hemoglobin: 7.8 g/dL — ABNORMAL LOW (ref 13.0–17.0)
Immature Granulocytes: 0 %
Lymphocytes Relative: 17 %
Lymphs Abs: 0.9 10*3/uL (ref 0.7–4.0)
MCH: 32.5 pg (ref 26.0–34.0)
MCHC: 33.1 g/dL (ref 30.0–36.0)
MCV: 98.3 fL (ref 80.0–100.0)
Monocytes Absolute: 0.4 10*3/uL (ref 0.1–1.0)
Monocytes Relative: 8 %
Neutro Abs: 3.8 10*3/uL (ref 1.7–7.7)
Neutrophils Relative %: 73 %
Platelets: 202 10*3/uL (ref 150–400)
RBC: 2.4 MIL/uL — ABNORMAL LOW (ref 4.22–5.81)
RDW: 12.3 % (ref 11.5–15.5)
WBC: 5.2 10*3/uL (ref 4.0–10.5)
nRBC: 0 % (ref 0.0–0.2)

## 2019-06-25 LAB — COMPREHENSIVE METABOLIC PANEL
ALT: 41 U/L (ref 0–44)
AST: 28 U/L (ref 15–41)
Albumin: 3.7 g/dL (ref 3.5–5.0)
Alkaline Phosphatase: 161 U/L — ABNORMAL HIGH (ref 38–126)
Anion gap: 11 (ref 5–15)
BUN: 61 mg/dL — ABNORMAL HIGH (ref 8–23)
CO2: 23 mmol/L (ref 22–32)
Calcium: 9.1 mg/dL (ref 8.9–10.3)
Chloride: 103 mmol/L (ref 98–111)
Creatinine, Ser: 5.92 mg/dL — ABNORMAL HIGH (ref 0.61–1.24)
GFR calc Af Amer: 10 mL/min — ABNORMAL LOW (ref >60)
GFR calc non Af Amer: 9 mL/min — ABNORMAL LOW (ref >60)
Glucose, Bld: 101 mg/dL — ABNORMAL HIGH (ref 70–99)
Potassium: 3.7 mmol/L (ref 3.5–5.1)
Sodium: 137 mmol/L (ref 135–145)
Total Bilirubin: 0.5 mg/dL (ref 0.3–1.2)
Total Protein: 6.8 g/dL (ref 6.5–8.1)

## 2019-06-25 LAB — PREPARE RBC (CROSSMATCH)

## 2019-06-25 LAB — CBC
HCT: 24.5 % — ABNORMAL LOW (ref 39.0–52.0)
Hemoglobin: 7.8 g/dL — ABNORMAL LOW (ref 13.0–17.0)
MCH: 32.5 pg (ref 26.0–34.0)
MCHC: 31.8 g/dL (ref 30.0–36.0)
MCV: 102.1 fL — ABNORMAL HIGH (ref 80.0–100.0)
Platelets: 226 10*3/uL (ref 150–400)
RBC: 2.4 MIL/uL — ABNORMAL LOW (ref 4.22–5.81)
RDW: 12.3 % (ref 11.5–15.5)
WBC: 5.5 10*3/uL (ref 4.0–10.5)
nRBC: 0 % (ref 0.0–0.2)

## 2019-06-25 MED ORDER — SODIUM CHLORIDE 0.9% IV SOLUTION
250.0000 mL | Freq: Once | INTRAVENOUS | Status: AC
  Administered 2019-06-25: 250 mL via INTRAVENOUS
  Filled 2019-06-25: qty 250

## 2019-06-25 MED ORDER — ACETAMINOPHEN 325 MG PO TABS
650.0000 mg | ORAL_TABLET | Freq: Once | ORAL | Status: AC
  Administered 2019-06-25: 650 mg via ORAL

## 2019-06-25 MED ORDER — DIPHENHYDRAMINE HCL 25 MG PO CAPS
ORAL_CAPSULE | ORAL | Status: AC
  Filled 2019-06-25: qty 1

## 2019-06-25 MED ORDER — DIPHENHYDRAMINE HCL 25 MG PO CAPS
25.0000 mg | ORAL_CAPSULE | Freq: Once | ORAL | Status: AC
  Administered 2019-06-25: 25 mg via ORAL

## 2019-06-25 MED ORDER — ACETAMINOPHEN 325 MG PO TABS
ORAL_TABLET | ORAL | Status: AC
  Filled 2019-06-25: qty 2

## 2019-06-25 NOTE — Progress Notes (Signed)
Per Dr. Irene Limbo, pt. not going to receive blood today. Request pt. go to [facility] Emergency due to kidney function. Pt. states he understands and will return friends car then get his son to take him to [facility] ED.

## 2019-06-25 NOTE — Telephone Encounter (Signed)
[name] recently reestablished oncology cares with [name].  He came in for labs and PRBC transfusion for anemia related to chronic kidney disease and his progressive myeloma.  Labs today showed significant worsening of his renal function with creatinine up to 5.7.  Patient also reports that similar to previously he is having difficulty with emptying his bladder and has significant symptoms and signs of urinary retention and abdominal distention and discomfort.  He notes that he previously had blood in the bladder which caused urinary retention. His nephrologist Dr. Hollie Salk was forwarded his results to plan for close nephrology follow-up as outpatient. Patient was instructed to hold his PRBC transfusion today due to risk of volume overload in the setting of urinary obstruction. He was unequivocally recommended to go to the [name] emergency room for urgent urinary catheterization and further management of his urinary obstruction as well as possible consultation of nephrology to address his rapidly worsening renal function.  I discussed with the patient that his renal function needs to be stabilized and there needs to be a plan from his nephrologist regarding his worsening renal function prior to being able to safely treat his myeloma since his renal function is pretty close to needing hemodialysis.  Patient is uncertain if he would consider hemodialysis and I recommended that he have a detailed and extensive goals of care discussion regarding this with his nephrologist.  We are concerned that restarting his myeloma treatment may lead to tumor lysis and rapid decompensation of his renal function electrolytes and acid-base balance.  Patient understands our concerns and understands his responsibility to go to the emergency room and address his urinary retention and to address his worsening renal function. Would recommend 1 to 2 units of PRBC transfusion in the emergency room if possible progressive  symptomatic anemia related to myeloma and renal insufficiency and possible hematuria from his renal tumor.  [name] Genera MD MS

## 2019-06-25 NOTE — Telephone Encounter (Signed)
Dr. Irene Limbo made aware of critical creatinine result of 5.69. No further instruction received at this time.

## 2019-06-25 NOTE — ED Triage Notes (Signed)
Pt endorses being sent by cancer center for low hgb and urinary retention. Pt states that he has not been urinating all the way, supposed to self cath but having difficulty doing so due to blood in his urine. VSS.

## 2019-06-25 NOTE — ED Provider Notes (Signed)
[facility] EMERGENCY DEPARTMENT Provider Note   CSN: [id number] Arrival date & time: 06/25/19  1709     History   Chief Complaint Chief Complaint  Patient presents with  . Abnormal Lab    HPI [name] [name] is a 73 y.o. male.      Patient is a 73 year old male with history of CHF, COPD, chronic kidney disease, hypertension, hyperlipidemia, multiple myeloma, renal cell carcinoma, BPH who has to self catheterize who presents to the emergency department with  CC of urinary retention.  He states that he normally self caths, but has never been doing so because always been getting back his blood.  He was seen by his oncologist for blood transfusion for worsening symptomatic anemia secondary to chronic disease, but was noted to have worsening creatinine and concern for urinary retention.  His transfusion was held, and he was encouraged to come to the ER for evaluation of urinary retention and worsening kidney function.  The history is provided by the patient. No language interpreter was used.    Past Medical History:  Diagnosis Date  . Abnormal CT of liver    a. nodular contour suggesting cirrhosis 05/2018.  [name] Abnormal LFTs (liver function tests)   . Anxiety   . Aortic root dilatation ([place])   . Ascending aortic aneurysm ([place])   . BPH with urinary obstruction   . Bradycardia   . 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 CAD (coronary artery disease)    a. 12/2015 NSTEMI/Cath (in setting of PAF):  LM nl, LAD 30p,  LCX 53m RCA  ok, AM 100%, RPDA1 40, RPDA 2 60 ->Med Rx.  . Childhood asthma   . Chronic diastolic CHF (congestive heart failure) ([place]    a. 12/2015 Echo: EF 50-55%, mod AI, mod Ao root dil, mild MR, mod dil LA, mod RA.  . CKD (chronic kidney disease), stage III   . Colon cancer ([place]   . COPD (chronic obstructive pulmonary disease) ([place]    pt denies at preop  . Diabetes  mellitus type II    diet controlled  . History of syncope   . Hyperlipidemia   . Hypertensive heart disease   . Kidney lump 04/04/2010   Overview:  Renal Cell Carcinoma   . 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 Moderate aortic insufficiency    a. 12/2015 Echo: Mod AI.  .Marland KitchenParoxysmal atrial fibrillation ([place]    a. 12/2015 started on Xarelto (CHA2DS2VASc = 4-5).  . Pneumonia   . Prostate cancer ([place]   . Renal cell carcinoma ([place]   . Sleep apnea    Does not like  CPAP  . Spinal stenosis in cervical region 10/12/2016  . Venous insufficiency     Patient Active Problem List   Diagnosis Date Noted  . Acute renal failure superimposed on stage 3 chronic kidney disease (HF[place] 06/04/2019  . CKD (chronic kidney disease), stage III 06/04/2019  . Renal cell carcinoma ([place] 06/04/2019  . Gross hematuria 06/04/2019  . COPD exacerbation ([place] 11/05/2018  . Palpitations 10/22/2018  . Counseling regarding advance care planning and goals of care 06/22/2018  . AKI (acute kidney injury) ([place]   . Anemia   . Sepsis (HGy[place] 06/05/2018  . Acute lower UTI 06/05/2018  . Colon polyp 05/21/2018  . Aortic valve regurgitation 04/14/2018  . Medication management 04/14/2018  . Thoracic aortic aneurysm without rupture (HN[place]it[place] 09/29/2017  .  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 Head trauma, subsequent encounter 10/12/2016  . 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 Spinal stenosis in cervical region 10/12/2016  . Cervical spondylosis 10/12/2016  . Syncope and collapse 10/12/2016  . Fall   . Syncope 10/11/2016  . CAD (coronary artery disease)   . Chronic diastolic CHF (congestive heart failure) ([place])   . Hyperlipidemia   . Hypertensive heart disease   . Paroxysmal atrial fibrillation ([place]) 12/17/2015  . 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 Mild intermittent asthma 10/18/2015  . Aortic root dilatation ([place]) 10/17/2015   . Diabetes ([place]) 10/17/2015  . 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 Hand paresthesia 12/09/2014  . Elevated prostate specific antigen (PSA) 07/12/2014  . Allergic rhinitis 07/12/2014  . LBP (low back pain) 07/12/2014  . Palpitation 07/11/2014  . Patient's other noncompliance with medication regimen 08/10/2013  . Chronic venous insufficiency 07/23/2012  . Obstructive apnea 04/11/2011  . Essential (primary) hypertension 09/07/2010  . 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 Anxiety, generalized 04/04/2010  . Cardiac conduction disorder 02/27/2010  . Benign prostatic hyperplasia without urinary obstruction 09/13/2009  . Abnormal findings on examination of genitourinary organs 08/09/2009  . Hereditary and idiopathic neuropathy 07/05/2009    Past Surgical History:  Procedure Laterality Date  . BACK SURGERY    . CARDIAC CATHETERIZATION N/A 12/18/2015   Procedure: Left Heart Cath and Coronary Angiography;  Surgeon: Leonie Man, MD;  Location: [facility];  Service: Cardiovascular;  Laterality: N/A;  . DIAGNOSTIC LAPAROSCOPY     partial colectomy  . LAPAROSCOPIC PARTIAL COLECTOMY Right 05/21/2018   Procedure: LAPAROSCOPIC RIGHT  COLECTOMY ERAS PATHWAY;  Surgeon: Leighton Ruff, MD;  Location: [facility];  Service: General;  Laterality: Right;  . [place]   "replaced a disc"        Home Medications    Prior to Admission medications   Medication Sig Start Date End Date Taking? Authorizing Provider  amLODipine (NORVASC) 10 MG tablet Take 1 tablet (10 mg total) by mouth daily. 05/17/19   Fulp, Cammie, MD  atorvastatin (LIPITOR) 80 MG tablet Take 1 tablet (80 mg total) by mouth daily. 05/17/19   Fulp, Cammie, MD  cetirizine (ZYRTEC) 10 MG tablet Take 10 mg by mouth daily.    [provider]  cholecalciferol (VITAMIN D3) 25 MCG (1000 UT) tablet Take 1,000 Units by mouth daily.    [provider]   finasteride (PROSCAR) 5 MG tablet Take 1 tablet (5 mg total) by mouth daily. 06/10/19   [place] Shi, MD  fluticasone (FLONASE) 50 MCG/ACT nasal spray Place 2 sprays into both nostrils daily. 08/18/18   [provider]  glucose blood test strip Check blood sugar twice per day. E.11.9 10/06/18   Scot Jun, FNP    Family History Family History  Problem Relation Age of Onset  . Emphysema Father   . Asthma Father   . Liver disease Father        tumor  . Heart disease Mother   . Hypertension Mother   . Asthma Sister   . Hypertension Son     Social History Social History   Tobacco Use  . Smoking status: Former Smoker    Types: Cigars    Quit date: 09/02/1977    Years since quitting: 41.8  . Smokeless tobacco: Never Used  .  Tobacco comment:    Substance Use Topics  . Alcohol use: Not Currently    Comment: "stopped drinking alcohol in ~ 1980; just drank a little on the weekends when I did drink"  . Drug use: No     Allergies   Amoxicillin and Penicillins   Review of Systems Review of Systems  All other systems reviewed and are negative.    Physical Exam Updated Vital Signs BP (!) 153/89   Pulse (!) 52   Temp 97.7 F (36.5 C) (Oral)   Resp 15   SpO2 98%   Physical Exam Vitals signs and nursing note reviewed.  Constitutional:      Appearance: He is well-developed.  HENT:     Head: Normocephalic and atraumatic.  Eyes:     Conjunctiva/sclera: Conjunctivae normal.  Neck:     Musculoskeletal: Neck supple.  Cardiovascular:     Rate and Rhythm: Normal rate and regular rhythm.     Heart sounds: No murmur.  Pulmonary:     Effort: Pulmonary effort is normal. No respiratory distress.     Breath sounds: Normal breath sounds.  Abdominal:     Palpations: Abdomen is soft.     Tenderness: There is no abdominal tenderness.     Comments: Non-tender hernia felt in central abdomen  Musculoskeletal: Normal range of motion.  Skin:    General: Skin is  warm and dry.  Neurological:     Mental Status: He is alert and oriented to person, place, and time.  Psychiatric:        Mood and Affect: Mood normal.        Behavior: Behavior normal.      ED Treatments / Results  Labs (all labs ordered are listed, but only abnormal results are displayed) Labs Reviewed  COMPREHENSIVE METABOLIC PANEL - Abnormal; Notable for the following components:      Result Value   Glucose, Bld 101 (*)    BUN 61 (*)    Creatinine, Ser 5.92 (*)    Alkaline Phosphatase 161 (*)    GFR calc non Af Amer 9 (*)    GFR calc Af Amer 10 (*)    All other components within normal limits  CBC - Abnormal; Notable for the following components:   RBC 2.40 (*)    Hemoglobin 7.8 (*)    HCT 24.5 (*)    MCV 102.1 (*)    All other components within normal limits  SARS CORONAVIRUS 2 (TAT 6-24 HRS)  TYPE AND SCREEN    EKG None  Radiology No results found.  Procedures Procedures (including critical care time)  Medications Ordered in ED Medications - No data to display   Initial Impression / Assessment and Plan / ED Course  I have reviewed the triage vital signs and the nursing notes.  Pertinent labs & imaging results that were available during my care of the patient were reviewed by me and considered in my medical decision making (see chart for details).       Patient sent to emergency department from oncology clinic for worsening creatinine in the setting of possible urinary retention.  His creatinine is now 5.92.  His hemoglobin is 7.8.  He was at the oncology clinic to get transfused today.  He complains of some intermittent shortness of breath, but denies any lightheadedness or dizziness.  Believe his increasing creatinine is secondary to his multiple myeloma.  I discussed the case with Dr. Marlowe Sax, who is appreciated for admitting the patient.  Plan will be for nephrology consultation in the morning plus or minus transfusion for worsening symptomatic  anemia, although he is not very symptomatic right now and his hemoglobin is not too bad at 7.8.     Final Clinical Impressions(s) / ED Diagnoses   Final diagnoses:  Elevated serum creatinine    ED Discharge Orders    None       Montine Circle, PA-C 06/26/19 0225    Orpah Greek, MD 06/26/19 [phone]

## 2019-06-26 ENCOUNTER — Other Ambulatory Visit: Payer: Self-pay

## 2019-06-26 ENCOUNTER — Encounter (HOSPITAL_COMMUNITY): Payer: Self-pay

## 2019-06-26 ENCOUNTER — Observation Stay (HOSPITAL_COMMUNITY): Payer: Medicare Other

## 2019-06-26 ENCOUNTER — Other Ambulatory Visit: Payer: Self-pay | Admitting: Oncology

## 2019-06-26 DIAGNOSIS — C9000 Multiple myeloma not having achieved remission: Secondary | ICD-10-CM

## 2019-06-26 DIAGNOSIS — N179 Acute kidney failure, unspecified: Secondary | ICD-10-CM | POA: Diagnosis not present

## 2019-06-26 DIAGNOSIS — Z7189 Other specified counseling: Secondary | ICD-10-CM

## 2019-06-26 LAB — BASIC METABOLIC PANEL
Anion gap: 12 (ref 5–15)
BUN: 62 mg/dL — ABNORMAL HIGH (ref 8–23)
CO2: 24 mmol/L (ref 22–32)
Calcium: 9.3 mg/dL (ref 8.9–10.3)
Chloride: 103 mmol/L (ref 98–111)
Creatinine, Ser: 6.11 mg/dL — ABNORMAL HIGH (ref 0.61–1.24)
GFR calc Af Amer: 10 mL/min — ABNORMAL LOW (ref >60)
GFR calc non Af Amer: 8 mL/min — ABNORMAL LOW (ref >60)
Glucose, Bld: 81 mg/dL (ref 70–99)
Potassium: 3.4 mmol/L — ABNORMAL LOW (ref 3.5–5.1)
Sodium: 139 mmol/L (ref 135–145)

## 2019-06-26 LAB — GLUCOSE, CAPILLARY
Glucose-Capillary: 121 mg/dL — ABNORMAL HIGH (ref 70–99)
Glucose-Capillary: 68 mg/dL — ABNORMAL LOW (ref 70–99)
Glucose-Capillary: 105 mg/dL — ABNORMAL HIGH (ref 70–99)
Glucose-Capillary: 132 mg/dL — ABNORMAL HIGH (ref 70–99)
Glucose-Capillary: 73 mg/dL (ref 70–99)

## 2019-06-26 LAB — CBC
HCT: 24.1 % — ABNORMAL LOW (ref 39.0–52.0)
Hemoglobin: 7.8 g/dL — ABNORMAL LOW (ref 13.0–17.0)
MCH: 32.2 pg (ref 26.0–34.0)
MCHC: 32.4 g/dL (ref 30.0–36.0)
MCV: 99.6 fL (ref 80.0–100.0)
Platelets: 211 10*3/uL (ref 150–400)
RBC: 2.42 MIL/uL — ABNORMAL LOW (ref 4.22–5.81)
RDW: 12.3 % (ref 11.5–15.5)
WBC: 5.2 10*3/uL (ref 4.0–10.5)
nRBC: 0 % (ref 0.0–0.2)

## 2019-06-26 LAB — URINALYSIS, ROUTINE W REFLEX MICROSCOPIC
Bilirubin Urine: NEGATIVE
Glucose, UA: NEGATIVE mg/dL
Ketones, ur: NEGATIVE mg/dL
Leukocytes,Ua: NEGATIVE
Nitrite: NEGATIVE
Protein, ur: 100 mg/dL — AB
RBC / HPF: >50 RBC/hpf — ABNORMAL HIGH (ref 0–5)
Specific Gravity, Urine: 1.011 (ref 1.005–1.030)
pH: 6 (ref 5.0–8.0)

## 2019-06-26 LAB — PREPARE RBC (CROSSMATCH)

## 2019-06-26 LAB — HEMOGLOBIN AND HEMATOCRIT, BLOOD
HCT: 26.9 % — ABNORMAL LOW (ref 39.0–52.0)
Hemoglobin: 9.2 g/dL — ABNORMAL LOW (ref 13.0–17.0)

## 2019-06-26 LAB — HEMOGLOBIN A1C
Hgb A1c MFr Bld: 5.2 % (ref 4.8–5.6)
Mean Plasma Glucose: 102.54 mg/dL

## 2019-06-26 LAB — SARS CORONAVIRUS 2 (TAT 6-24 HRS): SARS Coronavirus 2: NEGATIVE

## 2019-06-26 MED ORDER — FINASTERIDE 5 MG PO TABS
5.0000 mg | ORAL_TABLET | Freq: Every day | ORAL | Status: DC
  Administered 2019-06-26 – 2019-07-02 (×7): 5 mg via ORAL
  Filled 2019-06-26 (×8): qty 1

## 2019-06-26 MED ORDER — SODIUM CHLORIDE 0.45 % IV SOLN
INTRAVENOUS | Status: DC
  Administered 2019-06-26 – 2019-06-27 (×2): via INTRAVENOUS

## 2019-06-26 MED ORDER — INSULIN ASPART 100 UNIT/ML ~~LOC~~ SOLN
0.0000 [IU] | Freq: Three times a day (TID) | SUBCUTANEOUS | Status: DC
  Administered 2019-06-26 (×2): 1 [IU] via SUBCUTANEOUS

## 2019-06-26 MED ORDER — HYDRALAZINE HCL 20 MG/ML IJ SOLN
5.0000 mg | INTRAMUSCULAR | Status: DC | PRN
  Administered 2019-06-27 (×2): 5 mg via INTRAVENOUS
  Filled 2019-06-26 (×2): qty 1

## 2019-06-26 MED ORDER — ENSURE ENLIVE PO LIQD
237.0000 mL | Freq: Two times a day (BID) | ORAL | Status: DC
  Administered 2019-06-26 – 2019-07-01 (×10): 237 mL via ORAL
  Filled 2019-06-26 (×9): qty 237

## 2019-06-26 MED ORDER — ACETAMINOPHEN 325 MG PO TABS
650.0000 mg | ORAL_TABLET | Freq: Four times a day (QID) | ORAL | Status: DC | PRN
  Administered 2019-06-27: 23:00:00 650 mg via ORAL
  Filled 2019-06-26: qty 2

## 2019-06-26 MED ORDER — ACETAMINOPHEN 650 MG RE SUPP: 650.0000 mg | Freq: Four times a day (QID) | RECTAL | Status: DC | PRN

## 2019-06-26 MED ORDER — INSULIN ASPART 100 UNIT/ML ~~LOC~~ SOLN: 0.0000 [IU] | Freq: Every day | SUBCUTANEOUS | Status: DC

## 2019-06-26 MED ORDER — SODIUM CHLORIDE 0.9% IV SOLUTION
Freq: Once | INTRAVENOUS | Status: AC
  Administered 2019-06-26: 12:00:00 via INTRAVENOUS

## 2019-06-26 NOTE — ED Notes (Signed)
ED TO INPATIENT HANDOFF REPORT  ED Nurse Name and Phone #: [phone] Lauree Chandler., RN  S Name/Age/Gender [name] [name] 73 y.o. male Room/Bed: 018C/018C  Code Status   Code Status: Full Code  Home/SNF/Other Home Patient oriented to: self, place, time and situation Is this baseline? Yes   Triage Complete: Triage complete  Chief Complaint Low Hemoglobin, Bladder Blockage  Triage Note Pt endorses being sent by cancer center for low hgb and urinary retention. Pt states that he has not been urinating all the way, supposed to self cath but having difficulty doing so due to blood in his urine. VSS.    Allergies Allergies  Allergen Reactions  . Amoxicillin Other (See Comments)    Tolerates Unasyn. Can't move Has patient had a PCN reaction causing immediate rash, facial/tongue/throat swelling, SOB or lightheadedness with hypotension: No Has patient had a PCN reaction causing severe rash involving mucus membranes or skin necrosis: No Has patient had a PCN reaction that required hospitalization No Has patient had a PCN reaction occurring within the last 10 years: No If all of the above answers are "NO", then may proceed with Cephalosporin use.   [name] Penicillins Other (See Comments)    Tolerates Unasyn. Can't move/dizziness Has patient had a PCN reaction causing immediate rash, facial/tongue/throat swelling, SOB or lightheadedness with hypotension: No Has patient had a PCN reaction causing severe rash involving mucus membranes or skin necrosis: No Has patient had a PCN reaction that required hospitalization No Has patient had a PCN reaction occurring within the last 10 years: No If all of the above answers are "NO", then may proceed with Cephalosporin use.  Other reaction(s): O    Level of Care/Admitting Diagnosis ED Disposition    ED Disposition Condition Comment   Admit  Hospital Area: [facility] [[id number]]  Level of Care: Med-Surg [16]  I expect the patient will be  discharged within 24 hours: Yes  LOW acuity---Tx typically complete <24 hrs---ACUTE conditions typically can be evaluated <24 hours---LABS likely to return to acceptable levels <24 hours---IS near functional baseline---EXPECTED to return to current living arrangement---NOT newly hypoxic: Meets criteria for 5C-Observation unit  Covid Evaluation: Asymptomatic Screening Protocol (No Symptoms)  Diagnosis: Worsening renal function [2458099]  Admitting Physician: Shela Leff [[id number]]  Attending Physician: Shela Leff [[id number]]  PT Class (Do Not Modify): Observation [104]  PT Acc Code (Do Not Modify): Observation [10022]       B Medical/Surgery History Past Medical History:  Diagnosis Date  . Abnormal CT of liver    a. nodular contour suggesting cirrhosis 05/2018.  [name] Abnormal LFTs (liver function tests)   . Anxiety   . Aortic root dilatation ([place])   . Ascending aortic aneurysm ([place])   . BPH with urinary obstruction   . Bradycardia   . 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 CAD (coronary artery disease)    a. 12/2015 NSTEMI/Cath (in setting of PAF):  LM nl, LAD 30p,  LCX 53m, RCA  ok, AM 100%, RPDA1 40, RPDA 2 60 ->Med Rx.  . Childhood asthma   . Chronic diastolic CHF (congestive heart failure) ([place])    a. 12/2015 Echo: EF 50-55%, mod AI, mod Ao root dil, mild MR, mod dil LA, mod RA.  . CKD (chronic kidney disease), stage III   . Colon cancer ([place])   . COPD (chronic obstructive pulmonary disease) ([place])    pt denies at  preop  . Diabetes mellitus type II    diet controlled  . History of syncope   . Hyperlipidemia   . Hypertensive heart disease   . Kidney lump 04/04/2010   Overview:  Renal Cell Carcinoma   . 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 Moderate aortic insufficiency    a. 12/2015 Echo: Mod AI.  [name] Paroxysmal atrial fibrillation ([place])    a. 12/2015 started on Xarelto (CHA2DS2VASc = 4-5).  .  Pneumonia   . Prostate cancer ([place])   . Renal cell carcinoma ([place])   . Sleep apnea    Does not like  CPAP  . Spinal stenosis in cervical region 10/12/2016  . Venous insufficiency    Past Surgical History:  Procedure Laterality Date  . BACK SURGERY    . CARDIAC CATHETERIZATION N/A 12/18/2015   Procedure: Left Heart Cath and Coronary Angiography;  Surgeon: Leonie Man, MD;  Location: [facility];  Service: Cardiovascular;  Laterality: N/A;  . DIAGNOSTIC LAPAROSCOPY     partial colectomy  . LAPAROSCOPIC PARTIAL COLECTOMY Right 05/21/2018   Procedure: LAPAROSCOPIC RIGHT  COLECTOMY ERAS PATHWAY;  Surgeon: Leighton Ruff, MD;  Location[facility];  Service: General;  Laterality: Right;  . LUMBAR [facility] SURGERY  1974   "replaced a disc"     A IV Location/Drains/Wounds Patient Lines/Drains/Airways Status   Active Line/Drains/Airways    None          Intake/Output Last 24 hours No intake or output data in the 24 hours ending 06/26/19 0249  Labs/Imaging Results for orders placed or performed during the hospital encounter of 06/25/19 (from the past 48 hour(s))  Type and screen [place]     Status: None   Collection Time: 06/25/19  5:40 PM  Result Value Ref Range   ABO/RH(D) A POS    Antibody Screen NEG    Sample Expiration      06/28/2019,2359 Performed at [facility], [place] [address], [place], [place] [address]   Comprehensive metabolic panel     Status: Abnormal   Collection Time: 06/25/19  5:41 PM  Result Value Ref Range   Sodium 137 135 - 145 mmol/L   Potassium 3.7 3.5 - 5.1 mmol/L   Chloride 103 98 - 111 mmol/L   CO2 23 22 - 32 mmol/L   Glucose, Bld 101 (H) 70 - 99 mg/dL   BUN 61 (H) 8 - 23 mg/dL   Creatinine, Ser 5.92 (H) 0.61 - 1.24 mg/dL   Calcium 9.1 8.9 - 10.3 mg/dL   Total Protein 6.8 6.5 - 8.1 g/dL   Albumin 3.7 3.5 - 5.0 g/dL   AST 28 15 - 41 U/L   ALT 41 0 - 44 U/L   Alkaline Phosphatase 161 (H) 38 - 126 U/L   Total  Bilirubin 0.5 0.3 - 1.2 mg/dL   GFR calc non Af Amer 9 (L) >60 mL/min   GFR calc Af Amer 10 (L) >60 mL/min   Anion gap 11 5 - 15    Comment: Performed at [facility], [place] [address] [place], [place] [address]  CBC     Status: Abnormal   Collection Time: 06/25/19  5:41 PM  Result Value Ref Range   WBC 5.5 4.0 - 10.5 K/uL   RBC 2.40 (L) 4.22 - 5.81 MIL/uL   Hemoglobin 7.8 (L) 13.0 - 17.0 g/dL   HCT 24.5 (L) 39.0 - 52.0 %   MCV 102.1 (H) 80.0 -  100.0 fL   MCH 32.5 26.0 - 34.0 pg   MCHC 31.8 30.0 - 36.0 g/dL   RDW 12.3 11.5 - 15.5 %   Platelets 226 150 - 400 K/uL   nRBC 0.0 0.0 - 0.2 %    Comment: Performed at [facility], [place] [address] [place], [place] [address]   No results found.  Pending Labs Unresulted Labs (From admission, onward)    Start     Ordered   06/26/19 0500  CBC  Tomorrow morning,   R     06/26/19 0231   06/26/19 2876  Basic metabolic panel  Tomorrow morning,   R     06/26/19 0231   06/26/19 0230  Hemoglobin A1c  Once,   STAT    Comments: To assess prior glycemic control    06/26/19 0231   06/26/19 0135  SARS CORONAVIRUS 2 (TAT 6-24 HRS) Nasopharyngeal Nasopharyngeal Swab  (Symptomatic/High Risk of Exposure/Tier 1 Patients Labs with Precautions)  Once,   STAT    Question Answer Comment  Is this test for diagnosis or screening Diagnosis of ill patient   Symptomatic for COVID-19 as defined by CDC No   Hospitalized for COVID-19 No   Admitted to ICU for COVID-19 No   Previously tested for COVID-19 No   Resident in a congregate (group) care setting No   Employed in healthcare setting No      06/26/19 0135          Vitals/Pain Today's Vitals   06/26/19 0000 06/26/19 0030 06/26/19 0100 06/26/19 0130  BP: (!) 152/84 (!) 161/91 137/85 128/79  Pulse: (!) 55 (!) 59 (!) 59 (!) 56  Resp: 15 14 14 17   Temp:      TempSrc:      SpO2: 96% 96% 100% 97%    Isolation Precautions No active isolations  Medications Medications  finasteride  (PROSCAR) tablet 5 mg (has no administration in time range)  acetaminophen (TYLENOL) tablet 650 mg (has no administration in time range)    Or  acetaminophen (TYLENOL) suppository 650 mg (has no administration in time range)  insulin aspart (novoLOG) injection 0-9 Units (has no administration in time range)  insulin aspart (novoLOG) injection 0-5 Units (has no administration in time range)    Mobility walks with device     Focused Assessments Cardiac Assessment Handoff:    Lab Results  Component Value Date   TROPONINI <0.03 11/30/2018   Lab Results  Component Value Date   DDIMER 0.81 (H) 12/26/2015   Does the Patient currently have chest pain? No     R Recommendations: See Admitting Provider Note  Report given to:   Additional Notes:

## 2019-06-26 NOTE — ED Notes (Signed)
Pt transported to [place] then to be taken to the floor upon return.   [facility] staff and ED Charge RN informed of delay.

## 2019-06-26 NOTE — H&P (Signed)
History and Physical    [name] [name] VVO:[id number] DOB: [birth year] DOA: 06/25/2019  PCP: Scot Jun, FNP  Chief Complaint: Elevated creatinine, urinary retention  HPI: [name] [name] is a 73 y.o. male with medical history significant of BPH, paroxysmal atrial fibrillation, CAD, chronic diastolic congestive heart failure, CKD stage III, colon cancer, light chain myeloma, renal cell carcinoma, COPD, type 2 diabetes, hypertension, hyperlipidemia being sent to the ED by his oncologist Dr. Irene Limbo after he was noted to have worsening of his renal function with creatinine up to 5.7.  He also reported symptoms concerning for urinary retention.  Patient states he has had problems emptying his bladder fully and is seen by urology.  He feels this problem is now getting worse.  It takes multiple attempts for him to fully empty his bladder.  States he was seen by urology a week ago as he was noticing blood in his urine.  States he is supposed to see nephrology for his kidney problem but does not have an appointment yet.  ED Course: No acute urinary retention noted, bladder scan with 200 cc urine.  BUN 61, creatinine 5.9.  Renal function has been progressively worsening over time.  Hemoglobin 7.8, no significant change compared to recent labs.  Review of Systems:  All systems reviewed and apart from history of presenting illness, are negative.  Past Medical History:  Diagnosis Date  . Abnormal CT of liver    a. nodular contour suggesting cirrhosis 05/2018.  [name] Abnormal LFTs (liver function tests)   . Anxiety   . Aortic root dilatation ([place])   . Ascending aortic aneurysm ([place])   . BPH with urinary obstruction   . Bradycardia   . 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 CAD (coronary artery disease)    a. 12/2015 NSTEMI/Cath (in setting of PAF):  LM nl, LAD 30p,  LCX 70m, RCA  ok, AM 100%, RPDA1 40, RPDA 2 60 ->Med Rx.  .  Childhood asthma   . Chronic diastolic CHF (congestive heart failure) ([place])    a. 12/2015 Echo: EF 50-55%, mod AI, mod Ao root dil, mild MR, mod dil LA, mod RA.  . CKD (chronic kidney disease), stage III   . Colon cancer ([place])   . COPD (chronic obstructive pulmonary disease) ([place])    pt denies at preop  . Diabetes mellitus type II    diet controlled  . History of syncope   . Hyperlipidemia   . Hypertensive heart disease   . Kidney lump 04/04/2010   Overview:  Renal Cell Carcinoma   . Light chain myeloma ([place])   . Moderate aortic insufficiency    a. 12/2015 Echo: Mod AI.  [name] Paroxysmal atrial fibrillation ([place])    a. 12/2015 started on Xarelto (CHA2DS2VASc = 4-5).  . Pneumonia   . Prostate cancer ([place])   . Renal cell carcinoma ([place])   . Sleep apnea    Does not like  CPAP  . Spinal stenosis in cervical region 10/12/2016  . Venous insufficiency     Past Surgical History:  Procedure Laterality Date  . BACK SURGERY    . CARDIAC CATHETERIZATION N/A 12/18/2015   Procedure: Left Heart Cath and Coronary Angiography;  Surgeon: Leonie Man, MD;  Location: [facility];  Service: Cardiovascular;  Laterality: N/A;  . DIAGNOSTIC LAPAROSCOPY     partial colectomy  . LAPAROSCOPIC PARTIAL COLECTOMY Right 05/21/2018  Procedure: LAPAROSCOPIC RIGHT  COLECTOMY ERAS PATHWAY;  Surgeon: Leighton Ruff, MD;  Location: [facility];  Service: General;  Laterality: Right;  . [place]   "replaced a disc"     reports that he quit smoking about 41 years ago. His smoking use included cigars. He has never used smokeless tobacco. He reports previous alcohol use. He reports that he does not use drugs.  Allergies  Allergen Reactions  . Amoxicillin Other (See Comments)    Tolerates Unasyn. Can't move Has patient had a PCN reaction causing immediate rash, facial/tongue/throat swelling, SOB or lightheadedness with hypotension: No Has patient had a PCN reaction causing severe rash involving  mucus membranes or skin necrosis: No Has patient had a PCN reaction that required hospitalization No Has patient had a PCN reaction occurring within the last 10 years: No If all of the above answers are "NO", then may proceed with Cephalosporin use.   [name] Penicillins Other (See Comments)    Tolerates Unasyn. Can't move/dizziness Has patient had a PCN reaction causing immediate rash, facial/tongue/throat swelling, SOB or lightheadedness with hypotension: No Has patient had a PCN reaction causing severe rash involving mucus membranes or skin necrosis: No Has patient had a PCN reaction that required hospitalization No Has patient had a PCN reaction occurring within the last 10 years: No If all of the above answers are "NO", then may proceed with Cephalosporin use.  Other reaction(s): O    Family History  Problem Relation Age of Onset  . Emphysema Father   . Asthma Father   . Liver disease Father        tumor  . Heart disease Mother   . Hypertension Mother   . Asthma Sister   . Hypertension Son     Prior to Admission medications   Medication Sig Start Date End Date Taking? Authorizing Provider  amLODipine (NORVASC) 10 MG tablet Take 1 tablet (10 mg total) by mouth daily. 05/17/19   Fulp, Cammie, MD  atorvastatin (LIPITOR) 80 MG tablet Take 1 tablet (80 mg total) by mouth daily. 05/17/19   Fulp, Cammie, MD  cetirizine (ZYRTEC) 10 MG tablet Take 10 mg by mouth daily.    [provider]  cholecalciferol (VITAMIN D3) 25 MCG (1000 UT) tablet Take 1,000 Units by mouth daily.    [provider]  finasteride (PROSCAR) 5 MG tablet Take 1 tablet (5 mg total) by mouth daily. 06/10/19   Guilford Shi, MD  fluticasone (FLONASE) 50 MCG/ACT nasal spray Place 2 sprays into both nostrils daily. 08/18/18   [provider]  glucose blood test strip Check blood sugar twice per day. E.11.9 10/06/18   Scot Jun, FNP    Physical Exam: Vitals:   06/26/19 0130 06/26/19  0200 06/26/19 0412 06/26/19 0433  BP: 128/79 (!) 160/85 (!) 164/89   Pulse: (!) 56 (!) 51 (!) 51   Resp: 17 12 19    Temp:   97.7 F (36.5 C)   TempSrc:   Oral   SpO2: 97% 99% 98%   Weight:    93.6 kg  Height:    5\' 7"  (1.702 m)    Physical Exam  Constitutional: He is oriented to person, place, and time. He appears well-developed and well-nourished. No distress.  HENT:  Head: Normocephalic.  Eyes: Right eye exhibits no discharge. Left eye exhibits no discharge.  Neck: Neck supple.  Cardiovascular: Normal rate, regular rhythm and intact distal pulses.  Pulmonary/Chest: Effort normal and breath sounds normal.  No respiratory distress. He has no wheezes. He has no rales.  Abdominal: Soft. Bowel sounds are normal. There is no abdominal tenderness. There is no guarding.  Musculoskeletal:        General: No edema.  Neurological: He is alert and oriented to person, place, and time.  Skin: Skin is warm and dry. He is not diaphoretic.     Labs on Admission: I have personally reviewed following labs and imaging studies  CBC: Recent Labs  Lab 06/23/19 1320 06/25/19 1216 06/25/19 1741  WBC 5.2 5.2 5.5  NEUTROABS 3.3 3.8  --   HGB 7.9* 7.8* 7.8*  HCT 23.5* 23.6* 24.5*  MCV 98.3 98.3 102.1*  PLT 219 202 237   Basic Metabolic Panel: Recent Labs  Lab 06/23/19 1320 06/25/19 1216 06/25/19 1741  NA 142 138 137  K 3.6 3.6 3.7  CL 105 103 103  CO2 25 25 23   GLUCOSE 94 163* 101*  BUN 61* 62* 61*  CREATININE 4.93* 5.69* 5.92*  CALCIUM 9.2 9.0 9.1   GFR: Estimated Creatinine Clearance: 11.8 mL/min (A) (by C-G formula based on SCr of 5.92 mg/dL (H)). Liver Function Tests: Recent Labs  Lab 06/23/19 1320 06/25/19 1216 06/25/19 1741  AST 31 25 28   ALT 44 38 41  ALKPHOS 173* 178* 161*  BILITOT 0.6 0.6 0.5  PROT 7.4 7.2 6.8  ALBUMIN 3.9 3.8 3.7   No results for input(s): LIPASE, AMYLASE in the last 168 hours. No results for input(s): AMMONIA in the last 168 hours.  Coagulation Profile: No results for input(s): INR, PROTIME in the last 168 hours. Cardiac Enzymes: No results for input(s): CKTOTAL, CKMB, CKMBINDEX, TROPONINI in the last 168 hours. BNP (last 3 results) No results for input(s): PROBNP in the last 8760 hours. HbA1C: Recent Labs    06/26/19 0230  HGBA1C 5.2   CBG: No results for input(s): GLUCAP in the last 168 hours. Lipid Profile: No results for input(s): CHOL, HDL, LDLCALC, TRIG, CHOLHDL, LDLDIRECT in the last 72 hours. Thyroid Function Tests: No results for input(s): TSH, T4TOTAL, FREET4, T3FREE, THYROIDAB in the last 72 hours. Anemia Panel: No results for input(s): VITAMINB12, FOLATE, FERRITIN, TIBC, IRON, RETICCTPCT in the last 72 hours. Urine analysis:    Component Value Date/Time   COLORURINE RED (A) 06/04/2019 0449   APPEARANCEUR TURBID (A) 06/04/2019 0449   LABSPEC 1.015 06/04/2019 0449   PHURINE 6.5 06/04/2019 0449   GLUCOSEU NEGATIVE 06/04/2019 0449   HGBUR LARGE (A) 06/04/2019 0449   BILIRUBINUR NEGATIVE 06/04/2019 0449   KETONESUR NEGATIVE 06/04/2019 0449   PROTEINUR >300 (A) 06/04/2019 0449   NITRITE POSITIVE (A) 06/04/2019 0449   LEUKOCYTESUR TRACE (A) 06/04/2019 0449    Radiological Exams on Admission: US Renal  Result Date: 06/26/2019 CLINICAL DATA:  Acute kidney injury. EXAM: RENAL / URINARY TRACT ULTRASOUND COMPLETE COMPARISON:  06/04/2019 FINDINGS: Right Kidney: Renal measurements: 10.9 x 4.9 x 6.1 cm. = volume: 170 mL. Increased cortical echogenicity. No hydronephrosis. Multiple right kidney cysts. The dominant cyst is complex with solid and cystic components arising from the lower pole measuring 3.8 x 4.0 x 4.3 cm. Left Kidney: Renal measurements: 12.0 x 6.3 x 6.4 cm. = volume: 249.3 mL. Increased echogenicity. No hydronephrosis. Multiple lesions within the left kidney are again identified. The dominant lesion appears solid and arises from the upper pole of the left kidney measuring 7.6 x 5.5 x 6.5 cm.  Other lesions have imaging features of simple cysts. Bladder: There is marked enlargement of the  prostate gland which has mass effect upon the bladder. Other: None IMPRESSION: Bilateral increased renal cortical echogenicity compatible with chronic medical renal disease. No hydronephrosis. Multiple bilateral simple appearing and complex kidney lesions. Solid and cystic lesion within the inferior pole of the right kidney is noted measuring 4.3 cm. Cystic renal cell carcinoma cannot be excluded. A solid-appearing lesion arises from the upper pole of the left kidney measuring 7.6 cm. As mentioned on previous imaging findings are concerning for renal cell carcinoma. Prostate gland enlargement. Electronically Signed   By: Kerby Moors M.D.   On: 06/26/2019 04:38    Assessment/Plan Principal Problem:   AKI (acute kidney injury) ([place]) Active Problems:   BPH (benign prostatic hyperplasia)   Diabetes ([place])   Light chain myeloma ([facility])   CKD (chronic kidney disease), stage III   AKI on CKD 3/ progressively worsening renal function Likely related to light chain myeloma.  Creatinine 5.9.  Baseline creatinine 1.5-1.6 in March 2020 and has been progressively worsening since then.  Renal ultrasound without hydronephrosis. -Continue to monitor renal function -Avoid nephrotoxic agents/contrast -Urinalysis -Consult nephrology in a.m.  BPH No acute urinary retention noted, bladder scan with 200 cc urine.  Ultrasound showing marked enlargement of the prostate gland causing mass-effect upon the bladder. -Continue home finasteride -Bladder scans every 4 hours.  Will need catheterization if bladder scan showing greater than 350 cc urine. -Consult urology in the morning  Renal mass/concern for renal cell carcinoma -Oncology and urology follow-up  Light chain myeloma Hemoglobin 7.8, no significant change compared to recent labs.   -Will hold off giving blood transfusion at this time.  Please discuss with  oncology in the morning.  Type 2 diabetes -Check A1c.  Sliding scale insulin and CBG checks.  Hypertension Systolic currently in 007M. -Hydralazine PRN SBP >150  Pharmacy med rec pending.  DVT prophylaxis: SCDs Code Status: Patient wishes to be full code. Family Communication: No family available. Disposition Plan: Anticipate discharge in 1 to 2 days. Consults called: None Admission status: It is my clinical opinion that referral for OBSERVATION is reasonable and necessary in this patient based on the above information provided. The aforementioned taken together are felt to place the patient at high risk for further clinical deterioration. However it is anticipated that the patient may be medically stable for discharge from the hospital within 24 to 48 hours.  The medical decision making on this patient was of high complexity and the patient is at high risk for clinical deterioration, therefore this is a level 3 visit.  Shela Leff MD [facility] Pager [phone]  If 7PM-7AM, please contact night-coverage [email or web address] Password [email or web address]  06/26/2019, 5:59 AM

## 2019-06-26 NOTE — ED Notes (Signed)
RN msg MD for Catheterization orders for Bladder scan Q4H.RN informed to cath Pt if Bladder scan shows greater than 350 CC

## 2019-06-26 NOTE — Consult Note (Signed)
[facility]    NEPHROLOGY CONSULTATION NOTE  PATIENT ID:  [name] [name], DOB:  1946-06-25  HPI: The patient is a 73 y.o. year old male with a past medical history significant for BPH, paroxysmal atrial fibrillation, coronary artery disease, chronic diastolic congestive heart failure, chronic kidney disease stage III, colon cancer, light chain myeloma, renal cell carcinoma, COPD, type 2 diabetes, hypertension and hyperlipidemia was sent to the emergency department by his oncologist Dr. Velvet Bathe after he was noted to have worsening of his renal function with a creatinine of up to 5.7.  He apparently also had symptoms concerning for urinary retention.  He had been having difficulty emptying his bladder totally.  In the emergency department, he was noted to have a bladder scan with only 200 mL of urine.  He was diagnosed with myeloma last year, but had not followed up with oncology until recently.  He was also previously informed of his likely renal cell carcinoma, but has not followed up with a work-up and treatment for that with urology.  Renal consultation has been called for acute kidney injury and chronic kidney disease.  His baseline serum creatinine from 10/06/2018 was 1.6.  On June 03, 2019, his creatinine had worsened to 3.83.  On 06/23/2019, his creatinine was up to 4.93.   Past Medical History:  Diagnosis Date  . Abnormal CT of liver    a. nodular contour suggesting cirrhosis 05/2018.  [name] Abnormal LFTs (liver function tests)   . Anxiety   . Aortic root dilatation ([place])   . Ascending aortic aneurysm ([place])   . BPH with urinary obstruction   . Bradycardia   . 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 CAD (coronary artery disease)    a. 12/2015 NSTEMI/Cath (in setting of PAF):  LM nl, LAD 30p,  LCX 18m RCA  ok, AM 100%, RPDA1 40, RPDA 2 60 ->Med Rx.  . Childhood asthma   . Chronic diastolic CHF  (congestive heart failure) ([place]    a. 12/2015 Echo: EF 50-55%, mod AI, mod Ao root dil, mild MR, mod dil LA, mod RA.  . CKD (chronic kidney disease), stage III   . Colon cancer ([place]   . COPD (chronic obstructive pulmonary disease) ([place]    pt denies at preop  . Diabetes mellitus type II    diet controlled  . History of syncope   . Hyperlipidemia   . Hypertensive heart disease   . Kidney lump 04/04/2010   Overview:  Renal Cell Carcinoma   . [place] chain myeloma ([place]   . Moderate aortic insufficiency    a. 12/2015 Echo: Mod AI.  .Marland KitchenParoxysmal atrial fibrillation ([place]    a. 12/2015 started on Xarelto (CHA2DS2VASc = 4-5).  . Pneumonia   . Prostate cancer ([place]   . Renal cell carcinoma ([place]   . Sleep apnea    Does not like  CPAP  . Spinal stenosis in cervical region 10/12/2016  . Venous insufficiency     Past Surgical History:  Procedure Laterality Date  . BACK SURGERY    . CARDIAC CATHETERIZATION N/A 12/18/2015   Procedure: Left Heart Cath and Coronary Angiography;  Surgeon: DLeonie Man MD;  [facility];  Service: Cardiovascular;  Laterality: N/A;  . DIAGNOSTIC LAPAROSCOPY     partial colectomy  . LAPAROSCOPIC PARTIAL COLECTOMY Right 05/21/2018   Procedure: LAPAROSCOPIC RIGHT  COLECTOMY ERAS PATHWAY;  Surgeon: Leighton Ruff, MD;  Location: [facility];  Service: General;  Laterality: Right;  . [place]   "replaced a disc"    Family History  Problem Relation Age of Onset  . Emphysema Father   . Asthma Father   . Liver disease Father        tumor  . Heart disease Mother   . Hypertension Mother   . Asthma Sister   . Hypertension Son     Social History   Tobacco Use  . Smoking status: Former Smoker    Types: Cigars    Quit date: 09/02/1977    Years since quitting: 41.8  . Smokeless tobacco: Never Used  . Tobacco comment:    Substance Use Topics  . Alcohol use: Not Currently    Comment: "stopped drinking alcohol in ~ 1980; just drank a  little on the weekends when I did drink"  . Drug use: No    REVIEW OF SYSTEMS: General:  no fatigue, no weakness Head:  no headaches Eyes:  no blurred vision ENT:  no sore throat Neck:  no masses CV:  no chest pain, no orthopnea Lungs:  no shortness of breath, no cough GI:  no nausea or vomiting, no diarrhea GU:  no dysuria, positive hematuria  skin:  no rashes or lesions Neuro:  no focal numbness or weakness Psych:  no depression or anxiety MS: Positive back pain    PHYSICAL EXAM:  Vitals:   06/26/19 1130 06/26/19 1349  BP: 132/85 137/80  Pulse:  61  Resp: 18 18  Temp: 98 F (36.7 C) 99.3 F (37.4 C)  SpO2: 90% 97%   I/O last 3 completed shifts: In: -  Out: 350 [Urine:350]   General:  AAOx3 NAD HEENT: MMM [place] AT anicteric sclera Neck:  No JVD, no adenopathy CV:  Heart RRR  Lungs:  L/S CTA bilaterally Abd:  abd SNT/ND with normal BS GU:  Bladder non-palpable Extremities: +1 bilateral LE edema. Skin:  No skin rash Psych:  normal mood and affect Neuro:  no focal deficits   CURRENT MEDICATIONS:  . feeding supplement (ENSURE ENLIVE)  237 mL Oral BID BM  . finasteride  5 mg Oral Daily  . insulin aspart  0-5 Units Subcutaneous QHS  . insulin aspart  0-9 Units Subcutaneous TID WC     HOME MEDICATIONS:  Prior to Admission medications   Medication Sig Start Date End Date Taking? Authorizing Provider  amLODipine (NORVASC) 10 MG tablet Take 1 tablet (10 mg total) by mouth daily. 05/17/19   Fulp, Cammie, MD  atorvastatin (LIPITOR) 80 MG tablet Take 1 tablet (80 mg total) by mouth daily. 05/17/19   Fulp, Cammie, MD  cetirizine (ZYRTEC) 10 MG tablet Take 10 mg by mouth daily.    [provider]  cholecalciferol (VITAMIN D3) 25 MCG (1000 UT) tablet Take 1,000 Units by mouth daily.    [provider]  finasteride (PROSCAR) 5 MG tablet Take 1 tablet (5 mg total) by mouth daily. 06/10/19   Guilford Shi, MD  fluticasone (FLONASE) 50 MCG/ACT nasal  spray Place 2 sprays into both nostrils daily. 08/18/18   [provider]  glucose blood test strip Check blood sugar twice per day. E.11.9 10/06/18   Scot Jun, FNP       LABS:  CBC Latest Ref Rng & Units 06/26/2019 06/25/2019 06/25/2019  WBC 4.0 - 10.5 K/uL 5.2 5.5 5.2  Hemoglobin 13.0 - 17.0 g/dL 7.8(L) 7.8(L) 7.8(L)  Hematocrit 39.0 - 52.0 % 24.1(L) 24.5(L) 23.6(L)  Platelets 150 - 400 K/uL 211 226 202    CMP Latest Ref Rng & Units 06/26/2019 06/25/2019 06/25/2019  Glucose 70 - 99 mg/dL 81 101(H) 163(H)  BUN 8 - 23 mg/dL 62(H) 61(H) 62(H)  Creatinine 0.61 - 1.24 mg/dL 6.11(H) 5.92(H) 5.69(HH)  Sodium 135 - 145 mmol/L 139 137 138  Potassium 3.5 - 5.1 mmol/L 3.4(L) 3.7 3.6  Chloride 98 - 111 mmol/L 103 103 103  CO2 22 - 32 mmol/L 24 23 25   Calcium 8.9 - 10.3 mg/dL 9.3 9.1 9.0  Total Protein 6.5 - 8.1 g/dL - 6.8 7.2  Total Bilirubin 0.3 - 1.2 mg/dL - 0.5 0.6  Alkaline Phos 38 - 126 U/L - 161(H) 178(H)  AST 15 - 41 U/L - 28 25  ALT 0 - 44 U/L - 41 38    Lab Results  Component Value Date   PTH 131 (H) 06/16/2018   CALCIUM 9.3 06/26/2019   CAION 1.19 06/04/2019   PHOS 4.9 (H) 06/09/2019       Component Value Date/Time   COLORURINE AMBER (A) 06/26/2019 0936   APPEARANCEUR CLEAR 06/26/2019 0936   LABSPEC 1.011 06/26/2019 0936   PHURINE 6.0 06/26/2019 0936   GLUCOSEU NEGATIVE 06/26/2019 0936   HGBUR LARGE (A) 06/26/2019 0936   BILIRUBINUR NEGATIVE 06/26/2019 0936   KETONESUR NEGATIVE 06/26/2019 0936   PROTEINUR 100 (A) 06/26/2019 0936   NITRITE NEGATIVE 06/26/2019 0936   LEUKOCYTESUR NEGATIVE 06/26/2019 0936      Component Value Date/Time   PHART 7.389 10/17/2015 1426   PCO2ART 41.7 10/17/2015 1426   PO2ART 64.0 (L) 10/17/2015 1426   HCO3 25.2 (H) 10/17/2015 1426   TCO2 25 06/04/2019 0456   O2SAT 92.0 10/17/2015 1426       Component Value Date/Time   IRON 87 06/07/2019 0554   IRON 56 10/06/2018 1541   TIBC 267 06/07/2019 0554   TIBC 264  10/06/2018 1541   FERRITIN 340 (H) 06/07/2019 0554   FERRITIN 321 10/06/2018 1541   IRONPCTSAT 33 06/07/2019 0554   IRONPCTSAT 21 10/06/2018 1541       ASSESSMENT/PLAN:     1.  Chronic kidney disease stage III.  His baseline creatinine back in March 2020 was around 1.6.  He has progressively worsened.  I suspect his chronic kidney disease is on the basis of underlying myeloma kidney.  2.  Acute kidney injury.  With the rapid progression of his renal dysfunction, he most certainly is at risk for myeloma kidney.  We will administer gentle IV fluids for now.  In a patient without renal cell carcinoma, a kidney biopsy would be appropriate for diagnosis.  Will ask oncology to weigh in on the benefit of a kidney biopsy and possible diagnosis of myeloma kidney versus continuing his treatment plan for multiple myeloma without the biopsy.  Call placed to them today for a consult.  Has no uremic symptoms currently.  I suspect, however, that we will need to start dialysis in the next 24 to 48 hours.  He is agreeable, but has had significant issues with compliance in the past, which will make dialysis quite challenging.  3.  Multiple myeloma.  I have asked oncology to see.  Has not followed up with treatment plan in the past.  Suspect significant progression of his disease.  4.  Renal cell carcinoma.  He looks to have at least 2 potential lesions, with other smaller potential cystic lesions throughout the kidneys.  Has not followed up with urology.  We will also ask oncology to weigh in on prognosis.    5.  Anemia.  Will avoid ESA's for now.  6.  Hypertension.  Continue outpatient dose of amlodipine.  7.  Hyperlipidemia.  Continue atorvastatin.      [place], DO, [place]

## 2019-06-26 NOTE — Progress Notes (Signed)
Patient placed in observation after midnight, please see H&P.  Here after being sent in by Dr. Irene Limbo with worsening renal function as well as symptomatic anemia.  Will give 1 unit PRBCs and recheck Hgb.  Will monitor for volume overload but O2 sats 100% on RA.  Will get nephrology consult for worsening renal function-- no urgent indications for HD (no elevated K/volume overload).  At this point will not consult urology-- can place foley if having bladder issues and patient can follow outpatient. Eulogio Bear DO

## 2019-06-27 DIAGNOSIS — Z85038 Personal history of other malignant neoplasm of large intestine: Secondary | ICD-10-CM | POA: Diagnosis not present

## 2019-06-27 DIAGNOSIS — I252 Old myocardial infarction: Secondary | ICD-10-CM | POA: Diagnosis not present

## 2019-06-27 DIAGNOSIS — N186 End stage renal disease: Secondary | ICD-10-CM | POA: Diagnosis not present

## 2019-06-27 DIAGNOSIS — Z6832 Body mass index (BMI) 32.0-32.9, adult: Secondary | ICD-10-CM | POA: Diagnosis not present

## 2019-06-27 DIAGNOSIS — R338 Other retention of urine: Secondary | ICD-10-CM | POA: Diagnosis present

## 2019-06-27 DIAGNOSIS — I2510 Atherosclerotic heart disease of native coronary artery without angina pectoris: Secondary | ICD-10-CM | POA: Diagnosis present

## 2019-06-27 DIAGNOSIS — C642 Malignant neoplasm of left kidney, except renal pelvis: Secondary | ICD-10-CM | POA: Diagnosis present

## 2019-06-27 DIAGNOSIS — I480 Paroxysmal atrial fibrillation: Secondary | ICD-10-CM | POA: Diagnosis present

## 2019-06-27 DIAGNOSIS — E669 Obesity, unspecified: Secondary | ICD-10-CM | POA: Diagnosis present

## 2019-06-27 DIAGNOSIS — J449 Chronic obstructive pulmonary disease, unspecified: Secondary | ICD-10-CM | POA: Diagnosis present

## 2019-06-27 DIAGNOSIS — N179 Acute kidney failure, unspecified: Principal | ICD-10-CM

## 2019-06-27 DIAGNOSIS — D631 Anemia in chronic kidney disease: Secondary | ICD-10-CM | POA: Diagnosis present

## 2019-06-27 DIAGNOSIS — E1122 Type 2 diabetes mellitus with diabetic chronic kidney disease: Secondary | ICD-10-CM | POA: Diagnosis present

## 2019-06-27 DIAGNOSIS — R7989 Other specified abnormal findings of blood chemistry: Secondary | ICD-10-CM | POA: Diagnosis present

## 2019-06-27 DIAGNOSIS — N08 Glomerular disorders in diseases classified elsewhere: Secondary | ICD-10-CM | POA: Diagnosis not present

## 2019-06-27 DIAGNOSIS — D649 Anemia, unspecified: Secondary | ICD-10-CM | POA: Diagnosis not present

## 2019-06-27 DIAGNOSIS — N183 Chronic kidney disease, stage 3 unspecified: Secondary | ICD-10-CM | POA: Diagnosis present

## 2019-06-27 DIAGNOSIS — N401 Enlarged prostate with lower urinary tract symptoms: Secondary | ICD-10-CM | POA: Diagnosis present

## 2019-06-27 DIAGNOSIS — G4733 Obstructive sleep apnea (adult) (pediatric): Secondary | ICD-10-CM | POA: Diagnosis present

## 2019-06-27 DIAGNOSIS — I5032 Chronic diastolic (congestive) heart failure: Secondary | ICD-10-CM | POA: Diagnosis present

## 2019-06-27 DIAGNOSIS — C9000 Multiple myeloma not having achieved remission: Secondary | ICD-10-CM | POA: Diagnosis present

## 2019-06-27 DIAGNOSIS — Z20828 Contact with and (suspected) exposure to other viral communicable diseases: Secondary | ICD-10-CM | POA: Diagnosis present

## 2019-06-27 DIAGNOSIS — I712 Thoracic aortic aneurysm, without rupture: Secondary | ICD-10-CM | POA: Diagnosis present

## 2019-06-27 DIAGNOSIS — D630 Anemia in neoplastic disease: Secondary | ICD-10-CM | POA: Diagnosis present

## 2019-06-27 DIAGNOSIS — I130 Hypertensive heart and chronic kidney disease with heart failure and stage 1 through stage 4 chronic kidney disease, or unspecified chronic kidney disease: Secondary | ICD-10-CM | POA: Diagnosis present

## 2019-06-27 DIAGNOSIS — C641 Malignant neoplasm of right kidney, except renal pelvis: Secondary | ICD-10-CM | POA: Diagnosis present

## 2019-06-27 DIAGNOSIS — E785 Hyperlipidemia, unspecified: Secondary | ICD-10-CM | POA: Diagnosis present

## 2019-06-27 DIAGNOSIS — Z79899 Other long term (current) drug therapy: Secondary | ICD-10-CM | POA: Diagnosis not present

## 2019-06-27 LAB — TYPE AND SCREEN
ABO/RH(D): A POS
Antibody Screen: NEGATIVE
Unit division: 0

## 2019-06-27 LAB — CBC
HCT: 27.9 % — ABNORMAL LOW (ref 39.0–52.0)
Hemoglobin: 9.4 g/dL — ABNORMAL LOW (ref 13.0–17.0)
MCH: 32.3 pg (ref 26.0–34.0)
MCHC: 33.7 g/dL (ref 30.0–36.0)
MCV: 95.9 fL (ref 80.0–100.0)
Platelets: 161 10*3/uL (ref 150–400)
RBC: 2.91 MIL/uL — ABNORMAL LOW (ref 4.22–5.81)
RDW: 13.1 % (ref 11.5–15.5)
WBC: 4.8 10*3/uL (ref 4.0–10.5)
nRBC: 0 % (ref 0.0–0.2)

## 2019-06-27 LAB — GLUCOSE, CAPILLARY
Glucose-Capillary: 67 mg/dL — ABNORMAL LOW (ref 70–99)
Glucose-Capillary: 166 mg/dL — ABNORMAL HIGH (ref 70–99)
Glucose-Capillary: 86 mg/dL (ref 70–99)
Glucose-Capillary: 108 mg/dL — ABNORMAL HIGH (ref 70–99)

## 2019-06-27 LAB — BPAM RBC
Blood Product Expiration Date: 202011272359
ISSUE DATE / TIME: 202010241104
Unit Type and Rh: 6200

## 2019-06-27 LAB — BASIC METABOLIC PANEL
Anion gap: 13 (ref 5–15)
BUN: 64 mg/dL — ABNORMAL HIGH (ref 8–23)
CO2: 22 mmol/L (ref 22–32)
Calcium: 8.9 mg/dL (ref 8.9–10.3)
Chloride: 103 mmol/L (ref 98–111)
Creatinine, Ser: 5.59 mg/dL — ABNORMAL HIGH (ref 0.61–1.24)
GFR calc Af Amer: 11 mL/min — ABNORMAL LOW (ref >60)
GFR calc non Af Amer: 9 mL/min — ABNORMAL LOW (ref >60)
Glucose, Bld: 122 mg/dL — ABNORMAL HIGH (ref 70–99)
Potassium: 3.5 mmol/L (ref 3.5–5.1)
Sodium: 138 mmol/L (ref 135–145)

## 2019-06-27 MED ORDER — HEPARIN SODIUM (PORCINE) 5000 UNIT/ML IJ SOLN
5000.0000 [IU] | Freq: Three times a day (TID) | INTRAMUSCULAR | Status: DC
  Administered 2019-06-27 – 2019-06-28 (×3): 5000 [IU] via SUBCUTANEOUS
  Filled 2019-06-27 (×3): qty 1

## 2019-06-27 MED ORDER — SODIUM CHLORIDE 0.9 % IV SOLN
Freq: Every day | INTRAVENOUS | Status: DC
  Filled 2019-06-27 (×2): qty 200

## 2019-06-27 NOTE — Progress Notes (Signed)
Progress Note    [name] [name]  IOE:[id number] DOB: 1946/08/02  DOA: 06/25/2019 PCP: Scot Jun, FNP    Brief Narrative:    Medical records reviewed and are as summarized below:  [name] [name] is an 73 y.o. male with medical history significant of BPH, paroxysmal atrial fibrillation, CAD, chronic diastolic congestive heart failure, CKD stage III, colon cancer, light chain myeloma, renal cell carcinoma, COPD, type 2 diabetes, hypertension, hyperlipidemia being sent to the ED by his oncologist Dr. Irene Limbo after he was noted to have worsening of his renal function with creatinine up to 5.7.  He also reported symptoms concerning for urinary retention.  Patient states he has had problems emptying his bladder fully and is seen by urology.  He feels this problem is now getting worse.  It takes multiple attempts for him to fully empty his bladder.  States he was seen by urology a week ago as he was noticing blood in his urine.  States he is supposed to see nephrology for his kidney problem but does not have an appointment yet.  Assessment/Plan:   Principal Problem:   AKI (acute kidney injury) ([place]) Active Problems:   BPH (benign prostatic hyperplasia)   Diabetes ([place])   Light chain myeloma ([facility])   CKD (chronic kidney disease), stage III    AKI on CKD 3/ progressively worsening renal function with myeloma - Baseline creatinine 1.5-1.6 in March 2020 and has been progressively worsening since then.   -Renal ultrasound without hydronephrosis. 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  He has a good understanding of the possible toxicities complications and side effects of this treatment plan.  I have written the order for catheter placement and for apheresis  -currently  patient is in agreement for HD catheter to be placed in the AM  Anemia- symptomatic -transfuse 1 unit of PRBC  BPH No acute urinary retention noted, bladder scan with 200 cc urine.  Ultrasound showing marked enlargement of the prostate gland causing mass-effect upon the bladder. -Continue home finasteride -PRN bladder scans -I/O if needed  Renal mass/concern for renal cell carcinoma -Oncology and urology follow-up  Type 2 diabetes - A1c: 5.2 -not on any medication -hold on SSI for now  Hypertension -Hydralazine PRN SBP >150  obesity Body mass index is 32.32 kg/m.   Family Communication/Anticipated D/C date and plan/Code Status   DVT prophylaxis:heparin Code Status: Full Code.  Family Communication:  Disposition Plan: suspect will be in hospital for several days getting treatment for myeloma   Medical Consultants:    Oncology  nephrology     Subjective:   Agreeable to get HD catheter  Objective:    Vitals:   06/26/19 1349 06/26/19 2339 06/27/19 0624 06/27/19 0927  BP: 137/80 (!) 157/91 (!) 161/92 (!) 159/91  Pulse: 61 (!) 57 (!) 55   Resp: 18 16 18    Temp: 99.3 F (37.4 C) 97.9 F (36.6 C) 98.4 F (36.9 C)   TempSrc: Oral Oral Oral   SpO2: 97% 100% 99%   Weight:      Height:        Intake/Output Summary (Last 24 hours) at 06/27/2019 1243 Last data filed at 06/27/2019 0900 Gross per 24 hour  Intake 1539.9 ml  Output 2290 ml  Net -750.1 ml  Filed Weights   06/26/19 0433  Weight: 93.6 kg    Exam: In bed, NAD Appears stated age rrr No increased work of breathing A+Ox3  Data Reviewed:   I have personally reviewed following labs and imaging studies:  Labs: Labs show the following:   Basic Metabolic Panel: Recent Labs  Lab 06/23/19 1320 06/25/19 1216 06/25/19 1741 06/26/19 0615 06/27/19 0536  NA 142 138 137 139 138  K 3.6 3.6 3.7 3.4* 3.5  CL 105 103 103 103 103  CO2 25 25 23 24 22   GLUCOSE 94 163* 101* 81 122*  BUN  61* 62* 61* 62* 64*  CREATININE 4.93* 5.69* 5.92* 6.11* 5.59*  CALCIUM 9.2 9.0 9.1 9.3 8.9   GFR Estimated Creatinine Clearance: 12.5 mL/min (A) (by C-G formula based on SCr of 5.59 mg/dL (H)). Liver Function Tests: Recent Labs  Lab 06/23/19 1320 06/25/19 1216 06/25/19 1741  AST 31 25 28   ALT 44 38 41  ALKPHOS 173* 178* 161*  BILITOT 0.6 0.6 0.5  PROT 7.4 7.2 6.8  ALBUMIN 3.9 3.8 3.7   No results for input(s): LIPASE, AMYLASE in the last 168 hours. No results for input(s): AMMONIA in the last 168 hours. Coagulation profile No results for input(s): INR, PROTIME in the last 168 hours.  CBC: Recent Labs  Lab 06/23/19 1320 06/25/19 1216 06/25/19 1741 06/26/19 0615 06/26/19 1653 06/27/19 0738  WBC 5.2 5.2 5.5 5.2  --  4.8  NEUTROABS 3.3 3.8  --   --   --   --   HGB 7.9* 7.8* 7.8* 7.8* 9.2* 9.4*  HCT 23.5* 23.6* 24.5* 24.1* 26.9* 27.9*  MCV 98.3 98.3 102.1* 99.6  --  95.9  PLT 219 202 226 211  --  161   Cardiac Enzymes: No results for input(s): CKTOTAL, CKMB, CKMBINDEX, TROPONINI in the last 168 hours. BNP (last 3 results) No results for input(s): PROBNP in the last 8760 hours. CBG: Recent Labs  Lab 06/26/19 1557 06/26/19 2247 06/27/19 0445 06/27/19 0755 06/27/19 1121  GLUCAP 132* 73 67* 166* 86   D-Dimer: No results for input(s): DDIMER in the last 72 hours. Hgb A1c: Recent Labs    06/26/19 0230  HGBA1C 5.2   Lipid Profile: No results for input(s): CHOL, HDL, LDLCALC, TRIG, CHOLHDL, LDLDIRECT in the last 72 hours. Thyroid function studies: No results for input(s): TSH, T4TOTAL, T3FREE, THYROIDAB in the last 72 hours.  Invalid input(s): FREET3 Anemia work up: No results for input(s): VITAMINB12, FOLATE, FERRITIN, TIBC, IRON, RETICCTPCT in the last 72 hours. Sepsis Labs: Recent Labs  Lab 06/25/19 1216 06/25/19 1741 06/26/19 0615 06/27/19 0738  WBC 5.2 5.5 5.2 4.8    Microbiology Recent Results (from the past 240 hour(s))  SARS CORONAVIRUS 2  (TAT 6-24 HRS) Nasopharyngeal Nasopharyngeal Swab     Status: None   Collection Time: 06/26/19  2:48 AM   Specimen: Nasopharyngeal Swab  Result Value Ref Range Status   SARS Coronavirus 2 NEGATIVE NEGATIVE Final    Comment: (NOTE) SARS-CoV-2 target nucleic acids are NOT DETECTED. The SARS-CoV-2 RNA is generally detectable in upper and lower respiratory specimens during the acute phase of infection. Negative results do not preclude SARS-CoV-2 infection, do not rule out co-infections with other pathogens, and should not be used as the sole basis for treatment or other patient management decisions. Negative results must be combined with clinical observations, patient history, and epidemiological information. The expected result is Negative. Fact Sheet for Patients: SugarRoll.be Fact Sheet for Healthcare Providers:  https://www.woods-mathews.com/ This test is not yet approved or cleared by the [place] and  has been authorized for detection and/or diagnosis of SARS-CoV-2 by FDA under an Emergency Use Authorization (EUA). This EUA will remain  in effect (meaning this test can be used) for the duration of the COVID-19 declaration under Section 56 4(b)(1) of the Act, 21 U.S.C. section 360bbb-3(b)(1), unless the authorization is terminated or revoked sooner. Performed at [facility], [place] [address] [place], [place] [address]     Procedures and diagnostic studies:  US Renal  Result Date: 2019-07-18 CLINICAL DATA:  Acute kidney injury. EXAM: RENAL / URINARY TRACT ULTRASOUND COMPLETE COMPARISON:  06/04/2019 FINDINGS: Right Kidney: Renal measurements: 10.9 x 4.9 x 6.1 cm. = volume: 170 mL. Increased cortical echogenicity. No hydronephrosis. Multiple right kidney cysts. The dominant cyst is complex with solid and cystic components arising from the lower pole measuring 3.8 x 4.0 x 4.3 cm. Left Kidney: Renal measurements: 12.0 x 6.3 x 6.4  cm. = volume: 249.3 mL. Increased echogenicity. No hydronephrosis. Multiple lesions within the left kidney are again identified. The dominant lesion appears solid and arises from the upper pole of the left kidney measuring 7.6 x 5.5 x 6.5 cm. Other lesions have imaging features of simple cysts. Bladder: There is marked enlargement of the prostate gland which has mass effect upon the bladder. Other: None IMPRESSION: Bilateral increased renal cortical echogenicity compatible with chronic medical renal disease. No hydronephrosis. Multiple bilateral simple appearing and complex kidney lesions. Solid and cystic lesion within the inferior pole of the right kidney is noted measuring 4.3 cm. Cystic renal cell carcinoma cannot be excluded. A solid-appearing lesion arises from the upper pole of the left kidney measuring 7.6 cm. As mentioned on previous imaging findings are concerning for renal cell carcinoma. Prostate gland enlargement. Electronically Signed   By: Kerby Moors M.D.   On: 2019/07/18 04:38    Medications:   . feeding supplement (ENSURE ENLIVE)  237 mL Oral BID BM  . finasteride  5 mg Oral Daily  . insulin aspart  0-5 Units Subcutaneous QHS  . insulin aspart  0-9 Units Subcutaneous TID WC   Continuous Infusions: . sodium chloride 75 mL/hr at 06/27/19 0820  . [START ON 06/28/2019] therapeutic plasma exchange solution       LOS: 0 days   Geradine Girt  [facility]   How to contact the [facility] Attending or Consulting provider [place] or covering provider during after hours [place], for this patient?  1. Check the care team in [facility] and look for a) attending/consulting [facility] provider listed and b) the [facility] team listed 2. Log into [email or web address] and use [place]'s universal password to access. If you do not have the password, please contact the hospital operator. 3. Locate the [facility] provider you are looking for under [facility] and page to a number that you can be directly reached. 4.  If you still have difficulty reaching the provider, please page the [facility] (Director on Call) for the Hospitalists listed on amion for assistance.  06/27/2019, 12:43 PM

## 2019-06-27 NOTE — Progress Notes (Addendum)
Orders placed for pt to receive hemodialysis and for radiology to place a hemodialysis line. Paged MD Dr. Lurline Del, whom placed these orders, and informed her that the patient states he "does not want dialysis. I've taken care of my wife on dialysis and don't wan't that. That's too much for me" Dr. Jana Hakim aware and states she is "leaving order in and that when consent is obtained by radiology he can refuse then."  My nursing judgement is that the patient needs more education, especially regarding whether this is temporary or permanent. Will continue to follow up and advocate for patient education.  Dewaine Oats, RN

## 2019-06-27 NOTE — Progress Notes (Signed)
[name] [name]    NEPHROLOGY PROGRESS NOTE  SUBJECTIVE: Patient seen and examined earlier today.  Patient reports feeling generally well, and is quite apprehensive about cancer treatments in addition to dialysis as he reports he will not be able to do both.  He extensively elaborated on his experience with his wife and his overall experience with dialysis, and is confident that he cannot do both.  He sees no need for dialysis currently.  He currently denies any chest pain, shortness of breath, nausea, vomiting, diarrhea or dysuria.  All other review of systems are negative.  He does remember his conversation with the oncologist this morning.  OBJECTIVE:  Vitals:   06/27/19 0927 06/27/19 1359  BP: (!) 159/91 (!) 144/88  Pulse:  61  Resp:    Temp:  98 F (36.7 C)  SpO2:  100%    Intake/Output Summary (Last 24 hours) at 06/27/2019 1606 Last data filed at 06/27/2019 1419 Gross per 24 hour  Intake 1355.58 ml  Output 2450 ml  Net -1094.42 ml      General:  AAOx3 NAD HEENT: MMM [place] AT anicteric sclera Neck:  No JVD, no adenopathy CV:  Heart RRR  Lungs:  L/S CTA bilaterally Abd:  abd SNT/ND with normal BS GU:  Bladder non-palpable Extremities: Trace bilateral lower extremity edema Skin:  No skin rash  MEDICATIONS:  . feeding supplement (ENSURE ENLIVE)  237 mL Oral BID BM  . finasteride  5 mg Oral Daily  . heparin injection (subcutaneous)  5,000 Units Subcutaneous Q8H       LABS:   CBC Latest Ref Rng & Units 06/27/2019 06/26/2019 06/26/2019  WBC 4.0 - 10.5 K/uL 4.8 - 5.2  Hemoglobin 13.0 - 17.0 g/dL 9.4(L) 9.2(L) 7.8(L)  Hematocrit 39.0 - 52.0 % 27.9(L) 26.9(L) 24.1(L)  Platelets 150 - 400 K/uL 161 - 211    CMP Latest Ref Rng & Units 06/27/2019 06/26/2019 06/25/2019  Glucose 70 - 99 mg/dL 122(H) 81 101(H)  BUN 8 - 23 mg/dL 64(H) 62(H) 61(H)  Creatinine 0.61 - 1.24 mg/dL 5.59(H) 6.11(H) 5.92(H)  Sodium 135 - 145 mmol/L 138 139 137  Potassium 3.5 - 5.1  mmol/L 3.5 3.4(L) 3.7  Chloride 98 - 111 mmol/L 103 103 103  CO2 22 - 32 mmol/L 22 24 23   Calcium 8.9 - 10.3 mg/dL 8.9 9.3 9.1  Total Protein 6.5 - 8.1 g/dL - - 6.8  Total Bilirubin 0.3 - 1.2 mg/dL - - 0.5  Alkaline Phos 38 - 126 U/L - - 161(H)  AST 15 - 41 U/L - - 28  ALT 0 - 44 U/L - - 41    Lab Results  Component Value Date   PTH 131 (H) 06/16/2018   CALCIUM 8.9 06/27/2019   CAION 1.19 06/04/2019   PHOS 4.9 (H) 06/09/2019       Component Value Date/Time   COLORURINE AMBER (A) 06/26/2019 0936   APPEARANCEUR CLEAR 06/26/2019 0936   LABSPEC 1.011 06/26/2019 0936   PHURINE 6.0 06/26/2019 0936   GLUCOSEU NEGATIVE 06/26/2019 0936   HGBUR LARGE (A) 06/26/2019 0936   BILIRUBINUR NEGATIVE 06/26/2019 0936   KETONESUR NEGATIVE 06/26/2019 0936   PROTEINUR 100 (A) 06/26/2019 0936   NITRITE NEGATIVE 06/26/2019 0936   LEUKOCYTESUR NEGATIVE 06/26/2019 0936      Component Value Date/Time   PHART 7.389 10/17/2015 1426   PCO2ART 41.7 10/17/2015 1426   PO2ART 64.0 (L) 10/17/2015 1426   HCO3 25.2 (H) 10/17/2015 1426   TCO2 25 06/04/2019 0456  O2SAT 92.0 10/17/2015 1426       Component Value Date/Time   IRON 87 06/07/2019 0554   IRON 56 10/06/2018 1541   TIBC 267 06/07/2019 0554   TIBC 264 10/06/2018 1541   FERRITIN 340 (H) 06/07/2019 0554   FERRITIN 321 10/06/2018 1541   IRONPCTSAT 33 06/07/2019 0554   IRONPCTSAT 21 10/06/2018 1541       ASSESSMENT/PLAN:     1.  Chronic kidney disease stage III.  His baseline creatinine back in March 2020 was around 1.6.  He has progressively worsened.  I suspect his chronic kidney disease is on the basis of underlying myeloma kidney.  2.  Acute kidney injury.  With the rapid progression of his renal dysfunction, he most certainly is at risk for myeloma kidney.  We will administer gentle IV fluids for now.  In a patient without renal cell carcinoma, a kidney biopsy would be appropriate for diagnosis.    However, in his situation, it may be  not worth doing unless it significantly will alter the treatment plan.  Patient evaluated by oncology this morning, and plan is to proceed with plasmapheresis for possible myeloma kidney.  They have ordered plasma exchange for tomorrow, and catheter placement.  I am reluctant to start dialysis as he seems quite noncommittal about continuing this.  Fortunately, he has no uremic symptoms currently or urgent indication for HD.  He has had significant issues of noncompliance in the past.    3.  Multiple myeloma. Suspect significant progression of his disease.  Oncology input appreciated.  Plan is for plasmapheresis and chemotherapy.  4.  Renal cell carcinoma.  He looks to have at least 2 potential lesions, with other smaller potential cystic lesions throughout the kidneys.  Has not followed up with urology.   5.  Anemia.  Will avoid ESA's for now.  6.  Hypertension.  Continue outpatient dose of amlodipine.  7.  Hyperlipidemia.  Continue atorvastatin.  In my conversation with him, he was not willing to take on both cancer treatment and dialysis.  He reported that he would not do both.  He does not believe that there is anything wrong with his kidneys.  He reports that the Reita Cliche will and has healed him in the past, and that he does not need dialysis currently.  He does not believe that his cancers are terminal, and when I told him that his multiple myeloma has no cure, he reported that God will cure him.  I am unclear that he is able to completely understand the full risks and benefits of treatment versus no treatment, and based on my conversation with him, I highly doubt he will be compliant moving forward if the treatments in any way make him feel worse than not having treatments.  Regardless, he seems agreeable to the cancer treatment for now.  We will hold off as long as possible for treatment of dialysis.  Hopefully, his renal function will improve with the treatment and it will not be an issue.   However, I am not terribly optimistic.  At some point, it would make sense to have an overall prognosis for both his renal cell carcinoma and multiple myeloma, and have this conversation with him, although he was not open to hearing that he has cancer that is not curable.  At this point in time, I agree with treating the most potentially life-threatening issue, which is the probable myeloma kidney, and monitoring closely.  [place], DO, [place]

## 2019-06-27 NOTE — Progress Notes (Addendum)
Pt asking to be disconnected form his continuous fluids. He states "this is too much fluid for me and its makeing me feel full." Pt educated and disconnected. Urine output for the day so far is 1625 ml. Will continue to monitor.   Dewaine Oats, RN

## 2019-06-27 NOTE — Progress Notes (Signed)
According to patient, he takes tamsulosin 0.4 mg capsule daily and he is wondering why he is not getting that here at the hospital. Pt also takes Klor-Con M20 tablet twice a day and is wondering why he is not taking that here at the hospital.  Dewaine Oats, RN

## 2019-06-27 NOTE — Progress Notes (Signed)
Dr. Gaylene Brooks spoke with patient in room, she states that he "is very back and forth with his answers regarding hemodialysis" and that "we will hold off on dialysis orders today." Will continue to follow up and monitor kidney function.   Dewaine Oats, RN

## 2019-06-27 NOTE — Progress Notes (Signed)
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 is a 73 year old [place] man who was previously evaluated by my partner Dr. Irene Limbo for light chain multiple myeloma, kappa subtype.  The patient had a bone marrow biopsy in October 2020 showing 40% plasma cells, and was started on cyclophosphamide, dexamethasone, and bortezomib treatments, but after only 5 doses he dropped out of follow-up.  He was seen again more recently by Dr. Irene Limbo and treatment was discussed but given the patient's very high light chain levels, worsening renal function, and signs of urinary retention and abdominal distention, it was felt admission for stabilization was needed prior to starting therapy for his myeloma.  Currently the patient's creatinine and light chain levels have been as follows: Results for Mceachern, Oreste A (MRN [id number]) as of 06/27/2019 09:22  Ref. Range 06/23/2019 13:20 06/25/2019 12:16 06/25/2019 17:41 06/26/2019 06:15 06/27/2019 05:36  Creatinine Latest Ref Range: 0.61 - 1.24 mg/dL 4.93 (HH) 5.69 (HH) 5.92 (H) 6.11 (H) 5.59 (H)  Results for Peavy, Hammond A (MRN [id number]) as of 06/27/2019 09:22  Ref. Range 06/19/2018 14:15 07/24/2018 09:21 08/07/2018 10:15 06/06/2019 10:56 06/23/2019 13:20  Kappa free light chain Latest Ref Range: 3.3 - 19.4 mg/L 4,982.9 (H) 4,638.2 (H) 3,728.0 (H) 8,761.3 (H) 10,969.6 (H)   The patient was evaluated by renal yesterday, and they discussed the possibility of hemodialysis with the patient.  They also suggested myeloma kidney may be the main cause of the patient's  current renal deterioration and consideration of apheresis  INTERVAL HISTORY: I met with [name] in his hospital room 06/27/2019.  No family was present  REVIEW OF SYSTEMS: [name] generally feels well.  He tells me he is making urine.  He denies hematuria or dysuria.  He thinks his stream is "okay".  He denies pain overall.  He is ambulatory.  He has had no fever, rash, or bleeding.  He denies confusion, nausea, vomiting, cough, phlegm production, pleurisy, or worsening shortness of breath.  He is having normal bowel movements.  A detailed review of systems today was otherwise noncontributory  PAST MEDICAL HISTORY: Past Medical History:  Diagnosis Date   Abnormal CT of liver    a. nodular contour suggesting cirrhosis 05/2018.   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 12/2015 NSTEMI/Cath (in setting of PAF):  LM nl, LAD 30p,  LCX 21m RCA  ok, AM 100%, RPDA1 40, RPDA 2 60 ->Med Rx.   Childhood asthma    Chronic diastolic CHF (congestive heart failure) ([facility]    a. 12/2015 Echo: EF 50-55%, mod AI, mod Ao root dil, mild MR, mod dil LA, mod RA.   CKD (chronic kidney disease), stage III    Colon cancer (HCC)    COPD (chronic obstructive pulmonary disease) ([facility]  pt denies at preop   Diabetes mellitus type II    diet controlled   History of syncope    Hyperlipidemia    Hypertensive heart disease    Kidney lump 04/04/2010   Overview:  Renal Cell Carcinoma    Light chain myeloma (HCC)    Moderate aortic insufficiency    a. 12/2015 Echo: Mod AI.   Paroxysmal atrial fibrillation ([place])    a. 12/2015 started on Xarelto (CHA2DS2VASc = 4-5).   Pneumonia    Prostate cancer ([facility])    Renal  cell carcinoma ([facility] [facility])    Sleep apnea    Does not like  CPAP   Spinal stenosis in cervical region 10/12/2016   Venous insufficiency     PAST SURGICAL HISTORY: Past Surgical History:  Procedure Laterality Date   BACK SURGERY     CARDIAC CATHETERIZATION N/A 12/18/2015   Procedure: Left Heart Cath and Coronary Angiography;  Surgeon: Leonie Man, MD;  Location: [facility];  Service: Cardiovascular;  Laterality: N/A;   DIAGNOSTIC LAPAROSCOPY     partial colectomy   LAPAROSCOPIC PARTIAL COLECTOMY Right 05/21/2018   Procedure: LAPAROSCOPIC RIGHT  COLECTOMY ERAS PATHWAY;  Surgeon: Leighton Ruff, MD;  Location: [facility];  Service: General;  Laterality: Right;   [place]   "replaced a disc"    FAMILY HISTORY Family History  Problem Relation Age of Onset   Emphysema Father    Asthma Father    Liver disease Father        tumor   Heart disease Mother    Hypertension Mother    Asthma Sister    Hypertension Son     SOCIAL HISTORY:  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  His wife had end-stage renal disease and she was on dialysis more than 15 years.  He was her primary caregiver.  He is now widowed and lives by himself.  He tells me he does have a nursing service that comes by and helps him out.    ADVANCED DIRECTIVES: Not in place.  The patient tells me he intends to name his son [name] as his healthcare power of attorney   HEALTH MAINTENANCE: Social History   Tobacco Use   Smoking status: Former Smoker    Types: Cigars    Quit date: 09/02/1977    Years since quitting: 41.8   Smokeless tobacco: Never Used   Tobacco comment:    Substance Use Topics   Alcohol use: Not Currently    Comment: "stopped drinking alcohol in ~ 1980; just drank a little on the weekends when I did drink"   Drug use: No     Allergies  Allergen Reactions   Amoxicillin Other (See Comments)    Tolerates Unasyn. Can't move Has patient had a PCN reaction  causing immediate rash, facial/tongue/throat swelling, SOB or lightheadedness with hypotension: No Has patient had a PCN reaction causing severe rash involving mucus membranes or skin necrosis: No Has patient had a PCN reaction that required hospitalization No Has patient had a PCN reaction occurring within the last 10 years: No If all of the above answers are "NO", then may proceed with Cephalosporin use.    Penicillins Other (See Comments)    Tolerates Unasyn. Can't move/dizziness Has patient had a PCN reaction causing immediate rash, facial/tongue/throat swelling, SOB or lightheadedness with hypotension: No Has patient had a PCN reaction causing severe rash involving mucus membranes or skin necrosis: No Has patient had a PCN  reaction that required hospitalization No Has patient had a PCN reaction occurring within the last 10 years: No If all of the above answers are "NO", then may proceed with Cephalosporin use.  Other reaction(s): O    Current Facility-Administered Medications  Medication Dose Route Frequency Provider Last Rate Last Dose   0.45 % sodium chloride infusion   Intravenous Continuous Finnigan, Nancy A, DO 75 mL/hr at 06/27/19 0820     acetaminophen (TYLENOL) tablet 650 mg  650 mg Oral Q6H PRN Shela Leff, MD       Or   acetaminophen (TYLENOL) suppository 650 mg  650 mg Rectal Q6H PRN Shela Leff, MD       [START ON 06/28/2019] albumin human 25 % 50 g in sodium chloride 0.9 %   Dialysis Once in dialysis Vonnetta Akey, Virgie Dad, MD       feeding supplement (ENSURE ENLIVE) (ENSURE ENLIVE) liquid 237 mL  237 mL Oral BID BM Vann, Jessica U, DO   237 mL at 06/26/19 1359   finasteride (PROSCAR) tablet 5 mg  5 mg Oral Daily Shela Leff, MD   5 mg at 06/26/19 3716   hydrALAZINE (APRESOLINE) injection 5 mg  5 mg Intravenous Q4H PRN Shela Leff, MD       insulin aspart (novoLOG) injection 0-5 Units  0-5 Units Subcutaneous QHS Shela Leff, MD        insulin aspart (novoLOG) injection 0-9 Units  0-9 Units Subcutaneous TID WC Shela Leff, MD   1 Units at 06/26/19 1646    OBJECTIVE: Older African-American man in no acute distress  Vitals:   06/26/19 2339 06/27/19 0624  BP: (!) 157/91 (!) 161/92  Pulse: (!) 57 (!) 55  Resp: 16 18  Temp: 97.9 F (36.6 C) 98.4 F (36.9 C)  SpO2: 100% 99%     Body mass index is 32.32 kg/m.   Wt Readings from Last 3 Encounters:  06/26/19 206 lb 5.6 oz (93.6 kg)  06/23/19 211 lb 4.8 oz (95.8 kg)  06/18/19 218 lb (98.9 kg)      ECOG FS:1 - Symptomatic but completely ambulatory  Ocular: Sclerae unicteric, EOMs intact Lungs no rales or rhonchi Heart regular rate and rhythm Abd soft, nontender, positive bowel sounds MSK no focal spinal tenderness, no joint edema Neuro: non-focal, well-oriented, appropriate affect  LAB RESULTS:  CMP     Component Value Date/Time   NA 138 06/27/2019 0536   NA 140 11/04/2018 1515   K 3.5 06/27/2019 0536   CL 103 06/27/2019 0536   CO2 22 06/27/2019 0536   GLUCOSE 122 (H) 06/27/2019 0536   BUN 64 (H) 06/27/2019 0536   BUN 31 (H) 11/04/2018 1515   CREATININE 5.59 (H) 06/27/2019 0536   CREATININE 5.69 (HH) 06/25/2019 1216   CREATININE 1.01 03/29/2016 1032   CALCIUM 8.9 06/27/2019 0536   PROT 6.8 06/25/2019 1741   PROT 6.5 10/06/2018 1541   ALBUMIN 3.7 06/25/2019 1741   ALBUMIN 4.3 10/06/2018 1541   AST 28 06/25/2019 1741   AST 25 06/25/2019 1216   ALT 41 06/25/2019 1741   ALT 38 06/25/2019 1216   ALKPHOS 161 (H) 06/25/2019 1741   BILITOT 0.5 06/25/2019 1741   BILITOT 0.6 06/25/2019 1216   GFRNONAA 9 (L) 06/27/2019 0536   GFRNONAA 9 (L) 06/25/2019 1216   GFRAA 11 (L) 06/27/2019 0536   GFRAA 10 (L) 06/25/2019 1216    Lab Results  Component Value Date   TOTALPROTELP 6.5 08/07/2018   ALBUMINELP 2.8 (  L) 06/07/2018   A1GS 0.4 06/07/2018   A2GS 1.1 (H) 06/07/2018   BETS 0.8 06/07/2018   GAMS 1.3 06/07/2018   MSPIKE Not Observed 06/07/2018    SPEI Comment 06/07/2018    Lab Results  Component Value Date   KPAFRELGTCHN 10,969.6 (H) 06/23/2019   LAMBDASER 26.7 (H) 06/23/2019   KAPLAMBRATIO 410.85 (H) 06/23/2019    Lab Results  Component Value Date   WBC 4.8 06/27/2019   NEUTROABS 3.8 06/25/2019   HGB 9.4 (L) 06/27/2019   HCT 27.9 (L) 06/27/2019   MCV 95.9 06/27/2019   PLT 161 06/27/2019    @LASTCHEMISTRY @  No results found for: LABCA2  No components found for: EQASTM196  No results for input(s): INR in the last 168 hours.  No results found for: LABCA2  No results found for: CAN199  No results found for: QIW979  No results found for: GXQ119  No results found for: CA2729  No components found for: HGQUANT  Lab Results  Component Value Date   CEA1 1.97 07/03/2018   /  CEA  Date Value Ref Range Status  05/13/2018 1.9 0.0 - 4.7 ng/mL Final    Comment:    (NOTE)                             Nonsmokers          <3.9                             Smokers             <5.6 Roche [employer] Electrochemiluminescence Immunoassay (ECLIA) Values obtained with different assay methods or kits cannot be used interchangeably.  Results cannot be interpreted as absolute evidence of the presence or absence of malignant disease. Performed At: [facility] [place], [place] [id number] Rush Farmer MD YJ:[id number]    CEA [facility])  Date Value Ref Range Status  07/03/2018 1.97 0.00 - 5.00 ng/mL Final    Comment:    Performed at [facility], [place] [address] [place], [place] [address]     No results found for: AFPTUMOR  No results found for: [place]  No results found for: PSA1  Admission on 06/25/2019  Component Date Value Ref Range Status   Sodium 06/25/2019 137  135 - 145 mmol/L Final   Potassium 06/25/2019 3.7  3.5 - 5.1 mmol/L Final   Chloride 06/25/2019 103  98 - 111 mmol/L Final   CO2 06/25/2019 23  22 - 32 mmol/L Final   Glucose, Bld  06/25/2019 101* 70 - 99 mg/dL Final   BUN 06/25/2019 61* 8 - 23 mg/dL Final   Creatinine, Ser 06/25/2019 5.92* 0.61 - 1.24 mg/dL Final   Calcium 06/25/2019 9.1  8.9 - 10.3 mg/dL Final   Total Protein 06/25/2019 6.8  6.5 - 8.1 g/dL Final   Albumin 06/25/2019 3.7  3.5 - 5.0 g/dL Final   AST 06/25/2019 28  15 - 41 U/L Final   ALT 06/25/2019 41  0 - 44 U/L Final   Alkaline Phosphatase 06/25/2019 161* 38 - 126 U/L Final   Total Bilirubin 06/25/2019 0.5  0.3 - 1.2 mg/dL Final   GFR calc non Af Amer 06/25/2019 9* >60 mL/min Final   GFR calc Af Amer 06/25/2019 10* >60 mL/min Final   Anion gap 06/25/2019 11  5 - 15 Final   Performed  at [facility], [place] [address], [place], [place] [address]   WBC 06/25/2019 5.5  4.0 - 10.5 K/uL Final   RBC 06/25/2019 2.40* 4.22 - 5.81 MIL/uL Final   Hemoglobin 06/25/2019 7.8* 13.0 - 17.0 g/dL Final   HCT 06/25/2019 24.5* 39.0 - 52.0 % Final   MCV 06/25/2019 102.1* 80.0 - 100.0 fL Final   MCH 06/25/2019 32.5  26.0 - 34.0 pg Final   MCHC 06/25/2019 31.8  30.0 - 36.0 g/dL Final   RDW 06/25/2019 12.3  11.5 - 15.5 % Final   Platelets 06/25/2019 226  150 - 400 K/uL Final   nRBC 06/25/2019 0.0  0.0 - 0.2 % Final   Performed at [facility], [place] [address] [place], [place] [address]   ABO/RH(D) 06/25/2019 A POS   Final   Antibody Screen 06/25/2019 NEG   Final   Sample Expiration 06/25/2019 06/28/2019,2359   Final   Unit Number 06/25/2019 [id number]   Final   Blood Component Type 06/25/2019 RED CELLS,LR   Final   Unit division 06/25/2019 00   Final   Status of Unit 06/25/2019 ISSUED   Final   Transfusion Status 06/25/2019 OK TO TRANSFUSE   Final   Crossmatch Result 06/25/2019    Final                   Value:Compatible Performed at [facility], [place] [address] [place], [place] [address]    SARS Coronavirus 2 06/26/2019 NEGATIVE  NEGATIVE Final   Comment: (NOTE) SARS-CoV-2 target nucleic acids are NOT  DETECTED. The SARS-CoV-2 RNA is generally detectable in upper and lower respiratory specimens during the acute phase of infection. Negative results do not preclude SARS-CoV-2 infection, do not rule out co-infections with other pathogens, and should not be used as the sole basis for treatment or other patient management decisions. Negative results must be combined with clinical observations, patient history, and epidemiological information. The expected result is Negative. Fact Sheet for Patients: SugarRoll.be Fact Sheet for Healthcare Providers: https://www.woods-mathews.com/ This test is not yet approved or cleared by the [place] FDA and  has been authorized for detection and/or diagnosis of SARS-CoV-2 by FDA under an Emergency Use Authorization (EUA). This EUA will remain  in effect (meaning this test can be used) for the duration of the COVID-19 declaration under Section 56                          4(b)(1) of the Act, 21 U.S.C. section 360bbb-3(b)(1), unless the authorization is terminated or revoked sooner. Performed at [facility], [place] [address], [place], [place] [address]    Hgb A1c MFr Bld 06/26/2019 5.2  4.8 - 5.6 % Final   Comment: (NOTE) Pre diabetes:          5.7%-6.4% Diabetes:              >6.4% Glycemic control for   <7.0% adults with diabetes    Mean Plasma Glucose 06/26/2019 102.54  mg/dL Final   Performed at [facility], [place] [address] [place], [place] [address]   WBC 06/26/2019 5.2  4.0 - 10.5 K/uL Final   RBC 06/26/2019 2.42* 4.22 - 5.81 MIL/uL Final   Hemoglobin 06/26/2019 7.8* 13.0 - 17.0 g/dL Final   HCT 06/26/2019 24.1* 39.0 - 52.0 % Final   MCV 06/26/2019 99.6  80.0 - 100.0 fL Final   MCH 06/26/2019 32.2  26.0 - 34.0 pg Final   MCHC 06/26/2019 32.4  30.0 - 36.0 g/dL Final   RDW 06/26/2019 12.3  11.5 - 15.5 % Final   Platelets 06/26/2019 211  150 - 400 K/uL Final   nRBC  06/26/2019 0.0  0.0 - 0.2 % Final   Performed at [facility], [place] [address] [place], [place] [address]   Sodium 06/26/2019 139  135 - 145 mmol/L Final   Potassium 06/26/2019 3.4* 3.5 - 5.1 mmol/L Final   Chloride 06/26/2019 103  98 - 111 mmol/L Final   CO2 06/26/2019 24  22 - 32 mmol/L Final   Glucose, Bld 06/26/2019 81  70 - 99 mg/dL Final   BUN 06/26/2019 62* 8 - 23 mg/dL Final   Creatinine, Ser 06/26/2019 6.11* 0.61 - 1.24 mg/dL Final   Calcium 06/26/2019 9.3  8.9 - 10.3 mg/dL Final   GFR calc non Af Amer 06/26/2019 8* >60 mL/min Final   GFR calc Af Amer 06/26/2019 10* >60 mL/min Final   Anion gap 06/26/2019 12  5 - 15 Final   Performed at [facility] [address] [place], [place] [address]   Color, Urine 06/26/2019 AMBER* YELLOW Final   APPearance 06/26/2019 CLEAR  CLEAR Final   Specific Gravity, Urine 06/26/2019 1.011  1.005 - 1.030 Final   pH 06/26/2019 6.0  5.0 - 8.0 Final   Glucose, UA 06/26/2019 NEGATIVE  NEGATIVE mg/dL Final   Hgb urine dipstick 06/26/2019 LARGE* NEGATIVE Final   Bilirubin Urine 06/26/2019 NEGATIVE  NEGATIVE Final   Ketones, ur 06/26/2019 NEGATIVE  NEGATIVE mg/dL Final   Protein, ur 06/26/2019 100* NEGATIVE mg/dL Final   Nitrite 06/26/2019 NEGATIVE  NEGATIVE Final   Leukocytes,Ua 06/26/2019 NEGATIVE  NEGATIVE Final   RBC / HPF 06/26/2019 >50* 0 - 5 RBC/hpf Final   WBC, UA 06/26/2019 6-10  0 - 5 WBC/hpf Final   Bacteria, UA 06/26/2019 RARE* NONE SEEN Final   Performed at [facility], [place] [address] [place], [place] [address]   Glucose-Capillary 06/26/2019 121* 70 - 99 mg/dL Final   Comment 1 06/26/2019 Notify RN   Final   Order Confirmation 06/26/2019    Final                   Value:ORDER PROCESSED BY BLOOD BANK Performed at [facility], [place] [address], [place], [place] [address]    [place] / TIME 06/25/2019 [id number]   Final   Blood Product Unit Number 06/25/2019 [id number]    Final   PRODUCT CODE 06/25/2019 E0382V00   Final   Unit Type and Rh 06/25/2019 6200   Final   Blood Product Expiration Date 06/25/2019 [id number]   Final   Glucose-Capillary 06/26/2019 68* 70 - 99 mg/dL Final   Comment 1 06/26/2019 Notify RN   Final   Glucose-Capillary 06/26/2019 105* 70 - 99 mg/dL Final   Hemoglobin 06/26/2019 9.2* 13.0 - 17.0 g/dL Final   HCT 06/26/2019 26.9* 39.0 - 52.0 % Final   Performed at [facility], [place] [address], [place], [place] [address]   Glucose-Capillary 06/26/2019 132* 70 - 99 mg/dL Final   Comment 1 06/26/2019 Notify RN   Final   Sodium 06/27/2019 138  135 - 145 mmol/L Final   Potassium 06/27/2019 3.5  3.5 - 5.1 mmol/L Final   Chloride 06/27/2019 103  98 - 111 mmol/L Final   CO2 06/27/2019 22  22 - 32 mmol/L Final  Glucose, Bld 06/27/2019 122* 70 - 99 mg/dL Final   BUN 06/27/2019 64* 8 - 23 mg/dL Final   Creatinine, Ser 06/27/2019 5.59* 0.61 - 1.24 mg/dL Final   Calcium 06/27/2019 8.9  8.9 - 10.3 mg/dL Final   GFR calc non Af Amer 06/27/2019 9* >60 mL/min Final   GFR calc Af Amer 06/27/2019 11* >60 mL/min Final   Anion gap 06/27/2019 13  5 - 15 Final   Performed at [facility], [place] [address], [place], [place] [address]   Glucose-Capillary 06/26/2019 73  70 - 99 mg/dL Final   Glucose-Capillary 06/27/2019 67* 70 - 99 mg/dL Final   WBC 06/27/2019 4.8  4.0 - 10.5 K/uL Final   RBC 06/27/2019 2.91* 4.22 - 5.81 MIL/uL Final   Hemoglobin 06/27/2019 9.4* 13.0 - 17.0 g/dL Final   HCT 06/27/2019 27.9* 39.0 - 52.0 % Final   MCV 06/27/2019 95.9  80.0 - 100.0 fL Final   MCH 06/27/2019 32.3  26.0 - 34.0 pg Final   MCHC 06/27/2019 33.7  30.0 - 36.0 g/dL Final   RDW 06/27/2019 13.1  11.5 - 15.5 % Final   Platelets 06/27/2019 161  150 - 400 K/uL Final   nRBC 06/27/2019 0.0  0.0 - 0.2 % Final   Performed at [facility], [place] [address] [place], [place] [address]   Glucose-Capillary 06/27/2019 166* 70 - 99  mg/dL Final  Infusion on 06/25/2019  Component Date Value Ref Range Status   Order Confirmation 06/25/2019    Final                   Value:ORDER PROCESSED BY BLOOD BANK Performed at [facility], [place] [address], [place], [place] [address]   Appointment on 06/25/2019  Component Date Value Ref Range Status   WBC 06/25/2019 5.2  4.0 - 10.5 K/uL Final   RBC 06/25/2019 2.40* 4.22 - 5.81 MIL/uL Final   Hemoglobin 06/25/2019 7.8* 13.0 - 17.0 g/dL Final   HCT 06/25/2019 23.6* 39.0 - 52.0 % Final   MCV 06/25/2019 98.3  80.0 - 100.0 fL Final   MCH 06/25/2019 32.5  26.0 - 34.0 pg Final   MCHC 06/25/2019 33.1  30.0 - 36.0 g/dL Final   RDW 06/25/2019 12.3  11.5 - 15.5 % Final   Platelets 06/25/2019 202  150 - 400 K/uL Final   nRBC 06/25/2019 0.0  0.0 - 0.2 % Final   Neutrophils Relative % 06/25/2019 73  % Final   Neutro Abs 06/25/2019 3.8  1.7 - 7.7 K/uL Final   Lymphocytes Relative 06/25/2019 17  % Final   Lymphs Abs 06/25/2019 0.9  0.7 - 4.0 K/uL Final   Monocytes Relative 06/25/2019 8  % Final   Monocytes Absolute 06/25/2019 0.4  0.1 - 1.0 K/uL Final   Eosinophils Relative 06/25/2019 2  % Final   Eosinophils Absolute 06/25/2019 0.1  0.0 - 0.5 K/uL Final   Basophils Relative 06/25/2019 0  % Final   Basophils Absolute 06/25/2019 0.0  0.0 - 0.1 K/uL Final   Immature Granulocytes 06/25/2019 0  % Final   Abs Immature Granulocytes 06/25/2019 0.01  0.00 - 0.07 K/uL Final   Performed at [facility], [place] [address], [place], [place] [address]   Sodium 06/25/2019 138  135 - 145 mmol/L Final   Potassium 06/25/2019 3.6  3.5 - 5.1 mmol/L Final   Chloride 06/25/2019 103  98 - 111 mmol/L Final   CO2 06/25/2019  25  22 - 32 mmol/L Final   Glucose, Bld 06/25/2019 163* 70 - 99 mg/dL Final   BUN 06/25/2019 62* 8 - 23 mg/dL Final   Creatinine 06/25/2019 5.69* 0.61 - 1.24 mg/dL Final   CRITICAL RESULT CALLED TO, READ BACK BY AND  VERIFIED WITH: RN NICOLE AMMONS @1 .12pm.ku    Calcium 06/25/2019 9.0  8.9 - 10.3 mg/dL Final   Total Protein 06/25/2019 7.2  6.5 - 8.1 g/dL Final   Albumin 06/25/2019 3.8  3.5 - 5.0 g/dL Final   AST 06/25/2019 25  15 - 41 U/L Final   ALT 06/25/2019 38  0 - 44 U/L Final   Alkaline Phosphatase 06/25/2019 178* 38 - 126 U/L Final   Total Bilirubin 06/25/2019 0.6  0.3 - 1.2 mg/dL Final   GFR, Est Non Af Am 06/25/2019 9* >60 mL/min Final   GFR, Est AFR Am 06/25/2019 10* >60 mL/min Final   Anion gap 06/25/2019 10  5 - 15 Final   Performed at [facility], [place] [address], [place], [place] [address]   ABO/RH(D) 06/25/2019 A POS   Final   Antibody Screen 06/25/2019 NEG   Final   Sample Expiration 06/25/2019 06/28/2019,2359   Final   Unit Number 06/25/2019 [id number]   Final   Blood Component Type 06/25/2019 RBC LR PHER1   Final   Unit division 06/25/2019 00   Final   Status of Unit 06/25/2019 ALLOCATED   Final   Transfusion Status 06/25/2019 OK TO TRANSFUSE   Final   Crossmatch Result 06/25/2019    Final                   Value:Compatible Performed at [facility], [place] [address], [place],  [address]    ISSUE DATE / TIME 06/25/2019 [id number]   Final   Blood Product Unit Number 06/25/2019 [id number]   Final   PRODUCT CODE 06/25/2019 E4532V00   Final   Unit Type and Rh 06/25/2019 6200   Final   Blood Product Expiration Date 06/25/2019 [id number]   Final    (this displays the last labs from the last 3 days)  Lab Results  Component Value Date   TOTALPROTELP 6.5 08/07/2018   ALBUMINELP 2.8 (L) 06/07/2018   A1GS 0.4 06/07/2018   A2GS 1.1 (H) 06/07/2018   BETS 0.8 06/07/2018   GAMS 1.3 06/07/2018   MSPIKE Not Observed 06/07/2018   SPEI Comment 06/07/2018   (this displays SPEP labs)  Lab Results  Component Value Date   KPAFRELGTCHN 10,969.6 (H) 06/23/2019   LAMBDASER 26.7 (H) 06/23/2019   KAPLAMBRATIO  410.85 (H) 06/23/2019   (kappa/lambda light chains)  No results found for: HGBA, HGBA2QUANT, HGBFQUANT, HGBSQUAN (Hemoglobinopathy evaluation)   Lab Results  Component Value Date   LDH 206 (H) 06/19/2018    Lab Results  Component Value Date   IRON 87 06/07/2019   TIBC 267 06/07/2019   IRONPCTSAT 33 06/07/2019   (Iron and TIBC)  Lab Results  Component Value Date   FERRITIN 340 (H) 06/07/2019    Urinalysis    Component Value Date/Time   COLORURINE AMBER (A) 06/26/2019 0936   APPEARANCEUR CLEAR 06/26/2019 0936   LABSPEC 1.011 06/26/2019 0936   PHURINE 6.0 06/26/2019 0936   GLUCOSEU NEGATIVE 06/26/2019 0936   HGBUR LARGE (A) 06/26/2019 0936   BILIRUBINUR NEGATIVE 06/26/2019 [place] 06/26/2019 0936   PROTEINUR 100 (A) 06/26/2019 0936   NITRITE NEGATIVE 06/26/2019 0936  LEUKOCYTESUR NEGATIVE 06/26/2019 6237     STUDIES: Ct Abdomen Pelvis Wo Contrast  Result Date: 06/01/2019 CLINICAL DATA:  Status post partial right colectomy 05/21/2018. Incisional hernia evaluation. Additional history of prostate cancer. EXAM: CT ABDOMEN AND PELVIS WITHOUT CONTRAST TECHNIQUE: Multidetector CT imaging of the abdomen and pelvis was performed following the standard protocol without IV contrast. Creatinine was obtained on site at [facility] at [address] Results: Creatinine 4.1 mg/dL. COMPARISON:  08/19/2018 PET-CT.  06/05/2018 CT abdomen/pelvis. FINDINGS: Lower chest: No significant pulmonary nodules or acute consolidative airspace disease. Cardiomegaly. Coronary atherosclerosis. Hepatobiliary: Normal liver size. Simple 1.0 cm right liver dome cyst. Several subcentimeter hypodense liver lesions scattered throughout the liver, too small to characterize, not appreciably changed since 06/05/2018 CT, probably benign. No appreciable new liver lesions. Normal gallbladder with no radiopaque cholelithiasis. No biliary ductal dilatation. Pancreas: Normal, with no mass  or duct dilation. Spleen: Normal size. No mass. Adrenals/Urinary Tract: Normal adrenals. No renal stones. No hydronephrosis. Dominant 7.2 x 6.2 cm posterior upper left renal cortical mass with density 29 HU (series 2/image 23), increased from 4.6 x 4.8 cm on 06/05/2018 CT. Mixed attenuation 5.1 x 4.2 cm renal cortical mass in the medial lower right kidney (series 2/image 37), previously 5.0 x 4.0 cm on 06/05/2018 CT, not significantly changed. Numerous additional hyperdense renal cortical masses in both kidneys, largest 3.5 cm in the interpolar right kidney (series 2/image 30), increased from 2.8 cm. Several simple renal cysts scattered in both kidneys, largest 4.2 cm in medial interpolar left kidney. Chronic mild diffuse bladder wall thickening is unchanged. No significant bladder distention. No bladder stones. Stomach/Bowel: Normal non-distended stomach. Status post subtotal right hemicolectomy with ileocolic anastomosis in the right abdomen. No small bowel dilatation or wall thickening. Oral contrast transits to the rectum. Mild scattered diverticulosis throughout the remnant large-bowel, with no definite large bowel wall thickening or acute pericolonic fat stranding. There is prominent diastasis of the midline supraumbilical ventral abdominal wall (series 2/image 27) containing a portion of the transverse colon. There is a tiny umbilical hernia containing a portion of a small bowel loop. Vascular/Lymphatic: Atherosclerotic abdominal aorta with ectatic 2.8 cm infrarenal abdominal aorta. 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 No pathologically enlarged lymph nodes in the abdomen or pelvis. Reproductive: Markedly enlarged prostate with mass-effect on the bladder base with nonspecific internal prostatic calcifications, not appreciably changed. Other: No pneumoperitoneum, ascites or focal fluid collection. Musculoskeletal: No aggressive appearing focal osseous lesions.  Marked thoracolumbar spondylosis. IMPRESSION: 1. Significant interval growth of 7.2 cm posterior upper left renal cortical mass, which was hypermetabolic on 62/83/1517 PET-CT, and which was enhancing on 03/24/2018 CT abdomen/pelvis. 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 Urology consultation advised. 2. Numerous additional complex hyperdense bilateral renal masses are incompletely evaluated on this noncontrast study. Additional renal cell carcinomas cannot be excluded. MRI abdomen without and with IV contrast is indicated for further evaluation. 3. Prominent diastasis of the midline supraumbilical ventral abdominal wall, containing a portion of the transverse colon. 4. Separate tiny umbilical hernia contains a portion of a small bowel loop. No acute bowel complication. 5. Cardiomegaly.  Coronary atherosclerosis. 6. Chronic mild diffuse bladder wall thickening, probably due to chronic bladder outlet obstruction by the markedly enlarged prostate. No hydronephrosis. 7. 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 Recommend follow-up aortic ultrasound in 5 years. This recommendation follows ACR consensus guidelines: White Paper of the ACR Incidental Findings  Committee II on Vascular Findings. J Am Coll Radiol 2013; 10:789-794. 8.  Aortic Atherosclerosis (ICD10-I70.0). These results will be called to the ordering clinician or representative by the Radiologist Assistant, and communication documented in the PACS or zVision Dashboard. Patient's acute renal failure as detected by creatinine testing prior to the scan was called by the CT technologist to triage RN Abigail Butts in the provider's office. Electronically Signed   By: Ilona Sorrel M.D.   On: 06/01/2019 14:00   US Renal  Result Date: 06/26/2019 CLINICAL DATA:  Acute kidney injury. EXAM: RENAL / URINARY TRACT ULTRASOUND COMPLETE COMPARISON:  06/04/2019 FINDINGS: Right Kidney: Renal measurements: 10.9 x 4.9 x 6.1  cm. = volume: 170 mL. Increased cortical echogenicity. No hydronephrosis. Multiple right kidney cysts. The dominant cyst is complex with solid and cystic components arising from the lower pole measuring 3.8 x 4.0 x 4.3 cm. Left Kidney: Renal measurements: 12.0 x 6.3 x 6.4 cm. = volume: 249.3 mL. Increased echogenicity. No hydronephrosis. Multiple lesions within the left kidney are again identified. The dominant lesion appears solid and arises from the upper pole of the left kidney measuring 7.6 x 5.5 x 6.5 cm. Other lesions have imaging features of simple cysts. Bladder: There is marked enlargement of the prostate gland which has mass effect upon the bladder. Other: None IMPRESSION: Bilateral increased renal cortical echogenicity compatible with chronic medical renal disease. No hydronephrosis. Multiple bilateral simple appearing and complex kidney lesions. Solid and cystic lesion within the inferior pole of the right kidney is noted measuring 4.3 cm. Cystic renal cell carcinoma cannot be excluded. A solid-appearing lesion arises from the upper pole of the left kidney measuring 7.6 cm. As mentioned on previous imaging findings are concerning for renal cell carcinoma. Prostate gland enlargement. Electronically Signed   By: Kerby Moors M.D.   On: 06/26/2019 04:38   US Renal  Result Date: 06/04/2019 CLINICAL DATA:  Acute kidney injury. EXAM: RENAL / URINARY TRACT ULTRASOUND COMPLETE COMPARISON:  Abdominopelvic CT 06/01/2019. Renal ultrasound 06/06/2018. PET-CT 08/19/2018. FINDINGS: Right Kidney: Renal measurements: 13.0 x 5.6 x 4.9 cm = volume: 185.1 mL. There is a simple appearing cystic lesion in the upper interpolar region measuring 3.0 x 2.6 x 2.7 cm. In the lower pole, there is a mildly septated cystic lesion measuring 4.2 x 3.3 x 3.7 cm. Numerous other smaller cystic lesions are present. No hydronephrosis. Left Kidney: Renal measurements: 10.5 x 6.2 x 4.4 cm = volume: 151 mL. The known solid mass in the  upper pole which was hypermetabolic on previous PET-CT shows continued enlargement as seen on earlier CT. This now measures 7.3 x 6.0 x 8.2 cm (4.9 x 4.5 x 4.5 cm on previous ultrasound of 1 year ago). Color Doppler analysis of this lesion is suboptimal. There are additional cystic lesions in the interpolar region, measuring up to 4.2 and 3.5 cm respectively. No hydronephrosis. Bladder: Median lobe prostate hypertrophy protrudes into the bladder lumen as seen on previous studies. There is a separate filling defect which was incompletely evaluated by this examination, probably reflecting blood clot. Patient was unable to complete the examination, needing to void. IMPRESSION: 1. Known solid mass involving the upper pole of the left kidney shows continued enlargement as seen on earlier CT scan. This remains consistent with neoplasm, likely renal cell carcinoma. Please refer to recommendations on CT scan performed 06/01/2019. 2. Additional simple and complex cystic lesions bilaterally. No hydronephrosis. 3. Filling defect in the urinary bladder lumen adjacent to an enlarged median  lobe, probably blood clot. This was incompletely evaluated by this examination. Electronically Signed   By: Richardean Sale M.D.   On: 06/04/2019 11:38   Dg Foot 2 Views Left  Result Date: 06/03/2019 CLINICAL DATA:  One week of great toe swelling and pain EXAM: LEFT FOOT - 2 VIEW COMPARISON:  None. FINDINGS: Soft tissue swelling medial to the first MTP joint with soft tissue calcification. Mild hallux valgus. No definite juxta articular or marginal erosion. IMPRESSION: Swelling and soft tissue calcification at the first MTP joint, suspect gout. Electronically Signed   By: Monte Fantasia M.D.   On: 06/03/2019 05:40    ELIGIBLE FOR AVAILABLE RESEARCH PROTOCOL: no  ASSESSMENT: 73 y.o. 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 has multiple reasons for kidney failure, including a 4.9 cm left kidney solid mass which is felt likely to be a renal cell carcinoma, history of BPH with obstructive nephropathy, and of course diabetes and hypertension.  He was evaluated by renal yesterday and their feeling was the patient was at high risk for myeloma kidney and might need to undergo hemodialysis at least temporarily in the near future.    I concur with that assessment.  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  He has a good understanding of the possible toxicities complications and side effects of this treatment plan.  I have written the order for catheter placement and for apheresis however I am told there is no availability today.  Hopefully we can get started on this procedure tomorrow.  The patient tells me he intends to name his son [name] as healthcare power of attorney and I will also place a consult to the chaplain service to facilitate that.  I will alert Dr. Irene Limbo, the patient's hematologist to this admission.  Please do not hesitate to contact me if you have any further questions or concerns  Chauncey Cruel, MD   06/27/2019 9:22 AM Medical Oncology and Hematology [facility] [address] [place], [place] [address] Tel. [phone]    Fax. [phone]

## 2019-06-28 ENCOUNTER — Inpatient Hospital Stay (HOSPITAL_COMMUNITY): Payer: Medicare Other

## 2019-06-28 ENCOUNTER — Encounter (HOSPITAL_COMMUNITY): Payer: Self-pay | Admitting: Interventional Radiology

## 2019-06-28 DIAGNOSIS — N08 Glomerular disorders in diseases classified elsewhere: Secondary | ICD-10-CM

## 2019-06-28 DIAGNOSIS — C9000 Multiple myeloma not having achieved remission: Secondary | ICD-10-CM

## 2019-06-28 HISTORY — PX: IR FLUORO GUIDE CV LINE RIGHT: IMG2283

## 2019-06-28 HISTORY — PX: IR US GUIDE VASC ACCESS RIGHT: IMG2390

## 2019-06-28 LAB — MULTIPLE MYELOMA PANEL, SERUM
Albumin SerPl Elph-Mcnc: 3.9 g/dL (ref 2.9–4.4)
Albumin/Glob SerPl: 1.2 (ref 0.7–1.7)
Alpha 1: 0.2 g/dL (ref 0.0–0.4)
Alpha2 Glob SerPl Elph-Mcnc: 0.9 g/dL (ref 0.4–1.0)
B-Globulin SerPl Elph-Mcnc: 0.8 g/dL (ref 0.7–1.3)
Gamma Glob SerPl Elph-Mcnc: 1.4 g/dL (ref 0.4–1.8)
Globulin, Total: 3.3 g/dL (ref 2.2–3.9)
IgA: 68 mg/dL (ref 61–437)
IgG (Immunoglobin G), Serum: 1300 mg/dL (ref 603–1613)
IgM (Immunoglobulin M), Srm: 22 mg/dL (ref 15–143)
Total Protein ELP: 7.2 g/dL (ref 6.0–8.5)

## 2019-06-28 LAB — CBC
HCT: 25.9 % — ABNORMAL LOW (ref 39.0–52.0)
Hemoglobin: 8.8 g/dL — ABNORMAL LOW (ref 13.0–17.0)
MCH: 31.9 pg (ref 26.0–34.0)
MCHC: 34 g/dL (ref 30.0–36.0)
MCV: 93.8 fL (ref 80.0–100.0)
Platelets: 186 10*3/uL (ref 150–400)
RBC: 2.76 MIL/uL — ABNORMAL LOW (ref 4.22–5.81)
RDW: 13.1 % (ref 11.5–15.5)
WBC: 4.5 10*3/uL (ref 4.0–10.5)
nRBC: 0 % (ref 0.0–0.2)

## 2019-06-28 LAB — RENAL FUNCTION PANEL
Albumin: 3.4 g/dL — ABNORMAL LOW (ref 3.5–5.0)
Anion gap: 14 (ref 5–15)
BUN: 71 mg/dL — ABNORMAL HIGH (ref 8–23)
CO2: 23 mmol/L (ref 22–32)
Calcium: 9.2 mg/dL (ref 8.9–10.3)
Chloride: 101 mmol/L (ref 98–111)
Creatinine, Ser: 5.56 mg/dL — ABNORMAL HIGH (ref 0.61–1.24)
GFR calc Af Amer: 11 mL/min — ABNORMAL LOW (ref >60)
GFR calc non Af Amer: 9 mL/min — ABNORMAL LOW (ref >60)
Glucose, Bld: 85 mg/dL (ref 70–99)
Phosphorus: 5.2 mg/dL — ABNORMAL HIGH (ref 2.5–4.6)
Potassium: 3.2 mmol/L — ABNORMAL LOW (ref 3.5–5.1)
Sodium: 138 mmol/L (ref 135–145)

## 2019-06-28 LAB — PROTIME-INR
INR: 0.9 (ref 0.8–1.2)
Prothrombin Time: 12.5 seconds (ref 11.4–15.2)

## 2019-06-28 MED ORDER — LIDOCAINE HCL (PF) 1 % IJ SOLN
INTRAMUSCULAR | Status: AC | PRN
  Administered 2019-06-28: 10 mL

## 2019-06-28 MED ORDER — HEPARIN SODIUM (PORCINE) 5000 UNIT/ML IJ SOLN
5000.0000 [IU] | Freq: Three times a day (TID) | INTRAMUSCULAR | Status: DC
  Administered 2019-06-29 – 2019-06-30 (×3): 5000 [IU] via SUBCUTANEOUS
  Filled 2019-06-28 (×7): qty 1

## 2019-06-28 MED ORDER — MIDAZOLAM HCL 2 MG/2ML IJ SOLN
INTRAMUSCULAR | Status: AC
  Filled 2019-06-28: qty 2

## 2019-06-28 MED ORDER — HEPARIN SODIUM (PORCINE) 1000 UNIT/ML IJ SOLN
INTRAMUSCULAR | Status: AC
  Administered 2019-06-28: 16:00:00 3.8 mL
  Filled 2019-06-28: qty 1

## 2019-06-28 MED ORDER — BORTEZOMIB CHEMO SQ INJECTION 3.5 MG (2.5MG/ML): 1.3000 mg/m2 | Freq: Once | INTRAMUSCULAR | Status: DC

## 2019-06-28 MED ORDER — MIDAZOLAM HCL 2 MG/2ML IJ SOLN
INTRAMUSCULAR | Status: AC | PRN
  Administered 2019-06-28: 1 mg via INTRAVENOUS

## 2019-06-28 MED ORDER — LIDOCAINE-EPINEPHRINE (PF) 1 %-1:200000 IJ SOLN
INTRAMUSCULAR | Status: AC
  Filled 2019-06-28: qty 30

## 2019-06-28 MED ORDER — POTASSIUM CHLORIDE CRYS ER 20 MEQ PO TBCR
20.0000 meq | EXTENDED_RELEASE_TABLET | Freq: Once | ORAL | Status: AC
  Administered 2019-06-28: 09:00:00 20 meq via ORAL
  Filled 2019-06-28: qty 1

## 2019-06-28 MED ORDER — VANCOMYCIN HCL IN DEXTROSE 1-5 GM/200ML-% IV SOLN
INTRAVENOUS | Status: AC
  Filled 2019-06-28: qty 200

## 2019-06-28 MED ORDER — FENTANYL CITRATE (PF) 100 MCG/2ML IJ SOLN
INTRAMUSCULAR | Status: AC
  Filled 2019-06-28: qty 2

## 2019-06-28 MED ORDER — FENTANYL CITRATE (PF) 100 MCG/2ML IJ SOLN
INTRAMUSCULAR | Status: AC | PRN
  Administered 2019-06-28: 25 ug via INTRAVENOUS

## 2019-06-28 NOTE — Progress Notes (Addendum)
HEMATOLOGY-ONCOLOGY PROGRESS NOTE  SUBJECTIVE: The patient was seen at the cancer center last week and had a creatinine up to 5.7.  He also reported symptoms of urinary retention.  Due to his worsening renal function, he was sent to the emergency room for further evaluation and management.  The patient reports that he is feeling better today.  Nephrology has been following closely with no immediate plans for dialysis.  He was seen by hematology over the weekend who recommended pheresis which is currently pending.  He will need to have a catheter placement prior to pheresis to be done by interventional radiology.  He denies pain.  Denies chest pain shortness of breath.  Denies abdominal pain, nausea, vomiting.  The patient has expressed that he is willing to take what ever treatments are offered to him including dialysis and chemotherapy.  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 Diagnosis   Multiple myeloma ([place])   07/03/2018 -  Chemotherapy   The patient had palonosetron (ALOXI) injection 0.25 mg, 0.25 mg, Intravenous,  Once, 2 of 6 cycles Administration: 0.25 mg (07/03/2018), 0.25 mg (07/17/2018), 0.25 mg (07/24/2018), 0.25 mg (08/07/2018), 0.25 mg (09/18/2018) bortezomib SQ (VELCADE) chemo injection 3.25 mg, 1.5 mg/m2 = 3.25 mg, Subcutaneous,  Once, 2 of 6 cycles Administration: 3.25 mg (07/03/2018), 3.25 mg (07/17/2018), 3.25 mg (07/24/2018), 3.25 mg (08/07/2018), 3.25 mg (09/18/2018) cyclophosphamide (CYTOXAN) 660 mg in sodium chloride 0.9 % 250 mL chemo infusion, 300 mg/m2 = 660 mg, Intravenous,  Once, 2 of 6 cycles Administration: 660 mg (07/03/2018), 660 mg (07/17/2018), 660 mg (07/24/2018), 660 mg (08/07/2018), 660 mg (09/18/2018)  for chemotherapy treatment.       REVIEW OF SYSTEMS:   As noted in the HPI.  I have reviewed the past medical history, past surgical history, social history and family history with the patient and they are unchanged from previous  note.   PHYSICAL EXAMINATION: ECOG PERFORMANCE STATUS: 2 - Symptomatic, <50% confined to bed  Vitals:   06/28/19 1024 06/28/19 1220  BP: 134/90 (!) 144/88  Pulse: (!) 55 60  Resp: 16 18  Temp: 97.8 F (36.6 C) 97.7 F (36.5 C)  SpO2: 97% 100%   Filed Weights   06/26/19 0433  Weight: 206 lb 5.6 oz (93.6 kg)    Intake/Output from previous day: 10/25 0701 - 10/26 0700 In: 405.7 [I.V.:405.7] Out: 2350 [Urine:2350]  GENERAL:alert, no distress and comfortable SKIN: skin color, texture, turgor are normal, no rashes or significant lesions EYES: normal, Conjunctiva are pink and non-injected, sclera clear OROPHARYNX:no exudate, no erythema and lips, buccal mucosa, and tongue normal  NECK: supple, thyroid normal size, non-tender, without nodularity LYMPH:  no palpable lymphadenopathy in the cervical, axillary or inguinal LUNGS: clear to auscultation and percussion with normal breathing effort HEART: regular rate & rhythm and no murmurs and trace pedal edema ABDOMEN:abdomen soft, non-tender and normal bowel sounds Musculoskeletal:no cyanosis of digits and no clubbing  NEURO: alert & oriented x 3 with fluent speech, no focal motor/sensory deficits  LABORATORY DATA:  I have reviewed the data as listed CMP Latest Ref Rng & Units 06/28/2019 06/27/2019 06/26/2019  Glucose 70 - 99 mg/dL 85 122(H) 81  BUN 8 - 23 mg/dL 71(H) 64(H) 62(H)  Creatinine 0.61 - 1.24 mg/dL 5.56(H) 5.59(H) 6.11(H)  Sodium 135 - 145 mmol/L 138 138 139  Potassium 3.5 - 5.1 mmol/L 3.2(L) 3.5 3.4(L)  Chloride 98 - 111 mmol/L 101 103 103  CO2 22 - 32 mmol/L 23 22 24  Calcium 8.9 - 10.3 mg/dL 9.2 8.9 9.3  Total Protein 6.5 - 8.1 g/dL - - -  Total Bilirubin 0.3 - 1.2 mg/dL - - -  Alkaline Phos 38 - 126 U/L - - -  AST 15 - 41 U/L - - -  ALT 0 - 44 U/L - - -    Lab Results  Component Value Date   WBC 4.5 06/28/2019   HGB 8.8 (L) 06/28/2019   HCT 25.9 (L) 06/28/2019   MCV 93.8 06/28/2019   PLT 186 06/28/2019    NEUTROABS 3.8 06/25/2019    Ct Abdomen Pelvis Wo Contrast  Result Date: 06/01/2019 CLINICAL DATA:  Status post partial right colectomy 05/21/2018. Incisional hernia evaluation. Additional history of prostate cancer. EXAM: CT ABDOMEN AND PELVIS WITHOUT CONTRAST TECHNIQUE: Multidetector CT imaging of the abdomen and pelvis was performed following the standard protocol without IV contrast. Creatinine was obtained on site at [facility] at [address] Results: Creatinine 4.1 mg/dL. COMPARISON:  08/19/2018 PET-CT.  06/05/2018 CT abdomen/pelvis. FINDINGS: Lower chest: No significant pulmonary nodules or acute consolidative airspace disease. Cardiomegaly. Coronary atherosclerosis. Hepatobiliary: Normal liver size. Simple 1.0 cm right liver dome cyst. Several subcentimeter hypodense liver lesions scattered throughout the liver, too small to characterize, not appreciably changed since 06/05/2018 CT, probably benign. No appreciable new liver lesions. Normal gallbladder with no radiopaque cholelithiasis. No biliary ductal dilatation. Pancreas: Normal, with no mass or duct dilation. Spleen: Normal size. No mass. Adrenals/Urinary Tract: Normal adrenals. No renal stones. No hydronephrosis. Dominant 7.2 x 6.2 cm posterior upper left renal cortical mass with density 29 HU (series 2/image 23), increased from 4.6 x 4.8 cm on 06/05/2018 CT. Mixed attenuation 5.1 x 4.2 cm renal cortical mass in the medial lower right kidney (series 2/image 37), previously 5.0 x 4.0 cm on 06/05/2018 CT, not significantly changed. Numerous additional hyperdense renal cortical masses in both kidneys, largest 3.5 cm in the interpolar right kidney (series 2/image 30), increased from 2.8 cm. Several simple renal cysts scattered in both kidneys, largest 4.2 cm in medial interpolar left kidney. Chronic mild diffuse bladder wall thickening is unchanged. No significant bladder distention. No bladder stones. Stomach/Bowel: Normal  non-distended stomach. Status post subtotal right hemicolectomy with ileocolic anastomosis in the right abdomen. No small bowel dilatation or wall thickening. Oral contrast transits to the rectum. Mild scattered diverticulosis throughout the remnant large-bowel, with no definite large bowel wall thickening or acute pericolonic fat stranding. There is prominent diastasis of the midline supraumbilical ventral abdominal wall (series 2/image 27) containing a portion of the transverse colon. There is a tiny umbilical hernia containing a portion of a small bowel loop. Vascular/Lymphatic: Atherosclerotic abdominal aorta with ectatic 2.8 cm infrarenal abdominal aorta. 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 No pathologically enlarged lymph nodes in the abdomen or pelvis. Reproductive: Markedly enlarged prostate with mass-effect on the bladder base with nonspecific internal prostatic calcifications, not appreciably changed. Other: No pneumoperitoneum, ascites or focal fluid collection. Musculoskeletal: No aggressive appearing focal osseous lesions. Marked thoracolumbar spondylosis. IMPRESSION: 1. Significant interval growth of 7.2 cm posterior upper left renal cortical mass, which was hypermetabolic on 01/65/5374 PET-CT, and which was enhancing on 03/24/2018 CT abdomen/pelvis. 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 Urology consultation advised. 2. Numerous additional complex hyperdense bilateral renal masses are incompletely evaluated on this noncontrast study. Additional renal cell carcinomas cannot be excluded. MRI abdomen without and with IV contrast is indicated for further evaluation. 3.  Prominent diastasis of the midline supraumbilical ventral abdominal wall, containing a portion of the transverse colon. 4. Separate tiny umbilical hernia contains a portion of a small bowel loop. No acute bowel complication. 5. Cardiomegaly.  Coronary  atherosclerosis. 6. Chronic mild diffuse bladder wall thickening, probably due to chronic bladder outlet obstruction by the markedly enlarged prostate. No hydronephrosis. 7. 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 Recommend follow-up aortic ultrasound in 5 years. This recommendation follows ACR consensus guidelines: White Paper of the ACR Incidental Findings Committee II on Vascular Findings. J Am Coll Radiol 2013; 10:789-794. 8.  Aortic Atherosclerosis (ICD10-I70.0). These results will be called to the ordering clinician or representative by the Radiologist Assistant, and communication documented in the PACS or zVision Dashboard. Patient's acute renal failure as detected by creatinine testing prior to the scan was called by the CT technologist to triage RN Abigail Butts in the provider's office. Electronically Signed   By: Ilona Sorrel M.D.   On: 06/01/2019 14:00   US Renal  Result Date: 06/26/2019 CLINICAL DATA:  Acute kidney injury. EXAM: RENAL / URINARY TRACT ULTRASOUND COMPLETE COMPARISON:  06/04/2019 FINDINGS: Right Kidney: Renal measurements: 10.9 x 4.9 x 6.1 cm. = volume: 170 mL. Increased cortical echogenicity. No hydronephrosis. Multiple right kidney cysts. The dominant cyst is complex with solid and cystic components arising from the lower pole measuring 3.8 x 4.0 x 4.3 cm. Left Kidney: Renal measurements: 12.0 x 6.3 x 6.4 cm. = volume: 249.3 mL. Increased echogenicity. No hydronephrosis. Multiple lesions within the left kidney are again identified. The dominant lesion appears solid and arises from the upper pole of the left kidney measuring 7.6 x 5.5 x 6.5 cm. Other lesions have imaging features of simple cysts. Bladder: There is marked enlargement of the prostate gland which has mass effect upon the bladder. Other: None IMPRESSION: Bilateral increased renal cortical echogenicity compatible with chronic medical renal disease. No hydronephrosis. Multiple bilateral simple  appearing and complex kidney lesions. Solid and cystic lesion within the inferior pole of the right kidney is noted measuring 4.3 cm. Cystic renal cell carcinoma cannot be excluded. A solid-appearing lesion arises from the upper pole of the left kidney measuring 7.6 cm. As mentioned on previous imaging findings are concerning for renal cell carcinoma. Prostate gland enlargement. Electronically Signed   By: Kerby Moors M.D.   On: 06/26/2019 04:38   US Renal  Result Date: 06/04/2019 CLINICAL DATA:  Acute kidney injury. EXAM: RENAL / URINARY TRACT ULTRASOUND COMPLETE COMPARISON:  Abdominopelvic CT 06/01/2019. Renal ultrasound 06/06/2018. PET-CT 08/19/2018. FINDINGS: Right Kidney: Renal measurements: 13.0 x 5.6 x 4.9 cm = volume: 185.1 mL. There is a simple appearing cystic lesion in the upper interpolar region measuring 3.0 x 2.6 x 2.7 cm. In the lower pole, there is a mildly septated cystic lesion measuring 4.2 x 3.3 x 3.7 cm. Numerous other smaller cystic lesions are present. No hydronephrosis. Left Kidney: Renal measurements: 10.5 x 6.2 x 4.4 cm = volume: 151 mL. The known solid mass in the upper pole which was hypermetabolic on previous PET-CT shows continued enlargement as seen on earlier CT. This now measures 7.3 x 6.0 x 8.2 cm (4.9 x 4.5 x 4.5 cm on previous ultrasound of 1 year ago). Color Doppler analysis of this lesion is suboptimal. There are additional cystic lesions in the interpolar region, measuring up to 4.2 and 3.5 cm respectively. No hydronephrosis. Bladder: Median lobe prostate hypertrophy protrudes into the bladder lumen as seen on previous  studies. There is a separate filling defect which was incompletely evaluated by this examination, probably reflecting blood clot. Patient was unable to complete the examination, needing to void. IMPRESSION: 1. Known solid mass involving the upper pole of the left kidney shows continued enlargement as seen on earlier CT scan. This remains consistent with  neoplasm, likely renal cell carcinoma. Please refer to recommendations on CT scan performed 06/01/2019. 2. Additional simple and complex cystic lesions bilaterally. No hydronephrosis. 3. Filling defect in the urinary bladder lumen adjacent to an enlarged median lobe, probably blood clot. This was incompletely evaluated by this examination. Electronically Signed   By: Richardean Sale M.D.   On: 06/04/2019 11:38   Dg Foot 2 Views Left  Result Date: 06/03/2019 CLINICAL DATA:  One week of great toe swelling and pain EXAM: LEFT FOOT - 2 VIEW COMPARISON:  None. FINDINGS: Soft tissue swelling medial to the first MTP joint with soft tissue calcification. Mild hallux valgus. No definite juxta articular or marginal erosion. IMPRESSION: Swelling and soft tissue calcification at the first MTP joint, suspect gout. Electronically Signed   By: Monte Fantasia M.D.   On: 06/03/2019 05:40    ASSESSMENT AND PLAN: 73 y.o. male with  1. Newly diagnosed Light chain myeloma Component     Latest Ref Rng & Units 06/07/2018  Hep B Core Ab, IgM     Negative Negative  Kappa free light chain     3.3 - 19.4 mg/L 7,790.4 (H)  Lamda free light chains     5.7 - 26.3 mg/L 34.3 (H)  Kappa, lamda light chain ratio     0.26 - 1.65 227.13 (H)   06/10/18 Bone Survey revealed Slightly patchy ill-defined appearance of the thoracic spine some which is due to overlap of adjacent ribs and lung limiting assessment. The possibility of subtle lytic abnormalities is not excluded as a result. CT may help for better assessment. Otherwise, no aggressive nor suspicious lytic abnormalities are identified.   06/12/18 BM Bx revealed hypercellular bone marrow with trilineage hematopoiesis and 40% kappa restricted plasma cells   2. ARF on CKD -- multiple etiologies - myeloma, sepsis, BPH etc. 3.Imaging concern for Renal cell carcinoma  06/06/18 US Renal revealed Probable medical renal disease changes of the kidneys. BILATERAL simple renal  cysts. Additional solid mass at upper pole LEFT kidney 4.9 cm greatest size most likely representing a solid renal neoplasm, demonstrated enhancement on a prior CT exam of 03/24/2018 and now demonstrates an interval increase in size. Additional complicated cystic lesions at the inferior pole of RIGHT kidney and upper pole LEFT kidney, indeterminate, cannot exclude renal neoplasms; consider MR characterization. Marked prostatic enlargement   4. Anemia - likely primarily due to myeloma but also from sepsis, ckd  PLAN -The patient received prior chemotherapy and stopped secondary to side effects.  Due to his poor renal function currently, will be challenging to administer any additional chemotherapy.  We will need to optimize his renal function before we can consider.  May need to consider weekly Velcade dosing.  We will see how he does with pheresis that is planned for tomorrow. Appreciate nephrology input.  No urgent need for dialysis at this time. -transfuse prn to maintain hgb >8  -Continue outpatient follow up with urology for concerns of renal cancer -The patient's son was at the bedside and understands the plan.  The patient states that he would consider any treatment that is offered to him.   LOS: 1 day   Erasmo Downer  Curcio, DNP, AGPCNP-BC, AOCNP 06/28/19  ADDENDUM  .Patient was Personally and independently interviewed, examined and relevant elements of the history of present illness were reviewed in details and an assessment and plan was created. All elements of the patient's history of present illness , assessment and plan were discussed in details with Mikey Bussing, DNP. The above documentation reflects our combined findings assessment and plan.  -transfuse prn for hgb<8 -will start with [place] Velcade weekly from tomorrow and add Cytoxan next week if stable. -was start on plasmapheresis per Dr Curlene Labrum --difficult endpoint given low likelihood of rapid renal improvement and rapid decrease  in light chain burden with treatment. -high likelihood of needing HD and increased risk of TLS with myeloma treatment. -continue to discuss goals of care with patient since he is quite unreliable and expect questionable followup as previously.    Sullivan Lone MD MS

## 2019-06-28 NOTE — Progress Notes (Signed)
Chaplain responded to spiritual consult.  Patient very engaging and spiritual man. Father of six children. He is a widower.  His son [name] [name] is a Chartered certified accountant at [employer]. Patient says his son makes all of his medical decisions.  Chaplain explained the benefits of Advanced Directive and patient will discuss the document further with his son today. Rev. Tamsen Snider  Pager [phone]

## 2019-06-28 NOTE — Procedures (Signed)
  Procedure: R IJ tunneled 23cm Palindrome HD/pheresis cath placement   EBL:   minimal Complications:  none immediate  See full dictation in [facility].  Dillard Cannon MD Main # [phone] Pager  [phone]

## 2019-06-28 NOTE — Progress Notes (Signed)
Subjective:  No acute events overnight. Patient feels well this morning. Does not like being confined to hospital room and hopes to walk the halls later. He is aware of plan for plasmapheresis after having catheter placed and trying to avoid HD if possible. Having good urine output. Endorses good appetite. Denies headaches, chest pain, shortness of breath, n/v.   Objective Vital signs in last 24 hours: Vitals:   06/27/19 2204 06/27/19 2300 06/28/19 0433 06/28/19 1024  BP: (!) 174/90 (!) 154/84 (!) 149/84 134/90  Pulse: 60  (!) 56 (!) 55  Resp:   18 16  Temp: 98.1 F (36.7 C)  98.2 F (36.8 C) 97.8 F (36.6 C)  TempSrc: Oral  Oral Oral  SpO2: 100%  98% 97%  Weight:      Height:       Weight change:   Intake/Output Summary (Last 24 hours) at 06/28/2019 1111 Last data filed at 06/28/2019 0853 Gross per 24 hour  Intake 405.68 ml  Output 2225 ml  Net -1819.32 ml    Assessment/ Plan: 1.Chronic kidney disease stage III. His baseline creatinine back in March 2020 was around 1.6. He has progressively worsened. I suspect his chronic kidney disease is on the basis of underlying myeloma kidney.  2. Acute kidney injury. With the rapid progression of his renal dysfunction, he most certainly is at risk for myeloma kidney. In a patient without renal cell carcinoma, a kidney biopsy would be appropriate for diagnosis. However, in his situation, it may be not worth doing unless it significantly will alter the treatment plan.  Patient evaluated by oncology, and plan is to proceed with plasmapheresis for possible myeloma kidney which will take place tomorrow. His creatinine has remained stable. No uremic symptoms or urgent indication for HD. Trying to avoid dialysis if possible given his issues of noncompliance and poor insight into his clinical status and prognosis. Hopefully renal function will improve with plasmapheresis.   3. Multiple myeloma. Suspect significant progression of his disease.   Oncology input appreciated.  Plan is for plasmapheresis and chemotherapy.   4. Renal cell carcinoma. He looks to have at least 2 potential lesions, with other smaller potential cystic lesions throughout the kidneys. Has not followed up with urology.   5. Anemia. Will avoid ESA's for now.   6. Hypertension. Continue outpatient dose of amlodipine.  7. Hyperlipidemia. Continue atorvastatin.  Delice Bison    Labs: Basic Metabolic Panel: Recent Labs  Lab 06/26/19 0615 06/27/19 0536 06/28/19 0535  NA 139 138 138  K 3.4* 3.5 3.2*  CL 103 103 101  CO2 24 22 23   GLUCOSE 81 122* 85  BUN 62* 64* 71*  CREATININE 6.11* 5.59* 5.56*  CALCIUM 9.3 8.9 9.2  PHOS  --   --  5.2*   Liver Function Tests: Recent Labs  Lab 06/23/19 1320 06/25/19 1216 06/25/19 1741 06/28/19 0535  AST 31 25 28   --   ALT 44 38 41  --   ALKPHOS 173* 178* 161*  --   BILITOT 0.6 0.6 0.5  --   PROT 7.4 7.2 6.8  --   ALBUMIN 3.9 3.8 3.7 3.4*   No results for input(s): LIPASE, AMYLASE in the last 168 hours. No results for input(s): AMMONIA in the last 168 hours. CBC: Recent Labs  Lab 06/23/19 1320  06/25/19 1216 06/25/19 1741 06/26/19 0615 06/26/19 1653 06/27/19 0738 06/28/19 0535  WBC 5.2   < > 5.2 5.5 5.2  --  4.8 4.5  NEUTROABS  3.3  --  3.8  --   --   --   --   --   HGB 7.9*   < > 7.8* 7.8* 7.8* 9.2* 9.4* 8.8*  HCT 23.5*  --  23.6* 24.5* 24.1* 26.9* 27.9* 25.9*  MCV 98.3  --  98.3 102.1* 99.6  --  95.9 93.8  PLT 219   < > 202 226 211  --  161 186   < > = values in this interval not displayed.   Cardiac Enzymes: No results for input(s): CKTOTAL, CKMB, CKMBINDEX, TROPONINI in the last 168 hours. CBG: Recent Labs  Lab 06/26/19 2247 06/27/19 0445 06/27/19 0755 06/27/19 1121 06/27/19 2214  GLUCAP 73 67* 166* 86 108*    Iron Studies: No results for input(s): IRON, TIBC, TRANSFERRIN, FERRITIN in the last 72 hours. Studies/Results: No results  found. Medications: Infusions: . sodium chloride Stopped (06/27/19 1321)  . therapeutic plasma exchange solution      Scheduled Medications: . feeding supplement (ENSURE ENLIVE)  237 mL Oral BID BM  . finasteride  5 mg Oral Daily  . [START ON 06/29/2019] heparin injection (subcutaneous)  5,000 Units Subcutaneous Q8H    have reviewed scheduled and prn medications.  Physical Exam: General: awake, alert, sitting up in bed in NAD Heart: RRR Lungs: CTAB Abdomen: soft, non-tender, non-distended  Extremities: Trace BLE edema   06/28/2019,11:11 AM  LOS: 1 day

## 2019-06-28 NOTE — Progress Notes (Signed)
Progress Note    [name] [name]  NGE:[id number] DOB: Feb 10, 1946  DOA: 06/25/2019 PCP: Scot Jun, FNP    Brief Narrative:    Medical records reviewed and are as summarized below:  [name] [name] is an 73 y.o. male with medical history significant of BPH, paroxysmal atrial fibrillation, CAD, chronic diastolic congestive heart failure, CKD stage III, colon cancer, light chain myeloma, renal cell carcinoma, COPD, type 2 diabetes, hypertension, hyperlipidemia being sent to the ED by his oncologist Dr. Irene Limbo after he was noted to have worsening of his renal function with creatinine up to 5.7.  He also reported symptoms concerning for urinary retention.  Patient states he has had problems emptying his bladder fully and is seen by urology.  He feels this problem is now getting worse.  It takes multiple attempts for him to fully empty his bladder.  States he was seen by urology a week ago as he was noticing blood in his urine.  States he is supposed to see nephrology for his kidney problem but does not have an appointment yet.    Assessment/Plan:   Principal Problem:   AKI (acute kidney injury) ([place]) Active Problems:   BPH (benign prostatic hyperplasia)   Diabetes ([place])   Light chain myeloma ([facility])   CKD (chronic kidney disease), stage III    AKI on CKD 3/ progressively worsening renal function with myeloma - Baseline creatinine 1.5-1.6 in March 2020 and has been progressively worsening since then.   -Renal ultrasound without hydronephrosis. 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  He has a good understanding of the possible toxicities complications and side effects of this treatment plan.  I have written the order for catheter placement and for apheresis  -currently  patient is in agreement for HD catheter to be placed in the AM  Anemia- symptomatic -transfused 1 unit of PRBC  BPH No acute urinary retention noted -Ultrasound showing marked enlargement of the prostate gland causing mass-effect upon the bladder. -Continue home finasteride -PRN bladder scans -I/O if needed and place foley if continues to have issues  Renal mass/concern for renal cell carcinoma -Oncology and urology follow-up  Type 2 diabetes - A1c: 5.2 -not on any medication -hold on SSI for now  Hypertension -Hydralazine PRN SBP >150  obesity Body mass index is 32.32 kg/m.   Family Communication/Anticipated D/C date and plan/Code Status   DVT prophylaxis:heparin Code Status: Full Code.  Family Communication:  Disposition Plan: suspect will be in hospital for several days getting treatment for myeloma   Medical Consultants:    Oncology  Nephrology  IR     Subjective:   No chest pain, no SOB  Objective:    Vitals:   06/27/19 1359 06/27/19 2204 06/27/19 2300 06/28/19 0433  BP: (!) 144/88 (!) 174/90 (!) 154/84 (!) 149/84  Pulse: 61 60  (!) 56  Resp:    18  Temp: 98 F (36.7 C) 98.1 F (36.7 C)  98.2 F (36.8 C)  TempSrc: Oral Oral  Oral  SpO2: 100% 100%  98%  Weight:      Height:        Intake/Output Summary (Last 24 hours) at 06/28/2019 0841 Last data filed at 06/28/2019 0433 Gross per 24 hour  Intake 405.68 ml  Output 2175 ml  Net -1769.32 ml   Filed Weights   06/26/19 0433  Weight: 93.6 kg    Exam: In bed, NAD rrr No increased work of breathing Alert and cooperative  Data Reviewed:   I have personally reviewed following labs and imaging studies:  Labs: Labs show the following:   Basic Metabolic Panel: Recent Labs  Lab 06/25/19 1216 06/25/19 1741 06/26/19 0615 06/27/19 0536 06/28/19 0535  NA 138 137 139 138 138  K 3.6 3.7 3.4* 3.5 3.2*  CL 103 103 103 103 101  CO2 25 23 24 22 23   GLUCOSE 163* 101* 81 122* 85   BUN 62* 61* 62* 64* 71*  CREATININE 5.69* 5.92* 6.11* 5.59* 5.56*  CALCIUM 9.0 9.1 9.3 8.9 9.2  PHOS  --   --   --   --  5.2*   GFR Estimated Creatinine Clearance: 12.5 mL/min (A) (by C-G formula based on SCr of 5.56 mg/dL (H)). Liver Function Tests: Recent Labs  Lab 06/23/19 1320 06/25/19 1216 06/25/19 1741 06/28/19 0535  AST 31 25 28   --   ALT 44 38 41  --   ALKPHOS 173* 178* 161*  --   BILITOT 0.6 0.6 0.5  --   PROT 7.4 7.2 6.8  --   ALBUMIN 3.9 3.8 3.7 3.4*   No results for input(s): LIPASE, AMYLASE in the last 168 hours. No results for input(s): AMMONIA in the last 168 hours. Coagulation profile Recent Labs  Lab 06/28/19 0535  INR 0.9    CBC: Recent Labs  Lab 06/23/19 1320  06/25/19 1216 06/25/19 1741 06/26/19 0615 06/26/19 1653 06/27/19 0738 06/28/19 0535  WBC 5.2  --  5.2 5.5 5.2  --  4.8 4.5  NEUTROABS 3.3  --  3.8  --   --   --   --   --   HGB 7.9*   < > 7.8* 7.8* 7.8* 9.2* 9.4* 8.8*  HCT 23.5*  --  23.6* 24.5* 24.1* 26.9* 27.9* 25.9*  MCV 98.3  --  98.3 102.1* 99.6  --  95.9 93.8  PLT 219  --  202 226 211  --  161 186   < > = values in this interval not displayed.   Cardiac Enzymes: No results for input(s): CKTOTAL, CKMB, CKMBINDEX, TROPONINI in the last 168 hours. BNP (last 3 results) No results for input(s): PROBNP in the last 8760 hours. CBG: Recent Labs  Lab 06/26/19 2247 06/27/19 0445 06/27/19 0755 06/27/19 1121 06/27/19 2214  GLUCAP 73 67* 166* 86 108*   D-Dimer: No results for input(s): DDIMER in the last 72 hours. Hgb A1c: Recent Labs    06/26/19 0230  HGBA1C 5.2   Lipid Profile: No results for input(s): CHOL, HDL, LDLCALC, TRIG, CHOLHDL, LDLDIRECT in the last 72 hours. Thyroid function studies: No results for input(s): TSH, T4TOTAL, T3FREE, THYROIDAB in the last 72 hours.  Invalid input(s): FREET3 Anemia work up: No results for input(s): VITAMINB12, FOLATE, FERRITIN, TIBC, IRON, RETICCTPCT in the last 72 hours. Sepsis  Labs: Recent Labs  Lab 06/25/19 1741 06/26/19 0615 06/27/19 0738 06/28/19 0535  WBC 5.5 5.2 4.8 4.5    Microbiology Recent Results (from the past 240 hour(s))  SARS CORONAVIRUS 2 (TAT 6-24 HRS) Nasopharyngeal Nasopharyngeal Swab     Status: None   Collection Time: 06/26/19  2:48 AM   Specimen: Nasopharyngeal Swab  Result Value Ref Range Status   SARS Coronavirus 2 NEGATIVE NEGATIVE Final    Comment: (NOTE) SARS-CoV-2 target nucleic acids are NOT  DETECTED. The SARS-CoV-2 RNA is generally detectable in upper and lower respiratory specimens during the acute phase of infection. Negative results do not preclude SARS-CoV-2 infection, do not rule out co-infections with other pathogens, and should not be used as the sole basis for treatment or other patient management decisions. Negative results must be combined with clinical observations, patient history, and epidemiological information. The expected result is Negative. Fact Sheet for Patients: SugarRoll.be Fact Sheet for Healthcare Providers: https://www.woods-mathews.com/ This test is not yet approved or cleared by the [place] FDA and  has been authorized for detection and/or diagnosis of SARS-CoV-2 by FDA under an Emergency Use Authorization (EUA). This EUA will remain  in effect (meaning this test can be used) for the duration of the COVID-19 declaration under Section 56 4(b)(1) of the Act, 21 U.S.C. section 360bbb-3(b)(1), unless the authorization is terminated or revoked sooner. Performed at [facility], [place] [address], [place], [place] [address]     Procedures and diagnostic studies:  No results found.  Medications:   . feeding supplement (ENSURE ENLIVE)  237 mL Oral BID BM  . finasteride  5 mg Oral Daily  . [START ON 06/29/2019] heparin injection (subcutaneous)  5,000 Units Subcutaneous Q8H  . potassium chloride  20 mEq Oral Once   Continuous Infusions: .  sodium chloride Stopped (06/27/19 1321)  . therapeutic plasma exchange solution       LOS: 1 day   [facility]   How to contact the [facility] Attending or Consulting provider [place] or covering provider during after hours [place], for this patient?  1. Check the care team in [facility] and look for a) attending/consulting [facility] provider listed and b) the [facility] team listed 2. Log into [email or web address] and use [place]'s universal password to access. If you do not have the password, please contact the hospital operator. 3. Locate the [facility] provider you are looking for under [facility] and page to a number that you can be directly reached. 4. If you still have difficulty reaching the provider, please page the [facility] (Director on Call) for the Hospitalists listed on amion for assistance.  06/28/2019, 8:41 AM

## 2019-06-28 NOTE — Sedation Documentation (Signed)
Patient is resting comfortably. 

## 2019-06-28 NOTE — Consult Note (Addendum)
Chief Complaint: Patient was seen in consultation today for tunneled pheresis catheter placement Chief Complaint  Patient presents with   Abnormal Lab   at the request of Dr Shelda Pal   Supervising Physician: Arne Cleveland  Patient Status: [name] - [facility]-pt  History of Present Illness: [name] [name] is a 73 y.o. 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  He was evaluated by renal yesterday and their feeling was the patient was at high risk for myeloma kidney and might need to undergo hemodialysis at least temporarily in the near future.    I concur with that assessment.  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  He has a good understanding of the possible toxicities complications and side effects of this treatment plan.  I have written the order for catheter placement and for apheresis however I am told there is no availability today.  Hopefully we can get started on this procedure tomorrow.   Past Medical History:  Diagnosis Date   Abnormal CT of liver    a. nodular contour suggesting cirrhosis 05/2018.   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  12/2015 NSTEMI/Cath (in setting of PAF):  LM nl, LAD 30p,  LCX 55m RCA  ok, AM 100%, RPDA1 40, RPDA 2 60 ->Med Rx.   Childhood asthma    Chronic diastolic CHF (congestive heart failure) ([place]    a. 12/2015 Echo: EF 50-55%, mod AI, mod Ao root dil, mild MR, mod dil LA, mod RA.   CKD (chronic kidney disease), stage III    Colon cancer (HCC)    COPD (chronic obstructive pulmonary disease) ([place]    pt denies at preop   Diabetes mellitus type II    diet controlled   History of syncope    Hyperlipidemia    Hypertensive heart disease    Kidney lump 04/04/2010   Overview:  Renal Cell Carcinoma    Light chain myeloma (HCC)    Moderate aortic insufficiency    a. 12/2015 Echo: Mod AI.   Paroxysmal atrial fibrillation ([place]    a. 12/2015 started on Xarelto (CHA2DS2VASc = 4-5).   Pneumonia    Prostate cancer [place]    Renal cell carcinoma ([place]    Sleep apnea    Does not like  CPAP   Spinal stenosis in cervical region 10/12/2016   Venous insufficiency     Past Surgical History:  Procedure Laterality Date   BACK SURGERY     CARDIAC CATHETERIZATION N/A 12/18/2015   Procedure: Left Heart Cath and Coronary Angiography;  Surgeon: DLeonie Man MD;  [facility];  Service: Cardiovascular;  Laterality: N/A;   DIAGNOSTIC LAPAROSCOPY     partial colectomy   LAPAROSCOPIC PARTIAL COLECTOMY Right 05/21/2018   Procedure: LAPAROSCOPIC RIGHT  COLECTOMY ERAS PATHWAY;  Surgeon: Leighton Ruff, MD;  Location: [facility];  Service: General;  Laterality: Right;   [place]   "replaced a disc"    Allergies: Amoxicillin and Penicillins  Medications: Prior to Admission medications   Medication Sig Start Date End Date Taking? Authorizing Provider  amLODipine (NORVASC) 10 MG tablet Take 1 tablet (10 mg total) by mouth daily. 05/17/19  Yes Fulp, Cammie, MD  cetirizine (ZYRTEC) 10 MG tablet Take 10 mg by mouth daily as needed (seasonal allergies).    Yes  [provider]  fluticasone (FLONASE) 50 MCG/ACT nasal spray Place 2 sprays into both nostrils daily as needed (seasonal allergies).  08/18/18  Yes [provider]  potassium chloride SA (KLOR-CON) 20 MEQ tablet Take 20 mEq by mouth daily.   Yes [provider]  tamsulosin (FLOMAX) 0.4 MG CAPS capsule Take 0.4 mg by mouth daily.   Yes [provider]  atorvastatin (LIPITOR) 80 MG tablet Take 1 tablet (80 mg total) by mouth daily. Patient not taking: Reported on 06/27/2019 05/17/19   Fulp, Ander Gaster, MD  finasteride (PROSCAR) 5 MG tablet Take 1 tablet (5 mg total) by mouth daily. Patient not taking: Reported on 06/27/2019 06/10/19   [place] Shi, MD  glucose blood test strip Check blood sugar twice per day. E.11.9 10/06/18   Scot Jun, FNP     Family History  Problem Relation Age of Onset   Emphysema Father    Asthma Father    Liver disease Father        tumor   Heart disease Mother    Hypertension Mother    Asthma Sister    Hypertension Son     Social History   Socioeconomic History   Marital status: Widowed    Spouse name: Not on file   Number of children: Not on file   Years of education: Not on file   Highest education level: Not on file  Occupational History   Not on file  Social Needs   Financial resource strain: Not on file   Food insecurity    Worry: Not on file    Inability: Not on file   Transportation needs    Medical: Not on file    Non-medical: Not on file  Tobacco Use   Smoking status: Former Smoker    Types: Cigars    Quit date: 09/02/1977    Years since quitting: 41.8   Smokeless tobacco: Never Used   Tobacco comment:    Substance and Sexual Activity   Alcohol use: Not Currently    Comment: "stopped drinking alcohol in ~ 1980; just drank a little on the weekends when I did drink"   Drug use: No   Sexual activity: Not Currently  Lifestyle   Physical activity    Days per week: Not on  file    Minutes per session: Not on file   Stress: Not on file  Relationships   Social connections    Talks on phone: Not on file    Gets together: Not on file    Attends religious service: Not on file    Active member of club or organization: Not on file    Attends meetings of clubs or organizations: Not on file    Relationship status: Not on file  Other Topics Concern  Not on file  Social History Narrative   Lives alone.  Wife died in 2023/04/06.  Lives in apartment.      Review of Systems: A 12 point ROS discussed and pertinent positives are indicated in the HPI above.  All other systems are negative.  Review of Systems  Constitutional: Positive for activity change and fatigue. Negative for fever.  Respiratory: Negative for cough and shortness of breath.   Cardiovascular: Negative for chest pain.  Gastrointestinal: Negative for abdominal pain.  Neurological: Positive for weakness.  Psychiatric/Behavioral: Negative for behavioral problems and confusion.    Vital Signs: BP (!) 149/84 (BP Location: Left Arm)    Pulse (!) 56    Temp 98.2 F (36.8 C) (Oral)    Resp 18    Ht 5' 7"  (1.702 m)    Wt 206 lb 5.6 oz (93.6 kg)    SpO2 98%    BMI 32.32 kg/m   Physical Exam Vitals signs reviewed.  Constitutional:      Appearance: Normal appearance.  Cardiovascular:     Rate and Rhythm: Normal rate and regular rhythm.     Heart sounds: Normal heart sounds.  Abdominal:     Palpations: Abdomen is soft.  Musculoskeletal: Normal range of motion.  Skin:    General: Skin is warm and dry.  Neurological:     Mental Status: He is alert and oriented to person, place, and time.  Psychiatric:        Mood and Affect: Mood normal.        Behavior: Behavior normal.        Thought Content: Thought content normal.        Judgment: Judgment normal.     Imaging: Ct Abdomen Pelvis Wo Contrast  Result Date: 06/01/2019 CLINICAL DATA:  Status post partial right colectomy 05/21/2018. Incisional  hernia evaluation. Additional history of prostate cancer. EXAM: CT ABDOMEN AND PELVIS WITHOUT CONTRAST TECHNIQUE: Multidetector CT imaging of the abdomen and pelvis was performed following the standard protocol without IV contrast. Creatinine was obtained on site at [facility] at [address] Results: Creatinine 4.1 mg/dL. COMPARISON:  08/19/2018 PET-CT.  06/05/2018 CT abdomen/pelvis. FINDINGS: Lower chest: No significant pulmonary nodules or acute consolidative airspace disease. Cardiomegaly. Coronary atherosclerosis. Hepatobiliary: Normal liver size. Simple 1.0 cm right liver dome cyst. Several subcentimeter hypodense liver lesions scattered throughout the liver, too small to characterize, not appreciably changed since 06/05/2018 CT, probably benign. No appreciable new liver lesions. Normal gallbladder with no radiopaque cholelithiasis. No biliary ductal dilatation. Pancreas: Normal, with no mass or duct dilation. Spleen: Normal size. No mass. Adrenals/Urinary Tract: Normal adrenals. No renal stones. No hydronephrosis. Dominant 7.2 x 6.2 cm posterior upper left renal cortical mass with density 29 HU (series 2/image 23), increased from 4.6 x 4.8 cm on 06/05/2018 CT. Mixed attenuation 5.1 x 4.2 cm renal cortical mass in the medial lower right kidney (series 2/image 37), previously 5.0 x 4.0 cm on 06/05/2018 CT, not significantly changed. Numerous additional hyperdense renal cortical masses in both kidneys, largest 3.5 cm in the interpolar right kidney (series 2/image 30), increased from 2.8 cm. Several simple renal cysts scattered in both kidneys, largest 4.2 cm in medial interpolar left kidney. Chronic mild diffuse bladder wall thickening is unchanged. No significant bladder distention. No bladder stones. Stomach/Bowel: Normal non-distended stomach. Status post subtotal right hemicolectomy with ileocolic anastomosis in the right abdomen. No small bowel dilatation or wall thickening. Oral contrast  transits to the rectum. Mild scattered diverticulosis  throughout the remnant large-bowel, with no definite large bowel wall thickening or acute pericolonic fat stranding. There is prominent diastasis of the midline supraumbilical ventral abdominal wall (series 2/image 27) containing a portion of the transverse colon. There is a tiny umbilical hernia containing a portion of a small bowel loop. Vascular/Lymphatic: Atherosclerotic abdominal aorta with ectatic 2.8 cm infrarenal abdominal aorta. 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 No pathologically enlarged lymph nodes in the abdomen or pelvis. Reproductive: Markedly enlarged prostate with mass-effect on the bladder base with nonspecific internal prostatic calcifications, not appreciably changed. Other: No pneumoperitoneum, ascites or focal fluid collection. Musculoskeletal: No aggressive appearing focal osseous lesions. Marked thoracolumbar spondylosis. IMPRESSION: 1. Significant interval growth of 7.2 cm posterior upper left renal cortical mass, which was hypermetabolic on 16/06/9603 PET-CT, and which was enhancing on 03/24/2018 CT abdomen/pelvis. 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 Urology consultation advised. 2. Numerous additional complex hyperdense bilateral renal masses are incompletely evaluated on this noncontrast study. Additional renal cell carcinomas cannot be excluded. MRI abdomen without and with IV contrast is indicated for further evaluation. 3. Prominent diastasis of the midline supraumbilical ventral abdominal wall, containing a portion of the transverse colon. 4. Separate tiny umbilical hernia contains a portion of a small bowel loop. No acute bowel complication. 5. Cardiomegaly.  Coronary atherosclerosis. 6. Chronic mild diffuse bladder wall thickening, probably due to chronic bladder outlet obstruction by the markedly enlarged prostate. No hydronephrosis. 7. Ectatic 2.8  cm infrarenal abdominal aorta, at risk for aneurysm development. Recommend follow-up aortic ultrasound in 5 years. This recommendation follows ACR consensus guidelines: White Paper of the ACR Incidental Findings Committee II on Vascular Findings. J Am Coll Radiol 2013; 10:789-794. 8.  Aortic Atherosclerosis (ICD10-I70.0). These results will be called to the ordering clinician or representative by the Radiologist Assistant, and communication documented in the PACS or zVision Dashboard. Patient's acute renal failure as detected by creatinine testing prior to the scan was called by the CT technologist to triage RN Abigail Butts in the provider's office. Electronically Signed   By: Ilona Sorrel M.D.   On: 06/01/2019 14:00   US Renal  Result Date: 06/26/2019 CLINICAL DATA:  Acute kidney injury. EXAM: RENAL / URINARY TRACT ULTRASOUND COMPLETE COMPARISON:  06/04/2019 FINDINGS: Right Kidney: Renal measurements: 10.9 x 4.9 x 6.1 cm. = volume: 170 mL. Increased cortical echogenicity. No hydronephrosis. Multiple right kidney cysts. The dominant cyst is complex with solid and cystic components arising from the lower pole measuring 3.8 x 4.0 x 4.3 cm. Left Kidney: Renal measurements: 12.0 x 6.3 x 6.4 cm. = volume: 249.3 mL. Increased echogenicity. No hydronephrosis. Multiple lesions within the left kidney are again identified. The dominant lesion appears solid and arises from the upper pole of the left kidney measuring 7.6 x 5.5 x 6.5 cm. Other lesions have imaging features of simple cysts. Bladder: There is marked enlargement of the prostate gland which has mass effect upon the bladder. Other: None IMPRESSION: Bilateral increased renal cortical echogenicity compatible with chronic medical renal disease. No hydronephrosis. Multiple bilateral simple appearing and complex kidney lesions. Solid and cystic lesion within the inferior pole of the right kidney is noted measuring 4.3 cm. Cystic renal cell carcinoma cannot be excluded. A  solid-appearing lesion arises from the upper pole of the left kidney measuring 7.6 cm. As mentioned on previous imaging findings are concerning for renal cell carcinoma. Prostate gland enlargement. Electronically Signed   By: Queen Slough.D.  On: 06/26/2019 04:38   US Renal  Result Date: 06/04/2019 CLINICAL DATA:  Acute kidney injury. EXAM: RENAL / URINARY TRACT ULTRASOUND COMPLETE COMPARISON:  Abdominopelvic CT 06/01/2019. Renal ultrasound 06/06/2018. PET-CT 08/19/2018. FINDINGS: Right Kidney: Renal measurements: 13.0 x 5.6 x 4.9 cm = volume: 185.1 mL. There is a simple appearing cystic lesion in the upper interpolar region measuring 3.0 x 2.6 x 2.7 cm. In the lower pole, there is a mildly septated cystic lesion measuring 4.2 x 3.3 x 3.7 cm. Numerous other smaller cystic lesions are present. No hydronephrosis. Left Kidney: Renal measurements: 10.5 x 6.2 x 4.4 cm = volume: 151 mL. The known solid mass in the upper pole which was hypermetabolic on previous PET-CT shows continued enlargement as seen on earlier CT. This now measures 7.3 x 6.0 x 8.2 cm (4.9 x 4.5 x 4.5 cm on previous ultrasound of 1 year ago). Color Doppler analysis of this lesion is suboptimal. There are additional cystic lesions in the interpolar region, measuring up to 4.2 and 3.5 cm respectively. No hydronephrosis. Bladder: Median lobe prostate hypertrophy protrudes into the bladder lumen as seen on previous studies. There is a separate filling defect which was incompletely evaluated by this examination, probably reflecting blood clot. Patient was unable to complete the examination, needing to void. IMPRESSION: 1. Known solid mass involving the upper pole of the left kidney shows continued enlargement as seen on earlier CT scan. This remains consistent with neoplasm, likely renal cell carcinoma. Please refer to recommendations on CT scan performed 06/01/2019. 2. Additional simple and complex cystic lesions bilaterally. No hydronephrosis. 3.  Filling defect in the urinary bladder lumen adjacent to an enlarged median lobe, probably blood clot. This was incompletely evaluated by this examination. Electronically Signed   By: Richardean Sale M.D.   On: 06/04/2019 11:38   Dg Foot 2 Views Left  Result Date: 06/03/2019 CLINICAL DATA:  One week of great toe swelling and pain EXAM: LEFT FOOT - 2 VIEW COMPARISON:  None. FINDINGS: Soft tissue swelling medial to the first MTP joint with soft tissue calcification. Mild hallux valgus. No definite juxta articular or marginal erosion. IMPRESSION: Swelling and soft tissue calcification at the first MTP joint, suspect gout. Electronically Signed   By: Monte Fantasia M.D.   On: 06/03/2019 05:40    Labs:  CBC: Recent Labs    06/25/19 1741 06/26/19 0615 06/26/19 1653 06/27/19 0738 06/28/19 0535  WBC 5.5 5.2  --  4.8 4.5  HGB 7.8* 7.8* 9.2* 9.4* 8.8*  HCT 24.5* 24.1* 26.9* 27.9* 25.9*  PLT 226 211  --  161 186    COAGS: Recent Labs    06/04/19 0445 06/28/19 0535  INR 0.8 0.9    BMP: Recent Labs    06/25/19 1741 06/26/19 0615 06/27/19 0536 06/28/19 0535  NA 137 139 138 138  K 3.7 3.4* 3.5 3.2*  CL 103 103 103 101  CO2 23 24 22 23   GLUCOSE 101* 81 122* 85  BUN 61* 62* 64* 71*  CALCIUM 9.1 9.3 8.9 9.2  CREATININE 5.92* 6.11* 5.59* 5.56*  GFRNONAA 9* 8* 9* 9*  GFRAA 10* 10* 11* 11*    LIVER FUNCTION TESTS: Recent Labs    06/03/19 0419  06/23/19 1320 06/25/19 1216 06/25/19 1741 06/28/19 0535  BILITOT 0.7  --  0.6 0.6 0.5  --   AST 39  --  31 25 28   --   ALT 48*  --  44 38 41  --   ALKPHOS  172*  --  173* 178* 161*  --   PROT 7.7  --  7.4 7.2 6.8  --   ALBUMIN 4.3   < > 3.9 3.8 3.7 3.4*   < > = values in this interval not displayed.    TUMOR MARKERS: No results for input(s): AFPTM, CEA, CA199, CHROMGRNA in the last 8760 hours.  Assessment and Plan:  Myeloma kidney Worsening renal function Scheduled for Apheresis tunneled catheter placement (possible use for  dialysis at later) Risks and benefits discussed with the patient including, but not limited to bleeding, infection, vascular injury, pneumothorax which may require chest tube placement, air embolism or even death  All of the patient's questions were answered, patient is agreeable to proceed. Consent signed and in chart.   Thank you for this interesting consult.  I greatly enjoyed meeting [name] [name] and look forward to participating in their care.  A copy of this report was sent to the requesting provider on this date.  Electronically Signed: Lavonia Drafts, PA-C 06/28/2019, 8:37 AM   I spent a total of 20 Minutes    in face to face in clinical consultation, greater than 50% of which was counseling/coordinating care for tunneled apheresis catheter placement

## 2019-06-28 NOTE — Plan of Care (Signed)
  Problem: Clinical Measurements: Goal: Respiratory complications will improve Outcome: Progressing   Problem: Clinical Measurements: Goal: Diagnostic test results will improve Outcome: Progressing   Problem: Nutrition: Goal: Adequate nutrition will be maintained Outcome: Progressing   Problem: Coping: Goal: Level of anxiety will decrease Outcome: Progressing   Problem: Elimination: Goal: Will not experience complications related to bowel motility Outcome: Progressing Goal: Will not experience complications related to urinary retention Outcome: Progressing   Problem: Pain Managment: Goal: General experience of comfort will improve Outcome: Progressing   Problem: Skin Integrity: Goal: Risk for impaired skin integrity will decrease Outcome: Progressing

## 2019-06-29 ENCOUNTER — Other Ambulatory Visit (HOSPITAL_COMMUNITY): Payer: Medicare Other

## 2019-06-29 ENCOUNTER — Inpatient Hospital Stay: Payer: Medicare Other

## 2019-06-29 DIAGNOSIS — D649 Anemia, unspecified: Secondary | ICD-10-CM | POA: Diagnosis not present

## 2019-06-29 DIAGNOSIS — C9000 Multiple myeloma not having achieved remission: Secondary | ICD-10-CM | POA: Diagnosis not present

## 2019-06-29 DIAGNOSIS — N08 Glomerular disorders in diseases classified elsewhere: Secondary | ICD-10-CM

## 2019-06-29 LAB — RENAL FUNCTION PANEL
Albumin: 3.2 g/dL — ABNORMAL LOW (ref 3.5–5.0)
Anion gap: 13 (ref 5–15)
BUN: 71 mg/dL — ABNORMAL HIGH (ref 8–23)
CO2: 23 mmol/L (ref 22–32)
Calcium: 9.3 mg/dL (ref 8.9–10.3)
Chloride: 103 mmol/L (ref 98–111)
Creatinine, Ser: 5.57 mg/dL — ABNORMAL HIGH (ref 0.61–1.24)
GFR calc Af Amer: 11 mL/min — ABNORMAL LOW (ref >60)
GFR calc non Af Amer: 9 mL/min — ABNORMAL LOW (ref >60)
Glucose, Bld: 88 mg/dL (ref 70–99)
Phosphorus: 5.2 mg/dL — ABNORMAL HIGH (ref 2.5–4.6)
Potassium: 3.7 mmol/L (ref 3.5–5.1)
Sodium: 139 mmol/L (ref 135–145)

## 2019-06-29 LAB — POCT I-STAT, CHEM 8
BUN: 77 mg/dL — ABNORMAL HIGH (ref 8–23)
Calcium, Ion: 1.22 mmol/L (ref 1.15–1.40)
Chloride: 101 mmol/L (ref 98–111)
Creatinine, Ser: 5.4 mg/dL — ABNORMAL HIGH (ref 0.61–1.24)
Glucose, Bld: 87 mg/dL (ref 70–99)
HCT: 28 % — ABNORMAL LOW (ref 39.0–52.0)
Hemoglobin: 9.5 g/dL — ABNORMAL LOW (ref 13.0–17.0)
Potassium: 3.7 mmol/L (ref 3.5–5.1)
Sodium: 137 mmol/L (ref 135–145)
TCO2: 24 mmol/L (ref 22–32)

## 2019-06-29 LAB — BPAM RBC
Blood Product Expiration Date: 202011202359
ISSUE DATE / TIME: 202010231450
Unit Type and Rh: 6200

## 2019-06-29 LAB — CBC WITH DIFFERENTIAL/PLATELET
Abs Immature Granulocytes: 0.02 10*3/uL (ref 0.00–0.07)
Basophils Absolute: 0 10*3/uL (ref 0.0–0.1)
Basophils Relative: 1 %
Eosinophils Absolute: 0.1 10*3/uL (ref 0.0–0.5)
Eosinophils Relative: 2 %
HCT: 27.7 % — ABNORMAL LOW (ref 39.0–52.0)
Hemoglobin: 9.3 g/dL — ABNORMAL LOW (ref 13.0–17.0)
Immature Granulocytes: 0 %
Lymphocytes Relative: 20 %
Lymphs Abs: 1.1 10*3/uL (ref 0.7–4.0)
MCH: 32.5 pg (ref 26.0–34.0)
MCHC: 33.6 g/dL (ref 30.0–36.0)
MCV: 96.9 fL (ref 80.0–100.0)
Monocytes Absolute: 0.5 10*3/uL (ref 0.1–1.0)
Monocytes Relative: 10 %
Neutro Abs: 3.5 10*3/uL (ref 1.7–7.7)
Neutrophils Relative %: 67 %
Platelets: 199 10*3/uL (ref 150–400)
RBC: 2.86 MIL/uL — ABNORMAL LOW (ref 4.22–5.81)
RDW: 12.8 % (ref 11.5–15.5)
WBC: 5.2 10*3/uL (ref 4.0–10.5)
nRBC: 0 % (ref 0.0–0.2)

## 2019-06-29 LAB — TYPE AND SCREEN
ABO/RH(D): A POS
Antibody Screen: NEGATIVE
Unit division: 0

## 2019-06-29 LAB — CBC
HCT: 27.7 % — ABNORMAL LOW (ref 39.0–52.0)
Hemoglobin: 9.1 g/dL — ABNORMAL LOW (ref 13.0–17.0)
MCH: 31.9 pg (ref 26.0–34.0)
MCHC: 32.9 g/dL (ref 30.0–36.0)
MCV: 97.2 fL (ref 80.0–100.0)
Platelets: 189 10*3/uL (ref 150–400)
RBC: 2.85 MIL/uL — ABNORMAL LOW (ref 4.22–5.81)
RDW: 13 % (ref 11.5–15.5)
WBC: 5.2 10*3/uL (ref 4.0–10.5)
nRBC: 0 % (ref 0.0–0.2)

## 2019-06-29 MED ORDER — ACETAMINOPHEN 325 MG PO TABS: 650.0000 mg | ORAL_TABLET | ORAL | Status: DC | PRN

## 2019-06-29 MED ORDER — ACETAMINOPHEN 325 MG PO TABS: 650.0000 mg | ORAL_TABLET | Freq: Once | ORAL | Status: DC

## 2019-06-29 MED ORDER — CALCIUM CARBONATE ANTACID 500 MG PO CHEW
CHEWABLE_TABLET | ORAL | Status: AC
  Administered 2019-06-29: 16:00:00 400 mg via ORAL
  Filled 2019-06-29: qty 4

## 2019-06-29 MED ORDER — BORTEZOMIB CHEMO SQ INJECTION 3.5 MG (2.5MG/ML)
1.5000 mg/m2 | Freq: Once | INTRAMUSCULAR | Status: AC
  Administered 2019-06-29: 16:00:00 3.25 mg via SUBCUTANEOUS
  Filled 2019-06-29: qty 1.3

## 2019-06-29 MED ORDER — PROCHLORPERAZINE MALEATE 10 MG PO TABS
10.0000 mg | ORAL_TABLET | Freq: Once | ORAL | Status: AC
  Administered 2019-06-29: 16:00:00 10 mg via ORAL
  Filled 2019-06-29: qty 1

## 2019-06-29 MED ORDER — SODIUM CHLORIDE 0.9 % IV SOLN
2.0000 g | Freq: Once | INTRAVENOUS | Status: AC
  Administered 2019-06-29: 2 g via INTRAVENOUS
  Filled 2019-06-29: qty 20

## 2019-06-29 MED ORDER — DIPHENHYDRAMINE HCL 25 MG PO CAPS: 25.0000 mg | ORAL_CAPSULE | Freq: Four times a day (QID) | ORAL | Status: DC | PRN

## 2019-06-29 MED ORDER — CALCIUM CARBONATE ANTACID 500 MG PO CHEW
2.0000 | CHEWABLE_TABLET | ORAL | Status: AC
  Administered 2019-06-29: 16:00:00 400 mg via ORAL

## 2019-06-29 MED ORDER — ACD FORMULA A 0.73-2.45-2.2 GM/100ML VI SOLN
Status: AC
  Administered 2019-06-29: 16:00:00 500 mL via INTRAVENOUS
  Filled 2019-06-29: qty 500

## 2019-06-29 MED ORDER — HEPARIN SODIUM (PORCINE) 1000 UNIT/ML IJ SOLN
INTRAMUSCULAR | Status: AC
  Administered 2019-06-29: 3800 [IU]
  Filled 2019-06-29: qty 4

## 2019-06-29 MED ORDER — SODIUM CHLORIDE 0.9 % IV SOLN
INTRAVENOUS | Status: AC
  Administered 2019-06-29 (×4): via INTRAVENOUS_CENTRAL
  Filled 2019-06-29 (×4): qty 200

## 2019-06-29 MED ORDER — ACD FORMULA A 0.73-2.45-2.2 GM/100ML VI SOLN
500.0000 mL | Status: DC
  Administered 2019-06-29: 16:00:00 500 mL via INTRAVENOUS
  Filled 2019-06-29 (×2): qty 500

## 2019-06-29 MED ORDER — BORTEZOMIB CHEMO SQ INJECTION 3.5 MG (2.5MG/ML)
1.5000 mg/m2 | Freq: Once | INTRAMUSCULAR | Status: DC
  Filled 2019-06-29: qty 1.3

## 2019-06-29 MED ORDER — PROCHLORPERAZINE MALEATE 10 MG PO TABS
10.0000 mg | ORAL_TABLET | Freq: Once | ORAL | Status: DC
  Filled 2019-06-29: qty 1

## 2019-06-29 MED ORDER — ACETAMINOPHEN 325 MG PO TABS
650.0000 mg | ORAL_TABLET | Freq: Once | ORAL | Status: AC
  Administered 2019-06-29: 16:00:00 650 mg via ORAL
  Filled 2019-06-29: qty 2

## 2019-06-29 MED ORDER — PROCHLORPERAZINE MALEATE 10 MG PO TABS: 10.0000 mg | ORAL_TABLET | Freq: Once | ORAL | Status: DC

## 2019-06-29 MED ORDER — HEPARIN SODIUM (PORCINE) 1000 UNIT/ML IJ SOLN
1000.0000 [IU] | Freq: Once | INTRAMUSCULAR | Status: AC
  Administered 2019-06-29: 18:00:00 3800 [IU]
  Filled 2019-06-29: qty 1

## 2019-06-29 NOTE — TOC Initial Note (Signed)
Transition of Care [facility]) - Initial/Assessment Note    Patient Details  Name: [name] [name] MRN: [id number] Date of Birth: 05/07/1946  Transition of Care [facility]) CM/SW Contact:    Marilu Favre, RN Phone Number: 06/29/2019, 2:26 PM  Clinical Narrative:                 Patient from home by self. Has 6 children and a large church family.   Patient's friend "runs" [employer] and his PCP has done paperwork for him to receive services.   Patient has transportation to appointments, active with PCP Dr Kenton Kingfisher and can afford prescriptions.  Expected Discharge Plan: Home/Self Care Barriers to Discharge: Continued Medical Work up   Patient Goals and CMS Choice Patient states their goals for this hospitalization and ongoing recovery are:: to go home CMS Medicare.gov Compare Post Acute Care list provided to:: Patient Choice offered to / list presented to : NA  Expected Discharge Plan and Services Expected Discharge Plan: Home/Self Care       Living arrangements for the past 2 months: Apartment                 DME Arranged: N/A         HH Arranged: NA          Prior Living Arrangements/Services Living arrangements for the past 2 months: Apartment Lives with:: Self Patient language and need for interpreter reviewed:: Yes Do you feel safe going back to the place where you live?: Yes      Need for Family Participation in Patient Care: No (Comment) Care giver support system in place?: Yes (comment)   Criminal Activity/Legal Involvement Pertinent to Current Situation/Hospitalization: No - Comment as needed  Activities of Daily Living Home Assistive Devices/Equipment: CBG Meter, Other (Comment), Eyeglasses(self-cath) ADL Screening (condition at time of admission) Patient's cognitive ability adequate to safely complete daily activities?: Yes Is the patient deaf or have difficulty hearing?: Yes Does the patient have difficulty seeing, even when wearing glasses/contacts?:  No Does the patient have difficulty concentrating, remembering, or making decisions?: No Patient able to express need for assistance with ADLs?: Yes Does the patient have difficulty dressing or bathing?: No Independently performs ADLs?: Yes (appropriate for developmental age) Does the patient have difficulty walking or climbing stairs?: No Weakness of Legs: Right Weakness of Arms/Hands: None  Permission Sought/Granted   Permission granted to share information with : No              Emotional Assessment Appearance:: Appears stated age Attitude/Demeanor/Rapport: Engaged Affect (typically observed): Accepting Orientation: : Oriented to Self, Oriented to Place, Oriented to  Time, Oriented to Situation Alcohol / Substance Use: Not Applicable Psych Involvement: No (comment)  Admission diagnosis:  Elevated serum creatinine [R79.89] AKI (acute kidney injury) ([place]) [N17.9] Patient Active Problem List   Diagnosis Date Noted  . 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 Acute renal failure superimposed on stage 3 chronic kidney disease ([place]) 06/04/2019  . CKD (chronic kidney disease), stage III 06/04/2019  . Renal cell carcinoma ([place]) 06/04/2019  . Gross hematuria 06/04/2019  . COPD exacerbation ([place]) 11/05/2018  . Palpitations 10/22/2018  . Counseling regarding advance care planning and goals of care 06/22/2018  . AKI (acute kidney injury) ([place])   . Anemia   . Sepsis ([place]) 06/05/2018  . Acute lower UTI 06/05/2018  . Colon polyp 05/21/2018  . Aortic valve regurgitation 04/14/2018  . Medication management 04/14/2018  . Thoracic aortic aneurysm without rupture ([place]) 09/29/2017  .  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) 10/12/2016  . Head trauma, subsequent encounter 10/12/2016  . 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 Spinal stenosis in cervical region 10/12/2016  . Cervical spondylosis 10/12/2016  . Syncope and  collapse 10/12/2016  . Fall   . Syncope 10/11/2016  . CAD (coronary artery disease)   . Chronic diastolic CHF (congestive heart failure) ([place])   . Hyperlipidemia   . Hypertensive heart disease   . Paroxysmal atrial fibrillation ([place]) 12/17/2015  . 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 Mild intermittent asthma 10/18/2015  . Aortic root dilatation ([place]) 10/17/2015  . Diabetes ([place]) 10/17/2015  . 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 Hand paresthesia 12/09/2014  . Elevated prostate specific antigen (PSA) 07/12/2014  . Allergic rhinitis 07/12/2014  . LBP (low back pain) 07/12/2014  . Palpitation 07/11/2014  . Patient's other noncompliance with medication regimen 08/10/2013  . Chronic venous insufficiency 07/23/2012  . Obstructive apnea 04/11/2011  . Essential (primary) hypertension 09/07/2010  . 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 Anxiety, generalized 04/04/2010  . Cardiac conduction disorder 02/27/2010  . BPH (benign prostatic hyperplasia) 09/13/2009  . Abnormal findings on examination of genitourinary organs 08/09/2009  . Hereditary and idiopathic neuropathy 07/05/2009   PCP:  Scot Jun, FNP Pharmacy:   [id number] - [place], [place] [address] [place] [address] Phone: [phone] Fax: [phone]     Social Determinants of Health (SDOH) Interventions    Readmission Risk Interventions No flowsheet data found.

## 2019-06-29 NOTE — Progress Notes (Signed)
Progress Note    COLUM COLT  XKG:[id number] DOB: 01/09/1946  DOA: 06/25/2019 PCP: Scot Jun, FNP    Brief Narrative:    Medical records reviewed and are as summarized below:  [name] [name] is an 73 y.o. male with medical history significant of BPH, paroxysmal atrial fibrillation, CAD, chronic diastolic congestive heart failure, CKD stage III, colon cancer, light chain myeloma, renal cell carcinoma, COPD, type 2 diabetes, hypertension, hyperlipidemia being sent to the ED by his oncologist Dr. Irene Limbo after he was noted to have worsening of his renal function with creatinine up to 5.7.  He also reported symptoms concerning for urinary retention.  Patient states he has had problems emptying his bladder fully and is seen by urology.  He feels this problem is now getting worse.  It takes multiple attempts for him to fully empty his bladder.  States he was seen by urology a week ago as he was noticing blood in his urine.  States he is supposed to see nephrology for his kidney problem but does not have an appointment yet.  Now with catheter if HD needed- plan per oncology is to start his treatment and monitor Cr.   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   -Renal ultrasound without hydronephrosis. -nephrology and oncology consult as this is a difficult situation -per oncology:  -transfuse prn for hgb<8 -will start with [place] Velcade weekly from tomorrow and add Cytoxan next week if stable. -was start on plasmapheresis per Dr Curlene Labrum --difficult endpoint given low likelihood of rapid renal improvement and rapid decrease in light  chain burden with treatment. -high likelihood of needing HD and increased risk of TLS with myeloma treatment. -continue to discuss goals of care with patient since he is quite unreliable and expect questionable followup as previously.  -currently patient is in agreement for HD if needed- -catheter placed  Anemia- symptomatic -transfused 1 unit of PRBC  BPH No acute urinary retention noted -Ultrasound showing marked enlargement of the prostate gland causing mass-effect upon the bladder. -Continue home finasteride -PRN bladder scans -I/O if needed and place foley if continues to have issues  Renal mass/concern for renal cell carcinoma -Oncology and urology follow-up  Type 2 diabetes - A1c: 5.2 -not on any medication -hold on SSI for now  Hypertension -Hydralazine PRN SBP >150  obesity Body mass index is 32.32 kg/m.   Family Communication/Anticipated D/C date and plan/Code Status   DVT prophylaxis:heparin Code Status: Full Code.  Family Communication:  Disposition Plan: suspect will be in hospital for several days getting treatment for myeloma   Medical Consultants:    Oncology  Nephrology  IR     Subjective:   No chest pain, no SOB  Objective:    Vitals:   06/28/19 1625 06/28/19 1657 06/28/19 2137 06/29/19 0403  BP: (!) 142/89 (!) 161/84 (!) 142/83 (!) 142/87  Pulse: (!) 58 (!) 54 (!) 56 (!) 58  Resp: 19 18 18 18   Temp:  97.6 F (36.4 C) 97.6 F (36.4 C) 98.1 F (36.7 C)  TempSrc:  Oral Oral Oral  SpO2: 96% 97% 100% 99%  Weight:      Height:        Intake/Output Summary (Last 24 hours) at 06/29/2019 1042 Last data filed at 06/29/2019 0700 Gross per 24 hour  Intake 360 ml  Output 1700 ml  Net -1340 ml   Filed Weights   06/26/19 0433  Weight: 93.6 kg    Exam: In bed, NAD rrr No increased work of breathing Pleasant and cooperative  Data Reviewed:   I have personally reviewed following labs and imaging studies:  Labs: Labs  show the following:   Basic Metabolic Panel: Recent Labs  Lab 06/25/19 1741 06/26/19 0615 06/27/19 0536 06/28/19 0535 06/29/19 0517  NA 137 139 138 138 139  K 3.7 3.4* 3.5 3.2* 3.7  CL 103 103 103 101 103  CO2 23 24 22 23 23   GLUCOSE 101* 81 122* 85 88  BUN 61* 62* 64* 71* 71*  CREATININE 5.92* 6.11* 5.59* 5.56* 5.57*  CALCIUM 9.1 9.3 8.9 9.2 9.3  PHOS  --   --   --  5.2* 5.2*   GFR Estimated Creatinine Clearance: 12.5 mL/min (A) (by C-G formula based on SCr of 5.57 mg/dL (H)). Liver Function Tests: Recent Labs  Lab 06/23/19 1320 06/25/19 1216 06/25/19 1741 06/28/19 0535 06/29/19 0517  AST 31 25 28   --   --   ALT 44 38 41  --   --   ALKPHOS 173* 178* 161*  --   --   BILITOT 0.6 0.6 0.5  --   --   PROT 7.4 7.2 6.8  --   --   ALBUMIN 3.9 3.8 3.7 3.4* 3.2*   No results for input(s): LIPASE, AMYLASE in the last 168 hours. No results for input(s): AMMONIA in the last 168 hours. Coagulation profile Recent Labs  Lab 06/28/19 0535  INR 0.9    CBC: Recent Labs  Lab 06/23/19 1320  06/25/19 1216 06/25/19 1741 06/26/19 0615 06/26/19 1653 06/27/19 0738 06/28/19 0535 06/29/19 0517  WBC 5.2   < > 5.2 5.5 5.2  --  4.8 4.5 5.2  NEUTROABS 3.3  --  3.8  --   --   --   --   --   --   HGB 7.9*   < > 7.8* 7.8* 7.8* 9.2* 9.4* 8.8* 9.1*  HCT 23.5*  --  23.6* 24.5* 24.1* 26.9* 27.9* 25.9* 27.7*  MCV 98.3  --  98.3 102.1* 99.6  --  95.9 93.8 97.2  PLT 219   < > 202 226 211  --  161 186 189   < > = values in this interval not displayed.   Cardiac Enzymes: No results for input(s): CKTOTAL, CKMB, CKMBINDEX, TROPONINI in the last 168 hours. BNP (last 3 results) No results for input(s): PROBNP in the last 8760 hours. CBG: Recent Labs  Lab 06/26/19 2247 06/27/19 0445 06/27/19 0755 06/27/19 1121 06/27/19 2214  GLUCAP 73 67* 166* 86 108*   D-Dimer: No results for input(s): DDIMER in the last 72 hours. Hgb A1c: No results for input(s): HGBA1C in the last 72  hours. Lipid Profile: No results for input(s): CHOL, HDL, LDLCALC, TRIG, CHOLHDL, LDLDIRECT in the last 72 hours. Thyroid function studies: No results for input(s): TSH, T4TOTAL, T3FREE, THYROIDAB in the last 72 hours.  Invalid input(s): FREET3 Anemia work up: No results for input(s): VITAMINB12, FOLATE, FERRITIN, TIBC, IRON, RETICCTPCT in the last 72 hours. Sepsis Labs: Recent Labs  Lab 06/26/19 0615 06/27/19 0738 06/28/19 0535 06/29/19 0517  WBC 5.2 4.8  4.5 5.2    Microbiology Recent Results (from the past 240 hour(s))  SARS CORONAVIRUS 2 (TAT 6-24 HRS) Nasopharyngeal Nasopharyngeal Swab     Status: None   Collection Time: 06/26/19  2:48 AM   Specimen: Nasopharyngeal Swab  Result Value Ref Range Status   SARS Coronavirus 2 NEGATIVE NEGATIVE Final    Comment: (NOTE) SARS-CoV-2 target nucleic acids are NOT DETECTED. The SARS-CoV-2 RNA is generally detectable in upper and lower respiratory specimens during the acute phase of infection. Negative results do not preclude SARS-CoV-2 infection, do not rule out co-infections with other pathogens, and should not be used as the sole basis for treatment or other patient management decisions. Negative results must be combined with clinical observations, patient history, and epidemiological information. The expected result is Negative. Fact Sheet for Patients: SugarRoll.be Fact Sheet for Healthcare Providers: https://www.woods-mathews.com/ This test is not yet approved or cleared by the [place] FDA and  has been authorized for detection and/or diagnosis of SARS-CoV-2 by FDA under an Emergency Use Authorization (EUA). This EUA will remain  in effect (meaning this test can be used) for the duration of the COVID-19 declaration under Section 56 4(b)(1) of the Act, 21 U.S.C. section 360bbb-3(b)(1), unless the authorization is terminated or revoked sooner. Performed at [facility], [place] [address], [place], [place] [address]     Procedures and diagnostic studies:  Ir Fluoro Guide Cv Line Right  Result Date: 06/28/2019 CLINICAL DATA:  Renal failure, needs tunneled pheresis catheter, with possible future use for hemodialysis. EXAM: TUNNELED PHERESIS/HEMODIALYSIS CATHETER PLACEMENT WITH ULTRASOUND AND FLUOROSCOPIC GUIDANCE TECHNIQUE: The procedure, risks, benefits, and alternatives were explained to the patient. Questions regarding the procedure were encouraged and answered. The patient understands and consents to the procedure. As antibiotic prophylaxis, vancomycin 1 g was ordered pre-procedure and administered intravenously within one hour of incision.Patency of the right IJ vein was confirmed with ultrasound with image documentation. An appropriate skin site was determined. Region was prepped using maximum barrier technique including cap and mask, sterile gown, sterile gloves, large sterile sheet, and Chlorhexidine as cutaneous antisepsis. The region was infiltrated locally with 1% lidocaine. Intravenous Fentanyl 18mcg and Versed 1mg  were administered as conscious sedation during continuous monitoring of the patient's level of consciousness and physiological / cardiorespiratory status by the radiology RN, with a total moderate sedation time of 10 minutes. Under real-time ultrasound guidance, the right IJ vein was accessed with a 21 gauge micropuncture needle; the needle tip within the vein was confirmed with ultrasound image documentation. Needle exchanged over the 018 guidewire for transitional dilator, which allowed advancement of a Benson wire into the IVC. Over this, an MPA catheter was advanced. A Palindrome 23 hemodialysis catheter was tunneled from the right anterior chest wall approach to the right IJ dermatotomy site. The MPA catheter was exchanged over an Amplatz wire for serial vascular dilators which allow placement of a peel-away sheath, through which the catheter was  advanced under intermittent fluoroscopy, positioned with its tips in the proximal and midright atrium. Spot chest radiograph confirms good catheter position. No pneumothorax. Catheter was flushed and primed per protocol. Catheter secured externally with O Prolene sutures. The right IJ dermatotomy site was closed with Dermabond. COMPLICATIONS: COMPLICATIONS None immediate FLUOROSCOPY TIME:  0.9 minute; 118  uGym2 DAP COMPARISON:  None IMPRESSION: 1. Technically successful placement of tunneled right IJ hemodialysis/pheresis catheter with ultrasound and fluoroscopic guidance. Ready for routine use. ACCESS: Remains approachable for percutaneous intervention as needed. Electronically Signed  By: Lucrezia Europe M.D.   On: 06/28/2019 17:06   Ir US Guide Vasc Access Right  Result Date: 06/28/2019 CLINICAL DATA:  Renal failure, needs tunneled pheresis catheter, with possible future use for hemodialysis. EXAM: TUNNELED PHERESIS/HEMODIALYSIS CATHETER PLACEMENT WITH ULTRASOUND AND FLUOROSCOPIC GUIDANCE TECHNIQUE: The procedure, risks, benefits, and alternatives were explained to the patient. Questions regarding the procedure were encouraged and answered. The patient understands and consents to the procedure. As antibiotic prophylaxis, vancomycin 1 g was ordered pre-procedure and administered intravenously within one hour of incision.Patency of the right IJ vein was confirmed with ultrasound with image documentation. An appropriate skin site was determined. Region was prepped using maximum barrier technique including cap and mask, sterile gown, sterile gloves, large sterile sheet, and Chlorhexidine as cutaneous antisepsis. The region was infiltrated locally with 1% lidocaine. Intravenous Fentanyl 72mcg and Versed 1mg  were administered as conscious sedation during continuous monitoring of the patient's level of consciousness and physiological / cardiorespiratory status by the radiology RN, with a total moderate sedation time  of 10 minutes. Under real-time ultrasound guidance, the right IJ vein was accessed with a 21 gauge micropuncture needle; the needle tip within the vein was confirmed with ultrasound image documentation. Needle exchanged over the 018 guidewire for transitional dilator, which allowed advancement of a Benson wire into the IVC. Over this, an MPA catheter was advanced. A Palindrome 23 hemodialysis catheter was tunneled from the right anterior chest wall approach to the right IJ dermatotomy site. The MPA catheter was exchanged over an Amplatz wire for serial vascular dilators which allow placement of a peel-away sheath, through which the catheter was advanced under intermittent fluoroscopy, positioned with its tips in the proximal and midright atrium. Spot chest radiograph confirms good catheter position. No pneumothorax. Catheter was flushed and primed per protocol. Catheter secured externally with O Prolene sutures. The right IJ dermatotomy site was closed with Dermabond. COMPLICATIONS: COMPLICATIONS None immediate FLUOROSCOPY TIME:  0.9 minute; 118  uGym2 DAP COMPARISON:  None IMPRESSION: 1. Technically successful placement of tunneled right IJ hemodialysis/pheresis catheter with ultrasound and fluoroscopic guidance. Ready for routine use. ACCESS: Remains approachable for percutaneous intervention as needed. Electronically Signed   By: Lucrezia Europe M.D.   On: 06/28/2019 17:06    Medications:    bortezomib SQ  1.3 mg/m2 Subcutaneous Once   feeding supplement (ENSURE ENLIVE)  237 mL Oral BID BM   finasteride  5 mg Oral Daily   heparin injection (subcutaneous)  5,000 Units Subcutaneous Q8H   prochlorperazine  10 mg Oral Once   Continuous Infusions:  therapeutic plasma exchange solution       LOS: 2 days   Geradine Girt  [facility]   How to contact the [facility] Attending or Consulting provider [place] or covering provider during after hours [place], for this patient?  1. Check the care team in  [facility] and look for a) attending/consulting TRH provider listed and b) the [facility] team listed 2. Log into [email or web address] and use [facility]'s universal password to access. If you do not have the password, please contact the hospital operator. 3. Locate the [facility] provider you are looking for under [facility] and page to a number that you can be directly reached. 4. If you still have difficulty reaching the provider, please page the [facility] (Director on Call) for the Hospitalists listed on amion for assistance.  06/29/2019, 10:42 AM

## 2019-06-29 NOTE — Progress Notes (Signed)
Subjective:  Patient seen and evaluated after returning from tunneled HD catheter placement. He is feeling well without any acute complaints. Denies headaches, chest pain, shortness of breath, n/v, lower extremity edema. Having good urine output.    Objective Vital signs in last 24 hours: Vitals:   06/28/19 1625 06/28/19 1657 06/28/19 2137 06/29/19 0403  BP: (!) 142/89 (!) 161/84 (!) 142/83 (!) 142/87  Pulse: (!) 58 (!) 54 (!) 56 (!) 58  Resp: 19 18 18 18   Temp:  97.6 F (36.4 C) 97.6 F (36.4 C) 98.1 F (36.7 C)  TempSrc:  Oral Oral Oral  SpO2: 96% 97% 100% 99%  Weight:      Height:       Weight change:   Intake/Output Summary (Last 24 hours) at 06/29/2019 0955 Last data filed at 06/29/2019 0700 Gross per 24 hour  Intake 360 ml  Output 1700 ml  Net -1340 ml    Assessment/Plan: 1.Chronic kidney disease stage III. His baseline creatinine back in March 2020 was around 1.6. He has progressively worsened. I suspect his chronic kidney disease is on the basis of underlying myeloma kidney.  2. Acute kidney injury. Based on rapid progression, may be in the setting of myeloma kidney. Not recommending biopsy at this time due to likely renal cell carcinoma based on imaging. Oncology is following and appreciate their recommendations. He will undergo plasampheresis, but likelihood of seeing rapid renal improvement is low. He is being started on weekly bortezomib with plan to add cyclophosphamide if he remains stable. With his poor renal function, he is at high risk of requiring HD. He will need to have ongoing [place] discussions, as patient continues to state he would not want to undergo chemo and HD at the same time. Acutely, his renal function remains stable without any urgent indication for dialysis.  3. Multiple myeloma. Suspect significant progression of his disease.Oncology input appreciated. Plan is for plasmapheresis and chemotherapy.   4. Renal cell carcinoma. He looks to  have at least 2 potential lesions, with other smaller potential cystic lesions throughout the kidneys. Will need urology follow-up.  5. Anemia. Will avoid ESA's for now. Transfuse as needed for hgb <8    6. Hypertension. Continue outpatient dose of amlodipine.  7. Hyperlipidemia. Continue atorvastatin.  [name] [name]    Labs: Basic Metabolic Panel: Recent Labs  Lab 06/27/19 0536 06/28/19 0535 06/29/19 0517  NA 138 138 139  K 3.5 3.2* 3.7  CL 103 101 103  CO2 22 23 23   GLUCOSE 122* 85 88  BUN 64* 71* 71*  CREATININE 5.59* 5.56* 5.57*  CALCIUM 8.9 9.2 9.3  PHOS  --  5.2* 5.2*   Liver Function Tests: Recent Labs  Lab 06/23/19 1320 06/25/19 1216 06/25/19 1741 06/28/19 0535 06/29/19 0517  AST 31 25 28   --   --   ALT 44 38 41  --   --   ALKPHOS 173* 178* 161*  --   --   BILITOT 0.6 0.6 0.5  --   --   PROT 7.4 7.2 6.8  --   --   ALBUMIN 3.9 3.8 3.7 3.4* 3.2*   No results for input(s): LIPASE, AMYLASE in the last 168 hours. No results for input(s): AMMONIA in the last 168 hours. CBC: Recent Labs  Lab 06/23/19 1320  06/25/19 1216 06/25/19 1741 06/26/19 0615  06/27/19 0738 06/28/19 0535 06/29/19 0517  WBC 5.2   < > 5.2 5.5 5.2  --  4.8 4.5 5.2  NEUTROABS 3.3  --  3.8  --   --   --   --   --   --   HGB 7.9*   < > 7.8* 7.8* 7.8*   < > 9.4* 8.8* 9.1*  HCT 23.5*  --  23.6* 24.5* 24.1*   < > 27.9* 25.9* 27.7*  MCV 98.3  --  98.3 102.1* 99.6  --  95.9 93.8 97.2  PLT 219   < > 202 226 211  --  161 186 189   < > = values in this interval not displayed.   Cardiac Enzymes: No results for input(s): CKTOTAL, CKMB, CKMBINDEX, TROPONINI in the last 168 hours. CBG: Recent Labs  Lab 06/26/19 2247 06/27/19 0445 06/27/19 0755 06/27/19 1121 06/27/19 2214  GLUCAP 73 67* 166* 86 108*    Iron Studies: No results for input(s): IRON, TIBC, TRANSFERRIN, FERRITIN in the last 72 hours. Studies/Results: Ir Fluoro Guide Cv Line Right  Result Date:  06/28/2019 CLINICAL DATA:  Renal failure, needs tunneled pheresis catheter, with possible future use for hemodialysis. EXAM: TUNNELED PHERESIS/HEMODIALYSIS CATHETER PLACEMENT WITH ULTRASOUND AND FLUOROSCOPIC GUIDANCE TECHNIQUE: The procedure, risks, benefits, and alternatives were explained to the patient. Questions regarding the procedure were encouraged and answered. The patient understands and consents to the procedure. As antibiotic prophylaxis, vancomycin 1 g was ordered pre-procedure and administered intravenously within one hour of incision.Patency of the right IJ vein was confirmed with ultrasound with image documentation. An appropriate skin site was determined. Region was prepped using maximum barrier technique including cap and mask, sterile gown, sterile gloves, large sterile sheet, and Chlorhexidine as cutaneous antisepsis. The region was infiltrated locally with 1% lidocaine. Intravenous Fentanyl 63mg and Versed 149mwere administered as conscious sedation during continuous monitoring of the patient's level of consciousness and physiological / cardiorespiratory status by the radiology RN, with a total moderate sedation time of 10 minutes. Under real-time ultrasound guidance, the right IJ vein was accessed with a 21 gauge micropuncture needle; the needle tip within the vein was confirmed with ultrasound image documentation. Needle exchanged over the 018 guidewire for transitional dilator, which allowed advancement of a Benson wire into the IVC. Over this, an MPA catheter was advanced. A Palindrome 23 hemodialysis catheter was tunneled from the right anterior chest wall approach to the right IJ dermatotomy site. The MPA catheter was exchanged over an Amplatz wire for serial vascular dilators which allow placement of a peel-away sheath, through which the catheter was advanced under intermittent fluoroscopy, positioned with its tips in the proximal and midright atrium. Spot chest radiograph confirms good  catheter position. No pneumothorax. Catheter was flushed and primed per protocol. Catheter secured externally with O Prolene sutures. The right IJ dermatotomy site was closed with Dermabond. COMPLICATIONS: COMPLICATIONS None immediate FLUOROSCOPY TIME:  0.9 minute; 118  uGym2 DAP COMPARISON:  None IMPRESSION: 1. Technically successful placement of tunneled right IJ hemodialysis/pheresis catheter with ultrasound and fluoroscopic guidance. Ready for routine use. ACCESS: Remains approachable for percutaneous intervention as needed. Electronically Signed   By: D Lucrezia Europe.D.   On: 06/28/2019 17:06   Ir UsKoreauide Vasc Access Right  Result Date: 06/28/2019 CLINICAL DATA:  Renal failure, needs tunneled pheresis catheter, with possible future use for hemodialysis. EXAM: TUNNELED PHERESIS/HEMODIALYSIS CATHETER PLACEMENT WITH ULTRASOUND AND FLUOROSCOPIC GUIDANCE TECHNIQUE: The procedure, risks, benefits, and alternatives were explained to the patient. Questions regarding the procedure were encouraged and answered. The patient understands and consents to the procedure. As antibiotic prophylaxis, vancomycin 1 g  was ordered pre-procedure and administered intravenously within one hour of incision.Patency of the right IJ vein was confirmed with ultrasound with image documentation. An appropriate skin site was determined. Region was prepped using maximum barrier technique including cap and mask, sterile gown, sterile gloves, large sterile sheet, and Chlorhexidine as cutaneous antisepsis. The region was infiltrated locally with 1% lidocaine. Intravenous Fentanyl 35mg and Versed 14mwere administered as conscious sedation during continuous monitoring of the patient's level of consciousness and physiological / cardiorespiratory status by the radiology RN, with a total moderate sedation time of 10 minutes. Under real-time ultrasound guidance, the right IJ vein was accessed with a 21 gauge micropuncture needle; the needle tip  within the vein was confirmed with ultrasound image documentation. Needle exchanged over the 018 guidewire for transitional dilator, which allowed advancement of a Benson wire into the IVC. Over this, an MPA catheter was advanced. A Palindrome 23 hemodialysis catheter was tunneled from the right anterior chest wall approach to the right IJ dermatotomy site. The MPA catheter was exchanged over an Amplatz wire for serial vascular dilators which allow placement of a peel-away sheath, through which the catheter was advanced under intermittent fluoroscopy, positioned with its tips in the proximal and midright atrium. Spot chest radiograph confirms good catheter position. No pneumothorax. Catheter was flushed and primed per protocol. Catheter secured externally with O Prolene sutures. The right IJ dermatotomy site was closed with Dermabond. COMPLICATIONS: COMPLICATIONS None immediate FLUOROSCOPY TIME:  0.9 minute; 118  uGym2 DAP COMPARISON:  None IMPRESSION: 1. Technically successful placement of tunneled right IJ hemodialysis/pheresis catheter with ultrasound and fluoroscopic guidance. Ready for routine use. ACCESS: Remains approachable for percutaneous intervention as needed. Electronically Signed   By: D Lucrezia Europe.D.   On: 06/28/2019 17:06   Medications: Infusions: . therapeutic plasma exchange solution      Scheduled Medications: . bortezomib SQ  1.3 mg/m2 Subcutaneous Once  . feeding supplement (ENSURE ENLIVE)  237 mL Oral BID BM  . finasteride  5 mg Oral Daily  . heparin injection (subcutaneous)  5,000 Units Subcutaneous Q8H    have reviewed scheduled and prn medications.  Physical Exam: General: awake, alert, sitting up in bed in NAD Heart: RRR Lungs: RIJ catheter site c/d/i; lungs CTAB  Abdomen: BS+. Abdomen soft, non-tender, non-distended  Extremities: trace BLE edema   06/29/2019,9:55 AM  LOS: 2 days

## 2019-06-30 ENCOUNTER — Encounter (HOSPITAL_COMMUNITY): Payer: Medicare Other

## 2019-06-30 DIAGNOSIS — C9000 Multiple myeloma not having achieved remission: Secondary | ICD-10-CM | POA: Diagnosis not present

## 2019-06-30 DIAGNOSIS — N08 Glomerular disorders in diseases classified elsewhere: Secondary | ICD-10-CM | POA: Diagnosis not present

## 2019-06-30 LAB — RENAL FUNCTION PANEL
Albumin: 4.5 g/dL (ref 3.5–5.0)
Anion gap: 10 (ref 5–15)
BUN: 71 mg/dL — ABNORMAL HIGH (ref 8–23)
CO2: 20 mmol/L — ABNORMAL LOW (ref 22–32)
Calcium: 8.7 mg/dL — ABNORMAL LOW (ref 8.9–10.3)
Chloride: 109 mmol/L (ref 98–111)
Creatinine, Ser: 5.27 mg/dL — ABNORMAL HIGH (ref 0.61–1.24)
GFR calc Af Amer: 11 mL/min — ABNORMAL LOW (ref >60)
GFR calc non Af Amer: 10 mL/min — ABNORMAL LOW (ref >60)
Glucose, Bld: 120 mg/dL — ABNORMAL HIGH (ref 70–99)
Phosphorus: 4.4 mg/dL (ref 2.5–4.6)
Potassium: 4.3 mmol/L (ref 3.5–5.1)
Sodium: 139 mmol/L (ref 135–145)

## 2019-06-30 LAB — CBC
HCT: 33.4 % — ABNORMAL LOW (ref 39.0–52.0)
Hemoglobin: 10.6 g/dL — ABNORMAL LOW (ref 13.0–17.0)
MCH: 31.8 pg (ref 26.0–34.0)
MCHC: 31.7 g/dL (ref 30.0–36.0)
MCV: 100.3 fL — ABNORMAL HIGH (ref 80.0–100.0)
Platelets: 199 10*3/uL (ref 150–400)
RBC: 3.33 MIL/uL — ABNORMAL LOW (ref 4.22–5.81)
RDW: 13 % (ref 11.5–15.5)
WBC: 6.6 10*3/uL (ref 4.0–10.5)
nRBC: 0 % (ref 0.0–0.2)

## 2019-06-30 LAB — POCT I-STAT, CHEM 8
BUN: 72 mg/dL — ABNORMAL HIGH (ref 8–23)
Calcium, Ion: 1.26 mmol/L (ref 1.15–1.40)
Chloride: 103 mmol/L (ref 98–111)
Creatinine, Ser: 5.3 mg/dL — ABNORMAL HIGH (ref 0.61–1.24)
Glucose, Bld: 132 mg/dL — ABNORMAL HIGH (ref 70–99)
HCT: 31 % — ABNORMAL LOW (ref 39.0–52.0)
Hemoglobin: 10.5 g/dL — ABNORMAL LOW (ref 13.0–17.0)
Potassium: 3.9 mmol/L (ref 3.5–5.1)
Sodium: 139 mmol/L (ref 135–145)
TCO2: 23 mmol/L (ref 22–32)

## 2019-06-30 MED ORDER — HEPARIN SODIUM (PORCINE) 1000 UNIT/ML IJ SOLN
1000.0000 [IU] | Freq: Once | INTRAMUSCULAR | Status: DC
  Filled 2019-06-30: qty 1

## 2019-06-30 MED ORDER — ACD FORMULA A 0.73-2.45-2.2 GM/100ML VI SOLN
Status: AC
  Filled 2019-06-30: qty 500

## 2019-06-30 MED ORDER — ACETAMINOPHEN 325 MG PO TABS: 650.0000 mg | ORAL_TABLET | ORAL | Status: DC | PRN

## 2019-06-30 MED ORDER — DIPHENHYDRAMINE HCL 25 MG PO CAPS: 25.0000 mg | ORAL_CAPSULE | Freq: Four times a day (QID) | ORAL | Status: DC | PRN

## 2019-06-30 MED ORDER — HEPARIN SODIUM (PORCINE) 1000 UNIT/ML IJ SOLN
INTRAMUSCULAR | Status: AC
  Administered 2019-06-30: 3800 [IU]
  Filled 2019-06-30: qty 4

## 2019-06-30 MED ORDER — SODIUM CHLORIDE 0.9 % IV SOLN
INTRAVENOUS | Status: AC
  Administered 2019-06-30 (×4): via INTRAVENOUS_CENTRAL
  Filled 2019-06-30 (×7): qty 200

## 2019-06-30 MED ORDER — CALCIUM CARBONATE ANTACID 500 MG PO CHEW
2.0000 | CHEWABLE_TABLET | ORAL | Status: DC
  Administered 2019-06-30: 400 mg via ORAL
  Filled 2019-06-30: qty 2

## 2019-06-30 MED ORDER — ACD FORMULA A 0.73-2.45-2.2 GM/100ML VI SOLN
500.0000 mL | Status: DC
  Administered 2019-06-30: 10:00:00 500 mL via INTRAVENOUS
  Filled 2019-06-30 (×2): qty 500

## 2019-06-30 MED ORDER — SODIUM CHLORIDE 0.9 % IV SOLN
2.0000 g | Freq: Once | INTRAVENOUS | Status: AC
  Administered 2019-06-30: 2 g via INTRAVENOUS
  Filled 2019-06-30 (×2): qty 20

## 2019-06-30 MED ORDER — CHLORHEXIDINE GLUCONATE CLOTH 2 % EX PADS
6.0000 | MEDICATED_PAD | Freq: Every day | CUTANEOUS | Status: DC
  Administered 2019-06-30 – 2019-07-01 (×2): 6 via TOPICAL

## 2019-06-30 NOTE — Progress Notes (Signed)
PROGRESS NOTE  [name] [name] IRC:[id number] DOB: 06/06/1946 DOA: 06/25/2019 PCP: Scot Jun, FNP  Brief History   [name] [name] is an 73 y.o. male with medical history significant ofBPH, paroxysmal atrial fibrillation, CAD, chronic diastolic congestive heart failure, CKD stage III, colon cancer, light chain myeloma, renal cell carcinoma, COPD, type 2 diabetes, hypertension, hyperlipidemia being sent to the ED by his oncologistDr. Lillard Anes he was noted to have worsening of his renal function with creatinine up to 5.7. He also reported symptoms concerning for urinary retention.Patient states he has had problems emptying his bladder fully and is seen by urology. He feels this problem is now getting worse. It takes multiple attempts for him to fully empty his bladder. States he was seen by urology a week ago as he was noticing blood in his urine. States he is supposed to see nephrology for his kidney problem but does not have an appointment yet.  Now with catheter if HD needed- plan per oncology is to start his treatment and monitor Cr.   Consultants   Medical oncology  Nephrology  Procedures   Plasmapheresis  Velcade infusions  Antibiotics   Anti-infectives (From admission, onward)   Start     Dose/Rate Route Frequency Ordered Stop   06/28/19 1601  vancomycin (VANCOCIN) 1-5 GM/200ML-% IVPB    Note to Pharmacy: Manuela Neptune   : cabinet override      06/28/19 1601 06/29/19 0414       Subjective  The patient is seen following plasmapheresis. No new complaints.  Objective   Vitals:  Vitals:   06/30/19 1122 06/30/19 1206  BP: (!) 156/87 (!) 145/92  Pulse: 65 72  Resp: 18 18  Temp: 97.6 F (36.4 C) (!) 97.5 F (36.4 C)  SpO2: 100% 100%   Exam:  Constitutional:   The patient is awake, alert, and oriented x 3. No acute distress. Respiratory:   No increased work of breathing.  No wheezes, rales, or rhonchi  No tactile fremitus Cardiovascular:    Regular rate and rhythm  No murmurs, ectopy, or gallups.  No lateral PMI. No thrills. Abdomen:   Abdomen is soft, non-tender, non-distended  No hernias, masses, or organomegaly  Normoactive bowel sounds.  Musculoskeletal:   No cyanosis or clubbing  1-2+ pitting edema of lower extremities bilaterally. Skin:   No rashes, lesions, ulcers  palpation of skin: no induration or nodules Neurologic:   CN 2-12 intact  Sensation all 4 extremities intact Psychiatric:   Mental status o Mood, affect appropriate o Orientation to person, place, time   judgment and insight appear intact  I have personally reviewed the following:   Today's Data   Vitals, BMP, CBC   Scheduled Meds:  Chlorhexidine Gluconate Cloth  6 each Topical Daily   feeding supplement (ENSURE ENLIVE)  237 mL Oral BID BM   finasteride  5 mg Oral Daily   heparin injection (subcutaneous)  5,000 Units Subcutaneous Q8H   prochlorperazine  10 mg Oral Once   Continuous Infusions:  Principal Problem:   AKI (acute kidney injury) ([place]) Active Problems:   BPH (benign prostatic hyperplasia)   Diabetes (HCC)   Light chain myeloma (HCC)   CKD (chronic kidney disease), stage III   Myeloma kidney (HCC)   LOS: 3 days   A & P  AKI on CKD 3/progressively worsening renal function with myeloma: Baseline creatinine 1.5-1.6 in March 2020 and has been progressively worsening since then. Renal ultrasound without hydronephrosis. The patient is receiving weekly  Velcade now. Cytoxam may be added next week if possible. The patient has undergone plasmapheresis again today. The patient will receive one more treatment. Nephrology is following closely as this patient is quite likely to require HD. He is also at high risk of Tumor Lysis Syndrome with myeloma treatment. The patient has temporary dialysis catheter placed for treatment should it be needed. He will have vein mapping done prior to discharge. He is currently in  agreement to HD in the future, although his compliance and follow up reliability has varied in the past. Nephrology and Oncology consulted.  Anemia: Symptomatic and due to multiple myeloma and renal failure. The patient has received 1 unit of PRBC's in transfusion. He will be transfused for hemoglobin less than 8.0.  BPH: Noacute urinary retention noted. Ultrasound showing marked enlargement of the prostate gland causing mass-effect upon the bladder. Continue home finasteride and monitor for signs of retention. I/O if needed and place foley if continues to have issues.  Renal mass/concern for renal cell carcinoma: Oncology and urology follow-up. Likely nephrectomy in the future.  Type 2 diabetes: A1c 5.2. Actual DM II seems unlikely as this A1c is on no medication. Monitor.  Hypertension: HydralazinePRN SBP >150.  Obesity: Body mass index is 32.32 kg/m.   I have seen and examined this patient myself. I have spent 34 minutes in his evaluation and care.  DVT prophylaxis:heparin Code Status: Full Code.  Family Communication:  Disposition Plan: suspect will be in hospital for several days getting treatment for myeloma  Mallori Araque, DO [facility] Direct contact: see [email or web address]  7PM-7AM contact night coverage as above 06/30/2019, 5:24 PM  LOS: 3 days

## 2019-06-30 NOTE — Progress Notes (Signed)
HEMATOLOGY-ONCOLOGY PROGRESS NOTE DOS 06/29/2019  SUBJECTIVE:   Patient was seen in follow-up in the hemodialysis inpatient center receiving his first session of plasmapheresis.  He noted no acute new symptoms.  Notes energy levels are improved after his PRBC transfusion. Received his dose of Velcade earlier this afternoon without any overt side effects. Nephrology is following to monitor his renal function. He is aware of his complex medical issues involved and concerning prognosis.  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[place])  12/17/2015 Initial Diagnosis   Multiple myeloma ([place])   07/03/2018 -  Chemotherapy   The patient had palonosetron (ALOXI) injection 0.25 mg, 0.25 mg, Intravenous,  Once, 2 of 2 cycles Administration: 0.25 mg (07/03/2018), 0.25 mg (07/17/2018), 0.25 mg (07/24/2018), 0.25 mg (08/07/2018), 0.25 mg (09/18/2018) bortezomib SQ (VELCADE) chemo injection 3.25 mg, 1.5 mg/m2 = 3.25 mg, Subcutaneous,  Once, 3 of 6 cycles Dose modification: 1.5 mg/m2 (original dose 1.5 mg/m2, Cycle 3, Reason: Provider Judgment) Administration: 3.25 mg (07/03/2018), 3.25 mg (07/17/2018), 3.25 mg (07/24/2018), 3.25 mg (08/07/2018), 3.25 mg (09/18/2018) cyclophosphamide (CYTOXAN) 660 mg in sodium chloride 0.9 % 250 mL chemo infusion, 300 mg/m2 = 660 mg, Intravenous,  Once, 3 of 6 cycles Administration: 660 mg (07/03/2018), 660 mg (07/17/2018), 660 mg (07/24/2018), 660 mg (08/07/2018), 660 mg (09/18/2018)  for chemotherapy treatment.       REVIEW OF SYSTEMS:   As noted in the HPI.  I have reviewed the past medical history, past surgical history, social history and family history with the patient and they are unchanged from previous note.   PHYSICAL EXAMINATION: ECOG PERFORMANCE STATUS: 2 - Symptomatic, <50% confined to bed  Vitals:   06/30/19 1122 06/30/19 1206  BP: (!) 156/87 (!) 145/92  Pulse: 65 72  Resp: 18 18  Temp: 97.6 F (36.4 C) (!) 97.5 F (36.4 C)  SpO2: 100% 100%   Filed  Weights   06/26/19 0433 06/30/19 0005  Weight: 206 lb 5.6 oz (93.6 kg) 209 lb 10.5 oz (95.1 kg)    Intake/Output from previous day: 10/27 0701 - 10/28 0700 In: 360 [P.O.:360] Out: 400 [Urine:400]  GENERAL:alert, no distress and comfortable SKIN: skin color, texture, turgor are normal, no rashes or significant lesions EYES: normal, Conjunctiva are pink and non-injected, sclera clear OROPHARYNX:no exudate, no erythema and lips, buccal mucosa, and tongue normal  NECK: supple, thyroid normal size, non-tender, without nodularity LYMPH:  no palpable lymphadenopathy in the cervical, axillary or inguinal LUNGS: clear to auscultation and percussion with normal breathing effort HEART: regular rate & rhythm and no murmurs and trace pedal edema ABDOMEN:abdomen soft, non-tender and normal bowel sounds Musculoskeletal:no cyanosis of digits and no clubbing  NEURO: alert & oriented x 3 with fluent speech, no focal motor/sensory deficits  LABORATORY DATA:  I have reviewed the data as listed CMP Latest Ref Rng & Units 06/29/2019 06/29/2019  Glucose 70 - 99 mg/dL 87 88  BUN 8 - 23 mg/dL 77(H) 71(H)  Creatinine 0.61 - 1.24 mg/dL 5.40(H) 5.57(H)  Sodium 135 - 145 mmol/L 137 139  Potassium 3.5 - 5.1 mmol/L 3.7 3.7  Chloride 98 - 111 mmol/L 101 103  CO2 22 - 32 mmol/L - 23  Calcium 8.9 - 10.3 mg/dL - 9.3  Total Protein 6.5 - 8.1 g/dL - -  Total Bilirubin 0.3 - 1.2 mg/dL - -  Alkaline Phos 38 - 126 U/L - -  AST 15 - 41 U/L - -  ALT 0 - 44 U/L - -   . CBC  Latest Ref Rng & Units 06/29/2019 06/29/2019  WBC 4.0 - 10.5 K/uL 5.2 -  Hemoglobin 13.0 - 17.0 g/dL 9.3(L) 9.5(L)  Hematocrit 39.0 - 52.0 % 27.7(L) 28.0(L)  Platelets 150 - 400 K/uL 199 -    Ct Abdomen Pelvis Wo Contrast  Result Date: 06/01/2019 CLINICAL DATA:  Status post partial right colectomy 05/21/2018. Incisional hernia evaluation. Additional history of prostate cancer. EXAM: CT ABDOMEN AND PELVIS WITHOUT CONTRAST TECHNIQUE:  Multidetector CT imaging of the abdomen and pelvis was performed following the standard protocol without IV contrast. Creatinine was obtained on site at [place] at [address] Results: Creatinine 4.1 mg/dL. COMPARISON:  08/19/2018 PET-CT.  06/05/2018 CT abdomen/pelvis. FINDINGS: Lower chest: No significant pulmonary nodules or acute consolidative airspace disease. Cardiomegaly. Coronary atherosclerosis. Hepatobiliary: Normal liver size. Simple 1.0 cm right liver dome cyst. Several subcentimeter hypodense liver lesions scattered throughout the liver, too small to characterize, not appreciably changed since 06/05/2018 CT, probably benign. No appreciable new liver lesions. Normal gallbladder with no radiopaque cholelithiasis. No biliary ductal dilatation. Pancreas: Normal, with no mass or duct dilation. Spleen: Normal size. No mass. Adrenals/Urinary Tract: Normal adrenals. No renal stones. No hydronephrosis. Dominant 7.2 x 6.2 cm posterior upper left renal cortical mass with density 29 HU (series 2/image 23), increased from 4.6 x 4.8 cm on 06/05/2018 CT. Mixed attenuation 5.1 x 4.2 cm renal cortical mass in the medial lower right kidney (series 2/image 37), previously 5.0 x 4.0 cm on 06/05/2018 CT, not significantly changed. Numerous additional hyperdense renal cortical masses in both kidneys, largest 3.5 cm in the interpolar right kidney (series 2/image 30), increased from 2.8 cm. Several simple renal cysts scattered in both kidneys, largest 4.2 cm in medial interpolar left kidney. Chronic mild diffuse bladder wall thickening is unchanged. No significant bladder distention. No bladder stones. Stomach/Bowel: Normal non-distended stomach. Status post subtotal right hemicolectomy with ileocolic anastomosis in the right abdomen. No small bowel dilatation or wall thickening. Oral contrast transits to the rectum. Mild scattered diverticulosis throughout the remnant large-bowel, with no definite large  bowel wall thickening or acute pericolonic fat stranding. There is prominent diastasis of the midline supraumbilical ventral abdominal wall (series 2/image 27) containing a portion of the transverse colon. There is a tiny umbilical hernia containing a portion of a small bowel loop. Vascular/Lymphatic: Atherosclerotic abdominal aorta with ectatic 2.8 cm infrarenal abdominal aorta. 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 No pathologically enlarged lymph nodes in the abdomen or pelvis. Reproductive: Markedly enlarged prostate with mass-effect on the bladder base with nonspecific internal prostatic calcifications, not appreciably changed. Other: No pneumoperitoneum, ascites or focal fluid collection. Musculoskeletal: No aggressive appearing focal osseous lesions. Marked thoracolumbar spondylosis. IMPRESSION: 1. Significant interval growth of 7.2 cm posterior upper left renal cortical mass, which was hypermetabolic on 24/40/1027 PET-CT, and which was enhancing on 03/24/2018 CT abdomen/pelvis. 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 Urology consultation advised. 2. Numerous additional complex hyperdense bilateral renal masses are incompletely evaluated on this noncontrast study. Additional renal cell carcinomas cannot be excluded. MRI abdomen without and with IV contrast is indicated for further evaluation. 3. Prominent diastasis of the midline supraumbilical ventral abdominal wall, containing a portion of the transverse colon. 4. Separate tiny umbilical hernia contains a portion of a small bowel loop. No acute bowel complication. 5. Cardiomegaly.  Coronary atherosclerosis. 6. Chronic mild diffuse bladder wall thickening, probably due to chronic bladder outlet obstruction by the markedly enlarged prostate. No  hydronephrosis. 7. 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 Recommend follow-up aortic ultrasound in 5  years. This recommendation follows ACR consensus guidelines: White Paper of the ACR Incidental Findings Committee II on Vascular Findings. J Am Coll Radiol 2013; 10:789-794. 8.  Aortic Atherosclerosis (ICD10-I70.0). These results will be called to the ordering clinician or representative by the Radiologist Assistant, and communication documented in the PACS or zVision Dashboard. Patient's acute renal failure as detected by creatinine testing prior to the scan was called by the CT technologist to triage RN Abigail Butts in the provider's office. Electronically Signed   By: Ilona Sorrel M.D.   On: 06/01/2019 14:00   US Renal  Result Date: 06/26/2019 CLINICAL DATA:  Acute kidney injury. EXAM: RENAL / URINARY TRACT ULTRASOUND COMPLETE COMPARISON:  06/04/2019 FINDINGS: Right Kidney: Renal measurements: 10.9 x 4.9 x 6.1 cm. = volume: 170 mL. Increased cortical echogenicity. No hydronephrosis. Multiple right kidney cysts. The dominant cyst is complex with solid and cystic components arising from the lower pole measuring 3.8 x 4.0 x 4.3 cm. Left Kidney: Renal measurements: 12.0 x 6.3 x 6.4 cm. = volume: 249.3 mL. Increased echogenicity. No hydronephrosis. Multiple lesions within the left kidney are again identified. The dominant lesion appears solid and arises from the upper pole of the left kidney measuring 7.6 x 5.5 x 6.5 cm. Other lesions have imaging features of simple cysts. Bladder: There is marked enlargement of the prostate gland which has mass effect upon the bladder. Other: None IMPRESSION: Bilateral increased renal cortical echogenicity compatible with chronic medical renal disease. No hydronephrosis. Multiple bilateral simple appearing and complex kidney lesions. Solid and cystic lesion within the inferior pole of the right kidney is noted measuring 4.3 cm. Cystic renal cell carcinoma cannot be excluded. A solid-appearing lesion arises from the upper pole of the left kidney measuring 7.6 cm. As mentioned on  previous imaging findings are concerning for renal cell carcinoma. Prostate gland enlargement. Electronically Signed   By: Kerby Moors M.D.   On: 06/26/2019 04:38   US Renal  Result Date: 06/04/2019 CLINICAL DATA:  Acute kidney injury. EXAM: RENAL / URINARY TRACT ULTRASOUND COMPLETE COMPARISON:  Abdominopelvic CT 06/01/2019. Renal ultrasound 06/06/2018. PET-CT 08/19/2018. FINDINGS: Right Kidney: Renal measurements: 13.0 x 5.6 x 4.9 cm = volume: 185.1 mL. There is a simple appearing cystic lesion in the upper interpolar region measuring 3.0 x 2.6 x 2.7 cm. In the lower pole, there is a mildly septated cystic lesion measuring 4.2 x 3.3 x 3.7 cm. Numerous other smaller cystic lesions are present. No hydronephrosis. Left Kidney: Renal measurements: 10.5 x 6.2 x 4.4 cm = volume: 151 mL. The known solid mass in the upper pole which was hypermetabolic on previous PET-CT shows continued enlargement as seen on earlier CT. This now measures 7.3 x 6.0 x 8.2 cm (4.9 x 4.5 x 4.5 cm on previous ultrasound of 1 year ago). Color Doppler analysis of this lesion is suboptimal. There are additional cystic lesions in the interpolar region, measuring up to 4.2 and 3.5 cm respectively. No hydronephrosis. Bladder: Median lobe prostate hypertrophy protrudes into the bladder lumen as seen on previous studies. There is a separate filling defect which was incompletely evaluated by this examination, probably reflecting blood clot. Patient was unable to complete the examination, needing to void. IMPRESSION: 1. Known solid mass involving the upper pole of the left kidney shows continued enlargement as seen on earlier CT scan. This remains consistent with neoplasm, likely renal cell  carcinoma. Please refer to recommendations on CT scan performed 06/01/2019. 2. Additional simple and complex cystic lesions bilaterally. No hydronephrosis. 3. Filling defect in the urinary bladder lumen adjacent to an enlarged median lobe, probably blood clot.  This was incompletely evaluated by this examination. Electronically Signed   By: Richardean Sale M.D.   On: 06/04/2019 11:38   Ir Fluoro Guide Cv Line Right  Result Date: 06/28/2019 CLINICAL DATA:  Renal failure, needs tunneled pheresis catheter, with possible future use for hemodialysis. EXAM: TUNNELED PHERESIS/HEMODIALYSIS CATHETER PLACEMENT WITH ULTRASOUND AND FLUOROSCOPIC GUIDANCE TECHNIQUE: The procedure, risks, benefits, and alternatives were explained to the patient. Questions regarding the procedure were encouraged and answered. The patient understands and consents to the procedure. As antibiotic prophylaxis, vancomycin 1 g was ordered pre-procedure and administered intravenously within one hour of incision.Patency of the right IJ vein was confirmed with ultrasound with image documentation. An appropriate skin site was determined. Region was prepped using maximum barrier technique including cap and mask, sterile gown, sterile gloves, large sterile sheet, and Chlorhexidine as cutaneous antisepsis. The region was infiltrated locally with 1% lidocaine. Intravenous Fentanyl 37mg and Versed 149mwere administered as conscious sedation during continuous monitoring of the patient's level of consciousness and physiological / cardiorespiratory status by the radiology RN, with a total moderate sedation time of 10 minutes. Under real-time ultrasound guidance, the right IJ vein was accessed with a 21 gauge micropuncture needle; the needle tip within the vein was confirmed with ultrasound image documentation. Needle exchanged over the 018 guidewire for transitional dilator, which allowed advancement of a Benson wire into the IVC. Over this, an MPA catheter was advanced. A Palindrome 23 hemodialysis catheter was tunneled from the right anterior chest wall approach to the right IJ dermatotomy site. The MPA catheter was exchanged over an Amplatz wire for serial vascular dilators which allow placement of a peel-away  sheath, through which the catheter was advanced under intermittent fluoroscopy, positioned with its tips in the proximal and midright atrium. Spot chest radiograph confirms good catheter position. No pneumothorax. Catheter was flushed and primed per protocol. Catheter secured externally with O Prolene sutures. The right IJ dermatotomy site was closed with Dermabond. COMPLICATIONS: COMPLICATIONS None immediate FLUOROSCOPY TIME:  0.9 minute; 118  uGym2 DAP COMPARISON:  None IMPRESSION: 1. Technically successful placement of tunneled right IJ hemodialysis/pheresis catheter with ultrasound and fluoroscopic guidance. Ready for routine use. ACCESS: Remains approachable for percutaneous intervention as needed. Electronically Signed   By: D Lucrezia Europe.D.   On: 06/28/2019 17:06   Ir UsKoreauide Vasc Access Right  Result Date: 06/28/2019 CLINICAL DATA:  Renal failure, needs tunneled pheresis catheter, with possible future use for hemodialysis. EXAM: TUNNELED PHERESIS/HEMODIALYSIS CATHETER PLACEMENT WITH ULTRASOUND AND FLUOROSCOPIC GUIDANCE TECHNIQUE: The procedure, risks, benefits, and alternatives were explained to the patient. Questions regarding the procedure were encouraged and answered. The patient understands and consents to the procedure. As antibiotic prophylaxis, vancomycin 1 g was ordered pre-procedure and administered intravenously within one hour of incision.Patency of the right IJ vein was confirmed with ultrasound with image documentation. An appropriate skin site was determined. Region was prepped using maximum barrier technique including cap and mask, sterile gown, sterile gloves, large sterile sheet, and Chlorhexidine as cutaneous antisepsis. The region was infiltrated locally with 1% lidocaine. Intravenous Fentanyl 2514mand Versed 1mg49mre administered as conscious sedation during continuous monitoring of the patient's level of consciousness and physiological / cardiorespiratory status by the radiology  RN, with a total moderate sedation time of  10 minutes. Under real-time ultrasound guidance, the right IJ vein was accessed with a 21 gauge micropuncture needle; the needle tip within the vein was confirmed with ultrasound image documentation. Needle exchanged over the 018 guidewire for transitional dilator, which allowed advancement of a Benson wire into the IVC. Over this, an MPA catheter was advanced. A Palindrome 23 hemodialysis catheter was tunneled from the right anterior chest wall approach to the right IJ dermatotomy site. The MPA catheter was exchanged over an Amplatz wire for serial vascular dilators which allow placement of a peel-away sheath, through which the catheter was advanced under intermittent fluoroscopy, positioned with its tips in the proximal and midright atrium. Spot chest radiograph confirms good catheter position. No pneumothorax. Catheter was flushed and primed per protocol. Catheter secured externally with O Prolene sutures. The right IJ dermatotomy site was closed with Dermabond. COMPLICATIONS: COMPLICATIONS None immediate FLUOROSCOPY TIME:  0.9 minute; 118  uGym2 DAP COMPARISON:  None IMPRESSION: 1. Technically successful placement of tunneled right IJ hemodialysis/pheresis catheter with ultrasound and fluoroscopic guidance. Ready for routine use. ACCESS: Remains approachable for percutaneous intervention as needed. Electronically Signed   By: Lucrezia Europe M.D.   On: 06/28/2019 17:06   Dg Foot 2 Views Left  Result Date: 06/03/2019 CLINICAL DATA:  One week of great toe swelling and pain EXAM: LEFT FOOT - 2 VIEW COMPARISON:  None. FINDINGS: Soft tissue swelling medial to the first MTP joint with soft tissue calcification. Mild hallux valgus. No definite juxta articular or marginal erosion. IMPRESSION: Swelling and soft tissue calcification at the first MTP joint, suspect gout. Electronically Signed   By: Monte Fantasia M.D.   On: 06/03/2019 05:40    ASSESSMENT AND PLAN: 73 y.o. male  with  1) Progressive Kappa light chain Multiple Myeloma -- with likely progression since patient abandoned treatment earlier this year.   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  Some concern with urinary retention.  3) Anemia due to CKD, myeloma , RCC  4) RCC - 7.6 cms in upper pole of rt kidney.  PLAN -Patient did not have any prohibitive toxicities from his first dose of Velcade 1.5 mg/m that he received on 10/27 -If labs stable will add Cytoxan from next week. -Tolerating first session of plasmapheresis well today with no immediate concerns. -We will plan to do 3 sessions of plasmapheresis to reduce light chain burden. --transfuse prn for hgb<8 -High likelihood of renal and fluid overload related decompensation when steroids are added as a part of his treatment. -Will likely need to use a lower dose of steroids. -We will plan to start on acyclovir in the next several days for VZV prophylaxis in the setting of Velcade therapy.  Holding currently to avoid adding additional factors causing renal decompensation. -Will need stabilization of his renal function/decision on dialysis and stability and some improvement in his myeloma function prior to consideration of nephrectomy. -It is likely given current trend of renal function the nephrectomy will likely have a high chance of making him dialysis dependent. -high likelihood of needing HD and increased risk of TLS with myeloma treatment. -Will need close nephrology follow-up inpatient and outpatient. -We will continue to follow as inpatient   Sullivan Lone MD MS Hematology/Oncology Physician [facility]  . The total time spent in the appointment was 40 minutes and more than 50% was on counseling and direct patient cares.

## 2019-06-30 NOTE — Care Management Important Message (Signed)
Important Message  Patient Details  Name: [name] [name] MRN: [id number] Date of Birth: Apr 25, 1946   Medicare Important Message Given:  Yes     [name] 06/30/2019, 2:46 PM

## 2019-06-30 NOTE — Progress Notes (Signed)
Subjective:  Patient seen and evaluated while receiving plasmapheresis. He is feeling well without any acute concerns. No headaches, chest pain, shortness of breath, n/v, abdominal pain, constipation. Endorses good appetite and urine output.   Objective Vital signs in last 24 hours: Vitals:   06/30/19 1005 06/30/19 1016 06/30/19 1039 06/30/19 1101  BP: (!) 154/88 (!) 154/96 (!) 142/87 (!) 176/92  Pulse: 73 71 68 75  Resp: 17 18 20 17   Temp: 98.1 F (36.7 C)   98.2 F (36.8 C)  TempSrc: Oral   Oral  SpO2: 100%     Weight:      Height:       Weight change:   Intake/Output Summary (Last 24 hours) at 06/30/2019 1131 Last data filed at 06/30/2019 0913 Gross per 24 hour  Intake 360 ml  Output 750 ml  Net -390 ml    Assessment/ Plan: 1.Chronic kidney disease stage III. His baseline creatinine back in March 2020 was around 1.6. He has progressively worsened. Suspect his chronic kidney disease is on the basis of underlying myeloma kidney.  2. Acute kidney injury. Based on rapid progression, may be in the setting of myeloma kidney. Not recommending biopsy at this time due to likely renal cell carcinoma based on imaging. Oncology is following and appreciate their recommendations. He is undergoing plasmapheresis as part of myeloma treatment which should hopefully improve renal function somewhat, but likelihood of seeing rapid renal improvement is low. He is being started on weekly bortezomib with plan to add cyclophosphamide if he remains stable. With his poor renal function, he is at high risk of requiring HD. He will need to have ongoing [place] discussions, as patient continues to state he would not want to undergo chemo and HD at the same time. Acutely, he does not have any signs of uremia or other urgent indications for dialysis. Will order vein mapping and continue to have ongoing discussions with him regarding HD.   3. Multiple myeloma. Suspect significant progression of his  disease.Oncology input appreciated. Plan is for plasmapheresis and chemotherapy.   4. Renal cell carcinoma. He looks to have at least 2 potential lesions, with other smaller potential cystic lesions throughout the kidneys. Will need urology follow-up.  5. Anemia. Will avoid ESA's for now. Transfuse as needed for hgb <8    6. Hypertension. Continue outpatient dose of amlodipine.  7. Hyperlipidemia. Continue atorvastatin.  Delice Bison    Labs: Basic Metabolic Panel: Recent Labs  Lab 06/27/19 0536 06/28/19 0535 06/29/19 0517 06/29/19 1558 06/30/19 1024  NA 138 138 139 137 139  K 3.5 3.2* 3.7 3.7 3.9  CL 103 101 103 101 103  CO2 22 23 23   --   --   GLUCOSE 122* 85 88 87 132*  BUN 64* 71* 71* 77* 72*  CREATININE 5.59* 5.56* 5.57* 5.40* 5.30*  CALCIUM 8.9 9.2 9.3  --   --   PHOS  --  5.2* 5.2*  --   --    Liver Function Tests: Recent Labs  Lab 06/23/19 1320 06/25/19 1216 06/25/19 1741 06/28/19 0535 06/29/19 0517  AST 31 25 28   --   --   ALT 44 38 41  --   --   ALKPHOS 173* 178* 161*  --   --   BILITOT 0.6 0.6 0.5  --   --   PROT 7.4 7.2 6.8  --   --   ALBUMIN 3.9 3.8 3.7 3.4* 3.2*   No results for input(s): LIPASE,  AMYLASE in the last 168 hours. No results for input(s): AMMONIA in the last 168 hours. CBC: Recent Labs  Lab 06/23/19 1320 06/25/19 1216  06/26/19 0615  06/27/19 0738 06/28/19 0535 06/29/19 0517 06/29/19 1558 06/29/19 1623 06/30/19 1024  WBC 5.2 5.2   < > 5.2  --  4.8 4.5 5.2  --  5.2  --   NEUTROABS 3.3 3.8  --   --   --   --   --   --   --  3.5  --   HGB 7.9* 7.8*   < > 7.8*   < > 9.4* 8.8* 9.1* 9.5* 9.3* 10.5*  HCT 23.5* 23.6*   < > 24.1*   < > 27.9* 25.9* 27.7* 28.0* 27.7* 31.0*  MCV 98.3 98.3   < > 99.6  --  95.9 93.8 97.2  --  96.9  --   PLT 219 202   < > 211  --  161 186 189  --  199  --    < > = values in this interval not displayed.   Cardiac Enzymes: No results for input(s): CKTOTAL, CKMB, CKMBINDEX, TROPONINI in  the last 168 hours. CBG: Recent Labs  Lab 06/26/19 2247 06/27/19 0445 06/27/19 0755 06/27/19 1121 06/27/19 2214  GLUCAP 73 67* 166* 86 108*    Iron Studies: No results for input(s): IRON, TIBC, TRANSFERRIN, FERRITIN in the last 72 hours. Studies/Results: Ir Fluoro Guide Cv Line Right  Result Date: 06/28/2019 CLINICAL DATA:  Renal failure, needs tunneled pheresis catheter, with possible future use for hemodialysis. EXAM: TUNNELED PHERESIS/HEMODIALYSIS CATHETER PLACEMENT WITH ULTRASOUND AND FLUOROSCOPIC GUIDANCE TECHNIQUE: The procedure, risks, benefits, and alternatives were explained to the patient. Questions regarding the procedure were encouraged and answered. The patient understands and consents to the procedure. As antibiotic prophylaxis, vancomycin 1 g was ordered pre-procedure and administered intravenously within one hour of incision.Patency of the right IJ vein was confirmed with ultrasound with image documentation. An appropriate skin site was determined. Region was prepped using maximum barrier technique including cap and mask, sterile gown, sterile gloves, large sterile sheet, and Chlorhexidine as cutaneous antisepsis. The region was infiltrated locally with 1% lidocaine. Intravenous Fentanyl 60mg and Versed 139mwere administered as conscious sedation during continuous monitoring of the patient's level of consciousness and physiological / cardiorespiratory status by the radiology RN, with a total moderate sedation time of 10 minutes. Under real-time ultrasound guidance, the right IJ vein was accessed with a 21 gauge micropuncture needle; the needle tip within the vein was confirmed with ultrasound image documentation. Needle exchanged over the 018 guidewire for transitional dilator, which allowed advancement of a Benson wire into the IVC. Over this, an MPA catheter was advanced. A Palindrome 23 hemodialysis catheter was tunneled from the right anterior chest wall approach to the right IJ  dermatotomy site. The MPA catheter was exchanged over an Amplatz wire for serial vascular dilators which allow placement of a peel-away sheath, through which the catheter was advanced under intermittent fluoroscopy, positioned with its tips in the proximal and midright atrium. Spot chest radiograph confirms good catheter position. No pneumothorax. Catheter was flushed and primed per protocol. Catheter secured externally with O Prolene sutures. The right IJ dermatotomy site was closed with Dermabond. COMPLICATIONS: COMPLICATIONS None immediate FLUOROSCOPY TIME:  0.9 minute; 118  uGym2 DAP COMPARISON:  None IMPRESSION: 1. Technically successful placement of tunneled right IJ hemodialysis/pheresis catheter with ultrasound and fluoroscopic guidance. Ready for routine use. ACCESS: Remains approachable for percutaneous intervention as  needed. Electronically Signed   By: Lucrezia Europe M.D.   On: 06/28/2019 17:06   Ir US Guide Vasc Access Right  Result Date: 06/28/2019 CLINICAL DATA:  Renal failure, needs tunneled pheresis catheter, with possible future use for hemodialysis. EXAM: TUNNELED PHERESIS/HEMODIALYSIS CATHETER PLACEMENT WITH ULTRASOUND AND FLUOROSCOPIC GUIDANCE TECHNIQUE: The procedure, risks, benefits, and alternatives were explained to the patient. Questions regarding the procedure were encouraged and answered. The patient understands and consents to the procedure. As antibiotic prophylaxis, vancomycin 1 g was ordered pre-procedure and administered intravenously within one hour of incision.Patency of the right IJ vein was confirmed with ultrasound with image documentation. An appropriate skin site was determined. Region was prepped using maximum barrier technique including cap and mask, sterile gown, sterile gloves, large sterile sheet, and Chlorhexidine as cutaneous antisepsis. The region was infiltrated locally with 1% lidocaine. Intravenous Fentanyl 43mg and Versed 190mwere administered as conscious  sedation during continuous monitoring of the patient's level of consciousness and physiological / cardiorespiratory status by the radiology RN, with a total moderate sedation time of 10 minutes. Under real-time ultrasound guidance, the right IJ vein was accessed with a 21 gauge micropuncture needle; the needle tip within the vein was confirmed with ultrasound image documentation. Needle exchanged over the 018 guidewire for transitional dilator, which allowed advancement of a Benson wire into the IVC. Over this, an MPA catheter was advanced. A Palindrome 23 hemodialysis catheter was tunneled from the right anterior chest wall approach to the right IJ dermatotomy site. The MPA catheter was exchanged over an Amplatz wire for serial vascular dilators which allow placement of a peel-away sheath, through which the catheter was advanced under intermittent fluoroscopy, positioned with its tips in the proximal and midright atrium. Spot chest radiograph confirms good catheter position. No pneumothorax. Catheter was flushed and primed per protocol. Catheter secured externally with O Prolene sutures. The right IJ dermatotomy site was closed with Dermabond. COMPLICATIONS: COMPLICATIONS None immediate FLUOROSCOPY TIME:  0.9 minute; 118  uGym2 DAP COMPARISON:  None IMPRESSION: 1. Technically successful placement of tunneled right IJ hemodialysis/pheresis catheter with ultrasound and fluoroscopic guidance. Ready for routine use. ACCESS: Remains approachable for percutaneous intervention as needed. Electronically Signed   By: D Lucrezia Europe.D.   On: 06/28/2019 17:06   Medications: Infusions: . calcium gluconate IVPB 2 g (06/30/19 1033)  . citrate dextrose    . citrate dextrose      Scheduled Medications: . calcium carbonate  2 tablet Oral Q3H  . Chlorhexidine Gluconate Cloth  6 each Topical Daily  . feeding supplement (ENSURE ENLIVE)  237 mL Oral BID BM  . finasteride  5 mg Oral Daily  . heparin  1,000 Units  Intracatheter Once  . heparin injection (subcutaneous)  5,000 Units Subcutaneous Q8H  . prochlorperazine  10 mg Oral Once    have reviewed scheduled and prn medications.  Physical Exam: General: awake, alert, pleasant gentleman sitting up in bed in NAD Heart: RRR Lungs: CTAB Abdomen: soft, non-distended, non-tender  Extremities: trace BLE edema    06/30/2019,11:31 AM  LOS: 3 days

## 2019-06-30 NOTE — Progress Notes (Addendum)
HEMATOLOGY-ONCOLOGY PROGRESS NOTE  SUBJECTIVE: Received second session of plasmapheresis earlier today. Tolerated well. Denies mouth sores, chest pain, shortness of breath, nausea, and vomiting. Denies LE edema and neuropathy. Eating well. Ambulating in the hallway. Feeling stronger. Received first dose of Velcade yesterday and tolerated this well overall.  Nephrology continues to follow without any recommendations for dialysis.  Scheduled for vein mapping later today.   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 Initial Diagnosis   Multiple myeloma ([place])   07/03/2018 -  Chemotherapy   The patient had palonosetron (ALOXI) injection 0.25 mg, 0.25 mg, Intravenous,  Once, 2 of 2 cycles Administration: 0.25 mg (07/03/2018), 0.25 mg (07/17/2018), 0.25 mg (07/24/2018), 0.25 mg (08/07/2018), 0.25 mg (09/18/2018) bortezomib SQ (VELCADE) chemo injection 3.25 mg, 1.5 mg/m2 = 3.25 mg, Subcutaneous,  Once, 3 of 6 cycles Dose modification: 1.5 mg/m2 (original dose 1.5 mg/m2, Cycle 3, Reason: Provider Judgment) Administration: 3.25 mg (07/03/2018), 3.25 mg (07/17/2018), 3.25 mg (07/24/2018), 3.25 mg (08/07/2018), 3.25 mg (09/18/2018) cyclophosphamide (CYTOXAN) 660 mg in sodium chloride 0.9 % 250 mL chemo infusion, 300 mg/m2 = 660 mg, Intravenous,  Once, 3 of 6 cycles Administration: 660 mg (07/03/2018), 660 mg (07/17/2018), 660 mg (07/24/2018), 660 mg (08/07/2018), 660 mg (09/18/2018)  for chemotherapy treatment.       REVIEW OF SYSTEMS:   As noted in the HPI.  I have reviewed the past medical history, past surgical history, social history and family history with the patient and they are unchanged from previous note.   PHYSICAL EXAMINATION: ECOG PERFORMANCE STATUS: 2 - Symptomatic, <50% confined to bed  Vitals:   06/30/19 1122 06/30/19 1206  BP: (!) 156/87 (!) 145/92  Pulse: 65 72  Resp: 18 18  Temp: 97.6 F (36.4 C) (!) 97.5 F (36.4 C)  SpO2: 100% 100%   Filed Weights   06/26/19 0433  06/30/19 0005  Weight: 206 lb 5.6 oz (93.6 kg) 209 lb 10.5 oz (95.1 kg)    Intake/Output from previous day: 10/27 0701 - 10/28 0700 In: 360 [P.O.:360] Out: 400 [Urine:400]  GENERAL:alert, no distress and comfortable SKIN: skin color, texture, turgor are normal, no rashes or significant lesions EYES: normal, Conjunctiva are pink and non-injected, sclera clear OROPHARYNX:no exudate, no erythema and lips, buccal mucosa, and tongue normal  NECK: supple, thyroid normal size, non-tender, without nodularity LYMPH:  no palpable lymphadenopathy in the cervical, axillary or inguinal LUNGS: clear to auscultation and percussion with normal breathing effort HEART: regular rate & rhythm and no murmurs and trace pedal edema ABDOMEN:abdomen soft, non-tender and normal bowel sounds Musculoskeletal:no cyanosis of digits and no clubbing  NEURO: alert & oriented x 3 with fluent speech, no focal motor/sensory deficits  LABORATORY DATA:  I have reviewed the data as listed CMP Latest Ref Rng & Units 06/29/2019 06/29/2019  Glucose 70 - 99 mg/dL 87 88  BUN 8 - 23 mg/dL 77(H) 71(H)  Creatinine 0.61 - 1.24 mg/dL 5.40(H) 5.57(H)  Sodium 135 - 145 mmol/L 137 139  Potassium 3.5 - 5.1 mmol/L 3.7 3.7  Chloride 98 - 111 mmol/L 101 103  CO2 22 - 32 mmol/L - 23  Calcium 8.9 - 10.3 mg/dL - 9.3  Total Protein 6.5 - 8.1 g/dL - -  Total Bilirubin 0.3 - 1.2 mg/dL - -  Alkaline Phos 38 - 126 U/L - -  AST 15 - 41 U/L - -  ALT 0 - 44 U/L - -   . CBC Latest Ref Rng & Units 06/29/2019 06/29/2019  WBC 4.0 - 10.5 K/uL 5.2 -  Hemoglobin 13.0 - 17.0 g/dL 9.3(L) 9.5(L)  Hematocrit 39.0 - 52.0 % 27.7(L) 28.0(L)  Platelets 150 - 400 K/uL 199 -    Ct Abdomen Pelvis Wo Contrast  Result Date: 06/01/2019 CLINICAL DATA:  Status post partial right colectomy 05/21/2018. Incisional hernia evaluation. Additional history of prostate cancer. EXAM: CT ABDOMEN AND PELVIS WITHOUT CONTRAST TECHNIQUE: Multidetector CT imaging of the  abdomen and pelvis was performed following the standard protocol without IV contrast. Creatinine was obtained on site at [facility] at [address] Results: Creatinine 4.1 mg/dL. COMPARISON:  08/19/2018 PET-CT.  06/05/2018 CT abdomen/pelvis. FINDINGS: Lower chest: No significant pulmonary nodules or acute consolidative airspace disease. Cardiomegaly. Coronary atherosclerosis. Hepatobiliary: Normal liver size. Simple 1.0 cm right liver dome cyst. Several subcentimeter hypodense liver lesions scattered throughout the liver, too small to characterize, not appreciably changed since 06/05/2018 CT, probably benign. No appreciable new liver lesions. Normal gallbladder with no radiopaque cholelithiasis. No biliary ductal dilatation. Pancreas: Normal, with no mass or duct dilation. Spleen: Normal size. No mass. Adrenals/Urinary Tract: Normal adrenals. No renal stones. No hydronephrosis. Dominant 7.2 x 6.2 cm posterior upper left renal cortical mass with density 29 HU (series 2/image 23), increased from 4.6 x 4.8 cm on 06/05/2018 CT. Mixed attenuation 5.1 x 4.2 cm renal cortical mass in the medial lower right kidney (series 2/image 37), previously 5.0 x 4.0 cm on 06/05/2018 CT, not significantly changed. Numerous additional hyperdense renal cortical masses in both kidneys, largest 3.5 cm in the interpolar right kidney (series 2/image 30), increased from 2.8 cm. Several simple renal cysts scattered in both kidneys, largest 4.2 cm in medial interpolar left kidney. Chronic mild diffuse bladder wall thickening is unchanged. No significant bladder distention. No bladder stones. Stomach/Bowel: Normal non-distended stomach. Status post subtotal right hemicolectomy with ileocolic anastomosis in the right abdomen. No small bowel dilatation or wall thickening. Oral contrast transits to the rectum. Mild scattered diverticulosis throughout the remnant large-bowel, with no definite large bowel wall thickening or acute  pericolonic fat stranding. There is prominent diastasis of the midline supraumbilical ventral abdominal wall (series 2/image 27) containing a portion of the transverse colon. There is a tiny umbilical hernia containing a portion of a small bowel loop. Vascular/Lymphatic: Atherosclerotic abdominal aorta with ectatic 2.8 cm infrarenal abdominal aorta. 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 No pathologically enlarged lymph nodes in the abdomen or pelvis. Reproductive: Markedly enlarged prostate with mass-effect on the bladder base with nonspecific internal prostatic calcifications, not appreciably changed. Other: No pneumoperitoneum, ascites or focal fluid collection. Musculoskeletal: No aggressive appearing focal osseous lesions. Marked thoracolumbar spondylosis. IMPRESSION: 1. Significant interval growth of 7.2 cm posterior upper left renal cortical mass, which was hypermetabolic on 40/98/1191 PET-CT, and which was enhancing on 03/24/2018 CT abdomen/pelvis. 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 Urology consultation advised. 2. Numerous additional complex hyperdense bilateral renal masses are incompletely evaluated on this noncontrast study. Additional renal cell carcinomas cannot be excluded. MRI abdomen without and with IV contrast is indicated for further evaluation. 3. Prominent diastasis of the midline supraumbilical ventral abdominal wall, containing a portion of the transverse colon. 4. Separate tiny umbilical hernia contains a portion of a small bowel loop. No acute bowel complication. 5. Cardiomegaly.  Coronary atherosclerosis. 6. Chronic mild diffuse bladder wall thickening, probably due to chronic bladder outlet obstruction by the markedly enlarged prostate. No hydronephrosis. 7. Ectatic 2.8 cm infrarenal abdominal aorta,  at risk for aneurysm development. Recommend follow-up aortic ultrasound in 5 years. This recommendation follows  ACR consensus guidelines: White Paper of the ACR Incidental Findings Committee II on Vascular Findings. J Am Coll Radiol 2013; 10:789-794. 8.  Aortic Atherosclerosis (ICD10-I70.0). These results will be called to the ordering clinician or representative by the Radiologist Assistant, and communication documented in the PACS or zVision Dashboard. Patient's acute renal failure as detected by creatinine testing prior to the scan was called by the CT technologist to triage RN Abigail Butts in the provider's office. Electronically Signed   By: Ilona Sorrel M.D.   On: 06/01/2019 14:00   US Renal  Result Date: 06/26/2019 CLINICAL DATA:  Acute kidney injury. EXAM: RENAL / URINARY TRACT ULTRASOUND COMPLETE COMPARISON:  06/04/2019 FINDINGS: Right Kidney: Renal measurements: 10.9 x 4.9 x 6.1 cm. = volume: 170 mL. Increased cortical echogenicity. No hydronephrosis. Multiple right kidney cysts. The dominant cyst is complex with solid and cystic components arising from the lower pole measuring 3.8 x 4.0 x 4.3 cm. Left Kidney: Renal measurements: 12.0 x 6.3 x 6.4 cm. = volume: 249.3 mL. Increased echogenicity. No hydronephrosis. Multiple lesions within the left kidney are again identified. The dominant lesion appears solid and arises from the upper pole of the left kidney measuring 7.6 x 5.5 x 6.5 cm. Other lesions have imaging features of simple cysts. Bladder: There is marked enlargement of the prostate gland which has mass effect upon the bladder. Other: None IMPRESSION: Bilateral increased renal cortical echogenicity compatible with chronic medical renal disease. No hydronephrosis. Multiple bilateral simple appearing and complex kidney lesions. Solid and cystic lesion within the inferior pole of the right kidney is noted measuring 4.3 cm. Cystic renal cell carcinoma cannot be excluded. A solid-appearing lesion arises from the upper pole of the left kidney measuring 7.6 cm. As mentioned on previous imaging findings are concerning  for renal cell carcinoma. Prostate gland enlargement. Electronically Signed   By: Kerby Moors M.D.   On: 06/26/2019 04:38   US Renal  Result Date: 06/04/2019 CLINICAL DATA:  Acute kidney injury. EXAM: RENAL / URINARY TRACT ULTRASOUND COMPLETE COMPARISON:  Abdominopelvic CT 06/01/2019. Renal ultrasound 06/06/2018. PET-CT 08/19/2018. FINDINGS: Right Kidney: Renal measurements: 13.0 x 5.6 x 4.9 cm = volume: 185.1 mL. There is a simple appearing cystic lesion in the upper interpolar region measuring 3.0 x 2.6 x 2.7 cm. In the lower pole, there is a mildly septated cystic lesion measuring 4.2 x 3.3 x 3.7 cm. Numerous other smaller cystic lesions are present. No hydronephrosis. Left Kidney: Renal measurements: 10.5 x 6.2 x 4.4 cm = volume: 151 mL. The known solid mass in the upper pole which was hypermetabolic on previous PET-CT shows continued enlargement as seen on earlier CT. This now measures 7.3 x 6.0 x 8.2 cm (4.9 x 4.5 x 4.5 cm on previous ultrasound of 1 year ago). Color Doppler analysis of this lesion is suboptimal. There are additional cystic lesions in the interpolar region, measuring up to 4.2 and 3.5 cm respectively. No hydronephrosis. Bladder: Median lobe prostate hypertrophy protrudes into the bladder lumen as seen on previous studies. There is a separate filling defect which was incompletely evaluated by this examination, probably reflecting blood clot. Patient was unable to complete the examination, needing to void. IMPRESSION: 1. Known solid mass involving the upper pole of the left kidney shows continued enlargement as seen on earlier CT scan. This remains consistent with neoplasm, likely renal cell carcinoma. Please refer to recommendations on CT scan  performed 06/01/2019. 2. Additional simple and complex cystic lesions bilaterally. No hydronephrosis. 3. Filling defect in the urinary bladder lumen adjacent to an enlarged median lobe, probably blood clot. This was incompletely evaluated by this  examination. Electronically Signed   By: Richardean Sale M.D.   On: 06/04/2019 11:38   Ir Fluoro Guide Cv Line Right  Result Date: 06/28/2019 CLINICAL DATA:  Renal failure, needs tunneled pheresis catheter, with possible future use for hemodialysis. EXAM: TUNNELED PHERESIS/HEMODIALYSIS CATHETER PLACEMENT WITH ULTRASOUND AND FLUOROSCOPIC GUIDANCE TECHNIQUE: The procedure, risks, benefits, and alternatives were explained to the patient. Questions regarding the procedure were encouraged and answered. The patient understands and consents to the procedure. As antibiotic prophylaxis, vancomycin 1 g was ordered pre-procedure and administered intravenously within one hour of incision.Patency of the right IJ vein was confirmed with ultrasound with image documentation. An appropriate skin site was determined. Region was prepped using maximum barrier technique including cap and mask, sterile gown, sterile gloves, large sterile sheet, and Chlorhexidine as cutaneous antisepsis. The region was infiltrated locally with 1% lidocaine. Intravenous Fentanyl 61mg and Versed 135mwere administered as conscious sedation during continuous monitoring of the patient's level of consciousness and physiological / cardiorespiratory status by the radiology RN, with a total moderate sedation time of 10 minutes. Under real-time ultrasound guidance, the right IJ vein was accessed with a 21 gauge micropuncture needle; the needle tip within the vein was confirmed with ultrasound image documentation. Needle exchanged over the 018 guidewire for transitional dilator, which allowed advancement of a Benson wire into the IVC. Over this, an MPA catheter was advanced. A Palindrome 23 hemodialysis catheter was tunneled from the right anterior chest wall approach to the right IJ dermatotomy site. The MPA catheter was exchanged over an Amplatz wire for serial vascular dilators which allow placement of a peel-away sheath, through which the catheter was  advanced under intermittent fluoroscopy, positioned with its tips in the proximal and midright atrium. Spot chest radiograph confirms good catheter position. No pneumothorax. Catheter was flushed and primed per protocol. Catheter secured externally with O Prolene sutures. The right IJ dermatotomy site was closed with Dermabond. COMPLICATIONS: COMPLICATIONS None immediate FLUOROSCOPY TIME:  0.9 minute; 118  uGym2 DAP COMPARISON:  None IMPRESSION: 1. Technically successful placement of tunneled right IJ hemodialysis/pheresis catheter with ultrasound and fluoroscopic guidance. Ready for routine use. ACCESS: Remains approachable for percutaneous intervention as needed. Electronically Signed   By: D Lucrezia Europe.D.   On: 06/28/2019 17:06   Ir UsKoreauide Vasc Access Right  Result Date: 06/28/2019 CLINICAL DATA:  Renal failure, needs tunneled pheresis catheter, with possible future use for hemodialysis. EXAM: TUNNELED PHERESIS/HEMODIALYSIS CATHETER PLACEMENT WITH ULTRASOUND AND FLUOROSCOPIC GUIDANCE TECHNIQUE: The procedure, risks, benefits, and alternatives were explained to the patient. Questions regarding the procedure were encouraged and answered. The patient understands and consents to the procedure. As antibiotic prophylaxis, vancomycin 1 g was ordered pre-procedure and administered intravenously within one hour of incision.Patency of the right IJ vein was confirmed with ultrasound with image documentation. An appropriate skin site was determined. Region was prepped using maximum barrier technique including cap and mask, sterile gown, sterile gloves, large sterile sheet, and Chlorhexidine as cutaneous antisepsis. The region was infiltrated locally with 1% lidocaine. Intravenous Fentanyl 2512mand Versed 1mg65mre administered as conscious sedation during continuous monitoring of the patient's level of consciousness and physiological / cardiorespiratory status by the radiology RN, with a total moderate sedation time  of 10 minutes. Under real-time ultrasound guidance, the right  IJ vein was accessed with a 21 gauge micropuncture needle; the needle tip within the vein was confirmed with ultrasound image documentation. Needle exchanged over the 018 guidewire for transitional dilator, which allowed advancement of a Benson wire into the IVC. Over this, an MPA catheter was advanced. A Palindrome 23 hemodialysis catheter was tunneled from the right anterior chest wall approach to the right IJ dermatotomy site. The MPA catheter was exchanged over an Amplatz wire for serial vascular dilators which allow placement of a peel-away sheath, through which the catheter was advanced under intermittent fluoroscopy, positioned with its tips in the proximal and midright atrium. Spot chest radiograph confirms good catheter position. No pneumothorax. Catheter was flushed and primed per protocol. Catheter secured externally with O Prolene sutures. The right IJ dermatotomy site was closed with Dermabond. COMPLICATIONS: COMPLICATIONS None immediate FLUOROSCOPY TIME:  0.9 minute; 118  uGym2 DAP COMPARISON:  None IMPRESSION: 1. Technically successful placement of tunneled right IJ hemodialysis/pheresis catheter with ultrasound and fluoroscopic guidance. Ready for routine use. ACCESS: Remains approachable for percutaneous intervention as needed. Electronically Signed   By: Lucrezia Europe M.D.   On: 06/28/2019 17:06   Dg Foot 2 Views Left  Result Date: 06/03/2019 CLINICAL DATA:  One week of great toe swelling and pain EXAM: LEFT FOOT - 2 VIEW COMPARISON:  None. FINDINGS: Soft tissue swelling medial to the first MTP joint with soft tissue calcification. Mild hallux valgus. No definite juxta articular or marginal erosion. IMPRESSION: Swelling and soft tissue calcification at the first MTP joint, suspect gout. Electronically Signed   By: Monte Fantasia M.D.   On: 06/03/2019 05:40    ASSESSMENT AND PLAN: 73 y.o. male with  1) Progressive Kappa light  chain Multiple Myeloma -- with likely progression since patient abandoned treatment earlier this year.   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  Some concern with urinary retention.  3) Anemia due to CKD, myeloma , RCC  4) RCC - 7.6 cms in upper pole of rt kidney.  PLAN -Patient did not have any prohibitive toxicities from his first dose of Velcade 1.5 mg/m that he received on 10/27 -If labs stable will add Cytoxan from next week. -Tolerated second session of plasmapheresis well with no immediate concerns.  -We will plan to do 3 sessions of plasmapheresis to reduce light chain burden. -transfuse prn for hgb<8 -High likelihood of renal and fluid overload related decompensation when steroids are added as a part of his treatment. -Will likely need to use a lower dose of steroids. -We will plan to start on acyclovir in the next several days for VZV prophylaxis in the setting of Velcade therapy.  Holding currently to avoid adding additional factors causing renal decompensation. -Will need stabilization of his renal function/decision on dialysis and stability and some improvement in his myeloma function prior to consideration of nephrectomy. -It is likely given current trend of renal function the nephrectomy will likely have a high chance of making him dialysis dependent. -high likelihood of needing HD and increased risk of TLS with myeloma treatment. -Will need close nephrology follow-up inpatient and outpatient. -We will continue to follow as inpatient.  Mikey Bussing, DNP, AGPCNP-BC, AOCNP   ADDENDUM  .Patient was Personally and independently interviewed, examined and relevant elements of the history of present illness were reviewed in details and an assessment and plan was created. All elements of the patient's history of present illness , assessment and plan were discussed in details with Mikey Bussing, DNP,. The above  documentation reflects our combined findings assessment  and plan.  Sullivan Lone MD MS

## 2019-07-01 ENCOUNTER — Inpatient Hospital Stay (HOSPITAL_COMMUNITY): Payer: Medicare Other

## 2019-07-01 ENCOUNTER — Inpatient Hospital Stay: Payer: Medicare Other

## 2019-07-01 DIAGNOSIS — N186 End stage renal disease: Secondary | ICD-10-CM | POA: Diagnosis not present

## 2019-07-01 DIAGNOSIS — N08 Glomerular disorders in diseases classified elsewhere: Secondary | ICD-10-CM | POA: Diagnosis not present

## 2019-07-01 DIAGNOSIS — C9000 Multiple myeloma not having achieved remission: Secondary | ICD-10-CM | POA: Diagnosis not present

## 2019-07-01 DIAGNOSIS — D649 Anemia, unspecified: Secondary | ICD-10-CM | POA: Diagnosis not present

## 2019-07-01 LAB — BASIC METABOLIC PANEL
Anion gap: 11 (ref 5–15)
Anion gap: 12 (ref 5–15)
BUN: 73 mg/dL — ABNORMAL HIGH (ref 8–23)
BUN: 73 mg/dL — ABNORMAL HIGH (ref 8–23)
CO2: 20 mmol/L — ABNORMAL LOW (ref 22–32)
CO2: 19 mmol/L — ABNORMAL LOW (ref 22–32)
Calcium: 8.9 mg/dL (ref 8.9–10.3)
Calcium: 9.1 mg/dL (ref 8.9–10.3)
Chloride: 107 mmol/L (ref 98–111)
Chloride: 108 mmol/L (ref 98–111)
Creatinine, Ser: 5.63 mg/dL — ABNORMAL HIGH (ref 0.61–1.24)
Creatinine, Ser: 5.36 mg/dL — ABNORMAL HIGH (ref 0.61–1.24)
GFR calc Af Amer: 11 mL/min — ABNORMAL LOW (ref >60)
GFR calc Af Amer: 11 mL/min — ABNORMAL LOW (ref >60)
GFR calc non Af Amer: 9 mL/min — ABNORMAL LOW (ref >60)
GFR calc non Af Amer: 10 mL/min — ABNORMAL LOW (ref >60)
Glucose, Bld: 113 mg/dL — ABNORMAL HIGH (ref 70–99)
Glucose, Bld: 123 mg/dL — ABNORMAL HIGH (ref 70–99)
Potassium: 3.9 mmol/L (ref 3.5–5.1)
Potassium: 3.9 mmol/L (ref 3.5–5.1)
Sodium: 138 mmol/L (ref 135–145)
Sodium: 139 mmol/L (ref 135–145)

## 2019-07-01 LAB — POCT I-STAT, CHEM 8
BUN: 77 mg/dL — ABNORMAL HIGH (ref 8–23)
Calcium, Ion: 1.29 mmol/L (ref 1.15–1.40)
Chloride: 104 mmol/L (ref 98–111)
Creatinine, Ser: 5.4 mg/dL — ABNORMAL HIGH (ref 0.61–1.24)
Glucose, Bld: 112 mg/dL — ABNORMAL HIGH (ref 70–99)
HCT: 26 % — ABNORMAL LOW (ref 39.0–52.0)
Hemoglobin: 8.8 g/dL — ABNORMAL LOW (ref 13.0–17.0)
Potassium: 3.7 mmol/L (ref 3.5–5.1)
Sodium: 140 mmol/L (ref 135–145)
TCO2: 21 mmol/L — ABNORMAL LOW (ref 22–32)

## 2019-07-01 LAB — CBC WITH DIFFERENTIAL/PLATELET
Abs Immature Granulocytes: 0.02 10*3/uL (ref 0.00–0.07)
Basophils Absolute: 0 10*3/uL (ref 0.0–0.1)
Basophils Relative: 0 %
Eosinophils Absolute: 0.1 10*3/uL (ref 0.0–0.5)
Eosinophils Relative: 3 %
HCT: 28.6 % — ABNORMAL LOW (ref 39.0–52.0)
Hemoglobin: 9.4 g/dL — ABNORMAL LOW (ref 13.0–17.0)
Immature Granulocytes: 0 %
Lymphocytes Relative: 21 %
Lymphs Abs: 1.2 10*3/uL (ref 0.7–4.0)
MCH: 32.3 pg (ref 26.0–34.0)
MCHC: 32.9 g/dL (ref 30.0–36.0)
MCV: 98.3 fL (ref 80.0–100.0)
Monocytes Absolute: 0.6 10*3/uL (ref 0.1–1.0)
Monocytes Relative: 10 %
Neutro Abs: 3.7 10*3/uL (ref 1.7–7.7)
Neutrophils Relative %: 66 %
Platelets: 180 10*3/uL (ref 150–400)
RBC: 2.91 MIL/uL — ABNORMAL LOW (ref 4.22–5.81)
RDW: 13 % (ref 11.5–15.5)
WBC: 5.7 10*3/uL (ref 4.0–10.5)
nRBC: 0 % (ref 0.0–0.2)

## 2019-07-01 MED ORDER — CALCIUM CARBONATE ANTACID 500 MG PO CHEW
2.0000 | CHEWABLE_TABLET | ORAL | Status: AC
  Administered 2019-07-01: 15:00:00 400 mg via ORAL
  Filled 2019-07-01: qty 2

## 2019-07-01 MED ORDER — ACD FORMULA A 0.73-2.45-2.2 GM/100ML VI SOLN
500.0000 mL | Status: DC
  Administered 2019-07-01: 11:00:00 500 mL via INTRAVENOUS
  Filled 2019-07-01: qty 500

## 2019-07-01 MED ORDER — DIPHENHYDRAMINE HCL 25 MG PO CAPS: 25.0000 mg | ORAL_CAPSULE | Freq: Four times a day (QID) | ORAL | Status: DC | PRN

## 2019-07-01 MED ORDER — AMLODIPINE BESYLATE 10 MG PO TABS
10.0000 mg | ORAL_TABLET | Freq: Every day | ORAL | Status: DC
  Administered 2019-07-01 – 2019-07-02 (×2): 10 mg via ORAL
  Filled 2019-07-01 (×2): qty 1

## 2019-07-01 MED ORDER — ACETAMINOPHEN 325 MG PO TABS: 650.0000 mg | ORAL_TABLET | ORAL | Status: DC | PRN

## 2019-07-01 MED ORDER — HEPARIN SODIUM (PORCINE) 1000 UNIT/ML IJ SOLN
INTRAMUSCULAR | Status: AC
  Administered 2019-07-01: 13:00:00 3800 [IU]
  Filled 2019-07-01: qty 4

## 2019-07-01 MED ORDER — HEPARIN SODIUM (PORCINE) 1000 UNIT/ML IJ SOLN: 1000.0000 [IU] | Freq: Once | INTRAMUSCULAR | Status: DC

## 2019-07-01 MED ORDER — SODIUM CHLORIDE 0.9 % IV SOLN
INTRAVENOUS | Status: AC
  Administered 2019-07-01 (×3): via INTRAVENOUS_CENTRAL
  Filled 2019-07-01 (×4): qty 200

## 2019-07-01 MED ORDER — CALCIUM CARBONATE ANTACID 500 MG PO CHEW
CHEWABLE_TABLET | ORAL | Status: AC
  Administered 2019-07-01: 12:00:00 400 mg
  Filled 2019-07-01: qty 2

## 2019-07-01 MED ORDER — ACD FORMULA A 0.73-2.45-2.2 GM/100ML VI SOLN
Status: AC
  Filled 2019-07-01: qty 500

## 2019-07-01 MED ORDER — SODIUM CHLORIDE 0.9 % IV SOLN
2.0000 g | Freq: Once | INTRAVENOUS | Status: AC
  Administered 2019-07-01: 11:00:00 2 g via INTRAVENOUS
  Filled 2019-07-01: qty 20

## 2019-07-01 NOTE — Plan of Care (Signed)

## 2019-07-01 NOTE — Progress Notes (Signed)
Subjective:  Patient seen and evaluated at bedside. Endorsed some mild nausea after plasma exchange, but otherwise did well. He denies headaches, chest pain, shortness of breath, vomiting, abdominal pain, constipation. Having good urine output.   Objective Vital signs in last 24 hours: Vitals:   06/30/19 1206 06/30/19 1832 07/01/19 0124 07/01/19 0600  BP: (!) 145/92 (!) 148/90 136/83 (!) 159/82  Pulse: 72 61 63 (!) 58  Resp: 18 18 18 18   Temp: (!) 97.5 F (36.4 C) (!) 97.4 F (36.3 C) 98.2 F (36.8 C) 97.7 F (36.5 C)  TempSrc: Oral Oral Oral Oral  SpO2: 100% 100% 98% 99%  Weight:      Height:       Weight change:   Intake/Output Summary (Last 24 hours) at 07/01/2019 1052 Last data filed at 07/01/2019 0931 Gross per 24 hour  Intake 1476.52 ml  Output 600 ml  Net 876.52 ml   Assessment/Plan: 1.Chronic kidney disease stage III. His baseline creatinine back in March 2020 was around 1.6. He has progressively worsened. Suspect his chronic kidney disease is on the basis of underlying myeloma kidney.  2. Acute kidney injury.Based on rapid progression, may be in the setting of myeloma kidney. Not recommending biopsy at this time due to increased risk with renal cell carcinoma. Oncology is following and appreciate their recommendations. He will receive his third and final plasma exchange today to decrease light chain burden which should hopefully improve renal function somewhat, but likelihood of seeing rapid renal improvement is low. He is on weekly bortezomib with plan to add cyclophosphamide if he remains stable. With his poor renal function, he is at high risk of requiring HD. He will need to have ongoing [place] discussions, as patient continues to state he would not want to undergo chemo and HD at the same time. Acutely, he does not have any signs of uremia or other urgent indications for dialysis. Completing vein mapping with plan to get permanent acces prior to discharge.   3.  Multiple myeloma. Suspect significant progression of his disease.Oncology input appreciated. Plan is for plasmapheresis and chemotherapy.   4. Renal cell carcinoma. He looks to have at least 2 potential lesions, with other smaller potential cystic lesions throughout the kidneys.Will need urology follow-up.  5. Anemia. Will avoid ESA's for now.Transfuse as needed for hgb <8   6. Hypertension. Continue outpatient dose of amlodipine.  Delice Bison    Labs: Basic Metabolic Panel: Recent Labs  Lab 06/28/19 0535 06/29/19 0517  06/30/19 1024 06/30/19 1600 07/01/19 0506  NA 138 139   < > 139 139 138  K 3.2* 3.7   < > 3.9 4.3 3.9  CL 101 103   < > 103 109 107  CO2 23 23  --   --  20* 20*  GLUCOSE 85 88   < > 132* 120* 113*  BUN 71* 71*   < > 72* 71* 73*  CREATININE 5.56* 5.57*   < > 5.30* 5.27* 5.63*  CALCIUM 9.2 9.3  --   --  8.7* 8.9  PHOS 5.2* 5.2*  --   --  4.4  --    < > = values in this interval not displayed.   Liver Function Tests: Recent Labs  Lab 06/25/19 1216 06/25/19 1741 06/28/19 0535 06/29/19 0517 06/30/19 1600  AST 25 28  --   --   --   ALT 38 41  --   --   --   ALKPHOS 178* 161*  --   --   --  BILITOT 0.6 0.5  --   --   --   PROT 7.2 6.8  --   --   --   ALBUMIN 3.8 3.7 3.4* 3.2* 4.5   No results for input(s): LIPASE, AMYLASE in the last 168 hours. No results for input(s): AMMONIA in the last 168 hours. CBC: Recent Labs  Lab 06/25/19 1216  06/28/19 0535 06/29/19 0517  06/29/19 1623 06/30/19 1024 06/30/19 1600 07/01/19 0506  WBC 5.2   < > 4.5 5.2  --  5.2  --  6.6 5.7  NEUTROABS 3.8  --   --   --   --  3.5  --   --  3.7  HGB 7.8*   < > 8.8* 9.1*   < > 9.3* 10.5* 10.6* 9.4*  HCT 23.6*   < > 25.9* 27.7*   < > 27.7* 31.0* 33.4* 28.6*  MCV 98.3   < > 93.8 97.2  --  96.9  --  100.3* 98.3  PLT 202   < > 186 189  --  199  --  199 180   < > = values in this interval not displayed.   Cardiac Enzymes: No results for input(s):  CKTOTAL, CKMB, CKMBINDEX, TROPONINI in the last 168 hours. CBG: Recent Labs  Lab 06/26/19 2247 06/27/19 0445 06/27/19 0755 06/27/19 1121 06/27/19 2214  GLUCAP 73 67* 166* 86 108*    Iron Studies: No results for input(s): IRON, TIBC, TRANSFERRIN, FERRITIN in the last 72 hours. Studies/Results: No results found. Medications: Infusions: . therapeutic plasma exchange solution    . calcium gluconate IVPB    . citrate dextrose    . citrate dextrose      Scheduled Medications: . calcium carbonate  2 tablet Oral Q3H  . Chlorhexidine Gluconate Cloth  6 each Topical Daily  . feeding supplement (ENSURE ENLIVE)  237 mL Oral BID BM  . finasteride  5 mg Oral Daily  . heparin  1,000 Units Intracatheter Once  . heparin injection (subcutaneous)  5,000 Units Subcutaneous Q8H  . prochlorperazine  10 mg Oral Once    have reviewed scheduled and prn medications.  Physical Exam: General: awake, alert, sitting up in bed in NAD Heart: RRR Lungs: CTAB Abdomen: soft, non-distended, non-tender  Extremities: trace BLE edema    07/01/2019,10:52 AM  LOS: 4 days

## 2019-07-01 NOTE — Progress Notes (Signed)
Upper extremity vein mapping completed. Refer to "CV Proc" under chart review to view preliminary results.  07/01/2019 11:08 AM Maudry Mayhew, MHA, RVT, RDCS, RDMS

## 2019-07-01 NOTE — Progress Notes (Signed)
PROGRESS NOTE  [name] [name] EXB:[id number] DOB: 08-24-1946 DOA: 06/25/2019 PCP: Scot Jun, FNP  Brief History   [name] [name] is an 73 y.o. male with medical history significant ofBPH, paroxysmal atrial fibrillation, CAD, chronic diastolic congestive heart failure, CKD stage III, colon cancer, light chain myeloma, renal cell carcinoma, COPD, type 2 diabetes, hypertension, hyperlipidemia being sent to the ED by his oncologistDr. Lillard Anes he was noted to have worsening of his renal function with creatinine up to 5.7. He also reported symptoms concerning for urinary retention.Patient states he has had problems emptying his bladder fully and is seen by urology. He feels this problem is now getting worse. It takes multiple attempts for him to fully empty his bladder. States he was seen by urology a week ago as he was noticing blood in his urine. States he is supposed to see nephrology for his kidney problem but does not have an appointment yet.  Now with catheter if HD needed- plan per oncology is to start his treatment and monitor Cr.   Pt is going for 3rd plasmapheresis this morning. He is reluctant to proceed with graft placement. Vein mapping in place.  Consultants   Medical oncology  Nephrology  Procedures   Plasmapheresis  Velcade infusions  Antibiotics   Anti-infectives (From admission, onward)   Start     Dose/Rate Route Frequency Ordered Stop   06/28/19 1601  vancomycin (VANCOCIN) 1-5 GM/200ML-% IVPB    Note to Pharmacy: Manuela Neptune   : cabinet override      06/28/19 1601 06/29/19 0414      Subjective  The patient is resting comfortably. No new complaints.  Objective   Vitals:  Vitals:   07/01/19 1217 07/01/19 1237  BP: (!) 166/83 (!) 144/86  Pulse: 67 (!) 58  Resp: (!) 21 18  Temp:  97.6 F (36.4 C)  SpO2:     Exam:  Constitutional:   The patient is awake, alert, and oriented x 3. No acute distress. Respiratory:   No increased work of  breathing.  No wheezes, rales, or rhonchi  No tactile fremitus Cardiovascular:   Regular rate and rhythm  No murmurs, ectopy, or gallups.  No lateral PMI. No thrills. Abdomen:   Abdomen is soft, non-tender, non-distended  No hernias, masses, or organomegaly  Normoactive bowel sounds.  Musculoskeletal:   No cyanosis or clubbing  1-2+ pitting edema of lower extremities bilaterally. Skin:   No rashes, lesions, ulcers  palpation of skin: no induration or nodules Neurologic:   CN 2-12 intact  Sensation all 4 extremities intact Psychiatric:   Mental status o Mood, affect appropriate o Orientation to person, place, time   judgment and insight appear intact  I have personally reviewed the following:   Today's Data   Vitals, BMP, CBC   Scheduled Meds:  amLODipine  10 mg Oral Daily   calcium carbonate  2 tablet Oral Q3H   Chlorhexidine Gluconate Cloth  6 each Topical Daily   feeding supplement (ENSURE ENLIVE)  237 mL Oral BID BM   finasteride  5 mg Oral Daily   heparin  1,000 Units Intracatheter Once   heparin injection (subcutaneous)  5,000 Units Subcutaneous Q8H   prochlorperazine  10 mg Oral Once   Continuous Infusions:  therapeutic plasma exchange solution     calcium gluconate IVPB 2 g (07/01/19 1126)   citrate dextrose      Principal Problem:   AKI (acute kidney injury) ([place]) Active Problems:   BPH (benign prostatic  hyperplasia)   Diabetes ([place])   Light chain myeloma ([place])   CKD (chronic kidney disease), stage III   Myeloma kidney ([place])   LOS: 4 days   A & P  AKI on CKD 3/progressively worsening renal function with myeloma: Baseline creatinine 1.5-1.6 in March 2020 and has been progressively worsening since then. Renal ultrasound without hydronephrosis. The patient is receiving weekly Velcade now. Cytoxam may be added next week if possible. The patient has undergone plasmapheresis again today. The patient will receive one more  treatment. Nephrology is following closely as this patient is quite likely to require HD. He is also at high risk of Tumor Lysis Syndrome with myeloma treatment. The patient has temporary dialysis catheter placed for treatment should it be needed. He will have vein mapping done prior to discharge. He is currently in agreement to HD in the future, although his compliance and follow up reliability has varied in the past. Nephrology and Oncology consulted. He is reluctant to proceed with graft placement. Vein mapping in place.  Anemia: Symptomatic and due to multiple myeloma and renal failure. The patient has received 1 unit of PRBC's in transfusion. He will be transfused for hemoglobin less than 8.0.  BPH: Noacute urinary retention noted. Ultrasound showing marked enlargement of the prostate gland causing mass-effect upon the bladder. Continue home finasteride and monitor for signs of retention. I/O if needed and place foley if continues to have issues.  Renal mass/concern for renal cell carcinoma: Oncology and urology follow-up. Likely nephrectomy in the future.  Type 2 diabetes: A1c 5.2. Actual DM II seems unlikely as this A1c is on no medication. Monitor.   Hypertension: HydralazinePRN SBP >150.  Obesity: Body mass index is 32.32 kg/m.   I have seen and examined this patient myself. I have spent 30 minutes in his evaluation and care.  DVT prophylaxis:heparin Code Status: Full Code.  Family Communication:  Disposition Plan: suspect will be in hospital for several days getting treatment for myeloma  [name], DO [facility] Direct contact: see [email or web address]  7PM-7AM contact night coverage as above 07/01/2019, 1:09 PM  LOS: 3 days

## 2019-07-02 LAB — BASIC METABOLIC PANEL
Anion gap: 10 (ref 5–15)
BUN: 68 mg/dL — ABNORMAL HIGH (ref 8–23)
CO2: 18 mmol/L — ABNORMAL LOW (ref 22–32)
Calcium: 9.1 mg/dL (ref 8.9–10.3)
Chloride: 113 mmol/L — ABNORMAL HIGH (ref 98–111)
Creatinine, Ser: 5.29 mg/dL — ABNORMAL HIGH (ref 0.61–1.24)
GFR calc Af Amer: 11 mL/min — ABNORMAL LOW (ref >60)
GFR calc non Af Amer: 10 mL/min — ABNORMAL LOW (ref >60)
Glucose, Bld: 88 mg/dL (ref 70–99)
Potassium: 3.9 mmol/L (ref 3.5–5.1)
Sodium: 141 mmol/L (ref 135–145)

## 2019-07-02 LAB — CBC WITH DIFFERENTIAL/PLATELET
Abs Immature Granulocytes: 0.03 10*3/uL (ref 0.00–0.07)
Basophils Absolute: 0 10*3/uL (ref 0.0–0.1)
Basophils Relative: 1 %
Eosinophils Absolute: 0.1 10*3/uL (ref 0.0–0.5)
Eosinophils Relative: 3 %
HCT: 25.7 % — ABNORMAL LOW (ref 39.0–52.0)
Hemoglobin: 8.6 g/dL — ABNORMAL LOW (ref 13.0–17.0)
Immature Granulocytes: 1 %
Lymphocytes Relative: 26 %
Lymphs Abs: 1.4 10*3/uL (ref 0.7–4.0)
MCH: 32.5 pg (ref 26.0–34.0)
MCHC: 33.5 g/dL (ref 30.0–36.0)
MCV: 97 fL (ref 80.0–100.0)
Monocytes Absolute: 0.6 10*3/uL (ref 0.1–1.0)
Monocytes Relative: 11 %
Neutro Abs: 3.1 10*3/uL (ref 1.7–7.7)
Neutrophils Relative %: 58 %
Platelets: 153 10*3/uL (ref 150–400)
RBC: 2.65 MIL/uL — ABNORMAL LOW (ref 4.22–5.81)
RDW: 12.9 % (ref 11.5–15.5)
WBC: 5.2 10*3/uL (ref 4.0–10.5)
nRBC: 0 % (ref 0.0–0.2)

## 2019-07-02 MED ORDER — HEPARIN SOD (PORK) LOCK FLUSH 100 UNIT/ML IV SOLN
500.0000 [IU] | INTRAVENOUS | Status: DC | PRN
  Filled 2019-07-02: qty 5

## 2019-07-02 MED ORDER — ENSURE ENLIVE PO LIQD: 237.0000 mL | Freq: Two times a day (BID) | ORAL | 12 refills | Status: DC

## 2019-07-02 NOTE — Progress Notes (Signed)
[name] ROUNDING NOTE   Subjective:   This very nice 73 year old gentleman past medical history of BPH paroxysmal atrial fibrillation coronary disease chronic diastolic dysfunction colon cancer light chain myeloma renal cell carcinoma COPD type 2 diabetes hypertension hyperlipidemia.  He was referred by his oncologist [name] [name] who noted an increase in his creatinine to 5.7.  His baseline serum creatinine appears about 1.5-1.6 in March 2020.  His renal ultrasound did not reveal any evidence of hydronephrosis.  He is to receive Velcade weekly has undergone 3 treatments of pheresis in order to reduce light chain burden.[name]  He underwent upper extremity vein mapping 07/01/2019.  We have discussed proceeding with dialysis at length with [name].  He has refused each time.  He is willing to follow-up with [name].  Blood pressure 142/88 pulse 60 temperature 97.6 O2 sats 1% room air urine output 1250 cc 07/01/2019  Sodium 141 potassium 3.9 chloride 113 CO2 18 glucose 88 BUN 68 creatinine 5.29 calcium  9.1 WBC 5.2 hemoglobin 8.6 platelets 153  Renal ultrasound showed bilateral increased cortical echogenicity with no hydronephrosis.  Multiple lesions left [name] dominant lesion appears to be solid arise from the upper pole left [name] measuring 7.6 x 5.5 x 6.5 cm.  He also has a lesion affecting his right [name] which is solid and measures 4.3 cm.   Objective:  Vital signs in last 24 hours:  Temp:  [97.6 F (36.4 C)-98 F (36.7 C)] 97.6 F (36.4 C) (10/30 0920) Pulse Rate:  [54-67] 60 (10/30 0920) Resp:  [12-21] 12 (10/30 0920) BP: (136-174)/(83-100) 142/88 (10/30 0920) SpO2:  [97 %-100 %] 100 % (10/30 0920) Weight:  [93.8 kg] 93.8 kg (10/30 0500)  Weight change:  Filed Weights   06/26/19 0433 06/30/19 0005 07/02/19 0500  Weight: 93.6 kg 95.1 kg 93.8 kg    Intake/Output: I/O last 3 completed shifts: In: 480 [P.O.:480] Out: 2100 [Urine:2100]    Intake/Output this shift:  Total I/O In: 120 [P.O.:120] Out: 200 [Urine:200]  General: awake, alert, sitting up in bed in NAD Heart: RRR Lungs: CTAB Abdomen: soft, non-distended, non-tender  Extremities: trace BLE edema    Basic Metabolic Panel: Recent Labs  Lab 06/28/19 0535 06/29/19 0517  06/30/19 1600 07/01/19 0506 07/01/19 1016 07/01/19 1137 07/02/19 0553  NA 138 139   < > 139 138 139 140 141  K 3.2* 3.7   < > 4.3 3.9 3.9 3.7 3.9  CL 101 103   < > 109 107 108 104 113*  CO2 23 23  --  20* 20* 19*  --  18*  GLUCOSE 85 88   < > 120* 113* 123* 112* 88  BUN 71* 71*   < > 71* 73* 73* 77* 68*  CREATININE 5.56* 5.57*   < > 5.27* 5.63* 5.36* 5.40* 5.29*  CALCIUM 9.2 9.3  --  8.7* 8.9 9.1  --  9.1  PHOS 5.2* 5.2*  --  4.4  --   --   --   --    < > = values in this interval not displayed.    Liver Function Tests: Recent Labs  Lab 06/25/19 1216 06/25/19 1741 06/28/19 0535 06/29/19 0517 06/30/19 1600  AST 25 28  --   --   --   ALT 38 41  --   --   --   ALKPHOS 178* 161*  --   --   --   BILITOT 0.6 0.5  --   --   --  PROT 7.2 6.8  --   --   --   ALBUMIN 3.8 3.7 3.4* 3.2* 4.5   No results for input(s): LIPASE, AMYLASE in the last 168 hours. No results for input(s): AMMONIA in the last 168 hours.  CBC: Recent Labs  Lab 06/25/19 1216  06/29/19 0517  06/29/19 1623 06/30/19 1024 06/30/19 1600 07/01/19 0506 07/01/19 1137 07/02/19 0553  WBC 5.2   < > 5.2  --  5.2  --  6.6 5.7  --  5.2  NEUTROABS 3.8  --   --   --  3.5  --   --  3.7  --  3.1  HGB 7.8*   < > 9.1*   < > 9.3* 10.5* 10.6* 9.4* 8.8* 8.6*  HCT 23.6*   < > 27.7*   < > 27.7* 31.0* 33.4* 28.6* 26.0* 25.7*  MCV 98.3   < > 97.2  --  96.9  --  100.3* 98.3  --  97.0  PLT 202   < > 189  --  199  --  199 180  --  153   < > = values in this interval not displayed.    Cardiac Enzymes: No results for input(s): CKTOTAL, CKMB, CKMBINDEX, TROPONINI in the last 168 hours.  BNP: Invalid input(s):  POCBNP  CBG: Recent Labs  Lab 06/26/19 2247 06/27/19 0445 06/27/19 0755 06/27/19 1121 06/27/19 2214  GLUCAP 73 67* 166* 86 108*    Microbiology: Results for orders placed or performed during the hospital encounter of 06/25/19  SARS CORONAVIRUS 2 (TAT 6-24 HRS) Nasopharyngeal Nasopharyngeal Swab     Status: None   Collection Time: 06/26/19  2:48 AM   Specimen: Nasopharyngeal Swab  Result Value Ref Range Status   SARS Coronavirus 2 NEGATIVE NEGATIVE Final    Comment: (NOTE) SARS-CoV-2 target nucleic acids are NOT DETECTED. The SARS-CoV-2 RNA is generally detectable in upper and lower respiratory specimens during the acute phase of infection. Negative results do not preclude SARS-CoV-2 infection, do not rule out co-infections with other pathogens, and should not be used as the sole basis for treatment or other patient management decisions. Negative results must be combined with clinical observations, patient history, and epidemiological information. The expected result is Negative. Fact Sheet for Patients: SugarRoll.be Fact Sheet for Healthcare Providers: https://www.woods-mathews.com/ This test is not yet approved or cleared by the [place] FDA and  has been authorized for detection and/or diagnosis of SARS-CoV-2 by FDA under an Emergency Use Authorization (EUA). This EUA will remain  in effect (meaning this test can be used) for the duration of the COVID-19 declaration under Section 56 4(b)(1) of the Act, 21 U.S.C. section 360bbb-3(b)(1), unless the authorization is terminated or revoked sooner. Performed at [facility], [place] [address], [place], [place] [address]     Coagulation Studies: No results for input(s): LABPROT, INR in the last 72 hours.  Urinalysis: No results for input(s): COLORURINE, LABSPEC, PHURINE, GLUCOSEU, HGBUR, BILIRUBINUR, KETONESUR, PROTEINUR, UROBILINOGEN, NITRITE, LEUKOCYTESUR in the last 72  hours.  Invalid input(s): APPERANCEUR    Imaging: Vas [place] Upper Ext Vein Mapping (pre-op Avf)  Result Date: 07/01/2019 UPPER EXTREMITY VEIN MAPPING  Indications: Pre-access. Comparison Study: No prior study Performing Technologist: Maudry Mayhew MHA, RDMS, RVT, RDCS  Examination Guidelines: A complete evaluation includes B-mode imaging, spectral Doppler, color Doppler, and power Doppler as needed of all accessible portions of each vessel. Bilateral testing is considered an integral part of a complete examination. Limited examinations for reoccurring  indications may be performed as noted. +-----------------+-------------+----------+--------------+ Right Cephalic   Diameter (cm)Depth (cm)   Findings    +-----------------+-------------+----------+--------------+ Shoulder             0.17        1.35                  +-----------------+-------------+----------+--------------+ Prox upper arm       0.19        0.56                  +-----------------+-------------+----------+--------------+ Mid upper arm        0.13        0.23                  +-----------------+-------------+----------+--------------+ Dist upper arm       0.10        0.28                  +-----------------+-------------+----------+--------------+ Antecubital fossa    0.28        0.33                  +-----------------+-------------+----------+--------------+ Prox forearm         0.16        0.45                  +-----------------+-------------+----------+--------------+ Mid forearm          0.12        0.43                  +-----------------+-------------+----------+--------------+ Dist forearm         0.13        0.36                  +-----------------+-------------+----------+--------------+ Wrist                                   not visualized +-----------------+-------------+----------+--------------+ +-----------------+-------------+----------+--------------+ Right Basilic     Diameter (cm)Depth (cm)   Findings    +-----------------+-------------+----------+--------------+ Prox upper arm                          not visualized +-----------------+-------------+----------+--------------+ Mid upper arm                           not visualized +-----------------+-------------+----------+--------------+ Dist upper arm       0.38                              +-----------------+-------------+----------+--------------+ Antecubital fossa    0.24                 branching    +-----------------+-------------+----------+--------------+ Prox forearm                            not visualized +-----------------+-------------+----------+--------------+ Mid forearm                             not visualized +-----------------+-------------+----------+--------------+ Distal forearm                          not visualized +-----------------+-------------+----------+--------------+ Wrist  not visualized +-----------------+-------------+----------+--------------+ +-----------------+-------------+----------+--------------+ Left Cephalic    Diameter (cm)Depth (cm)   Findings    +-----------------+-------------+----------+--------------+ Shoulder             0.08        0.25                  +-----------------+-------------+----------+--------------+ Prox upper arm       0.08        0.22                  +-----------------+-------------+----------+--------------+ Mid upper arm        0.10        0.26                  +-----------------+-------------+----------+--------------+ Dist upper arm       0.11        0.22                  +-----------------+-------------+----------+--------------+ Antecubital fossa    0.21        0.22                  +-----------------+-------------+----------+--------------+ Prox forearm         0.15        0.31                   +-----------------+-------------+----------+--------------+ Mid forearm          0.09        0.18                  +-----------------+-------------+----------+--------------+ Dist forearm                            not visualized +-----------------+-------------+----------+--------------+ Wrist                                   not visualized +-----------------+-------------+----------+--------------+ +-----------------+-------------+----------+--------------+ Left Basilic     Diameter (cm)Depth (cm)   Findings    +-----------------+-------------+----------+--------------+ Prox upper arm       0.46                              +-----------------+-------------+----------+--------------+ Mid upper arm        0.27                              +-----------------+-------------+----------+--------------+ Dist upper arm       0.32                              +-----------------+-------------+----------+--------------+ Antecubital fossa    0.27                              +-----------------+-------------+----------+--------------+ Prox forearm         0.16                              +-----------------+-------------+----------+--------------+ Mid forearm                             not visualized +-----------------+-------------+----------+--------------+ Distal forearm  not visualized +-----------------+-------------+----------+--------------+ Wrist                                   not visualized +-----------------+-------------+----------+--------------+ *See table(s) above for measurements and observations.  Diagnosing physician: Servando Snare MD Electronically signed by Servando Snare MD on 07/01/2019 at 4:27:38 PM.    Final      Medications:   . citrate dextrose     . amLODipine  10 mg Oral Daily  . Chlorhexidine Gluconate Cloth  6 each Topical Daily  . feeding supplement (ENSURE ENLIVE)  237 mL Oral BID BM  . finasteride  5  mg Oral Daily  . heparin  1,000 Units Intracatheter Once  . heparin injection (subcutaneous)  5,000 Units Subcutaneous Q8H  . prochlorperazine  10 mg Oral Once   acetaminophen **OR** acetaminophen, diphenhydrAMINE, hydrALAZINE  Assessment/ Plan:   Acute on chronic renal insufficiency thought to be secondary to some light chain deposition.  Appreciate assistance from oncology [name] [name].  It does look like he has received 3 plasmapheresis treatments and receiving Velcade.  There is a question of whether to add Cytoxan.  He has been refusing dialysis.  He does not wish to proceed with this as his wife Stanton [name] was on dialysis.  He is agreeable to vein mapping and I encouraged him to have an AV fistula placed as soon as possible.  [facility] will [facility] at [facility] and I will schedule him with myself.  Hypertension/volume appears to be adequately controlled using Norvasc 10 mg daily  Multiple myeloma followed by oncology appreciate assistance  Renal cell carcinoma right and left [name] this is going to be problematic he will need referral to urology.  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  History of BPH no evidence of obstruction continues on finasteride  Diastolic heart failure stable  Anemia avoid ESA's transfuse for hemoglobin less than 8 follow-up with hematology  Paroxysmal atrial fibrillation does not appear to be on anticoagulation at this time.   LOS: [place] @TODAY @11 :07 AM

## 2019-07-02 NOTE — Progress Notes (Signed)
HEMATOLOGY-ONCOLOGY PROGRESS NOTE  SUBJECTIVE:   Patient was seen in the afternoon on 07/01/2019 in the dialysis unit getting his third planned cycle of plasmapheresis.  He has tolerated them well thus far and notes no acute new concerns.  Notes that he feels energetic and is itching to go home. We discussed that his kidney function remains very borderline and he is very likely to require hemodialysis support to be able to tolerate his myeloma treatments.  He continues to vacillate and be noncommittal about whether he would be okay with hemodialysis.  He also understands that if his renal cell carcinoma was to be treated that would also further compromise his renal function and land him on hemodialysis.  We spent a significant period of time defining goals of care.  He does not seem to have the past insight regarding his concerning prognosis and tends to keep changing his mind on treatments he would be okay with. At this time he suggests that he would be okay with following up for outpatient myeloma treatment and maintaining close follow-up with nephrology. He notes that there is also a plan to get an AV fistula placed. No chest pain no shortness of breath no tingling or numbness no evidence of overt bleeding.   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[place])  12/17/2015 Initial Diagnosis   Multiple myeloma ([place])   07/03/2018 -  Chemotherapy   The patient had palonosetron (ALOXI) injection 0.25 mg, 0.25 mg, Intravenous,  Once, 2 of 2 cycles Administration: 0.25 mg (07/03/2018), 0.25 mg (07/17/2018), 0.25 mg (07/24/2018), 0.25 mg (08/07/2018), 0.25 mg (09/18/2018) bortezomib SQ (VELCADE) chemo injection 3.25 mg, 1.5 mg/m2 = 3.25 mg, Subcutaneous,  Once, 3 of 6 cycles Dose modification: 1.5 mg/m2 (original dose 1.5 mg/m2, Cycle 3, Reason: Provider Judgment) Administration: 3.25 mg (07/03/2018), 3.25 mg (07/17/2018), 3.25 mg (07/24/2018), 3.25 mg (08/07/2018), 3.25 mg (09/18/2018), 3.25 mg  (06/29/2019) cyclophosphamide (CYTOXAN) 660 mg in sodium chloride 0.9 % 250 mL chemo infusion, 300 mg/m2 = 660 mg, Intravenous,  Once, 3 of 6 cycles Administration: 660 mg (07/03/2018), 660 mg (07/17/2018), 660 mg (07/24/2018), 660 mg (08/07/2018), 660 mg (09/18/2018)  for chemotherapy treatment.       REVIEW OF SYSTEMS:   As noted in the HPI.  I have reviewed the past medical history, past surgical history, social history and family history with the patient and they are unchanged from previous note.   PHYSICAL EXAMINATION .BP (!) 142/88 (BP Location: Left Arm)    Pulse 60    Temp 97.6 F (36.4 C) (Oral)    Resp 12    Ht _0  (1.702 m)    Wt 206 lb 12.7 oz (93.8 kg)    SpO2 100%    BMI 32.39 kg/m   GENERAL:alert, in no acute distress and comfortable SKIN: no acute rashes, no significant lesions EYES: conjunctiva are pink OROPHARYNX: MMM NECK: supple, no JVD LYMPH:  no palpable lymphadenopathy in the cervical, axillary or inguinal regions LUNGS: clear to auscultation b/l with normal respiratory effort, hemodialysis catheter in situ. HEART: regular rate & rhythm ABDOMEN:  normoactive bowel sounds , non tender, not distended. Extremity: 1+ bilateral pedal edema PSYCH: alert & oriented x 3 with fluent speech NEURO: no focal motor/sensory deficits    Intake/Output from previous day:  LABORATORY DATA:   . CBC Latest Ref Rng & Units 07/02/2019 07/01/2019 07/01/2019  WBC 4.0 - 10.5 K/uL 5.2 - 5.7  Hemoglobin 13.0 - 17.0 g/dL 8.6(L) 8.8(L) 9.4(L)  Hematocrit 39.0 -  52.0 % 25.7(L) 26.0(L) 28.6(L)  Platelets 150 - 400 K/uL 153 - 180    CBC    Component Value Date/Time   WBC 5.2 07/02/2019 0553   RBC 2.65 (L) 07/02/2019 0553   HGB 8.6 (L) 07/02/2019 0553   HGB 7.9 (L) 06/23/2019 1320   HGB 9.7 (L) 11/04/2018 1515   HCT 25.7 (L) 07/02/2019 0553   HCT 28.0 (L) 11/04/2018 1515   PLT 153 07/02/2019 0553   PLT 219 06/23/2019 1320   PLT 249 11/04/2018 1515   MCV 97.0 07/02/2019  0553   MCV 93 11/04/2018 1515   MCH 32.5 07/02/2019 0553   MCHC 33.5 07/02/2019 0553   RDW 12.9 07/02/2019 0553   RDW 13.6 11/04/2018 1515   LYMPHSABS 1.4 07/02/2019 0553   LYMPHSABS 1.5 11/04/2018 1515   MONOABS 0.6 07/02/2019 0553   EOSABS 0.1 07/02/2019 0553   EOSABS 0.1 11/04/2018 1515   BASOSABS 0.0 07/02/2019 0553   BASOSABS 0.0 11/04/2018 1515   . CMP Latest Ref Rng & Units 07/02/2019 07/01/2019 07/01/2019  Glucose 70 - 99 mg/dL 88 112(H) 123(H)  BUN 8 - 23 mg/dL 68(H) 77(H) 73(H)  Creatinine 0.61 - 1.24 mg/dL 5.29(H) 5.40(H) 5.36(H)  Sodium 135 - 145 mmol/L 141 140 139  Potassium 3.5 - 5.1 mmol/L 3.9 3.7 3.9  Chloride 98 - 111 mmol/L 113(H) 104 108  CO2 22 - 32 mmol/L 18(L) - 19(L)  Calcium 8.9 - 10.3 mg/dL 9.1 - 9.1  Total Protein 6.5 - 8.1 g/dL - - -  Total Bilirubin 0.3 - 1.2 mg/dL - - -  Alkaline Phos 38 - 126 U/L - - -  AST 15 - 41 U/L - - -  ALT 0 - 44 U/L - - -    US Renal  Result Date: 06/26/2019 CLINICAL DATA:  Acute kidney injury. EXAM: RENAL / URINARY TRACT ULTRASOUND COMPLETE COMPARISON:  06/04/2019 FINDINGS: Right Kidney: Renal measurements: 10.9 x 4.9 x 6.1 cm. = volume: 170 mL. Increased cortical echogenicity. No hydronephrosis. Multiple right kidney cysts. The dominant cyst is complex with solid and cystic components arising from the lower pole measuring 3.8 x 4.0 x 4.3 cm. Left Kidney: Renal measurements: 12.0 x 6.3 x 6.4 cm. = volume: 249.3 mL. Increased echogenicity. No hydronephrosis. Multiple lesions within the left kidney are again identified. The dominant lesion appears solid and arises from the upper pole of the left kidney measuring 7.6 x 5.5 x 6.5 cm. Other lesions have imaging features of simple cysts. Bladder: There is marked enlargement of the prostate gland which has mass effect upon the bladder. Other: None IMPRESSION: Bilateral increased renal cortical echogenicity compatible with chronic medical renal disease. No hydronephrosis. Multiple  bilateral simple appearing and complex kidney lesions. Solid and cystic lesion within the inferior pole of the right kidney is noted measuring 4.3 cm. Cystic renal cell carcinoma cannot be excluded. A solid-appearing lesion arises from the upper pole of the left kidney measuring 7.6 cm. As mentioned on previous imaging findings are concerning for renal cell carcinoma. Prostate gland enlargement. Electronically Signed   By: Kerby Moors M.D.   On: 06/26/2019 04:38   US Renal  Result Date: 06/04/2019 CLINICAL DATA:  Acute kidney injury. EXAM: RENAL / URINARY TRACT ULTRASOUND COMPLETE COMPARISON:  Abdominopelvic CT 06/01/2019. Renal ultrasound 06/06/2018. PET-CT 08/19/2018. FINDINGS: Right Kidney: Renal measurements: 13.0 x 5.6 x 4.9 cm = volume: 185.1 mL. There is a simple appearing cystic lesion in the upper interpolar region measuring 3.0 x 2.6  x 2.7 cm. In the lower pole, there is a mildly septated cystic lesion measuring 4.2 x 3.3 x 3.7 cm. Numerous other smaller cystic lesions are present. No hydronephrosis. Left Kidney: Renal measurements: 10.5 x 6.2 x 4.4 cm = volume: 151 mL. The known solid mass in the upper pole which was hypermetabolic on previous PET-CT shows continued enlargement as seen on earlier CT. This now measures 7.3 x 6.0 x 8.2 cm (4.9 x 4.5 x 4.5 cm on previous ultrasound of 1 year ago). Color Doppler analysis of this lesion is suboptimal. There are additional cystic lesions in the interpolar region, measuring up to 4.2 and 3.5 cm respectively. No hydronephrosis. Bladder: Median lobe prostate hypertrophy protrudes into the bladder lumen as seen on previous studies. There is a separate filling defect which was incompletely evaluated by this examination, probably reflecting blood clot. Patient was unable to complete the examination, needing to void. IMPRESSION: 1. Known solid mass involving the upper pole of the left kidney shows continued enlargement as seen on earlier CT scan. This remains  consistent with neoplasm, likely renal cell carcinoma. Please refer to recommendations on CT scan performed 06/01/2019. 2. Additional simple and complex cystic lesions bilaterally. No hydronephrosis. 3. Filling defect in the urinary bladder lumen adjacent to an enlarged median lobe, probably blood clot. This was incompletely evaluated by this examination. Electronically Signed   By: Richardean Sale M.D.   On: 06/04/2019 11:38   Ir Fluoro Guide Cv Line Right  Result Date: 06/28/2019 CLINICAL DATA:  Renal failure, needs tunneled pheresis catheter, with possible future use for hemodialysis. EXAM: TUNNELED PHERESIS/HEMODIALYSIS CATHETER PLACEMENT WITH ULTRASOUND AND FLUOROSCOPIC GUIDANCE TECHNIQUE: The procedure, risks, benefits, and alternatives were explained to the patient. Questions regarding the procedure were encouraged and answered. The patient understands and consents to the procedure. As antibiotic prophylaxis, vancomycin 1 g was ordered pre-procedure and administered intravenously within one hour of incision.Patency of the right IJ vein was confirmed with ultrasound with image documentation. An appropriate skin site was determined. Region was prepped using maximum barrier technique including cap and mask, sterile gown, sterile gloves, large sterile sheet, and Chlorhexidine as cutaneous antisepsis. The region was infiltrated locally with 1% lidocaine. Intravenous Fentanyl 39mg and Versed 117mwere administered as conscious sedation during continuous monitoring of the patient's level of consciousness and physiological / cardiorespiratory status by the radiology RN, with a total moderate sedation time of 10 minutes. Under real-time ultrasound guidance, the right IJ vein was accessed with a 21 gauge micropuncture needle; the needle tip within the vein was confirmed with ultrasound image documentation. Needle exchanged over the 018 guidewire for transitional dilator, which allowed advancement of a Benson wire  into the IVC. Over this, an MPA catheter was advanced. A Palindrome 23 hemodialysis catheter was tunneled from the right anterior chest wall approach to the right IJ dermatotomy site. The MPA catheter was exchanged over an Amplatz wire for serial vascular dilators which allow placement of a peel-away sheath, through which the catheter was advanced under intermittent fluoroscopy, positioned with its tips in the proximal and midright atrium. Spot chest radiograph confirms good catheter position. No pneumothorax. Catheter was flushed and primed per protocol. Catheter secured externally with O Prolene sutures. The right IJ dermatotomy site was closed with Dermabond. COMPLICATIONS: COMPLICATIONS None immediate FLUOROSCOPY TIME:  0.9 minute; 118  uGym2 DAP COMPARISON:  None IMPRESSION: 1. Technically successful placement of tunneled right IJ hemodialysis/pheresis catheter with ultrasound and fluoroscopic guidance. Ready for routine use. ACCESS: Remains approachable  for percutaneous intervention as needed. Electronically Signed   By: Lucrezia Europe M.D.   On: 06/28/2019 17:06   Ir US Guide Vasc Access Right  Result Date: 06/28/2019 CLINICAL DATA:  Renal failure, needs tunneled pheresis catheter, with possible future use for hemodialysis. EXAM: TUNNELED PHERESIS/HEMODIALYSIS CATHETER PLACEMENT WITH ULTRASOUND AND FLUOROSCOPIC GUIDANCE TECHNIQUE: The procedure, risks, benefits, and alternatives were explained to the patient. Questions regarding the procedure were encouraged and answered. The patient understands and consents to the procedure. As antibiotic prophylaxis, vancomycin 1 g was ordered pre-procedure and administered intravenously within one hour of incision.Patency of the right IJ vein was confirmed with ultrasound with image documentation. An appropriate skin site was determined. Region was prepped using maximum barrier technique including cap and mask, sterile gown, sterile gloves, large sterile sheet, and  Chlorhexidine as cutaneous antisepsis. The region was infiltrated locally with 1% lidocaine. Intravenous Fentanyl 31mg and Versed 110mwere administered as conscious sedation during continuous monitoring of the patient's level of consciousness and physiological / cardiorespiratory status by the radiology RN, with a total moderate sedation time of 10 minutes. Under real-time ultrasound guidance, the right IJ vein was accessed with a 21 gauge micropuncture needle; the needle tip within the vein was confirmed with ultrasound image documentation. Needle exchanged over the 018 guidewire for transitional dilator, which allowed advancement of a Benson wire into the IVC. Over this, an MPA catheter was advanced. A Palindrome 23 hemodialysis catheter was tunneled from the right anterior chest wall approach to the right IJ dermatotomy site. The MPA catheter was exchanged over an Amplatz wire for serial vascular dilators which allow placement of a peel-away sheath, through which the catheter was advanced under intermittent fluoroscopy, positioned with its tips in the proximal and midright atrium. Spot chest radiograph confirms good catheter position. No pneumothorax. Catheter was flushed and primed per protocol. Catheter secured externally with O Prolene sutures. The right IJ dermatotomy site was closed with Dermabond. COMPLICATIONS: COMPLICATIONS None immediate FLUOROSCOPY TIME:  0.9 minute; 118  uGym2 DAP COMPARISON:  None IMPRESSION: 1. Technically successful placement of tunneled right IJ hemodialysis/pheresis catheter with ultrasound and fluoroscopic guidance. Ready for routine use. ACCESS: Remains approachable for percutaneous intervention as needed. Electronically Signed   By: D Lucrezia Europe.D.   On: 06/28/2019 17:06   Dg Foot 2 Views Left  Result Date: 06/03/2019 CLINICAL DATA:  One week of great toe swelling and pain EXAM: LEFT FOOT - 2 VIEW COMPARISON:  None. FINDINGS: Soft tissue swelling medial to the first MTP  joint with soft tissue calcification. Mild hallux valgus. No definite juxta articular or marginal erosion. IMPRESSION: Swelling and soft tissue calcification at the first MTP joint, suspect gout. Electronically Signed   By: JoMonte Fantasia.D.   On: 06/03/2019 05:40   Vas UsKoreapper Ext Vein Mapping (pre-op Avf)  Result Date: 07/01/2019 UPPER EXTREMITY VEIN MAPPING  Indications: Pre-access. Comparison Study: No prior study Performing Technologist: MiMaudry MayhewHA, RDMS, RVT, RDCS  Examination Guidelines: A complete evaluation includes B-mode imaging, spectral Doppler, color Doppler, and power Doppler as needed of all accessible portions of each vessel. Bilateral testing is considered an integral part of a complete examination. Limited examinations for reoccurring indications may be performed as noted. +-----------------+-------------+----------+--------------+  Right Cephalic    Diameter (cm) Depth (cm)    Findings     +-----------------+-------------+----------+--------------+  Shoulder              0.17         1.35                    +-----------------+-------------+----------+--------------+  Prox upper arm        0.19         0.56                    +-----------------+-------------+----------+--------------+  Mid upper arm         0.13         0.23                    +-----------------+-------------+----------+--------------+  Dist upper arm        0.10         0.28                    +-----------------+-------------+----------+--------------+  Antecubital fossa     0.28         0.33                    +-----------------+-------------+----------+--------------+  Prox forearm          0.16         0.45                    +-----------------+-------------+----------+--------------+  Mid forearm           0.12         0.43                    +-----------------+-------------+----------+--------------+  Dist forearm          0.13         0.36                     +-----------------+-------------+----------+--------------+  Wrist                                      not visualized  +-----------------+-------------+----------+--------------+ +-----------------+-------------+----------+--------------+  Right Basilic     Diameter (cm) Depth (cm)    Findings     +-----------------+-------------+----------+--------------+  Prox upper arm                             not visualized  +-----------------+-------------+----------+--------------+  Mid upper arm                              not visualized  +-----------------+-------------+----------+--------------+  Dist upper arm        0.38                                 +-----------------+-------------+----------+--------------+  Antecubital fossa     0.24                   branching     +-----------------+-------------+----------+--------------+  Prox forearm                               not visualized  +-----------------+-------------+----------+--------------+  Mid forearm                                not visualized  +-----------------+-------------+----------+--------------+  Distal forearm  not visualized  +-----------------+-------------+----------+--------------+  Wrist                                      not visualized  +-----------------+-------------+----------+--------------+ +-----------------+-------------+----------+--------------+  Left Cephalic     Diameter (cm) Depth (cm)    Findings     +-----------------+-------------+----------+--------------+  Shoulder              0.08         0.25                    +-----------------+-------------+----------+--------------+  Prox upper arm        0.08         0.22                    +-----------------+-------------+----------+--------------+  Mid upper arm         0.10         0.26                    +-----------------+-------------+----------+--------------+  Dist upper arm        0.11         0.22                     +-----------------+-------------+----------+--------------+  Antecubital fossa     0.21         0.22                    +-----------------+-------------+----------+--------------+  Prox forearm          0.15         0.31                    +-----------------+-------------+----------+--------------+  Mid forearm           0.09         0.18                    +-----------------+-------------+----------+--------------+  Dist forearm                               not visualized  +-----------------+-------------+----------+--------------+  Wrist                                      not visualized  +-----------------+-------------+----------+--------------+ +-----------------+-------------+----------+--------------+  Left Basilic      Diameter (cm) Depth (cm)    Findings     +-----------------+-------------+----------+--------------+  Prox upper arm        0.46                                 +-----------------+-------------+----------+--------------+  Mid upper arm         0.27                                 +-----------------+-------------+----------+--------------+  Dist upper arm        0.32                                 +-----------------+-------------+----------+--------------+  Antecubital  fossa     0.27                                 +-----------------+-------------+----------+--------------+  Prox forearm          0.16                                 +-----------------+-------------+----------+--------------+  Mid forearm                                not visualized  +-----------------+-------------+----------+--------------+  Distal forearm                             not visualized  +-----------------+-------------+----------+--------------+  Wrist                                      not visualized  +-----------------+-------------+----------+--------------+ *See table(s) above for measurements and observations.  Diagnosing physician: Servando Snare MD Electronically signed by Servando Snare MD on 07/01/2019  at 4:27:38 PM.    Final     ASSESSMENT AND PLAN: 73 y.o. male with  1) Progressive Kappa light chain Multiple Myeloma -- with likely progression since patient abandoned treatment earlier this year.   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  Some concern with urinary retention.  3) Anemia due to CKD, myeloma , RCC  4) RCC - 7.6 cms in upper pole of rt kidney.  PLAN -Patient did not have any prohibitive toxicities from his first dose of Velcade 1.5 mg/m that he received on 10/27. -Patient tolerated his 3 sessions of plasmapheresis without any significant toxicities. -No additional plasmapheresis planned at this time. -Repeat kappa lambda free light chains on 07/02/2019. -PT OT evaluation to determine need for home care services. -transfuse prn for hgb<8 -High likelihood of renal and fluid overload related decompensation when steroids are added as a part of his treatment and for possible need of transfusion support and tumor lysis. -He will need close nephrology follow-up to monitor for dialysis needs. -Will likely need to use a lower dose of steroids. -We will plan to start on acyclovir in the next several days for VZV prophylaxis in the setting of Velcade therapy will start on clinic follow-up. -Will need stabilization of his renal function/decision on dialysis and stability and some improvement in his myeloma function prior to consideration of nephrectomy. -It is likely given current trend of renal function the nephrectomy will likely have a high chance of making him dialysis dependent. -No further oncologic intervention planned as inpatient at this time.  Okay to discharge from an oncology perspective. -We shall set him up for outpatient follow-up for oncology to continue his weekly Velcade Cytoxan and dexamethasone with next treatment due around 07/08/2019. -He will need close nephrology follow-up as outpatient on discharge. -Home with home health services versus SNF  based on PT and OT evaluation will defer to hospitalist on this.  Sullivan Lone MD MS Hematology/Oncology Physician [facility]  Total time spent 35 minutes more than 50% of time on direct patient contact counseling and coordination of care

## 2019-07-02 NOTE — Progress Notes (Signed)
NURSING PROGRESS NOTE  [name] [name] [id number] Discharge Data: 07/02/2019 3:38 PM Attending Provider: Karie Kirks, DO QBH:ALPFXT, Carroll Sage, FNP     [name] [name] to be D/C'd Home per MD order.  Discussed with the patient the After Visit Summary and all questions fully answered. All IV's discontinued with no bleeding noted. All belongings returned to patient for patient to take home.   Last Vital Signs:  Blood pressure 137/89, pulse 69, temperature (!) 97.5 F (36.4 C), temperature source Oral, resp. rate 16, height 5\' 7"  (1.702 m), weight 93.8 kg, SpO2 100 %.  Discharge Medication List Allergies as of 07/02/2019      Reactions   Amoxicillin Other (See Comments)   Tolerates Unasyn. Can't move Has patient had a PCN reaction causing immediate rash, facial/tongue/throat swelling, SOB or lightheadedness with hypotension: No Has patient had a PCN reaction causing severe rash involving mucus membranes or skin necrosis: No Has patient had a PCN reaction that required hospitalization No Has patient had a PCN reaction occurring within the last 10 years: No If all of the above answers are "NO", then may proceed with Cephalosporin use.   Penicillins Other (See Comments)   Tolerates Unasyn. Can't move/dizziness Has patient had a PCN reaction causing immediate rash, facial/tongue/throat swelling, SOB or lightheadedness with hypotension: No Has patient had a PCN reaction causing severe rash involving mucus membranes or skin necrosis: No Has patient had a PCN reaction that required hospitalization No Has patient had a PCN reaction occurring within the last 10 years: No If all of the above answers are "NO", then may proceed with Cephalosporin use.      Medication List    STOP taking these medications   finasteride 5 MG tablet Commonly known as: PROSCAR   potassium chloride SA 20 MEQ tablet Commonly known as: KLOR-CON     TAKE these medications   amLODipine 10 MG tablet Commonly known as:  NORVASC Take 1 tablet (10 mg total) by mouth daily. Notes to patient: 07/03/2019   atorvastatin 80 MG tablet Commonly known as: LIPITOR Take 1 tablet (80 mg total) by mouth daily. Notes to patient: 07/02/2019   cetirizine 10 MG tablet Commonly known as: ZYRTEC Take 10 mg by mouth daily as needed (seasonal allergies).   feeding supplement (ENSURE ENLIVE) Liqd Take 237 mLs by mouth 2 (two) times daily between meals. Notes to patient: 07/02/2019   fluticasone 50 MCG/ACT nasal spray Commonly known as: FLONASE Place 2 sprays into both nostrils daily as needed (seasonal allergies).   glucose blood test strip Check blood sugar twice per day. E.11.9 Notes to patient: Continue home schedule    tamsulosin 0.4 MG Caps capsule Commonly known as: FLOMAX Take 0.4 mg by mouth daily. Notes to patient: 07/02/2019

## 2019-07-02 NOTE — TOC Transition Note (Signed)
Transition of Care [facility]) - CM/SW Discharge Note   Patient Details  Name: CHAUN UEMURA MRN: [id number] Date of Birth: 09/28/1945  Transition of Care [facility]) CM/SW Contact:  Alexander Mt, [place] Phone Number: 07/02/2019, 3:20 PM   Clinical Narrative:    Pt stable for dc home- now states he has no way to get home.  Confirmed address on facesheet and provided pt RN with cab voucher.   Final next level of care: Home/Self Care Barriers to Discharge: Barriers Resolved   Patient Goals and CMS Choice Patient states their goals for this hospitalization and ongoing recovery are:: to go home CMS Medicare.gov Compare Post Acute Care list provided to:: Patient Choice offered to / list presented to : NA  Discharge Placement  Discharge Plan and Services       DME Arranged: N/A HH Arranged: NA  Social Determinants of Health (SDOH) Interventions     Readmission Risk Interventions Readmission Risk Prevention Plan 06/29/2019  Transportation Screening Complete  Medication Review Press photographer) Complete  PCP or Specialist appointment within 3-5 days of discharge Complete  HRI or [place] Complete  SW Recovery Care/Counseling Consult Complete  [place] Not Applicable  Some recent data might be hidden

## 2019-07-02 NOTE — Plan of Care (Signed)
  Problem: Activity: Goal: Risk for activity intolerance will decrease Outcome: Progressing   

## 2019-07-02 NOTE — Discharge Summary (Addendum)
Physician Discharge Summary  [name] [name]:[id number] DOB: [birth year] DOA: 06/25/2019  PCP: Scot Jun, FNP  Admit date: 06/25/2019 Discharge date: 07/02/2019  Recommendations for Outpatient Follow-up:  The patient will follow up with PCP as outpatient. The patient will follow up with Dr. Irene Limbo of hematology/oncology as directed by hematology/oncology. Follow up with [facility] as directed.   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 Etiology of AKI or CKD III is clinically undetermined. Multiple Myeloma: S/P three courses of plasmapheresis. Pt to follow up with Dr. Irene Limbo for further treatment as well as for initiation of treatment for suspected Renal Cell Carcinoma.  Anemia: Due to multiple myeloma and renal failure. Monitor.  BPH: Noted. Renal mass/concern for renal cell carcinoma: As per hematology oncology as outpatient. DM II Essential Hypertention Obesity  Discharge Condition: Fair  Disposition: Home  Diet recommendation: Carbohydrate controlled.  Filed Weights   06/26/19 0433 06/30/19 0005 07/02/19 0500  Weight: 93.6 kg 95.1 kg 93.8 kg    History of present illness:   [name] [name] is a 73 y.o. male with medical history significant of BPH, paroxysmal atrial fibrillation, CAD, chronic diastolic congestive heart failure, CKD stage III, colon cancer, light chain myeloma, renal cell carcinoma, COPD, type 2 diabetes, hypertension, hyperlipidemia being sent to the ED by his oncologist Dr. Irene Limbo after he was noted to have worsening of his renal function with creatinine up to 5.7.  He also reported symptoms concerning for urinary retention.  Patient states he has had problems emptying his bladder fully and is seen by urology.  He feels this problem is now getting worse.  It takes multiple attempts for him to fully empty his bladder.  States he was seen by urology a week ago as he was noticing blood in his  urine.  States he is supposed to see nephrology for his kidney problem but does not have an appointment yet.   ED Course: No acute urinary retention noted, bladder scan with 200 cc urine.  BUN 61, creatinine 5.9.  Renal function has been progressively worsening over time.  Hemoglobin 7.8, no significant change compared to recent labs.  Hospital Course:  [name] [name] is an 73 y.o. male with medical history significant of BPH, paroxysmal atrial fibrillation, CAD, chronic diastolic congestive heart failure, CKD stage III, colon cancer, light chain myeloma, renal cell carcinoma, COPD, type 2 diabetes, hypertension, hyperlipidemia being sent to the ED by his oncologist Dr. Irene Limbo after he was noted to have worsening of his renal function with creatinine up to 5.7.  He also reported symptoms concerning for urinary retention.  Patient states he has had problems emptying his bladder fully and is seen by urology.  He feels this problem is now getting worse.  It takes multiple attempts for him to fully empty his bladder.  States he was seen by urology a week ago as he was noticing blood in his urine.  States he is supposed to see nephrology for his kidney problem but does not have an appointment yet.  Now with catheter if HD needed- plan per oncology is to start his treatment and monitor Cr.    Pt went for 3rd plasmapheresis on 07/01/2019. He is reluctant to proceed with graft placement. Vein mapping in place.  Today's assessment: S: The patient is awake and alert. No new complaints. O: Vitals:  Vitals:   07/02/19 0920 07/02/19 1303  BP: (!) 142/88 137/89  Pulse: 60 69  Resp: 12 16  Temp: 97.6 F (36.4 C) (!) 97.5 F (36.4 C)  SpO2: 100% 100%    Exam:  Constitutional:  The patient is awake, alert, and oriented x 3. No acute distress. Respiratory:  No increased work of breathing. No wheezes, rales, or rhonchi No tactile fremitus Cardiovascular:  Regular rate and rhythm No murmurs, ectopy, or  gallups. No lateral PMI. No thrills. Abdomen:  Abdomen is soft, non-tender, non-distended No hernias, masses, or organomegaly Normoactive bowel sounds.  Musculoskeletal:  No cyanosis, clubbing, or edema Skin:  No rashes, lesions, ulcers palpation of skin: no induration or nodules Neurologic:  CN 2-12 intact Sensation all 4 extremities intact Psychiatric:  Mental status Mood, affect appropriate Orientation to person, place, time  judgment and insight appear intact  Discharge Instructions  Discharge Instructions     Activity as tolerated - No restrictions   Complete by: As directed    Call MD for:  difficulty breathing, headache or visual disturbances   Complete by: As directed    Call MD for:  persistant dizziness or light-headedness   Complete by: As directed    Call MD for:  persistant nausea and vomiting   Complete by: As directed    Call MD for:  severe uncontrolled pain   Complete by: As directed    Diet - low sodium heart healthy   Complete by: As directed    Diet Carb Modified   Complete by: As directed    Discharge instructions   Complete by: As directed    Follow up with [facility] as directed by nephrology. Follow up with Oncology as directed by oncology. Follow up with PCP in 7-10 days.   Increase activity slowly   Complete by: As directed       Allergies as of 07/02/2019       Reactions   Amoxicillin Other (See Comments)   Tolerates Unasyn. Can't move Has patient had a PCN reaction causing immediate rash, facial/tongue/throat swelling, SOB or lightheadedness with hypotension: No Has patient had a PCN reaction causing severe rash involving mucus membranes or skin necrosis: No Has patient had a PCN reaction that required hospitalization No Has patient had a PCN reaction occurring within the last 10 years: No If all of the above answers are "NO", then may proceed with Cephalosporin use.   Penicillins Other (See Comments)   Tolerates Unasyn.  Can't move/dizziness Has patient had a PCN reaction causing immediate rash, facial/tongue/throat swelling, SOB or lightheadedness with hypotension: No Has patient had a PCN reaction causing severe rash involving mucus membranes or skin necrosis: No Has patient had a PCN reaction that required hospitalization No Has patient had a PCN reaction occurring within the last 10 years: No If all of the above answers are "NO", then may proceed with Cephalosporin use.        Medication List     STOP taking these medications    finasteride 5 MG tablet Commonly known as: PROSCAR   potassium chloride SA 20 MEQ tablet Commonly known as: KLOR-CON       TAKE these medications    amLODipine 10 MG tablet Commonly known as: NORVASC Take 1 tablet (10 mg total) by mouth daily. Notes to patient: 07/03/2019   atorvastatin 80 MG tablet Commonly known as: LIPITOR Take 1 tablet (80 mg total) by mouth daily. Notes to patient: 07/02/2019   cetirizine 10 MG tablet Commonly known as: ZYRTEC Take 10 mg by mouth daily as needed (seasonal allergies).  feeding supplement (ENSURE ENLIVE) Liqd Take 237 mLs by mouth 2 (two) times daily between meals. Notes to patient: 07/02/2019   fluticasone 50 MCG/ACT nasal spray Commonly known as: FLONASE Place 2 sprays into both nostrils daily as needed (seasonal allergies).   glucose blood test strip Check blood sugar twice per day. E.11.9 Notes to patient: Continue home schedule    tamsulosin 0.4 MG Caps capsule Commonly known as: FLOMAX Take 0.4 mg by mouth daily. Notes to patient: 07/02/2019       Allergies  Allergen Reactions   Amoxicillin Other (See Comments)    Tolerates Unasyn. Can't move Has patient had a PCN reaction causing immediate rash, facial/tongue/throat swelling, SOB or lightheadedness with hypotension: No Has patient had a PCN reaction causing severe rash involving mucus membranes or skin necrosis: No Has patient had a PCN reaction  that required hospitalization No Has patient had a PCN reaction occurring within the last 10 years: No If all of the above answers are "NO", then may proceed with Cephalosporin use.    Penicillins Other (See Comments)    Tolerates Unasyn. Can't move/dizziness Has patient had a PCN reaction causing immediate rash, facial/tongue/throat swelling, SOB or lightheadedness with hypotension: No Has patient had a PCN reaction causing severe rash involving mucus membranes or skin necrosis: No Has patient had a PCN reaction that required hospitalization No Has patient had a PCN reaction occurring within the last 10 years: No If all of the above answers are "NO", then may proceed with Cephalosporin use.      The results of significant diagnostics from this hospitalization (including imaging, microbiology, ancillary and laboratory) are listed below for reference.    Significant Diagnostic Studies: US Renal  Result Date: 06/26/2019 CLINICAL DATA:  Acute kidney injury. EXAM: RENAL / URINARY TRACT ULTRASOUND COMPLETE COMPARISON:  06/04/2019 FINDINGS: Right Kidney: Renal measurements: 10.9 x 4.9 x 6.1 cm. = volume: 170 mL. Increased cortical echogenicity. No hydronephrosis. Multiple right kidney cysts. The dominant cyst is complex with solid and cystic components arising from the lower pole measuring 3.8 x 4.0 x 4.3 cm. Left Kidney: Renal measurements: 12.0 x 6.3 x 6.4 cm. = volume: 249.3 mL. Increased echogenicity. No hydronephrosis. Multiple lesions within the left kidney are again identified. The dominant lesion appears solid and arises from the upper pole of the left kidney measuring 7.6 x 5.5 x 6.5 cm. Other lesions have imaging features of simple cysts. Bladder: There is marked enlargement of the prostate gland which has mass effect upon the bladder. Other: None IMPRESSION: Bilateral increased renal cortical echogenicity compatible with chronic medical renal disease. No hydronephrosis. Multiple bilateral  simple appearing and complex kidney lesions. Solid and cystic lesion within the inferior pole of the right kidney is noted measuring 4.3 cm. Cystic renal cell carcinoma cannot be excluded. A solid-appearing lesion arises from the upper pole of the left kidney measuring 7.6 cm. As mentioned on previous imaging findings are concerning for renal cell carcinoma. Prostate gland enlargement. Electronically Signed   By: Kerby Moors M.D.   On: 06/26/2019 04:38   US Renal  Result Date: 06/04/2019 CLINICAL DATA:  Acute kidney injury. EXAM: RENAL / URINARY TRACT ULTRASOUND COMPLETE COMPARISON:  Abdominopelvic CT 06/01/2019. Renal ultrasound 06/06/2018. PET-CT 08/19/2018. FINDINGS: Right Kidney: Renal measurements: 13.0 x 5.6 x 4.9 cm = volume: 185.1 mL. There is a simple appearing cystic lesion in the upper interpolar region measuring 3.0 x 2.6 x 2.7 cm. In the lower pole, there is a mildly septated cystic  lesion measuring 4.2 x 3.3 x 3.7 cm. Numerous other smaller cystic lesions are present. No hydronephrosis. Left Kidney: Renal measurements: 10.5 x 6.2 x 4.4 cm = volume: 151 mL. The known solid mass in the upper pole which was hypermetabolic on previous PET-CT shows continued enlargement as seen on earlier CT. This now measures 7.3 x 6.0 x 8.2 cm (4.9 x 4.5 x 4.5 cm on previous ultrasound of 1 year ago). Color Doppler analysis of this lesion is suboptimal. There are additional cystic lesions in the interpolar region, measuring up to 4.2 and 3.5 cm respectively. No hydronephrosis. Bladder: Median lobe prostate hypertrophy protrudes into the bladder lumen as seen on previous studies. There is a separate filling defect which was incompletely evaluated by this examination, probably reflecting blood clot. Patient was unable to complete the examination, needing to void. IMPRESSION: 1. Known solid mass involving the upper pole of the left kidney shows continued enlargement as seen on earlier CT scan. This remains consistent  with neoplasm, likely renal cell carcinoma. Please refer to recommendations on CT scan performed 06/01/2019. 2. Additional simple and complex cystic lesions bilaterally. No hydronephrosis. 3. Filling defect in the urinary bladder lumen adjacent to an enlarged median lobe, probably blood clot. This was incompletely evaluated by this examination. Electronically Signed   By: Richardean Sale M.D.   On: 06/04/2019 11:38   Ir Fluoro Guide Cv Line Right  Result Date: 06/28/2019 CLINICAL DATA:  Renal failure, needs tunneled pheresis catheter, with possible future use for hemodialysis. EXAM: TUNNELED PHERESIS/HEMODIALYSIS CATHETER PLACEMENT WITH ULTRASOUND AND FLUOROSCOPIC GUIDANCE TECHNIQUE: The procedure, risks, benefits, and alternatives were explained to the patient. Questions regarding the procedure were encouraged and answered. The patient understands and consents to the procedure. As antibiotic prophylaxis, vancomycin 1 g was ordered pre-procedure and administered intravenously within one hour of incision.Patency of the right IJ vein was confirmed with ultrasound with image documentation. An appropriate skin site was determined. Region was prepped using maximum barrier technique including cap and mask, sterile gown, sterile gloves, large sterile sheet, and Chlorhexidine as cutaneous antisepsis. The region was infiltrated locally with 1% lidocaine. Intravenous Fentanyl 69mg and Versed 193mwere administered as conscious sedation during continuous monitoring of the patient's level of consciousness and physiological / cardiorespiratory status by the radiology RN, with a total moderate sedation time of 10 minutes. Under real-time ultrasound guidance, the right IJ vein was accessed with a 21 gauge micropuncture needle; the needle tip within the vein was confirmed with ultrasound image documentation. Needle exchanged over the 018 guidewire for transitional dilator, which allowed advancement of a Benson wire into the  IVC. Over this, an MPA catheter was advanced. A Palindrome 23 hemodialysis catheter was tunneled from the right anterior chest wall approach to the right IJ dermatotomy site. The MPA catheter was exchanged over an Amplatz wire for serial vascular dilators which allow placement of a peel-away sheath, through which the catheter was advanced under intermittent fluoroscopy, positioned with its tips in the proximal and midright atrium. Spot chest radiograph confirms good catheter position. No pneumothorax. Catheter was flushed and primed per protocol. Catheter secured externally with O Prolene sutures. The right IJ dermatotomy site was closed with Dermabond. COMPLICATIONS: COMPLICATIONS None immediate FLUOROSCOPY TIME:  0.9 minute; 118  uGym2 DAP COMPARISON:  None IMPRESSION: 1. Technically successful placement of tunneled right IJ hemodialysis/pheresis catheter with ultrasound and fluoroscopic guidance. Ready for routine use. ACCESS: Remains approachable for percutaneous intervention as needed. Electronically Signed   By: D Lucrezia Europe  M.D.   On: 06/28/2019 17:06   Ir US Guide Vasc Access Right  Result Date: 06/28/2019 CLINICAL DATA:  Renal failure, needs tunneled pheresis catheter, with possible future use for hemodialysis. EXAM: TUNNELED PHERESIS/HEMODIALYSIS CATHETER PLACEMENT WITH ULTRASOUND AND FLUOROSCOPIC GUIDANCE TECHNIQUE: The procedure, risks, benefits, and alternatives were explained to the patient. Questions regarding the procedure were encouraged and answered. The patient understands and consents to the procedure. As antibiotic prophylaxis, vancomycin 1 g was ordered pre-procedure and administered intravenously within one hour of incision.Patency of the right IJ vein was confirmed with ultrasound with image documentation. An appropriate skin site was determined. Region was prepped using maximum barrier technique including cap and mask, sterile gown, sterile gloves, large sterile sheet, and Chlorhexidine  as cutaneous antisepsis. The region was infiltrated locally with 1% lidocaine. Intravenous Fentanyl 54mg and Versed 122mwere administered as conscious sedation during continuous monitoring of the patient's level of consciousness and physiological / cardiorespiratory status by the radiology RN, with a total moderate sedation time of 10 minutes. Under real-time ultrasound guidance, the right IJ vein was accessed with a 21 gauge micropuncture needle; the needle tip within the vein was confirmed with ultrasound image documentation. Needle exchanged over the 018 guidewire for transitional dilator, which allowed advancement of a Benson wire into the IVC. Over this, an MPA catheter was advanced. A Palindrome 23 hemodialysis catheter was tunneled from the right anterior chest wall approach to the right IJ dermatotomy site. The MPA catheter was exchanged over an Amplatz wire for serial vascular dilators which allow placement of a peel-away sheath, through which the catheter was advanced under intermittent fluoroscopy, positioned with its tips in the proximal and midright atrium. Spot chest radiograph confirms good catheter position. No pneumothorax. Catheter was flushed and primed per protocol. Catheter secured externally with O Prolene sutures. The right IJ dermatotomy site was closed with Dermabond. COMPLICATIONS: COMPLICATIONS None immediate FLUOROSCOPY TIME:  0.9 minute; 118  uGym2 DAP COMPARISON:  None IMPRESSION: 1. Technically successful placement of tunneled right IJ hemodialysis/pheresis catheter with ultrasound and fluoroscopic guidance. Ready for routine use. ACCESS: Remains approachable for percutaneous intervention as needed. Electronically Signed   By: D Lucrezia Europe.D.   On: 06/28/2019 17:06   Dg Foot 2 Views Left  Result Date: 06/03/2019 CLINICAL DATA:  One week of great toe swelling and pain EXAM: LEFT FOOT - 2 VIEW COMPARISON:  None. FINDINGS: Soft tissue swelling medial to the first MTP joint with soft  tissue calcification. Mild hallux valgus. No definite juxta articular or marginal erosion. IMPRESSION: Swelling and soft tissue calcification at the first MTP joint, suspect gout. Electronically Signed   By: JoMonte Fantasia.D.   On: 06/03/2019 05:40   Vas UsKoreapper Ext Vein Mapping (pre-op Avf)  Result Date: 07/01/2019 UPPER EXTREMITY VEIN MAPPING  Indications: Pre-access. Comparison Study: No prior study Performing Technologist: MiMaudry MayhewHA, RDMS, RVT, RDCS  Examination Guidelines: A complete evaluation includes B-mode imaging, spectral Doppler, color Doppler, and power Doppler as needed of all accessible portions of each vessel. Bilateral testing is considered an integral part of a complete examination. Limited examinations for reoccurring indications may be performed as noted. +-----------------+-------------+----------+--------------+  Right Cephalic    Diameter (cm) Depth (cm)    Findings     +-----------------+-------------+----------+--------------+  Shoulder              0.17         1.35                    +-----------------+-------------+----------+--------------+  Prox upper arm        0.19         0.56                    +-----------------+-------------+----------+--------------+  Mid upper arm         0.13         0.23                    +-----------------+-------------+----------+--------------+  Dist upper arm        0.10         0.28                    +-----------------+-------------+----------+--------------+  Antecubital fossa     0.28         0.33                    +-----------------+-------------+----------+--------------+  Prox forearm          0.16         0.45                    +-----------------+-------------+----------+--------------+  Mid forearm           0.12         0.43                    +-----------------+-------------+----------+--------------+  Dist forearm          0.13         0.36                    +-----------------+-------------+----------+--------------+   Wrist                                      not visualized  +-----------------+-------------+----------+--------------+ +-----------------+-------------+----------+--------------+  Right Basilic     Diameter (cm) Depth (cm)    Findings     +-----------------+-------------+----------+--------------+  Prox upper arm                             not visualized  +-----------------+-------------+----------+--------------+  Mid upper arm                              not visualized  +-----------------+-------------+----------+--------------+  Dist upper arm        0.38                                 +-----------------+-------------+----------+--------------+  Antecubital fossa     0.24                   branching     +-----------------+-------------+----------+--------------+  Prox forearm                               not visualized  +-----------------+-------------+----------+--------------+  Mid forearm                                not visualized  +-----------------+-------------+----------+--------------+  Distal forearm  not visualized  +-----------------+-------------+----------+--------------+  Wrist                                      not visualized  +-----------------+-------------+----------+--------------+ +-----------------+-------------+----------+--------------+  Left Cephalic     Diameter (cm) Depth (cm)    Findings     +-----------------+-------------+----------+--------------+  Shoulder              0.08         0.25                    +-----------------+-------------+----------+--------------+  Prox upper arm        0.08         0.22                    +-----------------+-------------+----------+--------------+  Mid upper arm         0.10         0.26                    +-----------------+-------------+----------+--------------+  Dist upper arm        0.11         0.22                    +-----------------+-------------+----------+--------------+  Antecubital fossa     0.21          0.22                    +-----------------+-------------+----------+--------------+  Prox forearm          0.15         0.31                    +-----------------+-------------+----------+--------------+  Mid forearm           0.09         0.18                    +-----------------+-------------+----------+--------------+  Dist forearm                               not visualized  +-----------------+-------------+----------+--------------+  Wrist                                      not visualized  +-----------------+-------------+----------+--------------+ +-----------------+-------------+----------+--------------+  Left Basilic      Diameter (cm) Depth (cm)    Findings     +-----------------+-------------+----------+--------------+  Prox upper arm        0.46                                 +-----------------+-------------+----------+--------------+  Mid upper arm         0.27                                 +-----------------+-------------+----------+--------------+  Dist upper arm        0.32                                 +-----------------+-------------+----------+--------------+  Antecubital  fossa     0.27                                 +-----------------+-------------+----------+--------------+  Prox forearm          0.16                                 +-----------------+-------------+----------+--------------+  Mid forearm                                not visualized  +-----------------+-------------+----------+--------------+  Distal forearm                             not visualized  +-----------------+-------------+----------+--------------+  Wrist                                      not visualized  +-----------------+-------------+----------+--------------+ *See table(s) above for measurements and observations.  Diagnosing physician: Servando Snare MD Electronically signed by Servando Snare MD on 07/01/2019 at 4:27:38 PM.    Final     Microbiology: Recent Results (from the past 240 hour(s))   SARS CORONAVIRUS 2 (TAT 6-24 HRS) Nasopharyngeal Nasopharyngeal Swab     Status: None   Collection Time: 06/26/19  2:48 AM   Specimen: Nasopharyngeal Swab  Result Value Ref Range Status   SARS Coronavirus 2 NEGATIVE NEGATIVE Final    Comment: (NOTE) SARS-CoV-2 target nucleic acids are NOT DETECTED. The SARS-CoV-2 RNA is generally detectable in upper and lower respiratory specimens during the acute phase of infection. Negative results do not preclude SARS-CoV-2 infection, do not rule out co-infections with other pathogens, and should not be used as the sole basis for treatment or other patient management decisions. Negative results must be combined with clinical observations, patient history, and epidemiological information. The expected result is Negative. Fact Sheet for Patients: SugarRoll.be Fact Sheet for Healthcare Providers: https://www.woods-mathews.com/ This test is not yet approved or cleared by the [place] FDA and  has been authorized for detection and/or diagnosis of SARS-CoV-2 by FDA under an Emergency Use Authorization (EUA). This EUA will remain  in effect (meaning this test can be used) for the duration of the COVID-19 declaration under Section 56 4(b)(1) of the Act, 21 U.S.C. section 360bbb-3(b)(1), unless the authorization is terminated or revoked sooner. Performed at [facility], [place] [address] [place], [place] [address]      Labs: Basic Metabolic Panel: Recent Labs  Lab 06/28/19 0535 06/29/19 0517  06/30/19 1600 07/01/19 0506 07/01/19 1016 07/01/19 1137 07/02/19 0553  NA 138 139   < > 139 138 139 140 141  K 3.2* 3.7   < > 4.3 3.9 3.9 3.7 3.9  CL 101 103   < > 109 107 108 104 113*  CO2 23 23  --  20* 20* 19*  --  18*  GLUCOSE 85 88   < > 120* 113* 123* 112* 88  BUN 71* 71*   < > 71* 73* 73* 77* 68*  CREATININE 5.56* 5.57*   < > 5.27* 5.63* 5.36* 5.40* 5.29*  CALCIUM 9.2 9.3  --  8.7* 8.9 9.1  --   9.1  PHOS 5.2* 5.2*  --  4.4  --   --   --   --    < > = values in this interval not displayed.   Liver Function Tests: Recent Labs  Lab 06/25/19 1741 06/28/19 0535 06/29/19 0517 06/30/19 1600  AST 28  --   --   --   ALT 41  --   --   --   ALKPHOS 161*  --   --   --   BILITOT 0.5  --   --   --   PROT 6.8  --   --   --   ALBUMIN 3.7 3.4* 3.2* 4.5   No results for input(s): LIPASE, AMYLASE in the last 168 hours. No results for input(s): AMMONIA in the last 168 hours. CBC: Recent Labs  Lab 06/29/19 0517  06/29/19 1623 06/30/19 1024 06/30/19 1600 07/01/19 0506 07/01/19 1137 07/02/19 0553  WBC 5.2  --  5.2  --  6.6 5.7  --  5.2  NEUTROABS  --   --  3.5  --   --  3.7  --  3.1  HGB 9.1*   < > 9.3* 10.5* 10.6* 9.4* 8.8* 8.6*  HCT 27.7*   < > 27.7* 31.0* 33.4* 28.6* 26.0* 25.7*  MCV 97.2  --  96.9  --  100.3* 98.3  --  97.0  PLT 189  --  199  --  199 180  --  153   < > = values in this interval not displayed.   Cardiac Enzymes: No results for input(s): CKTOTAL, CKMB, CKMBINDEX, TROPONINI in the last 168 hours. BNP: BNP (last 3 results) Recent Labs    09/09/18 1834  BNP 36.2    ProBNP (last 3 results) No results for input(s): PROBNP in the last 8760 hours.  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  Signed:        Kara Mierzejewski, DO [facility]  07/02/2019, 3:08 PM

## 2019-07-05 LAB — KAPPA/LAMBDA LIGHT CHAINS
Kappa free light chain: 10840.2 mg/L — ABNORMAL HIGH (ref 3.3–19.4)
Kappa, lambda light chain ratio: 401.49 — ABNORMAL HIGH (ref 0.26–1.65)
Lambda free light chains: 27 mg/L — ABNORMAL HIGH (ref 5.7–26.3)

## 2019-07-07 NOTE — Progress Notes (Signed)
HEMATOLOGY/ONCOLOGY CONSULTATION NOTE  Date of Service: 07/08/2019  Patient Care Team: Scot Jun, FNP as PCP - General (Family Medicine) Minus Breeding, MD as PCP - Cardiology (Cardiology)  CHIEF COMPLAINTS/PURPOSE OF CONSULTATION:  Follow up for his Renal Cell carcinoma & Myeloma  HISTORY OF PRESENTING ILLNESS:  [name] [name] is a wonderful 73 y.o. male who has 73 year old man PMH renal cell carcinoma, prostate cancer, multiple myeloma, COPD, CKD stage III PRESENTED to [facility] emergency Department for hematuria beginning after self-catheterization (BPH). Found to have acute kidney injury.   Seen in the emergency department 10/1 for left great toe pain, diagnosed with gout, found to have worsening renal function, admission recommended but patient declined at that time.  Last evening the patient self catheterized and developed acute hematuria and so came to the emergency department for further evaluation where he was found to have gross hematuria.  He denies any bladder pain.  Review of systems is unremarkable.  No COVID symptoms or known exposure.  No specific aggravating or alleviating factors.  He reports that he has follow-up with his urologist on the 16th.  He is aware of his diagnosis of myeloma and suspected kidney cancer but did not seem concerned about this.  He has not been eating very well but drinks a lot of water.  INTERVAL HISTORY:   [name] [name] is a wonderful 73 y.o. male who has 73 year old man who is here for a f/u of his renal cell carcinoma, and multiple myeloma. The patient's last visit with [place] was on 06/23/19. The pt reports that he is doing well overall.  The pt reports that he is doing well since being discharged from hospital (06/25/2019-07/02/2019) and that he is walking to stay active.  He is abiding by social distancing rules and using his mask regularly.   Lab results today (07/08/19) of CBC w/diff and CMP is as follows: all values are WNL  except for RBC at 2.42, Hemoglobin at 7.8, HCT at 23.5, CO2 at 19, BUN at 63, Creatinine at 5.82, Calcium at 8.7, Total protein at 6.3, GFR Est Non Af Am at 9, GFR Est AFR Am at 10.  On review of systems, pt reports he has some difficulties urinating, upset stomach and denies, difficulty breathing, leg swelling and any other symptoms.     MEDICAL HISTORY:  Past Medical History:  Diagnosis Date  . Abnormal CT of liver    a. nodular contour suggesting cirrhosis 05/2018.  [name] Abnormal LFTs (liver function tests)   . Anxiety   . Aortic root dilatation ([place])   . Ascending aortic aneurysm ([place])   . BPH with urinary obstruction   . Bradycardia   . 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 CAD (coronary artery disease)    a. 12/2015 NSTEMI/Cath (in setting of PAF):  LM nl, LAD 30p,  LCX 34m RCA  ok, AM 100%, RPDA1 40, RPDA 2 60 ->Med Rx.  . Childhood asthma   . Chronic diastolic CHF (congestive heart failure) ([place]    a. 12/2015 Echo: EF 50-55%, mod AI, mod Ao root dil, mild MR, mod dil LA, mod RA.  . CKD (chronic kidney disease), stage III   . Colon cancer ([place]   . COPD (chronic obstructive pulmonary disease) ([place]    pt denies at preop  . Diabetes mellitus type II    diet controlled  . History of syncope   .  Hyperlipidemia   . Hypertensive heart disease   . Kidney lump 04/04/2010   Overview:  Renal Cell Carcinoma   . Light chain myeloma ([place])   . Moderate aortic insufficiency    a. 12/2015 Echo: Mod AI.  [name] Paroxysmal atrial fibrillation ([place])    a. 12/2015 started on Xarelto (CHA2DS2VASc = 4-5).  . Pneumonia   . Prostate cancer ([place])   . Renal cell carcinoma ([place])   . Sleep apnea    Does not like  CPAP  . Spinal stenosis in cervical region 10/12/2016  . Venous insufficiency     SURGICAL HISTORY: Past Surgical History:  Procedure Laterality Date  . BACK SURGERY    . CARDIAC CATHETERIZATION N/A  12/18/2015   Procedure: Left Heart Cath and Coronary Angiography;  Surgeon: Leonie Man, MD;  Location: [facility];  Service: Cardiovascular;  Laterality: N/A;  . DIAGNOSTIC LAPAROSCOPY     partial colectomy  . IR FLUORO GUIDE CV LINE RIGHT  06/28/2019  . IR US GUIDE VASC ACCESS RIGHT  06/28/2019  . LAPAROSCOPIC PARTIAL COLECTOMY Right 05/21/2018   Procedure: LAPAROSCOPIC RIGHT  COLECTOMY ERAS PATHWAY;  Surgeon: Leighton Ruff, MD;  Location: [facility];  Service: General;  Laterality: Right;  . [place]   "replaced a disc"    SOCIAL HISTORY: Social History   Socioeconomic History  . Marital status: Widowed    Spouse name: Not on file  . Number of children: Not on file  . Years of education: Not on file  . Highest education level: Not on file  Occupational History  . Not on file  Social Needs  . Financial resource strain: Not on file  . Food insecurity    Worry: Not on file    Inability: Not on file  . Transportation needs    Medical: Not on file    Non-medical: Not on file  Tobacco Use  . Smoking status: Former Smoker    Types: Cigars    Quit date: 09/02/1977    Years since quitting: 41.8  . Smokeless tobacco: Never Used  . Tobacco comment:    Substance and Sexual Activity  . Alcohol use: Not Currently    Comment: "stopped drinking alcohol in ~ 1980; just drank a little on the weekends when I did drink"  . Drug use: No  . Sexual activity: Not Currently  Lifestyle  . Physical activity    Days per week: Not on file    Minutes per session: Not on file  . Stress: Not on file  Relationships  . Social Herbalist on phone: Not on file    Gets together: Not on file    Attends religious service: Not on file    Active member of club or organization: Not on file    Attends meetings of clubs or organizations: Not on file    Relationship status: Not on file  . Intimate partner violence    Fear of current or ex partner: Not on file     Emotionally abused: Not on file    Physically abused: Not on file    Forced sexual activity: Not on file  Other Topics Concern  . Not on file  Social History Narrative   Lives alone.  Wife died in 20-Mar-2023.  Lives in apartment.      FAMILY HISTORY: Family History  Problem Relation Age of Onset  . Emphysema Father   . Asthma Father   .  Liver disease Father        tumor  . Heart disease Mother   . Hypertension Mother   . Asthma Sister   . Hypertension Son     ALLERGIES:  is allergic to amoxicillin and penicillins.  MEDICATIONS:  Current Outpatient Medications  Medication Sig Dispense Refill  . amLODipine (NORVASC) 10 MG tablet Take 1 tablet (10 mg total) by mouth daily. 90 tablet 0  . atorvastatin (LIPITOR) 80 MG tablet Take 1 tablet (80 mg total) by mouth daily. (Patient not taking: Reported on 06/27/2019) 90 tablet 0  . cetirizine (ZYRTEC) 10 MG tablet Take 10 mg by mouth daily as needed (seasonal allergies).     . feeding supplement, ENSURE ENLIVE, (ENSURE ENLIVE) LIQD Take 237 mLs by mouth 2 (two) times daily between meals. 237 mL 12  . fluticasone (FLONASE) 50 MCG/ACT nasal spray Place 2 sprays into both nostrils daily as needed (seasonal allergies).     [name] glucose blood test strip Check blood sugar twice per day. E.11.9 100 each 12  . tamsulosin (FLOMAX) 0.4 MG CAPS capsule Take 0.4 mg by mouth daily.     No current facility-administered medications for this visit.     REVIEW OF SYSTEMS:   A 10+ POINT REVIEW OF SYSTEMS WAS OBTAINED including neurology, dermatology, psychiatry, cardiac, respiratory, lymph, extremities, GI, GU, Musculoskeletal, constitutional, breasts, reproductive, HEENT.  All pertinent positives are noted in the HPI.  All others are negative.    PHYSICAL EXAMINATION: ECOG FS:2 - Symptomatic, <50% confined to bed  Vitals:   07/08/19 1012  BP: (!) 141/78  Pulse: 65  Resp: 18  Temp: 98.9 F (37.2 C)  SpO2: 100%   Wt Readings from Last 3 Encounters:   07/08/19 213 lb 12.8 oz (97 kg)  07/02/19 206 lb 12.7 oz (93.8 kg)  06/23/19 211 lb 4.8 oz (95.8 kg)   Body mass index is 33.49 kg/m.    GENERAL:alert, in no acute distress and comfortable SKIN: no acute rashes, no significant lesions EYES: conjunctiva are pink and non-injected, sclera anicteric OROPHARYNX: MMM, no exudates, no oropharyngeal erythema or ulceration NECK: supple, no JVD LYMPH:  no palpable lymphadenopathy in the cervical, axillary or inguinal regions LUNGS: clear to auscultation b/l with normal respiratory effort HEART: regular rate & rhythm ABDOMEN:  normoactive bowel sounds , non tender, not distended. Extremity: no pedal edema PSYCH: alert & oriented x 3 with fluent speech NEURO: no focal motor/sensory deficits   LABORATORY DATA:  I have reviewed the data as listed  . CBC Latest Ref Rng & Units 07/08/2019 07/02/2019 07/01/2019  WBC 4.0 - 10.5 K/uL 4.7 5.2 -  Hemoglobin 13.0 - 17.0 g/dL 7.8(L) 8.6(L) 8.8(L)  Hematocrit 39.0 - 52.0 % 23.5(L) 25.7(L) 26.0(L)  Platelets 150 - 400 K/uL 179 153 -    . CMP Latest Ref Rng & Units 07/08/2019 07/02/2019 07/01/2019  Glucose 70 - 99 mg/dL 92 88 112(H)  BUN 8 - 23 mg/dL 63(H) 68(H) 77(H)  Creatinine 0.61 - 1.24 mg/dL 5.82(HH) 5.29(H) 5.40(H)  Sodium 135 - 145 mmol/L 140 141 140  Potassium 3.5 - 5.1 mmol/L 4.0 3.9 3.7  Chloride 98 - 111 mmol/L 109 113(H) 104  CO2 22 - 32 mmol/L 19(L) 18(L) -  Calcium 8.9 - 10.3 mg/dL 8.7(L) 9.1 -  Total Protein 6.5 - 8.1 g/dL 6.3(L) - -  Total Bilirubin 0.3 - 1.2 mg/dL 0.4 - -  Alkaline Phos 38 - 126 U/L 112 - -  AST 15 - 41  U/L 32 - -  ALT 0 - 44 U/L 39 - -   RADIOGRAPHIC STUDIES: I have personally reviewed the radiological images as listed and agreed with the findings in the report. US Renal  Result Date: 06/26/2019 CLINICAL DATA:  Acute kidney injury. EXAM: RENAL / URINARY TRACT ULTRASOUND COMPLETE COMPARISON:  06/04/2019 FINDINGS: Right Kidney: Renal measurements: 10.9 x  4.9 x 6.1 cm. = volume: 170 mL. Increased cortical echogenicity. No hydronephrosis. Multiple right kidney cysts. The dominant cyst is complex with solid and cystic components arising from the lower pole measuring 3.8 x 4.0 x 4.3 cm. Left Kidney: Renal measurements: 12.0 x 6.3 x 6.4 cm. = volume: 249.3 mL. Increased echogenicity. No hydronephrosis. Multiple lesions within the left kidney are again identified. The dominant lesion appears solid and arises from the upper pole of the left kidney measuring 7.6 x 5.5 x 6.5 cm. Other lesions have imaging features of simple cysts. Bladder: There is marked enlargement of the prostate gland which has mass effect upon the bladder. Other: None IMPRESSION: Bilateral increased renal cortical echogenicity compatible with chronic medical renal disease. No hydronephrosis. Multiple bilateral simple appearing and complex kidney lesions. Solid and cystic lesion within the inferior pole of the right kidney is noted measuring 4.3 cm. Cystic renal cell carcinoma cannot be excluded. A solid-appearing lesion arises from the upper pole of the left kidney measuring 7.6 cm. As mentioned on previous imaging findings are concerning for renal cell carcinoma. Prostate gland enlargement. Electronically Signed   By: Kerby Moors M.D.   On: 06/26/2019 04:38   Ir Fluoro Guide Cv Line Right  Result Date: 06/28/2019 CLINICAL DATA:  Renal failure, needs tunneled pheresis catheter, with possible future use for hemodialysis. EXAM: TUNNELED PHERESIS/HEMODIALYSIS CATHETER PLACEMENT WITH ULTRASOUND AND FLUOROSCOPIC GUIDANCE TECHNIQUE: The procedure, risks, benefits, and alternatives were explained to the patient. Questions regarding the procedure were encouraged and answered. The patient understands and consents to the procedure. As antibiotic prophylaxis, vancomycin 1 g was ordered pre-procedure and administered intravenously within one hour of incision.Patency of the right IJ vein was confirmed with  ultrasound with image documentation. An appropriate skin site was determined. Region was prepped using maximum barrier technique including cap and mask, sterile gown, sterile gloves, large sterile sheet, and Chlorhexidine as cutaneous antisepsis. The region was infiltrated locally with 1% lidocaine. Intravenous Fentanyl 39mg and Versed '1mg'$  were administered as conscious sedation during continuous monitoring of the patient's level of consciousness and physiological / cardiorespiratory status by the radiology RN, with a total moderate sedation time of 10 minutes. Under real-time ultrasound guidance, the right IJ vein was accessed with a 21 gauge micropuncture needle; the needle tip within the vein was confirmed with ultrasound image documentation. Needle exchanged over the 018 guidewire for transitional dilator, which allowed advancement of a Benson wire into the IVC. Over this, an MPA catheter was advanced. A Palindrome 23 hemodialysis catheter was tunneled from the right anterior chest wall approach to the right IJ dermatotomy site. The MPA catheter was exchanged over an Amplatz wire for serial vascular dilators which allow placement of a peel-away sheath, through which the catheter was advanced under intermittent fluoroscopy, positioned with its tips in the proximal and midright atrium. Spot chest radiograph confirms good catheter position. No pneumothorax. Catheter was flushed and primed per protocol. Catheter secured externally with O Prolene sutures. The right IJ dermatotomy site was closed with Dermabond. COMPLICATIONS: COMPLICATIONS None immediate FLUOROSCOPY TIME:  0.9 minute; 118  uGym2 DAP COMPARISON:  None IMPRESSION:  1. Technically successful placement of tunneled right IJ hemodialysis/pheresis catheter with ultrasound and fluoroscopic guidance. Ready for routine use. ACCESS: Remains approachable for percutaneous intervention as needed. Electronically Signed   By: Lucrezia Europe M.D.   On: 06/28/2019 17:06    Ir US Guide Vasc Access Right  Result Date: 06/28/2019 CLINICAL DATA:  Renal failure, needs tunneled pheresis catheter, with possible future use for hemodialysis. EXAM: TUNNELED PHERESIS/HEMODIALYSIS CATHETER PLACEMENT WITH ULTRASOUND AND FLUOROSCOPIC GUIDANCE TECHNIQUE: The procedure, risks, benefits, and alternatives were explained to the patient. Questions regarding the procedure were encouraged and answered. The patient understands and consents to the procedure. As antibiotic prophylaxis, vancomycin 1 g was ordered pre-procedure and administered intravenously within one hour of incision.Patency of the right IJ vein was confirmed with ultrasound with image documentation. An appropriate skin site was determined. Region was prepped using maximum barrier technique including cap and mask, sterile gown, sterile gloves, large sterile sheet, and Chlorhexidine as cutaneous antisepsis. The region was infiltrated locally with 1% lidocaine. Intravenous Fentanyl 35mg and Versed 176mwere administered as conscious sedation during continuous monitoring of the patient's level of consciousness and physiological / cardiorespiratory status by the radiology RN, with a total moderate sedation time of 10 minutes. Under real-time ultrasound guidance, the right IJ vein was accessed with a 21 gauge micropuncture needle; the needle tip within the vein was confirmed with ultrasound image documentation. Needle exchanged over the 018 guidewire for transitional dilator, which allowed advancement of a Benson wire into the IVC. Over this, an MPA catheter was advanced. A Palindrome 23 hemodialysis catheter was tunneled from the right anterior chest wall approach to the right IJ dermatotomy site. The MPA catheter was exchanged over an Amplatz wire for serial vascular dilators which allow placement of a peel-away sheath, through which the catheter was advanced under intermittent fluoroscopy, positioned with its tips in the proximal and  midright atrium. Spot chest radiograph confirms good catheter position. No pneumothorax. Catheter was flushed and primed per protocol. Catheter secured externally with O Prolene sutures. The right IJ dermatotomy site was closed with Dermabond. COMPLICATIONS: COMPLICATIONS None immediate FLUOROSCOPY TIME:  0.9 minute; 118  uGym2 DAP COMPARISON:  None IMPRESSION: 1. Technically successful placement of tunneled right IJ hemodialysis/pheresis catheter with ultrasound and fluoroscopic guidance. Ready for routine use. ACCESS: Remains approachable for percutaneous intervention as needed. Electronically Signed   By: D Lucrezia Europe.D.   On: 06/28/2019 17:06   Vas UsKoreapper Ext Vein Mapping (pre-op Avf)  Result Date: 07/01/2019 UPPER EXTREMITY VEIN MAPPING  Indications: Pre-access. Comparison Study: No prior study Performing Technologist: MiMaudry MayhewHA, RDMS, RVT, RDCS  Examination Guidelines: A complete evaluation includes B-mode imaging, spectral Doppler, color Doppler, and power Doppler as needed of all accessible portions of each vessel. Bilateral testing is considered an integral part of a complete examination. Limited examinations for reoccurring indications may be performed as noted. +-----------------+-------------+----------+--------------+ Right Cephalic   Diameter (cm)Depth (cm)   Findings    +-----------------+-------------+----------+--------------+ Shoulder             0.17        1.35                  +-----------------+-------------+----------+--------------+ Prox upper arm       0.19        0.56                  +-----------------+-------------+----------+--------------+ Mid upper arm        0.13  0.23                  +-----------------+-------------+----------+--------------+ Dist upper arm       0.10        0.28                  +-----------------+-------------+----------+--------------+ Antecubital fossa    0.28        0.33                   +-----------------+-------------+----------+--------------+ Prox forearm         0.16        0.45                  +-----------------+-------------+----------+--------------+ Mid forearm          0.12        0.43                  +-----------------+-------------+----------+--------------+ Dist forearm         0.13        0.36                  +-----------------+-------------+----------+--------------+ Wrist                                   not visualized +-----------------+-------------+----------+--------------+ +-----------------+-------------+----------+--------------+ Right Basilic    Diameter (cm)Depth (cm)   Findings    +-----------------+-------------+----------+--------------+ Prox upper arm                          not visualized +-----------------+-------------+----------+--------------+ Mid upper arm                           not visualized +-----------------+-------------+----------+--------------+ Dist upper arm       0.38                              +-----------------+-------------+----------+--------------+ Antecubital fossa    0.24                 branching    +-----------------+-------------+----------+--------------+ Prox forearm                            not visualized +-----------------+-------------+----------+--------------+ Mid forearm                             not visualized +-----------------+-------------+----------+--------------+ Distal forearm                          not visualized +-----------------+-------------+----------+--------------+ Wrist                                   not visualized +-----------------+-------------+----------+--------------+ +-----------------+-------------+----------+--------------+ Left Cephalic    Diameter (cm)Depth (cm)   Findings    +-----------------+-------------+----------+--------------+ Shoulder             0.08        0.25                   +-----------------+-------------+----------+--------------+ Prox upper arm       0.08        0.22                  +-----------------+-------------+----------+--------------+  Mid upper arm        0.10        0.26                  +-----------------+-------------+----------+--------------+ Dist upper arm       0.11        0.22                  +-----------------+-------------+----------+--------------+ Antecubital fossa    0.21        0.22                  +-----------------+-------------+----------+--------------+ Prox forearm         0.15        0.31                  +-----------------+-------------+----------+--------------+ Mid forearm          0.09        0.18                  +-----------------+-------------+----------+--------------+ Dist forearm                            not visualized +-----------------+-------------+----------+--------------+ Wrist                                   not visualized +-----------------+-------------+----------+--------------+ +-----------------+-------------+----------+--------------+ Left Basilic     Diameter (cm)Depth (cm)   Findings    +-----------------+-------------+----------+--------------+ Prox upper arm       0.46                              +-----------------+-------------+----------+--------------+ Mid upper arm        0.27                              +-----------------+-------------+----------+--------------+ Dist upper arm       0.32                              +-----------------+-------------+----------+--------------+ Antecubital fossa    0.27                              +-----------------+-------------+----------+--------------+ Prox forearm         0.16                              +-----------------+-------------+----------+--------------+ Mid forearm                             not visualized +-----------------+-------------+----------+--------------+ Distal forearm                           not visualized +-----------------+-------------+----------+--------------+ Wrist                                   not visualized +-----------------+-------------+----------+--------------+ *See table(s) above for measurements and observations.  Diagnosing physician: Servando Snare MD Electronically signed by Servando Snare MD on 07/01/2019 at 4:27:38 PM.    Final     ASSESSMENT &  PLAN:   1) Progressive Kappa light chain Multiple Myeloma -- with likely progression since patient abandoned treatment earlier this year. 2) Worsening renal failure -- likely myeloma kidney 3) Anemia due to CKD, myeloma , RCC  4) RCC - 7.6 cms in upper pole of rt kidney.  PLAN: -Discussed pt labwork today, 07/08/19;  CBC w/diff and CMP is as follows: all values are WNL except for RBC at 2.42, Hemoglobin at 7.8, HCT at 23.5, CO2 at 19, BUN at 63, Creatinine at 5.82, Calcium at 8.7, Total protein at 6.3, GFR Est Non Af Am at 9, GFR Est AFR Am at 10. -Discussed blood transfusion for his anemia from CKD and Myeloma and with additional concerns for worsening anemia with rx.  -Discussed that blood counts show that he is slightly anemic and that the clinic will set up a transfusion -Recommended following up with urinologist   -Advised that kidney function at a point of nearly needing HD and that he needs to follow up with Nephrologists (Dr.Webb) closely regarding HD consideration. -discussed need for compliance with medical treatments and f/u. I still doubt patient will be able to maintain appropriate f/u and will likely have similar issues as last time with non compliance.  FOLLOW UP: Please schedule PRBC transfusion x 1 unit in 1-2 days Please schedule next 2 dose of weekly treatment as per orders Patient advised to f/u with his Nephrologist at [facility] ASAP   The total time spent in the appt was 25 minutes and more than 50% was on counseling and direct patient cares.  All of the patient's  questions were answered with apparent satisfaction. The patient knows to call the clinic with any problems, questions or concerns.     Sullivan Lone MD MS AAHIVMS [facility] Hematology/Oncology Physician [facility]  (Office):       [phone] (Work cell):  [phone] (Fax):           [phone]  07/08/2019 4:22 PM  I, Scot Dock, am acting as a scribe for Dr. Sullivan Lone.   .I have reviewed the above documentation for accuracy and completeness, and I agree with the above. Brunetta Genera MD

## 2019-07-08 ENCOUNTER — Inpatient Hospital Stay: Payer: Medicare Other

## 2019-07-08 ENCOUNTER — Other Ambulatory Visit: Payer: Self-pay

## 2019-07-08 ENCOUNTER — Other Ambulatory Visit: Payer: Self-pay | Admitting: *Deleted

## 2019-07-08 ENCOUNTER — Inpatient Hospital Stay (HOSPITAL_BASED_OUTPATIENT_CLINIC_OR_DEPARTMENT_OTHER): Payer: Medicare Other | Admitting: Hematology

## 2019-07-08 ENCOUNTER — Inpatient Hospital Stay: Payer: Medicare Other | Attending: Hematology

## 2019-07-08 ENCOUNTER — Telehealth: Payer: Self-pay | Admitting: Hematology

## 2019-07-08 ENCOUNTER — Telehealth: Payer: Self-pay | Admitting: *Deleted

## 2019-07-08 VITALS — BP 141/78 | HR 65 | Temp 98.9°F | Resp 18 | Ht 67.0 in | Wt 213.8 lb

## 2019-07-08 DIAGNOSIS — C9000 Multiple myeloma not having achieved remission: Secondary | ICD-10-CM

## 2019-07-08 DIAGNOSIS — Z79899 Other long term (current) drug therapy: Secondary | ICD-10-CM | POA: Insufficient documentation

## 2019-07-08 DIAGNOSIS — J449 Chronic obstructive pulmonary disease, unspecified: Secondary | ICD-10-CM | POA: Insufficient documentation

## 2019-07-08 DIAGNOSIS — I480 Paroxysmal atrial fibrillation: Secondary | ICD-10-CM | POA: Diagnosis not present

## 2019-07-08 DIAGNOSIS — N183 Chronic kidney disease, stage 3 unspecified: Secondary | ICD-10-CM | POA: Diagnosis not present

## 2019-07-08 DIAGNOSIS — I130 Hypertensive heart and chronic kidney disease with heart failure and stage 1 through stage 4 chronic kidney disease, or unspecified chronic kidney disease: Secondary | ICD-10-CM | POA: Insufficient documentation

## 2019-07-08 DIAGNOSIS — Z7189 Other specified counseling: Secondary | ICD-10-CM

## 2019-07-08 DIAGNOSIS — E785 Hyperlipidemia, unspecified: Secondary | ICD-10-CM | POA: Diagnosis not present

## 2019-07-08 DIAGNOSIS — C649 Malignant neoplasm of unspecified kidney, except renal pelvis: Secondary | ICD-10-CM | POA: Diagnosis not present

## 2019-07-08 DIAGNOSIS — Z5111 Encounter for antineoplastic chemotherapy: Secondary | ICD-10-CM | POA: Insufficient documentation

## 2019-07-08 DIAGNOSIS — E1122 Type 2 diabetes mellitus with diabetic chronic kidney disease: Secondary | ICD-10-CM | POA: Diagnosis not present

## 2019-07-08 DIAGNOSIS — C641 Malignant neoplasm of right kidney, except renal pelvis: Secondary | ICD-10-CM | POA: Insufficient documentation

## 2019-07-08 DIAGNOSIS — D631 Anemia in chronic kidney disease: Secondary | ICD-10-CM | POA: Diagnosis not present

## 2019-07-08 DIAGNOSIS — I252 Old myocardial infarction: Secondary | ICD-10-CM | POA: Insufficient documentation

## 2019-07-08 DIAGNOSIS — Z87891 Personal history of nicotine dependence: Secondary | ICD-10-CM | POA: Diagnosis not present

## 2019-07-08 DIAGNOSIS — Z85038 Personal history of other malignant neoplasm of large intestine: Secondary | ICD-10-CM | POA: Insufficient documentation

## 2019-07-08 DIAGNOSIS — I2510 Atherosclerotic heart disease of native coronary artery without angina pectoris: Secondary | ICD-10-CM | POA: Diagnosis not present

## 2019-07-08 LAB — CMP (CANCER CENTER ONLY)
ALT: 39 U/L (ref 0–44)
AST: 32 U/L (ref 15–41)
Albumin: 4.2 g/dL (ref 3.5–5.0)
Alkaline Phosphatase: 112 U/L (ref 38–126)
Anion gap: 12 (ref 5–15)
BUN: 63 mg/dL — ABNORMAL HIGH (ref 8–23)
CO2: 19 mmol/L — ABNORMAL LOW (ref 22–32)
Calcium: 8.7 mg/dL — ABNORMAL LOW (ref 8.9–10.3)
Chloride: 109 mmol/L (ref 98–111)
Creatinine: 5.82 mg/dL (ref 0.61–1.24)
GFR, Est AFR Am: 10 mL/min — ABNORMAL LOW (ref >60)
GFR, Estimated: 9 mL/min — ABNORMAL LOW (ref >60)
Glucose, Bld: 92 mg/dL (ref 70–99)
Potassium: 4 mmol/L (ref 3.5–5.1)
Sodium: 140 mmol/L (ref 135–145)
Total Bilirubin: 0.4 mg/dL (ref 0.3–1.2)
Total Protein: 6.3 g/dL — ABNORMAL LOW (ref 6.5–8.1)

## 2019-07-08 LAB — CBC WITH DIFFERENTIAL/PLATELET
Abs Immature Granulocytes: 0.01 10*3/uL (ref 0.00–0.07)
Basophils Absolute: 0 10*3/uL (ref 0.0–0.1)
Basophils Relative: 0 %
Eosinophils Absolute: 0.1 10*3/uL (ref 0.0–0.5)
Eosinophils Relative: 2 %
HCT: 23.5 % — ABNORMAL LOW (ref 39.0–52.0)
Hemoglobin: 7.8 g/dL — ABNORMAL LOW (ref 13.0–17.0)
Immature Granulocytes: 0 %
Lymphocytes Relative: 23 %
Lymphs Abs: 1.1 10*3/uL (ref 0.7–4.0)
MCH: 32.2 pg (ref 26.0–34.0)
MCHC: 33.2 g/dL (ref 30.0–36.0)
MCV: 97.1 fL (ref 80.0–100.0)
Monocytes Absolute: 0.5 10*3/uL (ref 0.1–1.0)
Monocytes Relative: 11 %
Neutro Abs: 2.9 10*3/uL (ref 1.7–7.7)
Neutrophils Relative %: 64 %
Platelets: 179 10*3/uL (ref 150–400)
RBC: 2.42 MIL/uL — ABNORMAL LOW (ref 4.22–5.81)
RDW: 12.8 % (ref 11.5–15.5)
WBC: 4.7 10*3/uL (ref 4.0–10.5)
nRBC: 0 % (ref 0.0–0.2)

## 2019-07-08 MED ORDER — SODIUM CHLORIDE 0.9 % IV SOLN
Freq: Once | INTRAVENOUS | Status: AC
  Administered 2019-07-08: 12:00:00 via INTRAVENOUS
  Filled 2019-07-08: qty 250

## 2019-07-08 MED ORDER — PROCHLORPERAZINE MALEATE 10 MG PO TABS
ORAL_TABLET | ORAL | Status: AC
  Filled 2019-07-08: qty 1

## 2019-07-08 MED ORDER — SODIUM CHLORIDE 0.9 % IV SOLN
200.0000 mg/m2 | Freq: Once | INTRAVENOUS | Status: AC
  Administered 2019-07-08: 440 mg via INTRAVENOUS
  Filled 2019-07-08: qty 22

## 2019-07-08 MED ORDER — PROCHLORPERAZINE MALEATE 10 MG PO TABS
10.0000 mg | ORAL_TABLET | Freq: Once | ORAL | Status: AC
  Administered 2019-07-08: 10 mg via ORAL

## 2019-07-08 MED ORDER — ACETAMINOPHEN 325 MG PO TABS
ORAL_TABLET | ORAL | Status: AC
  Filled 2019-07-08: qty 2

## 2019-07-08 MED ORDER — BORTEZOMIB CHEMO SQ INJECTION 3.5 MG (2.5MG/ML)
1.5000 mg/m2 | Freq: Once | INTRAMUSCULAR | Status: AC
  Administered 2019-07-08: 3.25 mg via SUBCUTANEOUS
  Filled 2019-07-08: qty 1.3

## 2019-07-08 MED ORDER — ACETAMINOPHEN 325 MG PO TABS
650.0000 mg | ORAL_TABLET | Freq: Once | ORAL | Status: AC
  Administered 2019-07-08: 650 mg via ORAL

## 2019-07-08 NOTE — Telephone Encounter (Signed)
Scheduled appt per 11/5 los.  Called the infusion area and they will inform the pt of his scheduled appt for 11/6.

## 2019-07-08 NOTE — Progress Notes (Signed)
Per Dr.Kale direction - contacted [facility] on patient's behalf to explore community support

## 2019-07-08 NOTE — Telephone Encounter (Signed)
CRITICAL VALUE: Cr 5.82 DATE & TIME NOTIFIED: 07/08/2019, 10:45AM MESSENGER (representative from lab): Lelan Pons MD NOTIFIED: Dr.Kale TIME OF NOTIFICATION: 11:09AM RESPONSE: Acknowldeged

## 2019-07-08 NOTE — Patient Instructions (Signed)
   [facility] Discharge Instructions for Patients Receiving Chemotherapy  Today you received the following chemotherapy agents Velcade, Cytoxan  To help prevent nausea and vomiting after your treatment, we encourage you to take your nausea medication as prescribed   If you develop nausea and vomiting that is not controlled by your nausea medication, call the clinic.   BELOW ARE SYMPTOMS THAT SHOULD BE REPORTED IMMEDIATELY:  *FEVER GREATER THAN 100.5 F  *CHILLS WITH OR WITHOUT FEVER  NAUSEA AND VOMITING THAT IS NOT CONTROLLED WITH YOUR NAUSEA MEDICATION  *UNUSUAL SHORTNESS OF BREATH  *UNUSUAL BRUISING OR BLEEDING  TENDERNESS IN MOUTH AND THROAT WITH OR WITHOUT PRESENCE OF ULCERS  *URINARY PROBLEMS  *BOWEL PROBLEMS  UNUSUAL RASH Items with * indicate a potential emergency and should be followed up as soon as possible.  Feel free to call the clinic should you have any questions or concerns. The clinic phone number is [phone].  Please show the [place] at check-in to the Emergency Department and triage nurse.

## 2019-07-08 NOTE — Progress Notes (Signed)
Verbal order per Dr.Kale - OK to treat today with Cr. 5.82

## 2019-07-09 ENCOUNTER — Encounter: Payer: Self-pay | Admitting: General Practice

## 2019-07-09 ENCOUNTER — Other Ambulatory Visit: Payer: Self-pay

## 2019-07-09 ENCOUNTER — Inpatient Hospital Stay: Payer: Medicare Other

## 2019-07-09 ENCOUNTER — Other Ambulatory Visit: Payer: Self-pay | Admitting: Hematology

## 2019-07-09 DIAGNOSIS — C9000 Multiple myeloma not having achieved remission: Secondary | ICD-10-CM | POA: Diagnosis not present

## 2019-07-09 LAB — PREPARE RBC (CROSSMATCH)

## 2019-07-09 MED ORDER — ACETAMINOPHEN 325 MG PO TABS
650.0000 mg | ORAL_TABLET | Freq: Once | ORAL | Status: AC
  Administered 2019-07-09: 650 mg via ORAL

## 2019-07-09 MED ORDER — ACETAMINOPHEN 325 MG PO TABS
ORAL_TABLET | ORAL | Status: AC
  Filled 2019-07-09: qty 2

## 2019-07-09 MED ORDER — SODIUM CHLORIDE 0.9% IV SOLUTION
250.0000 mL | Freq: Once | INTRAVENOUS | Status: AC
  Administered 2019-07-09: 250 mL via INTRAVENOUS
  Filled 2019-07-09: qty 250

## 2019-07-09 MED ORDER — ACYCLOVIR 200 MG PO CAPS: 200.0000 mg | ORAL_CAPSULE | Freq: Two times a day (BID) | ORAL | 0 refills | Status: DC

## 2019-07-09 NOTE — Patient Instructions (Signed)
Blood Transfusion, Adult, Care After This sheet gives you information about how to care for yourself after your procedure. Your doctor may also give you more specific instructions. If you have problems or questions, contact your doctor. Follow these instructions at home:   Take over-the-counter and prescription medicines only as told by your doctor.  Go back to your normal activities as told by your doctor.  Follow instructions from your doctor about how to take care of the area where an IV tube was put into your vein (insertion site). Make sure you: ? Wash your hands with soap and water before you change your bandage (dressing). If there is no soap and water, use hand sanitizer. ? Change your bandage as told by your doctor.  Check your IV insertion site every day for signs of infection. Check for: ? More redness, swelling, or pain. ? More fluid or blood. ? Warmth. ? Pus or a bad smell. Contact a doctor if:  You have more redness, swelling, or pain around the IV insertion site.  You have more fluid or blood coming from the IV insertion site.  Your IV insertion site feels warm to the touch.  You have pus or a bad smell coming from the IV insertion site.  Your pee (urine) turns pink, red, or brown.  You feel weak after doing your normal activities. Get help right away if:  You have signs of a serious allergic or body defense (immune) system reaction, including: ? Itchiness. ? Hives. ? Trouble breathing. ? Anxiety. ? Pain in your chest or lower back. ? Fever, flushing, and chills. ? Fast pulse. ? Rash. ? Watery poop (diarrhea). ? Throwing up (vomiting). ? Dark pee. ? Serious headache. ? Dizziness. ? Stiff neck. ? Yellow color in your face or the white parts of your eyes (jaundice). Summary  After a blood transfusion, return to your normal activities as told by your doctor.  Every day, check for signs of infection where the IV tube was put into your vein.  Some  signs of infection are warm skin, more redness and pain, more fluid or blood, and pus or a bad smell where the needle went in.  Contact your doctor if you feel weak or have any unusual symptoms. This information is not intended to replace advice given to you by your health care provider. Make sure you discuss any questions you have with your health care provider. Document Released: 09/09/2014 Document Revised: 12/24/2017 Document Reviewed: 04/12/2016 Elsevier Patient Education  2020 [employer]  

## 2019-07-09 NOTE — Progress Notes (Signed)
[facility] Initial Psychosocial Assessment Clinical Social Work  Clinical Social Work contacted by phone to assess psychosocial, emotional, mental health, and spiritual needs of the patient.   Barriers to care/review of distress screen:  - Transportation:  Do you anticipate any problems getting to appointments?  Do you have someone who can help run errands for you if you need it?  Needs to get set up for transportation, uses friend's car but cannot depend on that.  U sed [facility] Transportation in the past - Help at home:  What is your living situation (alone, family, other)?  If you are physically unable to care for yourself, who would you call on to help you?  Eldest son "keeps up with my appointments and such."  Depends on eldest son, will further assess.   - Support system:  What does your support system look like?  Who would you call on if you needed some kind of practical help?  What if you needed someone to talk to for emotional support?  Depends on eldest son, will further assess. - Finances:  Are you concerned about finances. Considering returning to work?  If not, applying for disability? Has Medicaid and Medicare - can use Gets Food Stamps, "thank God for them."  Has little money left over after he pays his rent and utilities and household expenses.   What is your understanding of where you are with your cancer? Its cause?  Your treatment plan and what happens next? Diagnosed w multiple myeloma and renal cell carcinoma, stopped treatments in the past because he felt well/treatments were making him feel "worse."  Went to his cardiologist, indicated he was willing to restart treatment.  Recent hospitalization, first chemo treatment yesterday, today says he feels well -  only complaint is that "I am peeing a lot, I am in the bathroom a lot"  No diarrhea "but I haven't had much to eat today."  Providers have had difficulty reaching him to communicate re appointments and similar.  Per patient "I dont text,  best place to reach me is my home phone."  Mobile number is indicated as best in Epic.  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  Will meet w patient next week while in infusion.     Anne Cunningham, LCSW Clinical Social Worker Phone:  [phone] Cell:  [phone]  

## 2019-07-09 NOTE — Progress Notes (Signed)
[place] CSW Progress Notes   Spoke w son, Davian Hanshaw, to discuss father's needs at home.  Per son, patient is significantly fatigued post chemotherapy.  Son was concerned enough to go to patient's home to determine his wellness. Son lives 20 minutes away and also works.  Per son, patient's wife (deceased in 29) was the one who made sure patient got to appointments, was taking care of his health and similar.  After her death, patient has been less able to manage his healthcare.  "He tends to forget things and he is hard of hearing - you have to shout for him to hear you."  Does not have a hearing aid.  Son requests to be notified of appointments, is willing to assist as needed but often does not know what father needs to do. Will remind father the day before if he knows time/date of appointments. Advised that patient can allow son to access his 9, son will pursue this idea  Per son, patient may benefit from service like [place] to provide structure at home.  Would like someone to come regularly to see if he is taking his medications correctly.  Would benefit from Meals on Wheels because he is not eating well.  Needs referral for [employer] transportation as he does not have a car and drives neighbors car when it is available.  CSW will advise MD and RN of need for possible [facility] RN referral.  Will discuss Medicaid [place], [employer] and Meals on Wheels w patient during visit in infusion next week.  Have already requested patient be served by [employer].  Request under consideration.  Edwyna Shell, LCSW Clinical Social Worker Phone:  [phone] Cell:  [phone]

## 2019-07-10 LAB — BPAM RBC
Blood Product Expiration Date: 202012022359
Blood Product Expiration Date: 202012052359
ISSUE DATE / TIME: 202011060821
ISSUE DATE / TIME: 202011060905
Unit Type and Rh: 6200
Unit Type and Rh: 6200

## 2019-07-10 LAB — TYPE AND SCREEN
ABO/RH(D): A POS
Antibody Screen: NEGATIVE
Unit division: 0
Unit division: 0

## 2019-07-13 ENCOUNTER — Encounter (HOSPITAL_COMMUNITY): Payer: Self-pay | Admitting: General Practice

## 2019-07-13 NOTE — Progress Notes (Signed)
Ag[employer] Progress Notes  Email received from [employer], patient did not want to schedule rides as he states he does not want treatment as "it made him horribly sick last time, will leave in God's hands."  Edwyna Shell, [place] Worker Phone:  [phone]

## 2019-07-15 ENCOUNTER — Inpatient Hospital Stay: Payer: Medicare Other | Admitting: General Practice

## 2019-07-15 ENCOUNTER — Inpatient Hospital Stay: Payer: Medicare Other

## 2019-07-15 ENCOUNTER — Other Ambulatory Visit: Payer: Self-pay

## 2019-07-15 VITALS — BP 119/78 | HR 62 | Temp 98.0°F | Resp 18

## 2019-07-15 DIAGNOSIS — C9000 Multiple myeloma not having achieved remission: Secondary | ICD-10-CM

## 2019-07-15 DIAGNOSIS — Z7189 Other specified counseling: Secondary | ICD-10-CM

## 2019-07-15 LAB — CBC WITH DIFFERENTIAL/PLATELET
Abs Immature Granulocytes: 0.02 10*3/uL (ref 0.00–0.07)
Basophils Absolute: 0 10*3/uL (ref 0.0–0.1)
Basophils Relative: 1 %
Eosinophils Absolute: 0.1 10*3/uL (ref 0.0–0.5)
Eosinophils Relative: 2 %
HCT: 26.4 % — ABNORMAL LOW (ref 39.0–52.0)
Hemoglobin: 8.7 g/dL — ABNORMAL LOW (ref 13.0–17.0)
Immature Granulocytes: 0 %
Lymphocytes Relative: 20 %
Lymphs Abs: 1.1 10*3/uL (ref 0.7–4.0)
MCH: 31.6 pg (ref 26.0–34.0)
MCHC: 33 g/dL (ref 30.0–36.0)
MCV: 96 fL (ref 80.0–100.0)
Monocytes Absolute: 0.5 10*3/uL (ref 0.1–1.0)
Monocytes Relative: 10 %
Neutro Abs: 3.7 10*3/uL (ref 1.7–7.7)
Neutrophils Relative %: 67 %
Platelets: 178 10*3/uL (ref 150–400)
RBC: 2.75 MIL/uL — ABNORMAL LOW (ref 4.22–5.81)
RDW: 13.4 % (ref 11.5–15.5)
WBC: 5.4 10*3/uL (ref 4.0–10.5)
nRBC: 0 % (ref 0.0–0.2)

## 2019-07-15 LAB — CMP (CANCER CENTER ONLY)
ALT: 29 U/L (ref 0–44)
AST: 28 U/L (ref 15–41)
Albumin: 4 g/dL (ref 3.5–5.0)
Alkaline Phosphatase: 124 U/L (ref 38–126)
Anion gap: 10 (ref 5–15)
BUN: 53 mg/dL — ABNORMAL HIGH (ref 8–23)
CO2: 22 mmol/L (ref 22–32)
Calcium: 8.6 mg/dL — ABNORMAL LOW (ref 8.9–10.3)
Chloride: 106 mmol/L (ref 98–111)
Creatinine: 4.69 mg/dL (ref 0.61–1.24)
GFR, Est AFR Am: 13 mL/min — ABNORMAL LOW (ref >60)
GFR, Estimated: 11 mL/min — ABNORMAL LOW (ref >60)
Glucose, Bld: 107 mg/dL — ABNORMAL HIGH (ref 70–99)
Potassium: 3.8 mmol/L (ref 3.5–5.1)
Sodium: 138 mmol/L (ref 135–145)
Total Bilirubin: 0.4 mg/dL (ref 0.3–1.2)
Total Protein: 6.5 g/dL (ref 6.5–8.1)

## 2019-07-15 MED ORDER — PROCHLORPERAZINE MALEATE 10 MG PO TABS
10.0000 mg | ORAL_TABLET | Freq: Once | ORAL | Status: AC
  Administered 2019-07-15: 10 mg via ORAL

## 2019-07-15 MED ORDER — ACETAMINOPHEN 325 MG PO TABS
ORAL_TABLET | ORAL | Status: AC
  Filled 2019-07-15: qty 2

## 2019-07-15 MED ORDER — SODIUM CHLORIDE 0.9 % IV SOLN
Freq: Once | INTRAVENOUS | Status: AC
  Administered 2019-07-15: 14:00:00 via INTRAVENOUS
  Filled 2019-07-15: qty 250

## 2019-07-15 MED ORDER — DEXAMETHASONE 4 MG PO TABS
ORAL_TABLET | ORAL | Status: AC
  Filled 2019-07-15: qty 5

## 2019-07-15 MED ORDER — ACETAMINOPHEN 325 MG PO TABS: 650.0000 mg | ORAL_TABLET | Freq: Once | ORAL | Status: DC

## 2019-07-15 MED ORDER — DEXAMETHASONE 4 MG PO TABS
20.0000 mg | ORAL_TABLET | Freq: Once | ORAL | Status: AC
  Administered 2019-07-15: 20 mg via ORAL

## 2019-07-15 MED ORDER — BORTEZOMIB CHEMO SQ INJECTION 3.5 MG (2.5MG/ML)
1.5000 mg/m2 | Freq: Once | INTRAMUSCULAR | Status: AC
  Administered 2019-07-15: 3.25 mg via SUBCUTANEOUS
  Filled 2019-07-15: qty 1.3

## 2019-07-15 MED ORDER — PROCHLORPERAZINE MALEATE 10 MG PO TABS: 10.0000 mg | ORAL_TABLET | Freq: Once | ORAL | Status: DC

## 2019-07-15 MED ORDER — DEXAMETHASONE 4 MG PO TABS
ORAL_TABLET | ORAL | Status: AC
  Filled 2019-07-15: qty 1

## 2019-07-15 MED ORDER — PROCHLORPERAZINE MALEATE 10 MG PO TABS
ORAL_TABLET | ORAL | Status: AC
  Filled 2019-07-15: qty 1

## 2019-07-15 MED ORDER — HEPARIN SOD (PORK) LOCK FLUSH 100 UNIT/ML IV SOLN
500.0000 [IU] | Freq: Once | INTRAVENOUS | Status: DC | PRN
  Filled 2019-07-15: qty 5

## 2019-07-15 MED ORDER — SODIUM CHLORIDE 0.9% FLUSH
10.0000 mL | INTRAVENOUS | Status: DC | PRN
  Filled 2019-07-15: qty 10

## 2019-07-15 MED ORDER — SODIUM CHLORIDE 0.9 % IV SOLN
200.0000 mg/m2 | Freq: Once | INTRAVENOUS | Status: AC
  Administered 2019-07-15: 440 mg via INTRAVENOUS
  Filled 2019-07-15: qty 22

## 2019-07-15 MED ORDER — ACETAMINOPHEN 325 MG PO TABS
650.0000 mg | ORAL_TABLET | Freq: Once | ORAL | Status: AC
  Administered 2019-07-15: 650 mg via ORAL

## 2019-07-15 NOTE — Progress Notes (Signed)
CRITICAL VALUE: Cr 4.69 DATE & TIME NOTIFIED: 07/15/2019, 1301 MESSENGER (representative from lab): Rutherford Nail MD NOTIFIED: Dr.Kale TIME OF NOTIFICATION: 5825 RESPONSE: Verbal order per Dr.Kale: Ok to treat today with Cr 4.69

## 2019-07-15 NOTE — Progress Notes (Signed)
[place] CSW Progress Notes  Met w patient in infusion room.  Reviewed needs at home.  Patient drove himself to treatment today, is able to drive and take care of himself when feeling well enough.  Has difficulty some days post chemotherapy.  Lives alone.  Minister at [employer] has recently gotten him enrolled in Meals on Wheels - has received boxes of frozen foods which can be microwaved.  Enjoys these meals.  Likes to [employer], irons his own shorts, pays attention to his appearance.  Tries to walk regularly, aware that exercise and good nutrition are helping him regain/maintain health.  Wants referral for [facility], is aware of this service based on experience when his wife was ill.  Will refer if MD thinks appropriate.  Reminded him of possible [place] if he needs help w driving, pt wants to drive at this point and maintain as much independence as possible.    Edwyna Shell, LCSW Clinical Social Worker Phone:  [phone] Cell:  [phone]

## 2019-07-15 NOTE — Patient Instructions (Signed)
[facility] Discharge Instructions for Patients Receiving Chemotherapy  Today you received the following chemotherapy agents: Velcade, Cytoxan   To help prevent nausea and vomiting after your treatment, we encourage you to take your nausea medication as directed.    If you develop nausea and vomiting that is not controlled by your nausea medication, call the clinic.   BELOW ARE SYMPTOMS THAT SHOULD BE REPORTED IMMEDIATELY:  *FEVER GREATER THAN 100.5 F  *CHILLS WITH OR WITHOUT FEVER  NAUSEA AND VOMITING THAT IS NOT CONTROLLED WITH YOUR NAUSEA MEDICATION  *UNUSUAL SHORTNESS OF BREATH  *UNUSUAL BRUISING OR BLEEDING  TENDERNESS IN MOUTH AND THROAT WITH OR WITHOUT PRESENCE OF ULCERS  *URINARY PROBLEMS  *BOWEL PROBLEMS  UNUSUAL RASH Items with * indicate a potential emergency and should be followed up as soon as possible.  Feel free to call the clinic should you have any questions or concerns. The clinic phone number is [phone].  Please show the CHEMO ALERT CARD at check-in to the Emergency Department and triage nurse.   

## 2019-07-19 ENCOUNTER — Encounter (HOSPITAL_COMMUNITY): Payer: Self-pay | Admitting: Cardiology

## 2019-07-20 ENCOUNTER — Encounter: Payer: Self-pay | Admitting: General Practice

## 2019-07-20 NOTE — Progress Notes (Signed)
[place] CSW Progress Notes  Application for Medicaid Personal Care Services submitted to [place] by Manya Silvas RN.  Edwyna Shell, LCSW Clinical Social Worker Phone:  [phone]

## 2019-07-21 ENCOUNTER — Telehealth (HOSPITAL_COMMUNITY): Payer: Self-pay

## 2019-07-21 NOTE — Telephone Encounter (Signed)
New message     Just an FYI. We have made several attempts to contact this patient including sending a letter to schedule or reschedule their echocardiogram. We will be removing the patient from the echo WQ.   11.16.20 mail reminder letter  - Yomara Toothman  11.9.20 @ 3:52pm unable to leave a vm on cell phone  - Millenia Waldvogel 11.3.20 @ 3:15pm lm on home vm - Cherita Hebel admit to hospital  - Aydian Dimmick

## 2019-07-21 NOTE — Progress Notes (Signed)
HEMATOLOGY/ONCOLOGY CONSULTATION NOTE  Date of Service: 07/22/19  Patient Care Team: Scot Jun, FNP as PCP - General (Family Medicine) Minus Breeding, MD as PCP - Cardiology (Cardiology)  CHIEF COMPLAINTS/PURPOSE OF CONSULTATION:  Follow up for his Renal Cell carcinoma & Myeloma  HISTORY OF PRESENTING ILLNESS:  [name] [name] is a wonderful 73 y.o. male who has 73 year old man PMH renal cell carcinoma, prostate cancer, multiple myeloma, COPD, CKD stage III PRESENTED to [facility] emergency Department for hematuria beginning after self-catheterization (BPH). Found to have acute kidney injury.   Seen in the emergency department 10/1 for left great toe pain, diagnosed with gout, found to have worsening renal function, admission recommended but patient declined at that time.  Last evening the patient self catheterized and developed acute hematuria and so came to the emergency department for further evaluation where he was found to have gross hematuria.  He denies any bladder pain.  Review of systems is unremarkable.  No COVID symptoms or known exposure.  No specific aggravating or alleviating factors.  He reports that he has follow-up with his urologist on the 16th.  He is aware of his diagnosis of myeloma and suspected kidney cancer but did not seem concerned about this.  He has not been eating very well but drinks a lot of water.  INTERVAL HISTORY:   [name] [name] is a 73 year old man who is here for a f/u of his renal cell carcinoma, and multiple myeloma. The patient's last visit with [place] was on 07/08/2019. The pt reports that he is doing well overall. He appears to be feeling much better after PRBC transfusion.  The pt reports he is tired of "thing in his chest" he is scared to shower and he is irritated about.  He will be moving with his daughter soon in [place] (?)  He is keeps saying he wants to stop treatment and rely on his faith.  He wants his tunnelled HD catheter  removed ASAP and notes that he is not interested in pursuing any further oncologic treatments.  Lab results today (07/22/19) of CBC w/diff and CMP is as follows: all values are WNL except for RBC at 2.79, Hemoglobin at 8.8, HCT at 26.9,  On review of systems, pt denies any symptoms.    MEDICAL HISTORY:  Past Medical History:  Diagnosis Date  . Abnormal CT of liver    a. nodular contour suggesting cirrhosis 05/2018.  [name] Abnormal LFTs (liver function tests)   . Anxiety   . Aortic root dilatation ([place])   . Ascending aortic aneurysm ([place])   . BPH with urinary obstruction   . Bradycardia   . 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 CAD (coronary artery disease)    a. 12/2015 NSTEMI/Cath (in setting of PAF):  LM nl, LAD 30p,  LCX 46m RCA  ok, AM 100%, RPDA1 40, RPDA 2 60 ->Med Rx.  . Childhood asthma   . Chronic diastolic CHF (congestive heart failure) ([place]    a. 12/2015 Echo: EF 50-55%, mod AI, mod Ao root dil, mild MR, mod dil LA, mod RA.  . CKD (chronic kidney disease), stage III   . Colon cancer ([place]   . COPD (chronic obstructive pulmonary disease) ([place]    pt denies at preop  . Diabetes mellitus type II    diet controlled  . History of syncope   . Hyperlipidemia   .  Hypertensive heart disease   . Kidney lump 04/04/2010   Overview:  Renal Cell Carcinoma   . Light chain myeloma ([place])   . Moderate aortic insufficiency    a. 12/2015 Echo: Mod AI.  [name] Paroxysmal atrial fibrillation ([place])    a. 12/2015 started on Xarelto (CHA2DS2VASc = 4-5).  . Pneumonia   . Prostate cancer ([place])   . Renal cell carcinoma ([place])   . Sleep apnea    Does not like  CPAP  . Spinal stenosis in cervical region 10/12/2016  . Venous insufficiency     SURGICAL HISTORY: Past Surgical History:  Procedure Laterality Date  . BACK SURGERY    . CARDIAC CATHETERIZATION N/A 12/18/2015   Procedure: Left Heart Cath and Coronary  Angiography;  Surgeon: Leonie Man, MD;  Location: [facility];  Service: Cardiovascular;  Laterality: N/A;  . DIAGNOSTIC LAPAROSCOPY     partial colectomy  . IR FLUORO GUIDE CV LINE RIGHT  06/28/2019  . IR US GUIDE VASC ACCESS RIGHT  06/28/2019  . LAPAROSCOPIC PARTIAL COLECTOMY Right 05/21/2018   Procedure: LAPAROSCOPIC RIGHT  COLECTOMY ERAS PATHWAY;  Surgeon: Leighton Ruff, MD;  Location: [facility];  Service: General;  Laterality: Right;  . [place]   "replaced a disc"    SOCIAL HISTORY: Social History   Socioeconomic History  . Marital status: Widowed    Spouse name: Not on file  . Number of children: Not on file  . Years of education: Not on file  . Highest education level: Not on file  Occupational History  . Not on file  Social Needs  . Financial resource strain: Not on file  . Food insecurity    Worry: Not on file    Inability: Not on file  . Transportation needs    Medical: Not on file    Non-medical: Not on file  Tobacco Use  . Smoking status: Former Smoker    Types: Cigars    Quit date: 09/02/1977    Years since quitting: 41.9  . Smokeless tobacco: Never Used  . Tobacco comment:    Substance and Sexual Activity  . Alcohol use: Not Currently    Comment: "stopped drinking alcohol in ~ 1980; just drank a little on the weekends when I did drink"  . Drug use: No  . Sexual activity: Not Currently  Lifestyle  . Physical activity    Days per week: Not on file    Minutes per session: Not on file  . Stress: Not on file  Relationships  . Social Herbalist on phone: Not on file    Gets together: Not on file    Attends religious service: Not on file    Active member of club or organization: Not on file    Attends meetings of clubs or organizations: Not on file    Relationship status: Not on file  . Intimate partner violence    Fear of current or ex partner: Not on file    Emotionally abused: Not on file    Physically abused: Not  on file    Forced sexual activity: Not on file  Other Topics Concern  . Not on file  Social History Narrative   Lives alone.  Wife died in 2023-03-29.  Lives in apartment.      FAMILY HISTORY: Family History  Problem Relation Age of Onset  . Emphysema Father   . Asthma Father   . Liver disease Father  tumor  . Heart disease Mother   . Hypertension Mother   . Asthma Sister   . Hypertension Son     ALLERGIES:  is allergic to amoxicillin and penicillins.  MEDICATIONS:  Current Outpatient Medications  Medication Sig Dispense Refill  . acyclovir (ZOVIRAX) 200 MG capsule Take 1 capsule (200 mg total) by mouth 2 (two) times daily. 60 capsule 0  . amLODipine (NORVASC) 10 MG tablet Take 1 tablet (10 mg total) by mouth daily. 90 tablet 0  . atorvastatin (LIPITOR) 80 MG tablet Take 1 tablet (80 mg total) by mouth daily. (Patient not taking: Reported on 06/27/2019) 90 tablet 0  . cetirizine (ZYRTEC) 10 MG tablet Take 10 mg by mouth daily as needed (seasonal allergies).     . feeding supplement, ENSURE ENLIVE, (ENSURE ENLIVE) LIQD Take 237 mLs by mouth 2 (two) times daily between meals. 237 mL 12  . fluticasone (FLONASE) 50 MCG/ACT nasal spray Place 2 sprays into both nostrils daily as needed (seasonal allergies).     [name] glucose blood test strip Check blood sugar twice per day. E.11.9 100 each 12  . tamsulosin (FLOMAX) 0.4 MG CAPS capsule Take 0.4 mg by mouth daily.     No current facility-administered medications for this visit.     REVIEW OF SYSTEMS:   A 10+ POINT REVIEW OF SYSTEMS WAS OBTAINED including neurology, dermatology, psychiatry, cardiac, respiratory, lymph, extremities, GI, GU, Musculoskeletal, constitutional, breasts, reproductive, HEENT.  All pertinent positives are noted in the HPI.  All others are negative.     PHYSICAL EXAMINATION: ECOG FS:2 - Symptomatic, <50% confined to bed  Vitals:   07/22/19 0954  BP: (!) 142/85  Pulse: 71  Resp: 18  Temp: 98 F (36.7 C)   SpO2: 100%   Wt Readings from Last 3 Encounters:  07/22/19 213 lb 8 oz (96.8 kg)  07/08/19 213 lb 12.8 oz (97 kg)  07/02/19 206 lb 12.7 oz (93.8 kg)   Body mass index is 33.44 kg/m.    GENERAL:alert, in no acute distress and comfortable SKIN: no acute rashes, no significant lesions EYES: conjunctiva are pink and non-injected, sclera anicteric OROPHARYNX: MMM, no exudates, no oropharyngeal erythema or ulceration NECK: supple, no JVD LYMPH:  no palpable lymphadenopathy in the cervical, axillary or inguinal regions LUNGS: clear to auscultation b/l with normal respiratory effort HEART: regular rate & rhythm ABDOMEN:  normoactive bowel sounds , non tender, not distended. Extremity: no pedal edema PSYCH: alert & oriented x 3 with fluent speech NEURO: no focal motor/sensory deficits   LABORATORY DATA:  I have reviewed the data as listed  . CBC Latest Ref Rng & Units 07/15/2019 07/08/2019 07/02/2019  WBC 4.0 - 10.5 K/uL 5.4 4.7 5.2  Hemoglobin 13.0 - 17.0 g/dL 8.7(L) 7.8(L) 8.6(L)  Hematocrit 39.0 - 52.0 % 26.4(L) 23.5(L) 25.7(L)  Platelets 150 - 400 K/uL 178 179 153    . CMP Latest Ref Rng & Units 07/15/2019 07/08/2019 07/02/2019  Glucose 70 - 99 mg/dL 107(H) 92 88  BUN 8 - 23 mg/dL 53(H) 63(H) 68(H)  Creatinine 0.61 - 1.24 mg/dL 4.69(HH) 5.82(HH) 5.29(H)  Sodium 135 - 145 mmol/L 138 140 141  Potassium 3.5 - 5.1 mmol/L 3.8 4.0 3.9  Chloride 98 - 111 mmol/L 106 109 113(H)  CO2 22 - 32 mmol/L 22 19(L) 18(L)  Calcium 8.9 - 10.3 mg/dL 8.6(L) 8.7(L) 9.1  Total Protein 6.5 - 8.1 g/dL 6.5 6.3(L) -  Total Bilirubin 0.3 - 1.2 mg/dL 0.4 0.4 -  Alkaline  Phos 38 - 126 U/L 124 112 -  AST 15 - 41 U/L 28 32 -  ALT 0 - 44 U/L 29 39 -   RADIOGRAPHIC STUDIES: I have personally reviewed the radiological images as listed and agreed with the findings in the report. US Renal  Result Date: 06/26/2019 CLINICAL DATA:  Acute kidney injury. EXAM: RENAL / URINARY TRACT ULTRASOUND COMPLETE  COMPARISON:  06/04/2019 FINDINGS: Right Kidney: Renal measurements: 10.9 x 4.9 x 6.1 cm. = volume: 170 mL. Increased cortical echogenicity. No hydronephrosis. Multiple right kidney cysts. The dominant cyst is complex with solid and cystic components arising from the lower pole measuring 3.8 x 4.0 x 4.3 cm. Left Kidney: Renal measurements: 12.0 x 6.3 x 6.4 cm. = volume: 249.3 mL. Increased echogenicity. No hydronephrosis. Multiple lesions within the left kidney are again identified. The dominant lesion appears solid and arises from the upper pole of the left kidney measuring 7.6 x 5.5 x 6.5 cm. Other lesions have imaging features of simple cysts. Bladder: There is marked enlargement of the prostate gland which has mass effect upon the bladder. Other: None IMPRESSION: Bilateral increased renal cortical echogenicity compatible with chronic medical renal disease. No hydronephrosis. Multiple bilateral simple appearing and complex kidney lesions. Solid and cystic lesion within the inferior pole of the right kidney is noted measuring 4.3 cm. Cystic renal cell carcinoma cannot be excluded. A solid-appearing lesion arises from the upper pole of the left kidney measuring 7.6 cm. As mentioned on previous imaging findings are concerning for renal cell carcinoma. Prostate gland enlargement. Electronically Signed   By: Kerby Moors M.D.   On: 06/26/2019 04:38   Ir Fluoro Guide Cv Line Right  Result Date: 06/28/2019 CLINICAL DATA:  Renal failure, needs tunneled pheresis catheter, with possible future use for hemodialysis. EXAM: TUNNELED PHERESIS/HEMODIALYSIS CATHETER PLACEMENT WITH ULTRASOUND AND FLUOROSCOPIC GUIDANCE TECHNIQUE: The procedure, risks, benefits, and alternatives were explained to the patient. Questions regarding the procedure were encouraged and answered. The patient understands and consents to the procedure. As antibiotic prophylaxis, vancomycin 1 g was ordered pre-procedure and administered intravenously  within one hour of incision.Patency of the right IJ vein was confirmed with ultrasound with image documentation. An appropriate skin site was determined. Region was prepped using maximum barrier technique including cap and mask, sterile gown, sterile gloves, large sterile sheet, and Chlorhexidine as cutaneous antisepsis. The region was infiltrated locally with 1% lidocaine. Intravenous Fentanyl 56mg and Versed 129mwere administered as conscious sedation during continuous monitoring of the patient's level of consciousness and physiological / cardiorespiratory status by the radiology RN, with a total moderate sedation time of 10 minutes. Under real-time ultrasound guidance, the right IJ vein was accessed with a 21 gauge micropuncture needle; the needle tip within the vein was confirmed with ultrasound image documentation. Needle exchanged over the 018 guidewire for transitional dilator, which allowed advancement of a Benson wire into the IVC. Over this, an MPA catheter was advanced. A Palindrome 23 hemodialysis catheter was tunneled from the right anterior chest wall approach to the right IJ dermatotomy site. The MPA catheter was exchanged over an Amplatz wire for serial vascular dilators which allow placement of a peel-away sheath, through which the catheter was advanced under intermittent fluoroscopy, positioned with its tips in the proximal and midright atrium. Spot chest radiograph confirms good catheter position. No pneumothorax. Catheter was flushed and primed per protocol. Catheter secured externally with O Prolene sutures. The right IJ dermatotomy site was closed with Dermabond. COMPLICATIONS: COMPLICATIONS None immediate  FLUOROSCOPY TIME:  0.9 minute; 118  uGym2 DAP COMPARISON:  None IMPRESSION: 1. Technically successful placement of tunneled right IJ hemodialysis/pheresis catheter with ultrasound and fluoroscopic guidance. Ready for routine use. ACCESS: Remains approachable for percutaneous intervention as  needed. Electronically Signed   By: Lucrezia Europe M.D.   On: 06/28/2019 17:06   Ir US Guide Vasc Access Right  Result Date: 06/28/2019 CLINICAL DATA:  Renal failure, needs tunneled pheresis catheter, with possible future use for hemodialysis. EXAM: TUNNELED PHERESIS/HEMODIALYSIS CATHETER PLACEMENT WITH ULTRASOUND AND FLUOROSCOPIC GUIDANCE TECHNIQUE: The procedure, risks, benefits, and alternatives were explained to the patient. Questions regarding the procedure were encouraged and answered. The patient understands and consents to the procedure. As antibiotic prophylaxis, vancomycin 1 g was ordered pre-procedure and administered intravenously within one hour of incision.Patency of the right IJ vein was confirmed with ultrasound with image documentation. An appropriate skin site was determined. Region was prepped using maximum barrier technique including cap and mask, sterile gown, sterile gloves, large sterile sheet, and Chlorhexidine as cutaneous antisepsis. The region was infiltrated locally with 1% lidocaine. Intravenous Fentanyl 46mg and Versed 149mwere administered as conscious sedation during continuous monitoring of the patient's level of consciousness and physiological / cardiorespiratory status by the radiology RN, with a total moderate sedation time of 10 minutes. Under real-time ultrasound guidance, the right IJ vein was accessed with a 21 gauge micropuncture needle; the needle tip within the vein was confirmed with ultrasound image documentation. Needle exchanged over the 018 guidewire for transitional dilator, which allowed advancement of a Benson wire into the IVC. Over this, an MPA catheter was advanced. A Palindrome 23 hemodialysis catheter was tunneled from the right anterior chest wall approach to the right IJ dermatotomy site. The MPA catheter was exchanged over an Amplatz wire for serial vascular dilators which allow placement of a peel-away sheath, through which the catheter was advanced under  intermittent fluoroscopy, positioned with its tips in the proximal and midright atrium. Spot chest radiograph confirms good catheter position. No pneumothorax. Catheter was flushed and primed per protocol. Catheter secured externally with O Prolene sutures. The right IJ dermatotomy site was closed with Dermabond. COMPLICATIONS: COMPLICATIONS None immediate FLUOROSCOPY TIME:  0.9 minute; 118  uGym2 DAP COMPARISON:  None IMPRESSION: 1. Technically successful placement of tunneled right IJ hemodialysis/pheresis catheter with ultrasound and fluoroscopic guidance. Ready for routine use. ACCESS: Remains approachable for percutaneous intervention as needed. Electronically Signed   By: D Lucrezia Europe.D.   On: 06/28/2019 17:06   Vas UsKoreapper Ext Vein Mapping (pre-op Avf)  Result Date: 07/01/2019 UPPER EXTREMITY VEIN MAPPING  Indications: Pre-access. Comparison Study: No prior study Performing Technologist: MiMaudry MayhewHA, RDMS, RVT, RDCS  Examination Guidelines: A complete evaluation includes B-mode imaging, spectral Doppler, color Doppler, and power Doppler as needed of all accessible portions of each vessel. Bilateral testing is considered an integral part of a complete examination. Limited examinations for reoccurring indications may be performed as noted. +-----------------+-------------+----------+--------------+ Right Cephalic   Diameter (cm)Depth (cm)   Findings    +-----------------+-------------+----------+--------------+ Shoulder             0.17        1.35                  +-----------------+-------------+----------+--------------+ Prox upper arm       0.19        0.56                  +-----------------+-------------+----------+--------------+ Mid  upper arm        0.13        0.23                  +-----------------+-------------+----------+--------------+ Dist upper arm       0.10        0.28                  +-----------------+-------------+----------+--------------+  Antecubital fossa    0.28        0.33                  +-----------------+-------------+----------+--------------+ Prox forearm         0.16        0.45                  +-----------------+-------------+----------+--------------+ Mid forearm          0.12        0.43                  +-----------------+-------------+----------+--------------+ Dist forearm         0.13        0.36                  +-----------------+-------------+----------+--------------+ Wrist                                   not visualized +-----------------+-------------+----------+--------------+ +-----------------+-------------+----------+--------------+ Right Basilic    Diameter (cm)Depth (cm)   Findings    +-----------------+-------------+----------+--------------+ Prox upper arm                          not visualized +-----------------+-------------+----------+--------------+ Mid upper arm                           not visualized +-----------------+-------------+----------+--------------+ Dist upper arm       0.38                              +-----------------+-------------+----------+--------------+ Antecubital fossa    0.24                 branching    +-----------------+-------------+----------+--------------+ Prox forearm                            not visualized +-----------------+-------------+----------+--------------+ Mid forearm                             not visualized +-----------------+-------------+----------+--------------+ Distal forearm                          not visualized +-----------------+-------------+----------+--------------+ Wrist                                   not visualized +-----------------+-------------+----------+--------------+ +-----------------+-------------+----------+--------------+ Left Cephalic    Diameter (cm)Depth (cm)   Findings    +-----------------+-------------+----------+--------------+ Shoulder             0.08         0.25                  +-----------------+-------------+----------+--------------+ Prox upper arm  0.08        0.22                  +-----------------+-------------+----------+--------------+ Mid upper arm        0.10        0.26                  +-----------------+-------------+----------+--------------+ Dist upper arm       0.11        0.22                  +-----------------+-------------+----------+--------------+ Antecubital fossa    0.21        0.22                  +-----------------+-------------+----------+--------------+ Prox forearm         0.15        0.31                  +-----------------+-------------+----------+--------------+ Mid forearm          0.09        0.18                  +-----------------+-------------+----------+--------------+ Dist forearm                            not visualized +-----------------+-------------+----------+--------------+ Wrist                                   not visualized +-----------------+-------------+----------+--------------+ +-----------------+-------------+----------+--------------+ Left Basilic     Diameter (cm)Depth (cm)   Findings    +-----------------+-------------+----------+--------------+ Prox upper arm       0.46                              +-----------------+-------------+----------+--------------+ Mid upper arm        0.27                              +-----------------+-------------+----------+--------------+ Dist upper arm       0.32                              +-----------------+-------------+----------+--------------+ Antecubital fossa    0.27                              +-----------------+-------------+----------+--------------+ Prox forearm         0.16                              +-----------------+-------------+----------+--------------+ Mid forearm                             not visualized +-----------------+-------------+----------+--------------+  Distal forearm                          not visualized +-----------------+-------------+----------+--------------+ Wrist                                   not visualized +-----------------+-------------+----------+--------------+ *See table(s) above for measurements and  observations.  Diagnosing physician: Servando Snare MD Electronically signed by Servando Snare MD on 07/01/2019 at 4:27:38 PM.    Final     ASSESSMENT & PLAN:     1) Progressive Kappa light chain Multiple Myeloma -- with likely progression since patient abandoned treatment earlier this year. 2) Worsening renal failure -- likely myeloma kidney 3) Anemia due to CKD, myeloma , RCC  4) RCC - 7.6 cms in upper pole of rt kidney.  PLAN:  -Discussed pt labwork today, 07/22/19; CBC w/diff and CMP is as follows: all values are WNL except for RBC at 2.79, Hemoglobin at 8.8, HCT at 26.9, CMP reviewed -Discussed why pt wants to stop all treatment. Attempted to convince pt to continue treatment. Pt was adamant that his faith is all he needs to be cured. He stated that his son and family were aware of his decision to stop all treatment. He is aware that the last time he abandoned treatment his kidney got worse progressively and so did his anemia. -patient chooses to cancel all medical oncology followup at this time. -he was offered option for best supportive cares through hospice and refused this as well.  FOLLOW UP: IR appointment for removal of tunnelled dialysis catheter Cancel all currently scheduled labs, MD visits and treatments F/u with PCP    The total time spent in the appt was 35 minutes and more than 50% was on counseling and direct patient cares.  All of the patient's questions were answered with apparent satisfaction. The patient knows to call the clinic with any problems, questions or concerns.     Sullivan Lone MD MS AAHIVMS [facility] Hematology/Oncology Physician [facility]  (Office):        [phone] (Work cell):  [phone] (Fax):           [phone]  07/22/2019 9:45 AM  I, Scot Dock, am acting as a scribe for Dr. Sullivan Lone.   .I have reviewed the above documentation for accuracy and completeness, and I agree with the above. Brunetta Genera MD

## 2019-07-22 ENCOUNTER — Telehealth: Payer: Self-pay | Admitting: *Deleted

## 2019-07-22 ENCOUNTER — Other Ambulatory Visit: Payer: Self-pay

## 2019-07-22 ENCOUNTER — Inpatient Hospital Stay: Payer: Medicare Other

## 2019-07-22 ENCOUNTER — Telehealth: Payer: Self-pay | Admitting: Hematology

## 2019-07-22 ENCOUNTER — Inpatient Hospital Stay (HOSPITAL_BASED_OUTPATIENT_CLINIC_OR_DEPARTMENT_OTHER): Payer: Medicare Other | Admitting: Hematology

## 2019-07-22 VITALS — BP 142/85 | HR 71 | Temp 98.0°F | Resp 18 | Ht 67.0 in | Wt 213.5 lb

## 2019-07-22 DIAGNOSIS — C9000 Multiple myeloma not having achieved remission: Secondary | ICD-10-CM

## 2019-07-22 DIAGNOSIS — Z7189 Other specified counseling: Secondary | ICD-10-CM | POA: Diagnosis not present

## 2019-07-22 LAB — CBC WITH DIFFERENTIAL/PLATELET
Abs Immature Granulocytes: 0.02 10*3/uL (ref 0.00–0.07)
Basophils Absolute: 0 10*3/uL (ref 0.0–0.1)
Basophils Relative: 1 %
Eosinophils Absolute: 0.1 10*3/uL (ref 0.0–0.5)
Eosinophils Relative: 2 %
HCT: 26.9 % — ABNORMAL LOW (ref 39.0–52.0)
Hemoglobin: 8.8 g/dL — ABNORMAL LOW (ref 13.0–17.0)
Immature Granulocytes: 0 %
Lymphocytes Relative: 20 %
Lymphs Abs: 1.1 10*3/uL (ref 0.7–4.0)
MCH: 31.5 pg (ref 26.0–34.0)
MCHC: 32.7 g/dL (ref 30.0–36.0)
MCV: 96.4 fL (ref 80.0–100.0)
Monocytes Absolute: 0.5 10*3/uL (ref 0.1–1.0)
Monocytes Relative: 10 %
Neutro Abs: 3.6 10*3/uL (ref 1.7–7.7)
Neutrophils Relative %: 67 %
Platelets: 169 10*3/uL (ref 150–400)
RBC: 2.79 MIL/uL — ABNORMAL LOW (ref 4.22–5.81)
RDW: 13.4 % (ref 11.5–15.5)
WBC: 5.3 10*3/uL (ref 4.0–10.5)
nRBC: 0 % (ref 0.0–0.2)

## 2019-07-22 LAB — CMP (CANCER CENTER ONLY)
ALT: 28 U/L (ref 0–44)
AST: 26 U/L (ref 15–41)
Albumin: 4.2 g/dL (ref 3.5–5.0)
Alkaline Phosphatase: 112 U/L (ref 38–126)
Anion gap: 12 (ref 5–15)
BUN: 65 mg/dL — ABNORMAL HIGH (ref 8–23)
CO2: 22 mmol/L (ref 22–32)
Calcium: 9 mg/dL (ref 8.9–10.3)
Chloride: 106 mmol/L (ref 98–111)
Creatinine: 4.27 mg/dL (ref 0.61–1.24)
GFR, Est AFR Am: 15 mL/min — ABNORMAL LOW (ref >60)
GFR, Estimated: 13 mL/min — ABNORMAL LOW (ref >60)
Glucose, Bld: 131 mg/dL — ABNORMAL HIGH (ref 70–99)
Potassium: 4.1 mmol/L (ref 3.5–5.1)
Sodium: 140 mmol/L (ref 135–145)
Total Bilirubin: 0.5 mg/dL (ref 0.3–1.2)
Total Protein: 6.8 g/dL (ref 6.5–8.1)

## 2019-07-22 NOTE — Telephone Encounter (Signed)
Scheduled per 11/19 los, cancelled all future appointments and patient received central radiology's number.

## 2019-07-22 NOTE — Telephone Encounter (Signed)
Patient presented to [facility] reception desk on 07/21/2019 afternoon around 3:30. He informed reception that he would not be coming to appts today for lab/MD/Infusion. Spoke with patient in lobby, and he stated he felt awful last week after his treatment and that he did not think he wanted to have any more. I encouraged him to keep appt on 11/19 to discuss his concerns with Dr.Kale. He verbalized understanding. 11/19: Attempted to contact patient and left voice mail regarding appt times 11/19 AM

## 2019-07-23 ENCOUNTER — Other Ambulatory Visit: Payer: Self-pay | Admitting: Hematology

## 2019-07-26 ENCOUNTER — Encounter (HOSPITAL_COMMUNITY): Payer: Self-pay | Admitting: Radiology

## 2019-07-26 ENCOUNTER — Ambulatory Visit (HOSPITAL_COMMUNITY)
Admission: RE | Admit: 2019-07-26 | Discharge: 2019-07-26 | Disposition: A | Discharge status: Home / Self Care | Payer: Medicare Other | Source: Ambulatory Visit | Attending: Hematology | Admitting: Hematology

## 2019-07-26 ENCOUNTER — Other Ambulatory Visit: Payer: Self-pay | Admitting: Hematology

## 2019-07-26 ENCOUNTER — Other Ambulatory Visit: Payer: Self-pay

## 2019-07-26 DIAGNOSIS — C9000 Multiple myeloma not having achieved remission: Secondary | ICD-10-CM

## 2019-07-26 DIAGNOSIS — Z4901 Encounter for fitting and adjustment of extracorporeal dialysis catheter: Secondary | ICD-10-CM | POA: Diagnosis not present

## 2019-07-26 DIAGNOSIS — N179 Acute kidney failure, unspecified: Secondary | ICD-10-CM | POA: Diagnosis not present

## 2019-07-26 HISTORY — PX: IR REMOVAL TUN CV CATH W/O FL: IMG2289

## 2019-07-26 MED ORDER — LIDOCAINE HCL 1 % IJ SOLN
INTRAMUSCULAR | Status: DC | PRN
  Administered 2019-07-26: 5 mL

## 2019-07-26 MED ORDER — LIDOCAINE HCL 1 % IJ SOLN
INTRAMUSCULAR | Status: AC
  Filled 2019-07-26: qty 20

## 2019-07-26 NOTE — Procedures (Signed)
Successful removal of tunneled (R)IJ HD catheter. No complications.  Ascencion Dike PA-C Interventional Radiology 07/26/2019 1:56 PM

## 2019-07-28 ENCOUNTER — Ambulatory Visit (HOSPITAL_COMMUNITY): Payer: Medicare Other | Attending: Cardiovascular Disease

## 2019-07-28 ENCOUNTER — Other Ambulatory Visit: Payer: Self-pay

## 2019-07-28 ENCOUNTER — Other Ambulatory Visit (HOSPITAL_COMMUNITY): Payer: Self-pay | Admitting: Cardiology

## 2019-07-28 DIAGNOSIS — I351 Nonrheumatic aortic (valve) insufficiency: Secondary | ICD-10-CM | POA: Diagnosis not present

## 2019-07-30 ENCOUNTER — Other Ambulatory Visit: Payer: Medicare Other

## 2019-07-30 ENCOUNTER — Ambulatory Visit: Payer: Medicare Other

## 2019-08-04 DIAGNOSIS — I712 Thoracic aortic aneurysm, without rupture: Secondary | ICD-10-CM | POA: Insufficient documentation

## 2019-08-04 DIAGNOSIS — I7121 Aneurysm of the ascending aorta, without rupture: Secondary | ICD-10-CM | POA: Insufficient documentation

## 2019-08-04 DIAGNOSIS — Z7189 Other specified counseling: Secondary | ICD-10-CM | POA: Insufficient documentation

## 2019-08-04 NOTE — Progress Notes (Signed)
Cardiology Office Note   Date:  08/05/2019   ID:  [name] [name], DOB Oct 07, 1945, MRN [id number]  PCP:  Scot Jun, FNP  Cardiologist:   Minus Breeding, MD   Chief Complaint  Patient presents with  . Palpitations      History of Present Illness: [name] [name] is a 73 y.o. male who presents for evaluation of palpitations and CAD.  He was in the hospital in October.  He had acute on chronic renal insufficiency.  He was being managed for hematuria and was having trouble with urination.  He has had multiple myeloma and renal cell cancer.  I reviewed the oncology note from last month and the patient has decided to stop all future oncology treatment.  He refused hospice.  He had his dialysis catheter removed.  He did however follow up with an echo and had moderate AI and mild MR.  He had aortic root enlargement.     He has had some palpitations and today he has some premature ectopic beats which is likely what he is feeling.  Is not had any new documented atrial fibrillation.  He is not having any new shortness of breath.  He has not had any chest pressure, neck or arm discomfort.  Has had no weight gain or edema.  He walks around the parking lot and he is apartment for exercise.  He has been losing a little weight.  He sometimes falls asleep after eating dinner and wonders if this could be syncope but it does not sound like frank syncope.   Past Medical History:  Diagnosis Date  . Abnormal CT of liver    a. nodular contour suggesting cirrhosis 05/2018.  [name] Abnormal LFTs (liver function tests)   . Anxiety   . Aortic root dilatation ([place])   . Ascending aortic aneurysm ([place])   . BPH with urinary obstruction   . Bradycardia   . 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 CAD (coronary artery disease)    a. 12/2015 NSTEMI/Cath (in setting of PAF):  LM nl, LAD 30p,  LCX 6m RCA  ok, AM 100%, RPDA1 40, RPDA 2  60 ->Med Rx.  . Childhood asthma   . Chronic diastolic CHF (congestive heart failure) ([place]    a. 12/2015 Echo: EF 50-55%, mod AI, mod Ao root dil, mild MR, mod dil LA, mod RA.  . CKD (chronic kidney disease), stage III   . Colon cancer ([place]   . COPD (chronic obstructive pulmonary disease) ([place]    pt denies at preop  . Diabetes mellitus type II    diet controlled  . History of syncope   . Hyperlipidemia   . Hypertensive heart disease   . Kidney lump 04/04/2010   Overview:  Renal Cell Carcinoma   . Light chain myeloma ([facility]   . Moderate aortic insufficiency    a. 12/2015 Echo: Mod AI.  .Marland KitchenParoxysmal atrial fibrillation ([place]    a. 12/2015 started on Xarelto (CHA2DS2VASc = 4-5).  . Pneumonia   . Prostate cancer ([place]   . Renal cell carcinoma (HU[place]   . Sleep apnea    Does not like  CPAP  . Spinal stenosis in cervical region 10/12/2016  . Venous insufficiency     Past Surgical History:  Procedure Laterality Date  . BACK SURGERY    . CARDIAC CATHETERIZATION N/A 12/18/2015   Procedure: Left Heart Cath and  Coronary Angiography;  Surgeon: Leonie Man, MD;  Location: [facility];  Service: Cardiovascular;  Laterality: N/A;  . DIAGNOSTIC LAPAROSCOPY     partial colectomy  . IR FLUORO GUIDE CV LINE RIGHT  06/28/2019  . IR REMOVAL TUN CV CATH W/O FL  07/26/2019  . IR US GUIDE VASC ACCESS RIGHT  06/28/2019  . LAPAROSCOPIC PARTIAL COLECTOMY Right 05/21/2018   Procedure: LAPAROSCOPIC RIGHT  COLECTOMY ERAS PATHWAY;  Surgeon: Leighton Ruff, MD;  Location: [facility];  Service: General;  Laterality: Right;  . [place]   "replaced a disc"     Current Outpatient Medications  Medication Sig Dispense Refill  . amLODipine (NORVASC) 10 MG tablet Take 1 tablet (10 mg total) by mouth daily. 90 tablet 0  . cetirizine (ZYRTEC) 10 MG tablet Take 10 mg by mouth daily as needed (seasonal allergies).     . fluticasone (FLONASE) 50 MCG/ACT nasal spray Place 2 sprays into both  nostrils daily as needed (seasonal allergies).     [name] glucose blood test strip Check blood sugar twice per day. E.11.9 100 each 12  . tamsulosin (FLOMAX) 0.4 MG CAPS capsule Take 0.4 mg by mouth daily.    [name] acyclovir (ZOVIRAX) 200 MG capsule Take 1 capsule (200 mg total) by mouth 2 (two) times daily. 60 capsule 0  . atorvastatin (LIPITOR) 80 MG tablet Take 1 tablet (80 mg total) by mouth daily. (Patient not taking: Reported on 06/27/2019) 90 tablet 0  . feeding supplement, ENSURE ENLIVE, (ENSURE ENLIVE) LIQD Take 237 mLs by mouth 2 (two) times daily between meals. (Patient not taking: Reported on 08/05/2019) 237 mL 12   No current facility-administered medications for this visit.     Allergies:   Amoxicillin and Penicillins    ROS:  Please see the history of present illness.   Otherwise, review of systems are positive for NONE.   All other systems are reviewed and negative.    PHYSICAL EXAM: VS:  BP 140/78   Pulse (!) 59   Temp 98.1 F (36.7 C)   Ht 5' 8.5" (1.74 m)   Wt 210 lb 12.8 oz (95.6 kg)   SpO2 99%   BMI 31.59 kg/m  , BMI Body mass index is 31.59 kg/m. GENERAL:  Well appearing NECK:  No jugular venous distention, waveform within normal limits, carotid upstroke brisk and symmetric, no bruits, no thyromegaly LUNGS:  Clear to auscultation bilaterally CHEST:  Unremarkable HEART:  PMI not displaced or sustained,S1 and S2 within normal limits, no S3, no S4, no clicks, no rubs, 3 out of 6 apical systolic murmur radiating slightly at the aortic outflow tract, 2 out of 6 diastolic murmur murmurs ABD:  Flat, positive bowel sounds normal in frequency in pitch, no bruits, no rebound, no guarding, no midline pulsatile mass, no hepatomegaly, no splenomegaly, midline abdominal hernia EXT:  2 plus pulses throughout, no edema, no cyanosis no clubbing    EKG:  EKG is ordered today. Sinus rhythm, rate 59, axis left with left anterior fascicular block, inferolateral T wave inversions    Recent Labs: 09/09/2018: B Natriuretic Peptide 36.2 10/22/2018: TSH 2.099 07/22/2019: ALT 28; BUN 65; Creatinine 4.27; Hemoglobin 8.8; Platelets 169; Potassium 4.1; Sodium 140    Lipid Panel    Component Value Date/Time   CHOL 199 09/30/2017 1042   TRIG 109 09/30/2017 1042   HDL 56 09/30/2017 1042   CHOLHDL 3.6 09/30/2017 1042   CHOLHDL 3.3 10/11/2016 1900   VLDL  27 10/11/2016 1900   LDLCALC 121 (H) 09/30/2017 1042      Wt Readings from Last 3 Encounters:  08/05/19 210 lb 12.8 oz (95.6 kg)  07/22/19 213 lb 8 oz (96.8 kg)  07/08/19 213 lb 12.8 oz (97 kg)      Other studies Reviewed: Additional studies/ records that were reviewed today include: Onc records.  Review of the above records demonstrates:  Please see elsewhere in the note.     ASSESSMENT AND PLAN:  Palpitations There is no evidence that he is having recurrent paroxysmal atrial fibrillation.  He does have documented PACs.  At this point no change in therapy and he and I talked about this.  Atrial fibrillation He be very high risk for anticoagulation.  Is not had any documented symptomatic recurrence of this.  No change in therapy.  Essential (primary) hypertension The blood pressure is well controlled.  No change in therapy.   CAD (coronary artery disease) He had a non-STEMI in April 2017.  Anatomy is as above.  No further imagin  Chronic diastolic CHF (congestive heart failure) ([place]) He seems to be euvolemic.  He seems to be euvolemic.  No change in therapy.  AI He has had aortic insufficiency as above.  Medical management.   I will consider following up echo 1 year after the last one.   AORTIC ENLARGEMENT:  He would not be a candidate for repair given his untreated comorbid diseases.  No further imaging planned.     COVID EDUCATION: He understands the need to avoid this and we talked about getting the vaccine.  Current medicines are reviewed at length with the patient today.  The patient does not have  concerns regarding medicines.  The following changes have been made:  None  Labs/ tests ordered today include: None  Orders Placed This Encounter  Procedures  . EKG 12-Lead     Disposition:   FU with me in six months.   Signed, Minus Breeding, MD  08/05/2019 10:59 AM    [place]

## 2019-08-05 ENCOUNTER — Other Ambulatory Visit: Payer: Self-pay

## 2019-08-05 ENCOUNTER — Ambulatory Visit (INDEPENDENT_AMBULATORY_CARE_PROVIDER_SITE_OTHER): Payer: Medicare Other | Admitting: Cardiology

## 2019-08-05 ENCOUNTER — Ambulatory Visit: Payer: Medicare Other

## 2019-08-05 ENCOUNTER — Other Ambulatory Visit: Payer: Medicare Other

## 2019-08-05 ENCOUNTER — Encounter: Payer: Self-pay | Admitting: Cardiology

## 2019-08-05 ENCOUNTER — Ambulatory Visit: Payer: Medicare Other | Admitting: Hematology

## 2019-08-05 VITALS — BP 140/78 | HR 59 | Temp 98.1°F | Ht 68.5 in | Wt 210.8 lb

## 2019-08-05 DIAGNOSIS — I712 Thoracic aortic aneurysm, without rupture: Secondary | ICD-10-CM

## 2019-08-05 DIAGNOSIS — I5032 Chronic diastolic (congestive) heart failure: Secondary | ICD-10-CM | POA: Diagnosis not present

## 2019-08-05 DIAGNOSIS — Z7189 Other specified counseling: Secondary | ICD-10-CM

## 2019-08-05 DIAGNOSIS — R002 Palpitations: Secondary | ICD-10-CM

## 2019-08-05 DIAGNOSIS — I10 Essential (primary) hypertension: Secondary | ICD-10-CM

## 2019-08-05 DIAGNOSIS — I351 Nonrheumatic aortic (valve) insufficiency: Secondary | ICD-10-CM

## 2019-08-05 DIAGNOSIS — I7121 Aneurysm of the ascending aorta, without rupture: Secondary | ICD-10-CM

## 2019-08-05 NOTE — Patient Instructions (Signed)
Medication Instructions:  Your physician recommends that you continue on your current medications as directed. Please refer to the Current Medication list given to you today.  *If you need a refill on your cardiac medications before your next appointment, please call your pharmacy*  Lab Work: NONE If you have labs (blood work) drawn today and your tests are completely normal, you will receive your results only by: [name] MyChart Message (if you have MyChart) OR . A paper copy in the mail If you have any lab test that is abnormal or we need to change your treatment, we will call you to review the results.  Testing/Procedures: NONE  Follow-Up: At [facility], you and your health needs are our priority.  As part of our continuing mission to provide you with exceptional heart care, we have created designated Provider Care Teams.  These Care Teams include your primary Cardiologist (physician) and Advanced Practice Providers (APPs -  Physician Assistants and Nurse Practitioners) who all work together to provide you with the care you need, when you need it.  Your next appointment:   6 month(s)  The format for your next appointment:   Either In Person or Virtual  Provider:   You may see Minus Breeding, MD or one of the following Advanced Practice Providers on your designated Care Team:    Rosaria Ferries, PA-C  Jory Sims, DNP, ANP  Cadence Kathlen Mody, NP

## 2019-08-12 ENCOUNTER — Other Ambulatory Visit: Payer: Medicare Other

## 2019-08-12 ENCOUNTER — Ambulatory Visit: Payer: Medicare Other

## 2019-08-16 ENCOUNTER — Ambulatory Visit (INDEPENDENT_AMBULATORY_CARE_PROVIDER_SITE_OTHER): Payer: Medicare Other | Admitting: Family Medicine

## 2019-08-16 DIAGNOSIS — J309 Allergic rhinitis, unspecified: Secondary | ICD-10-CM

## 2019-08-16 DIAGNOSIS — E1121 Type 2 diabetes mellitus with diabetic nephropathy: Secondary | ICD-10-CM | POA: Diagnosis not present

## 2019-08-16 DIAGNOSIS — C9000 Multiple myeloma not having achieved remission: Secondary | ICD-10-CM

## 2019-08-16 DIAGNOSIS — I5032 Chronic diastolic (congestive) heart failure: Secondary | ICD-10-CM

## 2019-08-16 DIAGNOSIS — C641 Malignant neoplasm of right kidney, except renal pelvis: Secondary | ICD-10-CM

## 2019-08-16 DIAGNOSIS — Z91199 Patient's noncompliance with other medical treatment and regimen due to unspecified reason: Secondary | ICD-10-CM

## 2019-08-16 DIAGNOSIS — I10 Essential (primary) hypertension: Secondary | ICD-10-CM

## 2019-08-16 DIAGNOSIS — N1831 Chronic kidney disease, stage 3a: Secondary | ICD-10-CM

## 2019-08-16 DIAGNOSIS — D638 Anemia in other chronic diseases classified elsewhere: Secondary | ICD-10-CM

## 2019-08-16 DIAGNOSIS — Z9119 Patient's noncompliance with other medical treatment and regimen: Secondary | ICD-10-CM

## 2019-08-16 DIAGNOSIS — N1832 Chronic kidney disease, stage 3b: Secondary | ICD-10-CM

## 2019-08-16 DIAGNOSIS — E1122 Type 2 diabetes mellitus with diabetic chronic kidney disease: Secondary | ICD-10-CM

## 2019-08-16 MED ORDER — AMLODIPINE BESYLATE 10 MG PO TABS: 10.0000 mg | ORAL_TABLET | Freq: Every day | ORAL | 0 refills | Status: DC

## 2019-08-16 MED ORDER — ONETOUCH ULTRA VI STRP: ORAL_STRIP | 3 refills | Status: DC

## 2019-08-16 MED ORDER — CETIRIZINE HCL 10 MG PO TABS: 10.0000 mg | ORAL_TABLET | Freq: Every day | ORAL | 0 refills | Status: DC | PRN

## 2019-08-16 NOTE — Progress Notes (Signed)
Virtual Visit via Telephone Note  I connected with [name] [name] on 08/16/19 at  2:10 PM EST by telephone and verified that I am speaking with the correct person using two identifiers.   I discussed the limitations, risks, security and privacy concerns of performing an evaluation and management service by telephone and the availability of in person appointments. I also discussed with the patient that there may be a patient responsible charge related to this service. The patient expressed understanding and agreed to proceed.  Patient Location: Home Provider Location: [facility] Office Others participating in call: none   History of Present Illness:        73 year old male patient of Molli Barrows, NP whom she last saw via telemedicine visit on 03/18/2019, due to patient's complaint of worsening abdominal enlargement and abdominal pain.  Patient with history of metastatic multiple myeloma and renal cancer.  Per prior visit note, patient had stopped oncology follow-up of multiple myeloma and attempts at having patient seen by urology regarding renal cell carcinoma had been unsuccessful as patient had missed his appointments.         At today's visit, patient reports that he needs refills of his test strips to check his blood sugars in follow-up of his diabetes.  He is not currently on any medications for diabetes as he reports that when he was taking medications, this will cause his blood sugars to drop.  He currently takes his blood sugars twice daily and states that he has switched to a diet that is mostly fruits and vegetables in order to stay healthy.  His blood sugars are generally in the 120s or less but sometimes higher depending on what he eats.  He also reports that he is trying to stay healthy by walking on a daily basis.  He would like refill of his blood pressure medicine amlodipine as well as refill of his allergy medicine, cetirizine.  He denies any headaches or dizziness related to his blood  pressure.  He has had recent follow-up with his heart doctor regarding CHF.  He denies any issues with peripheral edema.  He request a refill of potassium 20 mEq which he takes twice daily with applesauce.  Discussed with patient that this medicine does not appear on his medical records from this office or on his cardiologist record list but patient states that this was initially prescribed at [facility]. He would like to have his potassium level rechecked and spoke with the medical assistant earlier about coming in tomorrow to have blood work.        He reports no issues with swelling in his lower extremities.  No issues with shortness of breath.  No chest pain.  He did have palpitations and saw cardiology recently.  He has also recently seen his cancer doctors and patient reports that he does not want any further treatment regarding multiple myeloma and he does not believe that he has renal cancer.  He has been seen in the emergency department secondary to blood in the urine but he reports that this has resolved.  He states that overall he feels fairly well and he is certain that with exercise and proper diet he will continue to feel well.  He has had recent emergency department visit for blood in the urine/hematuria but he states that this is resolved.  He feels as if his urination is improving as he no longer has to go to the restroom as frequently at night as he once did.  He denies any loss of appetite, no fever or chills.  He does have fatigue.  He denies chest pain, palpitations have decreased since recent cardiology visit.   Past Medical History:  Diagnosis Date  . Abnormal CT of liver    a. nodular contour suggesting cirrhosis 05/2018.  [name] Abnormal LFTs (liver function tests)   . Anxiety   . Aortic root dilatation ([place])   . Ascending aortic aneurysm ([place])   . BPH with urinary obstruction   . Bradycardia   . 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 CAD (coronary artery disease)    a. 12/2015 NSTEMI/Cath (in setting of PAF):  LM nl, LAD 30p,  LCX 35m RCA  ok, AM 100%, RPDA1 40, RPDA 2 60 ->Med Rx.  . Childhood asthma   . Chronic diastolic CHF (congestive heart failure) ([place]    a. 12/2015 Echo: EF 50-55%, mod AI, mod Ao root dil, mild MR, mod dil LA, mod RA.  . CKD (chronic kidney disease), stage III   . Colon cancer ([place]   . COPD (chronic obstructive pulmonary disease) ([place]    pt denies at preop  . Diabetes mellitus type II    diet controlled  . History of syncope   . Hyperlipidemia   . Hypertensive heart disease   . Kidney lump 04/04/2010   Overview:  Renal Cell Carcinoma   . Light chain myeloma ([place]   . Moderate aortic insufficiency    a. 12/2015 Echo: Mod AI.  .Marland KitchenParoxysmal atrial fibrillation ([place]    a. 12/2015 started on Xarelto (CHA2DS2VASc = 4-5).  . Pneumonia   . Prostate cancer ([place]   . Renal cell carcinoma (HG[place]   . Sleep apnea    Does not like  CPAP  . Spinal stenosis in cervical region 10/12/2016  . Venous insufficiency     Past Surgical History:  Procedure Laterality Date  . BACK SURGERY    . CARDIAC CATHETERIZATION N/A 12/18/2015   Procedure: Left Heart Cath and Coronary Angiography;  Surgeon: DLeonie Man MD;  [facility];  Service: Cardiovascular;  Laterality: N/A;  . DIAGNOSTIC LAPAROSCOPY     partial colectomy  . IR FLUORO GUIDE CV LINE RIGHT  06/28/2019  . IR REMOVAL TUN CV CATH W/O FL  07/26/2019  . IR [place]ASC ACCESS RIGHT  06/28/2019  . LAPAROSCOPIC PARTIAL COLECTOMY Right 05/21/2018   Procedure: LAPAROSCOPIC RIGHT  COLECTOMY ERAS PATHWAY;  Surgeon: TLeighton Ruff MD;  Location[facility];  Service: General;  Laterality: Right;  . [place]  "replaced a disc"    Family History  Problem Relation Age of Onset  . Emphysema Father   . Asthma Father   . Liver disease Father        tumor  . Heart disease Mother   .  Hypertension Mother   . Asthma Sister   . Hypertension Son     Social History   Tobacco Use  . Smoking status: Former Smoker    Types: Cigars    Quit date: 09/02/1977    Years since quitting: 41.9  . Smokeless tobacco: Never Used  . Tobacco comment:    Substance Use Topics  . Alcohol use: Not Currently    Comment: "stopped drinking alcohol in ~ 1980; just drank a little on the weekends when I did drink"  . Drug use: No  Allergies  Allergen Reactions  . Amoxicillin Other (See Comments)    Tolerates Unasyn. Can't move Has patient had a PCN reaction causing immediate rash, facial/tongue/throat swelling, SOB or lightheadedness with hypotension: No Has patient had a PCN reaction causing severe rash involving mucus membranes or skin necrosis: No Has patient had a PCN reaction that required hospitalization No Has patient had a PCN reaction occurring within the last 10 years: No If all of the above answers are "NO", then may proceed with Cephalosporin use.   [name] Penicillins Other (See Comments)    Tolerates Unasyn. Can't move/dizziness Has patient had a PCN reaction causing immediate rash, facial/tongue/throat swelling, SOB or lightheadedness with hypotension: No Has patient had a PCN reaction causing severe rash involving mucus membranes or skin necrosis: No Has patient had a PCN reaction that required hospitalization No Has patient had a PCN reaction occurring within the last 10 years: No If all of the above answers are "NO", then may proceed with Cephalosporin use.         Observations/Objective: No vital signs or physical exam conducted as visit was done via telephone  Assessment and Plan: 1. Type 2 diabetes mellitus with stage 3b chronic kidney disease, without long-term current use of insulin ([place]) Patient reports that he is not currently on any medications for treatment of his diabetes but has made dietary changes and is walking for exercise.  He does need new test strips  in order to continue to monitor his blood sugars and per medical assistant, he is currently using One Touch ultra test strips after consultation with his pharmacy.  Patient has had hemoglobin A1c on 06/26/2019 with blood sugars being on average within normal at 5.2. - glucose blood (ONETOUCH ULTRA) test strip; Check FSBS twice a day. Dx: E11.21, N18.32  Dispense: 100 each; Refill: 3  2. Stage 3a chronic kidney disease 3. Renal cell carcinoma of right kidney ([place]); 7.  Multiple myeloma, metastatic; noncompliance with medical treatment plan Patient is status post oncology visit on 07/22/2019 on review of chart and chart note regarding visit was discussed with the patient.  Patient reports that he continues to not want any additional follow-up regarding renal cell carcinoma and multiple myeloma.  He does not believe that he actually has renal cell carcinoma and that he needs to make a follow-up with urology so that the mass can be removed from his kidney and he states that then everything will be fine.  Per oncology note, patient has refused medical intervention in the past regarding cancer treatment and had worsening of his chronic kidney disease.  He reports that with changes in his diet and exercise, he is sure that his renal function will be fine and that overall his medical condition will improve.  Patient repeatedly requested refills of potassium 20 mEq which he reports that he takes twice daily.  Discussed with patient that this was not on his medication list at this office and not listed on his recent cardiology note.  Per CMA, it appears that he did receive potassium after an emergency department visit on 2 different occasions but these were 5-day prescriptions.  Patient was made aware that because of his chronic kidney disease that it would not be a good idea for him to take additional potassium.  He will come to clinic tomorrow to have electrolytes rechecked and he will be notified if any supplemental  potassium is needed but he was advised not to take additional potassium supplementation without consulting a physician/medical  provider.  4. Anemia, chronic disease Patient with anemia of chronic disease as patient with chronic kidney disease, history of colon cancer, history of prostate cancer, multiple myeloma and renal cell carcinoma.  Most recent hemoglobin of 8.8 on 07/22/2019 with oncology.  Per oncology note, he does not wish to receive any further treatment and he again stated this at today's visit when recent cardiology note was reviewed with the patient  5. Essential hypertension He reports that his blood pressure has been stable and controlled and requests refill of amlodipine which was provided at today's visit. - amLODipine (NORVASC) 10 MG tablet; Take 1 tablet (10 mg total) by mouth daily.  Dispense: 90 tablet; Refill: 0  6. Chronic diastolic CHF (congestive heart failure) ([place]) Stable per cardiologist who saw patient on 08/05/2019 and no changes in therapy needed.   8. Allergic rhinitis, unspecified seasonality, unspecified trigger Refill per patient request of cetirizine to help with nasal congestion.  If patient is having excessive drowsiness with medication, dose can be lowered to 5 mg nightly. - cetirizine (ZYRTEC) 10 MG tablet; Take 1 tablet (10 mg total) by mouth daily as needed (seasonal allergies).  Dispense: 90 tablet; Refill: 0  Follow Up Instructions: Return for Chronic issues-8 to 10 weeks follow-up with [facility] provider.   I discussed the assessment and treatment plan with the patient. The patient was provided an opportunity to ask questions and all were answered. The patient agreed with the plan and demonstrated an understanding of the instructions.   The patient was advised to call back or seek an in-person evaluation if the symptoms worsen or if the condition fails to improve as anticipated.  I provided 15 minutes of non-face-to-face time during this encounter.   Additional 15 to 20 minutes spent reviewing patient's past records as well as recent visits with oncologist and cardiologist.   Antony Blackbird, MD

## 2019-08-17 ENCOUNTER — Other Ambulatory Visit: Payer: Medicare Other

## 2019-08-17 ENCOUNTER — Other Ambulatory Visit: Payer: Self-pay

## 2019-08-17 DIAGNOSIS — N1832 Chronic kidney disease, stage 3b: Secondary | ICD-10-CM

## 2019-08-17 DIAGNOSIS — N1831 Chronic kidney disease, stage 3a: Secondary | ICD-10-CM

## 2019-08-17 DIAGNOSIS — E1122 Type 2 diabetes mellitus with diabetic chronic kidney disease: Secondary | ICD-10-CM

## 2019-08-17 NOTE — Progress Notes (Signed)
Patient here for labs ordered during recent phone visit.

## 2019-08-18 LAB — BASIC METABOLIC PANEL WITH GFR
BUN/Creatinine Ratio: 15 (ref 10–24)
BUN: 55 mg/dL — ABNORMAL HIGH (ref 8–27)
CO2: 20 mmol/L (ref 20–29)
Calcium: 9.2 mg/dL (ref 8.6–10.2)
Chloride: 104 mmol/L (ref 96–106)
Creatinine, Ser: 3.72 mg/dL — ABNORMAL HIGH (ref 0.76–1.27)
GFR calc Af Amer: 17 mL/min/1.73 — ABNORMAL LOW
GFR calc non Af Amer: 15 mL/min/1.73 — ABNORMAL LOW
Glucose: 77 mg/dL (ref 65–99)
Potassium: 4.5 mmol/L (ref 3.5–5.2)
Sodium: 138 mmol/L (ref 134–144)

## 2019-08-19 ENCOUNTER — Other Ambulatory Visit: Payer: Medicare Other

## 2019-08-19 ENCOUNTER — Ambulatory Visit: Payer: Medicare Other

## 2019-08-19 ENCOUNTER — Ambulatory Visit: Payer: Medicare Other | Admitting: Hematology

## 2019-08-19 ENCOUNTER — Telehealth: Payer: Self-pay | Admitting: Family Medicine

## 2019-08-19 NOTE — Telephone Encounter (Signed)
Pt called to request his lab results. Please follow up when they are available

## 2019-08-20 NOTE — Progress Notes (Signed)
Patient notified of results & recommendations. Expressed understanding.

## 2019-08-23 ENCOUNTER — Ambulatory Visit (HOSPITAL_COMMUNITY)
Admission: EM | Admit: 2019-08-23 | Discharge: 2019-08-23 | Disposition: A | Discharge status: Home / Self Care | Payer: Medicare Other | Attending: Family Medicine | Admitting: Family Medicine

## 2019-08-23 ENCOUNTER — Other Ambulatory Visit: Payer: Self-pay

## 2019-08-23 ENCOUNTER — Encounter (HOSPITAL_COMMUNITY): Payer: Self-pay | Admitting: Emergency Medicine

## 2019-08-23 DIAGNOSIS — Z87891 Personal history of nicotine dependence: Secondary | ICD-10-CM | POA: Insufficient documentation

## 2019-08-23 DIAGNOSIS — N529 Male erectile dysfunction, unspecified: Secondary | ICD-10-CM | POA: Insufficient documentation

## 2019-08-23 DIAGNOSIS — C9000 Multiple myeloma not having achieved remission: Secondary | ICD-10-CM | POA: Diagnosis not present

## 2019-08-23 DIAGNOSIS — Z9049 Acquired absence of other specified parts of digestive tract: Secondary | ICD-10-CM | POA: Insufficient documentation

## 2019-08-23 DIAGNOSIS — Z79899 Other long term (current) drug therapy: Secondary | ICD-10-CM | POA: Diagnosis not present

## 2019-08-23 DIAGNOSIS — Z825 Family history of asthma and other chronic lower respiratory diseases: Secondary | ICD-10-CM | POA: Diagnosis not present

## 2019-08-23 DIAGNOSIS — J4520 Mild intermittent asthma, uncomplicated: Secondary | ICD-10-CM | POA: Insufficient documentation

## 2019-08-23 DIAGNOSIS — Z85528 Personal history of other malignant neoplasm of kidney: Secondary | ICD-10-CM | POA: Insufficient documentation

## 2019-08-23 DIAGNOSIS — I444 Left anterior fascicular block: Secondary | ICD-10-CM | POA: Diagnosis not present

## 2019-08-23 DIAGNOSIS — I491 Atrial premature depolarization: Secondary | ICD-10-CM

## 2019-08-23 DIAGNOSIS — Z880 Allergy status to penicillin: Secondary | ICD-10-CM | POA: Insufficient documentation

## 2019-08-23 DIAGNOSIS — Z8546 Personal history of malignant neoplasm of prostate: Secondary | ICD-10-CM | POA: Diagnosis not present

## 2019-08-23 DIAGNOSIS — I110 Hypertensive heart disease with heart failure: Secondary | ICD-10-CM | POA: Diagnosis not present

## 2019-08-23 DIAGNOSIS — Z8679 Personal history of other diseases of the circulatory system: Secondary | ICD-10-CM | POA: Diagnosis not present

## 2019-08-23 DIAGNOSIS — Z8249 Family history of ischemic heart disease and other diseases of the circulatory system: Secondary | ICD-10-CM | POA: Insufficient documentation

## 2019-08-23 DIAGNOSIS — U071 COVID-19: Secondary | ICD-10-CM | POA: Diagnosis not present

## 2019-08-23 DIAGNOSIS — N401 Enlarged prostate with lower urinary tract symptoms: Secondary | ICD-10-CM | POA: Insufficient documentation

## 2019-08-23 DIAGNOSIS — R002 Palpitations: Secondary | ICD-10-CM | POA: Diagnosis not present

## 2019-08-23 DIAGNOSIS — I2510 Atherosclerotic heart disease of native coronary artery without angina pectoris: Secondary | ICD-10-CM | POA: Diagnosis not present

## 2019-08-23 DIAGNOSIS — I5032 Chronic diastolic (congestive) heart failure: Secondary | ICD-10-CM | POA: Diagnosis not present

## 2019-08-23 DIAGNOSIS — E1122 Type 2 diabetes mellitus with diabetic chronic kidney disease: Secondary | ICD-10-CM | POA: Diagnosis not present

## 2019-08-23 DIAGNOSIS — I480 Paroxysmal atrial fibrillation: Secondary | ICD-10-CM | POA: Diagnosis not present

## 2019-08-23 DIAGNOSIS — Z85038 Personal history of other malignant neoplasm of large intestine: Secondary | ICD-10-CM | POA: Insufficient documentation

## 2019-08-23 DIAGNOSIS — I252 Old myocardial infarction: Secondary | ICD-10-CM | POA: Diagnosis not present

## 2019-08-23 DIAGNOSIS — G4730 Sleep apnea, unspecified: Secondary | ICD-10-CM | POA: Insufficient documentation

## 2019-08-23 DIAGNOSIS — R9431 Abnormal electrocardiogram [ECG] [EKG]: Secondary | ICD-10-CM | POA: Insufficient documentation

## 2019-08-23 LAB — BASIC METABOLIC PANEL
Anion gap: 11 (ref 5–15)
BUN: 51 mg/dL — ABNORMAL HIGH (ref 8–23)
CO2: 22 mmol/L (ref 22–32)
Calcium: 8.7 mg/dL — ABNORMAL LOW (ref 8.9–10.3)
Chloride: 104 mmol/L (ref 98–111)
Creatinine, Ser: 4.1 mg/dL — ABNORMAL HIGH (ref 0.61–1.24)
GFR calc Af Amer: 15 mL/min — ABNORMAL LOW (ref >60)
GFR calc non Af Amer: 13 mL/min — ABNORMAL LOW (ref >60)
Glucose, Bld: 86 mg/dL (ref 70–99)
Potassium: 4.1 mmol/L (ref 3.5–5.1)
Sodium: 137 mmol/L (ref 135–145)

## 2019-08-23 LAB — CBC
HCT: 25.4 % — ABNORMAL LOW (ref 39.0–52.0)
Hemoglobin: 8.5 g/dL — ABNORMAL LOW (ref 13.0–17.0)
MCH: 32.2 pg (ref 26.0–34.0)
MCHC: 33.5 g/dL (ref 30.0–36.0)
MCV: 96.2 fL (ref 80.0–100.0)
Platelets: 195 10*3/uL (ref 150–400)
RBC: 2.64 MIL/uL — ABNORMAL LOW (ref 4.22–5.81)
RDW: 14.1 % (ref 11.5–15.5)
WBC: 4.1 10*3/uL (ref 4.0–10.5)
nRBC: 0 % (ref 0.0–0.2)

## 2019-08-23 LAB — TSH: TSH: 1.852 u[IU]/mL (ref 0.350–4.500)

## 2019-08-23 NOTE — ED Triage Notes (Signed)
Pt says his heart has been beating too fast for him over the last few days.  He states he is normally in the 60's but the last few days it has been in the 80's or 90's.  He denies CP, SOB, dizziness, numbness, tingling or headache.  Pt states he feels dehydrated and dry.

## 2019-08-23 NOTE — ED Notes (Signed)
Patient reports for 2 days heart rate has been higher than usual.  Patient reports heart rate is usually 68.  Reports it is running in 80"s.  Obtained heart rate of 86 on pulse ox

## 2019-08-23 NOTE — Discharge Instructions (Addendum)
Home to rest Drink more water I will call you with your blood test result tonight Call your heart doctor tomorrow to tell them about heart rate  You will be called if the COVID test is positive

## 2019-08-23 NOTE — ED Provider Notes (Signed)
[facility]    CSN: [id number] Arrival date & time: 08/23/19  [place]      History   Chief Complaint Chief Complaint  Patient presents with  . Tachycardia    HPI Ransom A Hellard is a 73 y.o. male.   HPI  Here for increased heart rate States for the last 2-3 days it has been higher than usual He has felt a little dizzy It makes him anxious No CP or pressure, no SOB or change in breathing No fever or chills, body aches or headache Appetite is reduced Not drinking as much Feels a little dry Always has loose BMs Is incontinent so does not know if urine decreased, does not think so.  Color unchanged. Does not drink caffeine No change in usual medications, no supplements or vitamins He worries his blood count is low Needed a transfusion last month PMH reviewed, last EKG and echo and cardiology notes read Denies possibility of COVID but the kids and grandkids came to his house several days ago, does not wear mask int he house   Past Medical History:  Diagnosis Date  . Abnormal CT of liver    a. nodular contour suggesting cirrhosis 05/2018.  [name] Abnormal LFTs (liver function tests)   . Anxiety   . Aortic root dilatation ([place])   . Ascending aortic aneurysm ([place])   . BPH with urinary obstruction   . Bradycardia   . 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 CAD (coronary artery disease)    a. 12/2015 NSTEMI/Cath (in setting of PAF):  LM nl, LAD 30p,  LCX 52m, RCA  ok, AM 100%, RPDA1 40, RPDA 2 60 ->Med Rx.  . Childhood asthma   . Chronic diastolic CHF (congestive heart failure) ([place])    a. 12/2015 Echo: EF 50-55%, mod AI, mod Ao root dil, mild MR, mod dil LA, mod RA.  . CKD (chronic kidney disease), stage III   . Colon cancer ([place])   . COPD (chronic obstructive pulmonary disease) ([place])    pt denies at preop  . Diabetes mellitus type II    diet controlled  . History of syncope   .  Hyperlipidemia   . Hypertensive heart disease   . Kidney lump 04/04/2010   Overview:  Renal Cell Carcinoma   . 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 Moderate aortic insufficiency    a. 12/2015 Echo: Mod AI.  [name] Paroxysmal atrial fibrillation ([place])    a. 12/2015 started on Xarelto (CHA2DS2VASc = 4-5).  . Pneumonia   . Prostate cancer ([place])   . Renal cell carcinoma ([place])   . Sleep apnea    Does not like  CPAP  . Spinal stenosis in cervical region 10/12/2016  . Venous insufficiency     Patient Active Problem List   Diagnosis Date Noted  . Educated about COVID-19 virus infection 08/04/2019  . Ascending aortic aneurysm ([place]) 08/04/2019  . Myeloma kidney ([place])   . Acute renal failure superimposed on stage 3 chronic kidney disease ([place]) 06/04/2019  . CKD (chronic kidney disease), stage III 06/04/2019  . Renal cell carcinoma ([place]) 06/04/2019  . Gross hematuria 06/04/2019  . COPD exacerbation ([place]) 11/05/2018  . Palpitations 10/22/2018  . Counseling regarding advance care planning and goals of care 06/22/2018  . AKI (acute kidney injury) ([place])   . Anemia   . Sepsis ([place]) 06/05/2018  . Acute lower UTI 06/05/2018  .  Colon polyp 05/21/2018  . Aortic valve regurgitation 04/14/2018  . Medication management 04/14/2018  . Thoracic aortic aneurysm without rupture ([place]) 09/29/2017  . 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 Head trauma, subsequent encounter 10/12/2016  . 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 Spinal stenosis in cervical region 10/12/2016  . Cervical spondylosis 10/12/2016  . Syncope and collapse 10/12/2016  . Fall   . Syncope 10/11/2016  . CAD (coronary artery disease)   . Chronic diastolic CHF (congestive heart failure) ([place])   . Hyperlipidemia   . Hypertensive heart disease   . Paroxysmal atrial fibrillation ([place]) 12/17/2015  . 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 Mild intermittent  asthma 10/18/2015  . Aortic root dilatation ([place]) 10/17/2015  . Diabetes ([place]) 10/17/2015  . 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 Hand paresthesia 12/09/2014  . Elevated prostate specific antigen (PSA) 07/12/2014  . Allergic rhinitis 07/12/2014  . LBP (low back pain) 07/12/2014  . Palpitation 07/11/2014  . Patient's other noncompliance with medication regimen 08/10/2013  . Chronic venous insufficiency 07/23/2012  . Obstructive apnea 04/11/2011  . Essential (primary) hypertension 09/07/2010  . 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 Anxiety, generalized 04/04/2010  . Cardiac conduction disorder 02/27/2010  . BPH (benign prostatic hyperplasia) 09/13/2009  . Abnormal findings on examination of genitourinary organs 08/09/2009  . Hereditary and idiopathic neuropathy 07/05/2009    Past Surgical History:  Procedure Laterality Date  . BACK SURGERY    . CARDIAC CATHETERIZATION N/A 12/18/2015   Procedure: Left Heart Cath and Coronary Angiography;  Surgeon: Leonie Man, MD;  Location: [facility];  Service: Cardiovascular;  Laterality: N/A;  . DIAGNOSTIC LAPAROSCOPY     partial colectomy  . IR FLUORO GUIDE CV LINE RIGHT  06/28/2019  . IR REMOVAL TUN CV CATH W/O FL  07/26/2019  . IR US GUIDE VASC ACCESS RIGHT  06/28/2019  . LAPAROSCOPIC PARTIAL COLECTOMY Right 05/21/2018   Procedure: LAPAROSCOPIC RIGHT  COLECTOMY ERAS PATHWAY;  Surgeon: Leighton Ruff, MD;  Location: [facility];  Service: General;  Laterality: Right;  . [place]   "replaced a disc"       Home Medications    Prior to Admission medications   Medication Sig Start Date End Date Taking? Authorizing Provider  amLODipine (NORVASC) 10 MG tablet Take 1 tablet (10 mg total) by mouth daily. 08/16/19  Yes Fulp, Cammie, MD  cetirizine (ZYRTEC) 10 MG tablet Take 1 tablet (10 mg total) by mouth daily as needed (seasonal allergies). 08/16/19  Yes Fulp, Cammie, MD    glucose blood (ONETOUCH ULTRA) test strip Check FSBS twice a day. Dx: E11.21, N18.32 08/16/19  Yes Fulp, Cammie, MD  tamsulosin (FLOMAX) 0.4 MG CAPS capsule Take 0.4 mg by mouth daily.   Yes [provider]  acyclovir (ZOVIRAX) 200 MG capsule Take 1 capsule (200 mg total) by mouth 2 (two) times daily. 07/09/19   Brunetta Genera, MD  fluticasone (FLONASE) 50 MCG/ACT nasal spray Place 2 sprays into both nostrils daily as needed (seasonal allergies).  08/18/18   [provider]    Family History Family History  Problem Relation Age of Onset  . Emphysema Father   . Asthma Father   . Liver disease Father        tumor  . Heart disease Mother   . Hypertension Mother   . Asthma  Sister   . Hypertension Son     Social History Social History   Tobacco Use  . Smoking status: Former Smoker    Types: Cigars    Quit date: 09/02/1977    Years since quitting: 42.0  . Smokeless tobacco: Never Used  . Tobacco comment:    Substance Use Topics  . Alcohol use: Not Currently    Comment: "stopped drinking alcohol in ~ 1980; just drank a little on the weekends when I did drink"  . Drug use: No     Allergies   Amoxicillin and Penicillins   Review of Systems Review of Systems  Constitutional: Negative for chills and fever.  HENT: Negative for congestion and hearing loss.   Eyes: Negative for pain.  Respiratory: Negative for cough and shortness of breath.   Cardiovascular: Positive for palpitations. Negative for chest pain and leg swelling.       Spells of fast heart rate  Gastrointestinal: Negative for abdominal pain, constipation and diarrhea.  Genitourinary: Negative for dysuria and frequency.  Musculoskeletal: Negative for myalgias.  Neurological: Positive for dizziness. Negative for seizures and headaches.       With fast heart  Psychiatric/Behavioral: The patient is not nervous/anxious.      Physical Exam Triage Vital Signs ED Triage Vitals  Enc Vitals  Group     BP 08/23/19 1638 140/81     Pulse Rate 08/23/19 1603 82     Resp 08/23/19 1638 (!) 24     Temp 08/23/19 1638 98.2 F (36.8 C)     Temp Source 08/23/19 1638 Oral     SpO2 08/23/19 1603 96 %     Weight --      Height --      Head Circumference --      Peak Flow --      Pain Score 08/23/19 1640 0     Pain Loc --      Pain Edu? --      Excl. in [place]? --    No data found.  Updated Vital Signs BP 140/81 (BP Location: Left Arm)   Pulse 84   Temp 98.2 F (36.8 C) (Oral)   Resp (!) 24   SpO2 100%      Physical Exam Constitutional:      General: He is not in acute distress.    Appearance: He is well-developed.  HENT:     Head: Normocephalic and atraumatic.     Nose: Nose normal.     Mouth/Throat:     Mouth: Mucous membranes are moist.     Pharynx: No posterior oropharyngeal erythema.  Eyes:     Conjunctiva/sclera: Conjunctivae normal.     Pupils: Pupils are equal, round, and reactive to light.  Cardiovascular:     Rate and Rhythm: Normal rate and regular rhythm.     Comments: Frequent ectopy Pulmonary:     Effort: Pulmonary effort is normal. No respiratory distress.     Breath sounds: Normal breath sounds.  Abdominal:     General: Abdomen is flat. There is no distension.     Palpations: Abdomen is soft.     Comments: Ventral hernia  Musculoskeletal:        General: Normal range of motion.     Cervical back: Normal range of motion and neck supple.     Right lower leg: Edema present.     Left lower leg: Edema present.     Comments: Trace edema  Skin:  General: Skin is warm and dry.  Neurological:     General: No focal deficit present.     Mental Status: He is alert.     Gait: Gait normal.  Psychiatric:        Mood and Affect: Mood normal.        Behavior: Behavior normal.      UC Treatments / Results  Labs (all labs ordered are listed, but only abnormal results are displayed) Labs Reviewed  NOVEL CORONAVIRUS, NAA (HOSP ORDER, SEND-OUT TO REF  LAB; TAT 18-24 HRS)  CBC  BASIC METABOLIC PANEL  TSH    EKG- rate 74, since last EKG new PACs noted. No ST or T wave changes, normal intervals   Radiology No results found.  Procedures Procedures (including critical care time)  Medications Ordered in UC Medications - No data to display  Initial Impression / Assessment and Plan / UC Course  I have reviewed the triage vital signs and the nursing notes.  Pertinent labs & imaging results that were available during my care of the patient were reviewed by me and considered in my medical decision making (see chart for details).     Discussed that spells of tachycardia can be any number of things, he has had A fib in the past. Needs Cardiology follow up Will check labs and call him To ER if worse , especially if symptomatic Final Clinical Impressions(s) / UC Diagnoses   Final diagnoses:  Palpitations  Premature atrial contractions     Discharge Instructions     Home to rest Drink more water I will call you with your blood test result tonight Call your heart doctor tomorrow to tell them about heart rate  You will be called if the COVID test is positive   ED Prescriptions    None     PDMP not reviewed this encounter.   Raylene Everts, MD 08/23/19 [phone]

## 2019-08-24 ENCOUNTER — Telehealth: Payer: Self-pay | Admitting: Cardiology

## 2019-08-24 NOTE — Progress Notes (Signed)
Discussed the CBC, BMP and TSH with patient.  He feels somewhat better today.  Reminded to call his cardiologist

## 2019-08-24 NOTE — Telephone Encounter (Signed)
    Patient calling to report elevated HR, weakness and chest "pounding".  STAT if HR is under 50 or over 120 (normal HR is 60-100 beats per minute)  1) What is your heart rate? 80-->90  2) Do you have a log of your heart rate readings (document readings)? 80-->90  3) Do you have any other symptoms? Patient states he feels weak and feels like his chest is pounding. Stating his HR is usually much lower.

## 2019-08-24 NOTE — Telephone Encounter (Signed)
Spoke with pt, he has an appointment tomorrow with the NP. He is feeling bad and feels like he needs a blood transfusion because that is what is causing his heart to pound. He does not want to go back to the cancer center because the treatments made him feel worse. He is going to try to increase his fluids and eat more. He is aware of the appt tomorrow.

## 2019-08-25 ENCOUNTER — Ambulatory Visit: Payer: Medicare Other | Admitting: Adult Health

## 2019-08-25 ENCOUNTER — Telehealth: Payer: Self-pay | Admitting: Emergency Medicine

## 2019-08-25 ENCOUNTER — Other Ambulatory Visit: Payer: Self-pay

## 2019-08-25 LAB — NOVEL CORONAVIRUS, NAA (HOSP ORDER, SEND-OUT TO REF LAB; TAT 18-24 HRS): SARS-CoV-2, NAA: DETECTED — AB

## 2019-08-25 NOTE — Progress Notes (Deleted)
Cardiology Office Note   Date:  08/25/2019   ID:  BRAVLIO LUCA, DOB 11-Apr-1946, MRN [id number]  PCP:  Scot Jun, FNP  Cardiologist:  Dr.Hochrein  No chief complaint on file.    History of Present Illness: [name] [name] is a 73 y.o. male who presents for ongoing assessment and management of CAD and palpitations, hyperlipidemia,  He has history of multiple myeloma and renal cell cancer.He has chosen not to pursue oncology treatment or participate in Hospice. He has CKD and COPD.  He was last seen by Dr, Percival Spanish on 08/05/2019. On his assessment, Dr. Percival Spanish found no evidence of PAF, is was a very high risk for anticoagulation therapy. He was euvolemic.  Unfortunately, he was seen at Urgent Care on 08/23/2019 for complaints of rapid HR. EKG revealed SR with PAC's rate of 74 bpm. Creatinine was elevated at 4.10. He admitted to not eating and drinking very well over the last few weeks.   Past Medical History:  Diagnosis Date  . Abnormal CT of liver    a. nodular contour suggesting cirrhosis 05/2018.  [name] Abnormal LFTs (liver function tests)   . Anxiety   . Aortic root dilatation ([place])   . Ascending aortic aneurysm ([place])   . BPH with urinary obstruction   . Bradycardia   . 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 CAD (coronary artery disease)    a. 12/2015 NSTEMI/Cath (in setting of PAF):  LM nl, LAD 30p,  LCX 79m RCA  ok, AM 100%, RPDA1 40, RPDA 2 60 ->Med Rx.  . Childhood asthma   . Chronic diastolic CHF (congestive heart failure) ([place]    a. 12/2015 Echo: EF 50-55%, mod AI, mod Ao root dil, mild MR, mod dil LA, mod RA.  . CKD (chronic kidney disease), stage III   . Colon cancer ([place]   . COPD (chronic obstructive pulmonary disease) ([place]    pt denies at preop  . Diabetes mellitus type II    diet controlled  . History of syncope   . Hyperlipidemia   . Hypertensive heart disease   . Kidney lump  04/04/2010   Overview:  Renal Cell Carcinoma   . Light chain myeloma ([place]   . Moderate aortic insufficiency    a. 12/2015 Echo: Mod AI.  .Marland KitchenParoxysmal atrial fibrillation ([place]    a. 12/2015 started on Xarelto (CHA2DS2VASc = 4-5).  . Pneumonia   . Prostate cancer ([place]   . Renal cell carcinoma ([place]   . Sleep apnea    Does not like  CPAP  . Spinal stenosis in cervical region 10/12/2016  . Venous insufficiency     Past Surgical History:  Procedure Laterality Date  . BACK SURGERY    . CARDIAC CATHETERIZATION N/A 12/18/2015   Procedure: Left Heart Cath and Coronary Angiography;  Surgeon: DLeonie Man MD;  [facility];  Service: Cardiovascular;  Laterality: N/A;  . DIAGNOSTIC LAPAROSCOPY     partial colectomy  . IR FLUORO GUIDE CV LINE RIGHT  06/28/2019  . IR REMOVAL TUN CV CATH W/O FL  07/26/2019  . IR [place] ACC[place] RIGHT  06/28/2019  . LAPAROSCOPIC PARTIAL COLECTOMY Right 05/21/2018   Procedure: LAPAROSCOPIC RIGHT  COLECTOMY ERAS PATHWAY;  Surgeon: TLeighton Ruff MD;  Location[facility];  Service: General;  Laterality: Right;  . [place]  "replaced a disc"  Current Outpatient Medications  Medication Sig Dispense Refill  . acyclovir (ZOVIRAX) 200 MG capsule Take 1 capsule (200 mg total) by mouth 2 (two) times daily. 60 capsule 0  . amLODipine (NORVASC) 10 MG tablet Take 1 tablet (10 mg total) by mouth daily. 90 tablet 0  . cetirizine (ZYRTEC) 10 MG tablet Take 1 tablet (10 mg total) by mouth daily as needed (seasonal allergies). 90 tablet 0  . fluticasone (FLONASE) 50 MCG/ACT nasal spray Place 2 sprays into both nostrils daily as needed (seasonal allergies).     [name] glucose blood (ONETOUCH ULTRA) test strip Check FSBS twice a day. Dx: E11.21, N18.32 100 each 3  . tamsulosin (FLOMAX) 0.4 MG CAPS capsule Take 0.4 mg by mouth daily.     No current facility-administered medications for this visit.    Allergies:   Amoxicillin and Penicillins     Social History:  The patient  reports that he quit smoking about 42 years ago. His smoking use included cigars. He has never used smokeless tobacco. He reports previous alcohol use. He reports that he does not use drugs.   Family History:  The patient's family history includes Asthma in his father and sister; Emphysema in his father; Heart disease in his mother; Hypertension in his mother and son; Liver disease in his father.    ROS: All other systems are reviewed and negative. Unless otherwise mentioned in H&P    PHYSICAL EXAM: VS:  There were no vitals taken for this visit. , BMI There is no height or weight on file to calculate BMI. GEN: Well nourished, well developed, in no acute distress HEENT: normal Neck: no JVD, carotid bruits, or masses Cardiac: ***RRR; no murmurs, rubs, or gallops,no edema  Respiratory:  Clear to auscultation bilaterally, normal work of breathing GI: soft, nontender, nondistended, + BS MS: no deformity or atrophy Skin: warm and dry, no rash Neuro:  Strength and sensation are intact Psych: euthymic mood, full affect   EKG:  EKG {ACTION; IS/IS HDQ:[id number]} ordered today. The ekg ordered today demonstrates ***   Recent Labs: 09/09/2018: B Natriuretic Peptide 36.2 07/22/2019: ALT 28 08/23/2019: BUN 51; Creatinine, Ser 4.10; Hemoglobin 8.5; Platelets 195; Potassium 4.1; Sodium 137; TSH 1.852    Lipid Panel    Component Value Date/Time   CHOL 199 09/30/2017 1042   TRIG 109 09/30/2017 1042   HDL 56 09/30/2017 1042   CHOLHDL 3.6 09/30/2017 1042   CHOLHDL 3.3 10/11/2016 1900   VLDL 27 10/11/2016 1900   LDLCALC 121 (H) 09/30/2017 1042      Wt Readings from Last 3 Encounters:  08/05/19 210 lb 12.8 oz (95.6 kg)  07/22/19 213 lb 8 oz (96.8 kg)  07/08/19 213 lb 12.8 oz (97 kg)      Other studies Reviewed: Echocardiogram 08-26-19 1. Left ventricular ejection fraction, by visual estimation, is 55 to 60%. The left ventricle has low normal  function. There is moderately increased left ventricular hypertrophy.  2. Left ventricular diastolic parameters are consistent with Grade I diastolic dysfunction (impaired relaxation).  3. Mild to moderately dilated left ventricular internal cavity size.  4. The left ventricle has no regional wall motion abnormalities.  5. Global right ventricle has normal systolic function.The right ventricular size is normal. No increase in right ventricular wall thickness.  6. Left atrial size was severely dilated.  7. Right atrial size was severely dilated.  8. Mild mitral annular calcification.  9. The mitral valve is degenerative. Mild to moderate mitral valve regurgitation. 10.  The tricuspid valve is normal in structure. Tricuspid valve regurgitation is mild. 11. Aortic valve regurgitation is moderate. 12. The aortic valve is tricuspid. Aortic valve regurgitation is moderate. Mild aortic valve sclerosis without stenosis. 13. The pulmonic valve was grossly normal. Pulmonic valve regurgitation is trivial. 14. Aneurysm of the ascending aorta and aortic root. 15. Aortic dilatation noted. 16. There is mild dilatation of the aortic root and of the ascending aorta measuring 48 mm. 17. Mildly elevated pulmonary artery systolic pressure. 18. The tricuspid regurgitant velocity is 2.31 m/s, and with an assumed right atrial pressure of 15 mmHg, the estimated right ventricular systolic pressure is mildly elevated at 36.3 mmHg. 19. The inferior vena cava is dilated in size with <50% respiratory variability, suggesting right atrial pressure of 15 mmHg. 20. There is right bowing of the interatrial septum, suggestive of elevated left atrial pressure.  ASSESSMENT AND PLAN:  1.  ***   Current medicines are reviewed at length with the patient today.    Labs/ tests ordered today include: *** Phill Myron. West Pugh, ANP, AACC   08/25/2019 8:06 AM    [facility] [place] [address] Office [phone] Fax [phone]  Notice: This dictation was prepared with Dragon dictation along with smaller phrase technology. Any transcriptional errors that result from this process are unintentional and may not be corrected upon review.

## 2019-08-25 NOTE — Telephone Encounter (Signed)
Your test for COVID-19 was positive, meaning that you were infected with the novel coronavirus and could give the germ to others.  Please continue isolation at home for at least 10 days since the start of your symptoms. If you do not have symptoms, please isolate at home for 10 days from the day you were tested. Once you complete your 10 day quarantine, you may return to normal activities as long as you've not had a fever for over 24 hours(without taking fever reducing medicine) and your symptoms are improving. Please continue good preventive care measures, including:  frequent hand-washing, avoid touching your face, cover coughs/sneezes, stay out of crowds and keep a 6 foot distance from others.  Go to the nearest hospital emergency room if fever/cough/breathlessness are severe or illness seems like a threat to life.  Patient contacted by phone and made aware of    results. Pt verbalized understanding and had all questions answered.    

## 2019-09-07 ENCOUNTER — Ambulatory Visit (HOSPITAL_COMMUNITY)
Admission: EM | Admit: 2019-09-07 | Discharge: 2019-09-07 | Disposition: A | Discharge status: Home / Self Care | Payer: Medicare Other

## 2019-09-07 ENCOUNTER — Telehealth: Payer: Self-pay | Admitting: Emergency Medicine

## 2019-09-07 ENCOUNTER — Other Ambulatory Visit: Payer: Self-pay

## 2019-09-07 NOTE — Telephone Encounter (Signed)
Pt called asking about getting retested. Informed him no retesting for covid, states hes finished his quarantine and feels better. Pt encouraged to follow up with PCP if he needs recheck of blood work, and if unable he can return here for recheck of blood work. Pt verbalized understanding, all questions answered.

## 2019-09-07 NOTE — ED Notes (Addendum)
Patient wanting blood work drawn to see what it is now.  Patient thinks his hemoglobin is low, patient has a variety of complaints .  Stressed the importance of discussing this with primary doctor.  Dr Meda Coffee aware

## 2019-09-08 ENCOUNTER — Ambulatory Visit (INDEPENDENT_AMBULATORY_CARE_PROVIDER_SITE_OTHER)
Admission: EM | Admit: 2019-09-08 | Discharge: 2019-09-08 | Disposition: A | Discharge status: Home / Self Care | Payer: Medicare Other | Source: Home / Self Care

## 2019-09-08 ENCOUNTER — Encounter: Payer: Self-pay | Admitting: Emergency Medicine

## 2019-09-08 ENCOUNTER — Encounter (HOSPITAL_COMMUNITY): Payer: Self-pay

## 2019-09-08 ENCOUNTER — Emergency Department (HOSPITAL_COMMUNITY)
Admission: EM | Admit: 2019-09-08 | Discharge: 2019-09-09 | Disposition: A | Discharge status: Home / Self Care | Payer: Medicare Other | Attending: Emergency Medicine | Admitting: Emergency Medicine

## 2019-09-08 ENCOUNTER — Other Ambulatory Visit: Payer: Self-pay

## 2019-09-08 DIAGNOSIS — N183 Chronic kidney disease, stage 3 unspecified: Secondary | ICD-10-CM | POA: Insufficient documentation

## 2019-09-08 DIAGNOSIS — F17290 Nicotine dependence, other tobacco product, uncomplicated: Secondary | ICD-10-CM | POA: Diagnosis not present

## 2019-09-08 DIAGNOSIS — D649 Anemia, unspecified: Secondary | ICD-10-CM

## 2019-09-08 DIAGNOSIS — R5382 Chronic fatigue, unspecified: Secondary | ICD-10-CM | POA: Diagnosis not present

## 2019-09-08 DIAGNOSIS — Z79899 Other long term (current) drug therapy: Secondary | ICD-10-CM | POA: Diagnosis not present

## 2019-09-08 DIAGNOSIS — E1122 Type 2 diabetes mellitus with diabetic chronic kidney disease: Secondary | ICD-10-CM | POA: Diagnosis not present

## 2019-09-08 DIAGNOSIS — R7989 Other specified abnormal findings of blood chemistry: Secondary | ICD-10-CM | POA: Insufficient documentation

## 2019-09-08 DIAGNOSIS — R531 Weakness: Secondary | ICD-10-CM | POA: Diagnosis present

## 2019-09-08 DIAGNOSIS — I5032 Chronic diastolic (congestive) heart failure: Secondary | ICD-10-CM | POA: Insufficient documentation

## 2019-09-08 DIAGNOSIS — I130 Hypertensive heart and chronic kidney disease with heart failure and stage 1 through stage 4 chronic kidney disease, or unspecified chronic kidney disease: Secondary | ICD-10-CM | POA: Insufficient documentation

## 2019-09-08 LAB — URINALYSIS, ROUTINE W REFLEX MICROSCOPIC
Bilirubin Urine: NEGATIVE
Glucose, UA: NEGATIVE mg/dL
Hgb urine dipstick: NEGATIVE
Ketones, ur: NEGATIVE mg/dL
Leukocytes,Ua: NEGATIVE
Nitrite: NEGATIVE
Protein, ur: 100 mg/dL — AB
Specific Gravity, Urine: 1.011 (ref 1.005–1.030)
pH: 6 (ref 5.0–8.0)

## 2019-09-08 LAB — BASIC METABOLIC PANEL
Anion gap: 9 (ref 5–15)
BUN: 54 mg/dL — ABNORMAL HIGH (ref 8–23)
CO2: 21 mmol/L — ABNORMAL LOW (ref 22–32)
Calcium: 8.9 mg/dL (ref 8.9–10.3)
Chloride: 108 mmol/L (ref 98–111)
Creatinine, Ser: 3.82 mg/dL — ABNORMAL HIGH (ref 0.61–1.24)
GFR calc Af Amer: 17 mL/min — ABNORMAL LOW (ref >60)
GFR calc non Af Amer: 15 mL/min — ABNORMAL LOW (ref >60)
Glucose, Bld: 108 mg/dL — ABNORMAL HIGH (ref 70–99)
Potassium: 3.8 mmol/L (ref 3.5–5.1)
Sodium: 138 mmol/L (ref 135–145)

## 2019-09-08 LAB — PREPARE RBC (CROSSMATCH)

## 2019-09-08 LAB — CBC
HCT: 22.5 % — ABNORMAL LOW (ref 39.0–52.0)
Hemoglobin: 7.3 g/dL — ABNORMAL LOW (ref 13.0–17.0)
MCH: 31.6 pg (ref 26.0–34.0)
MCHC: 32.4 g/dL (ref 30.0–36.0)
MCV: 97.4 fL (ref 80.0–100.0)
Platelets: 242 10*3/uL (ref 150–400)
RBC: 2.31 MIL/uL — ABNORMAL LOW (ref 4.22–5.81)
RDW: 14.8 % (ref 11.5–15.5)
WBC: 5.3 10*3/uL (ref 4.0–10.5)
nRBC: 0 % (ref 0.0–0.2)

## 2019-09-08 LAB — CBG MONITORING, ED: Glucose-Capillary: 82 mg/dL (ref 70–99)

## 2019-09-08 MED ORDER — SODIUM CHLORIDE 0.9% FLUSH: 3.0000 mL | Freq: Once | INTRAVENOUS | Status: DC

## 2019-09-08 MED ORDER — SODIUM CHLORIDE 0.9% IV SOLUTION: Freq: Once | INTRAVENOUS | Status: AC

## 2019-09-08 NOTE — ED Provider Notes (Signed)
[facility]PART[facility] Provider Note   CSN: [id number] Arrival date & time: 09/08/19  1319     History Chief Complaint  Patient presents with  . Weakness    [name] [name] is a 74 y.o. male.  Chief complaint weakness for 1 month.  Multiple chronic health problems including stage III kidney disease and resulting anemia.  He has required a transfusion in the past.  Review of systems positive for prn tachycardia.  Hemoglobin 08/23/2019 8.5 with associated Covid positivity.  No chest pain, dyspnea, fever, sweats, chills.  Severity is mild to moderate.  Exertion makes symptoms worse.        Past Medical History:  Diagnosis Date  . Abnormal CT of liver    a. nodular contour suggesting cirrhosis 05/2018.  [name] Abnormal LFTs (liver function tests)   . Anxiety   . Aortic root dilatation ([place])   . Ascending aortic aneurysm ([place])   . BPH with urinary obstruction   . Bradycardia   . 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 CAD (coronary artery disease)    a. 12/2015 NSTEMI/Cath (in setting of PAF):  LM nl, LAD 30p,  LCX 71m, RCA  ok, AM 100%, RPDA1 40, RPDA 2 60 ->Med Rx.  . Childhood asthma   . Chronic diastolic CHF (congestive heart failure) ([place])    a. 12/2015 Echo: EF 50-55%, mod AI, mod Ao root dil, mild MR, mod dil LA, mod RA.  . CKD (chronic kidney disease), stage III   . Colon cancer ([place])   . COPD (chronic obstructive pulmonary disease) ([place])    pt denies at preop  . Diabetes mellitus type II    diet controlled  . History of syncope   . Hyperlipidemia   . Hypertensive heart disease   . Kidney lump 04/04/2010   Overview:  Renal Cell Carcinoma   . 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 Moderate aortic insufficiency    a. 12/2015 Echo: Mod AI.  [name] Paroxysmal atrial fibrillation ([place])    a. 12/2015 started on Xarelto (CHA2DS2VASc = 4-5).  . Pneumonia   . Prostate cancer ([place])   .  Renal cell carcinoma ([place])   . Sleep apnea    Does not like  CPAP  . Spinal stenosis in cervical region 10/12/2016  . Venous insufficiency     Patient Active Problem List   Diagnosis Date Noted  . Educated about COVID-19 virus infection 08/04/2019  . Ascending aortic aneurysm ([place]) 08/04/2019  . 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 Acute renal failure superimposed on stage 3 chronic kidney disease ([place]) 06/04/2019  . CKD (chronic kidney disease), stage III 06/04/2019  . Renal cell carcinoma ([place]) 06/04/2019  . Gross hematuria 06/04/2019  . COPD exacerbation ([place]) 11/05/2018  . Palpitations 10/22/2018  . Counseling regarding advance care planning and goals of care 06/22/2018  . AKI (acute kidney injury) ([place])   . Anemia   . Sepsis ([place]) 06/05/2018  . Acute lower UTI 06/05/2018  . Colon polyp 05/21/2018  . Aortic valve regurgitation 04/14/2018  . Medication management 04/14/2018  . Thoracic aortic aneurysm without rupture ([place]) 09/29/2017  . 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 Head trauma, subsequent encounter 10/12/2016  . 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 Spinal stenosis in cervical region 10/12/2016  . Cervical spondylosis 10/12/2016  .  Syncope and collapse 10/12/2016  . Fall   . Syncope 10/11/2016  . CAD (coronary artery disease)   . Chronic diastolic CHF (congestive heart failure) ([place])   . Hyperlipidemia   . Hypertensive heart disease   . Paroxysmal atrial fibrillation ([place]) 12/17/2015  . 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 Mild intermittent asthma 10/18/2015  . Aortic root dilatation ([place]) 10/17/2015  . Diabetes ([place]) 10/17/2015  . 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 Hand paresthesia 12/09/2014  . Elevated prostate specific antigen (PSA) 07/12/2014  . Allergic rhinitis 07/12/2014  . LBP (low back pain) 07/12/2014  . Palpitation 07/11/2014   . Patient's other noncompliance with medication regimen 08/10/2013  . Chronic venous insufficiency 07/23/2012  . Obstructive apnea 04/11/2011  . Essential (primary) hypertension 09/07/2010  . 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 Anxiety, generalized 04/04/2010  . Cardiac conduction disorder 02/27/2010  . BPH (benign prostatic hyperplasia) 09/13/2009  . Abnormal findings on examination of genitourinary organs 08/09/2009  . Hereditary and idiopathic neuropathy 07/05/2009    Past Surgical History:  Procedure Laterality Date  . BACK SURGERY    . CARDIAC CATHETERIZATION N/A 12/18/2015   Procedure: Left Heart Cath and Coronary Angiography;  Surgeon: Leonie Man, MD;  Location: [facility];  Service: Cardiovascular;  Laterality: N/A;  . DIAGNOSTIC LAPAROSCOPY     partial colectomy  . IR FLUORO GUIDE CV LINE RIGHT  06/28/2019  . IR REMOVAL TUN CV CATH W/O FL  07/26/2019  . IR US GUIDE VASC ACCESS RIGHT  06/28/2019  . LAPAROSCOPIC PARTIAL COLECTOMY Right 05/21/2018   Procedure: LAPAROSCOPIC RIGHT  COLECTOMY ERAS PATHWAY;  Surgeon: Leighton Ruff, MD;  Location: [facility];  Service: General;  Laterality: Right;  . [place]   "replaced a disc"       Family History  Problem Relation Age of Onset  . Emphysema Father   . Asthma Father   . Liver disease Father        tumor  . Heart disease Mother   . Hypertension Mother   . Asthma Sister   . Hypertension Son     Social History   Tobacco Use  . Smoking status: Former Smoker    Types: Cigars    Quit date: 09/02/1977    Years since quitting: 42.0  . Smokeless tobacco: Never Used  . Tobacco comment:    Substance Use Topics  . Alcohol use: Not Currently    Comment: "stopped drinking alcohol in ~ 1980; just drank a little on the weekends when I did drink"  . Drug use: No    Home Medications Prior to Admission medications   Medication Sig Start Date End Date Taking? Authorizing Provider   acyclovir (ZOVIRAX) 200 MG capsule Take 1 capsule (200 mg total) by mouth 2 (two) times daily. 07/09/19   Brunetta Genera, MD  amLODipine (NORVASC) 10 MG tablet Take 1 tablet (10 mg total) by mouth daily. 08/16/19   Fulp, Cammie, MD  cetirizine (ZYRTEC) 10 MG tablet Take 1 tablet (10 mg total) by mouth daily as needed (seasonal allergies). 08/16/19   Fulp, Cammie, MD  fluticasone (FLONASE) 50 MCG/ACT nasal spray Place 2 sprays into both nostrils daily as needed (seasonal allergies).  08/18/18   [provider]  glucose blood (ONETOUCH ULTRA) test strip Check FSBS twice a day. Dx: E11.21, N18.32 08/16/19   Fulp, Ander Gaster, MD  tamsulosin (FLOMAX) 0.4 MG CAPS capsule Take 0.4 mg  by mouth daily.    [provider]    Allergies    Amoxicillin and Penicillins  Review of Systems   Review of Systems  All other systems reviewed and are negative.   Physical Exam Updated Vital Signs BP (!) 151/97   Pulse 80   Temp 98.7 F (37.1 C) (Oral)   Resp 19   SpO2 100%   Physical Exam Vitals and nursing note reviewed.  Constitutional:      Appearance: He is well-developed.     Comments: nad  HENT:     Head: Normocephalic and atraumatic.  Eyes:     Conjunctiva/sclera: Conjunctivae normal.  Cardiovascular:     Rate and Rhythm: Normal rate and regular rhythm.  Pulmonary:     Effort: Pulmonary effort is normal.     Breath sounds: Normal breath sounds.  Abdominal:     General: Bowel sounds are normal.     Palpations: Abdomen is soft.  Musculoskeletal:        General: Normal range of motion.     Cervical back: Neck supple.  Skin:    General: Skin is warm and dry.     Comments: pale  Neurological:     General: No focal deficit present.     Mental Status: He is alert and oriented to person, place, and time.  Psychiatric:        Behavior: Behavior normal.     ED Results / Procedures / Treatments   Labs (all labs ordered are listed, but only abnormal results are  displayed) Labs Reviewed  BASIC METABOLIC PANEL - Abnormal; Notable for the following components:      Result Value   CO2 21 (*)    Glucose, Bld 108 (*)    BUN 54 (*)    Creatinine, Ser 3.82 (*)    GFR calc non Af Amer 15 (*)    GFR calc Af Amer 17 (*)    All other components within normal limits  CBC - Abnormal; Notable for the following components:   RBC 2.31 (*)    Hemoglobin 7.3 (*)    HCT 22.5 (*)    All other components within normal limits  URINALYSIS, ROUTINE W REFLEX MICROSCOPIC - Abnormal; Notable for the following components:   Color, Urine STRAW (*)    Protein, ur 100 (*)    Bacteria, UA RARE (*)    All other components within normal limits  CBG MONITORING, ED  TYPE AND SCREEN  PREPARE RBC (CROSSMATCH)    EKG EKG Interpretation  Date/Time:  Wednesday September 08 2019 13:57:49 EST Ventricular Rate:  97 PR Interval:  160 QRS Duration: 96 QT Interval:  352 QTC Calculation: 447 R Axis:   -63 Text Interpretation: Normal sinus rhythm with sinus arrhythmia Left anterior fascicular block Nonspecific T wave abnormality Abnormal ECG Confirmed by Nat Christen [phone]) on 09/08/2019 5:57:13 PM   Radiology No results found.  Procedures Procedures (including critical care time)  Medications Ordered in ED Medications  sodium chloride flush (NS) 0.9 % injection 3 mL (3 mLs Intravenous Not Given 09/08/19 1925)  0.9 %  sodium chloride infusion (Manually program via Guardrails IV Fluids) ( Intravenous New Bag/Given 09/08/19 2125)    ED Course  I have reviewed the triage vital signs and the nursing notes.  Pertinent labs & imaging results that were available during my care of the patient were reviewed by me and considered in my medical decision making (see chart for details).    MDM  Rules/Calculators/A&P                      Hemoglobin today 7.3.  This is most likely related to his chronic kidney disease.  Will transfuse 1 unit of packed cells.  Discharge from ED. Final  Clinical Impression(s) / ED Diagnoses Final diagnoses:  Anemia, unspecified type  Elevated serum creatinine    Rx / DC Orders ED Discharge Orders    None       Nat Christen, MD 09/08/19 2254

## 2019-09-08 NOTE — ED Notes (Addendum)
Pt urinated, but no sample collected at this time. Pt will try again later.

## 2019-09-08 NOTE — ED Triage Notes (Signed)
Pt presents to [facility] for assessment of 1 month or more of feeling weak, and states his heart rate has been elevated, which is a sign of anemia.  Patient states he is wanting to have his blood levels checked.

## 2019-09-08 NOTE — ED Triage Notes (Signed)
Pt went to PCP, was referred to u/c d/t MD no longer at practice.  Onset 1 month of feeling weak.  Hgb on 12-21 8.5 and COVID +.  Pt thinks hgb is still low.

## 2019-09-08 NOTE — ED Provider Notes (Signed)
[name]    CSN: [id number] Arrival date & time: 09/08/19  1143      History   Chief Complaint Chief Complaint  Patient presents with  . Tachycardia    HPI [name] [name] is a 74 y.o. male with history of numerous chronic comorbidities including anemia, diabetes, CKD, COPD, renal cell carcinoma, CAD presenting for recent tachycardia.  States for the last month he has been feeling weak and that his heart rate has been elevating.  States that it was over 100 bpm today which prompted evaluation.  Of note, patient was seen in urgent care setting on 08/23/2019 for similar concern.  EKG was largely unremarkable as compared to previous, and lab diagnostics were largely unremarkable for acute process other than patient was positive for COVID-19.  Was instructed to follow-up with primary care for further management of chronic conditions.  Patient denying active chest pain, shortness of breath in office today.  Has not yet followed up with PCP.  Patient also notes he has refused oncologic care.  Past Medical History:  Diagnosis Date  . Abnormal CT of liver    a. nodular contour suggesting cirrhosis 05/2018.  [name] Abnormal LFTs (liver function tests)   . Anxiety   . Aortic root dilatation ([place])   . Ascending aortic aneurysm ([place])   . BPH with urinary obstruction   . Bradycardia   . 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 CAD (coronary artery disease)    a. 12/2015 NSTEMI/Cath (in setting of PAF):  LM nl, LAD 30p,  LCX 66m, RCA  ok, AM 100%, RPDA1 40, RPDA 2 60 ->Med Rx.  . Childhood asthma   . Chronic diastolic CHF (congestive heart failure) ([place])    a. 12/2015 Echo: EF 50-55%, mod AI, mod Ao root dil, mild MR, mod dil LA, mod RA.  . CKD (chronic kidney disease), stage III   . Colon cancer ([place])   . COPD (chronic obstructive pulmonary disease) ([place])    pt denies at preop  . Diabetes mellitus type II     diet controlled  . History of syncope   . Hyperlipidemia   . Hypertensive heart disease   . Kidney lump 04/04/2010   Overview:  Renal Cell Carcinoma   . 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 Moderate aortic insufficiency    a. 12/2015 Echo: Mod AI.  [name] Paroxysmal atrial fibrillation ([place])    a. 12/2015 started on Xarelto (CHA2DS2VASc = 4-5).  . Pneumonia   . Prostate cancer ([place])   . Renal cell carcinoma ([place])   . Sleep apnea    Does not like  CPAP  . Spinal stenosis in cervical region 10/12/2016  . Venous insufficiency     Patient Active Problem List   Diagnosis Date Noted  . Educated about COVID-19 virus infection 08/04/2019  . Ascending aortic aneurysm ([place]) 08/04/2019  . 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 Acute renal failure superimposed on stage 3 chronic kidney disease ([place]) 06/04/2019  . CKD (chronic kidney disease), stage III 06/04/2019  . Renal cell carcinoma ([place]) 06/04/2019  . Gross hematuria 06/04/2019  . COPD exacerbation ([place]) 11/05/2018  . Palpitations 10/22/2018  . Counseling regarding advance care planning and goals of care 06/22/2018  . AKI (acute kidney injury) ([place])   . Anemia   . Sepsis ([place]) 06/05/2018  . Acute lower UTI 06/05/2018  . Colon polyp 05/21/2018  . Aortic  valve regurgitation 04/14/2018  . Medication management 04/14/2018  . Thoracic aortic aneurysm without rupture ([place]) 09/29/2017  . 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 Head trauma, subsequent encounter 10/12/2016  . 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 Spinal stenosis in cervical region 10/12/2016  . Cervical spondylosis 10/12/2016  . Syncope and collapse 10/12/2016  . Fall   . Syncope 10/11/2016  . CAD (coronary artery disease)   . Chronic diastolic CHF (congestive heart failure) ([place])   . Hyperlipidemia   . Hypertensive heart disease   . Paroxysmal atrial fibrillation ([place]) 12/17/2015  . Light chain  myeloma ([place]) 12/17/2015  . Mild intermittent asthma 10/18/2015  . Aortic root dilatation ([place]) 10/17/2015  . Diabetes ([place]) 10/17/2015  . 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 Hand paresthesia 12/09/2014  . Elevated prostate specific antigen (PSA) 07/12/2014  . Allergic rhinitis 07/12/2014  . LBP (low back pain) 07/12/2014  . Palpitation 07/11/2014  . Patient's other noncompliance with medication regimen 08/10/2013  . Chronic venous insufficiency 07/23/2012  . Obstructive apnea 04/11/2011  . Essential (primary) hypertension 09/07/2010  . 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 Anxiety, generalized 04/04/2010  . Cardiac conduction disorder 02/27/2010  . BPH (benign prostatic hyperplasia) 09/13/2009  . Abnormal findings on examination of genitourinary organs 08/09/2009  . Hereditary and idiopathic neuropathy 07/05/2009    Past Surgical History:  Procedure Laterality Date  . BACK SURGERY    . CARDIAC CATHETERIZATION N/A 12/18/2015   Procedure: Left Heart Cath and Coronary Angiography;  Surgeon: Leonie Man, MD;  Location: [facility];  Service: Cardiovascular;  Laterality: N/A;  . DIAGNOSTIC LAPAROSCOPY     partial colectomy  . IR FLUORO GUIDE CV LINE RIGHT  06/28/2019  . IR REMOVAL TUN CV CATH W/O FL  07/26/2019  . IR US GUIDE VASC ACCESS RIGHT  06/28/2019  . LAPAROSCOPIC PARTIAL COLECTOMY Right 05/21/2018   Procedure: LAPAROSCOPIC RIGHT  COLECTOMY ERAS PATHWAY;  Surgeon: Leighton Ruff, MD;  Location: [facility];  Service: General;  Laterality: Right;  . [place]   "replaced a disc"       Home Medications    Prior to Admission medications   Medication Sig Start Date End Date Taking? Authorizing Provider  acyclovir (ZOVIRAX) 200 MG capsule Take 1 capsule (200 mg total) by mouth 2 (two) times daily. 07/09/19   Brunetta Genera, MD  amLODipine (NORVASC) 10 MG tablet Take 1 tablet (10 mg total) by mouth  daily. 08/16/19   Fulp, Cammie, MD  cetirizine (ZYRTEC) 10 MG tablet Take 1 tablet (10 mg total) by mouth daily as needed (seasonal allergies). 08/16/19   Fulp, Cammie, MD  fluticasone (FLONASE) 50 MCG/ACT nasal spray Place 2 sprays into both nostrils daily as needed (seasonal allergies).  08/18/18   [provider]  glucose blood (ONETOUCH ULTRA) test strip Check FSBS twice a day. Dx: E11.21, N18.32 08/16/19   Fulp, Ander Gaster, MD  tamsulosin (FLOMAX) 0.4 MG CAPS capsule Take 0.4 mg by mouth daily.    [provider]    Family History Family History  Problem Relation Age of Onset  . Emphysema Father   . Asthma Father   . Liver disease Father        tumor  . Heart disease Mother   . Hypertension Mother   . Asthma Sister   . Hypertension Son  Social History Social History   Tobacco Use  . Smoking status: Former Smoker    Types: Cigars    Quit date: 09/02/1977    Years since quitting: 42.0  . Smokeless tobacco: Never Used  . Tobacco comment:    Substance Use Topics  . Alcohol use: Not Currently    Comment: "stopped drinking alcohol in ~ 1980; just drank a little on the weekends when I did drink"  . Drug use: No     Allergies   Amoxicillin and Penicillins   Review of Systems As per HPI   Physical Exam Triage Vital Signs ED Triage Vitals  Enc Vitals Group     BP      Pulse      Resp      Temp      Temp src      SpO2      Weight      Height      Head Circumference      Peak Flow      Pain Score      Pain Loc      Pain Edu?      Excl. in [place]?    No data found.  Updated Vital Signs BP 122/79 (BP Location: Right Arm)   Pulse (!) 104   Temp 97.9 F (36.6 C) (Temporal)   Resp 20   SpO2 96%   Visual Acuity Right Eye Distance:   Left Eye Distance:   Bilateral Distance:    Right Eye Near:   Left Eye Near:    Bilateral Near:     Physical Exam Constitutional:      General: He is not in acute distress.    Appearance: He is normal  weight. He is not toxic-appearing or diaphoretic.     Comments: Appears chronically ill  HENT:     Head: Normocephalic and atraumatic.     Mouth/Throat:     Mouth: Mucous membranes are moist.     Pharynx: Oropharynx is clear.  Eyes:     General: No scleral icterus.    Conjunctiva/sclera: Conjunctivae normal.     Pupils: Pupils are equal, round, and reactive to light.  Neck:     Comments: Trachea midline, negative JVD Cardiovascular:     Rate and Rhythm: Normal rate. Rhythm irregular.  Pulmonary:     Effort: Pulmonary effort is normal. No respiratory distress.     Breath sounds: No wheezing.  Musculoskeletal:     Right lower leg: No edema.     Left lower leg: No edema.  Skin:    Capillary Refill: Capillary refill takes 2 to 3 seconds.     Coloration: Skin is not jaundiced or pale.     Findings: No bruising or rash.  Neurological:     Mental Status: He is alert and oriented to person, place, and time.      UC Treatments / Results  Labs (all labs ordered are listed, but only abnormal results are displayed) Labs Reviewed - No data to display  EKG   Radiology No results found.  Procedures Procedures (including critical care time)  Medications Ordered in UC Medications - No data to display  Initial Impression / Assessment and Plan / UC Course  I have reviewed the triage vital signs and the nursing notes.  Pertinent labs & imaging results that were available during my care of the patient were reviewed by me and considered in my medical decision making (see chart for details).  Patient afebrile, nontoxic in office today.  Hemodynamically stable with mild tachycardia.  Offered EKG: Patient declined.  Offered to schedule appoint with with PCP sooner than currently scheduled (09/20/2019): Patient declined.  This provider also offered lab diagnostics including repeat CBC as patient had requested at time of triage: Declined.  Patient states "I think I would go over to the  ER later tonight anyway".  When asked why, patient states "because of how I feel ".  Patient felt to be of sound mind at time of discharge, transported to ER via neighbor. Final Clinical Impressions(s) / UC Diagnoses   Final diagnoses:  Chronic fatigue   Discharge Instructions   None    ED Prescriptions    None     PDMP not reviewed this encounter.   Neldon Mc [place], [place] 09/09/19 1911

## 2019-09-08 NOTE — Discharge Instructions (Signed)
You are anemic which is probably a result of your kidney disease.  You received 1 unit of blood tonight.  Follow-up with your primary care doctor.

## 2019-09-09 LAB — TYPE AND SCREEN
ABO/RH(D): A POS
Antibody Screen: NEGATIVE
Unit division: 0

## 2019-09-09 LAB — BPAM RBC
Blood Product Expiration Date: 202101282359
ISSUE DATE / TIME: 202101062056
Unit Type and Rh: 6200

## 2019-09-20 ENCOUNTER — Other Ambulatory Visit: Payer: Self-pay

## 2019-09-20 ENCOUNTER — Ambulatory Visit (INDEPENDENT_AMBULATORY_CARE_PROVIDER_SITE_OTHER): Payer: Medicare Other | Admitting: Internal Medicine

## 2019-09-20 DIAGNOSIS — U071 COVID-19: Secondary | ICD-10-CM

## 2019-09-20 NOTE — Progress Notes (Signed)
Virtual Visit via Telephone Note  I connected with Butler, on 09/20/2019 at 1:31 PM by telephone due to the COVID-19 pandemic and verified that I am speaking with the correct person using two identifiers.   Consent: I discussed the limitations, risks, security and privacy concerns of performing an evaluation and management service by telephone and the availability of in person appointments. I also discussed with the patient that there may be a patient responsible charge related to this service. The patient expressed understanding and agreed to proceed.   Location of Patient: Home   Location of Provider: Clinic    Persons participating in Telemedicine visit: Jermarion A Hubka Heide Guile Dr. Juleen China      History of Present Illness: Patient requests that he have an appointment to get everything checked out and would like his lab work completed. He was diagnosed with COVID with about 4 weeks ago (12/21). Reports he is starting to feel better. His heart rate was initially quite high with the infection. He has been monitoring it at home and its now back in 33s. He would prefer to come in to see me to discuss his chronic medical problems and get his vitals taken.    Past Medical History:  Diagnosis Date  . Abnormal CT of liver    a. nodular contour suggesting cirrhosis 05/2018.  [name] Abnormal LFTs (liver function tests)   . Anxiety   . Aortic root dilatation ([place])   . Ascending aortic aneurysm ([place])   . BPH with urinary obstruction   . Bradycardia   . 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 CAD (coronary artery disease)    a. 12/2015 NSTEMI/Cath (in setting of PAF):  LM nl, LAD 30p,  LCX 30m, RCA  ok, AM 100%, RPDA1 40, RPDA 2 60 ->Med Rx.  . Childhood asthma   . Chronic diastolic CHF (congestive heart failure) ([place])    a. 12/2015 Echo: EF 50-55%, mod AI, mod Ao root dil, mild MR, mod dil LA, mod RA.   . CKD (chronic kidney disease), stage III   . Colon cancer ([place])   . COPD (chronic obstructive pulmonary disease) ([place])    pt denies at preop  . Diabetes mellitus type II    diet controlled  . History of syncope   . Hyperlipidemia   . Hypertensive heart disease   . Kidney lump 04/04/2010   Overview:  Renal Cell Carcinoma   . 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 Moderate aortic insufficiency    a. 12/2015 Echo: Mod AI.  [name] Paroxysmal atrial fibrillation ([place])    a. 12/2015 started on Xarelto (CHA2DS2VASc = 4-5).  . Pneumonia   . Prostate cancer ([place])   . Renal cell carcinoma ([place])   . Sleep apnea    Does not like  CPAP  . Spinal stenosis in cervical region 10/12/2016  . Venous insufficiency    Allergies  Allergen Reactions  . Amoxicillin Other (See Comments)    Tolerates Unasyn. Can't move Has patient had a PCN reaction causing immediate rash, facial/tongue/throat swelling, SOB or lightheadedness with hypotension: No Has patient had a PCN reaction causing severe rash involving mucus membranes or skin necrosis: No Has patient had a PCN reaction that required hospitalization No Has patient had a PCN reaction occurring within the last 10 years: No If all of the above answers are "NO", then may proceed with Cephalosporin use.   [name]  Penicillins Other (See Comments)    Tolerates Unasyn. Can't move/dizziness Has patient had a PCN reaction causing immediate rash, facial/tongue/throat swelling, SOB or lightheadedness with hypotension: No Has patient had a PCN reaction causing severe rash involving mucus membranes or skin necrosis: No Has patient had a PCN reaction that required hospitalization No Has patient had a PCN reaction occurring within the last 10 years: No If all of the above answers are "NO", then may proceed with Cephalosporin use.      Current Outpatient Medications on File Prior to Visit  Medication Sig Dispense Refill  . acyclovir (ZOVIRAX) 200 MG capsule Take 1 capsule (200 mg  total) by mouth 2 (two) times daily. 60 capsule 0  . amLODipine (NORVASC) 10 MG tablet Take 1 tablet (10 mg total) by mouth daily. 90 tablet 0  . cetirizine (ZYRTEC) 10 MG tablet Take 1 tablet (10 mg total) by mouth daily as needed (seasonal allergies). 90 tablet 0  . fluticasone (FLONASE) 50 MCG/ACT nasal spray Place 2 sprays into both nostrils daily as needed (seasonal allergies).     [name] glucose blood (ONETOUCH ULTRA) test strip Check FSBS twice a day. Dx: E11.21, N18.32 100 each 3  . tamsulosin (FLOMAX) 0.4 MG CAPS capsule Take 0.4 mg by mouth daily.     No current facility-administered medications on file prior to visit.    Observations/Objective: NAD. Speaking clearly.  Work of breathing normal. No cough noted.  Alert and oriented. Mood appropriate.   Assessment and Plan: 1. COVID-19 virus infection Patient seems to be recovering well. No signs of distress on phone. Continued supportive care measures as needed.   Follow Up Instructions: Will have patient come in for follow up in the next few weeks for chronic medical problems and labs.    I discussed the assessment and treatment plan with the patient. The patient was provided an opportunity to ask questions and all were answered. The patient agreed with the plan and demonstrated an understanding of the instructions.   The patient was advised to call back or seek an in-person evaluation if the symptoms worsen or if the condition fails to improve as anticipated.     I provided 11 minutes total of non-face-to-face time during this encounter including median intraservice time, reviewing previous notes, investigations, ordering medications, medical decision making, coordinating care and patient verbalized understanding at the end of the visit.    Phill Myron, D.O. Primary Care at [facility]  09/20/2019, 1:31 PM

## 2019-09-24 ENCOUNTER — Telehealth: Payer: Self-pay | Admitting: Cardiology

## 2019-09-24 ENCOUNTER — Other Ambulatory Visit: Payer: Self-pay

## 2019-09-24 ENCOUNTER — Encounter: Payer: Self-pay | Admitting: Cardiology

## 2019-09-24 ENCOUNTER — Ambulatory Visit (INDEPENDENT_AMBULATORY_CARE_PROVIDER_SITE_OTHER): Payer: Medicare Other | Admitting: Cardiology

## 2019-09-24 VITALS — BP 124/90 | HR 68 | Temp 97.2°F | Ht 68.5 in | Wt 213.0 lb

## 2019-09-24 DIAGNOSIS — R002 Palpitations: Secondary | ICD-10-CM | POA: Diagnosis not present

## 2019-09-24 NOTE — Patient Instructions (Signed)
Medication Instructions:  No changes *If you need a refill on your cardiac medications before your next appointment, please call your pharmacy*  Lab Work: None  Testing/Procedures: Your physician has recommended that you wear an event monitor. Event monitors are medical devices that record the heart's electrical activity. Doctors most often [place] these monitors to diagnose arrhythmias. Arrhythmias are problems with the speed or rhythm of the heartbeat. The monitor is a small, portable device. You can wear one while you do your normal daily activities. This is usually used to diagnose what is causing palpitations/syncope (passing out).  Follow-Up: At [facility], you and your health needs are our priority.  As part of our continuing mission to provide you with exceptional heart care, we have created designated Provider Care Teams.  These Care Teams include your primary Cardiologist (physician) and Advanced Practice Providers (APPs -  Physician Assistants and Nurse Practitioners) who all work together to provide you with the care you need, when you need it.  Your next appointment:   1 month(s)  The format for your next appointment:   In Person  Provider:   Kerin Ransom  Other Instructions [employer] Cardiac Event Monitor Instructions Your physician has requested you wear your cardiac event monitor for 14 days. [employer] may call or text to confirm a shipping address. The monitor will be sent to a land address via UPS. [employer] will not ship a monitor to a PO BOX. It typically takes 3-5 days to receive your monitor after it has been enrolled. [employer] will assist with USPS tracking if your package is delayed. The telephone number for [employer] is [phone]. Once you have received your monitor, please review the enclosed instructions. Instruction tutorials can also be viewed under help and settings on the enclosed cell phone. Your monitor has already been registered assigning a  specific monitor serial # to you.  Applying the monitor Remove cell phone from case and turn it on. The cell phone works as Dealer and needs to be within [employer] of you at all times. The cell phone will need to be charged on a daily basis. We recommend you plug the cell phone into the enclosed charger at your bedside table every night.  Monitor batteries: You will receive two monitor batteries labelled #1 and #2. These are your recorders. Plug battery #2 onto the second connection on the enclosed charger. Keep one battery on the charger at all times. This will keep the monitor battery deactivated. It will also keep it fully charged for when you need to switch your monitor batteries. A small light will be blinking on the battery emblem when it is charging. The light on the battery emblem will remain on when the battery is fully charged.  Open package of a Monitor strip. Insert battery #1 into black hood on strip and gently squeeze monitor battery onto connection as indicated in instruction booklet. Set aside while preparing skin.  Choose location for your strip, vertical or horizontal, as indicated in the instruction booklet. Shave to remove all hair from location. There cannot be any lotions, oils, powders, or colognes on skin where monitor is to be applied. Wipe skin clean with enclosed Saline wipe. Dry skin completely.  Peel paper labeled #1 off the back of the Monitor strip exposing the adhesive. Place the monitor on the chest in the vertical or horizontal position shown in the instruction booklet. One arrow on the monitor strip must be pointing upward. Carefully remove paper labeled #2, attaching remainder  of strip to your skin. Try not to create any folds or wrinkles in the strip as you apply it.  Firmly press and release the circle in the center of the monitor battery. You will hear a small beep. This is turning the monitor battery on. The heart emblem on the monitor battery  will light up every 5 seconds if the monitor battery in turned on and connected to the patient securely. Do not push and hold the circle down as this turns the monitor battery off. The cell phone will locate the monitor battery. A screen will appear on the cell phone checking the connection of your monitor strip. This may read poor connection initially but change to good connection within the next minute. Once your monitor accepts the connection you will hear a series of 3 beeps followed by a climbing crescendo of beeps. A screen will appear on the cell phone showing the two monitor strip placement options. Touch the picture that demonstrates where you applied the monitor strip.  Your monitor strip and battery are waterproof. You are able to shower, bathe, or swim with the monitor on. They just ask you do not submerge deeper than 3 feet underwater. We recommend removing the monitor if you are swimming in a lake, river, or ocean.  Your monitor battery will need to be switched to a fully charged monitor battery approximately once a week. The cell phone will alert you of an action which needs to be made.  On the cell phone, tap for details to reveal connection status, monitor battery status, and cell phone battery status. The green dots indicates your monitor is in good status. A red dot indicates there is something that needs your attention.  To record a symptom, click the circle on the monitor battery. In 30-60 seconds a list of symptoms will appear on the cell phone. Select your symptom and tap save. Your monitor will record a sustained or significant arrhythmia regardless of you clicking the button. Some patients do not feel the heart rhythm irregularities. [employer] will notify us of any serious or critical events.  Refer to instruction booklet for instructions on switching batteries, changing strips, the Do not disturb or Pause features, or any additional questions.  Call [employer] at  [phone], to confirm your monitor is transmitting and record your baseline. They will answer any questions you may have regarding the monitor instructions at that time.  Returning the monitor to [place] all equipment back into blue box. Peel off strip of paper to expose adhesive and close box securely. There is a prepaid UPS shipping label on this box. Drop in a UPS drop box, or at a UPS facility like [employer]. You may also contact [employer] to arrange UPS to pick up monitor package at your home.

## 2019-09-24 NOTE — Telephone Encounter (Signed)
STAT if HR is under 50 or over 120 (normal HR is 60-100 beats per minute)  1) What is your heart rate? In the 90's which he said is high for him  2) Do you have a log of your heart rate readings (document readings)? no  3) Do you have any other symptoms? Chest pounding so hard, sounds like a bongo- I made an appt for today 2:40 with Hochrein- please call to evaluate to see if pt might need to be seen sooner

## 2019-09-24 NOTE — Telephone Encounter (Signed)
I spoke with patient. He reports heart rate is usually in 60-70 range. Heart rate is currently in the 80's and is going up and down.  No chest pain. No shortness of breath.  He has been trying to walk but reports decreased energy and feeling weak.  He received a blood transfusion recently and is concerned about lab work. He will keep appointment with Dr Percival Spanish today as scheduled.

## 2019-09-24 NOTE — Progress Notes (Signed)
Cardiology Office Note   Date:  09/24/2019   ID:  [name], [name] 1945/10/06, MRN [id number]  PCP:  Scot Jun, FNP  Cardiologist:   Minus Breeding, MD   Chief Complaint  Patient presents with  . Tachycardia      History of Present Illness: [name] [name] is a 74 y.o. male who presents for evaluation of palpitations and CAD.  He was in the hospital in October.  He had acute on chronic renal insufficiency.  He was being managed for hematuria and was having trouble with urination.  He has had multiple myeloma and renal cell cancer.  I reviewed the oncology note from last month and the patient has decided to stop all future oncology treatment.  He refused hospice.  He had his dialysis catheter removed.  He did however follow up with an echo and had moderate AI and mild MR.  He had aortic root enlargement.    He called today and is added to my schedule.  He has decreased energy and increased weakness.  He had called the other day because his heart has been beating fast and he wanted to be seen sooner.  He was in the emergency room earlier this month.  He was anemic and required transfusion. I reviewed these records for this visit.    He has felt like his heart has been skipping and at times he feels tired.  He feels thirsty.  He feels like when he is doing something was pushing something.  He feels fatigued.  He tried to walk his 15 minutes but he says that he has been making him weak.  He does have follow-up lab work scheduled after his transfusion.  He has these episodes where he feels like he is fading out after he eats.  He does not think it is falling asleep.  Has not had any syncope or trauma.  He denies any chest pressure, neck or arm discomfort.  He is not having any new PND or orthopnea.   Past Medical History:  Diagnosis Date  . Abnormal CT of liver    a. nodular contour suggesting cirrhosis 05/2018.  [name] Abnormal LFTs (liver function tests)   . Anxiety   . Aortic root  dilatation ([place])   . Ascending aortic aneurysm ([place])   . BPH with urinary obstruction   . Bradycardia   . 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 CAD (coronary artery disease)    a. 12/2015 NSTEMI/Cath (in setting of PAF):  LM nl, LAD 30p,  LCX 99m RCA  ok, AM 100%, RPDA1 40, RPDA 2 60 ->Med Rx.  . Childhood asthma   . Chronic diastolic CHF (congestive heart failure) ([place]    a. 12/2015 Echo: EF 50-55%, mod AI, mod Ao root dil, mild MR, mod dil LA, mod RA.  . CKD (chronic kidney disease), stage III   . Colon cancer ([place]   . COPD (chronic obstructive pulmonary disease) ([place]    pt denies at preop  . Diabetes mellitus type II    diet controlled  . History of syncope   . Hyperlipidemia   . Hypertensive heart disease   . Kidney lump 04/04/2010   Overview:  Renal Cell Carcinoma   . Light chain myeloma ([place]   . Moderate aortic insufficiency    a. 12/2015 Echo: Mod AI.  .Marland KitchenParoxysmal atrial fibrillation ([place]    a.  12/2015 started on Xarelto (CHA2DS2VASc = 4-5).  . Pneumonia   . Prostate cancer ([place])   . Renal cell carcinoma ([place])   . Sleep apnea    Does not like  CPAP  . Spinal stenosis in cervical region 10/12/2016  . Venous insufficiency     Past Surgical History:  Procedure Laterality Date  . BACK SURGERY    . CARDIAC CATHETERIZATION N/A 12/18/2015   Procedure: Left Heart Cath and Coronary Angiography;  Surgeon: Leonie Man, MD;  Location: [facility];  Service: Cardiovascular;  Laterality: N/A;  . DIAGNOSTIC LAPAROSCOPY     partial colectomy  . IR FLUORO GUIDE CV LINE RIGHT  06/28/2019  . IR REMOVAL TUN CV CATH W/O FL  07/26/2019  . IR US GUIDE VASC ACCESS RIGHT  06/28/2019  . LAPAROSCOPIC PARTIAL COLECTOMY Right 05/21/2018   Procedure: LAPAROSCOPIC RIGHT  COLECTOMY ERAS PATHWAY;  Surgeon: Leighton Ruff, MD;  Location: [facility];  Service: General;  Laterality: Right;  . [place]   "replaced a disc"     Current Outpatient Medications  Medication Sig Dispense Refill  . acyclovir (ZOVIRAX) 200 MG capsule Take 1 capsule (200 mg total) by mouth 2 (two) times daily. 60 capsule 0  . amLODipine (NORVASC) 10 MG tablet Take 1 tablet (10 mg total) by mouth daily. 90 tablet 0  . cetirizine (ZYRTEC) 10 MG tablet Take 1 tablet (10 mg total) by mouth daily as needed (seasonal allergies). 90 tablet 0  . fluticasone (FLONASE) 50 MCG/ACT nasal spray Place 2 sprays into both nostrils daily as needed (seasonal allergies).     [name] glucose blood (ONETOUCH ULTRA) test strip Check FSBS twice a day. Dx: E11.21, N18.32 100 each 3  . tamsulosin (FLOMAX) 0.4 MG CAPS capsule Take 0.4 mg by mouth daily.     No current facility-administered medications for this visit.    Allergies:   Amoxicillin and Penicillins    ROS:  Please see the history of present illness.   Otherwise, review of systems are positive for none.   All other systems are reviewed and negative.    PHYSICAL EXAM: VS:  BP 124/90 (BP Location: Left Arm, Patient Position: Sitting, Cuff Size: Normal)   Pulse 68   Temp (!) 97.2 F (36.2 C)   Ht 5' 8.5" (1.74 m)   Wt 213 lb (96.6 kg)   BMI 31.92 kg/m  , BMI Body mass index is 31.92 kg/m. GENERAL:  Well appearing NECK:  No jugular venous distention, waveform within normal limits, carotid upstroke brisk and symmetric, no bruits, no thyromegaly LUNGS:  Clear to auscultation bilaterally CHEST:  Unremarkable HEART:  PMI not displaced or sustained,S1 and S2 within normal limits, no S3, no S4, no clicks, no rubs, 2 out of 6 apical systolic murmur radiating slightly at the outflow tract, 2/6 diastolic murmur murmurs ABD:  Flat, positive bowel sounds normal in frequency in pitch, no bruits, no rebound, no guarding, no midline pulsatile mass, no hepatomegaly, no splenomegaly EXT:  2 plus pulses throughout, no edema, no cyanosis no clubbing   EKG:  EKG is ordered  today. The ekg ordered today demonstrates sinus rhythm, rate 68, left axis deviation, poor anterior R wave progression, no acute ST-T wave changes.   Recent Labs: 07/22/2019: ALT 28 08/23/2019: TSH 1.852 09/08/2019: BUN 54; Creatinine, Ser 3.82; Hemoglobin 7.3; Platelets 242; Potassium 3.8; Sodium 138    Lipid Panel    Component Value Date/Time   CHOL 199  09/30/2017 1042   TRIG 109 09/30/2017 1042   HDL 56 09/30/2017 1042   CHOLHDL 3.6 09/30/2017 1042   CHOLHDL 3.3 10/11/2016 1900   VLDL 27 10/11/2016 1900   LDLCALC 121 (H) 09/30/2017 1042      Wt Readings from Last 3 Encounters:  09/24/19 213 lb (96.6 kg)  08/05/19 210 lb 12.8 oz (95.6 kg)  07/22/19 213 lb 8 oz (96.8 kg)      Other studies Reviewed: Additional studies/ records that were reviewed today include: None. Review of the above records demonstrates:  Please see elsewhere in the note.     ASSESSMENT AND PLAN:   Palpitations He has some vague symptoms.  He has had normal thyroid testing and electrolytes.  I am going to apply a 2-week event monitor.  Further management will be based on this.   Anemia: He is waiting to hear from his new primary provider who is planning to do a full panel of labs including CBC.  Atrial fibrillation He would be high risk for anticoagulation.  The artery screening as above.   Essential (primary) hypertension He has had some mildly elevated diastolic pressures with systolics have been normal.  No change in therapy.  CAD (coronary artery disease) He is not having any angina.  No change in therapy.  Chronic diastolic CHF (congestive heart failure) ([place]) He seems to be euvolemic.  No change in therapy.  AI He has moderate aortic insufficiency and I will manage this expectantly.  AORTIC ENLARGEMENT:  He would not be a candidate for repair given his untreated comorbid diseases.    No further imaging is planned.    Current medicines are reviewed at length with the patient  today.  The patient does not have concerns regarding medicines.  The following changes have been made:  No change.  Labs/ tests ordered today include:     Orders Placed This Encounter  Procedures  . Cardiac event monitor  . EKG 12-Lead     Disposition:   FU with me or the APP.   Signed, Minus Breeding, MD  09/24/2019 3:45 PM    [facility]

## 2019-09-27 ENCOUNTER — Encounter: Payer: Self-pay | Admitting: *Deleted

## 2019-09-27 NOTE — Progress Notes (Signed)
Patient ID: [name] [name], male   DOB: 1945-09-28, 74 y.o.   MRN: [id number] Patient enrolled for [employer] to ship a 14 day cardiac event monitor to his home.

## 2019-10-01 ENCOUNTER — Telehealth: Payer: Self-pay | Admitting: Family Medicine

## 2019-10-01 NOTE — Telephone Encounter (Signed)
Patient called about lab work saw last visit was on the 09/20/19 and it was for Covid. When would you like him in

## 2019-10-01 NOTE — Telephone Encounter (Signed)
He can come in anytime that works for his schedule. His COVID test was from 5 weeks ago and he has recovered. Thanks!  Phill Myron, D.O. Primary Care at [facility]  10/01/2019, 4:48 PM

## 2019-10-04 ENCOUNTER — Ambulatory Visit (INDEPENDENT_AMBULATORY_CARE_PROVIDER_SITE_OTHER): Payer: Medicare Other

## 2019-10-04 DIAGNOSIS — R002 Palpitations: Secondary | ICD-10-CM

## 2019-10-04 NOTE — Telephone Encounter (Signed)
Needs nonfasting lab appointment

## 2019-10-04 NOTE — Telephone Encounter (Signed)
Called the patient there was no answer I'm think because it was from [facility] please try to reach out.

## 2019-10-13 ENCOUNTER — Telehealth: Payer: Self-pay

## 2019-10-13 NOTE — Telephone Encounter (Signed)
Called patient to do their pre-visit COVID screening.  Have you tested positive for COVID or are you currently waiting for COVID test results? Not since 08/2019  Have you recently traveled internationally([place], [place], [place], [place], [place]) or within the [place] to a hotspot area([place], [place], [place], [place], [place])? no  Are you currently experiencing any of the following symptoms: fever, cough, SHOB, fatigue, body aches, loss of smell/taste, rash, diarrhea, vomiting, severe headaches, weakness, sore throat? no  Have you been in contact with anyone who has recently travelled? no  Have you been in contact with anyone who is experiencing any of the above symptoms or been diagnosed with COVID  or works in or has recently visited a SNF? no

## 2019-10-14 ENCOUNTER — Encounter: Payer: Self-pay | Admitting: Internal Medicine

## 2019-10-14 ENCOUNTER — Ambulatory Visit (INDEPENDENT_AMBULATORY_CARE_PROVIDER_SITE_OTHER): Payer: Medicare Other | Admitting: Internal Medicine

## 2019-10-14 ENCOUNTER — Other Ambulatory Visit: Payer: Self-pay

## 2019-10-14 VITALS — BP 129/77 | HR 71 | Temp 97.3°F | Resp 18 | Ht 67.0 in | Wt 209.0 lb

## 2019-10-14 DIAGNOSIS — K439 Ventral hernia without obstruction or gangrene: Secondary | ICD-10-CM | POA: Diagnosis not present

## 2019-10-14 DIAGNOSIS — D649 Anemia, unspecified: Secondary | ICD-10-CM | POA: Diagnosis not present

## 2019-10-14 DIAGNOSIS — Z13228 Encounter for screening for other metabolic disorders: Secondary | ICD-10-CM | POA: Diagnosis not present

## 2019-10-14 DIAGNOSIS — Z0000 Encounter for general adult medical examination without abnormal findings: Secondary | ICD-10-CM

## 2019-10-14 NOTE — Progress Notes (Signed)
Subjective:    [name] [name] - 74 y.o. male MRN [id number]  Date of birth: July 23, 1946  HPI  [name] [name] is here for annual exam. Concerns about his hernia today.      Health Maintenance:  Health Maintenance Due  Topic Date Due  . COLONOSCOPY  04/04/1994  . INFLUENZA VACCINE  04/03/2019  . TETANUS/TDAP  04/21/2019  . URINE MICROALBUMIN  10/07/2019    -  reports that he quit smoking about 42 years ago. His smoking use included cigars. He has never used smokeless tobacco. - Review of Systems: Per HPI. - Past Medical History: Patient Active Problem List   Diagnosis Date Noted  . Ascending aortic aneurysm ([place]) 08/04/2019  . 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 CKD (chronic kidney disease), stage III 06/04/2019  . Renal cell carcinoma ([place]) 06/04/2019  . Gross hematuria 06/04/2019  . COPD exacerbation ([place]) 11/05/2018  . Palpitations 10/22/2018  . Counseling regarding advance care planning and goals of care 06/22/2018  . AKI (acute kidney injury) ([place])   . Anemia   . Sepsis ([place]) 06/05/2018  . Acute lower UTI 06/05/2018  . Colon polyp 05/21/2018  . Aortic valve regurgitation 04/14/2018  . Medication management 04/14/2018  . Thoracic aortic aneurysm without rupture ([place]) 09/29/2017  . 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 Head trauma, subsequent encounter 10/12/2016  . 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 Spinal stenosis in cervical region 10/12/2016  . Cervical spondylosis 10/12/2016  . CAD (coronary artery disease)   . Chronic diastolic CHF (congestive heart failure) ([place])   . Hyperlipidemia   . Hypertensive heart disease   . Paroxysmal atrial fibrillation ([place]) 12/17/2015  . 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 Mild intermittent asthma 10/18/2015  . Aortic root dilatation ([place]) 10/17/2015  . Diabetes ([place]) 10/17/2015  . MGUS (monoclonal gammopathy of unknown significance)  01/17/2015  . Hand paresthesia 12/09/2014  . Elevated prostate specific antigen (PSA) 07/12/2014  . Allergic rhinitis 07/12/2014  . LBP (low back pain) 07/12/2014  . Palpitation 07/11/2014  . Patient's other noncompliance with medication regimen 08/10/2013  . Chronic venous insufficiency 07/23/2012  . Obstructive apnea 04/11/2011  . Essential (primary) hypertension 09/07/2010  . 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 Anxiety, generalized 04/04/2010  . Cardiac conduction disorder 02/27/2010  . BPH (benign prostatic hyperplasia) 09/13/2009  . Abnormal findings on examination of genitourinary organs 08/09/2009  . Hereditary and idiopathic neuropathy 07/05/2009   - Medications: reviewed and updated   Objective:   Physical Exam BP 129/77   Pulse 71   Temp (!) 97.3 F (36.3 C) (Temporal)   Resp 18   Ht 5\' 7"  (1.702 m)   Wt 209 lb (94.8 kg)   SpO2 96%   BMI 32.73 kg/m  Physical Exam  Constitutional: He is oriented to person, place, and time and well-developed, well-nourished, and in no distress.  HENT:  Head: Normocephalic and atraumatic.  Mouth/Throat: Oropharynx is clear and moist.  Eyes: Pupils are equal, round, and reactive to light. Conjunctivae and EOM are normal.  Neck: No thyromegaly present.  Cardiovascular: Normal rate, regular rhythm, normal heart sounds and intact distal pulses.  No murmur heard. Pulmonary/Chest: Effort normal and breath sounds normal. No respiratory distress. He has no wheezes.  Abdominal: Soft. Bowel sounds are normal. He exhibits no distension. There is no abdominal tenderness. There is no rebound and no guarding.  Large reducible abdominal wall hernia present on exam.   Musculoskeletal:        General: No deformity or edema. Normal range of motion.     Cervical back: Normal range of motion and neck supple.  Lymphadenopathy:    He has no cervical adenopathy.  Neurological: He is alert and oriented to person, place, and time.  Gait normal.  Skin: Skin is warm and dry. No rash noted. He is not diaphoretic.  Psychiatric: Mood, affect and judgment normal.           Assessment & Plan:   1. Encounter for annual physical exam Counseled on 150 minutes of exercise per week, healthy eating (including decreased daily intake of saturated fats, cholesterol, added sugars, sodium), STI prevention, routine healthcare maintenance. Healthcare power of attorney and living will forms given to patient.    2. Screening for metabolic disorder - Lipid panel - Comprehensive metabolic panel  3. Hernia of abdominal wall Patient requests referral to see if he would be a candidate for surgery as the hernia is very bothersome to him. Present on CT imaging of abdomen in 2019.  - Ambulatory referral to General Surgery  4. Anemia, unspecified type Noted to be anemic in ED on 1/6 with HgB 7.3. Transfusion with 1u PRBC performed. May be related to his CKD. Repeat CBC today.  - CBC   Phill Myron, D.O. 10/14/2019, 2:02 PM Primary Care at [facility]

## 2019-10-15 ENCOUNTER — Other Ambulatory Visit: Payer: Self-pay

## 2019-10-15 ENCOUNTER — Telehealth: Payer: Self-pay

## 2019-10-15 ENCOUNTER — Other Ambulatory Visit: Payer: Self-pay | Admitting: Internal Medicine

## 2019-10-15 DIAGNOSIS — I10 Essential (primary) hypertension: Secondary | ICD-10-CM

## 2019-10-15 DIAGNOSIS — R718 Other abnormality of red blood cells: Secondary | ICD-10-CM

## 2019-10-15 DIAGNOSIS — D649 Anemia, unspecified: Secondary | ICD-10-CM

## 2019-10-15 DIAGNOSIS — N183 Chronic kidney disease, stage 3 unspecified: Secondary | ICD-10-CM

## 2019-10-15 LAB — COMPREHENSIVE METABOLIC PANEL
ALT: 17 IU/L (ref 0–44)
AST: 20 IU/L (ref 0–40)
Albumin/Globulin Ratio: 1.6 (ref 1.2–2.2)
Albumin: 4.1 g/dL (ref 3.7–4.7)
Alkaline Phosphatase: 110 IU/L (ref 39–117)
BUN/Creatinine Ratio: 13 (ref 10–24)
BUN: 58 mg/dL — ABNORMAL HIGH (ref 8–27)
Bilirubin Total: 0.3 mg/dL (ref 0.0–1.2)
CO2: 21 mmol/L (ref 20–29)
Calcium: 9.2 mg/dL (ref 8.6–10.2)
Chloride: 105 mmol/L (ref 96–106)
Creatinine, Ser: 4.41 mg/dL — ABNORMAL HIGH (ref 0.76–1.27)
GFR calc Af Amer: 14 mL/min/{1.73_m2} — ABNORMAL LOW (ref >59)
GFR calc non Af Amer: 12 mL/min/{1.73_m2} — ABNORMAL LOW (ref >59)
Globulin, Total: 2.5 g/dL (ref 1.5–4.5)
Glucose: 81 mg/dL (ref 65–99)
Potassium: 3.6 mmol/L (ref 3.5–5.2)
Sodium: 143 mmol/L (ref 134–144)
Total Protein: 6.6 g/dL (ref 6.0–8.5)

## 2019-10-15 LAB — CBC
Hematocrit: 24 % — ABNORMAL LOW (ref 37.5–51.0)
Hemoglobin: 7.9 g/dL — ABNORMAL LOW (ref 13.0–17.7)
MCH: 32.1 pg (ref 26.6–33.0)
MCHC: 32.9 g/dL (ref 31.5–35.7)
MCV: 98 fL — ABNORMAL HIGH (ref 79–97)
Platelets: 208 10*3/uL (ref 150–450)
RBC: 2.46 x10E6/uL — CL (ref 4.14–5.80)
RDW: 14.5 % (ref 11.6–15.4)
WBC: 4.3 10*3/uL (ref 3.4–10.8)

## 2019-10-15 LAB — LIPID PANEL
Chol/HDL Ratio: 3.6 ratio (ref 0.0–5.0)
Cholesterol, Total: 189 mg/dL (ref 100–199)
HDL: 53 mg/dL (ref >39)
LDL Chol Calc (NIH): 115 mg/dL — ABNORMAL HIGH (ref 0–99)
Triglycerides: 119 mg/dL (ref 0–149)
VLDL Cholesterol Cal: 21 mg/dL (ref 5–40)

## 2019-10-15 MED ORDER — ATORVASTATIN CALCIUM 40 MG PO TABS: 40.0000 mg | ORAL_TABLET | Freq: Every day | ORAL | 3 refills | Status: DC

## 2019-10-15 MED ORDER — TAMSULOSIN HCL 0.4 MG PO CAPS: 0.4000 mg | ORAL_CAPSULE | Freq: Every day | ORAL | 0 refills | Status: AC

## 2019-10-15 MED ORDER — AMLODIPINE BESYLATE 10 MG PO TABS: 10.0000 mg | ORAL_TABLET | Freq: Every day | ORAL | 0 refills | Status: DC

## 2019-10-15 NOTE — Telephone Encounter (Signed)
Called Patient [name] [name] to get patient scheduled for outpatient blood transfusion.  Was able to get patient scheduled for 10/19/2019 @ 8:30 AM. Will call patient to notify him of appointment details.

## 2019-10-15 NOTE — Progress Notes (Signed)
Patient notified of results & recommendations. Expressed understanding.  Patient notified of appointment for outpatient transfusion on 10/19/2019 @ 8:30 AM.

## 2019-10-19 ENCOUNTER — Ambulatory Visit (HOSPITAL_COMMUNITY)
Admission: RE | Admit: 2019-10-19 | Discharge: 2019-10-19 | Disposition: A | Discharge status: Home / Self Care | Payer: Medicare Other | Source: Ambulatory Visit | Attending: Internal Medicine | Admitting: Internal Medicine

## 2019-10-19 ENCOUNTER — Other Ambulatory Visit: Payer: Self-pay

## 2019-10-19 DIAGNOSIS — D649 Anemia, unspecified: Secondary | ICD-10-CM | POA: Insufficient documentation

## 2019-10-19 LAB — PREPARE RBC (CROSSMATCH)

## 2019-10-19 MED ORDER — SODIUM CHLORIDE 0.9% IV SOLUTION: Freq: Once | INTRAVENOUS | Status: AC

## 2019-10-19 NOTE — Discharge Instructions (Signed)

## 2019-10-19 NOTE — Progress Notes (Signed)
PATIENT CARE CENTER NOTE  Diagnosis: Anemia, unspecified type    Provider: Phill Myron, DO   Procedure: 1 unit PRBC   Note: Type and Screen drawn and patient received 1 unit of blood via PIV. Tolerated transfusion well with no adverse reaction. Vital signs remained stable. Discharge instructions given. Patient alert, oriented and ambulatory at discharge.

## 2019-10-20 LAB — BPAM RBC
Blood Product Expiration Date: 202103202359
ISSUE DATE / TIME: 202102161056
Unit Type and Rh: 6200

## 2019-10-20 LAB — TYPE AND SCREEN
ABO/RH(D): A POS
Antibody Screen: NEGATIVE
Unit division: 0

## 2019-10-27 NOTE — Progress Notes (Deleted)
Cardiology Office Note   Date:  10/27/2019   ID:  [name], [name] October 09, 1945, MRN [id number]  PCP:  [name], DO  Cardiologist:   Minus Breeding, MD   No chief complaint on file.     History of Present Illness: [name] [name] is a 74 y.o. male who presents for evaluation of palpitations and CAD.  He was in the hospital in October.  He had acute on chronic renal insufficiency.  He was being managed for hematuria and was having trouble with urination.  He has had multiple myeloma and renal cell cancer.  I reviewed the oncology note from last month and the patient has decided to stop all future oncology treatment.  He refused hospice.  He had his dialysis catheter removed.  He did however follow up with an echo and had moderate AI and mild MR.  He had aortic root enlargement.    He called today and is added to my schedule.  He has decreased energy and increased weakness.  He had called recently because his heart has been beating fast and he wanted to be seen sooner. I had him wear a monitor.   He had no significant arrhythmias.  He has anemia and had a transfusion last week. ***     He was in the emergency room earlier this month.  He was anemic and required transfusion. I reviewed these records for this visit.    He has felt like his heart has been skipping and at times he feels tired.  He feels thirsty.  He feels like when he is doing something was pushing something.  He feels fatigued.  He tried to walk his 15 minutes but he says that he has been making him weak.  He does have follow-up lab work scheduled after his transfusion.  He has these episodes where he feels like he is fading out after he eats.  He does not think it is falling asleep.  Has not had any syncope or trauma.  He denies any chest pressure, neck or arm discomfort.  He is not having any new PND or orthopnea.   Past Medical History:  Diagnosis Date  . Abnormal CT of liver    a. nodular contour suggesting  cirrhosis 05/2018.  [name] [name] Abnormal LFTs (liver function tests)   . Anxiety   . Aortic root dilatation ([place])   . Ascending aortic aneurysm ([place])   . BPH with urinary obstruction   . Bradycardia   . 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 CAD (coronary artery disease)    a. 12/2015 NSTEMI/Cath (in setting of PAF):  LM nl, LAD 30p,  LCX 49m RCA  ok, AM 100%, RPDA1 40, RPDA 2 60 ->Med Rx.  . Childhood asthma   . Chronic diastolic CHF (congestive heart failure) ([name] [name]    a. 12/2015 Echo: EF 50-55%, mod AI, mod Ao root dil, mild MR, mod dil LA, mod RA.  . CKD (chronic kidney disease), stage III   . Colon cancer ([name] [name]   . COPD (chronic obstructive pulmonary disease) ([name] [name]    pt denies at preop  . Diabetes mellitus type II    diet controlled  . History of syncope   . Hyperlipidemia   . Hypertensive heart disease   . Kidney lump 04/04/2010   Overview:  Renal Cell Carcinoma   . Light chain myeloma ([place]   . Moderate  aortic insufficiency    a. 12/2015 Echo: Mod AI.  [name] [name] Paroxysmal atrial fibrillation ([name] [name])    a. 12/2015 started on Xarelto (CHA2DS2VASc = 4-5).  . Pneumonia   . Prostate cancer ([name] [name])   . Renal cell carcinoma ([name] [name])   . Sleep apnea    Does not like  CPAP  . Spinal stenosis in cervical region 10/12/2016  . Venous insufficiency     Past Surgical History:  Procedure Laterality Date  . BACK SURGERY    . CARDIAC CATHETERIZATION N/A 12/18/2015   Procedure: Left Heart Cath and Coronary Angiography;  Surgeon: Leonie Man, MD;  Location: [facility];  Service: Cardiovascular;  Laterality: N/A;  . DIAGNOSTIC LAPAROSCOPY     partial colectomy  . IR FLUORO GUIDE CV LINE RIGHT  06/28/2019  . IR REMOVAL TUN CV CATH W/O FL  07/26/2019  . IR US GUIDE VASC ACCESS RIGHT  06/28/2019  . LAPAROSCOPIC PARTIAL COLECTOMY Right 05/21/2018   Procedure: LAPAROSCOPIC RIGHT  COLECTOMY ERAS PATHWAY;  Surgeon: [name] Ruff, MD;  Location: [facility];  Service: General;  Laterality: Right;  . [place]   "replaced a disc"     Current Outpatient Medications  Medication Sig Dispense Refill  . amLODipine (NORVASC) 10 MG tablet Take 1 tablet (10 mg total) by mouth daily. 90 tablet 0  . atorvastatin (LIPITOR) 40 MG tablet Take 1 tablet (40 mg total) by mouth daily. 90 tablet 3  . cetirizine (ZYRTEC) 10 MG tablet Take 1 tablet (10 mg total) by mouth daily as needed (seasonal allergies). 90 tablet 0  . glucose blood (ONETOUCH ULTRA) test strip Check FSBS twice a day. Dx: E11.21, N18.32 100 each 3  . tamsulosin (FLOMAX) 0.4 MG CAPS capsule Take 1 capsule (0.4 mg total) by mouth daily. 90 capsule 0   No current facility-administered medications for this visit.    Allergies:   Amoxicillin and Penicillins    ROS:  Please see the history of present illness.   Otherwise, review of systems are positive for ***.   All other systems are reviewed and negative.    PHYSICAL EXAM: VS:  There were no vitals taken for this visit. , BMI There is no height or weight on file to calculate BMI. GENERAL:  Well appearing NECK:  No jugular venous distention, waveform within normal limits, carotid upstroke brisk and symmetric, no bruits, no thyromegaly LUNGS:  Clear to auscultation bilaterally CHEST:  Unremarkable HEART:  PMI not displaced or sustained,S1 and S2 within normal limits, no S3, no S4, no clicks, no rubs, *** murmurs ABD:  Flat, positive bowel sounds normal in frequency in pitch, no bruits, no rebound, no guarding, no midline pulsatile mass, no hepatomegaly, no splenomegaly EXT:  2 plus pulses throughout, no edema, no cyanosis no clubbing   ***GENERAL:  Well appearing NECK:  No jugular venous distention, waveform within normal limits, carotid upstroke brisk and symmetric, no bruits, no thyromegaly LUNGS:  Clear to auscultation bilaterally CHEST:  Unremarkable HEART:  PMI not displaced or  sustained,S1 and S2 within normal limits, no S3, no S4, no clicks, no rubs, 2 out of 6 apical systolic murmur radiating slightly at the outflow tract, 2/6 diastolic murmur murmurs ABD:  Flat, positive bowel sounds normal in frequency in pitch, no bruits, no rebound, no guarding, no midline pulsatile mass, no hepatomegaly, no splenomegaly EXT:  2 plus pulses throughout, no edema, no cyanosis no clubbing   EKG:  EKG is *** ordered  today. The ekg ordered today demonstrates sinus rhythm, rate *** left axis deviation, poor anterior R wave progression, no acute ST-T wave changes.   Recent Labs: 08/23/2019: TSH 1.852 10/14/2019: ALT 17; BUN 58; Creatinine, Ser 4.41; Hemoglobin 7.9; Platelets 208; Potassium 3.6; Sodium 143    Lipid Panel    Component Value Date/Time   CHOL 189 10/14/2019 1423   TRIG 119 10/14/2019 1423   HDL 53 10/14/2019 1423   CHOLHDL 3.6 10/14/2019 1423   CHOLHDL 3.3 10/11/2016 1900   VLDL 27 10/11/2016 1900   LDLCALC 115 (H) 10/14/2019 1423      Wt Readings from Last 3 Encounters:  10/14/19 209 lb (94.8 kg)  09/24/19 213 lb (96.6 kg)  08/05/19 210 lb 12.8 oz (95.6 kg)      Other studies Reviewed: Additional studies/ records that were reviewed today include: *** Review of the above records demonstrates:  Please see elsewhere in the note.     ASSESSMENT AND PLAN:   Palpitations ***  He has some vague symptoms.  He has had normal thyroid testing and electrolytes.  I am going to apply a 2-week event monitor.  Further management will be based on this.   Anemia: ***  He is waiting to hear from his new primary provider who is planning to do a full panel of labs including CBC.  Atrial fibrillation ***  He would be high risk for anticoagulation.  The artery screening as above.   Essential (primary) hypertension ***  He has had some mildly elevated diastolic pressures with systolics have been normal.  No change in therapy.  CAD (coronary artery disease) ***   He is not having any angina.  No change in therapy.  Chronic diastolic CHF (congestive heart failure) ([place]) He seems to be euvolemic.  No change in therapy.  AI ***  He has moderate aortic insufficiency and I will manage this expectantly.  AORTIC ENLARGEMENT:   ***  He would not be a candidate for repair given his untreated comorbid diseases.    No further imaging is planned.    COVID ED:  ***   Current medicines are reviewed at length with the patient today.  The patient does not have concerns regarding medicines.  The following changes have been made:  ***  Labs/ tests ordered today include:   ***  No orders of the defined types were placed in this encounter.    Disposition:   FU with me ***   Signed, Minus Breeding, MD  10/27/2019 9:11 PM    [facility]

## 2019-10-28 ENCOUNTER — Ambulatory Visit: Payer: Medicare Other | Admitting: Cardiology

## 2019-11-12 ENCOUNTER — Other Ambulatory Visit: Payer: Self-pay | Admitting: Family Medicine

## 2019-11-12 DIAGNOSIS — J309 Allergic rhinitis, unspecified: Secondary | ICD-10-CM

## 2019-11-22 ENCOUNTER — Encounter: Payer: Self-pay | Admitting: Cardiology

## 2019-12-01 ENCOUNTER — Encounter: Payer: Self-pay | Admitting: Internal Medicine

## 2019-12-01 ENCOUNTER — Ambulatory Visit (INDEPENDENT_AMBULATORY_CARE_PROVIDER_SITE_OTHER): Payer: Medicare Other | Admitting: Internal Medicine

## 2019-12-01 DIAGNOSIS — D649 Anemia, unspecified: Secondary | ICD-10-CM

## 2019-12-01 DIAGNOSIS — K439 Ventral hernia without obstruction or gangrene: Secondary | ICD-10-CM | POA: Diagnosis not present

## 2019-12-01 NOTE — Progress Notes (Signed)
Virtual Visit via Telephone Note  I connected with [place], on 12/01/2019 at 11:09 AM by telephone due to the COVID-19 pandemic and verified that I am speaking with the correct person using two identifiers.   Consent: I discussed the limitations, risks, security and privacy concerns of performing an evaluation and management service by telephone and the availability of in person appointments. I also discussed with the patient that there may be a patient responsible charge related to this service. The patient expressed understanding and agreed to proceed.   Location of Patient: Home   Location of Provider: Clinic    Persons participating in Telemedicine visit: [name] [name] Dr. Juleen China      History of Present Illness: Patient has a follow up. Frustrated because gen surgery said they wont do a hernia repair due to other medical conditions.    Past Medical History:  Diagnosis Date  . Abnormal CT of liver    a. nodular contour suggesting cirrhosis 05/2018.  [name] Abnormal LFTs (liver function tests)   . Anxiety   . Aortic root dilatation ([place])   . Ascending aortic aneurysm ([place])   . BPH with urinary obstruction   . Bradycardia   . 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 CAD (coronary artery disease)    a. 12/2015 NSTEMI/Cath (in setting of PAF):  LM nl, LAD 30p,  LCX 16m, RCA  ok, AM 100%, RPDA1 40, RPDA 2 60 ->Med Rx.  . Childhood asthma   . Chronic diastolic CHF (congestive heart failure) ([place])    a. 12/2015 Echo: EF 50-55%, mod AI, mod Ao root dil, mild MR, mod dil LA, mod RA.  . CKD (chronic kidney disease), stage III   . Colon cancer ([place])   . COPD (chronic obstructive pulmonary disease) ([place])    pt denies at preop  . Diabetes mellitus type II    diet controlled  . History of syncope   . Hyperlipidemia   . Hypertensive heart disease   . Kidney lump 04/04/2010   Overview:   Renal Cell Carcinoma   . 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 Moderate aortic insufficiency    a. 12/2015 Echo: Mod AI.  [name] Paroxysmal atrial fibrillation ([place])    a. 12/2015 started on Xarelto (CHA2DS2VASc = 4-5).  . Pneumonia   . Prostate cancer ([place])   . Renal cell carcinoma ([place])   . Sleep apnea    Does not like  CPAP  . Spinal stenosis in cervical region 10/12/2016  . Venous insufficiency    Allergies  Allergen Reactions  . Amoxicillin Other (See Comments)    Tolerates Unasyn. Can't move Has patient had a PCN reaction causing immediate rash, facial/tongue/throat swelling, SOB or lightheadedness with hypotension: No Has patient had a PCN reaction causing severe rash involving mucus membranes or skin necrosis: No Has patient had a PCN reaction that required hospitalization No Has patient had a PCN reaction occurring within the last 10 years: No If all of the above answers are "NO", then may proceed with Cephalosporin use.   [name] Penicillins Other (See Comments)    Tolerates Unasyn. Can't move/dizziness Has patient had a PCN reaction causing immediate rash, facial/tongue/throat swelling, SOB or lightheadedness with hypotension: No Has patient had a PCN reaction causing severe rash involving mucus membranes or skin necrosis: No Has patient had a PCN reaction that required hospitalization No Has patient had a  PCN reaction occurring within the last 10 years: No If all of the above answers are "NO", then may proceed with Cephalosporin use.      Current Outpatient Medications on File Prior to Visit  Medication Sig Dispense Refill  . amLODipine (NORVASC) 10 MG tablet Take 1 tablet (10 mg total) by mouth daily. 90 tablet 0  . atorvastatin (LIPITOR) 40 MG tablet Take 1 tablet (40 mg total) by mouth daily. 90 tablet 3  . cetirizine (ZYRTEC) 10 MG tablet TAKE 1 TABLET (10 MG TOTAL) BY MOUTH DAILY AS NEEDED (SEASONAL ALLERGIES). 90 tablet 3  . glucose blood (ONETOUCH ULTRA) test strip Check FSBS  twice a day. Dx: E11.21, N18.32 100 each 3  . tamsulosin (FLOMAX) 0.4 MG CAPS capsule Take 1 capsule (0.4 mg total) by mouth daily. 90 capsule 0   No current facility-administered medications on file prior to visit.    Observations/Objective: NAD. Speaking clearly.  Work of breathing normal.  Alert and oriented. Mood appropriate.   Assessment and Plan: 1. Hernia of abdominal wall Patient would like second opinion for repair. At last office visit, hernia was reducible. His symptoms have not worsened.  - Ambulatory referral to General Surgery  2. Anemia, unspecified type Patient with HgB 7.9 one month ago, patient s/p 1uPRBC transfusion. Likely related to CKD as patient has had persistent anemia that is normocytic. Patient currently reports he does not want to see a nephrologist due to watching his wife suffer with HD in the past.  - CBC; Future   Follow Up Instructions: Lab visit 4/1    I discussed the assessment and treatment plan with the patient. The patient was provided an opportunity to ask questions and all were answered. The patient agreed with the plan and demonstrated an understanding of the instructions.   The patient was advised to call back or seek an in-person evaluation if the symptoms worsen or if the condition fails to improve as anticipated.     I provided 16 minutes total of non-face-to-face time during this encounter including median intraservice time, reviewing previous notes, investigations, ordering medications, medical decision making, coordinating care and patient verbalized understanding at the end of the visit.    Phill Myron, D.O. Primary Care at [facility]  12/01/2019, 11:09 AM

## 2019-12-02 ENCOUNTER — Other Ambulatory Visit (INDEPENDENT_AMBULATORY_CARE_PROVIDER_SITE_OTHER): Payer: Medicare Other

## 2019-12-02 ENCOUNTER — Other Ambulatory Visit: Payer: Self-pay

## 2019-12-02 DIAGNOSIS — D649 Anemia, unspecified: Secondary | ICD-10-CM | POA: Diagnosis not present

## 2019-12-02 NOTE — Progress Notes (Signed)
Patient here for repeat CBC.

## 2019-12-03 LAB — CBC
Hematocrit: 26.4 % — ABNORMAL LOW (ref 37.5–51.0)
Hemoglobin: 8.7 g/dL — ABNORMAL LOW (ref 13.0–17.7)
MCH: 32 pg (ref 26.6–33.0)
MCHC: 33 g/dL (ref 31.5–35.7)
MCV: 97 fL (ref 79–97)
Platelets: 239 10*3/uL (ref 150–450)
RBC: 2.72 x10E6/uL — CL (ref 4.14–5.80)
RDW: 14.3 % (ref 11.6–15.4)
WBC: 5.5 10*3/uL (ref 3.4–10.8)

## 2019-12-06 NOTE — Progress Notes (Signed)
Patient notified of results & recommendations. Expressed understanding. He states that he is holding off on the Nephrology appointment at this time.

## 2019-12-10 ENCOUNTER — Other Ambulatory Visit: Payer: Self-pay | Admitting: Internal Medicine

## 2019-12-10 ENCOUNTER — Encounter (HOSPITAL_COMMUNITY): Payer: Self-pay

## 2019-12-10 ENCOUNTER — Ambulatory Visit (HOSPITAL_COMMUNITY)
Admission: RE | Admit: 2019-12-10 | Discharge: 2019-12-10 | Disposition: A | Discharge status: Home / Self Care | Payer: Medicare Other | Source: Ambulatory Visit | Attending: Urgent Care | Admitting: Urgent Care

## 2019-12-10 ENCOUNTER — Other Ambulatory Visit: Payer: Self-pay

## 2019-12-10 ENCOUNTER — Telehealth: Payer: Self-pay | Admitting: Internal Medicine

## 2019-12-10 ENCOUNTER — Ambulatory Visit (HOSPITAL_COMMUNITY)
Admission: EM | Admit: 2019-12-10 | Discharge: 2019-12-10 | Disposition: A | Discharge status: Home / Self Care | Payer: Medicare Other | Attending: Urgent Care | Admitting: Urgent Care

## 2019-12-10 DIAGNOSIS — R944 Abnormal results of kidney function studies: Secondary | ICD-10-CM

## 2019-12-10 DIAGNOSIS — M7989 Other specified soft tissue disorders: Secondary | ICD-10-CM

## 2019-12-10 DIAGNOSIS — I82819 Embolism and thrombosis of superficial veins of unspecified lower extremities: Secondary | ICD-10-CM | POA: Diagnosis not present

## 2019-12-10 DIAGNOSIS — C649 Malignant neoplasm of unspecified kidney, except renal pelvis: Secondary | ICD-10-CM

## 2019-12-10 DIAGNOSIS — M79609 Pain in unspecified limb: Secondary | ICD-10-CM | POA: Diagnosis not present

## 2019-12-10 DIAGNOSIS — M79651 Pain in right thigh: Secondary | ICD-10-CM

## 2019-12-10 DIAGNOSIS — I4891 Unspecified atrial fibrillation: Secondary | ICD-10-CM

## 2019-12-10 DIAGNOSIS — I809 Phlebitis and thrombophlebitis of unspecified site: Secondary | ICD-10-CM

## 2019-12-10 DIAGNOSIS — C9000 Multiple myeloma not having achieved remission: Secondary | ICD-10-CM

## 2019-12-10 DIAGNOSIS — M79604 Pain in right leg: Secondary | ICD-10-CM

## 2019-12-10 NOTE — ED Triage Notes (Signed)
Pt c/o 10/10 right groin pain and right leg pain. Pt states he's been dealing with the pain for a while, but it got worse this morning. Pt states it hurts to bend his leg and he had to drive with 2 feet today. Pt states it also hurts to walk. Pt limped to triage room.

## 2019-12-10 NOTE — ED Notes (Signed)
Called vascular lab to schedule u/s of bilat lower extremities, left message on voicemail/machine. Vascular called Jaynee Eagles back and said pt has appt at 1700. Transported pt via WC to cardiovascular lab.

## 2019-12-10 NOTE — Progress Notes (Signed)
Right lower extremity venous duplex completed. Refer to "CV Proc" under chart review to view preliminary results.  12/10/2019 6:04 PM Kelby Aline., MHA, RVT, RDCS, RDMS

## 2019-12-10 NOTE — ED Provider Notes (Addendum)
[place]   MRN: [id number] DOB: May 04, 1946  Subjective:   [name] [name] is a 74 y.o. male presenting for 1-2 week hx of recurrent right groin pain. Has worsened burning sensation of lower medial thigh close to the knee. Has had this problem before. Denies fever, dysuria, urinary frequency, hematuria, testicular pain, chest pain, shob, abdominal pain. Denies fall, trauma.  Tried to take hydrocodone left over from when he had gout, did help for a while but is not resolving. Has a hx of BPH, uses Flomax for this. Has a hx of prostate cancer, renal cell carcinoma, multiple myeloma. Has a hx of atrial fibrillation, chronic CHF. Patient actually stopped cancer treatment due to not being able to tolerate it, was getting chemotherapy and dialysis now he's not since stopping treatment. Has not followed up with his oncologist since last year. Has not had a blood clot before.   No current facility-administered medications for this encounter.  Current Outpatient Medications:  .  amLODipine (NORVASC) 10 MG tablet, Take 1 tablet (10 mg total) by mouth daily., Disp: 90 tablet, Rfl: 0 .  atorvastatin (LIPITOR) 40 MG tablet, Take 1 tablet (40 mg total) by mouth daily., Disp: 90 tablet, Rfl: 3 .  cetirizine (ZYRTEC) 10 MG tablet, TAKE 1 TABLET (10 MG TOTAL) BY MOUTH DAILY AS NEEDED (SEASONAL ALLERGIES)., Disp: 90 tablet, Rfl: 3 .  glucose blood (ONETOUCH ULTRA) test strip, Check FSBS twice a day. Dx: E11.21, N18.32, Disp: 100 each, Rfl: 3 .  tamsulosin (FLOMAX) 0.4 MG CAPS capsule, Take 1 capsule (0.4 mg total) by mouth daily., Disp: 90 capsule, Rfl: 0    Allergies  Allergen Reactions  . Amoxicillin Other (See Comments)    Tolerates Unasyn. Can't move Has patient had a PCN reaction causing immediate rash, facial/tongue/throat swelling, SOB or lightheadedness with hypotension: No Has patient had a PCN reaction causing severe rash involving mucus membranes or skin necrosis: No Has patient had a PCN  reaction that required hospitalization No Has patient had a PCN reaction occurring within the last 10 years: No If all of the above answers are "NO", then may proceed with Cephalosporin use.   [name] Penicillins Other (See Comments)    Tolerates Unasyn. Can't move/dizziness Has patient had a PCN reaction causing immediate rash, facial/tongue/throat swelling, SOB or lightheadedness with hypotension: No Has patient had a PCN reaction causing severe rash involving mucus membranes or skin necrosis: No Has patient had a PCN reaction that required hospitalization No Has patient had a PCN reaction occurring within the last 10 years: No If all of the above answers are "NO", then may proceed with Cephalosporin use.      Past Medical History:  Diagnosis Date  . Abnormal CT of liver    a. nodular contour suggesting cirrhosis 05/2018.  [name] Abnormal LFTs (liver function tests)   . Anxiety   . Aortic root dilatation ([place])   . Ascending aortic aneurysm ([facility])   . BPH with urinary obstruction   . Bradycardia   . 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 CAD (coronary artery disease)    a. 12/2015 NSTEMI/Cath (in setting of PAF):  LM nl, LAD 30p,  LCX 36m RCA  ok, AM 100%, RPDA1 40, RPDA 2 60 ->Med Rx.  . Childhood asthma   . Chronic diastolic CHF (congestive heart failure) ([place]    a. 12/2015 Echo: EF 50-55%, mod AI, mod Ao root dil,  mild MR, mod dil LA, mod RA.  . CKD (chronic kidney disease), stage III   . Colon cancer ([place])   . COPD (chronic obstructive pulmonary disease) ([place])    pt denies at preop  . Diabetes mellitus type II    diet controlled  . History of syncope   . Hyperlipidemia   . Hypertensive heart disease   . Kidney lump 04/04/2010   Overview:  Renal Cell Carcinoma   . 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 Moderate aortic insufficiency    a. 12/2015 Echo: Mod AI.  [name] Paroxysmal atrial fibrillation ([place])    a.  12/2015 started on Xarelto (CHA2DS2VASc = 4-5).  . Pneumonia   . Prostate cancer ([place])   . Renal cell carcinoma ([place])   . Sleep apnea    Does not like  CPAP  . Spinal stenosis in cervical region 10/12/2016  . Venous insufficiency      Past Surgical History:  Procedure Laterality Date  . BACK SURGERY    . CARDIAC CATHETERIZATION N/A 12/18/2015   Procedure: Left Heart Cath and Coronary Angiography;  Surgeon: Leonie Man, MD;  Location: [facility];  Service: Cardiovascular;  Laterality: N/A;  . DIAGNOSTIC LAPAROSCOPY     partial colectomy  . IR FLUORO GUIDE CV LINE RIGHT  06/28/2019  . IR REMOVAL TUN CV CATH W/O FL  07/26/2019  . IR US GUIDE VASC ACCESS RIGHT  06/28/2019  . LAPAROSCOPIC PARTIAL COLECTOMY Right 05/21/2018   Procedure: LAPAROSCOPIC RIGHT  COLECTOMY ERAS PATHWAY;  Surgeon: Leighton Ruff, MD;  Location: [facility];  Service: General;  Laterality: Right;  . [name]   "replaced a disc"    Family History  Problem Relation Age of Onset  . Emphysema Father   . Asthma Father   . Liver disease Father        tumor  . Heart disease Mother   . Hypertension Mother   . Asthma Sister   . Hypertension Son     Social History   Tobacco Use  . Smoking status: Former Smoker    Types: Cigars    Quit date: 09/02/1977    Years since quitting: 42.2  . Smokeless tobacco: Never Used  . Tobacco comment:    Substance Use Topics  . Alcohol use: Not Currently    Comment: "stopped drinking alcohol in ~ 1980; just drank a little on the weekends when I did drink"  . Drug use: No    ROS   Objective:   Vitals: BP 133/83   Pulse 85   Temp 98.1 F (36.7 C) (Oral)   Resp 16   Ht '5\' 9"'$  (1.753 m)   Wt 201 lb (91.2 kg)   SpO2 100%   BMI 29.68 kg/m   Physical Exam Constitutional:      General: He is not in acute distress.    Appearance: Normal appearance. He is well-developed. He is not ill-appearing, toxic-appearing or diaphoretic.  HENT:     Head:  Normocephalic and atraumatic.     Right Ear: External ear normal.     Left Ear: External ear normal.     Nose: Nose normal.     Mouth/Throat:     Mouth: Mucous membranes are moist.     Pharynx: Oropharynx is clear.  Eyes:     General: No scleral icterus.    Extraocular Movements: Extraocular movements intact.     Pupils: Pupils are equal, round, and reactive to light.  Cardiovascular:     Rate and Rhythm: Normal rate and regular rhythm.     Heart sounds: Normal heart sounds. No murmur. No friction rub. No gallop.   Pulmonary:     Effort: Pulmonary effort is normal. No respiratory distress.     Breath sounds: Normal breath sounds. No stridor. No wheezing, rhonchi or rales.  Musculoskeletal:       Legs:  Neurological:     Mental Status: He is alert and oriented to person, place, and time.  Psychiatric:        Mood and Affect: Mood normal.        Behavior: Behavior normal.        Thought Content: Thought content normal.        Judgment: Judgment normal.     Assessment and Plan :   1. Right thigh pain   2. Thrombophlebitis   3. Multiple myeloma, remission status unspecified ([place])   4. Renal cell carcinoma, unspecified laterality ([place])   5. Atrial fibrillation, unspecified type ([place])   6. Decreased GFR     Patient will have an U/S now with [facility] vascular. Counseled patient on potential for adverse effects with medications prescribed/recommended today, ER and return-to-clinic precautions discussed, patient verbalized understanding.   Jaynee Eagles, [place] 12/10/19 1654

## 2019-12-10 NOTE — Telephone Encounter (Signed)
Spoke with patient. He states that his leg has been hurting him for a long time but today it felt a little worse. He says that he was out running a lot of errands yesterday & isn't sure if that's why it's bothering him.  He is able to lift the right leg but it worsens the pain so he was using his left leg as well while driving.  States that the pain is getting better now that he's home & resting. He is aware to go be evaluated if pain becomes severe or if he experiences weakness.

## 2019-12-10 NOTE — Telephone Encounter (Signed)
Hi Kierra,  Will you please contact [name] about his call? I have placed a referral to ortho. However, [place]'s note mentions that he can't lift his right leg. If this is a new onset issue and he feels weakness he needs to be evaluated urgently. If he feels like this is chronic and the inability to lift the leg is due to pain instead of actual weakness he can follow up with the ortho doctors.   Thanks!  Phill Myron, D.O. Primary Care at [facility]  12/10/2019, 11:41 AM

## 2019-12-10 NOTE — Discharge Instructions (Addendum)
Please over to the [facility] now, go directly to Admissions for your outpatient ultrasound. Your appointment is at 5:00pm.

## 2019-12-10 NOTE — Telephone Encounter (Signed)
Patient is complaining of right leg pain, states he cannot move or lift right leg up. He would like to either be seen or a referral to be placed.   Please contact patient as he states he is driving with both feet due to not being able to lift his right leg.

## 2019-12-23 ENCOUNTER — Encounter (HOSPITAL_COMMUNITY): Payer: Self-pay

## 2019-12-23 ENCOUNTER — Ambulatory Visit (HOSPITAL_COMMUNITY)
Admission: EM | Admit: 2019-12-23 | Discharge: 2019-12-23 | Disposition: A | Discharge status: Home / Self Care | Payer: Medicare Other | Attending: Family Medicine | Admitting: Family Medicine

## 2019-12-23 ENCOUNTER — Other Ambulatory Visit: Payer: Self-pay

## 2019-12-23 DIAGNOSIS — R531 Weakness: Secondary | ICD-10-CM | POA: Insufficient documentation

## 2019-12-23 LAB — CBC WITH DIFFERENTIAL/PLATELET
Abs Immature Granulocytes: 0.02 10*3/uL (ref 0.00–0.07)
Basophils Absolute: 0 10*3/uL (ref 0.0–0.1)
Basophils Relative: 0 %
Eosinophils Absolute: 0.1 10*3/uL (ref 0.0–0.5)
Eosinophils Relative: 2 %
HCT: 23.6 % — ABNORMAL LOW (ref 39.0–52.0)
Hemoglobin: 7.8 g/dL — ABNORMAL LOW (ref 13.0–17.0)
Immature Granulocytes: 0 %
Lymphocytes Relative: 18 %
Lymphs Abs: 1 10*3/uL (ref 0.7–4.0)
MCH: 32.1 pg (ref 26.0–34.0)
MCHC: 33.1 g/dL (ref 30.0–36.0)
MCV: 97.1 fL (ref 80.0–100.0)
Monocytes Absolute: 0.5 10*3/uL (ref 0.1–1.0)
Monocytes Relative: 9 %
Neutro Abs: 3.7 10*3/uL (ref 1.7–7.7)
Neutrophils Relative %: 71 %
Platelets: 231 10*3/uL (ref 150–400)
RBC: 2.43 MIL/uL — ABNORMAL LOW (ref 4.22–5.81)
RDW: 13.6 % (ref 11.5–15.5)
WBC: 5.3 10*3/uL (ref 4.0–10.5)
nRBC: 0 % (ref 0.0–0.2)

## 2019-12-23 LAB — COMPREHENSIVE METABOLIC PANEL
ALT: 12 U/L (ref 0–44)
AST: 16 U/L (ref 15–41)
Albumin: 3.6 g/dL (ref 3.5–5.0)
Alkaline Phosphatase: 72 U/L (ref 38–126)
Anion gap: 12 (ref 5–15)
BUN: 73 mg/dL — ABNORMAL HIGH (ref 8–23)
CO2: 22 mmol/L (ref 22–32)
Calcium: 9.1 mg/dL (ref 8.9–10.3)
Chloride: 103 mmol/L (ref 98–111)
Creatinine, Ser: 5.1 mg/dL — ABNORMAL HIGH (ref 0.61–1.24)
GFR calc Af Amer: 12 mL/min — ABNORMAL LOW (ref >60)
GFR calc non Af Amer: 10 mL/min — ABNORMAL LOW (ref >60)
Glucose, Bld: 98 mg/dL (ref 70–99)
Potassium: 3.4 mmol/L — ABNORMAL LOW (ref 3.5–5.1)
Sodium: 137 mmol/L (ref 135–145)
Total Bilirubin: 0.4 mg/dL (ref 0.3–1.2)
Total Protein: 6.6 g/dL (ref 6.5–8.1)

## 2019-12-23 NOTE — ED Triage Notes (Signed)
Pt c/o weakness and fatiguex2 wks. Pt wants bw. Pt states the last time he was like this he had to get a unit of blood.

## 2019-12-23 NOTE — ED Provider Notes (Signed)
[place]    CSN: [id number] Arrival date & time: 12/23/19  1435       History   Chief Complaint Chief Complaint  Patient presents with  . Weakness    HPI [name] [name] is a 74 y.o. male.   HPI Patient is a 74 year old male with past medical history of anxiety, descending aortic aneurysm, BPH, CAD, asthma, chronic diastolic heart failure, CKD, colon cancer, COPD, diabetes, hyperlipidemia, hypertensive heart disease, anemia.  He presents today with approximately 2 weeks of fatigue, overall weakness.  Symptoms have been constant, waxing waning.  Reporting less energy when he takes his walks.  Denies any associated cough, chest congestion, fever, nasal congestion or rhinorrhea.  Denies any abdominal pain, nausea or vomiting.  The weakness is generalized.  No associated numbness, tingling, slurred speech.  No dysuria, hematuria urinary frequency. Had Covid approximately 2 months ago and was given blood transfusion back in January of this year for low hemoglobin.  ROS per HPI  Past Medical History:  Diagnosis Date  . Abnormal CT of liver    a. nodular contour suggesting cirrhosis 05/2018.  [name] Abnormal LFTs (liver function tests)   . Anxiety   . Aortic root dilatation ([place])   . Ascending aortic aneurysm ([place])   . BPH with urinary obstruction   . Bradycardia   . 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 CAD (coronary artery disease)    a. 12/2015 NSTEMI/Cath (in setting of PAF):  LM nl, LAD 30p,  LCX 57m, RCA  ok, AM 100%, RPDA1 40, RPDA 2 60 ->Med Rx.  . Childhood asthma   . Chronic diastolic CHF (congestive heart failure) ([place])    a. 12/2015 Echo: EF 50-55%, mod AI, mod Ao root dil, mild MR, mod dil LA, mod RA.  . CKD (chronic kidney disease), stage III   . Colon cancer ([place])   . COPD (chronic obstructive pulmonary disease) ([place])    pt denies at preop  . Diabetes mellitus type II    diet  controlled  . History of syncope   . Hyperlipidemia   . Hypertensive heart disease   . Kidney lump 04/04/2010   Overview:  Renal Cell Carcinoma   . 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 Moderate aortic insufficiency    a. 12/2015 Echo: Mod AI.  [name] Paroxysmal atrial fibrillation ([place])    a. 12/2015 started on Xarelto (CHA2DS2VASc = 4-5).  . Pneumonia   . Prostate cancer ([place])   . Renal cell carcinoma ([place])   . Sleep apnea    Does not like  CPAP  . Spinal stenosis in cervical region 10/12/2016  . Venous insufficiency     Patient Active Problem List   Diagnosis Date Noted  . Ascending aortic aneurysm ([place]) 08/04/2019  . Myeloma kidney ([place])   . CKD (chronic kidney disease), stage III 06/04/2019  . Renal cell carcinoma ([place]) 06/04/2019  . Gross hematuria 06/04/2019  . COPD exacerbation ([place]) 11/05/2018  . Palpitations 10/22/2018  . Counseling regarding advance care planning and goals of care 06/22/2018  . AKI (acute kidney injury) ([place])   . Anemia   . Sepsis ([place]) 06/05/2018  . Acute lower UTI 06/05/2018  . Colon polyp 05/21/2018  . Aortic valve regurgitation 04/14/2018  . Medication management 04/14/2018  . Thoracic aortic aneurysm without rupture ([place]) 09/29/2017  . 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 Head trauma, subsequent encounter 10/12/2016  . 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 Spinal stenosis in cervical region 10/12/2016  . Cervical spondylosis 10/12/2016  . CAD (coronary artery disease)   . Chronic diastolic CHF (congestive heart failure) ([place])   . Hyperlipidemia   . Hypertensive heart disease   . Paroxysmal atrial fibrillation ([place]) 12/17/2015  . 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 Mild intermittent asthma 10/18/2015  . Aortic root dilatation ([place]) 10/17/2015  . Diabetes ([place]) 10/17/2015  . 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 Hand  paresthesia 12/09/2014  . Elevated prostate specific antigen (PSA) 07/12/2014  . Allergic rhinitis 07/12/2014  . LBP (low back pain) 07/12/2014  . Palpitation 07/11/2014  . Patient's other noncompliance with medication regimen 08/10/2013  . Chronic venous insufficiency 07/23/2012  . Obstructive apnea 04/11/2011  . Essential (primary) hypertension 09/07/2010  . 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 Anxiety, generalized 04/04/2010  . Cardiac conduction disorder 02/27/2010  . BPH (benign prostatic hyperplasia) 09/13/2009  . Abnormal findings on examination of genitourinary organs 08/09/2009  . Hereditary and idiopathic neuropathy 07/05/2009    Past Surgical History:  Procedure Laterality Date  . BACK SURGERY    . CARDIAC CATHETERIZATION N/A 12/18/2015   Procedure: Left Heart Cath and Coronary Angiography;  Surgeon: Leonie Man, MD;  Location: [facility];  Service: Cardiovascular;  Laterality: N/A;  . DIAGNOSTIC LAPAROSCOPY     partial colectomy  . IR FLUORO GUIDE CV LINE RIGHT  06/28/2019  . IR REMOVAL TUN CV CATH W/O FL  07/26/2019  . IR US GUIDE VASC ACCESS RIGHT  06/28/2019  . LAPAROSCOPIC PARTIAL COLECTOMY Right 05/21/2018   Procedure: LAPAROSCOPIC RIGHT  COLECTOMY ERAS PATHWAY;  Surgeon: Leighton Ruff, MD;  Location: [facility];  Service: General;  Laterality: Right;  . [place]   "replaced a disc"       Home Medications    Prior to Admission medications   Medication Sig Start Date End Date Taking? Authorizing Provider  amLODipine (NORVASC) 10 MG tablet Take 1 tablet (10 mg total) by mouth daily. 10/15/19   [name], DO  cetirizine (ZYRTEC) 10 MG tablet TAKE 1 TABLET (10 MG TOTAL) BY MOUTH DAILY AS NEEDED (SEASONAL ALLERGIES). 11/12/19   [name], DO  glucose blood (ONETOUCH ULTRA) test strip Check FSBS twice a day. Dx: E11.21, N18.32 08/16/19   Fulp, Ander Gaster, MD  tamsulosin (FLOMAX) 0.4 MG CAPS  capsule Take 1 capsule (0.4 mg total) by mouth daily. 10/15/19   [name], DO  atorvastatin (LIPITOR) 40 MG tablet Take 1 tablet (40 mg total) by mouth daily. 10/15/19 12/23/19  [name], DO    Family History Family History  Problem Relation Age of Onset  . Emphysema Father   . Asthma Father   . Liver disease Father        tumor  . Heart disease Mother   . Hypertension Mother   . Asthma Sister   . Hypertension Son     Social History Social History   Tobacco Use  . Smoking status: Former Smoker    Types: Cigars    Quit date: 09/02/1977    Years since quitting: 42.3  . Smokeless tobacco: Never Used  . Tobacco comment:    Substance Use Topics  . Alcohol use: Not Currently    Comment: "stopped drinking alcohol in ~  1980; just drank a little on the weekends when I did drink"  . Drug use: No     Allergies   Amoxicillin and Penicillins   Review of Systems Review of Systems   Physical Exam Triage Vital Signs ED Triage Vitals [12/23/19 1528]  Enc Vitals Group     BP (!) 143/82     Pulse Rate 88     Resp 16     Temp 98.7 F (37.1 C)     Temp Source Oral     SpO2 100 %     Weight 190 lb (86.2 kg)     Height 5\' 9"  (1.753 m)     Head Circumference      Peak Flow      Pain Score 0     Pain Loc      Pain Edu?      Excl. in [place]?    No data found.  Updated Vital Signs BP (!) 143/82   Pulse 88   Temp 98.7 F (37.1 C) (Oral)   Resp 16   Ht 5\' 9"  (1.753 m)   Wt 190 lb (86.2 kg)   SpO2 100%   BMI 28.06 kg/m   Visual Acuity Right Eye Distance:   Left Eye Distance:   Bilateral Distance:    Right Eye Near:   Left Eye Near:    Bilateral Near:     Physical Exam Vitals and nursing note reviewed.  Constitutional:      Appearance: Normal appearance.  HENT:     Head: Normocephalic and atraumatic.     Nose: Nose normal.  Eyes:     Conjunctiva/sclera: Conjunctivae normal.  Musculoskeletal:        General: Normal range of  motion.     Cervical back: Normal range of motion.  Skin:    General: Skin is warm and dry.  Neurological:     Mental Status: He is alert.  Psychiatric:        Mood and Affect: Mood normal.      UC Treatments / Results  Labs (all labs ordered are listed, but only abnormal results are displayed) Labs Reviewed  CBC WITH DIFFERENTIAL/PLATELET  COMPREHENSIVE METABOLIC PANEL    EKG   Radiology No results found.  Procedures Procedures (including critical care time)  Medications Ordered in UC Medications - No data to display  Initial Impression / Assessment and Plan / UC Course  I have reviewed the triage vital signs and the nursing notes.  Pertinent labs & imaging results that were available during my care of the patient were reviewed by me and considered in my medical decision making (see chart for details).     Weakness, fatigue without any other associated concerning symptoms. History of anemia with blood transfusion back in January We will go ahead and draw CBC today to rule out anemia and need for blood transfusion. Labs pending Otherwise vital signs stable and his exam is benign Final Clinical Impressions(s) / UC Diagnoses   Final diagnoses:  Weakness     Discharge Instructions     We are checking your blood levels We will call you with your results.     ED Prescriptions    None     PDMP not reviewed this encounter.   Loura Halt A, NP 12/23/19 1550

## 2019-12-23 NOTE — Discharge Instructions (Addendum)
We are checking your blood levels We will call you with your results.

## 2019-12-24 ENCOUNTER — Other Ambulatory Visit: Payer: Self-pay

## 2019-12-24 ENCOUNTER — Encounter (HOSPITAL_COMMUNITY): Payer: Self-pay

## 2019-12-24 ENCOUNTER — Emergency Department (HOSPITAL_COMMUNITY)
Admission: EM | Admit: 2019-12-24 | Discharge: 2019-12-24 | Disposition: A | Discharge status: Home / Self Care | Payer: Medicare Other | Attending: Emergency Medicine | Admitting: Emergency Medicine

## 2019-12-24 DIAGNOSIS — R791 Abnormal coagulation profile: Secondary | ICD-10-CM | POA: Diagnosis not present

## 2019-12-24 DIAGNOSIS — I5032 Chronic diastolic (congestive) heart failure: Secondary | ICD-10-CM | POA: Diagnosis not present

## 2019-12-24 DIAGNOSIS — J449 Chronic obstructive pulmonary disease, unspecified: Secondary | ICD-10-CM | POA: Diagnosis not present

## 2019-12-24 DIAGNOSIS — Z79899 Other long term (current) drug therapy: Secondary | ICD-10-CM | POA: Diagnosis not present

## 2019-12-24 DIAGNOSIS — Z87891 Personal history of nicotine dependence: Secondary | ICD-10-CM | POA: Insufficient documentation

## 2019-12-24 DIAGNOSIS — N183 Chronic kidney disease, stage 3 unspecified: Secondary | ICD-10-CM | POA: Diagnosis not present

## 2019-12-24 DIAGNOSIS — R531 Weakness: Secondary | ICD-10-CM | POA: Diagnosis present

## 2019-12-24 DIAGNOSIS — I2510 Atherosclerotic heart disease of native coronary artery without angina pectoris: Secondary | ICD-10-CM | POA: Insufficient documentation

## 2019-12-24 DIAGNOSIS — I130 Hypertensive heart and chronic kidney disease with heart failure and stage 1 through stage 4 chronic kidney disease, or unspecified chronic kidney disease: Secondary | ICD-10-CM | POA: Diagnosis not present

## 2019-12-24 DIAGNOSIS — D649 Anemia, unspecified: Secondary | ICD-10-CM | POA: Diagnosis not present

## 2019-12-24 LAB — COMPREHENSIVE METABOLIC PANEL
ALT: 12 U/L (ref 0–44)
AST: 14 U/L — ABNORMAL LOW (ref 15–41)
Albumin: 3.6 g/dL (ref 3.5–5.0)
Alkaline Phosphatase: 73 U/L (ref 38–126)
Anion gap: 14 (ref 5–15)
BUN: 73 mg/dL — ABNORMAL HIGH (ref 8–23)
CO2: 21 mmol/L — ABNORMAL LOW (ref 22–32)
Calcium: 9.3 mg/dL (ref 8.9–10.3)
Chloride: 103 mmol/L (ref 98–111)
Creatinine, Ser: 4.95 mg/dL — ABNORMAL HIGH (ref 0.61–1.24)
GFR calc Af Amer: 12 mL/min — ABNORMAL LOW (ref >60)
GFR calc non Af Amer: 11 mL/min — ABNORMAL LOW (ref >60)
Glucose, Bld: 100 mg/dL — ABNORMAL HIGH (ref 70–99)
Potassium: 3.3 mmol/L — ABNORMAL LOW (ref 3.5–5.1)
Sodium: 138 mmol/L (ref 135–145)
Total Bilirubin: 0.4 mg/dL (ref 0.3–1.2)
Total Protein: 6.8 g/dL (ref 6.5–8.1)

## 2019-12-24 LAB — CBC
HCT: 24 % — ABNORMAL LOW (ref 39.0–52.0)
Hemoglobin: 8 g/dL — ABNORMAL LOW (ref 13.0–17.0)
MCH: 32.9 pg (ref 26.0–34.0)
MCHC: 33.3 g/dL (ref 30.0–36.0)
MCV: 98.8 fL (ref 80.0–100.0)
Platelets: 232 10*3/uL (ref 150–400)
RBC: 2.43 MIL/uL — ABNORMAL LOW (ref 4.22–5.81)
RDW: 13.9 % (ref 11.5–15.5)
WBC: 5.3 10*3/uL (ref 4.0–10.5)
nRBC: 0 % (ref 0.0–0.2)

## 2019-12-24 LAB — PREPARE RBC (CROSSMATCH)

## 2019-12-24 LAB — PROTIME-INR
INR: 1 (ref 0.8–1.2)
Prothrombin Time: 13.3 seconds (ref 11.4–15.2)

## 2019-12-24 LAB — POC OCCULT BLOOD, ED: Fecal Occult Bld: NEGATIVE

## 2019-12-24 MED ORDER — SODIUM CHLORIDE 0.9 % IV SOLN
10.0000 mL/h | Freq: Once | INTRAVENOUS | Status: AC
  Administered 2019-12-24: 10 mL/h via INTRAVENOUS

## 2019-12-24 NOTE — ED Notes (Signed)
Patient verbalizes understanding of discharge instructions. Opportunity for questioning and answers were provided. Armband removed by staff, pt discharged from ED to home in [place]. At discharge, pt denies dizziness, is A&Ox4, able to dress and ambulate independently, denies SOB.

## 2019-12-24 NOTE — Discharge Instructions (Addendum)
You were evaluated in the Emergency Department and after careful evaluation, we did not find any emergent condition requiring admission or further testing in the hospital.  Your exam/testing today is overall reassuring.  We gave you a unit of blood here in the emergency department.  Please follow-up with your regular doctors.  Please return to the Emergency Department if you experience any worsening of your condition.  We encourage you to follow up with a primary care provider.  Thank you for allowing [place] to be a part of your care.

## 2019-12-24 NOTE — ED Triage Notes (Signed)
Pt presents with weakness x2 weeks, seen at [facility] yesterday, he left and went home, they called him last night and told him his "blood was low"

## 2019-12-24 NOTE — ED Provider Notes (Signed)
  Provider Note MRN:  [id number]  Arrival date & time: 12/24/19    ED Course and Medical Decision Making  Assumed care from Dr. Alvino Chapel at shift change.  Recurrent anemia likely due to CKD, becomes symptomatic when less than 8.  Hemoccult stool was negative today.  Plan to transfuse 1 unit and discharge.  Transfusion complete without complication, appropriate for discharge.  .Critical Care Performed by: Maudie Flakes, MD Authorized by: Maudie Flakes, MD   Critical care provider statement:    Critical care time (minutes):  32   Critical care was necessary to treat or prevent imminent or life-threatening deterioration of the following conditions: Symptomatic anemia requiring blood transfusion.   Critical care was time spent personally by me on the following activities:  Discussions with consultants, evaluation of patient's response to treatment, examination of patient, ordering and performing treatments and interventions, ordering and review of laboratory studies, ordering and review of radiographic studies, pulse oximetry, re-evaluation of patient's condition, obtaining history from patient or surrogate and review of old charts   I assumed direction of critical care for this patient from another provider in my specialty: yes      Final Clinical Impressions(s) / ED Diagnoses     ICD-10-CM   1. Anemia, unspecified type  D64.9   2. Symptomatic anemia  D64.9     ED Discharge Orders    None        Discharge Instructions     You were evaluated in the Emergency Department and after careful evaluation, we did not find any emergent condition requiring admission or further testing in the hospital.  Your exam/testing today is overall reassuring.  We gave you a unit of blood here in the emergency department.  Please follow-up with your regular doctors.  Please return to the Emergency Department if you experience any worsening of your condition.  We encourage you to follow up with a  primary care provider.  Thank you for allowing [place] to be a part of your care.     Barth Kirks. [place]onia Small, [place] [email or web address]    Maudie Flakes, MD 12/24/19 [phone]

## 2019-12-24 NOTE — ED Provider Notes (Signed)
[facility] EMERGENCY DEPARTMENT Provider Note   CSN: [id number] Arrival date & time: 12/24/19  6761     History Chief Complaint  Patient presents with  . Weakness    [name] [name] is a 74 y.o. male.  HPI Patient presents with anemia.  History of same.  Thought to be secondary to kidney disease.  States he questionably has had some blood in the stool.  Has been more fatigued over the last week or 2.  Went to see urgent care yesterday found to have hemoglobin of 7.8 told to come in.  No chest pain.  States he has follow-up with his PCP to work this out more.  Had not wanted to see nephrology since his wife had done poorly on dialysis.  Patient is on Xarelto for atrial fibrillation.    Past Medical History:  Diagnosis Date  . Abnormal CT of liver    a. nodular contour suggesting cirrhosis 05/2018.  [name] Abnormal LFTs (liver function tests)   . Anxiety   . Aortic root dilatation ([place])   . Ascending aortic aneurysm ([place])   . BPH with urinary obstruction   . Bradycardia   . 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 CAD (coronary artery disease)    a. 12/2015 NSTEMI/Cath (in setting of PAF):  LM nl, LAD 30p,  LCX 45m, RCA  ok, AM 100%, RPDA1 40, RPDA 2 60 ->Med Rx.  . Childhood asthma   . Chronic diastolic CHF (congestive heart failure) ([place])    a. 12/2015 Echo: EF 50-55%, mod AI, mod Ao root dil, mild MR, mod dil LA, mod RA.  . CKD (chronic kidney disease), stage III   . Colon cancer ([place])   . COPD (chronic obstructive pulmonary disease) ([place])    pt denies at preop  . Diabetes mellitus type II    diet controlled  . History of syncope   . Hyperlipidemia   . Hypertensive heart disease   . Kidney lump 04/04/2010   Overview:  Renal Cell Carcinoma   . 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 Moderate aortic insufficiency    a. 12/2015 Echo: Mod AI.  [name] Paroxysmal atrial fibrillation ([place])    a. 12/2015  started on Xarelto (CHA2DS2VASc = 4-5).  . Pneumonia   . Prostate cancer ([place])   . Renal cell carcinoma ([place])   . Sleep apnea    Does not like  CPAP  . Spinal stenosis in cervical region 10/12/2016  . Venous insufficiency     Patient Active Problem List   Diagnosis Date Noted  . Ascending aortic aneurysm ([place]) 08/04/2019  . 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 CKD (chronic kidney disease), stage III 06/04/2019  . Renal cell carcinoma ([place]) 06/04/2019  . Gross hematuria 06/04/2019  . COPD exacerbation ([place]) 11/05/2018  . Palpitations 10/22/2018  . Counseling regarding advance care planning and goals of care 06/22/2018  . AKI (acute kidney injury) ([place])   . Anemia   . Sepsis ([place]) 06/05/2018  . Acute lower UTI 06/05/2018  . Colon polyp 05/21/2018  . Aortic valve regurgitation 04/14/2018  . Medication management 04/14/2018  . Thoracic aortic aneurysm without rupture ([place]) 09/29/2017  . 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 Head trauma, subsequent encounter 10/12/2016  . 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 Spinal stenosis in cervical  region 10/12/2016  . Cervical spondylosis 10/12/2016  . CAD (coronary artery disease)   . Chronic diastolic CHF (congestive heart failure) ([place])   . Hyperlipidemia   . Hypertensive heart disease   . Paroxysmal atrial fibrillation ([place]) 12/17/2015  . 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 Mild intermittent asthma 10/18/2015  . Aortic root dilatation ([place]) 10/17/2015  . Diabetes ([place]) 10/17/2015  . 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 Hand paresthesia 12/09/2014  . Elevated prostate specific antigen (PSA) 07/12/2014  . Allergic rhinitis 07/12/2014  . LBP (low back pain) 07/12/2014  . Palpitation 07/11/2014  . Patient's other noncompliance with medication regimen 08/10/2013  . Chronic venous insufficiency 07/23/2012  .  Obstructive apnea 04/11/2011  . Essential (primary) hypertension 09/07/2010  . 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 Anxiety, generalized 04/04/2010  . Cardiac conduction disorder 02/27/2010  . BPH (benign prostatic hyperplasia) 09/13/2009  . Abnormal findings on examination of genitourinary organs 08/09/2009  . Hereditary and idiopathic neuropathy 07/05/2009    Past Surgical History:  Procedure Laterality Date  . BACK SURGERY    . CARDIAC CATHETERIZATION N/A 12/18/2015   Procedure: Left Heart Cath and Coronary Angiography;  Surgeon: Leonie Man, MD;  Location: [place] CV LAB;  Service: Cardiovascular;  Laterality: N/A;  . DIAGNOSTIC LAPAROSCOPY     partial colectomy  . IR FLUORO GUIDE CV LINE RIGHT  06/28/2019  . IR REMOVAL TUN CV CATH W/O FL  07/26/2019  . IR US GUIDE VASC ACCESS RIGHT  06/28/2019  . LAPAROSCOPIC PARTIAL COLECTOMY Right 05/21/2018   Procedure: LAPAROSCOPIC RIGHT  COLECTOMY ERAS PATHWAY;  Surgeon: Leighton Ruff, MD;  Location: [facility];  Service: General;  Laterality: Right;  . [place]   "replaced a disc"       Family History  Problem Relation Age of Onset  . Emphysema Father   . Asthma Father   . Liver disease Father        tumor  . Heart disease Mother   . Hypertension Mother   . Asthma Sister   . Hypertension Son     Social History   Tobacco Use  . Smoking status: Former Smoker    Types: Cigars    Quit date: 09/02/1977    Years since quitting: 42.3  . Smokeless tobacco: Never Used  . Tobacco comment:    Substance Use Topics  . Alcohol use: Not Currently    Comment: "stopped drinking alcohol in ~ 1980; just drank a little on the weekends when I did drink"  . Drug use: No    Home Medications Prior to Admission medications   Medication Sig Start Date End Date Taking? Authorizing Provider  amLODipine (NORVASC) 10 MG tablet Take 1 tablet (10 mg total) by mouth daily. 10/15/19  Yes [name], DO  cetirizine (ZYRTEC) 10 MG tablet TAKE 1 TABLET (10 MG TOTAL) BY MOUTH DAILY AS NEEDED (SEASONAL ALLERGIES). 11/12/19  Yes [name], DO  Cholecalciferol (VITAMIN D3 PO) Take 1 tablet by mouth daily.   Yes [provider]  Cyanocobalamin (VITAMIN B-12 PO) Take 1 tablet by mouth daily.   Yes [provider]  Omega-3 Fatty Acids (FISH OIL PO) Take 1 tablet by mouth daily.   Yes [provider]  tamsulosin (FLOMAX) 0.4 MG CAPS capsule Take 1 capsule (0.4 mg total) by mouth daily. 10/15/19  Yes [name], DO  glucose blood (ONETOUCH ULTRA) test strip Check  FSBS twice a day. Dx: E11.21, N18.32 08/16/19   Fulp, Ander Gaster, MD  atorvastatin (LIPITOR) 40 MG tablet Take 1 tablet (40 mg total) by mouth daily. 10/15/19 12/23/19  [name], DO    Allergies    Amoxicillin and Penicillins  Review of Systems   Review of Systems  Constitutional: Positive for fatigue.  HENT: Negative for congestion.   Respiratory: Positive for shortness of breath. Negative for stridor.   Gastrointestinal: Negative for abdominal pain.  Genitourinary: Negative for flank pain.  Musculoskeletal: Negative for back pain.  Skin: Negative for rash.  Neurological: Negative for seizures.  Psychiatric/Behavioral: Negative for confusion.    Physical Exam Updated Vital Signs BP (!) 159/94   Pulse 60   Temp 97.9 F (36.6 C) (Oral)   Resp 14   Ht 5\' 9"  (1.753 m)   Wt 90.3 kg   SpO2 100%   BMI 29.39 kg/m   Physical Exam Vitals and nursing note reviewed.  HENT:     Head: Normocephalic.  Eyes:     Extraocular Movements: Extraocular movements intact.  Cardiovascular:     Rate and Rhythm: Normal rate.  Pulmonary:     Breath sounds: No wheezing or rhonchi.  Abdominal:     Tenderness: There is no abdominal tenderness.  Genitourinary:    Rectum: Guaiac result negative.     Comments: Brown stool on rectal exam. Musculoskeletal:     Cervical  back: Neck supple.  Skin:    General: Skin is warm.     Capillary Refill: Capillary refill takes less than 2 seconds.  Neurological:     Mental Status: He is alert and oriented to person, place, and time.     ED Results / Procedures / Treatments   Labs (all labs ordered are listed, but only abnormal results are displayed) Labs Reviewed  COMPREHENSIVE METABOLIC PANEL - Abnormal; Notable for the following components:      Result Value   Potassium 3.3 (*)    CO2 21 (*)    Glucose, Bld 100 (*)    BUN 73 (*)    Creatinine, Ser 4.95 (*)    AST 14 (*)    GFR calc non Af Amer 11 (*)    GFR calc Af Amer 12 (*)    All other components within normal limits  CBC - Abnormal; Notable for the following components:   RBC 2.43 (*)    Hemoglobin 8.0 (*)    HCT 24.0 (*)    All other components within normal limits  PROTIME-INR  POC OCCULT BLOOD, ED  TYPE AND SCREEN  PREPARE RBC (CROSSMATCH)    EKG None  Radiology No results found.  Procedures Procedures (including critical care time)  Medications Ordered in ED Medications  0.9 %  sodium chloride infusion (has no administration in time range)    ED Course  I have reviewed the triage vital signs and the nursing notes.  Pertinent labs & imaging results that were available during my care of the patient were reviewed by me and considered in my medical decision making (see chart for details).    MDM Rules/Calculators/A&P                      Patient with acute on chronic anemia.  Hemoglobin of 8.  Appears to be somewhat close to his baseline recently but is also been transfused to this point.  No available beds I think patient probably benefit from transfusion of a single unit  here and then outpatient follow-up.  Care will be turned over to Dr.Bero. Final Clinical Impression(s) / ED Diagnoses Final diagnoses:  Anemia, unspecified type    Rx / DC Orders ED Discharge Orders    None       Davonna Belling, MD 12/24/19 [phone]

## 2019-12-25 LAB — TYPE AND SCREEN
ABO/RH(D): A POS
Antibody Screen: NEGATIVE
Unit division: 0

## 2019-12-25 LAB — BPAM RBC
Blood Product Expiration Date: 202105112359
ISSUE DATE / TIME: 202104231334
Unit Type and Rh: 6200

## 2019-12-29 ENCOUNTER — Encounter (HOSPITAL_COMMUNITY): Payer: Self-pay

## 2019-12-29 ENCOUNTER — Other Ambulatory Visit: Payer: Self-pay

## 2019-12-29 ENCOUNTER — Ambulatory Visit (HOSPITAL_COMMUNITY)
Admission: EM | Admit: 2019-12-29 | Discharge: 2019-12-29 | Disposition: A | Discharge status: Home / Self Care | Payer: Medicare Other | Attending: Family Medicine | Admitting: Family Medicine

## 2019-12-29 DIAGNOSIS — D649 Anemia, unspecified: Secondary | ICD-10-CM | POA: Diagnosis not present

## 2019-12-29 DIAGNOSIS — M79604 Pain in right leg: Secondary | ICD-10-CM | POA: Diagnosis present

## 2019-12-29 LAB — CBC WITH DIFFERENTIAL/PLATELET
Abs Immature Granulocytes: 0.02 10*3/uL (ref 0.00–0.07)
Basophils Absolute: 0 10*3/uL (ref 0.0–0.1)
Basophils Relative: 0 %
Eosinophils Absolute: 0.1 10*3/uL (ref 0.0–0.5)
Eosinophils Relative: 2 %
HCT: 25.5 % — ABNORMAL LOW (ref 39.0–52.0)
Hemoglobin: 8.5 g/dL — ABNORMAL LOW (ref 13.0–17.0)
Immature Granulocytes: 0 %
Lymphocytes Relative: 20 %
Lymphs Abs: 1 10*3/uL (ref 0.7–4.0)
MCH: 32.1 pg (ref 26.0–34.0)
MCHC: 33.3 g/dL (ref 30.0–36.0)
MCV: 96.2 fL (ref 80.0–100.0)
Monocytes Absolute: 0.6 10*3/uL (ref 0.1–1.0)
Monocytes Relative: 11 %
Neutro Abs: 3.5 10*3/uL (ref 1.7–7.7)
Neutrophils Relative %: 67 %
Platelets: 224 10*3/uL (ref 150–400)
RBC: 2.65 MIL/uL — ABNORMAL LOW (ref 4.22–5.81)
RDW: 14.6 % (ref 11.5–15.5)
WBC: 5.2 10*3/uL (ref 4.0–10.5)
nRBC: 0 % (ref 0.0–0.2)

## 2019-12-29 MED ORDER — HYDROCODONE-ACETAMINOPHEN 5-325 MG PO TABS: 1.0000 | ORAL_TABLET | Freq: Four times a day (QID) | ORAL | 0 refills | Status: DC | PRN

## 2019-12-29 NOTE — ED Triage Notes (Signed)
Pt states he has low blood and and he says he feels weak. Pt states he needs a refill of pain meds.

## 2019-12-29 NOTE — Discharge Instructions (Signed)
Follow-up with your doctor about possibly receiving good outpatient blood transfusion Your blood levels were actually improved from previous visit Refilled your pain medicine as prescribed Please see your doctor for further problems

## 2019-12-30 NOTE — ED Provider Notes (Signed)
[place]    CSN: [id number] Arrival date & time: 12/29/19  1349      History   Chief Complaint Chief Complaint  Patient presents with  . low blood/ weak    HPI [place] [name] [name] [name] [name] [name] [name] 74 y.o. male.   Patient [name] [name] [name] 74 year old male who presents today for recheck of blood count.  [name] was seen here on 4/22 and found to have hemoglobin of 8.  [name] does have history of chronic anemia.  This [name] [name] not far off from his baseline.  [name] was concerned due to increasing fatigue.  [name] then proceeded to go to the ER and they gave him 1 unit of blood.  Reporting [name] did not feel much improvement after this unit of blood.  Still having [name] lot of fatigue.  Denies any shortness of breath, dizziness, headache.  [name] also continues to have chronic right upper leg pain.  [name] was also seen for this previously and had DVT study which was negative for DVT.  [name] has been taking hydrocodone which helps with his pain.  [name] [name] [name] currently out of this medicine.  Describes the pain [name] [name] sharp and burning at times.  Denies any numbness, tingling, radiation of pain or weakness.no injury to the leg or swelling.   ROS per HPI      Past Medical History:  Diagnosis Date  . Abnormal CT of liver    [name]. nodular contour suggesting cirrhosis 05/2018.  [name] Abnormal LFTs (liver function tests)   . Anxiety   . Aortic root dilatation ([place])   . Ascending aortic aneurysm ([place])   . BPH with urinary obstruction   . Bradycardia   . 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 CAD (coronary artery disease)    [name]. 12/2015 NSTEMI/Cath (in setting of PAF):  LM nl, LAD 30p,  LCX 5m, RCA  ok, AM 100%, RPDA1 40, RPDA 2 60 ->Med Rx.  . Childhood asthma   . Chronic diastolic CHF (congestive heart failure) ([place])    [name]. 12/2015 Echo: EF 50-55%, mod AI, mod Ao root dil, mild MR, mod dil LA, mod RA.  . CKD (chronic kidney disease), stage III   . Colon cancer ([place])   . COPD  (chronic obstructive pulmonary disease) ([place])    pt denies at preop  . Diabetes mellitus type II    diet controlled  . History of syncope   . Hyperlipidemia   . Hypertensive heart disease   . Kidney lump 04/04/2010   Overview:  Renal Cell Carcinoma   . 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 Moderate aortic insufficiency    [name]. 12/2015 Echo: Mod AI.  [name] Paroxysmal atrial fibrillation ([place])    [name]. 12/2015 started on Xarelto (CHA2DS2VASc = 4-5).  . Pneumonia   . Prostate cancer ([place])   . Renal cell carcinoma ([place])   . Sleep apnea    Does not like  CPAP  . Spinal stenosis in cervical region 10/12/2016  . Venous insufficiency     Patient Active Problem List   Diagnosis Date Noted  . Ascending aortic aneurysm ([place]) 08/04/2019  . 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 CKD (chronic kidney disease), stage III 06/04/2019  . Renal cell carcinoma ([place]) 06/04/2019  . Gross hematuria 06/04/2019  . COPD exacerbation ([place]) 11/05/2018  . Palpitations 10/22/2018  . Counseling regarding advance care planning and goals of care 06/22/2018  . AKI (  acute kidney injury) ([place])   . Anemia   . Sepsis ([place]) 06/05/2018  . Acute lower UTI 06/05/2018  . Colon polyp 05/21/2018  . Aortic valve regurgitation 04/14/2018  . Medication management 04/14/2018  . Thoracic aortic aneurysm without rupture ([place]) 09/29/2017  . 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 Head trauma, subsequent encounter 10/12/2016  . 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 Spinal stenosis in cervical region 10/12/2016  . Cervical spondylosis 10/12/2016  . CAD (coronary artery disease)   . Chronic diastolic CHF (congestive heart failure) ([place])   . Hyperlipidemia   . Hypertensive heart disease   . Paroxysmal atrial fibrillation ([place]) 12/17/2015  . 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 Mild intermittent asthma 10/18/2015  . Aortic root dilatation ([place]) 10/17/2015    . Diabetes ([place]) 10/17/2015  . 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 Hand paresthesia 12/09/2014  . Elevated prostate specific antigen (PSA) 07/12/2014  . Allergic rhinitis 07/12/2014  . LBP (low back pain) 07/12/2014  . Palpitation 07/11/2014  . Patient's other noncompliance with medication regimen 08/10/2013  . Chronic venous insufficiency 07/23/2012  . Obstructive apnea 04/11/2011  . Essential (primary) hypertension 09/07/2010  . 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 Anxiety, generalized 04/04/2010  . Cardiac conduction disorder 02/27/2010  . BPH (benign prostatic hyperplasia) 09/13/2009  . Abnormal findings on examination of genitourinary organs 08/09/2009  . Hereditary and idiopathic neuropathy 07/05/2009    Past Surgical History:  Procedure Laterality Date  . BACK SURGERY    . CARDIAC CATHETERIZATION N/[name] 12/18/2015   Procedure: Left Heart Cath and Coronary Angiography;  Surgeon: Leonie Man, MD;  Location: [place] CV LAB;  Service: Cardiovascular;  Laterality: N/[name];  . DIAGNOSTIC LAPAROSCOPY     partial colectomy  . IR FLUORO GUIDE CV LINE RIGHT  06/28/2019  . IR REMOVAL TUN CV CATH W/O FL  07/26/2019  . IR US GUIDE VASC ACCESS RIGHT  06/28/2019  . LAPAROSCOPIC PARTIAL COLECTOMY Right 05/21/2018   Procedure: LAPAROSCOPIC RIGHT  COLECTOMY ERAS PATHWAY;  Surgeon: Leighton Ruff, MD;  Location: [facility];  Service: General;  Laterality: Right;  . [place]   "replaced [name] disc"       Home Medications    Prior to Admission medications   Medication Sig Start Date End Date Taking? Authorizing Provider  amLODipine (NORVASC) 10 MG tablet Take 1 tablet (10 mg total) by mouth daily. 10/15/19   [name], DO  cetirizine (ZYRTEC) 10 MG tablet TAKE 1 TABLET (10 MG TOTAL) BY MOUTH DAILY AS NEEDED (SEASONAL ALLERGIES). 11/12/19   [name], DO  Cholecalciferol (VITAMIN D3 PO) Take 1  tablet by mouth daily.    [provider]  Cyanocobalamin (VITAMIN B-12 PO) Take 1 tablet by mouth daily.    [provider]  glucose blood (ONETOUCH ULTRA) test strip Check FSBS twice [name] day. Dx: E11.21, N18.32 08/16/19   Fulp, Ander Gaster, MD  HYDROcodone-acetaminophen (NORCO/VICODIN) 5-325 MG tablet Take 1-2 tablets by mouth every 6 (six) hours as needed (gout pain). 12/29/19   Orvan July, NP  Omega-3 Fatty Acids (FISH OIL PO) Take 1 capsule by mouth daily.     [provider]  tamsulosin (FLOMAX) 0.4 MG CAPS capsule Take 1 capsule (0.4 mg total) by mouth daily. 10/15/19   [name], DO  atorvastatin (LIPITOR) 40 MG tablet  Take 1 tablet (40 mg total) by mouth daily. 10/15/19 12/23/19  [name], DO    Family History Family History  Problem Relation Age of Onset  . Emphysema Father   . Asthma Father   . Liver disease Father        tumor  . Heart disease Mother   . Hypertension Mother   . Asthma Sister   . Hypertension Son     Social History Social History   Tobacco Use  . Smoking status: Former Smoker    Types: Cigars    Quit date: 09/02/1977    Years since quitting: 42.3  . Smokeless tobacco: Never Used  . Tobacco comment:    Substance Use Topics  . Alcohol use: Not Currently    Comment: "stopped drinking alcohol in ~ 1980; just drank [name] little on the weekends when I did drink"  . Drug use: No     Allergies   Amoxicillin and Penicillins   Review of Systems Review of Systems   Physical Exam Triage Vital Signs ED Triage Vitals  Enc Vitals Group     BP 12/29/19 1401 (!) 143/72     Pulse Rate 12/29/19 1401 86     Resp 12/29/19 1401 18     Temp 12/29/19 1401 98.1 F (36.7 C)     Temp Source 12/29/19 1401 Oral     SpO2 12/29/19 1401 98 %     Weight 12/29/19 1404 190 lb (86.2 kg)     Height --      Head Circumference --      Peak Flow --      Pain Score 12/29/19 1403 5     Pain Loc --      Pain Edu? --       Excl. in [place]? --    No data found.  Updated Vital Signs BP (!) 143/72 (BP Location: Right Arm)   Pulse 86   Temp 98.1 F (36.7 C) (Oral)   Resp 18   Wt 190 lb (86.2 kg)   SpO2 98%   BMI 28.06 kg/m   Visual Acuity Right Eye Distance:   Left Eye Distance:   Bilateral Distance:    Right Eye Near:   Left Eye Near:    Bilateral Near:     Physical Exam Vitals and nursing note reviewed.  Constitutional:      Appearance: Normal appearance.  HENT:     Head: Normocephalic and atraumatic.     Nose: Nose normal.  Eyes:     Conjunctiva/sclera: Conjunctivae normal.  Pulmonary:     Effort: Pulmonary effort [name] [name] normal.  Musculoskeletal:        General: Normal range of motion.     Cervical back: Normal range of motion.       Legs:     Comments: Area where [name] sts pain [name] [name] located.  Non tender to palpation.  No swelling, bruising.   Skin:    General: Skin [name] [name] warm and dry.  Neurological:     Mental Status: [name] [name] [name] alert.  Psychiatric:        Mood and Affect: Mood normal.      UC Treatments / Results  Labs (all labs ordered are listed, but only abnormal results are displayed) Labs Reviewed  CBC WITH DIFFERENTIAL/PLATELET - Abnormal; Notable for the following components:      Result Value   RBC 2.65 (*)    Hemoglobin 8.5 (*)    HCT 25.5 (*)  All other components within normal limits    EKG   Radiology No results found.  Procedures Procedures (including critical care time)  Medications Ordered in UC Medications - No data to display  Initial Impression / Assessment and Plan / UC Course  I have reviewed the triage vital signs and the nursing notes.  Pertinent labs & imaging results that were available during my care of the patient were reviewed by me and considered in my medical decision making (see chart for details).     Anemia-hemoglobin has improved from 8-8.5 [name] [name] [name] still having increasing fatigue. Otherwise vital signs stable and [name] [name] [name] nontoxic or  ill-appearing today. Recommended speak with his doctor to schedule blood transfusion outpatient so [name] does not have to wait long hours in the ER. [name] has done this before in the past  Leg pain Refilling hydrocodone as requested for pain. Patient plans to follow-up with his doctor for further management of this leg issue. Nothing concerning on exam.  [name] has been evaluated for this previously with negative DVT study.  Follow up as needed for continued or worsening symptoms  Final Clinical Impressions(s) / UC Diagnoses   Final diagnoses:  Anemia, unspecified type  Leg pain, anterior, right     Discharge Instructions     Follow-up with your doctor about possibly receiving good outpatient blood transfusion Your blood levels were actually improved from previous visit Refilled your pain medicine as prescribed Please see your doctor for further problems    ED Prescriptions    Medication Sig Dispense Auth. Provider   HYDROcodone-acetaminophen (NORCO/VICODIN) 5-325 MG tablet Take 1-2 tablets by mouth every 6 (six) hours as needed (gout pain). 12 tablet Flem Enderle A, NP     I have reviewed the PDMP during this encounter.   Orvan July, NP 12/30/19 1113

## 2020-01-08 ENCOUNTER — Other Ambulatory Visit: Payer: Self-pay

## 2020-01-08 ENCOUNTER — Encounter (HOSPITAL_COMMUNITY): Payer: Self-pay | Admitting: Emergency Medicine

## 2020-01-08 ENCOUNTER — Emergency Department (HOSPITAL_COMMUNITY)
Admission: EM | Admit: 2020-01-08 | Discharge: 2020-01-08 | Disposition: A | Discharge status: Home / Self Care | Payer: Medicare Other | Attending: Emergency Medicine | Admitting: Emergency Medicine

## 2020-01-08 DIAGNOSIS — I130 Hypertensive heart and chronic kidney disease with heart failure and stage 1 through stage 4 chronic kidney disease, or unspecified chronic kidney disease: Secondary | ICD-10-CM | POA: Diagnosis not present

## 2020-01-08 DIAGNOSIS — I2510 Atherosclerotic heart disease of native coronary artery without angina pectoris: Secondary | ICD-10-CM | POA: Diagnosis not present

## 2020-01-08 DIAGNOSIS — I5032 Chronic diastolic (congestive) heart failure: Secondary | ICD-10-CM | POA: Diagnosis not present

## 2020-01-08 DIAGNOSIS — D631 Anemia in chronic kidney disease: Secondary | ICD-10-CM | POA: Diagnosis not present

## 2020-01-08 DIAGNOSIS — R531 Weakness: Secondary | ICD-10-CM | POA: Diagnosis present

## 2020-01-08 DIAGNOSIS — Z79899 Other long term (current) drug therapy: Secondary | ICD-10-CM | POA: Insufficient documentation

## 2020-01-08 DIAGNOSIS — D649 Anemia, unspecified: Secondary | ICD-10-CM

## 2020-01-08 DIAGNOSIS — Z87891 Personal history of nicotine dependence: Secondary | ICD-10-CM | POA: Insufficient documentation

## 2020-01-08 DIAGNOSIS — N183 Chronic kidney disease, stage 3 unspecified: Secondary | ICD-10-CM | POA: Insufficient documentation

## 2020-01-08 DIAGNOSIS — J449 Chronic obstructive pulmonary disease, unspecified: Secondary | ICD-10-CM | POA: Insufficient documentation

## 2020-01-08 LAB — URINALYSIS, ROUTINE W REFLEX MICROSCOPIC
Bacteria, UA: NONE SEEN
Bilirubin Urine: NEGATIVE
Glucose, UA: NEGATIVE mg/dL
Ketones, ur: NEGATIVE mg/dL
Leukocytes,Ua: NEGATIVE
Nitrite: NEGATIVE
Protein, ur: 100 mg/dL — AB
Specific Gravity, Urine: 1.011 (ref 1.005–1.030)
pH: 6 (ref 5.0–8.0)

## 2020-01-08 LAB — BASIC METABOLIC PANEL
Anion gap: 10 (ref 5–15)
BUN: 73 mg/dL — ABNORMAL HIGH (ref 8–23)
CO2: 24 mmol/L (ref 22–32)
Calcium: 9 mg/dL (ref 8.9–10.3)
Chloride: 101 mmol/L (ref 98–111)
Creatinine, Ser: 5.63 mg/dL — ABNORMAL HIGH (ref 0.61–1.24)
GFR calc Af Amer: 11 mL/min — ABNORMAL LOW (ref >60)
GFR calc non Af Amer: 9 mL/min — ABNORMAL LOW (ref >60)
Glucose, Bld: 152 mg/dL — ABNORMAL HIGH (ref 70–99)
Potassium: 3.1 mmol/L — ABNORMAL LOW (ref 3.5–5.1)
Sodium: 135 mmol/L (ref 135–145)

## 2020-01-08 LAB — CBC
HCT: 23.7 % — ABNORMAL LOW (ref 39.0–52.0)
Hemoglobin: 7.8 g/dL — ABNORMAL LOW (ref 13.0–17.0)
MCH: 32.2 pg (ref 26.0–34.0)
MCHC: 32.9 g/dL (ref 30.0–36.0)
MCV: 97.9 fL (ref 80.0–100.0)
Platelets: 186 10*3/uL (ref 150–400)
RBC: 2.42 MIL/uL — ABNORMAL LOW (ref 4.22–5.81)
RDW: 14.3 % (ref 11.5–15.5)
WBC: 5.2 10*3/uL (ref 4.0–10.5)
nRBC: 0 % (ref 0.0–0.2)

## 2020-01-08 LAB — PREPARE RBC (CROSSMATCH)

## 2020-01-08 MED ORDER — SODIUM CHLORIDE 0.9 % IV SOLN: 10.0000 mL/h | Freq: Once | INTRAVENOUS | Status: DC

## 2020-01-08 MED ORDER — HYDROCODONE-ACETAMINOPHEN 5-325 MG PO TABS
1.0000 | ORAL_TABLET | Freq: Once | ORAL | Status: AC
  Administered 2020-01-08: 1 via ORAL
  Filled 2020-01-08: qty 1

## 2020-01-08 NOTE — ED Notes (Signed)
Pt is not displaying any signs of a reaction to the blood transfusion at this time.

## 2020-01-08 NOTE — Discharge Instructions (Addendum)
Please be sure to schedule follow-up with your primary care physician, monitor your condition carefully and do not hesitate to return here if you develop new, or concerning changes.

## 2020-01-08 NOTE — ED Provider Notes (Signed)
[facility] DEPT Provider Note   CSN: [id number] Arrival date & time: 01/08/20  1325     History Chief Complaint  Patient presents with  . low Hgb    [name] [name] is a 74 y.o. male.  HPI    Patient with multiple medical issues including chronic kidney disease, anemia, frequent transfusions now presents with weakness, lightheadedness. He notes that since evaluation little more than 1 week ago, with transfusion of 1 unit of blood he has felt progressive, worsening lightheadedness, weakness, fatigue, minimal capacity to perform ADL. No new pain that is persistent, no syncope, no fall.  He denies other new medication changes, diet changes. He is scheduled to see hematology in 1 week, but with worsening symptoms he presents today for evaluation.  Past Medical History:  Diagnosis Date  . Abnormal CT of liver    a. nodular contour suggesting cirrhosis 05/2018.  [name] Abnormal LFTs (liver function tests)   . Anxiety   . Aortic root dilatation ([place])   . Ascending aortic aneurysm ([place])   . BPH with urinary obstruction   . Bradycardia   . 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 CAD (coronary artery disease)    a. 12/2015 NSTEMI/Cath (in setting of PAF):  LM nl, LAD 30p,  LCX 65m, RCA  ok, AM 100%, RPDA1 40, RPDA 2 60 ->Med Rx.  . Childhood asthma   . Chronic diastolic CHF (congestive heart failure) ([place])    a. 12/2015 Echo: EF 50-55%, mod AI, mod Ao root dil, mild MR, mod dil LA, mod RA.  . CKD (chronic kidney disease), stage III   . Colon cancer ([place])   . COPD (chronic obstructive pulmonary disease) ([place])    pt denies at preop  . Diabetes mellitus type II    diet controlled  . History of syncope   . Hyperlipidemia   . Hypertensive heart disease   . Kidney lump 04/04/2010   Overview:  Renal Cell Carcinoma   . 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 Moderate aortic insufficiency    a.  12/2015 Echo: Mod AI.  [name] Paroxysmal atrial fibrillation ([place])    a. 12/2015 started on Xarelto (CHA2DS2VASc = 4-5).  . Pneumonia   . Prostate cancer ([place])   . Renal cell carcinoma ([place])   . Sleep apnea    Does not like  CPAP  . Spinal stenosis in cervical region 10/12/2016  . Venous insufficiency     Patient Active Problem List   Diagnosis Date Noted  . Ascending aortic aneurysm ([place]) 08/04/2019  . Myeloma kidney ([place])   . CKD (chronic kidney disease), stage III 06/04/2019  . Renal cell carcinoma ([place]) 06/04/2019  . Gross hematuria 06/04/2019  . COPD exacerbation ([place]) 11/05/2018  . Palpitations 10/22/2018  . Counseling regarding advance care planning and goals of care 06/22/2018  . AKI (acute kidney injury) ([place])   . Anemia   . Sepsis ([place]) 06/05/2018  . Acute lower UTI 06/05/2018  . Colon polyp 05/21/2018  . Aortic valve regurgitation 04/14/2018  . Medication management 04/14/2018  . Thoracic aortic aneurysm without rupture ([place]) 09/29/2017  . 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 Head trauma, subsequent encounter 10/12/2016  . 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 Spinal stenosis in cervical region 10/12/2016  . Cervical spondylosis 10/12/2016  .  CAD (coronary artery disease)   . Chronic diastolic CHF (congestive heart failure) ([place])   . Hyperlipidemia   . Hypertensive heart disease   . Paroxysmal atrial fibrillation ([place]) 12/17/2015  . 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 Mild intermittent asthma 10/18/2015  . Aortic root dilatation ([place]) 10/17/2015  . Diabetes ([place]) 10/17/2015  . 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 Hand paresthesia 12/09/2014  . Elevated prostate specific antigen (PSA) 07/12/2014  . Allergic rhinitis 07/12/2014  . LBP (low back pain) 07/12/2014  . Palpitation 07/11/2014  . Patient's other noncompliance with medication  regimen 08/10/2013  . Chronic venous insufficiency 07/23/2012  . Obstructive apnea 04/11/2011  . Essential (primary) hypertension 09/07/2010  . 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 Anxiety, generalized 04/04/2010  . Cardiac conduction disorder 02/27/2010  . BPH (benign prostatic hyperplasia) 09/13/2009  . Abnormal findings on examination of genitourinary organs 08/09/2009  . Hereditary and idiopathic neuropathy 07/05/2009    Past Surgical History:  Procedure Laterality Date  . BACK SURGERY    . CARDIAC CATHETERIZATION N/A 12/18/2015   Procedure: Left Heart Cath and Coronary Angiography;  Surgeon: Leonie Man, MD;  Location: [facility];  Service: Cardiovascular;  Laterality: N/A;  . DIAGNOSTIC LAPAROSCOPY     partial colectomy  . IR FLUORO GUIDE CV LINE RIGHT  06/28/2019  . IR REMOVAL TUN CV CATH W/O FL  07/26/2019  . IR US GUIDE VASC ACCESS RIGHT  06/28/2019  . LAPAROSCOPIC PARTIAL COLECTOMY Right 05/21/2018   Procedure: LAPAROSCOPIC RIGHT  COLECTOMY ERAS PATHWAY;  Surgeon: Leighton Ruff, MD;  Location: [facility];  Service: General;  Laterality: Right;  . [place]   "replaced a disc"       Family History  Problem Relation Age of Onset  . Emphysema Father   . Asthma Father   . Liver disease Father        tumor  . Heart disease Mother   . Hypertension Mother   . Asthma Sister   . Hypertension Son     Social History   Tobacco Use  . Smoking status: Former Smoker    Types: Cigars    Quit date: 09/02/1977    Years since quitting: 42.3  . Smokeless tobacco: Never Used  . Tobacco comment:    Substance Use Topics  . Alcohol use: Not Currently    Comment: "stopped drinking alcohol in ~ 1980; just drank a little on the weekends when I did drink"  . Drug use: No    Home Medications Prior to Admission medications   Medication Sig Start Date End Date Taking? Authorizing Provider  amLODipine (NORVASC) 10 MG tablet Take 1 tablet  (10 mg total) by mouth daily. 10/15/19  Yes [name], DO  atorvastatin (LIPITOR) 40 MG tablet Take 40 mg by mouth daily.   Yes [provider]  cetirizine (ZYRTEC) 10 MG tablet TAKE 1 TABLET (10 MG TOTAL) BY MOUTH DAILY AS NEEDED (SEASONAL ALLERGIES). 11/12/19  Yes [name], DO  Cholecalciferol (VITAMIN D3 PO) Take 1 tablet by mouth daily.   Yes [provider]  Cyanocobalamin (VITAMIN B-12 PO) Take 1 tablet by mouth daily.   Yes [provider]  glucose blood (ONETOUCH ULTRA) test strip Check FSBS twice a day. Dx: E11.21, N18.32 08/16/19  Yes Fulp, Cammie, MD  HYDROcodone-acetaminophen (NORCO/VICODIN) 5-325 MG tablet Take 1-2 tablets by mouth every 6 (six) hours as needed (gout pain). 12/29/19  Yes Bast, Traci A, NP  Omega-3 Fatty Acids (FISH OIL PO) Take 1 capsule by mouth daily.    Yes [provider]  tamsulosin (FLOMAX) 0.4 MG CAPS capsule Take 1 capsule (0.4 mg total) by mouth daily. 10/15/19  Yes [name], DO    Allergies    Amoxicillin and Penicillins  Review of Systems   Review of Systems  Constitutional:       Per HPI, otherwise negative  HENT:       Per HPI, otherwise negative  Respiratory:       Per HPI, otherwise negative  Cardiovascular:       Per HPI, otherwise negative  Gastrointestinal: Negative for vomiting.  Endocrine:       Negative aside from HPI  Genitourinary:       Neg aside from HPI   Musculoskeletal:       Per HPI, otherwise negative  Skin: Negative.   Neurological: Positive for weakness and light-headedness. Negative for syncope.    Physical Exam Updated Vital Signs BP (!) 171/86   Pulse 75   Temp 97.8 F (36.6 C)   Resp (!) 23   SpO2 99%   Physical Exam Vitals and nursing note reviewed.  Constitutional:      General: He is not in acute distress.    Appearance: He is well-developed.  HENT:     Head: Normocephalic and atraumatic.  Eyes:      Conjunctiva/sclera: Conjunctivae normal.  Cardiovascular:     Rate and Rhythm: Normal rate and regular rhythm.  Pulmonary:     Effort: Pulmonary effort is normal. No respiratory distress.     Breath sounds: No stridor.  Abdominal:     General: There is no distension.  Skin:    General: Skin is warm and dry.  Neurological:     Mental Status: He is alert and oriented to person, place, and time.     ED Results / Procedures / Treatments   Labs (all labs ordered are listed, but only abnormal results are displayed) Labs Reviewed  BASIC METABOLIC PANEL - Abnormal; Notable for the following components:      Result Value   Potassium 3.1 (*)    Glucose, Bld 152 (*)    BUN 73 (*)    Creatinine, Ser 5.63 (*)    GFR calc non Af Amer 9 (*)    GFR calc Af Amer 11 (*)    All other components within normal limits  CBC - Abnormal; Notable for the following components:   RBC 2.42 (*)    Hemoglobin 7.8 (*)    HCT 23.7 (*)    All other components within normal limits  URINALYSIS, ROUTINE W REFLEX MICROSCOPIC - Abnormal; Notable for the following components:   Hgb urine dipstick SMALL (*)    Protein, ur 100 (*)    All other components within normal limits  TYPE AND SCREEN  PREPARE RBC (CROSSMATCH)     Procedures Procedures (including critical care time)  CRITICAL CARE Performed by: Carmin Muskrat Total critical care time: 35 minutes Critical care time was exclusive of separately billable procedures and treating other patients. Critical care was necessary to treat or prevent imminent or life-threatening deterioration. Critical care was time spent personally by me on the following activities: development of treatment plan with patient and/or surrogate as well as nursing, discussions with consultants, evaluation of patient's response to treatment, examination of patient, obtaining history from patient or surrogate, ordering and performing treatments and interventions,  ordering and review of  laboratory studies, ordering and review of radiographic studies, pulse oximetry and re-evaluation of patient's condition.   Medications Ordered in ED Medications  0.9 %  sodium chloride infusion (has no administration in time range)  HYDROcodone-acetaminophen (NORCO/VICODIN) 5-325 MG per tablet 1 tablet (1 tablet Oral Given 01/08/20 1841)    ED Course  I have reviewed the triage vital signs and the nursing notes.  Pertinent labs & imaging results that were available during my care of the patient were reviewed by me and considered in my medical decision making (see chart for details).  3:54 PM Patient found to have critically abnormal hemoglobin value, below his typical transfusion threshold.  Patient will receive 1 unit packed red blood cells.  This adult male with history of multiple medical issues including chronic kidney disease, anemia now presents with critically abnormal hemoglobin value, signs and symptoms consistent with symptomatic anemia.  Patient's other labs are generally reassuring, but given these abnormalities will required transfusion, continuous monitoring.  9:06 PM Patient in no distress, awake, alert.  We reviewed the patient's history, including today's transfusion which has been well-tolerated, without evidence for allergic reaction, transfusion consequences. We reviewed the importance of following up with his primary care physician in the coming days for repeat evaluation and to plan for future transfusions should they be needed. Now, without substantial changes, with tolerance of his transfusion, the patient is appropriate for discharge.  This elderly male with chronic kidney disease multiple other medical problems presents with weakness.  Patient is found to have anemia, with associated symptoms, and with is noted prior baseline limit required transfusion. No other evidence for acute new findings, including concurrent infection, and following appropriate transfusion as  above he was discharged in stable condition.  MDM Number of Diagnoses or Management Options Symptomatic anemia: new, needed workup   Amount and/or Complexity of Data Reviewed Clinical lab tests: reviewed Tests in the medicine section of CPT: reviewed Decide to obtain previous medical records or to obtain history from someone other than the patient: yes Review and summarize past medical records: yes  Risk of Complications, Morbidity, and/or Mortality Presenting problems: high Diagnostic procedures: high Management options: high    Final Clinical Impression(s) / ED Diagnoses Final diagnoses:  Symptomatic anemia     Carmin Muskrat, MD 01/08/20 2111

## 2020-01-08 NOTE — ED Notes (Signed)
Pt given education related to blood transfusion, and gave verbal consent to transfusion. Signature to be gathered electronically.

## 2020-01-08 NOTE — ED Triage Notes (Signed)
Per pt, states he is having LE weakness-states he was at [facility] on 4/28 and they said he needed 2 units of blood but he only received 1-he does not know why they only gave him 1 unit-states he needs Hgb checked and maybe some more blood

## 2020-01-09 LAB — BPAM RBC
Blood Product Expiration Date: 202105292359
ISSUE DATE / TIME: 202105081733
Unit Type and Rh: 6200

## 2020-01-09 LAB — TYPE AND SCREEN
ABO/RH(D): A POS
Antibody Screen: NEGATIVE
Unit division: 0

## 2020-01-12 ENCOUNTER — Encounter: Payer: Self-pay | Admitting: Internal Medicine

## 2020-01-12 ENCOUNTER — Ambulatory Visit (INDEPENDENT_AMBULATORY_CARE_PROVIDER_SITE_OTHER): Payer: Medicare Other | Admitting: Internal Medicine

## 2020-01-12 ENCOUNTER — Other Ambulatory Visit: Payer: Self-pay

## 2020-01-12 VITALS — BP 126/78 | HR 71 | Temp 97.5°F | Resp 17 | Ht 68.0 in | Wt 199.0 lb

## 2020-01-12 DIAGNOSIS — N2889 Other specified disorders of kidney and ureter: Secondary | ICD-10-CM

## 2020-01-12 DIAGNOSIS — K921 Melena: Secondary | ICD-10-CM | POA: Diagnosis not present

## 2020-01-12 DIAGNOSIS — C9000 Multiple myeloma not having achieved remission: Secondary | ICD-10-CM | POA: Diagnosis not present

## 2020-01-12 DIAGNOSIS — K439 Ventral hernia without obstruction or gangrene: Secondary | ICD-10-CM

## 2020-01-12 DIAGNOSIS — D649 Anemia, unspecified: Secondary | ICD-10-CM | POA: Diagnosis not present

## 2020-01-12 DIAGNOSIS — I10 Essential (primary) hypertension: Secondary | ICD-10-CM

## 2020-01-12 MED ORDER — AMLODIPINE BESYLATE 10 MG PO TABS: 10.0000 mg | ORAL_TABLET | Freq: Every day | ORAL | 1 refills | Status: DC

## 2020-01-12 NOTE — Patient Instructions (Addendum)
You have been scheduled at [facility] on Jan 26, 2020 at 10:40 AM. Their information is below.  [facility] [address]  [place], [place] [address]   Ph. [phone]

## 2020-01-12 NOTE — Progress Notes (Signed)
Subjective:    [name] [name] - 74 y.o. male MRN [id number]  Date of birth: [birth year]  HPI  [name] [name] is here for follow up ER visit on 5/8 for symptomatic anemia. HgB was 7.8 and he received 1u PRBC. Of note, he also received a transfusion in the ED on 4/23 for same presentation. Patient reports today he is feeling better and would like to have his blood counts checked.  Patient has a history of multiple myeloma. I asked him about when the last time he saw his oncologist was and he admitted "it had been a while". Stated that Jesus would take care of him.  I discussed with him that he has been referred to nephrology for CKD and anemia. He is adamant about not "being on a machine for kidneys" as his wife was on HD for 16 years prior to passing.  He is very fixated on his abdominal hernia. I previously referred him to [place] surgery who felt risk for elective procedure was too great.  He does want to go back to see his urologist. He reports he thinks his prostate might be bleeding as he sees blood in his stool. Discussed that I would recommend a referral to GI. He refused saying he would see his urologist first and then "go from there".  I spoke to his daughter on the phone who told me there are only certain things I will be able to get her father to do and that he is very stubborn. His strong belief in God outweighs his faith in modern medicine. She asked if I would simply repeat his CBC as well as place another referral to Gen Surg to a hernia specialist she had found in [place].    Health Maintenance:  Health Maintenance Due  Topic Date Due  . FOOT EXAM  Never done  . OPHTHALMOLOGY EXAM  Never done  . COVID-19 Vaccine (1) Never done  . COLONOSCOPY  Never done  . TETANUS/TDAP  04/21/2019  . URINE MICROALBUMIN  10/07/2019  . HEMOGLOBIN A1C  12/25/2019    -  reports that he quit smoking about 42 years ago. His smoking use included cigars. He has never used smokeless tobacco. -  Review of Systems: Per HPI. - Past Medical History: Patient Active Problem List   Diagnosis Date Noted  . Ascending aortic aneurysm ([place]) 08/04/2019  . 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 CKD (chronic kidney disease), stage III 06/04/2019  . Renal cell carcinoma ([place]) 06/04/2019  . Gross hematuria 06/04/2019  . COPD exacerbation ([place]) 11/05/2018  . Palpitations 10/22/2018  . Counseling regarding advance care planning and goals of care 06/22/2018  . AKI (acute kidney injury) ([place])   . Anemia   . Sepsis ([place]) 06/05/2018  . Acute lower UTI 06/05/2018  . Colon polyp 05/21/2018  . Aortic valve regurgitation 04/14/2018  . Medication management 04/14/2018  . Thoracic aortic aneurysm without rupture ([place]) 09/29/2017  . 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 Head trauma, subsequent encounter 10/12/2016  . 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 Spinal stenosis in cervical region 10/12/2016  . Cervical spondylosis 10/12/2016  . CAD (coronary artery disease)   . Chronic diastolic CHF (congestive heart failure) ([place])   . Hyperlipidemia   . Hypertensive heart disease   . Paroxysmal atrial fibrillation ([place]) 12/17/2015  . 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 Mild intermittent asthma 10/18/2015  .  Aortic root dilatation ([place]) 10/17/2015  . Diabetes ([place]) 10/17/2015  . 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 Hand paresthesia 12/09/2014  . Elevated prostate specific antigen (PSA) 07/12/2014  . Allergic rhinitis 07/12/2014  . LBP (low back pain) 07/12/2014  . Palpitation 07/11/2014  . Patient's other noncompliance with medication regimen 08/10/2013  . Chronic venous insufficiency 07/23/2012  . Obstructive apnea 04/11/2011  . Essential (primary) hypertension 09/07/2010  . 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 Anxiety, generalized 04/04/2010  . Cardiac  conduction disorder 02/27/2010  . BPH (benign prostatic hyperplasia) 09/13/2009  . Abnormal findings on examination of genitourinary organs 08/09/2009  . Hereditary and idiopathic neuropathy 07/05/2009   - Medications: reviewed and updated   Objective:   Physical Exam BP 126/78   Pulse 71   Temp (!) 97.5 F (36.4 C) (Temporal)   Resp 17   Ht 5' 8"  (1.727 m)   Wt 199 lb (90.3 kg)   SpO2 97%   BMI 30.26 kg/m  Physical Exam  Constitutional: He is oriented to person, place, and time and well-developed, well-nourished, and in no distress. No distress.  Cardiovascular: Normal rate.  Pulmonary/Chest: Effort normal. No respiratory distress.  Musculoskeletal:        General: Normal range of motion.  Neurological: He is alert and oriented to person, place, and time.  Skin: Skin is warm and dry. He is not diaphoretic.  Psychiatric: Affect and judgment normal.           Assessment & Plan:   1. Symptomatic anemia 2. Left renal mass 3. Bloody stools 4. Multiple myeloma not having achieved remission [facility]) This is becoming a recurrent problem for which patient has had outpatient transfusions and ER visits with transfusions. Patient has multiple co-morbidities that are likely contributing and unfortunately, he is unwilling to be compliant with much of his medical treatment. From extensive review of his chart it appears he stopped treatment for multiple myeloma in Nov 2020. From oncology note, appears they counseled him about the likelihood of his kidney function and anemia worsening as it did the previous time he abandoned treatment and he was adamant that faith was all he needed to be cured. Additionally, his course is complicated by left renal mass and from review of past urology consult note, he has experienced hematuria. At visit today, it sounded that he was having more bleeding per rectum but patient is a poor historian. He does refuse GI follow up. I do not see a colonoscopy in the chart  but colon polyps are listed on his problem list. His CKD is also likely worsening his anemia; however, he is resistant to nephrology follow up as well. Currently, option seems to be recurrent transfusions for symptomatic anemia as it arises.  - CBC  5. Essential hypertension BP is stable today and at goal.  - amLODipine (NORVASC) 10 MG tablet; Take 1 tablet (10 mg total) by mouth daily.  Dispense: 90 tablet; Refill: 1  6. Hernia of abdominal wall Discussed with patient that given his hernia does not exhibit any symptoms of incarceration or strangulation, this would be considered more of an elective surgery. Due to his significant medical history and co-morbidities, I suspect patient would be poor candidate for surgery and that a surgeon would be unlikely to operate. However, patient is insistent that he receive a second opinion. Referral placed.  - Ambulatory referral to General Surgery   Greater than >68mnutes was spent face-to-face with patient and speaking  with his daughter via phone. >25 minutes was spent reviewing the chart for past consult notes, lab values, history, etc and on documentation for today's visit.     Phill Myron, D.O. 01/12/2020, 1:45 PM Primary Care at [facility]

## 2020-01-13 LAB — CBC
Hematocrit: 27.2 % — ABNORMAL LOW (ref 37.5–51.0)
Hemoglobin: 9 g/dL — ABNORMAL LOW (ref 13.0–17.7)
MCH: 31.1 pg (ref 26.6–33.0)
MCHC: 33.1 g/dL (ref 31.5–35.7)
MCV: 94 fL (ref 79–97)
Platelets: 220 10*3/uL (ref 150–450)
RBC: 2.89 x10E6/uL — ABNORMAL LOW (ref 4.14–5.80)
RDW: 15.4 % (ref 11.6–15.4)
WBC: 5.2 10*3/uL (ref 3.4–10.8)

## 2020-01-14 NOTE — Progress Notes (Signed)
Patient notified of results & recommendations. Expressed understanding. Gave info for Nephrology appointment again.

## 2020-01-17 ENCOUNTER — Telehealth: Payer: Self-pay | Admitting: Internal Medicine

## 2020-01-17 ENCOUNTER — Other Ambulatory Visit: Payer: Self-pay | Admitting: Internal Medicine

## 2020-01-17 DIAGNOSIS — C9000 Multiple myeloma not having achieved remission: Secondary | ICD-10-CM

## 2020-01-17 NOTE — Telephone Encounter (Signed)
That's wonderful news! I have placed the referral to oncology so that he can re-establish.   Phill Myron, D.O. Primary Care at [facility]  01/17/2020, 3:30 PM

## 2020-01-17 NOTE — Telephone Encounter (Signed)
Pt came in asking [place] to send him back to the cancer doctor he was ready for treatment now

## 2020-01-22 ENCOUNTER — Other Ambulatory Visit: Payer: Self-pay

## 2020-01-22 ENCOUNTER — Inpatient Hospital Stay (HOSPITAL_COMMUNITY): Payer: Medicare Other

## 2020-01-22 ENCOUNTER — Inpatient Hospital Stay (HOSPITAL_COMMUNITY)
Admission: AD | Admit: 2020-01-22 | Discharge: 2020-01-27 | DRG: 674 | Disposition: A | Discharge status: Home Health | Payer: Medicare Other | Source: Other Acute Inpatient Hospital | Attending: Internal Medicine | Admitting: Internal Medicine

## 2020-01-22 ENCOUNTER — Encounter (HOSPITAL_COMMUNITY): Payer: Self-pay | Admitting: Internal Medicine

## 2020-01-22 DIAGNOSIS — I5032 Chronic diastolic (congestive) heart failure: Secondary | ICD-10-CM | POA: Diagnosis present

## 2020-01-22 DIAGNOSIS — N179 Acute kidney failure, unspecified: Secondary | ICD-10-CM | POA: Diagnosis present

## 2020-01-22 DIAGNOSIS — Z880 Allergy status to penicillin: Secondary | ICD-10-CM

## 2020-01-22 DIAGNOSIS — I2510 Atherosclerotic heart disease of native coronary artery without angina pectoris: Secondary | ICD-10-CM | POA: Diagnosis present

## 2020-01-22 DIAGNOSIS — Z992 Dependence on renal dialysis: Secondary | ICD-10-CM | POA: Diagnosis not present

## 2020-01-22 DIAGNOSIS — Z8546 Personal history of malignant neoplasm of prostate: Secondary | ICD-10-CM

## 2020-01-22 DIAGNOSIS — R0602 Shortness of breath: Secondary | ICD-10-CM

## 2020-01-22 DIAGNOSIS — I712 Thoracic aortic aneurysm, without rupture: Secondary | ICD-10-CM | POA: Diagnosis present

## 2020-01-22 DIAGNOSIS — J449 Chronic obstructive pulmonary disease, unspecified: Secondary | ICD-10-CM | POA: Diagnosis present

## 2020-01-22 DIAGNOSIS — E1122 Type 2 diabetes mellitus with diabetic chronic kidney disease: Secondary | ICD-10-CM | POA: Diagnosis present

## 2020-01-22 DIAGNOSIS — Z9049 Acquired absence of other specified parts of digestive tract: Secondary | ICD-10-CM | POA: Diagnosis not present

## 2020-01-22 DIAGNOSIS — D630 Anemia in neoplastic disease: Secondary | ICD-10-CM | POA: Diagnosis present

## 2020-01-22 DIAGNOSIS — Z79899 Other long term (current) drug therapy: Secondary | ICD-10-CM

## 2020-01-22 DIAGNOSIS — Z8249 Family history of ischemic heart disease and other diseases of the circulatory system: Secondary | ICD-10-CM | POA: Diagnosis not present

## 2020-01-22 DIAGNOSIS — C9000 Multiple myeloma not having achieved remission: Secondary | ICD-10-CM | POA: Diagnosis present

## 2020-01-22 DIAGNOSIS — Z9119 Patient's noncompliance with other medical treatment and regimen: Secondary | ICD-10-CM | POA: Diagnosis not present

## 2020-01-22 DIAGNOSIS — E876 Hypokalemia: Secondary | ICD-10-CM | POA: Diagnosis present

## 2020-01-22 DIAGNOSIS — Z87891 Personal history of nicotine dependence: Secondary | ICD-10-CM

## 2020-01-22 DIAGNOSIS — C649 Malignant neoplasm of unspecified kidney, except renal pelvis: Secondary | ICD-10-CM | POA: Diagnosis present

## 2020-01-22 DIAGNOSIS — D631 Anemia in chronic kidney disease: Secondary | ICD-10-CM | POA: Diagnosis present

## 2020-01-22 DIAGNOSIS — D649 Anemia, unspecified: Secondary | ICD-10-CM | POA: Diagnosis present

## 2020-01-22 DIAGNOSIS — Z7189 Other specified counseling: Secondary | ICD-10-CM

## 2020-01-22 DIAGNOSIS — Z85038 Personal history of other malignant neoplasm of large intestine: Secondary | ICD-10-CM

## 2020-01-22 DIAGNOSIS — Z515 Encounter for palliative care: Secondary | ICD-10-CM

## 2020-01-22 DIAGNOSIS — N186 End stage renal disease: Secondary | ICD-10-CM

## 2020-01-22 DIAGNOSIS — I7121 Aneurysm of the ascending aorta, without rupture: Secondary | ICD-10-CM | POA: Diagnosis present

## 2020-01-22 DIAGNOSIS — Z20822 Contact with and (suspected) exposure to covid-19: Secondary | ICD-10-CM | POA: Diagnosis present

## 2020-01-22 DIAGNOSIS — I132 Hypertensive heart and chronic kidney disease with heart failure and with stage 5 chronic kidney disease, or end stage renal disease: Secondary | ICD-10-CM | POA: Diagnosis present

## 2020-01-22 DIAGNOSIS — I252 Old myocardial infarction: Secondary | ICD-10-CM | POA: Diagnosis not present

## 2020-01-22 DIAGNOSIS — Z825 Family history of asthma and other chronic lower respiratory diseases: Secondary | ICD-10-CM | POA: Diagnosis not present

## 2020-01-22 DIAGNOSIS — I480 Paroxysmal atrial fibrillation: Secondary | ICD-10-CM | POA: Diagnosis present

## 2020-01-22 DIAGNOSIS — I351 Nonrheumatic aortic (valve) insufficiency: Secondary | ICD-10-CM | POA: Diagnosis present

## 2020-01-22 DIAGNOSIS — E785 Hyperlipidemia, unspecified: Secondary | ICD-10-CM | POA: Diagnosis present

## 2020-01-22 DIAGNOSIS — I10 Essential (primary) hypertension: Secondary | ICD-10-CM | POA: Diagnosis present

## 2020-01-22 LAB — COMPREHENSIVE METABOLIC PANEL
ALT: 14 U/L (ref 0–44)
AST: 16 U/L (ref 15–41)
Albumin: 3.4 g/dL — ABNORMAL LOW (ref 3.5–5.0)
Alkaline Phosphatase: 74 U/L (ref 38–126)
Anion gap: 14 (ref 5–15)
BUN: 90 mg/dL — ABNORMAL HIGH (ref 8–23)
CO2: 22 mmol/L (ref 22–32)
Calcium: 9.2 mg/dL (ref 8.9–10.3)
Chloride: 103 mmol/L (ref 98–111)
Creatinine, Ser: 6.51 mg/dL — ABNORMAL HIGH (ref 0.61–1.24)
GFR calc Af Amer: 9 mL/min — ABNORMAL LOW (ref >60)
GFR calc non Af Amer: 8 mL/min — ABNORMAL LOW (ref >60)
Glucose, Bld: 86 mg/dL (ref 70–99)
Potassium: 3.3 mmol/L — ABNORMAL LOW (ref 3.5–5.1)
Sodium: 139 mmol/L (ref 135–145)
Total Bilirubin: 1.2 mg/dL (ref 0.3–1.2)
Total Protein: 6.7 g/dL (ref 6.5–8.1)

## 2020-01-22 LAB — URINALYSIS, ROUTINE W REFLEX MICROSCOPIC
Bilirubin Urine: NEGATIVE
Glucose, UA: NEGATIVE mg/dL
Ketones, ur: NEGATIVE mg/dL
Leukocytes,Ua: NEGATIVE
Nitrite: NEGATIVE
Protein, ur: 30 mg/dL — AB
Specific Gravity, Urine: 1.01 (ref 1.005–1.030)
pH: 6 (ref 5.0–8.0)

## 2020-01-22 LAB — CBC WITH DIFFERENTIAL/PLATELET
Abs Immature Granulocytes: 0.02 K/uL (ref 0.00–0.07)
Basophils Absolute: 0 K/uL (ref 0.0–0.1)
Basophils Relative: 0 %
Eosinophils Absolute: 0.1 K/uL (ref 0.0–0.5)
Eosinophils Relative: 2 %
HCT: 24.9 % — ABNORMAL LOW (ref 39.0–52.0)
Hemoglobin: 8.2 g/dL — ABNORMAL LOW (ref 13.0–17.0)
Immature Granulocytes: 0 %
Lymphocytes Relative: 21 %
Lymphs Abs: 1.1 K/uL (ref 0.7–4.0)
MCH: 31.2 pg (ref 26.0–34.0)
MCHC: 32.9 g/dL (ref 30.0–36.0)
MCV: 94.7 fL (ref 80.0–100.0)
Monocytes Absolute: 0.5 K/uL (ref 0.1–1.0)
Monocytes Relative: 10 %
Neutro Abs: 3.4 K/uL (ref 1.7–7.7)
Neutrophils Relative %: 67 %
Platelets: 213 K/uL (ref 150–400)
RBC: 2.63 MIL/uL — ABNORMAL LOW (ref 4.22–5.81)
RDW: 14.6 % (ref 11.5–15.5)
WBC: 5.1 K/uL (ref 4.0–10.5)
nRBC: 0 % (ref 0.0–0.2)

## 2020-01-22 LAB — TYPE AND SCREEN
ABO/RH(D): A POS
Antibody Screen: NEGATIVE

## 2020-01-22 LAB — TSH: TSH: 3.093 u[IU]/mL (ref 0.350–4.500)

## 2020-01-22 LAB — SARS CORONAVIRUS 2 BY RT PCR (HOSPITAL ORDER, PERFORMED IN ~~LOC~~ HOSPITAL LAB): SARS Coronavirus 2: NEGATIVE

## 2020-01-22 LAB — TROPONIN I (HIGH SENSITIVITY): Troponin I (High Sensitivity): 22 ng/L — ABNORMAL HIGH (ref <18)

## 2020-01-22 LAB — CK: Total CK: 382 U/L (ref 49–397)

## 2020-01-22 MED ORDER — ONDANSETRON HCL 4 MG PO TABS: 4.0000 mg | ORAL_TABLET | Freq: Four times a day (QID) | ORAL | Status: DC | PRN

## 2020-01-22 MED ORDER — OXYCODONE HCL 5 MG PO TABS
5.0000 mg | ORAL_TABLET | Freq: Four times a day (QID) | ORAL | Status: DC | PRN
  Administered 2020-01-22 – 2020-01-27 (×4): 5 mg via ORAL
  Filled 2020-01-22 (×3): qty 1

## 2020-01-22 MED ORDER — DOCUSATE SODIUM 100 MG PO CAPS
100.0000 mg | ORAL_CAPSULE | Freq: Every day | ORAL | Status: DC
  Administered 2020-01-25: 100 mg via ORAL
  Filled 2020-01-22 (×3): qty 1

## 2020-01-22 MED ORDER — HEPARIN SODIUM (PORCINE) 5000 UNIT/ML IJ SOLN
5000.0000 [IU] | Freq: Three times a day (TID) | INTRAMUSCULAR | Status: DC
  Administered 2020-01-22 – 2020-01-23 (×3): 5000 [IU] via SUBCUTANEOUS
  Filled 2020-01-22 (×3): qty 1

## 2020-01-22 MED ORDER — DARBEPOETIN ALFA 60 MCG/0.3ML IJ SOSY
60.0000 ug | PREFILLED_SYRINGE | INTRAMUSCULAR | Status: DC
  Administered 2020-01-23: 60 ug via SUBCUTANEOUS
  Filled 2020-01-22: qty 0.3

## 2020-01-22 MED ORDER — AMLODIPINE BESYLATE 10 MG PO TABS
10.0000 mg | ORAL_TABLET | Freq: Every day | ORAL | Status: DC
  Administered 2020-01-22 – 2020-01-25 (×4): 10 mg via ORAL
  Filled 2020-01-22 (×4): qty 1

## 2020-01-22 MED ORDER — TAMSULOSIN HCL 0.4 MG PO CAPS
0.4000 mg | ORAL_CAPSULE | Freq: Every day | ORAL | Status: DC
  Administered 2020-01-22 – 2020-01-25 (×3): 0.4 mg via ORAL
  Filled 2020-01-22 (×4): qty 1

## 2020-01-22 MED ORDER — RENA-VITE PO TABS
1.0000 | ORAL_TABLET | Freq: Every day | ORAL | Status: DC
  Administered 2020-01-22 – 2020-01-26 (×5): 1 via ORAL
  Filled 2020-01-22 (×5): qty 1

## 2020-01-22 MED ORDER — ENSURE ENLIVE PO LIQD
237.0000 mL | Freq: Two times a day (BID) | ORAL | Status: DC
  Administered 2020-01-22 – 2020-01-26 (×6): 237 mL via ORAL

## 2020-01-22 MED ORDER — ATORVASTATIN CALCIUM 40 MG PO TABS
40.0000 mg | ORAL_TABLET | Freq: Every day | ORAL | Status: DC
  Administered 2020-01-22 – 2020-01-25 (×3): 40 mg via ORAL
  Filled 2020-01-22 (×4): qty 1

## 2020-01-22 MED ORDER — ONDANSETRON HCL 4 MG/2ML IJ SOLN: 4.0000 mg | Freq: Four times a day (QID) | INTRAMUSCULAR | Status: DC | PRN

## 2020-01-22 NOTE — Progress Notes (Signed)
Pt arrived to the unit; VS WDL; no pain. CHG bath is given; call bell within reach. MD at the bedside,

## 2020-01-22 NOTE — Progress Notes (Signed)
PROGRESS NOTE    [name] [name]  MRN:[id number] DOB: 03/08/1946 DOA: 01/22/2020 PCP: Avbuere, Edwin, MD    Brief Narrative:  74 y.o. male with history of multiple myeloma renal mass diastolic CHF hypertension anemia had been originally planned to follow-up with oncologist on June 1 week for potentially starting chemo for his multiple myeloma was found to be increasing weakness and had presented to the ER at [facility] yesterday.  Over the patient's creatinine is found to be around 6.7 increased from 5.62 weeks ago hemoglobin was stable around 8.4.  Potassium was 3.4.  Patient on exam did not look like patient being any fluid overload.  But due to worsening renal function patient also in addition complaining of poor appetite and uremic symptoms patient was transferred to [place] at the request of patient and patient's family since patient was already seen by oncologist aware.  On my exam patient is not in distress.  Denies any chest pain or shortness of breath.  In the month of October 2020 patient was admitted for acute renal failure in the setting of multiple myeloma had undergone 3 plasmapheresis treatment at the time.  Patient states since then patient had not followed up with oncologist at [place] and had proceeded to follow-up with [place] oncologist again with nephrology follow-up.    Of note, pt did not desire further treatment for myeloma as of 11/20, however, now agreeable to full treatment, including plasmapheresis if indicated  Assessment & Plan:   Principal Problem:   ARF (acute renal failure) (HCC) Active Problems:   Essential (primary) hypertension   Paroxysmal atrial fibrillation (HCC)   Light chain myeloma (HCC)   CAD (coronary artery disease)   Chronic diastolic CHF (congestive heart failure) (HCC)   Anemia   Ascending aortic aneurysm (HCC)  1. Acute on chronic kidney disease with history of multiple myeloma previously requiring plasmapheresis in October  2020 1. Cr up to 6.5 and pt reporting slowing urine output recently 2. Patient agrees to full treatment, including plasmapheresis if indicated 3. Have consulted Nephrology for assistance 4. Avoid nephrotoxic agents 5. Will repeat bmet in AM 2. Hypertension 1. BP stable 2. Will continue on amlodipine. 3. History of chronic diastolic CHF 1. Seems to be compensated at this time 4. History of multiple myeloma  1. Chart reviewed. Pt had previously followed with Dr. Kale 2. As of 11/20, pt had elected to stop all treatments 3. Today, pt now agrees with treatment as indicated, including plasmapheresis if needed 5. 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 Hemodynamically stable 2. Repeat CBC in AM. 6. Hx a fibrillation 1. Continued on tele for now 2. Stable at present 7. Ascending aortic aneurysm 1. Denied chest pains this AM 8. History of renal mass has not pursued further follow-up.   1. CT renal reviewed. Findings of 8.7cm L renal mass 2. Have discussed case with Urology. Pt is well known to [facility] and pt follows Dr. Herrick. Per Urology, recommendation for close outpatient f/u when discharged. Of note, patient was recommended to undergo surgery in the fall of 2020, however pt was lost to follow up with patient stating, "I had too much going on"  DVT prophylaxis: Heparin subq Code Status: Full Family Communication: Pt in room, family not at bedside  Status is: Inpatient  Remains inpatient appropriate because:Ongoing diagnostic testing needed not appropriate for outpatient work up and IV treatments appropriate due to intensity of illness or inability to take PO     Dispo: The patient is from: Home              Anticipated d/c is to: Home              Anticipated d/c date is: 3 days              Patient currently is not medically stable to d/c.        Consultants:   Nephrology  Oncology  Procedures:     Antimicrobials: Anti-infectives (From  admission, onward)   None       Subjective: Complaining of mild abd discomfort  Objective: Vitals:   01/22/20 0406 01/22/20 0821 01/22/20 1309  BP: (!) 149/89 (!) 151/92 139/85  Pulse: 64 72 69  Resp: _0 Temp: (!) 97.4 F (36.3 C) 98.1 F (36.7 C) 98 F (36.7 C)  TempSrc: Oral Oral   SpO2: 100% 98% 100%  Weight: 86.8 kg    Height: 5' 9" (1.753 m)      Intake/Output Summary (Last 24 hours) at 01/22/2020 1434 Last data filed at 01/22/2020 0500 Gross per 24 hour  Intake --  Output 250 ml  Net -250 ml   Filed Weights   01/22/20 0406  Weight: 86.8 kg    Examination:  General exam: Appears calm and comfortable  Respiratory system: Clear to auscultation. Respiratory effort normal. Cardiovascular system: S1 & S2 heard, regular Gastrointestinal system: Abdomen is nondistended, soft, No organomegaly or masses felt. Normal bowel sounds heard. Central nervous system: Alert and oriented. No focal neurological deficits. Extremities: Symmetric 5 x 5 power. Skin: No rashes, lesions Psychiatry: Judgement and insight appear normal. Mood & affect appropriate.   Data Reviewed: I have personally reviewed following labs and imaging studies  CBC: Recent Labs  Lab 01/22/20 0556  WBC 5.1  NEUTROABS 3.4  HGB 8.2*  HCT 24.9*  MCV 94.7  PLT 416   Basic Metabolic Panel: Recent Labs  Lab 01/22/20 0556  NA 139  K 3.3*  CL 103  CO2 22  GLUCOSE 86  BUN 90*  CREATININE 6.51*  CALCIUM 9.2   GFR: Estimated Creatinine Clearance: 10.7 mL/min (A) (by C-G formula based on SCr of 6.51 mg/dL (H)). Liver Function Tests: Recent Labs  Lab 01/22/20 0556  AST 16  ALT 14  ALKPHOS 74  BILITOT 1.2  PROT 6.7  ALBUMIN 3.4*   No results for input(s): LIPASE, AMYLASE in the last 168 hours. No results for input(s): AMMONIA in the last 168 hours. Coagulation Profile: No results for input(s): INR, PROTIME in the last 168 hours. Cardiac Enzymes: Recent Labs  Lab 01/22/20 0556   CKTOTAL 382   BNP (last 3 results) No results for input(s): PROBNP in the last 8760 hours. HbA1C: No results for input(s): HGBA1C in the last 72 hours. CBG: No results for input(s): GLUCAP in the last 168 hours. Lipid Profile: No results for input(s): CHOL, HDL, LDLCALC, TRIG, CHOLHDL, LDLDIRECT in the last 72 hours. Thyroid Function Tests: Recent Labs    01/22/20 0556  TSH 3.093   Anemia Panel: No results for input(s): VITAMINB12, FOLATE, FERRITIN, TIBC, IRON, RETICCTPCT in the last 72 hours. Sepsis Labs: No results for input(s): PROCALCITON, LATICACIDVEN in the last 168 hours.  Recent Results (from the past 240 hour(s))  SARS Coronavirus 2 by RT PCR (hospital order, performed in [facility] hospital lab) Nasopharyngeal Nasopharyngeal Swab     Status: None   Collection Time: 01/22/20  6:01 AM   Specimen: Nasopharyngeal Swab  Result Value Ref Range Status   SARS Coronavirus 2 NEGATIVE NEGATIVE Final    Comment: (NOTE) SARS-CoV-2 target nucleic acids are NOT DETECTED. The SARS-CoV-2 RNA is generally detectable in upper and lower respiratory specimens during the acute phase of infection. The lowest concentration of SARS-CoV-2 viral copies this assay can detect is 250 copies / mL. A negative result does not preclude SARS-CoV-2 infection and should not be used as the sole basis for treatment or other patient management decisions.  A negative result may occur with improper specimen collection / handling, submission of specimen other than nasopharyngeal swab, presence of viral mutation(s) within the areas targeted by this assay, and inadequate number of viral copies (<250 copies / mL). A negative result must be combined with clinical observations, patient history, and epidemiological information. Fact Sheet for Patients:   https://www.fda.gov/media/136312/download Fact Sheet for Healthcare Providers: https://www.fda.gov/media/136313/download This test is not yet approved or  cleared  by the [place] FDA and has been authorized for detection and/or diagnosis of SARS-CoV-2 by FDA under an Emergency Use Authorization (EUA).  This EUA will remain in effect (meaning this test can be used) for the duration of the COVID-19 declaration under Section 564(b)(1) of the Act, 21 U.S.C. section 360bbb-3(b)(1), unless the authorization is terminated or revoked sooner. Performed at [facility], [address] [place], [place] [address]      Radiology Studies: DG CHEST PORT 1 VIEW  Result Date: 01/22/2020 CLINICAL DATA:  Shortness of breath EXAM: PORTABLE CHEST 1 VIEW COMPARISON:  11/30/2018 FINDINGS: Cardiomegaly and aortic tortuosity. There is no edema, consolidation, effusion, or pneumothorax. Hazy density at the right apex is chronic and attributed to ectatic vessels based on 2018 chest CT. IMPRESSION: Cardiomegaly without failure. Electronically Signed   By: Jonathon  Watts M.D.   On: 01/22/2020 07:06   CT RENAL STONE STUDY  Result Date: 01/22/2020 CLINICAL DATA:  73-year-old male with a history of flank pain EXAM: CT ABDOMEN AND PELVIS WITHOUT CONTRAST TECHNIQUE: Multidetector CT imaging of the abdomen and pelvis was performed following the standard protocol without IV contrast. COMPARISON:  Ultrasound 06/26/2019, CT 06/01/2019 FINDINGS: Lower chest: No acute finding of the lower chest. Hepatobiliary: Similar appearance of liver with small right-sided hypodense focus measuring 11 mm and additional small subcentimeter hypodense foci within the right liver. Gallbladder decompressed. Pancreas: Unremarkable Spleen: Unremarkable Adrenals/Urinary Tract: - Right adrenal gland:  Unremarkable - Left adrenal gland: Unremarkable. - Right kidney: Similar appearance of the right kidney with multiple rounded lesions some of which are hyperdense and some of which are low-density. Complex lesion at the inferior renal cortex on the right estimated 4.7 cm x 4.5 cm, similar to the  comparison. No evidence of hydronephrosis or nephrolithiasis. Unremarkable course of the right ureter. - Left Kidney: No evidence of left-sided hydronephrosis. Unremarkable course of the left ureter. Redemonstration of multiple low-density and high density lesions of left kidney. 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 The axial dimensions on the prior CT measured 7.2 cm x 6.2 cm while the axial dimensions on the current CT are estimated 7.5 cm x 8.7 cm - Urinary Bladder: Urinary bladder is relatively unremarkable partially distended. Impression on the bladder base secondary to the median lobe of the prostate. Stomach/Bowel: - Stomach: Unremarkable. - Small bowel: Unremarkable - Appendix: Appendix is not visualized, however, no inflammatory changes are present adjacent   to the cecum to indicate an appendicitis. - Colon: Surgical changes in the region of the cecum. Redemonstration of ventral hernia containing segment of transverse colon. No evidence of obstruction. Colonic diverticula. No inflammatory changes. No significant stool burden. Vascular/Lymphatic: Atherosclerotic calcifications of the abdominal aorta. Similar diameter of the infrarenal abdominal aorta with similar course caliber and contour. Greatest diameter measures 28 mm, similar to the comparison. Atherosclerotic changes of the bilateral iliac arteries. No lymphadenopathy. Reproductive: Enlarged prostate with the diameter on axial images measuring 6.7 cm x 8.2 cm. Impression on the bladder base from the median lobe. Other: Small umbilical hernia which again appears to have a knuckle of small bowel entering the hernia sac. No evidence of associated obstruction. Supraumbilical ventral wall hernia again noted containing short segment of transverse colon. No significant inflammatory changes or evidence of obstruction.  Bilateral fat containing inguinal hernia with trace fluid on the left. Musculoskeletal: No acute displaced fracture. Multilevel degenerative changes of the lumbar spine with multiple Schmorl's nodes. Vacuum disc phenomenon present at all levels except for L4-L5. No bony canal narrowing with posterior disc bulges at all levels. Degenerative changes of the bilateral hips. IMPRESSION: CT is negative for nephrolithiasis or hydronephrosis. No acute CT finding identified that would account for flank pain. 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 Urology referral is indicated if not previously completed. Redemonstration of additional bilateral renal lesions, incompletely characterized on the current CT. This includes a complex lesion at the inferior right kidney which may represent a partially cystic additional renal cell carcinoma. Again, urology referral is indicated. Unchanged appearance of a supraumbilical ventral hernia containing transverse colon. Unchanged appearance of umbilical hernia which contains short segment of small bowel, without obstruction. Prostatomegaly. Aortic Atherosclerosis (ICD10-I70.0). Unchanged diameter of the infrarenal abdominal aorta measuring 2.8 cm. Ectatic abdominal aorta at risk for aneurysm development. Recommend followup by ultrasound in 5 years. This recommendation follows ACR consensus guidelines: White Paper of the ACR Incidental Findings Committee II on Vascular Findings. J Am Coll Radiol 2013; 10:789-794. Additional ancillary findings as above. Electronically Signed   By: Jaime  Wagner D.O.   On: 01/22/2020 12:38    Scheduled Meds: . amLODipine  10 mg Oral Daily  . atorvastatin  40 mg Oral Daily  . feeding supplement (ENSURE ENLIVE)  237 mL Oral BID BM  . heparin  5,000 Units Subcutaneous Q8H  . tamsulosin  0.4 mg Oral Daily   Continuous  Infusions:   LOS: 0 days   Stephen Chiu, MD [facility] Pager On Amion  If 7PM-7AM, please contact night-coverage 01/22/2020, 2:34 PM    

## 2020-01-22 NOTE — Plan of Care (Signed)
  Problem: Education: Goal: Knowledge of General Education information will improve Description Including pain rating scale, medication(s)/side effects and non-pharmacologic comfort measures Outcome: Progressing   

## 2020-01-22 NOTE — Consult Note (Signed)
[place] [phone]   Fax:([phone]) [place] NOTE  Patient Care Team: Nolene Ebbs, MD as PCP - General (Internal Medicine) Minus Breeding, MD as PCP - Cardiology (Cardiology)  Hematological/Oncological History # Multiple Myeloma 1) Patient began treatment with CyBorD under the care of Dr. Irene Limbo in late 2019, early 2020.  2) Patient began no showing for visits and was not available by telephone.  3) 06/23/2019: patient re-established with Dr. Irene Limbo. Plan to restart myeloma treatment after nephrology evaluation.  4) 07/21/2020: last clinic visit with Dr. Irene Limbo. Wished to d/c all oncological treatment and follow up.   #Renal Mass Concerning for RCC 1) 08/19/2018: PET CT performed during myeloma evaluation. Patient found to have large intensely hypermetabolic mass exophytic from the upper pole of the LEFT kidney is consistent with primary RENAL NEOPLASM. 2) 10/2-10/12/2018: kidney re-imaged via [place], demonstrate solid-appearing lesion arises from the upper pole of the left kidney measuring 7.6 cm. 3) 01/22/2020: CT Abdomen WO shows interval growth of the presumed renal cell carcinoma of the superior cortex of the left kidney which has been present  CHIEF COMPLAINTS/PURPOSE OF CONSULTATION:  "Multiple Myeloma and RCC with worsening renal failure "  HISTORY OF PRESENTING ILLNESS:  [name] [name] 74 y.o. male with medical history significant for multiple myeloma and renal cell carcinoma followed by Dr. Irene Limbo at the [facility] health cancer center who presented to the [facility] emergency department with increasing weakness.  He was admitted for worsening AKI on CKD in the setting of untreated multiple myeloma and renal cell carcinoma.  On review of the previous records [name] [name] was followed by Dr. Irene Limbo at the [facility] health cancer center.  His last clinic visit was on 07/22/2019.  At that time the patient noted that he wished to have his port removed  and not want to undergo any further treatment for his multiple myeloma.  He believed that through faith he would be able to cure himself.  Yesterday he presented to [facility] emergency department on 01/21/2020.  Initially on admission he was found to have a creatinine of 6.7 (steadily increasing from baseline of approximately 4.0), hemoglobin 8.4 (stable from baseline), potassium 3.4.  He was reportedly experiencing poor appetite and uremic symptoms and due to this was transferred to [facility] for further evaluation and management.  CT scan was performed on 01/22/2020 which showed interval increase of the presumed renal cell carcinoma of the left kidney, with the tumor increasing in size to 7.5 x 8.7 cm from 7.2 x 6.2 cm on previous evaluation approximately 6 months ago.  On exam today [name] [name] is accompanied by his daughter and several of his other relatives via telephone.  He reports that he feels "much much better" today as compared to yesterday prior to admission.  He reports that he is not having any issues with shortness of breath, chest pain, or nausea and vomiting.  He denies having any swelling of his lower extremity, but does complain about discomfort from a hernia in center of his abdomen.  He notes that he is agreeable to pursuing treatment for his numerous conditions including renal failure, multiple myeloma, and RCC.  He currently denies having any issues with fevers, chills, sweats, nausea, vomiting or diarrhea.  A full 10 point ROS is listed below.  MEDICAL HISTORY:  Past Medical History:  Diagnosis Date  . Abnormal CT of liver    a. nodular contour suggesting cirrhosis 05/2018.  [name] Abnormal LFTs (liver function  tests)   . Anxiety   . Aortic root dilatation ([place])   . Ascending aortic aneurysm ([place])   . BPH with urinary obstruction   . Bradycardia   . 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 CAD  (coronary artery disease)    a. 12/2015 NSTEMI/Cath (in setting of PAF):  LM nl, LAD 30p,  LCX 73m RCA  ok, AM 100%, RPDA1 40, RPDA 2 60 ->Med Rx.  . Childhood asthma   . Chronic diastolic CHF (congestive heart failure) ([place]    a. 12/2015 Echo: EF 50-55%, mod AI, mod Ao root dil, mild MR, mod dil LA, mod RA.  . CKD (chronic kidney disease), stage III   . Colon cancer ([place]   . COPD (chronic obstructive pulmonary disease) ([place]    pt denies at preop  . Diabetes mellitus type II    diet controlled  . History of syncope   . Hyperlipidemia   . Hypertensive heart disease   . Kidney lump 04/04/2010   Overview:  Renal Cell Carcinoma   . 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 Moderate aortic insufficiency    a. 12/2015 Echo: Mod AI.  .Marland KitchenParoxysmal atrial fibrillation ([place]    a. 12/2015 started on Xarelto (CHA2DS2VASc = 4-5).  . Pneumonia   . Prostate cancer ([place]   . Renal cell carcinoma ([place]   . Sleep apnea    Does not like  CPAP  . Spinal stenosis in cervical region 10/12/2016  . Venous insufficiency     SURGICAL HISTORY: Past Surgical History:  Procedure Laterality Date  . BACK SURGERY    . CARDIAC CATHETERIZATION N/A 12/18/2015   Procedure: Left Heart Cath and Coronary Angiography;  Surgeon: DLeonie Man MD;  [facility];  Service: Cardiovascular;  Laterality: N/A;  . DIAGNOSTIC LAPAROSCOPY     partial colectomy  . IR FLUORO GUIDE CV LINE RIGHT  06/28/2019  . IR REMOVAL TUN CV CATH W/O FL  07/26/2019  . IR [place] VASC ACCESS RIGHT  06/28/2019  . LAPAROSCOPIC PARTIAL COLECTOMY Right 05/21/2018   Procedure: LAPAROSCOPIC RIGHT  COLECTOMY ERAS PATHWAY;  Surgeon: TLeighton Ruff MD;  Location: [facility];  Service: General;  Laterality: Right;  . [place]  "replaced a disc"    SOCIAL HISTORY: Social History   Socioeconomic History  . Marital status: Widowed    Spouse name: Not on file  . Number of children: Not on file  . Years of education: Not on  file  . Highest education level: Not on file  Occupational History  . Not on file  Tobacco Use  . Smoking status: Former Smoker    Types: Cigars    Quit date: 09/02/1977    Years since quitting: 42.4  . Smokeless tobacco: Never Used  . Tobacco comment:    Substance and Sexual Activity  . Alcohol use: Not Currently    Comment: "stopped drinking alcohol in ~ 1980; just drank a little on the weekends when I did drink"  . Drug use: No  . Sexual activity: Not Currently  Other Topics Concern  . Not on file  Social History Narrative   Lives alone.  Wife died in J2024-07-14  Lives in apartment.     Social Determinants of Health   Financial Resource Strain:   . Difficulty of Paying Living Expenses:   Food Insecurity:   .  Worried About Charity fundraiser in the Last Year:   . Arboriculturist in the Last Year:   Transportation Needs:   . Film/video editor (Medical):   [name] Lack of Transportation (Non-Medical):   Physical Activity:   . Days of Exercise per Week:   . Minutes of Exercise per Session:   Stress:   . Feeling of Stress :   Social Connections:   . Frequency of Communication with Friends and Family:   . Frequency of Social Gatherings with Friends and Family:   . Attends Religious Services:   . Active Member of Clubs or Organizations:   . Attends Archivist Meetings:   [name] Marital Status:   Intimate Partner Violence:   . Fear of Current or Ex-Partner:   . Emotionally Abused:   [name] Physically Abused:   . Sexually Abused:     FAMILY HISTORY: Family History  Problem Relation Age of Onset  . Emphysema Father   . Asthma Father   . Liver disease Father        tumor  . Heart disease Mother   . Hypertension Mother   . Asthma Sister   . Hypertension Son     ALLERGIES:  is allergic to amoxicillin and penicillins.  MEDICATIONS:  Current Facility-Administered Medications  Medication Dose Route Frequency Provider Last Rate Last Admin  . amLODipine (NORVASC) tablet  10 mg  10 mg Oral Daily Rise Patience, MD   10 mg at 01/22/20 0949  . atorvastatin (LIPITOR) tablet 40 mg  40 mg Oral Daily Rise Patience, MD   40 mg at 01/22/20 0949  . feeding supplement (ENSURE ENLIVE) (ENSURE ENLIVE) liquid 237 mL  237 mL Oral BID BM Donne Hazel, MD   237 mL at 01/22/20 1342  . heparin injection 5,000 Units  5,000 Units Subcutaneous Q8H Rise Patience, MD   5,000 Units at 01/22/20 1338  . ondansetron (ZOFRAN) tablet 4 mg  4 mg Oral Q6H PRN Rise Patience, MD       Or  . ondansetron [facility]) injection 4 mg  4 mg Intravenous Q6H PRN Rise Patience, MD      . tamsulosin [facility]) capsule 0.4 mg  0.4 mg Oral Daily Rise Patience, MD   0.4 mg at 01/22/20 4825    REVIEW OF SYSTEMS:   Constitutional: ( - ) fevers, ( - )  chills , ( - ) night sweats Eyes: ( - ) blurriness of vision, ( - ) double vision, ( - ) watery eyes Ears, nose, mouth, throat, and face: ( - ) mucositis, ( - ) sore throat Respiratory: ( - ) cough, ( - ) dyspnea, ( - ) wheezes Cardiovascular: ( - ) palpitation, ( - ) chest discomfort, ( - ) lower extremity swelling Gastrointestinal:  ( - ) nausea, ( - ) heartburn, ( - ) change in bowel habits Skin: ( - ) abnormal skin rashes Lymphatics: ( - ) new lymphadenopathy, ( - ) easy bruising Neurological: ( - ) numbness, ( - ) tingling, ( - ) new weaknesses Behavioral/Psych: ( - ) mood change, ( - ) new changes  All other systems were reviewed with the patient and are negative.  PHYSICAL EXAMINATION: ECOG PERFORMANCE STATUS: 2 - Symptomatic, <50% confined to bed  Vitals:   01/22/20 0821 01/22/20 1309  BP: (!) 151/92 139/85  Pulse: 72 69  Resp: 16 16  Temp: 98.1 F (36.7 C) 98 F (36.7  C)  SpO2: 98% 100%   Filed Weights   01/22/20 0406  Weight: 191 lb 5.8 oz (86.8 kg)    GENERAL: chronically ill appearing elderly African American male in NAD  SKIN: skin color, texture, turgor are normal, no rashes or significant  lesions EYES: conjunctiva are pink and non-injected, sclera clear. Bialteral proptosis.  LUNGS: clear to auscultation and percussion with normal breathing effort HEART: regular rate & rhythm and no murmurs and no lower extremity edema ABDOMEN: soft, non-tender, non-distended, normal bowel sounds. Midline hernia.  Musculoskeletal: no cyanosis of digits and no clubbing  PSYCH: alert & oriented x 3, fluent speech NEURO: no focal motor/sensory deficits  LABORATORY DATA:  I have reviewed the data as listed CBC Latest Ref Rng & Units 01/22/2020 01/12/2020 01/08/2020  WBC 4.0 - 10.5 K/uL 5.1 5.2 5.2  Hemoglobin 13.0 - 17.0 g/dL 8.2(L) 9.0(L) 7.8(L)  Hematocrit 39.0 - 52.0 % 24.9(L) 27.2(L) 23.7(L)  Platelets 150 - 400 K/uL 213 220 186    CMP Latest Ref Rng & Units 01/22/2020 01/08/2020 12/24/2019  Glucose 70 - 99 mg/dL 86 152(H) 100(H)  BUN 8 - 23 mg/dL 90(H) 73(H) 73(H)  Creatinine 0.61 - 1.24 mg/dL 6.51(H) 5.63(H) 4.95(H)  Sodium 135 - 145 mmol/L 139 135 138  Potassium 3.5 - 5.1 mmol/L 3.3(L) 3.1(L) 3.3(L)  Chloride 98 - 111 mmol/L 103 101 103  CO2 22 - 32 mmol/L 22 24 21(L)  Calcium 8.9 - 10.3 mg/dL 9.2 9.0 9.3  Total Protein 6.5 - 8.1 g/dL 6.7 - 6.8  Total Bilirubin 0.3 - 1.2 mg/dL 1.2 - 0.4  Alkaline Phos 38 - 126 U/L 74 - 73  AST 15 - 41 U/L 16 - 14(L)  ALT 0 - 44 U/L 14 - 12     PATHOLOGY: 06/12/2018: bone marrow biopsy with results showing 40% clonal plasma cells, consistent with multiple myeloma.   RADIOGRAPHIC STUDIES:  DG CHEST PORT 1 VIEW  Result Date: 01/22/2020 CLINICAL DATA:  Shortness of breath EXAM: PORTABLE CHEST 1 VIEW COMPARISON:  11/30/2018 FINDINGS: Cardiomegaly and aortic tortuosity. There is no edema, consolidation, effusion, or pneumothorax. Hazy density at the right apex is chronic and attributed to ectatic vessels based on 2018 chest CT. IMPRESSION: Cardiomegaly without failure. Electronically Signed   By: Monte Fantasia M.D.   On: 01/22/2020 07:06   CT RENAL  STONE STUDY  Result Date: 01/22/2020 CLINICAL DATA:  74 year old male with a history of flank pain EXAM: CT ABDOMEN AND PELVIS WITHOUT CONTRAST TECHNIQUE: Multidetector CT imaging of the abdomen and pelvis was performed following the standard protocol without IV contrast. COMPARISON:  Ultrasound 06/26/2019, CT 06/01/2019 FINDINGS: Lower chest: No acute finding of the lower chest. Hepatobiliary: Similar appearance of liver with small right-sided hypodense focus measuring 11 mm and additional small subcentimeter hypodense foci within the right liver. Gallbladder decompressed. Pancreas: Unremarkable Spleen: Unremarkable Adrenals/Urinary Tract: - Right adrenal gland:  Unremarkable - Left adrenal gland: Unremarkable. - Right kidney: Similar appearance of the right kidney with multiple rounded lesions some of which are hyperdense and some of which are low-density. Complex lesion at the inferior renal cortex on the right estimated 4.7 cm x 4.5 cm, similar to the comparison. No evidence of hydronephrosis or nephrolithiasis. Unremarkable course of the right ureter. - Left Kidney: No evidence of left-sided hydronephrosis. Unremarkable course of the left ureter. Redemonstration of multiple low-density and high density lesions of left kidney. Interval growth of the presumed renal cell carcinoma of the superior cortex of the left  kidney which has been present on multiple prior studies including PET CT of 08/19/2018. The axial dimensions on the prior CT measured 7.2 cm x 6.2 cm while the axial dimensions on the current CT are estimated 7.5 cm x 8.7 cm - Urinary Bladder: Urinary bladder is relatively unremarkable partially distended. Impression on the bladder base secondary to the median lobe of the prostate. Stomach/Bowel: - Stomach: Unremarkable. - Small bowel: Unremarkable - Appendix: Appendix is not visualized, however, no inflammatory changes are present adjacent to the cecum to indicate an appendicitis. - Colon: Surgical  changes in the region of the cecum. Redemonstration of ventral hernia containing segment of transverse colon. No evidence of obstruction. Colonic diverticula. No inflammatory changes. No significant stool burden. Vascular/Lymphatic: Atherosclerotic calcifications of the abdominal aorta. Similar diameter of the infrarenal abdominal aorta with similar course caliber and contour. Greatest diameter measures 28 mm, similar to the comparison. Atherosclerotic changes of the bilateral iliac arteries. No lymphadenopathy. Reproductive: Enlarged prostate with the diameter on axial images measuring 6.7 cm x 8.2 cm. Impression on the bladder base from the median lobe. Other: Small umbilical hernia which again appears to have a knuckle of small bowel entering the hernia sac. No evidence of associated obstruction. Supraumbilical ventral wall hernia again noted containing short segment of transverse colon. No significant inflammatory changes or evidence of obstruction. Bilateral fat containing inguinal hernia with trace fluid on the left. Musculoskeletal: No acute displaced fracture. Multilevel degenerative changes of the lumbar spine with multiple Schmorl's nodes. Vacuum disc phenomenon present at all levels except for L4-L5. No bony canal narrowing with posterior disc bulges at all levels. Degenerative changes of the bilateral hips. IMPRESSION: CT is negative for nephrolithiasis or hydronephrosis. No acute CT finding identified that would account for flank pain. 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 Urology referral is indicated if not previously completed. Redemonstration of additional bilateral renal lesions, incompletely characterized on the current CT. This includes a complex lesion at the inferior right kidney which may represent a partially cystic additional renal cell carcinoma. Again,  urology referral is indicated. Unchanged appearance of a supraumbilical ventral hernia containing transverse colon. Unchanged appearance of umbilical hernia which contains short segment of small bowel, without obstruction. Prostatomegaly. Aortic Atherosclerosis (ICD10-I70.0). Unchanged diameter of the infrarenal abdominal aorta measuring 2.8 cm. Ectatic abdominal aorta at risk for aneurysm development. Recommend followup by ultrasound in 5 years. This recommendation follows ACR consensus guidelines: White Paper of the ACR Incidental Findings Committee II on Vascular Findings. J Am Coll Radiol 2013; 10:789-794. Additional ancillary findings as above. Electronically Signed   By: Corrie Mckusick D.O.   On: 01/22/2020 12:38    ASSESSMENT & PLAN [name] [name] 74 y.o. male with medical history significant for multiple myeloma and renal cell carcinoma followed by Dr. Irene Limbo at the [facility] health cancer center who presented to the [facility] emergency department with increasing weakness.  He was admitted for worsening AKI on CKD in the setting of untreated multiple myeloma and renal cell carcinoma.  After review of labs, review of the prior notes, and review the imaging the patient's findings are most consistent with uncontrolled multiple myeloma and renal cell carcinoma.  Before any interventions are undertaken I would recommend a detailed goals of care discussion with the patient to assure that he does indeed want treatment for his multiple myeloma and possible renal cell  carcinoma.  Additionally given his marked decline in kidney function it would be reasonable to have nephrology evaluate for consideration of dialysis.  Urological consultation will be required for consideration of surgical resection of his mass.  This would also provide value in that if the mass were unresectable or the patient declined surgery it could help the shape his overall treatment plan and goals of care.  At this time there is no  clear evidence of hyperviscosity syndrome and therefore I do not think plasmapheresis is indicated at this time.  Once nephrology has had an opportunity to weigh in on the patient's kidney function we could consider systemic treatment for his multiple myeloma, though based on his history of noncompliance and missed clinic visits he is a less than ideal candidate for treatment.  We could defer to his primary oncologist for whether or not he would wish to pursue treatment in the outpatient setting.  # Untreated Multiple Myeloma # Acute on Chronic Kidney Injury --the patient has marked renal failure, uncontrolled myeloma with poor compliance history and large renal mass. Goals of care discussion are appropriate at this time. The patient would be an appropriate candidate for hospice/ palliative care consultation.  --recommend consultation with nephrology for consideration of dialysis given his severe renal dysfunction --please order a Multiple Myeloma Panel (SPEP/IFE/quant Ig), UPEP, and Serum Free Light chains to access the extent of his myeloma --no clear evidence of hyperviscosity syndrome. No need for urgent plasmapheresis at this time --if patient declines comfort based care, multiple myeloma treatment can be considered once his acute renal issues are addressed. Will defer to the patient's primary hematologists regarding the regimen.  --Hematology will continue to follow.   #Renal Mass Concerning for RCC --goals of care discussions are appropriate at this time. If patient would like to pursue further treatment, recommend outpatient consult to urology service for evaluation of his renal mass   All questions were answered. The patient knows to call the clinic with any problems, questions or concerns.  A total of more than 55 minutes were spent on this encounter and over half of that time was spent on counseling and coordination of care as outlined above.   Ledell Peoples, MD Department of  Hematology/Oncology [facility] Phone: [phone] Pager: [phone] Email: [email or web address]  01/22/2020 2:30 PM

## 2020-01-22 NOTE — H&P (Signed)
History and Physical    [name] [name] ZOX:[id number] DOB: 1945-09-10 DOA: 01/22/2020  PCP: Nolene Ebbs, MD   Patient coming from: Home.  Patient was transferred from [facility].  Chief Complaint: Weakness.  HPI: [name] [name] is a 74 y.o. male with history of multiple myeloma renal mass diastolic CHF hypertension anemia had been originally planned to follow-up with oncologist on June 1 week for potentially starting chemo for his multiple myeloma was found to be increasing weakness and had presented to the ER at [facility] yesterday.  Over the patient's creatinine is found to be around 6.7 increased from 5.62 weeks ago hemoglobin was stable around 8.4.  Potassium was 3.4.  Patient on exam did not look like patient being any fluid overload.  But due to worsening renal function patient also in addition complaining of poor appetite and uremic symptoms patient was transferred to [facility] at the request of patient and patient's family since patient was already seen by oncologist aware.  On my exam patient is not in distress.  Denies any chest pain or shortness of breath.  In the month of October 2020 patient was admitted for acute renal failure in the setting of multiple myeloma had undergone 3 plasmapheresis treatment at the time.  Patient states since then patient had not followed up with oncologist at [facility] and had proceeded to follow-up with [facility] oncologist again with nephrology follow-up.  Per the report patient has not pursued further work-up on his renal mass.  ED Course: Patient was a direct admit.  Review of Systems: As per HPI, rest all negative.   Past Medical History:  Diagnosis Date  . Abnormal CT of liver    a. nodular contour suggesting cirrhosis 05/2018.  [name] Abnormal LFTs (liver function tests)   . Anxiety   . Aortic root dilatation ([place])   . Ascending aortic aneurysm ([place])   . BPH with urinary obstruction   . Bradycardia   . C5-C7 level with  spinal cord injury with central cord syndrome, without evidence of spinal bone injury ([place]) 10/12/2016  . CAD (coronary artery disease)    a. 12/2015 NSTEMI/Cath (in setting of PAF):  LM nl, LAD 30p,  LCX 84m RCA  ok, AM 100%, RPDA1 40, RPDA 2 60 ->Med Rx.  . Childhood asthma   . Chronic diastolic CHF (congestive heart failure) ([place]    a. 12/2015 Echo: EF 50-55%, mod AI, mod Ao root dil, mild MR, mod dil LA, mod RA.  . CKD (chronic kidney disease), stage III   . Colon cancer ([place]   . COPD (chronic obstructive pulmonary disease) ([place]    pt denies at preop  . Diabetes mellitus type II    diet controlled  . History of syncope   . Hyperlipidemia   . Hypertensive heart disease   . Kidney lump 04/04/2010   Overview:  Renal Cell Carcinoma   . Light chain myeloma ([place]   . Moderate aortic insufficiency    a. 12/2015 Echo: Mod AI.  .Marland KitchenParoxysmal atrial fibrillation ([place]    a. 12/2015 started on Xarelto (CHA2DS2VASc = 4-5).  . Pneumonia   . Prostate cancer ([place]   . Renal cell carcinoma ([place]   . Sleep apnea    Does not like  CPAP  . Spinal stenosis in cervical region 10/12/2016  . Venous insufficiency     Past Surgical History:  Procedure Laterality Date  . BACK SURGERY    . CARDIAC CATHETERIZATION N/A 12/18/2015  Procedure: Left Heart Cath and Coronary Angiography;  Surgeon: Leonie Man, MD;  Location: [place] CV [facility];  Service: Cardiovascular;  Laterality: N/A;  . DIAGNOSTIC LAPAROSCOPY     partial colectomy  . IR FLUORO GUIDE CV LINE RIGHT  06/28/2019  . IR REMOVAL TUN CV CATH W/O FL  07/26/2019  . IR US GUIDE VASC ACCESS RIGHT  06/28/2019  . LAPAROSCOPIC PARTIAL COLECTOMY Right 05/21/2018   Procedure: LAPAROSCOPIC RIGHT  COLECTOMY ERAS PATHWAY;  Surgeon: Leighton Ruff, MD;  [facility];  Service: General;  Laterality: Right;  . [place]   "replaced a disc"     reports that he quit smoking about 42 years ago. His smoking use included cigars. He has  never used smokeless tobacco. He reports previous alcohol use. He reports that he does not use drugs.  Allergies  Allergen Reactions  . Amoxicillin Other (See Comments)    Tolerates Unasyn. Can't move Has patient had a PCN reaction causing immediate rash, facial/tongue/throat swelling, SOB or lightheadedness with hypotension: No Has patient had a PCN reaction causing severe rash involving mucus membranes or skin necrosis: No Has patient had a PCN reaction that required hospitalization No Has patient had a PCN reaction occurring within the last 10 years: No If all of the above answers are "NO", then may proceed with Cephalosporin use.   [name] Penicillins Other (See Comments)    Tolerates Unasyn. Can't move/dizziness Has patient had a PCN reaction causing immediate rash, facial/tongue/throat swelling, SOB or lightheadedness with hypotension: No Has patient had a PCN reaction causing severe rash involving mucus membranes or skin necrosis: No Has patient had a PCN reaction that required hospitalization No Has patient had a PCN reaction occurring within the last 10 years: No If all of the above answers are "NO", then may proceed with Cephalosporin use.      Family History  Problem Relation Age of Onset  . Emphysema Father   . Asthma Father   . Liver disease Father        tumor  . Heart disease Mother   . Hypertension Mother   . Asthma Sister   . Hypertension Son     Prior to Admission medications   Medication Sig Start Date End Date Taking? Authorizing Provider  amLODipine (NORVASC) 10 MG tablet Take 1 tablet (10 mg total) by mouth daily. 01/12/20   [name], DO  atorvastatin (LIPITOR) 40 MG tablet Take 40 mg by mouth daily.    [provider]  cetirizine (ZYRTEC) 10 MG tablet TAKE 1 TABLET (10 MG TOTAL) BY MOUTH DAILY AS NEEDED (SEASONAL ALLERGIES). 11/12/19   [name], DO  Cholecalciferol (VITAMIN D3 PO) Take 1 tablet by mouth daily.     [provider]  Cyanocobalamin (VITAMIN B-12 PO) Take 1 tablet by mouth daily.    [provider]  glucose blood (ONETOUCH ULTRA) test strip Check FSBS twice a day. Dx: E11.21, N18.32 08/16/19   Fulp, Ander Gaster, MD  HYDROcodone-acetaminophen (NORCO/VICODIN) 5-325 MG tablet Take 1-2 tablets by mouth every 6 (six) hours as needed (gout pain). 12/29/19   Orvan July, NP  Omega-3 Fatty Acids (FISH OIL PO) Take 1 capsule by mouth daily.     [provider]  tamsulosin (FLOMAX) 0.4 MG CAPS capsule Take 1 capsule (0.4 mg total) by mouth daily. 10/15/19   [name], DO    Physical Exam: Constitutional: Moderately built and nourished. Vitals:   01/22/20 0406  BP: (!) 149/89  Pulse: 64  Resp: 18  Temp: (!) 97.4 F (36.3 C)  TempSrc: Oral  SpO2: 100%  Weight: 86.8 kg  Height: _0  (1.753 m)   Eyes: Anicteric no pallor. ENMT: No discharge from the ears eyes nose or mouth. Neck: No mass felt.  No neck rigidity. Respiratory: No rhonchi or crepitations. Cardiovascular: S1-S2 heard. Abdomen: Soft nontender bowel sounds present. Musculoskeletal: No edema. Skin: No rash. Neurologic: Alert awake oriented to time place and person.  Moves all extremities. Psychiatric: Appears normal prolonged affect.   Labs on Admission: I have personally reviewed following labs and imaging studies  CBC: No results for input(s): WBC, NEUTROABS, HGB, HCT, MCV, PLT in the last 168 hours. Basic Metabolic Panel: No results for input(s): NA, K, CL, CO2, GLUCOSE, BUN, CREATININE, CALCIUM, MG, PHOS in the last 168 hours. GFR: Estimated Creatinine Clearance: 12.4 mL/min (A) (by C-G formula based on SCr of 5.63 mg/dL (H)). Liver Function Tests: No results for input(s): AST, ALT, ALKPHOS, BILITOT, PROT, ALBUMIN in the last 168 hours. No results for input(s): LIPASE, AMYLASE in the last 168 hours. No results for input(s): AMMONIA in the last 168 hours. Coagulation Profile: No  results for input(s): INR, PROTIME in the last 168 hours. Cardiac Enzymes: No results for input(s): CKTOTAL, CKMB, CKMBINDEX, TROPONINI in the last 168 hours. BNP (last 3 results) No results for input(s): PROBNP in the last 8760 hours. HbA1C: No results for input(s): HGBA1C in the last 72 hours. CBG: No results for input(s): GLUCAP in the last 168 hours. Lipid Profile: No results for input(s): CHOL, HDL, LDLCALC, TRIG, CHOLHDL, LDLDIRECT in the last 72 hours. Thyroid Function Tests: No results for input(s): TSH, T4TOTAL, FREET4, T3FREE, THYROIDAB in the last 72 hours. Anemia Panel: No results for input(s): VITAMINB12, FOLATE, FERRITIN, TIBC, IRON, RETICCTPCT in the last 72 hours. Urine analysis:    Component Value Date/Time   COLORURINE YELLOW 01/08/2020 [place] 01/08/2020 1355   LABSPEC 1.011 01/08/2020 1355   PHURINE 6.0 01/08/2020 1355   GLUCOSEU NEGATIVE 01/08/2020 1355   HGBUR SMALL (A) 01/08/2020 [place] 01/08/2020 [place] 01/08/2020 1355   PROTEINUR 100 (A) 01/08/2020 1355   NITRITE NEGATIVE 01/08/2020 1355   LEUKOCYTESUR NEGATIVE 01/08/2020 1355   Sepsis Labs: _1 (procalcitonin:4,lacticidven:4) )No results found for this or any previous visit (from the past 240 hour(s)).   Radiological Exams on Admission: No results found.   Assessment/Plan Principal Problem:   ARF (acute renal failure) (HCC) Active Problems:   Essential (primary) hypertension   Paroxysmal atrial fibrillation (HCC)   Light chain myeloma (HCC)   CAD (coronary artery disease)   Chronic diastolic CHF (congestive heart failure) (HCC)   Anemia   Ascending aortic aneurysm ([place])    1. Acute on chronic kidney disease with history of multiple myeloma previously requiring plasmapheresis in October 2020 we will repeat labs consult nephrology in the morning.  Closely follow intake output metabolic panel. 2. Hypertension on  amlodipine. 3. History of chronic diastolic CHF appears compensated. 4. History of multiple myeloma will need oncology input. 5. Anemia likely from renal disease and multiple myeloma follow CBC. 6. History approximately fibrillation EKG is pending.  Monitor in telemetry. 7. Ascending aortic aneurysm denies any chest pain. 8. History of renal mass has not pursued further follow-up.  CT renal studies pending.  All labs including Covid test metabolic panel CBC EKG chest x-ray and CT renal study are pending.  DVT prophylaxis: Heparin. Code Status: Full code. Family Communication: Discussed with patient. Disposition Plan: To be determined. Consults called: None. Admission status: Inpatient.   Rise Patience MD [facility] Pager [phone].  If 7PM-7AM, please contact night-coverage [email or web address] Password [facility]  01/22/2020, 4:46 AM

## 2020-01-22 NOTE — Consult Note (Signed)
Renal Service Consult Note [facility]  [name] [name] 01/22/2020 [name] Requesting Physician: Dr Wyline Copas  Reason for Consult:  Renal failure HPI: The patient is a 74 y.o. year-old w/ hx of afib, HTN, HL, DM2, COPD, colon cancer, CAD, BPH, multiple myeloma, prostate cancer, renal cell carcinoma presented to outside hospital w/ gen'd weakness, creat 6.7, hx multiple myeloma, Hb 8.4.  No fluid overload. Pt transferred to [facility] at patient's request. Asked to see for renal failure.   Pt was admitted in Oct 2020 w/ AoCKD3 w/ creat 6.11 (baseline 1.5- 2) and rec'd short course of plasmapheresis and chemoRx. DC creat was 5.29. In Nov creat was 3.7- 4.7 and in Feb 2021 4.4, April 2021 creat up to 4.9- 5.1 and May 8th creat up to 5.6.    Pt seen in room, main c/o is fatigue and wt loss. Says he is eating and has decent appetite.  No n/v. No SOB or CP. Some ankel edema.   Pt says he wants to go ahead with dialysis (in the past was on the fence about it) so that he can get stronger and take the treatments for his other problems (myeloma, RCC). His wife died in 24 after 11 yrs on dialysis. He has several grown children who work in [place]. Pt is ambulatory.   Date    Creat   eGFR 2015- 18  0.80- 1.10 Sept-Dec 2019 5.99 >> 2.10  Severe AKI Feb- mar 2020 1.5- 1.7  45- 50 Oct 2020  6.11 >> 5.29  AKI episode Nov   3.7- 4.09 Aug 2019  4.10   15- 17 Jan    3.82   17 Feb   4.41 April   4.9- 5.10  12 Jan 08 2020  5.63   11 Jan 22 2020  6.51   9  ROS  denies CP  no joint pain   no HA  no blurry vision  no rash  no diarrhea  no nausea/ vomiting  no dysuria  no difficulty voiding  no change in urine color    Past Medical History  Past Medical History:  Diagnosis Date  . Abnormal CT of liver    a. nodular contour suggesting cirrhosis 05/2018.  [name] Abnormal LFTs (liver function tests)   . Anxiety   . Aortic root dilatation ([place])   . Ascending aortic aneurysm ([place])   . BPH with  urinary obstruction   . Bradycardia   . C5-C7 level with spinal cord injury with central cord syndrome, without evidence of spinal bone injury ([place]) 10/12/2016  . CAD (coronary artery disease)    a. 12/2015 NSTEMI/Cath (in setting of PAF):  LM nl, LAD 30p,  LCX 73m RCA  ok, AM 100%, RPDA1 40, RPDA 2 60 ->Med Rx.  . Childhood asthma   . Chronic diastolic CHF (congestive heart failure) ([place]    a. 12/2015 Echo: EF 50-55%, mod AI, mod Ao root dil, mild MR, mod dil LA, mod RA.  . CKD (chronic kidney disease), stage III   . Colon cancer ([place]   . COPD (chronic obstructive pulmonary disease) ([place]    pt denies at preop  . Diabetes mellitus type II    diet controlled  . History of syncope   . Hyperlipidemia   . Hypertensive heart disease   . Kidney lump 04/04/2010   Overview:  Renal Cell Carcinoma   . Light chain myeloma ([place]   . Moderate aortic insufficiency    a.  12/2015 Echo: Mod AI.  [name] Paroxysmal atrial fibrillation ([place])    a. 12/2015 started on Xarelto (CHA2DS2VASc = 4-5).  . Pneumonia   . Prostate cancer ([place])   . Renal cell carcinoma ([place])   . Sleep apnea    Does not like  CPAP  . Spinal stenosis in cervical region 10/12/2016  . Venous insufficiency    Past Surgical History  Past Surgical History:  Procedure Laterality Date  . BACK SURGERY    . CARDIAC CATHETERIZATION N/A 12/18/2015   Procedure: Left Heart Cath and Coronary Angiography;  Surgeon: Leonie Man, MD;  Location: [place] CV LAB;  Service: Cardiovascular;  Laterality: N/A;  . DIAGNOSTIC LAPAROSCOPY     partial colectomy  . IR FLUORO GUIDE CV LINE RIGHT  06/28/2019  . IR REMOVAL TUN CV CATH W/O FL  07/26/2019  . IR US GUIDE VASC ACCESS RIGHT  06/28/2019  . LAPAROSCOPIC PARTIAL COLECTOMY Right 05/21/2018   Procedure: LAPAROSCOPIC RIGHT  COLECTOMY ERAS PATHWAY;  Surgeon: Leighton Ruff, MD;  Location[facility];  Service: General;  Laterality: Right;  . [place]   "replaced a disc"   Family  History  Family History  Problem Relation Age of Onset  . Emphysema Father   . Asthma Father   . Liver disease Father        tumor  . Heart disease Mother   . Hypertension Mother   . Asthma Sister   . Hypertension Son    Social History  reports that he quit smoking about 42 years ago. His smoking use included cigars. He has never used smokeless tobacco. He reports previous alcohol use. He reports that he does not use drugs. Allergies  Allergies  Allergen Reactions  . Amoxicillin Other (See Comments)    Tolerates Unasyn. Can't move Has patient had a PCN reaction causing immediate rash, facial/tongue/throat swelling, SOB or lightheadedness with hypotension: No Has patient had a PCN reaction causing severe rash involving mucus membranes or skin necrosis: No Has patient had a PCN reaction that required hospitalization No Has patient had a PCN reaction occurring within the last 10 years: No If all of the above answers are "NO", then may proceed with Cephalosporin use.   [name] Penicillins Other (See Comments)    Tolerates Unasyn. Can't move/dizziness Has patient had a PCN reaction causing immediate rash, facial/tongue/throat swelling, SOB or lightheadedness with hypotension: No Has patient had a PCN reaction causing severe rash involving mucus membranes or skin necrosis: No Has patient had a PCN reaction that required hospitalization No Has patient had a PCN reaction occurring within the last 10 years: No If all of the above answers are "NO", then may proceed with Cephalosporin use.     Home medications Prior to Admission medications   Medication Sig Start Date End Date Taking? Authorizing Provider  amLODipine (NORVASC) 10 MG tablet Take 1 tablet (10 mg total) by mouth daily. 01/12/20  Yes [name], DO  atorvastatin (LIPITOR) 40 MG tablet Take 40 mg by mouth daily.    [provider]  cetirizine (ZYRTEC) 10 MG tablet TAKE 1 TABLET (10 MG TOTAL) BY MOUTH DAILY AS  NEEDED (SEASONAL ALLERGIES). 11/12/19   [name], DO  Cholecalciferol (VITAMIN D3 PO) Take 1 tablet by mouth daily.    [provider]  Cyanocobalamin (VITAMIN B-12 PO) Take 1 tablet by mouth daily.    [provider]  glucose blood (ONETOUCH ULTRA) test strip Check FSBS twice  a day. Dx: E11.21, N18.32 08/16/19   Fulp, Ander Gaster, MD  HYDROcodone-acetaminophen (NORCO/VICODIN) 5-325 MG tablet Take 1-2 tablets by mouth every 6 (six) hours as needed (gout pain). 12/29/19   Orvan July, NP  Omega-3 Fatty Acids (FISH OIL PO) Take 1 capsule by mouth daily.     [provider]  tamsulosin (FLOMAX) 0.4 MG CAPS capsule Take 1 capsule (0.4 mg total) by mouth daily. 10/15/19   [name], DO     Vitals:   01/22/20 0406 01/22/20 0821  BP: (!) 149/89 (!) 151/92  Pulse: 64 72  Resp: 18 16  Temp: (!) 97.4 F (36.3 C) 98.1 F (36.7 C)  TempSrc: Oral Oral  SpO2: 100% 98%  Weight: 86.8 kg   Height: 5' 9"  (1.753 m)    Exam Gen alert, calmNo rash, cyanosis or gangrene Sclera anicteric, throat clear No jvd or bruits Chest clear bilat to bases no rales, wheezing or bronchial BS RRR no RG, soft 1/6 sem Abd soft ntnd no mass or ascites +bs GU normal male MS no joint effusions or deformity Ext 1+ pretib edema, no wounds or ulcers Neuro is alert, Ox 3 , nf   Home meds:  - norvasc 10/ lipitor 40  - norco prn/ flomax 0.4 qd  - prn's/ vitamins/ supplements  UA 5/22 - negative, prot 30   Assessment/ Plan: 1. 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  Pt wants to proceed with dialysis (in the past was not sure). He c/o fatigue and wt loss and w/ eGFR < 10 would recommend proceeding with dialysis. Will ask IR to see for [facility] Monday. Ordering vein mapping. Will need perm access as well prior to dc.  2. Multiple myeloma - f/b ONC 3. L renal mass - seen by urology in Oct 2020, suspected RCC, not  sure what current status is.  4. BP/ vol - cont norvasc, mild LE edema on exam otherwise euvolemic.  5. BPH on flomax 6. Anemia ckd +/- malignancy - Hb 8.2 , get fe studies, start darbe 60 /wk      Kelly Splinter  MD 01/22/2020, 12:18 PM  Recent Labs  Lab 01/22/20 0556  WBC 5.1  HGB 8.2*   Recent Labs  Lab 01/22/20 0556  K 3.3*  BUN 90*  CREATININE 6.51*  CALCIUM 9.2

## 2020-01-22 NOTE — Progress Notes (Signed)
Initial Nutrition Assessment  DOCUMENTATION CODES:   Not applicable  INTERVENTION:   Ensure Enlive po BID, each supplement provides 350 kcal and 20 grams of protein  Recommend liberalizing diet to 2g sodium only; if po intake, inadequate on follow-up, recommend liberalizing to Regular   NUTRITION DIAGNOSIS:   Inadequate oral intake related to poor appetite, acute illness as evidenced by per patient/family report.  GOAL:   Patient will meet greater than or equal to 90% of their needs  MONITOR:   PO intake, Supplement acceptance, Labs, Weight trends  REASON FOR ASSESSMENT:   Malnutrition Screening Tool    ASSESSMENT:   74 yo admitted with AKI on CKD with hx of multiple myeloma previously requiring plasmapheresis on October 2020. PMH includes HTN, CKD III, CHF, renal mass.  RD working remotely.  No recorded po intake. Noted pt with complains of poor appetite and uremic symptoms on presentation. Spoke with RN who indicates pt is eating but pt states "I don't like it but Im eating it"  Current weight 86.8 kg. Noted possible weight loss per weight encounters. Pt needs Nutrition Focused Physical exam on follow-up  Pt does not require a Renal Diet at this time (potassium, phosphorus, sodium and fluid restricted) given potassium is low, phosphorus as not been checked, not on dialysis  Noted pt takes cholecalciferol and Vit B12 as outpatient.  Noted pt with plans to start chemo in June  Labs: potassium 3.3 (L) Meds: reviewed  Diet Order:   Diet Order            Diet 2 gram sodium Room service appropriate? Yes; Fluid consistency: Thin  Diet effective now              EDUCATION NEEDS:   Not appropriate for education at this time  Skin:  Skin Assessment: Reviewed RN Assessment  Last BM:  no documented BM  Height:   Ht Readings from Last 1 Encounters:  01/22/20 _0  (1.753 m)    Weight:   Wt Readings from Last 1 Encounters:  01/22/20 86.8 kg    BMI:   Body mass index is 28.26 kg/m.  Estimated Nutritional Needs:   Kcal:  2000-2200 kcals  Protein:  100-115 g  Fluid:  >/= 2L   Kerman Passey MS, RDN, LDN, CNSC RD Pager Number and RD On-Call Pager Number Located in [place]

## 2020-01-22 NOTE — Plan of Care (Signed)

## 2020-01-23 ENCOUNTER — Encounter (HOSPITAL_COMMUNITY): Payer: Medicare Other

## 2020-01-23 ENCOUNTER — Encounter (HOSPITAL_COMMUNITY): Payer: Self-pay | Admitting: Internal Medicine

## 2020-01-23 DIAGNOSIS — I2510 Atherosclerotic heart disease of native coronary artery without angina pectoris: Secondary | ICD-10-CM

## 2020-01-23 LAB — COMPREHENSIVE METABOLIC PANEL
ALT: 13 U/L (ref 0–44)
AST: 12 U/L — ABNORMAL LOW (ref 15–41)
Albumin: 3.3 g/dL — ABNORMAL LOW (ref 3.5–5.0)
Alkaline Phosphatase: 61 U/L (ref 38–126)
Anion gap: 11 (ref 5–15)
BUN: 90 mg/dL — ABNORMAL HIGH (ref 8–23)
CO2: 20 mmol/L — ABNORMAL LOW (ref 22–32)
Calcium: 9 mg/dL (ref 8.9–10.3)
Chloride: 106 mmol/L (ref 98–111)
Creatinine, Ser: 6.31 mg/dL — ABNORMAL HIGH (ref 0.61–1.24)
GFR calc Af Amer: 9 mL/min — ABNORMAL LOW (ref >60)
GFR calc non Af Amer: 8 mL/min — ABNORMAL LOW (ref >60)
Glucose, Bld: 88 mg/dL (ref 70–99)
Potassium: 3.1 mmol/L — ABNORMAL LOW (ref 3.5–5.1)
Sodium: 137 mmol/L (ref 135–145)
Total Bilirubin: 0.4 mg/dL (ref 0.3–1.2)
Total Protein: 6.4 g/dL — ABNORMAL LOW (ref 6.5–8.1)

## 2020-01-23 LAB — CBC
HCT: 23.9 % — ABNORMAL LOW (ref 39.0–52.0)
Hemoglobin: 8 g/dL — ABNORMAL LOW (ref 13.0–17.0)
MCH: 31.3 pg (ref 26.0–34.0)
MCHC: 33.5 g/dL (ref 30.0–36.0)
MCV: 93.4 fL (ref 80.0–100.0)
Platelets: 215 10*3/uL (ref 150–400)
RBC: 2.56 MIL/uL — ABNORMAL LOW (ref 4.22–5.81)
RDW: 14.4 % (ref 11.5–15.5)
WBC: 5.3 10*3/uL (ref 4.0–10.5)
nRBC: 0 % (ref 0.0–0.2)

## 2020-01-23 LAB — MAGNESIUM: Magnesium: 1.9 mg/dL (ref 1.7–2.4)

## 2020-01-23 LAB — IRON AND TIBC
Iron: 94 ug/dL (ref 45–182)
Saturation Ratios: 40 % — ABNORMAL HIGH (ref 17.9–39.5)
TIBC: 235 ug/dL — ABNORMAL LOW (ref 250–450)
UIBC: 141 ug/dL

## 2020-01-23 MED ORDER — POTASSIUM CHLORIDE CRYS ER 20 MEQ PO TBCR
30.0000 meq | EXTENDED_RELEASE_TABLET | Freq: Three times a day (TID) | ORAL | Status: AC
  Administered 2020-01-23 (×2): 30 meq via ORAL
  Filled 2020-01-23 (×2): qty 1

## 2020-01-23 MED ORDER — HEPARIN SODIUM (PORCINE) 5000 UNIT/ML IJ SOLN
5000.0000 [IU] | Freq: Three times a day (TID) | INTRAMUSCULAR | Status: AC
  Administered 2020-01-23: 5000 [IU] via SUBCUTANEOUS
  Filled 2020-01-23: qty 1

## 2020-01-23 MED ORDER — METOPROLOL TARTRATE 25 MG PO TABS
12.5000 mg | ORAL_TABLET | Freq: Two times a day (BID) | ORAL | Status: DC
  Administered 2020-01-23 – 2020-01-26 (×6): 12.5 mg via ORAL
  Filled 2020-01-23 (×6): qty 1

## 2020-01-23 MED ORDER — CHLORHEXIDINE GLUCONATE CLOTH 2 % EX PADS
6.0000 | MEDICATED_PAD | Freq: Every day | CUTANEOUS | Status: DC
  Administered 2020-01-24 – 2020-01-27 (×3): 6 via TOPICAL

## 2020-01-23 NOTE — Progress Notes (Signed)
PROGRESS NOTE    AKAI DOLLARD  SLH:[id number] DOB: [birth year] DOA: 01/22/2020 PCP: Nolene Ebbs, MD    Brief Narrative:  74 y.o. male with history of multiple myeloma renal mass diastolic CHF hypertension anemia had been originally planned to follow-up with oncologist on June 1 week for potentially starting chemo for his multiple myeloma was found to be increasing weakness and had presented to the ER at [facility] yesterday.  Over the patient's creatinine is found to be around 6.7 increased from 5.62 weeks ago hemoglobin was stable around 8.4.  Potassium was 3.4.  Patient on exam did not look like patient being any fluid overload.  But due to worsening renal function patient also in addition complaining of poor appetite and uremic symptoms patient was transferred to [facility] at the request of patient and patient's family since patient was already seen by oncologist aware.  On my exam patient is not in distress.  Denies any chest pain or shortness of breath.  In the month of October 2020 patient was admitted for acute renal failure in the setting of multiple myeloma had undergone 3 plasmapheresis treatment at the time.  Patient states since then patient had not followed up with oncologist at [facility] and had proceeded to follow-up with [facility] oncologist again with nephrology follow-up.    Of note, pt did not desire further treatment for myeloma as of 11/20, however, now agreeable to full treatment, including plasmapheresis if indicated  Assessment & Plan:   Principal Problem:   ARF (acute renal failure) ([place]) Active Problems:   Essential (primary) hypertension   Paroxysmal atrial fibrillation (HCC)   Light chain myeloma (HCC)   CAD (coronary artery disease)   Chronic diastolic CHF (congestive heart failure) ([place])   Anemia   Ascending aortic aneurysm ([place])  1. Acute on chronic kidney disease with history of multiple myeloma previously requiring plasmapheresis in October  2020 1. Cr up to 6.5 and pt reporting slowing urine output recently 2. Patient agrees to full treatment, including plasmapheresis if indicated 3. Have consulted Nephrology 4. Avoid nephrotoxic agents 5. Pt agreeable to HD. Vein mapping planned with plans for permanent access while here. 2. Hypertension 1. BP stable at this time 2. Will continue on amlodipine as tolerated. 3. History of chronic diastolic CHF 1. Seems to be compensated at this time 4. History of multiple myeloma  1. Chart reviewed. Pt had previously followed with Dr. Irene Limbo 2. As of 11/20, pt had elected to stop all treatments 3. Pt now agreeable to treatment if indicated 4. Appreciate input by Oncology. Myeloma specific labs ordered per recs, pending 5. 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 Hemodynamically stable 2. Cont to follow CBC trends 6. Hx a fibrillation 1. Continued on tele for now 2. Remains stable at present 7. Ascending aortic aneurysm 1. Denied chest pains this AM 8. History of renal mass has not pursued further follow-up.   1. CT renal reviewed. Findings of 8.7cm L renal mass 2. Have discussed case with Urology. Pt is well known to [facility] and pt follows Dr. Louis Meckel. Per Urology, recommendation for close outpatient f/u when discharged. Of note, patient was recommended to undergo surgery in the fall of 2020, however pt was lost to follow up with patient stating, "I had too much going on" 3. Case was discussed with Dr. Jeffie Pollock  DVT prophylaxis: Heparin subq Code Status: Full Family Communication: Pt in room, family not at bedside  Status is: Inpatient  Remains inpatient appropriate because:Ongoing diagnostic testing needed not appropriate for outpatient work up and IV treatments appropriate due to intensity of illness or inability to take PO   Dispo: The patient is from: Home              Anticipated d/c is to: Home              Anticipated d/c date is: 3 days               Patient currently is not medically stable to d/c.        Consultants:   Nephrology  Oncology  Discussed case with Urology  Procedures:     Antimicrobials: Anti-infectives (From admission, onward)   None      Subjective: Complaining of some abd discomfort  Objective: Vitals:   01/22/20 2233 01/23/20 0333 01/23/20 0600 01/23/20 0818  BP: 133/85 137/87  (!) 125/96  Pulse: 66 78  67  Resp: 16   18  Temp: 98.3 F (36.8 C) 97.9 F (36.6 C)  98 F (36.7 C)  TempSrc: Oral Oral    SpO2: 98% 98%  97%  Weight:   86.1 kg   Height:        Intake/Output Summary (Last 24 hours) at 01/23/2020 1442 Last data filed at 01/23/2020 0900 Gross per 24 hour  Intake 360 ml  Output 1000 ml  Net -640 ml   Filed Weights   01/22/20 0406 01/23/20 0600  Weight: 86.8 kg 86.1 kg    Examination: General exam: Conversant, in no acute distress Respiratory system: normal chest rise, clear, no audible wheezing Cardiovascular system: regular rhythm, s1-s2 Gastrointestinal system: Nondistended, nontender, pos BS Central nervous system: No seizures, no tremors Extremities: No cyanosis, no joint deformities Skin: No rashes, no pallor Psychiatry: Affect normal // no auditory hallucinations   Data Reviewed: I have personally reviewed following labs and imaging studies  CBC: Recent Labs  Lab 01/22/20 0556 01/23/20 0322  WBC 5.1 5.3  NEUTROABS 3.4  --   HGB 8.2* 8.0*  HCT 24.9* 23.9*  MCV 94.7 93.4  PLT 213 778   Basic Metabolic Panel: Recent Labs  Lab 01/22/20 0556 01/23/20 0322  NA 139 137  K 3.3* 3.1*  CL 103 106  CO2 22 20*  GLUCOSE 86 88  BUN 90* 90*  CREATININE 6.51* 6.31*  CALCIUM 9.2 9.0  MG  --  1.9   GFR: Estimated Creatinine Clearance: 11 mL/min (A) (by C-G formula based on SCr of 6.31 mg/dL (H)). Liver Function Tests: Recent Labs  Lab 01/22/20 0556 01/23/20 0322  AST 16 12*  ALT 14 13  ALKPHOS 74 61  BILITOT 1.2 0.4  PROT 6.7 6.4*  ALBUMIN 3.4*  3.3*   No results for input(s): LIPASE, AMYLASE in the last 168 hours. No results for input(s): AMMONIA in the last 168 hours. Coagulation Profile: No results for input(s): INR, PROTIME in the last 168 hours. Cardiac Enzymes: Recent Labs  Lab 01/22/20 0556  CKTOTAL 382   BNP (last 3 results) No results for input(s): PROBNP in the last 8760 hours. HbA1C: No results for input(s): HGBA1C in the last 72 hours. CBG: No results for input(s): GLUCAP in the last 168 hours. Lipid Profile: No results for input(s): CHOL, HDL, LDLCALC, TRIG, CHOLHDL, LDLDIRECT in the last 72 hours. Thyroid Function Tests: Recent Labs    01/22/20 0556  TSH 3.093   Anemia Panel: Recent Labs    01/23/20 0322  TIBC 235*  IRON  94   Sepsis Labs: No results for input(s): PROCALCITON, LATICACIDVEN in the last 168 hours.  Recent Results (from the past 240 hour(s))  SARS Coronavirus 2 by RT PCR (hospital order, performed in [facility] hospital lab) Nasopharyngeal Nasopharyngeal Swab     Status: None   Collection Time: 01/22/20  6:01 AM   Specimen: Nasopharyngeal Swab  Result Value Ref Range Status   SARS Coronavirus 2 NEGATIVE NEGATIVE Final    Comment: (NOTE) SARS-CoV-2 target nucleic acids are NOT DETECTED. The SARS-CoV-2 RNA is generally detectable in upper and lower respiratory specimens during the acute phase of infection. The lowest concentration of SARS-CoV-2 viral copies this assay can detect is 250 copies / mL. A negative result does not preclude SARS-CoV-2 infection and should not be used as the sole basis for treatment or other patient management decisions.  A negative result may occur with improper specimen collection / handling, submission of specimen other than nasopharyngeal swab, presence of viral mutation(s) within the areas targeted by this assay, and inadequate number of viral copies (<250 copies / mL). A negative result must be combined with clinical observations, patient history,  and epidemiological information. Fact Sheet for Patients:   StrictlyIdeas.no Fact Sheet for Healthcare Providers: BankingDealers.co.za This test is not yet approved or cleared  by the [place] FDA and has been authorized for detection and/or diagnosis of SARS-CoV-2 by FDA under an Emergency Use Authorization (EUA).  This EUA will remain in effect (meaning this test can be used) for the duration of the COVID-19 declaration under Section 564(b)(1) of the Act, 21 U.S.C. section 360bbb-3(b)(1), unless the authorization is terminated or revoked sooner. Performed at [facility], [place] [address] [place], [place] [address]      Radiology Studies: DG CHEST PORT 1 VIEW  Result Date: 01/22/2020 CLINICAL DATA:  Shortness of breath EXAM: PORTABLE CHEST 1 VIEW COMPARISON:  11/30/2018 FINDINGS: Cardiomegaly and aortic tortuosity. There is no edema, consolidation, effusion, or pneumothorax. Hazy density at the right apex is chronic and attributed to ectatic vessels based on 2018 chest CT. IMPRESSION: Cardiomegaly without failure. Electronically Signed   By: Monte Fantasia M.D.   On: 01/22/2020 07:06   CT RENAL STONE STUDY  Result Date: 01/22/2020 CLINICAL DATA:  74 year old male with a history of flank pain EXAM: CT ABDOMEN AND PELVIS WITHOUT CONTRAST TECHNIQUE: Multidetector CT imaging of the abdomen and pelvis was performed following the standard protocol without IV contrast. COMPARISON:  Ultrasound 06/26/2019, CT 06/01/2019 FINDINGS: Lower chest: No acute finding of the lower chest. Hepatobiliary: Similar appearance of liver with small right-sided hypodense focus measuring 11 mm and additional small subcentimeter hypodense foci within the right liver. Gallbladder decompressed. Pancreas: Unremarkable Spleen: Unremarkable Adrenals/Urinary Tract: - Right adrenal gland:  Unremarkable - Left adrenal gland: Unremarkable. - Right kidney: Similar  appearance of the right kidney with multiple rounded lesions some of which are hyperdense and some of which are low-density. Complex lesion at the inferior renal cortex on the right estimated 4.7 cm x 4.5 cm, similar to the comparison. No evidence of hydronephrosis or nephrolithiasis. Unremarkable course of the right ureter. - Left Kidney: No evidence of left-sided hydronephrosis. Unremarkable course of the left ureter. Redemonstration of multiple low-density and high density lesions of left kidney. 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 The axial dimensions on the prior CT measured 7.2 cm  x 6.2 cm while the axial dimensions on the current CT are estimated 7.5 cm x 8.7 cm - Urinary Bladder: Urinary bladder is relatively unremarkable partially distended. Impression on the bladder base secondary to the median lobe of the prostate. Stomach/Bowel: - Stomach: Unremarkable. - Small bowel: Unremarkable - Appendix: Appendix is not visualized, however, no inflammatory changes are present adjacent to the cecum to indicate an appendicitis. - Colon: Surgical changes in the region of the cecum. Redemonstration of ventral hernia containing segment of transverse colon. No evidence of obstruction. Colonic diverticula. No inflammatory changes. No significant stool burden. Vascular/Lymphatic: Atherosclerotic calcifications of the abdominal aorta. Similar diameter of the infrarenal abdominal aorta with similar course caliber and contour. Greatest diameter measures 28 mm, similar to the comparison. Atherosclerotic changes of the bilateral iliac arteries. No lymphadenopathy. Reproductive: Enlarged prostate with the diameter on axial images measuring 6.7 cm x 8.2 cm. Impression on the bladder base from the median lobe. Other: Small umbilical hernia which again appears to have a knuckle of small bowel  entering the hernia sac. No evidence of associated obstruction. Supraumbilical ventral wall hernia again noted containing short segment of transverse colon. No significant inflammatory changes or evidence of obstruction. Bilateral fat containing inguinal hernia with trace fluid on the left. Musculoskeletal: No acute displaced fracture. Multilevel degenerative changes of the lumbar spine with multiple Schmorl's nodes. Vacuum disc phenomenon present at all levels except for L4-L5. No bony canal narrowing with posterior disc bulges at all levels. Degenerative changes of the bilateral hips. IMPRESSION: CT is negative for nephrolithiasis or hydronephrosis. No acute CT finding identified that would account for flank pain. 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 Urology referral is indicated if not previously completed. Redemonstration of additional bilateral renal lesions, incompletely characterized on the current CT. This includes a complex lesion at the inferior right kidney which may represent a partially cystic additional renal cell carcinoma. Again, urology referral is indicated. Unchanged appearance of a supraumbilical ventral hernia containing transverse colon. Unchanged appearance of umbilical hernia which contains short segment of small bowel, without obstruction. Prostatomegaly. Aortic Atherosclerosis (ICD10-I70.0). Unchanged diameter of the infrarenal abdominal aorta measuring 2.8 cm. Ectatic abdominal aorta at risk for aneurysm development. Recommend followup by ultrasound in 5 years. This recommendation follows ACR consensus guidelines: White Paper of the ACR Incidental Findings Committee II on Vascular Findings. J Am Coll Radiol 2013; 10:789-794. Additional ancillary findings as above. Electronically Signed   By: Corrie Mckusick D.O.   On: 01/22/2020 12:38    Scheduled  Meds: . amLODipine  10 mg Oral Daily  . atorvastatin  40 mg Oral Daily  . [START ON 01/24/2020] Chlorhexidine Gluconate Cloth  6 each Topical Q0600  . darbepoetin (ARANESP) injection - NON-DIALYSIS  60 mcg Subcutaneous Q Sun-1800  . docusate sodium  100 mg Oral Daily  . feeding supplement (ENSURE ENLIVE)  237 mL Oral BID BM  . heparin  5,000 Units Subcutaneous Q8H  . multivitamin  1 tablet Oral QHS  . potassium chloride  30 mEq Oral TID  . tamsulosin  0.4 mg Oral Daily   Continuous Infusions:   LOS: 1 day   Marylu Lund, MD [facility] Pager On Amion  If 7PM-7AM, please contact night-coverage 01/23/2020, 2:42 PM

## 2020-01-23 NOTE — Progress Notes (Addendum)
[facility] Progress Note  Subjective: doing well, no SOB or other c/o  Vitals:   01/22/20 2233 01/23/20 0333 01/23/20 0600 01/23/20 0818  BP: 133/85 137/87  (!) 125/96  Pulse: 66 78  67  Resp: 16   18  Temp: 98.3 F (36.8 C) 97.9 F (36.6 C)  98 F (36.7 C)  TempSrc: Oral Oral    SpO2: 98% 98%  97%  Weight:   86.1 kg   Height:        Exam: Gen alert, calm No jvd or bruits Chest clear bilat to bases RRR no RG, soft 1/6 sem Abd soft ntnd no mass or ascites +bs GU normal male MS no joint effusions or deformity Ext 1+ pretib edema Neuro is alert, Ox 3 , nf  Date                             Creat                           eGFR 2015- 18                      0.80- 1.10 Sept-Dec 2019            5.99 >> 2.10                Severe AKI Feb- mar 2020            1.5- 1.7                        45- 50 Oct 2020                     6.11 >> 5.29                AKI episode Nov                              3.7- 4.09 Aug 2019                     4.10                             15- 17 Jan                              3.82                             17 Feb                              4.41 April                             4.9- 5.10                      12 Jan 08 2020  5.63                             11 Jan 22 2020               6.51                             9   Home meds:  - norvasc 10/ lipitor 40  - norco prn/ flomax 0.4 qd  - prn's/ vitamins/ supplements  UA 5/22 - negative, prot 30   Assessment/ Plan: 1. 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  Pt wants to proceed with dialysis (in the past was not sure). He has uremic symptoms. Will ask IR to see for [facility] Monday.  HD ordered for after access established. Ordered vein mapping. Will need perm access as well prior to dc.  2. Multiple myeloma - f/b ONC 3. 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 BP/ vol - cont norvasc, mild LE edema on exam otherwise euvolemic.  5. BPH on flomax 6. Anemia ckd +/- malignancy - Hb 8.2 , tsat ok at 40%,  started darbe 60 /wk      [name] [name] 01/23/2020, 1:17 PM   Recent Labs  Lab 01/22/20 0556 01/23/20 0322  K 3.3* 3.1*  BUN 90* 90*  CREATININE 6.51* 6.31*  CALCIUM 9.2 9.0  HGB 8.2* 8.0*   Inpatient medications: . amLODipine  10 mg Oral Daily  . atorvastatin  40 mg Oral Daily  . darbepoetin (ARANESP) injection - NON-DIALYSIS  60 mcg Subcutaneous Q Sun-1800  . docusate sodium  100 mg Oral Daily  . feeding supplement (ENSURE ENLIVE)  237 mL Oral BID BM  . heparin  5,000 Units Subcutaneous Q8H  . multivitamin  1 tablet Oral QHS  . tamsulosin  0.4 mg Oral Daily    ondansetron **OR** ondansetron (ZOFRAN) IV, oxyCODONE

## 2020-01-23 NOTE — Progress Notes (Signed)
Upper extremity vein mapping order received. Patient had UEVM 06/2019. Per order, [facility]VS is to be consulted. Please advise if vein mapping is still needed.  01/23/2020 9:59 AM Kelby Aline., MHA, RVT, RDCS, RDMS

## 2020-01-23 NOTE — Plan of Care (Signed)

## 2020-01-24 ENCOUNTER — Inpatient Hospital Stay (HOSPITAL_COMMUNITY): Payer: Medicare Other

## 2020-01-24 HISTORY — PX: IR FLUORO GUIDE CV LINE RIGHT: IMG2283

## 2020-01-24 HISTORY — PX: IR US GUIDE VASC ACCESS RIGHT: IMG2390

## 2020-01-24 LAB — CBC
HCT: 24.9 % — ABNORMAL LOW (ref 39.0–52.0)
Hemoglobin: 8.3 g/dL — ABNORMAL LOW (ref 13.0–17.0)
MCH: 31.4 pg (ref 26.0–34.0)
MCHC: 33.3 g/dL (ref 30.0–36.0)
MCV: 94.3 fL (ref 80.0–100.0)
Platelets: 225 10*3/uL (ref 150–400)
RBC: 2.64 MIL/uL — ABNORMAL LOW (ref 4.22–5.81)
RDW: 14.6 % (ref 11.5–15.5)
WBC: 5.8 10*3/uL (ref 4.0–10.5)
nRBC: 0 % (ref 0.0–0.2)

## 2020-01-24 LAB — BASIC METABOLIC PANEL
Anion gap: 15 (ref 5–15)
BUN: 95 mg/dL — ABNORMAL HIGH (ref 8–23)
CO2: 22 mmol/L (ref 22–32)
Calcium: 9.4 mg/dL (ref 8.9–10.3)
Chloride: 101 mmol/L (ref 98–111)
Creatinine, Ser: 6.18 mg/dL — ABNORMAL HIGH (ref 0.61–1.24)
GFR calc Af Amer: 9 mL/min — ABNORMAL LOW (ref >60)
GFR calc non Af Amer: 8 mL/min — ABNORMAL LOW (ref >60)
Glucose, Bld: 109 mg/dL — ABNORMAL HIGH (ref 70–99)
Potassium: 4.1 mmol/L (ref 3.5–5.1)
Sodium: 138 mmol/L (ref 135–145)

## 2020-01-24 LAB — GLUCOSE, CAPILLARY: Glucose-Capillary: 73 mg/dL (ref 70–99)

## 2020-01-24 LAB — KAPPA/LAMBDA LIGHT CHAINS
Kappa, lambda light chain ratio: 1251.89 — ABNORMAL HIGH (ref 0.26–1.65)
Lambda free light chains: 14.1 mg/L (ref 5.7–26.3)

## 2020-01-24 MED ORDER — HEPARIN SODIUM (PORCINE) 1000 UNIT/ML IJ SOLN
INTRAMUSCULAR | Status: AC
  Filled 2020-01-24: qty 1

## 2020-01-24 MED ORDER — MIDAZOLAM HCL 2 MG/2ML IJ SOLN
INTRAMUSCULAR | Status: AC
  Filled 2020-01-24: qty 2

## 2020-01-24 MED ORDER — FENTANYL CITRATE (PF) 100 MCG/2ML IJ SOLN
INTRAMUSCULAR | Status: AC
  Filled 2020-01-24: qty 2

## 2020-01-24 MED ORDER — VANCOMYCIN HCL IN DEXTROSE 1-5 GM/200ML-% IV SOLN
INTRAVENOUS | Status: AC
  Administered 2020-01-24: 1000 mg
  Filled 2020-01-24: qty 200

## 2020-01-24 MED ORDER — HEPARIN SODIUM (PORCINE) 1000 UNIT/ML DIALYSIS
1500.0000 [IU] | INTRAMUSCULAR | Status: DC | PRN
  Administered 2020-01-25: 1500 [IU] via INTRAVENOUS_CENTRAL

## 2020-01-24 MED ORDER — FENTANYL CITRATE (PF) 100 MCG/2ML IJ SOLN
INTRAMUSCULAR | Status: AC | PRN
  Administered 2020-01-24: 25 ug via INTRAVENOUS

## 2020-01-24 MED ORDER — LIDOCAINE HCL 1 % IJ SOLN
INTRAMUSCULAR | Status: AC
  Filled 2020-01-24: qty 20

## 2020-01-24 MED ORDER — HEPARIN SODIUM (PORCINE) 1000 UNIT/ML IJ SOLN
INTRAMUSCULAR | Status: AC | PRN
  Administered 2020-01-24: 3.8 mL via INTRAVENOUS

## 2020-01-24 MED ORDER — DEXTROSE-NACL 5-0.9 % IV SOLN: INTRAVENOUS | Status: DC

## 2020-01-24 MED ORDER — LIDOCAINE HCL 1 % IJ SOLN
INTRAMUSCULAR | Status: AC | PRN
  Administered 2020-01-24: 10 mL

## 2020-01-24 MED ORDER — GELATIN ABSORBABLE 12-7 MM EX MISC
CUTANEOUS | Status: AC
  Filled 2020-01-24: qty 1

## 2020-01-24 MED ORDER — MIDAZOLAM HCL 2 MG/2ML IJ SOLN
INTRAMUSCULAR | Status: AC | PRN
  Administered 2020-01-24: 1 mg via INTRAVENOUS

## 2020-01-24 NOTE — Consult Note (Signed)
Chief Complaint: Patient was seen in consultation today for renal failure  Referring Physician(s): * No referring provider recorded for this case *  Supervising Physician: Corrie Mckusick  Patient Status: [facility] - In-pt  History of Present Illness: [name] [name] is a 74 y.o. male with past medical history of CAD, CHF, HLD, HTN, multiple myeloma, paroxysmal a fib, renal cell carcinoma who presented to OSH with profound weakness. He was found to have SCr of 6.7 with hypokalemia and referred to [facility] for management of his renal failure.  He has been assessed by Nephrology and is in need of dialysis access for anticipated initiation of dialysis. He has been NPO today. 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 He is agreeable to dialysis catheter placement.   Past Medical History:  Diagnosis Date  . Abnormal CT of liver    a. nodular contour suggesting cirrhosis 05/2018.  [name] Abnormal LFTs (liver function tests)   . Anxiety   . Aortic root dilatation ([place])   . Ascending aortic aneurysm ([place])   . BPH with urinary obstruction   . Bradycardia   . 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 CAD (coronary artery disease)    a. 12/2015 NSTEMI/Cath (in setting of PAF):  LM nl, LAD 30p,  LCX 54m RCA  ok, AM 100%, RPDA1 40, RPDA 2 60 ->Med Rx.  . Childhood asthma   . Chronic diastolic CHF (congestive heart failure) ([place]    a. 12/2015 Echo: EF 50-55%, mod AI, mod Ao root dil, mild MR, mod dil LA, mod RA.  . CKD (chronic kidney disease), stage III   . Colon cancer ([place]   . COPD (chronic obstructive pulmonary disease) ([place]    pt denies at preop  . Diabetes mellitus type II    diet controlled  . History of syncope   . Hyperlipidemia   . Hypertensive heart disease   . Kidney lump 04/04/2010   Overview:   Renal Cell Carcinoma   . 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 Moderate aortic insufficiency    a. 12/2015 Echo: Mod AI.  .Marland KitchenParoxysmal atrial fibrillation ([place]    a. 12/2015 started on Xarelto (CHA2DS2VASc = 4-5).  . Pneumonia   . Prostate cancer ([place]   . Renal cell carcinoma ([place]   . Sleep apnea    Does not like  CPAP  . Spinal stenosis in cervical region 10/12/2016  . Venous insufficiency     Past Surgical History:  Procedure Laterality Date  . BACK SURGERY    . CARDIAC CATHETERIZATION N/A 12/18/2015   Procedure: Left Heart Cath and Coronary Angiography;  Surgeon: DLeonie Man MD;  [facility];  Service: Cardiovascular;  Laterality: N/A;  . DIAGNOSTIC LAPAROSCOPY     partial colectomy  . IR FLUORO GUIDE CV LINE RIGHT  06/28/2019  . IR REMOVAL TUN CV CATH W/O FL  07/26/2019  . IR [place] VASC ACCESS RIGHT  06/28/2019  . LAPAROSCOPIC PARTIAL COLECTOMY Right 05/21/2018   Procedure: LAPAROSCOPIC RIGHT  COLECTOMY ERAS PATHWAY;  Surgeon: TLeighton Ruff MD;  Location: [facility];  Service: General;  Laterality: Right;  . [place]  "replaced a disc"    Allergies: Amoxicillin and Penicillins  Medications: Prior to Admission medications  Medication Sig Start Date End Date Taking? Authorizing Provider  amLODipine (NORVASC) 10 MG tablet Take 1 tablet (10 mg total) by mouth daily. 01/12/20  Yes [name], DO  atorvastatin (LIPITOR) 40 MG tablet Take 40 mg by mouth daily.   Yes [provider]  cetirizine (ZYRTEC) 10 MG tablet TAKE 1 TABLET (10 MG TOTAL) BY MOUTH DAILY AS NEEDED (SEASONAL ALLERGIES). Patient taking differently: Take 10 mg by mouth daily as needed for allergies (seasonal allergies).  11/12/19  Yes [name], DO  Cholecalciferol (VITAMIN D3 PO) Take 1 tablet by mouth daily.   Yes [provider]  Cyanocobalamin (VITAMIN B-12 PO) Take 1 tablet by mouth daily.   Yes [provider]    HYDROcodone-acetaminophen (NORCO/VICODIN) 5-325 MG tablet Take 1-2 tablets by mouth every 6 (six) hours as needed (gout pain). 12/29/19  Yes Bast, Traci A, NP  Omega-3 Fatty Acids (FISH OIL PO) Take 1 capsule by mouth daily.    Yes [provider]  tamsulosin (FLOMAX) 0.4 MG CAPS capsule Take 1 capsule (0.4 mg total) by mouth daily. 10/15/19  Yes [name], DO  glucose blood (ONETOUCH ULTRA) test strip Check FSBS twice a day. Dx: E11.21, N18.32 08/16/19   Antony Blackbird, MD     Family History  Problem Relation Age of Onset  . Emphysema Father   . Asthma Father   . Liver disease Father        tumor  . Heart disease Mother   . Hypertension Mother   . Asthma Sister   . Hypertension Son     Social History   Socioeconomic History  . Marital status: Widowed    Spouse name: Not on file  . Number of children: Not on file  . Years of education: Not on file  . Highest education level: Not on file  Occupational History  . Not on file  Tobacco Use  . Smoking status: Former Smoker    Types: Cigars    Quit date: 09/02/1977    Years since quitting: 42.4  . Smokeless tobacco: Never Used  . Tobacco comment:    Substance and Sexual Activity  . Alcohol use: Not Currently    Comment: "stopped drinking alcohol in ~ 1980; just drank a little on the weekends when I did drink"  . Drug use: No  . Sexual activity: Not Currently  Other Topics Concern  . Not on file  Social History Narrative   Lives alone.  Wife died in 04-14-2023.  Lives in apartment.     Social Determinants of Health   Financial Resource Strain:   . Difficulty of Paying Living Expenses:   Food Insecurity:   . Worried About Charity fundraiser in the Last Year:   . Arboriculturist in the Last Year:   Transportation Needs:   . Film/video editor (Medical):   [name] Lack of Transportation (Non-Medical):   Physical Activity:   . Days of Exercise per Week:   . Minutes of Exercise per Session:   Stress:   .  Feeling of Stress :   Social Connections:   . Frequency of Communication with Friends and Family:   . Frequency of Social Gatherings with Friends and Family:   . Attends Religious Services:   . Active Member of Clubs or Organizations:   . Attends Archivist Meetings:   [name] Marital Status:      Review of Systems: A 12 point ROS discussed and  pertinent positives are indicated in the HPI above.  All other systems are negative.  Review of Systems  Constitutional: Positive for fatigue. Negative for fever.  Respiratory: Negative for cough and shortness of breath.   Cardiovascular: Negative for chest pain.  Gastrointestinal: Negative for abdominal pain, nausea and vomiting.  Genitourinary: Negative for dysuria.  Musculoskeletal: Negative for back pain.  Neurological: Positive for weakness.  Psychiatric/Behavioral: Negative for behavioral problems and confusion.    Vital Signs: BP 128/85 (BP Location: Left Arm)   Pulse 67   Temp 98.8 F (37.1 C) (Oral)   Resp 14   Ht 5' 9"  (1.753 m)   Wt 188 lb 15 oz (85.7 kg)   SpO2 100%   BMI 27.90 kg/m   Physical Exam Vitals and nursing note reviewed.  Constitutional:      General: He is not in acute distress.    Appearance: Normal appearance.  HENT:     Mouth/Throat:     Mouth: Mucous membranes are moist.     Pharynx: Oropharynx is clear.  Cardiovascular:     Rate and Rhythm: Normal rate and regular rhythm.  Pulmonary:     Effort: Pulmonary effort is normal. No respiratory distress.     Breath sounds: Normal breath sounds.  Musculoskeletal:        General: Normal range of motion.     Cervical back: Normal range of motion and neck supple.  Skin:    General: Skin is warm and dry.  Neurological:     General: No focal deficit present.     Mental Status: He is alert and oriented to person, place, and time. Mental status is at baseline.  Psychiatric:        Mood and Affect: Mood normal.        Behavior: Behavior normal.         Thought Content: Thought content normal.        Judgment: Judgment normal.      MD Evaluation Airway: WNL Heart: WNL Abdomen: WNL Chest/ Lungs: WNL ASA  Classification: 3 Mallampati/Airway Score: One   Imaging: DG CHEST PORT 1 VIEW  Result Date: 01/22/2020 CLINICAL DATA:  Shortness of breath EXAM: PORTABLE CHEST 1 VIEW COMPARISON:  11/30/2018 FINDINGS: Cardiomegaly and aortic tortuosity. There is no edema, consolidation, effusion, or pneumothorax. Hazy density at the right apex is chronic and attributed to ectatic vessels based on 2018 chest CT. IMPRESSION: Cardiomegaly without failure. Electronically Signed   By: Monte Fantasia M.D.   On: 01/22/2020 07:06   CT RENAL STONE STUDY  Result Date: 01/22/2020 CLINICAL DATA:  74 year old male with a history of flank pain EXAM: CT ABDOMEN AND PELVIS WITHOUT CONTRAST TECHNIQUE: Multidetector CT imaging of the abdomen and pelvis was performed following the standard protocol without IV contrast. COMPARISON:  Ultrasound 06/26/2019, CT 06/01/2019 FINDINGS: Lower chest: No acute finding of the lower chest. Hepatobiliary: Similar appearance of liver with small right-sided hypodense focus measuring 11 mm and additional small subcentimeter hypodense foci within the right liver. Gallbladder decompressed. Pancreas: Unremarkable Spleen: Unremarkable Adrenals/Urinary Tract: - Right adrenal gland:  Unremarkable - Left adrenal gland: Unremarkable. - Right kidney: Similar appearance of the right kidney with multiple rounded lesions some of which are hyperdense and some of which are low-density. Complex lesion at the inferior renal cortex on the right estimated 4.7 cm x 4.5 cm, similar to the comparison. No evidence of hydronephrosis or nephrolithiasis. Unremarkable course of the right ureter. - Left Kidney: No evidence of left-sided hydronephrosis.  Unremarkable course of the left ureter. Redemonstration of multiple low-density and high density lesions of left  kidney. 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 The axial dimensions on the prior CT measured 7.2 cm x 6.2 cm while the axial dimensions on the current CT are estimated 7.5 cm x 8.7 cm - Urinary Bladder: Urinary bladder is relatively unremarkable partially distended. Impression on the bladder base secondary to the median lobe of the prostate. Stomach/Bowel: - Stomach: Unremarkable. - Small bowel: Unremarkable - Appendix: Appendix is not visualized, however, no inflammatory changes are present adjacent to the cecum to indicate an appendicitis. - Colon: Surgical changes in the region of the cecum. Redemonstration of ventral hernia containing segment of transverse colon. No evidence of obstruction. Colonic diverticula. No inflammatory changes. No significant stool burden. Vascular/Lymphatic: Atherosclerotic calcifications of the abdominal aorta. Similar diameter of the infrarenal abdominal aorta with similar course caliber and contour. Greatest diameter measures 28 mm, similar to the comparison. Atherosclerotic changes of the bilateral iliac arteries. No lymphadenopathy. Reproductive: Enlarged prostate with the diameter on axial images measuring 6.7 cm x 8.2 cm. Impression on the bladder base from the median lobe. Other: Small umbilical hernia which again appears to have a knuckle of small bowel entering the hernia sac. No evidence of associated obstruction. Supraumbilical ventral wall hernia again noted containing short segment of transverse colon. No significant inflammatory changes or evidence of obstruction. Bilateral fat containing inguinal hernia with trace fluid on the left. Musculoskeletal: No acute displaced fracture. Multilevel degenerative changes of the lumbar spine with multiple Schmorl's nodes. Vacuum disc phenomenon present at all levels except for L4-L5. No bony canal  narrowing with posterior disc bulges at all levels. Degenerative changes of the bilateral hips. IMPRESSION: CT is negative for nephrolithiasis or hydronephrosis. No acute CT finding identified that would account for flank pain. 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 Urology referral is indicated if not previously completed. Redemonstration of additional bilateral renal lesions, incompletely characterized on the current CT. This includes a complex lesion at the inferior right kidney which may represent a partially cystic additional renal cell carcinoma. Again, urology referral is indicated. Unchanged appearance of a supraumbilical ventral hernia containing transverse colon. Unchanged appearance of umbilical hernia which contains short segment of small bowel, without obstruction. Prostatomegaly. Aortic Atherosclerosis (ICD10-I70.0). Unchanged diameter of the infrarenal abdominal aorta measuring 2.8 cm. Ectatic abdominal aorta at risk for aneurysm development. Recommend followup by ultrasound in 5 years. This recommendation follows ACR consensus guidelines: White Paper of the ACR Incidental Findings Committee II on Vascular Findings. J Am Coll Radiol 2013; 10:789-794. Additional ancillary findings as above. Electronically Signed   By: Corrie Mckusick D.O.   On: 01/22/2020 12:38    Labs:  CBC: Recent Labs    01/12/20 1408 01/22/20 0556 01/23/20 0322 01/24/20 0211  WBC 5.2 5.1 5.3 5.8  HGB 9.0* 8.2* 8.0* 8.3*  HCT 27.2* 24.9* 23.9* 24.9*  PLT 220 213 215 225    COAGS: Recent Labs    06/04/19 0445 06/28/19 0535 12/24/19 0846  INR 0.8 0.9 1.0    BMP: Recent Labs    01/08/20 1513 01/22/20 0556 01/23/20 0322 01/24/20 0211  NA 135 139 137 138  K 3.1* 3.3* 3.1* 4.1  CL  101 103 106 101  CO2 24 22 20* 22  GLUCOSE 152* 86 88 109*  BUN 73* 90* 90* 95*    CALCIUM 9.0 9.2 9.0 9.4  CREATININE 5.63* 6.51* 6.31* 6.18*  GFRNONAA 9* 8* 8* 8*  GFRAA 11* 9* 9* 9*    LIVER FUNCTION TESTS: Recent Labs    12/23/19 1544 12/24/19 0846 01/22/20 0556 01/23/20 0322  BILITOT 0.4 0.4 1.2 0.4  AST 16 14* 16 12*  ALT 12 12 14 13   ALKPHOS 72 73 74 61  PROT 6.6 6.8 6.7 6.4*  ALBUMIN 3.6 3.6 3.4* 3.3*    TUMOR MARKERS: No results for input(s): AFPTM, CEA, CA199, CHROMGRNA in the last 8760 hours.  Assessment and Plan: Renal failure Patient in need of dialysis access for intiation of HD.  He has been NPO today.  WBC 5.8 Afebrile.  Plans to start dialysis after access obtained.   Risks and benefits discussed with the patient including, but not limited to bleeding, infection, vascular injury, pneumothorax which may require chest tube placement, air embolism or even death  All of the patient's questions were answered, patient is agreeable to proceed. Consent signed and in chart.  Thank you for this interesting consult.  I greatly enjoyed meeting [name] [name] and look forward to participating in their care.  A copy of this report was sent to the requesting provider on this date.  Electronically Signed: Docia Barrier, PA 01/24/2020, 11:29 AM   I spent a total of 40 Minutes    in face to face in clinical consultation, greater than 50% of which was counseling/coordinating care for renal failure.

## 2020-01-24 NOTE — Procedures (Signed)
Interventional Radiology Procedure Note  Procedure: Placement of a right IJ approach 23cm tip to cuff HD catheter.  Tip is positioned at the superior cavoatrial junction and catheter is ready for immediate use.  Complications: None Recommendations:  - Ok to use - Do not submerge - Routine care   Signed,  Dulcy Fanny. Earleen Newport, DO

## 2020-01-24 NOTE — Progress Notes (Addendum)
[facility] Progress Note  Subjective: doing well, no complaints except NPO for procedure  Vitals:   01/23/20 1902 01/24/20 0314 01/24/20 0700 01/24/20 0900  BP: 138/88 123/83  128/85  Pulse: 72 (!) 58  67  Resp: 17 14    Temp: 98.4 F (36.9 C) 98.6 F (37 C)  98.8 F (37.1 C)  TempSrc: Oral Oral  Oral  SpO2: 100% 100%  100%  Weight:   85.7 kg   Height:        Exam: Gen alert, calm  No jvd or bruits Chest clear bilat to bases RRR no RG, soft 1/6 sem Abd soft ntnd no mass or ascites +bs GU normal male MS no joint effusions or deformity Ext 1+ pretib edema Neuro is alert, Ox 3 , nf  Date                             Creat                           eGFR 2015- 18                      0.80- 1.10 Sept-Dec 2019            5.99 >> 2.10                Severe AKI Feb- mar 2020            1.5- 1.7                        45- 50 Oct 2020                     6.11 >> 5.29                AKI episode Nov                              3.7- 4.09 Aug 2019                     4.10                             15- 17 Jan                              3.82                             17 Feb                              4.41 April                             4.9- 5.10                      12 Jan 08 2020  5.63                             11 Jan 22 2020               6.51                             9   Home meds:  - norvasc 10/ lipitor 40  - norco prn/ flomax 0.4 qd  - prn's/ vitamins/ supplements  UA 5/22 - negative, prot 30   Assessment/ Plan: 1. 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  Presumed due to multiple myeloma most likely.  Pt wants to proceed with dialysis (in the past was not sure). He has uremic symptoms. TDC requested from [place] today.  HD ordered for after access established. Ordered vein mapping. Will need perm access as well prior to dc. Plan consult [facility] in AM.  CLIP  initiated. 2. Multiple myeloma - f/b ONC 3. L renal mass - seen by urology in Oct 2020, suspected RCC, outpatient f/u.  4. BP/ vol - cont norvasc, mild LE edema on exam otherwise euvolemic.  5. BPH on flomax 6. Anemia ckd +/- malignancy - Hb 8.2 , tsat ok at 40%,  Hold on ESA in light of likely RCC until clarify further.      Jannifer Hick MD [facility] Pager [phone]   Recent Labs  Lab 01/23/20 0322 01/24/20 0211  K 3.1* 4.1  BUN 90* 95*  CREATININE 6.31* 6.18*  CALCIUM 9.0 9.4  HGB 8.0* 8.3*   Inpatient medications: . amLODipine  10 mg Oral Daily  . atorvastatin  40 mg Oral Daily  . Chlorhexidine Gluconate Cloth  6 each Topical Q0600  . darbepoetin (ARANESP) injection - NON-DIALYSIS  60 mcg Subcutaneous Q Sun-1800  . docusate sodium  100 mg Oral Daily  . feeding supplement (ENSURE ENLIVE)  237 mL Oral BID BM  . metoprolol tartrate  12.5 mg Oral BID  . multivitamin  1 tablet Oral QHS  . tamsulosin  0.4 mg Oral Daily    [START ON 01/25/2020] heparin, ondansetron **OR** ondansetron (ZOFRAN) IV, oxyCODONE

## 2020-01-24 NOTE — Sedation Documentation (Signed)
Procedure finished. 99mcg fentanyl 1mg  versed 1gm vancomycin Total time: 14 mins

## 2020-01-24 NOTE — Progress Notes (Signed)
Oncology short note  Patient is known to oncology service from previously. Appreciate excellent note by Dr Lorenso Courier.  Patient has aborted treatment on 2 previous occassions for his Multiple Myeloma and had chosen to not be treated. He has also failed to /choosen not to undergo surgery for his suspected RCC which is also progressing. He has reached ESRD status on account of his myeloma and RCC. PLAN -acute stabilization of renal function with HD -plz consult palliative care for goals of care discussions. -urology input regarding possibility and timing of possible surgery for likely RCC. -patient is a poor candidate for systemic myeloma treatment at this time due to difficulty with performance status and non compliance. - if patient is agreeable/tolerant with ongoing HD and is agreeable with and has a clear plan to address RCC then we could consider restarting systemic myeloma treatment as outpatient with demonstrated patient compliance. -if patient declined surgery for RCC -- would recommend hospice. -will setup patient for outpatient medical oncology f/u if discharged soon.  -will f/u later in the week if patient still in the hospital.  Sullivan Lone MD MS

## 2020-01-24 NOTE — Sedation Documentation (Signed)
Patient is resting comfortably. 

## 2020-01-24 NOTE — Progress Notes (Signed)
Renal Navigator notes patient's need for OP HD referral for treatment for ESRD. Navigator met with patient at bedside. His granddaughter was visiting and he gave permission to speak openly with her present. In regards to starting dialysis, he states, "yeah I'm gonna do it. 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 His wife passed away about 5 years ago. He states he has great family support, though it appears he does not like to ask for help, as he says, "they all work," and his granddaughter tells me that everyone is willing to help. Granddaughter present today is [place]'s daughter. She states her father works here at [employer] and lives about 15 minutes away from her grandfather. Patient lives alone, states he walks well (patient got up and bounced himself a little to show how strong he is). He thinks he needs a cook and a house cleaner now that he is starting HD. Navigator told him this is something he and his family will have to arrange. Navigator stated that patient are set up with [facility] when it is recommended and ordered by a physician. 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 Navigator recommends having family transport him to his first few treatments until he has adjusted, but he does not feel this will be necessary. Patient's granddaughter agrees and says family will provide transportation. Patient is very pleasant and talkative. He is familiar with multiple clinics from living in various parts of town while his wife was receiving care. Navigator explained that we will set him up at the clinic closest to his home, which is [place]. He agreed.  Referral submitted to [facility] to request treatment at [facility]. Navigator will follow closely.  Alphonzo Cruise, [place] Renal Navigator [phone]

## 2020-01-24 NOTE — Progress Notes (Signed)
PROGRESS NOTE    [name] [name]  ZOX:[id number] DOB: [birth year] DOA: 01/22/2020 PCP: [name] Ebbs, MD    Brief Narrative:  74 y.o. male with history of multiple myeloma renal mass diastolic CHF hypertension anemia had been originally planned to follow-up with oncologist on June 1 week for potentially starting chemo for his multiple myeloma was found to be increasing weakness and had presented to the ER at [facility] yesterday.  Over the patient's creatinine is found to be around 6.7 increased from 5.62 weeks ago hemoglobin was stable around 8.4.  Potassium was 3.4.  Patient on exam did not look like patient being any fluid overload.  But due to worsening renal function patient also in addition complaining of poor appetite and uremic symptoms patient was transferred to [facility] at the request of patient and patient's family since patient was already seen by oncologist aware.  On my exam patient is not in distress.  Denies any chest pain or shortness of breath.  In the month of October 2020 patient was admitted for acute renal failure in the setting of multiple myeloma had undergone 3 plasmapheresis treatment at the time.  Patient states since then patient had not followed up with oncologist at [facility] and had proceeded to follow-up with [facility] oncologist again with nephrology follow-up.    Of note, pt did not desire further treatment for myeloma as of 11/20, however, now agreeable to full treatment, including plasmapheresis if indicated  Assessment & Plan:   Principal Problem:   ARF (acute renal failure) ([place]) Active Problems:   Essential (primary) hypertension   Paroxysmal atrial fibrillation (HCC)   Light chain myeloma (HCC)   CAD (coronary artery disease)   Chronic diastolic CHF (congestive heart failure) ([place])   Anemia   Ascending aortic aneurysm ([place])  1. Acute on chronic kidney disease with history of multiple myeloma previously requiring plasmapheresis in October  2020 1. Cr up to 6.5 and pt reporting slowing urine output recently 2. Patient agrees to full treatment, including plasmapheresis if indicated 3. Nephrology is following 4. Avoid nephrotoxic agents 5. Pt agreeable to HD. IR consulted for HD cath placement today, then start HD afterwards 2. Hypertension 1. BP remains stable at this time 2. Will continue on amlodipine as tolerated. 3. History of chronic diastolic CHF 1. Seems to be compensated currently 4. History of multiple myeloma  1. Chart reviewed. Pt had previously followed with [name] [name] 2. As of 11/20, pt had elected to stop all treatments 3. Pt now agreeable to treatment if indicated 4. Appreciate input by Oncology. Myeloma specific labs ordered per recs, remain pending 5. 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 Hemodynamically stable 2. Will cont to follow CBC trends 6. Hx a fibrillation 1. Continued on tele for now 2. Remains stable at present 7. Ascending aortic aneurysm 1. Denied chest pains this AM 8. History of renal mass has not pursued further follow-up.   1. CT renal reviewed. Findings of 8.7cm L renal mass 2. Have discussed case with Urology. Pt is well known to [facility] and pt follows Dr. Louis [name]. Of note, patient was recommended to undergo surgery in the fall of 2020, however pt was lost to follow up with patient stating, "I had too much going on" 3. Case was earlier discussed with Dr. Jeffie [name] who recommends close outpt follow up  DVT prophylaxis: Heparin subq Code Status: Full Family Communication: Pt in room, family not at bedside  Status is: Inpatient  Remains inpatient appropriate because:Ongoing diagnostic testing needed not appropriate for outpatient work up and IV treatments appropriate due to intensity of illness or inability to take PO   Dispo: The patient is from: Home              Anticipated d/c is to: Home              Anticipated d/c date is: 3 days              Patient  currently is not medically stable to d/c.   Consultants:   Nephrology  Oncology  Discussed case with Urology  Procedures:     Antimicrobials: Anti-infectives (From admission, onward)   None      Subjective: Has been NPO for procedure this AM. Eager to eat soon  Objective: Vitals:   01/23/20 1902 01/24/20 0314 01/24/20 0700 01/24/20 0900  BP: 138/88 123/83  128/85  Pulse: 72 (!) 58  67  Resp: 17 14    Temp: 98.4 F (36.9 C) 98.6 F (37 C)  98.8 F (37.1 C)  TempSrc: Oral Oral  Oral  SpO2: 100% 100%  100%  Weight:   85.7 kg   Height:        Intake/Output Summary (Last 24 hours) at 01/24/2020 1518 Last data filed at 01/23/2020 2312 Gross per 24 hour  Intake 360 ml  Output --  Net 360 ml   Filed Weights   01/22/20 0406 01/23/20 0600 01/24/20 0700  Weight: 86.8 kg 86.1 kg 85.7 kg    Examination: General exam: Conversant, in no acute distress Respiratory system: normal chest rise, clear, no audible wheezing Cardiovascular system: regular rhythm, s1-s2 Gastrointestinal system: Nondistended, nontender, pos BS Central nervous system: No seizures, no tremors Extremities: No cyanosis, no joint deformities Skin: No rashes, no pallor Psychiatry: Affect normal // no auditory hallucinations   Data Reviewed: I have personally reviewed following labs and imaging studies  CBC: Recent Labs  Lab 01/22/20 0556 01/23/20 0322 01/24/20 0211  WBC 5.1 5.3 5.8  NEUTROABS 3.4  --   --   HGB 8.2* 8.0* 8.3*  HCT 24.9* 23.9* 24.9*  MCV 94.7 93.4 94.3  PLT 213 215 790   Basic Metabolic Panel: Recent Labs  Lab 01/22/20 0556 01/23/20 0322 01/24/20 0211  NA 139 137 138  K 3.3* 3.1* 4.1  CL 103 106 101  CO2 22 20* 22  GLUCOSE 86 88 109*  BUN 90* 90* 95*  CREATININE 6.51* 6.31* 6.18*  CALCIUM 9.2 9.0 9.4  MG  --  1.9  --    GFR: Estimated Creatinine Clearance: 11.2 mL/min (A) (by C-G formula based on SCr of 6.18 mg/dL (H)). Liver Function Tests: Recent Labs   Lab 01/22/20 0556 01/23/20 0322  AST 16 12*  ALT 14 13  ALKPHOS 74 61  BILITOT 1.2 0.4  PROT 6.7 6.4*  ALBUMIN 3.4* 3.3*   No results for input(s): LIPASE, AMYLASE in the last 168 hours. No results for input(s): AMMONIA in the last 168 hours. Coagulation Profile: No results for input(s): INR, PROTIME in the last 168 hours. Cardiac Enzymes: Recent Labs  Lab 01/22/20 0556  CKTOTAL 382   BNP (last 3 results) No results for input(s): PROBNP in the last 8760 hours. HbA1C: No results for input(s): HGBA1C in the last 72 hours. CBG: Recent Labs  Lab 01/24/20 1426  GLUCAP 73   Lipid Profile: No results for input(s): CHOL, HDL, LDLCALC, TRIG, CHOLHDL, LDLDIRECT in the last 72 hours. Thyroid  Function Tests: Recent Labs    01/22/20 0556  TSH 3.093   Anemia Panel: Recent Labs    01/23/20 0322  TIBC 235*  IRON 94   Sepsis Labs: No results for input(s): PROCALCITON, LATICACIDVEN in the last 168 hours.  Recent Results (from the past 240 hour(s))  SARS Coronavirus 2 by RT PCR (hospital order, performed in [facility] hospital lab) Nasopharyngeal Nasopharyngeal Swab     Status: None   Collection Time: 01/22/20  6:01 AM   Specimen: Nasopharyngeal Swab  Result Value Ref Range Status   SARS Coronavirus 2 NEGATIVE NEGATIVE Final    Comment: (NOTE) SARS-CoV-2 target nucleic acids are NOT DETECTED. The SARS-CoV-2 RNA is generally detectable in upper and lower respiratory specimens during the acute phase of infection. The lowest concentration of SARS-CoV-2 viral copies this assay can detect is 250 copies / mL. A negative result does not preclude SARS-CoV-2 infection and should not be used as the sole basis for treatment or other patient management decisions.  A negative result may occur with improper specimen collection / handling, submission of specimen other than nasopharyngeal swab, presence of viral mutation(s) within the areas targeted by this assay, and inadequate  number of viral copies (<250 copies / mL). A negative result must be combined with clinical observations, patient history, and epidemiological information. Fact Sheet for Patients:   StrictlyIdeas.no Fact Sheet for Healthcare Providers: BankingDealers.co.za This test is not yet approved or cleared  by the [place] FDA and has been authorized for detection and/or diagnosis of SARS-CoV-2 by FDA under an Emergency Use Authorization (EUA).  This EUA will remain in effect (meaning this test can be used) for the duration of the COVID-19 declaration under Section 564(b)(1) of the Act, 21 U.S.C. section 360bbb-3(b)(1), unless the authorization is terminated or revoked sooner. Performed at [facility], [place] [address] [place], [place] [address]      Radiology Studies: No results found.  Scheduled Meds: . amLODipine  10 mg Oral Daily  . atorvastatin  40 mg Oral Daily  . Chlorhexidine Gluconate Cloth  6 each Topical Q0600  . docusate sodium  100 mg Oral Daily  . feeding supplement (ENSURE ENLIVE)  237 mL Oral BID BM  . metoprolol tartrate  12.5 mg Oral BID  . multivitamin  1 tablet Oral QHS  . tamsulosin  0.4 mg Oral Daily   Continuous Infusions: . dextrose 5 % and 0.9% NaCl 50 mL/hr at 01/24/20 1500     LOS: 2 days   Marylu Lund, MD [facility] Pager On Amion  If 7PM-7AM, please contact night-coverage 01/24/2020, 3:18 PM

## 2020-01-24 NOTE — Plan of Care (Signed)

## 2020-01-24 NOTE — Plan of Care (Signed)

## 2020-01-25 ENCOUNTER — Encounter (HOSPITAL_COMMUNITY): Payer: Medicare Other

## 2020-01-25 DIAGNOSIS — Z992 Dependence on renal dialysis: Secondary | ICD-10-CM

## 2020-01-25 DIAGNOSIS — N186 End stage renal disease: Secondary | ICD-10-CM

## 2020-01-25 DIAGNOSIS — D631 Anemia in chronic kidney disease: Secondary | ICD-10-CM

## 2020-01-25 LAB — UPEP/UIFE/LIGHT CHAINS/TP, 24-HR UR
% BETA, Urine: 12.2 %
ALPHA 1 URINE: 1.4 %
Albumin, U: 5 %
Alpha 2, Urine: 2.8 %
Free Kappa/Lambda Ratio: 2927.88 — ABNORMAL HIGH (ref 1.03–31.76)
Free Lambda Lt Chains,Ur: 15.98 mg/L — ABNORMAL HIGH (ref 0.47–11.77)
GAMMA GLOBULIN URINE: 78.6 %
M-SPIKE %, Urine: 74.5 % — ABNORMAL HIGH
M-Spike, Mg/24 Hr: 3077 mg/24 hr — ABNORMAL HIGH
Total Protein, Urine-Ur/day: 4130 mg/24 hr — ABNORMAL HIGH (ref 30–150)
Total Protein, Urine: 295 mg/dL
Total Volume: 1400

## 2020-01-25 LAB — BASIC METABOLIC PANEL
Anion gap: 10 (ref 5–15)
BUN: 55 mg/dL — ABNORMAL HIGH (ref 8–23)
CO2: 25 mmol/L (ref 22–32)
Calcium: 9.1 mg/dL (ref 8.9–10.3)
Chloride: 104 mmol/L (ref 98–111)
Creatinine, Ser: 4.08 mg/dL — ABNORMAL HIGH (ref 0.61–1.24)
GFR calc Af Amer: 16 mL/min — ABNORMAL LOW (ref >60)
GFR calc non Af Amer: 13 mL/min — ABNORMAL LOW (ref >60)
Glucose, Bld: 167 mg/dL — ABNORMAL HIGH (ref 70–99)
Potassium: 3.5 mmol/L (ref 3.5–5.1)
Sodium: 139 mmol/L (ref 135–145)

## 2020-01-25 LAB — PROTEIN ELECTROPHORESIS, SERUM
A/G Ratio: 1.2 (ref 0.7–1.7)
Albumin ELP: 3.4 g/dL (ref 2.9–4.4)
Alpha-1-Globulin: 0.2 g/dL (ref 0.0–0.4)
Alpha-2-Globulin: 0.9 g/dL (ref 0.4–1.0)
Beta Globulin: 0.7 g/dL (ref 0.7–1.3)
Gamma Globulin: 1 g/dL (ref 0.4–1.8)
Globulin, Total: 2.8 g/dL (ref 2.2–3.9)
M-Spike, %: 0.3 g/dL — ABNORMAL HIGH
Total Protein ELP: 6.2 g/dL (ref 6.0–8.5)

## 2020-01-25 LAB — HEPATITIS B SURFACE ANTIGEN: Hepatitis B Surface Ag: NONREACTIVE

## 2020-01-25 MED ORDER — HEPARIN SODIUM (PORCINE) 1000 UNIT/ML IJ SOLN
INTRAMUSCULAR | Status: AC
  Filled 2020-01-25: qty 2

## 2020-01-25 MED ORDER — OXYCODONE HCL 5 MG PO TABS
ORAL_TABLET | ORAL | Status: AC
  Filled 2020-01-25: qty 1

## 2020-01-25 MED ORDER — HEPARIN SODIUM (PORCINE) 1000 UNIT/ML IJ SOLN
INTRAMUSCULAR | Status: AC
  Administered 2020-01-25: 3800 [IU]
  Filled 2020-01-25: qty 4

## 2020-01-25 MED ORDER — HEPARIN SODIUM (PORCINE) 5000 UNIT/ML IJ SOLN
5000.0000 [IU] | Freq: Three times a day (TID) | INTRAMUSCULAR | Status: DC
  Administered 2020-01-25: 5000 [IU] via SUBCUTANEOUS
  Administered 2020-01-25: 1200 [IU] via SUBCUTANEOUS
  Administered 2020-01-26 – 2020-01-27 (×2): 5000 [IU] via SUBCUTANEOUS
  Filled 2020-01-25 (×5): qty 1

## 2020-01-25 NOTE — Progress Notes (Signed)
[facility] Progress Note  Subjective: doing well, no complaints except fatigued after HD at 1am.    Vitals:   01/25/20 0330 01/25/20 0353 01/25/20 0436 01/25/20 0803  BP: 136/87 122/85 132/85 140/86  Pulse: 84 81 83 82  Resp: 17 18 15 17   Temp:  98.4 F (36.9 C) 98.2 F (36.8 C) 98.5 F (36.9 C)  TempSrc:  Oral Oral Oral  SpO2:  99% 99% 100%  Weight:  86.7 kg    Height:        Exam: Gen alert, calm  No jvd or bruits Chest clear bilat to bases RRR no RG, soft 1/6 sem Abd soft ntnd no mass or ascites +bs GU normal male MS no joint effusions or deformity Ext trace pretib edema Neuro is alert, Ox 3 , nf  Date                             Creat                           eGFR 2015- 18                      0.80- 1.10 Sept-Dec 2019            5.99 >> 2.10                Severe AKI Feb- mar 2020            1.5- 1.7                        45- 50 Oct 2020                     6.11 >> 5.29                AKI episode Nov                              3.7- 4.09 Aug 2019                     4.10                             15- 17 Jan                              3.82                             17 Feb                              4.41 April                             4.9- 5.10                      12 Jan 08 2020  5.63                             11 Jan 22 2020               6.51                             9   Home meds:  - norvasc 10/ lipitor 40  - norco prn/ flomax 0.4 qd  - prn's/ vitamins/ supplements  UA 5/22 - negative, prot 30   Assessment/ Plan: 1. 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  Presumed due to multiple myeloma most likely.  Pt wants to proceed with dialysis (in the past was not sure). He has uremic symptoms. [place] placed IR 5/24.  VVS Dr. Scot Dock has seen and plans UE AVF vs AVG tomorrow AM.  CLIP initiated to [facility] TTS.  We will complete a 2nd HD tomorrow after AV  access placement.   2. Multiple myeloma - f/b ONC - Dr. Irene Limbo has seen during admission, appreciate thoughtful note.  3. L renal mass - seen by urology in Oct 2020, suspected RCC.  In past pt has declined therapy - pall care called to help outline GOC.  4. BP/ vol - cont norvasc, mild LE edema on exam otherwise euvolemic.  5. BPH on flomax 6. Anemia ckd +/- malignancy - Hb 8.2 , tsat ok at 40%,  Hold on ESA in light of likely RCC until clarify further.    Jannifer Hick MD [facility] Pager [phone]   Recent Labs  Lab 01/23/20 [phone] 01/23/20 0322 01/24/20 0211 01/25/20 0637  K 3.1*   < > 4.1 3.5  BUN 90*   < > 95* 55*  CREATININE 6.31*   < > 6.18* 4.08*  CALCIUM 9.0   < > 9.4 9.1  HGB 8.0*  --  8.3*  --    < > = values in this interval not displayed.   Inpatient medications: . amLODipine  10 mg Oral Daily  . atorvastatin  40 mg Oral Daily  . Chlorhexidine Gluconate Cloth  6 each Topical Q0600  . docusate sodium  100 mg Oral Daily  . feeding supplement (ENSURE ENLIVE)  237 mL Oral BID BM  . heparin  5,000 Units Subcutaneous Q8H  . heparin sodium (porcine)      . metoprolol tartrate  12.5 mg Oral BID  . multivitamin  1 tablet Oral QHS  . tamsulosin  0.4 mg Oral Daily    heparin, ondansetron **OR** ondansetron (ZOFRAN) IV, oxyCODONE

## 2020-01-25 NOTE — Consult Note (Addendum)
ASSESSMENT & PLAN   END-STAGE RENAL DISEASE: We have been consulted for hemodialysis access.  He has a functioning tunneled dialysis catheter.  He dialyzes on Tuesdays Thursdays and Saturdays.  He is right arm dominant.  His vein mapping is pending.  I have scheduled him for placement of a left AV fistula or AV graft tomorrow.  He dialyzed today so I don't think this will interfere with his dialysis schedule.  His vein map is pending.  I have discussed the indications of the procedure and the potential complications with the patient.  ANEMIA: His hemoglobin is 8.3.  His hemoglobin dropped slightly lower he would require transfusion on dialysis.    REASON FOR CONSULT:    Consult is for hemodialysis access.  The consult is requested by [facility] Brother  HPI:   [name] [name] is a 74 y.o. male who was admitted on 01/22/2020 with weakness.  He was starting his chemotherapy and developed increasing weakness and presented to the emergency room in [facility].  He was transferred to [facility] acute renal failure.  He had been complaining of poor appetite consistent with severe anemia failure  The patient has a history of multiple myeloma with a known renal mass., now she will chronic diastolic congestive heart failure, hypertension, and anemia.  He is right-handed.  He has never had a pacemaker.  He has a right IJ tunneled dialysis catheter that was placed recently in the hospital.  Past Medical History:  Diagnosis Date  . Abnormal CT of liver    a. nodular contour suggesting cirrhosis 05/2018.  [name] Abnormal LFTs (liver function tests)   . Anxiety   . Aortic root dilatation ([place])   . Ascending aortic aneurysm ([place])   . BPH with urinary obstruction   . Bradycardia   . 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 CAD (coronary artery disease)    a. 12/2015 NSTEMI/Cath (in setting of PAF):  LM nl, LAD  30p,  LCX 71m RCA  ok, AM 100%, RPDA1 40, RPDA 2 60 ->Med Rx.  . Childhood asthma   . Chronic diastolic CHF (congestive heart failure) ([place]    a. 12/2015 Echo: EF 50-55%, mod AI, mod Ao root dil, mild MR, mod dil LA, mod RA.  . CKD (chronic kidney disease), stage III   . Colon cancer ([place]   . COPD (chronic obstructive pulmonary disease) ([place]    pt denies at preop  . Diabetes mellitus type II    diet controlled  . History of syncope   . Hyperlipidemia   . Hypertensive heart disease   . Kidney lump 04/04/2010   Overview:  Renal Cell Carcinoma   . Light chain myeloma ([place]   . Moderate aortic insufficiency    a. 12/2015 Echo: Mod AI.  .Marland KitchenParoxysmal atrial fibrillation ([place]    a. 12/2015 started on Xarelto (CHA2DS2VASc = 4-5).  . Pneumonia   . Prostate cancer ([place]   . Renal cell carcinoma ([place]   . Sleep apnea    Does not like  CPAP  . Spinal stenosis in cervical region 10/12/2016  . Venous insufficiency     Family History  Problem Relation Age of Onset  . Emphysema Father   . Asthma Father   . Liver disease Father        tumor  . Heart disease Mother   . Hypertension Mother   . Asthma Sister   .  Hypertension Son     SOCIAL HISTORY: He is not a smoker. Social History   Tobacco Use  . Smoking status: Former Smoker    Types: Cigars    Quit date: 09/02/1977    Years since quitting: 42.4  . Smokeless tobacco: Never Used  . Tobacco comment:    Substance Use Topics  . Alcohol use: Not Currently    Comment: "stopped drinking alcohol in ~ 1980; just drank a little on the weekends when I did drink"    Allergies  Allergen Reactions  . Amoxicillin Other (See Comments)    Tolerates Unasyn. Can't move Has patient had a PCN reaction causing immediate rash, facial/tongue/throat swelling, SOB or lightheadedness with hypotension: No Has patient had a PCN reaction causing severe rash involving mucus membranes or skin necrosis: No Has patient had a PCN reaction that required  hospitalization No Has patient had a PCN reaction occurring within the last 10 years: No If all of the above answers are "NO", then may proceed with Cephalosporin use.   [name] Penicillins Other (See Comments)    Tolerates Unasyn. Can't move/dizziness Has patient had a PCN reaction causing immediate rash, facial/tongue/throat swelling, SOB or lightheadedness with hypotension: No Has patient had a PCN reaction causing severe rash involving mucus membranes or skin necrosis: No Has patient had a PCN reaction that required hospitalization No Has patient had a PCN reaction occurring within the last 10 years: No If all of the above answers are "NO", then may proceed with Cephalosporin use.      Current Facility-Administered Medications  Medication Dose Route Frequency Provider Last Rate Last Admin  . amLODipine (NORVASC) tablet 10 mg  10 mg Oral Daily Rise Patience, MD   10 mg at 01/25/20 1002  . atorvastatin (LIPITOR) tablet 40 mg  40 mg Oral Daily Rise Patience, MD   40 mg at 01/25/20 1002  . Chlorhexidine Gluconate Cloth 2 % PADS 6 each  6 each Topical Q0600 Roney Jaffe, MD   6 each at 01/24/20 [phone]  . docusate sodium (COLACE) capsule 100 mg  100 mg Oral Daily Donne Hazel, MD   100 mg at 01/25/20 1001  . feeding supplement (ENSURE ENLIVE) (ENSURE ENLIVE) liquid 237 mL  237 mL Oral BID BM Donne Hazel, MD   237 mL at 01/25/20 1002  . heparin injection 1,500 Units  1,500 Units Dialysis PRN Roney Jaffe, MD   1,500 Units at 01/25/20 0130  . heparin injection 5,000 Units  5,000 Units Subcutaneous Q8H Donne Hazel, MD      . heparin sodium (porcine) 1000 UNIT/ML injection           . metoprolol tartrate (LOPRESSOR) tablet 12.5 mg  12.5 mg Oral BID Donne Hazel, MD   12.5 mg at 01/25/20 1001  . multivitamin (RENA-VIT) tablet 1 tablet  1 tablet Oral QHS Roney Jaffe, MD   1 tablet at 01/24/20 2252  . ondansetron (ZOFRAN) tablet 4 mg  4 mg Oral Q6H PRN Rise Patience, MD       Or  . ondansetron [facility]) injection 4 mg  4 mg Intravenous Q6H PRN Rise Patience, MD      . oxyCODONE (Oxy IR/ROXICODONE) immediate release tablet 5 mg  5 mg Oral Q6H PRN Donne Hazel, MD   5 mg at 01/23/20 0417  . tamsulosin (FLOMAX) capsule 0.4 mg  0.4 mg Oral Daily Rise Patience, MD   0.4 mg at 01/25/20  1002    REVIEW OF SYSTEMS:  [X]  denotes positive finding, [ ]  denotes negative finding Cardiac  Comments:  Chest pain or chest pressure:    Shortness of breath upon exertion: x   Short of breath when lying flat:    Irregular heart rhythm:        Vascular    Pain in calf, thigh, or hip brought on by ambulation:    Pain in feet at night that wakes you up from your sleep:     Blood clot in your veins:    Leg swelling:         Pulmonary    Oxygen at home:    Productive cough:     Wheezing:         Neurologic    Sudden weakness in arms or legs:     Sudden numbness in arms or legs:     Sudden onset of difficulty speaking or slurred speech:    Temporary loss of vision in one eye:     Problems with dizziness:         Gastrointestinal    Blood in stool:     Vomited blood:         Genitourinary    Burning when urinating:     Blood in urine:        Psychiatric    Major depression:         Hematologic    Bleeding problems:    Problems with blood clotting too easily:        Skin    Rashes or ulcers:        Constitutional    Fever or chills:    -  PHYSICAL EXAM:   Vitals:   01/25/20 0330 01/25/20 0353 01/25/20 0436 01/25/20 0803  BP: 136/87 122/85 132/85 140/86  Pulse: 84 81 83 82  Resp: 17 18 15 17   Temp:  98.4 F (36.9 C) 98.2 F (36.8 C) 98.5 F (36.9 C)  TempSrc:  Oral Oral Oral  SpO2:  99% 99% 100%  Weight:  86.7 kg    Height:       Body mass index is 28.23 kg/m. GENERAL: The patient is a well-nourished male, in no acute distress. The vital signs are documented above. CARDIAC: There is a regular rate and rhythm.    VASCULAR: I do not detect carotid bruits. He has palpable brachial and radial pulses bilaterally. He has an IV in his right forearm.  Also likes He has palpable femoral and posterior tibial pulses bilaterally. PULMONARY: There is good air exchange bilaterally without wheezing or rales. ABDOMEN: Soft and non-tender with normal pitched bowel sounds.  I do not palpate an aneurysm.  He has a large ventral hernia. MUSCULOSKELETAL: There are no major deformities. NEUROLOGIC: No focal weakness or paresthesias are detected. SKIN: There are no ulcers or rashes noted. PSYCHIATRIC: The patient has a normal affect.  DATA:    VEIN MAP: This vein map is pending.  Lab Results  Component Value Date   WBC 5.8 01/24/2020   HGB 8.3 (L) 01/24/2020   HCT 24.9 (L) 01/24/2020   MCV 94.3 01/24/2020   PLT 225 01/24/2020   Lab Results  Component Value Date   NA 139 01/25/2020   K 3.5 01/25/2020   CL 104 01/25/2020   CO2 25 01/25/2020   Lab Results  Component Value Date   CREATININE 4.08 (H) 01/25/2020   Lab Results  Component Value Date   INR 1.0  12/24/2019   INR 0.9 06/28/2019   INR 0.8 06/04/2019   Lab Results  Component Value Date   HGBA1C 5.2 06/26/2019   CBG (last 3)  Recent Labs    01/24/20 1426  GLUCAP 73  He did great  Deitra Mayo [facility] [place]: [phone] Office: [phone]

## 2020-01-25 NOTE — Evaluation (Signed)
Occupational Therapy Evaluation Patient Details Name: [name] [name] MRN: [id number] DOB: Dec 23, 1945 Today's Date: 01/25/2020    History of Present Illness Pt is a 74 y/o male admitted secondary to increased weakness. 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 HD has been initiated. Pt also with multiple myeloma with renal mass concerning for RCC. PMH includes HTN, a fib, CAD, dCHF, AAA, and COPD.    Clinical Impression   PTA, pt was living at home alone, pt reports he was independent with ADL/IADL and functional mobility. Pt reports no falls. He reports he is still driving. Pt did mention that he would be interested in learning about more resources for assistance with cooking and cleaning. Pt currently reports he is functioning close to baseline. Pt requires supervision for ADL completion and supervision for functional mobility without AD. Pt enjoys telling stories. Patient evaluated by Occupational Therapy with no further acute OT needs identified. All education has been completed and the patient has no further questions. See below for any follow-up Occupational Therapy or equipment needs. OT to sign off. Thank you for referral.     Follow Up Recommendations  No OT follow up    Equipment Recommendations  None recommended by OT    Recommendations for Other Services       Precautions / Restrictions Precautions Precautions: None Restrictions Weight Bearing Restrictions: No      Mobility Bed Mobility               General bed mobility comments: pt ambulating back from bathroom upon arrival  Transfers Overall transfer level: Needs assistance Equipment used: None Transfers: Sit to/from Stand Sit to Stand: Supervision         General transfer comment: supervision for safety    Balance Overall balance assessment: Needs assistance Sitting-balance support: No upper extremity supported;Feet supported Sitting balance-Leahy Scale: Good     Standing balance support: No  upper extremity supported;During functional activity Standing balance-Leahy Scale: Good                             ADL either performed or assessed with clinical judgement   ADL Overall ADL's : Needs assistance/impaired                                       General ADL Comments: supervision for completion of UB/LB ADL;pt in bathroom upon arrival;pt demonstrated good balance, able to pick item up off ground and touch toes without LOB     Vision         Perception     Praxis      Pertinent Vitals/Pain Pain Assessment: No/denies pain     Hand Dominance Right   Extremity/Trunk Assessment Upper Extremity Assessment Upper Extremity Assessment: Overall WFL for tasks assessed   Lower Extremity Assessment Lower Extremity Assessment: Defer to PT evaluation   Cervical / Trunk Assessment Cervical / Trunk Assessment: Normal   Communication Communication Communication: No difficulties   Cognition Arousal/Alertness: Awake/alert Behavior During Therapy: WFL for tasks assessed/performed Overall Cognitive Status: Within Functional Limits for tasks assessed                                 General Comments: pt tangential with thoughts, pt's sister reports this is normal and pt is  a fan of telling stories   General Comments  pt's sister present during session    Exercises     Shoulder [place] expects to be discharged to:: Private residence Living Arrangements: Alone Available Help at Discharge: Family;Available PRN/intermittently Type of Home: Apartment Home Access: Level entry     Home Layout: One level     Bathroom Shower/Tub: Teacher, early years/pre: Standard     Home Equipment: None          Prior Functioning/Environment Level of Independence: Independent                 OT Problem List: Decreased safety awareness;Impaired balance (sitting and/or standing)       OT Treatment/Interventions:      OT Goals(Current goals can be found in the care plan section) Acute Rehab OT Goals Patient Stated Goal: to go home OT Goal Formulation: With patient Time For Goal Achievement: 02/08/20 Potential to Achieve Goals: Good  OT Frequency:     Barriers to D/C:            Co-evaluation              AM-PAC OT "6 Clicks" Daily Activity     Outcome Measure Help from another person eating meals?: None Help from another person taking care of personal grooming?: None Help from another person toileting, which includes using toliet, bedpan, or urinal?: None Help from another person bathing (including washing, rinsing, drying)?: None Help from another person to put on and taking off regular upper body clothing?: None Help from another person to put on and taking off regular lower body clothing?: None 6 Click Score: 24   End of Session Equipment Utilized During Treatment: Gait belt Nurse Communication: Mobility status  Activity Tolerance: Patient tolerated treatment well Patient left: in bed;with call bell/phone within reach;with family/visitor present  OT Visit Diagnosis: Other abnormalities of gait and mobility (R26.89)                Time: 1340-1402 OT Time Calculation (min): 22 min Charges:  OT General Charges $OT Visit: 1 Visit OT Evaluation $OT Eval Moderate Complexity: 1 Mod  [name] OTR/L Acute Rehabilitation Services Office: [place] 01/25/2020, 3:33 PM

## 2020-01-25 NOTE — Progress Notes (Signed)
Patient has been accepted at [facility] for OP HD treatment on a TTS schedule with a seat time of 7:20am. He needs to arrive at 7:00am for his appointments.  He will need to report to the clinic the day before his first treatment in order to complete intake paperwork. Nephrologist and Attending updated. Navigator will meet with patient and update family and monitor for discharge in order to assist with a smooth transition from hospital to OP HD clinic start.  Alphonzo Cruise,  Renal Navigator [phone]

## 2020-01-25 NOTE — Progress Notes (Addendum)
PROGRESS NOTE    AKHILESH SASSONE  GYJ:[id number] DOB: 02/02/1946 DOA: 01/22/2020 PCP: Nolene Ebbs, MD    Brief Narrative:  74 y.o. male with history of multiple myeloma renal mass diastolic CHF hypertension anemia had been originally planned to follow-up with oncologist on June 1 week for potentially starting chemo for his multiple myeloma was found to be increasing weakness and had presented to the ER at [facility] yesterday.  Over the patient's creatinine is found to be around 6.7 increased from 5.62 weeks ago hemoglobin was stable around 8.4.  Potassium was 3.4.  Patient on exam did not look like patient being any fluid overload.  But due to worsening renal function patient also in addition complaining of poor appetite and uremic symptoms patient was transferred to [facility] at the request of patient and patient's family since patient was already seen by oncologist aware.  On my exam patient is not in distress.  Denies any chest pain or shortness of breath.  In the month of October 2020 patient was admitted for acute renal failure in the setting of multiple myeloma had undergone 3 plasmapheresis treatment at the time.  Patient states since then patient had not followed up with oncologist at [facility] and had proceeded to follow-up with [facility] oncologist again with nephrology follow-up.    Of note, pt did not desire further treatment for myeloma as of 11/20, however, now agreeable to full treatment, including plasmapheresis if indicated  Assessment & Plan:   Principal Problem:   ARF (acute renal failure) ([place]) Active Problems:   Essential (primary) hypertension   Paroxysmal atrial fibrillation (HCC)   Light chain myeloma (HCC)   CAD (coronary artery disease)   Chronic diastolic CHF (congestive heart failure) ([place])   Anemia   Ascending aortic aneurysm ([place])  1. Acute on chronic kidney disease with history of multiple myeloma previously requiring plasmapheresis in October  2020 1. Worsening renal function on presentation 2. Patient agrees to full treatment, including plasmapheresis and HD if indicated 3. Nephrology is following 4. Avoid nephrotoxic agents 5. IR consulted and pt underwent HD cath placement 5/24 with initiation of HD 6. Vascular Surgery following. Patient planned for L AVG or AVF 5/26 2. Hypertension 1. BP remains stable at this time 2. Will continue on amlodipine as pt tolerates 3. History of chronic diastolic CHF 1. Seems to be compensated at this time 4. History of multiple myeloma  1. Chart reviewed. Pt had previously followed with Dr. Irene Limbo 2. As of 11/20, pt had elected to stop all treatments 3. Pt now agreeable to treatment if indicated 4. Myeloma labs pending, follow up serum light chains markedly elevated at over 1,200 5. Oncology consulted. Poor prognosis noted with recommendation for Palliative Care referral. Have placed consult 5. 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 Hemodynamically stable 2. Repeat CBC in AM 6. Hx a fibrillation 1. Continued on tele for now 2. Remains stable currently 7. Ascending aortic aneurysm 1. Denied chest pains this AM 8. History of renal mass has not pursued further follow-up.   1. CT renal reviewed. Findings of 8.7cm L renal mass 2. Have discussed case with Urology. Pt is well known to [facility] and pt follows Dr. Louis Meckel. Of note, patient was recommended to undergo surgery in the fall of 2020, however pt was lost to follow up with patient stating, "I had too much going on" 3. Case was earlier discussed with Dr. Jeffie Pollock who recommends close outpt follow up  DVT prophylaxis: Heparin subq Code Status: Full Family Communication: Pt in room, family not at bedside  Status is: Inpatient  Remains inpatient appropriate because:Ongoing diagnostic testing needed not appropriate for outpatient work up and IV treatments appropriate due to intensity of illness or inability to take  PO   Dispo: The patient is from: Home              Anticipated d/c is to: Home              Anticipated d/c date is: 2 days              Patient currently is not medically stable to d/c.   Consultants:   Nephrology  Oncology  Discussed case with Urology  Vascular Surgery  Procedures:   Tunneled HD cath 5/24 by IR  Antimicrobials: Anti-infectives (From admission, onward)   Start     Dose/Rate Route Frequency Ordered Stop   01/24/20 1628  vancomycin (VANCOCIN) 1-5 GM/200ML-% IVPB    Note to Pharmacy: Arlean Hopping   : cabinet override      01/24/20 1628 01/24/20 1633      Subjective: This AM, feeling hungry  Objective: Vitals:   01/25/20 0353 01/25/20 0436 01/25/20 0803 01/25/20 1513  BP: 122/85 132/85 140/86 121/76  Pulse: 81 83 82 68  Resp: 18 15 17 17   Temp: 98.4 F (36.9 C) 98.2 F (36.8 C) 98.5 F (36.9 C) 98.6 F (37 C)  TempSrc: Oral Oral Oral Oral  SpO2: 99% 99% 100% 99%  Weight: 86.7 kg     Height:        Intake/Output Summary (Last 24 hours) at 01/25/2020 1716 Last data filed at 01/25/2020 0400 Gross per 24 hour  Intake 299.1 ml  Output 1650 ml  Net -1350.9 ml   Filed Weights   01/24/20 0700 01/25/20 0109 01/25/20 0353  Weight: 85.7 kg 88.5 kg 86.7 kg    Examination: General exam: Conversant, in no acute distress Respiratory system: normal chest rise, clear, no audible wheezing Cardiovascular system: regular rhythm, s1-s2 Gastrointestinal system: Nondistended, nontender, pos BS Central nervous system: No seizures, no tremors Extremities: No cyanosis, no joint deformities Skin: No rashes, no pallor Psychiatry: Affect normal // no auditory hallucinations   Data Reviewed: I have personally reviewed following labs and imaging studies  CBC: Recent Labs  Lab 01/22/20 0556 01/23/20 0322 01/24/20 0211  WBC 5.1 5.3 5.8  NEUTROABS 3.4  --   --   HGB 8.2* 8.0* 8.3*  HCT 24.9* 23.9* 24.9*  MCV 94.7 93.4 94.3  PLT 213 215 034   Basic  Metabolic Panel: Recent Labs  Lab 01/22/20 0556 01/23/20 0322 01/24/20 0211 01/25/20 0637  NA 139 137 138 139  K 3.3* 3.1* 4.1 3.5  CL 103 106 101 104  CO2 22 20* 22 25  GLUCOSE 86 88 109* 167*  BUN 90* 90* 95* 55*  CREATININE 6.51* 6.31* 6.18* 4.08*  CALCIUM 9.2 9.0 9.4 9.1  MG  --  1.9  --   --    GFR: Estimated Creatinine Clearance: 17.1 mL/min (A) (by C-G formula based on SCr of 4.08 mg/dL (H)). Liver Function Tests: Recent Labs  Lab 01/22/20 0556 01/23/20 0322  AST 16 12*  ALT 14 13  ALKPHOS 74 61  BILITOT 1.2 0.4  PROT 6.7 6.4*  ALBUMIN 3.4* 3.3*   No results for input(s): LIPASE, AMYLASE in the last 168 hours. No results for input(s): AMMONIA in the last 168 hours. Coagulation Profile:  No results for input(s): INR, PROTIME in the last 168 hours. Cardiac Enzymes: Recent Labs  Lab 01/22/20 0556  CKTOTAL 382   BNP (last 3 results) No results for input(s): PROBNP in the last 8760 hours. HbA1C: No results for input(s): HGBA1C in the last 72 hours. CBG: Recent Labs  Lab 01/24/20 1426  GLUCAP 73   Lipid Profile: No results for input(s): CHOL, HDL, LDLCALC, TRIG, CHOLHDL, LDLDIRECT in the last 72 hours. Thyroid Function Tests: No results for input(s): TSH, T4TOTAL, FREET4, T3FREE, THYROIDAB in the last 72 hours. Anemia Panel: Recent Labs    01/23/20 0322  TIBC 235*  IRON 94   Sepsis Labs: No results for input(s): PROCALCITON, LATICACIDVEN in the last 168 hours.  Recent Results (from the past 240 hour(s))  SARS Coronavirus 2 by RT PCR (hospital order, performed in [facility] hospital lab) Nasopharyngeal Nasopharyngeal Swab     Status: None   Collection Time: 01/22/20  6:01 AM   Specimen: Nasopharyngeal Swab  Result Value Ref Range Status   SARS Coronavirus 2 NEGATIVE NEGATIVE Final    Comment: (NOTE) SARS-CoV-2 target nucleic acids are NOT DETECTED. The SARS-CoV-2 RNA is generally detectable in upper and lower respiratory specimens during the  acute phase of infection. The lowest concentration of SARS-CoV-2 viral copies this assay can detect is 250 copies / mL. A negative result does not preclude SARS-CoV-2 infection and should not be used as the sole basis for treatment or other patient management decisions.  A negative result may occur with improper specimen collection / handling, submission of specimen other than nasopharyngeal swab, presence of viral mutation(s) within the areas targeted by this assay, and inadequate number of viral copies (<250 copies / mL). A negative result must be combined with clinical observations, patient history, and epidemiological information. Fact Sheet for Patients:   StrictlyIdeas.no Fact Sheet for Healthcare Providers: BankingDealers.co.za This test is not yet approved or cleared  by the [place] FDA and has been authorized for detection and/or diagnosis of SARS-CoV-2 by FDA under an Emergency Use Authorization (EUA).  This EUA will remain in effect (meaning this test can be used) for the duration of the COVID-19 declaration under Section 564(b)(1) of the Act, 21 U.S.C. section 360bbb-3(b)(1), unless the authorization is terminated or revoked sooner. Performed at [facility], [place] [address], [place], [place] [address]      Radiology Studies: IR Fluoro Guide CV Line Right  Result Date: 01/24/2020 INDICATION: 74 year old male referred for tunneled hemodialysis catheter EXAM: TUNNELED CENTRAL VENOUS HEMODIALYSIS CATHETER PLACEMENT WITH ULTRASOUND AND FLUOROSCOPIC GUIDANCE MEDICATIONS: 1 g vancomycin. The antibiotic was given in an appropriate time interval prior to skin puncture. ANESTHESIA/SEDATION: Moderate (conscious) sedation was employed during this procedure. A total of Versed 1.0 mg and Fentanyl 25 mcg was administered intravenously. Moderate Sedation Time: 14 minutes. The patient's level of consciousness and vital signs were  monitored continuously by radiology nursing throughout the procedure under my direct supervision. FLUOROSCOPY TIME:  Fluoroscopy Time: 0 minutes 48 seconds (4 mGy). COMPLICATIONS: None PROCEDURE: Informed written consent was obtained from the patient after a discussion of the risks, benefits, and alternatives to treatment. Questions regarding the procedure were encouraged and answered. The right neck and chest were prepped with chlorhexidine in a sterile fashion, and a sterile drape was applied covering the operative field. Maximum barrier sterile technique with sterile gowns and gloves were used for the procedure. A timeout was performed prior to the initiation of the procedure. Ultrasound survey was  performed. Micropuncture kit was utilized to access the right internal jugular vein under direct, real-time ultrasound guidance after the overlying soft tissues were anesthetized with 1% lidocaine with epinephrine. Stab incision was made with 11 blade scalpel. Microwire was passed centrally. The microwire was then marked to measure appropriate internal catheter length. External tunneled length was estimated. A total tip to cuff length of 23 cm was selected. 035 guidewire was advanced to the level of the IVC. Skin and subcutaneous tissues of chest wall below the clavicle were generously infiltrated with 1% lidocaine for local anesthesia. A small stab incision was made with 11 blade scalpel. The selected hemodialysis catheter was tunneled in a retrograde fashion from the anterior chest wall to the venotomy incision. Serial dilation was performed and then a peel-away sheath was placed. The catheter was then placed through the peel-away sheath with tips ultimately positioned within the superior aspect of the right atrium. Final catheter positioning was confirmed and documented with a spot radiographic image. The catheter aspirates and flushes normally. The catheter was flushed with appropriate volume heparin dwells. The  catheter exit site was secured with a 0-Prolene retention suture. Gel-Foam slurry was infused into the soft tissue tract. The venotomy incision was closed Derma bond and sterile dressing. Dressings were applied at the chest wall. Patient tolerated the procedure well and remained hemodynamically stable throughout. No complications were encountered and no significant blood loss encountered. IMPRESSION: Status post right IJ tunneled hemodialysis catheter. Signed, Dulcy Fanny. Dellia Nims, RPVI [facility] Radiology Electronically Signed   By: Corrie Mckusick D.O.   On: 01/24/2020 17:27   IR US Guide Vasc Access Right  Result Date: 01/24/2020 INDICATION: 73 year old male referred for tunneled hemodialysis catheter EXAM: TUNNELED CENTRAL VENOUS HEMODIALYSIS CATHETER PLACEMENT WITH ULTRASOUND AND FLUOROSCOPIC GUIDANCE MEDICATIONS: 1 g vancomycin. The antibiotic was given in an appropriate time interval prior to skin puncture. ANESTHESIA/SEDATION: Moderate (conscious) sedation was employed during this procedure. A total of Versed 1.0 mg and Fentanyl 25 mcg was administered intravenously. Moderate Sedation Time: 14 minutes. The patient's level of consciousness and vital signs were monitored continuously by radiology nursing throughout the procedure under my direct supervision. FLUOROSCOPY TIME:  Fluoroscopy Time: 0 minutes 48 seconds (4 mGy). COMPLICATIONS: None PROCEDURE: Informed written consent was obtained from the patient after a discussion of the risks, benefits, and alternatives to treatment. Questions regarding the procedure were encouraged and answered. The right neck and chest were prepped with chlorhexidine in a sterile fashion, and a sterile drape was applied covering the operative field. Maximum barrier sterile technique with sterile gowns and gloves were used for the procedure. A timeout was performed prior to the initiation of the procedure. Ultrasound survey was  performed. Micropuncture kit was utilized to access the right internal jugular vein under direct, real-time ultrasound guidance after the overlying soft tissues were anesthetized with 1% lidocaine with epinephrine. Stab incision was made with 11 blade scalpel. Microwire was passed centrally. The microwire was then marked to measure appropriate internal catheter length. External tunneled length was estimated. A total tip to cuff length of 23 cm was selected. 035 guidewire was advanced to the level of the IVC. Skin and subcutaneous tissues of chest wall below the clavicle were generously infiltrated with 1% lidocaine for local anesthesia. A small stab incision was made with 11 blade scalpel. The selected hemodialysis catheter was tunneled in a retrograde fashion from the anterior chest wall to the venotomy incision. Serial dilation was performed and then  a peel-away sheath was placed. The catheter was then placed through the peel-away sheath with tips ultimately positioned within the superior aspect of the right atrium. Final catheter positioning was confirmed and documented with a spot radiographic image. The catheter aspirates and flushes normally. The catheter was flushed with appropriate volume heparin dwells. The catheter exit site was secured with a 0-Prolene retention suture. Gel-Foam slurry was infused into the soft tissue tract. The venotomy incision was closed Derma bond and sterile dressing. Dressings were applied at the chest wall. Patient tolerated the procedure well and remained hemodynamically stable throughout. No complications were encountered and no significant blood loss encountered. IMPRESSION: Status post right IJ tunneled hemodialysis catheter. Signed, Dulcy Fanny. Dellia Nims, RPVI [facility] Inter[facility] Radiology Specialists [facility] Radiology Electronically Signed   By: Corrie Mckusick D.O.   On: 01/24/2020 17:27    Scheduled Meds: . amLODipine  10 mg Oral Daily  . atorvastatin  40 mg  Oral Daily  . Chlorhexidine Gluconate Cloth  6 each Topical Q0600  . docusate sodium  100 mg Oral Daily  . feeding supplement (ENSURE ENLIVE)  237 mL Oral BID BM  . heparin  5,000 Units Subcutaneous Q8H  . metoprolol tartrate  12.5 mg Oral BID  . multivitamin  1 tablet Oral QHS  . tamsulosin  0.4 mg Oral Daily   Continuous Infusions:    LOS: 3 days   Marylu Lund, MD [facility] Pager On Amion  If 7PM-7AM, please contact night-coverage 01/25/2020, 5:16 PM

## 2020-01-25 NOTE — Evaluation (Signed)
Physical Therapy Evaluation Patient Details Name: [name] [name] MRN: [id number] DOB: May 12, 1946 Today's Date: 01/25/2020   History of Present Illness  Pt is a 74 y/o male admitted secondary to increased weakness. 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 HD has been initiated. Pt also with multiple myeloma with renal mass concerning for RCC. PMH includes HTN, a fib, CAD, dCHF, AAA, and COPD.   Clinical Impression  Pt admitted secondary to problem above with deficits below. Pt requiring min guard A for mobility tasks. Limited in gait tolerance secondary to fatigue. Somewhat tangential throughout, but may be at baseline. Reports children check on him occasionally. Will continue to follow acutely to maximize functional mobility independence and safety.     Follow Up Recommendations Home health PT    Equipment Recommendations  Other (comment)(TBD pending mobility progression )    Recommendations for Other Services       Precautions / Restrictions Precautions Precautions: None Restrictions Weight Bearing Restrictions: No      Mobility  Bed Mobility               General bed mobility comments: Pt sitting in chair during session.   Transfers Overall transfer level: Needs assistance Equipment used: None Transfers: Sit to/from Stand Sit to Stand: Min guard         General transfer comment: Min guard for safety.   Ambulation/Gait Ambulation/Gait assistance: Min guard Gait Distance (Feet): 40 Feet Assistive device: None Gait Pattern/deviations: Step-through pattern;Decreased stride length Gait velocity: Decreased   General Gait Details: Waddle type gait. Mild unsteadiness, however, no LOB noted. Pt limited secondary to fatigue.   Stairs            Wheelchair Mobility    Modified Rankin (Stroke Patients Only)       Balance Overall balance assessment: Needs assistance Sitting-balance support: No upper extremity supported;Feet supported Sitting  balance-Leahy Scale: Good     Standing balance support: No upper extremity supported;During functional activity Standing balance-Leahy Scale: Fair                               Pertinent Vitals/Pain Pain Assessment: No/denies pain    Home Living Family/patient expects to be discharged to:: Private residence Living Arrangements: Alone Available Help at Discharge: Family;Available PRN/intermittently Type of Home: Apartment Home Access: Level entry     Home Layout: One level Home Equipment: None      Prior Function Level of Independence: Independent               Hand Dominance        Extremity/Trunk Assessment   Upper Extremity Assessment Upper Extremity Assessment: Defer to OT evaluation    Lower Extremity Assessment Lower Extremity Assessment: Generalized weakness    Cervical / Trunk Assessment Cervical / Trunk Assessment: Normal  Communication   Communication: No difficulties  Cognition Arousal/Alertness: Awake/alert Behavior During Therapy: WFL for tasks assessed/performed Overall Cognitive Status: No family/caregiver present to determine baseline cognitive functioning                                 General Comments: Pt somewhat tangential, but otherwise appropriate.       General Comments      Exercises     Assessment/Plan    PT Assessment Patient needs continued PT services  PT Problem List Decreased strength;Decreased balance;Decreased  activity tolerance;Decreased mobility;Decreased knowledge of precautions       PT Treatment Interventions DME instruction;Gait training;Functional mobility training;Therapeutic activities;Therapeutic exercise;Balance training;Patient/family education    PT Goals (Current goals can be found in the Care Plan section)  Acute Rehab PT Goals Patient Stated Goal: to go home PT Goal Formulation: With patient Time For Goal Achievement: 02/08/20 Potential to Achieve Goals: Good     Frequency Min 3X/week   Barriers to discharge Decreased caregiver support      Co-evaluation               AM-PAC PT "6 Clicks" Mobility  Outcome Measure Help needed turning from your back to your side while in a flat bed without using bedrails?: None Help needed moving from lying on your back to sitting on the side of a flat bed without using bedrails?: None Help needed moving to and from a bed to a chair (including a wheelchair)?: A Little Help needed standing up from a chair using your arms (e.g., wheelchair or bedside chair)?: A Little Help needed to walk in hospital room?: A Little Help needed climbing 3-5 steps with a railing? : A Lot 6 Click Score: 19    End of Session Equipment Utilized During Treatment: Gait belt Activity Tolerance: Patient tolerated treatment well Patient left: in chair;with call bell/phone within reach Nurse Communication: Mobility status PT Visit Diagnosis: Other abnormalities of gait and mobility (R26.89);Muscle weakness (generalized) (M62.81)    Time: 3435-6861 PT Time Calculation (min) (ACUTE ONLY): 16 min   Charges:   PT Evaluation $PT Eval Low Complexity: 1 Low          [name] [name], DPT  Acute Rehabilitation Services  Pager: [phone] Office: [phone]   [name] [name] 01/25/2020, 11:20 AM

## 2020-01-25 NOTE — Plan of Care (Signed)

## 2020-01-26 ENCOUNTER — Inpatient Hospital Stay (HOSPITAL_COMMUNITY): Payer: Medicare Other | Admitting: Anesthesiology

## 2020-01-26 ENCOUNTER — Encounter (HOSPITAL_COMMUNITY): Payer: Self-pay | Admitting: Internal Medicine

## 2020-01-26 ENCOUNTER — Encounter (HOSPITAL_COMMUNITY)
Admission: AD | Disposition: A | Discharge status: Home Health | Payer: Self-pay | Source: Other Acute Inpatient Hospital | Attending: Internal Medicine

## 2020-01-26 DIAGNOSIS — Z515 Encounter for palliative care: Secondary | ICD-10-CM

## 2020-01-26 DIAGNOSIS — I480 Paroxysmal atrial fibrillation: Secondary | ICD-10-CM

## 2020-01-26 DIAGNOSIS — D649 Anemia, unspecified: Secondary | ICD-10-CM

## 2020-01-26 DIAGNOSIS — Z7189 Other specified counseling: Secondary | ICD-10-CM

## 2020-01-26 HISTORY — PX: AV FISTULA PLACEMENT: SHX1204

## 2020-01-26 LAB — GLUCOSE, CAPILLARY
Glucose-Capillary: 120 mg/dL — ABNORMAL HIGH (ref 70–99)
Glucose-Capillary: 88 mg/dL (ref 70–99)

## 2020-01-26 LAB — BASIC METABOLIC PANEL
Anion gap: 12 (ref 5–15)
BUN: 34 mg/dL — ABNORMAL HIGH (ref 8–23)
CO2: 29 mmol/L (ref 22–32)
Calcium: 8.9 mg/dL (ref 8.9–10.3)
Chloride: 99 mmol/L (ref 98–111)
Creatinine, Ser: 3.57 mg/dL — ABNORMAL HIGH (ref 0.61–1.24)
GFR calc Af Amer: 18 mL/min — ABNORMAL LOW (ref >60)
GFR calc non Af Amer: 16 mL/min — ABNORMAL LOW (ref >60)
Glucose, Bld: 107 mg/dL — ABNORMAL HIGH (ref 70–99)
Potassium: 4.1 mmol/L (ref 3.5–5.1)
Sodium: 140 mmol/L (ref 135–145)

## 2020-01-26 SURGERY — ARTERIOVENOUS (AV) FISTULA CREATION
Anesthesia: Monitor Anesthesia Care | Site: Arm Upper | Laterality: Left

## 2020-01-26 MED ORDER — LIDOCAINE-EPINEPHRINE (PF) 1 %-1:200000 IJ SOLN
INTRAMUSCULAR | Status: AC
  Filled 2020-01-26: qty 30

## 2020-01-26 MED ORDER — SODIUM CHLORIDE 0.9 % IV SOLN
INTRAVENOUS | Status: DC | PRN
  Administered 2020-01-26: 500 mL

## 2020-01-26 MED ORDER — DEXMEDETOMIDINE HCL 200 MCG/2ML IV SOLN
INTRAVENOUS | Status: DC | PRN
  Administered 2020-01-26 (×2): 8 ug via INTRAVENOUS

## 2020-01-26 MED ORDER — FENTANYL CITRATE (PF) 250 MCG/5ML IJ SOLN
INTRAMUSCULAR | Status: AC
  Filled 2020-01-26: qty 5

## 2020-01-26 MED ORDER — LIDOCAINE 2% (20 MG/ML) 5 ML SYRINGE
INTRAMUSCULAR | Status: AC
  Filled 2020-01-26: qty 5

## 2020-01-26 MED ORDER — GLYCOPYRROLATE PF 0.2 MG/ML IJ SOSY
PREFILLED_SYRINGE | INTRAMUSCULAR | Status: AC
  Filled 2020-01-26: qty 1

## 2020-01-26 MED ORDER — VANCOMYCIN HCL 2000 MG/400ML IV SOLN
2000.0000 mg | INTRAVENOUS | Status: AC
  Administered 2020-01-26: 2000 mg via INTRAVENOUS
  Filled 2020-01-26: qty 400

## 2020-01-26 MED ORDER — PROPOFOL 10 MG/ML IV BOLUS
INTRAVENOUS | Status: AC
  Filled 2020-01-26: qty 20

## 2020-01-26 MED ORDER — PROPOFOL 10 MG/ML IV BOLUS
INTRAVENOUS | Status: DC | PRN
Start: 2020-01-26 — End: 2020-01-26
  Administered 2020-01-26: 20 mg via INTRAVENOUS

## 2020-01-26 MED ORDER — NEPRO/CARBSTEADY PO LIQD
237.0000 mL | Freq: Two times a day (BID) | ORAL | Status: DC
  Administered 2020-01-26: 237 mL via ORAL

## 2020-01-26 MED ORDER — 0.9 % SODIUM CHLORIDE (POUR BTL) OPTIME
TOPICAL | Status: DC | PRN
  Administered 2020-01-26: 1000 mL

## 2020-01-26 MED ORDER — SODIUM CHLORIDE 0.9 % IV SOLN
INTRAVENOUS | Status: AC
  Filled 2020-01-26: qty 1.2

## 2020-01-26 MED ORDER — SODIUM CHLORIDE 0.9 % IV SOLN: INTRAVENOUS | Status: DC

## 2020-01-26 MED ORDER — LIDOCAINE 2% (20 MG/ML) 5 ML SYRINGE
INTRAMUSCULAR | Status: DC | PRN
  Administered 2020-01-26: 50 mg via INTRAVENOUS

## 2020-01-26 MED ORDER — ORAL CARE MOUTH RINSE: 15.0000 mL | Freq: Once | OROMUCOSAL | Status: AC

## 2020-01-26 MED ORDER — LIDOCAINE-EPINEPHRINE (PF) 1 %-1:200000 IJ SOLN
INTRAMUSCULAR | Status: DC | PRN
  Administered 2020-01-26: 10 mL via INTRADERMAL

## 2020-01-26 MED ORDER — PROPOFOL 500 MG/50ML IV EMUL
INTRAVENOUS | Status: DC | PRN
  Administered 2020-01-26: 25 ug/kg/min via INTRAVENOUS

## 2020-01-26 MED ORDER — FENTANYL CITRATE (PF) 250 MCG/5ML IJ SOLN
INTRAMUSCULAR | Status: DC | PRN
  Administered 2020-01-26 (×3): 50 ug via INTRAVENOUS

## 2020-01-26 MED ORDER — ONDANSETRON HCL 4 MG/2ML IJ SOLN
INTRAMUSCULAR | Status: AC
  Filled 2020-01-26: qty 2

## 2020-01-26 MED ORDER — CHLORHEXIDINE GLUCONATE 0.12 % MT SOLN
15.0000 mL | Freq: Once | OROMUCOSAL | Status: AC
  Administered 2020-01-26: 15 mL via OROMUCOSAL
  Filled 2020-01-26: qty 15

## 2020-01-26 SURGICAL SUPPLY — 29 items
ADH SKN CLS APL DERMABOND .7 (GAUZE/BANDAGES/DRESSINGS) ×1
ARMBAND PINK RESTRICT EXTREMIT (MISCELLANEOUS) ×3 IMPLANT
CANISTER SUCT 3000ML PPV (MISCELLANEOUS) ×3 IMPLANT
CLIP VESOCCLUDE MED 6/CT (CLIP) ×3 IMPLANT
CLIP VESOCCLUDE SM WIDE 6/CT (CLIP) ×3 IMPLANT
COVER PROBE W GEL 5X96 (DRAPES) IMPLANT
COVER WAND RF STERILE (DRAPES) ×3 IMPLANT
DERMABOND ADVANCED (GAUZE/BANDAGES/DRESSINGS) ×2
DERMABOND ADVANCED .7 DNX12 (GAUZE/BANDAGES/DRESSINGS) ×1 IMPLANT
ELECT REM PT RETURN 9FT ADLT (ELECTROSURGICAL) ×3
ELECTRODE REM PT RTRN 9FT ADLT (ELECTROSURGICAL) ×1 IMPLANT
GLOVE BIO SURGEON STRL SZ7.5 (GLOVE) ×3 IMPLANT
GOWN STRL REUS W/ TWL LRG LVL3 (GOWN DISPOSABLE) ×2 IMPLANT
GOWN STRL REUS W/ TWL XL LVL3 (GOWN DISPOSABLE) ×1 IMPLANT
GOWN STRL REUS W/TWL LRG LVL3 (GOWN DISPOSABLE) ×6
GOWN STRL REUS W/TWL XL LVL3 (GOWN DISPOSABLE) ×3
INSERT FOGARTY SM (MISCELLANEOUS) IMPLANT
KIT BASIN OR (CUSTOM PROCEDURE TRAY) ×3 IMPLANT
KIT TURNOVER KIT B (KITS) ×3 IMPLANT
NS IRRIG 1000ML POUR BTL (IV SOLUTION) ×3 IMPLANT
PACK CV ACCESS (CUSTOM PROCEDURE TRAY) ×3 IMPLANT
PAD ARMBOARD 7.5X6 YLW CONV (MISCELLANEOUS) ×6 IMPLANT
SUT MNCRL AB 4-0 PS2 18 (SUTURE) ×3 IMPLANT
SUT PROLENE 6 0 BV (SUTURE) ×3 IMPLANT
SUT VIC AB 3-0 SH 27 (SUTURE) ×3
SUT VIC AB 3-0 SH 27X BRD (SUTURE) ×1 IMPLANT
TOWEL GREEN STERILE (TOWEL DISPOSABLE) ×3 IMPLANT
UNDERPAD 30X36 HEAVY ABSORB (UNDERPADS AND DIAPERS) ×3 IMPLANT
WATER STERILE IRR 1000ML POUR (IV SOLUTION) ×3 IMPLANT

## 2020-01-26 NOTE — Progress Notes (Signed)
PROGRESS NOTE    [name] [name]  HCW:[id number] DOB: [birth year] DOA: 01/22/2020 PCP: Nolene Ebbs, MD    Brief Narrative:  [name] [name] is a 74 year old man with a history of multiple myeloma, renal mass, chronic diastolic congestive heart failure, essential hypertension, anemia who presented to the ER at [facility] for progressive weakness.  Patient's creatinine was noted to be 6.7 which is increased from 5.6 to few weeks prior.  Due to worsening renal function with associated poor appetite and uremic symptoms, patient was transferred to [facility] for further evaluation and treatment.  In the month of October 2020 patient was admitted for acute renal failure in the setting of multiple myeloma had undergone 3 plasmapheresis treatment at the time. Patient states since then patient had not followed up with oncologist at [facility] and had proceeded to follow-up with [facility] oncologist again with nephrology follow-up.   Of note, pt did not desire further treatment for myeloma as of 11/20, however, now agreeable to full treatment, including plasmapheresis if indicated   Assessment & Plan:   Principal Problem:   ARF (acute renal failure) ([place]) Active Problems:   Essential (primary) hypertension   Paroxysmal atrial fibrillation (HCC)   Light chain myeloma (HCC)   CAD (coronary artery disease)   Chronic diastolic CHF (congestive heart failure) ([place])   Anemia   Ascending aortic aneurysm ([place])   Acute on chronic renal failure now progressed to ESRD Patient presenting as a transfer from [facility] for progressive weakness associated with further deterioration of his renal function.  Etiology likely secondary to his multiple myeloma and likely renal cell carcinoma.  Underwent IR placement of tunneled HD catheter on 01/24/2020 and underwent left AVF creation by vascular surgery, Dr. Donzetta Matters on 01/26/2020. --Nephrology following, appreciate assistance --Continue HD  per nephrology; plan outpatient TTS schedule at [facility]  Multiple myeloma Follows with medical oncology outpatient, Dr. Irene Limbo.  Medical oncology was consulted and was seen by Dr. Irene Limbo on 5/24.  Given his poor performance status and noncompliance he is a poor candidate for systemic myeloma treatment but if he is agreeable/tolerant with ongoing HD and has a clear plan to address his renal mass, oncology would consider restarting systemic myeloma treatment as outpatient if he read demonstrates compliance with treatment guidelines.  Left renal mass Highly suspicious for renal cell carcinoma, in the past has declined therapy.  CT abdomen notable for 8.7 cm left renal mass.  Patient has follow-up with [facility] in [facility] past, Dr. Louis Meckel.  Case was discussed during this hospitalization with urology, Dr. Jeffie Pollock; who recommended close outpatient follow-up following discharge.  Anemia likely secondary to chronic renal disease and multiple myeloma: Hemoglobin stable  History of proximal atrial fibrillation --Continue metoprolol tartrate 12.5 mg p.o. twice daily  Essential hypertension BP 143/82. --Continue amlodipine 10 mg p.o. daily metoprolol tartrate 12.5 mg p.o. BID  Ascending aortic aneurysm Noted mild dilation aortic root and of the ascending order measuring 48 mm. --Continue follow-up with cardiology, Dr. Percival Spanish  Chronic diastolic congestive heart failure, compensated TTE 07/28/2019, EF 17-61%, grade 1 diastolic dysfunction, moderately increased LVH, moderately dilated LV, severely dilated LA/RA, moderate MR, mild TR --Continue beta-blocker --Outpatient follow-up with cardiology  DVT prophylaxis: Heparin Code Status: Full code Family Communication: None present at bedside  Disposition Plan:  Status is: Inpatient  Remains inpatient appropriate because:Ongoing diagnostic testing needed not appropriate for outpatient work up, Unsafe d/c plan and IV treatments appropriate due to intensity  of illness or  inability to take PO   Dispo: The patient is from: Home              Anticipated d/c is to: Home              Anticipated d/c date is: 1 day              Patient currently is not medically stable to d/c.   Consultants:   Nephrology  Oncology  Discussed case with Urology  Vascular Surgery  Procedures:   Tunneled HD cath 5/24 by IR  Antimicrobials:   Perioperative vancomycin 5/26   Subjective: Patient seen and examined bedside, resting comfortably.  Recent return from the OR for AVF creation by vascular surgery.  No complaints at this time.  Denies headache, fever/chills/night sweats, no nausea/vomiting/diarrhea, no chest pain, no shortness of breath, no abdominal pain.  No acute events overnight per nursing staff.  Objective: Vitals:   01/26/20 1043 01/26/20 1045 01/26/20 1054 01/26/20 1106  BP: 119/74   128/74  Pulse: 60 62  64  Resp: 12 13  14   Temp:   98.4 F (36.9 C) 98.1 F (36.7 C)  TempSrc:    Oral  SpO2: 100% 100%  97%  Weight:      Height:        Intake/Output Summary (Last 24 hours) at 01/26/2020 1533 Last data filed at 01/26/2020 1056 Gross per 24 hour  Intake 15 ml  Output 525 ml  Net -510 ml   Filed Weights   01/25/20 1825 01/25/20 2148 01/26/20 0826  Weight: 88.6 kg 87.7 kg 87.7 kg    Examination:  General exam: Appears calm and comfortable  Respiratory system: Clear to auscultation. Respiratory effort normal. Cardiovascular system: S1 & S2 heard, RRR. No JVD, murmurs, rubs, gallops or clicks. No pedal edema. Gastrointestinal system: Abdomen is nondistended, soft and nontender. No organomegaly or masses felt. Normal bowel sounds heard. Central nervous system: Alert and oriented. No focal neurological deficits. Extremities: Symmetric 5 x 5 power. Skin: No rashes, lesions or ulcers Psychiatry: Judgement and insight appear normal. Mood & affect appropriate.     Data Reviewed: I have personally reviewed following labs and  imaging studies  CBC: Recent Labs  Lab 01/22/20 0556 01/23/20 0322 01/24/20 0211  WBC 5.1 5.3 5.8  NEUTROABS 3.4  --   --   HGB 8.2* 8.0* 8.3*  HCT 24.9* 23.9* 24.9*  MCV 94.7 93.4 94.3  PLT 213 215 892   Basic Metabolic Panel: Recent Labs  Lab 01/22/20 0556 01/23/20 0322 01/24/20 0211 01/25/20 0637 01/26/20 0348  NA 139 137 138 139 140  K 3.3* 3.1* 4.1 3.5 4.1  CL 103 106 101 104 99  CO2 22 20* 22 25 29   GLUCOSE 86 88 109* 167* 107*  BUN 90* 90* 95* 55* 34*  CREATININE 6.51* 6.31* 6.18* 4.08* 3.57*  CALCIUM 9.2 9.0 9.4 9.1 8.9  MG  --  1.9  --   --   --    GFR: Estimated Creatinine Clearance: 19.6 mL/min (A) (by C-G formula based on SCr of 3.57 mg/dL (H)). Liver Function Tests: Recent Labs  Lab 01/22/20 0556 01/23/20 0322  AST 16 12*  ALT 14 13  ALKPHOS 74 61  BILITOT 1.2 0.4  PROT 6.7 6.4*  ALBUMIN 3.4* 3.3*   No results for input(s): LIPASE, AMYLASE in the last 168 hours. No results for input(s): AMMONIA in the last 168 hours. Coagulation Profile: No results for input(s): INR, PROTIME in  the last 168 hours. Cardiac Enzymes: Recent Labs  Lab 01/22/20 0556  CKTOTAL 382   BNP (last 3 results) No results for input(s): PROBNP in the last 8760 hours. HbA1C: No results for input(s): HGBA1C in the last 72 hours. CBG: Recent Labs  Lab 01/24/20 1426 01/26/20 0251 01/26/20 1027  GLUCAP 73 120* 88   Lipid Profile: No results for input(s): CHOL, HDL, LDLCALC, TRIG, CHOLHDL, LDLDIRECT in the last 72 hours. Thyroid Function Tests: No results for input(s): TSH, T4TOTAL, FREET4, T3FREE, THYROIDAB in the last 72 hours. Anemia Panel: No results for input(s): VITAMINB12, FOLATE, FERRITIN, TIBC, IRON, RETICCTPCT in the last 72 hours. Sepsis Labs: No results for input(s): PROCALCITON, LATICACIDVEN in the last 168 hours.  Recent Results (from the past 240 hour(s))  SARS Coronavirus 2 by RT PCR (hospital order, performed in [facility] hospital lab)  Nasopharyngeal Nasopharyngeal Swab     Status: None   Collection Time: 01/22/20  6:01 AM   Specimen: Nasopharyngeal Swab  Result Value Ref Range Status   SARS Coronavirus 2 NEGATIVE NEGATIVE Final    Comment: (NOTE) SARS-CoV-2 target nucleic acids are NOT DETECTED. The SARS-CoV-2 RNA is generally detectable in upper and lower respiratory specimens during the acute phase of infection. The lowest concentration of SARS-CoV-2 viral copies this assay can detect is 250 copies / mL. A negative result does not preclude SARS-CoV-2 infection and should not be used as the sole basis for treatment or other patient management decisions.  A negative result may occur with improper specimen collection / handling, submission of specimen other than nasopharyngeal swab, presence of viral mutation(s) within the areas targeted by this assay, and inadequate number of viral copies (<250 copies / mL). A negative result must be combined with clinical observations, patient history, and epidemiological information. Fact Sheet for Patients:   StrictlyIdeas.no Fact Sheet for Healthcare Providers: BankingDealers.co.za This test is not yet approved or cleared  by the [place] FDA and has been authorized for detection and/or diagnosis of SARS-CoV-2 by FDA under an Emergency Use Authorization (EUA).  This EUA will remain in effect (meaning this test can be used) for the duration of the COVID-19 declaration under Section 564(b)(1) of the Act, 21 U.S.C. section 360bbb-3(b)(1), unless the authorization is terminated or revoked sooner. Performed at [facility], [place] [address] [place], [place] [address]          Radiology Studies: IR Fluoro Guide CV Line Right  Result Date: 01/24/2020 INDICATION: 74 year old male referred for tunneled hemodialysis catheter EXAM: TUNNELED CENTRAL VENOUS HEMODIALYSIS CATHETER PLACEMENT WITH ULTRASOUND AND FLUOROSCOPIC  GUIDANCE MEDICATIONS: 1 g vancomycin. The antibiotic was given in an appropriate time interval prior to skin puncture. ANESTHESIA/SEDATION: Moderate (conscious) sedation was employed during this procedure. A total of Versed 1.0 mg and Fentanyl 25 mcg was administered intravenously. Moderate Sedation Time: 14 minutes. The patient's level of consciousness and vital signs were monitored continuously by radiology nursing throughout the procedure under my direct supervision. FLUOROSCOPY TIME:  Fluoroscopy Time: 0 minutes 48 seconds (4 mGy). COMPLICATIONS: None PROCEDURE: Informed written consent was obtained from the patient after a discussion of the risks, benefits, and alternatives to treatment. Questions regarding the procedure were encouraged and answered. The right neck and chest were prepped with chlorhexidine in a sterile fashion, and a sterile drape was applied covering the operative field. Maximum barrier sterile technique with sterile gowns and gloves were used for the procedure. A timeout was performed prior to the initiation of the procedure.  Ultrasound survey was performed. Micropuncture kit was utilized to access the right internal jugular vein under direct, real-time ultrasound guidance after the overlying soft tissues were anesthetized with 1% lidocaine with epinephrine. Stab incision was made with 11 blade scalpel. Microwire was passed centrally. The microwire was then marked to measure appropriate internal catheter length. External tunneled length was estimated. A total tip to cuff length of 23 cm was selected. 035 guidewire was advanced to the level of the IVC. Skin and subcutaneous tissues of chest wall below the clavicle were generously infiltrated with 1% lidocaine for local anesthesia. A small stab incision was made with 11 blade scalpel. The selected hemodialysis catheter was tunneled in a retrograde fashion from the anterior chest wall to the venotomy incision. Serial dilation was performed and  then a peel-away sheath was placed. The catheter was then placed through the peel-away sheath with tips ultimately positioned within the superior aspect of the right atrium. Final catheter positioning was confirmed and documented with a spot radiographic image. The catheter aspirates and flushes normally. The catheter was flushed with appropriate volume heparin dwells. The catheter exit site was secured with a 0-Prolene retention suture. Gel-Foam slurry was infused into the soft tissue tract. The venotomy incision was closed Derma bond and sterile dressing. Dressings were applied at the chest wall. Patient tolerated the procedure well and remained hemodynamically stable throughout. No complications were encountered and no significant blood loss encountered. IMPRESSION: Status post right IJ tunneled hemodialysis catheter. Signed, Dulcy Fanny. Dellia Nims, RPVI [facility] Radiology Electronically Signed   By: Corrie Mckusick D.O.   On: 01/24/2020 17:27   IR US Guide Vasc Access Right  Result Date: 01/24/2020 INDICATION: 74 year old male referred for tunneled hemodialysis catheter EXAM: TUNNELED CENTRAL VENOUS HEMODIALYSIS CATHETER PLACEMENT WITH ULTRASOUND AND FLUOROSCOPIC GUIDANCE MEDICATIONS: 1 g vancomycin. The antibiotic was given in an appropriate time interval prior to skin puncture. ANESTHESIA/SEDATION: Moderate (conscious) sedation was employed during this procedure. A total of Versed 1.0 mg and Fentanyl 25 mcg was administered intravenously. Moderate Sedation Time: 14 minutes. The patient's level of consciousness and vital signs were monitored continuously by radiology nursing throughout the procedure under my direct supervision. FLUOROSCOPY TIME:  Fluoroscopy Time: 0 minutes 48 seconds (4 mGy). COMPLICATIONS: None PROCEDURE: Informed written consent was obtained from the patient after a discussion of the risks, benefits, and alternatives to treatment. Questions  regarding the procedure were encouraged and answered. The right neck and chest were prepped with chlorhexidine in a sterile fashion, and a sterile drape was applied covering the operative field. Maximum barrier sterile technique with sterile gowns and gloves were used for the procedure. A timeout was performed prior to the initiation of the procedure. Ultrasound survey was performed. Micropuncture kit was utilized to access the right internal jugular vein under direct, real-time ultrasound guidance after the overlying soft tissues were anesthetized with 1% lidocaine with epinephrine. Stab incision was made with 11 blade scalpel. Microwire was passed centrally. The microwire was then marked to measure appropriate internal catheter length. External tunneled length was estimated. A total tip to cuff length of 23 cm was selected. 035 guidewire was advanced to the level of the IVC. Skin and subcutaneous tissues of chest wall below the clavicle were generously infiltrated with 1% lidocaine for local anesthesia. A small stab incision was made with 11 blade scalpel. The selected hemodialysis catheter was tunneled in a retrograde fashion from the anterior chest wall to the venotomy incision. Serial dilation was  performed and then a peel-away sheath was placed. The catheter was then placed through the peel-away sheath with tips ultimately positioned within the superior aspect of the right atrium. Final catheter positioning was confirmed and documented with a spot radiographic image. The catheter aspirates and flushes normally. The catheter was flushed with appropriate volume heparin dwells. The catheter exit site was secured with a 0-Prolene retention suture. Gel-Foam slurry was infused into the soft tissue tract. The venotomy incision was closed Derma bond and sterile dressing. Dressings were applied at the chest wall. Patient tolerated the procedure well and remained hemodynamically stable throughout. No complications were  encountered and no significant blood loss encountered. IMPRESSION: Status post right IJ tunneled hemodialysis catheter. Signed, Dulcy Fanny. Dellia Nims, RPVI [facility] Radiology Electronically Signed   By: Corrie Mckusick D.O.   On: 01/24/2020 17:27        Scheduled Meds: . amLODipine  10 mg Oral Daily  . atorvastatin  40 mg Oral Daily  . Chlorhexidine Gluconate Cloth  6 each Topical Q0600  . docusate sodium  100 mg Oral Daily  . feeding supplement (NEPRO CARB STEADY)  237 mL Oral BID BM  . heparin  5,000 Units Subcutaneous Q8H  . metoprolol tartrate  12.5 mg Oral BID  . multivitamin  1 tablet Oral QHS  . tamsulosin  0.4 mg Oral Daily   Continuous Infusions:   LOS: 4 days    Time spent: 36 minutes spent on chart review, discussion with nursing staff, consultants, updating family and interview/physical exam; more than 50% of that time was spent in counseling and/or coordination of care.    Lashawn Bromwell J [place] DO [facility] Available via Epic secure chat 7am-7pm After these hours, please refer to coverage provider listed on [email or web address] 01/26/2020, 3:33 PM

## 2020-01-26 NOTE — Anesthesia Postprocedure Evaluation (Signed)
Anesthesia Post Note  Patient: Yadir A Borner  Procedure(s) Performed: left ARTERIOVENOUS (AV) FISTULA CREATION (Left Arm Upper)     Patient location during evaluation: PACU Anesthesia Type: MAC Level of consciousness: awake and alert Pain management: pain level controlled Vital Signs Assessment: post-procedure vital signs reviewed and stable Respiratory status: spontaneous breathing, nonlabored ventilation, respiratory function stable and patient connected to nasal cannula oxygen Cardiovascular status: stable and blood pressure returned to baseline Postop Assessment: no apparent nausea or vomiting Anesthetic complications: no    Last Vitals:  Vitals:   01/26/20 1054 01/26/20 1106  BP:  128/74  Pulse:  64  Resp:  14  Temp: 36.9 C 36.7 C  SpO2:  97%    Last Pain:  Vitals:   01/26/20 1106  TempSrc: Oral  PainSc:                  Keelan Tripodi COKER

## 2020-01-26 NOTE — Op Note (Signed)
    Patient name: [name] [name] MRN: [id number] DOB: [birth year] Sex: male  01/26/2020 Pre-operative Diagnosis: End-stage renal disease Post-operative diagnosis:  Same Surgeon:  Eda Paschal. Donzetta Matters, MD Procedure Performed:   Left arm first stage basilic vein fistula creation   Indications: 74 year old male now with end-stage renal disease has a catheter in place.  He is indicated for permanent access and has possible basilic vein on the left versus will need graft.  Findings: Basilic vein above the antecubitum measured 4 mm and was free of any disease.  Brachial artery was a centimeter in diameter was also free of disease.  At completion there was a strong thrill in the basilic vein and a palpable radial artery pulse the wrist.   Procedure:  The patient was identified in the holding area and taken to the operating where is put supine operative 1 MAC anesthesia induced.  He was sterilely prepped and draped in the left upper extremity usual fashion antibiotics were administered timeout was called.  Ultrasound was used to identify a suitable appearing basilic vein.  There is anesthetized 1% lidocaine.  Transverse incision was made overlying this in the palpable brachial artery pulse above the antecubitum.  We first dissected out the vein we protected the nerve.  We divided a branch between ties.  Vein was marked for orientation.  We dissected through the deep fascia of the brachial artery a vessel loop was placed around this.  The vein was then transected distally flushed heparinized saline spatulated and clamped.  The arteries clamped distally and proximally opened longitudinally.  Given the size we only flush heparinized saline distally.  We then sewed the vein end-to-side with 6-0 Prolene suture.  Prior completion of the flushing directions.  Upon completion there was very strong thrill in the vein confirmed with Doppler.  There was a palpable radial artery pulse at the wrist.  Satisfied we obtain hemostasis  irrigated wound closed in layers of Vicryl and Monocryl.  Dermabond placed to the level the skin.  He was awakened anesthesia having tolerated procedure well that immediate complication but all counts were correct at completion.  EBL: 20 cc    Kealii Thueson C. Donzetta Matters, MD [facility] Office: [phone] Pager: [phone]

## 2020-01-26 NOTE — Anesthesia Preprocedure Evaluation (Signed)
Anesthesia Evaluation  Patient identified by MRN, date of birth, ID band Patient awake    Reviewed: Allergy & Precautions, NPO status , Patient's Chart, lab work & pertinent test results  Airway Mallampati: II  TM Distance: >3 FB     Dental  (+)    Pulmonary former smoker,    breath sounds clear to auscultation       Cardiovascular  Rhythm:Irregular Rate:Normal     Neuro/Psych    GI/Hepatic   Endo/Other  diabetes  Renal/GU      Musculoskeletal   Abdominal   Peds  Hematology   Anesthesia Other Findings   Reproductive/Obstetrics                             Anesthesia Physical Anesthesia Plan  ASA: III  Anesthesia Plan: MAC   Post-op Pain Management:    Induction: Intravenous  PONV Risk Score and Plan: Ondansetron and Propofol infusion  Airway Management Planned: Natural Airway and Simple Face Mask  Additional Equipment:   Intra-op Plan:   Post-operative Plan:   Informed Consent: I have reviewed the patients History and Physical, chart, labs and discussed the procedure including the risks, benefits and alternatives for the proposed anesthesia with the patient or authorized representative who has indicated his/her understanding and acceptance.     Dental advisory given  Plan Discussed with: CRNA and Anesthesiologist  Anesthesia Plan Comments:         Anesthesia Quick Evaluation

## 2020-01-26 NOTE — Progress Notes (Addendum)
Nutrition Follow-up  DOCUMENTATION CODES:   Not applicable  INTERVENTION:   -D/c Ensure Enlive po BID, each supplement provides 350 kcal and 20 grams of protein -Continue renal MVI daily -Nepro Shake po BID, each supplement provides 425 kcal and 19 grams protein -Provided "Food Pyramid for Healthy Eating with Kidney Disease" handout; attached to AVS/ discharge summary  NUTRITION DIAGNOSIS:   Inadequate oral intake related to poor appetite, acute illness as evidenced by per patient/family report.  Ongoing  GOAL:   Patient will meet greater than or equal to 90% of their needs  Progressng  MONITOR:   PO intake, Supplement acceptance, Labs, Weight trends  REASON FOR ASSESSMENT:   Malnutrition Screening Tool    ASSESSMENT:   73 yo admitted with AKI on CKD with hx of multiple myeloma previously requiring plasmapheresis on October 2020. PMH includes HTN, CKD III, CHF, renal mass.  5/24- s/p Placement of a right IJ approach 23cm tip to cuff HD catheter 5/26- s/p Left arm first stage basilic vein fistula creation   Reviewed I/O's: -500 ml x 24 hours and -2.4 L since admission  Spoke with pt at bedside, who was sleepy at time of visit. Pt shares that he feels hungry and tried secondary to HD and multiple procedures over the past few days. His appetite is good and is requesting food due to being NPO for procedure this morning (tray arrived during RD visit and pt already consumed Ensure supplement prior to visit). Assisted pt with tray set up.   Pt reports poor oral intake PTA, secondary to weakness and not feeling up to preparing meals. Per his report, he used to receive Meals on Wheels, however, this service stopped which the pandemic started. He has spoken the the Education officer, museum, who reports he can pick up his meals, which he is willing to do.   Pt endorses weight loss, but is unsure how much weight he lost or what his UBW is. He thinks he has started losing weight over the past  6 months, related to multiple myeloma and current acute illness.   Discussed importance of good meal and supplement intake to promote healing- he is requesting Nepro supplements. Attempted to educate pt on renal diet, however, pt was easily distractible.    Labs reviewed: CBGS: 88-120.  NUTRITION - FOCUSED PHYSICAL EXAM:    Most Recent Value  Orbital Region  No depletion  Upper Arm Region  No depletion  Thoracic and Lumbar Region  No depletion  Buccal Region  No depletion  Temple Region  Mild depletion  Clavicle Bone Region  No depletion  Clavicle and Acromion Bone Region  No depletion  Scapular Bone Region  No depletion  Dorsal Hand  No depletion  Patellar Region  No depletion  Anterior Thigh Region  No depletion  Posterior Calf Region  No depletion  Edema (RD Assessment)  Mild  Hair  Reviewed  Eyes  Reviewed  Mouth  Reviewed  Skin  Reviewed  Nails  Reviewed       Diet Order:   Diet Order            Diet renal with fluid restriction Fluid restriction: 1200 mL Fluid; Room service appropriate? Yes; Fluid consistency: Thin  Diet effective now              EDUCATION NEEDS:   Not appropriate for education at this time  Skin:  Skin Assessment: Skin Integrity Issues: Skin Integrity Issues:: Incisions Incisions: closed lt arm  Last BM:  01/22/20  Height:   Ht Readings from Last 1 Encounters:  01/26/20 5' 9"  (1.753 m)    Weight:   Wt Readings from Last 1 Encounters:  01/26/20 87.7 kg    Ideal Body Weight:  72.7 kg  BMI:  Body mass index is 28.55 kg/m.  Estimated Nutritional Needs:   Kcal:  2100-2300  Protein:  105-120 grams  Fluid:  1000 ml + UOP    Loistine Chance, RD, LDN, [place] Registered Dietitian II Certified Diabetes Care and Education Specialist Please refer to [facility] for RD and/or RD on-call/weekend/after hours pager

## 2020-01-26 NOTE — Progress Notes (Signed)
   I have reviewed imaging and discussed with patient. Plan for left arm avf vs more likely graft in OR today.   Brandon C. Donzetta Matters, MD [facility] Office: [phone] Pager: [phone]

## 2020-01-26 NOTE — Consult Note (Signed)
Consultation Note Date: 01/26/2020   Patient Name: [name] [name]  DOB: 06/12/1946  MRN: [id number]  Age / Sex: 74 y.o., male  PCP: [name] Ebbs, MD Referring Physician: [place] [name] J, DO  Reason for Consultation: Establishing goals of care and Psychosocial/spiritual support  HPI/Patient Profile: 74 y.o. male  with past medical history of multiple myeloma, CAD, CHF, history of colon cancer, COPD, DM 2, HTN/HLD, moderate aortic insufficiency, A. fib, prostate cancer, renal cell carcinoma, venous insufficiency, spinal stenosis cervical region, BPH, admitted on 01/22/2020 with acute renal failure on chronic kidney disease.   Clinical Assessment and Goals of Care: I have reviewed medical records including EPIC notes, labs and imaging, received report from transition of care team, examined the patient and met with patient at bedside  to discuss diagnosis prognosis, GOC, EOL wishes, disposition and options.   [name] [name] is standing up beside the bed in his room.  He greets me making and somewhat keeping eye contact.  He tells me that he tries to keep active.  I introduced Palliative Medicine as specialized medical care for people living with serious illness. It focuses on providing relief from the symptoms and stress of a serious illness.   We discussed a brief life review of the patient.  [name] [name] tells me he worked in Architect, Print production planner, many different types of jobs.  He lives in [place] for some time while he and his wife were raising their children.  He has 6 children, 4 boys and 2 girls.  His wife was on dialysis for 16 years.  She died in 11-25-13.  As far as functional and nutritional status, [name] [name] is independent with IADLs.  He does have help from his children at times.  He tells me that he was sick last year, and had groceries delivered to his door by local grocery store.  We discussed current illness and  what it means in the larger context of on-going co-morbidities.  Natural disease trajectory and expectations at EOL were discussed.  I attempted to elicit values and goals of care important to the patient.  [name] [name] talks repeatedly about his face and miracles he has experienced.    Advanced directives, concepts specific to code status, artifical feeding and hydration, and rehospitalization were considered and discussed.  [name] [name] tells me that he would want life support if needed.  He tells me that he does not believe that he would be on life support long because of all the people praying for him.  He tells me that his goal is to live as long as possible.  Hospice and Palliative Care services outpatient were explained and offered.  [name] [name] was agreeable to outpatient palliative services to continue goals of care discussion.  Questions and concerns were addressed.    Conference with transition of care team related to patient condition, needs, anticipated discharge tomorrow, outpatient palliative to follow.   HCPOA    NEXT OF KIN -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  He tells me that he has 6 children, 4 boys and 2 girls.  His wife died in 11-03-13 after 16 years of hemodialysis    SUMMARY OF RECOMMENDATIONS   At this point full scope/full code Intubation for as long as needed, longer life is important at any cost Agreeable to cancer treatments as offered at this point Rehospitalize as needed   Code Status/Advance Care Planning:  Full code -[name] [name] tells me that he is expecting a miracle.  That if he needed life support it would not be for long because of all the people praying for him.  Symptom Management:   Per hospitalist, no additional needs at this time.  Palliative Prophylaxis:   No special needs at this time  Additional Recommendations (Limitations, Scope, Preferences):  Full Scope Treatment  Psycho-social/Spiritual:    Desire for further Chaplaincy support:no  Additional Recommendations: Caregiving  Support/Resources and Education on Hospice  Prognosis:   Unable to determine, 6 months or less would not be surprising based on chronic illness burden, new to hemodialysis, cancer burden, weight loss for approximately 17 pounds in 3 months.  Discharge Planning: Home with outpatient palliative services to follow.      Primary Diagnoses: Present on Admission: . ARF (acute renal failure) ([place]) . Paroxysmal atrial fibrillation (H[facility]) . CAD (coronary artery disease) . Chronic diastolic CHF (congestive heart failure) ([place]) . Essential (primary) hypertension . 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 Anemia . Ascending aortic aneurysm ([place])   I have reviewed the medical record, interviewed the patient and family, and examined the patient. The following aspects are pertinent.  Past Medical History:  Diagnosis Date  . Abnormal CT of liver    a. nodular contour suggesting cirrhosis 05/2018.  [name] Abnormal LFTs (liver function tests)   . Anxiety   . Aortic root dilatation ([place])   . Ascending aortic aneurysm ([place])   . BPH with urinary obstruction   . Bradycardia   . 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 CAD (coronary artery disease)    a. 12/2015 NSTEMI/Cath (in setting of PAF):  LM nl, LAD 30p,  LCX 79m RCA  ok, AM 100%, RPDA1 40, RPDA 2 60 ->Med Rx.  . Childhood asthma   . Chronic diastolic CHF (congestive heart failure) ([facility]    a. 12/2015 Echo: EF 50-55%, mod AI, mod Ao root dil, mild MR, mod dil LA, mod RA.  . CKD (chronic kidney disease), stage III   . Colon cancer ([facility]   . COPD (chronic obstructive pulmonary disease) ([facility]    pt denies at preop  . Diabetes mellitus type II    diet controlled  . History of syncope   . Hyperlipidemia   . Hypertensive heart disease   . Kidney lump 04/04/2010   Overview:  Renal Cell Carcinoma    . Light chain myeloma ([facility]   . Moderate aortic insufficiency    a. 12/2015 Echo: Mod AI.  .Marland KitchenParoxysmal atrial fibrillation ([facility]    a. 12/2015 started on Xarelto (CHA2DS2VASc = 4-5).  . Pneumonia   . Prostate cancer ([facility]   . Renal cell carcinoma ([facility]   . Sleep apnea    Does not like  CPAP  . Spinal stenosis in cervical region 10/12/2016  . Venous insufficiency    Social History   Socioeconomic History  . Marital status: Widowed    Spouse name: Not on file  . Number of children: Not  on file  . Years of education: Not on file  . Highest education level: Not on file  Occupational History  . Not on file  Tobacco Use  . Smoking status: Former Smoker    Types: Cigars    Quit date: 09/02/1977    Years since quitting: 42.4  . Smokeless tobacco: Never Used  . Tobacco comment:    Substance and Sexual Activity  . Alcohol use: Not Currently    Comment: "stopped drinking alcohol in ~ 1980; just drank a little on the weekends when I did drink"  . Drug use: No  . Sexual activity: Not Currently  Other Topics Concern  . Not on file  Social History Narrative   Lives alone.  Wife died in 04/17/2023.  Lives in apartment.     Social Determinants of Health   Financial Resource Strain:   . Difficulty of Paying Living Expenses:   Food Insecurity:   . Worried About Charity fundraiser in the Last Year:   . Arboriculturist in the Last Year:   Transportation Needs:   . Film/video editor (Medical):   [name] Lack of Transportation (Non-Medical):   Physical Activity:   . Days of Exercise per Week:   . Minutes of Exercise per Session:   Stress:   . Feeling of Stress :   Social Connections:   . Frequency of Communication with Friends and Family:   . Frequency of Social Gatherings with Friends and Family:   . Attends Religious Services:   . Active Member of Clubs or Organizations:   . Attends Archivist Meetings:   [name] Marital Status:    Family History  Problem Relation Age of Onset   . Emphysema Father   . Asthma Father   . Liver disease Father        tumor  . Heart disease Mother   . Hypertension Mother   . Asthma Sister   . Hypertension Son    Scheduled Meds: . amLODipine  10 mg Oral Daily  . atorvastatin  40 mg Oral Daily  . Chlorhexidine Gluconate Cloth  6 each Topical Q0600  . docusate sodium  100 mg Oral Daily  . feeding supplement (ENSURE ENLIVE)  237 mL Oral BID BM  . heparin  5,000 Units Subcutaneous Q8H  . metoprolol tartrate  12.5 mg Oral BID  . multivitamin  1 tablet Oral QHS  . tamsulosin  0.4 mg Oral Daily   Continuous Infusions: PRN Meds:.ondansetron **OR** ondansetron (ZOFRAN) IV, oxyCODONE Medications Prior to Admission:  Prior to Admission medications   Medication Sig Start Date End Date Taking? Authorizing Provider  amLODipine (NORVASC) 10 MG tablet Take 1 tablet (10 mg total) by mouth daily. 01/12/20  Yes [name], DO  atorvastatin (LIPITOR) 40 MG tablet Take 40 mg by mouth daily.   Yes [provider]  cetirizine (ZYRTEC) 10 MG tablet TAKE 1 TABLET (10 MG TOTAL) BY MOUTH DAILY AS NEEDED (SEASONAL ALLERGIES). Patient taking differently: Take 10 mg by mouth daily as needed for allergies (seasonal allergies).  11/12/19  Yes [name], DO  Cholecalciferol (VITAMIN D3 PO) Take 1 tablet by mouth daily.   Yes [provider]  Cyanocobalamin (VITAMIN B-12 PO) Take 1 tablet by mouth daily.   Yes [provider]  HYDROcodone-acetaminophen (NORCO/VICODIN) 5-325 MG tablet Take 1-2 tablets by mouth every 6 (six) hours as needed (gout pain). 12/29/19  Yes Orvan July, NP  Omega-3 Fatty Acids (  FISH OIL PO) Take 1 capsule by mouth daily.    Yes [provider]  tamsulosin (FLOMAX) 0.4 MG CAPS capsule Take 1 capsule (0.4 mg total) by mouth daily. 10/15/19  Yes [name], DO  glucose blood (ONETOUCH ULTRA) test strip Check FSBS twice a day. Dx: E11.21, N18.32 08/16/19   Antony Blackbird, MD   Allergies  Allergen Reactions  . Amoxicillin Other (See Comments)    Tolerates Unasyn. Can't move Has patient had a PCN reaction causing immediate rash, facial/tongue/throat swelling, SOB or lightheadedness with hypotension: No Has patient had a PCN reaction causing severe rash involving mucus membranes or skin necrosis: No Has patient had a PCN reaction that required hospitalization No Has patient had a PCN reaction occurring within the last 10 years: No If all of the above answers are "NO", then may proceed with Cephalosporin use.   [name] Penicillins Other (See Comments)    Tolerates Unasyn. Can't move/dizziness Has patient had a PCN reaction causing immediate rash, facial/tongue/throat swelling, SOB or lightheadedness with hypotension: No Has patient had a PCN reaction causing severe rash involving mucus membranes or skin necrosis: No Has patient had a PCN reaction that required hospitalization No Has patient had a PCN reaction occurring within the last 10 years: No If all of the above answers are "NO", then may proceed with Cephalosporin use.     Review of Systems  Unable to perform ROS: Other    Physical Exam Vitals and nursing note reviewed.  Constitutional:      General: He is not in acute distress.    Appearance: He is normal weight. He is ill-appearing.     Comments: Makes and somewhat keeps eye contact  HENT:     Head: Normocephalic and atraumatic.     Mouth/Throat:     Mouth: Mucous membranes are moist.  Cardiovascular:     Rate and Rhythm: Normal rate.  Pulmonary:     Effort: Pulmonary effort is normal. No respiratory distress.  Abdominal:     Tenderness: There is no abdominal tenderness. There is no guarding.     Hernia: A hernia is present.  Musculoskeletal:        General: No swelling.  Skin:    General: Skin is warm and dry.  Neurological:     Mental Status: He is alert and oriented to person, place, and time. Mental status is at baseline.    Psychiatric:     Comments: Calm and cooperative, hard of hearing,     Vital Signs: BP 128/74 (BP Location: Right Arm)   Pulse 64   Temp 98.1 F (36.7 C) (Oral)   Resp 14   Ht 5' 9"  (1.753 m)   Wt 87.7 kg   SpO2 97%   BMI 28.55 kg/m  Pain Scale: 0-10 POSS *See Group Information*: 1-Acceptable,Awake and alert Pain Score: Asleep   SpO2: SpO2: 97 % O2 Device:SpO2: 97 % O2 Flow Rate: .O2 Flow Rate (L/min): 2 L/min  IO: Intake/output summary:   Intake/Output Summary (Last 24 hours) at 01/26/2020 1313 Last data filed at 01/26/2020 1056 Gross per 24 hour  Intake 15 ml  Output 525 ml  Net -510 ml    LBM: Last BM Date: 01/22/20 Baseline Weight: Weight: 86.8 kg Most recent weight: Weight: 87.7 kg     Palliative Assessment/Data:   Flowsheet Rows     Most Recent Value  Intake Tab  Referral Department  Hospitalist  Unit at Time of Referral  Med/Surg Unit  Palliative Care Primary Diagnosis  Nephrology  Date Notified  01/25/20  Palliative Care Type  New Palliative care  Reason for referral  Clarify Goals of Care  Date of Admission  01/22/20  Date first seen by Palliative Care  01/26/20  # of days Palliative referral response time  1 Day(s)  # of days IP prior to Palliative referral  3  Clinical Assessment  Palliative Performance Scale Score  40%  Pain Max last 24 hours  Not able to report  Pain Min Last 24 hours  Not able to report  Dyspnea Max Last 24 Hours  Not able to report  Dyspnea Min Last 24 hours  Not able to report  Psychosocial & Spiritual Assessment  Palliative Care Outcomes      Time In: 1340 Time Out: 1430 Time Total: 50 minutes  Greater than 50%  of this time was spent counseling and coordinating care related to the above assessment and plan.  Signed by: Drue Novel, NP   Please contact Palliative Medicine Team phone at [phone] for questions and concerns.  For individual provider: See Shea Evans

## 2020-01-26 NOTE — Anesthesia Procedure Notes (Signed)
Procedure Name: MAC Date/Time: 01/26/2020 9:26 AM Performed by: Mariea Clonts, CRNA Pre-anesthesia Checklist: Patient identified, Emergency Drugs available, Suction available, Patient being monitored and Timeout performed Patient Re-evaluated:Patient Re-evaluated prior to induction Oxygen Delivery Method: Nasal cannula and Simple face mask

## 2020-01-26 NOTE — Progress Notes (Signed)
VASCULAR SURGERY:  For left AV graft today.  Based on his vein map back in October 2020 his only option for a fistula on the left with the basilic vein transposition which would likely be done in 2 stages.  He had no options for a fistula in the right arm.  Deitra Mayo, MD Office: [phone]

## 2020-01-26 NOTE — Transfer of Care (Signed)
Immediate Anesthesia Transfer of Care Note  Patient: [name] [name]  Procedure(s) Performed: left ARTERIOVENOUS (AV) FISTULA CREATION (Left Arm Upper)  Patient Location: PACU  Anesthesia Type:MAC  Level of Consciousness: awake, alert  and oriented  Airway & Oxygen Therapy: Patient Spontanous Breathing and Patient connected to nasal cannula oxygen  Post-op Assessment: Report given to RN, Post -op Vital signs reviewed and stable and Patient moving all extremities X 4  Post vital signs: Reviewed and stable  Last Vitals:  Vitals Value Taken Time  BP 118/70 01/26/20 1028  Temp    Pulse 65 01/26/20 1029  Resp 15 01/26/20 1029  SpO2 96 % 01/26/20 1029    Last Pain:  Vitals:   01/26/20 0526  TempSrc: Oral  PainSc:       Patients Stated Pain Goal: 0 (95/28/41 3244)  Complications: No apparent anesthesia complications

## 2020-01-26 NOTE — Progress Notes (Signed)
[facility] Progress Note  Subjective: s/p L BB 1st stage AVF this AM, sleepy  Vitals:   01/26/20 1043 01/26/20 1045 01/26/20 1054 01/26/20 1106  BP: 119/74   128/74  Pulse: 60 62  64  Resp: 12 13  14   Temp:   98.4 F (36.9 C) 98.1 F (36.7 C)  TempSrc:    Oral  SpO2: 100% 100%  97%  Weight:      Height:        Exam: Gen alert, calm  Chest clear bilat to bases RRR no RG, soft 1/6 sem Ext no edema Neuro sleepy post op  Date                             Creat                           eGFR 2015- 18                      0.80- 1.10 Sept-Dec 2019            5.99 >> 2.10                Severe AKI Feb- mar 2020            1.5- 1.7                        45- 50 Oct 2020                     6.11 >> 5.29                AKI episode Nov                              3.7- 4.09 Aug 2019                     4.10                             15- 17 Jan                              3.82                             17 Feb                              4.41 April                             4.9- 5.10                      12 Jan 08 2020                 5.63  11 Jan 22 2020               6.51                             9   Home meds:  - norvasc 10/ lipitor 40  - norco prn/ flomax 0.4 qd  - prn's/ vitamins/ supplements  UA 5/22 - negative, prot 30   Assessment/ Plan: 1. ESRD - Presumed due to multiple myeloma.  Pt wants to proceed with dialysis (in the past was not sure). He has uremic symptoms. [place] placed IR 5/24. L AVF placed 5/26, appreciate VVS.  CLIP to [facility] TTS.  We will complete a 3rd HD tomorrow here.    2. Multiple myeloma - f/b ONC - Dr. Irene Limbo has seen during admission, appreciate thoughtful note. If RCC untreated not likely going to proceed with systemic chemo for MM. 3. L renal mass - seen by urology in Oct 2020, suspected RCC.  In past pt has declined therapy - pall care called to help outline GOC.  4. BP/ vol -  cont norvasc, mild LE edema on exam otherwise euvolemic.  5. BPH on flomax 6. Anemia ckd +/- malignancy - Hb 8.2 , tsat ok at 40%,  Hold on ESA in light of likely RCC until clarify further.    [name] Hick MD [facility] Pager [phone]   Recent Labs  Lab 01/23/20 [phone] 01/23/20 6381 01/24/20 0211 01/24/20 0211 01/25/20 0637 01/26/20 0348  K 3.1*   < > 4.1   < > 3.5 4.1  BUN 90*   < > 95*   < > 55* 34*  CREATININE 6.31*   < > 6.18*   < > 4.08* 3.57*  CALCIUM 9.0   < > 9.4   < > 9.1 8.9  HGB 8.0*  --  8.3*  --   --   --    < > = values in this interval not displayed.   Inpatient medications: . amLODipine  10 mg Oral Daily  . atorvastatin  40 mg Oral Daily  . Chlorhexidine Gluconate Cloth  6 each Topical Q0600  . docusate sodium  100 mg Oral Daily  . feeding supplement (ENSURE ENLIVE)  237 mL Oral BID BM  . heparin  5,000 Units Subcutaneous Q8H  . metoprolol tartrate  12.5 mg Oral BID  . multivitamin  1 tablet Oral QHS  . tamsulosin  0.4 mg Oral Daily    ondansetron **OR** ondansetron (ZOFRAN) IV, oxyCODONE

## 2020-01-26 NOTE — Discharge Instructions (Signed)
[facility]  Discharge Instructions  AV Fistula or Graft Surgery for Dialysis Access  Please refer to the following instructions for your post-procedure care. Your surgeon or physician assistant will discuss any changes with you.  Activity  You may drive the day following your surgery, if you are comfortable and no longer taking prescription pain medication. Resume full activity as the soreness in your incision resolves.  Bathing/Showering  You may shower after you go home. Keep your incision dry for 48 hours. Do not soak in a bathtub, hot tub, or swim until the incision heals completely. You may not shower if you have a hemodialysis catheter.  Incision Care  Clean your incision with mild soap and water after 48 hours. Pat the area dry with a clean towel. You do not need a bandage unless otherwise instructed. Do not apply any ointments or creams to your incision. You may have skin glue on your incision. Do not peel it off. It will come off on its own in about one week. Your arm may swell a bit after surgery. To reduce swelling use pillows to elevate your arm so it is above your heart. Your doctor will tell you if you need to lightly wrap your arm with an ACE bandage.  Diet  Resume your normal diet. There are not special food restrictions following this procedure. In order to heal from your surgery, it is CRITICAL to get adequate nutrition. Your body requires vitamins, minerals, and protein. Vegetables are the best source of vitamins and minerals. Vegetables also provide the perfect balance of protein. Processed food has little nutritional value, so try to avoid this.  Medications  Resume taking all of your medications. If your incision is causing pain, you may take over-the counter pain relievers such as acetaminophen (Tylenol). If you were prescribed a stronger pain medication, please be aware these medications can cause nausea and constipation. Prevent  nausea by taking the medication with a snack or meal. Avoid constipation by drinking plenty of fluids and eating foods with high amount of fiber, such as fruits, vegetables, and grains.  Do not take Tylenol if you are taking prescription pain medications.  Follow up Your surgeon may want to see you in the office following your access surgery. If so, this will be arranged at the time of your surgery.  Please call us immediately for any of the following conditions:  . Increased pain, redness, drainage (pus) from your incision site . Fever of 101 degrees or higher . Severe or worsening pain at your incision site . Hand pain or numbness. .  Reduce your risk of vascular disease:  . Stop smoking. If you would like help, call [facility] at 1-800-QUIT-NOW 587-225-0912) or [place] at [phone]  . Manage your cholesterol . Maintain a desired weight . Control your diabetes . Keep your blood pressure down  Dialysis  It will take several weeks to several months for your new dialysis access to be ready for use. Your surgeon will determine when it is okay to use it. Your nephrologist will continue to direct your dialysis. You can continue to use your Permcath until your new access is ready for use.   01/26/2020 [name] [name] [id number] Feb 02, 1946  Surgeon(s): Waynetta Sandy, MD  Procedure(s): ARTERIOVENOUS (AV) FISTULA CREATION VERSUS ARTERIOVENOUS GRAFT   May stick graft immediately   May stick graft on designated area only:   X Do not stick left AV fistula for 12 weeks  If you have any questions, please call the office at [phone].

## 2020-01-26 NOTE — Progress Notes (Signed)
Renal Navigator spoke with patient's son/Shelton to discuss tentative plan for discharge tomorrow. Patient should have had 3 HD treatments in the hospital by discharge and then will start in the clinic on Saturday, 01/29/20. In order to start on Saturday, he needs to sign papers Friday. [name] states understanding and assures Navigator that they will be able to get patient to treatment either by private vehicle or by arranging Medicaid Transportation with phone number previously provided by Navigator.  Alphonzo Cruise, [place] Renal Navigator [phone]

## 2020-01-27 ENCOUNTER — Telehealth: Payer: Self-pay | Admitting: Hematology

## 2020-01-27 DIAGNOSIS — Z992 Dependence on renal dialysis: Secondary | ICD-10-CM

## 2020-01-27 DIAGNOSIS — Z7189 Other specified counseling: Secondary | ICD-10-CM

## 2020-01-27 DIAGNOSIS — N186 End stage renal disease: Secondary | ICD-10-CM

## 2020-01-27 DIAGNOSIS — Z515 Encounter for palliative care: Secondary | ICD-10-CM

## 2020-01-27 LAB — RENAL FUNCTION PANEL
Albumin: 3 g/dL — ABNORMAL LOW (ref 3.5–5.0)
Anion gap: 12 (ref 5–15)
BUN: 56 mg/dL — ABNORMAL HIGH (ref 8–23)
CO2: 25 mmol/L (ref 22–32)
Calcium: 9.5 mg/dL (ref 8.9–10.3)
Chloride: 98 mmol/L (ref 98–111)
Creatinine, Ser: 6.19 mg/dL — ABNORMAL HIGH (ref 0.61–1.24)
GFR calc Af Amer: 9 mL/min — ABNORMAL LOW (ref >60)
GFR calc non Af Amer: 8 mL/min — ABNORMAL LOW (ref >60)
Glucose, Bld: 166 mg/dL — ABNORMAL HIGH (ref 70–99)
Phosphorus: 4 mg/dL (ref 2.5–4.6)
Potassium: 4 mmol/L (ref 3.5–5.1)
Sodium: 135 mmol/L (ref 135–145)

## 2020-01-27 LAB — CBC
HCT: 22.8 % — ABNORMAL LOW (ref 39.0–52.0)
Hemoglobin: 7.4 g/dL — ABNORMAL LOW (ref 13.0–17.0)
MCH: 31.9 pg (ref 26.0–34.0)
MCHC: 32.5 g/dL (ref 30.0–36.0)
MCV: 98.3 fL (ref 80.0–100.0)
Platelets: 179 10*3/uL (ref 150–400)
RBC: 2.32 MIL/uL — ABNORMAL LOW (ref 4.22–5.81)
RDW: 14.9 % (ref 11.5–15.5)
WBC: 5.6 10*3/uL (ref 4.0–10.5)
nRBC: 0 % (ref 0.0–0.2)

## 2020-01-27 MED ORDER — OXYCODONE HCL 5 MG PO TABS
ORAL_TABLET | ORAL | Status: AC
  Filled 2020-01-27: qty 1

## 2020-01-27 MED ORDER — RENA-VITE PO TABS: 1.0000 | ORAL_TABLET | Freq: Every day | ORAL | 0 refills | Status: DC

## 2020-01-27 MED ORDER — METOPROLOL TARTRATE 25 MG PO TABS: 12.5000 mg | ORAL_TABLET | Freq: Two times a day (BID) | ORAL | 0 refills | Status: DC

## 2020-01-27 NOTE — Progress Notes (Signed)
[facility] Progress Note  Subjective: seen on HD today, feeling fine, plan is to d/c today  Vitals:   01/27/20 0915 01/27/20 0930 01/27/20 1000 01/27/20 1030  BP: (!) 156/88 (!) 156/88 133/85 (!) 143/88  Pulse: 73 73 73 78  Resp:      Temp:      TempSrc:      SpO2:      Weight:      Height:        Exam: Gen alert, calm  Chest clear bilat to bases RRR no RG, soft 1/6 sem Ext no edema, LUE AF +t/b, distal pulse intact Neuro sleeping, awakens easily  Date                             Creat                           eGFR 2015- 18                      0.80- 1.10 Sept-Dec 2019            5.99 >> 2.10                Severe AKI Feb- mar 2020            1.5- 1.7                        45- 50 Oct 2020                     6.11 >> 5.29                AKI episode Nov                              3.7- 4.09 Aug 2019                     4.10                             15- 17 Jan                              3.82                             17 Feb                              4.41 April                             4.9- 5.10                      12 Jan 08 2020                 5.63  11 Jan 22 2020               6.51                             9   Home meds:  - norvasc 10/ lipitor 40  - norco prn/ flomax 0.4 qd  - prn's/ vitamins/ supplements  UA 5/22 - negative, prot 30   Assessment/ Plan: 1. ESRD - Presumed due to multiple myeloma.  Pt wants to proceed with dialysis (in the past was not sure). He has uremic symptoms. [place] placed IR 5/24. L AVF placed 5/26, appreciate VVS.  CLIP to [facility] TTS.  We will complete a 3rd HD today here.    2. Multiple myeloma - f/b ONC - Dr. Irene Limbo has seen during admission, appreciate thoughtful note. If RCC untreated not likely going to proceed with systemic chemo for MM. 3. L renal mass - seen by urology in Oct 2020, suspected RCC.  In past pt has declined therapy - pall care called to help  outline GOC.  4. BP/ vol - cont norvasc, mild LE edema on exam otherwise euvolemic.  5. BPH on flomax 6. Anemia ckd +/- malignancy - Hb 8.2 , tsat ok at 40%,  Hold on ESA in light of likely RCC until clarify further.   Plan d/c today - ok per renal perspective.    Jannifer Hick MD [facility] Pager [phone]   Recent Labs  Lab 01/24/20 [phone] 01/25/20 2637 01/26/20 0348 01/27/20 0855  K 4.1   < > 4.1 4.0  BUN 95*   < > 34* 56*  CREATININE 6.18*   < > 3.57* 6.19*  CALCIUM 9.4   < > 8.9 9.5  PHOS  --   --   --  4.0  HGB 8.3*  --   --  7.4*   < > = values in this interval not displayed.   Inpatient medications: . amLODipine  10 mg Oral Daily  . atorvastatin  40 mg Oral Daily  . Chlorhexidine Gluconate Cloth  6 each Topical Q0600  . docusate sodium  100 mg Oral Daily  . feeding supplement (NEPRO CARB STEADY)  237 mL Oral BID BM  . heparin  5,000 Units Subcutaneous Q8H  . metoprolol tartrate  12.5 mg Oral BID  . multivitamin  1 tablet Oral QHS  . oxyCODONE      . tamsulosin  0.4 mg Oral Daily    ondansetron **OR** ondansetron (ZOFRAN) IV, oxyCODONE

## 2020-01-27 NOTE — Telephone Encounter (Signed)
Daughter calling to confirm which home health service was to come out. Information given to her [facility], as well as their office number.

## 2020-01-27 NOTE — Progress Notes (Addendum)
[facility] [place]  Subjective  - Doing well without much pain.   Objective (!) 143/79 73 98.7 F (37.1 C) (Oral) 16 96%  Intake/Output Summary (Last 24 hours) at 01/27/2020 0725 Last data filed at 01/26/2020 1056 Gross per 24 hour  Intake 15 ml  Output 25 ml  Net -10 ml    Left UE incision healing well, palpable thrill in fistula. Palpable radial pulse, grip 5/5, and sensation intact. Lungs non labored breathing  Assessment/Planning: POD # 1 Left arm first stage basilic vein fistula creation   F/U in our office in 5-6 weeks for planning of second stage procedure. Stable from a vascular point of view. IR placed TDC  Roxy Horseman 01/27/2020 7:25 AM --  Laboratory Lab Results: No results for input(s): WBC, HGB, HCT, PLT in the last 72 hours. BMET Recent Labs    01/25/20 0637 01/26/20 0348  NA 139 140  K 3.5 4.1  CL 104 99  CO2 25 29  GLUCOSE 167* 107*  BUN 55* 34*  CREATININE 4.08* 3.57*  CALCIUM 9.1 8.9    COAG Lab Results  Component Value Date   INR 1.0 12/24/2019   INR 0.9 06/28/2019   INR 0.8 06/04/2019   No results found for: PTT  I have independently interviewed and examined patient and agree with PA assessment and plan above.   Jodilyn Giese C. Donzetta Matters, MD [facility] [place] Office: [phone] Pager: [phone]

## 2020-01-27 NOTE — Progress Notes (Signed)
OP HD clinic Medical Director requesting documentation of patient's permanent access. Navigator faxed OR note to clinic.  Alphonzo Cruise, [place] Renal Navigator [phone]

## 2020-01-27 NOTE — Discharge Summary (Signed)
Physician Discharge Summary  [name] [name]:[id number] DOB: 06-17-1946 DOA: 01/22/2020  PCP: Nolene Ebbs, MD  Admit date: 01/22/2020 Discharge date: 01/27/2020  Admitted From: Home Disposition: Home  Recommendations for Outpatient Follow-up:  1. Follow up with PCP in 1-2 weeks 2. Follow-up with vascular surgery in 5-6 weeks for planning of the second stage procedure, Dr. Donzetta Matters 3. Follow-up with urology, Dr. Louis Meckel in 1-2 weeks in regards to renal mass 4. Follow-up with hematology/oncology, Dr. Irene Limbo for further discussion of treatment of multiple myeloma 5. Follow-up with cardiology, Dr. Percival Spanish for follow-up regarding diastolic heart failure and aortic aneurysm 6. Please obtain BMP/CBC in one week   Home Health: PT Equipment/Devices: Tunneled HD catheter  Discharge Condition: Stable CODE STATUS: Full code Diet recommendation: Renal diet, low-salt  History of present illness:  [name] [name] is a 74 year old man with a history of multiple myeloma, renal mass, chronic diastolic congestive heart failure, essential hypertension, anemia who presented to the ER at [facility] for progressive weakness.  Patient's creatinine was noted to be 6.7 which is increased from 5.6 to few weeks prior.  Due to worsening renal function with associated poor appetite and uremic symptoms, patient was transferred to [facility] for further evaluation and treatment.  In the month of October 2020 patient was admitted for acute renal failure in the setting of multiple myeloma had undergone 3 plasmapheresis treatment at the time. Patient states since then patient had not followed up with oncologist at [facility] and had proceeded to follow-up with [name] oncologist again with nephrology follow-up.   Of note, pt did not desire further treatment for myeloma as of 11/20, however, now agreeable to full treatment, including plasmapheresis if indicated  Hospital course:  Acute on chronic  renal failure now progressed to ESRD Patient presenting as a transfer from [facility] for progressive weakness associated with further deterioration of his renal function. Etiology likely secondary to his multiple myeloma and likely renal cell carcinoma.  Underwent IR placement of tunneled HD catheter on 01/24/2020 and underwent left AVF creation by vascular surgery, Dr. Donzetta Matters on 01/26/2020. Continue HD per nephrology; plan outpatient TTS schedule at [facility]  Multiple myeloma Follows with medical oncology outpatient, Dr. Irene Limbo.  Medical oncology was consulted and was seen by Dr. Irene Limbo on 5/24.  Given his poor performance status and noncompliance he is a poor candidate for systemic myeloma treatment but if he is agreeable/tolerant with ongoing HD and has a clear plan to address his renal mass, oncology would consider restarting systemic myeloma treatment as outpatient if he read demonstrates compliance with treatment guidelines.  Left renal mass Highly suspicious for renal cell carcinoma, in the past has declined therapy.  CT abdomen notable for 8.7 cm left renal mass.  Patient has follow-up with [facility] in [facility] past, Dr. Louis Meckel.  Case was discussed during this hospitalization with urology, Dr. Jeffie Pollock; who recommended close outpatient follow-up following discharge.  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 twice daily  Essential hypertension Continue amlodipine 10 mg p.o. daily metoprolol tartrate 12.5 mg p.o. BID  Ascending aortic aneurysm Noted mild dilation aortic root and of the ascending order measuring 48 mm. Continue follow-up with cardiology, Dr. Percival Spanish  Chronic diastolic congestive heart failure, compensated TTE 07/28/2019, EF 76-14%, grade 1 diastolic dysfunction, moderately increased LVH, moderately dilated LV, severely dilated LA/RA,  moderate MR, mild TR. Continue beta-blocker. Outpatient  follow-up with cardiology  Discharge Diagnoses:  Principal Problem:   ARF (acute renal failure) (HCC) Active Problems:   Essential (primary) hypertension   Paroxysmal atrial fibrillation (HCC)   Light chain myeloma (HCC)   CAD (coronary artery disease)   Chronic diastolic CHF (congestive heart failure) (HCC)   Anemia   Ascending aortic aneurysm (HCC)   Goals of care, counseling/discussion   Palliative care by specialist   DNR (do not resuscitate) discussion   ESRD on hemodialysis [facility])    Discharge Instructions  Discharge Instructions    Call MD for:  difficulty breathing, headache or visual disturbances   Complete by: As directed    Call MD for:  extreme fatigue   Complete by: As directed    Call MD for:  persistant dizziness or light-headedness   Complete by: As directed    Call MD for:  persistant nausea and vomiting   Complete by: As directed    Call MD for:  redness, tenderness, or signs of infection (pain, swelling, redness, odor or green/yellow discharge around incision site)   Complete by: As directed    Call MD for:  severe uncontrolled pain   Complete by: As directed    Call MD for:  temperature >100.4   Complete by: As directed    Diet - low sodium heart healthy   Complete by: As directed    Increase activity slowly   Complete by: As directed      Allergies as of 01/27/2020      Reactions   Amoxicillin Other (See Comments)   Tolerates Unasyn. Can't move Has patient had a PCN reaction causing immediate rash, facial/tongue/throat swelling, SOB or lightheadedness with hypotension: No Has patient had a PCN reaction causing severe rash involving mucus membranes or skin necrosis: No Has patient had a PCN reaction that required hospitalization No Has patient had a PCN reaction occurring within the last 10 years: No If all of the above answers are "NO", then may proceed with Cephalosporin use.   Penicillins  Other (See Comments)   Tolerates Unasyn. Can't move/dizziness Has patient had a PCN reaction causing immediate rash, facial/tongue/throat swelling, SOB or lightheadedness with hypotension: No Has patient had a PCN reaction causing severe rash involving mucus membranes or skin necrosis: No Has patient had a PCN reaction that required hospitalization No Has patient had a PCN reaction occurring within the last 10 years: No If all of the above answers are "NO", then may proceed with Cephalosporin use.      Medication List    TAKE these medications   amLODipine 10 MG tablet Commonly known as: NORVASC Take 1 tablet (10 mg total) by mouth daily.   atorvastatin 40 MG tablet Commonly known as: LIPITOR Take 40 mg by mouth daily.   cetirizine 10 MG tablet Commonly known as: ZYRTEC TAKE 1 TABLET (10 MG TOTAL) BY MOUTH DAILY AS NEEDED (SEASONAL ALLERGIES). What changed: reasons to take this   FISH OIL PO Take 1 capsule by mouth daily.   HYDROcodone-acetaminophen 5-325 MG tablet Commonly known as: NORCO/VICODIN Take 1-2 tablets by mouth every 6 (six) hours as needed (gout pain).   metoprolol tartrate 25 MG tablet Commonly known as: LOPRESSOR Take 0.5 tablets (12.5 mg total) by mouth 2 (two) times daily.   multivitamin Tabs tablet Take 1 tablet by mouth at bedtime.   OneTouch Ultra test strip Generic drug: glucose blood Check FSBS twice a day. Dx: E11.21, N18.32   tamsulosin 0.4 MG Caps capsule Commonly known  as: FLOMAX Take 1 capsule (0.4 mg total) by mouth daily.   VITAMIN B-12 PO Take 1 tablet by mouth daily.   VITAMIN D3 PO Take 1 tablet by mouth daily.      Follow-up Information    Waynetta Sandy, MD.   Specialties: Vascular Surgery, Cardiology Why: The office will call the patient with an appointment(sent) Contact information: [place] [address] [phone]        Ardis Hughs, MD. Schedule an appointment as soon as possible  for a visit in 1 week(s).   Specialty: Urology Contact information: [place] [address] [phone]        Brunetta Genera, MD. Schedule an appointment as soon as possible for a visit in 1 week(s).   Specialties: Hematology, Oncology Contact information: [address] [place] [address] [phone]          Allergies  Allergen Reactions  . Amoxicillin Other (See Comments)    Tolerates Unasyn. Can't move Has patient had a PCN reaction causing immediate rash, facial/tongue/throat swelling, SOB or lightheadedness with hypotension: No Has patient had a PCN reaction causing severe rash involving mucus membranes or skin necrosis: No Has patient had a PCN reaction that required hospitalization No Has patient had a PCN reaction occurring within the last 10 years: No If all of the above answers are "NO", then may proceed with Cephalosporin use.   [name] Penicillins Other (See Comments)    Tolerates Unasyn. Can't move/dizziness Has patient had a PCN reaction causing immediate rash, facial/tongue/throat swelling, SOB or lightheadedness with hypotension: No Has patient had a PCN reaction causing severe rash involving mucus membranes or skin necrosis: No Has patient had a PCN reaction that required hospitalization No Has patient had a PCN reaction occurring within the last 10 years: No If all of the above answers are "NO", then may proceed with Cephalosporin use.      Consultations:  Nephrology  Oncology  Discussed case with Urology  Vascular Surgery  Procedures/Studies: IR Fluoro Guide CV Line Right  Result Date: 01/24/2020 INDICATION: 74 year old male referred for tunneled hemodialysis catheter EXAM: TUNNELED CENTRAL VENOUS HEMODIALYSIS CATHETER PLACEMENT WITH ULTRASOUND AND FLUOROSCOPIC GUIDANCE MEDICATIONS: 1 g vancomycin. The antibiotic was given in an appropriate time interval prior to skin puncture. ANESTHESIA/SEDATION: Moderate  (conscious) sedation was employed during this procedure. A total of Versed 1.0 mg and Fentanyl 25 mcg was administered intravenously. Moderate Sedation Time: 14 minutes. The patient's level of consciousness and vital signs were monitored continuously by radiology nursing throughout the procedure under my direct supervision. FLUOROSCOPY TIME:  Fluoroscopy Time: 0 minutes 48 seconds (4 mGy). COMPLICATIONS: None PROCEDURE: Informed written consent was obtained from the patient after a discussion of the risks, benefits, and alternatives to treatment. Questions regarding the procedure were encouraged and answered. The right neck and chest were prepped with chlorhexidine in a sterile fashion, and a sterile drape was applied covering the operative field. Maximum barrier sterile technique with sterile gowns and gloves were used for the procedure. A timeout was performed prior to the initiation of the procedure. Ultrasound survey was performed. Micropuncture kit was utilized to access the right internal jugular vein under direct, real-time ultrasound guidance after the overlying soft tissues were anesthetized with 1% lidocaine with epinephrine. Stab incision was made with 11 blade scalpel. Microwire was passed centrally. The microwire was then marked to measure appropriate internal catheter length. External tunneled length was estimated. A total tip to cuff  length of 23 cm was selected. 035 guidewire was advanced to the level of the IVC. Skin and subcutaneous tissues of chest wall below the clavicle were generously infiltrated with 1% lidocaine for local anesthesia. A small stab incision was made with 11 blade scalpel. The selected hemodialysis catheter was tunneled in a retrograde fashion from the anterior chest wall to the venotomy incision. Serial dilation was performed and then a peel-away sheath was placed. The catheter was then placed through the peel-away sheath with tips ultimately positioned within the superior  aspect of the right atrium. Final catheter positioning was confirmed and documented with a spot radiographic image. The catheter aspirates and flushes normally. The catheter was flushed with appropriate volume heparin dwells. The catheter exit site was secured with a 0-Prolene retention suture. Gel-Foam slurry was infused into the soft tissue tract. The venotomy incision was closed Derma bond and sterile dressing. Dressings were applied at the chest wall. Patient tolerated the procedure well and remained hemodynamically stable throughout. No complications were encountered and no significant blood loss encountered. IMPRESSION: Status post right IJ tunneled hemodialysis catheter. Signed, Dulcy Fanny. Dellia Nims, RPVI [facility] Rad[facility] Electronically Signed   By: Corrie Mckusick D.O.   On: 01/24/2020 17:27   IR US Guide Vasc Access Right  Result Date: 01/24/2020 INDICATION: 74 year old male referred for tunneled hemodialysis catheter EXAM: TUNNELED CENTRAL VENOUS HEMODIALYSIS CATHETER PLACEMENT WITH ULTRASOUND AND FLUOROSCOPIC GUIDANCE MEDICATIONS: 1 g vancomycin. The antibiotic was given in an appropriate time interval prior to skin puncture. ANESTHESIA/SEDATION: Moderate (conscious) sedation was employed during this procedure. A total of Versed 1.0 mg and Fentanyl 25 mcg was administered intravenously. Moderate Sedation Time: 14 minutes. The patient's level of consciousness and vital signs were monitored continuously by radiology nursing throughout the procedure under my direct supervision. FLUOROSCOPY TIME:  Fluoroscopy Time: 0 minutes 48 seconds (4 mGy). COMPLICATIONS: None PROCEDURE: Informed written consent was obtained from the patient after a discussion of the risks, benefits, and alternatives to treatment. Questions regarding the procedure were encouraged and answered. The right neck and chest were prepped with chlorhexidine in a sterile fashion, and a sterile  drape was applied covering the operative field. Maximum barrier sterile technique with sterile gowns and gloves were used for the procedure. A timeout was performed prior to the initiation of the procedure. Ultrasound survey was performed. Micropuncture kit was utilized to access the right internal jugular vein under direct, real-time ultrasound guidance after the overlying soft tissues were anesthetized with 1% lidocaine with epinephrine. Stab incision was made with 11 blade scalpel. Microwire was passed centrally. The microwire was then marked to measure appropriate internal catheter length. External tunneled length was estimated. A total tip to cuff length of 23 cm was selected. 035 guidewire was advanced to the level of the IVC. Skin and subcutaneous tissues of chest wall below the clavicle were generously infiltrated with 1% lidocaine for local anesthesia. A small stab incision was made with 11 blade scalpel. The selected hemodialysis catheter was tunneled in a retrograde fashion from the anterior chest wall to the venotomy incision. Serial dilation was performed and then a peel-away sheath was placed. The catheter was then placed through the peel-away sheath with tips ultimately positioned within the superior aspect of the right atrium. Final catheter positioning was confirmed and documented with a spot radiographic image. The catheter aspirates and flushes normally. The catheter was flushed with appropriate volume heparin dwells. The catheter exit site was secured with a 0-Prolene  retention suture. Gel-Foam slurry was infused into the soft tissue tract. The venotomy incision was closed Derma bond and sterile dressing. Dressings were applied at the chest wall. Patient tolerated the procedure well and remained hemodynamically stable throughout. No complications were encountered and no significant blood loss encountered. IMPRESSION: Status post right IJ tunneled hemodialysis catheter. Signed, Dulcy Fanny. Dellia Nims, RPVI [facility] Interventional Radiology Specialists [facility] Radiology Electronically Signed   By: Corrie Mckusick D.O.   On: 01/24/2020 17:27   DG CHEST PORT 1 VIEW  Result Date: 01/22/2020 CLINICAL DATA:  Shortness of breath EXAM: PORTABLE CHEST 1 VIEW COMPARISON:  11/30/2018 FINDINGS: Cardiomegaly and aortic tortuosity. There is no edema, consolidation, effusion, or pneumothorax. Hazy density at the right apex is chronic and attributed to ectatic vessels based on 2018 chest CT. IMPRESSION: Cardiomegaly without failure. Electronically Signed   By: Monte Fantasia M.D.   On: 01/22/2020 07:06   CT RENAL STONE STUDY  Result Date: 01/22/2020 CLINICAL DATA:  74 year old male with a history of flank pain EXAM: CT ABDOMEN AND PELVIS WITHOUT CONTRAST TECHNIQUE: Multidetector CT imaging of the abdomen and pelvis was performed following the standard protocol without IV contrast. COMPARISON:  Ultrasound 06/26/2019, CT 06/01/2019 FINDINGS: Lower chest: No acute finding of the lower chest. Hepatobiliary: Similar appearance of liver with small right-sided hypodense focus measuring 11 mm and additional small subcentimeter hypodense foci within the right liver. Gallbladder decompressed. Pancreas: Unremarkable Spleen: Unremarkable Adrenals/Urinary Tract: - Right adrenal gland:  Unremarkable - Left adrenal gland: Unremarkable. - Right kidney: Similar appearance of the right kidney with multiple rounded lesions some of which are hyperdense and some of which are low-density. Complex lesion at the inferior renal cortex on the right estimated 4.7 cm x 4.5 cm, similar to the comparison. No evidence of hydronephrosis or nephrolithiasis. Unremarkable course of the right ureter. - Left Kidney: No evidence of left-sided hydronephrosis. Unremarkable course of the left ureter. Redemonstration of multiple low-density and high density lesions of left kidney. 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 The axial dimensions on the prior CT measured 7.2 cm x 6.2 cm while the axial dimensions on the current CT are estimated 7.5 cm x 8.7 cm - Urinary Bladder: Urinary bladder is relatively unremarkable partially distended. Impression on the bladder base secondary to the median lobe of the prostate. Stomach/Bowel: - Stomach: Unremarkable. - Small bowel: Unremarkable - Appendix: Appendix is not visualized, however, no inflammatory changes are present adjacent to the cecum to indicate an appendicitis. - Colon: Surgical changes in the region of the cecum. Redemonstration of ventral hernia containing segment of transverse colon. No evidence of obstruction. Colonic diverticula. No inflammatory changes. No significant stool burden. Vascular/Lymphatic: Atherosclerotic calcifications of the abdominal aorta. Similar diameter of the infrarenal abdominal aorta with similar course caliber and contour. Greatest diameter measures 28 mm, similar to the comparison. Atherosclerotic changes of the bilateral iliac arteries. No lymphadenopathy. Reproductive: Enlarged prostate with the diameter on axial images measuring 6.7 cm x 8.2 cm. Impression on the bladder base from the median lobe. Other: Small umbilical hernia which again appears to have a knuckle of small bowel entering the hernia sac. No evidence of associated obstruction. Supraumbilical ventral wall hernia again noted containing short segment of transverse colon. No significant inflammatory changes or evidence of obstruction. Bilateral fat containing inguinal hernia with trace fluid on the left.  Musculoskeletal: No acute displaced fracture. Multilevel degenerative changes of the lumbar spine with multiple Schmorl's nodes. Vacuum disc phenomenon present at all levels except for L4-L5. No bony canal narrowing with posterior disc bulges at all levels. Degenerative changes of  the bilateral hips. IMPRESSION: CT is negative for nephrolithiasis or hydronephrosis. No acute CT finding identified that would account for flank pain. 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 Urology referral is indicated if not previously completed. Redemonstration of additional bilateral renal lesions, incompletely characterized on the current CT. This includes a complex lesion at the inferior right kidney which may represent a partially cystic additional renal cell carcinoma. Again, urology referral is indicated. Unchanged appearance of a supraumbilical ventral hernia containing transverse colon. Unchanged appearance of umbilical hernia which contains short segment of small bowel, without obstruction. Prostatomegaly. Aortic Atherosclerosis (ICD10-I70.0). Unchanged diameter of the infrarenal abdominal aorta measuring 2.8 cm. Ectatic abdominal aorta at risk for aneurysm development. Recommend followup by ultrasound in 5 years. This recommendation follows ACR consensus guidelines: White Paper of the ACR Incidental Findings Committee II on Vascular Findings. J Am Coll Radiol 2013; 10:789-794. Additional ancillary findings as above. Electronically Signed   By: Corrie Mckusick D.O.   On: 01/22/2020 12:38     Subjective: Seen and examined at bedside, resting comfortably.  Waiting to go to dialysis this morning.  Ready for discharge home later this afternoon.  Stands that he needs to go sign paperwork tomorrow at dialysis unit and to begin outpatient dialysis on Saturday.  No other questions or concerns at this time.  Denies headache, no fever/chills/night sweats, no nausea/vomiting/diarrhea, no chest pain, no palpitations, no shortness of breath, no abdominal pain.  No acute events overnight per nursing staff.  Discharge Exam: Vitals:   01/27/20 0915 01/27/20 0930  BP: (!)  156/88 (!) 156/88  Pulse: 73 73  Resp:    Temp:    SpO2:     Vitals:   01/27/20 0840 01/27/20 0845 01/27/20 0915 01/27/20 0930  BP: 134/81 137/79 (!) 156/88 (!) 156/88  Pulse: 71 73 73 73  Resp: 16 14    Temp:      TempSrc:      SpO2:      Weight:      Height:        General: Pt is alert, awake, not in acute distress Cardiovascular: RRR, S1/S2 +, no rubs, no gallops, tunneled HD catheter noted Respiratory: CTA bilaterally, no wheezing, no rhonchi Abdominal: Soft, NT, ND, bowel sounds + Extremities: no edema, no cyanosis, left upper extremity AV fistula site noted, palpable thrill    The results of significant diagnostics from this hospitalization (including imaging, microbiology, ancillary and laboratory) are listed below for reference.     Microbiology: Recent Results (from the past 240 hour(s))  SARS Coronavirus 2 by RT PCR (hospital order, performed in [facility] hospital lab) Nasopharyngeal Nasopharyngeal Swab     Status: None   Collection Time: 01/22/20  6:01 AM   Specimen: Nasopharyngeal Swab  Result Value Ref Range Status   SARS Coronavirus 2 NEGATIVE NEGATIVE Final    Comment: (NOTE) SARS-CoV-2 target nucleic acids are NOT DETECTED. The SARS-CoV-2 RNA is generally detectable in upper and lower respiratory specimens during the acute phase of infection. The lowest concentration of SARS-CoV-2 viral copies this assay can detect is 250 copies / mL. A negative result does  not preclude SARS-CoV-2 infection and should not be used as the sole basis for treatment or other patient management decisions.  A negative result may occur with improper specimen collection / handling, submission of specimen other than nasopharyngeal swab, presence of viral mutation(s) within the areas targeted by this assay, and inadequate number of viral copies (<250 copies / mL). A negative result must be combined with clinical observations, patient history, and epidemiological  information. Fact Sheet for Patients:   StrictlyIdeas.no Fact Sheet for Healthcare Providers: BankingDealers.co.za This test is not yet approved or cleared  by the [place] FDA and has been authorized for detection and/or diagnosis of SARS-CoV-2 by FDA under an Emergency Use Authorization (EUA).  This EUA will remain in effect (meaning this test can be used) for the duration of the COVID-19 declaration under Section 564(b)(1) of the Act, 21 U.S.C. section 360bbb-3(b)(1), unless the authorization is terminated or revoked sooner. Performed at [facility], [place] [address] [place], [place] [address]      Labs: BNP (last 3 results) No results for input(s): BNP in the last 8760 hours. Basic Metabolic Panel: Recent Labs  Lab 01/22/20 0556 01/23/20 0322 01/24/20 0211 01/25/20 0637 01/26/20 0348  NA 139 137 138 139 140  K 3.3* 3.1* 4.1 3.5 4.1  CL 103 106 101 104 99  CO2 22 20* _0 GLUCOSE 86 88 109* 167* 107*  BUN 90* 90* 95* 55* 34*  CREATININE 6.51* 6.31* 6.18* 4.08* 3.57*  CALCIUM 9.2 9.0 9.4 9.1 8.9  MG  --  1.9  --   --   --    Liver Function Tests: Recent Labs  Lab 01/22/20 0556 01/23/20 0322  AST 16 12*  ALT 14 13  ALKPHOS 74 61  BILITOT 1.2 0.4  PROT 6.7 6.4*  ALBUMIN 3.4* 3.3*   No results for input(s): LIPASE, AMYLASE in the last 168 hours. No results for input(s): AMMONIA in the last 168 hours. CBC: Recent Labs  Lab 01/22/20 0556 01/23/20 0322 01/24/20 0211 01/27/20 0855  WBC 5.1 5.3 5.8 5.6  NEUTROABS 3.4  --   --   --   HGB 8.2* 8.0* 8.3* 7.4*  HCT 24.9* 23.9* 24.9* 22.8*  MCV 94.7 93.4 94.3 98.3  PLT 213 215 225 179   Cardiac Enzymes: Recent Labs  Lab 01/22/20 0556  CKTOTAL 382   BNP: Invalid input(s): POCBNP CBG: Recent Labs  Lab 01/24/20 1426 01/26/20 0251 01/26/20 1027  GLUCAP 73 120* 88   D-Dimer No results for input(s): DDIMER in the last 72 hours. Hgb A1c No  results for input(s): HGBA1C in the last 72 hours. Lipid Profile No results for input(s): CHOL, HDL, LDLCALC, TRIG, CHOLHDL, LDLDIRECT in the last 72 hours. Thyroid function studies No results for input(s): TSH, T4TOTAL, T3FREE, THYROIDAB in the last 72 hours.  Invalid input(s): FREET3 Anemia work up No results for input(s): VITAMINB12, FOLATE, FERRITIN, TIBC, IRON, RETICCTPCT in the last 72 hours. Urinalysis    Component Value Date/Time   COLORURINE STRAW (A) 01/22/2020 0505   APPEARANCEUR CLEAR 01/22/2020 0505   LABSPEC 1.010 01/22/2020 0505   PHURINE 6.0 01/22/2020 0505   GLUCOSEU NEGATIVE 01/22/2020 0505   HGBUR MODERATE (A) 01/22/2020 0505   BILIRUBINUR NEGATIVE 01/22/2020 0505   KETONESUR NEGATIVE 01/22/2020 0505   PROTEINUR 30 (A) 01/22/2020 0505   NITRITE NEGATIVE 01/22/2020 0505   LEUKOCYTESUR NEGATIVE 01/22/2020 0505   Sepsis Labs Invalid input(s): PROCALCITONIN,  WBC,  LACTICIDVEN Microbiology Recent Results (  from the past 240 hour(s))  SARS Coronavirus 2 by RT PCR (hospital order, performed in [facility] hospital lab) Nasopharyngeal Nasopharyngeal Swab     Status: None   Collection Time: 01/22/20  6:01 AM   Specimen: Nasopharyngeal Swab  Result Value Ref Range Status   SARS Coronavirus 2 NEGATIVE NEGATIVE Final    Comment: (NOTE) SARS-CoV-2 target nucleic acids are NOT DETECTED. The SARS-CoV-2 RNA is generally detectable in upper and lower respiratory specimens during the acute phase of infection. The lowest concentration of SARS-CoV-2 viral copies this assay can detect is 250 copies / mL. A negative result does not preclude SARS-CoV-2 infection and should not be used as the sole basis for treatment or other patient management decisions.  A negative result may occur with improper specimen collection / handling, submission of specimen other than nasopharyngeal swab, presence of viral mutation(s) within the areas targeted by this assay, and inadequate number of  viral copies (<250 copies / mL). A negative result must be combined with clinical observations, patient history, and epidemiological information. Fact Sheet for Patients:   StrictlyIdeas.no Fact Sheet for Healthcare Providers: BankingDealers.co.za This test is not yet approved or cleared  by the [place] FDA and has been authorized for detection and/or diagnosis of SARS-CoV-2 by FDA under an Emergency Use Authorization (EUA).  This EUA will remain in effect (meaning this test can be used) for the duration of the COVID-19 declaration under Section 564(b)(1) of the Act, 21 U.S.C. section 360bbb-3(b)(1), unless the authorization is terminated or revoked sooner. Performed at [facility], [place] [address] [place], [place] [address]      Time coordinating discharge: Over 30 minutes  SIGNED:   Toben Acuna J [place] DO  [facility] 01/27/2020, 10:06 AM

## 2020-01-27 NOTE — Progress Notes (Signed)
Renal Navigator met with patient at HD bedside to see how he is doing and discuss plan to start in the clinic on Saturday. He is still disappointed that he will have to do treatments on Saturdays, but Navigator said again that he can ask to be put on a waiting list at the clinic to be switched to a MWF seat when one becomes available. He also continues to tell staff (HD RNs this morning) that his wife received excellent care at [facility] and he would really prefer to treat there. He is understanding that there are no seats available at that clinic, which is quite a distance from his home. Navigator has told him that he can discuss a request for transfer if he would really like to change centers in the future, but it is not an option at this time. Patient is pleasant and understanding. He states he feels good about discharge home after HD today, knows he needs to go to the clinic tomorrow to sign paperwork and is agreeable to starting in the clinic on Saturday. Clinic aware and Navigator has informed Renal PA/David Z to send orders to OP HD clinic/[facility].  Alphonzo Cruise, [place] Renal Navigator [phone]

## 2020-01-27 NOTE — Progress Notes (Signed)
PT Cancellation Note  Patient Details Name: JIREH ELMORE MRN: [id number] DOB: 1946/03/22   Cancelled Treatment:    Reason Eval/Treat Not Completed: (P) Patient at procedure or test/unavailable(Pt off unit for HD will f/u per POC.)   Kirsti Mcalpine Eli Hose 01/27/2020, 10:04 AM  Erasmo Leventhal , PTA Acute Rehabilitation Services Pager [phone] Office [phone]

## 2020-01-27 NOTE — Progress Notes (Signed)
Discharge Instructions given , verbalized understanding. Taken down via W/C

## 2020-01-27 NOTE — Telephone Encounter (Signed)
Scheduled appts per 5/27 sch msg. Pt confirmed appt date and time.

## 2020-01-27 NOTE — Plan of Care (Signed)
  Problem: Education: Goal: Knowledge of General Education information will improve Description: Including pain rating scale, medication(s)/side effects and non-pharmacologic comfort measures 01/27/2020 1350 by Melina Schools, RN Outcome: Adequate for Discharge 01/27/2020 0803 by Melina Schools, RN Outcome: Progressing   Problem: Health Behavior/Discharge Planning: Goal: Ability to manage health-related needs will improve Outcome: Adequate for Discharge   Problem: Clinical Measurements: Goal: Ability to maintain clinical measurements within normal limits will improve Outcome: Adequate for Discharge Goal: Will remain free from infection Outcome: Adequate for Discharge Goal: Diagnostic test results will improve Outcome: Adequate for Discharge Goal: Respiratory complications will improve Outcome: Adequate for Discharge Goal: Cardiovascular complication will be avoided Outcome: Adequate for Discharge   Problem: Activity: Goal: Risk for activity intolerance will decrease Outcome: Adequate for Discharge   Problem: Nutrition: Goal: Adequate nutrition will be maintained Outcome: Adequate for Discharge   Problem: Coping: Goal: Level of anxiety will decrease Outcome: Adequate for Discharge   Problem: Elimination: Goal: Will not experience complications related to bowel motility Outcome: Adequate for Discharge Goal: Will not experience complications related to urinary retention Outcome: Adequate for Discharge   Problem: Pain Managment: Goal: General experience of comfort will improve 01/27/2020 1350 by Melina Schools, RN Outcome: Adequate for Discharge 01/27/2020 0803 by Melina Schools, RN Outcome: Progressing   Problem: Safety: Goal: Ability to remain free from injury will improve 01/27/2020 1350 by Melina Schools, RN Outcome: Adequate for Discharge 01/27/2020 0803 by Melina Schools, RN Outcome: Progressing   Problem: Skin Integrity: Goal: Risk for  impaired skin integrity will decrease Outcome: Adequate for Discharge   Problem: Health Behavior/Discharge Planning: Goal: Ability to manage health-related needs will improve Outcome: Adequate for Discharge   Problem: Clinical Measurements: Goal: Ability to maintain clinical measurements within normal limits will improve Outcome: Adequate for Discharge   Problem: Clinical Measurements: Goal: Will remain free from infection Outcome: Adequate for Discharge   Problem: Clinical Measurements: Goal: Diagnostic test results will improve Outcome: Adequate for Discharge   Problem: Clinical Measurements: Goal: Respiratory complications will improve Outcome: Adequate for Discharge   Problem: Clinical Measurements: Goal: Cardiovascular complication will be avoided Outcome: Adequate for Discharge   Problem: Activity: Goal: Risk for activity intolerance will decrease Outcome: Adequate for Discharge   Problem: Nutrition: Goal: Adequate nutrition will be maintained Outcome: Adequate for Discharge   Problem: Coping: Goal: Level of anxiety will decrease Outcome: Adequate for Discharge   Problem: Elimination: Goal: Will not experience complications related to bowel motility Outcome: Adequate for Discharge   Problem: Elimination: Goal: Will not experience complications related to urinary retention Outcome: Adequate for Discharge   Problem: Pain Managment: Goal: General experience of comfort will improve 01/27/2020 1350 by Melina Schools, RN Outcome: Adequate for Discharge 01/27/2020 0803 by Melina Schools, RN Outcome: Progressing   Problem: Safety: Goal: Ability to remain free from injury will improve 01/27/2020 1350 by Melina Schools, RN Outcome: Adequate for Discharge 01/27/2020 0803 by Melina Schools, RN Outcome: Progressing   Problem: Skin Integrity: Goal: Risk for impaired skin integrity will decrease Outcome: Adequate for Discharge

## 2020-01-27 NOTE — Plan of Care (Signed)
  Problem: Education: Goal: Knowledge of General Education information will improve Description: Including pain rating scale, medication(s)/side effects and non-pharmacologic comfort measures Outcome: Progressing   Problem: Pain Managment: Goal: General experience of comfort will improve Outcome: Progressing   Problem: Safety: Goal: Ability to remain free from injury will improve Outcome: Progressing   

## 2020-01-27 NOTE — TOC Transition Note (Addendum)
Transition of Care [name]) - CM/SW Discharge Note   Patient Details  Name: [name] [name] MRN: [id number] Date of Birth: 1946-03-05  Transition of Care [name]) CM/SW Contact:  [name], RN Phone Number: [phone] 01/27/2020, 2:41 PM   Clinical Narrative:    Admitted with worsening renal function/ renal failure, weakness. Hx of multiple myeloma, renal mass, chronic diastolic congestive heart failure, essential hypertension, anemia.  From home alone . States PTA independent with ADL's, no DME usage. Son works @ [employer], supportive. [name])     [phone]      Hospital course new HD pt.  5/26  s/p  Left upper arm ARTERIOVENOUS (AV) FISTULA. Clipped: [facility]  OP HD treatment on a TTS schedule with a seat time of 7:20am. Pt states without transportation issues/conerns. Pt will transition to home. Agreeable to home health services , pt with high readmission score. Choice given and [name] selected to provide [name] services. Referral made by NCM ...awaiting acceptance.  Outpatient palliative referral made with [name], consent given by pt.  Pt with Rx med concerns or affordability. No DME needs. States has transportation to home. Pt with f/u appointments post hospitalization, refer to [facility].  TOC team will continue to monitor for [facility] acceptance...Marland KitchenMarland KitchenMarland Kitchen  Final next level of care: [name]: No Barriers Identified   Patient Goals and CMS Choice Patient states their goals for this hospitalization and ongoing recovery are:: to go home and take care of myself   Choice offered to / list presented to : Patient  Discharge Placement                       Discharge Plan and Services   Discharge Planning Services: CM Consult            DME Arranged: N/A DME Agency: NA       HH Arranged: PT, RN, Social Work [employer] Agency: [facility] [facility] [employer] Date [place]:  01/27/20 Time [place]: [place] Representative spoke with at [place]: [name] Interventions     Readmission Risk Interventions Readmission Risk Prevention Plan 06/29/2019  Transportation Screening Complete  Medication Review Press photographer) Complete  PCP or Specialist appointment within 3-5 days of discharge Complete  HRI or [place] Complete  SW Recovery Care/Counseling Consult Complete  [facility] Not Applicable  Some recent data might be hidden

## 2020-01-28 ENCOUNTER — Telehealth: Payer: Self-pay | Admitting: Nurse Practitioner

## 2020-01-28 NOTE — Telephone Encounter (Signed)
Transition of care contact from inpatient facility  Date of discharge: 01/27/2020 Date of contact: 01/28/2020 Method: Phone Spoke to: Patient  Patient contacted to discuss transition of care from recent inpatient hospitalization. Patient was admitted to [facility] from 01/22/2020-01/27/2020 with discharge diagnosis of Acute on chronic renal failure now progressed to ESRD, multiple myeloma, Left renal mass  Medication changes were reviewed.  Patient will follow up with his/her outpatient HD unit on: 01/29/2020 at [facility].

## 2020-02-04 ENCOUNTER — Encounter (HOSPITAL_COMMUNITY): Payer: Self-pay

## 2020-02-04 ENCOUNTER — Inpatient Hospital Stay (HOSPITAL_COMMUNITY)
Admission: EM | Admit: 2020-02-04 | Discharge: 2020-02-09 | DRG: 177 | Disposition: A | Discharge status: Home / Self Care | Payer: Medicare Other | Attending: Internal Medicine | Admitting: Internal Medicine

## 2020-02-04 ENCOUNTER — Observation Stay (HOSPITAL_COMMUNITY): Payer: Medicare Other

## 2020-02-04 DIAGNOSIS — Z87891 Personal history of nicotine dependence: Secondary | ICD-10-CM

## 2020-02-04 DIAGNOSIS — I252 Old myocardial infarction: Secondary | ICD-10-CM

## 2020-02-04 DIAGNOSIS — Z8249 Family history of ischemic heart disease and other diseases of the circulatory system: Secondary | ICD-10-CM

## 2020-02-04 DIAGNOSIS — Z79899 Other long term (current) drug therapy: Secondary | ICD-10-CM

## 2020-02-04 DIAGNOSIS — Z85038 Personal history of other malignant neoplasm of large intestine: Secondary | ICD-10-CM

## 2020-02-04 DIAGNOSIS — D649 Anemia, unspecified: Secondary | ICD-10-CM | POA: Diagnosis not present

## 2020-02-04 DIAGNOSIS — I132 Hypertensive heart and chronic kidney disease with heart failure and with stage 5 chronic kidney disease, or end stage renal disease: Secondary | ICD-10-CM | POA: Diagnosis present

## 2020-02-04 DIAGNOSIS — C9000 Multiple myeloma not having achieved remission: Secondary | ICD-10-CM | POA: Diagnosis present

## 2020-02-04 DIAGNOSIS — U071 COVID-19: Secondary | ICD-10-CM | POA: Diagnosis not present

## 2020-02-04 DIAGNOSIS — H0520 Unspecified exophthalmos: Secondary | ICD-10-CM | POA: Diagnosis present

## 2020-02-04 DIAGNOSIS — Z8546 Personal history of malignant neoplasm of prostate: Secondary | ICD-10-CM

## 2020-02-04 DIAGNOSIS — I10 Essential (primary) hypertension: Secondary | ICD-10-CM | POA: Diagnosis present

## 2020-02-04 DIAGNOSIS — Z8616 Personal history of COVID-19: Secondary | ICD-10-CM

## 2020-02-04 DIAGNOSIS — I480 Paroxysmal atrial fibrillation: Secondary | ICD-10-CM | POA: Diagnosis present

## 2020-02-04 DIAGNOSIS — D849 Immunodeficiency, unspecified: Secondary | ICD-10-CM | POA: Diagnosis present

## 2020-02-04 DIAGNOSIS — J440 Chronic obstructive pulmonary disease with acute lower respiratory infection: Secondary | ICD-10-CM | POA: Diagnosis present

## 2020-02-04 DIAGNOSIS — J1282 Pneumonia due to coronavirus disease 2019: Secondary | ICD-10-CM | POA: Diagnosis present

## 2020-02-04 DIAGNOSIS — E119 Type 2 diabetes mellitus without complications: Secondary | ICD-10-CM

## 2020-02-04 DIAGNOSIS — E785 Hyperlipidemia, unspecified: Secondary | ICD-10-CM | POA: Diagnosis present

## 2020-02-04 DIAGNOSIS — M79651 Pain in right thigh: Secondary | ICD-10-CM | POA: Diagnosis present

## 2020-02-04 DIAGNOSIS — Z9119 Patient's noncompliance with other medical treatment and regimen: Secondary | ICD-10-CM

## 2020-02-04 DIAGNOSIS — N186 End stage renal disease: Secondary | ICD-10-CM

## 2020-02-04 DIAGNOSIS — K439 Ventral hernia without obstruction or gangrene: Secondary | ICD-10-CM | POA: Diagnosis present

## 2020-02-04 DIAGNOSIS — Z992 Dependence on renal dialysis: Secondary | ICD-10-CM

## 2020-02-04 DIAGNOSIS — Z85528 Personal history of other malignant neoplasm of kidney: Secondary | ICD-10-CM

## 2020-02-04 DIAGNOSIS — Z880 Allergy status to penicillin: Secondary | ICD-10-CM

## 2020-02-04 DIAGNOSIS — C642 Malignant neoplasm of left kidney, except renal pelvis: Secondary | ICD-10-CM | POA: Diagnosis present

## 2020-02-04 DIAGNOSIS — G4733 Obstructive sleep apnea (adult) (pediatric): Secondary | ICD-10-CM | POA: Diagnosis present

## 2020-02-04 DIAGNOSIS — E1122 Type 2 diabetes mellitus with diabetic chronic kidney disease: Secondary | ICD-10-CM | POA: Diagnosis present

## 2020-02-04 DIAGNOSIS — D631 Anemia in chronic kidney disease: Secondary | ICD-10-CM | POA: Diagnosis present

## 2020-02-04 DIAGNOSIS — M79652 Pain in left thigh: Secondary | ICD-10-CM | POA: Diagnosis present

## 2020-02-04 DIAGNOSIS — I2510 Atherosclerotic heart disease of native coronary artery without angina pectoris: Secondary | ICD-10-CM | POA: Diagnosis present

## 2020-02-04 DIAGNOSIS — K5900 Constipation, unspecified: Secondary | ICD-10-CM | POA: Diagnosis present

## 2020-02-04 DIAGNOSIS — Z825 Family history of asthma and other chronic lower respiratory diseases: Secondary | ICD-10-CM

## 2020-02-04 DIAGNOSIS — I5032 Chronic diastolic (congestive) heart failure: Secondary | ICD-10-CM | POA: Diagnosis present

## 2020-02-04 DIAGNOSIS — N400 Enlarged prostate without lower urinary tract symptoms: Secondary | ICD-10-CM | POA: Diagnosis present

## 2020-02-04 LAB — CBC WITH DIFFERENTIAL/PLATELET
Abs Immature Granulocytes: 0.02 10*3/uL (ref 0.00–0.07)
Basophils Absolute: 0 10*3/uL (ref 0.0–0.1)
Basophils Relative: 0 %
Eosinophils Absolute: 0.1 10*3/uL (ref 0.0–0.5)
Eosinophils Relative: 2 %
HCT: 19.9 % — ABNORMAL LOW (ref 39.0–52.0)
Hemoglobin: 6.2 g/dL — CL (ref 13.0–17.0)
Immature Granulocytes: 0 %
Lymphocytes Relative: 19 %
Lymphs Abs: 1 10*3/uL (ref 0.7–4.0)
MCH: 31.6 pg (ref 26.0–34.0)
MCHC: 31.2 g/dL (ref 30.0–36.0)
MCV: 101.5 fL — ABNORMAL HIGH (ref 80.0–100.0)
Monocytes Absolute: 0.7 10*3/uL (ref 0.1–1.0)
Monocytes Relative: 13 %
Neutro Abs: 3.6 10*3/uL (ref 1.7–7.7)
Neutrophils Relative %: 66 %
Platelets: 170 10*3/uL (ref 150–400)
RBC: 1.96 MIL/uL — ABNORMAL LOW (ref 4.22–5.81)
RDW: 15 % (ref 11.5–15.5)
WBC: 5.5 10*3/uL (ref 4.0–10.5)
nRBC: 0 % (ref 0.0–0.2)

## 2020-02-04 LAB — BASIC METABOLIC PANEL
Anion gap: 12 (ref 5–15)
BUN: 38 mg/dL — ABNORMAL HIGH (ref 8–23)
CO2: 28 mmol/L (ref 22–32)
Calcium: 8.7 mg/dL — ABNORMAL LOW (ref 8.9–10.3)
Chloride: 97 mmol/L — ABNORMAL LOW (ref 98–111)
Creatinine, Ser: 9.57 mg/dL — ABNORMAL HIGH (ref 0.61–1.24)
GFR calc Af Amer: 6 mL/min — ABNORMAL LOW (ref >60)
GFR calc non Af Amer: 5 mL/min — ABNORMAL LOW (ref >60)
Glucose, Bld: 81 mg/dL (ref 70–99)
Potassium: 4.6 mmol/L (ref 3.5–5.1)
Sodium: 137 mmol/L (ref 135–145)

## 2020-02-04 LAB — SARS CORONAVIRUS 2 BY RT PCR (HOSPITAL ORDER, PERFORMED IN ~~LOC~~ HOSPITAL LAB): SARS Coronavirus 2: POSITIVE — AB

## 2020-02-04 LAB — CBG MONITORING, ED: Glucose-Capillary: 74 mg/dL (ref 70–99)

## 2020-02-04 LAB — GLUCOSE, CAPILLARY: Glucose-Capillary: 82 mg/dL (ref 70–99)

## 2020-02-04 LAB — POC OCCULT BLOOD, ED: Fecal Occult Bld: NEGATIVE

## 2020-02-04 LAB — PREPARE RBC (CROSSMATCH)

## 2020-02-04 MED ORDER — TAMSULOSIN HCL 0.4 MG PO CAPS
0.4000 mg | ORAL_CAPSULE | Freq: Every day | ORAL | Status: DC
  Administered 2020-02-05 – 2020-02-09 (×5): 0.4 mg via ORAL
  Filled 2020-02-04 (×5): qty 1

## 2020-02-04 MED ORDER — INSULIN ASPART 100 UNIT/ML ~~LOC~~ SOLN: 0.0000 [IU] | Freq: Every day | SUBCUTANEOUS | Status: DC

## 2020-02-04 MED ORDER — SODIUM CHLORIDE 0.9 % IV SOLN
10.0000 mL/h | Freq: Once | INTRAVENOUS | Status: AC
  Administered 2020-02-04: 10 mL/h via INTRAVENOUS

## 2020-02-04 MED ORDER — HEPARIN SODIUM (PORCINE) 5000 UNIT/ML IJ SOLN
5000.0000 [IU] | Freq: Three times a day (TID) | INTRAMUSCULAR | Status: DC
  Administered 2020-02-05 – 2020-02-06 (×4): 5000 [IU] via SUBCUTANEOUS
  Filled 2020-02-04 (×6): qty 1

## 2020-02-04 MED ORDER — RENA-VITE PO TABS
1.0000 | ORAL_TABLET | Freq: Every day | ORAL | Status: DC
  Administered 2020-02-05 – 2020-02-06 (×3): 1 via ORAL
  Filled 2020-02-04 (×6): qty 1

## 2020-02-04 MED ORDER — ATORVASTATIN CALCIUM 40 MG PO TABS
40.0000 mg | ORAL_TABLET | Freq: Every day | ORAL | Status: DC
  Administered 2020-02-05: 40 mg via ORAL
  Filled 2020-02-04 (×3): qty 1

## 2020-02-04 MED ORDER — INSULIN ASPART 100 UNIT/ML ~~LOC~~ SOLN: 0.0000 [IU] | Freq: Three times a day (TID) | SUBCUTANEOUS | Status: DC

## 2020-02-04 MED ORDER — ACETAMINOPHEN 325 MG PO TABS
650.0000 mg | ORAL_TABLET | Freq: Four times a day (QID) | ORAL | Status: DC | PRN
  Administered 2020-02-05 – 2020-02-08 (×2): 650 mg via ORAL
  Filled 2020-02-04: qty 2

## 2020-02-04 MED ORDER — AMLODIPINE BESYLATE 10 MG PO TABS
10.0000 mg | ORAL_TABLET | Freq: Every day | ORAL | Status: DC
  Administered 2020-02-05 – 2020-02-09 (×4): 10 mg via ORAL
  Filled 2020-02-04 (×4): qty 1

## 2020-02-04 MED ORDER — METOPROLOL TARTRATE 12.5 MG HALF TABLET
12.5000 mg | ORAL_TABLET | Freq: Two times a day (BID) | ORAL | Status: DC
  Administered 2020-02-05 – 2020-02-08 (×5): 12.5 mg via ORAL
  Filled 2020-02-04 (×8): qty 1

## 2020-02-04 MED ORDER — ACETAMINOPHEN 650 MG RE SUPP: 650.0000 mg | Freq: Four times a day (QID) | RECTAL | Status: DC | PRN

## 2020-02-04 NOTE — H&P (Addendum)
History and Physical    [name] [name] YDX:[id number] DOB: Nov 10, 1945 DOA: 02/04/2020  PCP: Nolene Ebbs, MD Patient coming from: Home  Chief Complaint: Generalized weakness  HPI: [name] [name] is a 74 y.o. male with medical history significant of multiple myeloma, renal mass, anemia, paroxysmal A. fib, CAD, ESRD recently started on HD, chronic diastolic CHF, COPD, diet-controlled type 2 diabetes, hypertension, hyperlipidemia, BPH presented to the ED via EMS for evaluation of generalized weakness.  Patient states he has been feeling weak for the past 3 weeks.  Today he went to see his oncologist and was told treatment could not be started for his multiple myeloma as his hemoglobin was too low.  He came into the hospital to get blood transfusions.  Denies hematemesis, hematochezia, or melena.  Denies dizziness/lightheadedness, chest pain, shortness of breath, cough, fevers, chills, nausea, vomiting, abdominal pain, or diarrhea.  ED Course: Afebrile and hemodynamically stable.  No leukocytosis.  Hemoglobin 6.2, previously in the 7-8 range.  FOBT negative.  Potassium 4.6, bicarb 28.  UA pending.  Screening SARS-CoV-2 PCR test positive.  2 units PRBCs ordered in the ED.  Review of Systems:  All systems reviewed and apart from history of presenting illness, are negative.  Past Medical History:  Diagnosis Date  . Abnormal CT of liver    a. nodular contour suggesting cirrhosis 05/2018.  [name] Abnormal LFTs (liver function tests)   . Anxiety   . Aortic root dilatation ([place])   . Ascending aortic aneurysm ([place])   . BPH with urinary obstruction   . Bradycardia   . 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 CAD (coronary artery disease)    a. 12/2015 NSTEMI/Cath (in setting of PAF):  LM nl, LAD 30p,  LCX 80m RCA  ok, AM 100%, RPDA1 40, RPDA 2 60 ->Med Rx.  . Childhood asthma   . Chronic diastolic CHF (congestive heart failure)  ([place]    a. 12/2015 Echo: EF 50-55%, mod AI, mod Ao root dil, mild MR, mod dil LA, mod RA.  . CKD (chronic kidney disease), stage III   . Colon cancer ([place]   . COPD (chronic obstructive pulmonary disease) ([place]    pt denies at preop  . Diabetes mellitus type II    diet controlled  . History of syncope   . Hyperlipidemia   . Hypertensive heart disease   . Kidney lump 04/04/2010   Overview:  Renal Cell Carcinoma   . Light chain myeloma ([facility]   . Moderate aortic insufficiency    a. 12/2015 Echo: Mod AI.  .Marland KitchenParoxysmal atrial fibrillation ([place]    a. 12/2015 started on Xarelto (CHA2DS2VASc = 4-5).  . Pneumonia   . Prostate cancer ([place]   . Renal cell carcinoma ([place]   . Sleep apnea    Does not like  CPAP  . Spinal stenosis in cervical region 10/12/2016  . Venous insufficiency     Past Surgical History:  Procedure Laterality Date  . AV FISTULA PLACEMENT Left 01/26/2020   Procedure: left ARTERIOVENOUS (AV) FISTULA CREATION;  Surgeon: CWaynetta Sandy MD;  [facility]  Service: Vascular;  Laterality: Left;  . BACK SURGERY    . CARDIAC CATHETERIZATION N/A 12/18/2015   Procedure: Left Heart Cath and Coronary Angiography;  Surgeon: DLeonie Man MD;  [facility];  Service: Cardiovascular;  Laterality: N/A;  . DIAGNOSTIC LAPAROSCOPY     partial  colectomy  . IR FLUORO GUIDE CV LINE RIGHT  06/28/2019  . IR FLUORO GUIDE CV LINE RIGHT  01/24/2020  . IR REMOVAL TUN CV CATH W/O FL  07/26/2019  . IR US GUIDE VASC ACCESS RIGHT  06/28/2019  . IR US GUIDE VASC ACCESS RIGHT  01/24/2020  . LAPAROSCOPIC PARTIAL COLECTOMY Right 05/21/2018   Procedure: LAPAROSCOPIC RIGHT  COLECTOMY ERAS PATHWAY;  Surgeon: Leighton Ruff, MD;  Location: [facility];  Service: General;  Laterality: Right;  . [place]   "replaced a disc"     reports that he quit smoking about 42 years ago. His smoking use included cigars. He has never used smokeless tobacco. He reports previous  alcohol use. He reports that he does not use drugs.  Allergies  Allergen Reactions  . Amoxicillin Other (See Comments)    Tolerates Unasyn. Can't move Has patient had a PCN reaction causing immediate rash, facial/tongue/throat swelling, SOB or lightheadedness with hypotension: No Has patient had a PCN reaction causing severe rash involving mucus membranes or skin necrosis: No Has patient had a PCN reaction that required hospitalization No Has patient had a PCN reaction occurring within the last 10 years: No If all of the above answers are "NO", then may proceed with Cephalosporin use.   [name] Penicillins Other (See Comments)    Tolerates Unasyn. Can't move/dizziness Has patient had a PCN reaction causing immediate rash, facial/tongue/throat swelling, SOB or lightheadedness with hypotension: No Has patient had a PCN reaction causing severe rash involving mucus membranes or skin necrosis: No Has patient had a PCN reaction that required hospitalization No Has patient had a PCN reaction occurring within the last 10 years: No If all of the above answers are "NO", then may proceed with Cephalosporin use.      Family History  Problem Relation Age of Onset  . Emphysema Father   . Asthma Father   . Liver disease Father        tumor  . Heart disease Mother   . Hypertension Mother   . Asthma Sister   . Hypertension Son     Prior to Admission medications   Medication Sig Start Date End Date Taking? Authorizing Provider  amLODipine (NORVASC) 10 MG tablet Take 1 tablet (10 mg total) by mouth daily. 01/12/20  Yes [name], DO  atorvastatin (LIPITOR) 40 MG tablet Take 40 mg by mouth daily.   Yes [provider]  cetirizine (ZYRTEC) 10 MG tablet TAKE 1 TABLET (10 MG TOTAL) BY MOUTH DAILY AS NEEDED (SEASONAL ALLERGIES). Patient taking differently: Take 10 mg by mouth daily as needed for allergies (seasonal allergies).  11/12/19  Yes [name], DO   Cholecalciferol (VITAMIN D3 PO) Take 1 tablet by mouth daily.   Yes [provider]  Cyanocobalamin (VITAMIN B-12 PO) Take 1 tablet by mouth daily.   Yes [provider]  glucose blood (ONETOUCH ULTRA) test strip Check FSBS twice a day. Dx: E11.21, N18.32 Patient taking differently: 1 each by Other route See admin instructions. Check FSBS twice a day. Dx: E11.21, N18.32 08/16/19  Yes Fulp, Cammie, MD  HYDROcodone-acetaminophen (NORCO/VICODIN) 5-325 MG tablet Take 1-2 tablets by mouth every 6 (six) hours as needed (gout pain). 12/29/19  Yes Bast, Traci A, NP  multivitamin (RENA-VIT) TABS tablet Take 1 tablet by mouth at bedtime. 01/27/20 04/26/20 Yes [place] Eric J, DO  Omega-3 Fatty Acids (FISH OIL PO) Take 1 capsule by mouth daily.    Yes [provider]  tamsulosin (FLOMAX) 0.4 MG CAPS capsule Take 1 capsule (0.4 mg total) by mouth daily. 10/15/19  Yes [name], DO  metoprolol tartrate (LOPRESSOR) 25 MG tablet Take 0.5 tablets (12.5 mg total) by mouth 2 (two) times daily. Patient not taking: Reported on 02/04/2020 01/27/20 04/26/20  [place] Eric J, [place]    Physical Exam: Vitals:   02/04/20 2103 02/04/20 2118 02/04/20 2130 02/04/20 2145  BP: (!) 143/89 139/89 (!) 146/87 (!) 141/89  Pulse: 78 80 80 77  Resp: 16 16 (!) 21 13  Temp: 98.7 F (37.1 C) 98.3 F (36.8 C)    TempSrc: Oral Oral    SpO2:   95% 98%  Weight:      Height:        Physical Exam  Constitutional: He is oriented to person, place, and time. He appears well-developed and well-nourished. No distress.  HENT:  Head: Normocephalic.  Eyes: Right eye exhibits no discharge. Left eye exhibits no discharge.  Cardiovascular: Normal rate, regular rhythm and intact distal pulses.  Pulmonary/Chest: Effort normal and breath sounds normal. No respiratory distress. He has no wheezes. He has no rales.  Abdominal: Soft. Bowel sounds are normal. He exhibits no distension. There is no abdominal  tenderness. There is no guarding.  Musculoskeletal:        General: No edema.     Cervical back: Neck supple.  Neurological: He is alert and oriented to person, place, and time.  Skin: Skin is warm and dry. He is not diaphoretic.    Labs on Admission: I have personally reviewed following labs and imaging studies  CBC: Recent Labs  Lab 02/04/20 1930  WBC 5.5  NEUTROABS 3.6  HGB 6.2*  HCT 19.9*  MCV 101.5*  PLT 440   Basic Metabolic Panel: Recent Labs  Lab 02/04/20 1930  NA 137  K 4.6  CL 97*  CO2 28  GLUCOSE 81  BUN 38*  CREATININE 9.57*  CALCIUM 8.7*   GFR: Estimated Creatinine Clearance: 7.3 mL/min (A) (by C-G formula based on SCr of 9.57 mg/dL (H)). Liver Function Tests: No results for input(s): AST, ALT, ALKPHOS, BILITOT, PROT, ALBUMIN in the last 168 hours. No results for input(s): LIPASE, AMYLASE in the last 168 hours. No results for input(s): AMMONIA in the last 168 hours. Coagulation Profile: No results for input(s): INR, PROTIME in the last 168 hours. Cardiac Enzymes: No results for input(s): CKTOTAL, CKMB, CKMBINDEX, TROPONINI in the last 168 hours. BNP (last 3 results) No results for input(s): PROBNP in the last 8760 hours. HbA1C: No results for input(s): HGBA1C in the last 72 hours. CBG: Recent Labs  Lab 02/04/20 1942  GLUCAP 74   Lipid Profile: No results for input(s): CHOL, HDL, LDLCALC, TRIG, CHOLHDL, LDLDIRECT in the last 72 hours. Thyroid Function Tests: No results for input(s): TSH, T4TOTAL, FREET4, T3FREE, THYROIDAB in the last 72 hours. Anemia Panel: No results for input(s): VITAMINB12, FOLATE, FERRITIN, TIBC, IRON, RETICCTPCT in the last 72 hours. Urine analysis:    Component Value Date/Time   COLORURINE STRAW (A) 01/22/2020 0505   APPEARANCEUR CLEAR 01/22/2020 0505   LABSPEC 1.010 01/22/2020 0505   PHURINE 6.0 01/22/2020 0505   GLUCOSEU NEGATIVE 01/22/2020 0505   HGBUR MODERATE (A) 01/22/2020 0505   BILIRUBINUR NEGATIVE  01/22/2020 0505   KETONESUR NEGATIVE 01/22/2020 0505   PROTEINUR 30 (A) 01/22/2020 0505   NITRITE NEGATIVE 01/22/2020 0505   LEUKOCYTESUR NEGATIVE 01/22/2020 0505    Radiological Exams on Admission: No results  found.  Assessment/Plan Principal Problem:   Symptomatic anemia Active Problems:   Essential (primary) hypertension   BPH (benign prostatic hyperplasia)   Diabetes ([place])   ESRD (end stage renal disease) (H[facility])   Acute on chronic symptomatic anemia: Suspect related to end-stage renal disease and multiple myeloma.  Patient is not endorsing any symptoms of GI bleed.  FOBT negative.  Hemoglobin 6.2, previously in the 7-8 range.  Hemodynamically stable. -Type and screen, 2 units PRBCs ordered in the ED.  Check posttransfusion H&H.  Screening SARS-CoV-2 PCR test positive: Patient is endorsing generalized weakness in the setting of worsening anemia.  Not endorsing any other symptoms of a viral illness.  Denies cough or shortness of breath.  No signs of respiratory distress.  Not tachypneic or hypoxic.  Lungs clear on exam.  No fever or tachycardia. -Order chest x-ray and treat if there are signs of Covid pneumonia.  Continuous pulse ox.  Airborne and contact precautions. ADDENDUM: Also check inflammatory markers. Addendum: Chest x-ray with findings concerning for multifocal pneumonia.  Start Decadron and remdesivir.  Bacterial pneumonia less likely given no leukocytosis.  Check procalcitonin level.  ESRD on HD TTS: Potassium and bicarb normal.  Patient is not volume overloaded.  No indication for urgent dialysis at this time. -Please consult nephrology in a.m. for routine dialysis  Multiple myeloma: Seen by oncology on 5/24 and due to his poor performance status and noncompliance he is felt to be a poor candidate for systemic myeloma treatment but if he is agreeable/tolerant with ongoing HD and has a clear plan to address his renal mass, oncology would consider restarting systemic myeloma  treatment as outpatient. -Please ensure follow-up with oncology  History of left renal mass: Highly suspicious for renal cell carcinoma and patient has declined therapy in the past. -Please ensure follow-up with urology and oncology  Diet-controlled type 2 diabetes -Sliding scale insulin very sensitive ACHS and CBG checks.  Hypertension: Stable. -Continue home amlodipine and metoprolol.  Hyperlipidemia -Continue home Lipitor  BPH -Continue Flomax  DVT prophylaxis: Subcutaneous heparin Code Status: Patient wishes to be full code. Family Communication: No family available at this time. Disposition Plan: Status is: Observation  The patient remains OBS appropriate and will d/c before 2 midnights.  Dispo: The patient is from: Home              Anticipated d/c is to: Home              Anticipated d/c date is: 1 day              Patient currently is not medically stable to d/c.  The medical decision making on this patient was of high complexity and the patient is at high risk for clinical deterioration, therefore this is a level 3 visit.  Shela Leff MD [facility]  If 7PM-7AM, please contact night-coverage [email or web address]  02/04/2020, 9:54 PM

## 2020-02-04 NOTE — ED Provider Notes (Signed)
[facility] EMERGENCY DEPARTMENT Provider Note   CSN: [id number] Arrival date & time: 02/04/20  1913     History No chief complaint on file.   [name] [name] is a 74 y.o. male.  The history is provided by the patient and medical records. No language interpreter was used.     74 year old male with history of stage IV myeloma, paroxysmal atrial fibrillation, CAD, end-stage renal disease currently a new dialysis patient, CHF, brought here via EMS for evaluation of weakness.  Patient recently started dialysis 2 weeks ago via a PermCath TTS.  He saw his hematologist today for a routine appointment.  He was complaining of being weak and his legs are not strong enough to carry him.  He does report some shortness of breath with exertion.  Patient was found to have a hemoglobin of 6.2.  Initial plan was to have patient transfused 2 units of packed red blood cells on Sunday, 02/05/2020.  Patient however felt that he is weak and would like to get transfusion now.  His doctor recommend patient to come to ER to receive blood transfusion.  He mentioned he knows he needs blood transfusion because he has had increased weakness for more than a month, and states that he has bilateral thigh pain and leg pain.  He does endorse some occasional constipation but none have not noticed any GI bleeding.  No other abnormal bleeding.  He has not taken his Xarelto due to kidney issue.      Past Medical History:  Diagnosis Date  . Abnormal CT of liver    a. nodular contour suggesting cirrhosis 05/2018.  [name] Abnormal LFTs (liver function tests)   . Anxiety   . Aortic root dilatation ([place])   . Ascending aortic aneurysm ([place])   . BPH with urinary obstruction   . Bradycardia   . 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 CAD (coronary artery disease)    a. 12/2015 NSTEMI/Cath (in setting of PAF):  LM nl, LAD 30p,  LCX 36m, RCA   ok, AM 100%, RPDA1 40, RPDA 2 60 ->Med Rx.  . Childhood asthma   . Chronic diastolic CHF (congestive heart failure) ([place])    a. 12/2015 Echo: EF 50-55%, mod AI, mod Ao root dil, mild MR, mod dil LA, mod RA.  . CKD (chronic kidney disease), stage III   . Colon cancer ([place])   . COPD (chronic obstructive pulmonary disease) ([place])    pt denies at preop  . Diabetes mellitus type II    diet controlled  . History of syncope   . Hyperlipidemia   . Hypertensive heart disease   . Kidney lump 04/04/2010   Overview:  Renal Cell Carcinoma   . 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 Moderate aortic insufficiency    a. 12/2015 Echo: Mod AI.  [name] Paroxysmal atrial fibrillation ([place])    a. 12/2015 started on Xarelto (CHA2DS2VASc = 4-5).  . Pneumonia   . Prostate cancer ([place])   . Renal cell carcinoma ([place])   . Sleep apnea    Does not like  CPAP  . Spinal stenosis in cervical region 10/12/2016  . Venous insufficiency     Patient Active Problem List   Diagnosis Date Noted  . ESRD on hemodialysis ([place]) 01/27/2020  . Goals of care, counseling/discussion   . Palliative care by specialist   . DNR (do not resuscitate) discussion   .  ARF (acute renal failure) ([place]) 01/22/2020  . Ascending aortic aneurysm ([place]) 08/04/2019  . Myeloma kidney ([place])   . CKD (chronic kidney disease), stage III 06/04/2019  . Renal cell carcinoma ([place]) 06/04/2019  . Gross hematuria 06/04/2019  . COPD exacerbation ([place]) 11/05/2018  . Palpitations 10/22/2018  . Counseling regarding advance care planning and goals of care 06/22/2018  . AKI (acute kidney injury) ([place])   . Anemia   . Sepsis ([place]) 06/05/2018  . Acute lower UTI 06/05/2018  . Colon polyp 05/21/2018  . Aortic valve regurgitation 04/14/2018  . Medication management 04/14/2018  . Thoracic aortic aneurysm without rupture ([place]) 09/29/2017  . 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 Head trauma,  subsequent encounter 10/12/2016  . 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 Spinal stenosis in cervical region 10/12/2016  . Cervical spondylosis 10/12/2016  . CAD (coronary artery disease)   . Chronic diastolic CHF (congestive heart failure) ([place])   . Hyperlipidemia   . Hypertensive heart disease   . Paroxysmal atrial fibrillation ([place]) 12/17/2015  . 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 Mild intermittent asthma 10/18/2015  . Aortic root dilatation ([place]) 10/17/2015  . Diabetes ([place]) 10/17/2015  . 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 Hand paresthesia 12/09/2014  . Elevated prostate specific antigen (PSA) 07/12/2014  . Allergic rhinitis 07/12/2014  . LBP (low back pain) 07/12/2014  . Palpitation 07/11/2014  . Patient's other noncompliance with medication regimen 08/10/2013  . Chronic venous insufficiency 07/23/2012  . Obstructive apnea 04/11/2011  . Essential (primary) hypertension 09/07/2010  . 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 Anxiety, generalized 04/04/2010  . Cardiac conduction disorder 02/27/2010  . BPH (benign prostatic hyperplasia) 09/13/2009  . Abnormal findings on examination of genitourinary organs 08/09/2009  . Hereditary and idiopathic neuropathy 07/05/2009    Past Surgical History:  Procedure Laterality Date  . AV FISTULA PLACEMENT Left 01/26/2020   Procedure: left ARTERIOVENOUS (AV) FISTULA CREATION;  Surgeon: Waynetta Sandy, MD;  Location: [facility];  Service: Vascular;  Laterality: Left;  . BACK SURGERY    . CARDIAC CATHETERIZATION N/A 12/18/2015   Procedure: Left Heart Cath and Coronary Angiography;  Surgeon: Leonie Man, MD;  Location: [facility];  Service: Cardiovascular;  Laterality: N/A;  . DIAGNOSTIC LAPAROSCOPY     partial colectomy  . IR FLUORO GUIDE CV LINE RIGHT  06/28/2019  . IR FLUORO GUIDE CV LINE RIGHT  01/24/2020  . IR REMOVAL TUN CV CATH W/O FL  07/26/2019  . IR  US GUIDE VASC ACCESS RIGHT  06/28/2019  . IR US GUIDE VASC ACCESS RIGHT  01/24/2020  . LAPAROSCOPIC PARTIAL COLECTOMY Right 05/21/2018   Procedure: LAPAROSCOPIC RIGHT  COLECTOMY ERAS PATHWAY;  Surgeon: Leighton Ruff, MD;  Location: [facility];  Service: General;  Laterality: Right;  . [place]   "replaced a disc"       Family History  Problem Relation Age of Onset  . Emphysema Father   . Asthma Father   . Liver disease Father        tumor  . Heart disease Mother   . Hypertension Mother   . Asthma Sister   . Hypertension Son     Social History   Tobacco Use  . Smoking status: Former Smoker    Types: Cigars    Quit date: 09/02/1977    Years since  quitting: 42.4  . Smokeless tobacco: Never Used  . Tobacco comment:    Substance Use Topics  . Alcohol use: Not Currently    Comment: "stopped drinking alcohol in ~ 1980; just drank a little on the weekends when I did drink"  . Drug use: No    Home Medications Prior to Admission medications   Medication Sig Start Date End Date Taking? Authorizing Provider  amLODipine (NORVASC) 10 MG tablet Take 1 tablet (10 mg total) by mouth daily. 01/12/20   [name], DO  atorvastatin (LIPITOR) 40 MG tablet Take 40 mg by mouth daily.    [provider]  cetirizine (ZYRTEC) 10 MG tablet TAKE 1 TABLET (10 MG TOTAL) BY MOUTH DAILY AS NEEDED (SEASONAL ALLERGIES). Patient taking differently: Take 10 mg by mouth daily as needed for allergies (seasonal allergies).  11/12/19   [name], DO  Cholecalciferol (VITAMIN D3 PO) Take 1 tablet by mouth daily.    [provider]  Cyanocobalamin (VITAMIN B-12 PO) Take 1 tablet by mouth daily.    [provider]  glucose blood (ONETOUCH ULTRA) test strip Check FSBS twice a day. Dx: E11.21, N18.32 08/16/19   Fulp, Ander Gaster, MD  HYDROcodone-acetaminophen (NORCO/VICODIN) 5-325 MG tablet Take 1-2 tablets by mouth every 6 (six) hours as needed (gout  pain). 12/29/19   Loura Halt A, NP  metoprolol tartrate (LOPRESSOR) 25 MG tablet Take 0.5 tablets (12.5 mg total) by mouth 2 (two) times daily. 01/27/20 04/26/20  [place] Eric J, DO  multivitamin (RENA-VIT) TABS tablet Take 1 tablet by mouth at bedtime. 01/27/20 04/26/20  [place] Eric J, DO  Omega-3 Fatty Acids (FISH OIL PO) Take 1 capsule by mouth daily.     [provider]  tamsulosin (FLOMAX) 0.4 MG CAPS capsule Take 1 capsule (0.4 mg total) by mouth daily. 10/15/19   [name], DO    Allergies    Amoxicillin and Penicillins  Review of Systems   Review of Systems  All other systems reviewed and are negative.   Physical Exam Updated Vital Signs BP 139/81 (BP Location: Right Arm)   Pulse 65   Resp 16   Ht 5\' 9"  (1.753 m)   Wt 88 kg   SpO2 96%   BMI 28.65 kg/m   Physical Exam Vitals and nursing note reviewed.  Constitutional:      General: He is not in acute distress.    Appearance: He is well-developed.     Comments: Elderly male laying in bed in no acute discomfort.  HENT:     Head: Atraumatic.  Eyes:     Conjunctiva/sclera: Conjunctivae normal.     Comments: Prominent exophthalmos involving bilateral eyes  Cardiovascular:     Rate and Rhythm: Normal rate and regular rhythm.     Pulses: Normal pulses.     Heart sounds: Normal heart sounds.  Pulmonary:     Effort: Pulmonary effort is normal.     Breath sounds: Normal breath sounds.     Comments: Permacath noted to right anterior chest wall without sign of infection. Abdominal:     Palpations: Abdomen is soft.     Tenderness: There is no abdominal tenderness.     Comments: Easily reducible ventral hernia to mid abdomen  Musculoskeletal:        General: Swelling (Trace edema to bilateral lower extremities.) present.     Cervical back: Neck supple.     Comments: 5 out of 5 strength all 4 extremities.  Skin:  Findings: No rash.  Neurological:     Mental Status: He is alert. Mental status is  at baseline.  Psychiatric:        Mood and Affect: Mood normal.     ED Results / Procedures / Treatments   Labs (all labs ordered are listed, but only abnormal results are displayed) Labs Reviewed  CBC WITH DIFFERENTIAL/PLATELET - Abnormal; Notable for the following components:      Result Value   RBC 1.96 (*)    Hemoglobin 6.2 (*)    HCT 19.9 (*)    MCV 101.5 (*)    All other components within normal limits  BASIC METABOLIC PANEL - Abnormal; Notable for the following components:   Chloride 97 (*)    BUN 38 (*)    Creatinine, Ser 9.57 (*)    Calcium 8.7 (*)    GFR calc non Af Amer 5 (*)    GFR calc Af Amer 6 (*)    All other components within normal limits  SARS CORONAVIRUS 2 BY RT PCR (HOSPITAL ORDER, [facility])  URINALYSIS, ROUTINE W REFLEX MICROSCOPIC  CBG MONITORING, ED  POC OCCULT BLOOD, ED  TYPE AND SCREEN  PREPARE RBC (CROSSMATCH)    EKG None  Radiology No results found.  Procedures .Critical Care Performed by: Domenic Moras, PA-C Authorized by: Domenic Moras, PA-C   Critical care provider statement:    Critical care time (minutes):  31   Critical care was time spent personally by me on the following activities:  Discussions with consultants, evaluation of patient's response to treatment, examination of patient, ordering and performing treatments and interventions, ordering and review of laboratory studies, ordering and review of radiographic studies, pulse oximetry, re-evaluation of patient's condition, obtaining history from patient or surrogate and review of old charts   (including critical care time)  Medications Ordered in ED Medications  0.9 %  sodium chloride infusion (has no administration in time range)    ED Course  I have reviewed the triage vital signs and the nursing notes.  Pertinent labs & imaging results that were available during my care of the patient were reviewed by me and considered in my medical decision  making (see chart for details).    MDM Rules/Calculators/A&P                      BP 139/81 (BP Location: Right Arm)   Pulse 82   Temp 98.7 F (37.1 C) (Oral)   Resp 16   Ht 5\' 9"  (1.753 m)   Wt 88 kg   SpO2 97%   BMI 28.65 kg/m   Final Clinical Impression(s) / ED Diagnoses Final diagnoses:  Symptomatic anemia    Rx / DC Orders ED Discharge Orders    None     8:29 PM Patient here requesting for blood transfusion.  Known history of chronic anemia likely secondary to end-stage renal disease as well as stage IV myeloma.  Endorsed generalized weakness ongoing for more than a month likely attributing to the stage of his anemia.  Hemoglobin today is 6.2. Will transfuse blood and will consult for admission.  Care discussed with Dr. Tamera Punt.   No GI bleed, hemoccult negative.   I have reviewed previous note from patient's hematology who plan to have patient dialyzed tomorrow, as well as getting blood transfusion 2 days from now.  In the setting of symptomatic anemia, will initiate blood transfusion.  9:07 PM Appreciate consultation from [facility]  hospitalist, Dr. Marlowe Sax who agrees to admit patient for further management of his condition.  Screening COVID-19 test ordered.    Chia A Notch was evaluated in Emergency Department on 02/04/2020 for the symptoms described in the history of present illness. He was evaluated in the context of the global COVID-19 pandemic, which necessitated consideration that the patient might be at risk for infection with the SARS-CoV-2 virus that causes COVID-19. Institutional protocols and algorithms that pertain to the evaluation of patients at risk for COVID-19 are in a state of rapid change based on information released by regulatory bodies including the CDC and federal and state organizations. These policies and algorithms were followed during the patient's care in the ED.    Domenic Moras, PA-C 02/04/20 2109    Malvin Johns, MD 02/05/20 1504

## 2020-02-04 NOTE — ED Triage Notes (Signed)
Pt came in [facility] sent by PCP d/t weakness. Pt is a new dialysis pt that started treatment two weeks ago. Pt also has Stage IV myeloma but can  Not start treatment d/t decreased RBC. PCP recommended coming to get a blood transfusion.

## 2020-02-04 NOTE — ED Notes (Signed)
Blood consent form at bedside

## 2020-02-05 ENCOUNTER — Encounter (HOSPITAL_COMMUNITY): Payer: Self-pay | Admitting: Internal Medicine

## 2020-02-05 ENCOUNTER — Observation Stay (HOSPITAL_COMMUNITY): Payer: Medicare Other

## 2020-02-05 ENCOUNTER — Other Ambulatory Visit: Payer: Self-pay

## 2020-02-05 DIAGNOSIS — E1169 Type 2 diabetes mellitus with other specified complication: Secondary | ICD-10-CM

## 2020-02-05 DIAGNOSIS — D849 Immunodeficiency, unspecified: Secondary | ICD-10-CM | POA: Diagnosis present

## 2020-02-05 DIAGNOSIS — J1282 Pneumonia due to coronavirus disease 2019: Secondary | ICD-10-CM | POA: Diagnosis present

## 2020-02-05 DIAGNOSIS — I5032 Chronic diastolic (congestive) heart failure: Secondary | ICD-10-CM | POA: Diagnosis present

## 2020-02-05 DIAGNOSIS — C642 Malignant neoplasm of left kidney, except renal pelvis: Secondary | ICD-10-CM | POA: Diagnosis present

## 2020-02-05 DIAGNOSIS — D649 Anemia, unspecified: Secondary | ICD-10-CM | POA: Diagnosis not present

## 2020-02-05 DIAGNOSIS — C9000 Multiple myeloma not having achieved remission: Secondary | ICD-10-CM | POA: Diagnosis present

## 2020-02-05 DIAGNOSIS — N400 Enlarged prostate without lower urinary tract symptoms: Secondary | ICD-10-CM | POA: Diagnosis present

## 2020-02-05 DIAGNOSIS — D631 Anemia in chronic kidney disease: Secondary | ICD-10-CM | POA: Diagnosis present

## 2020-02-05 DIAGNOSIS — E785 Hyperlipidemia, unspecified: Secondary | ICD-10-CM | POA: Diagnosis present

## 2020-02-05 DIAGNOSIS — Z8616 Personal history of COVID-19: Secondary | ICD-10-CM | POA: Diagnosis not present

## 2020-02-05 DIAGNOSIS — Z992 Dependence on renal dialysis: Secondary | ICD-10-CM | POA: Diagnosis not present

## 2020-02-05 DIAGNOSIS — H0520 Unspecified exophthalmos: Secondary | ICD-10-CM | POA: Diagnosis present

## 2020-02-05 DIAGNOSIS — J440 Chronic obstructive pulmonary disease with acute lower respiratory infection: Secondary | ICD-10-CM | POA: Diagnosis present

## 2020-02-05 DIAGNOSIS — I480 Paroxysmal atrial fibrillation: Secondary | ICD-10-CM | POA: Diagnosis present

## 2020-02-05 DIAGNOSIS — N186 End stage renal disease: Secondary | ICD-10-CM

## 2020-02-05 DIAGNOSIS — I10 Essential (primary) hypertension: Secondary | ICD-10-CM | POA: Diagnosis not present

## 2020-02-05 DIAGNOSIS — M79651 Pain in right thigh: Secondary | ICD-10-CM | POA: Diagnosis present

## 2020-02-05 DIAGNOSIS — U071 COVID-19: Secondary | ICD-10-CM | POA: Diagnosis present

## 2020-02-05 DIAGNOSIS — I132 Hypertensive heart and chronic kidney disease with heart failure and with stage 5 chronic kidney disease, or end stage renal disease: Secondary | ICD-10-CM | POA: Diagnosis present

## 2020-02-05 DIAGNOSIS — I2510 Atherosclerotic heart disease of native coronary artery without angina pectoris: Secondary | ICD-10-CM | POA: Diagnosis present

## 2020-02-05 DIAGNOSIS — I252 Old myocardial infarction: Secondary | ICD-10-CM | POA: Diagnosis not present

## 2020-02-05 DIAGNOSIS — G4733 Obstructive sleep apnea (adult) (pediatric): Secondary | ICD-10-CM | POA: Diagnosis present

## 2020-02-05 DIAGNOSIS — M79652 Pain in left thigh: Secondary | ICD-10-CM | POA: Diagnosis present

## 2020-02-05 DIAGNOSIS — E1122 Type 2 diabetes mellitus with diabetic chronic kidney disease: Secondary | ICD-10-CM | POA: Diagnosis present

## 2020-02-05 DIAGNOSIS — K439 Ventral hernia without obstruction or gangrene: Secondary | ICD-10-CM | POA: Diagnosis present

## 2020-02-05 DIAGNOSIS — K5900 Constipation, unspecified: Secondary | ICD-10-CM | POA: Diagnosis present

## 2020-02-05 LAB — CBC
HCT: 23.9 % — ABNORMAL LOW (ref 39.0–52.0)
Hemoglobin: 7.9 g/dL — ABNORMAL LOW (ref 13.0–17.0)
MCH: 32 pg (ref 26.0–34.0)
MCHC: 33.1 g/dL (ref 30.0–36.0)
MCV: 96.8 fL (ref 80.0–100.0)
Platelets: 195 K/uL (ref 150–400)
RBC: 2.47 MIL/uL — ABNORMAL LOW (ref 4.22–5.81)
RDW: 15.7 % — ABNORMAL HIGH (ref 11.5–15.5)
WBC: 6.3 K/uL (ref 4.0–10.5)
nRBC: 0 % (ref 0.0–0.2)

## 2020-02-05 LAB — URINALYSIS, ROUTINE W REFLEX MICROSCOPIC
Bacteria, UA: NONE SEEN
Bilirubin Urine: NEGATIVE
Glucose, UA: NEGATIVE mg/dL
Ketones, ur: NEGATIVE mg/dL
Nitrite: NEGATIVE
Protein, ur: 30 mg/dL — AB
Specific Gravity, Urine: 1.006 (ref 1.005–1.030)
pH: 8 (ref 5.0–8.0)

## 2020-02-05 LAB — MRSA PCR SCREENING: MRSA by PCR: NEGATIVE

## 2020-02-05 LAB — BASIC METABOLIC PANEL
Anion gap: 13 (ref 5–15)
BUN: 46 mg/dL — ABNORMAL HIGH (ref 8–23)
CO2: 27 mmol/L (ref 22–32)
Calcium: 8.8 mg/dL — ABNORMAL LOW (ref 8.9–10.3)
Chloride: 96 mmol/L — ABNORMAL LOW (ref 98–111)
Creatinine, Ser: 10.45 mg/dL — ABNORMAL HIGH (ref 0.61–1.24)
GFR calc Af Amer: 5 mL/min — ABNORMAL LOW (ref >60)
GFR calc non Af Amer: 4 mL/min — ABNORMAL LOW (ref >60)
Glucose, Bld: 134 mg/dL — ABNORMAL HIGH (ref 70–99)
Potassium: 4.9 mmol/L (ref 3.5–5.1)
Sodium: 136 mmol/L (ref 135–145)

## 2020-02-05 LAB — C-REACTIVE PROTEIN: CRP: 2.6 mg/dL — ABNORMAL HIGH (ref <1.0)

## 2020-02-05 LAB — GLUCOSE, CAPILLARY
Glucose-Capillary: 88 mg/dL (ref 70–99)
Glucose-Capillary: 125 mg/dL — ABNORMAL HIGH (ref 70–99)
Glucose-Capillary: 154 mg/dL — ABNORMAL HIGH (ref 70–99)
Glucose-Capillary: 105 mg/dL — ABNORMAL HIGH (ref 70–99)
Glucose-Capillary: 119 mg/dL — ABNORMAL HIGH (ref 70–99)

## 2020-02-05 LAB — FIBRINOGEN: Fibrinogen: 440 mg/dL (ref 210–475)

## 2020-02-05 LAB — FERRITIN: Ferritin: 894 ng/mL — ABNORMAL HIGH (ref 24–336)

## 2020-02-05 LAB — D-DIMER, QUANTITATIVE: D-Dimer, Quant: 4.5 ug/mL-FEU — ABNORMAL HIGH (ref 0.00–0.50)

## 2020-02-05 LAB — PROCALCITONIN: Procalcitonin: 0.41 ng/mL

## 2020-02-05 MED ORDER — ALPRAZOLAM 0.25 MG PO TABS
0.2500 mg | ORAL_TABLET | Freq: Three times a day (TID) | ORAL | Status: DC | PRN
  Administered 2020-02-05: 0.25 mg via ORAL
  Filled 2020-02-05 (×2): qty 1

## 2020-02-05 MED ORDER — CHLORHEXIDINE GLUCONATE CLOTH 2 % EX PADS
6.0000 | MEDICATED_PAD | Freq: Every day | CUTANEOUS | Status: DC
  Administered 2020-02-05 – 2020-02-08 (×4): 6 via TOPICAL

## 2020-02-05 MED ORDER — SODIUM CHLORIDE 0.9 % IV SOLN
200.0000 mg | Freq: Once | INTRAVENOUS | Status: AC
  Administered 2020-02-05: 200 mg via INTRAVENOUS
  Filled 2020-02-05: qty 40

## 2020-02-05 MED ORDER — NEPRO/CARBSTEADY PO LIQD
237.0000 mL | Freq: Two times a day (BID) | ORAL | Status: DC
  Administered 2020-02-05 – 2020-02-09 (×7): 237 mL via ORAL

## 2020-02-05 MED ORDER — DEXAMETHASONE SODIUM PHOSPHATE 10 MG/ML IJ SOLN
6.0000 mg | INTRAMUSCULAR | Status: DC
  Administered 2020-02-05 – 2020-02-07 (×3): 6 mg via INTRAVENOUS
  Filled 2020-02-05 (×4): qty 1

## 2020-02-05 MED ORDER — PANTOPRAZOLE SODIUM 40 MG PO TBEC
40.0000 mg | DELAYED_RELEASE_TABLET | Freq: Every day | ORAL | Status: DC
  Administered 2020-02-05 – 2020-02-08 (×3): 40 mg via ORAL
  Filled 2020-02-05 (×3): qty 1

## 2020-02-05 MED ORDER — SODIUM CHLORIDE 0.9 % IV SOLN
100.0000 mg | Freq: Every day | INTRAVENOUS | Status: DC
  Administered 2020-02-06 – 2020-02-08 (×3): 100 mg via INTRAVENOUS
  Filled 2020-02-05 (×4): qty 20

## 2020-02-05 NOTE — Consult Note (Addendum)
Renal Service Consult Note [facility]  DAVIT VASSAR 02/05/2020 [name] Requesting Physician:  Dr Waldron Labs  Reason for Consult:  ESRD pt w/ new COVID admit HPI: The patient is a 74 y.o. year-old w/ hx of PAF, prostate Ca, mult myeloma, DM2, COPD, ESRD HD recently started HD, diast HF, BPH presented to ED w/ gen'd weakness and found to be anemic w/ Hb in 6's. Also pt found to be COVID+ (has already had 1 covid+ illness in wintertime).  Pt admitted to get 2u prbc's and dialysis as needed. CXR showed pathcy infiltrates so pt also will be receiving IV remdesivir and decadron. Asked to see for dialysis.   Pt seen in room. Denies any SOB or cough, CP or abd pain, no n/v/d.  Groggy.   ROS  denies CP  no joint pain   no HA  no blurry vision  no rash  no diarrhea  no nausea/ vomiting  no dysuria  no difficulty voiding  no change in urine color    Past Medical History  Past Medical History:  Diagnosis Date  . Abnormal CT of liver    a. nodular contour suggesting cirrhosis 05/2018.  [name] Abnormal LFTs (liver function tests)   . Anxiety   . Aortic root dilatation ([place])   . Ascending aortic aneurysm ([place])   . BPH with urinary obstruction   . Bradycardia   . 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 CAD (coronary artery disease)    a. 12/2015 NSTEMI/Cath (in setting of PAF):  LM nl, LAD 30p,  LCX 52m RCA  ok, AM 100%, RPDA1 40, RPDA 2 60 ->Med Rx.  . Childhood asthma   . Chronic diastolic CHF (congestive heart failure) ([place]    a. 12/2015 Echo: EF 50-55%, mod AI, mod Ao root dil, mild MR, mod dil LA, mod RA.  . CKD (chronic kidney disease), stage III   . Colon cancer ([place]   . COPD (chronic obstructive pulmonary disease) ([place]    pt denies at preop  . Diabetes mellitus type II    diet controlled  . History of syncope   . Hyperlipidemia   . Hypertensive heart disease   . Kidney lump  04/04/2010   Overview:  Renal Cell Carcinoma   . Light chain myeloma ([place]   . Moderate aortic insufficiency    a. 12/2015 Echo: Mod AI.  .Marland KitchenParoxysmal atrial fibrillation ([place]    a. 12/2015 started on Xarelto (CHA2DS2VASc = 4-5).  . Pneumonia   . Prostate cancer ([place]   . Renal cell carcinoma ([place]   . Sleep apnea    Does not like  CPAP  . Spinal stenosis in cervical region 10/12/2016  . Venous insufficiency    Past Surgical History  Past Surgical History:  Procedure Laterality Date  . AV FISTULA PLACEMENT Left 01/26/2020   Procedure: left ARTERIOVENOUS (AV) FISTULA CREATION;  Surgeon: CWaynetta Sandy MD;  [facility]  Service: Vascular;  Laterality: Left;  . BACK SURGERY    . CARDIAC CATHETERIZATION N/A 12/18/2015   Procedure: Left Heart Cath and Coronary Angiography;  Surgeon: DLeonie Man MD;  [facility];  Service: Cardiovascular;  Laterality: N/A;  . DIAGNOSTIC LAPAROSCOPY     partial colectomy  . IR FLUORO GUIDE CV LINE RIGHT  06/28/2019  . IR FLUORO GUIDE CV LINE RIGHT  01/24/2020  . IR REMOVAL TUN  CV CATH W/O FL  07/26/2019  . IR US GUIDE VASC ACCESS RIGHT  06/28/2019  . IR US GUIDE VASC ACCESS RIGHT  01/24/2020  . LAPAROSCOPIC PARTIAL COLECTOMY Right 05/21/2018   Procedure: LAPAROSCOPIC RIGHT  COLECTOMY ERAS PATHWAY;  Surgeon: Leighton Ruff, MD;  [facility];  Service: General;  Laterality: Right;  . [place]   "replaced a disc"   Family History  Family History  Problem Relation Age of Onset  . Emphysema Father   . Asthma Father   . Liver disease Father        tumor  . Heart disease Mother   . Hypertension Mother   . Asthma Sister   . Hypertension Son    Social History  reports that he quit smoking about 42 years ago. His smoking use included cigars. He has never used smokeless tobacco. He reports previous alcohol use. He reports that he does not use drugs. Allergies  Allergies  Allergen Reactions  .  Amoxicillin Other (See Comments)    Tolerates Unasyn. Can't move Has patient had a PCN reaction causing immediate rash, facial/tongue/throat swelling, SOB or lightheadedness with hypotension: No Has patient had a PCN reaction causing severe rash involving mucus membranes or skin necrosis: No Has patient had a PCN reaction that required hospitalization No Has patient had a PCN reaction occurring within the last 10 years: No If all of the above answers are "NO", then may proceed with Cephalosporin use.   [name] Penicillins Other (See Comments)    Tolerates Unasyn. Can't move/dizziness Has patient had a PCN reaction causing immediate rash, facial/tongue/throat swelling, SOB or lightheadedness with hypotension: No Has patient had a PCN reaction causing severe rash involving mucus membranes or skin necrosis: No Has patient had a PCN reaction that required hospitalization No Has patient had a PCN reaction occurring within the last 10 years: No If all of the above answers are "NO", then may proceed with Cephalosporin use.     Home medications Prior to Admission medications   Medication Sig Start Date End Date Taking? Authorizing Provider  amLODipine (NORVASC) 10 MG tablet Take 1 tablet (10 mg total) by mouth daily. 01/12/20  Yes [name], DO  atorvastatin (LIPITOR) 40 MG tablet Take 40 mg by mouth daily.   Yes [provider]  cetirizine (ZYRTEC) 10 MG tablet TAKE 1 TABLET (10 MG TOTAL) BY MOUTH DAILY AS NEEDED (SEASONAL ALLERGIES). Patient taking differently: Take 10 mg by mouth daily as needed for allergies (seasonal allergies).  11/12/19  Yes [name], DO  Cholecalciferol (VITAMIN D3 PO) Take 1 tablet by mouth daily.   Yes [provider]  Cyanocobalamin (VITAMIN B-12 PO) Take 1 tablet by mouth daily.   Yes [provider]  glucose blood (ONETOUCH ULTRA) test strip Check FSBS twice a day. Dx: E11.21, N18.32 Patient taking differently: 1  each by Other route See admin instructions. Check FSBS twice a day. Dx: E11.21, N18.32 08/16/19  Yes Fulp, Cammie, MD  HYDROcodone-acetaminophen (NORCO/VICODIN) 5-325 MG tablet Take 1-2 tablets by mouth every 6 (six) hours as needed (gout pain). 12/29/19  Yes Bast, Traci A, NP  multivitamin (RENA-VIT) TABS tablet Take 1 tablet by mouth at bedtime. 01/27/20 04/26/20 Yes [place] Eric J, DO  Omega-3 Fatty Acids (FISH OIL PO) Take 1 capsule by mouth daily.    Yes [provider]  tamsulosin (FLOMAX) 0.4 MG CAPS capsule Take 1 capsule (0.4 mg total) by mouth daily. 10/15/19  Yes  [name], DO  metoprolol tartrate (LOPRESSOR) 25 MG tablet Take 0.5 tablets (12.5 mg total) by mouth 2 (two) times daily. Patient not taking: Reported on 02/04/2020 01/27/20 04/26/20  [place] Eric J, DO     Vitals:   02/05/20 0440 02/05/20 0500 02/05/20 0751 02/05/20 1215  BP:  136/86 (!) 143/88 (!) 143/88  Pulse:  77 73 70  Resp:  17 17   Temp: 98.6 F (37 C) 99.1 F (37.3 C) 98.2 F (36.8 C) 98.4 F (36.9 C)  TempSrc: Oral Oral Oral Oral  SpO2:  96% 97% 96%  Weight:      Height:       Exam Gen alert, a little sleepy, no distress No rash, cyanosis or gangrene Sclera anicteric, throat clear  No jvd or bruits, flat neck veins Chest clear bilat to bases, no rales/ rhonchi/ bronch bs RRR no MRG Abd soft ntnd no mass or ascites +bs GU normal male MS no joint effusions or deformity Ext no LE or hip edema, no wounds or ulcers Neuro is alert, Ox 3 , nf R IJ TDC intact, clean exit    Home meds:  - norvasc 10/ lipitor 40/ metoprolol 12.5 bid  - flomax 0.4/ norco qid prn  - prn's/ vitamins/ supplements     Na 137 BUN 38 Cr 9.57   CO2 22    CXR - bilat small infiltrates    OP HD: TTS [facility]  4h 75mn  88kg just raised  LUA AVF/ TDC  P4  2/2 bath Hep none (^IDWG ~5kg)  - no ESA, VDRA or Fe   Assessment/ Plan: 1. Gen weakness - multifact w/ COVID+ infection/ pna, also anemia w/ Hb 6.2.   Will be getting prbc's and COVID meds.   2. Anemia ckd/ malignancy - sp 2u prbc this am. Get OP records for any esa Rx 3. ESRD - recent onset, due to myeloma, on HD < 1 mo. HD today on schedule.  4. BP/volume - cont BP meds, UF 2-2.5 L on HD today. Get HD records.  5. 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 Multiple myeloma - seen by [facility], see their note 7. BPH - on flomax    Rob Schertz  MD 02/05/2020, 12:45 PM  Recent Labs  Lab 02/04/20 1930  WBC 5.5  HGB 6.2*   Recent Labs  Lab 02/04/20 1930  K 4.6  BUN 38*  CREATININE 9.57*  CALCIUM 8.7*

## 2020-02-05 NOTE — Progress Notes (Signed)
PROGRESS NOTE                                                                                                                                                                                                             Patient Demographics:    [name] [name], is a 74 y.o. male, DOB - May 25, 1946, [id number]  Admit date - 02/04/2020   Admitting Physician Shela Leff, MD  Outpatient Primary MD for the patient is Nolene Ebbs, MD  LOS - 0   No chief complaint on file.      Brief Narrative    [name] [name] is a 74 y.o. male with medical history significant of multiple myeloma, renal mass, anemia, paroxysmal A. fib, CAD, ESRD recently started on HD, chronic diastolic CHF, COPD, diet-controlled type 2 diabetes, hypertension, hyperlipidemia, BPH presented to the ED via EMS for evaluation of generalized weakness.  Patient states he has been feeling weak for the past 3 weeks. He went to see his oncologist and was told treatment could not be started for his multiple myeloma as his hemoglobin was too low.  He came into the hospital to get blood transfusions.  Denies hematemesis, hematochezia, or melena.  Denies dizziness/lightheadedness, chest pain, shortness of breath, cough, fevers, chills, nausea, vomiting, abdominal pain, or diarrhea.  Hemoglobin 6.2, previously in the 7-8 range.  FOBT negative.  Potassium 4.6, bicarb 28.  UA pending.  Screening SARS-CoV-2 PCR test positive.    Subjective:    [name] [name] today reports he is feeling better, generalized weakness and fatigue has improved, he denies any dyspnea or cough .   Assessment  & Plan :    Principal Problem:   Symptomatic anemia Active Problems:   Essential (primary) hypertension   BPH (benign prostatic hyperplasia)   Diabetes (HCC)   ESRD (end stage renal disease) (HCC)    Acute on chronic symptomatic anemia/anemia of chronic kidney disease. -No evidence or signs of GI bleed, anemia in the  setting of advanced kidney disease, ESRD patient, for myeloma .IV iron and Procrit per nephrology. -Transfused 2 unit PRBC, follow on repeat hemoglobin -Patient is not endorsing any symptoms of GI bleed.  FOBT negative.  Hemoglobin 6.2, previously in the 7-8 range.  Hemodynamically stable.  COVID-19 pneumonia: -Chest x-ray is concerning for multifocal pneumonia due to COVID-19 infection, and the patient with  no hypoxia, but he is significantly immunocompromise, with multiple myeloma, renal malignancy and immunocompromise, so we will go ahead and continue with IV Decadron and remdesivir. -follow Inflammatory markers -Patient did not receive COVID  vaccine. -Patient had previous episodes of COVID-19 infection in December 2020.  ESRD on HD TTS: -Consulted, to continue hemodialysis on TTS schedule.  Multiple myeloma:  - Seen by oncology on 5/24 and due to his poor performance status and noncompliance he is felt to be a poor candidate for systemic myeloma treatment but if he is agreeable/tolerant with ongoing HD and has a clear plan to address his renal mass, oncology would consider restarting systemic myeloma treatment as outpatient.  History of left renal mass:  -Highly suspicious for renal cell carcinoma and patient has declined therapy in the past. -Discussed these findings with patient, he is aware of it, and reports he will follow with urology as an outpatient.  Diet-controlled type 2 diabetes -Sliding scale insulin very sensitive ACHS and CBG checks.  Hypertension: Stable. -Continue home amlodipine and metoprolol.  Hyperlipidemia -Continue home Lipitor  BPH -Continue Flomax   COVID-19 Labs  No results for input(s): DDIMER, FERRITIN, LDH, CRP in the last 72 hours.  Lab Results  Component Value Date   SARSCOV2NAA POSITIVE (A) 02/04/2020   [place] NEGATIVE 01/22/2020   SARSCOV2NAA DETECTED (A) 08/23/2019   [place] NEGATIVE 06/26/2019     Code Status :  Full  Family Communication  : D/W patient  Disposition Plan  :  Status is: inpatient  The patient will require care spanning > 2 midnights and should be moved to inpatient because: IV treatments appropriate due to intensity of illness or inability to take PO  Dispo: The patient is from: Home              Anticipated d/c is to: Home              Anticipated d/c date is: > 3 days              Patient currently is not medically stable to d/c.  Will need to finish IV remdesivir and steroids  for COVID-19 pneumonia     Consults  :  renal  Procedures  : None  DVT Prophylaxis  : [place] heparin  Lab Results  Component Value Date   PLT 170 02/04/2020    Antibiotics  :    Anti-infectives (From admission, onward)   Start     Dose/Rate Route Frequency Ordered Stop   02/06/20 1000  remdesivir 100 mg in sodium chloride 0.9 % 100 mL IVPB     100 mg 200 mL/hr over 30 Minutes Intravenous Daily 02/05/20 0511 02/10/20 0959   02/05/20 0600  remdesivir 200 mg in sodium chloride 0.9% 250 mL IVPB     200 mg 580 mL/hr over 30 Minutes Intravenous Once 02/05/20 0511 02/05/20 1057        Objective:   Vitals:   02/05/20 0430 02/05/20 0440 02/05/20 0500 02/05/20 0751  BP: (!) 163/89  136/86 (!) 143/88  Pulse: 72  77 73  Resp: 20  17 17   Temp:  98.6 F (37 C) 99.1 F (37.3 C) 98.2 F (36.8 C)  TempSrc:  Oral Oral Oral  SpO2: 96%  96% 97%  Weight:      Height:        Wt Readings from Last 3 Encounters:  02/04/20 88 kg  01/27/20 88.8 kg  01/12/20 90.3 kg     Intake/Output Summary (Last  24 hours) at 02/05/2020 1137 Last data filed at 02/05/2020 0900 Gross per 24 hour  Intake 1328 ml  Output 525 ml  Net 803 ml     Physical Exam  Awake Alert, Oriented X 3, No new F.N deficits, Normal affect Symmetrical Chest wall movement, Good air movement bilaterally, CTAB RRR,No Gallops,Rubs or new Murmurs, No Parasternal Heave +ve B.Sounds, Abd Soft, No tenderness, No rebound - guarding or  rigidity. No Cyanosis, Clubbing or edema, No new Rash or bruise      Data Review:    CBC Recent Labs  Lab 02/04/20 1930  WBC 5.5  HGB 6.2*  HCT 19.9*  PLT 170  MCV 101.5*  MCH 31.6  MCHC 31.2  RDW 15.0  LYMPHSABS 1.0  MONOABS 0.7  EOSABS 0.1  BASOSABS 0.0    Chemistries  Recent Labs  Lab 02/04/20 1930  NA 137  K 4.6  CL 97*  CO2 28  GLUCOSE 81  BUN 38*  CREATININE 9.57*  CALCIUM 8.7*   ------------------------------------------------------------------------------------------------------------------ No results for input(s): CHOL, HDL, LDLCALC, TRIG, CHOLHDL, LDLDIRECT in the last 72 hours.  Lab Results  Component Value Date   HGBA1C 5.2 06/26/2019   ------------------------------------------------------------------------------------------------------------------ No results for input(s): TSH, T4TOTAL, T3FREE, THYROIDAB in the last 72 hours.  Invalid input(s): FREET3 ------------------------------------------------------------------------------------------------------------------ No results for input(s): VITAMINB12, FOLATE, FERRITIN, TIBC, IRON, RETICCTPCT in the last 72 hours.  Coagulation profile No results for input(s): INR, PROTIME in the last 168 hours.  No results for input(s): DDIMER in the last 72 hours.  Cardiac Enzymes No results for input(s): CKMB, TROPONINI, MYOGLOBIN in the last 168 hours.  Invalid input(s): CK ------------------------------------------------------------------------------------------------------------------    Component Value Date/Time   BNP 36.2 09/09/2018 1834    Inpatient Medications  Scheduled Meds: . amLODipine  10 mg Oral Daily  . atorvastatin  40 mg Oral Daily  . Chlorhexidine Gluconate Cloth  6 each Topical Q0600  . dexamethasone (DECADRON) injection  6 mg Intravenous Q24H  . heparin  5,000 Units Subcutaneous Q8H  . insulin aspart  0-5 Units Subcutaneous QHS  . insulin aspart  0-6 Units Subcutaneous TID WC    . metoprolol tartrate  12.5 mg Oral BID  . multivitamin  1 tablet Oral QHS  . pantoprazole  40 mg Oral Daily  . tamsulosin  0.4 mg Oral Daily   Continuous Infusions: . [START ON 02/06/2020] remdesivir 100 mg in NS 100 mL     PRN Meds:.acetaminophen **OR** acetaminophen  Micro Results Recent Results (from the past 240 hour(s))  SARS Coronavirus 2 by RT PCR (hospital order, performed in [facility] hospital lab) Nasopharyngeal Nasopharyngeal Swab     Status: Abnormal   Collection Time: 02/04/20  8:35 PM   Specimen: Nasopharyngeal Swab  Result Value Ref Range Status   SARS Coronavirus 2 POSITIVE (A) NEGATIVE Final    Comment: RESULT CALLED TO, READ BACK BY AND VERIFIED WITH: A CAIN RN 2155 02/04/20 A BROWNING (NOTE) SARS-CoV-2 target nucleic acids are DETECTED SARS-CoV-2 RNA is generally detectable in upper respiratory specimens  during the acute phase of infection.  Positive results are indicative  of the presence of the identified virus, but do not rule out bacterial infection or co-infection with other pathogens not detected by the test.  Clinical correlation with patient history and  other diagnostic information is necessary to determine patient infection status.  The expected result is negative. Fact Sheet for Patients:   StrictlyIdeas.no  Fact Sheet for Healthcare Providers:   BankingDealers.co.za  This test is not yet approved or cleared by the [place] and  has been authorized for detection and/or diagnosis of SARS-CoV-2 by FDA under an Emergency Use Authorization (EUA).  This EUA will remain in effect (meaning this test can be  used) for the duration of  the COVID-19 declaration under Section 564(b)(1) of the Act, 21 U.S.C. section 360-bbb-3(b)(1), unless the authorization is terminated or revoked sooner. Performed at [facility], [place] [address], [place],  [address]     Radiology Reports IR Fluoro  Guide CV Line Right  Result Date: 01/24/2020 INDICATION: 74 year old male referred for tunneled hemodialysis catheter EXAM: TUNNELED CENTRAL VENOUS HEMODIALYSIS CATHETER PLACEMENT WITH ULTRASOUND AND FLUOROSCOPIC GUIDANCE MEDICATIONS: 1 g vancomycin. The antibiotic was given in an appropriate time interval prior to skin puncture. ANESTHESIA/SEDATION: Moderate (conscious) sedation was employed during this procedure. A total of Versed 1.0 mg and Fentanyl 25 mcg was administered intravenously. Moderate Sedation Time: 14 minutes. The patient's level of consciousness and vital signs were monitored continuously by radiology nursing throughout the procedure under my direct supervision. FLUOROSCOPY TIME:  Fluoroscopy Time: 0 minutes 48 seconds (4 mGy). COMPLICATIONS: None PROCEDURE: Informed written consent was obtained from the patient after a discussion of the risks, benefits, and alternatives to treatment. Questions regarding the procedure were encouraged and answered. The right neck and chest were prepped with chlorhexidine in a sterile fashion, and a sterile drape was applied covering the operative field. Maximum barrier sterile technique with sterile gowns and gloves were used for the procedure. A timeout was performed prior to the initiation of the procedure. Ultrasound survey was performed. Micropuncture kit was utilized to access the right internal jugular vein under direct, real-time ultrasound guidance after the overlying soft tissues were anesthetized with 1% lidocaine with epinephrine. Stab incision was made with 11 blade scalpel. Microwire was passed centrally. The microwire was then marked to measure appropriate internal catheter length. External tunneled length was estimated. A total tip to cuff length of 23 cm was selected. 035 guidewire was advanced to the level of the IVC. Skin and subcutaneous tissues of chest wall below the clavicle were generously infiltrated with 1% lidocaine for local anesthesia. A  small stab incision was made with 11 blade scalpel. The selected hemodialysis catheter was tunneled in a retrograde fashion from the anterior chest wall to the venotomy incision. Serial dilation was performed and then a peel-away sheath was placed. The catheter was then placed through the peel-away sheath with tips ultimately positioned within the superior aspect of the right atrium. Final catheter positioning was confirmed and documented with a spot radiographic image. The catheter aspirates and flushes normally. The catheter was flushed with appropriate volume heparin dwells. The catheter exit site was secured with a 0-Prolene retention suture. Gel-Foam slurry was infused into the soft tissue tract. The venotomy incision was closed Derma bond and sterile dressing. Dressings were applied at the chest wall. Patient tolerated the procedure well and remained hemodynamically stable throughout. No complications were encountered and no significant blood loss encountered. IMPRESSION: Status post right IJ tunneled hemodialysis catheter. Signed, Dulcy Fanny. Dellia Nims, RPVI [facility] Radiology Electronically Signed   By: Corrie Mckusick D.O.   On: 01/24/2020 17:27   IR US Guide Vasc Access Right  Result Date: 01/24/2020 INDICATION: 74 year old male referred for tunneled hemodialysis catheter EXAM: TUNNELED CENTRAL VENOUS HEMODIALYSIS CATHETER PLACEMENT WITH ULTRASOUND AND FLUOROSCOPIC GUIDANCE MEDICATIONS: 1 g vancomycin. The antibiotic was given in an appropriate  time interval prior to skin puncture. ANESTHESIA/SEDATION: Moderate (conscious) sedation was employed during this procedure. A total of Versed 1.0 mg and Fentanyl 25 mcg was administered intravenously. Moderate Sedation Time: 14 minutes. The patient's level of consciousness and vital signs were monitored continuously by radiology nursing throughout the procedure under my direct supervision. FLUOROSCOPY TIME:   Fluoroscopy Time: 0 minutes 48 seconds (4 mGy). COMPLICATIONS: None PROCEDURE: Informed written consent was obtained from the patient after a discussion of the risks, benefits, and alternatives to treatment. Questions regarding the procedure were encouraged and answered. The right neck and chest were prepped with chlorhexidine in a sterile fashion, and a sterile drape was applied covering the operative field. Maximum barrier sterile technique with sterile gowns and gloves were used for the procedure. A timeout was performed prior to the initiation of the procedure. Ultrasound survey was performed. Micropuncture kit was utilized to access the right internal jugular vein under direct, real-time ultrasound guidance after the overlying soft tissues were anesthetized with 1% lidocaine with epinephrine. Stab incision was made with 11 blade scalpel. Microwire was passed centrally. The microwire was then marked to measure appropriate internal catheter length. External tunneled length was estimated. A total tip to cuff length of 23 cm was selected. 035 guidewire was advanced to the level of the IVC. Skin and subcutaneous tissues of chest wall below the clavicle were generously infiltrated with 1% lidocaine for local anesthesia. A small stab incision was made with 11 blade scalpel. The selected hemodialysis catheter was tunneled in a retrograde fashion from the anterior chest wall to the venotomy incision. Serial dilation was performed and then a peel-away sheath was placed. The catheter was then placed through the peel-away sheath with tips ultimately positioned within the superior aspect of the right atrium. Final catheter positioning was confirmed and documented with a spot radiographic image. The catheter aspirates and flushes normally. The catheter was flushed with appropriate volume heparin dwells. The catheter exit site was secured with a 0-Prolene retention suture. Gel-Foam slurry was infused into the soft tissue  tract. The venotomy incision was closed Derma bond and sterile dressing. Dressings were applied at the chest wall. Patient tolerated the procedure well and remained hemodynamically stable throughout. No complications were encountered and no significant blood loss encountered. IMPRESSION: Status post right IJ tunneled hemodialysis catheter. Signed, Dulcy Fanny. Dellia Nims, RPVI [facility] Radiology Electronically Signed   By: Corrie Mckusick D.O.   On: 01/24/2020 17:27   DG CHEST PORT 1 VIEW  Result Date: 02/05/2020 CLINICAL DATA:  COVID-19, new onset dialysis EXAM: PORTABLE CHEST 1 VIEW COMPARISON:  01/22/2020 FINDINGS: Mild subpleural opacities in the right upper lobe and left upper lobe/lingula. Mild retrocardiac opacity. These findings favor multifocal pneumonia in this patient with known COVID. No pleural effusion or pneumothorax. Cardiomegaly. Right IJ dual lumen dialysis catheter terminating in the upper right atrium. IMPRESSION: Multifocal pneumonia in this patient with known COVID. Electronically Signed   By: Julian Hy M.D.   On: 02/05/2020 00:57   DG CHEST PORT 1 VIEW  Result Date: 01/22/2020 CLINICAL DATA:  Shortness of breath EXAM: PORTABLE CHEST 1 VIEW COMPARISON:  11/30/2018 FINDINGS: Cardiomegaly and aortic tortuosity. There is no edema, consolidation, effusion, or pneumothorax. Hazy density at the right apex is chronic and attributed to ectatic vessels based on 2018 chest CT. IMPRESSION: Cardiomegaly without failure. Electronically Signed   By: Monte Fantasia M.D.   On: 01/22/2020 07:06   CT RENAL STONE STUDY  Result Date: 01/22/2020 CLINICAL DATA:  74 year old male with a history of flank pain EXAM: CT ABDOMEN AND PELVIS WITHOUT CONTRAST TECHNIQUE: Multidetector CT imaging of the abdomen and pelvis was performed following the standard protocol without IV contrast. COMPARISON:  Ultrasound 06/26/2019, CT 06/01/2019 FINDINGS: Lower chest:  No acute finding of the lower chest. Hepatobiliary: Similar appearance of liver with small right-sided hypodense focus measuring 11 mm and additional small subcentimeter hypodense foci within the right liver. Gallbladder decompressed. Pancreas: Unremarkable Spleen: Unremarkable Adrenals/Urinary Tract: - Right adrenal gland:  Unremarkable - Left adrenal gland: Unremarkable. - Right kidney: Similar appearance of the right kidney with multiple rounded lesions some of which are hyperdense and some of which are low-density. Complex lesion at the inferior renal cortex on the right estimated 4.7 cm x 4.5 cm, similar to the comparison. No evidence of hydronephrosis or nephrolithiasis. Unremarkable course of the right ureter. - Left Kidney: No evidence of left-sided hydronephrosis. Unremarkable course of the left ureter. Redemonstration of multiple low-density and high density lesions of left kidney. 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 The axial dimensions on the prior CT measured 7.2 cm x 6.2 cm while the axial dimensions on the current CT are estimated 7.5 cm x 8.7 cm - Urinary Bladder: Urinary bladder is relatively unremarkable partially distended. Impression on the bladder base secondary to the median lobe of the prostate. Stomach/Bowel: - Stomach: Unremarkable. - Small bowel: Unremarkable - Appendix: Appendix is not visualized, however, no inflammatory changes are present adjacent to the cecum to indicate an appendicitis. - Colon: Surgical changes in the region of the cecum. Redemonstration of ventral hernia containing segment of transverse colon. No evidence of obstruction. Colonic diverticula. No inflammatory changes. No significant stool burden. Vascular/Lymphatic: Atherosclerotic calcifications of the abdominal aorta. Similar diameter of the infrarenal abdominal aorta with  similar course caliber and contour. Greatest diameter measures 28 mm, similar to the comparison. Atherosclerotic changes of the bilateral iliac arteries. No lymphadenopathy. Reproductive: Enlarged prostate with the diameter on axial images measuring 6.7 cm x 8.2 cm. Impression on the bladder base from the median lobe. Other: Small umbilical hernia which again appears to have a knuckle of small bowel entering the hernia sac. No evidence of associated obstruction. Supraumbilical ventral wall hernia again noted containing short segment of transverse colon. No significant inflammatory changes or evidence of obstruction. Bilateral fat containing inguinal hernia with trace fluid on the left. Musculoskeletal: No acute displaced fracture. Multilevel degenerative changes of the lumbar spine with multiple Schmorl's nodes. Vacuum disc phenomenon present at all levels except for L4-L5. No bony canal narrowing with posterior disc bulges at all levels. Degenerative changes of the bilateral hips. IMPRESSION: CT is negative for nephrolithiasis or hydronephrosis. No acute CT finding identified that would account for flank pain. 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 Urology referral is indicated if not previously completed. Redemonstration of additional bilateral renal lesions, incompletely characterized on the current CT. This includes a complex lesion at the inferior right kidney which may represent a partially cystic additional renal cell carcinoma. Again, urology referral is indicated. Unchanged appearance of a supraumbilical ventral hernia containing transverse colon. Unchanged appearance of umbilical hernia which contains short segment of small bowel, without obstruction. Prostatomegaly. Aortic Atherosclerosis (ICD10-I70.0). Unchanged  diameter of the infrarenal abdominal aorta measuring 2.8  cm. Ectatic abdominal aorta at risk for aneurysm development. Recommend followup by ultrasound in 5 years. This recommendation follows ACR consensus guidelines: White Paper of the ACR Incidental Findings Committee II on Vascular Findings. J Am Coll Radiol 2013; 10:789-794. Additional ancillary findings as above. Electronically Signed   By: Corrie Mckusick D.O.   On: 01/22/2020 12:38     Phillips Climes M.D on 02/05/2020 at 11:37 AM    [facility] -  Office  [phone]

## 2020-02-05 NOTE — Progress Notes (Signed)
PLUMMER MATICH [id number] Admitted 02/05/2020 12:09 AM Attending Provider: Shela Leff, MD    [name] [name] is a 74 y.o. male patient admitted from ED awake, alert  & orientated  X 3,  Full Code, VSS - Blood pressure (!) 147/97, pulse 88, temperature 98.3 F (36.8 C), temperature source Oral, resp. rate 18, height 5\' 9"  (1.753 m), weight 88 kg, SpO2 97 %., no c/o shortness of breath, no c/o chest pain, no distress noted. Tele placed and pt is currently running:SR   IV site WDL:  with a transparent dsg that's clean dry and intact.  Allergies:   Allergies  Allergen Reactions  . Amoxicillin Other (See Comments)    Tolerates Unasyn. Can't move Has patient had a PCN reaction causing immediate rash, facial/tongue/throat swelling, SOB or lightheadedness with hypotension: No Has patient had a PCN reaction causing severe rash involving mucus membranes or skin necrosis: No Has patient had a PCN reaction that required hospitalization No Has patient had a PCN reaction occurring within the last 10 years: No If all of the above answers are "NO", then may proceed with Cephalosporin use.   [name] Penicillins Other (See Comments)    Tolerates Unasyn. Can't move/dizziness Has patient had a PCN reaction causing immediate rash, facial/tongue/throat swelling, SOB or lightheadedness with hypotension: No Has patient had a PCN reaction causing severe rash involving mucus membranes or skin necrosis: No Has patient had a PCN reaction that required hospitalization No Has patient had a PCN reaction occurring within the last 10 years: No If all of the above answers are "NO", then may proceed with Cephalosporin use.       Past Medical History:  Diagnosis Date  . Abnormal CT of liver    a. nodular contour suggesting cirrhosis 05/2018.  [name] Abnormal LFTs (liver function tests)   . Anxiety   . Aortic root dilatation ([place])   . Ascending aortic aneurysm ([place])   . BPH with urinary obstruction   . Bradycardia   .  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 CAD (coronary artery disease)    a. 12/2015 NSTEMI/Cath (in setting of PAF):  LM nl, LAD 30p,  LCX 50m, RCA  ok, AM 100%, RPDA1 40, RPDA 2 60 ->Med Rx.  . Childhood asthma   . Chronic diastolic CHF (congestive heart failure) ([place])    a. 12/2015 Echo: EF 50-55%, mod AI, mod Ao root dil, mild MR, mod dil LA, mod RA.  . CKD (chronic kidney disease), stage III   . Colon cancer ([place])   . COPD (chronic obstructive pulmonary disease) ([place])    pt denies at preop  . Diabetes mellitus type II    diet controlled  . History of syncope   . Hyperlipidemia   . Hypertensive heart disease   . Kidney lump 04/04/2010   Overview:  Renal Cell Carcinoma   . 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 Moderate aortic insufficiency    a. 12/2015 Echo: Mod AI.  [name] Paroxysmal atrial fibrillation ([place])    a. 12/2015 started on Xarelto (CHA2DS2VASc = 4-5).  . Pneumonia   . Prostate cancer ([place])   . Renal cell carcinoma ([place])   . Sleep apnea    Does not like  CPAP  . Spinal stenosis in cervical region 10/12/2016  . Venous insufficiency     History:  obtained from patient  Pt orientation to unit, room and routine. Information  packet given to patient.  Admission INP armband ID verified with patient, and in place. SR up x 2, fall risk assessment complete with Patient verbalizing understanding of risks associated with falls. Pt verbalizes an understanding of how to use the call bell and to call for help before getting out of bed.  Skin, clean-dry- intact without evidence of bruising, or skin tears.   No evidence of skin break down noted on exam.  Will cont to monitor and assist as needed.  Parthenia Ames, RN 02/05/2020 12:09 AM

## 2020-02-05 NOTE — Progress Notes (Signed)
Mancel Parsons, RN called and notified that the pt's HD tx has been moved to 02/06/20.

## 2020-02-05 NOTE — Progress Notes (Signed)
Initial Nutrition Assessment  DOCUMENTATION CODES:   Not applicable  INTERVENTION:   Nepro Shake po BID, each supplement provides 425 kcal and 19 grams protein  Rena-vite po daily  Dysphagia 3 diet   NUTRITION DIAGNOSIS:   Increased nutrient needs related to chronic illness(ESRD on HD, COVID 19, COPD, multiple myeloma) as evidenced by increased estimated needs.  GOAL:   Patient will meet greater than or equal to 90% of their needs  MONITOR:   PO intake, Supplement acceptance, Labs, Weight trends, Skin, I & O's  REASON FOR ASSESSMENT:   Malnutrition Screening Tool    ASSESSMENT:   73 y.o. male with medical history significant of multiple myeloma, renal mass, anemia, paroxysmal A. fib, CAD, ESRD recently started on HD, chronic diastolic CHF, COPD, diet-controlled type 2 diabetes, hypertension, hyperlipidemia, BPH presented to the ED via EMS for evaluation of generalized weakness and found to have COVID 19 PNA  RD working remotely.  Spoke with pt via phone. Pt reports poor appetite and oral intake for several weeks pta but reports that his appetite is improving in hospital. Pt reports eating 90-100% of his meals in hospital. Pt reports that he is willing to drink supplements while in hospital as he reports he was drinking these during his last admit. RD will add supplements and rena-vite to help pt meet his estimated needs and replace losses from HD. Pt also reports that he only has a few teeth and that he is having trouble eating some of the food. RD will order mechanical soft diet. Per chart, pt is down 19lbs(9%) over the past 5 months.  Medications reviewed and include: dexamethasone, heparin, insulin, rena-vite, protonix  Labs reviewed: BUN 38(H), creat 9.57(H) Hgb 6.2(L), Hct 19.9(L)  NUTRITION - FOCUSED PHYSICAL EXAM: Unable to complete at this time  Diet Order:   Diet Order            DIET DYS 3 Room service appropriate? Yes; Fluid consistency: Thin; Fluid  restriction: 1500 mL Fluid  Diet effective now             EDUCATION NEEDS:   Education needs have been addressed  Skin:  Skin Assessment: Reviewed RN Assessment  Last BM:  PTA  Height:   Ht Readings from Last 1 Encounters:  02/04/20 5' 9" (1.753 m)    Weight:   Wt Readings from Last 1 Encounters:  02/04/20 88 kg    Ideal Body Weight:  72.7 kg  BMI:  Body mass index is 28.65 kg/m.  Estimated Nutritional Needs:   Kcal:  2100-2400kcal/day  Protein:  105-120g/day  Fluid:  UOP +1L  Casey Campbell MS, RD, LDN Please refer to [facility] for RD and/or RD on-call/weekend/after hours pager  

## 2020-02-06 LAB — PREPARE RBC (CROSSMATCH)

## 2020-02-06 LAB — CBC
HCT: 24.1 % — ABNORMAL LOW (ref 39.0–52.0)
Hemoglobin: 7.9 g/dL — ABNORMAL LOW (ref 13.0–17.0)
MCH: 31.3 pg (ref 26.0–34.0)
MCHC: 32.8 g/dL (ref 30.0–36.0)
MCV: 95.6 fL (ref 80.0–100.0)
Platelets: 191 10*3/uL (ref 150–400)
RBC: 2.52 MIL/uL — ABNORMAL LOW (ref 4.22–5.81)
RDW: 15.2 % (ref 11.5–15.5)
WBC: 7.3 10*3/uL (ref 4.0–10.5)
nRBC: 0 % (ref 0.0–0.2)

## 2020-02-06 LAB — GLUCOSE, CAPILLARY
Glucose-Capillary: 263 mg/dL — ABNORMAL HIGH (ref 70–99)
Glucose-Capillary: 100 mg/dL — ABNORMAL HIGH (ref 70–99)
Glucose-Capillary: 187 mg/dL — ABNORMAL HIGH (ref 70–99)

## 2020-02-06 LAB — C-REACTIVE PROTEIN: CRP: 2.2 mg/dL — ABNORMAL HIGH (ref <1.0)

## 2020-02-06 MED ORDER — HEPARIN SODIUM (PORCINE) 1000 UNIT/ML IJ SOLN
INTRAMUSCULAR | Status: AC
  Filled 2020-02-06: qty 10

## 2020-02-06 MED ORDER — MELATONIN 5 MG PO TABS
5.0000 mg | ORAL_TABLET | Freq: Every evening | ORAL | Status: DC | PRN
  Administered 2020-02-06 – 2020-02-08 (×2): 5 mg via ORAL
  Filled 2020-02-06 (×3): qty 1

## 2020-02-06 MED ORDER — SODIUM CHLORIDE 0.9% IV SOLUTION: Freq: Once | INTRAVENOUS | Status: AC

## 2020-02-06 NOTE — Progress Notes (Signed)
Pt refusing all daily meds related to blood pressure, insulin, and protonix d/t future dialysis session. Patient understands dialysis will not take place until the evening. Dr. Waldron Labs aware.

## 2020-02-06 NOTE — Progress Notes (Addendum)
PROGRESS NOTE                                                                                                                                                                                                             Patient Demographics:    [name] [name], is a 74 y.o. male, DOB - 12-02-1945, [id number]  Admit date - 02/04/2020   Admitting Physician Shela Leff, MD  Outpatient Primary MD for the patient is Nolene Ebbs, MD  LOS - 1   No chief complaint on file.      Brief Narrative    [name] [name] is a 74 y.o. male with medical history significant of multiple myeloma, renal mass, anemia, paroxysmal A. fib, CAD, ESRD recently started on HD, chronic diastolic CHF, COPD, diet-controlled type 2 diabetes, hypertension, hyperlipidemia, BPH presented to the ED via EMS for evaluation of generalized weakness.  Patient states he has been feeling weak for the past 3 weeks. He went to see his oncologist and was told treatment could not be started for his multiple myeloma as his hemoglobin was too low.  He came into the hospital to get blood transfusions.  Denies hematemesis, hematochezia, or melena.  Denies dizziness/lightheadedness, chest pain, shortness of breath, cough, fevers, chills, nausea, vomiting, abdominal pain, or diarrhea.  Hemoglobin 6.2, previously in the 7-8 range.  FOBT negative.  Potassium 4.6, bicarb 28.  UA pending.  Screening SARS-CoV-2 PCR test positive.    Subjective:    [name] [name] today still reports generalized weakness, but feeling better, reports mild cough, but no dyspnea .   Assessment  & Plan :    Principal Problem:   Symptomatic anemia Active Problems:   Essential (primary) hypertension   BPH (benign prostatic hyperplasia)   Diabetes (HCC)   ESRD (end stage renal disease) (HCC)    Acute on chronic symptomatic anemia/anemia of chronic kidney disease. -No evidence or signs of GI bleed, anemia in the setting of advanced  kidney disease, ESRD patient, for myeloma .IV iron and Procrit per nephrology. -Transfused 2 unit PRBC, hemoglobin today 7.9, will go ahead and transfuse another unit as his hemoglobin need to be 8 so he can be started on his epi for multiple myeloma by his oncologist. -Patient is not endorsing any symptoms of GI bleed.  FOBT negative.  Hemoglobin 6.2, previously in the  7-8 range.  Hemodynamically stable.  COVID-19 pneumonia: -Chest x-ray is concerning for multifocal pneumonia due to COVID-19 infection, and the patient with no hypoxia, but he is significantly immunocompromise, with multiple myeloma, renal malignancy and immunocompromise, so we will go ahead and continue with IV Decadron and remdesivir. -follow Inflammatory markers -Patient did not receive COVID  vaccine. -Patient had previous episodes of COVID-19 infection in December 2020. COVID-19 Labs  Recent Labs    02/05/20 1218 02/06/20 0201  DDIMER 4.50*  --   FERRITIN 894*  --   CRP 2.6* 2.2*    Lab Results  Component Value Date   SARSCOV2NAA POSITIVE (A) 02/04/2020   [place] NEGATIVE 01/22/2020   SARSCOV2NAA DETECTED (A) 08/23/2019   [place] NEGATIVE 06/26/2019     ESRD on HD TTS: -Renal consulted.  Multiple myeloma:  - Seen by oncology on 5/24 and due to his poor performance status and noncompliance he is felt to be a poor candidate for systemic myeloma treatment but if he is agreeable/tolerant with ongoing HD and has a clear plan to address his renal mass, oncology would consider restarting systemic myeloma treatment as outpatient.  History of left renal mass:  -Highly suspicious for renal cell carcinoma and patient has declined therapy in the past. -Discussed these findings with patient, he is aware of it, and reports he will follow with urology as an outpatient.  Diet-controlled type 2 diabetes -Sliding scale insulin very sensitive ACHS and CBG checks.  Hypertension: Stable. -Continue home  amlodipine and metoprolol.  Hyperlipidemia -Continue home Lipitor  BPH -Continue Flomax   COVID-19 Labs  Recent Labs    02/05/20 1218 02/06/20 0201  DDIMER 4.50*  --   FERRITIN 894*  --   CRP 2.6* 2.2*    Lab Results  Component Value Date   SARSCOV2NAA POSITIVE (A) 02/04/2020   [place] NEGATIVE 01/22/2020   SARSCOV2NAA DETECTED (A) 08/23/2019   [place] NEGATIVE 06/26/2019     Code Status : Full  Family Communication  : D/W son [name] via phone.  Disposition Plan  :  Status is: inpatient  The patient will require care spanning > 2 midnights and should be moved to inpatient because: IV treatments appropriate due to intensity of illness or inability to take PO  Dispo: The patient is from: Home              Anticipated d/c is to: Home              Anticipated d/c date is: > 3 days              Patient currently is not medically stable to d/c.  Will need to finish IV remdesivir and steroids  for COVID-19 pneumonia     Consults  :  renal  Procedures  : None  DVT Prophylaxis  : [place] heparin  Lab Results  Component Value Date   PLT 191 02/06/2020    Antibiotics  :    Anti-infectives (From admission, onward)   Start     Dose/Rate Route Frequency Ordered Stop   02/06/20 1000  remdesivir 100 mg in sodium chloride 0.9 % 100 mL IVPB     100 mg 200 mL/hr over 30 Minutes Intravenous Daily 02/05/20 0511 02/10/20 0959   02/05/20 0600  remdesivir 200 mg in sodium chloride 0.9% 250 mL IVPB     200 mg 580 mL/hr over 30 Minutes Intravenous Once 02/05/20 0511 02/05/20 1057        Objective:   Vitals:  02/05/20 1929 02/05/20 2009 02/05/20 2025 02/06/20 0405  BP: (!) 149/92   (!) 155/99  Pulse: 66   63  Resp: 15   19  Temp: 98.3 F (36.8 C)   97.9 F (36.6 C)  TempSrc: Oral   Oral  SpO2: 97%   94%  Weight:  93.5 kg 92.8 kg   Height:        Wt Readings from Last 3 Encounters:  02/05/20 92.8 kg  01/27/20 88.8 kg  01/12/20 90.3 kg      Intake/Output Summary (Last 24 hours) at 02/06/2020 1044 Last data filed at 02/06/2020 0535 Gross per 24 hour  Intake 780 ml  Output 400 ml  Net 380 ml     Physical Exam  Awake Alert, Oriented X 3, No new F.N deficits, Normal affect Symmetrical Chest wall movement, Good air movement bilaterally, CTAB RRR,No Gallops,Rubs or new Murmurs, No Parasternal Heave +ve B.Sounds, Abd Soft, No tenderness, No rebound - guarding or rigidity. No Cyanosis, Clubbing or edema, No new Rash or bruise        Data Review:    CBC Recent Labs  Lab 02/04/20 1930 02/05/20 1218 02/06/20 0201  WBC 5.5 6.3 7.3  HGB 6.2* 7.9* 7.9*  HCT 19.9* 23.9* 24.1*  PLT 170 195 191  MCV 101.5* 96.8 95.6  MCH 31.6 32.0 31.3  MCHC 31.2 33.1 32.8  RDW 15.0 15.7* 15.2  LYMPHSABS 1.0  --   --   MONOABS 0.7  --   --   EOSABS 0.1  --   --   BASOSABS 0.0  --   --     Chemistries  Recent Labs  Lab 02/04/20 1930 02/05/20 1218  NA 137 136  K 4.6 4.9  CL 97* 96*  CO2 28 27  GLUCOSE 81 134*  BUN 38* 46*  CREATININE 9.57* 10.45*  CALCIUM 8.7* 8.8*   ------------------------------------------------------------------------------------------------------------------ No results for input(s): CHOL, HDL, LDLCALC, TRIG, CHOLHDL, LDLDIRECT in the last 72 hours.  Lab Results  Component Value Date   HGBA1C 5.2 06/26/2019   ------------------------------------------------------------------------------------------------------------------ No results for input(s): TSH, T4TOTAL, T3FREE, THYROIDAB in the last 72 hours.  Invalid input(s): FREET3 ------------------------------------------------------------------------------------------------------------------ Recent Labs    02/05/20 1218  FERRITIN 894*    Coagulation profile No results for input(s): INR, PROTIME in the last 168 hours.  Recent Labs    02/05/20 1218  DDIMER 4.50*    Cardiac Enzymes No results for input(s): CKMB, TROPONINI, MYOGLOBIN in  the last 168 hours.  Invalid input(s): CK ------------------------------------------------------------------------------------------------------------------    Component Value Date/Time   BNP 36.2 09/09/2018 1834    Inpatient Medications  Scheduled Meds: . amLODipine  10 mg Oral Daily  . atorvastatin  40 mg Oral Daily  . Chlorhexidine Gluconate Cloth  6 each Topical Q0600  . dexamethasone (DECADRON) injection  6 mg Intravenous Q24H  . feeding supplement (NEPRO CARB STEADY)  237 mL Oral BID BM  . heparin  5,000 Units Subcutaneous Q8H  . insulin aspart  0-5 Units Subcutaneous QHS  . insulin aspart  0-6 Units Subcutaneous TID WC  . metoprolol tartrate  12.5 mg Oral BID  . multivitamin  1 tablet Oral QHS  . pantoprazole  40 mg Oral Daily  . tamsulosin  0.4 mg Oral Daily   Continuous Infusions: . remdesivir 100 mg in NS 100 mL     PRN Meds:.acetaminophen **OR** acetaminophen, ALPRAZolam, melatonin  Micro Results Recent Results (from the past 240 hour(s))  SARS Coronavirus 2 by RT  PCR (hospital order, performed in [facility] hospital lab) Nasopharyngeal Nasopharyngeal Swab     Status: Abnormal   Collection Time: 02/04/20  8:35 PM   Specimen: Nasopharyngeal Swab  Result Value Ref Range Status   SARS Coronavirus 2 POSITIVE (A) NEGATIVE Final    Comment: RESULT CALLED TO, READ BACK BY AND VERIFIED WITH: A CAIN RN 2155 02/04/20 A BROWNING (NOTE) SARS-CoV-2 target nucleic acids are DETECTED SARS-CoV-2 RNA is generally detectable in upper respiratory specimens  during the acute phase of infection.  Positive results are indicative  of the presence of the identified virus, but do not rule out bacterial infection or co-infection with other pathogens not detected by the test.  Clinical correlation with patient history and  other diagnostic information is necessary to determine patient infection status.  The expected result is negative. Fact Sheet for Patients:    StrictlyIdeas.no  Fact Sheet for Healthcare Providers:   BankingDealers.co.za   This test is not yet approved or cleared by the [place] FDA and  has been authorized for detection and/or diagnosis of SARS-CoV-2 by FDA under an Emergency Use Authorization (EUA).  This EUA will remain in effect (meaning this test can be  used) for the duration of  the COVID-19 declaration under Section 564(b)(1) of the Act, 21 U.S.C. section 360-bbb-3(b)(1), unless the authorization is terminated or revoked sooner. Performed at [facility], [place] [address], [place], [place] [address]   MRSA PCR Screening     Status: None   Collection Time: 02/05/20  8:54 PM   Specimen: Nasal Mucosa; Nasopharyngeal  Result Value Ref Range Status   MRSA by PCR NEGATIVE NEGATIVE Final    Comment:        The GeneXpert MRSA Assay (FDA approved for NASAL specimens only), is one component of a comprehensive MRSA colonization surveillance program. It is not intended to diagnose MRSA infection nor to guide or monitor treatment for MRSA infections. Performed at [facility], [place] [address], [place], [place] [address]     Radiology Reports IR Fluoro Guide CV Line Right  Result Date: 01/24/2020 INDICATION: 74 year old male referred for tunneled hemodialysis catheter EXAM: TUNNELED CENTRAL VENOUS HEMODIALYSIS CATHETER PLACEMENT WITH ULTRASOUND AND FLUOROSCOPIC GUIDANCE MEDICATIONS: 1 g vancomycin. The antibiotic was given in an appropriate time interval prior to skin puncture. ANESTHESIA/SEDATION: Moderate (conscious) sedation was employed during this procedure. A total of Versed 1.0 mg and Fentanyl 25 mcg was administered intravenously. Moderate Sedation Time: 14 minutes. The patient's level of consciousness and vital signs were monitored continuously by radiology nursing throughout the procedure under my direct supervision. FLUOROSCOPY TIME:  Fluoroscopy Time: 0  minutes 48 seconds (4 mGy). COMPLICATIONS: None PROCEDURE: Informed written consent was obtained from the patient after a discussion of the risks, benefits, and alternatives to treatment. Questions regarding the procedure were encouraged and answered. The right neck and chest were prepped with chlorhexidine in a sterile fashion, and a sterile drape was applied covering the operative field. Maximum barrier sterile technique with sterile gowns and gloves were used for the procedure. A timeout was performed prior to the initiation of the procedure. Ultrasound survey was performed. Micropuncture kit was utilized to access the right internal jugular vein under direct, real-time ultrasound guidance after the overlying soft tissues were anesthetized with 1% lidocaine with epinephrine. Stab incision was made with 11 blade scalpel. Microwire was passed centrally. The microwire was then marked to measure appropriate internal catheter length. External tunneled length was estimated. A total  tip to cuff length of 23 cm was selected. 035 guidewire was advanced to the level of the IVC. Skin and subcutaneous tissues of chest wall below the clavicle were generously infiltrated with 1% lidocaine for local anesthesia. A small stab incision was made with 11 blade scalpel. The selected hemodialysis catheter was tunneled in a retrograde fashion from the anterior chest wall to the venotomy incision. Serial dilation was performed and then a peel-away sheath was placed. The catheter was then placed through the peel-away sheath with tips ultimately positioned within the superior aspect of the right atrium. Final catheter positioning was confirmed and documented with a spot radiographic image. The catheter aspirates and flushes normally. The catheter was flushed with appropriate volume heparin dwells. The catheter exit site was secured with a 0-Prolene retention suture. Gel-Foam slurry was infused into the soft tissue tract. The venotomy  incision was closed Derma bond and sterile dressing. Dressings were applied at the chest wall. Patient tolerated the procedure well and remained hemodynamically stable throughout. No complications were encountered and no significant blood loss encountered. IMPRESSION: Status post right IJ tunneled hemodialysis catheter. Signed, Dulcy Fanny. Dellia Nims, RPVI [facility] Radiology Electronically Signed   By: Corrie Mckusick D.O.   On: 01/24/2020 17:27   IR US Guide Vasc Access Right  Result Date: 01/24/2020 INDICATION: 74 year old male referred for tunneled hemodialysis catheter EXAM: TUNNELED CENTRAL VENOUS HEMODIALYSIS CATHETER PLACEMENT WITH ULTRASOUND AND FLUOROSCOPIC GUIDANCE MEDICATIONS: 1 g vancomycin. The antibiotic was given in an appropriate time interval prior to skin puncture. ANESTHESIA/SEDATION: Moderate (conscious) sedation was employed during this procedure. A total of Versed 1.0 mg and Fentanyl 25 mcg was administered intravenously. Moderate Sedation Time: 14 minutes. The patient's level of consciousness and vital signs were monitored continuously by radiology nursing throughout the procedure under my direct supervision. FLUOROSCOPY TIME:  Fluoroscopy Time: 0 minutes 48 seconds (4 mGy). COMPLICATIONS: None PROCEDURE: Informed written consent was obtained from the patient after a discussion of the risks, benefits, and alternatives to treatment. Questions regarding the procedure were encouraged and answered. The right neck and chest were prepped with chlorhexidine in a sterile fashion, and a sterile drape was applied covering the operative field. Maximum barrier sterile technique with sterile gowns and gloves were used for the procedure. A timeout was performed prior to the initiation of the procedure. Ultrasound survey was performed. Micropuncture kit was utilized to access the right internal jugular vein under direct, real-time ultrasound guidance after  the overlying soft tissues were anesthetized with 1% lidocaine with epinephrine. Stab incision was made with 11 blade scalpel. Microwire was passed centrally. The microwire was then marked to measure appropriate internal catheter length. External tunneled length was estimated. A total tip to cuff length of 23 cm was selected. 035 guidewire was advanced to the level of the IVC. Skin and subcutaneous tissues of chest wall below the clavicle were generously infiltrated with 1% lidocaine for local anesthesia. A small stab incision was made with 11 blade scalpel. The selected hemodialysis catheter was tunneled in a retrograde fashion from the anterior chest wall to the venotomy incision. Serial dilation was performed and then a peel-away sheath was placed. The catheter was then placed through the peel-away sheath with tips ultimately positioned within the superior aspect of the right atrium. Final catheter positioning was confirmed and documented with a spot radiographic image. The catheter aspirates and flushes normally. The catheter was flushed with appropriate volume heparin dwells. The catheter exit site was secured  with a 0-Prolene retention suture. Gel-Foam slurry was infused into the soft tissue tract. The venotomy incision was closed Derma bond and sterile dressing. Dressings were applied at the chest wall. Patient tolerated the procedure well and remained hemodynamically stable throughout. No complications were encountered and no significant blood loss encountered. IMPRESSION: Status post right IJ tunneled hemodialysis catheter. Signed, Dulcy Fanny. Dellia Nims, RPVI [facility] Radiology Electronically Signed   By: Corrie Mckusick D.O.   On: 01/24/2020 17:27   DG CHEST PORT 1 VIEW  Result Date: 02/05/2020 CLINICAL DATA:  COVID-19, new onset dialysis EXAM: PORTABLE CHEST 1 VIEW COMPARISON:  01/22/2020 FINDINGS: Mild subpleural opacities in the right upper lobe and  left upper lobe/lingula. Mild retrocardiac opacity. These findings favor multifocal pneumonia in this patient with known COVID. No pleural effusion or pneumothorax. Cardiomegaly. Right IJ dual lumen dialysis catheter terminating in the upper right atrium. IMPRESSION: Multifocal pneumonia in this patient with known COVID. Electronically Signed   By: Julian Hy M.D.   On: 02/05/2020 00:57   DG CHEST PORT 1 VIEW  Result Date: 01/22/2020 CLINICAL DATA:  Shortness of breath EXAM: PORTABLE CHEST 1 VIEW COMPARISON:  11/30/2018 FINDINGS: Cardiomegaly and aortic tortuosity. There is no edema, consolidation, effusion, or pneumothorax. Hazy density at the right apex is chronic and attributed to ectatic vessels based on 2018 chest CT. IMPRESSION: Cardiomegaly without failure. Electronically Signed   By: Monte Fantasia M.D.   On: 01/22/2020 07:06   CT RENAL STONE STUDY  Result Date: 01/22/2020 CLINICAL DATA:  74 year old male with a history of flank pain EXAM: CT ABDOMEN AND PELVIS WITHOUT CONTRAST TECHNIQUE: Multidetector CT imaging of the abdomen and pelvis was performed following the standard protocol without IV contrast. COMPARISON:  Ultrasound 06/26/2019, CT 06/01/2019 FINDINGS: Lower chest: No acute finding of the lower chest. Hepatobiliary: Similar appearance of liver with small right-sided hypodense focus measuring 11 mm and additional small subcentimeter hypodense foci within the right liver. Gallbladder decompressed. Pancreas: Unremarkable Spleen: Unremarkable Adrenals/Urinary Tract: - Right adrenal gland:  Unremarkable - Left adrenal gland: Unremarkable. - Right kidney: Similar appearance of the right kidney with multiple rounded lesions some of which are hyperdense and some of which are low-density. Complex lesion at the inferior renal cortex on the right estimated 4.7 cm x 4.5 cm, similar to the comparison. No evidence of hydronephrosis or nephrolithiasis. Unremarkable course of the right ureter. -  Left Kidney: No evidence of left-sided hydronephrosis. Unremarkable course of the left ureter. Redemonstration of multiple low-density and high density lesions of left kidney. 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 The axial dimensions on the prior CT measured 7.2 cm x 6.2 cm while the axial dimensions on the current CT are estimated 7.5 cm x 8.7 cm - Urinary Bladder: Urinary bladder is relatively unremarkable partially distended. Impression on the bladder base secondary to the median lobe of the prostate. Stomach/Bowel: - Stomach: Unremarkable. - Small bowel: Unremarkable - Appendix: Appendix is not visualized, however, no inflammatory changes are present adjacent to the cecum to indicate an appendicitis. - Colon: Surgical changes in the region of the cecum. Redemonstration of ventral hernia containing segment of transverse colon. No evidence of obstruction. Colonic diverticula. No inflammatory changes. No significant stool burden. Vascular/Lymphatic: Atherosclerotic calcifications of the abdominal aorta. Similar diameter of the infrarenal abdominal aorta with similar course caliber and  contour. Greatest diameter measures 28 mm, similar to the comparison. Atherosclerotic changes of the bilateral iliac arteries. No lymphadenopathy. Reproductive: Enlarged prostate with the diameter on axial images measuring 6.7 cm x 8.2 cm. Impression on the bladder base from the median lobe. Other: Small umbilical hernia which again appears to have a knuckle of small bowel entering the hernia sac. No evidence of associated obstruction. Supraumbilical ventral wall hernia again noted containing short segment of transverse colon. No significant inflammatory changes or evidence of obstruction. Bilateral fat containing inguinal hernia with trace fluid on the left. Musculoskeletal: No acute displaced  fracture. Multilevel degenerative changes of the lumbar spine with multiple Schmorl's nodes. Vacuum disc phenomenon present at all levels except for L4-L5. No bony canal narrowing with posterior disc bulges at all levels. Degenerative changes of the bilateral hips. IMPRESSION: CT is negative for nephrolithiasis or hydronephrosis. No acute CT finding identified that would account for flank pain. 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 Urology referral is indicated if not previously completed. Redemonstration of additional bilateral renal lesions, incompletely characterized on the current CT. This includes a complex lesion at the inferior right kidney which may represent a partially cystic additional renal cell carcinoma. Again, urology referral is indicated. Unchanged appearance of a supraumbilical ventral hernia containing transverse colon. Unchanged appearance of umbilical hernia which contains short segment of small bowel, without obstruction. Prostatomegaly. Aortic Atherosclerosis (ICD10-I70.0). Unchanged diameter of the infrarenal abdominal aorta measuring 2.8 cm. Ectatic abdominal aorta at risk for aneurysm development. Recommend followup by ultrasound in 5 years. This recommendation follows ACR consensus guidelines: White Paper of the ACR Incidental Findings Committee II on Vascular Findings. J Am Coll Radiol 2013; 10:789-794. Additional ancillary findings as above. Electronically Signed   By: Corrie Mckusick D.O.   On: 01/22/2020 12:38     Phillips Climes M.D on 02/06/2020 at 10:44 AM    [facility] -  Office  [phone]

## 2020-02-06 NOTE — Progress Notes (Signed)
[facility] Progress Note  Subjective:  Patient not examined today directly given COVID-19 + status, utilizing data taken from chart +/- discussions w/ providers and staff.    Vitals:   02/05/20 2009 02/05/20 2025 02/06/20 0405 02/06/20 1458  BP:   (!) 155/99 139/66  Pulse:   63 60  Resp:   19 16  Temp:   97.9 F (36.6 C) 97.7 F (36.5 C)  TempSrc:   Oral Oral  SpO2:   94% 96%  Weight: 93.5 kg 92.8 kg    Height:        Exam:  Patient not examined today directly given COVID-19 + status, utilizing data taken from chart +/- discussions w/ providers and staff.      Home meds:  - norvasc 10/ lipitor 40/ metoprolol 12.5 bid  - flomax 0.4/ norco qid prn  - prn's/ vitamins/ supplements     Na 137 BUN 38 Cr 9.57   CO2 22    CXR - bilat small infiltrates    OP HD: TTS [facility]  4h 64mn  88kg just raised  LUA AVF/ TDC  P4  2/2 bath Hep none (^IDWG ~5kg)  - no ESA, VDRA or Fe   Assessment/ Plan: 1. Gen weakness - multifact w/ COVID+ infection/ pna, also anemia w/ Hb 6.2 on admission. Is now SP 2u prbc. And is getting IV remdesivir / decadron as per COVID protocol.  2. Anemia ckd/ malignancy - sp 2u prbc this am. Get OP records for any esa Rx 3. ESRD - recent onset, due to myeloma, on HD < 1 mo. HD bumped from yest to today, will get this evening.   4. BP/volume - cont BP meds. Up 3-4 kg by wts. No vol excess on last exam 5. 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 Multiple myeloma - seen by [facility], see their note 7. BPH - on flomax   Rob Wilma Wuthrich 02/06/2020, 5:21 PM   Recent Labs  Lab 02/04/20 1930 02/04/20 1930 02/05/20 1218 02/06/20 0201  K 4.6  --  4.9  --   BUN 38*  --  46*  --   CREATININE 9.57*  --  10.45*  --   CALCIUM 8.7*  --  8.8*  --   HGB 6.2*   < > 7.9* 7.9*   < > = values in this interval not displayed.   Inpatient medications: . amLODipine  10 mg Oral Daily  . atorvastatin  40 mg Oral Daily  .  Chlorhexidine Gluconate Cloth  6 each Topical Q0600  . dexamethasone (DECADRON) injection  6 mg Intravenous Q24H  . feeding supplement (NEPRO CARB STEADY)  237 mL Oral BID BM  . heparin  5,000 Units Subcutaneous Q8H  . insulin aspart  0-5 Units Subcutaneous QHS  . insulin aspart  0-6 Units Subcutaneous TID WC  . metoprolol tartrate  12.5 mg Oral BID  . multivitamin  1 tablet Oral QHS  . pantoprazole  40 mg Oral Daily  . tamsulosin  0.4 mg Oral Daily   . remdesivir 100 mg in NS 100 mL 100 mg (02/06/20 1151)   acetaminophen **OR** acetaminophen, ALPRAZolam, melatonin

## 2020-02-07 DIAGNOSIS — I10 Essential (primary) hypertension: Secondary | ICD-10-CM

## 2020-02-07 DIAGNOSIS — J1282 Pneumonia due to coronavirus disease 2019: Secondary | ICD-10-CM

## 2020-02-07 LAB — CBC
HCT: 28.3 % — ABNORMAL LOW (ref 39.0–52.0)
Hemoglobin: 9.2 g/dL — ABNORMAL LOW (ref 13.0–17.0)
MCH: 31.1 pg (ref 26.0–34.0)
MCHC: 32.5 g/dL (ref 30.0–36.0)
MCV: 95.6 fL (ref 80.0–100.0)
Platelets: 218 10*3/uL (ref 150–400)
RBC: 2.96 MIL/uL — ABNORMAL LOW (ref 4.22–5.81)
RDW: 15.9 % — ABNORMAL HIGH (ref 11.5–15.5)
WBC: 5.8 10*3/uL (ref 4.0–10.5)
nRBC: 0 % (ref 0.0–0.2)

## 2020-02-07 LAB — BPAM RBC
Blood Product Expiration Date: 202106232359
Blood Product Expiration Date: 202106282359
Blood Product Expiration Date: 202106282359
ISSUE DATE / TIME: 202106042044
ISSUE DATE / TIME: 202106050127
ISSUE DATE / TIME: 202106062034
Unit Type and Rh: 6200
Unit Type and Rh: 6200
Unit Type and Rh: 6200

## 2020-02-07 LAB — TYPE AND SCREEN
ABO/RH(D): A POS
Antibody Screen: NEGATIVE
Unit division: 0
Unit division: 0
Unit division: 0

## 2020-02-07 LAB — GLUCOSE, CAPILLARY
Glucose-Capillary: 101 mg/dL — ABNORMAL HIGH (ref 70–99)
Glucose-Capillary: 113 mg/dL — ABNORMAL HIGH (ref 70–99)
Glucose-Capillary: 121 mg/dL — ABNORMAL HIGH (ref 70–99)
Glucose-Capillary: 86 mg/dL (ref 70–99)
Glucose-Capillary: 92 mg/dL (ref 70–99)

## 2020-02-07 LAB — C-REACTIVE PROTEIN: CRP: 2.2 mg/dL — ABNORMAL HIGH (ref <1.0)

## 2020-02-07 NOTE — TOC Initial Note (Signed)
Transition of Care [facility]) - Initial/Assessment Note    Patient Details  Name: [name] [name] MRN: [id number] Date of Birth: Sep 28, 1945  [facility] CM/SW Contact:    Pollie Friar, RN Phone Number: 02/07/2020, 3:44 PM  Clinical Narrative:                 Pt is from home alone. Was set up with [facility] at last admission for PT/RN/SW.  Pt is asking for aide services in the home. CM has started the medicaid aide form and will fax it into [employer] when pt is d/ced.  Pt states he is able to drive self to HD and is eager to get to his chemo treatments.  TOC following and will need resumption orders for [facility] services at d/c.  Expected Discharge Plan: Home/Self Care Barriers to Discharge: Continued Medical Work up   Patient Goals and CMS Choice        Expected Discharge Plan and Services Expected Discharge Plan: Home/Self Care   Discharge Planning Services: CM Consult   Living arrangements for the past 2 months: Single Family Home                                      Prior Living Arrangements/Services Living arrangements for the past 2 months: Single Family Home Lives with:: Self Patient language and need for interpreter reviewed:: Yes Do you feel safe going back to the place where you live?: Yes      Need for Family Participation in Patient Care: Yes (Comment) Care giver support system in place?: No (comment)   Criminal Activity/Legal Involvement Pertinent to Current Situation/Hospitalization: No - Comment as needed  Activities of Daily Living Home Assistive Devices/Equipment: CBG Meter, Eyeglasses ADL Screening (condition at time of admission) Patient's cognitive ability adequate to safely complete daily activities?: Yes Is the patient deaf or have difficulty hearing?: Yes Does the patient have difficulty seeing, even when wearing glasses/contacts?: No Does the patient have difficulty concentrating, remembering, or making decisions?: No Patient able  to express need for assistance with ADLs?: Yes Does the patient have difficulty dressing or bathing?: No Independently performs ADLs?: Yes (appropriate for developmental age) Does the patient have difficulty walking or climbing stairs?: Yes Weakness of Legs: None Weakness of Arms/Hands: Left  Permission Sought/Granted                  Emotional Assessment   Attitude/Demeanor/Rapport: Engaged Affect (typically observed): Accepting Orientation: : Oriented to Self, Oriented to Place, Oriented to  Time, Oriented to Situation   Psych Involvement: No (comment)  Admission diagnosis:  Symptomatic anemia [D64.9] Lab test positive for detection of COVID-19 virus [U07.1] Patient Active Problem List   Diagnosis Date Noted  . Symptomatic anemia 02/04/2020  . ESRD (end stage renal disease) ([place]) 01/27/2020  . Goals of care, counseling/discussion   . Palliative care by specialist   . DNR (do not resuscitate) discussion   . ARF (acute renal failure) ([place]) 01/22/2020  . Ascending aortic aneurysm ([place]) 08/04/2019  . Myeloma kidney ([place])   . CKD (chronic kidney disease), stage III 06/04/2019  . Renal cell carcinoma ([place]) 06/04/2019  . Gross hematuria 06/04/2019  . COPD exacerbation ([place]) 11/05/2018  . Palpitations 10/22/2018  . Counseling regarding advance care planning and goals of care 06/22/2018  . AKI (acute kidney injury) ([place])   . Anemia   . Sepsis ([place])  06/05/2018  . Acute lower UTI 06/05/2018  . Colon polyp 05/21/2018  . Aortic valve regurgitation 04/14/2018  . Medication management 04/14/2018  . Thoracic aortic aneurysm without rupture ([place]) 09/29/2017  . 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 Head trauma, subsequent encounter 10/12/2016  . 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 Spinal stenosis in cervical region 10/12/2016  . Cervical spondylosis 10/12/2016  . CAD (coronary  artery disease)   . Chronic diastolic CHF (congestive heart failure) ([place])   . Hyperlipidemia   . Hypertensive heart disease   . Paroxysmal atrial fibrillation ([place]) 12/17/2015  . 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 Mild intermittent asthma 10/18/2015  . Aortic root dilatation ([place]) 10/17/2015  . Diabetes ([place]) 10/17/2015  . 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 Hand paresthesia 12/09/2014  . Elevated prostate specific antigen (PSA) 07/12/2014  . Allergic rhinitis 07/12/2014  . LBP (low back pain) 07/12/2014  . Palpitation 07/11/2014  . Patient's other noncompliance with medication regimen 08/10/2013  . Chronic venous insufficiency 07/23/2012  . Obstructive apnea 04/11/2011  . Essential (primary) hypertension 09/07/2010  . 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 Anxiety, generalized 04/04/2010  . Cardiac conduction disorder 02/27/2010  . BPH (benign prostatic hyperplasia) 09/13/2009  . Abnormal findings on examination of genitourinary organs 08/09/2009  . Hereditary and idiopathic neuropathy 07/05/2009   PCP:  Nolene Ebbs, MD Pharmacy:   [id number] - [place], [place] [address] [place] [address] Phone: [phone] Fax: [phone]     Social Determinants of Health (SDOH) Interventions    Readmission Risk Interventions Readmission Risk Prevention Plan 06/29/2019  Transportation Screening Complete  Medication Review ([place]) Complete  PCP or Specialist appointment within 3-5 days of discharge Complete  HRI or [place] Complete  SW Recovery Care/Counseling Consult Complete  [place] Not Applicable  Some recent data might be hidden

## 2020-02-07 NOTE — Progress Notes (Signed)
Renal Navigator received return call from patient's son/Shelton, who states that patient will have transportation by family to get to OP HD isolation shift at discharge. He is now aware of schedule and is appreciative of the information.  Alphonzo Cruise, [place] Renal Navigator [phone]

## 2020-02-07 NOTE — Progress Notes (Signed)
Renal Navigator notes patient's positive COVID 19 test result and notified OP HD clinic/[facility]. Patient will need to treat in isolation until he receives 2 negative tests per [facility] Director. [name] treats patients in isolation on a TTS 3rd shift schedule.  Patient is known to Renal Navigator as he was a new start to HD at the end of May, 2021. Renal Navigator spoke with patient's son/[name] at that time and left message for son today, requesting call back. Navigator will discuss isolation schedule with patient and patient's son and ensure that he has transportation to/from HD treatment at discharge.  Alphonzo Cruise, [place] Renal Navigator [phone]

## 2020-02-07 NOTE — Progress Notes (Signed)
PROGRESS NOTE                                                                                                                                                                                                             Patient Demographics:    [name] [name], is a 74 y.o. male, DOB - 1946-03-09, UTM:[id number]  Admit date - 02/04/2020   Admitting Physician Shela Leff, MD  Outpatient Primary MD for the patient is Nolene Ebbs, MD  LOS - 2   No chief complaint on file.      Brief Narrative    [name] [name] is a 74 y.o. male with medical history significant of multiple myeloma, renal mass, anemia, paroxysmal A. fib, CAD, ESRD recently started on HD, chronic diastolic CHF, COPD, diet-controlled type 2 diabetes, hypertension, hyperlipidemia, BPH presented to the ED via EMS for evaluation of generalized weakness.  Patient states he has been feeling weak for the past 3 weeks. He went to see his oncologist and was told treatment could not be started for his multiple myeloma as his hemoglobin was too low.  He came into the hospital to get blood transfusions.  Denies hematemesis, hematochezia, or melena.  Denies dizziness/lightheadedness, chest pain, shortness of breath, cough, fevers, chills, nausea, vomiting, abdominal pain, or diarrhea.  Hemoglobin 6.2, previously in the 7-8 range.  FOBT negative.  Potassium 4.6, bicarb 28.  UA pending.  Screening SARS-CoV-2 PCR test positive.    Subjective:    [name] today feeling better today, no dyspnea, cough has resolved, weakness significantly improved .  Assessment  & Plan :    Principal Problem:   Symptomatic anemia Active Problems:   Essential (primary) hypertension   BPH (benign prostatic hyperplasia)   Diabetes (HCC)   ESRD (end stage renal disease) (HCC)    Acute on chronic symptomatic anemia/anemia of chronic kidney disease. -No evidence or signs of GI bleed, anemia in the setting of advanced kidney  disease, ESRD patient, for myeloma .IV iron and Procrit per nephrology. -Transfused 2 unit PRBC, globin remains low at 7.9, he was transfused another unit yesterday so he can keep his hemoglobin at least above 8, so he can start his therapy for his multiple myeloma by his oncologist .  [name] CBC this am still pending. -Patient is not endorsing any symptoms of GI bleed.  FOBT negative.  Hemoglobin 6.2, previously in the 7-8 range.  Hemodynamically stable.  COVID-19 pneumonia: -Chest x-ray is concerning for multifocal pneumonia due to COVID-19 infection, and the patient with no hypoxia, but he is significantly immunocompromise, with multiple myeloma, renal malignancy and immunocompromised, so we will go ahead and continue with IV Decadron and remdesivir. -follow Inflammatory markers -Patient did not receive COVID  vaccine. -Patient had previous episodes of COVID-19 infection in December 2020. COVID-19 Labs  Recent Labs    02/05/20 1218 02/06/20 0201  DDIMER 4.50*  --   FERRITIN 894*  --   CRP 2.6* 2.2*    Lab Results  Component Value Date   SARSCOV2NAA POSITIVE (A) 02/04/2020   [name] NEGATIVE 01/22/2020   SARSCOV2NAA DETECTED (A) 08/23/2019   [place] NEGATIVE 06/26/2019     ESRD on HD TTS: -Renal consulted.  Multiple myeloma:  - Seen by oncology on 5/24 and due to his poor performance status and noncompliance he is felt to be a poor candidate for systemic myeloma treatment but if he is agreeable/tolerant with ongoing HD and has a clear plan to address his renal mass, oncology would consider restarting systemic myeloma treatment as outpatient.  History of left renal mass:  -Highly suspicious for renal cell carcinoma and patient has declined therapy in the past. -Discussed these findings with patient, he is aware of it, and reports he will follow with urology as an outpatient. -Have discussed these findings both patient, and son, and both are aware of it, and the son  reports his dad has declined therapy/kidney resection surgery .  Diet-controlled type 2 diabetes -Sliding scale insulin very sensitive ACHS and CBG checks.  Hypertension: Stable. -Continue home amlodipine and metoprolol.  Hyperlipidemia -Continue home Lipitor  BPH -Continue Flomax   COVID-19 Labs  Recent Labs    02/05/20 1218 02/06/20 0201  DDIMER 4.50*  --   FERRITIN 894*  --   CRP 2.6* 2.2*    Lab Results  Component Value Date   SARSCOV2NAA POSITIVE (A) 02/04/2020   SARSCOV2NAA NEGATIVE 01/22/2020   SARSCOV2NAA DETECTED (A) 08/23/2019   [place] NEGATIVE 06/26/2019     Code Status : Full  Family Communication  : D/W son [name] via phone 6/6  Disposition Plan  :  Status is: inpatient  The patient will require care spanning > 2 midnights and should be moved to inpatient because: IV treatments appropriate due to intensity of illness or inability to take PO  Dispo: The patient is from: Home              Anticipated d/c is to: Home              Anticipated d/c date is: 2 days              Patient currently is not medically stable to d/c.  Will need to finish IV remdesivir and steroids  for COVID-19 pneumonia     Consults  :  renal  Procedures  : None  DVT Prophylaxis  : [place] heparin  Lab Results  Component Value Date   PLT 191 02/06/2020    Antibiotics  :    Anti-infectives (From admission, onward)   Start     Dose/Rate Route Frequency Ordered Stop   02/06/20 1000  remdesivir 100 mg in sodium chloride 0.9 % 100 mL IVPB     100 mg 200 mL/hr over 30 Minutes Intravenous Daily 02/05/20 0511 02/10/20 0959   02/05/20 0600  remdesivir 200 mg in  sodium chloride 0.9% 250 mL IVPB     200 mg 580 mL/hr over 30 Minutes Intravenous Once 02/05/20 0511 02/05/20 1057        Objective:   Vitals:   02/06/20 2350 02/07/20 0000 02/07/20 0340 02/07/20 0948  BP:  (!) 157/84 (!) 157/90 124/78  Pulse: 71 64 70 84  Resp:  20 20   Temp:   98.3 F (36.8 C)     TempSrc:  Oral Oral   SpO2:  96% 93%   Weight:      Height:        Wt Readings from Last 3 Encounters:  02/06/20 93.5 kg  01/27/20 88.8 kg  01/12/20 90.3 kg     Intake/Output Summary (Last 24 hours) at 02/07/2020 1046 Last data filed at 02/07/2020 0539 Gross per 24 hour  Intake 460 ml  Output 3268 ml  Net -2808 ml     Physical Exam  Awake Alert, Oriented X 3, No new F.N deficits, Normal affect Symmetrical Chest wall movement, Good air movement bilaterally, CTAB RRR,No Gallops,Rubs or new Murmurs, No Parasternal Heave +ve B.Sounds, Abd Soft, large midline abdominal wall hernia, nontender, reproducible, no tenderness, No rebound - guarding or rigidity. No Cyanosis, Clubbing or edema, No new Rash or bruise      Data Review:    CBC Recent Labs  Lab 02/04/20 1930 02/05/20 1218 02/06/20 0201  WBC 5.5 6.3 7.3  HGB 6.2* 7.9* 7.9*  HCT 19.9* 23.9* 24.1*  PLT 170 195 191  MCV 101.5* 96.8 95.6  MCH 31.6 32.0 31.3  MCHC 31.2 33.1 32.8  RDW 15.0 15.7* 15.2  LYMPHSABS 1.0  --   --   MONOABS 0.7  --   --   EOSABS 0.1  --   --   BASOSABS 0.0  --   --     Chemistries  Recent Labs  Lab 02/04/20 1930 02/05/20 1218  NA 137 136  K 4.6 4.9  CL 97* 96*  CO2 28 27  GLUCOSE 81 134*  BUN 38* 46*  CREATININE 9.57* 10.45*  CALCIUM 8.7* 8.8*   ------------------------------------------------------------------------------------------------------------------ No results for input(s): CHOL, HDL, LDLCALC, TRIG, CHOLHDL, LDLDIRECT in the last 72 hours.  Lab Results  Component Value Date   HGBA1C 5.2 06/26/2019   ------------------------------------------------------------------------------------------------------------------ No results for input(s): TSH, T4TOTAL, T3FREE, THYROIDAB in the last 72 hours.  Invalid input(s): FREET3 ------------------------------------------------------------------------------------------------------------------ Recent Labs    02/05/20 1218   FERRITIN 894*    Coagulation profile No results for input(s): INR, PROTIME in the last 168 hours.  Recent Labs    02/05/20 1218  DDIMER 4.50*    Cardiac Enzymes No results for input(s): CKMB, TROPONINI, MYOGLOBIN in the last 168 hours.  Invalid input(s): CK ------------------------------------------------------------------------------------------------------------------    Component Value Date/Time   BNP 36.2 09/09/2018 1834    Inpatient Medications  Scheduled Meds: . amLODipine  10 mg Oral Daily  . atorvastatin  40 mg Oral Daily  . Chlorhexidine Gluconate Cloth  6 each Topical Q0600  . dexamethasone (DECADRON) injection  6 mg Intravenous Q24H  . feeding supplement (NEPRO CARB STEADY)  237 mL Oral BID BM  . heparin  5,000 Units Subcutaneous Q8H  . insulin aspart  0-5 Units Subcutaneous QHS  . insulin aspart  0-6 Units Subcutaneous TID WC  . metoprolol tartrate  12.5 mg Oral BID  . multivitamin  1 tablet Oral QHS  . pantoprazole  40 mg Oral Daily  . tamsulosin  0.4 mg Oral Daily  Continuous Infusions: . remdesivir 100 mg in NS 100 mL 100 mg (02/07/20 0953)   PRN Meds:.acetaminophen **OR** acetaminophen, ALPRAZolam, melatonin  Micro Results Recent Results (from the past 240 hour(s))  SARS Coronavirus 2 by RT PCR (hospital order, performed in [facility] hospital lab) Nasopharyngeal Nasopharyngeal Swab     Status: Abnormal   Collection Time: 02/04/20  8:35 PM   Specimen: Nasopharyngeal Swab  Result Value Ref Range Status   SARS Coronavirus 2 POSITIVE (A) NEGATIVE Final    Comment: RESULT CALLED TO, READ BACK BY AND VERIFIED WITH: A CAIN RN 2155 02/04/20 A BROWNING (NOTE) SARS-CoV-2 target nucleic acids are DETECTED SARS-CoV-2 RNA is generally detectable in upper respiratory specimens  during the acute phase of infection.  Positive results are indicative  of the presence of the identified virus, but do not rule out bacterial infection or co-infection with other  pathogens not detected by the test.  Clinical correlation with patient history and  other diagnostic information is necessary to determine patient infection status.  The expected result is negative. Fact Sheet for Patients:   StrictlyIdeas.no  Fact Sheet for Healthcare Providers:   BankingDealers.co.za   This test is not yet approved or cleared by the [place] FDA and  has been authorized for detection and/or diagnosis of SARS-CoV-2 by FDA under an Emergency Use Authorization (EUA).  This EUA will remain in effect (meaning this test can be  used) for the duration of  the COVID-19 declaration under Section 564(b)(1) of the Act, 21 U.S.C. section 360-bbb-3(b)(1), unless the authorization is terminated or revoked sooner. Performed at [facility], [place] [address] [place], [place] [address]   MRSA PCR Screening     Status: None   Collection Time: 02/05/20  8:54 PM   Specimen: Nasal Mucosa; Nasopharyngeal  Result Value Ref Range Status   MRSA by PCR NEGATIVE NEGATIVE Final    Comment:        The GeneXpert MRSA Assay (FDA approved for NASAL specimens only), is one component of a comprehensive MRSA colonization surveillance program. It is not intended to diagnose MRSA infection nor to guide or monitor treatment for MRSA infections. Performed at [facility], [place] [address] [place], [place] [address]     Radiology Reports IR Fluoro Guide CV Line Right  Result Date: 01/24/2020 INDICATION: 74 year old male referred for tunneled hemodialysis catheter EXAM: TUNNELED CENTRAL VENOUS HEMODIALYSIS CATHETER PLACEMENT WITH ULTRASOUND AND FLUOROSCOPIC GUIDANCE MEDICATIONS: 1 g vancomycin. The antibiotic was given in an appropriate time interval prior to skin puncture. ANESTHESIA/SEDATION: Moderate (conscious) sedation was employed during this procedure. A total of Versed 1.0 mg and Fentanyl 25 mcg was administered intravenously.  Moderate Sedation Time: 14 minutes. The patient's level of consciousness and vital signs were monitored continuously by radiology nursing throughout the procedure under my direct supervision. FLUOROSCOPY TIME:  Fluoroscopy Time: 0 minutes 48 seconds (4 mGy). COMPLICATIONS: None PROCEDURE: Informed written consent was obtained from the patient after a discussion of the risks, benefits, and alternatives to treatment. Questions regarding the procedure were encouraged and answered. The right neck and chest were prepped with chlorhexidine in a sterile fashion, and a sterile drape was applied covering the operative field. Maximum barrier sterile technique with sterile gowns and gloves were used for the procedure. A timeout was performed prior to the initiation of the procedure. Ultrasound survey was performed. Micropuncture kit was utilized to access the right internal jugular vein under direct, real-time ultrasound guidance after the overlying soft  tissues were anesthetized with 1% lidocaine with epinephrine. Stab incision was made with 11 blade scalpel. Microwire was passed centrally. The microwire was then marked to measure appropriate internal catheter length. External tunneled length was estimated. A total tip to cuff length of 23 cm was selected. 035 guidewire was advanced to the level of the IVC. Skin and subcutaneous tissues of chest wall below the clavicle were generously infiltrated with 1% lidocaine for local anesthesia. A small stab incision was made with 11 blade scalpel. The selected hemodialysis catheter was tunneled in a retrograde fashion from the anterior chest wall to the venotomy incision. Serial dilation was performed and then a peel-away sheath was placed. The catheter was then placed through the peel-away sheath with tips ultimately positioned within the superior aspect of the right atrium. Final catheter positioning was confirmed and documented with a spot radiographic image. The catheter aspirates  and flushes normally. The catheter was flushed with appropriate volume heparin dwells. The catheter exit site was secured with a 0-Prolene retention suture. Gel-Foam slurry was infused into the soft tissue tract. The venotomy incision was closed Derma bond and sterile dressing. Dressings were applied at the chest wall. Patient tolerated the procedure well and remained hemodynamically stable throughout. No complications were encountered and no significant blood loss encountered. IMPRESSION: Status post right IJ tunneled hemodialysis catheter. Signed, Dulcy Fanny. Dellia Nims, RPVI [facility] Radiology Electronically Signed   By: Corrie Mckusick D.O.   On: 01/24/2020 17:27   IR US Guide Vasc Access Right  Result Date: 01/24/2020 INDICATION: 74 year old male referred for tunneled hemodialysis catheter EXAM: TUNNELED CENTRAL VENOUS HEMODIALYSIS CATHETER PLACEMENT WITH ULTRASOUND AND FLUOROSCOPIC GUIDANCE MEDICATIONS: 1 g vancomycin. The antibiotic was given in an appropriate time interval prior to skin puncture. ANESTHESIA/SEDATION: Moderate (conscious) sedation was employed during this procedure. A total of Versed 1.0 mg and Fentanyl 25 mcg was administered intravenously. Moderate Sedation Time: 14 minutes. The patient's level of consciousness and vital signs were monitored continuously by radiology nursing throughout the procedure under my direct supervision. FLUOROSCOPY TIME:  Fluoroscopy Time: 0 minutes 48 seconds (4 mGy). COMPLICATIONS: None PROCEDURE: Informed written consent was obtained from the patient after a discussion of the risks, benefits, and alternatives to treatment. Questions regarding the procedure were encouraged and answered. The right neck and chest were prepped with chlorhexidine in a sterile fashion, and a sterile drape was applied covering the operative field. Maximum barrier sterile technique with sterile gowns and gloves were used for the  procedure. A timeout was performed prior to the initiation of the procedure. Ultrasound survey was performed. Micropuncture kit was utilized to access the right internal jugular vein under direct, real-time ultrasound guidance after the overlying soft tissues were anesthetized with 1% lidocaine with epinephrine. Stab incision was made with 11 blade scalpel. Microwire was passed centrally. The microwire was then marked to measure appropriate internal catheter length. External tunneled length was estimated. A total tip to cuff length of 23 cm was selected. 035 guidewire was advanced to the level of the IVC. Skin and subcutaneous tissues of chest wall below the clavicle were generously infiltrated with 1% lidocaine for local anesthesia. A small stab incision was made with 11 blade scalpel. The selected hemodialysis catheter was tunneled in a retrograde fashion from the anterior chest wall to the venotomy incision. Serial dilation was performed and then a peel-away sheath was placed. The catheter was then placed through the peel-away sheath with tips ultimately positioned within the superior  aspect of the right atrium. Final catheter positioning was confirmed and documented with a spot radiographic image. The catheter aspirates and flushes normally. The catheter was flushed with appropriate volume heparin dwells. The catheter exit site was secured with a 0-Prolene retention suture. Gel-Foam slurry was infused into the soft tissue tract. The venotomy incision was closed Derma bond and sterile dressing. Dressings were applied at the chest wall. Patient tolerated the procedure well and remained hemodynamically stable throughout. No complications were encountered and no significant blood loss encountered. IMPRESSION: Status post right IJ tunneled hemodialysis catheter. Signed, Dulcy Fanny. Dellia Nims, RPVI [facility] Radiology Electronically Signed   By: Corrie Mckusick D.O.   On:  01/24/2020 17:27   DG CHEST PORT 1 VIEW  Result Date: 02/05/2020 CLINICAL DATA:  COVID-19, new onset dialysis EXAM: PORTABLE CHEST 1 VIEW COMPARISON:  01/22/2020 FINDINGS: Mild subpleural opacities in the right upper lobe and left upper lobe/lingula. Mild retrocardiac opacity. These findings favor multifocal pneumonia in this patient with known COVID. No pleural effusion or pneumothorax. Cardiomegaly. Right IJ dual lumen dialysis catheter terminating in the upper right atrium. IMPRESSION: Multifocal pneumonia in this patient with known COVID. Electronically Signed   By: Julian Hy M.D.   On: 02/05/2020 00:57   DG CHEST PORT 1 VIEW  Result Date: 01/22/2020 CLINICAL DATA:  Shortness of breath EXAM: PORTABLE CHEST 1 VIEW COMPARISON:  11/30/2018 FINDINGS: Cardiomegaly and aortic tortuosity. There is no edema, consolidation, effusion, or pneumothorax. Hazy density at the right apex is chronic and attributed to ectatic vessels based on 2018 chest CT. IMPRESSION: Cardiomegaly without failure. Electronically Signed   By: Monte Fantasia M.D.   On: 01/22/2020 07:06   CT RENAL STONE STUDY  Result Date: 01/22/2020 CLINICAL DATA:  74 year old male with a history of flank pain EXAM: CT ABDOMEN AND PELVIS WITHOUT CONTRAST TECHNIQUE: Multidetector CT imaging of the abdomen and pelvis was performed following the standard protocol without IV contrast. COMPARISON:  Ultrasound 06/26/2019, CT 06/01/2019 FINDINGS: Lower chest: No acute finding of the lower chest. Hepatobiliary: Similar appearance of liver with small right-sided hypodense focus measuring 11 mm and additional small subcentimeter hypodense foci within the right liver. Gallbladder decompressed. Pancreas: Unremarkable Spleen: Unremarkable Adrenals/Urinary Tract: - Right adrenal gland:  Unremarkable - Left adrenal gland: Unremarkable. - Right kidney: Similar appearance of the right kidney with multiple rounded lesions some of which are hyperdense and some of  which are low-density. Complex lesion at the inferior renal cortex on the right estimated 4.7 cm x 4.5 cm, similar to the comparison. No evidence of hydronephrosis or nephrolithiasis. Unremarkable course of the right ureter. - Left Kidney: No evidence of left-sided hydronephrosis. Unremarkable course of the left ureter. Redemonstration of multiple low-density and high density lesions of left kidney. 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 The axial dimensions on the prior CT measured 7.2 cm x 6.2 cm while the axial dimensions on the current CT are estimated 7.5 cm x 8.7 cm - Urinary Bladder: Urinary bladder is relatively unremarkable partially distended. Impression on the bladder base secondary to the median lobe of the prostate. Stomach/Bowel: - Stomach: Unremarkable. - Small bowel: Unremarkable - Appendix: Appendix is not visualized, however, no inflammatory changes are present adjacent to the cecum to indicate an appendicitis. - Colon: Surgical changes in the region of the cecum. Redemonstration of ventral  hernia containing segment of transverse colon. No evidence of obstruction. Colonic diverticula. No inflammatory changes. No significant stool burden. Vascular/Lymphatic: Atherosclerotic calcifications of the abdominal aorta. Similar diameter of the infrarenal abdominal aorta with similar course caliber and contour. Greatest diameter measures 28 mm, similar to the comparison. Atherosclerotic changes of the bilateral iliac arteries. No lymphadenopathy. Reproductive: Enlarged prostate with the diameter on axial images measuring 6.7 cm x 8.2 cm. Impression on the bladder base from the median lobe. Other: Small umbilical hernia which again appears to have a knuckle of small bowel entering the hernia sac. No evidence of associated obstruction. Supraumbilical ventral wall hernia again  noted containing short segment of transverse colon. No significant inflammatory changes or evidence of obstruction. Bilateral fat containing inguinal hernia with trace fluid on the left. Musculoskeletal: No acute displaced fracture. Multilevel degenerative changes of the lumbar spine with multiple Schmorl's nodes. Vacuum disc phenomenon present at all levels except for L4-L5. No bony canal narrowing with posterior disc bulges at all levels. Degenerative changes of the bilateral hips. IMPRESSION: CT is negative for nephrolithiasis or hydronephrosis. No acute CT finding identified that would account for flank pain. 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 Urology referral is indicated if not previously completed. Redemonstration of additional bilateral renal lesions, incompletely characterized on the current CT. This includes a complex lesion at the inferior right kidney which may represent a partially cystic additional renal cell carcinoma. Again, urology referral is indicated. Unchanged appearance of a supraumbilical ventral hernia containing transverse colon. Unchanged appearance of umbilical hernia which contains short segment of small bowel, without obstruction. Prostatomegaly. Aortic Atherosclerosis (ICD10-I70.0). Unchanged diameter of the infrarenal abdominal aorta measuring 2.8 cm. Ectatic abdominal aorta at risk for aneurysm development. Recommend followup by ultrasound in 5 years. This recommendation follows ACR consensus guidelines: White Paper of the ACR Incidental Findings Committee II on Vascular Findings. J Am Coll Radiol 2013; 10:789-794. Additional ancillary findings as above. Electronically Signed   By: Corrie Mckusick D.O.   On: 01/22/2020 12:38     Phillips Climes M.D on 02/07/2020 at 10:46 AM    [facility] -  Office  [phone]

## 2020-02-07 NOTE — Progress Notes (Signed)
This note also relates to the following rows which could not be included: Pulse Rate - Cannot attach notes to unvalidated device data SpO2 - Cannot attach notes to unvalidated device data    02/06/20 2322  Hand-Off documentation  Handoff Given Given to shift RN/LPN  Report given to (Full Name) Lennie Odor, RN  Handoff Received Received from shift RN/LPN  Report received from (Full Name) Cosimo Schertzer, RN  Vital Signs  Temp 98.6 F (37 C)  Temp Source Oral  Resp 18  BP (!) 161/80  BP Location Right Arm  BP Method Automatic  Patient Position (if appropriate) Lying  Oxygen Therapy  O2 Device Room Air  Pain Assessment  Pain Scale 0-10  Pain Score 0  Dialysis Weight  Weight 93.5 kg  Type of Weight Post-Dialysis  Pre Treatment Patient Checks  Vascular access used during treatment Catheter  HD catheter dressing before treatment WDL  Post-Hemodialysis Assessment  Rinseback Volume (mL) 250 mL  KECN 274 V  Dialyzer Clearance Lightly streaked  Duration of HD Treatment -hour(s) 3.5 hour(s)  Hemodialysis Intake (mL) 800 mL  UF Total -Machine (mL) 3818 mL  Net UF (mL) 3018 mL  Tolerated HD Treatment Yes  Post-Hemodialysis Comments tx achieved as ecpectec, pt is stabe, no complaintd  AVG/AVF Arterial Site Held (minutes) 0 minutes  AVG/AVF Venous Site Held (minutes) 0 minutes  Education / Care Plan  Dialysis Education Provided Yes  Documented Education in Care Plan Yes  Outpatient Plan of Care Reviewed and on Chart Other (Comment)  Hemodialysis Catheter Right Internal jugular Double lumen Permanent (Tunneled)  Placement Date/Time: 01/24/20 1649   Placed prior to admission: No  Time Out: Correct patient;Correct procedure;Correct site  Maximum sterile barrier precautions: Hand hygiene;Cap;Mask;Sterile gown;Large sterile sheet;Sterile gloves  Site Prep: Chlorh...  Site Condition No complications  Catheter fill solution Heparin 1000 units/ml  Catheter fill volume (Arterial) 1.9 cc   Catheter fill volume (Venous) 1.9  Dressing Type Occlusive  Dressing Status Clean;Dry;Intact

## 2020-02-07 NOTE — Progress Notes (Signed)
[facility] Progress Note  Subjective:  Feeling better after blood transfusion.  Tolerated HD last PM.  No new issues.     Vitals:   02/06/20 2350 02/07/20 0000 02/07/20 0340 02/07/20 0948  BP:  (!) 157/84 (!) 157/90 124/78  Pulse: 71 64 70 84  Resp:  20 20   Temp:   98.3 F (36.8 C)   TempSrc:  Oral Oral   SpO2:  96% 93%   Weight:      Height:        Exam: Gen: comfortable in chair Extr: LUE AVF +t/b, trace tibial edema CV: RRR Lungs: normal WOB on RA Neuro: conversant    Home meds:  - norvasc 10/ lipitor 40/ metoprolol 12.5 bid  - flomax 0.4/ norco qid prn  - prn's/ vitamins/ supplements     Na 137 BUN 38 Cr 9.57   CO2 22    CXR - bilat small infiltrates    OP HD: TTS [facility]  4h 15min  88kg just raised  LUA AVF/ TDC  P4  2/2 bath Hep none (^IDWG ~5kg)  - no ESA, VDRA or Fe   Assessment/ Plan: 1. Gen weakness - multifact w/ COVID+ infection/ pna, also anemia w/ Hb 6.2 on admission. Is now SP 2u prbc. And is getting IV remdesivir / decadron as per COVID protocol.  Feeling improved. 2. Anemia ckd/ malignancy - sp 2u prbc yest am, Hb stable at 7.9. Not on ESA in light of RCCa.  3. ESRD - recent onset, due to myeloma, on HD < 1 mo. HD bumped from Sat to Sunday, next will be Tuesday 4. BP/volume - cont BP meds. tol UF 3L yesterday 5. 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 Multiple myeloma - seen by [facility], see their note 7. BPH - on flomax   Lindsay Kruska MD [facility] Pager [phone]   Recent Labs  Lab 02/04/20 1930 02/04/20 1930 02/05/20 1218 02/06/20 0201  K 4.6  --  4.9  --   BUN 38*  --  46*  --   CREATININE 9.57*  --  10.45*  --   CALCIUM 8.7*  --  8.8*  --   HGB 6.2*   < > 7.9* 7.9*   < > = values in this interval not displayed.   Inpatient medications: . amLODipine  10 mg Oral Daily  . atorvastatin  40 mg Oral Daily  . Chlorhexidine Gluconate Cloth  6 each Topical Q0600  .  dexamethasone (DECADRON) injection  6 mg Intravenous Q24H  . feeding supplement (NEPRO CARB STEADY)  237 mL Oral BID BM  . heparin  5,000 Units Subcutaneous Q8H  . insulin aspart  0-5 Units Subcutaneous QHS  . insulin aspart  0-6 Units Subcutaneous TID WC  . metoprolol tartrate  12.5 mg Oral BID  . multivitamin  1 tablet Oral QHS  . pantoprazole  40 mg Oral Daily  . tamsulosin  0.4 mg Oral Daily   . remdesivir 100 mg in NS 100 mL 100 mg (02/07/20 0953)   acetaminophen **OR** acetaminophen, ALPRAZolam, melatonin       

## 2020-02-08 LAB — CBC
HCT: 27.6 % — ABNORMAL LOW (ref 39.0–52.0)
Hemoglobin: 9 g/dL — ABNORMAL LOW (ref 13.0–17.0)
MCH: 30.7 pg (ref 26.0–34.0)
MCHC: 32.6 g/dL (ref 30.0–36.0)
MCV: 94.2 fL (ref 80.0–100.0)
Platelets: 206 10*3/uL (ref 150–400)
RBC: 2.93 MIL/uL — ABNORMAL LOW (ref 4.22–5.81)
RDW: 15.2 % (ref 11.5–15.5)
WBC: 6.4 10*3/uL (ref 4.0–10.5)
nRBC: 0 % (ref 0.0–0.2)

## 2020-02-08 LAB — GLUCOSE, CAPILLARY
Glucose-Capillary: 82 mg/dL (ref 70–99)
Glucose-Capillary: 80 mg/dL (ref 70–99)
Glucose-Capillary: 112 mg/dL — ABNORMAL HIGH (ref 70–99)
Glucose-Capillary: 96 mg/dL (ref 70–99)

## 2020-02-08 LAB — C-REACTIVE PROTEIN: CRP: 1.4 mg/dL — ABNORMAL HIGH (ref <1.0)

## 2020-02-08 MED ORDER — ACETAMINOPHEN 325 MG PO TABS
ORAL_TABLET | ORAL | Status: AC
  Filled 2020-02-08: qty 2

## 2020-02-08 MED ORDER — HEPARIN SODIUM (PORCINE) 1000 UNIT/ML IJ SOLN
INTRAMUSCULAR | Status: AC
  Administered 2020-02-08: 3000 [IU]
  Filled 2020-02-08: qty 6

## 2020-02-08 NOTE — Progress Notes (Signed)
   02/08/20 1000  Clinical Encounter Type  Visited With Health care provider  Visit Type Follow-up  Referral From Nurse  Consult/Referral To Chaplain   Chaplain unable to visit due to pt not available. Chaplain will check on pt tomorrow. Chaplains available via pager if needs arise.   Chaplain resident, Evelene Croon, [place] [phone] on-call pager

## 2020-02-08 NOTE — Progress Notes (Signed)
Renal Navigator notified OP HD clinic/[facility] of plan for discharge tomorrow, 02/09/20 and, therefore, return to clinic for next treatment on Thursday, 02/10/20 3rd isolation shift, 5:30pm. [name] is prepared for this also.  Alphonzo Cruise, [place] Renal Navigator [phone]

## 2020-02-08 NOTE — Progress Notes (Signed)
PROGRESS NOTE                                                                                                                                                                                                             Patient Demographics:    [name] [name], is a 74 y.o. male, DOB - 1946-01-20, YSA:[id number]  Admit date - 02/04/2020   Admitting Physician Shela Leff, MD  Outpatient Primary MD for the patient is Nolene Ebbs, MD  LOS - 3   No chief complaint on file.      Brief Narrative    [name] [name] is a 74 y.o. male with medical history significant of multiple myeloma, renal mass, anemia, paroxysmal A. fib, CAD, ESRD recently started on HD, chronic diastolic CHF, COPD, diet-controlled type 2 diabetes, hypertension, hyperlipidemia, BPH presented to the ED via EMS for evaluation of generalized weakness.  Patient states he has been feeling weak for the past 3 weeks. He went to see his oncologist and was told treatment could not be started for his multiple myeloma as his hemoglobin was too low.  He came into the hospital to get blood transfusions.  Denies hematemesis, hematochezia, or melena.  Denies dizziness/lightheadedness, chest pain, shortness of breath, cough, fevers, chills, nausea, vomiting, abdominal pain, or diarrhea.  Hemoglobin 6.2, previously in the 7-8 range.  FOBT negative.  Potassium 4.6, bicarb 28.  UA pending.  Screening SARS-CoV-2 PCR test positive.  As well his chest x-ray of COVID-19 pneumonia, he had Covid last December, and in between he had negative COVID-19 test, so patient was treated for COVID-19 pneumonia this hospital stay.    Subjective:    Diane Stitely today feeling better today, no dyspnea, cough has resolved, weakness significantly improved .   Assessment  & Plan :    Principal Problem:   Symptomatic anemia Active Problems:   Essential (primary) hypertension   BPH (benign prostatic hyperplasia)   Diabetes (HCC)  ESRD (end stage renal disease) (HCC)    Acute on chronic symptomatic anemia/anemia of chronic kidney disease. -No evidence or signs of GI bleed, anemia in the setting of advanced kidney disease, ESRD patient, for myeloma .IV iron and Procrit per nephrology. -Transfused 2 unit PRBC, hemoglobin remains low at 7.9, he was transfused another unit 6/6 so he can keep his hemoglobin at least above 8,  so he can start his therapy for his multiple myeloma by his oncologist .  [name] CBC this am still pending. -Patient is not endorsing any symptoms of GI bleed.  FOBT negative.  Hemoglobin 6.2, previously in the 7-8 range.  Hemodynamically stable.  COVID-19 pneumonia: -Chest x-ray is concerning for multifocal pneumonia due to COVID-19 infection, and the patient with no hypoxia, but he is significantly immunocompromise, with multiple myeloma, renal malignancy and immunocompromised, so we will go ahead and continue with IV Decadron and remdesivir, dose of remdesivir tomorrow, and then to be discharged on oral Decadron. -follow Inflammatory markers -Patient did not receive COVID  vaccine. -Patient had previous episodes of COVID-19 infection in December 2020. COVID-19 Labs  Recent Labs    02/05/20 1218 02/05/20 1218 02/06/20 0201 02/07/20 1113 02/08/20 0438  DDIMER 4.50*  --   --   --   --   FERRITIN 894*  --   --   --   --   CRP 2.6*   < > 2.2* 2.2* 1.4*   < > = values in this interval not displayed.    Lab Results  Component Value Date   [place] (A) 02/04/2020   [place] NEGATIVE 01/22/2020   SARSCOV2NAA DETECTED (A) 08/23/2019   [place] NEGATIVE 06/26/2019     ESRD on HD TTS: -Renal consulted.  Multiple myeloma:  - Seen by oncology on 5/24 and due to his poor performance status and noncompliance he is felt to be a poor candidate for systemic myeloma treatment but if he is agreeable/tolerant with ongoing HD and has a clear plan to address his renal mass, oncology would  consider restarting systemic myeloma treatment as outpatient. -We will follow up with [facility] oncology after finishing his discharge isolation.  History of left renal mass:  -Highly suspicious for renal cell carcinoma and patient has declined therapy in the past. -Discussed these findings with patient, he is aware of it, and reports he will follow with urology as an outpatient. -Have discussed these findings both patient, and son, and both are aware of it, and the son reports his dad has declined therapy/kidney resection surgery .  Diet-controlled type 2 diabetes -Sliding scale insulin very sensitive ACHS and CBG checks.  Hypertension: Stable. -Continue home amlodipine and metoprolol.  Hyperlipidemia -Continue home Lipitor  BPH -Continue Flomax   COVID-19 Labs  Recent Labs    02/05/20 1218 02/05/20 1218 02/06/20 0201 02/07/20 1113 02/08/20 0438  DDIMER 4.50*  --   --   --   --   FERRITIN 894*  --   --   --   --   CRP 2.6*   < > 2.2* 2.2* 1.4*   < > = values in this interval not displayed.    Lab Results  Component Value Date   SARSCOV2NAA POSITIVE (A) 02/04/2020   [place] NEGATIVE 01/22/2020   SARSCOV2NAA DETECTED (A) 08/23/2019   [place] NEGATIVE 06/26/2019     Code Status : Full  Family Communication  : D/W son [name] via phone 6/6  Disposition Plan  :  Status is: inpatient  The patient will require care spanning > 2 midnights and should be moved to inpatient because: IV treatments appropriate due to intensity of illness or inability to take PO  Dispo: The patient is from: Home              Anticipated d/c is to: Home              Anticipated d/c date  is: 1 day              Patient currently is not medically stable to d/c.  Discharge tomorrow on oral Decadron after finishing IV remdesivir.    Consults  :  renal  Procedures  : None  DVT Prophylaxis  : [place] heparin  Lab Results  Component Value Date   PLT 206 02/08/2020    Antibiotics   :    Anti-infectives (From admission, onward)   Start     Dose/Rate Route Frequency Ordered Stop   02/06/20 1000  remdesivir 100 mg in sodium chloride 0.9 % 100 mL IVPB     100 mg 200 mL/hr over 30 Minutes Intravenous Daily 02/05/20 0511 02/10/20 0959   02/05/20 0600  remdesivir 200 mg in sodium chloride 0.9% 250 mL IVPB     200 mg 580 mL/hr over 30 Minutes Intravenous Once 02/05/20 0511 02/05/20 1057        Objective:   Vitals:   02/08/20 0814 02/08/20 0830 02/08/20 0900 02/08/20 0930  BP: (!) 150/88 (!) 148/99 135/84 (P) 121/79  Pulse: 64 66 (!) 57 (P) 70  Resp: 18 18 18  (P) 18  Temp:      TempSrc:      SpO2:      Weight:      Height:        Wt Readings from Last 3 Encounters:  02/08/20 93.6 kg  01/27/20 88.8 kg  01/12/20 90.3 kg     Intake/Output Summary (Last 24 hours) at 02/08/2020 1011 Last data filed at 02/07/2020 1856 Gross per 24 hour  Intake 360 ml  Output --  Net 360 ml     Physical Exam  Awake Alert, Oriented X 3, No new F.N deficits, Normal affect Symmetrical Chest wall movement, Good air movement bilaterally, CTAB RRR,No Gallops,Rubs or new Murmurs, No Parasternal Heave +ve B.Sounds, Abd Soft, No tenderness, line abdominal wall hernia, reducible, nontender no rebound - guarding or rigidity. No Cyanosis, Clubbing or edema, No new Rash or bruise       Data Review:    CBC Recent Labs  Lab 02/04/20 1930 02/05/20 1218 02/06/20 0201 02/07/20 1113 02/08/20 0438  WBC 5.5 6.3 7.3 5.8 6.4  HGB 6.2* 7.9* 7.9* 9.2* 9.0*  HCT 19.9* 23.9* 24.1* 28.3* 27.6*  PLT 170 195 191 218 206  MCV 101.5* 96.8 95.6 95.6 94.2  MCH 31.6 32.0 31.3 31.1 30.7  MCHC 31.2 33.1 32.8 32.5 32.6  RDW 15.0 15.7* 15.2 15.9* 15.2  LYMPHSABS 1.0  --   --   --   --   MONOABS 0.7  --   --   --   --   EOSABS 0.1  --   --   --   --   BASOSABS 0.0  --   --   --   --     Chemistries  Recent Labs  Lab 02/04/20 1930 02/05/20 1218  NA 137 136  K 4.6 4.9  CL 97* 96*  CO2  28 27  GLUCOSE 81 134*  BUN 38* 46*  CREATININE 9.57* 10.45*  CALCIUM 8.7* 8.8*   ------------------------------------------------------------------------------------------------------------------ No results for input(s): CHOL, HDL, LDLCALC, TRIG, CHOLHDL, LDLDIRECT in the last 72 hours.  Lab Results  Component Value Date   HGBA1C 5.2 06/26/2019   ------------------------------------------------------------------------------------------------------------------ No results for input(s): TSH, T4TOTAL, T3FREE, THYROIDAB in the last 72 hours.  Invalid input(s): FREET3 ------------------------------------------------------------------------------------------------------------------ Recent Labs    02/05/20 1218  FERRITIN 894*  Coagulation profile No results for input(s): INR, PROTIME in the last 168 hours.  Recent Labs    02/05/20 1218  DDIMER 4.50*    Cardiac Enzymes No results for input(s): CKMB, TROPONINI, MYOGLOBIN in the last 168 hours.  Invalid input(s): CK ------------------------------------------------------------------------------------------------------------------    Component Value Date/Time   BNP 36.2 09/09/2018 1834    Inpatient Medications  Scheduled Meds: . acetaminophen      . amLODipine  10 mg Oral Daily  . atorvastatin  40 mg Oral Daily  . Chlorhexidine Gluconate Cloth  6 each Topical Q0600  . dexamethasone (DECADRON) injection  6 mg Intravenous Q24H  . feeding supplement (NEPRO CARB STEADY)  237 mL Oral BID BM  . heparin  5,000 Units Subcutaneous Q8H  . heparin sodium (porcine)      . insulin aspart  0-5 Units Subcutaneous QHS  . insulin aspart  0-6 Units Subcutaneous TID WC  . metoprolol tartrate  12.5 mg Oral BID  . multivitamin  1 tablet Oral QHS  . pantoprazole  40 mg Oral Daily  . tamsulosin  0.4 mg Oral Daily   Continuous Infusions: . remdesivir 100 mg in NS 100 mL 100 mg (02/07/20 0953)   PRN Meds:.acetaminophen **OR**  acetaminophen, ALPRAZolam, melatonin  Micro Results Recent Results (from the past 240 hour(s))  SARS Coronavirus 2 by RT PCR (hospital order, performed in [place] hospital lab) Nasopharyngeal Nasopharyngeal Swab     Status: Abnormal   Collection Time: 02/04/20  8:35 PM   Specimen: Nasopharyngeal Swab  Result Value Ref Range Status   SARS Coronavirus 2 POSITIVE (A) NEGATIVE Final    Comment: RESULT CALLED TO, READ BACK BY AND VERIFIED WITH: A CAIN RN 2155 02/04/20 A BROWNING (NOTE) SARS-CoV-2 target nucleic acids are DETECTED SARS-CoV-2 RNA is generally detectable in upper respiratory specimens  during the acute phase of infection.  Positive results are indicative  of the presence of the identified virus, but do not rule out bacterial infection or co-infection with other pathogens not detected by the test.  Clinical correlation with patient history and  other diagnostic information is necessary to determine patient infection status.  The expected result is negative. Fact Sheet for Patients:   StrictlyIdeas.no  Fact Sheet for Healthcare Providers:   BankingDealers.co.za   This test is not yet approved or cleared by the [place] FDA and  has been authorized for detection and/or diagnosis of SARS-CoV-2 by FDA under an Emergency Use Authorization (EUA).  This EUA will remain in effect (meaning this test can be  used) for the duration of  the COVID-19 declaration under Section 564(b)(1) of the Act, 21 U.S.C. section 360-bbb-3(b)(1), unless the authorization is terminated or revoked sooner. Performed at [facility], [place] [address], [place], [place] [address]   MRSA PCR Screening     Status: None   Collection Time: 02/05/20  8:54 PM   Specimen: Nasal Mucosa; Nasopharyngeal  Result Value Ref Range Status   MRSA by PCR NEGATIVE NEGATIVE Final    Comment:        The GeneXpert MRSA Assay (FDA approved for NASAL specimens only),  is one component of a comprehensive MRSA colonization surveillance program. It is not intended to diagnose MRSA infection nor to guide or monitor treatment for MRSA infections. Performed at [facility], [place] [address], [place], [place] [address]     Radiology Reports IR Fluoro Guide CV Line Right  Result Date: 01/24/2020 INDICATION: 74 year old male referred for tunneled  hemodialysis catheter EXAM: TUNNELED CENTRAL VENOUS HEMODIALYSIS CATHETER PLACEMENT WITH ULTRASOUND AND FLUOROSCOPIC GUIDANCE MEDICATIONS: 1 g vancomycin. The antibiotic was given in an appropriate time interval prior to skin puncture. ANESTHESIA/SEDATION: Moderate (conscious) sedation was employed during this procedure. A total of Versed 1.0 mg and Fentanyl 25 mcg was administered intravenously. Moderate Sedation Time: 14 minutes. The patient's level of consciousness and vital signs were monitored continuously by radiology nursing throughout the procedure under my direct supervision. FLUOROSCOPY TIME:  Fluoroscopy Time: 0 minutes 48 seconds (4 mGy). COMPLICATIONS: None PROCEDURE: Informed written consent was obtained from the patient after a discussion of the risks, benefits, and alternatives to treatment. Questions regarding the procedure were encouraged and answered. The right neck and chest were prepped with chlorhexidine in a sterile fashion, and a sterile drape was applied covering the operative field. Maximum barrier sterile technique with sterile gowns and gloves were used for the procedure. A timeout was performed prior to the initiation of the procedure. Ultrasound survey was performed. Micropuncture kit was utilized to access the right internal jugular vein under direct, real-time ultrasound guidance after the overlying soft tissues were anesthetized with 1% lidocaine with epinephrine. Stab incision was made with 11 blade scalpel. Microwire was passed centrally. The microwire was then marked to measure appropriate  internal catheter length. External tunneled length was estimated. A total tip to cuff length of 23 cm was selected. 035 guidewire was advanced to the level of the IVC. Skin and subcutaneous tissues of chest wall below the clavicle were generously infiltrated with 1% lidocaine for local anesthesia. A small stab incision was made with 11 blade scalpel. The selected hemodialysis catheter was tunneled in a retrograde fashion from the anterior chest wall to the venotomy incision. Serial dilation was performed and then a peel-away sheath was placed. The catheter was then placed through the peel-away sheath with tips ultimately positioned within the superior aspect of the right atrium. Final catheter positioning was confirmed and documented with a spot radiographic image. The catheter aspirates and flushes normally. The catheter was flushed with appropriate volume heparin dwells. The catheter exit site was secured with a 0-Prolene retention suture. Gel-Foam slurry was infused into the soft tissue tract. The venotomy incision was closed Derma bond and sterile dressing. Dressings were applied at the chest wall. Patient tolerated the procedure well and remained hemodynamically stable throughout. No complications were encountered and no significant blood loss encountered. IMPRESSION: Status post right IJ tunneled hemodialysis catheter. Signed, Dulcy Fanny. Dellia Nims, RPVI [facility] Radiology Electronically Signed   By: Corrie Mckusick D.O.   On: 01/24/2020 17:27   IR US Guide Vasc Access Right  Result Date: 01/24/2020 INDICATION: 74 year old male referred for tunneled hemodialysis catheter EXAM: TUNNELED CENTRAL VENOUS HEMODIALYSIS CATHETER PLACEMENT WITH ULTRASOUND AND FLUOROSCOPIC GUIDANCE MEDICATIONS: 1 g vancomycin. The antibiotic was given in an appropriate time interval prior to skin puncture. ANESTHESIA/SEDATION: Moderate (conscious) sedation was employed during this  procedure. A total of Versed 1.0 mg and Fentanyl 25 mcg was administered intravenously. Moderate Sedation Time: 14 minutes. The patient's level of consciousness and vital signs were monitored continuously by radiology nursing throughout the procedure under my direct supervision. FLUOROSCOPY TIME:  Fluoroscopy Time: 0 minutes 48 seconds (4 mGy). COMPLICATIONS: None PROCEDURE: Informed written consent was obtained from the patient after a discussion of the risks, benefits, and alternatives to treatment. Questions regarding the procedure were encouraged and answered. The right neck and chest were prepped with chlorhexidine in a sterile  fashion, and a sterile drape was applied covering the operative field. Maximum barrier sterile technique with sterile gowns and gloves were used for the procedure. A timeout was performed prior to the initiation of the procedure. Ultrasound survey was performed. Micropuncture kit was utilized to access the right internal jugular vein under direct, real-time ultrasound guidance after the overlying soft tissues were anesthetized with 1% lidocaine with epinephrine. Stab incision was made with 11 blade scalpel. Microwire was passed centrally. The microwire was then marked to measure appropriate internal catheter length. External tunneled length was estimated. A total tip to cuff length of 23 cm was selected. 035 guidewire was advanced to the level of the IVC. Skin and subcutaneous tissues of chest wall below the clavicle were generously infiltrated with 1% lidocaine for local anesthesia. A small stab incision was made with 11 blade scalpel. The selected hemodialysis catheter was tunneled in a retrograde fashion from the anterior chest wall to the venotomy incision. Serial dilation was performed and then a peel-away sheath was placed. The catheter was then placed through the peel-away sheath with tips ultimately positioned within the superior aspect of the right atrium. Final catheter  positioning was confirmed and documented with a spot radiographic image. The catheter aspirates and flushes normally. The catheter was flushed with appropriate volume heparin dwells. The catheter exit site was secured with a 0-Prolene retention suture. Gel-Foam slurry was infused into the soft tissue tract. The venotomy incision was closed Derma bond and sterile dressing. Dressings were applied at the chest wall. Patient tolerated the procedure well and remained hemodynamically stable throughout. No complications were encountered and no significant blood loss encountered. IMPRESSION: Status post right IJ tunneled hemodialysis catheter. Signed, Dulcy Fanny. Dellia Nims, RPVI [facility] Radiology Electronically Signed   By: Corrie Mckusick D.O.   On: 01/24/2020 17:27   DG CHEST PORT 1 VIEW  Result Date: 02/05/2020 CLINICAL DATA:  COVID-19, new onset dialysis EXAM: PORTABLE CHEST 1 VIEW COMPARISON:  01/22/2020 FINDINGS: Mild subpleural opacities in the right upper lobe and left upper lobe/lingula. Mild retrocardiac opacity. These findings favor multifocal pneumonia in this patient with known COVID. No pleural effusion or pneumothorax. Cardiomegaly. Right IJ dual lumen dialysis catheter terminating in the upper right atrium. IMPRESSION: Multifocal pneumonia in this patient with known COVID. Electronically Signed   By: Julian Hy M.D.   On: 02/05/2020 00:57   DG CHEST PORT 1 VIEW  Result Date: 01/22/2020 CLINICAL DATA:  Shortness of breath EXAM: PORTABLE CHEST 1 VIEW COMPARISON:  11/30/2018 FINDINGS: Cardiomegaly and aortic tortuosity. There is no edema, consolidation, effusion, or pneumothorax. Hazy density at the right apex is chronic and attributed to ectatic vessels based on 2018 chest CT. IMPRESSION: Cardiomegaly without failure. Electronically Signed   By: Monte Fantasia M.D.   On: 01/22/2020 07:06   CT RENAL STONE STUDY  Result Date: 01/22/2020 CLINICAL  DATA:  74 year old male with a history of flank pain EXAM: CT ABDOMEN AND PELVIS WITHOUT CONTRAST TECHNIQUE: Multidetector CT imaging of the abdomen and pelvis was performed following the standard protocol without IV contrast. COMPARISON:  Ultrasound 06/26/2019, CT 06/01/2019 FINDINGS: Lower chest: No acute finding of the lower chest. Hepatobiliary: Similar appearance of liver with small right-sided hypodense focus measuring 11 mm and additional small subcentimeter hypodense foci within the right liver. Gallbladder decompressed. Pancreas: Unremarkable Spleen: Unremarkable Adrenals/Urinary Tract: - Right adrenal gland:  Unremarkable - Left adrenal gland: Unremarkable. - Right kidney: Similar appearance of the right kidney with  multiple rounded lesions some of which are hyperdense and some of which are low-density. Complex lesion at the inferior renal cortex on the right estimated 4.7 cm x 4.5 cm, similar to the comparison. No evidence of hydronephrosis or nephrolithiasis. Unremarkable course of the right ureter. - Left Kidney: No evidence of left-sided hydronephrosis. Unremarkable course of the left ureter. Redemonstration of multiple low-density and high density lesions of left kidney. 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 The axial dimensions on the prior CT measured 7.2 cm x 6.2 cm while the axial dimensions on the current CT are estimated 7.5 cm x 8.7 cm - Urinary Bladder: Urinary bladder is relatively unremarkable partially distended. Impression on the bladder base secondary to the median lobe of the prostate. Stomach/Bowel: - Stomach: Unremarkable. - Small bowel: Unremarkable - Appendix: Appendix is not visualized, however, no inflammatory changes are present adjacent to the cecum to indicate an appendicitis. - Colon: Surgical changes in the region of the cecum.  Redemonstration of ventral hernia containing segment of transverse colon. No evidence of obstruction. Colonic diverticula. No inflammatory changes. No significant stool burden. Vascular/Lymphatic: Atherosclerotic calcifications of the abdominal aorta. Similar diameter of the infrarenal abdominal aorta with similar course caliber and contour. Greatest diameter measures 28 mm, similar to the comparison. Atherosclerotic changes of the bilateral iliac arteries. No lymphadenopathy. Reproductive: Enlarged prostate with the diameter on axial images measuring 6.7 cm x 8.2 cm. Impression on the bladder base from the median lobe. Other: Small umbilical hernia which again appears to have a knuckle of small bowel entering the hernia sac. No evidence of associated obstruction. Supraumbilical ventral wall hernia again noted containing short segment of transverse colon. No significant inflammatory changes or evidence of obstruction. Bilateral fat containing inguinal hernia with trace fluid on the left. Musculoskeletal: No acute displaced fracture. Multilevel degenerative changes of the lumbar spine with multiple Schmorl's nodes. Vacuum disc phenomenon present at all levels except for L4-L5. No bony canal narrowing with posterior disc bulges at all levels. Degenerative changes of the bilateral hips. IMPRESSION: CT is negative for nephrolithiasis or hydronephrosis. No acute CT finding identified that would account for flank pain. 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 Urology referral is indicated if not previously completed. Redemonstration of additional bilateral renal lesions, incompletely characterized on the current CT. This includes a complex lesion at the inferior right kidney which may represent a partially cystic additional renal cell carcinoma. Again, urology referral is indicated.  Unchanged appearance of a supraumbilical ventral hernia containing transverse colon. Unchanged appearance of umbilical hernia which contains short segment of small bowel, without obstruction. Prostatomegaly. Aortic Atherosclerosis (ICD10-I70.0). Unchanged diameter of the infrarenal abdominal aorta measuring 2.8 cm. Ectatic abdominal aorta at risk for aneurysm development. Recommend followup by ultrasound in 5 years. This recommendation follows ACR consensus guidelines: White Paper of the ACR Incidental Findings Committee II on Vascular Findings. J Am Coll Radiol 2013; 10:789-794. Additional ancillary findings as above. Electronically Signed   By: Corrie Mckusick D.O.   On: 01/22/2020 12:38     Phillips Climes M.D on 02/08/2020 at 10:11 AM    [facility] -  Office  [phone]

## 2020-02-08 NOTE — Evaluation (Signed)
Physical Therapy Evaluation/Discharge Patient Details Name: [name] [name] MRN: [id number] DOB: 1945/12/19 Today's Date: 02/08/2020   History of Present Illness  74 y.o. male admitted on 02/04/20 for generalized weakness.  Pt dx with symptomatic anemia thought to be due to ESRD and multiple myeloma.  Pt s/p multiple units of PRBCs.  Coincidental COVID 19 (+) test.  Pt with significant PMH of venous insufficiency, spinal stenosis in the cervical region, renal cell carcinoma, PAF, moderate aortic insufficency, hypertensive heart disease, CODP, DM2, asthma, CAD, Ascending aortic aneurysm,  lumbar disc surgery.    Clinical Impression  Pt appears to be at his mobility baseline.  O2 sats are in the 90s while walking and talking during gait in the hallway.  He has no current or follow up PT needs at this time.  PT to sign off.     Follow Up Recommendations No PT follow up    Equipment Recommendations  None recommended by PT    Recommendations for Other Services   NA    Precautions / Restrictions Precautions Precautions: None Restrictions Weight Bearing Restrictions: No      Mobility  Bed Mobility               General bed mobility comments: Pt was OOB in the recliner chair.   Transfers Overall transfer level: Modified independent               General transfer comment: A little extra time and energy needed and reliance on hands for transitions, but did so safely and without help.   Ambulation/Gait Ambulation/Gait assistance: Supervision Gait Distance (Feet): 150 Feet Assistive device: None Gait Pattern/deviations: Staggering left;Staggering right     General Gait Details: Pt with midly staggering gait pattern, able to self correct, walking and talking throughout.  O2 sats on RA in the 90s throughout.   Stairs            Wheelchair Mobility    Modified Rankin (Stroke Patients Only)       Balance Overall balance assessment: Mild deficits observed, not formally  tested                                           Pertinent Vitals/Pain Pain Assessment: No/denies pain    Home Living Family/patient expects to be discharged to:: Private residence Living Arrangements: Alone Available Help at Discharge: Family;Available PRN/intermittently Type of Home: Apartment Home Access: Level entry     Home Layout: One level Home Equipment: None Additional Comments: Pt's daughter is making an apartment for him in her new house which is currently under Architect.      Prior Function Level of Independence: Independent         Comments: drives     Hand Dominance   Dominant Hand: Right    Extremity/Trunk Assessment   Upper Extremity Assessment Upper Extremity Assessment: Overall WFL for tasks assessed    Lower Extremity Assessment Lower Extremity Assessment: Overall WFL for tasks assessed    Cervical / Trunk Assessment Cervical / Trunk Assessment: Other exceptions Cervical / Trunk Exceptions: h/o lumbar spine surgery and cervical degenrative disease  Communication   Communication: No difficulties  Cognition Arousal/Alertness: Awake/alert Behavior During Therapy: WFL for tasks assessed/performed Overall Cognitive Status: No family/caregiver present to determine baseline cognitive functioning  General Comments: Pt is generally alert and oriented, but talks about being followed by people who are taking his picture.  He relays they are police types and they have been following him and tailing him for years.       General Comments      Exercises     Assessment/Plan    PT Assessment Patent does not need any further PT services  PT Problem List         PT Treatment Interventions      PT Goals (Current goals can be found in the Care Plan section)  Acute Rehab PT Goals Patient Stated Goal: to go home PT Goal Formulation: All assessment and education complete, DC  therapy    Frequency     Barriers to discharge        Co-evaluation               AM-PAC PT "6 Clicks" Mobility  Outcome Measure Help needed turning from your back to your side while in a flat bed without using bedrails?: None Help needed moving from lying on your back to sitting on the side of a flat bed without using bedrails?: None Help needed moving to and from a bed to a chair (including a wheelchair)?: None Help needed standing up from a chair using your arms (e.g., wheelchair or bedside chair)?: None Help needed to walk in hospital room?: None Help needed climbing 3-5 steps with a railing? : A Little 6 Click Score: 23    End of Session   Activity Tolerance: Patient tolerated treatment well Patient left: in chair;with call bell/phone within reach   PT Visit Diagnosis: Muscle weakness (generalized) (M62.81)    Time: 7588-3254 PT Time Calculation (min) (ACUTE ONLY): 25 min   Charges:   PT Evaluation $PT Eval Moderate Complexity: 1 Mod PT Treatments $Gait Training: 8-22 mins       Verdene Lennert, PT, DPT  Acute Rehabilitation #[phone] pager #[phone] office     02/08/2020, 8:32 PM

## 2020-02-08 NOTE — Progress Notes (Signed)
OT Cancellation Note  Patient Details Name: [name] [name] MRN: [id number] DOB: Mar 22, 1946   Cancelled Treatment:    Reason Eval/Treat Not Completed: Patient at procedure or test/ unavailable. Patient off floor at HD. Will follow up when time allows.  August Luz, OTR/L   Phylliss Bob 02/08/2020, 9:27 AM

## 2020-02-08 NOTE — Progress Notes (Signed)
[facility] Progress Note  Subjective: Patient not examined today directly given COVID-19 + status, utilizing data taken from chart +/- discussions w/ providers and staff.  Tolerated HD today without issue.      Vitals:   02/08/20 1030 02/08/20 1100 02/08/20 1141 02/08/20 1454  BP: (!) 118/92 95/60 132/89 119/70  Pulse: 66 (!) 59 75 68  Resp: 18 18 19 18   Temp:   (!) 97.5 F (36.4 C) 97.7 F (36.5 C)  TempSrc:   Oral Oral  SpO2:   96% 90%  Weight:      Height:        Exam:  Patient not examined today directly given COVID-19 + status, utilizing data taken from chart +/- discussions w/ providers and staff.     Home meds:  - norvasc 10/ lipitor 40/ metoprolol 12.5 bid  - flomax 0.4/ norco qid prn  - prn's/ vitamins/ supplements     Na 137 BUN 38 Cr 9.57   CO2 22    CXR - bilat small infiltrates    OP HD: TTS [facility]  4h 59mn  88kg just raised  LUA AVF/ TDC  P4  2/2 bath Hep none (^IDWG ~5kg)  - no ESA, VDRA or Fe   Assessment/ Plan: 1. Gen weakness - multifact w/ COVID+ infection/ pna, also anemia w/ Hb 6.2 on admission. Is now SP 2u prbc. And is getting IV remdesivir / decadron as per COVID protocol.  Feeling improved. 2. Anemia ckd/ malignancy - sp 2u prbc yest am, Hb stable at  9 today. Not on ESA in light of RCCa.  3. ESRD - recent onset, due to myeloma, on HD < 1 mo. HD today no issues, plan next Thursday.  4. BP/volume - cont BP meds.  Normotensive today. 5. 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 Multiple myeloma - seen by [facility], see their note 7. BPH - on flomax   LJannifer HickMD CPer[facility] Pager [phone]  Recent Labs  Lab 02/04/20 1930 02/04/20 1930 02/05/20 1218 02/06/20 0201 02/07/20 1113 02/08/20 0438  K 4.6  --  4.9  --   --   --   BUN 38*  --  46*  --   --   --   CREATININE 9.57*  --  10.45*  --   --   --   CALCIUM 8.7*  --  8.8*  --   --   --   HGB 6.2*   < > 7.9*   < > 9.2*  9.0*   < > = values in this interval not displayed.   Inpatient medications: . acetaminophen      . amLODipine  10 mg Oral Daily  . atorvastatin  40 mg Oral Daily  . Chlorhexidine Gluconate Cloth  6 each Topical Q0600  . dexamethasone (DECADRON) injection  6 mg Intravenous Q24H  . feeding supplement (NEPRO CARB STEADY)  237 mL Oral BID BM  . heparin  5,000 Units Subcutaneous Q8H  . insulin aspart  0-5 Units Subcutaneous QHS  . insulin aspart  0-6 Units Subcutaneous TID WC  . metoprolol tartrate  12.5 mg Oral BID  . multivitamin  1 tablet Oral QHS  . pantoprazole  40 mg Oral Daily  . tamsulosin  0.4 mg Oral Daily   . remdesivir 100 mg in NS 100 mL 100 mg (02/08/20 1147)   acetaminophen **OR** acetaminophen, ALPRAZolam, melatonin

## 2020-02-09 ENCOUNTER — Telehealth: Payer: Self-pay | Admitting: Hospice

## 2020-02-09 MED ORDER — DEXAMETHASONE 6 MG PO TABS
6.0000 mg | ORAL_TABLET | Freq: Every day | ORAL | 0 refills | Status: DC
Start: 2020-02-09 — End: 2020-02-16

## 2020-02-09 NOTE — Progress Notes (Signed)
At pt bedside this am to give morning medication. When explaining all medication to pt he refuses all except flomax and amlodipine. Education provided on Remdesivir treatment as today would be his last dose. Pt continued to refuse after education wa given and reports "I am not sick with COIVD". Continued education provided on COVID s/s and COVID positive test. Pt asked for Doctor to come in as he is ready to leave. Dr. Sloan Leiter on floor making rounds. Pt did not receive last dose of Remdesivir.

## 2020-02-09 NOTE — Progress Notes (Signed)
D/c instructions provided to pt and son. Called and spoke with son because pt continue to report he does not have COIVD and is going to avoid isolation. Education pt on this and he continues to refuse. When talk with son he report his dad gets like this at times and he assures he will make sure he is isolated and that he makes it to his appts like he should. Son will be providing transportation. He verbalizes d/c education. No other questions at this time.

## 2020-02-09 NOTE — Care Management (Signed)
Patient declined [place] services, only interested in [place] w [place]. Forms faxed to [facility]. No other TOC needs identiied

## 2020-02-09 NOTE — Discharge Summary (Signed)
PATIENT DETAILS Name: [name] [name] Age: 74 y.o. Sex: male Date of Birth: Jul 29, 1946 MRN: [id number]. Admitting Physician: Shela Leff, MD [id number], Christean Grief, MD  Admit Date: 02/04/2020 Discharge date: 02/09/2020  Recommendations for Outpatient Follow-up:  1. Follow up with PCP in 1-2 weeks 2. Please obtain CMP/CBC in one week 3. Please ensure follow-up with oncology, urology and nephrology.  Admitted From:  Home  Disposition: [place]: No  Equipment/Devices: None  Discharge Condition: Stable  CODE STATUS: FULL CODE  Diet recommendation:  Diet Order            Diet - low sodium heart healthy        Diet Carb Modified        DIET DYS 3 Room service appropriate? Yes; Fluid consistency: Thin; Fluid restriction: 1500 mL Fluid  Diet effective now               Brief Summary: See H&P, Labs, Consult and Test reports for all details in brief, patient is a 74 year old male with history of ESRD on HD, multiple myeloma, PAF, CAD, COPD, DM-2 (diet-controlled), HTN who presented to the ED via EMS for evaluation of generalized weakness, he was found acute on chronic anemia with a hemoglobin of 6.2-FOBT was negative-patient was also found to have COVID-19 pneumonia-subsequently admitted to the [facility] hospitalist service.  Brief Hospital Course: Anemia: Symptomatic with exertional dyspnea-hemoglobin now stable after 3 units of PRBC.  No signs of GI bleed-no history of melena/hematochezia-FOBT negative.  Likely anemia secondary to ESRD, underlying multiple myeloma.  Plan is to follow with nephrology/oncology in the outpatient setting.  COVID-19 pneumonia: Chest x-ray showed multifocal pneumonia-he was not hypoxic-but given immunocompromise status and other multiple medical comorbidities-treated with steroids and remdesivir.  Currently not hypoxic-stable-and anxious to leave the hospital.  COVID-19 Labs:  Recent Labs    02/07/20 1113 02/08/20 0438  CRP 2.2*  1.4*    Lab Results  Component Value Date   SARSCOV2NAA POSITIVE (A) 02/04/2020   [place] NEGATIVE 01/22/2020   SARSCOV2NAA DETECTED (A) 08/23/2019   [place] NEGATIVE 06/26/2019    ESRD on HD TTS: Continue outpatient follow-up with nephrology.  Multiple myeloma: Seen by oncology on 5/24-please resume outpatient follow-up with oncology.   Per last oncology note: "if patient is agreeable/tolerant with ongoing HD and is agreeable with and has a clear plan to address RCC then we could consider restarting systemic myeloma treatment as outpatient with demonstrated patient compliance. -if patient declined surgery for RCC -- would recommend hospice.  Left renal mass: Suspicion for renal cell carcinoma-Dr. Waldron Labs discussed findings with patient and son-Per his note-patient has declined therapy/resection surgery in the past-have recommended to patient that he follow with his primary oncologist for further continued care.  HTN: BP stable-continue amlodipine and metoprolol.  HLD: Continue Lipitor  BPH: Continue Flomax  Procedures/Studies: None  Discharge Diagnoses:  Principal Problem:   Symptomatic anemia Active Problems:   Essential (primary) hypertension   BPH (benign prostatic hyperplasia)   Diabetes (HCC)   ESRD (end stage renal disease) ([place])   Discharge Instructions:    Person Under Monitoring Name: [name] [name]  Location: [address] [place] [address]   Infection Prevention Recommendations for Individuals Confirmed to have, or Being Evaluated for, 2019 Novel Coronavirus (COVID-19) Infection Who Receive Care at Home  Individuals who are confirmed to have, or are being evaluated for, COVID-19 should follow the prevention steps below until a healthcare provider or local or state health  department says they can return to normal activities.  Stay home except to get medical care You should restrict activities outside your home, except for getting medical  care. Do not go to work, school, or public areas, and do not use public transportation or taxis.  Call ahead before visiting your doctor Before your medical appointment, call the healthcare provider and tell them that you have, or are being evaluated for, COVID-19 infection. This will help the healthcare provider's office take steps to keep other people from getting infected. Ask your healthcare provider to call the local or state health department.  Monitor your symptoms Seek prompt medical attention if your illness is worsening (e.g., difficulty breathing). Before going to your medical appointment, call the healthcare provider and tell them that you have, or are being evaluated for, COVID-19 infection. Ask your healthcare provider to call the local or state health department.  Wear a facemask You should wear a facemask that covers your nose and mouth when you are in the same room with other people and when you visit a healthcare provider. People who live with or visit you should also wear a facemask while they are in the same room with you.  Separate yourself from other people in your home As much as possible, you should stay in a different room from other people in your home. Also, you should use a separate bathroom, if available.  Avoid sharing household items You should not share dishes, drinking glasses, cups, eating utensils, towels, bedding, or other items with other people in your home. After using these items, you should wash them thoroughly with soap and water.  Cover your coughs and sneezes Cover your mouth and nose with a tissue when you cough or sneeze, or you can cough or sneeze into your sleeve. Throw used tissues in a lined trash can, and immediately wash your hands with soap and water for at least 20 seconds or use an alcohol-based hand rub.  Wash your [facility] your hands often and thoroughly with soap and water for at least 20 seconds. You can use an alcohol-based  hand sanitizer if soap and water are not available and if your hands are not visibly dirty. Avoid touching your eyes, nose, and mouth with unwashed hands.   Prevention Steps for Caregivers and Household Members of Individuals Confirmed to have, or Being Evaluated for, COVID-19 Infection Being Cared for in the Home  If you live with, or provide care at home for, a person confirmed to have, or being evaluated for, COVID-19 infection please follow these guidelines to prevent infection:  Follow healthcare provider's instructions Make sure that you understand and can help the patient follow any healthcare provider instructions for all care.  Provide for the patient's basic needs You should help the patient with basic needs in the home and provide support for getting groceries, prescriptions, and other personal needs.  Monitor the patient's symptoms If they are getting sicker, call his or her medical provider and tell them that the patient has, or is being evaluated for, COVID-19 infection. This will help the healthcare provider's office take steps to keep other people from getting infected. Ask the healthcare provider to call the local or state health department.  Limit the number of people who have contact with the patient  If possible, have only one caregiver for the patient.  Other household members should stay in another home or place of residence. If this is not possible, they should stay  in another room,  or be separated from the patient as much as possible. Use a separate bathroom, if available.  Restrict visitors who do not have an essential need to be in the home.  Keep older adults, very young children, and other sick people away from the patient Keep older adults, very young children, and those who have compromised immune systems or chronic health conditions away from the patient. This includes people with chronic heart, lung, or kidney conditions, diabetes, and  cancer.  Ensure good ventilation Make sure that shared spaces in the home have good air flow, such as from an air conditioner or an opened window, weather permitting.  Wash your hands often  Wash your hands often and thoroughly with soap and water for at least 20 seconds. You can use an alcohol based hand sanitizer if soap and water are not available and if your hands are not visibly dirty.  Avoid touching your eyes, nose, and mouth with unwashed hands.  Use disposable paper towels to dry your hands. If not available, use dedicated cloth towels and replace them when they become wet.  Wear a facemask and gloves  Wear a disposable facemask at all times in the room and gloves when you touch or have contact with the patient's blood, body fluids, and/or secretions or excretions, such as sweat, saliva, sputum, nasal mucus, vomit, urine, or feces.  Ensure the mask fits over your nose and mouth tightly, and do not touch it during use.  Throw out disposable facemasks and gloves after using them. Do not reuse.  Wash your hands immediately after removing your facemask and gloves.  If your personal clothing becomes contaminated, carefully remove clothing and launder. Wash your hands after handling contaminated clothing.  Place all used disposable facemasks, gloves, and other waste in a lined container before disposing them with other household waste.  Remove gloves and wash your hands immediately after handling these items.  Do not share dishes, glasses, or other household items with the patient  Avoid sharing household items. You should not share dishes, drinking glasses, cups, eating utensils, towels, bedding, or other items with a patient who is confirmed to have, or being evaluated for, COVID-19 infection.  After the person uses these items, you should wash them thoroughly with soap and water.  Wash laundry thoroughly  Immediately remove and wash clothes or bedding that have blood, body  fluids, and/or secretions or excretions, such as sweat, saliva, sputum, nasal mucus, vomit, urine, or feces, on them.  Wear gloves when handling laundry from the patient.  Read and follow directions on labels of laundry or clothing items and detergent. In general, wash and dry with the warmest temperatures recommended on the label.  Clean all areas the individual has used often  Clean all touchable surfaces, such as counters, tabletops, doorknobs, bathroom fixtures, toilets, phones, keyboards, tablets, and bedside tables, every day. Also, clean any surfaces that may have blood, body fluids, and/or secretions or excretions on them.  Wear gloves when cleaning surfaces the patient has come in contact with.  Use a diluted bleach solution (e.g., dilute bleach with 1 part bleach and 10 parts water) or a household disinfectant with a label that says EPA-registered for coronaviruses. To make a bleach solution at home, add 1 tablespoon of bleach to 1 quart (4 cups) of water. For a larger supply, add  cup of bleach to 1 gallon (16 cups) of water.  Read labels of cleaning products and follow recommendations provided on product labels. Labels contain instructions  for safe and effective use of the cleaning product including precautions you should take when applying the product, such as wearing gloves or eye protection and making sure you have good ventilation during use of the product.  Remove gloves and wash hands immediately after cleaning.  Monitor yourself for signs and symptoms of illness Caregivers and household members are considered close contacts, should monitor their health, and will be asked to limit movement outside of the home to the extent possible. Follow the monitoring steps for close contacts listed on the symptom monitoring form.   ? If you have additional questions, contact your local health department or call the epidemiologist on call at [phone] (available 24/7). ? This  guidance is subject to change. For the most up-to-date guidance from CDC, please refer to their website: YouBlogs.pl    Activity:  As tolerated  Discharge Instructions    Diet - low sodium heart healthy   Complete by: As directed    Diet Carb Modified   Complete by: As directed    Discharge instructions   Complete by: As directed    Follow with Primary MD  Nolene Ebbs, MD in 1-2 weeks  Please follow with your dialysis center at their usual schedule  Please follow-up with your primary oncologist (for multiple myeloma) and primary urologist (for renal mass)  Please get a complete blood count and chemistry panel checked by your Primary MD at your next visit, and again as instructed by your Primary MD.  Get Medicines reviewed and adjusted: Please take all your medications with you for your next visit with your Primary MD  Laboratory/radiological data: Please request your Primary MD to go over all hospital tests and procedure/radiological results at the follow up, please ask your Primary MD to get all Hospital records sent to his/her office.  In some cases, they will be blood work, cultures and biopsy results pending at the time of your discharge. Please request that your primary care M.D. follows up on these results.  Also Note the following: If you experience worsening of your admission symptoms, develop shortness of breath, life threatening emergency, suicidal or homicidal thoughts you must seek medical attention immediately by calling 911 or calling your MD immediately  if symptoms less severe.  You must read complete instructions/literature along with all the possible adverse reactions/side effects for all the Medicines you take and that have been prescribed to you. Take any new Medicines after you have completely understood and accpet all the possible adverse reactions/side effects.   Do not drive when taking Pain  medications or sleeping medications (Benzodaizepines)  Do not take more than prescribed Pain, Sleep and Anxiety Medications. It is not advisable to combine anxiety,sleep and pain medications without talking with your primary care practitioner  Special Instructions: If you have smoked or chewed Tobacco  in the last 2 yrs please stop smoking, stop any regular Alcohol  and or any Recreational drug use.  Wear Seat belts while driving.  Please note: You were cared for by a hospitalist during your hospital stay. Once you are discharged, your primary care physician will handle any further medical issues. Please note that NO REFILLS for any discharge medications will be authorized once you are discharged, as it is imperative that you return to your primary care physician (or establish a relationship with a primary care physician if you do not have one) for your post hospital discharge needs so that they can reassess your need for medications and monitor your lab values.  Increase activity slowly   Complete by: As directed      Allergies as of 02/09/2020      Reactions   Amoxicillin Other (See Comments)   Tolerates Unasyn. Can't move Has patient had a PCN reaction causing immediate rash, facial/tongue/throat swelling, SOB or lightheadedness with hypotension: No Has patient had a PCN reaction causing severe rash involving mucus membranes or skin necrosis: No Has patient had a PCN reaction that required hospitalization No Has patient had a PCN reaction occurring within the last 10 years: No If all of the above answers are "NO", then may proceed with Cephalosporin use.   Penicillins Other (See Comments)   Tolerates Unasyn. Can't move/dizziness Has patient had a PCN reaction causing immediate rash, facial/tongue/throat swelling, SOB or lightheadedness with hypotension: No Has patient had a PCN reaction causing severe rash involving mucus membranes or skin necrosis: No Has patient had a PCN reaction that  required hospitalization No Has patient had a PCN reaction occurring within the last 10 years: No If all of the above answers are "NO", then may proceed with Cephalosporin use.      Medication List    TAKE these medications   amLODipine 10 MG tablet Commonly known as: NORVASC Take 1 tablet (10 mg total) by mouth daily.   atorvastatin 40 MG tablet Commonly known as: LIPITOR Take 40 mg by mouth daily.   cetirizine 10 MG tablet Commonly known as: ZYRTEC TAKE 1 TABLET (10 MG TOTAL) BY MOUTH DAILY AS NEEDED (SEASONAL ALLERGIES). What changed: reasons to take this   dexamethasone 6 MG tablet Commonly known as: DECADRON Take 1 tablet (6 mg total) by mouth daily.   FISH OIL PO Take 1 capsule by mouth daily.   HYDROcodone-acetaminophen 5-325 MG tablet Commonly known as: NORCO/VICODIN Take 1-2 tablets by mouth every 6 (six) hours as needed (gout pain).   metoprolol tartrate 25 MG tablet Commonly known as: LOPRESSOR Take 0.5 tablets (12.5 mg total) by mouth 2 (two) times daily.   multivitamin Tabs tablet Take 1 tablet by mouth at bedtime.   OneTouch Ultra test strip Generic drug: glucose blood Check FSBS twice a day. Dx: E11.21, N18.32 What changed:   how much to take  how to take this  when to take this   tamsulosin 0.4 MG Caps capsule Commonly known as: FLOMAX Take 1 capsule (0.4 mg total) by mouth daily.   VITAMIN B-12 PO Take 1 tablet by mouth daily.   VITAMIN D3 PO Take 1 tablet by mouth daily.      Follow-up Information    Nolene Ebbs, MD. Schedule an appointment as soon as possible for a visit in 1 week(s).   Specialty: Internal Medicine Contact information: [address] [place] [address] [phone]        Minus Breeding, MD. Schedule an appointment as soon as possible for a visit.   Specialty: Cardiology Contact information: [address] [place] [address] [phone]        Brunetta Genera, MD.  Schedule an appointment as soon as possible for a visit in 2 week(s).   Specialties: Hematology, Oncology Contact information: [place] [address] [phone]        Dialysis center Follow up.   Why: At your usual schedule         Allergies  Allergen Reactions  . Amoxicillin Other (See Comments)    Tolerates Unasyn. Can't move Has patient had a PCN reaction causing immediate rash, facial/tongue/throat  swelling, SOB or lightheadedness with hypotension: No Has patient had a PCN reaction causing severe rash involving mucus membranes or skin necrosis: No Has patient had a PCN reaction that required hospitalization No Has patient had a PCN reaction occurring within the last 10 years: No If all of the above answers are "NO", then may proceed with Cephalosporin use.   [name] Penicillins Other (See Comments)    Tolerates Unasyn. Can't move/dizziness Has patient had a PCN reaction causing immediate rash, facial/tongue/throat swelling, SOB or lightheadedness with hypotension: No Has patient had a PCN reaction causing severe rash involving mucus membranes or skin necrosis: No Has patient had a PCN reaction that required hospitalization No Has patient had a PCN reaction occurring within the last 10 years: No If all of the above answers are "NO", then may proceed with Cephalosporin use.      Consultations:   nephrology   Other Procedures/Studies: IR Fluoro Guide CV Line Right  Result Date: 01/24/2020 INDICATION: 74 year old male referred for tunneled hemodialysis catheter EXAM: TUNNELED CENTRAL VENOUS HEMODIALYSIS CATHETER PLACEMENT WITH ULTRASOUND AND FLUOROSCOPIC GUIDANCE MEDICATIONS: 1 g vancomycin. The antibiotic was given in an appropriate time interval prior to skin puncture. ANESTHESIA/SEDATION: Moderate (conscious) sedation was employed during this procedure. A total of Versed 1.0 mg and Fentanyl 25 mcg was administered intravenously. Moderate Sedation  Time: 14 minutes. The patient's level of consciousness and vital signs were monitored continuously by radiology nursing throughout the procedure under my direct supervision. FLUOROSCOPY TIME:  Fluoroscopy Time: 0 minutes 48 seconds (4 mGy). COMPLICATIONS: None PROCEDURE: Informed written consent was obtained from the patient after a discussion of the risks, benefits, and alternatives to treatment. Questions regarding the procedure were encouraged and answered. The right neck and chest were prepped with chlorhexidine in a sterile fashion, and a sterile drape was applied covering the operative field. Maximum barrier sterile technique with sterile gowns and gloves were used for the procedure. A timeout was performed prior to the initiation of the procedure. Ultrasound survey was performed. Micropuncture kit was utilized to access the right internal jugular vein under direct, real-time ultrasound guidance after the overlying soft tissues were anesthetized with 1% lidocaine with epinephrine. Stab incision was made with 11 blade scalpel. Microwire was passed centrally. The microwire was then marked to measure appropriate internal catheter length. External tunneled length was estimated. A total tip to cuff length of 23 cm was selected. 035 guidewire was advanced to the level of the IVC. Skin and subcutaneous tissues of chest wall below the clavicle were generously infiltrated with 1% lidocaine for local anesthesia. A small stab incision was made with 11 blade scalpel. The selected hemodialysis catheter was tunneled in a retrograde fashion from the anterior chest wall to the venotomy incision. Serial dilation was performed and then a peel-away sheath was placed. The catheter was then placed through the peel-away sheath with tips ultimately positioned within the superior aspect of the right atrium. Final catheter positioning was confirmed and documented with a spot radiographic image. The catheter aspirates and flushes  normally. The catheter was flushed with appropriate volume heparin dwells. The catheter exit site was secured with a 0-Prolene retention suture. Gel-Foam slurry was infused into the soft tissue tract. The venotomy incision was closed Derma bond and sterile dressing. Dressings were applied at the chest wall. Patient tolerated the procedure well and remained hemodynamically stable throughout. No complications were encountered and no significant blood loss encountered. IMPRESSION: Status post right IJ tunneled hemodialysis catheter. Signed, Dulcy Fanny. Earleen Newport,  DO, RPVI [facility] Radiology Electronically Signed   By: Corrie Mckusick D.O.   On: 01/24/2020 17:27   IR US Guide Vasc Access Right  Result Date: 01/24/2020 INDICATION: 74 year old male referred for tunneled hemodialysis catheter EXAM: TUNNELED CENTRAL VENOUS HEMODIALYSIS CATHETER PLACEMENT WITH ULTRASOUND AND FLUOROSCOPIC GUIDANCE MEDICATIONS: 1 g vancomycin. The antibiotic was given in an appropriate time interval prior to skin puncture. ANESTHESIA/SEDATION: Moderate (conscious) sedation was employed during this procedure. A total of Versed 1.0 mg and Fentanyl 25 mcg was administered intravenously. Moderate Sedation Time: 14 minutes. The patient's level of consciousness and vital signs were monitored continuously by radiology nursing throughout the procedure under my direct supervision. FLUOROSCOPY TIME:  Fluoroscopy Time: 0 minutes 48 seconds (4 mGy). COMPLICATIONS: None PROCEDURE: Informed written consent was obtained from the patient after a discussion of the risks, benefits, and alternatives to treatment. Questions regarding the procedure were encouraged and answered. The right neck and chest were prepped with chlorhexidine in a sterile fashion, and a sterile drape was applied covering the operative field. Maximum barrier sterile technique with sterile gowns and gloves were used for the procedure. A timeout  was performed prior to the initiation of the procedure. Ultrasound survey was performed. Micropuncture kit was utilized to access the right internal jugular vein under direct, real-time ultrasound guidance after the overlying soft tissues were anesthetized with 1% lidocaine with epinephrine. Stab incision was made with 11 blade scalpel. Microwire was passed centrally. The microwire was then marked to measure appropriate internal catheter length. External tunneled length was estimated. A total tip to cuff length of 23 cm was selected. 035 guidewire was advanced to the level of the IVC. Skin and subcutaneous tissues of chest wall below the clavicle were generously infiltrated with 1% lidocaine for local anesthesia. A small stab incision was made with 11 blade scalpel. The selected hemodialysis catheter was tunneled in a retrograde fashion from the anterior chest wall to the venotomy incision. Serial dilation was performed and then a peel-away sheath was placed. The catheter was then placed through the peel-away sheath with tips ultimately positioned within the superior aspect of the right atrium. Final catheter positioning was confirmed and documented with a spot radiographic image. The catheter aspirates and flushes normally. The catheter was flushed with appropriate volume heparin dwells. The catheter exit site was secured with a 0-Prolene retention suture. Gel-Foam slurry was infused into the soft tissue tract. The venotomy incision was closed Derma bond and sterile dressing. Dressings were applied at the chest wall. Patient tolerated the procedure well and remained hemodynamically stable throughout. No complications were encountered and no significant blood loss encountered. IMPRESSION: Status post right IJ tunneled hemodialysis catheter. Signed, Dulcy Fanny. Dellia Nims, RPVI [facility] Radiology Electronically Signed   By: Corrie Mckusick D.O.   On: 01/24/2020 17:27    DG CHEST PORT 1 VIEW  Result Date: 02/05/2020 CLINICAL DATA:  COVID-19, new onset dialysis EXAM: PORTABLE CHEST 1 VIEW COMPARISON:  01/22/2020 FINDINGS: Mild subpleural opacities in the right upper lobe and left upper lobe/lingula. Mild retrocardiac opacity. These findings favor multifocal pneumonia in this patient with known COVID. No pleural effusion or pneumothorax. Cardiomegaly. Right IJ dual lumen dialysis catheter terminating in the upper right atrium. IMPRESSION: Multifocal pneumonia in this patient with known COVID. Electronically Signed   By: Julian Hy M.D.   On: 02/05/2020 00:57   DG CHEST PORT 1 VIEW  Result Date: 01/22/2020 CLINICAL DATA:  Shortness  of breath EXAM: PORTABLE CHEST 1 VIEW COMPARISON:  11/30/2018 FINDINGS: Cardiomegaly and aortic tortuosity. There is no edema, consolidation, effusion, or pneumothorax. Hazy density at the right apex is chronic and attributed to ectatic vessels based on 2018 chest CT. IMPRESSION: Cardiomegaly without failure. Electronically Signed   By: Monte Fantasia M.D.   On: 01/22/2020 07:06   CT RENAL STONE STUDY  Result Date: 01/22/2020 CLINICAL DATA:  74 year old male with a history of flank pain EXAM: CT ABDOMEN AND PELVIS WITHOUT CONTRAST TECHNIQUE: Multidetector CT imaging of the abdomen and pelvis was performed following the standard protocol without IV contrast. COMPARISON:  Ultrasound 06/26/2019, CT 06/01/2019 FINDINGS: Lower chest: No acute finding of the lower chest. Hepatobiliary: Similar appearance of liver with small right-sided hypodense focus measuring 11 mm and additional small subcentimeter hypodense foci within the right liver. Gallbladder decompressed. Pancreas: Unremarkable Spleen: Unremarkable Adrenals/Urinary Tract: - Right adrenal gland:  Unremarkable - Left adrenal gland: Unremarkable. - Right kidney: Similar appearance of the right kidney with multiple rounded lesions some of which are hyperdense and some of which are  low-density. Complex lesion at the inferior renal cortex on the right estimated 4.7 cm x 4.5 cm, similar to the comparison. No evidence of hydronephrosis or nephrolithiasis. Unremarkable course of the right ureter. - Left Kidney: No evidence of left-sided hydronephrosis. Unremarkable course of the left ureter. Redemonstration of multiple low-density and high density lesions of left kidney. 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 The axial dimensions on the prior CT measured 7.2 cm x 6.2 cm while the axial dimensions on the current CT are estimated 7.5 cm x 8.7 cm - Urinary Bladder: Urinary bladder is relatively unremarkable partially distended. Impression on the bladder base secondary to the median lobe of the prostate. Stomach/Bowel: - Stomach: Unremarkable. - Small bowel: Unremarkable - Appendix: Appendix is not visualized, however, no inflammatory changes are present adjacent to the cecum to indicate an appendicitis. - Colon: Surgical changes in the region of the cecum. Redemonstration of ventral hernia containing segment of transverse colon. No evidence of obstruction. Colonic diverticula. No inflammatory changes. No significant stool burden. Vascular/Lymphatic: Atherosclerotic calcifications of the abdominal aorta. Similar diameter of the infrarenal abdominal aorta with similar course caliber and contour. Greatest diameter measures 28 mm, similar to the comparison. Atherosclerotic changes of the bilateral iliac arteries. No lymphadenopathy. Reproductive: Enlarged prostate with the diameter on axial images measuring 6.7 cm x 8.2 cm. Impression on the bladder base from the median lobe. Other: Small umbilical hernia which again appears to have a knuckle of small bowel entering the hernia sac. No evidence of associated obstruction. Supraumbilical ventral wall hernia again noted  containing short segment of transverse colon. No significant inflammatory changes or evidence of obstruction. Bilateral fat containing inguinal hernia with trace fluid on the left. Musculoskeletal: No acute displaced fracture. Multilevel degenerative changes of the lumbar spine with multiple Schmorl's nodes. Vacuum disc phenomenon present at all levels except for L4-L5. No bony canal narrowing with posterior disc bulges at all levels. Degenerative changes of the bilateral hips. IMPRESSION: CT is negative for nephrolithiasis or hydronephrosis. No acute CT finding identified that would account for flank pain. 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 Urology referral is indicated if not previously completed.  Redemonstration of additional bilateral renal lesions, incompletely characterized on the current CT. This includes a complex lesion at the inferior right kidney which may represent a partially cystic additional renal cell carcinoma. Again, urology referral is indicated. Unchanged appearance of a supraumbilical ventral hernia containing transverse colon. Unchanged appearance of umbilical hernia which contains short segment of small bowel, without obstruction. Prostatomegaly. Aortic Atherosclerosis (ICD10-I70.0). Unchanged diameter of the infrarenal abdominal aorta measuring 2.8 cm. Ectatic abdominal aorta at risk for aneurysm development. Recommend followup by ultrasound in 5 years. This recommendation follows ACR consensus guidelines: White Paper of the ACR Incidental Findings Committee II on Vascular Findings. J Am Coll Radiol 2013; 10:789-794. Additional ancillary findings as above. Electronically Signed   By: Corrie Mckusick D.O.   On: 01/22/2020 12:38     TODAY-DAY OF DISCHARGE:  Subjective:   Chad Chittick today has no headache,no chest abdominal pain,no new weakness  tingling or numbness, feels much better wants to go home today.   Objective:   Blood pressure (!) 145/75, pulse 68, temperature 98.6 F (37 C), temperature source Oral, resp. rate 17, height 5' 9"  (1.753 m), weight 91.8 kg, SpO2 94 %.  Intake/Output Summary (Last 24 hours) at 02/09/2020 1023 Last data filed at 02/09/2020 2500 Gross per 24 hour  Intake 580 ml  Output 2550 ml  Net -1970 ml   Filed Weights   02/08/20 0808 02/08/20 1130 02/09/20 0500  Weight: 93.6 kg 91.7 kg 91.8 kg    Exam: Awake Alert, Oriented *3, No new F.N deficits, Normal affect [place].AT,PERRAL Supple Neck,No JVD, No cervical lymphadenopathy appriciated.  Symmetrical Chest wall movement, Good air movement bilaterally, CTAB RRR,No Gallops,Rubs or new Murmurs, No Parasternal Heave +ve B.Sounds, Abd Soft, Non tender, No organomegaly appriciated, No rebound -guarding or rigidity. No Cyanosis, Clubbing or edema, No new Rash or bruise   PERTINENT RADIOLOGIC STUDIES: IR Fluoro Guide CV Line Right  Result Date: 01/24/2020 INDICATION: 74 year old male referred for tunneled hemodialysis catheter EXAM: TUNNELED CENTRAL VENOUS HEMODIALYSIS CATHETER PLACEMENT WITH ULTRASOUND AND FLUOROSCOPIC GUIDANCE MEDICATIONS: 1 g vancomycin. The antibiotic was given in an appropriate time interval prior to skin puncture. ANESTHESIA/SEDATION: Moderate (conscious) sedation was employed during this procedure. A total of Versed 1.0 mg and Fentanyl 25 mcg was administered intravenously. Moderate Sedation Time: 14 minutes. The patient's level of consciousness and vital signs were monitored continuously by radiology nursing throughout the procedure under my direct supervision. FLUOROSCOPY TIME:  Fluoroscopy Time: 0 minutes 48 seconds (4 mGy). COMPLICATIONS: None PROCEDURE: Informed written consent was obtained from the patient after a discussion of the risks, benefits, and alternatives to treatment. Questions regarding the procedure were encouraged and  answered. The right neck and chest were prepped with chlorhexidine in a sterile fashion, and a sterile drape was applied covering the operative field. Maximum barrier sterile technique with sterile gowns and gloves were used for the procedure. A timeout was performed prior to the initiation of the procedure. Ultrasound survey was performed. Micropuncture kit was utilized to access the right internal jugular vein under direct, real-time ultrasound guidance after the overlying soft tissues were anesthetized with 1% lidocaine with epinephrine. Stab incision was made with 11 blade scalpel. Microwire was passed centrally. The microwire was then marked to measure appropriate internal catheter length. External tunneled length was estimated. A total tip to cuff length of 23 cm was selected. 035 guidewire was advanced to the level of the IVC. Skin and subcutaneous tissues of chest wall below the clavicle were generously infiltrated with  1% lidocaine for local anesthesia. A small stab incision was made with 11 blade scalpel. The selected hemodialysis catheter was tunneled in a retrograde fashion from the anterior chest wall to the venotomy incision. Serial dilation was performed and then a peel-away sheath was placed. The catheter was then placed through the peel-away sheath with tips ultimately positioned within the superior aspect of the right atrium. Final catheter positioning was confirmed and documented with a spot radiographic image. The catheter aspirates and flushes normally. The catheter was flushed with appropriate volume heparin dwells. The catheter exit site was secured with a 0-Prolene retention suture. Gel-Foam slurry was infused into the soft tissue tract. The venotomy incision was closed Derma bond and sterile dressing. Dressings were applied at the chest wall. Patient tolerated the procedure well and remained hemodynamically stable throughout. No complications were encountered and no significant blood loss  encountered. IMPRESSION: Status post right IJ tunneled hemodialysis catheter. Signed, Dulcy Fanny. Dellia Nims, RPVI [facility] Interventional Radiology Specialists [facility] Radiology Electronically Signed   By: Corrie Mckusick D.O.   On: 01/24/2020 17:27   IR US Guide Vasc Access Right  Result Date: 01/24/2020 INDICATION: 74 year old male referred for tunneled hemodialysis catheter EXAM: TUNNELED CENTRAL VENOUS HEMODIALYSIS CATHETER PLACEMENT WITH ULTRASOUND AND FLUOROSCOPIC GUIDANCE MEDICATIONS: 1 g vancomycin. The antibiotic was given in an appropriate time interval prior to skin puncture. ANESTHESIA/SEDATION: Moderate (conscious) sedation was employed during this procedure. A total of Versed 1.0 mg and Fentanyl 25 mcg was administered intravenously. Moderate Sedation Time: 14 minutes. The patient's level of consciousness and vital signs were monitored continuously by radiology nursing throughout the procedure under my direct supervision. FLUOROSCOPY TIME:  Fluoroscopy Time: 0 minutes 48 seconds (4 mGy). COMPLICATIONS: None PROCEDURE: Informed written consent was obtained from the patient after a discussion of the risks, benefits, and alternatives to treatment. Questions regarding the procedure were encouraged and answered. The right neck and chest were prepped with chlorhexidine in a sterile fashion, and a sterile drape was applied covering the operative field. Maximum barrier sterile technique with sterile gowns and gloves were used for the procedure. A timeout was performed prior to the initiation of the procedure. Ultrasound survey was performed. Micropuncture kit was utilized to access the right internal jugular vein under direct, real-time ultrasound guidance after the overlying soft tissues were anesthetized with 1% lidocaine with epinephrine. Stab incision was made with 11 blade scalpel. Microwire was passed centrally. The microwire was then marked to measure appropriate internal catheter length.  External tunneled length was estimated. A total tip to cuff length of 23 cm was selected. 035 guidewire was advanced to the level of the IVC. Skin and subcutaneous tissues of chest wall below the clavicle were generously infiltrated with 1% lidocaine for local anesthesia. A small stab incision was made with 11 blade scalpel. The selected hemodialysis catheter was tunneled in a retrograde fashion from the anterior chest wall to the venotomy incision. Serial dilation was performed and then a peel-away sheath was placed. The catheter was then placed through the peel-away sheath with tips ultimately positioned within the superior aspect of the right atrium. Final catheter positioning was confirmed and documented with a spot radiographic image. The catheter aspirates and flushes normally. The catheter was flushed with appropriate volume heparin dwells. The catheter exit site was secured with a 0-Prolene retention suture. Gel-Foam slurry was infused into the soft tissue tract. The venotomy incision was closed Derma bond and sterile dressing. Dressings were applied at the chest wall. Patient tolerated  the procedure well and remained hemodynamically stable throughout. No complications were encountered and no significant blood loss encountered. IMPRESSION: Status post right IJ tunneled hemodialysis catheter. Signed, Dulcy Fanny. Dellia Nims, RPVI [facility] Inter[facility] Radiology Special[name] Radiology Electronically Signed   By: Corrie Mckusick D.O.   On: 01/24/2020 17:27   DG CHEST PORT 1 VIEW  Result Date: 02/05/2020 CLINICAL DATA:  COVID-19, new onset dialysis EXAM: PORTABLE CHEST 1 VIEW COMPARISON:  01/22/2020 FINDINGS: Mild subpleural opacities in the right upper lobe and left upper lobe/lingula. Mild retrocardiac opacity. These findings favor multifocal pneumonia in this patient with known COVID. No pleural effusion or pneumothorax. Cardiomegaly. Right IJ dual lumen dialysis catheter terminating in the upper  right atrium. IMPRESSION: Multifocal pneumonia in this patient with known COVID. Electronically Signed   By: Julian Hy M.D.   On: 02/05/2020 00:57   DG CHEST PORT 1 VIEW  Result Date: 01/22/2020 CLINICAL DATA:  Shortness of breath EXAM: PORTABLE CHEST 1 VIEW COMPARISON:  11/30/2018 FINDINGS: Cardiomegaly and aortic tortuosity. There is no edema, consolidation, effusion, or pneumothorax. Hazy density at the right apex is chronic and attributed to ectatic vessels based on 2018 chest CT. IMPRESSION: Cardiomegaly without failure. Electronically Signed   By: Monte Fantasia M.D.   On: 01/22/2020 07:06   CT RENAL STONE STUDY  Result Date: 01/22/2020 CLINICAL DATA:  74 year old male with a history of flank pain EXAM: CT ABDOMEN AND PELVIS WITHOUT CONTRAST TECHNIQUE: Multidetector CT imaging of the abdomen and pelvis was performed following the standard protocol without IV contrast. COMPARISON:  Ultrasound 06/26/2019, CT 06/01/2019 FINDINGS: Lower chest: No acute finding of the lower chest. Hepatobiliary: Similar appearance of liver with small right-sided hypodense focus measuring 11 mm and additional small subcentimeter hypodense foci within the right liver. Gallbladder decompressed. Pancreas: Unremarkable Spleen: Unremarkable Adrenals/Urinary Tract: - Right adrenal gland:  Unremarkable - Left adrenal gland: Unremarkable. - Right kidney: Similar appearance of the right kidney with multiple rounded lesions some of which are hyperdense and some of which are low-density. Complex lesion at the inferior renal cortex on the right estimated 4.7 cm x 4.5 cm, similar to the comparison. No evidence of hydronephrosis or nephrolithiasis. Unremarkable course of the right ureter. - Left Kidney: No evidence of left-sided hydronephrosis. Unremarkable course of the left ureter. Redemonstration of multiple low-density and high density lesions of left kidney. Interval growth of the presumed renal cell carcinoma of the  superior cortex of the left kidney which has been present on multiple prior studies including PET CT of 08/19/2018. The axial dimensions on the prior CT measured 7.2 cm x 6.2 cm while the axial dimensions on the current CT are estimated 7.5 cm x 8.7 cm - Urinary Bladder: Urinary bladder is relatively unremarkable partially distended. Impression on the bladder base secondary to the median lobe of the prostate. Stomach/Bowel: - Stomach: Unremarkable. - Small bowel: Unremarkable - Appendix: Appendix is not visualized, however, no inflammatory changes are present adjacent to the cecum to indicate an appendicitis. - Colon: Surgical changes in the region of the cecum. Redemonstration of ventral hernia containing segment of transverse colon. No evidence of obstruction. Colonic diverticula. No inflammatory changes. No significant stool burden. Vascular/Lymphatic: Atherosclerotic calcifications of the abdominal aorta. Similar diameter of the infrarenal abdominal aorta with similar course caliber and contour. Greatest diameter measures 28 mm, similar to the comparison. Atherosclerotic changes of the bilateral iliac arteries. No lymphadenopathy. Reproductive: Enlarged prostate with the diameter on axial images measuring 6.7 cm x 8.2  cm. Impression on the bladder base from the median lobe. Other: Small umbilical hernia which again appears to have a knuckle of small bowel entering the hernia sac. No evidence of associated obstruction. Supraumbilical ventral wall hernia again noted containing short segment of transverse colon. No significant inflammatory changes or evidence of obstruction. Bilateral fat containing inguinal hernia with trace fluid on the left. Musculoskeletal: No acute displaced fracture. Multilevel degenerative changes of the lumbar spine with multiple Schmorl's nodes. Vacuum disc phenomenon present at all levels except for L4-L5. No bony canal narrowing with posterior disc bulges at all levels. Degenerative  changes of the bilateral hips. IMPRESSION: CT is negative for nephrolithiasis or hydronephrosis. No acute CT finding identified that would account for flank pain. 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 Urology referral is indicated if not previously completed. Redemonstration of additional bilateral renal lesions, incompletely characterized on the current CT. This includes a complex lesion at the inferior right kidney which may represent a partially cystic additional renal cell carcinoma. Again, urology referral is indicated. Unchanged appearance of a supraumbilical ventral hernia containing transverse colon. Unchanged appearance of umbilical hernia which contains short segment of small bowel, without obstruction. Prostatomegaly. Aortic Atherosclerosis (ICD10-I70.0). Unchanged diameter of the infrarenal abdominal aorta measuring 2.8 cm. Ectatic abdominal aorta at risk for aneurysm development. Recommend followup by ultrasound in 5 years. This recommendation follows ACR consensus guidelines: White Paper of the ACR Incidental Findings Committee II on Vascular Findings. J Am Coll Radiol 2013; 10:789-794. Additional ancillary findings as above. Electronically Signed   By: Corrie Mckusick D.O.   On: 01/22/2020 12:38     PERTINENT LAB RESULTS: CBC: Recent Labs    02/07/20 1113 02/08/20 0438  WBC 5.8 6.4  HGB 9.2* 9.0*  HCT 28.3* 27.6*  PLT 218 206   CMET CMP     Component Value Date/Time   NA 136 02/05/2020 1218   NA 143 10/14/2019 1423   K 4.9 02/05/2020 1218   CL 96 (L) 02/05/2020 1218   CO2 27 02/05/2020 1218   GLUCOSE 134 (H) 02/05/2020 1218   BUN 46 (H) 02/05/2020 1218   BUN 58 (H) 10/14/2019 1423   CREATININE 10.45 (H) 02/05/2020 1218   CREATININE 4.27 (HH) 07/22/2019 0935   CREATININE 1.01 03/29/2016 1032   CALCIUM 8.8 (L) 02/05/2020 1218   PROT 6.4  (L) 01/23/2020 0322   PROT 6.6 10/14/2019 1423   ALBUMIN 3.0 (L) 01/27/2020 0855   ALBUMIN 4.1 10/14/2019 1423   AST 12 (L) 01/23/2020 0322   AST 26 07/22/2019 0935   ALT 13 01/23/2020 0322   ALT 28 07/22/2019 0935   ALKPHOS 61 01/23/2020 0322   BILITOT 0.4 01/23/2020 0322   BILITOT 0.3 10/14/2019 1423   BILITOT 0.5 07/22/2019 0935   GFRNONAA 4 (L) 02/05/2020 1218   GFRNONAA 13 (L) 07/22/2019 0935   GFRAA 5 (L) 02/05/2020 1218   GFRAA 15 (L) 07/22/2019 0935    GFR Estimated Creatinine Clearance: 6.8 mL/min (A) (by C-G formula based on SCr of 10.45 mg/dL (H)). No results for input(s): LIPASE, AMYLASE in the last 72 hours. No results for input(s): CKTOTAL, CKMB, CKMBINDEX, TROPONINI in the last 72 hours. Invalid input(s): POCBNP No results for input(s): DDIMER in the last 72 hours. No results for input(s): HGBA1C in the last 72 hours. No results for input(s): CHOL, HDL, LDLCALC, TRIG, CHOLHDL, LDLDIRECT  in the last 72 hours. No results for input(s): TSH, T4TOTAL, T3FREE, THYROIDAB in the last 72 hours.  Invalid input(s): FREET3 No results for input(s): VITAMINB12, FOLATE, FERRITIN, TIBC, IRON, RETICCTPCT in the last 72 hours. Coags: No results for input(s): INR in the last 72 hours.  Invalid input(s): PT Microbiology: Recent Results (from the past 240 hour(s))  SARS Coronavirus 2 by RT PCR (hospital order, performed in [facility] hospital lab) Nasopharyngeal Nasopharyngeal Swab     Status: Abnormal   Collection Time: 02/04/20  8:35 PM   Specimen: Nasopharyngeal Swab  Result Value Ref Range Status   SARS Coronavirus 2 POSITIVE (A) NEGATIVE Final    Comment: RESULT CALLED TO, READ BACK BY AND VERIFIED WITH: A CAIN RN [id number] 02/04/20 A BROWNING (NOTE) SARS-CoV-2 target nucleic acids are DETECTED SARS-CoV-2 RNA is generally detectable in upper respiratory specimens  during the acute phase of infection.  Positive results are indicative  of the presence of the identified virus,  but do not rule out bacterial infection or co-infection with other pathogens not detected by the test.  Clinical correlation with patient history and  other diagnostic information is necessary to determine patient infection status.  The expected result is negative. Fact Sheet for Patients:   StrictlyIdeas.no  Fact Sheet for Healthcare Providers:   BankingDealers.co.za   This test is not yet approved or cleared by the [place] FDA and  has been authorized for detection and/or diagnosis of SARS-CoV-2 by FDA under an Emergency Use Authorization (EUA).  This EUA will remain in effect (meaning this test can be  used) for the duration of  the COVID-19 declaration under Section 564(b)(1) of the Act, 21 U.S.C. section 360-bbb-3(b)(1), unless the authorization is terminated or revoked sooner. Performed at [facility], [place] [address], [place], [place] [address]   MRSA PCR Screening     Status: None   Collection Time: 02/05/20  8:54 PM   Specimen: Nasal Mucosa; Nasopharyngeal  Result Value Ref Range Status   MRSA by PCR NEGATIVE NEGATIVE Final    Comment:        The GeneXpert MRSA Assay (FDA approved for NASAL specimens only), is one component of a comprehensive MRSA colonization surveillance program. It is not intended to diagnose MRSA infection nor to guide or monitor treatment for MRSA infections. Performed at [facility], [place] [address], [place],  [address]     FURTHER DISCHARGE INSTRUCTIONS:  Get Medicines reviewed and adjusted: Please take all your medications with you for your next visit with your Primary MD  Laboratory/radiological data: Please request your Primary MD to go over all hospital tests and procedure/radiological results at the follow up, please ask your Primary MD to get all Hospital records sent to his/her office.  In some cases, they will be blood work, cultures and biopsy results pending  at the time of your discharge. Please request that your primary care M.D. goes through all the records of your hospital data and follows up on these results.  Also Note the following: If you experience worsening of your admission symptoms, develop shortness of breath, life threatening emergency, suicidal or homicidal thoughts you must seek medical attention immediately by calling 911 or calling your MD immediately  if symptoms less severe.  You must read complete instructions/literature along with all the possible adverse reactions/side effects for all the Medicines you take and that have been prescribed to you. Take any new Medicines after you have completely understood and accpet  all the possible adverse reactions/side effects.   Do not drive when taking Pain medications or sleeping medications (Benzodaizepines)  Do not take more than prescribed Pain, Sleep and Anxiety Medications. It is not advisable to combine anxiety,sleep and pain medications without talking with your primary care practitioner  Special Instructions: If you have smoked or chewed Tobacco  in the last 2 yrs please stop smoking, stop any regular Alcohol  and or any Recreational drug use.  Wear Seat belts while driving.  Please note: You were cared for by a hospitalist during your hospital stay. Once you are discharged, your primary care physician will handle any further medical issues. Please note that NO REFILLS for any discharge medications will be authorized once you are discharged, as it is imperative that you return to your primary care physician (or establish a relationship with a primary care physician if you do not have one) for your post hospital discharge needs so that they can reassess your need for medications and monitor your lab values.  Total Time spent coordinating discharge including counseling, education and face to face time equals 35 minutes.  SignedOren Binet 02/09/2020 10:23 AM

## 2020-02-09 NOTE — Progress Notes (Signed)
Occupational Therapy Evaluation Patient Details Name: BHARGAV BARBARO MRN: [id number] DOB: 04/02/1946 Today's Date: 02/09/2020    History of Present Illness 74 y.o. male admitted on 02/04/20 for generalized weakness.  Pt dx with symptomatic anemia thought to be due to ESRD and multiple myeloma.  Pt s/p multiple units of PRBCs.  Coincidental COVID 19 (+) test.  Pt with significant PMH of venous insufficiency, spinal stenosis in the cervical region, renal cell carcinoma, PAF, moderate aortic insufficency, hypertensive heart disease, CODP, DM2, asthma, CAD, Ascending aortic aneurysm,  lumbar disc surgery.     Clinical Impression   Patient up in chair on arrival and wanting to leave hospital ASAP.  Perseverating on leaving to get to his other important appointments.  Patient lives alone and has family in area to help if needed.  He is independent at baseline.  Today patient supervision with mobility and all ADLs.  Patient stating he is perfectly ok and was jumping and squatting to demonstrate.  Would benefit from having more frequent supervision from family once home just to check in.        Follow Up Recommendations  No OT follow up;Supervision - Intermittent    Equipment Recommendations  None recommended by OT    Recommendations for Other Services       Precautions / Restrictions Precautions Precautions: None      Mobility Bed Mobility               General bed mobility comments: Pt was OOB in the recliner chair.   Transfers Overall transfer level: Modified independent                    Balance Overall balance assessment: Mild deficits observed, not formally tested                                         ADL either performed or assessed with clinical judgement   ADL Overall ADL's : Needs assistance/impaired                                     Functional mobility during ADLs: Supervision/safety General ADL Comments: Patient mod  I/supervision for ADLs.       Vision         Perception     Praxis      Pertinent Vitals/Pain Pain Assessment: No/denies pain     Hand Dominance Right   Extremity/Trunk Assessment Upper Extremity Assessment Upper Extremity Assessment: Overall WFL for tasks assessed   Lower Extremity Assessment Lower Extremity Assessment: Overall WFL for tasks assessed       Communication Communication Communication: No difficulties   Cognition Arousal/Alertness: Awake/alert Behavior During Therapy: WFL for tasks assessed/performed Overall Cognitive Status: No family/caregiver present to determine baseline cognitive functioning Area of Impairment: Safety/judgement                         Safety/Judgement: Decreased awareness of safety;Decreased awareness of deficits     General Comments: Patient oriented and cognition generally WFL, though safety awareness is poor and patient talks tangentially and believes the doctors are lying to him about covid and keeping him here for no reason.     General Comments       Exercises     Shoulder Instructions  Home Living Family/patient expects to be discharged to:: Private residence Living Arrangements: Alone Available Help at Discharge: Family;Available PRN/intermittently Type of Home: Apartment Home Access: Level entry     Home Layout: One level     Bathroom Shower/Tub: Teacher, early years/pre: Standard     Home Equipment: None   Additional Comments: Pt's daughter is making an apartment for him in her new house which is currently under Architect.        Prior Functioning/Environment Level of Independence: Independent        Comments: drives        OT Problem List: Decreased safety awareness      OT Treatment/Interventions:      OT Goals(Current goals can be found in the care plan section) Acute Rehab OT Goals Patient Stated Goal: to go home OT Goal Formulation: With patient Time For  Goal Achievement: 02/23/20 Potential to Achieve Goals: Good  OT Frequency:     Barriers to D/C:            Co-evaluation              AM-PAC OT "6 Clicks" Daily Activity     Outcome Measure Help from another person eating meals?: None Help from another person taking care of personal grooming?: None Help from another person toileting, which includes using toliet, bedpan, or urinal?: None Help from another person bathing (including washing, rinsing, drying)?: None Help from another person to put on and taking off regular upper body clothing?: None Help from another person to put on and taking off regular lower body clothing?: None 6 Click Score: 24   End of Session Nurse Communication: Mobility status  Activity Tolerance: Patient tolerated treatment well Patient left: in chair;with call bell/phone within reach  OT Visit Diagnosis: Other symptoms and signs involving cognitive function                Time: 2627-0048 OT Time Calculation (min): 29 min Charges:  OT General Charges $OT Visit: 1 Visit OT Evaluation $OT Eval Moderate Complexity: 1 Mod OT Treatments $Self Care/Home Management : 8-22 mins  August Luz, OTR/L    Phylliss Bob 02/09/2020, 1:51 PM

## 2020-02-09 NOTE — Telephone Encounter (Signed)
Spoke with patient regarding Palliative services and offered to schedule a visit and he stated that he had tested positive for COVID on 02/04/20 and he didn't want anyone coming in the home at this time.  Patient said that he was going to get re-tested again to see if he tested positive again, he doesn't think he has COVID.  Patient requested that I call him back on Monday, 02/14/20, to possibly schedule a visit.

## 2020-02-10 ENCOUNTER — Telehealth: Payer: Self-pay | Admitting: Nephrology

## 2020-02-10 NOTE — Telephone Encounter (Signed)
Transition of Care Contact from [place]  Date of Discharge: 02/09/20 Date of Contact: 02/10/20 Method of contact: phone  Attempted to contact patient to discuss transition of care from inpatient admission due to anemia and COVID 19 PNA. Patient did not answer the phone.  Message was left on patient's voicemail informing them we would attempt to call them again and if unable to reach will follow up at dialysis.  Jen Mow, PA-C [facility] Pager: [phone]

## 2020-02-16 ENCOUNTER — Telehealth: Payer: Self-pay | Admitting: Hospice

## 2020-02-16 ENCOUNTER — Other Ambulatory Visit: Payer: Self-pay

## 2020-02-16 ENCOUNTER — Encounter (HOSPITAL_COMMUNITY): Payer: Self-pay | Admitting: Emergency Medicine

## 2020-02-16 ENCOUNTER — Ambulatory Visit (HOSPITAL_COMMUNITY)
Admission: EM | Admit: 2020-02-16 | Discharge: 2020-02-16 | Disposition: A | Discharge status: Home / Self Care | Payer: Medicare Other | Attending: Family Medicine | Admitting: Family Medicine

## 2020-02-16 DIAGNOSIS — R5383 Other fatigue: Secondary | ICD-10-CM

## 2020-02-16 LAB — CBC
HCT: 26.3 % — ABNORMAL LOW (ref 39.0–52.0)
Hemoglobin: 8.4 g/dL — ABNORMAL LOW (ref 13.0–17.0)
MCH: 31.3 pg (ref 26.0–34.0)
MCHC: 31.9 g/dL (ref 30.0–36.0)
MCV: 98.1 fL (ref 80.0–100.0)
Platelets: 197 10*3/uL (ref 150–400)
RBC: 2.68 MIL/uL — ABNORMAL LOW (ref 4.22–5.81)
RDW: 15.5 % (ref 11.5–15.5)
WBC: 7.2 10*3/uL (ref 4.0–10.5)
nRBC: 0 % (ref 0.0–0.2)

## 2020-02-16 NOTE — ED Triage Notes (Signed)
Patient complains of feeling fatigued.  Patient is wanting blood work done to check hgb.  Patient has multiple providers and has an appt one week from now.  Patient wants blood work done now

## 2020-02-16 NOTE — Telephone Encounter (Signed)
Spoke with patient to see if he had found out the results to his COVID test and he said that he should know something by Friday and requested that I call him back Friday afternoon.  I did explain to patient the NP that's assigned to see him is out on a family emergency and that would probably have to schedule the Palliative Consult as a telephone call until the NP gets back and then she can schedule an In-person f/u visit and he was okay with this.

## 2020-02-16 NOTE — Telephone Encounter (Signed)
Called patient back to f/u with him to see what he found out about his COVID retesting and to offer to schedule a visit for the Palliative Consult, no answer - left message with my name and contact number.

## 2020-02-16 NOTE — Discharge Instructions (Addendum)
We have checked your hemoglobin I will call when I get the results

## 2020-02-16 NOTE — ED Provider Notes (Signed)
[facility]    CSN: [id number] Arrival date & time: 02/16/20  1124      History   Chief Complaint Chief Complaint  Patient presents with  . Fatigue    HPI [name] [name] is a 74 y.o. male.   Patient is a 74 year old male with significant past medical history.  He presents today for fatigue.  He requesting hemoglobin be drawn.  He has been seen here previously for this multiple times.  He regularly has symptomatic anemia.  Has been recently hospitalized for this with blood transfusion.  Patient also goes to dialysis 3 days a week.  Recent Covid infection on 6/4.  Reporting his symptoms are consistent with anemia.  Overall just feeling tired but denies any specific dizziness, lightheadedness or shortness of breath.  ROS per HPI      Past Medical History:  Diagnosis Date  . Abnormal CT of liver    a. nodular contour suggesting cirrhosis 05/2018.  [name] Abnormal LFTs (liver function tests)   . Anxiety   . Aortic root dilatation ([place])   . Ascending aortic aneurysm ([place])   . BPH with urinary obstruction   . Bradycardia   . 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 CAD (coronary artery disease)    a. 12/2015 NSTEMI/Cath (in setting of PAF):  LM nl, LAD 30p,  LCX 74m, RCA  ok, AM 100%, RPDA1 40, RPDA 2 60 ->Med Rx.  . Childhood asthma   . Chronic diastolic CHF (congestive heart failure) ([place])    a. 12/2015 Echo: EF 50-55%, mod AI, mod Ao root dil, mild MR, mod dil LA, mod RA.  . CKD (chronic kidney disease), stage III   . Colon cancer ([place])   . COPD (chronic obstructive pulmonary disease) ([place])    pt denies at preop  . Diabetes mellitus type II    diet controlled  . History of syncope   . Hyperlipidemia   . Hypertensive heart disease   . Kidney lump 04/04/2010   Overview:  Renal Cell Carcinoma   . 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 Moderate aortic insufficiency    a. 12/2015 Echo: Mod AI.  [name]  Paroxysmal atrial fibrillation ([place])    a. 12/2015 started on Xarelto (CHA2DS2VASc = 4-5).  . Pneumonia   . Prostate cancer ([place])   . Renal cell carcinoma ([place])   . Sleep apnea    Does not like  CPAP  . Spinal stenosis in cervical region 10/12/2016  . Venous insufficiency     Patient Active Problem List   Diagnosis Date Noted  . Symptomatic anemia 02/04/2020  . ESRD (end stage renal disease) ([place]) 01/27/2020  . Goals of care, counseling/discussion   . Palliative care by specialist   . DNR (do not resuscitate) discussion   . ARF (acute renal failure) ([place]) 01/22/2020  . Ascending aortic aneurysm ([place]) 08/04/2019  . Myeloma kidney ([place])   . CKD (chronic kidney disease), stage III 06/04/2019  . Renal cell carcinoma ([place]) 06/04/2019  . Gross hematuria 06/04/2019  . COPD exacerbation ([place]) 11/05/2018  . Palpitations 10/22/2018  . Counseling regarding advance care planning and goals of care 06/22/2018  . AKI (acute kidney injury) ([place])   . Anemia   . Sepsis ([place]) 06/05/2018  . Acute lower UTI 06/05/2018  . Colon polyp 05/21/2018  . Aortic valve regurgitation 04/14/2018  . Medication management 04/14/2018  . Thoracic aortic aneurysm  without rupture ([place]) 09/29/2017  . 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 Head trauma, subsequent encounter 10/12/2016  . 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 Spinal stenosis in cervical region 10/12/2016  . Cervical spondylosis 10/12/2016  . CAD (coronary artery disease)   . Chronic diastolic CHF (congestive heart failure) ([place])   . Hyperlipidemia   . Hypertensive heart disease   . Paroxysmal atrial fibrillation ([place]) 12/17/2015  . 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 Mild intermittent asthma 10/18/2015  . Aortic root dilatation ([place]) 10/17/2015  . Diabetes ([place]) 10/17/2015  . 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  Hand paresthesia 12/09/2014  . Elevated prostate specific antigen (PSA) 07/12/2014  . Allergic rhinitis 07/12/2014  . LBP (low back pain) 07/12/2014  . Palpitation 07/11/2014  . Patient's other noncompliance with medication regimen 08/10/2013  . Chronic venous insufficiency 07/23/2012  . Obstructive apnea 04/11/2011  . Essential (primary) hypertension 09/07/2010  . 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 Anxiety, generalized 04/04/2010  . Cardiac conduction disorder 02/27/2010  . BPH (benign prostatic hyperplasia) 09/13/2009  . Abnormal findings on examination of genitourinary organs 08/09/2009  . Hereditary and idiopathic neuropathy 07/05/2009    Past Surgical History:  Procedure Laterality Date  . AV FISTULA PLACEMENT Left 01/26/2020   Procedure: left ARTERIOVENOUS (AV) FISTULA CREATION;  Surgeon: Waynetta Sandy, MD;  Location: [facility];  Service: Vascular;  Laterality: Left;  . BACK SURGERY    . CARDIAC CATHETERIZATION N/A 12/18/2015   Procedure: Left Heart Cath and Coronary Angiography;  Surgeon: Leonie Man, MD;  Location: [facility];  Service: Cardiovascular;  Laterality: N/A;  . DIAGNOSTIC LAPAROSCOPY     partial colectomy  . IR FLUORO GUIDE CV LINE RIGHT  06/28/2019  . IR FLUORO GUIDE CV LINE RIGHT  01/24/2020  . IR REMOVAL TUN CV CATH W/O FL  07/26/2019  . IR US GUIDE VASC ACCESS RIGHT  06/28/2019  . IR US GUIDE VASC ACCESS RIGHT  01/24/2020  . LAPAROSCOPIC PARTIAL COLECTOMY Right 05/21/2018   Procedure: LAPAROSCOPIC RIGHT  COLECTOMY ERAS PATHWAY;  Surgeon: Leighton Ruff, MD;  Location: [facility];  Service: General;  Laterality: Right;  . [place]   "replaced a disc"       Home Medications    Prior to Admission medications   Medication Sig Start Date End Date Taking? Authorizing Provider  amLODipine (NORVASC) 10 MG tablet Take 1 tablet (10 mg total) by mouth daily. 01/12/20  Yes [name], DO    atorvastatin (LIPITOR) 40 MG tablet Take 40 mg by mouth daily.    [provider]  cetirizine (ZYRTEC) 10 MG tablet TAKE 1 TABLET (10 MG TOTAL) BY MOUTH DAILY AS NEEDED (SEASONAL ALLERGIES). Patient taking differently: Take 10 mg by mouth daily as needed for allergies (seasonal allergies).  11/12/19   [name], DO  Cholecalciferol (VITAMIN D3 PO) Take 1 tablet by mouth daily.    [provider]  Cyanocobalamin (VITAMIN B-12 PO) Take 1 tablet by mouth daily.    [provider]  glucose blood (ONETOUCH ULTRA) test strip Check FSBS twice a day. Dx: E11.21, N18.32 Patient taking differently: 1 each by Other route See admin instructions. Check FSBS twice a day. Dx: E11.21, N18.32 08/16/19   Fulp, Ander Gaster, MD  HYDROcodone-acetaminophen (NORCO/VICODIN) 5-325 MG tablet Take 1-2 tablets by mouth every  6 (six) hours as needed (gout pain). 12/29/19   Loura Halt A, NP  metoprolol tartrate (LOPRESSOR) 25 MG tablet Take 0.5 tablets (12.5 mg total) by mouth 2 (two) times daily. Patient not taking: Reported on 02/04/2020 01/27/20 04/26/20  [place] Eric J, DO  multivitamin (RENA-VIT) TABS tablet Take 1 tablet by mouth at bedtime. 01/27/20 04/26/20  [place] Eric J, DO  Omega-3 Fatty Acids (FISH OIL PO) Take 1 capsule by mouth daily.     [provider]  tamsulosin (FLOMAX) 0.4 MG CAPS capsule Take 1 capsule (0.4 mg total) by mouth daily. 10/15/19   [name], DO    Family History Family History  Problem Relation Age of Onset  . Emphysema Father   . Asthma Father   . Liver disease Father        tumor  . Heart disease Mother   . Hypertension Mother   . Asthma Sister   . Hypertension Son     Social History Social History   Tobacco Use  . Smoking status: Former Smoker    Types: Cigars    Quit date: 09/02/1977    Years since quitting: 42.4  . Smokeless tobacco: Never Used  . Tobacco comment:    Vaping Use  . Vaping Use: Never used   Substance Use Topics  . Alcohol use: Not Currently    Comment: "stopped drinking alcohol in ~ 1980; just drank a little on the weekends when I did drink"  . Drug use: No     Allergies   Amoxicillin and Penicillins   Review of Systems Review of Systems   Physical Exam Triage Vital Signs ED Triage Vitals  Enc Vitals Group     BP 02/16/20 1303 (!) 142/80     Pulse Rate 02/16/20 1303 68     Resp 02/16/20 1303 (!) 22     Temp 02/16/20 1303 98.4 F (36.9 C)     Temp Source 02/16/20 1303 Oral     SpO2 02/16/20 1303 97 %     Weight --      Height --      Head Circumference --      Peak Flow --      Pain Score 02/16/20 1300 0     Pain Loc --      Pain Edu? --      Excl. in [place]? --    No data found.  Updated Vital Signs BP (!) 142/80 (BP Location: Right Arm)   Pulse 68   Temp 98.4 F (36.9 C) (Oral)   Resp (!) 22   SpO2 97%   Visual Acuity Right Eye Distance:   Left Eye Distance:   Bilateral Distance:    Right Eye Near:   Left Eye Near:    Bilateral Near:     Physical Exam Vitals and nursing note reviewed.  Constitutional:      Appearance: Normal appearance.  HENT:     Head: Normocephalic and atraumatic.     Nose: Nose normal.  Eyes:     Conjunctiva/sclera: Conjunctivae normal.  Pulmonary:     Effort: Pulmonary effort is normal.  Musculoskeletal:        General: Normal range of motion.     Cervical back: Normal range of motion.  Skin:    General: Skin is warm and dry.  Neurological:     Mental Status: He is alert.  Psychiatric:        Mood and Affect: Mood normal.  UC Treatments / Results  Labs (all labs ordered are listed, but only abnormal results are displayed) Labs Reviewed  CBC - Abnormal; Notable for the following components:      Result Value   RBC 2.68 (*)    Hemoglobin 8.4 (*)    HCT 26.3 (*)    All other components within normal limits    EKG   Radiology No results found.  Procedures Procedures (including critical  care time)  Medications Ordered in UC Medications - No data to display  Initial Impression / Assessment and Plan / UC Course  I have reviewed the triage vital signs and the nursing notes.  Pertinent labs & imaging results that were available during my care of the patient were reviewed by me and considered in my medical decision making (see chart for details).     Fatigue Most likely due to anemia.  CBC checked here with hemoglobin 8.4. We will message patient's primary care doctor for possible outpatient transfusion since he is having symptomatic anemia. The fatigue could also be due to recent Covid infection   Final Clinical Impressions(s) / UC Diagnoses   Final diagnoses:  Other fatigue     Discharge Instructions     We have checked your hemoglobin I will call when I get the results    ED Prescriptions    None     PDMP not reviewed this encounter.   Loura Halt A, NP 02/16/20 1558

## 2020-02-16 NOTE — ED Notes (Signed)
Patient was notified that a repeat covid test not needed and would be the same.  Patient wanting hgb drawn, again told patient this would be up to the discretion of provider, cannot promise

## 2020-02-17 ENCOUNTER — Telehealth: Payer: Self-pay

## 2020-02-17 NOTE — Telephone Encounter (Signed)
Called pt daughter and informed her

## 2020-02-17 NOTE — Telephone Encounter (Signed)
Left voicemail for patient to return call regarding blood transfusion.  Can you try him again later? He will need to go to ED since [facility] is not equipped to deal with COVID+ patients.

## 2020-02-17 NOTE — Progress Notes (Deleted)
Cardiology Office Note   Date:  02/17/2020   ID:  KAMAL JURGENS, DOB September 20, 1945, MRN [id number]  PCP:  [name], DO  Cardiologist:   Minus Breeding, MD   No chief complaint on file.     History of Present Illness: Shamond A Ruane is a 74 y.o. male who presents for evaluation of palpitations and CAD.  He was in the hospital in October.  He had acute on chronic renal insufficiency.  He was being managed for hematuria and was having trouble with urination.  He has had multiple myeloma and renal cell cancer.  He decided to stop all future oncology treatment.  He refused hospice.  He had his dialysis catheter removed.  He did however follow up with an echo and had moderate AI and mild MR.  He had aortic root enlargement.    At the last visit he had increased weakness.   He had palpitations.  Since I last saw him he was admitted for symptomatic anemia likely secondary to renal disease.  He also had Covid.   He was in the ED yesterday for fatigue.  There were no acute findings.  .I reviewed both of these records for this visit.  ***    ***  He called today and is added to my schedule.  He has decreased energy and increased weakness.  He had called the other day because his heart has been beating fast and he wanted to be seen sooner.  He was in the emergency room earlier this month.  He was anemic and required transfusion. I reviewed these records for this visit.    He has felt like his heart has been skipping and at times he feels tired.  He feels thirsty.  He feels like when he is doing something was pushing something.  He feels fatigued.  He tried to walk his 15 minutes but he says that he has been making him weak.  He does have follow-up lab work scheduled after his transfusion.  He has these episodes where he feels like he is fading out after he eats.  He does not think it is falling asleep.  Has not had any syncope or trauma.  He denies any chest pressure, neck or arm discomfort.   He is not having any new PND or orthopnea.   Past Medical History:  Diagnosis Date  . Abnormal CT of liver    a. nodular contour suggesting cirrhosis 05/2018.  [name] Abnormal LFTs (liver function tests)   . Anxiety   . Aortic root dilatation ([place])   . Ascending aortic aneurysm ([place])   . BPH with urinary obstruction   . Bradycardia   . C5-C7 level with spinal cord injury with central cord syndrome, without evidence of spinal bone injury ([place]) 10/12/2016  . CAD (coronary artery disease)    a. 12/2015 NSTEMI/Cath (in setting of PAF):  LM nl, LAD 30p,  LCX 52m RCA  ok, AM 100%, RPDA1 40, RPDA 2 60 ->Med Rx.  . Childhood asthma   . Chronic diastolic CHF (congestive heart failure) ([facility]    a. 12/2015 Echo: EF 50-55%, mod AI, mod Ao root dil, mild MR, mod dil LA, mod RA.  . CKD (chronic kidney disease), stage III   . Colon cancer ([facility]   . COPD (ch[place] obstructive pulmonary disease) ([facility]    pt denies at preop  . Diabetes mellitus type II    diet controlled  . History of syncope   .  Hyperlipidemia   . Hypertensive heart disease   . Kidney lump 04/04/2010   Overview:  Renal Cell Carcinoma   . 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 Moderate aortic insufficiency    a. 12/2015 Echo: Mod AI.  [name] Paroxysmal atrial fibrillation ([place])    a. 12/2015 started on Xarelto (CHA2DS2VASc = 4-5).  . Pneumonia   . Prostate cancer ([place])   . Renal cell carcinoma ([place])   . Sleep apnea    Does not like  CPAP  . Spinal stenosis in cervical region 10/12/2016  . Venous insufficiency     Past Surgical History:  Procedure Laterality Date  . AV FISTULA PLACEMENT Left 01/26/2020   Procedure: left ARTERIOVENOUS (AV) FISTULA CREATION;  Surgeon: Waynetta Sandy, MD;  Location: [facility];  Service: Vascular;  Laterality: Left;  . BACK SURGERY    . CARDIAC CATHETERIZATION N/A 12/18/2015   Procedure: Left Heart Cath and Coronary Angiography;  Surgeon: Leonie Man, MD;  Location: [facility];  Service:  Cardiovascular;  Laterality: N/A;  . DIAGNOSTIC LAPAROSCOPY     partial colectomy  . IR FLUORO GUIDE CV LINE RIGHT  06/28/2019  . IR FLUORO GUIDE CV LINE RIGHT  01/24/2020  . IR REMOVAL TUN CV CATH W/O FL  07/26/2019  . IR US GUIDE VASC ACCESS RIGHT  06/28/2019  . IR US GUIDE VASC ACCESS RIGHT  01/24/2020  . LAPAROSCOPIC PARTIAL COLECTOMY Right 05/21/2018   Procedure: LAPAROSCOPIC RIGHT  COLECTOMY ERAS PATHWAY;  Surgeon: Leighton Ruff, MD;  Location: [facility];  Service: General;  Laterality: Right;  . [place]   "replaced a disc"     Current Outpatient Medications  Medication Sig Dispense Refill  . amLODipine (NORVASC) 10 MG tablet Take 1 tablet (10 mg total) by mouth daily. 90 tablet 1  . atorvastatin (LIPITOR) 40 MG tablet Take 40 mg by mouth daily.    . cetirizine (ZYRTEC) 10 MG tablet TAKE 1 TABLET (10 MG TOTAL) BY MOUTH DAILY AS NEEDED (SEASONAL ALLERGIES). (Patient taking differently: Take 10 mg by mouth daily as needed for allergies (seasonal allergies). ) 90 tablet 3  . Cholecalciferol (VITAMIN D3 PO) Take 1 tablet by mouth daily.    . Cyanocobalamin (VITAMIN B-12 PO) Take 1 tablet by mouth daily.    [name] glucose blood (ONETOUCH ULTRA) test strip Check FSBS twice a day. Dx: E11.21, N18.32 (Patient taking differently: 1 each by Other route See admin instructions. Check FSBS twice a day. Dx: E11.21, N18.32) 100 each 3  . HYDROcodone-acetaminophen (NORCO/VICODIN) 5-325 MG tablet Take 1-2 tablets by mouth every 6 (six) hours as needed (gout pain). 12 tablet 0  . metoprolol tartrate (LOPRESSOR) 25 MG tablet Take 0.5 tablets (12.5 mg total) by mouth 2 (two) times daily. (Patient not taking: Reported on 02/04/2020) 90 tablet 0  . multivitamin (RENA-VIT) TABS tablet Take 1 tablet by mouth at bedtime. 90 tablet 0  . Omega-3 Fatty Acids (FISH OIL PO) Take 1 capsule by mouth daily.     . tamsulosin (FLOMAX) 0.4 MG CAPS capsule Take 1 capsule (0.4 mg total) by mouth daily. 90 capsule 0    No current facility-administered medications for this visit.    Allergies:   Amoxicillin and Penicillins    ROS:  Please see the history of present illness.   Otherwise, review of systems are positive for ***.   All other systems are reviewed and negative.    PHYSICAL EXAM: VS:  There were  no vitals taken for this visit. , BMI There is no height or weight on file to calculate BMI. GENERAL:  Well appearing NECK:  No jugular venous distention, waveform within normal limits, carotid upstroke brisk and symmetric, no bruits, no thyromegaly LUNGS:  Clear to auscultation bilaterally CHEST:  Unremarkable HEART:  PMI not displaced or sustained,S1 and S2 within normal limits, no S3, no S4, no clicks, no rubs, *** murmurs ABD:  Flat, positive bowel sounds normal in frequency in pitch, no bruits, no rebound, no guarding, no midline pulsatile mass, no hepatomegaly, no splenomegaly EXT:  2 plus pulses throughout, no edema, no cyanosis no clubbing    ***GENERAL:  Well appearing NECK:  No jugular venous distention, waveform within normal limits, carotid upstroke brisk and symmetric, no bruits, no thyromegaly LUNGS:  Clear to auscultation bilaterally CHEST:  Unremarkable HEART:  PMI not displaced or sustained,S1 and S2 within normal limits, no S3, no S4, no clicks, no rubs, 2 out of 6 apical systolic murmur radiating slightly at the outflow tract, 2/6 diastolic murmur murmurs ABD:  Flat, positive bowel sounds normal in frequency in pitch, no bruits, no rebound, no guarding, no midline pulsatile mass, no hepatomegaly, no splenomegaly EXT:  2 plus pulses throughout, no edema, no cyanosis no clubbing   EKG:  EKG is ordered today. The ekg ordered today demonstrates sinus rhythm, rate ***, left axis deviation, poor anterior R wave progression, no acute ST-T wave changes.   Recent Labs: 01/22/2020: TSH 3.093 01/23/2020: ALT 13; Magnesium 1.9 02/05/2020: BUN 46; Creatinine, Ser 10.45; Potassium 4.9;  Sodium 136 02/16/2020: Hemoglobin 8.4; Platelets 197    Lipid Panel    Component Value Date/Time   CHOL 189 10/14/2019 1423   TRIG 119 10/14/2019 1423   HDL 53 10/14/2019 1423   CHOLHDL 3.6 10/14/2019 1423   CHOLHDL 3.3 10/11/2016 1900   VLDL 27 10/11/2016 1900   LDLCALC 115 (H) 10/14/2019 1423      Wt Readings from Last 3 Encounters:  02/09/20 202 lb 6.1 oz (91.8 kg)  01/27/20 195 lb 12.3 oz (88.8 kg)  01/12/20 199 lb (90.3 kg)      Other studies Reviewed: Additional studies/ records that were reviewed today include: ***. Review of the above records demonstrates:  Please see elsewhere in the note.     ASSESSMENT AND PLAN:   Palpitations ***  He has some vague symptoms.  He has had normal thyroid testing and electrolytes.  I am going to apply a 2-week event monitor.  Further management will be based on this.   Anemia: ***  He is waiting to hear from his new primary provider who is planning to do a full panel of labs including CBC.  Atrial fibrillation He would be high risk for anticoagulation.  ***  The artery screening as above.   Essential (primary) hypertension ***  He has had some mildly elevated diastolic pressures with systolics have been normal.  No change in therapy.  CAD (coronary artery disease) *** He is not having any angina.  No change in therapy.  Chronic diastolic CHF (congestive heart failure) (HCC) *** He seems to be euvolemic.  No change in therapy.  AI He has moderate aortic insufficiency.  ***  and I will manage this expectantly.  AORTIC ENLARGEMENT:  ***  He would not be a candidate for repair given his untreated comorbid diseases.    No further imaging is planned.    Current medicines are reviewed at length with the patient today.  The patient does not have concerns regarding medicines.  The following changes have been made:  ***  Labs/ tests ordered today include:     No orders of the defined types were placed in this  encounter.    Disposition:   FU with me ***.   Signed, Minus Breeding, MD  02/17/2020 9:06 PM    [facility]

## 2020-02-17 NOTE — Telephone Encounter (Signed)
-----   Message from [name], DO sent at 02/17/2020  8:38 AM EDT ----- In that case, we will need to recommend that he go to the ED for blood transfusion where they are equipped to deal with COVID+ patients.   ----- Message ----- From: Carylon Perches, CMA Sent: 02/16/2020   4:16 PM EDT To: [name], DO  I called [facility] to try to get patient in for outpatient transfusion was informed that since he is COVID + he would need to go the admission route. They state that they are not equipped to deal see COVID + patients.  Please advise. ----- Message ----- From: [name], DO Sent: 02/16/2020   4:00 PM EDT To: Carylon Perches, CMA   Hi Jeanette Caprice,  See below. Can we work to get [name] [name] set up for outpatient transfusion of 1u PRBC please?  Phill Myron, D.O. Primary Care at [facility]  02/16/2020, 4:01 PM    ----- Message ----- From: Orvan July, NP Sent: 02/16/2020   3:17 PM EDT To: [name], DO  Hello. [name] [name] came in for symptomatic anemia and requesting blood transfusion outpatient. Hgb 8.4 today. He mentioned that you had  set this up for him in the past. He does not want to go back to the ER.   Thank you, Loura Halt FNP

## 2020-02-18 ENCOUNTER — Encounter (HOSPITAL_COMMUNITY): Payer: Self-pay | Admitting: Emergency Medicine

## 2020-02-18 ENCOUNTER — Ambulatory Visit: Payer: Medicare Other | Admitting: Cardiology

## 2020-02-18 ENCOUNTER — Emergency Department (HOSPITAL_COMMUNITY)
Admission: EM | Admit: 2020-02-18 | Discharge: 2020-02-18 | Disposition: A | Discharge status: Home / Self Care | Payer: Medicare Other | Attending: Emergency Medicine | Admitting: Emergency Medicine

## 2020-02-18 ENCOUNTER — Telehealth: Payer: Self-pay | Admitting: Hospice

## 2020-02-18 ENCOUNTER — Other Ambulatory Visit: Payer: Self-pay

## 2020-02-18 DIAGNOSIS — N183 Chronic kidney disease, stage 3 unspecified: Secondary | ICD-10-CM | POA: Diagnosis not present

## 2020-02-18 DIAGNOSIS — Z79899 Other long term (current) drug therapy: Secondary | ICD-10-CM | POA: Insufficient documentation

## 2020-02-18 DIAGNOSIS — J449 Chronic obstructive pulmonary disease, unspecified: Secondary | ICD-10-CM | POA: Diagnosis not present

## 2020-02-18 DIAGNOSIS — I130 Hypertensive heart and chronic kidney disease with heart failure and stage 1 through stage 4 chronic kidney disease, or unspecified chronic kidney disease: Secondary | ICD-10-CM | POA: Insufficient documentation

## 2020-02-18 DIAGNOSIS — I5032 Chronic diastolic (congestive) heart failure: Secondary | ICD-10-CM | POA: Diagnosis not present

## 2020-02-18 DIAGNOSIS — R799 Abnormal finding of blood chemistry, unspecified: Secondary | ICD-10-CM | POA: Diagnosis present

## 2020-02-18 DIAGNOSIS — Z0183 Encounter for blood typing: Secondary | ICD-10-CM | POA: Insufficient documentation

## 2020-02-18 DIAGNOSIS — Z87891 Personal history of nicotine dependence: Secondary | ICD-10-CM | POA: Insufficient documentation

## 2020-02-18 DIAGNOSIS — E1122 Type 2 diabetes mellitus with diabetic chronic kidney disease: Secondary | ICD-10-CM | POA: Diagnosis not present

## 2020-02-18 DIAGNOSIS — D649 Anemia, unspecified: Secondary | ICD-10-CM

## 2020-02-18 DIAGNOSIS — Z5189 Encounter for other specified aftercare: Secondary | ICD-10-CM

## 2020-02-18 LAB — COMPREHENSIVE METABOLIC PANEL
ALT: 12 U/L (ref 0–44)
AST: 17 U/L (ref 15–41)
Albumin: 3.4 g/dL — ABNORMAL LOW (ref 3.5–5.0)
Alkaline Phosphatase: 60 U/L (ref 38–126)
Anion gap: 13 (ref 5–15)
BUN: 61 mg/dL — ABNORMAL HIGH (ref 8–23)
CO2: 29 mmol/L (ref 22–32)
Calcium: 8.5 mg/dL — ABNORMAL LOW (ref 8.9–10.3)
Chloride: 95 mmol/L — ABNORMAL LOW (ref 98–111)
Creatinine, Ser: 10.26 mg/dL — ABNORMAL HIGH (ref 0.61–1.24)
GFR calc Af Amer: 5 mL/min — ABNORMAL LOW (ref >60)
GFR calc non Af Amer: 4 mL/min — ABNORMAL LOW (ref >60)
Glucose, Bld: 128 mg/dL — ABNORMAL HIGH (ref 70–99)
Potassium: 4.4 mmol/L (ref 3.5–5.1)
Sodium: 137 mmol/L (ref 135–145)
Total Bilirubin: 0.5 mg/dL (ref 0.3–1.2)
Total Protein: 6.8 g/dL (ref 6.5–8.1)

## 2020-02-18 LAB — CBC WITH DIFFERENTIAL/PLATELET
Abs Immature Granulocytes: 0.06 10*3/uL (ref 0.00–0.07)
Basophils Absolute: 0 10*3/uL (ref 0.0–0.1)
Basophils Relative: 0 %
Eosinophils Absolute: 0.1 10*3/uL (ref 0.0–0.5)
Eosinophils Relative: 1 %
HCT: 24.6 % — ABNORMAL LOW (ref 39.0–52.0)
Hemoglobin: 7.9 g/dL — ABNORMAL LOW (ref 13.0–17.0)
Immature Granulocytes: 1 %
Lymphocytes Relative: 12 %
Lymphs Abs: 0.9 10*3/uL (ref 0.7–4.0)
MCH: 31.9 pg (ref 26.0–34.0)
MCHC: 32.1 g/dL (ref 30.0–36.0)
MCV: 99.2 fL (ref 80.0–100.0)
Monocytes Absolute: 0.9 10*3/uL (ref 0.1–1.0)
Monocytes Relative: 11 %
Neutro Abs: 5.6 10*3/uL (ref 1.7–7.7)
Neutrophils Relative %: 75 %
Platelets: 164 10*3/uL (ref 150–400)
RBC: 2.48 MIL/uL — ABNORMAL LOW (ref 4.22–5.81)
RDW: 15.2 % (ref 11.5–15.5)
WBC: 7.5 10*3/uL (ref 4.0–10.5)
nRBC: 0 % (ref 0.0–0.2)

## 2020-02-18 LAB — PROTIME-INR
INR: 1 (ref 0.8–1.2)
Prothrombin Time: 12.9 seconds (ref 11.4–15.2)

## 2020-02-18 LAB — PREPARE RBC (CROSSMATCH)

## 2020-02-18 MED ORDER — SODIUM CHLORIDE 0.9 % IV SOLN
10.0000 mL/h | Freq: Once | INTRAVENOUS | Status: AC
  Administered 2020-02-18: 10 mL/h via INTRAVENOUS

## 2020-02-18 NOTE — ED Notes (Signed)
Electronic consent for blood product administration obtained and witnessed by this RN.

## 2020-02-18 NOTE — ED Provider Notes (Signed)
  Physical Exam  BP 137/89   Pulse 63   Temp 98.2 F (36.8 C) (Oral)   Resp 15   Ht 5\' 9"  (1.753 m)   Wt 89.1 kg   SpO2 97%   BMI 29.00 kg/m   Physical Exam Vitals and nursing note reviewed.  Constitutional:      Appearance: Normal appearance.  HENT:     Head: Normocephalic.  Eyes:     Conjunctiva/sclera: Conjunctivae normal.  Pulmonary:     Effort: Pulmonary effort is normal.  Skin:    General: Skin is dry.  Neurological:     Mental Status: He is alert.  Psychiatric:        Mood and Affect: Mood normal.     ED Course/Procedures   Clinical Course as of Feb 18 2336  Fri Feb 18, 2020  1203 I spoke   [AH]  1909 Patient finished transfusion without problems. Patient asking to leave and go home. Previous provider Margarita Mail Pa had also spoken with Dr. Melvia Heaps who agreed with d/c home after transfusion    [KM]    Clinical Course User Index [AH] Margarita Mail, PA-C [KM] Alveria Apley, PA-C    Procedures  MDM  Patient care assumed from [place], [place] due to change of shift.  Please see her note for full HPI.  Briefly, this is a elderly male with history of symptomatic anemia requiring several blood transfusions in the past.  Here with fatigue with resulting hemoglobin of 7.9.  He is getting transfusion for symptomatic anemia and will be clear for discharge home once transfusion complete.       Kristine Royal 02/18/20 2337    Hayden Rasmussen, MD 02/21/20 [phone]

## 2020-02-18 NOTE — ED Triage Notes (Addendum)
Pt reports PCP sent pt to ED for blood transfusion.  Pt with hx of receiving blood transfusions, reports last done beginning of June 2021. Pt is dialysis pt, had dialysis yesterday, only able to tolerate 2h.   AOx4, c/o generalized weakness, "my heart is pounding bc my blood is low."

## 2020-02-18 NOTE — ED Notes (Signed)
Pt provided with [place] sandwich, apple juice per his request.

## 2020-02-18 NOTE — ED Provider Notes (Signed)
[facility] DEPT Provider Note   CSN: [id number] Arrival date & time: 02/18/20  0947     History Chief Complaint  Patient presents with  . Abnormal Lab    [name] [name] is a 74 y.o. 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  He is currently Covid positive as of 2 weeks ago but asymptomatic.  Patient presents because he was told he needed to come in for blood transfusion.  He states his heart has been racing and has been feeling very tired.  He does not feel short of breath more than usual.  He was able to tolerate 2 hours of his dialysis yesterday.  He denies melena or hematochezia  HPI     Past Medical History:  Diagnosis Date  . Abnormal CT of liver    a. nodular contour suggesting cirrhosis 05/2018.  [name] Abnormal LFTs (liver function tests)   . Anxiety   . Aortic root dilatation ([place])   . Ascending aortic aneurysm ([place])   . BPH with urinary obstruction   . Bradycardia   . 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 CAD (coronary artery disease)    a. 12/2015 NSTEMI/Cath (in setting of PAF):  LM nl, LAD 30p,  LCX 61m RCA  ok, AM 100%, RPDA1 40, RPDA 2 60 ->Med Rx.  . Childhood asthma   . Chronic diastolic CHF (congestive heart failure) ([place]    a. 12/2015 Echo: EF 50-55%, mod AI, mod Ao root dil, mild MR, mod dil LA, mod RA.  . CKD (chronic kidney disease), stage III   . Colon cancer ([place]   . COPD (chronic obstructive pulmonary disease) ([place]    pt denies at preop  . Diabetes mellitus type II    diet controlled  . History of syncope   . Hyperlipidemia   . Hypertensive heart disease   . Kidney lump 04/04/2010   Overview:  Renal Cell Carcinoma   . Light chain myeloma (HT[place]   . Moderate aortic insufficiency    a. 12/2015 Echo: Mod AI.  .Marland KitchenParoxysmal atrial fibrillation  (HNahun[place]    a. 12/2015 started on Xarelto (CHA2DS2VASc = 4-5).  . Pneumonia   . Prostate cancer (HA[place]   . Renal cell carcinoma (HPri[place]   . Sleep apnea    Does not like  CPAP  . Spinal stenosis in cervical region 10/12/2016  . Venous insufficiency     Patient Active Problem List   Diagnosis Date Noted  . Symptomatic anemia 02/04/2020  . ESRD (end stage renal disease) ([place] 01/27/2020  . Goals of care, counseling/discussion   . Palliative care by specialist   . DNR (do not resuscitate) discussion   . ARF (acute renal failure) ([place] 01/22/2020  . Ascending aortic aneurysm (HFairview [place] 08/04/2019  . Myeloma kidney (HC[place] Agency   . CKD (chronic kidney disease), stage III 06/04/2019  . Renal cell carcinoma ([place] 06/04/2019  . Gross hematuria 06/04/2019  . COPD exacerbation ([place] 11/05/2018  . Palpitations 10/22/2018  . Counseling regarding advance care planning and goals of care 06/22/2018  . AKI (acute kidney injury) ([place]   . Anemia   . Sepsis (HW[place] 06/05/2018  . Acute lower UTI 06/05/2018  . Colon polyp 05/21/2018  . Aortic valve regurgitation 04/14/2018  . Medication management 04/14/2018  . Thoracic aortic aneurysm without rupture (HCrab [place] 09/29/2017  .  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 Head trauma, subsequent encounter 10/12/2016  . 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 Spinal stenosis in cervical region 10/12/2016  . Cervical spondylosis 10/12/2016  . CAD (coronary artery disease)   . Chronic diastolic CHF (congestive heart failure) ([place])   . Hyperlipidemia   . Hypertensive heart disease   . Paroxysmal atrial fibrillation ([place]) 12/17/2015  . Light chain myeloma ([employer]) 12/17/2015  . Mild intermittent asthma 10/18/2015  . Aortic root dilatation ([place]) 10/17/2015  . Diabetes ([place]) 10/17/2015  . 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 Hand paresthesia 12/09/2014  .  Elevated prostate specific antigen (PSA) 07/12/2014  . Allergic rhinitis 07/12/2014  . LBP (low back pain) 07/12/2014  . Palpitation 07/11/2014  . Patient's other noncompliance with medication regimen 08/10/2013  . Chronic venous insufficiency 07/23/2012  . Obstructive apnea 04/11/2011  . Essential (primary) hypertension 09/07/2010  . 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 Anxiety, generalized 04/04/2010  . Cardiac conduction disorder 02/27/2010  . BPH (benign prostatic hyperplasia) 09/13/2009  . Abnormal findings on examination of genitourinary organs 08/09/2009  . Hereditary and idiopathic neuropathy 07/05/2009    Past Surgical History:  Procedure Laterality Date  . AV FISTULA PLACEMENT Left 01/26/2020   Procedure: left ARTERIOVENOUS (AV) FISTULA CREATION;  Surgeon: Waynetta Sandy, MD;  Location: [facility];  Service: Vascular;  Laterality: Left;  . BACK SURGERY    . CARDIAC CATHETERIZATION N/A 12/18/2015   Procedure: Left Heart Cath and Coronary Angiography;  Surgeon: Leonie Man, MD;  Location: [facility];  Service: Cardiovascular;  Laterality: N/A;  . DIAGNOSTIC LAPAROSCOPY     partial colectomy  . IR FLUORO GUIDE CV LINE RIGHT  06/28/2019  . IR FLUORO GUIDE CV LINE RIGHT  01/24/2020  . IR REMOVAL TUN CV CATH W/O FL  07/26/2019  . IR US GUIDE VASC ACCESS RIGHT  06/28/2019  . IR US GUIDE VASC ACCESS RIGHT  01/24/2020  . LAPAROSCOPIC PARTIAL COLECTOMY Right 05/21/2018   Procedure: LAPAROSCOPIC RIGHT  COLECTOMY ERAS PATHWAY;  Surgeon: Leighton Ruff, MD;  Location: [facility];  Service: General;  Laterality: Right;  . [place]   "replaced a disc"       Family History  Problem Relation Age of Onset  . Emphysema Father   . Asthma Father   . Liver disease Father        tumor  . Heart disease Mother   . Hypertension Mother   . Asthma Sister   . Hypertension Son     Social History   Tobacco Use  . Smoking status: Former  Smoker    Types: Cigars    Quit date: 09/02/1977    Years since quitting: 42.4  . Smokeless tobacco: Never Used  . Tobacco comment:    Vaping Use  . Vaping Use: Never used  Substance Use Topics  . Alcohol use: Not Currently    Comment: "stopped drinking alcohol in ~ 1980; just drank a little on the weekends when I did drink"  . Drug use: No    Home Medications Prior to Admission medications   Medication Sig Start Date End Date Taking? Authorizing Provider  amLODipine (NORVASC) 10 MG tablet Take 1 tablet (10 mg total) by mouth daily. 01/12/20  Yes [name], DO  atorvastatin (LIPITOR) 40 MG tablet Take 40 mg by mouth  daily as needed (pt states he only takes when his cholesterol is high).    Yes [provider]  cetirizine (ZYRTEC) 10 MG tablet TAKE 1 TABLET (10 MG TOTAL) BY MOUTH DAILY AS NEEDED (SEASONAL ALLERGIES). Patient taking differently: Take 10 mg by mouth daily as needed for allergies (seasonal allergies).  11/12/19  Yes [name], DO  Cyanocobalamin (VITAMIN B-12 PO) Take 1 tablet by mouth daily.   Yes [provider]  Omega-3 Fatty Acids (FISH OIL PO) Take 1 capsule by mouth daily.    Yes [provider]  tamsulosin (FLOMAX) 0.4 MG CAPS capsule Take 1 capsule (0.4 mg total) by mouth daily. 10/15/19  Yes [name], DO  glucose blood (ONETOUCH ULTRA) test strip Check FSBS twice a day. Dx: E11.21, N18.32 Patient taking differently: 1 each by Other route See admin instructions. Check FSBS twice a day. Dx: E11.21, N18.32 08/16/19   Fulp, Ander Gaster, MD  HYDROcodone-acetaminophen (NORCO/VICODIN) 5-325 MG tablet Take 1-2 tablets by mouth every 6 (six) hours as needed (gout pain). Patient not taking: Reported on 02/18/2020 12/29/19   Loura Halt A, NP  metoprolol tartrate (LOPRESSOR) 25 MG tablet Take 0.5 tablets (12.5 mg total) by mouth 2 (two) times daily. Patient not taking: Reported on 02/04/2020 01/27/20 04/26/20   [place] Eric J, DO  multivitamin (RENA-VIT) TABS tablet Take 1 tablet by mouth at bedtime. Patient not taking: Reported on 02/18/2020 01/27/20 04/26/20  [place] Eric J, DO    Allergies    Amoxicillin and Penicillins  Review of Systems   Review of Systems Ten systems reviewed and are negative for acute change, except as noted in the HPI.   Physical Exam Updated Vital Signs BP (!) 162/93   Pulse 80   Temp 98.3 F (36.8 C)   Resp 19   Ht 5' 9"  (1.753 m)   Wt 89.1 kg   SpO2 98%   BMI 29.00 kg/m   Physical Exam Vitals and nursing note reviewed.  Constitutional:      General: He is not in acute distress.    Appearance: He is well-developed. He is not diaphoretic.  HENT:     Head: Normocephalic and atraumatic.  Eyes:     General: No scleral icterus.    Conjunctiva/sclera: Conjunctivae normal.  Cardiovascular:     Rate and Rhythm: Normal rate and regular rhythm.     Heart sounds: Normal heart sounds.  Pulmonary:     Effort: Pulmonary effort is normal. No respiratory distress.     Breath sounds: Normal breath sounds.  Chest:     Comments: Dialysis catheter in place in the right subclavicular region Abdominal:     Palpations: Abdomen is soft.     Tenderness: There is no abdominal tenderness.  Musculoskeletal:     Cervical back: Normal range of motion and neck supple.  Skin:    General: Skin is warm and dry.  Neurological:     Mental Status: He is alert.  Psychiatric:        Behavior: Behavior normal.     ED Results / Procedures / Treatments   Labs (all labs ordered are listed, but only abnormal results are displayed) Labs Reviewed  COMPREHENSIVE METABOLIC PANEL - Abnormal; Notable for the following components:      Result Value   Chloride 95 (*)    Glucose, Bld 128 (*)    BUN 61 (*)    Creatinine, Ser 10.26 (*)    Calcium 8.5 (*)  Albumin 3.4 (*)    GFR calc non Af Amer 4 (*)    GFR calc Af Amer 5 (*)    All other components within normal limits  CBC WITH  DIFFERENTIAL/PLATELET - Abnormal; Notable for the following components:   RBC 2.48 (*)    Hemoglobin 7.9 (*)    HCT 24.6 (*)    All other components within normal limits  PROTIME-INR  TYPE AND SCREEN  PREPARE RBC (CROSSMATCH)    EKG EKG Interpretation  Date/Time:  Friday February 18 2020 10:19:27 EDT Ventricular Rate:  79 PR Interval:    QRS Duration: 104 QT Interval:  419 QTC Calculation: 468 R Axis:   -54 Text Interpretation: Sinus rhythm Atrial premature complexes LAD, consider left anterior fascicular block Borderline T abnormalities, inferior leads Confirmed by Lacretia Leigh ([id number]) on 02/18/2020 11:09:50 AM   Radiology No results found.  Procedures .Critical Care Performed by: Margarita Mail, PA-C Authorized by: Margarita Mail, PA-C   Critical care provider statement:    Critical care time (minutes):  45   Critical care time was exclusive of:  Separately billable procedures and treating other patients   Critical care was necessary to treat or prevent imminent or life-threatening deterioration of the following conditions:  Circulatory failure   Critical care was time spent personally by me on the following activities:  Discussions with consultants, evaluation of patient's response to treatment, examination of patient, ordering and performing treatments and interventions, ordering and review of laboratory studies, ordering and review of radiographic studies, pulse oximetry, re-evaluation of patient's condition, obtaining history from patient or surrogate and review of old charts   (including critical care time)  Medications Ordered in ED Medications  0.9 %  sodium chloride infusion (0 mL/hr Intravenous Stopped 02/18/20 1935)    ED Course  I have reviewed the triage vital signs and the nursing notes.  Pertinent labs & imaging results that were available during my care of the patient were reviewed by me and considered in my medical decision making (see chart for  details).  Clinical Course as of Feb 18 1026  Fri Feb 18, 2020  1203 I spoke with Dr. Jonnie Finner who says that it is okay for the patient to receive a transfusion today and go to dialysis tomorrow.   [AH]  1909 Patient finished transfusion without problems. Patient asking to leave and go home. Previous provider Margarita Mail Pa had also spoken with Dr. Melvia Heaps who agreed with d/c home after transfusion    [KM]    Clinical Course User Index [AH] Margarita Mail, PA-C [KM] Kristine Royal   MDM Rules/Calculators/A&P                          74 year old male here with low hemoglobin level.  He has a history of multiple myeloma and end-stage renal disease.  He has required blood transfusions in the past.  He feels his heart racing and is feeling more winded and short of breath.  He denies volume overload.  I ordered and reviewed labs which include PT/INR is within normal limits, CBC shows hemoglobin of 7.9, CMP with elevated BUN and creatinine consistent with end-stage renal disease.  Type and screen has been completed with a positive blood.  I discussed case with Dr. Burnett Sheng who feels it is fine for him to go ahead and get R[facility] fusion here in the emergency department and be discharged given he had dialysis yesterday.  Patient does  not appear volume overloaded.  KG shows sinus rhythm at a rate of 89.  I have discussed the case with PA Aundra Dubin who will assume care of the patient with expected discharge with completion of his blood transfusion.    Final Clinical Impression(s) / ED Diagnoses Final diagnoses:  Symptomatic anemia  Encounter for blood transfusion    Rx / DC Orders ED Discharge Orders    None       Margarita Mail, PA-C 02/19/20 1029    Lacretia Leigh, MD 02/20/20 1954

## 2020-02-18 NOTE — Telephone Encounter (Signed)
Called patient to f/u with him to see if he wanted to schedule Palliative Consult with no answer and unable to leave a message due to mailbox was full.  I noticed that when signing into Epic that he has been admitted to [facility], will notify Liaison Team.

## 2020-02-18 NOTE — ED Provider Notes (Signed)
Medical screening examination/treatment/procedure(s) were conducted as a shared visit with non-physician practitioner(s) and myself.  I personally evaluated the patient during the encounter.  EKG Interpretation  Date/Time:  Friday February 18 2020 10:19:27 EDT Ventricular Rate:  79 PR Interval:    QRS Duration: 104 QT Interval:  419 QTC Calculation: 468 R Axis:   -54 Text Interpretation: Sinus rhythm Atrial premature complexes LAD, consider left anterior fascicular block Borderline T abnormalities, inferior leads Confirmed by Lacretia Leigh ([id number]) on 02/18/2020 11:19:17 AM 74 year old male here with symptomatic anemia.  Denies any bloody stools.  Patient's hemoglobin here is 7.9 we will give 1 unit of blood and reassessed   Lacretia Leigh, MD 02/18/20 1257

## 2020-02-18 NOTE — Discharge Instructions (Addendum)
Get help right away if: You have symptoms of a serious allergic or immune system reaction, including: Trouble breathing or shortness of breath. Swelling of the face or feeling flushed. Fever or chills. Pain in the head, back, or chest. Dark urine or blood in the urine. Widespread rash. Fast heartbeat. Feeling dizzy or light-headed.

## 2020-02-20 LAB — BPAM RBC
Blood Product Expiration Date: 202107062359
ISSUE DATE / TIME: 202106181555
Unit Type and Rh: 6200

## 2020-02-20 LAB — TYPE AND SCREEN
ABO/RH(D): A POS
Antibody Screen: NEGATIVE
Unit division: 0

## 2020-02-21 ENCOUNTER — Telehealth: Payer: Self-pay | Admitting: Hospice

## 2020-02-21 ENCOUNTER — Other Ambulatory Visit: Payer: Self-pay | Admitting: *Deleted

## 2020-02-21 DIAGNOSIS — N186 End stage renal disease: Secondary | ICD-10-CM

## 2020-02-21 NOTE — Telephone Encounter (Signed)
Called patient back to offer to schedule the Palliative Consult, spoke with daughter and she said that patent wasn't at home and should be back in about an hour.  She took my name and number and will have him to call me back when he gets home.

## 2020-02-22 ENCOUNTER — Telehealth: Payer: Self-pay | Admitting: Hospice

## 2020-02-22 NOTE — Telephone Encounter (Signed)
Called patient to schedule the Palliative Consult, he stated that he was at dialysis now and said that I could call him back around lunch time.

## 2020-02-23 ENCOUNTER — Ambulatory Visit: Payer: Medicare Other | Admitting: Hematology

## 2020-02-23 ENCOUNTER — Telehealth: Payer: Self-pay | Admitting: Hematology

## 2020-02-23 ENCOUNTER — Telehealth: Payer: Self-pay | Admitting: Hospice

## 2020-02-23 ENCOUNTER — Inpatient Hospital Stay: Payer: Medicare Other

## 2020-02-23 NOTE — Telephone Encounter (Signed)
Schedule per los, patient has been called and no voicemail has been set up.

## 2020-02-23 NOTE — Telephone Encounter (Signed)
Spoke with patient and have scheduled an In-home Palliative Consult for 03/06/20 @ 1 PM

## 2020-02-24 DIAGNOSIS — I7789 Other specified disorders of arteries and arterioles: Secondary | ICD-10-CM | POA: Insufficient documentation

## 2020-02-24 NOTE — Progress Notes (Signed)
Cardiology Office Note   Date:  02/25/2020   ID:  [name], [name] 11/08/1945, MRN [id number]  PCP:  [name] Bang, DO  Cardiologist:   [name] Breeding, MD   Chief Complaint  Patient presents with  . Palpitations      History of Present Illness: [name] [name] is a 74 y.o. male who presents for evaluation of palpitations and CAD.  He was in the hospital in October.  He had acute on chronic renal insufficiency.  He was being managed for hematuria and was having trouble with urination.  He has had multiple myeloma and renal cell cancer.  He decided to stop all future oncology treatment.  He refused hospice.  He had his dialysis catheter removed.  He did however follow up with an echo and had moderate AI and mild MR.  He had aortic root enlargement.    At the last visit he had increased weakness.   He had palpitations.  Since I last saw him he was admitted for symptomatic anemia likely secondary to renal disease.  He also had Covid.   He was in the ED yesterday  6/18  for fatigue.  There were no acute findings.  .I reviewed both of these records for this visit.  He received 1 unit of blood.    From a cardiac standpoint he really does not feel much other than the fact that he thinks his heart is working too hard because he knows he still anemic.  He says he should have gotten 2 units of blood.  He is not really having any new chest pressure, neck or arm discomfort.  Is not having any new palpitations, presyncope or syncope.  He has had no PND or orthopnea.  He has a dry nonproductive cough.  Of note he did test positive for Covid 3 weeks ago although he was negative on 617.  He really does not believe he had Covid.  Past Medical History:  Diagnosis Date  . Abnormal CT of liver    a. nodular contour suggesting cirrhosis 05/2018.  [name] Abnormal LFTs (liver function tests)   . Anxiety   . Aortic root dilatation ([place])   . Ascending aortic aneurysm ([place])   . BPH with urinary  obstruction   . Bradycardia   . 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 CAD (coronary artery disease)    a. 12/2015 NSTEMI/Cath (in setting of PAF):  LM nl, LAD 30p,  LCX 88m RCA  ok, AM 100%, RPDA1 40, RPDA 2 60 ->Med Rx.  . Childhood asthma   . Chronic diastolic CHF (congestive heart failure) ([place]    a. 12/2015 Echo: EF 50-55%, mod AI, mod Ao root dil, mild MR, mod dil LA, mod RA.  . CKD (chronic kidney disease), stage III   . Colon cancer ([place]   . COPD (chronic obstructive pulmonary disease) ([place]    pt denies at preop  . Diabetes mellitus type II    diet controlled  . History of syncope   . Hyperlipidemia   . Hypertensive heart disease   . Kidney lump 04/04/2010   Overview:  Renal Cell Carcinoma   . Light chain myeloma ([place]   . Moderate aortic insufficiency    a. 12/2015 Echo: Mod AI.  .Marland KitchenParoxysmal atrial fibrillation ([place]    a. 12/2015 started on Xarelto (CHA2DS2VASc = 4-5).  . Pneumonia   . Prostate  cancer ([place])   . Renal cell carcinoma ([place])   . Sleep apnea    Does not like  CPAP  . Spinal stenosis in cervical region 10/12/2016  . Venous insufficiency     Past Surgical History:  Procedure Laterality Date  . AV FISTULA PLACEMENT Left 01/26/2020   Procedure: left ARTERIOVENOUS (AV) FISTULA CREATION;  Surgeon: Waynetta Sandy, MD;  Location: [facility];  Service: Vascular;  Laterality: Left;  . BACK SURGERY    . CARDIAC CATHETERIZATION N/A 12/18/2015   Procedure: Left Heart Cath and Coronary Angiography;  Surgeon: Leonie Man, MD;  Location: [facility];  Service: Cardiovascular;  Laterality: N/A;  . DIAGNOSTIC LAPAROSCOPY     partial colectomy  . IR FLUORO GUIDE CV LINE RIGHT  06/28/2019  . IR FLUORO GUIDE CV LINE RIGHT  01/24/2020  . IR REMOVAL TUN CV CATH W/O FL  07/26/2019  . IR US GUIDE VASC ACCESS RIGHT  06/28/2019  . IR US GUIDE VASC ACCESS RIGHT  01/24/2020  .  LAPAROSCOPIC PARTIAL COLECTOMY Right 05/21/2018   Procedure: LAPAROSCOPIC RIGHT  COLECTOMY ERAS PATHWAY;  Surgeon: Leighton Ruff, MD;  [facility];  Service: General;  Laterality: Right;  . [place]   "replaced a disc"     Current Outpatient Medications  Medication Sig Dispense Refill  . amLODipine (NORVASC) 10 MG tablet Take 1 tablet (10 mg total) by mouth daily. 90 tablet 1  . atorvastatin (LIPITOR) 40 MG tablet Take 40 mg by mouth daily as needed (pt states he only takes when his cholesterol is high).     . cetirizine (ZYRTEC) 10 MG tablet TAKE 1 TABLET (10 MG TOTAL) BY MOUTH DAILY AS NEEDED (SEASONAL ALLERGIES). (Patient taking differently: Take 10 mg by mouth daily as needed for allergies (seasonal allergies). ) 90 tablet 3  . Cyanocobalamin (VITAMIN B-12 PO) Take 1 tablet by mouth daily.    [name] glucose blood (ONETOUCH ULTRA) test strip Check FSBS twice a day. Dx: E11.21, N18.32 (Patient taking differently: 1 each by Other route See admin instructions. Check FSBS twice a day. Dx: E11.21, N18.32) 100 each 3  . HYDROcodone-acetaminophen (NORCO/VICODIN) 5-325 MG tablet Take 1-2 tablets by mouth every 6 (six) hours as needed (gout pain). 12 tablet 0  . metoprolol tartrate (LOPRESSOR) 25 MG tablet Take 0.5 tablets (12.5 mg total) by mouth 2 (two) times daily. 90 tablet 0  . multivitamin (RENA-VIT) TABS tablet Take 1 tablet by mouth at bedtime. 90 tablet 0  . Omega-3 Fatty Acids (FISH OIL PO) Take 1 capsule by mouth daily.     . tamsulosin (FLOMAX) 0.4 MG CAPS capsule Take 1 capsule (0.4 mg total) by mouth daily. 90 capsule 0   No current facility-administered medications for this visit.    Allergies:   Amoxicillin and Penicillins    ROS:  Please see the history of present illness.   Otherwise, review of systems are positive for none.   All other systems are reviewed and negative.    PHYSICAL EXAM: VS:  BP (!) 154/86   Pulse 81   Temp 97.9 F (36.6 C)   Ht 5' 9"   (1.753 m)   Wt 213 lb (96.6 kg)   SpO2 96%   BMI 31.45 kg/m  , BMI Body mass index is 31.45 kg/m. GENERAL:  Well appearing NECK:  No jugular venous distention, waveform within normal limits, carotid upstroke brisk and symmetric, no bruits, no thyromegaly LUNGS: Few scattered rhonchi CHEST:  Unremarkable HEART:  PMI not displaced or sustained,S1 and S2 within normal limits, no S3, no S4, no clicks, no rubs, 2 out of 6 apical systolic murmur radiating slightly at the aortic outflow tract, no diastolic murmurs ABD:  Flat, positive bowel sounds normal in frequency in pitch, no bruits, no rebound, no guarding, no midline pulsatile mass, no hepatomegaly, no splenomegaly EXT:  2 plus pulses throughout, no edema, no cyanosis no clubbing  EKG:  EKG is ordered today. The ekg ordered today demonstrates sinus rhythm, rate 79, left axis deviation, poor anterior R wave progression, no acute ST-T wave changes.  PACs.    Recent Labs: 01/22/2020: TSH 3.093 01/23/2020: Magnesium 1.9 02/18/2020: ALT 12; BUN 61; Creatinine, Ser 10.26; Hemoglobin 7.9; Platelets 164; Potassium 4.4; Sodium 137    Lipid Panel    Component Value Date/Time   CHOL 189 10/14/2019 1423   TRIG 119 10/14/2019 1423   HDL 53 10/14/2019 1423   CHOLHDL 3.6 10/14/2019 1423   CHOLHDL 3.3 10/11/2016 1900   VLDL 27 10/11/2016 1900   LDLCALC 115 (H) 10/14/2019 1423      Wt Readings from Last 3 Encounters:  02/25/20 213 lb (96.6 kg)  02/18/20 196 lb 6.4 oz (89.1 kg)  02/09/20 202 lb 6.1 oz (91.8 kg)      Other studies Reviewed: Additional studies/ records that were reviewed today include: ED records. Review of the above records demonstrates:  Please see elsewhere in the note.     ASSESSMENT AND PLAN:   Palpitations He does have some palpitations possibly related to some anemia but he has had no significant arrhythmias on the recent monitor.  He is not having any presyncope or syncope.  No change in therapy or further  work-up.  Anemia: I will defer to his primary providers.  He has had requirements for blood transfusions in the past.  He is not describing any active bleeding.   Atrial fibrillation He would be high risk for anticoagulation.    Essential (primary) hypertension Blood pressures controlled although sometimes mildly elevated.  He goes down with dialysis.  No change in therapy.   CAD (coronary artery disease) She is not having active chest discomfort.  No change in therapy.   Chronic diastolic CHF (congestive heart failure) ([place]) This is managed with dialysis.   AI He has moderate aortic insufficiency.   I will manage this conservatively as he would not be a good surgical candidate.  Probably follow-up with an echo late next year.  He is still doing well from a mood standpoint.    Aortic enlargement:  He would not be a candidate for surgical repair.  Current medicines are reviewed at length with the patient today.  The patient does not have concerns regarding medicines.  The following changes have been made:  None  Labs/ tests ordered today include:     Orders Placed This Encounter  Procedures  . EKG 12-Lead     Disposition:   FU with APP in six months.   Signed, [name] Breeding, MD  02/25/2020 2:35 PM    [place]

## 2020-02-25 ENCOUNTER — Ambulatory Visit (INDEPENDENT_AMBULATORY_CARE_PROVIDER_SITE_OTHER): Payer: Medicare Other | Admitting: Cardiology

## 2020-02-25 ENCOUNTER — Other Ambulatory Visit: Payer: Self-pay

## 2020-02-25 ENCOUNTER — Telehealth: Payer: Self-pay | Admitting: Internal Medicine

## 2020-02-25 ENCOUNTER — Emergency Department (HOSPITAL_COMMUNITY)
Admission: EM | Admit: 2020-02-25 | Discharge: 2020-02-26 | Disposition: A | Discharge status: Home / Self Care | Payer: Medicare Other | Attending: Emergency Medicine | Admitting: Emergency Medicine

## 2020-02-25 ENCOUNTER — Encounter: Payer: Self-pay | Admitting: Cardiology

## 2020-02-25 ENCOUNTER — Encounter (HOSPITAL_COMMUNITY): Payer: Self-pay | Admitting: Emergency Medicine

## 2020-02-25 VITALS — BP 154/86 | HR 81 | Temp 97.9°F | Ht 69.0 in | Wt 213.0 lb

## 2020-02-25 DIAGNOSIS — Z7189 Other specified counseling: Secondary | ICD-10-CM

## 2020-02-25 DIAGNOSIS — D631 Anemia in chronic kidney disease: Secondary | ICD-10-CM | POA: Insufficient documentation

## 2020-02-25 DIAGNOSIS — J449 Chronic obstructive pulmonary disease, unspecified: Secondary | ICD-10-CM | POA: Diagnosis not present

## 2020-02-25 DIAGNOSIS — R002 Palpitations: Secondary | ICD-10-CM

## 2020-02-25 DIAGNOSIS — Z79899 Other long term (current) drug therapy: Secondary | ICD-10-CM | POA: Diagnosis not present

## 2020-02-25 DIAGNOSIS — D649 Anemia, unspecified: Secondary | ICD-10-CM

## 2020-02-25 DIAGNOSIS — N186 End stage renal disease: Secondary | ICD-10-CM | POA: Diagnosis not present

## 2020-02-25 DIAGNOSIS — Z87891 Personal history of nicotine dependence: Secondary | ICD-10-CM | POA: Insufficient documentation

## 2020-02-25 DIAGNOSIS — I5032 Chronic diastolic (congestive) heart failure: Secondary | ICD-10-CM | POA: Diagnosis not present

## 2020-02-25 DIAGNOSIS — I132 Hypertensive heart and chronic kidney disease with heart failure and with stage 5 chronic kidney disease, or end stage renal disease: Secondary | ICD-10-CM | POA: Diagnosis not present

## 2020-02-25 DIAGNOSIS — I2510 Atherosclerotic heart disease of native coronary artery without angina pectoris: Secondary | ICD-10-CM | POA: Diagnosis not present

## 2020-02-25 DIAGNOSIS — R531 Weakness: Secondary | ICD-10-CM

## 2020-02-25 DIAGNOSIS — I480 Paroxysmal atrial fibrillation: Secondary | ICD-10-CM

## 2020-02-25 DIAGNOSIS — I351 Nonrheumatic aortic (valve) insufficiency: Secondary | ICD-10-CM

## 2020-02-25 DIAGNOSIS — I7789 Other specified disorders of arteries and arterioles: Secondary | ICD-10-CM

## 2020-02-25 DIAGNOSIS — Z992 Dependence on renal dialysis: Secondary | ICD-10-CM | POA: Insufficient documentation

## 2020-02-25 DIAGNOSIS — D638 Anemia in other chronic diseases classified elsewhere: Secondary | ICD-10-CM

## 2020-02-25 LAB — CBC
HCT: 22.4 % — ABNORMAL LOW (ref 39.0–52.0)
Hemoglobin: 7.2 g/dL — ABNORMAL LOW (ref 13.0–17.0)
MCH: 29.9 pg (ref 26.0–34.0)
MCHC: 32.1 g/dL (ref 30.0–36.0)
MCV: 92.9 fL (ref 80.0–100.0)
Platelets: 151 10*3/uL (ref 150–400)
RBC: 2.41 MIL/uL — ABNORMAL LOW (ref 4.22–5.81)
RDW: 16.7 % — ABNORMAL HIGH (ref 11.5–15.5)
WBC: 6 10*3/uL (ref 4.0–10.5)
nRBC: 0 % (ref 0.0–0.2)

## 2020-02-25 LAB — URINALYSIS, ROUTINE W REFLEX MICROSCOPIC
Bilirubin Urine: NEGATIVE
Glucose, UA: NEGATIVE mg/dL
Ketones, ur: NEGATIVE mg/dL
Leukocytes,Ua: NEGATIVE
Nitrite: NEGATIVE
Protein, ur: 100 mg/dL — AB
Specific Gravity, Urine: 1.006 (ref 1.005–1.030)
pH: 8 (ref 5.0–8.0)

## 2020-02-25 MED ORDER — SODIUM CHLORIDE 0.9% FLUSH: 3.0000 mL | Freq: Once | INTRAVENOUS | Status: DC

## 2020-02-25 NOTE — ED Triage Notes (Signed)
Pt presents to ED from home BIB [facility]. Pt c/o his "heart pounding" and weakness. Pt reports feeling weak all over. Pt dialysis t/th/s. Normal dialysis on thurs. Pt says last time he felt like this hmg was low and he had to get blood.

## 2020-02-25 NOTE — Patient Instructions (Signed)
Medication Instructions:  Your physician recommends that you continue on your current medications as directed. Please refer to the Current Medication list given to you today.  *If you need a refill on your cardiac medications before your next appointment, please call your pharmacy*   Lab Work: NONE   Testing/Procedures: NONE   Follow-Up: At [facility], you and your health needs are our priority.  As part of our continuing mission to provide you with exceptional heart care, we have created designated Provider Care Teams.  These Care Teams include your primary Cardiologist (physician) and Advanced Practice Providers (APPs -  Physician Assistants and Nurse Practitioners) who all work together to provide you with the care you need, when you need it.  We recommend signing up for the patient portal called "MyChart".  Sign up information is provided on this After Visit Summary.  MyChart is used to connect with patients for Virtual Visits (Telemedicine).  Patients are able to view lab/test results, encounter notes, upcoming appointments, etc.  Non-urgent messages can be sent to your provider as well.   To learn more about what you can do with MyChart, go to [email or web address].    Your next appointment:   6 month(s)  The format for your next appointment:   In Person  Provider:   WITH PA/NP

## 2020-02-25 NOTE — Telephone Encounter (Signed)
1) Medication(s) Requested (by name):HYDROcodone-acetaminophen (NORCO/VICODIN) 5-325 MG tablet [[id number]]    2) Pharmacy of Choice: [id number] - [place], [place] - [address]  [address], [place]  3) Special Requests:   Approved medications will be sent to the pharmacy, we will reach out if there is an issue.  Requests made after 3pm may not be addressed until the following business day!  If a patient is unsure of the name of the medication(s) please note and ask patient to call back when they are able to provide all info, do not send to responsible party until all information is available!

## 2020-02-26 ENCOUNTER — Emergency Department (HOSPITAL_COMMUNITY): Payer: Medicare Other

## 2020-02-26 DIAGNOSIS — N186 End stage renal disease: Secondary | ICD-10-CM | POA: Diagnosis not present

## 2020-02-26 LAB — BASIC METABOLIC PANEL
Anion gap: 14 (ref 5–15)
BUN: 44 mg/dL — ABNORMAL HIGH (ref 8–23)
CO2: 27 mmol/L (ref 22–32)
Calcium: 9.2 mg/dL (ref 8.9–10.3)
Chloride: 92 mmol/L — ABNORMAL LOW (ref 98–111)
Creatinine, Ser: 10.93 mg/dL — ABNORMAL HIGH (ref 0.61–1.24)
GFR calc Af Amer: 5 mL/min — ABNORMAL LOW (ref >60)
GFR calc non Af Amer: 4 mL/min — ABNORMAL LOW (ref >60)
Glucose, Bld: 101 mg/dL — ABNORMAL HIGH (ref 70–99)
Potassium: 4.6 mmol/L (ref 3.5–5.1)
Sodium: 133 mmol/L — ABNORMAL LOW (ref 135–145)

## 2020-02-26 LAB — PREPARE RBC (CROSSMATCH)

## 2020-02-26 LAB — CBG MONITORING, ED: Glucose-Capillary: 71 mg/dL (ref 70–99)

## 2020-02-26 MED ORDER — ACETAMINOPHEN 325 MG PO TABS
650.0000 mg | ORAL_TABLET | Freq: Once | ORAL | Status: AC
  Administered 2020-02-26: 650 mg via ORAL
  Filled 2020-02-26: qty 2

## 2020-02-26 MED ORDER — FUROSEMIDE 10 MG/ML IJ SOLN
40.0000 mg | Freq: Once | INTRAMUSCULAR | Status: AC
  Administered 2020-02-26: 40 mg via INTRAVENOUS
  Filled 2020-02-26: qty 4

## 2020-02-26 MED ORDER — SODIUM CHLORIDE 0.9 % IV SOLN
10.0000 mL/h | Freq: Once | INTRAVENOUS | Status: AC
  Administered 2020-02-26: 10 mL/h via INTRAVENOUS

## 2020-02-26 MED ORDER — ENSURE MAX PROTEIN PO LIQD
11.0000 [oz_av] | Freq: Every day | ORAL | Status: DC
  Administered 2020-02-26: 11 [oz_av] via ORAL
  Filled 2020-02-26 (×2): qty 330

## 2020-02-26 MED ORDER — FENTANYL CITRATE (PF) 100 MCG/2ML IJ SOLN
50.0000 ug | Freq: Once | INTRAMUSCULAR | Status: AC
  Administered 2020-02-26: 50 ug via INTRAVENOUS
  Filled 2020-02-26: qty 2

## 2020-02-26 MED ORDER — ACETAMINOPHEN 500 MG PO TABS
500.0000 mg | ORAL_TABLET | Freq: Once | ORAL | Status: AC
  Administered 2020-02-26: 500 mg via ORAL
  Filled 2020-02-26: qty 1

## 2020-02-26 NOTE — ED Provider Notes (Signed)
74 yo male mm presents with anemia esrd on dialysis, exertional dyspnea, weakness and is receiving 2 prbc here. 4:36 PM Patient received 2 units of packed red blood cells here today.  He states he is feeling much better.  Lungs revealed clear to auscultation.  Plan discharged home   Pattricia Boss, MD 02/26/20 [phone]

## 2020-02-26 NOTE — ED Notes (Signed)
Meal ordered and protein drink ordered from pharmacy

## 2020-02-26 NOTE — ED Provider Notes (Signed)
Emergency Department Provider Note  I have reviewed the triage vital signs and the nursing notes.  HISTORY  Chief Complaint Weakness and Fatigue   HPI [name] [name] is a 74 y.o. male who presents the emergency department today secondary to generalized weakness.  Patient states he has a history of multiple myeloma, end-stage renal disease on dialysis and chronic anemia likely secondary to his kidney failure and has had problems with this in the past.  Patient states he most recently got a transfusion about a week ago but feels worse again now.  No other associated symptoms.  No fevers.  Is slightly lightheaded and dyspneic with exertion.   No other associated or modifying symptoms.    Past Medical History:  Diagnosis Date  . Abnormal CT of liver    a. nodular contour suggesting cirrhosis 05/2018.  [name] Abnormal LFTs (liver function tests)   . Anxiety   . Aortic root dilatation ([place])   . Ascending aortic aneurysm ([place])   . BPH with urinary obstruction   . Bradycardia   . 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 CAD (coronary artery disease)    a. 12/2015 NSTEMI/Cath (in setting of PAF):  LM nl, LAD 30p,  LCX 68m RCA  ok, AM 100%, RPDA1 40, RPDA 2 60 ->Med Rx.  . Childhood asthma   . Chronic diastolic CHF (congestive heart failure) ([facility]    a. 12/2015 Echo: EF 50-55%, mod AI, mod Ao root dil, mild MR, mod dil LA, mod RA.  . CKD (chronic kidney disease), stage III   . Colon cancer ([facility] [place]   . COPD (chronic obstructive pulmonary disease) ([facility]    pt denies at preop  . Diabetes mellitus type II    diet controlled  . History of syncope   . Hyperlipidemia   . Hypertensive heart disease   . Kidney lump 04/04/2010   Overview:  Renal Cell Carcinoma   . Light chain myeloma ([facility]   . Moderate aortic insufficiency    a. 12/2015 Echo: Mod AI.  .Marland KitchenParoxysmal atrial fibrillation (HW[place]    a. 12/2015 started on  Xarelto (CHA2DS2VASc = 4-5).  . Pneumonia   . Prostate cancer ([facility]   . Renal cell carcinoma ([place]   . Sleep apnea    Does not like  CPAP  . Spinal stenosis in cervical region 10/12/2016  . Venous insufficiency     Patient Active Problem List   Diagnosis Date Noted  . Aortic root enlargement ([facility] 02/24/2020  . Symptomatic anemia 02/04/2020  . ESRD (end stage renal disease) ([place] [place] 01/27/2020  . Goals of care, counseling/discussion   . Palliative care by specialist   . DNR (do not resuscitate) discussion   . ARF (acute renal failure) (HD[place] 01/22/2020  . Ascending aortic aneurysm (HBeth[place] 08/04/2019  . Myeloma kidney ([facility]   . CKD (chronic kidney disease), stage III 06/04/2019  . Renal cell carcinoma ([facility] 06/04/2019  . Gross hematuria 06/04/2019  . COPD exacerbation (HH[place] 11/05/2018  . Palpitations 10/22/2018  . Counseling regarding advance care planning and goals of care 06/22/2018  . AKI (acute kidney injury) ([facility]   . Anemia   . Sepsis ([facility] [place] 06/05/2018  . Acute lower UTI 06/05/2018  . Colon polyp 05/21/2018  . Aortic valve regurgitation 04/14/2018  . Medication management 04/14/2018  . Thoracic aortic aneurysm without rupture (HCole 09/29/2017  . C5-C7 level with spinal cord injury with central  cord syndrome, without evidence of spinal bone injury ([place]) 10/12/2016  . Head trauma, subsequent encounter 10/12/2016  . 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 Spinal stenosis in cervical region 10/12/2016  . Cervical spondylosis 10/12/2016  . CAD (coronary artery disease)   . Chronic diastolic CHF (congestive heart failure) ([place])   . Hyperlipidemia   . Hypertensive heart disease   . Paroxysmal atrial fibrillation ([place]) 12/17/2015  . Light chain myeloma ([place]) 12/17/2015  . Mild intermittent asthma 10/18/2015  . Aortic root dilatation ([place]) 10/17/2015  . Diabetes ([place]) 10/17/2015  . 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 Hand paresthesia  12/09/2014  . Elevated prostate specific antigen (PSA) 07/12/2014  . Allergic rhinitis 07/12/2014  . LBP (low back pain) 07/12/2014  . Palpitation 07/11/2014  . Patient's other noncompliance with medication regimen 08/10/2013  . Chronic venous insufficiency 07/23/2012  . Obstructive apnea 04/11/2011  . Essential (primary) hypertension 09/07/2010  . 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 Anxiety, generalized 04/04/2010  . Cardiac conduction disorder 02/27/2010  . BPH (benign prostatic hyperplasia) 09/13/2009  . Abnormal findings on examination of genitourinary organs 08/09/2009  . Hereditary and idiopathic neuropathy 07/05/2009    Past Surgical History:  Procedure Laterality Date  . AV FISTULA PLACEMENT Left 01/26/2020   Procedure: left ARTERIOVENOUS (AV) FISTULA CREATION;  Surgeon: Waynetta Sandy, MD;  Location: [facility];  Service: Vascular;  Laterality: Left;  . BACK SURGERY    . CARDIAC CATHETERIZATION N/A 12/18/2015   Procedure: Left Heart Cath and Coronary Angiography;  Surgeon: Leonie Man, MD;  Location: [facility];  Service: Cardiovascular;  Laterality: N/A;  . DIAGNOSTIC LAPAROSCOPY     partial colectomy  . IR FLUORO GUIDE CV LINE RIGHT  06/28/2019  . IR FLUORO GUIDE CV LINE RIGHT  01/24/2020  . IR REMOVAL TUN CV CATH W/O FL  07/26/2019  . IR US GUIDE VASC ACCESS RIGHT  06/28/2019  . IR US GUIDE VASC ACCESS RIGHT  01/24/2020  . LAPAROSCOPIC PARTIAL COLECTOMY Right 05/21/2018   Procedure: LAPAROSCOPIC RIGHT  COLECTOMY ERAS PATHWAY;  Surgeon: Leighton Ruff, MD;  Location: [facility];  Service: General;  Laterality: Right;  . [place]   "replaced a disc"    Current Outpatient Rx  . Order #: [id number] Class: Normal  . Order #: [id number] Class: Historical Med  . Order #: [id number] Class: Normal  . Order #: [id number] Class: Historical Med  . Order #: [id number] Class: Normal  . Order #: [id number] Class: Historical Med  . Order #:  [id number] Class: Normal  . Order #: [id number] Class: Normal  . Order #: [id number] Class: Normal  . Order #: [id number] Class: Normal    Allergies Amoxicillin and Penicillins  Family History  Problem Relation Age of Onset  . Emphysema Father   . Asthma Father   . Liver disease Father        tumor  . Heart disease Mother   . Hypertension Mother   . Asthma Sister   . Hypertension Son     Social History Social History   Tobacco Use  . Smoking status: Former Smoker    Types: Cigars    Quit date: 09/02/1977    Years since quitting: 42.5  . Smokeless tobacco: Never Used  . Tobacco comment:    Vaping Use  . Vaping Use: Never used  Substance Use Topics  . Alcohol use: Not Currently    Comment: "stopped drinking alcohol in ~ 1980; just drank a little  on the weekends when I did drink"  . Drug use: No    Review of Systems  All other systems negative except as documented in the HPI. All pertinent positives and negatives as reviewed in the HPI. ____________________________________________  PHYSICAL EXAM:  VITAL SIGNS: ED Triage Vitals  Enc Vitals Group     BP 02/25/20 2308 (!) 155/108     Pulse Rate 02/25/20 2308 78     Resp 02/25/20 2308 20     Temp 02/25/20 2308 98.7 F (37.1 C)     Temp Source 02/25/20 2308 Oral     SpO2 02/25/20 2308 99 %    Constitutional: Alert and oriented. Well appearing and in no acute distress. Eyes: Conjunctivae are normal. PERRL. EOMI. Head: Atraumatic. Nose: No congestion/rhinnorhea. Mouth/Throat: Mucous membranes are moist.  Oropharynx non-erythematous. Neck: No stridor.  No meningeal signs.   Cardiovascular: Normal rate, regular rhythm. Good peripheral circulation. Grossly normal heart sounds.   Respiratory: Normal respiratory effort.  No retractions. Lungs CTAB. Gastrointestinal: Soft and nontender. No distention.  Musculoskeletal: No lower extremity tenderness nor edema. No gross deformities of extremities. Neurologic:  Normal speech  and language. No gross focal neurologic deficits are appreciated.  Skin:  Skin is warm, dry and intact. No rash noted.  ____________________________________________   LABS (all labs ordered are listed, but only abnormal results are displayed)  Labs Reviewed  BASIC METABOLIC PANEL - Abnormal; Notable for the following components:      Result Value   Sodium 133 (*)    Chloride 92 (*)    Glucose, Bld 101 (*)    BUN 44 (*)    Creatinine, Ser 10.93 (*)    GFR calc non Af Amer 4 (*)    GFR calc Af Amer 5 (*)    All other components within normal limits  CBC - Abnormal; Notable for the following components:   RBC 2.41 (*)    Hemoglobin 7.2 (*)    HCT 22.4 (*)    RDW 16.7 (*)    All other components within normal limits  URINALYSIS, ROUTINE W REFLEX MICROSCOPIC - Abnormal; Notable for the following components:   Color, Urine STRAW (*)    Hgb urine dipstick MODERATE (*)    Protein, ur 100 (*)    Bacteria, UA RARE (*)    All other components within normal limits  CBG MONITORING, ED  CBG MONITORING, ED  PREPARE RBC (CROSSMATCH)  TYPE AND SCREEN   ____________________________________________  EKG   EKG Interpretation  Date/Time:  Saturday February 26 2020 05:27:36 EDT Ventricular Rate:  77 PR Interval:    QRS Duration: 99 QT Interval:  377 QTC Calculation: 427 R Axis:   -65 Text Interpretation: Sinus rhythm Atrial premature complex Left anterior fascicular block Abnormal R-wave progression, late transition Borderline T abnormalities, anterior leads Confirmed by Merrily Pew [phone]) on 02/26/2020 6:33:33 AM      ____________________________________________  RADIOLOGY  No results found. ____________________________________________  PROCEDURES  Procedure(s) performed:   Procedures ____________________________________________  INITIAL IMPRESSION / ASSESSMENT AND PLAN / ED COURSE   This patient presents to the ED for concern of weakness, this involves an extensive  number of treatment options, and is a complaint that carries with it a high risk of complications and morbidity.  The differential diagnosis includes anemia, infection.      Lab Tests:   I Ordered, reviewed, and interpreted labs, which included cbc with anemia.    Medicines ordered:   I ordered blood  For  transfusion   Imaging Studies ordered:   I independently visualized and interpreted imaging cxr which showed no significant abnormalities.  Additional history obtained:   Additional history obtained from noone  Previous records obtained and reviewed in epic  Consultations Obtained:   I consulted noone  and discussed lab and imaging findings  Reevaluation:  After the interventions stated above, I reevaluated the patient and found he was stable. Secondary to limitations of hospital we will go and give transfusions in the emergency room and discharge if he is stable as is unlikely he will actually get a hospital bed even if he was admitted.    A medical screening exam was performed and I feel the patient has had an appropriate workup for their chief complaint at this time and likelihood of emergent condition existing is low. They have been counseled on decision, discharge, follow up and which symptoms necessitate immediate return to the emergency department. They or their family verbally stated understanding and agreement with plan and discharged in stable condition.   ____________________________________________  FINAL CLINICAL IMPRESSION(S) / ED DIAGNOSES  Final diagnoses:  Weakness  Anemia of chronic disease    MEDICATIONS GIVEN DURING THIS VISIT:  Medications  0.9 %  sodium chloride infusion (0 mL/hr Intravenous Stopped 02/26/20 1324)  fentaNYL (SUBLIMAZE) injection 50 mcg (50 mcg Intravenous Given 02/26/20 0540)  acetaminophen (TYLENOL) tablet 650 mg (650 mg Oral Given 02/26/20 0540)  furosemide (LASIX) injection 40 mg (40 mg Intravenous Given 02/26/20 0755)    acetaminophen (TYLENOL) tablet 500 mg (500 mg Oral Given 02/26/20 1141)    NEW OUTPATIENT MEDICATIONS STARTED DURING THIS VISIT:  Discharge Medication List as of 02/26/2020  4:37 PM      Note:  This note was prepared with assistance of Dragon voice recognition software. Occasional wrong-word or sound-a-like substitutions may have occurred due to the inherent limitations of voice recognition software.   Brianna Bennett, Corene Cornea, MD 02/28/20 [phone]

## 2020-02-26 NOTE — ED Notes (Signed)
Lunch was ordered by Chia Mowers 

## 2020-02-27 LAB — BPAM RBC
Blood Product Expiration Date: 202107222359
Blood Product Expiration Date: 202107222359
ISSUE DATE / TIME: 202106260737
ISSUE DATE / TIME: 202106261308
Unit Type and Rh: 6200
Unit Type and Rh: 6200

## 2020-02-27 LAB — TYPE AND SCREEN
ABO/RH(D): A POS
Antibody Screen: NEGATIVE
Unit division: 0
Unit division: 0

## 2020-02-28 ENCOUNTER — Inpatient Hospital Stay: Payer: Medicare Other | Admitting: Internal Medicine

## 2020-02-29 NOTE — Telephone Encounter (Signed)
This medication was initially filled by an Urgent Care. We can not provide long term narcotics from this office. I am happy to put in referral to pain management if needed.   Phill Myron, D.O. Primary Care at [facility]  02/29/2020, 11:31 AM

## 2020-03-01 NOTE — Telephone Encounter (Signed)
Called pt lvm .

## 2020-03-01 NOTE — Progress Notes (Signed)
HEMATOLOGY/ONCOLOGY CONSULTATION NOTE  Date of Service: 03/03/20  Patient Care Team: [name], DO as PCP - General (Family Medicine) Minus Breeding, MD as PCP - Cardiology (Cardiology)  CHIEF COMPLAINTS/PURPOSE OF CONSULTATION:  Follow up for his Renal Cell carcinoma & Myeloma  HISTORY OF PRESENTING ILLNESS:  [name] [name] is a wonderful 74 y.o. male who has 74 year old man PMH renal cell carcinoma, prostate cancer, multiple myeloma, COPD, CKD stage III PRESENTED to [facility] emergency Department for hematuria beginning after self-catheterization (BPH). Found to have acute kidney injury.   Seen in the emergency department 10/1 for left great toe pain, diagnosed with gout, found to have worsening renal function, admission recommended but patient declined at that time.  Last evening the patient self catheterized and developed acute hematuria and so came to the emergency department for further evaluation where he was found to have gross hematuria.  He denies any bladder pain.  Review of systems is unremarkable.  No COVID symptoms or known exposure.  No specific aggravating or alleviating factors.  He reports that he has follow-up with his urologist on the 16th.  He is aware of his diagnosis of myeloma and suspected kidney cancer but did not seem concerned about this.  He has not been eating very well but drinks a lot of water.  INTERVAL HISTORY:   [name] [name] is a 74 year old man who is here for a f/u of his renal cell carcinoma, and multiple myeloma. The patient's last visit with [place] was on 07/22/2019. The pt reports that he is doing well overall.  The pt reports he is good. Pt is on dialysis treatment. He is getting his Port taken out. Pt has poor circulation in his legs. He has not followed-up with his urologist, Dr. Louis Meckel with [facility]. Pt is trying to get home care. He reports that he thinks he wants to be on dialysis, have kidney surgery and restarting  chemotherapy. The last time he saw Dr. Juleen China his PCP was about 3 months ago. He is having leg pain, and upper arm/shoulder pain. Pt has not gotten his COVID19 vaccines.   Lab results today (03/03/20) of CBC w/diff and CMP is as follows: all values are WNL except for RBC at 2.83, Hemoglobin at 8.6, HCT at 25.9, RDW at 15.8, Chloride at 93, Glucose at 105, BUN at 43, Creatinine at 10.05, Albumin at 3.1, GFR, Est Non Af Am at 5, GFR, Est AFR Am at 5 03/03/20 of Kappa/Lambda Light Chains is as follows: Kappa light chains significantly elevated , K/L ratio 1776  On review of systems, pt reports leg pain, upper arm/shoulder pain and denies any other symptoms.   MEDICAL HISTORY:  Past Medical History:  Diagnosis Date  . Abnormal CT of liver    a. nodular contour suggesting cirrhosis 05/2018.  [name] Abnormal LFTs (liver function tests)   . Anxiety   . Aortic root dilatation ([place])   . Ascending aortic aneurysm ([place])   . BPH with urinary obstruction   . Bradycardia   . 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 CAD (coronary artery disease)    a. 12/2015 NSTEMI/Cath (in setting of PAF):  LM nl, LAD 30p,  LCX 4m RCA  ok, AM 100%, RPDA1 40, RPDA 2 60 ->Med Rx.  . Childhood asthma   . Chronic diastolic CHF (congestive heart failure) ([place]    a. 12/2015 Echo: EF 50-55%,  mod AI, mod Ao root dil, mild MR, mod dil LA, mod RA.  . CKD (chronic kidney disease), stage III   . Colon cancer ([place])   . COPD (chronic obstructive pulmonary disease) ([place])    pt denies at preop  . Diabetes mellitus type II    diet controlled  . History of syncope   . Hyperlipidemia   . Hypertensive heart disease   . Kidney lump 04/04/2010   Overview:  Renal Cell Carcinoma   . Light chain myeloma ([place])   . Moderate aortic insufficiency    a. 12/2015 Echo: Mod AI.  [name] Paroxysmal atrial fibrillation ([place])    a. 12/2015 started on Xarelto (CHA2DS2VASc =  4-5).  . Pneumonia   . Prostate cancer ([place])   . Renal cell carcinoma ([place])   . Sleep apnea    Does not like  CPAP  . Spinal stenosis in cervical region 10/12/2016  . Venous insufficiency     SURGICAL HISTORY: Past Surgical History:  Procedure Laterality Date  . AV FISTULA PLACEMENT Left 01/26/2020   Procedure: left ARTERIOVENOUS (AV) FISTULA CREATION;  Surgeon: Waynetta Sandy, MD;  Location: [place];  Service: Vascular;  Laterality: Left;  . BACK SURGERY    . CARDIAC CATHETERIZATION N/A 12/18/2015   Procedure: Left Heart Cath and Coronary Angiography;  Surgeon: Leonie Man, MD;  Location: [place] [facility];  Service: Cardiovascular;  Laterality: N/A;  . DIAGNOSTIC LAPAROSCOPY     partial colectomy  . IR FLUORO GUIDE CV LINE RIGHT  06/28/2019  . IR FLUORO GUIDE CV LINE RIGHT  01/24/2020  . IR REMOVAL TUN CV CATH W/O FL  07/26/2019  . IR US GUIDE VASC ACCESS RIGHT  06/28/2019  . IR US GUIDE VASC ACCESS RIGHT  01/24/2020  . LAPAROSCOPIC PARTIAL COLECTOMY Right 05/21/2018   Procedure: LAPAROSCOPIC RIGHT  COLECTOMY ERAS PATHWAY;  Surgeon: Leighton Ruff, MD;  Location: [facility];  Service: General;  Laterality: Right;  . [place]   "replaced a disc"    SOCIAL HISTORY: Social History   Socioeconomic History  . Marital status: Widowed    Spouse name: Not on file  . Number of children: Not on file  . Years of education: Not on file  . Highest education level: Not on file  Occupational History  . Not on file  Tobacco Use  . Smoking status: Former Smoker    Types: Cigars    Quit date: 09/02/1977    Years since quitting: 42.5  . Smokeless tobacco: Never Used  . Tobacco comment:    Vaping Use  . Vaping Use: Never used  Substance and Sexual Activity  . Alcohol use: Not Currently    Comment: "stopped drinking alcohol in ~ 1980; just drank a little on the weekends when I did drink"  . Drug use: No  . Sexual activity: Not Currently  Other Topics Concern   . Not on file  Social History Narrative   Lives alone.  Wife died in 2023-04-07.  Lives in apartment.     Social Determinants of Health   Financial Resource Strain:   . Difficulty of Paying Living Expenses:   Food Insecurity:   . Worried About Charity fundraiser in the Last Year:   . Arboriculturist in the Last Year:   Transportation Needs:   . Film/video editor (Medical):   [name] Lack of Transportation (Non-Medical):   Physical Activity:   . Days of Exercise  per Week:   . Minutes of Exercise per Session:   Stress:   . Feeling of Stress :   Social Connections:   . Frequency of Communication with Friends and Family:   . Frequency of Social Gatherings with Friends and Family:   . Attends Religious Services:   . Active Member of Clubs or Organizations:   . Attends Archivist Meetings:   [name] Marital Status:   Intimate Partner Violence:   . Fear of Current or Ex-Partner:   . Emotionally Abused:   [name] Physically Abused:   . Sexually Abused:     FAMILY HISTORY: Family History  Problem Relation Age of Onset  . Emphysema Father   . Asthma Father   . Liver disease Father        tumor  . Heart disease Mother   . Hypertension Mother   . Asthma Sister   . Hypertension Son     ALLERGIES:  is allergic to amoxicillin and penicillins.  MEDICATIONS:  Current Outpatient Medications  Medication Sig Dispense Refill  . amLODipine (NORVASC) 10 MG tablet Take 1 tablet (10 mg total) by mouth daily. 90 tablet 1  . atorvastatin (LIPITOR) 40 MG tablet Take 40 mg by mouth daily as needed (pt states he only takes when his cholesterol is high).     . cetirizine (ZYRTEC) 10 MG tablet TAKE 1 TABLET (10 MG TOTAL) BY MOUTH DAILY AS NEEDED (SEASONAL ALLERGIES). (Patient taking differently: Take 10 mg by mouth daily as needed for allergies (seasonal allergies). ) 90 tablet 3  . Cyanocobalamin (VITAMIN B-12 PO) Take 1 tablet by mouth daily.    [name] glucose blood (ONETOUCH ULTRA) test strip Check  FSBS twice a day. Dx: E11.21, N18.32 (Patient taking differently: 1 each by Other route See admin instructions. Check FSBS twice a day. Dx: E11.21, N18.32) 100 each 3  . HYDROcodone-acetaminophen (NORCO/VICODIN) 5-325 MG tablet Take 1-2 tablets by mouth every 6 (six) hours as needed (gout pain). (Patient not taking: Reported on 02/26/2020) 12 tablet 0  . metoprolol tartrate (LOPRESSOR) 25 MG tablet Take 0.5 tablets (12.5 mg total) by mouth 2 (two) times daily. 90 tablet 0  . multivitamin (RENA-VIT) TABS tablet Take 1 tablet by mouth at bedtime. 90 tablet 0  . Omega-3 Fatty Acids (FISH OIL PO) Take 1 capsule by mouth daily.     . tamsulosin (FLOMAX) 0.4 MG CAPS capsule Take 1 capsule (0.4 mg total) by mouth daily. 90 capsule 0   No current facility-administered medications for this visit.    REVIEW OF SYSTEMS:   A 10+ POINT REVIEW OF SYSTEMS WAS OBTAINED including neurology, dermatology, psychiatry, cardiac, respiratory, lymph, extremities, GI, GU, Musculoskeletal, constitutional, breasts, reproductive, HEENT.  All pertinent positives are noted in the HPI.  All others are negative.   PHYSICAL EXAMINATION: ECOG FS:2 - Symptomatic, <50% confined to bed  There were no vitals filed for this visit. Wt Readings from Last 3 Encounters:  02/26/20 200 lb 8 oz (90.9 kg)  02/25/20 213 lb (96.6 kg)  02/18/20 196 lb 6.4 oz (89.1 kg)   There is no height or weight on file to calculate BMI.    GENERAL:alert, in no acute distress and comfortable SKIN: no acute rashes, no significant lesions EYES: conjunctiva are pink and non-injected, sclera anicteric OROPHARYNX: MMM, no exudates, no oropharyngeal erythema or ulceration NECK: supple, no JVD LYMPH:  no palpable lymphadenopathy in the cervical, axillary or inguinal regions LUNGS: clear to auscultation b/l with normal respiratory effort  HEART: regular rate & rhythm ABDOMEN:  normoactive bowel sounds , non tender, not distended. Extremity: no pedal  edema PSYCH: alert & oriented x 3 with fluent speech NEURO: no focal motor/sensory deficits  LABORATORY DATA:  I have reviewed the data as listed  . CBC Latest Ref Rng & Units 03/06/2020 03/03/2020 02/25/2020  WBC 4.0 - 10.5 K/uL 5.8 5.1 6.0  Hemoglobin 13.0 - 17.0 g/dL 8.1(L) 8.6(L) 7.2(L)  Hematocrit 39 - 52 % 24.9(L) 25.9(L) 22.4(L)  Platelets 150 - 400 K/uL 236 178 151    . CMP Latest Ref Rng & Units 03/06/2020 03/03/2020 02/25/2020  Glucose 70 - 99 mg/dL 98 105(H) 101(H)  BUN 8 - 23 mg/dL 46(H) 43(H) 44(H)  Creatinine 0.61 - 1.24 mg/dL 10.87(H) 10.05(HH) 10.93(H)  Sodium 135 - 145 mmol/L 129(L) 135 133(L)  Potassium 3.5 - 5.1 mmol/L 3.9 4.1 4.6  Chloride 98 - 111 mmol/L 87(L) 93(L) 92(L)  CO2 22 - 32 mmol/L 26 27 27   Calcium 8.9 - 10.3 mg/dL 9.1 9.2 9.2  Total Protein 6.5 - 8.1 g/dL - 7.0 -  Total Bilirubin 0.3 - 1.2 mg/dL - 0.5 -  Alkaline Phos 38 - 126 U/L - 69 -  AST 15 - 41 U/L - 16 -  ALT 0 - 44 U/L - 11 -   RADIOGRAPHIC STUDIES: I have personally reviewed the radiological images as listed and agreed with the findings in the report. DG Chest Portable 1 View  Result Date: 02/26/2020 CLINICAL DATA:  Shortness of breath EXAM: PORTABLE CHEST 1 VIEW COMPARISON:  February 05, 2020 FINDINGS: A right dialysis catheter is in good position. Stable cardiomegaly. The hila and mediastinum are unremarkable. No pneumothorax. No overt edema. No focal infiltrate, nodule, or mass. IMPRESSION: Right dialysis catheter is above. No other acute abnormalities are identified. Electronically Signed   By: Dorise Bullion III M.D   On: 02/26/2020 05:50   DG CHEST PORT 1 VIEW  Result Date: 02/05/2020 CLINICAL DATA:  COVID-19, new onset dialysis EXAM: PORTABLE CHEST 1 VIEW COMPARISON:  01/22/2020 FINDINGS: Mild subpleural opacities in the right upper lobe and left upper lobe/lingula. Mild retrocardiac opacity. These findings favor multifocal pneumonia in this patient with known COVID. No pleural effusion or  pneumothorax. Cardiomegaly. Right IJ dual lumen dialysis catheter terminating in the upper right atrium. IMPRESSION: Multifocal pneumonia in this patient with known COVID. Electronically Signed   By: Julian Hy M.D.   On: 02/05/2020 00:57    ASSESSMENT & PLAN:     1) Progressive Kappa light chain Multiple Myeloma -- with likely progression since patient abandoned treatment on 2 previous occasions. Now with ESRD on Hemodialysis and with significant anemia. 2) Worsening renal failure -- likely myeloma kidney- ESRD on HD 3) Anemia due to CKD, myeloma , RCC  4) RCC - 7.6 cms in upper pole of rt kidney.  PLAN:  -Discussed pt labwork today, 03/03/20; of CBC w/diff and CMP is as follows: all values are WNL except for RBC at 2.83, Hemoglobin at 8.6, HCT at 25.9, RDW at 15.8, Chloride at 93, Glucose at 105, BUN at 43, Creatinine at 10.05, Albumin at 3.1, GFR, Est Non Af Am at 5, GFR, Est AFR Am at 5 -Discussed 03/03/20 of Kappa/Lambda Light Chains significantly abnormal. -Previously it was Discussed why pt wants to stop all treatment. Attempted to convince pt to continue treatment. Pt was adamant that his faith is all he needs to be cured. He stated that his son and family were aware  of his decision to stop all treatment. He is aware that the last time he abandoned treatment his kidney got worse progressively and so did his anemia. -Previously patient had choosen to cancel all medical oncology followup at this time. -Previously he was offered option for best supportive cares through hospice and refused this as well. -Advised on kidney surgery  -Advised pt has option to be kept comfortable  -Advised he has to get whole body scan- can help if pain is related to cancer, if due to joint or muscular issues will defer to PCP -Advised prioritizing treatments  -Advised on Ninlaro -Pt is now appears to want treatment for multiple myeloma and kidney surgery  -Recommends getting COVID19 vaccine -Continue  f/u with PCP and Urologist Dr. Louis Meckel -Continue to f/u with home care -ask PCP Dr. Juleen China  -Get in touch with PCP, Dr. Juleen China for medication needs  -Will get PET/CT in 1 week -called son during clinic visit - left msg -- unable to connect. -Will start Ninlaro -Will see back in 4 weeks with labs   FOLLOW UP: PET/CT in 1 week RTC with Dr Irene Limbo in 4 weeks with labs   The total time spent in the appt was 40 minutes and more than 50% was on counseling and direct patient cares.  All of the patient's questions were answered with apparent satisfaction. The patient knows to call the clinic with any problems, questions or concerns.  Sullivan Lone MD [place] AAHIVMS [facility] Hematology/Oncology Physician [facility]  (Office):       [phone] (Work cell):  [phone] (Fax):           [phone]  03/03/2020 8:26 AM  I, Dawayne Cirri am acting as a scribe for Dr. Sullivan Lone.   .I have reviewed the above documentation for accuracy and completeness, and I agree with the above. Brunetta Genera MD

## 2020-03-02 ENCOUNTER — Other Ambulatory Visit: Payer: Self-pay | Admitting: *Deleted

## 2020-03-02 DIAGNOSIS — C9000 Multiple myeloma not having achieved remission: Secondary | ICD-10-CM

## 2020-03-03 ENCOUNTER — Encounter: Payer: Medicare Other | Admitting: Vascular Surgery

## 2020-03-03 ENCOUNTER — Ambulatory Visit (HOSPITAL_COMMUNITY)
Admission: RE | Admit: 2020-03-03 | Discharge: 2020-03-03 | Disposition: A | Discharge status: Home / Self Care | Payer: Medicare Other | Source: Ambulatory Visit | Attending: Vascular Surgery | Admitting: Vascular Surgery

## 2020-03-03 ENCOUNTER — Encounter: Payer: Self-pay | Admitting: *Deleted

## 2020-03-03 ENCOUNTER — Inpatient Hospital Stay (HOSPITAL_BASED_OUTPATIENT_CLINIC_OR_DEPARTMENT_OTHER): Payer: Medicare Other | Admitting: Hematology

## 2020-03-03 ENCOUNTER — Other Ambulatory Visit: Payer: Self-pay

## 2020-03-03 ENCOUNTER — Ambulatory Visit (INDEPENDENT_AMBULATORY_CARE_PROVIDER_SITE_OTHER): Payer: Self-pay | Admitting: Physician Assistant

## 2020-03-03 ENCOUNTER — Other Ambulatory Visit: Payer: Self-pay | Admitting: Physician Assistant

## 2020-03-03 ENCOUNTER — Telehealth: Payer: Self-pay | Admitting: Hematology

## 2020-03-03 ENCOUNTER — Inpatient Hospital Stay: Payer: Medicare Other | Attending: Hematology

## 2020-03-03 VITALS — BP 135/67 | HR 80 | Temp 97.7°F | Resp 20 | Ht 69.0 in | Wt 196.0 lb

## 2020-03-03 VITALS — BP 140/90 | HR 82 | Temp 97.9°F | Resp 18 | Ht 69.0 in | Wt 200.7 lb

## 2020-03-03 DIAGNOSIS — E785 Hyperlipidemia, unspecified: Secondary | ICD-10-CM | POA: Diagnosis not present

## 2020-03-03 DIAGNOSIS — Z85038 Personal history of other malignant neoplasm of large intestine: Secondary | ICD-10-CM | POA: Insufficient documentation

## 2020-03-03 DIAGNOSIS — Z8546 Personal history of malignant neoplasm of prostate: Secondary | ICD-10-CM | POA: Insufficient documentation

## 2020-03-03 DIAGNOSIS — M79629 Pain in unspecified upper arm: Secondary | ICD-10-CM | POA: Diagnosis not present

## 2020-03-03 DIAGNOSIS — R945 Abnormal results of liver function studies: Secondary | ICD-10-CM | POA: Diagnosis not present

## 2020-03-03 DIAGNOSIS — Z992 Dependence on renal dialysis: Secondary | ICD-10-CM | POA: Diagnosis not present

## 2020-03-03 DIAGNOSIS — I5032 Chronic diastolic (congestive) heart failure: Secondary | ICD-10-CM | POA: Insufficient documentation

## 2020-03-03 DIAGNOSIS — N186 End stage renal disease: Secondary | ICD-10-CM

## 2020-03-03 DIAGNOSIS — I8001 Phlebitis and thrombophlebitis of superficial vessels of right lower extremity: Secondary | ICD-10-CM

## 2020-03-03 DIAGNOSIS — Z79899 Other long term (current) drug therapy: Secondary | ICD-10-CM | POA: Diagnosis not present

## 2020-03-03 DIAGNOSIS — C9000 Multiple myeloma not having achieved remission: Secondary | ICD-10-CM

## 2020-03-03 DIAGNOSIS — M25519 Pain in unspecified shoulder: Secondary | ICD-10-CM | POA: Insufficient documentation

## 2020-03-03 DIAGNOSIS — Z87891 Personal history of nicotine dependence: Secondary | ICD-10-CM | POA: Insufficient documentation

## 2020-03-03 DIAGNOSIS — C9002 Multiple myeloma in relapse: Secondary | ICD-10-CM | POA: Insufficient documentation

## 2020-03-03 DIAGNOSIS — E1122 Type 2 diabetes mellitus with diabetic chronic kidney disease: Secondary | ICD-10-CM | POA: Diagnosis not present

## 2020-03-03 DIAGNOSIS — I132 Hypertensive heart and chronic kidney disease with heart failure and with stage 5 chronic kidney disease, or end stage renal disease: Secondary | ICD-10-CM | POA: Insufficient documentation

## 2020-03-03 DIAGNOSIS — C642 Malignant neoplasm of left kidney, except renal pelvis: Secondary | ICD-10-CM | POA: Diagnosis not present

## 2020-03-03 DIAGNOSIS — I480 Paroxysmal atrial fibrillation: Secondary | ICD-10-CM | POA: Insufficient documentation

## 2020-03-03 DIAGNOSIS — C649 Malignant neoplasm of unspecified kidney, except renal pelvis: Secondary | ICD-10-CM | POA: Diagnosis not present

## 2020-03-03 DIAGNOSIS — M79606 Pain in leg, unspecified: Secondary | ICD-10-CM | POA: Insufficient documentation

## 2020-03-03 DIAGNOSIS — R310 Gross hematuria: Secondary | ICD-10-CM | POA: Insufficient documentation

## 2020-03-03 DIAGNOSIS — J449 Chronic obstructive pulmonary disease, unspecified: Secondary | ICD-10-CM | POA: Insufficient documentation

## 2020-03-03 DIAGNOSIS — N179 Acute kidney failure, unspecified: Secondary | ICD-10-CM | POA: Diagnosis not present

## 2020-03-03 DIAGNOSIS — F419 Anxiety disorder, unspecified: Secondary | ICD-10-CM | POA: Insufficient documentation

## 2020-03-03 DIAGNOSIS — D631 Anemia in chronic kidney disease: Secondary | ICD-10-CM | POA: Diagnosis not present

## 2020-03-03 DIAGNOSIS — I714 Abdominal aortic aneurysm, without rupture: Secondary | ICD-10-CM | POA: Diagnosis not present

## 2020-03-03 DIAGNOSIS — I83819 Varicose veins of unspecified lower extremities with pain: Secondary | ICD-10-CM

## 2020-03-03 LAB — CMP (CANCER CENTER ONLY)
ALT: 11 U/L (ref 0–44)
AST: 16 U/L (ref 15–41)
Albumin: 3.1 g/dL — ABNORMAL LOW (ref 3.5–5.0)
Alkaline Phosphatase: 69 U/L (ref 38–126)
Anion gap: 15 (ref 5–15)
BUN: 43 mg/dL — ABNORMAL HIGH (ref 8–23)
CO2: 27 mmol/L (ref 22–32)
Calcium: 9.2 mg/dL (ref 8.9–10.3)
Chloride: 93 mmol/L — ABNORMAL LOW (ref 98–111)
Creatinine: 10.05 mg/dL (ref 0.61–1.24)
GFR, Est AFR Am: 5 mL/min — ABNORMAL LOW (ref >60)
GFR, Estimated: 5 mL/min — ABNORMAL LOW (ref >60)
Glucose, Bld: 105 mg/dL — ABNORMAL HIGH (ref 70–99)
Potassium: 4.1 mmol/L (ref 3.5–5.1)
Sodium: 135 mmol/L (ref 135–145)
Total Bilirubin: 0.5 mg/dL (ref 0.3–1.2)
Total Protein: 7 g/dL (ref 6.5–8.1)

## 2020-03-03 LAB — CBC WITH DIFFERENTIAL (CANCER CENTER ONLY)
Abs Immature Granulocytes: 0.03 10*3/uL (ref 0.00–0.07)
Basophils Absolute: 0 10*3/uL (ref 0.0–0.1)
Basophils Relative: 0 %
Eosinophils Absolute: 0.1 10*3/uL (ref 0.0–0.5)
Eosinophils Relative: 2 %
HCT: 25.9 % — ABNORMAL LOW (ref 39.0–52.0)
Hemoglobin: 8.6 g/dL — ABNORMAL LOW (ref 13.0–17.0)
Immature Granulocytes: 1 %
Lymphocytes Relative: 19 %
Lymphs Abs: 1 10*3/uL (ref 0.7–4.0)
MCH: 30.4 pg (ref 26.0–34.0)
MCHC: 33.2 g/dL (ref 30.0–36.0)
MCV: 91.5 fL (ref 80.0–100.0)
Monocytes Absolute: 0.7 10*3/uL (ref 0.1–1.0)
Monocytes Relative: 14 %
Neutro Abs: 3.3 10*3/uL (ref 1.7–7.7)
Neutrophils Relative %: 64 %
Platelet Count: 178 10*3/uL (ref 150–400)
RBC: 2.83 MIL/uL — ABNORMAL LOW (ref 4.22–5.81)
RDW: 15.8 % — ABNORMAL HIGH (ref 11.5–15.5)
WBC Count: 5.1 10*3/uL (ref 4.0–10.5)
nRBC: 0 % (ref 0.0–0.2)

## 2020-03-03 MED ORDER — DICLOFENAC SODIUM 1 % EX GEL: 2.0000 g | Freq: Four times a day (QID) | CUTANEOUS | 0 refills | Status: DC

## 2020-03-03 NOTE — Progress Notes (Signed)
Established Dialysis Access   History of Present Illness   [name] [name] is a 74 y.o. (Feb 10, 1946) male who presents for re-evaluation of permanent access.  He is status post left first stage basilic vein transposition by Dr. Donzetta Matters on 01/26/2020.  He is dialyzing via right IJ [facility] on a Tuesday Thursday Saturday schedule without complication.  He denies any signs or symptoms of steal syndrome in his left hand and states the antecubital incision is well-healed.  He is ready and willing to proceed with second stage of basilic vein transposition.  Patient is also complaining today of severe pain mainly in his medial knee but extending proximally to his groin along the path of the saphenous vein.  He was evaluated for this in April in the emergency department and venous duplex was negative for DVT.  This was considered to be a thrombophlebitis of his saphenous vein.  He states the pain has been coming and going since then however over the past few weeks has been more severe.  He denies any increase in edema of his right lower extremity.  He denies any nonhealing wounds or rest pain in his right foot.  Current Outpatient Medications  Medication Sig Dispense Refill  . amLODipine (NORVASC) 10 MG tablet Take 1 tablet (10 mg total) by mouth daily. 90 tablet 1  . atorvastatin (LIPITOR) 40 MG tablet Take 40 mg by mouth daily as needed (pt states he only takes when his cholesterol is high).     . cetirizine (ZYRTEC) 10 MG tablet TAKE 1 TABLET (10 MG TOTAL) BY MOUTH DAILY AS NEEDED (SEASONAL ALLERGIES). (Patient taking differently: Take 10 mg by mouth daily as needed for allergies (seasonal allergies). ) 90 tablet 3  . Cyanocobalamin (VITAMIN B-12 PO) Take 1 tablet by mouth daily.    [name] glucose blood (ONETOUCH ULTRA) test strip Check FSBS twice a day. Dx: E11.21, N18.32 (Patient taking differently: 1 each by Other route See admin instructions. Check FSBS twice a day. Dx: E11.21, N18.32) 100 each 3  .  HYDROcodone-acetaminophen (NORCO/VICODIN) 5-325 MG tablet Take 1-2 tablets by mouth every 6 (six) hours as needed (gout pain). 12 tablet 0  . metoprolol tartrate (LOPRESSOR) 25 MG tablet Take 0.5 tablets (12.5 mg total) by mouth 2 (two) times daily. 90 tablet 0  . multivitamin (RENA-VIT) TABS tablet Take 1 tablet by mouth at bedtime. 90 tablet 0  . Omega-3 Fatty Acids (FISH OIL PO) Take 1 capsule by mouth daily.     . tamsulosin (FLOMAX) 0.4 MG CAPS capsule Take 1 capsule (0.4 mg total) by mouth daily. 90 capsule 0  . diclofenac Sodium (VOLTAREN) 1 % GEL Apply 2 g topically 4 (four) times daily. 100 g 0   No current facility-administered medications for this visit.    REVIEW OF SYSTEMS (negative unless checked):   Cardiac:  []  Chest pain or chest pressure? []  Shortness of breath upon activity? []  Shortness of breath when lying flat? []  Irregular heart rhythm?  Vascular:  []  Pain in calf, thigh, or hip brought on by walking? []  Pain in feet at night that wakes you up from your sleep? []  Blood clot in your veins? []  Leg swelling?  Pulmonary:  []  Oxygen at home? []  Productive cough? []  Wheezing?  Neurologic:  []  Sudden weakness in arms or legs? []  Sudden numbness in arms or legs? []  Sudden onset of difficult speaking or slurred speech? []  Temporary loss of vision in one eye? []  Problems with dizziness?  Gastrointestinal:  []  Blood in stool? []  Vomited blood?  Genitourinary:  []  Burning when urinating? []  Blood in urine?  Psychiatric:  []  Major depression  Hematologic:  []  Bleeding problems? []  Problems with blood clotting?  Dermatologic:  []  Rashes or ulcers?  Constitutional:  []  Fever or chills?  Ear/Nose/Throat:  []  Change in hearing? []  Nose bleeds? []  Sore throat?  Musculoskeletal:  []  Back pain? []  Joint pain? []  Muscle pain?   Physical Examination   Vitals:   03/03/20 1359  BP: 135/67  Pulse: 80  Resp: 20  Temp: 97.7 F (36.5 C)    TempSrc: Temporal  SpO2: 99%  Weight: 196 lb (88.9 kg)  Height: 5\' 9"  (1.753 m)   Body mass index is 28.94 kg/m.  General:  WDWN in NAD; vital signs documented above Gait: Not observed HENT: WNL, normocephalic Pulmonary: normal non-labored breathing , without Rales, rhonchi,  wheezing Cardiac: regular HR Abdomen: soft, NT, no masses Skin: without rashes Vascular Exam/Pulses:  Right Left  Radial 2+ (normal) 2+ (normal)  DP Brisk by doppler Brisk by doppler  PT Brisk by doppler Brisk by doppler   Extremities: Palpable thrill through left arm basilic vein fistula Palpable cords in medial to posterior knee consistent with varicose veins; patient with varicose veins also in medial lower leg Musculoskeletal: no muscle wasting or atrophy  Neurologic: A&O X 3;  No focal weakness or paresthesias are detected Psychiatric:  The pt has Normal affect.   Non-invasive Vascular Imaging   left Arm Access Duplex  :   diameter greater than 6 mm throughout course of fistula    Medical Decision Making   [name] is a 74 y.o. male who presents with ESRD requiring hemodialysis.  Medial/posterior right knee pain extending up to right groin   Based on physical exam and fistula duplex, fistula is ready for second stage of transposition  Plan will be for left arm second stage transposition by Dr. Donzetta Matters on a Monday Wednesday or Friday in the next few weeks   Pain is reproducible with palpation along the path of his right greater saphenous vein from his knee to his groin; there also palpable thrombosed varicose veins around his medial and posterior knee  I recommended warm compress as well as topical Voltaren for pain relief  I also recommended compression and elevation; patient is not interested in further work-up of venous insufficiency as long as the current pain he is experiencing resolves.  Venous duplex was negative for DVT in April of this year and based on symptoms there has not  been a significant change.  We can further workup venous insufficiency post operatively if patient changes his mind    Dagoberto Ligas PA-C [facility] Office: [facility] MD: Donzetta Matters

## 2020-03-03 NOTE — Progress Notes (Signed)
CRITICAL VALUE STICKER  CRITICAL VALUE: Creatinine 10.05   RECEIVER (on-site recipient of call): Manuela Schwartz, [place] NOTIFIED: 03/03/20 @ 0925   MESSENGER (representative from lab):Suanne Marker   MD NOTIFIED: Dr. Irene Limbo  TIME OF NOTIFICATION: 3702  RESPONSE: In process of seeing patient today

## 2020-03-03 NOTE — Telephone Encounter (Signed)
No appts needed to be scheduled per 7/2 los. Pt only need CT/PET per los. Printed avs and calendar for pt and gave pt central radiology's number.

## 2020-03-06 ENCOUNTER — Emergency Department (HOSPITAL_BASED_OUTPATIENT_CLINIC_OR_DEPARTMENT_OTHER): Payer: Medicare Other

## 2020-03-06 ENCOUNTER — Other Ambulatory Visit: Payer: Medicare Other | Admitting: Hospice

## 2020-03-06 ENCOUNTER — Emergency Department (HOSPITAL_COMMUNITY)
Admission: EM | Admit: 2020-03-06 | Discharge: 2020-03-06 | Disposition: A | Discharge status: Home / Self Care | Payer: Medicare Other | Attending: Emergency Medicine | Admitting: Emergency Medicine

## 2020-03-06 ENCOUNTER — Other Ambulatory Visit: Payer: Self-pay

## 2020-03-06 ENCOUNTER — Encounter (HOSPITAL_COMMUNITY): Payer: Self-pay

## 2020-03-06 DIAGNOSIS — I2510 Atherosclerotic heart disease of native coronary artery without angina pectoris: Secondary | ICD-10-CM | POA: Insufficient documentation

## 2020-03-06 DIAGNOSIS — Z8546 Personal history of malignant neoplasm of prostate: Secondary | ICD-10-CM | POA: Insufficient documentation

## 2020-03-06 DIAGNOSIS — I5032 Chronic diastolic (congestive) heart failure: Secondary | ICD-10-CM | POA: Diagnosis not present

## 2020-03-06 DIAGNOSIS — E1122 Type 2 diabetes mellitus with diabetic chronic kidney disease: Secondary | ICD-10-CM | POA: Diagnosis not present

## 2020-03-06 DIAGNOSIS — Z79899 Other long term (current) drug therapy: Secondary | ICD-10-CM | POA: Insufficient documentation

## 2020-03-06 DIAGNOSIS — I252 Old myocardial infarction: Secondary | ICD-10-CM | POA: Diagnosis not present

## 2020-03-06 DIAGNOSIS — Z87891 Personal history of nicotine dependence: Secondary | ICD-10-CM | POA: Insufficient documentation

## 2020-03-06 DIAGNOSIS — N189 Chronic kidney disease, unspecified: Secondary | ICD-10-CM | POA: Diagnosis not present

## 2020-03-06 DIAGNOSIS — I480 Paroxysmal atrial fibrillation: Secondary | ICD-10-CM | POA: Insufficient documentation

## 2020-03-06 DIAGNOSIS — I130 Hypertensive heart and chronic kidney disease with heart failure and stage 1 through stage 4 chronic kidney disease, or unspecified chronic kidney disease: Secondary | ICD-10-CM | POA: Insufficient documentation

## 2020-03-06 DIAGNOSIS — R609 Edema, unspecified: Secondary | ICD-10-CM | POA: Diagnosis not present

## 2020-03-06 DIAGNOSIS — N186 End stage renal disease: Secondary | ICD-10-CM

## 2020-03-06 DIAGNOSIS — M79604 Pain in right leg: Secondary | ICD-10-CM | POA: Diagnosis not present

## 2020-03-06 DIAGNOSIS — Z515 Encounter for palliative care: Secondary | ICD-10-CM

## 2020-03-06 LAB — CBC
HCT: 24.9 % — ABNORMAL LOW (ref 39.0–52.0)
Hemoglobin: 8.1 g/dL — ABNORMAL LOW (ref 13.0–17.0)
MCH: 30 pg (ref 26.0–34.0)
MCHC: 32.5 g/dL (ref 30.0–36.0)
MCV: 92.2 fL (ref 80.0–100.0)
Platelets: 236 10*3/uL (ref 150–400)
RBC: 2.7 MIL/uL — ABNORMAL LOW (ref 4.22–5.81)
RDW: 15.9 % — ABNORMAL HIGH (ref 11.5–15.5)
WBC: 5.8 10*3/uL (ref 4.0–10.5)
nRBC: 0 % (ref 0.0–0.2)

## 2020-03-06 LAB — BASIC METABOLIC PANEL
Anion gap: 16 — ABNORMAL HIGH (ref 5–15)
BUN: 46 mg/dL — ABNORMAL HIGH (ref 8–23)
CO2: 26 mmol/L (ref 22–32)
Calcium: 9.1 mg/dL (ref 8.9–10.3)
Chloride: 87 mmol/L — ABNORMAL LOW (ref 98–111)
Creatinine, Ser: 10.87 mg/dL — ABNORMAL HIGH (ref 0.61–1.24)
GFR calc Af Amer: 5 mL/min — ABNORMAL LOW (ref >60)
GFR calc non Af Amer: 4 mL/min — ABNORMAL LOW (ref >60)
Glucose, Bld: 98 mg/dL (ref 70–99)
Potassium: 3.9 mmol/L (ref 3.5–5.1)
Sodium: 129 mmol/L — ABNORMAL LOW (ref 135–145)

## 2020-03-06 MED ORDER — OXYCODONE-ACETAMINOPHEN 5-325 MG PO TABS
1.0000 | ORAL_TABLET | Freq: Once | ORAL | Status: AC
  Administered 2020-03-06: 1 via ORAL
  Filled 2020-03-06: qty 1

## 2020-03-06 MED ORDER — OXYCODONE-ACETAMINOPHEN 5-325 MG PO TABS: 1.0000 | ORAL_TABLET | Freq: Four times a day (QID) | ORAL | 0 refills | Status: DC | PRN

## 2020-03-06 NOTE — ED Triage Notes (Signed)
Pt from home with ems for right thigh pain for the past 10-15 years but worsening the past few days. Hx of gout, was seen recently and given hydrocodone that relieved his pain. Pt ambulatory. Reports intermittent swelling in his lower leg but no redness.

## 2020-03-06 NOTE — Progress Notes (Signed)
Lower extremity venous has been completed.   Preliminary results in CV Proc.   Abram Sander 03/06/2020 10:34 AM

## 2020-03-06 NOTE — Discharge Instructions (Addendum)
Return here for trouble moving your right foot, severe pain, fever or chills, or any other problems Follow-up with your doctor as needed

## 2020-03-06 NOTE — Progress Notes (Signed)
[facility] Palliative Care Consult Note Telephone: [phone]  Fax: [phone]  PATIENT NAME: [name] [name] DOB: 1946/04/08 MRN: [id number]  PRIMARY CARE PROVIDER:   [name], DO  REFERRING PROVIDER: [name], DO  RESPONSIBLE PARTY:  [place] son - [phone] Lenard Simmer [phone]    RECOMMENDATIONS/PLAN:   Advance Care Planning/Goals of Care: Visit at the request of Phill Myron, DO  for palliative consult. Visit consisted of building trust and discussions on Palliative Medicine as specialized medical care for people living with serious illness, aimed at facilitating better quality of life through symptoms relief, assisting with advance care plan and establishing goals of care. Patient is a FULL code. Goals of care include to maximize quality of life and symptom management.  Visit consisted of counseling and education dealing with the complex and emotionally intense issues of symptom management and palliative care in the setting of serious and potentially life-threatening illness. Palliative care team will continue to support patient, patient's family, and medical team.  Spoke with daughter Billey Gosling during visit and we agreed on in person visit next month to further discuss advance care planning.  Symptom management: Patient said he went to the emergency room this morning related to pain in his right medial thigh.  He denies numbness or tingling to his right foot or ankle.  He says he gets narcotic-hydrocodone-but he does not like to use narcotic because it is not very helpful. He said he was given an order for Cortisone; he was given pain medication at the ED and this is very helpful.  Chart review indicates negative Doppler of his right lower extremity. Patient has Voltaren gel and he agreed to use it because in the past it was effective.  No recent COPD exacerbation, currently not on oxygen  or breathing treatments.  He denies cough/shortness of breath.  He continues on dialysis T T Sat; well tolerated, endorsing he is 'wiped out each time'. Encouraged rest, rest in between activities as needed. He is compliant with his medications.  Patient ambulated to the corridor door to to open wound door for NP and also walked her back after visit; in no medical acuity.  Encouraged ongoing care Follow up: Palliative care will continue to follow patient for goals of care clarification and symptom management. I spent 1 hour and 35 minutes providing this consultation; time iincludes time spent with patient/family, chart review, provider coordination,  and documentation. More than 50% of the time in this consultation was spent on coordinating communication  HISTORY OF PRESENT ILLNESS:  [name] is a 74 y.o. year old male with multiple medical problems including ESRD on dialysis, COPD. Palliative Care was asked to help address goals of care.   CODE STATUS: Full  PPS: 60 % HOSPICE ELIGIBILITY/DIAGNOSIS: TBD  PAST MEDICAL HISTORY:  Past Medical History:  Diagnosis Date  . Abnormal CT of liver    a. nodular contour suggesting cirrhosis 05/2018.  [name] Abnormal LFTs (liver function tests)   . Anxiety   . Aortic root dilatation ([place])   . Ascending aortic aneurysm ([place])   . BPH with urinary obstruction   . Bradycardia   . 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 CAD (coronary artery disease)    a. 12/2015 NSTEMI/Cath (in setting of PAF):  LM nl, LAD 30p,  LCX 15m, RCA  ok,  AM 100%, RPDA1 40, RPDA 2 60 ->Med Rx.  . Childhood asthma   . Chronic diastolic CHF (congestive heart failure) ([place])    a. 12/2015 Echo: EF 50-55%, mod AI, mod Ao root dil, mild MR, mod dil LA, mod RA.  . CKD (chronic kidney disease), stage III   . Colon cancer ([place])   . COPD (chronic obstructive pulmonary disease) ([place])    pt denies at preop  .  Diabetes mellitus type II    diet controlled  . History of syncope   . Hyperlipidemia   . Hypertensive heart disease   . Kidney lump 04/04/2010   Overview:  Renal Cell Carcinoma   . 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 Moderate aortic insufficiency    a. 12/2015 Echo: Mod AI.  [name] Paroxysmal atrial fibrillation ([place])    a. 12/2015 started on Xarelto (CHA2DS2VASc = 4-5).  . Pneumonia   . Prostate cancer ([place])   . Renal cell carcinoma ([place])   . Sleep apnea    Does not like  CPAP  . Spinal stenosis in cervical region 10/12/2016  . Venous insufficiency     SOCIAL HX:  Social History   Tobacco Use  . Smoking status: Former Smoker    Types: Cigars    Quit date: 09/02/1977    Years since quitting: 42.5  . Smokeless tobacco: Never Used  . Tobacco comment:    Substance Use Topics  . Alcohol use: Not Currently    Comment: "stopped drinking alcohol in ~ 1980; just drank a little on the weekends when I did drink"    ALLERGIES:  Allergies  Allergen Reactions  . Amoxicillin Other (See Comments)    Tolerates Unasyn. Can't move Has patient had a PCN reaction causing immediate rash, facial/tongue/throat swelling, SOB or lightheadedness with hypotension: No Has patient had a PCN reaction causing severe rash involving mucus membranes or skin necrosis: No Has patient had a PCN reaction that required hospitalization No Has patient had a PCN reaction occurring within the last 10 years: No If all of the above answers are "NO", then may proceed with Cephalosporin use.   [name] Penicillins Other (See Comments)    Tolerates Unasyn. Can't move/dizziness Has patient had a PCN reaction causing immediate rash, facial/tongue/throat swelling, SOB or lightheadedness with hypotension: No Has patient had a PCN reaction causing severe rash involving mucus membranes or skin necrosis: No Has patient had a PCN reaction that required hospitalization No Has patient had a PCN reaction occurring within the last 10 years: No If  all of the above answers are "NO", then may proceed with Cephalosporin use.       PERTINENT MEDICATIONS:  Outpatient Encounter Medications as of 03/06/2020  Medication Sig  . amLODipine (NORVASC) 10 MG tablet Take 1 tablet (10 mg total) by mouth daily.  [name] atorvastatin (LIPITOR) 40 MG tablet Take 40 mg by mouth daily as needed (pt states he only takes when his cholesterol is high).   . cetirizine (ZYRTEC) 10 MG tablet TAKE 1 TABLET (10 MG TOTAL) BY MOUTH DAILY AS NEEDED (SEASONAL ALLERGIES). (Patient taking differently: Take 10 mg by mouth daily as needed for allergies (seasonal allergies). )  . Cyanocobalamin (VITAMIN B-12 PO) Take 1 tablet by mouth daily.  . diclofenac Sodium (VOLTAREN) 1 % GEL Apply 2 g topically 4 (four) times daily.  [name] glucose blood (ONETOUCH ULTRA) test strip Check FSBS twice a day. Dx: E11.21, N18.32 (Patient taking differently: 1 each by Other route See admin  instructions. Check FSBS twice a day. Dx: E11.21, N18.32)  . HYDROcodone-acetaminophen (NORCO/VICODIN) 5-325 MG tablet Take 1-2 tablets by mouth every 6 (six) hours as needed (gout pain).  . metoprolol tartrate (LOPRESSOR) 25 MG tablet Take 0.5 tablets (12.5 mg total) by mouth 2 (two) times daily.  . multivitamin (RENA-VIT) TABS tablet Take 1 tablet by mouth at bedtime.  . Omega-3 Fatty Acids (FISH OIL PO) Take 1 capsule by mouth daily.   . tamsulosin (FLOMAX) 0.4 MG CAPS capsule Take 1 capsule (0.4 mg total) by mouth daily.  . [EXPIRED] oxyCODONE-acetaminophen (PERCOCET/ROXICET) 5-325 MG per tablet 1 tablet    No facility-administered encounter medications on file as of 03/06/2020.    PHYSICAL EXAM/ROS  General: NAD, cooperative Cardiovascular: regular rate and rhythm; denies chest pain Pulmonary: clear ant fields; no adventitious lung sounds on auscultation Abdomen: soft, nontender, + bowel sounds GU: no suprapubic tenderness Extremities: no edema, no joint deformities Skin: no rashes to exposed  skin Neurological: Weakness but otherwise nonfocal  Teodoro Spray, NP

## 2020-03-06 NOTE — ED Provider Notes (Signed)
[facility] EMERGENCY DEPARTMENT Provider Note   CSN: [id number] Arrival date & time: 03/06/20  0263     History Chief Complaint  Patient presents with  . Leg Pain    [name] [name] is a 74 y.o. male.  74 year old male who presents with worsening chronic right thigh pain.  States he had this for several months and denies any new injuries.  Denies any chest pain or shortness of breath.  Has been evaluated for this in the past without a cause found.  No fever or chills.  Describes the pain as persistent and located in his right medial thigh.  Denies any knee or ankle involvement.  Denies any distal numbness or tingling to his right foot or ankle.  Has been treated with narcotics in the past which she says does help.        Past Medical History:  Diagnosis Date  . Abnormal CT of liver    a. nodular contour suggesting cirrhosis 05/2018.  [name] Abnormal LFTs (liver function tests)   . Anxiety   . Aortic root dilatation ([place])   . Ascending aortic aneurysm ([place])   . BPH with urinary obstruction   . Bradycardia   . 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 CAD (coronary artery disease)    a. 12/2015 NSTEMI/Cath (in setting of PAF):  LM nl, LAD 30p,  LCX 90m, RCA  ok, AM 100%, RPDA1 40, RPDA 2 60 ->Med Rx.  . Childhood asthma   . Chronic diastolic CHF (congestive heart failure) ([place])    a. 12/2015 Echo: EF 50-55%, mod AI, mod Ao root dil, mild MR, mod dil LA, mod RA.  . CKD (chronic kidney disease), stage III   . Colon cancer ([place])   . COPD (chronic obstructive pulmonary disease) ([place])    pt denies at preop  . Diabetes mellitus type II    diet controlled  . History of syncope   . Hyperlipidemia   . Hypertensive heart disease   . Kidney lump 04/04/2010   Overview:  Renal Cell Carcinoma   . 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 Moderate aortic insufficiency    a. 12/2015 Echo: Mod AI.  [name] Paroxysmal  atrial fibrillation ([place])    a. 12/2015 started on Xarelto (CHA2DS2VASc = 4-5).  . Pneumonia   . Prostate cancer ([place])   . Renal cell carcinoma ([place])   . Sleep apnea    Does not like  CPAP  . Spinal stenosis in cervical region 10/12/2016  . Venous insufficiency     Patient Active Problem List   Diagnosis Date Noted  . Aortic root enlargement ([place]) 02/24/2020  . Symptomatic anemia 02/04/2020  . ESRD (end stage renal disease) ([place]) 01/27/2020  . Goals of care, counseling/discussion   . Palliative care by specialist   . DNR (do not resuscitate) discussion   . ARF (acute renal failure) ([place]) 01/22/2020  . Ascending aortic aneurysm ([place]) 08/04/2019  . 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 CKD (chronic kidney disease), stage III 06/04/2019  . Renal cell carcinoma ([place]) 06/04/2019  . Gross hematuria 06/04/2019  . COPD exacerbation ([place]) 11/05/2018  . Palpitations 10/22/2018  . Counseling regarding advance care planning and goals of care 06/22/2018  . AKI (acute kidney injury) ([place])   . Anemia   . Sepsis ([place]) 06/05/2018  . Acute lower UTI 06/05/2018  . Colon polyp 05/21/2018  . Aortic valve  regurgitation 04/14/2018  . Medication management 04/14/2018  . Thoracic aortic aneurysm without rupture ([place]) 09/29/2017  . 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 Head trauma, subsequent encounter 10/12/2016  . 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 Spinal stenosis in cervical region 10/12/2016  . Cervical spondylosis 10/12/2016  . CAD (coronary artery disease)   . Chronic diastolic CHF (congestive heart failure) ([place])   . Hyperlipidemia   . Hypertensive heart disease   . Paroxysmal atrial fibrillation ([place]) 12/17/2015  . 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 Mild intermittent asthma 10/18/2015  . Aortic root dilatation ([place]) 10/17/2015  . Diabetes ([place]) 10/17/2015  . MGUS (monoclonal gammopathy of  unknown significance) 01/17/2015  . Hand paresthesia 12/09/2014  . Elevated prostate specific antigen (PSA) 07/12/2014  . Allergic rhinitis 07/12/2014  . LBP (low back pain) 07/12/2014  . Palpitation 07/11/2014  . Patient's other noncompliance with medication regimen 08/10/2013  . Chronic venous insufficiency 07/23/2012  . Obstructive apnea 04/11/2011  . Essential (primary) hypertension 09/07/2010  . 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 Anxiety, generalized 04/04/2010  . Cardiac conduction disorder 02/27/2010  . BPH (benign prostatic hyperplasia) 09/13/2009  . Abnormal findings on examination of genitourinary organs 08/09/2009  . Hereditary and idiopathic neuropathy 07/05/2009    Past Surgical History:  Procedure Laterality Date  . AV FISTULA PLACEMENT Left 01/26/2020   Procedure: left ARTERIOVENOUS (AV) FISTULA CREATION;  Surgeon: Waynetta Sandy, MD;  Location: [facility];  Service: Vascular;  Laterality: Left;  . BACK SURGERY    . CARDIAC CATHETERIZATION N/A 12/18/2015   Procedure: Left Heart Cath and Coronary Angiography;  Surgeon: Leonie Man, MD;  Location: [facility];  Service: Cardiovascular;  Laterality: N/A;  . DIAGNOSTIC LAPAROSCOPY     partial colectomy  . IR FLUORO GUIDE CV LINE RIGHT  06/28/2019  . IR FLUORO GUIDE CV LINE RIGHT  01/24/2020  . IR REMOVAL TUN CV CATH W/O FL  07/26/2019  . IR US GUIDE VASC ACCESS RIGHT  06/28/2019  . IR US GUIDE VASC ACCESS RIGHT  01/24/2020  . LAPAROSCOPIC PARTIAL COLECTOMY Right 05/21/2018   Procedure: LAPAROSCOPIC RIGHT  COLECTOMY ERAS PATHWAY;  Surgeon: Leighton Ruff, MD;  Location: [facility];  Service: General;  Laterality: Right;  . [place]   "replaced a disc"       Family History  Problem Relation Age of Onset  . Emphysema Father   . Asthma Father   . Liver disease Father        tumor  . Heart disease Mother   . Hypertension Mother   . Asthma Sister   . Hypertension  Son     Social History   Tobacco Use  . Smoking status: Former Smoker    Types: Cigars    Quit date: 09/02/1977    Years since quitting: 42.5  . Smokeless tobacco: Never Used  . Tobacco comment:    Vaping Use  . Vaping Use: Never used  Substance Use Topics  . Alcohol use: Not Currently    Comment: "stopped drinking alcohol in ~ 1980; just drank a little on the weekends when I did drink"  . Drug use: No    Home Medications Prior to Admission medications   Medication Sig Start Date End Date Taking? Authorizing Provider  amLODipine (NORVASC) 10 MG tablet Take 1 tablet (10 mg total) by mouth daily.  01/12/20   [name], DO  atorvastatin (LIPITOR) 40 MG tablet Take 40 mg by mouth daily as needed (pt states he only takes when his cholesterol is high).     [provider]  cetirizine (ZYRTEC) 10 MG tablet TAKE 1 TABLET (10 MG TOTAL) BY MOUTH DAILY AS NEEDED (SEASONAL ALLERGIES). Patient taking differently: Take 10 mg by mouth daily as needed for allergies (seasonal allergies).  11/12/19   [name], DO  Cyanocobalamin (VITAMIN B-12 PO) Take 1 tablet by mouth daily.    [provider]  diclofenac Sodium (VOLTAREN) 1 % GEL Apply 2 g topically 4 (four) times daily. 03/03/20   Dagoberto Ligas, PA-C  glucose blood (ONETOUCH ULTRA) test strip Check FSBS twice a day. Dx: E11.21, N18.32 Patient taking differently: 1 each by Other route See admin instructions. Check FSBS twice a day. Dx: E11.21, N18.32 08/16/19   Fulp, Ander Gaster, MD  HYDROcodone-acetaminophen (NORCO/VICODIN) 5-325 MG tablet Take 1-2 tablets by mouth every 6 (six) hours as needed (gout pain). 12/29/19   Loura Halt A, NP  metoprolol tartrate (LOPRESSOR) 25 MG tablet Take 0.5 tablets (12.5 mg total) by mouth 2 (two) times daily. 01/27/20 04/26/20  [place] Eric J, DO  multivitamin (RENA-VIT) TABS tablet Take 1 tablet by mouth at bedtime. 01/27/20 04/26/20  [place] Eric J, DO  Omega-3 Fatty Acids  (FISH OIL PO) Take 1 capsule by mouth daily.     [provider]  tamsulosin (FLOMAX) 0.4 MG CAPS capsule Take 1 capsule (0.4 mg total) by mouth daily. 10/15/19   [name], DO    Allergies    Amoxicillin and Penicillins  Review of Systems   Review of Systems  All other systems reviewed and are negative.   Physical Exam Updated Vital Signs BP (!) 148/105   Pulse 76   Temp 97.8 F (36.6 C) (Oral)   Resp 18   SpO2 99%   Physical Exam Vitals and nursing note reviewed.  Constitutional:      General: He is not in acute distress.    Appearance: Normal appearance. He is well-developed. He is not toxic-appearing.  HENT:     Head: Normocephalic and atraumatic.  Eyes:     General: Lids are normal.     Conjunctiva/sclera: Conjunctivae normal.     Pupils: Pupils are equal, round, and reactive to light.  Neck:     Thyroid: No thyroid mass.     Trachea: No tracheal deviation.  Cardiovascular:     Rate and Rhythm: Normal rate and regular rhythm.     Heart sounds: Normal heart sounds. No murmur heard.  No gallop.   Pulmonary:     Effort: Pulmonary effort is normal. No respiratory distress.     Breath sounds: Normal breath sounds. No stridor. No decreased breath sounds, wheezing, rhonchi or rales.  Abdominal:     General: Bowel sounds are normal. There is no distension.     Palpations: Abdomen is soft.     Tenderness: There is no abdominal tenderness. There is no rebound.  Musculoskeletal:        General: Normal range of motion.     Cervical back: Normal range of motion and neck supple.     Right upper leg: Tenderness present. No swelling or bony tenderness.       Legs:     Comments: Neurovascular status intact at right foot.  Femoral pulses 2+ on the right  Skin:    General: Skin is warm and  dry.     Findings: No abrasion or rash.  Neurological:     Mental Status: He is alert and oriented to person, place, and time.     GCS: GCS eye subscore is 4. GCS  verbal subscore is 5. GCS motor subscore is 6.     Cranial Nerves: No cranial nerve deficit.     Sensory: No sensory deficit.  Psychiatric:        Speech: Speech normal.        Behavior: Behavior normal.     ED Results / Procedures / Treatments   Labs (all labs ordered are listed, but only abnormal results are displayed) Labs Reviewed  CBC - Abnormal; Notable for the following components:      Result Value   RBC 2.70 (*)    Hemoglobin 8.1 (*)    HCT 24.9 (*)    RDW 15.9 (*)    All other components within normal limits  BASIC METABOLIC PANEL    EKG None  Radiology No results found.  Procedures Procedures (including critical care time)  Medications Ordered in ED Medications  oxyCODONE-acetaminophen (PERCOCET/ROXICET) 5-325 MG per tablet 1 tablet (has no administration in time range)    ED Course  I have reviewed the triage vital signs and the nursing notes.  Pertinent labs & imaging results that were available during my care of the patient were reviewed by me and considered in my medical decision making (see chart for details).    MDM Rules/Calculators/A&P                          Patient with negative Doppler of his right lower extremity.  Was medicated for pain and feels better.  Labs reviewed hemoglobin stable.  Will discharge home with short course of opiates Final Clinical Impression(s) / ED Diagnoses Final diagnoses:  None    Rx / DC Orders ED Discharge Orders    None       Lacretia Leigh, MD 03/06/20 1114

## 2020-03-07 LAB — KAPPA/LAMBDA LIGHT CHAINS
Kappa, lambda light chain ratio: 1776.09 — ABNORMAL HIGH (ref 0.26–1.65)
Lambda free light chains: 21.7 mg/L (ref 5.7–26.3)

## 2020-03-10 ENCOUNTER — Telehealth: Payer: Self-pay

## 2020-03-10 ENCOUNTER — Telehealth: Payer: Self-pay | Admitting: Pharmacist

## 2020-03-10 ENCOUNTER — Telehealth: Payer: Self-pay | Admitting: Hematology

## 2020-03-10 MED ORDER — IXAZOMIB CITRATE 3 MG PO CAPS: ORAL_CAPSULE | ORAL | 1 refills | Status: DC

## 2020-03-10 NOTE — Telephone Encounter (Signed)
Oral Oncology Patient Advocate Encounter  Received notification from [facility] that prior authorization for [name] [name] is required.  PA submitted on CoverMyMeds Key [id number] Status is pending  Oral Oncology Clinic will continue to follow.  [name] Phone [phone] Fax [phone] 03/10/2020 8:44 AM

## 2020-03-10 NOTE — Telephone Encounter (Signed)
Oral Oncology Pharmacist Encounter  Received new prescription for Ninlaro (ixazomib) for the treatment of multiple myeloma, planned duration until disease progression or unacceptable drug toxicity.  BMP from 03/06/20 assessed, SCr elevated/poor renal function. Patients dose reduced to 3 mg. Prescription dose and frequency assessed.   Current medication list in Epic reviewed, no DDIs with ixazomib identified.  Prescription has been e-scribed to the [facility] for benefits analysis and approval.  Oral Oncology Clinic will continue to follow for insurance authorization, copayment issues, initial counseling and start date.  Darl Pikes, PharmD, BCPS, BCOP, CPP Hematology/Oncology Clinical Pharmacist Practitioner [facility] [phone]  03/10/2020 11:23 AM

## 2020-03-10 NOTE — Telephone Encounter (Signed)
Scheduled per los, patient has been called and notified. 

## 2020-03-10 NOTE — Telephone Encounter (Signed)
Oral Oncology Patient Advocate Encounter  Prior Authorization for Kennieth Rad has been approved.    PA# [id number] Effective dates: 03/10/20 through 09/01/20  Patients co-pay is $0  Oral Oncology Clinic will continue to follow.    [place] Patient [place] Phone [phone] Fax [phone] 03/10/2020 11:58 AM

## 2020-03-14 ENCOUNTER — Emergency Department (HOSPITAL_COMMUNITY)
Admission: EM | Admit: 2020-03-14 | Discharge: 2020-03-14 | Disposition: A | Discharge status: Home / Self Care | Payer: Medicare Other | Attending: Emergency Medicine | Admitting: Emergency Medicine

## 2020-03-14 ENCOUNTER — Other Ambulatory Visit: Payer: Self-pay

## 2020-03-14 ENCOUNTER — Encounter (HOSPITAL_COMMUNITY): Payer: Self-pay

## 2020-03-14 DIAGNOSIS — D631 Anemia in chronic kidney disease: Secondary | ICD-10-CM | POA: Diagnosis not present

## 2020-03-14 DIAGNOSIS — D638 Anemia in other chronic diseases classified elsewhere: Secondary | ICD-10-CM

## 2020-03-14 DIAGNOSIS — Z992 Dependence on renal dialysis: Secondary | ICD-10-CM | POA: Diagnosis not present

## 2020-03-14 DIAGNOSIS — E1122 Type 2 diabetes mellitus with diabetic chronic kidney disease: Secondary | ICD-10-CM | POA: Diagnosis not present

## 2020-03-14 DIAGNOSIS — Z79899 Other long term (current) drug therapy: Secondary | ICD-10-CM | POA: Diagnosis not present

## 2020-03-14 DIAGNOSIS — I5032 Chronic diastolic (congestive) heart failure: Secondary | ICD-10-CM | POA: Insufficient documentation

## 2020-03-14 DIAGNOSIS — J449 Chronic obstructive pulmonary disease, unspecified: Secondary | ICD-10-CM | POA: Insufficient documentation

## 2020-03-14 DIAGNOSIS — Z87891 Personal history of nicotine dependence: Secondary | ICD-10-CM | POA: Insufficient documentation

## 2020-03-14 DIAGNOSIS — Z7984 Long term (current) use of oral hypoglycemic drugs: Secondary | ICD-10-CM | POA: Diagnosis not present

## 2020-03-14 DIAGNOSIS — I132 Hypertensive heart and chronic kidney disease with heart failure and with stage 5 chronic kidney disease, or end stage renal disease: Secondary | ICD-10-CM | POA: Diagnosis present

## 2020-03-14 DIAGNOSIS — N186 End stage renal disease: Secondary | ICD-10-CM | POA: Diagnosis not present

## 2020-03-14 LAB — COMPREHENSIVE METABOLIC PANEL
ALT: 11 U/L (ref 0–44)
AST: 21 U/L (ref 15–41)
Albumin: 2.9 g/dL — ABNORMAL LOW (ref 3.5–5.0)
Alkaline Phosphatase: 57 U/L (ref 38–126)
Anion gap: 12 (ref 5–15)
BUN: 34 mg/dL — ABNORMAL HIGH (ref 8–23)
CO2: 29 mmol/L (ref 22–32)
Calcium: 8.6 mg/dL — ABNORMAL LOW (ref 8.9–10.3)
Chloride: 94 mmol/L — ABNORMAL LOW (ref 98–111)
Creatinine, Ser: 7.02 mg/dL — ABNORMAL HIGH (ref 0.61–1.24)
GFR calc Af Amer: 8 mL/min — ABNORMAL LOW (ref >60)
GFR calc non Af Amer: 7 mL/min — ABNORMAL LOW (ref >60)
Glucose, Bld: 131 mg/dL — ABNORMAL HIGH (ref 70–99)
Potassium: 3.6 mmol/L (ref 3.5–5.1)
Sodium: 135 mmol/L (ref 135–145)
Total Bilirubin: 0.5 mg/dL (ref 0.3–1.2)
Total Protein: 6.4 g/dL — ABNORMAL LOW (ref 6.5–8.1)

## 2020-03-14 LAB — PREPARE RBC (CROSSMATCH)

## 2020-03-14 LAB — CBC
HCT: 23.6 % — ABNORMAL LOW (ref 39.0–52.0)
Hemoglobin: 7.7 g/dL — ABNORMAL LOW (ref 13.0–17.0)
MCH: 30.2 pg (ref 26.0–34.0)
MCHC: 32.6 g/dL (ref 30.0–36.0)
MCV: 92.5 fL (ref 80.0–100.0)
Platelets: 237 10*3/uL (ref 150–400)
RBC: 2.55 MIL/uL — ABNORMAL LOW (ref 4.22–5.81)
RDW: 16.5 % — ABNORMAL HIGH (ref 11.5–15.5)
WBC: 6 10*3/uL (ref 4.0–10.5)
nRBC: 0 % (ref 0.0–0.2)

## 2020-03-14 LAB — CBG MONITORING, ED: Glucose-Capillary: 79 mg/dL (ref 70–99)

## 2020-03-14 MED ORDER — SODIUM CHLORIDE 0.9 % IV SOLN: 10.0000 mL/h | Freq: Once | INTRAVENOUS | Status: DC

## 2020-03-14 MED ORDER — HYDROCODONE-ACETAMINOPHEN 5-325 MG PO TABS
1.0000 | ORAL_TABLET | Freq: Once | ORAL | Status: AC
  Administered 2020-03-14: 1 via ORAL
  Filled 2020-03-14: qty 1

## 2020-03-14 NOTE — ED Provider Notes (Signed)
[facility] EMERGENCY DEPARTMENT Provider Note   CSN: [id number] Arrival date & time: 03/14/20  1234     History Chief Complaint  Patient presents with  . Anemia    [name] [name] is a 74 y.o. male with past medical history of ESRD on hemodialysis TuThSa, CAD, COPD, hypertension, multiple myeloma, anemia of chronic disease, presenting the emergency department with complaint of low hemoglobin.  He states he was seen at a clinic earlier today with a low hemoglobin.  He has been having problems with recurrent low hemoglobin, secondary to his end-stage renal disease.  He has had multiple transfusions in the last few months.  He feels the same today with generalized weakness, dyspnea on exertion.  He feels as though his hemoglobin is low again and would benefit from transfusion.  He states he feels much better when his hemoglobin is around 9.  He currently goes to the dialysis clinic you unable to transfuse him during his dialysis treatments.  He received treatment today, and completed all but the last 15 minutes of it.  The history is provided by the patient and medical records.       Past Medical History:  Diagnosis Date  . Abnormal CT of liver    a. nodular contour suggesting cirrhosis 05/2018.  [name] Abnormal LFTs (liver function tests)   . Anxiety   . Aortic root dilatation ([place])   . Ascending aortic aneurysm ([place])   . BPH with urinary obstruction   . Bradycardia   . 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 CAD (coronary artery disease)    a. 12/2015 NSTEMI/Cath (in setting of PAF):  LM nl, LAD 30p,  LCX 31m RCA  ok, AM 100%, RPDA1 40, RPDA 2 60 ->Med Rx.  . Childhood asthma   . Chronic diastolic CHF (congestive heart failure) (HReeds    a. 12/2015 Echo: EF 50-55%, mod AI, mod Ao root dil, mild MR, mod dil LA, mod RA.  . CKD (chronic kidney disease), stage III   . Colon cancer (HTualatin   .  COPD (chronic obstructive pulmonary disease) (HLa Honda    pt denies at preop  . Diabetes mellitus type II    diet controlled  . History of syncope   . Hyperlipidemia   . Hypertensive heart disease   . Kidney lump 04/04/2010   Overview:  Renal Cell Carcinoma   . Light chain myeloma (HHobart   . Moderate aortic insufficiency    a. 12/2015 Echo: Mod AI.  .Marland KitchenParoxysmal atrial fibrillation (HMartin    a. 12/2015 started on Xarelto (CHA2DS2VASc = 4-5).  . Pneumonia   . Prostate cancer (HGibsonia   . Renal cell carcinoma (HOberlin   . Sleep apnea    Does not like  CPAP  . Spinal stenosis in cervical region 10/12/2016  . Venous insufficiency     Patient Active Problem List   Diagnosis Date Noted  . Aortic root enlargement (HBartlett 02/24/2020  . Symptomatic anemia 02/04/2020  . ESRD (end stage renal disease) (HBall 01/27/2020  . Goals of care, counseling/discussion   . Palliative care by specialist   . DNR (do not resuscitate) discussion   . ARF (acute renal failure) (HMcRae-Helena 01/22/2020  . Ascending aortic aneurysm (HGiddings 08/04/2019  . Myeloma kidney (HThompson   . CKD (chronic kidney disease), stage III 06/04/2019  . Renal cell carcinoma (HNorth Muskegon 06/04/2019  . Gross hematuria 06/04/2019  .  COPD exacerbation ([place]) 11/05/2018  . Palpitations 10/22/2018  . Counseling regarding advance care planning and goals of care 06/22/2018  . AKI (acute kidney injury) ([place])   . Anemia   . Sepsis ([place]) 06/05/2018  . Acute lower UTI 06/05/2018  . Colon polyp 05/21/2018  . Aortic valve regurgitation 04/14/2018  . Medication management 04/14/2018  . Thoracic aortic aneurysm without rupture ([place]) 09/29/2017  . 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 Head trauma, subsequent encounter 10/12/2016  . 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 Spinal stenosis in cervical region 10/12/2016  . Cervical spondylosis 10/12/2016  . CAD (coronary artery  disease)   . Chronic diastolic CHF (congestive heart failure) ([place])   . Hyperlipidemia   . Hypertensive heart disease   . Paroxysmal atrial fibrillation ([place]) 12/17/2015  . Light chain myeloma ([place]) 12/17/2015  . Mild intermittent asthma 10/18/2015  . Aortic root dilatation ([place]) 10/17/2015  . Diabetes ([place]) 10/17/2015  . 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 Hand paresthesia 12/09/2014  . Elevated prostate specific antigen (PSA) 07/12/2014  . Allergic rhinitis 07/12/2014  . LBP (low back pain) 07/12/2014  . Palpitation 07/11/2014  . Patient's other noncompliance with medication regimen 08/10/2013  . Chronic venous insufficiency 07/23/2012  . Obstructive apnea 04/11/2011  . Essential (primary) hypertension 09/07/2010  . 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 Anxiety, generalized 04/04/2010  . Cardiac conduction disorder 02/27/2010  . BPH (benign prostatic hyperplasia) 09/13/2009  . Abnormal findings on examination of genitourinary organs 08/09/2009  . Hereditary and idiopathic neuropathy 07/05/2009    Past Surgical History:  Procedure Laterality Date  . AV FISTULA PLACEMENT Left 01/26/2020   Procedure: left ARTERIOVENOUS (AV) FISTULA CREATION;  Surgeon: Waynetta Sandy, MD;  Location: [facility];  Service: Vascular;  Laterality: Left;  . BACK SURGERY    . CARDIAC CATHETERIZATION N/A 12/18/2015   Procedure: Left Heart Cath and Coronary Angiography;  Surgeon: Leonie Man, MD;  Location: [facility];  Service: Cardiovascular;  Laterality: N/A;  . DIAGNOSTIC LAPAROSCOPY     partial colectomy  . IR FLUORO GUIDE CV LINE RIGHT  06/28/2019  . IR FLUORO GUIDE CV LINE RIGHT  01/24/2020  . IR REMOVAL TUN CV CATH W/O FL  07/26/2019  . IR US GUIDE VASC ACCESS RIGHT  06/28/2019  . IR US GUIDE VASC ACCESS RIGHT  01/24/2020  . LAPAROSCOPIC PARTIAL COLECTOMY Right 05/21/2018   Procedure: LAPAROSCOPIC RIGHT  COLECTOMY ERAS PATHWAY;   Surgeon: Leighton Ruff, MD;  Location: [facility];  Service: General;  Laterality: Right;  . [place]   "replaced a disc"       Family History  Problem Relation Age of Onset  . Emphysema Father   . Asthma Father   . Liver disease Father        tumor  . Heart disease Mother   . Hypertension Mother   . Asthma Sister   . Hypertension Son     Social History   Tobacco Use  . Smoking status: Former Smoker    Types: Cigars    Quit date: 09/02/1977    Years since quitting: 42.5  . Smokeless tobacco: Never Used  . Tobacco comment:    Vaping Use  . Vaping Use: Never used  Substance Use Topics  . Alcohol use: Not Currently    Comment: "stopped drinking alcohol in ~  1980; just drank a little on the weekends when I did drink"  . Drug use: No    Home Medications Prior to Admission medications   Medication Sig Start Date End Date Taking? Authorizing Provider  amLODipine (NORVASC) 10 MG tablet Take 1 tablet (10 mg total) by mouth daily. 01/12/20   [name], DO  atorvastatin (LIPITOR) 40 MG tablet Take 40 mg by mouth daily as needed (pt states he only takes when his cholesterol is high).     [provider]  cetirizine (ZYRTEC) 10 MG tablet TAKE 1 TABLET (10 MG TOTAL) BY MOUTH DAILY AS NEEDED (SEASONAL ALLERGIES). Patient taking differently: Take 10 mg by mouth daily as needed for allergies (seasonal allergies).  11/12/19   [name], DO  diclofenac Sodium (VOLTAREN) 1 % GEL Apply 2 g topically 4 (four) times daily. Patient taking differently: Apply 2 g topically 4 (four) times daily as needed (pain).  03/03/20   Dagoberto Ligas, PA-C  glucose blood (ONETOUCH ULTRA) test strip Check FSBS twice a day. Dx: E11.21, N18.32 Patient taking differently: 1 each by Other route See admin instructions. Check FSBS twice a day. Dx: E11.21, N18.32 08/16/19   Fulp, Ander Gaster, MD  HYDROcodone-acetaminophen (NORCO/VICODIN) 5-325 MG tablet Take 1-2 tablets  by mouth every 6 (six) hours as needed (gout pain). Patient not taking: Reported on 03/09/2020 12/29/19   Loura Halt A, NP  ixazomib citrate (NINLARO) 3 MG capsule Take 1 capsule (3 mg) by mouth weekly, 3 weeks on, 1 week off, repeat every 4 weeks. Take on an empty stomach 1hr before or 2hr after meals. 03/10/20   Brunetta Genera, MD  metoprolol tartrate (LOPRESSOR) 25 MG tablet Take 0.5 tablets (12.5 mg total) by mouth 2 (two) times daily. Patient not taking: Reported on 03/09/2020 01/27/20 04/26/20  [place] Eric J, DO  multivitamin (RENA-VIT) TABS tablet Take 1 tablet by mouth at bedtime. Patient not taking: Reported on 03/09/2020 01/27/20 04/26/20  [place] Eric J, DO  oxyCODONE-acetaminophen (PERCOCET/ROXICET) 5-325 MG tablet Take 1 tablet by mouth every 6 (six) hours as needed for severe pain. 03/06/20   Lacretia Leigh, MD  tamsulosin (FLOMAX) 0.4 MG CAPS capsule Take 1 capsule (0.4 mg total) by mouth daily. 10/15/19   [name], DO    Allergies    Amoxicillin and Penicillins  Review of Systems   Review of Systems  Constitutional: Positive for fatigue.  All other systems reviewed and are negative.   Physical Exam Updated Vital Signs BP (!) 176/116   Pulse 87   Temp 98.4 F (36.9 C) (Oral)   Resp 20   Ht 5' 9"  (1.753 m)   Wt 88.9 kg   SpO2 93%   BMI 28.94 kg/m   Physical Exam Vitals and nursing note reviewed.  Constitutional:      General: He is not in acute distress.    Appearance: He is well-developed.  HENT:     Head: Normocephalic and atraumatic.  Eyes:     Conjunctiva/sclera: Conjunctivae normal.  Cardiovascular:     Rate and Rhythm: Normal rate and regular rhythm.     Comments: Right port in place Pulmonary:     Effort: Pulmonary effort is normal. No respiratory distress.     Breath sounds: Normal breath sounds.  Abdominal:     Palpations: Abdomen is soft.  Musculoskeletal:     Right lower leg: Edema present.     Left lower leg: Edema present.    Skin:  General: Skin is warm.  Neurological:     Mental Status: He is alert.  Psychiatric:        Behavior: Behavior normal.     ED Results / Procedures / Treatments   Labs (all labs ordered are listed, but only abnormal results are displayed) Labs Reviewed  COMPREHENSIVE METABOLIC PANEL - Abnormal; Notable for the following components:      Result Value   Chloride 94 (*)    Glucose, Bld 131 (*)    BUN 34 (*)    Creatinine, Ser 7.02 (*)    Calcium 8.6 (*)    Total Protein 6.4 (*)    Albumin 2.9 (*)    GFR calc non Af Amer 7 (*)    GFR calc Af Amer 8 (*)    All other components within normal limits  CBC - Abnormal; Notable for the following components:   RBC 2.55 (*)    Hemoglobin 7.7 (*)    HCT 23.6 (*)    RDW 16.5 (*)    All other components within normal limits  CBG MONITORING, ED  POC OCCULT BLOOD, ED  TYPE AND SCREEN  PREPARE RBC (CROSSMATCH)    EKG None  Radiology No results found.  Procedures Procedures (including critical care time)  Medications Ordered in ED Medications  0.9 %  sodium chloride infusion (has no administration in time range)  HYDROcodone-acetaminophen (NORCO/VICODIN) 5-325 MG per tablet 1 tablet (1 tablet Oral Given 03/14/20 1951)    ED Course  I have reviewed the triage vital signs and the nursing notes.  Pertinent labs & imaging results that were available during my care of the patient were reviewed by me and considered in my medical decision making (see chart for details).    MDM Rules/Calculators/A&P                          Pt with ESRD on HD, requiring multiple blood transfusions for symptomatic anemia of chronic disease, presenting with recurrent generalized weakness and fatigue, DOE. Feels like how he felt when he required transfusions recently. Hgb today is 7.7, VSS. He completed all but the last 15 min of dialysis today, metabolic panel is reassuring. Will transfuse in the ED today, and discharge to home. Discussed  importance of having discussion with his care team regarding need for repeat transfusions and the fact that his dialysis center doesn't offer transfusion with dialysis. Pt agreeable, appropriate for discharge.  Discussed with Dr. Regenia Skeeter.  Final Clinical Impression(s) / ED Diagnoses Final diagnoses:  Anemia of chronic disease    Rx / DC Orders ED Discharge Orders    None       Maven [name], [name] N, PA-C 03/14/20 2334    Sherwood Gambler, MD 03/15/20 [phone]

## 2020-03-14 NOTE — Discharge Instructions (Addendum)
Please follow-up closely with your kidney doctor.  Discussed the recurring need for blood transfusions and possible transfer to another dialysis clinic where you can receive blood transfusion during dialysis.

## 2020-03-14 NOTE — ED Notes (Signed)
Pt signed blood consent form

## 2020-03-14 NOTE — ED Triage Notes (Signed)
Pt sent here by doctor for low hgb but pt does not know what the level is, pt denies blood in stool. Pt is a dialysis pt, did not receive full treatment today due to feeling weak. Pt also c.o his chronic leg pain.

## 2020-03-15 ENCOUNTER — Telehealth: Payer: Self-pay | Admitting: Internal Medicine

## 2020-03-15 LAB — BPAM RBC
Blood Product Expiration Date: 202107202359
Blood Product Expiration Date: 202108082359
ISSUE DATE / TIME: 202107132031
Unit Type and Rh: 6200
Unit Type and Rh: 6200

## 2020-03-15 LAB — TYPE AND SCREEN
ABO/RH(D): A POS
Antibody Screen: NEGATIVE
Unit division: 0
Unit division: 0

## 2020-03-15 MED FILL — NINLARO 3 MG CAPS: 3 | 28 days supply | Qty: 3 | Fill #0

## 2020-03-15 NOTE — Telephone Encounter (Signed)
Oral Chemotherapy Pharmacist Encounter  [name] [name] plan on picking up his Xeloda from [place] today 03/15/20.  Patient Education I spoke with patient for overview of new oral chemotherapy medication: Ninlaro (ixazomib) for the treatment of multiple myeloma, planned duration until disease progression or unacceptable drug toxicity.   Pt is doing well. Counseled patient on administration, dosing, side effects, monitoring, drug-food interactions, safe handling, storage, and disposal. Patient will take 1 capsule (3 mg) by mouth weekly, 3 weeks on, 1 week off, repeat every 4 weeks. Take on an empty stomach 1hr before or 2hr after meals.  Side effects include but not limited to: constipation or diarrhea, decreased wbc/plt.    Reviewed with patient importance of keeping a medication schedule and plan for any missed doses.  [name] [name] voiced understanding and appreciation. All questions answered. Medication handout placed in the mail.  Provided patient with [facility] phone number. Patient knows to call the office with questions or concerns. Oral Chemotherapy Navigation Clinic will continue to follow.  Darl Pikes, PharmD, BCPS, BCOP, CPP Hematology/Oncology Clinical Pharmacist Practitioner [facility] [phone]  03/15/2020 1:51 PM

## 2020-03-15 NOTE — Telephone Encounter (Signed)
Pt daughter called asking for at home severs

## 2020-03-16 NOTE — Telephone Encounter (Signed)
Oral Chemotherapy Pharmacist Encounter   Spoke with patient's son Kemon Devincenzi for [employer] education. He stated that he just picked up the medication from [facility].  Darl Pikes, PharmD, BCPS, BCOP, CPP Hematology/Oncology Clinical Pharmacist [facility] [phone]  03/16/2020 4:02 PM

## 2020-03-16 NOTE — Telephone Encounter (Signed)
[id number] form initiated & placed in provider's office for completion.

## 2020-03-20 ENCOUNTER — Encounter (HOSPITAL_COMMUNITY)
Admission: RE | Admit: 2020-03-20 | Discharge: 2020-03-20 | Disposition: A | Discharge status: Home / Self Care | Payer: Medicare Other | Source: Ambulatory Visit | Attending: Hematology | Admitting: Hematology

## 2020-03-20 ENCOUNTER — Other Ambulatory Visit: Payer: Self-pay

## 2020-03-20 ENCOUNTER — Inpatient Hospital Stay (HOSPITAL_COMMUNITY): Admission: RE | Admit: 2020-03-20 | Payer: Medicare Other | Source: Ambulatory Visit

## 2020-03-20 DIAGNOSIS — C9000 Multiple myeloma not having achieved remission: Secondary | ICD-10-CM | POA: Insufficient documentation

## 2020-03-20 DIAGNOSIS — C649 Malignant neoplasm of unspecified kidney, except renal pelvis: Secondary | ICD-10-CM | POA: Diagnosis present

## 2020-03-20 DIAGNOSIS — M25451 Effusion, right hip: Secondary | ICD-10-CM | POA: Insufficient documentation

## 2020-03-20 DIAGNOSIS — I719 Aortic aneurysm of unspecified site, without rupture: Secondary | ICD-10-CM | POA: Diagnosis not present

## 2020-03-20 DIAGNOSIS — I700 Atherosclerosis of aorta: Secondary | ICD-10-CM | POA: Diagnosis not present

## 2020-03-20 DIAGNOSIS — I712 Thoracic aortic aneurysm, without rupture: Secondary | ICD-10-CM | POA: Diagnosis not present

## 2020-03-20 LAB — GLUCOSE, CAPILLARY: Glucose-Capillary: 122 mg/dL — ABNORMAL HIGH (ref 70–99)

## 2020-03-20 MED ORDER — FLUDEOXYGLUCOSE F - 18 (FDG) INJECTION
10.2000 | Freq: Once | INTRAVENOUS | Status: AC | PRN
  Administered 2020-03-20: 10.2 via INTRAVENOUS

## 2020-03-21 ENCOUNTER — Other Ambulatory Visit: Payer: Self-pay

## 2020-03-21 ENCOUNTER — Emergency Department (HOSPITAL_COMMUNITY)
Admission: EM | Admit: 2020-03-21 | Discharge: 2020-03-21 | Disposition: A | Discharge status: Home / Self Care | Payer: Medicare Other | Attending: Emergency Medicine | Admitting: Emergency Medicine

## 2020-03-21 ENCOUNTER — Encounter (HOSPITAL_COMMUNITY): Payer: Self-pay | Admitting: Vascular Surgery

## 2020-03-21 ENCOUNTER — Emergency Department (HOSPITAL_COMMUNITY): Payer: Medicare Other

## 2020-03-21 ENCOUNTER — Telehealth: Payer: Self-pay

## 2020-03-21 DIAGNOSIS — E1122 Type 2 diabetes mellitus with diabetic chronic kidney disease: Secondary | ICD-10-CM | POA: Diagnosis not present

## 2020-03-21 DIAGNOSIS — I132 Hypertensive heart and chronic kidney disease with heart failure and with stage 5 chronic kidney disease, or end stage renal disease: Secondary | ICD-10-CM | POA: Insufficient documentation

## 2020-03-21 DIAGNOSIS — Z79899 Other long term (current) drug therapy: Secondary | ICD-10-CM | POA: Insufficient documentation

## 2020-03-21 DIAGNOSIS — Z992 Dependence on renal dialysis: Secondary | ICD-10-CM | POA: Diagnosis not present

## 2020-03-21 DIAGNOSIS — Z87891 Personal history of nicotine dependence: Secondary | ICD-10-CM | POA: Insufficient documentation

## 2020-03-21 DIAGNOSIS — M791 Myalgia, unspecified site: Secondary | ICD-10-CM | POA: Insufficient documentation

## 2020-03-21 DIAGNOSIS — R002 Palpitations: Secondary | ICD-10-CM | POA: Insufficient documentation

## 2020-03-21 DIAGNOSIS — J449 Chronic obstructive pulmonary disease, unspecified: Secondary | ICD-10-CM | POA: Insufficient documentation

## 2020-03-21 DIAGNOSIS — R0602 Shortness of breath: Secondary | ICD-10-CM | POA: Insufficient documentation

## 2020-03-21 DIAGNOSIS — M79604 Pain in right leg: Secondary | ICD-10-CM | POA: Insufficient documentation

## 2020-03-21 DIAGNOSIS — Z85528 Personal history of other malignant neoplasm of kidney: Secondary | ICD-10-CM | POA: Diagnosis not present

## 2020-03-21 DIAGNOSIS — Z8546 Personal history of malignant neoplasm of prostate: Secondary | ICD-10-CM | POA: Diagnosis not present

## 2020-03-21 DIAGNOSIS — N186 End stage renal disease: Secondary | ICD-10-CM | POA: Diagnosis not present

## 2020-03-21 DIAGNOSIS — I2510 Atherosclerotic heart disease of native coronary artery without angina pectoris: Secondary | ICD-10-CM | POA: Diagnosis not present

## 2020-03-21 DIAGNOSIS — I5032 Chronic diastolic (congestive) heart failure: Secondary | ICD-10-CM | POA: Insufficient documentation

## 2020-03-21 LAB — BASIC METABOLIC PANEL
Anion gap: 15 (ref 5–15)
BUN: 57 mg/dL — ABNORMAL HIGH (ref 8–23)
CO2: 29 mmol/L (ref 22–32)
Calcium: 9.8 mg/dL (ref 8.9–10.3)
Chloride: 91 mmol/L — ABNORMAL LOW (ref 98–111)
Creatinine, Ser: 8.61 mg/dL — ABNORMAL HIGH (ref 0.61–1.24)
GFR calc Af Amer: 6 mL/min — ABNORMAL LOW (ref >60)
GFR calc non Af Amer: 5 mL/min — ABNORMAL LOW (ref >60)
Glucose, Bld: 93 mg/dL (ref 70–99)
Potassium: 4 mmol/L (ref 3.5–5.1)
Sodium: 135 mmol/L (ref 135–145)

## 2020-03-21 LAB — CBC
HCT: 25.8 % — ABNORMAL LOW (ref 39.0–52.0)
Hemoglobin: 8.1 g/dL — ABNORMAL LOW (ref 13.0–17.0)
MCH: 29.7 pg (ref 26.0–34.0)
MCHC: 31.4 g/dL (ref 30.0–36.0)
MCV: 94.5 fL (ref 80.0–100.0)
Platelets: 277 10*3/uL (ref 150–400)
RBC: 2.73 MIL/uL — ABNORMAL LOW (ref 4.22–5.81)
RDW: 17.2 % — ABNORMAL HIGH (ref 11.5–15.5)
WBC: 7.1 10*3/uL (ref 4.0–10.5)
nRBC: 0.4 % — ABNORMAL HIGH (ref 0.0–0.2)

## 2020-03-21 LAB — TYPE AND SCREEN
ABO/RH(D): A POS
Antibody Screen: NEGATIVE

## 2020-03-21 MED ORDER — METOPROLOL TARTRATE 5 MG/5ML IV SOLN
5.0000 mg | Freq: Once | INTRAVENOUS | Status: AC
  Administered 2020-03-21: 5 mg via INTRAVENOUS
  Filled 2020-03-21: qty 5

## 2020-03-21 MED ORDER — OXYCODONE-ACETAMINOPHEN 5-325 MG PO TABS
1.0000 | ORAL_TABLET | Freq: Once | ORAL | Status: AC
  Administered 2020-03-21: 1 via ORAL
  Filled 2020-03-21: qty 1

## 2020-03-21 MED ORDER — TECHNETIUM TO 99M ALBUMIN AGGREGATED
3.8000 | Freq: Once | INTRAVENOUS | Status: AC | PRN
  Administered 2020-03-21: 3.8 via INTRAVENOUS

## 2020-03-21 MED ORDER — METOPROLOL TARTRATE 25 MG PO TABS
12.5000 mg | ORAL_TABLET | Freq: Once | ORAL | Status: AC
  Administered 2020-03-21: 12.5 mg via ORAL
  Filled 2020-03-21: qty 1

## 2020-03-21 NOTE — ED Notes (Signed)
Patient verbalizes understanding of discharge instructions. Opportunity for questioning and answers were provided. Armband removed by staff, pt discharged from ED stable & ambulatory  

## 2020-03-21 NOTE — ED Notes (Signed)
Pt transported to VQ scan via stretcher

## 2020-03-21 NOTE — Telephone Encounter (Signed)
Telephone call received from preadmission nurse Helene Kelp who stated she spoke with pt and reported a rapid HR, legs weak and had to end dialysis early today. Pt plans to proceed to the ER for evaluation. Unsure if plans to proceed with scheduled second stage BVT Lt arm on tomorrow with Dr. Donzetta Matters. Advised will inform provider of information.

## 2020-03-21 NOTE — Progress Notes (Signed)
Called pt for pre-op call. The first thing pt tells me is "I think I need to call the paramedics". I asked him what was going on and he said my legs feel weak, my heart is beating hard and fast. I couldn't finish dialysis because I felt so bad. I think my blood is low". I told him that we would hang up and he needs to call 911, again he said "yes, I need to call the paramedics".  I notified Dr. Claretha Cooper office, spoke with Gretchen Short, RN.

## 2020-03-21 NOTE — Progress Notes (Signed)
°  °  I discussed with patient currently has had some palpitations.  Labs were reviewed and discussed with emergency department team.  If patient stays overnight we will plan for second stage fistula if he has improved tomorrow otherwise will need to reschedule.  Servando Snare

## 2020-03-21 NOTE — Progress Notes (Addendum)
Anesthesia Chart Review: [name] [name]   Case: [id number] Date/Time: 03/22/20 0946   Procedure: SECOND STAGE BASCILIC VEIN TRANSPOSITION LEFT ARM (Left )   Anesthesia type: Monitor Anesthesia Care   Pre-op diagnosis: END STAGE RENAL DISEASE   Location: [facility] ROOM 12 / [place]   Surgeons: Waynetta Sandy, MD      DISCUSSION: Patient is a 74 year old male scheduled for the above procedure. He is s/p right IJ tunneled hemodialysis catheter on r/01/24/20 and s/p first stage left basilic vein transposition on 01/26/20.   History includes former smoker (quit 09/02/77), CAD (NSTEMI, 100% acute marginal. 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  Last cardiology evaluation on 02/25/20 by Minus Breeding, MD for palpitations, possibly related to anemia. No significant arrhythmias or recent monitor and denied syncope/presyncope, so no change in therapy recommended. Known PAF, but not a good candidate for anticoagulation. No chest pain, so no change in CAD management. CHF managed with dialysis. He does have moderate AI, but not good surgical candidate, so conserative management for now with consideration of follow-up echo in 1 year. He did not think he would be a candidate for repair of aortic enlargement (TAA).  Six month follow-up planned.   He had previously planned to stop all treatment and had refused Hospice; however, at 03/03/20 oncology follow-up with Dr. Irene Limbo, he wanted to pursue treatment of multiple myeloma and renal cancer. He was  started on Ninlaro.  + COVID-19 test on 02/04/20, now > 21 days out from positive test. Anesthesia team to evaluate on the day of surgery. Known anemia, with H/H 7.7/23.6 on 03/14/20. He is for ISTAT on arrival.    ADDENDUM 03/21/20 4:05 PM: When PAT RN contacted patient, he reported palpitations and desire to contact EMS. He was concerned he was more anemic as this exacerbated his palpitations in the past. He did contact EMS and is currently in the [facility] ED. Dr. Claretha Cooper office was notified. HGB appears stable at 8.1. He is hypertensive with BP 172/108 and tachycardic at 119-122 bpm. O2 sat 95-98%. Currently work-up is in process, so it is unclear if he can still proceed with planned dialysis access procedure as scheduled. Would want BP and HR improved for surgery.    PROVIDERS: [name], DO is listed as PCP Minus Breeding, MD is cardiologist Sullivan Lone, MD is HEM-ON[facility] Louis Meckel, MD is urologist   LABS: 03/21/20 1435 labs in ED showed Cr 8.61, K 4.0, H/H 8.1/25.8, PLT 277. A1c 5.2% on 06/26/19.  HGB 8.1 on 03/21/20, 7.7 03/14/20, 8.1 03/06/20, 8.6 03/03/20, 7.2 02/25/20.   IMAGES: 1V PCXR 03/21/20: FINDINGS: Right-sided dialysis catheter in similar position. There is cardiomegaly. No vascular congestion or edema. No focal consolidation, pleural effusion, or pneumothorax. No acute osseous pathology. IMPRESSION: 1. No acute cardiopulmonary process.  No interval change.  The 2. Cardiomegaly.  PET Scan 03/20/20: IMPRESSION: 1. Enlarging LEFT renal mass with increased FDG uptake, slightly diminished uptake compared to the previous exam but with findings of presumed central necrosis. 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 2. Interval development of  diffuse periarticular activity about the shoulders hips and to a lesser extent RIGHT sternoclavicular joint. Mile a involving the synovium of these joints is considered. Pattern could  also be seen in the setting of septic arthritis. Small effusion of the RIGHT hip has enlarged with new effusion on the LEFT since previous imaging. Aspiration may be helpful for further evaluation. 3. New areas of bony uptake particularly in the LEFT proximal humerus and RIGHT iliac bone also suspicious for involvement from myeloma. Renal cell with metastatic disease is also considered. Biopsy will be helpful to determine whether this is an alternative diagnosis. 4. T1 lesion and posterior element uptake suspicious for additional sites of disease. 5. Thoracic aortic aneurysm of similar caliber to previous exam. 6.  Aortic aneurysm NOS (ICD10-I71.9). 7.  Aortic Atherosclerosis (ICD10-I70.0).  CT Head/C-spine 10/11/16: IMPRESSION: 1. No acute intracranial findings or skull fracture. 2. Stable age related cerebral atrophy, ventriculomegaly and periventricular white matter disease. 3. Left maxillary sinus disease. 4. Advanced degenerative cervical spondylosis with multilevel disc disease and facet disease but no acute cervical spine fracture.   EKG: E[facility] 03/21/20 14:06:[facility]): Sinus or ectopic atrial tachycardia Incomplete RBBB and LAFB Anteroseptal infarct, age indeterminate   EKG 03/14/20: Ventricular rate 110 bpm Sinus tachycardia with occasional Premature ventricular complexes Right superior axis deviation No significant change since last tracing Confirmed by Blanchie Dessert [phone]) on 03/15/2020 9:12:46 PM   CV: Cardiac event monitor 10/04/19-10/17/19: Study Highlights NSR Rare ectopy. No sustained arrhythmias.   Echo 07/28/19: IMPRESSIONS  1. Left ventricular ejection fraction, by visual estimation, is 55 to  60%. The left ventricle has low normal function. There is moderately  increased left ventricular hypertrophy.  2. Left ventricular diastolic parameters are consistent with Grade I  diastolic dysfunction (impaired relaxation).  3. Mild to moderately  dilated left ventricular internal cavity size.  4. The left ventricle has no regional wall motion abnormalities.  5. Global right ventricle has normal systolic function.The right  ventricular size is normal. No increase in right ventricular wall  thickness.  6. Left atrial size was severely dilated.  7. Right atrial size was severely dilated.  8. Mild mitral annular calcification.  9. The mitral valve is degenerative. Mild to moderate mitral valve  regurgitation.  10. The tricuspid valve is normal in structure. Tricuspid valve  regurgitation is mild.  11. Aortic valve regurgitation is moderate.  12. The aortic valve is tricuspid. Aortic valve regurgitation is moderate.  Mild aortic valve sclerosis without stenosis.  13. The pulmonic valve was grossly normal. Pulmonic valve regurgitation is  trivial.  14. Aneurysm of the ascending aorta and aortic root.  15. Aortic dilatation noted.  16. There is mild dilatation of the aortic root and of the ascending aorta  measuring 48 mm.  17. Mildly elevated pulmonary artery systolic pressure.  18. The tricuspid regurgitant velocity is 2.31 m/s, and with an assumed  right atrial pressure of 15 mmHg, the estimated right ventricular systolic  pressure is mildly elevated at 36.3 mmHg.  19. The inferior vena cava is dilated in size with <50% respiratory  variability, suggesting right atrial pressure of 15 mmHg.  20. There is right bowing of the interatrial septum, suggestive of  elevated left atrial pressure.    Cardiac cath 12/19/15:  Acute Mrg lesion, 100% stenosed.  RPDA-2 lesion, 60% stenosed.  RPDA-1 lesion, 40% stenosed.  Mid Cx lesion, 30% stenosed.  Prox LAD lesion, 30% stenosed. 1. Nonobstructive disease in the LCA Plan: medical management.  Past Medical History:  Diagnosis Date  . Abnormal CT of liver    a. nodular contour suggesting cirrhosis 05/2018.  [name] Abnormal LFTs (liver function tests)   . Anxiety   . Aortic  root dilatation ([place])   . Ascending aortic aneurysm ([place])   . BPH with urinary obstruction   . Bradycardia   . 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 CAD (coronary artery disease)    a. 12/2015 NSTEMI/Cath (in setting of PAF):  LM nl, LAD 30p,  LCX 7m RCA  ok, AM 100%, RPDA1 40, RPDA 2 60 ->Med Rx.  . Childhood asthma   . Chronic diastolic CHF (congestive heart failure) ([place]    a. 12/2015 Echo: EF 50-55%, mod AI, mod Ao root dil, mild MR, mod dil LA, mod RA.  . CKD (chronic kidney disease), stage III   . Colon cancer ([place]   . COPD (chronic obstructive pulmonary disease) ([place]    pt denies at preop  . Diabetes mellitus type II    diet controlled  . History of syncope   . Hyperlipidemia   . Hypertensive heart disease   . Kidney lump 04/04/2010   Overview:  Renal Cell Carcinoma   . Light chain myeloma ([place]   . Moderate aortic insufficiency    a. 12/2015 Echo: Mod AI.  .Marland KitchenParoxysmal atrial fibrillation ([place]    a. 12/2015 started on Xarelto (CHA2DS2VASc = 4-5).  . Pneumonia   . Prostate cancer ([place]   . Renal cell carcinoma ([place]   . Sleep apnea    Does not like  CPAP  . Spinal stenosis in cervical region 10/12/2016  . Venous insufficiency     Past Surgical History:  Procedure Laterality Date  . AV FISTULA PLACEMENT Left 01/26/2020   Procedure: left ARTERIOVENOUS (AV) FISTULA CREATION;  Surgeon: CWaynetta Sandy MD;  [place]  Service: Vascular;  Laterality: Left;  . BACK SURGERY    . CARDIAC CATHETERIZATION N/A 12/18/2015   Procedure: Left Heart Cath and Coronary Angiography;  Surgeon: DLeonie Man MD;  [place];  Service: Cardiovascular;  Laterality: N/A;  . DIAGNOSTIC LAPAROSCOPY     partial colectomy  . IR FLUORO GUIDE CV LINE RIGHT  06/28/2019  . IR FLUORO GUIDE CV LINE RIGHT  01/24/2020  . IR REMOVAL TUN CV CATH W/O FL  07/26/2019  . IR [place]  ACCESS RIGHT  06/28/2019  . IR [place] VASC ACCESS RIGHT  01/24/2020  . LAPAROSCOPIC PARTIAL COLECTOMY Right 05/21/2018   Procedure: LAPAROSCOPIC RIGHT  COLECTOMY ERAS PATHWAY;  Surgeon: TLeighton Ruff MD;  Location: [facility];  Service: General;  Laterality: Right;  . [place]  "replaced a disc"    MEDICATIONS: No current facility-administered medications for this encounter.   .Marland KitchenamLODipine (NORVASC) 10 MG tablet  . atorvastatin (LIPITOR) 40 MG tablet  . cetirizine (ZYRTEC) 10 MG tablet  . diclofenac Sodium (VOLTAREN) 1 % GEL  . oxyCODONE-acetaminophen (PERCOCET/ROXICET) 5-325 MG tablet  . tamsulosin (FLOMAX) 0.4 MG CAPS capsule  . glucose blood (ONETOUCH ULTRA) test strip  . HYDROcodone-acetaminophen (NORCO/VICODIN) 5-325 MG tablet  . ixazomib citrate (NINLARO) 3 MG capsule  . metoprolol tartrate (LOPRESSOR) 25 MG tablet  . multivitamin (RENA-VIT) TABS tablet    AMyra Gianotti PA-C Surgical Short Stay/Anesthesiology [facility] HsptlPhone ([phone] Head HospitalPhone ([phone]/20/2021 12:55 PM

## 2020-03-21 NOTE — Anesthesia Preprocedure Evaluation (Deleted)
Anesthesia Evaluation    Reviewed: Allergy & Precautions, Patient's Chart, lab work & pertinent test results, reviewed documented beta blocker date and time   History of Anesthesia Complications Negative for: history of anesthetic complications  Airway        Dental   Pulmonary asthma , sleep apnea (noncompliant with CPAP) , COPD, former smoker,           Cardiovascular hypertension, Pt. on home beta blockers and Pt. on medications + CAD, + Past MI (2017) and +CHF  + dysrhythmias (on Xarelto) Atrial Fibrillation   TTE 07/2019: EF 55-60%, moderate LVH, grade 1 DD, mild to moderate LVE, severe LAE/RAE, mild to moderate MR, mild TR, moderate AR, mild dilatation of aortic root and ascending aorta measuring 48 mm, mildly elevated PASP, RVSP 36.3 mmHg    Neuro/Psych Anxiety cervical spinal stenosis    GI/Hepatic negative GI ROS, Neg liver ROS,   Endo/Other  diabetes, Type 2  Renal/GU ESRF and DialysisRenal disease  negative genitourinary   Musculoskeletal  (+) Arthritis ,   Abdominal   Peds  Hematology  (+) anemia , Hgb 8.1   Anesthesia Other Findings Day of surgery medications reviewed with patient.  Reproductive/Obstetrics negative OB ROS                           Anesthesia Physical Anesthesia Plan  ASA: IV  Anesthesia Plan: MAC   Post-op Pain Management:    Induction:   PONV Risk Score and Plan: 1 and Propofol infusion, Ondansetron and Treatment may vary due to age or medical condition  Airway Management Planned: Natural Airway and Simple Face Mask  Additional Equipment: None  Intra-op Plan:   Post-operative Plan:   Informed Consent:   Plan Discussed with:   Anesthesia Plan Comments: (See PAT note written 03/21/2020 by Myra Gianotti, PA-C. In the ED for palpitations as of 03/21/20 4:14 PM. Case cancelled 03/22/20. )     Anesthesia Quick Evaluation

## 2020-03-21 NOTE — Discharge Instructions (Addendum)
You were seen in the emergency room today with elevated heart rate, right leg pain, weakness.  Your symptoms were evaluated with imaging of your chest along with blood work.  Please be sure to take your metoprolol as a seem to have improved your vital signs and symptoms. Please follow closely with your PCP and return to the ED with any new or worsening symptoms.

## 2020-03-21 NOTE — ED Provider Notes (Signed)
[facility] Provider Note   CSN: [id number] Arrival date & time: 03/21/20  1406     History Chief Complaint  Patient presents with  . Palpitations    [name] [name] is a 74 y.o. male.  Patient with past medical history of ESRD on hemodialysis T/Th/Sa, CAD, COPD, hypertension, multiple myeloma, anemia of chronic disease, presenting the emergency department with complaint of right leg pain (chronic, negative Doppler earlier this month), generalized weakness and shortness of breath.  Patient states that he receives frequent blood transfusions, including an emergency department 2 times over the past month or so.  He states that his symptoms are similar to when his hemoglobin is low.  He is requesting blood products today.  Patient had dialysis this morning.  Per report, he received 2.5 out of 3 hours of dialysis.  He has not taken his medication today, including metoprolol.  No fevers or cough.  Onset of symptoms gradual.  Nothing makes symptoms better or worse.  Patient transported by EMS.  Found to be tachycardic.        Past Medical History:  Diagnosis Date  . Abnormal CT of liver    a. nodular contour suggesting cirrhosis 05/2018.  [name] Abnormal LFTs (liver function tests)   . Anxiety   . Aortic root dilatation ([place])   . Ascending aortic aneurysm ([place])   . BPH with urinary obstruction   . Bradycardia   . 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 CAD (coronary artery disease)    a. 12/2015 NSTEMI/Cath (in setting of PAF):  LM nl, LAD 30p,  LCX 70m RCA  ok, AM 100%, RPDA1 40, RPDA 2 60 ->Med Rx.  . Childhood asthma   . Chronic diastolic CHF (congestive heart failure) ([place]    a. 12/2015 Echo: EF 50-55%, mod AI, mod Ao root dil, mild MR, mod dil LA, mod RA.  . CKD (chronic kidney disease), stage III   . Colon cancer ([place]   . COPD (chronic obstructive pulmonary  disease) ([place]    pt denies at preop  . Diabetes mellitus type II    diet controlled  . History of syncope   . Hyperlipidemia   . Hypertensive heart disease   . Kidney lump 04/04/2010   Overview:  Renal Cell Carcinoma   . 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 Moderate aortic insufficiency    a. 12/2015 Echo: Mod AI.  .Marland KitchenParoxysmal atrial fibrillation ([place]    a. 12/2015 started on Xarelto (CHA2DS2VASc = 4-5).  . Pneumonia   . Prostate cancer ([place]   . Renal cell carcinoma ([place]   . Sleep apnea    Does not like  CPAP  . Spinal stenosis in cervical region 10/12/2016  . Venous insufficiency     Patient Active Problem List   Diagnosis Date Noted  . Aortic root enlargement ([place] 02/24/2020  . Symptomatic anemia 02/04/2020  . ESRD (end stage renal disease) ([place] 01/27/2020  . Goals of care, counseling/discussion   . Palliative care by specialist   . DNR (do not resuscitate) discussion   . ARF (acute renal failure) ([place] 01/22/2020  . Ascending aortic aneurysm ([place] 08/04/2019  . Myeloma kidney ([facility]   . CKD (chronic kidney disease), stage III 06/04/2019  . Renal cell carcinoma ([place] 06/04/2019  . Gross hematuria 06/04/2019  . COPD exacerbation ([place] 11/05/2018  . Palpitations 10/22/2018  . Counseling regarding  advance care planning and goals of care 06/22/2018  . AKI (acute kidney injury) ([place])   . Anemia   . Sepsis ([place]) 06/05/2018  . Acute lower UTI 06/05/2018  . Colon polyp 05/21/2018  . Aortic valve regurgitation 04/14/2018  . Medication management 04/14/2018  . Thoracic aortic aneurysm without rupture ([place]) 09/29/2017  . 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 Head trauma, subsequent encounter 10/12/2016  . 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 Spinal stenosis in cervical region 10/12/2016  . Cervical spondylosis 10/12/2016  . CAD (coronary artery disease)   . Chronic diastolic CHF  (congestive heart failure) ([place])   . Hyperlipidemia   . Hypertensive heart disease   . Paroxysmal atrial fibrillation ([place]) 12/17/2015  . 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 Mild intermittent asthma 10/18/2015  . Aortic root dilatation ([place]) 10/17/2015  . Diabetes ([place]) 10/17/2015  . 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 Hand paresthesia 12/09/2014  . Elevated prostate specific antigen (PSA) 07/12/2014  . Allergic rhinitis 07/12/2014  . LBP (low back pain) 07/12/2014  . Palpitation 07/11/2014  . Patient's other noncompliance with medication regimen 08/10/2013  . Chronic venous insufficiency 07/23/2012  . Obstructive apnea 04/11/2011  . Essential (primary) hypertension 09/07/2010  . 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 Anxiety, generalized 04/04/2010  . Cardiac conduction disorder 02/27/2010  . BPH (benign prostatic hyperplasia) 09/13/2009  . Abnormal findings on examination of genitourinary organs 08/09/2009  . Hereditary and idiopathic neuropathy 07/05/2009    Past Surgical History:  Procedure Laterality Date  . AV FISTULA PLACEMENT Left 01/26/2020   Procedure: left ARTERIOVENOUS (AV) FISTULA CREATION;  Surgeon: Waynetta Sandy, MD;  Location: [facility];  Service: Vascular;  Laterality: Left;  . BACK SURGERY    . CARDIAC CATHETERIZATION N/A 12/18/2015   Procedure: Left Heart Cath and Coronary Angiography;  Surgeon: Leonie Man, MD;  Location: [facility];  Service: Cardiovascular;  Laterality: N/A;  . DIAGNOSTIC LAPAROSCOPY     partial colectomy  . IR FLUORO GUIDE CV LINE RIGHT  06/28/2019  . IR FLUORO GUIDE CV LINE RIGHT  01/24/2020  . IR REMOVAL TUN CV CATH W/O FL  07/26/2019  . IR US GUIDE VASC ACCESS RIGHT  06/28/2019  . IR US GUIDE VASC ACCESS RIGHT  01/24/2020  . LAPAROSCOPIC PARTIAL COLECTOMY Right 05/21/2018   Procedure: LAPAROSCOPIC RIGHT  COLECTOMY ERAS PATHWAY;  Surgeon: Leighton Ruff, MD;   Location: [facility];  Service: General;  Laterality: Right;  . [place]   "replaced a disc"       Family History  Problem Relation Age of Onset  . Emphysema Father   . Asthma Father   . Liver disease Father        tumor  . Heart disease Mother   . Hypertension Mother   . Asthma Sister   . Hypertension Son     Social History   Tobacco Use  . Smoking status: Former Smoker    Types: Cigars    Quit date: 09/02/1977    Years since quitting: 42.5  . Smokeless tobacco: Never Used  . Tobacco comment:    Vaping Use  . Vaping Use: Never used  Substance Use Topics  . Alcohol use: Not Currently    Comment: "stopped drinking alcohol in ~ 1980; just drank a little on the weekends when I did drink"  .  Drug use: No    Home Medications Prior to Admission medications   Medication Sig Start Date End Date Taking? Authorizing Provider  amLODipine (NORVASC) 10 MG tablet Take 1 tablet (10 mg total) by mouth daily. 01/12/20   [name], DO  atorvastatin (LIPITOR) 40 MG tablet Take 40 mg by mouth daily as needed (pt states he only takes when his cholesterol is high).     [provider]  cetirizine (ZYRTEC) 10 MG tablet TAKE 1 TABLET (10 MG TOTAL) BY MOUTH DAILY AS NEEDED (SEASONAL ALLERGIES). Patient taking differently: Take 10 mg by mouth daily as needed for allergies (seasonal allergies).  11/12/19   [name], DO  diclofenac Sodium (VOLTAREN) 1 % GEL Apply 2 g topically 4 (four) times daily. Patient taking differently: Apply 2 g topically 4 (four) times daily as needed (pain).  03/03/20   Dagoberto Ligas, PA-C  glucose blood (ONETOUCH ULTRA) test strip Check FSBS twice a day. Dx: E11.21, N18.32 Patient taking differently: 1 each by Other route See admin instructions. Check FSBS twice a day. Dx: E11.21, N18.32 08/16/19   Fulp, Ander Gaster, MD  HYDROcodone-acetaminophen (NORCO/VICODIN) 5-325 MG tablet Take 1-2 tablets by mouth every 6 (six) hours  as needed (gout pain). Patient not taking: Reported on 03/09/2020 12/29/19   Loura Halt A, NP  ixazomib citrate (NINLARO) 3 MG capsule Take 1 capsule (3 mg) by mouth weekly, 3 weeks on, 1 week off, repeat every 4 weeks. Take on an empty stomach 1hr before or 2hr after meals. 03/10/20   Brunetta Genera, MD  metoprolol tartrate (LOPRESSOR) 25 MG tablet Take 0.5 tablets (12.5 mg total) by mouth 2 (two) times daily. Patient not taking: Reported on 03/09/2020 01/27/20 04/26/20  [place] Eric J, DO  multivitamin (RENA-VIT) TABS tablet Take 1 tablet by mouth at bedtime. Patient not taking: Reported on 03/09/2020 01/27/20 04/26/20  [place] Eric J, DO  oxyCODONE-acetaminophen (PERCOCET/ROXICET) 5-325 MG tablet Take 1 tablet by mouth every 6 (six) hours as needed for severe pain. 03/06/20   Lacretia Leigh, MD  tamsulosin (FLOMAX) 0.4 MG CAPS capsule Take 1 capsule (0.4 mg total) by mouth daily. 10/15/19   [name], DO    Allergies    Amoxicillin and Penicillins  Review of Systems   Review of Systems  Constitutional: Negative for fever.  HENT: Negative for rhinorrhea and sore throat.   Eyes: Negative for redness.  Respiratory: Positive for shortness of breath. Negative for cough.   Cardiovascular: Positive for palpitations. Negative for chest pain.  Gastrointestinal: Negative for abdominal pain, diarrhea, nausea and vomiting.  Genitourinary: Negative for flank pain.  Musculoskeletal: Positive for myalgias.  Skin: Negative for rash.  Neurological: Negative for headaches.    Physical Exam Updated Vital Signs BP (!) 157/105   Pulse (!) 122   Temp 98 F (36.7 C) (Oral)   Resp (!) 23   SpO2 94%   Physical Exam Vitals and nursing note reviewed.  Constitutional:      Appearance: He is well-developed.  HENT:     Head: Normocephalic and atraumatic.  Eyes:     General:        Right eye: No discharge.        Left eye: No discharge.     Conjunctiva/sclera: Conjunctivae normal.    Cardiovascular:     Rate and Rhythm: Regular rhythm. Tachycardia present.     Heart sounds: Normal heart sounds.     Comments: Patient with dialysis catheter, palpable, right chest  wall. Pulmonary:     Effort: Pulmonary effort is normal.     Breath sounds: Normal breath sounds.  Abdominal:     Palpations: Abdomen is soft.     Tenderness: There is no abdominal tenderness.  Musculoskeletal:     Cervical back: Normal range of motion and neck supple.  Skin:    General: Skin is warm and dry.  Neurological:     Mental Status: He is alert.     ED Results / Procedures / Treatments   Labs (all labs ordered are listed, but only abnormal results are displayed) Labs Reviewed  BASIC METABOLIC PANEL - Abnormal; Notable for the following components:      Result Value   Chloride 91 (*)    BUN 57 (*)    Creatinine, Ser 8.61 (*)    GFR calc non Af Amer 5 (*)    GFR calc Af Amer 6 (*)    All other components within normal limits  CBC - Abnormal; Notable for the following components:   RBC 2.73 (*)    Hemoglobin 8.1 (*)    HCT 25.8 (*)    RDW 17.2 (*)    nRBC 0.4 (*)    All other components within normal limits  TYPE AND SCREEN    EKG EKG Interpretation  Date/Time:  Tuesday March 21 2020 14:06:57 EDT Ventricular Rate:  116 PR Interval:    QRS Duration: 103 QT Interval:  359 QTC Calculation: 499 R Axis:   -68 Text Interpretation: Sinus or ectopic atrial tachycardia Incomplete RBBB and LAFB Anteroseptal infarct, age indeterminate No STEMI Confirmed by Nanda Quinton [phone]) on 03/21/2020 4:18:18 PM   Radiology NM PET Image Initial (PI) Whole Body  Result Date: 03/20/2020 CLINICAL DATA:  Initial treatment strategy for renal cell carcinoma with history of multiple myeloma previously evaluated with PET evaluation. EXAM: NUCLEAR MEDICINE PET WHOLE BODY TECHNIQUE: Is 10.2 mCi F-18 FDG was injected intravenously. Full-ring PET imaging was performed from the skull base to thigh after the  radiotracer. CT data was obtained and used for attenuation correction and anatomic localization. Fasting blood glucose: 122 mg/dl COMPARISON:  August 19, 2018 FINDINGS: Mediastinal blood pool activity: SUV max 2.64 HEAD/NECK: No hypermetabolic activity in the scalp. No hypermetabolic cervical lymph nodes. Incidental CT findings: none CHEST: No hypermetabolic mediastinal or hilar nodes. No suspicious pulmonary nodules on the CT scan. Incidental CT findings: 4.6 cm ascending thoracic aorta is unchanged. Calcified atheromatous plaque of the thoracic aorta. Cardiac enlargement similar to prior study. No pericardial effusion. Central pulmonary vasculature is of normal caliber. RIGHT-sided central venous access device, dual lumen catheter terminates at the caval to atrial junction. ABDOMEN/PELVIS: Large LEFT renal mass now shows peripheral greater than central uptake extending from the upper pole the LEFT kidney measuring approximately 9.1 x 8.0 cm, most recently in May of 2021 this measured approximately 8.6 x 7.5 cm. (SUVmax = 9.1) previously 12.1 No abdominal adenopathy. Liver with no focal, suspicious uptake. Spleen less than liver in terms of uptake and showing normal size. Incidental CT findings: Simple and complex cysts some with hemorrhagic or proteinaceous features in the bilateral kidneys none displaying FDG uptake aside from the large area arising from the upper pole the LEFT kidney. Complex area in the lower pole of the RIGHT kidney may represent a conglomerate of cysts, showing no uptake in measuring approximately 4.4 x 4.1 cm within 1-2 mm of previous measurements. Post bowel resection and ventral hernia containing the transverse colon as before. Marked  prostatomegaly similar to the prior study. 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 SKELETON: Uptake about the shoulders and hips bilaterally. Extensive spinal degenerative changes. Focal  uptake in the RIGHT anterior iliac bone (image 178, series 4) (SUVmax = 5.0 Increasing lucency in the iliac bone inferior to this area) Focal lucency in the LEFT shoulder (image 71, series 4) approximately 17 mm greatest dimension (SUVmax = 4.4) Subtle lucency also in the RIGHT proximal humerus not associated with focal FDG uptake but with some surrounding uptake and signs of bilateral glenohumeral joint effusions Expansion of musculature anterior to the RIGHT shoulder and the joint capsule (image 78, series 4) 3.5 x 2.7 cm (SUVmax = 5.0) Similar appearing area just anterior to the RIGHT sternoclavicular joint (image 74, series 4) 2.2 x 0.8 cm (SUVmax = 4.2) Subtle paraspinal involvement at the T1 level with this small focus of lytic change and adjacent to the costovertebral junction (SUVmax = 4.9) similar slight increased signal in facets of posterior elements in the upper thoracic spine likely T2 and T3 is of uncertain significance with a maximum SUV of 3.7. Diffuse bone demineralization Incidental CT findings: none EXTREMITIES: No additional involvement aside from the glenohumeral and hip findings discussed above. Incidental CT findings: IMPRESSION: 1. Enlarging LEFT renal mass with increased FDG uptake, slightly diminished uptake compared to the previous exam but with findings of presumed central necrosis. 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 2. Interval development of diffuse periarticular activity about the shoulders hips and to a lesser extent RIGHT sternoclavicular joint. Mile a involving the synovium of these joints is considered. Pattern could also be seen in the setting of septic arthritis. Small effusion of the RIGHT hip has enlarged with new effusion on the LEFT since previous imaging. Aspiration may be helpful for further evaluation. 3. New areas of bony uptake particularly in the LEFT proximal humerus and RIGHT iliac bone also suspicious for involvement  from myeloma. Renal cell with metastatic disease is also considered. Biopsy will be helpful to determine whether this is an alternative diagnosis. 4. T1 lesion and posterior element uptake suspicious for additional sites of disease. 5. Thoracic aortic aneurysm of similar caliber to previous exam. 6.  Aortic aneurysm NOS (ICD10-I71.9). 7.  Aortic Atherosclerosis (ICD10-I70.0). Electronically Signed   By: Zetta Bills M.D.   On: 03/20/2020 18:54   DG Chest Portable 1 View  Result Date: 03/21/2020 CLINICAL DATA:  74 year old male with palpitation. EXAM: PORTABLE CHEST 1 VIEW COMPARISON:  Chest radiograph dated 02/26/2020. FINDINGS: Right-sided dialysis catheter in similar position. There is cardiomegaly. No vascular congestion or edema. No focal consolidation, pleural effusion, or pneumothorax. No acute osseous pathology. IMPRESSION: 1. No acute cardiopulmonary process.  No interval change.  The 2. Cardiomegaly. Electronically Signed   By: Anner Crete M.D.   On: 03/21/2020 15:14    Procedures Procedures (including critical care time)  Medications Ordered in ED Medications  oxyCODONE-acetaminophen (PERCOCET/ROXICET) 5-325 MG per tablet 1 tablet (1 tablet Oral Given 03/21/20 1553)  metoprolol tartrate (LOPRESSOR) tablet 12.5 mg (12.5 mg Oral Given 03/21/20 1700)  metoprolol tartrate (LOPRESSOR) injection 5 mg (5 mg Intravenous Given 03/21/20 1701)  oxyCODONE-acetaminophen (PERCOCET/ROXICET) 5-325 MG per tablet 1 tablet (1 tablet Oral Given 03/21/20 1721)    ED Course  I have reviewed the triage vital signs and the nursing notes.  Pertinent labs & imaging results that were available during my care of the patient  were reviewed by me and considered in my medical decision making (see chart for details).  Patient seen and examined. Work-up initiated. Medications ordered.   Vital signs reviewed and are as follows: BP (!) 172/108   Pulse (!) 119   Resp 15   SpO2 95%   Hgb is 8.1.    4:24 PM  Patient discussed with and seen by Dr. Laverta Baltimore. Most concerning finding today is elevated heart rate which has not been present on previous visits.  Will give IV and home dose of metoprolol and reassess.  I was able to review PET scan from yesterday.  Will check temperature which has not yet been documented.  6:50 PM Patient continues to be tachycardic. V/Q scan ordered after discussion with Dr. Laverta Baltimore. Temp was 59F.   BP (!) 157/105   Pulse (!) 122   Temp 98 F (36.7 C) (Oral)   Resp (!) 23   SpO2 94%      MDM Rules/Calculators/A&P                          Pending completion of work-up.    Final Clinical Impression(s) / ED Diagnoses Final diagnoses:  None    Rx / DC Orders ED Discharge Orders    None       Carlisle Cater, PA-C 03/21/20 1851    Long, Wonda Olds, MD 03/21/20 2336

## 2020-03-21 NOTE — ED Triage Notes (Addendum)
Pt presents from home via EMS for palpitation, present intermitt for 1 mo, worse since last night. Received 2.5hr of 3hr dialysis tx d/t pain at site, scheduled to have new fistula created tomorrow and had an appt for palpitations but did not feel he should wait, hgb was low last time he felt this way.  SBP120, CBG105, 97.67F, 98RA, HR118bpm  Denies: n/v, pain, SOB, vision change, recent blood in stool

## 2020-03-22 ENCOUNTER — Encounter (HOSPITAL_COMMUNITY): Admission: RE | Payer: Self-pay | Source: Home / Self Care

## 2020-03-22 ENCOUNTER — Ambulatory Visit (HOSPITAL_COMMUNITY): Admission: RE | Admit: 2020-03-22 | Payer: Medicare Other | Source: Home / Self Care | Admitting: Vascular Surgery

## 2020-03-22 SURGERY — TRANSPOSITION, VEIN, BASILIC
Anesthesia: Monitor Anesthesia Care | Laterality: Left

## 2020-03-24 ENCOUNTER — Telehealth: Payer: Self-pay

## 2020-03-24 NOTE — Telephone Encounter (Signed)
Called [facility] to follow up on Palliative Care as well as to get an idea of what the process is. His Hem/Onc provider is also trying to get him in with Hospice. Was unsure if it would be quicker with [facility] since they've already done an at home evaluation.  Left voicemail to call back.

## 2020-03-27 ENCOUNTER — Other Ambulatory Visit: Payer: Self-pay

## 2020-03-27 ENCOUNTER — Other Ambulatory Visit: Payer: Medicare Other | Admitting: Hospice

## 2020-03-27 DIAGNOSIS — J441 Chronic obstructive pulmonary disease with (acute) exacerbation: Secondary | ICD-10-CM

## 2020-03-27 DIAGNOSIS — Z515 Encounter for palliative care: Secondary | ICD-10-CM

## 2020-03-27 NOTE — Progress Notes (Signed)
[facility] Palliative Care Consult Note Telephone: [phone]  Fax: [phone] PATIENT NAME: [name] [name] DOB: 07-Jun-1946 MRN: [id number]  PRIMARY CARE PROVIDER:   [name], DO  REFERRING PROVIDER: [name], DO  RESPONSIBLE PARTY:  [facility] [place] - son - [name] [name] [phone] LKTGYB : [phone]    RECOMMENDATIONS/PLAN:   [place] Planning/Goals of Care: Visit consisted of building trust and further discussions on Palliative Medicine as specialized medical care for people living with serious illness, aimed at facilitating better quality of life through symptoms relief, assisting with advance care plan and establishing goals of care. Extensive discussion today on goals of care clarification. Patient affirmed he is a FULL code. Goals of care include to maximize quality of life and symptom management.  Chart review from walker, CMA 03/24/20 indicates oncology provider is trying to get patient with hospice. NP discussed with patient that hospice is for patients with prognosis of 6 months or less who are no longer pursuing aggressive treatment such as chemotherapy.  He said he wanted to continue with his chemotherapy Ixazomib at this time.  Visit also consisted of counseling and education dealing with the complex and emotionally intense issues of symptom management and palliative care in the setting of serious and potentially life-threatening illness.  He said his [name] [name] faith is what he relies on to get through his health challenges.   Spoke with daughter [name] [name] during visit. Palliative care team will continue to support patient, patient's family, and medical team.  Follow up: Palliative care will continue to follow patient for goals of care clarification and symptom management.  Follow-up visit in a month. Symptom management:  Patient with ongoing multiple myeloma; followed by  oncology, currently on ixazomib. Patient said he went for CAT scan last week and is going back to cancer center on Wednesday for results/lab works/possible blood infusion. Patient seen in ED 03/06/2020 for pain in his right medial thigh; he endorsed it has improved and he is feeling a lot better.  He denies numbness or tingling to his right foot or ankle. He is compliant with his medications. He goes for  Dialysis on Tues, Thurs and Sat. No coughing, no shortness of breath, no chest pain.  No recent COPD exacerbation, currently not on oxygen or breathing treatments.  Patient said his main need is getting help with shower and help with activities of daily living.  NP reached out to PCP who responded that she had just put in home aide request for patient.  NP also spoke with [name] [name], giving her telephone number for mobile meals for patient.  [name] [name] said she will follow up on that and also on PCPs office to know when home aide is likely to commence.  Patient in no medical acuity. Encouraged ongoing care I spent 1 hour and 16 minutes providing this consultation; time iincludes time spent with patient/family, chart review, provider coordination,  and documentation. More than 50% of the time in this consultation was spent on coordinating communication  HISTORY OF PRESENT ILLNESS:  [name] [name] is a 74 y.o. year old male with multiple medical problems including ESRD on dialysis, COPD. Palliative Care was asked to help address goals of care.   CODE STATUS: Full  PPS: 60% HOSPICE ELIGIBILITY/DIAGNOSIS: TBD  PAST MEDICAL HISTORY:  Past Medical History:  Diagnosis Date  . Abnormal CT of liver    a. nodular contour suggesting cirrhosis 05/2018.  [name]  Abnormal LFTs (liver function tests)   . Anxiety   . Aortic root dilatation ([place])   . Ascending aortic aneurysm ([place])   . BPH with urinary obstruction   . Bradycardia   . 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 CAD (coronary artery disease)    a. 12/2015 NSTEMI/Cath (in setting of PAF):  LM nl, LAD 30p,  LCX 79m RCA  ok, AM 100%, RPDA1 40, RPDA 2 60 ->Med Rx.  . Childhood asthma   . Chronic diastolic CHF (congestive heart failure) ([place]    a. 12/2015 Echo: EF 50-55%, mod AI, mod Ao root dil, mild MR, mod dil LA, mod RA.  . CKD (chronic kidney disease), stage III   . Colon cancer ([place]   . COPD (chronic obstructive pulmonary disease) ([place]    pt denies at preop  . Diabetes mellitus type II    diet controlled  . History of syncope   . Hyperlipidemia   . Hypertensive heart disease   . Kidney lump 04/04/2010   Overview:  Renal Cell Carcinoma   . Light chain myeloma ([facility]   . Moderate aortic insufficiency    a. 12/2015 Echo: Mod AI.  .Marland KitchenParoxysmal atrial fibrillation ([facility]    a. 12/2015 started on Xarelto (CHA2DS2VASc = 4-5).  . Pneumonia   . Prostate cancer ([facility]   . Renal cell carcinoma ([facility] [place]   . Sleep apnea    Does not like  CPAP  . Spinal stenosis in cervical region 10/12/2016  . Venous insufficiency     SOCIAL HX:  Social History   Tobacco Use  . Smoking status: Former Smoker    Types: Cigars    Quit date: 09/02/1977    Years since quitting: 42.5  . Smokeless tobacco: Never Used  . Tobacco comment:    Substance Use Topics  . Alcohol use: Not Currently    Comment: "stopped drinking alcohol in ~ 1980; just drank a little on the weekends when I did drink"    ALLERGIES:  Allergies  Allergen Reactions  . Amoxicillin Other (See Comments)    Tolerates Unasyn. Can't move Has patient had a PCN reaction causing immediate rash, facial/tongue/throat swelling, SOB or lightheadedness with hypotension: No Has patient had a PCN reaction causing severe rash involving mucus membranes or skin necrosis: No Has patient had a PCN reaction that required hospitalization No Has patient had a PCN reaction occurring within the last 10 years: No If all of the above answers  are "NO", then may proceed with Cephalosporin use.   .Marland KitchenPenicillins Other (See Comments)    Tolerates Unasyn. Can't move/dizziness Has patient had a PCN reaction causing immediate rash, facial/tongue/throat swelling, SOB or lightheadedness with hypotension: No Has patient had a PCN reaction causing severe rash involving mucus membranes or skin necrosis: No Has patient had a PCN reaction that required hospitalization No Has patient had a PCN reaction occurring within the last 10 years: No If all of the above answers are "NO", then may proceed with Cephalosporin use.       PERTINENT MEDICATIONS:  Outpatient Encounter Medications as of 03/27/2020  Medication Sig  . amLODipine (NORVASC) 10 MG tablet Take 1 tablet (10 mg total) by mouth daily.  .Marland Kitchenatorvastatin (LIPITOR) 40 MG tablet Take 40 mg by mouth daily as needed (pt states he only takes when his cholesterol is high).   . cetirizine (ZYRTEC) 10 MG tablet TAKE  1 TABLET (10 MG TOTAL) BY MOUTH DAILY AS NEEDED (SEASONAL ALLERGIES). (Patient taking differently: Take 10 mg by mouth daily as needed for allergies (seasonal allergies). )  . diclofenac Sodium (VOLTAREN) 1 % GEL Apply 2 g topically 4 (four) times daily. (Patient taking differently: Apply 2 g topically 4 (four) times daily as needed (pain). )  . glucose blood (ONETOUCH ULTRA) test strip Check FSBS twice a day. Dx: E11.21, N18.32 (Patient taking differently: 1 each by Other route See admin instructions. Check FSBS twice a day. Dx: E11.21, N18.32)  . HYDROcodone-acetaminophen (NORCO/VICODIN) 5-325 MG tablet Take 1-2 tablets by mouth every 6 (six) hours as needed (gout pain). (Patient not taking: Reported on 03/09/2020)  . ixazomib citrate (NINLARO) 3 MG capsule Take 1 capsule (3 mg) by mouth weekly, 3 weeks on, 1 week off, repeat every 4 weeks. Take on an empty stomach 1hr before or 2hr after meals.  . metoprolol tartrate (LOPRESSOR) 25 MG tablet Take 0.5 tablets (12.5 mg total) by mouth 2 (two)  times daily. (Patient not taking: Reported on 03/09/2020)  . multivitamin (RENA-VIT) TABS tablet Take 1 tablet by mouth at bedtime. (Patient not taking: Reported on 03/09/2020)  . oxyCODONE-acetaminophen (PERCOCET/ROXICET) 5-325 MG tablet Take 1 tablet by mouth every 6 (six) hours as needed for severe pain.  . tamsulosin (FLOMAX) 0.4 MG CAPS capsule Take 1 capsule (0.4 mg total) by mouth daily.   No facility-administered encounter medications on file as of 03/27/2020.    PHYSICAL EXAM/ROS: General: NAD, cooperative Cardiovascular: regular rate and rhythm; denies chest pain Pulmonary: clear ant fields; no adventitious lung sounds on auscultation Abdomen: soft, nontender, + bowel sounds GU: no suprapubic tenderness Extremities: non pitting edema to BLE; dialysis tomorrow.  Skin: no rashes to exposed skin Neurological: Weakness but otherwise nonfocal  Teodoro Spray, NP

## 2020-03-28 ENCOUNTER — Telehealth: Payer: Self-pay

## 2020-03-28 ENCOUNTER — Telehealth (INDEPENDENT_AMBULATORY_CARE_PROVIDER_SITE_OTHER): Payer: Medicare Other | Admitting: Internal Medicine

## 2020-03-28 DIAGNOSIS — C9000 Multiple myeloma not having achieved remission: Secondary | ICD-10-CM

## 2020-03-28 DIAGNOSIS — D638 Anemia in other chronic diseases classified elsewhere: Secondary | ICD-10-CM

## 2020-03-28 DIAGNOSIS — N186 End stage renal disease: Secondary | ICD-10-CM | POA: Diagnosis not present

## 2020-03-28 NOTE — Telephone Encounter (Signed)
Called [phone]) to follow up on status of [id number] form that was submitted last week. Was informed that since form indicated that "patient is not medically stable" he is not eligible for their home health aide program. He would need a higher level of care than they are able to provide.  Will also route message to [place].  Opal Sidles are there any other resources available? Patient's Hematology/Oncology provider placed a referral to [facility] at his visit on 03/08/2020 since patient was agreeable but patient is not interested in Hospice anymore. I reached out to [facility] & they had a follow up visit with him on 03/27/2020 but it seems like they're only following up on a monthly basis. During his phone visit today he seemed very confused, which isn't like him. So him being at home alone is concerning.

## 2020-03-28 NOTE — Progress Notes (Signed)
Virtual Visit via Telephone Note  I connected with [name] [name], on 03/28/2020 at 11:01 AM by telephone due to the COVID-19 pandemic and verified that I am speaking with the correct person using two identifiers.   Consent: I discussed the limitations, risks, security and privacy concerns of performing an evaluation and management service by telephone and the availability of in person appointments. I also discussed with the patient that there may be a patient responsible charge related to this service. The patient expressed understanding and agreed to proceed.   Location of Patient: Home   Location of Provider: Clinic    Persons participating in Telemedicine visit: Kelton A Suniga Heide Guile Dr. Juleen China    History of Present Illness: Patient has a visit to follow up on ED visits and chronic medical conditions. Unfortunately, our visit today was limited. Mr. Passe seemed to have some confusion about the fact that he was speaking on the phone to me. Initially he seemed engaged and said he needed to turn his volume up on the phone. After that, it was difficult to determine if he was talking directly to me or someone in the background. Tried repeatedly calling his name and asking specific questions but was unable to elicit response. Myself and front office staff tried to call back after this but were unable to reach him.    Past Medical History:  Diagnosis Date  . Abnormal CT of liver    a. nodular contour suggesting cirrhosis 05/2018.  [name] Abnormal LFTs (liver function tests)   . Anxiety   . Aortic root dilatation ([place])   . Ascending aortic aneurysm ([place])   . BPH with urinary obstruction   . Bradycardia   . 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 CAD (coronary artery disease)    a. 12/2015 NSTEMI/Cath (in setting of PAF):  LM nl, LAD 30p,  LCX 71m RCA  ok, AM 100%, RPDA1 40, RPDA 2 60 ->Med Rx.  .  Childhood asthma   . Chronic diastolic CHF (congestive heart failure) ([facility]    a. 12/2015 Echo: EF 50-55%, mod AI, mod Ao root dil, mild MR, mod dil LA, mod RA.  . CKD (chronic kidney disease), stage III   . Colon cancer ([place]   . COPD (chronic obstructive pulmonary disease) ([facility]    pt denies at preop  . Diabetes mellitus type II    diet controlled  . History of syncope   . Hyperlipidemia   . Hypertensive heart disease   . Kidney lump 04/04/2010   Overview:  Renal Cell Carcinoma   . Light chain myeloma ([place]   . Moderate aortic insufficiency    a. 12/2015 Echo: Mod AI.  .Marland KitchenParoxysmal atrial fibrillation ([place]    a. 12/2015 started on Xarelto (CHA2DS2VASc = 4-5).  . Pneumonia   . Prostate cancer ([facility]   . Renal cell carcinoma ([facility]   . Sleep apnea    Does not like  CPAP  . Spinal stenosis in cervical region 10/12/2016  . Venous insufficiency    Allergies  Allergen Reactions  . Amoxicillin Other (See Comments)    Tolerates Unasyn. Can't move Has patient had a PCN reaction causing immediate rash, facial/tongue/throat swelling, SOB or lightheadedness with hypotension: No Has patient had a PCN reaction causing severe rash involving mucus membranes or skin necrosis: No Has patient had a PCN reaction that required hospitalization No Has patient had a PCN  reaction occurring within the last 10 years: No If all of the above answers are "NO", then may proceed with Cephalosporin use.   [name] Penicillins Other (See Comments)    Tolerates Unasyn. Can't move/dizziness Has patient had a PCN reaction causing immediate rash, facial/tongue/throat swelling, SOB or lightheadedness with hypotension: No Has patient had a PCN reaction causing severe rash involving mucus membranes or skin necrosis: No Has patient had a PCN reaction that required hospitalization No Has patient had a PCN reaction occurring within the last 10 years: No If all of the above answers are "NO", then may proceed with Cephalosporin  use.      Current Outpatient Medications on File Prior to Visit  Medication Sig Dispense Refill  . amLODipine (NORVASC) 10 MG tablet Take 1 tablet (10 mg total) by mouth daily. 90 tablet 1  . atorvastatin (LIPITOR) 40 MG tablet Take 40 mg by mouth daily as needed (pt states he only takes when his cholesterol is high).     . cetirizine (ZYRTEC) 10 MG tablet TAKE 1 TABLET (10 MG TOTAL) BY MOUTH DAILY AS NEEDED (SEASONAL ALLERGIES). (Patient taking differently: Take 10 mg by mouth daily as needed for allergies (seasonal allergies). ) 90 tablet 3  . diclofenac Sodium (VOLTAREN) 1 % GEL Apply 2 g topically 4 (four) times daily. (Patient taking differently: Apply 2 g topically 4 (four) times daily as needed (pain). ) 100 g 0  . glucose blood (ONETOUCH ULTRA) test strip Check FSBS twice a day. Dx: E11.21, N18.32 (Patient taking differently: 1 each by Other route See admin instructions. Check FSBS twice a day. Dx: E11.21, N18.32) 100 each 3  . HYDROcodone-acetaminophen (NORCO/VICODIN) 5-325 MG tablet Take 1-2 tablets by mouth every 6 (six) hours as needed (gout pain). (Patient not taking: Reported on 03/09/2020) 12 tablet 0  . ixazomib citrate (NINLARO) 3 MG capsule Take 1 capsule (3 mg) by mouth weekly, 3 weeks on, 1 week off, repeat every 4 weeks. Take on an empty stomach 1hr before or 2hr after meals. 3 capsule 1  . oxyCODONE-acetaminophen (PERCOCET/ROXICET) 5-325 MG tablet Take 1 tablet by mouth every 6 (six) hours as needed for severe pain. 12 tablet 0  . tamsulosin (FLOMAX) 0.4 MG CAPS capsule Take 1 capsule (0.4 mg total) by mouth daily. 90 capsule 0   No current facility-administered medications on file prior to visit.    Observations/Objective: NAD. Speaking clearly.  Work of breathing normal.  Alert and oriented. Mood appropriate.   Assessment and Plan: 1. Anemia of chronic disease Received transfusion in the ED last month. Most recent HgB was 8.1 on 03/21/20.   2. Multiple myeloma not  having achieved remission [facility]) Patient followed by oncology. He is currently undergoing chemotherapy. Onc has recommended palliative care. Palliative care consult was performed yesterday. At that time, patient indicated he planned to pursue further chemotherapy treatment. Currently this provider is working to establish home health care for patient.   3. ESRD (end stage renal disease) ([place]) Currently receiving HD.    Follow Up Instructions: 1 month f/u for chronic medical conditions    I discussed the assessment and treatment plan with the patient. The patient was provided an opportunity to ask questions and all were answered. The patient agreed with the plan and demonstrated an understanding of the instructions.   The patient was advised to call back or seek an in-person evaluation if the symptoms worsen or if the condition fails to improve as anticipated.     I  provided 20 minutes total of non-face-to-face time during this encounter including median intraservice time, reviewing previous notes, investigations, ordering medications, medical decision making, coordinating care and patient verbalized understanding at the end of the visit.    Phill Myron, D.O. Primary Care at [facility]  03/28/2020, 11:01 AM

## 2020-03-29 ENCOUNTER — Other Ambulatory Visit: Payer: Self-pay | Admitting: *Deleted

## 2020-03-29 ENCOUNTER — Inpatient Hospital Stay: Payer: Medicare Other

## 2020-03-29 ENCOUNTER — Other Ambulatory Visit: Payer: Self-pay

## 2020-03-29 ENCOUNTER — Other Ambulatory Visit: Payer: Self-pay | Admitting: Hematology

## 2020-03-29 ENCOUNTER — Inpatient Hospital Stay (HOSPITAL_BASED_OUTPATIENT_CLINIC_OR_DEPARTMENT_OTHER): Payer: Medicare Other | Admitting: Hematology

## 2020-03-29 VITALS — BP 148/87 | HR 78 | Temp 97.5°F | Resp 18 | Ht 69.0 in | Wt 205.0 lb

## 2020-03-29 DIAGNOSIS — C649 Malignant neoplasm of unspecified kidney, except renal pelvis: Secondary | ICD-10-CM

## 2020-03-29 DIAGNOSIS — C9000 Multiple myeloma not having achieved remission: Secondary | ICD-10-CM

## 2020-03-29 DIAGNOSIS — C9002 Multiple myeloma in relapse: Secondary | ICD-10-CM | POA: Diagnosis not present

## 2020-03-29 LAB — CBC WITH DIFFERENTIAL/PLATELET
Abs Immature Granulocytes: 0.04 10*3/uL (ref 0.00–0.07)
Basophils Absolute: 0 10*3/uL (ref 0.0–0.1)
Basophils Relative: 0 %
Eosinophils Absolute: 0 10*3/uL (ref 0.0–0.5)
Eosinophils Relative: 1 %
HCT: 21.7 % — ABNORMAL LOW (ref 39.0–52.0)
Hemoglobin: 7.1 g/dL — ABNORMAL LOW (ref 13.0–17.0)
Immature Granulocytes: 1 %
Lymphocytes Relative: 10 %
Lymphs Abs: 0.6 10*3/uL — ABNORMAL LOW (ref 0.7–4.0)
MCH: 30.5 pg (ref 26.0–34.0)
MCHC: 32.7 g/dL (ref 30.0–36.0)
MCV: 93.1 fL (ref 80.0–100.0)
Monocytes Absolute: 0.8 10*3/uL (ref 0.1–1.0)
Monocytes Relative: 13 %
Neutro Abs: 4.8 10*3/uL (ref 1.7–7.7)
Neutrophils Relative %: 75 %
Platelets: 225 10*3/uL (ref 150–400)
RBC: 2.33 MIL/uL — ABNORMAL LOW (ref 4.22–5.81)
RDW: 17.4 % — ABNORMAL HIGH (ref 11.5–15.5)
WBC: 6.3 10*3/uL (ref 4.0–10.5)
nRBC: 0 % (ref 0.0–0.2)

## 2020-03-29 LAB — CMP (CANCER CENTER ONLY)
ALT: 7 U/L (ref 0–44)
AST: 16 U/L (ref 15–41)
Albumin: 2.9 g/dL — ABNORMAL LOW (ref 3.5–5.0)
Alkaline Phosphatase: 63 U/L (ref 38–126)
Anion gap: 16 — ABNORMAL HIGH (ref 5–15)
BUN: 66 mg/dL — ABNORMAL HIGH (ref 8–23)
CO2: 27 mmol/L (ref 22–32)
Calcium: 10.3 mg/dL (ref 8.9–10.3)
Chloride: 89 mmol/L — ABNORMAL LOW (ref 98–111)
Creatinine: 12.71 mg/dL (ref 0.61–1.24)
GFR, Est AFR Am: 4 mL/min — ABNORMAL LOW (ref >60)
GFR, Estimated: 3 mL/min — ABNORMAL LOW (ref >60)
Glucose, Bld: 123 mg/dL — ABNORMAL HIGH (ref 70–99)
Potassium: 4.7 mmol/L (ref 3.5–5.1)
Sodium: 132 mmol/L — ABNORMAL LOW (ref 135–145)
Total Bilirubin: 0.4 mg/dL (ref 0.3–1.2)
Total Protein: 6.5 g/dL (ref 6.5–8.1)

## 2020-03-29 MED ORDER — OXYCODONE-ACETAMINOPHEN 5-325 MG PO TABS: 1.0000 | ORAL_TABLET | Freq: Four times a day (QID) | ORAL | 0 refills | Status: DC | PRN

## 2020-03-29 NOTE — Progress Notes (Signed)
HEMATOLOGY/ONCOLOGY CONSULTATION NOTE  Date of Service: 03/29/20  Patient Care Team: [name], DO as PCP - General (Family Medicine) Minus Breeding, MD as PCP - Cardiology (Cardiology) Center, [facility]  CHIEF COMPLAINTS/PURPOSE OF CONSULTATION:  Follow up for his Renal Cell carcinoma & Myeloma  HISTORY OF PRESENTING ILLNESS:  [name] [name] is a wonderful 74 y.o. male who has 74 year old man PMH renal cell carcinoma, prostate cancer, multiple myeloma, COPD, CKD stage III PRESENTED to [facility] emergency Department for hematuria beginning after self-catheterization (BPH). Found to have acute kidney injury.   Seen in the emergency department 10/1 for left great toe pain, diagnosed with gout, found to have worsening renal function, admission recommended but patient declined at that time.  Last evening the patient self catheterized and developed acute hematuria and so came to the emergency department for further evaluation where he was found to have gross hematuria.  He denies any bladder pain.  Review of systems is unremarkable.  No COVID symptoms or known exposure.  No specific aggravating or alleviating factors.  He reports that he has follow-up with his urologist on the 16th.  He is aware of his diagnosis of myeloma and suspected kidney cancer but did not seem concerned about this.  He has not been eating very well but drinks a lot of water.  INTERVAL HISTORY:   [name] [name] is a 74 year old man who is here for a f/u of his renal cell carcinoma, and multiple myeloma. We are joined today by his daughter & son. The patient's last visit with [place] was on 03/03/2020. The pt reports that he is doing well overall.  The pt reports that pt is experiencing nausea, but feels that it is caused by dialysis and taking his pain medication without meals. He currently goes to dialysis three days per week but has missed an appointment and does not continue his treatment for  the allotted time. He is currently receiving dialysis via a catheter in his chest. He is planning on visiting the Vascular Surgeon do discuss his PAD and to have his AV Fistula evaluated. Pt has not followed up with Urology yet. He has taken his weekly Ninlaro twice and denies any issues with the medication. Pt notes that he is not eating as well as he would like.   Of note since the patient's last visit, pt has had PET/CT ([id number]) completed on 03/20/2020 with results revealing "1. Enlarging LEFT renal mass with increased FDG uptake, slightly diminished uptake compared to the previous exam but with findings of presumed central necrosis. 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 2. Interval development of diffuse periarticular activity about the shoulders hips and to a lesser extent RIGHT sternoclavicular joint. Mile a involving the synovium of these joints is considered. Pattern could also be seen in the setting of septic arthritis. Small effusion of the RIGHT hip has enlarged with new effusion on the LEFT since previous imaging. Aspiration may be helpful for further evaluation. 3. New areas of bony uptake particularly in the LEFT proximal humerus and RIGHT iliac bone also suspicious for involvement from myeloma. Renal cell with metastatic disease is also considered. Biopsy will be helpful to determine whether this is an alternative diagnosis. 4. T1 lesion and posterior element uptake suspicious for additional sites of disease. 5. Thoracic aortic aneurysm of similar caliber to previous exam. 6.  Aortic aneurysm NOS (ICD10-I71.9).7.  Aortic Atherosclerosis (ICD10-I70.0)."  Lab results today (03/29/20) of CBC  w/diff and CMP is as follows: all values are WNL except for RBC at 2.33, Hgb at 7.1, HCT at 21.7, RDW at 17.4, Lymphs Abs at 0.6K, Sodium at 132, Chloride at 89, Glucose at 123, BUN at 66, Creatinine at 12.71, Albumin at 2.9, GFR Est Af Am at 4, Anion gap at  16. 03/29/2020 MMP is in progress 03/29/2020 K/L light chains are in progress  On review of systems, pt reports nausea, low appetite, fatigue and denies abdominal pain and any other symptoms.   MEDICAL HISTORY:  Past Medical History:  Diagnosis Date  . Abnormal CT of liver    a. nodular contour suggesting cirrhosis 05/2018.  [name] Abnormal LFTs (liver function tests)   . Anxiety   . Aortic root dilatation ([place])   . Ascending aortic aneurysm ([place])   . BPH with urinary obstruction   . Bradycardia   . C5-C7 level with spinal cord injury with central cord syndrome, without evidence of spinal bone injury ([place]) 10/12/2016  . CAD (coronary artery disease)    a. 12/2015 NSTEMI/Cath (in setting of PAF):  LM nl, LAD 30p,  LCX 31m RCA  ok, AM 100%, RPDA1 40, RPDA 2 60 ->Med Rx.  . Childhood asthma   . Chronic diastolic CHF (congestive heart failure) ([place]    a. 12/2015 Echo: EF 50-55%, mod AI, mod Ao root dil, mild MR, mod dil LA, mod RA.  . CKD (chronic kidney disease), stage III   . Colon cancer ([place]   . COPD (chronic obstructive pulmonary disease) ([place]    pt denies at preop  . Diabetes mellitus type II    diet controlled  . History of syncope   . Hyperlipidemia   . Hypertensive heart disease   . Kidney lump 04/04/2010   Overview:  Renal Cell Carcinoma   . Light chain myeloma ([place]   . Moderate aortic insufficiency    a. 12/2015 Echo: Mod AI.  .Marland KitchenParoxysmal atrial fibrillation ([place]    a. 12/2015 started on Xarelto (CHA2DS2VASc = 4-5).  . Pneumonia   . Prostate cancer ([place]   . Renal cell carcinoma ([place]   . Sleep apnea    Does not like  CPAP  . Spinal stenosis in cervical region 10/12/2016  . Venous insufficiency     SURGICAL HISTORY: Past Surgical History:  Procedure Laterality Date  . AV FISTULA PLACEMENT Left 01/26/2020   Procedure: left ARTERIOVENOUS (AV) FISTULA CREATION;  Surgeon: CWaynetta Sandy MD;  [facility]  Service: Vascular;  Laterality: Left;  . BACK  SURGERY    . CARDIAC CATHETERIZATION N/A 12/18/2015   Procedure: Left Heart Cath and Coronary Angiography;  Surgeon: DLeonie Man MD;  [facility];  Service: Cardiovascular;  Laterality: N/A;  . DIAGNOSTIC LAPAROSCOPY     partial colectomy  . IR FLUORO GUIDE CV LINE RIGHT  06/28/2019  . IR FLUORO GUIDE CV LINE RIGHT  01/24/2020  . IR REMOVAL TUN CV CATH W/O FL  07/26/2019  . IR [place] ACCESS RIGHT  06/28/2019  . IR [place] VASC ACCESS RIGHT  01/24/2020  . LAPAROSCOPIC PARTIAL COLECTOMY Right 05/21/2018   Procedure: LAPAROSCOPIC RIGHT  COLECTOMY ERAS PATHWAY;  Surgeon: TLeighton Ruff MD;  Location[facility];  Service: General;  Laterality: Right;  . [place]  "replaced a disc"    SOCIAL HISTORY: Social History   Socioeconomic History  . Marital status: Widowed    Spouse name: Not  on file  . Number of children: Not on file  . Years of education: Not on file  . Highest education level: Not on file  Occupational History  . Not on file  Tobacco Use  . Smoking status: Former Smoker    Types: Cigars    Quit date: 09/02/1977    Years since quitting: 42.6  . Smokeless tobacco: Never Used  . Tobacco comment:    Vaping Use  . Vaping Use: Never used  Substance and Sexual Activity  . Alcohol use: Not Currently    Comment: "stopped drinking alcohol in ~ 1980; just drank a little on the weekends when I did drink"  . Drug use: No  . Sexual activity: Not Currently  Other Topics Concern  . Not on file  Social History Narrative   Lives alone.  Wife died in 04-09-2023.  Lives in apartment.     Social Determinants of Health   Financial Resource Strain:   . Difficulty of Paying Living Expenses:   Food Insecurity:   . Worried About Charity fundraiser in the Last Year:   . Arboriculturist in the Last Year:   Transportation Needs:   . Film/video editor (Medical):   [name] Lack of Transportation (Non-Medical):   Physical Activity:   . Days of Exercise  per Week:   . Minutes of Exercise per Session:   Stress:   . Feeling of Stress :   Social Connections:   . Frequency of Communication with Friends and Family:   . Frequency of Social Gatherings with Friends and Family:   . Attends Religious Services:   . Active Member of Clubs or Organizations:   . Attends Archivist Meetings:   [name] Marital Status:   Intimate Partner Violence:   . Fear of Current or Ex-Partner:   . Emotionally Abused:   [name] Physically Abused:   . Sexually Abused:     FAMILY HISTORY: Family History  Problem Relation Age of Onset  . Emphysema Father   . Asthma Father   . Liver disease Father        tumor  . Heart disease Mother   . Hypertension Mother   . Asthma Sister   . Hypertension Son     ALLERGIES:  is allergic to amoxicillin and penicillins.  MEDICATIONS:  Current Outpatient Medications  Medication Sig Dispense Refill  . amLODipine (NORVASC) 10 MG tablet Take 1 tablet (10 mg total) by mouth daily. 90 tablet 1  . atorvastatin (LIPITOR) 40 MG tablet Take 40 mg by mouth daily as needed (pt states he only takes when his cholesterol is high).     . cetirizine (ZYRTEC) 10 MG tablet TAKE 1 TABLET (10 MG TOTAL) BY MOUTH DAILY AS NEEDED (SEASONAL ALLERGIES). (Patient taking differently: Take 10 mg by mouth daily as needed for allergies (seasonal allergies). ) 90 tablet 3  . diclofenac Sodium (VOLTAREN) 1 % GEL Apply 2 g topically 4 (four) times daily. (Patient taking differently: Apply 2 g topically 4 (four) times daily as needed (pain). ) 100 g 0  . glucose blood (ONETOUCH ULTRA) test strip Check FSBS twice a day. Dx: E11.21, N18.32 (Patient taking differently: 1 each by Other route See admin instructions. Check FSBS twice a day. Dx: E11.21, N18.32) 100 each 3  . HYDROcodone-acetaminophen (NORCO/VICODIN) 5-325 MG tablet Take 1-2 tablets by mouth every 6 (six) hours as needed (gout pain). (Patient not taking: Reported on 03/09/2020) 12 tablet 0  . ixazomib  citrate (NINLARO) 3 MG capsule Take 1 capsule (3 mg) by mouth weekly, 3 weeks on, 1 week off, repeat every 4 weeks. Take on an empty stomach 1hr before or 2hr after meals. 3 capsule 1  . oxyCODONE-acetaminophen (PERCOCET/ROXICET) 5-325 MG tablet Take 1 tablet by mouth every 6 (six) hours as needed for moderate pain or severe pain. 30 tablet 0  . tamsulosin (FLOMAX) 0.4 MG CAPS capsule Take 1 capsule (0.4 mg total) by mouth daily. 90 capsule 0   No current facility-administered medications for this visit.    REVIEW OF SYSTEMS:   A 10+ POINT REVIEW OF SYSTEMS WAS OBTAINED including neurology, dermatology, psychiatry, cardiac, respiratory, lymph, extremities, GI, GU, Musculoskeletal, constitutional, breasts, reproductive, HEENT.  All pertinent positives are noted in the HPI.  All others are negative.   PHYSICAL EXAMINATION: ECOG FS:2 - Symptomatic, <50% confined to bed  Vitals:   03/29/20 1434  BP: (!) 148/87  Pulse: 78  Resp: 18  Temp: (!) 97.5 F (36.4 C)  SpO2: 97%   Wt Readings from Last 3 Encounters:  03/29/20 (!) 205 lb (93 kg)  03/14/20 196 lb (88.9 kg)  03/03/20 196 lb (88.9 kg)   Body mass index is 30.27 kg/m.    Exam was given in a wheelchair   GENERAL:alert, in no acute distress and comfortable SKIN: no acute rashes, no significant lesions EYES: conjunctiva are pink and non-injected, sclera anicteric OROPHARYNX: MMM, no exudates, no oropharyngeal erythema or ulceration NECK: supple, no JVD LYMPH:  no palpable lymphadenopathy in the cervical, axillary or inguinal regions LUNGS: clear to auscultation b/l with normal respiratory effort HEART: regular rate & rhythm ABDOMEN:  normoactive bowel sounds , non tender, not distended. No palpable hepatosplenomegaly.  Extremity: 2+ pedal edema b/l PSYCH: alert & oriented x 3 with fluent speech NEURO: no focal motor/sensory deficits  LABORATORY DATA:  I have reviewed the data as listed  . CBC Latest Ref Rng & Units  03/29/2020 03/21/2020 03/14/2020  WBC 4.0 - 10.5 K/uL 6.3 7.1 6.0  Hemoglobin 13.0 - 17.0 g/dL 7.1(L) 8.1(L) 7.7(L)  Hematocrit 39 - 52 % 21.7(L) 25.8(L) 23.6(L)  Platelets 150 - 400 K/uL 225 277 237    . CMP Latest Ref Rng & Units 03/29/2020 03/21/2020 03/14/2020  Glucose 70 - 99 mg/dL 123(H) 93 131(H)  BUN 8 - 23 mg/dL 66(H) 57(H) 34(H)  Creatinine 0.61 - 1.24 mg/dL 12.71(HH) 8.61(H) 7.02(H)  Sodium 135 - 145 mmol/L 132(L) 135 135  Potassium 3.5 - 5.1 mmol/L 4.7 4.0 3.6  Chloride 98 - 111 mmol/L 89(L) 91(L) 94(L)  CO2 22 - 32 mmol/L 27 29 29   Calcium 8.9 - 10.3 mg/dL 10.3 9.8 8.6(L)  Total Protein 6.5 - 8.1 g/dL 6.5 - 6.4(L)  Total Bilirubin 0.3 - 1.2 mg/dL 0.4 - 0.5  Alkaline Phos 38 - 126 U/L 63 - 57  AST 15 - 41 U/L 16 - 21  ALT 0 - 44 U/L 7 - 11   RADIOGRAPHIC STUDIES: I have personally reviewed the radiological images as listed and agreed with the findings in the report. NM Pulmonary Perfusion  Result Date: 03/21/2020 CLINICAL DATA:  Chest pain EXAM: NUCLEAR MEDICINE PERFUSION LUNG SCAN TECHNIQUE: Perfusion images were obtained in multiple projections after intravenous injection of radiopharmaceutical. Ventilation scans intentionally deferred if perfusion scan and chest x-ray adequate for interpretation during COVID 19 epidemic. RADIOPHARMACEUTICALS:  3.8  mCi Tc-76mMAA IV COMPARISON:  None. FINDINGS: Normal radiotracer uptake seen throughout both lungs on single perfusion images.  IMPRESSION: No perfusion defects seen. Electronically Signed   By: Prudencio Pair M.D.   On: 03/21/2020 21:35   NM PET Image Initial (PI) Whole Body  Result Date: 03/20/2020 CLINICAL DATA:  Initial treatment strategy for renal cell carcinoma with history of multiple myeloma previously evaluated with PET evaluation. EXAM: NUCLEAR MEDICINE PET WHOLE BODY TECHNIQUE: Is 10.2 mCi F-18 FDG was injected intravenously. Full-ring PET imaging was performed from the skull base to thigh after the radiotracer. CT data was  obtained and used for attenuation correction and anatomic localization. Fasting blood glucose: 122 mg/dl COMPARISON:  August 19, 2018 FINDINGS: Mediastinal blood pool activity: SUV max 2.64 HEAD/NECK: No hypermetabolic activity in the scalp. No hypermetabolic cervical lymph nodes. Incidental CT findings: none CHEST: No hypermetabolic mediastinal or hilar nodes. No suspicious pulmonary nodules on the CT scan. Incidental CT findings: 4.6 cm ascending thoracic aorta is unchanged. Calcified atheromatous plaque of the thoracic aorta. Cardiac enlargement similar to prior study. No pericardial effusion. Central pulmonary vasculature is of normal caliber. RIGHT-sided central venous access device, dual lumen catheter terminates at the caval to atrial junction. ABDOMEN/PELVIS: Large LEFT renal mass now shows peripheral greater than central uptake extending from the upper pole the LEFT kidney measuring approximately 9.1 x 8.0 cm, most recently in May of 2021 this measured approximately 8.6 x 7.5 cm. (SUVmax = 9.1) previously 12.1 No abdominal adenopathy. Liver with no focal, suspicious uptake. Spleen less than liver in terms of uptake and showing normal size. Incidental CT findings: Simple and complex cysts some with hemorrhagic or proteinaceous features in the bilateral kidneys none displaying FDG uptake aside from the large area arising from the upper pole the LEFT kidney. Complex area in the lower pole of the RIGHT kidney may represent a conglomerate of cysts, showing no uptake in measuring approximately 4.4 x 4.1 cm within 1-2 mm of previous measurements. Post bowel resection and ventral hernia containing the transverse colon as before. Marked prostatomegaly similar to the prior study. 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 SKELETON: Uptake about the shoulders and hips bilaterally. Extensive spinal degenerative changes. Focal uptake in the RIGHT anterior  iliac bone (image 178, series 4) (SUVmax = 5.0 Increasing lucency in the iliac bone inferior to this area) Focal lucency in the LEFT shoulder (image 71, series 4) approximately 17 mm greatest dimension (SUVmax = 4.4) Subtle lucency also in the RIGHT proximal humerus not associated with focal FDG uptake but with some surrounding uptake and signs of bilateral glenohumeral joint effusions Expansion of musculature anterior to the RIGHT shoulder and the joint capsule (image 78, series 4) 3.5 x 2.7 cm (SUVmax = 5.0) Similar appearing area just anterior to the RIGHT sternoclavicular joint (image 74, series 4) 2.2 x 0.8 cm (SUVmax = 4.2) Subtle paraspinal involvement at the T1 level with this small focus of lytic change and adjacent to the costovertebral junction (SUVmax = 4.9) similar slight increased signal in facets of posterior elements in the upper thoracic spine likely T2 and T3 is of uncertain significance with a maximum SUV of 3.7. Diffuse bone demineralization Incidental CT findings: none EXTREMITIES: No additional involvement aside from the glenohumeral and hip findings discussed above. Incidental CT findings: IMPRESSION: 1. Enlarging LEFT renal mass with increased FDG uptake, slightly diminished uptake compared to the previous exam but with findings of presumed central necrosis. 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 2. Interval development of diffuse periarticular  activity about the shoulders hips and to a lesser extent RIGHT sternoclavicular joint. Mile a involving the synovium of these joints is considered. Pattern could also be seen in the setting of septic arthritis. Small effusion of the RIGHT hip has enlarged with new effusion on the LEFT since previous imaging. Aspiration may be helpful for further evaluation. 3. New areas of bony uptake particularly in the LEFT proximal humerus and RIGHT iliac bone also suspicious for involvement from myeloma. Renal cell with  metastatic disease is also considered. Biopsy will be helpful to determine whether this is an alternative diagnosis. 4. T1 lesion and posterior element uptake suspicious for additional sites of disease. 5. Thoracic aortic aneurysm of similar caliber to previous exam. 6.  Aortic aneurysm NOS (ICD10-I71.9). 7.  Aortic Atherosclerosis (ICD10-I70.0). Electronically Signed   By: Zetta Bills M.D.   On: 03/20/2020 18:54   DG Chest Portable 1 View  Result Date: 03/21/2020 CLINICAL DATA:  74 year old male with palpitation. EXAM: PORTABLE CHEST 1 VIEW COMPARISON:  Chest radiograph dated 02/26/2020. FINDINGS: Right-sided dialysis catheter in similar position. There is cardiomegaly. No vascular congestion or edema. No focal consolidation, pleural effusion, or pneumothorax. No acute osseous pathology. IMPRESSION: 1. No acute cardiopulmonary process.  No interval change.  The 2. Cardiomegaly. Electronically Signed   By: Anner Crete M.D.   On: 03/21/2020 15:14   VAS US DUPLEX DIALYSIS ACCESS (AVF, AVG)  Result Date: 03/03/2020 DIALYSIS ACCESS Reason for Exam: Routine follow up. Access Site: Left Upper Extremity. Access Type: Basilic vein transposition 01/26/2020 Performing Technologist: Ronal Fear RVS, RCS  Examination Guidelines: A complete evaluation includes B-mode imaging, spectral Doppler, color Doppler, and power Doppler as needed of all accessible portions of each vessel. Unilateral testing is considered an integral part of a complete examination. Limited examinations for reoccurring indications may be performed as noted.  Findings: +--------------------+----------+-----------------+--------+ AVF                 PSV (cm/s)Flow Vol (mL/min)Comments +--------------------+----------+-----------------+--------+ Native artery inflow   246          1572                +--------------------+----------+-----------------+--------+ AVF Anastomosis        604                               +--------------------+----------+-----------------+--------+  +------------+----------+-------------+----------+----------------+ OUTFLOW VEINPSV (cm/s)Diameter (cm)Depth (cm)    Describe     +------------+----------+-------------+----------+----------------+ Prox UA        277        0.78        1.22                    +------------+----------+-------------+----------+----------------+ Mid UA         375        0.67        1.10                    +------------+----------+-------------+----------+----------------+ Dist UA        644        0.78        1.15   competing branch +------------+----------+-------------+----------+----------------+   Summary: Patent arteriovenous fistula with one small branch observed. *See table(s) above for measurements and observations.  Diagnosing physician: Servando Snare MD Electronically signed by Servando Snare MD on 03/03/2020 at 3:47:22 PM.   --------------------------------------------------------------------------------   Final    VAS [place]  LOWER EXTREMITY VENOUS (DVT) (ONLY [facility] & [facility] 7a-7p)  Result Date: 03/06/2020  Lower Venous DVTStudy Indications: Edema.  Comparison Study: 12/10/19 previous Performing Technologist: Abram Sander RVS  Examination Guidelines: A complete evaluation includes B-mode imaging, spectral Doppler, color Doppler, and power Doppler as needed of all accessible portions of each vessel. Bilateral testing is considered an integral part of a complete examination. Limited examinations for reoccurring indications may be performed as noted. The reflux portion of the exam is performed with the patient in reverse Trendelenburg.  +---------+---------------+---------+-----------+----------+--------------+ RIGHT    CompressibilityPhasicitySpontaneityPropertiesThrombus Aging +---------+---------------+---------+-----------+----------+--------------+ CFV      Full           Yes      Yes                                  +---------+---------------+---------+-----------+----------+--------------+ SFJ      Full                                                        +---------+---------------+---------+-----------+----------+--------------+ FV Prox  Full                                                        +---------+---------------+---------+-----------+----------+--------------+ FV Mid   Full                                                        +---------+---------------+---------+-----------+----------+--------------+ FV DistalFull                                                        +---------+---------------+---------+-----------+----------+--------------+ PFV      Full                                                        +---------+---------------+---------+-----------+----------+--------------+ POP      Full           Yes      Yes                                 +---------+---------------+---------+-----------+----------+--------------+ PTV      Full                                                        +---------+---------------+---------+-----------+----------+--------------+ PERO     Full                                                        +---------+---------------+---------+-----------+----------+--------------+   +----+---------------+---------+-----------+----------+--------------+  LEFTCompressibilityPhasicitySpontaneityPropertiesThrombus Aging +----+---------------+---------+-----------+----------+--------------+ CFV Full           Yes      Yes                                 +----+---------------+---------+-----------+----------+--------------+     Summary: RIGHT: - There is no evidence of deep vein thrombosis in the lower extremity.  - No cystic structure found in the popliteal fossa.  LEFT: - No evidence of common femoral vein obstruction.  *See table(s) above for measurements and observations. Electronically signed by Monica Martinez  MD on 03/06/2020 at 11:00:45 AM.    Final     ASSESSMENT & PLAN:     1) Progressive Kappa light chain Multiple Myeloma -- with likely progression since patient abandoned treatment on 2 previous occasions. Now with ESRD on Hemodialysis and with significant anemia. 2) Worsening renal failure -- likely myeloma kidney- ESRD on HD 3) Anemia due to CKD, myeloma , RCC  4) RCC - progressive PLAN:  -Discussed pt labwork today, 03/29/20; WBC & PLT are nml, significant anemia, kidney numbers are very elevated - pt is being under dialyzed; MMP & K/L light chains are in progress -Advised pt that there can be some nausea caused by Ninlaro the day he takes his medication.  -Advised pt that we unsure how much kidney function recovery we can expect with his current treatment regimen for Multiple Myeloma. Dialysis will likely be an ongoing consideration.  -PET/CT results discussed. 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 May have to back off of current Ninlaro dosage if anemia worsens.  -Advised pt that Multiple Myeloma, Renal Cell Carcinoma, and dialysis are all factors in his anemia.  -Advised pt that Multiple Myeloma is not curative and can increase risk of bleeding during kidney urgery.  -Advised pt that transfusion can cause him to become fluid overloaded and may land him in the ED. -Advised pt that his Renal Cell Carcinoma needs to be evaluated and treated immediately, otherwise it will be life limiting.  -Advised pt that my recommendation is that he begin hospice care to remain comfortable. Pt would prefer to continue treatment at this time.  -The pt has no prohibitive toxicities from continuing 3 mg Ninlaro weekly - 3 weeks on, 1 week off at this time.  -Will give 1 unit of PRBC in 1-2 days. Recommend pt go to ED immediately if he becomes lightheaded or dizzy.  -Refill Percocet - pt knows this is a  one time prescription. Advised to connect with PCP for ongoing pain management.  -Recommend pt f/u with Dr. Louis Meckel ASAP -Will see back in 4 weeks with labs     FOLLOW UP: PRBC transfusion in 1-2 days RTC with Dr Irene Limbo with labs in 4 weeks   The total time spent in the appt was 40 minutes and more than 50% was on counseling and direct patient cares and goals of care discussion.  All of the patient's questions were answered with apparent satisfaction. The patient knows to call the clinic with any problems, questions or concerns.   Sullivan Lone MD [place] AAHIVMS [facility] Hematology/Oncology Physician [facility]  (Office):       [phone] (Work cell):  [phone] (Fax):           [phone]  03/29/2020 3:41 PM  I, Yevette Edwards, am acting as a Education administrator for Dr. Sullivan Lone.   .I have reviewed the above documentation for accuracy and completeness, and I agree with the above. Brunetta Genera MD

## 2020-03-30 LAB — KAPPA/LAMBDA LIGHT CHAINS
Kappa, lambda light chain ratio: 3344.65 — ABNORMAL HIGH (ref 0.26–1.65)
Lambda free light chains: 19.9 mg/L (ref 5.7–26.3)

## 2020-03-30 NOTE — Telephone Encounter (Signed)
Calls placed to patient # [phone]  to discuss options for supportive care at home/ALF.  Message left with call back requested to this CM.  Call placed to # [phone] and it just rings,no voicemail option.  Call placed to daughter, [name]. Discussed options of ALF, CAP, PACE.  She was not sure that her father would agree to ALF but she requested the information.  She explained that they had contacted [facility] about the [facility] referral and she was informed that the application was not filled out completely/correctly and he was denied.  [name] requested a walker for her father  as he has been having difficulty ambulating. Informed her that the PCP would be notified of the request.     Email sent to [facility] with listing of  ALFs in [facility], information about CAP ( informed her that there is about a 6 month wait ) and contact information about PACE program.   Email: [email or web address]

## 2020-03-30 NOTE — Telephone Encounter (Signed)
I think ALF would actually be a very good option for Mr. Prats. However, I am not sure he would be agreeable. Opal Sidles, would you be able to reach out to him or to his daughters to discuss the various options you were able to find?   Thanks,   Phill Myron, D.O. Primary Care at [facility]  03/30/2020, 10:23 AM

## 2020-03-31 ENCOUNTER — Other Ambulatory Visit: Payer: Self-pay

## 2020-03-31 ENCOUNTER — Inpatient Hospital Stay: Payer: Medicare Other

## 2020-03-31 ENCOUNTER — Other Ambulatory Visit: Payer: Self-pay | Admitting: Internal Medicine

## 2020-03-31 ENCOUNTER — Other Ambulatory Visit: Payer: Self-pay | Admitting: *Deleted

## 2020-03-31 VITALS — BP 167/86 | HR 100 | Temp 98.9°F | Resp 16

## 2020-03-31 DIAGNOSIS — C649 Malignant neoplasm of unspecified kidney, except renal pelvis: Secondary | ICD-10-CM

## 2020-03-31 DIAGNOSIS — C9002 Multiple myeloma in relapse: Secondary | ICD-10-CM | POA: Diagnosis not present

## 2020-03-31 DIAGNOSIS — D649 Anemia, unspecified: Secondary | ICD-10-CM

## 2020-03-31 DIAGNOSIS — R5381 Other malaise: Secondary | ICD-10-CM

## 2020-03-31 DIAGNOSIS — C9000 Multiple myeloma not having achieved remission: Secondary | ICD-10-CM

## 2020-03-31 LAB — PREPARE RBC (CROSSMATCH)

## 2020-03-31 LAB — MULTIPLE MYELOMA PANEL, SERUM
Albumin SerPl Elph-Mcnc: 2.8 g/dL — ABNORMAL LOW (ref 2.9–4.4)
Albumin/Glob SerPl: 1 (ref 0.7–1.7)
Alpha 1: 0.3 g/dL (ref 0.0–0.4)
Alpha2 Glob SerPl Elph-Mcnc: 0.9 g/dL (ref 0.4–1.0)
B-Globulin SerPl Elph-Mcnc: 0.7 g/dL (ref 0.7–1.3)
Gamma Glob SerPl Elph-Mcnc: 1.1 g/dL (ref 0.4–1.8)
Globulin, Total: 3.1 g/dL (ref 2.2–3.9)
IgA: 27 mg/dL — ABNORMAL LOW (ref 61–437)
IgG (Immunoglobin G), Serum: 625 mg/dL (ref 603–1613)
IgM (Immunoglobulin M), Srm: 8 mg/dL — ABNORMAL LOW (ref 15–143)
M Protein SerPl Elph-Mcnc: 0.6 g/dL — ABNORMAL HIGH
Total Protein ELP: 5.9 g/dL — ABNORMAL LOW (ref 6.0–8.5)

## 2020-03-31 MED ORDER — SODIUM CHLORIDE 0.9% IV SOLUTION
250.0000 mL | Freq: Once | INTRAVENOUS | Status: AC
  Administered 2020-03-31: 250 mL via INTRAVENOUS
  Filled 2020-03-31: qty 250

## 2020-03-31 MED ORDER — ACETAMINOPHEN 325 MG PO TABS
ORAL_TABLET | ORAL | Status: AC
  Filled 2020-03-31: qty 2

## 2020-03-31 MED ORDER — DIPHENHYDRAMINE HCL 25 MG PO CAPS
ORAL_CAPSULE | ORAL | Status: AC
  Filled 2020-03-31: qty 1

## 2020-03-31 MED ORDER — ACETAMINOPHEN 325 MG PO TABS
650.0000 mg | ORAL_TABLET | Freq: Once | ORAL | Status: AC
  Administered 2020-03-31: 650 mg via ORAL

## 2020-03-31 MED ORDER — DIPHENHYDRAMINE HCL 25 MG PO TABS
25.0000 mg | ORAL_TABLET | Freq: Once | ORAL | Status: AC
  Administered 2020-03-31: 25 mg via ORAL
  Filled 2020-03-31: qty 1

## 2020-03-31 NOTE — Progress Notes (Signed)
dme

## 2020-03-31 NOTE — Telephone Encounter (Signed)
Faxed DME order, demographics & insurance information to [place].

## 2020-03-31 NOTE — Telephone Encounter (Signed)
Thank you for following up on this and providing such thorough information to the family. I will place a DME order for a walker.   Phill Myron, D.O. Primary Care at [facility]  03/31/2020, 11:04 AM

## 2020-04-01 LAB — TYPE AND SCREEN
ABO/RH(D): A POS
Antibody Screen: NEGATIVE
Unit division: 0

## 2020-04-01 LAB — BPAM RBC
Blood Product Expiration Date: 202108112359
ISSUE DATE / TIME: 202107301335
Unit Type and Rh: 6200

## 2020-04-04 ENCOUNTER — Telehealth: Payer: Self-pay | Admitting: *Deleted

## 2020-04-04 NOTE — Telephone Encounter (Signed)
Faxed Request for Independent Assessment for [place] to [facility] @ [phone] - signed by Dr. Irene Limbo. Fax confirmation received. Sent to him to be scanned.

## 2020-04-07 ENCOUNTER — Other Ambulatory Visit: Payer: Self-pay | Admitting: Hematology

## 2020-04-10 ENCOUNTER — Other Ambulatory Visit: Payer: Medicare Other | Admitting: Hospice

## 2020-04-10 ENCOUNTER — Other Ambulatory Visit: Payer: Self-pay

## 2020-04-10 DIAGNOSIS — Z515 Encounter for palliative care: Secondary | ICD-10-CM

## 2020-04-10 DIAGNOSIS — N186 End stage renal disease: Secondary | ICD-10-CM

## 2020-04-10 MED FILL — NINLARO 3 MG CAPS: 3 | 28 days supply | Qty: 3 | Fill #1

## 2020-04-10 NOTE — Progress Notes (Signed)
[facility] Consult Note Telephone: [phone]  Fax: [phone]  PATIENT NAME:[name] [name] DOB:04-21-1946 YYQ:[id number]  PRIMARY CARE [place], Bayard Beaver, DO  REFERRING [place], Bayard Beaver, DO  RESPONSIBLE PARTY:[facility] son -[phone] [address] [phone] MKLKJZ : [phone]    RECOMMENDATIONS/PLAN:  [place] Planning/Goals of Care: Visit at the request of [place] following patient's ongoing decline and inability to consistently go for his dialysis and or stay for the full session when he goes. Visit consisted of building trust and  discussions on goals of care clarification. [name] and [place] joined by telephone for the visit. Extensive discussion today on goals of care clarification.  Against children's persuasion, patient affirmed he is a FULL code and would want everything done to keep him alive.  He said this is in line with his spiritual belief that God can heal him of all his diseases and grant him long life and good health. He said his goals of care include to maximize quality of life and symptom management. Lots of discussions today on what hospice would offer to patient, in response to patient/family's enquiry.  Maximized quality of life, symptom management, RN case management, social work involvement, MD involvement, chaplain involvement and hospice aide involvement etc were explained as some benefits. NP discussed with patient that hospice is for patients with prognosis of 6 months or less who are no longer pursuing aggressive treatment such as chemotherapy, dialysis. Patient also wanted to know if he could drive while on hospice.  It was explained to him that if he has a license and has the energy and ability to drive he can definitely drive. Children are on board for hospice service for patient; Chart review from walker, CMA 03/24/20  indicates oncology provider is trying to get patient with hospice. Patient is not ready at this time.  Ample emotional support provided.  Palliative care will continue to provide support to patient, family and the medical team.  Follow YI:AXKPVVZSMO care will continue to follow patient for goals of care clarification and symptom management.  Follow-up visit in a month. Symptom management: Patient with ongoing multiple myeloma; followed by oncology, currently on ixazomib.  Patient seen in ED 03/06/2020 for pain in his right medial thigh; he continues on hydrocodone for pain and it is effective;he denies numbness or tingling to his right foot or ankle.  Patient is not consistent with going for dialysis; when he manages to go, he is not able to stay for the full session.  Dialysis on Tues, Thurs and Sat. No coughing, no shortness of breath, no chest pain today. No recent COPD exacerbation, currently not on oxygen or breathing treatments.  Patient said his main need is getting help with shower and help with activities of daily living.  NP had reached out to PCP who responded that she had put in home aide request for patient. Home health requests denied thee times for medical instability, per [name].  [name] to follow-up with PCP to represent request.  She said she is expecting feedback from [employer] for patient.  Patient in no medical acuity.Encouraged ongoing care I spent1 hour and  76mnutes providing this consultation; time iincludes time spent with patient/family, chart review, provider coordination, and documentation. More than 50% of the time in this consultation was spent on coordinating communication  HISTORY OF PRESENT ILLNESS:Riaan A Rascoeis a 74y.o.year oldmalewith multiple medical problems including ESRD  on dialysis, COPD. Palliative Care was asked to help address goals of care.   CODE STATUS:Full  PPS:60% HOSPICE ELIGIBILITY/DIAGNOSIS: TBD  PAST MEDICAL HISTORY:  Past  Medical History:  Diagnosis Date  . Abnormal CT of liver    a. nodular contour suggesting cirrhosis 05/2018.  [name] Abnormal LFTs (liver function tests)   . Anxiety   . Aortic root dilatation ([place])   . Ascending aortic aneurysm ([place])   . BPH with urinary obstruction   . Bradycardia   . 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 CAD (coronary artery disease)    a. 12/2015 NSTEMI/Cath (in setting of PAF):  LM nl, LAD 30p,  LCX 77m RCA  ok, AM 100%, RPDA1 40, RPDA 2 60 ->Med Rx.  . Childhood asthma   . Chronic diastolic CHF (congestive heart failure) ([place]    a. 12/2015 Echo: EF 50-55%, mod AI, mod Ao root dil, mild MR, mod dil LA, mod RA.  . CKD (chronic kidney disease), stage III   . Colon cancer ([place]   . COPD (chronic obstructive pulmonary disease) ([place]    pt denies at preop  . Diabetes mellitus type II    diet controlled  . History of syncope   . Hyperlipidemia   . Hypertensive heart disease   . Kidney lump 04/04/2010   Overview:  Renal Cell Carcinoma   . 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 Moderate aortic insufficiency    a. 12/2015 Echo: Mod AI.  .Marland KitchenParoxysmal atrial fibrillation ([place]    a. 12/2015 started on Xarelto (CHA2DS2VASc = 4-5).  . Pneumonia   . Prostate cancer ([place]   . Renal cell carcinoma ([place]   . Sleep apnea    Does not like  CPAP  . Spinal stenosis in cervical region 10/12/2016  . Venous insufficiency     SOCIAL HX:  Social History   Tobacco Use  . Smoking status: Former Smoker    Types: Cigars    Quit date: 09/02/1977    Years since quitting: 42.6  . Smokeless tobacco: Never Used  . Tobacco comment:    Substance Use Topics  . Alcohol use: Not Currently    Comment: "stopped drinking alcohol in ~ 1980; just drank a little on the weekends when I did drink"    ALLERGIES:  Allergies  Allergen Reactions  . Amoxicillin Other (See Comments)    Tolerates Unasyn. Can't move Has patient  had a PCN reaction causing immediate rash, facial/tongue/throat swelling, SOB or lightheadedness with hypotension: No Has patient had a PCN reaction causing severe rash involving mucus membranes or skin necrosis: No Has patient had a PCN reaction that required hospitalization No Has patient had a PCN reaction occurring within the last 10 years: No If all of the above answers are "NO", then may proceed with Cephalosporin use.   .Marland KitchenPenicillins Other (See Comments)    Tolerates Unasyn. Can't move/dizziness Has patient had a PCN reaction causing immediate rash, facial/tongue/throat swelling, SOB or lightheadedness with hypotension: No Has patient had a PCN reaction causing severe rash involving mucus membranes or skin necrosis: No Has patient had a PCN reaction that required hospitalization No Has patient had a PCN reaction occurring within the last 10 years: No If all of the above answers are "NO", then may proceed with Cephalosporin use.       PERTINENT MEDICATIONS:  Outpatient Encounter Medications as of 04/10/2020  Medication Sig  .  amLODipine (NORVASC) 10 MG tablet Take 1 tablet (10 mg total) by mouth daily.  [name] atorvastatin (LIPITOR) 40 MG tablet Take 40 mg by mouth daily as needed (pt states he only takes when his cholesterol is high).   . cetirizine (ZYRTEC) 10 MG tablet TAKE 1 TABLET (10 MG TOTAL) BY MOUTH DAILY AS NEEDED (SEASONAL ALLERGIES). (Patient taking differently: Take 10 mg by mouth daily as needed for allergies (seasonal allergies). )  . diclofenac Sodium (VOLTAREN) 1 % GEL Apply 2 g topically 4 (four) times daily. (Patient taking differently: Apply 2 g topically 4 (four) times daily as needed (pain). )  . glucose blood (ONETOUCH ULTRA) test strip Check FSBS twice a day. Dx: E11.21, N18.32 (Patient taking differently: 1 each by Other route See admin instructions. Check FSBS twice a day. Dx: E11.21, N18.32)  . HYDROcodone-acetaminophen (NORCO/VICODIN) 5-325 MG tablet Take 1-2  tablets by mouth every 6 (six) hours as needed (gout pain). (Patient not taking: Reported on 03/09/2020)  . ixazomib citrate (NINLARO) 3 MG capsule Take 1 capsule (3 mg) by mouth weekly, 3 weeks on, 1 week off, repeat every 4 weeks. Take on an empty stomach 1hr before or 2hr after meals.  [name] oxyCODONE-acetaminophen (PERCOCET/ROXICET) 5-325 MG tablet Take 1 tablet by mouth every 6 (six) hours as needed for moderate pain or severe pain.  . tamsulosin (FLOMAX) 0.4 MG CAPS capsule Take 1 capsule (0.4 mg total) by mouth daily.   No facility-administered encounter medications on file as of 04/10/2020.    PHYSICAL EXAM/ROS:  General: NAD,cooperative Cardiovascular: regular rate and rhythm; denies chest pain Pulmonary: clear ant fields; no adventitious lung sounds on auscultation Abdomen: soft, nontender, + bowel sounds GU: no suprapubic tenderness Extremities: non pitting edema to BLE; dialysis tomorrow.  Skin: no rashesto exposed skin Neurological: Weakness but otherwise nonfocal Teodoro Spray, NP

## 2020-04-14 ENCOUNTER — Encounter (HOSPITAL_COMMUNITY): Payer: Self-pay

## 2020-04-14 ENCOUNTER — Other Ambulatory Visit: Payer: Self-pay

## 2020-04-14 ENCOUNTER — Emergency Department (HOSPITAL_COMMUNITY)
Admission: EM | Admit: 2020-04-14 | Discharge: 2020-04-15 | Disposition: A | Discharge status: Home / Self Care | Payer: Medicare Other | Attending: Emergency Medicine | Admitting: Emergency Medicine

## 2020-04-14 DIAGNOSIS — R55 Syncope and collapse: Secondary | ICD-10-CM | POA: Diagnosis present

## 2020-04-14 DIAGNOSIS — I5032 Chronic diastolic (congestive) heart failure: Secondary | ICD-10-CM | POA: Diagnosis not present

## 2020-04-14 DIAGNOSIS — E119 Type 2 diabetes mellitus without complications: Secondary | ICD-10-CM | POA: Diagnosis not present

## 2020-04-14 DIAGNOSIS — R1110 Vomiting, unspecified: Secondary | ICD-10-CM | POA: Insufficient documentation

## 2020-04-14 DIAGNOSIS — Z8546 Personal history of malignant neoplasm of prostate: Secondary | ICD-10-CM | POA: Insufficient documentation

## 2020-04-14 DIAGNOSIS — I2510 Atherosclerotic heart disease of native coronary artery without angina pectoris: Secondary | ICD-10-CM | POA: Diagnosis not present

## 2020-04-14 DIAGNOSIS — J45909 Unspecified asthma, uncomplicated: Secondary | ICD-10-CM | POA: Diagnosis not present

## 2020-04-14 DIAGNOSIS — Z87891 Personal history of nicotine dependence: Secondary | ICD-10-CM | POA: Insufficient documentation

## 2020-04-14 DIAGNOSIS — J4520 Mild intermittent asthma, uncomplicated: Secondary | ICD-10-CM | POA: Insufficient documentation

## 2020-04-14 DIAGNOSIS — J441 Chronic obstructive pulmonary disease with (acute) exacerbation: Secondary | ICD-10-CM | POA: Diagnosis not present

## 2020-04-14 DIAGNOSIS — Z79899 Other long term (current) drug therapy: Secondary | ICD-10-CM | POA: Insufficient documentation

## 2020-04-14 DIAGNOSIS — Z20822 Contact with and (suspected) exposure to covid-19: Secondary | ICD-10-CM | POA: Diagnosis not present

## 2020-04-14 DIAGNOSIS — Z791 Long term (current) use of non-steroidal anti-inflammatories (NSAID): Secondary | ICD-10-CM | POA: Insufficient documentation

## 2020-04-14 LAB — BASIC METABOLIC PANEL
Anion gap: 19 — ABNORMAL HIGH (ref 5–15)
BUN: 61 mg/dL — ABNORMAL HIGH (ref 8–23)
CO2: 26 mmol/L (ref 22–32)
Calcium: 10.5 mg/dL — ABNORMAL HIGH (ref 8.9–10.3)
Chloride: 91 mmol/L — ABNORMAL LOW (ref 98–111)
Creatinine, Ser: 12.76 mg/dL — ABNORMAL HIGH (ref 0.61–1.24)
GFR calc Af Amer: 4 mL/min — ABNORMAL LOW (ref >60)
GFR calc non Af Amer: 3 mL/min — ABNORMAL LOW (ref >60)
Glucose, Bld: 131 mg/dL — ABNORMAL HIGH (ref 70–99)
Potassium: 4.4 mmol/L (ref 3.5–5.1)
Sodium: 136 mmol/L (ref 135–145)

## 2020-04-14 LAB — CBC
HCT: 24.7 % — ABNORMAL LOW (ref 39.0–52.0)
Hemoglobin: 7.7 g/dL — ABNORMAL LOW (ref 13.0–17.0)
MCH: 31.3 pg (ref 26.0–34.0)
MCHC: 31.2 g/dL (ref 30.0–36.0)
MCV: 100.4 fL — ABNORMAL HIGH (ref 80.0–100.0)
Platelets: 231 10*3/uL (ref 150–400)
RBC: 2.46 MIL/uL — ABNORMAL LOW (ref 4.22–5.81)
RDW: 19.7 % — ABNORMAL HIGH (ref 11.5–15.5)
WBC: 6.9 10*3/uL (ref 4.0–10.5)
nRBC: 0.6 % — ABNORMAL HIGH (ref 0.0–0.2)

## 2020-04-14 NOTE — ED Triage Notes (Signed)
Pt BIB [facility] for eval of syncopal episode while walking home, got home, had N/V x 6 once arrived home. Reports blood clot in the R leg that they "havent done anything for". States feeling weak, missed dialysis.

## 2020-04-14 NOTE — ED Notes (Signed)
Pt saturating 90% on RA, RN notified. Pt placed on 2 L/min. Spo2 95%.

## 2020-04-15 LAB — PREPARE RBC (CROSSMATCH)

## 2020-04-15 LAB — SARS CORONAVIRUS 2 BY RT PCR (HOSPITAL ORDER, PERFORMED IN ~~LOC~~ HOSPITAL LAB): SARS Coronavirus 2: NEGATIVE

## 2020-04-15 LAB — HEPATITIS B SURFACE ANTIGEN: Hepatitis B Surface Ag: NONREACTIVE

## 2020-04-15 LAB — HEPATITIS B CORE ANTIBODY, TOTAL: Hep B Core Total Ab: NONREACTIVE

## 2020-04-15 MED ORDER — CHLORHEXIDINE GLUCONATE CLOTH 2 % EX PADS: 6.0000 | MEDICATED_PAD | Freq: Every day | CUTANEOUS | Status: DC

## 2020-04-15 MED ORDER — HEPARIN SODIUM (PORCINE) 1000 UNIT/ML DIALYSIS
3000.0000 [IU] | INTRAMUSCULAR | Status: DC | PRN
  Filled 2020-04-15 (×2): qty 3

## 2020-04-15 MED ORDER — PENTAFLUOROPROP-TETRAFLUOROETH EX AERO
1.0000 "application " | INHALATION_SPRAY | CUTANEOUS | Status: DC | PRN
  Filled 2020-04-15: qty 116

## 2020-04-15 MED ORDER — HEPARIN SODIUM (PORCINE) 1000 UNIT/ML DIALYSIS
1000.0000 [IU] | INTRAMUSCULAR | Status: DC | PRN
  Filled 2020-04-15: qty 1

## 2020-04-15 MED ORDER — SODIUM CHLORIDE 0.9% IV SOLUTION: Freq: Once | INTRAVENOUS | Status: DC

## 2020-04-15 MED ORDER — ALTEPLASE 2 MG IJ SOLR: 2.0000 mg | Freq: Once | INTRAMUSCULAR | Status: DC | PRN

## 2020-04-15 MED ORDER — LIDOCAINE HCL (PF) 1 % IJ SOLN: 5.0000 mL | INTRAMUSCULAR | Status: DC | PRN

## 2020-04-15 MED ORDER — SODIUM CHLORIDE 0.9 % IV SOLN: 100.0000 mL | INTRAVENOUS | Status: DC | PRN

## 2020-04-15 MED ORDER — HEPARIN SODIUM (PORCINE) 1000 UNIT/ML IJ SOLN
INTRAMUSCULAR | Status: AC
  Administered 2020-04-15: 3000 [IU] via INTRAVENOUS_CENTRAL
  Filled 2020-04-15: qty 3

## 2020-04-15 MED ORDER — LIDOCAINE-PRILOCAINE 2.5-2.5 % EX CREA
1.0000 "application " | TOPICAL_CREAM | CUTANEOUS | Status: DC | PRN
  Filled 2020-04-15: qty 5

## 2020-04-15 NOTE — Progress Notes (Signed)
[facility] Progress Note   Subjective:   [name] is a 74 year old male who presented to ED on 04/14/20 with nausea and loss of consciousness. Patient dialyzes on TTS schedule but did not go to dialysis on Thursday because he did not feel well. Last HD was 04/11/20 for 3 hours 19 minutes. He does have a pattern of frequently shortening and missing dialysis. He complained of L leg pain which is chronic in nature. Also reported SOB. In the ED, BP moderately elevated. K+ 4.4, Cr 12.76, BUN 61, Hgb 7.7. Patient has a history of multiple myeloma and has required blood transfusions in the past. He reports he feels he needs another transfusion today. He does have a history of CAD. Patient denies leg pain at present. Denies abdominal pain, nausea, vomiting, diarrhea, CP, palpitations and dizziness. Reports ongoing weakness.  Objective Vitals:   04/15/20 1002 04/15/20 1032 04/15/20 1200 04/15/20 1214  BP: (!) 145/94 (!) 142/92 (!) 145/95   Pulse: 72 72 72 72  Resp: (!) 26 (!) 25 14 15   Temp:      TempSrc:      SpO2: 100% 99% (!) 89% 94%   Physical Exam General: well developed male, alert and in NAD Heart: RRR, no murmurs, rubs or gallops Lungs: Diminished lung sounds in b/l bases. CTA bilaterally without wheezing or rales Abdomen: Soft, non-tender, non-distended, +BS. Reducible ventral hernia  Extremities: 2+ edema bilateral lower extremities  Dialysis Access: R IJ TDC currently accessed  Additional Objective Labs: Basic Metabolic Panel: Recent Labs  Lab 04/14/20 1718  NA 136  K 4.4  CL 91*  CO2 26  GLUCOSE 131*  BUN 61*  CREATININE 12.76*  CALCIUM 10.5*   CBC: Recent Labs  Lab 04/14/20 1718  WBC 6.9  HGB 7.7*  HCT 24.7*  MCV 100.4*  PLT 231   Blood Culture    Component Value Date/Time   SDES  06/04/2019 0449    URINE, CLEAN CATCH Performed at [facility], [place] [address], [place], [place] [address]    SPECREQUEST  06/04/2019 1700     NONE Performed at [facility], [place] [address] [place], [place] [address]    CULT (A) 06/04/2019 0449    <10,000 COLONIES/mL INSIGNIFICANT GROWTH Performed at [facility] [address] [place], [place] [address]    REPTSTATUS 06/05/2019 FINAL 06/04/2019 0449   Medications: . sodium chloride    . sodium chloride     . sodium chloride   Intravenous Once  . Chlorhexidine Gluconate Cloth  6 each Topical Q0600  . heparin sodium (porcine)        Dialysis Orders: [facility] on TTS 180NRe, 4hr 18mn, BFR 400, BFR 800, EDW 87kg, UF Profile 2, TDC, heparin 8600 unit bolus Micera 200 mcg IV q 2 weeks- last dose 7/29  Assessment/Plan: 1. Syncope/nausea: Unclear cause, BP is elevated. Nausea and vomiting now resolved. Hgb 7.7 and hx of multiple myeloma and CAD, will transfuse 1 unit PRBC on HD.  2. ESRD: TTS schedule, last HD was Tuesday. Dialyzing here today while administering blood transfusion. He does appear volume overloaded. UF goal today 3L as tolerated. Encouraged compliance with outpatient dialysis and fluid restrictions to prevent recurrent volume overload. 3. HTN/volume:  UF with HD as above. 4. Anemia:  Hgb 7.7, Hx multiple myeloma and renal cell carcinoma, receiving 1 unit PRBC. Received ESA as outpatient. Follow up with hematology/oncology outpatient. 5. Secondary hyperparathyroidism: Ca elevated today, hold  VDRA.    6. Nutrition:  Renal diet with fluid restriction. No albumin reported.   Anice Paganini, PA-C 04/15/2020, 2:19 PM  [employer]

## 2020-04-15 NOTE — Procedures (Signed)
Pt admitted for "extended" HD session, then will return to ED for re-eval before dc home.  Pt stable on HD now, getting 1u prbc.    I was present at this dialysis session, have reviewed the session itself and made  appropriate changes Kelly Splinter MD [place] pager [phone]   04/15/2020, 8:16 PM

## 2020-04-15 NOTE — ED Notes (Signed)
Pt doesn't make much urine, but aware we need urine sample

## 2020-04-15 NOTE — ED Provider Notes (Signed)
[facility] EMERGENCY DEPARTMENT Provider Note   CSN: [id number] Arrival date & time: 04/14/20  1658     History Chief Complaint  Patient presents with  . Loss of Consciousness    [name] [name] is a 74 y.o. male.  Patient is a 74 year old male with history of coronary artery disease, end-stage renal disease on hemodialysis, CHF, COPD, diabetes.  Patient presents today for evaluation of a syncopal episode that occurred yesterday evening.  Patient describes multiple episodes of vomiting followed by passing out.  Patient was due to be dialyzed on Thursday, but missed for reasons I do not fully understand.  Patient was due to be dialyzed this morning at 7 AM, but has been in the waiting room overnight.  He denies to me he is feeling short of breath.  His only complaint right now is pain in his right leg, however this appears chronic.  He has had multiple vascular studies performed showing no DVT.  The history is provided by the patient.       Past Medical History:  Diagnosis Date  . Abnormal CT of liver    a. nodular contour suggesting cirrhosis 05/2018.  [name] Abnormal LFTs (liver function tests)   . Anxiety   . Aortic root dilatation ([place])   . Ascending aortic aneurysm ([place])   . BPH with urinary obstruction   . Bradycardia   . 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 CAD (coronary artery disease)    a. 12/2015 NSTEMI/Cath (in setting of PAF):  LM nl, LAD 30p,  LCX 67m, RCA  ok, AM 100%, RPDA1 40, RPDA 2 60 ->Med Rx.  . Childhood asthma   . Chronic diastolic CHF (congestive heart failure) ([place])    a. 12/2015 Echo: EF 50-55%, mod AI, mod Ao root dil, mild MR, mod dil LA, mod RA.  . CKD (chronic kidney disease), stage III   . Colon cancer ([place])   . COPD (chronic obstructive pulmonary disease) ([place])    pt denies at preop  . Diabetes mellitus type II    diet controlled  . History of  syncope   . Hyperlipidemia   . Hypertensive heart disease   . Kidney lump 04/04/2010   Overview:  Renal Cell Carcinoma   . 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 Moderate aortic insufficiency    a. 12/2015 Echo: Mod AI.  [name] Paroxysmal atrial fibrillation ([place])    a. 12/2015 started on Xarelto (CHA2DS2VASc = 4-5).  . Pneumonia   . Prostate cancer ([place])   . Renal cell carcinoma ([place])   . Sleep apnea    Does not like  CPAP  . Spinal stenosis in cervical region 10/12/2016  . Venous insufficiency     Patient Active Problem List   Diagnosis Date Noted  . Aortic root enlargement ([place]) 02/24/2020  . Symptomatic anemia 02/04/2020  . ESRD (end stage renal disease) ([place]) 01/27/2020  . Goals of care, counseling/discussion   . Palliative care by specialist   . DNR (do not resuscitate) discussion   . ARF (acute renal failure) ([place]) 01/22/2020  . Ascending aortic aneurysm ([place]) 08/04/2019  . 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 CKD (chronic kidney disease), stage III 06/04/2019  . Renal cell carcinoma ([place]) 06/04/2019  . Gross hematuria 06/04/2019  . COPD exacerbation ([place]) 11/05/2018  . Palpitations 10/22/2018  . Counseling regarding advance care planning and goals of care 06/22/2018  .  AKI (acute kidney injury) ([place])   . Anemia   . Sepsis ([place]) 06/05/2018  . Acute lower UTI 06/05/2018  . Colon polyp 05/21/2018  . Aortic valve regurgitation 04/14/2018  . Medication management 04/14/2018  . Thoracic aortic aneurysm without rupture ([place]) 09/29/2017  . 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 Head trauma, subsequent encounter 10/12/2016  . 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 Spinal stenosis in cervical region 10/12/2016  . Cervical spondylosis 10/12/2016  . CAD (coronary artery disease)   . Chronic diastolic CHF (congestive heart failure) ([place])   . Hyperlipidemia   . Hypertensive heart disease   . Paroxysmal  atrial fibrillation ([place]) 12/17/2015  . 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 Mild intermittent asthma 10/18/2015  . Aortic root dilatation ([place]) 10/17/2015  . Diabetes ([place]) 10/17/2015  . 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 Hand paresthesia 12/09/2014  . Elevated prostate specific antigen (PSA) 07/12/2014  . Allergic rhinitis 07/12/2014  . LBP (low back pain) 07/12/2014  . Palpitation 07/11/2014  . Patient's other noncompliance with medication regimen 08/10/2013  . Chronic venous insufficiency 07/23/2012  . Obstructive apnea 04/11/2011  . Essential (primary) hypertension 09/07/2010  . 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 Anxiety, generalized 04/04/2010  . Cardiac conduction disorder 02/27/2010  . BPH (benign prostatic hyperplasia) 09/13/2009  . Abnormal findings on examination of genitourinary organs 08/09/2009  . Hereditary and idiopathic neuropathy 07/05/2009    Past Surgical History:  Procedure Laterality Date  . AV FISTULA PLACEMENT Left 01/26/2020   Procedure: left ARTERIOVENOUS (AV) FISTULA CREATION;  Surgeon: Waynetta Sandy, MD;  Location: [facility];  Service: Vascular;  Laterality: Left;  . BACK SURGERY    . CARDIAC CATHETERIZATION N/A 12/18/2015   Procedure: Left Heart Cath and Coronary Angiography;  Surgeon: Leonie Man, MD;  Location: [facility];  Service: Cardiovascular;  Laterality: N/A;  . DIAGNOSTIC LAPAROSCOPY     partial colectomy  . IR FLUORO GUIDE CV LINE RIGHT  06/28/2019  . IR FLUORO GUIDE CV LINE RIGHT  01/24/2020  . IR REMOVAL TUN CV CATH W/O FL  07/26/2019  . IR US GUIDE VASC ACCESS RIGHT  06/28/2019  . IR US GUIDE VASC ACCESS RIGHT  01/24/2020  . LAPAROSCOPIC PARTIAL COLECTOMY Right 05/21/2018   Procedure: LAPAROSCOPIC RIGHT  COLECTOMY ERAS PATHWAY;  Surgeon: Leighton Ruff, MD;  Location: [facility];  Service: General;  Laterality: Right;  . [place]   "replaced a disc"         Family History  Problem Relation Age of Onset  . Emphysema Father   . Asthma Father   . Liver disease Father        tumor  . Heart disease Mother   . Hypertension Mother   . Asthma Sister   . Hypertension Son     Social History   Tobacco Use  . Smoking status: Former Smoker    Types: Cigars    Quit date: 09/02/1977    Years since quitting: 42.6  . Smokeless tobacco: Never Used  . Tobacco comment:    Vaping Use  . Vaping Use: Never used  Substance Use Topics  . Alcohol use: Not Currently    Comment: "stopped drinking alcohol in ~ 1980; just drank a little on the weekends when I did drink"  . Drug use: No    Home  Medications Prior to Admission medications   Medication Sig Start Date End Date Taking? Authorizing Provider  amLODipine (NORVASC) 10 MG tablet Take 1 tablet (10 mg total) by mouth daily. 01/12/20   [name], DO  atorvastatin (LIPITOR) 40 MG tablet Take 40 mg by mouth daily as needed (pt states he only takes when his cholesterol is high).     [provider]  cetirizine (ZYRTEC) 10 MG tablet TAKE 1 TABLET (10 MG TOTAL) BY MOUTH DAILY AS NEEDED (SEASONAL ALLERGIES). Patient taking differently: Take 10 mg by mouth daily as needed for allergies (seasonal allergies).  11/12/19   [name], DO  diclofenac Sodium (VOLTAREN) 1 % GEL Apply 2 g topically 4 (four) times daily. Patient taking differently: Apply 2 g topically 4 (four) times daily as needed (pain).  03/03/20   Dagoberto Ligas, PA-C  glucose blood (ONETOUCH ULTRA) test strip Check FSBS twice a day. Dx: E11.21, N18.32 Patient taking differently: 1 each by Other route See admin instructions. Check FSBS twice a day. Dx: E11.21, N18.32 08/16/19   Fulp, Ander Gaster, MD  HYDROcodone-acetaminophen (NORCO/VICODIN) 5-325 MG tablet Take 1-2 tablets by mouth every 6 (six) hours as needed (gout pain). Patient not taking: Reported on 03/09/2020 12/29/19   Loura Halt A, NP  ixazomib  citrate (NINLARO) 3 MG capsule Take 1 capsule (3 mg) by mouth weekly, 3 weeks on, 1 week off, repeat every 4 weeks. Take on an empty stomach 1hr before or 2hr after meals. 03/10/20   Brunetta Genera, MD  oxyCODONE-acetaminophen (PERCOCET/ROXICET) 5-325 MG tablet Take 1 tablet by mouth every 6 (six) hours as needed for moderate pain or severe pain. 03/29/20   Brunetta Genera, MD  tamsulosin (FLOMAX) 0.4 MG CAPS capsule Take 1 capsule (0.4 mg total) by mouth daily. 10/15/19   [name], DO    Allergies    Amoxicillin and Penicillins  Review of Systems   Review of Systems  All other systems reviewed and are negative.   Physical Exam Updated Vital Signs BP (!) 146/94 (BP Location: Right Arm)   Pulse 78   Temp 98 F (36.7 C) (Oral)   Resp 16   SpO2 94%   Physical Exam Vitals and nursing note reviewed.  Constitutional:      General: He is not in acute distress.    Appearance: He is well-developed. He is not diaphoretic.  HENT:     Head: Normocephalic and atraumatic.  Cardiovascular:     Rate and Rhythm: Normal rate and regular rhythm.     Heart sounds: No murmur heard.  No friction rub.  Pulmonary:     Effort: Pulmonary effort is normal. No respiratory distress.     Breath sounds: Normal breath sounds. No wheezing or rales.  Abdominal:     General: Bowel sounds are normal. There is no distension.     Palpations: Abdomen is soft.     Tenderness: There is no abdominal tenderness.  Musculoskeletal:        General: Normal range of motion.     Cervical back: Normal range of motion and neck supple.     Right lower leg: Edema present.     Left lower leg: Edema present.     Comments: There is 2+ pitting edema of both lower extremities.  DP pulses are palpable.  Skin:    General: Skin is warm and dry.  Neurological:     Mental Status: He is alert and oriented to person, place, and  time.     Coordination: Coordination normal.     ED Results / Procedures /  Treatments   Labs (all labs ordered are listed, but only abnormal results are displayed) Labs Reviewed  BASIC METABOLIC PANEL - Abnormal; Notable for the following components:      Result Value   Chloride 91 (*)    Glucose, Bld 131 (*)    BUN 61 (*)    Creatinine, Ser 12.76 (*)    Calcium 10.5 (*)    GFR calc non Af Amer 3 (*)    GFR calc Af Amer 4 (*)    Anion gap 19 (*)    All other components within normal limits  CBC - Abnormal; Notable for the following components:   RBC 2.46 (*)    Hemoglobin 7.7 (*)    HCT 24.7 (*)    MCV 100.4 (*)    RDW 19.7 (*)    nRBC 0.6 (*)    All other components within normal limits  URINALYSIS, ROUTINE W REFLEX MICROSCOPIC  CBG MONITORING, ED    EKG EKG Interpretation  Date/Time:  Friday April 14 2020 17:00:39 EDT Ventricular Rate:  86 PR Interval:  154 QRS Duration: 102 QT Interval:  334 QTC Calculation: 399 R Axis:   -81 Text Interpretation: Normal sinus rhythm with sinus arrhythmia Left anterior fascicular block Nonspecific T wave abnormality Abnormal ECG No significant change since 03/21/2020 Confirmed by Veryl Speak [id number]) on 04/15/2020 7:03:18 AM   Radiology No results found.  Procedures Procedures (including critical care time)  Medications Ordered in ED Medications - No data to display  ED Course  I have reviewed the triage vital signs and the nursing notes.  Pertinent labs & imaging results that were available during my care of the patient were reviewed by me and considered in my medical decision making (see chart for details).    MDM Rules/Calculators/A&P  Patient with extensive past medical history as described in the HPI presenting after a syncopal episode that occurred yesterday.  He also reports vomiting yesterday, however both of these episodes have resolved.  Patient apparently missed his dialysis on Thursday and is due to be dialyzed this morning, however is already late for his appointment.  Patient's  laboratory studies show anemia with hemoglobin of 7.7.  His electrolytes show an elevated BUN and creatinine consistent with his baseline dialysis labs.  As patient has missed dialysis for the past 2 sessions, I have discussed the care with Dr. Jonnie Finner from nephrology.  He is making arrangements for the patient to be dialyzed at the hospital, then likely discharged afterward.  I see no indication at this time for admission.  Patient to be reassessed after dialysis.  If he is doing well, discharge is anticipated.  Final Clinical Impression(s) / ED Diagnoses Final diagnoses:  None    Rx / DC Orders ED Discharge Orders    None       Veryl Speak, MD 04/16/20 [phone]

## 2020-04-16 LAB — TYPE AND SCREEN
ABO/RH(D): A POS
Antibody Screen: NEGATIVE
Unit division: 0

## 2020-04-16 LAB — BPAM RBC
Blood Product Expiration Date: 202108272359
ISSUE DATE / TIME: 202108141322
Unit Type and Rh: 6200

## 2020-04-17 LAB — HEPATITIS B SURFACE ANTIBODY, QUANTITATIVE: Hep B S AB Quant (Post): <3.1 m[IU]/mL — ABNORMAL LOW (ref >9.9)

## 2020-04-19 ENCOUNTER — Emergency Department (HOSPITAL_COMMUNITY)
Admission: EM | Admit: 2020-04-19 | Discharge: 2020-04-19 | Disposition: A | Discharge status: Home / Self Care | Payer: Medicare Other | Attending: Emergency Medicine | Admitting: Emergency Medicine

## 2020-04-19 ENCOUNTER — Other Ambulatory Visit: Payer: Self-pay

## 2020-04-19 DIAGNOSIS — R55 Syncope and collapse: Secondary | ICD-10-CM | POA: Insufficient documentation

## 2020-04-19 DIAGNOSIS — Z5321 Procedure and treatment not carried out due to patient leaving prior to being seen by health care provider: Secondary | ICD-10-CM | POA: Insufficient documentation

## 2020-04-19 DIAGNOSIS — R0602 Shortness of breath: Secondary | ICD-10-CM | POA: Diagnosis present

## 2020-04-19 LAB — CBC
HCT: 26.2 % — ABNORMAL LOW (ref 39.0–52.0)
Hemoglobin: 8.3 g/dL — ABNORMAL LOW (ref 13.0–17.0)
MCH: 31.1 pg (ref 26.0–34.0)
MCHC: 31.7 g/dL (ref 30.0–36.0)
MCV: 98.1 fL (ref 80.0–100.0)
Platelets: 197 10*3/uL (ref 150–400)
RBC: 2.67 MIL/uL — ABNORMAL LOW (ref 4.22–5.81)
RDW: 20.1 % — ABNORMAL HIGH (ref 11.5–15.5)
WBC: 6.1 10*3/uL (ref 4.0–10.5)
nRBC: 0 % (ref 0.0–0.2)

## 2020-04-19 LAB — BASIC METABOLIC PANEL
Anion gap: 17 — ABNORMAL HIGH (ref 5–15)
BUN: 60 mg/dL — ABNORMAL HIGH (ref 8–23)
CO2: 21 mmol/L — ABNORMAL LOW (ref 22–32)
Calcium: 10.3 mg/dL (ref 8.9–10.3)
Chloride: 91 mmol/L — ABNORMAL LOW (ref 98–111)
Creatinine, Ser: 12.29 mg/dL — ABNORMAL HIGH (ref 0.61–1.24)
GFR calc Af Amer: 4 mL/min — ABNORMAL LOW (ref >60)
GFR calc non Af Amer: 4 mL/min — ABNORMAL LOW (ref >60)
Glucose, Bld: 116 mg/dL — ABNORMAL HIGH (ref 70–99)
Potassium: 5.2 mmol/L — ABNORMAL HIGH (ref 3.5–5.1)
Sodium: 129 mmol/L — ABNORMAL LOW (ref 135–145)

## 2020-04-19 NOTE — ED Triage Notes (Addendum)
Pt here via EMS from home. Endorses shob this morning and got up to walk outside. Sat down outside and had witnessed syncopal episode by neighbor, out for approx 30 seconds. No fall or injury. Dialysis patient, missed Tuesday but had full tx on Saturday.

## 2020-04-20 ENCOUNTER — Other Ambulatory Visit: Payer: Self-pay

## 2020-04-20 ENCOUNTER — Encounter (HOSPITAL_COMMUNITY): Payer: Self-pay

## 2020-04-20 ENCOUNTER — Emergency Department (HOSPITAL_COMMUNITY): Payer: Medicare Other

## 2020-04-20 ENCOUNTER — Emergency Department (HOSPITAL_COMMUNITY)
Admission: EM | Admit: 2020-04-20 | Discharge: 2020-04-21 | Disposition: A | Discharge status: Home / Self Care | Payer: Medicare Other | Attending: Emergency Medicine | Admitting: Emergency Medicine

## 2020-04-20 ENCOUNTER — Telehealth: Payer: Self-pay

## 2020-04-20 DIAGNOSIS — Z87891 Personal history of nicotine dependence: Secondary | ICD-10-CM | POA: Insufficient documentation

## 2020-04-20 DIAGNOSIS — C61 Malignant neoplasm of prostate: Secondary | ICD-10-CM | POA: Diagnosis not present

## 2020-04-20 DIAGNOSIS — Z992 Dependence on renal dialysis: Secondary | ICD-10-CM | POA: Diagnosis not present

## 2020-04-20 DIAGNOSIS — Z20822 Contact with and (suspected) exposure to covid-19: Secondary | ICD-10-CM | POA: Insufficient documentation

## 2020-04-20 DIAGNOSIS — R0602 Shortness of breath: Secondary | ICD-10-CM | POA: Diagnosis present

## 2020-04-20 DIAGNOSIS — I2510 Atherosclerotic heart disease of native coronary artery without angina pectoris: Secondary | ICD-10-CM | POA: Diagnosis not present

## 2020-04-20 DIAGNOSIS — C649 Malignant neoplasm of unspecified kidney, except renal pelvis: Secondary | ICD-10-CM | POA: Insufficient documentation

## 2020-04-20 DIAGNOSIS — I132 Hypertensive heart and chronic kidney disease with heart failure and with stage 5 chronic kidney disease, or end stage renal disease: Secondary | ICD-10-CM | POA: Insufficient documentation

## 2020-04-20 DIAGNOSIS — C189 Malignant neoplasm of colon, unspecified: Secondary | ICD-10-CM | POA: Diagnosis not present

## 2020-04-20 DIAGNOSIS — E1159 Type 2 diabetes mellitus with other circulatory complications: Secondary | ICD-10-CM | POA: Diagnosis not present

## 2020-04-20 DIAGNOSIS — Z79899 Other long term (current) drug therapy: Secondary | ICD-10-CM | POA: Insufficient documentation

## 2020-04-20 DIAGNOSIS — I5032 Chronic diastolic (congestive) heart failure: Secondary | ICD-10-CM | POA: Insufficient documentation

## 2020-04-20 DIAGNOSIS — R42 Dizziness and giddiness: Secondary | ICD-10-CM | POA: Diagnosis not present

## 2020-04-20 DIAGNOSIS — N186 End stage renal disease: Secondary | ICD-10-CM | POA: Diagnosis not present

## 2020-04-20 DIAGNOSIS — J441 Chronic obstructive pulmonary disease with (acute) exacerbation: Secondary | ICD-10-CM | POA: Diagnosis not present

## 2020-04-20 DIAGNOSIS — E875 Hyperkalemia: Secondary | ICD-10-CM

## 2020-04-20 LAB — HEPATIC FUNCTION PANEL
ALT: 11 U/L (ref 0–44)
AST: 24 U/L (ref 15–41)
Albumin: 3 g/dL — ABNORMAL LOW (ref 3.5–5.0)
Alkaline Phosphatase: 58 U/L (ref 38–126)
Bilirubin, Direct: 0.2 mg/dL (ref 0.0–0.2)
Indirect Bilirubin: 0.8 mg/dL (ref 0.3–0.9)
Total Bilirubin: 1 mg/dL (ref 0.3–1.2)
Total Protein: 6.4 g/dL — ABNORMAL LOW (ref 6.5–8.1)

## 2020-04-20 LAB — BASIC METABOLIC PANEL
Anion gap: 19 — ABNORMAL HIGH (ref 5–15)
BUN: 68 mg/dL — ABNORMAL HIGH (ref 8–23)
CO2: 22 mmol/L (ref 22–32)
Calcium: 10.6 mg/dL — ABNORMAL HIGH (ref 8.9–10.3)
Chloride: 91 mmol/L — ABNORMAL LOW (ref 98–111)
Creatinine, Ser: 13.85 mg/dL — ABNORMAL HIGH (ref 0.61–1.24)
GFR calc Af Amer: 4 mL/min — ABNORMAL LOW (ref >60)
GFR calc non Af Amer: 3 mL/min — ABNORMAL LOW (ref >60)
Glucose, Bld: 104 mg/dL — ABNORMAL HIGH (ref 70–99)
Potassium: 5.7 mmol/L — ABNORMAL HIGH (ref 3.5–5.1)
Sodium: 132 mmol/L — ABNORMAL LOW (ref 135–145)

## 2020-04-20 LAB — CBC
HCT: 26 % — ABNORMAL LOW (ref 39.0–52.0)
Hemoglobin: 8.3 g/dL — ABNORMAL LOW (ref 13.0–17.0)
MCH: 31.3 pg (ref 26.0–34.0)
MCHC: 31.9 g/dL (ref 30.0–36.0)
MCV: 98.1 fL (ref 80.0–100.0)
Platelets: 193 10*3/uL (ref 150–400)
RBC: 2.65 MIL/uL — ABNORMAL LOW (ref 4.22–5.81)
RDW: 20.3 % — ABNORMAL HIGH (ref 11.5–15.5)
WBC: 6 10*3/uL (ref 4.0–10.5)
nRBC: 0.3 % — ABNORMAL HIGH (ref 0.0–0.2)

## 2020-04-20 LAB — SARS CORONAVIRUS 2 BY RT PCR (HOSPITAL ORDER, PERFORMED IN ~~LOC~~ HOSPITAL LAB): SARS Coronavirus 2: NEGATIVE

## 2020-04-20 LAB — PHOSPHORUS: Phosphorus: 8.4 mg/dL — ABNORMAL HIGH (ref 2.5–4.6)

## 2020-04-20 MED ORDER — SODIUM ZIRCONIUM CYCLOSILICATE 10 G PO PACK
10.0000 g | PACK | Freq: Once | ORAL | Status: AC
  Administered 2020-04-20: 10 g via ORAL
  Filled 2020-04-20: qty 1

## 2020-04-20 MED ORDER — PENTAFLUOROPROP-TETRAFLUOROETH EX AERO
1.0000 | INHALATION_SPRAY | CUTANEOUS | Status: DC | PRN
  Filled 2020-04-20: qty 116

## 2020-04-20 MED ORDER — ACETAMINOPHEN 325 MG PO TABS
650.0000 mg | ORAL_TABLET | Freq: Once | ORAL | Status: AC
  Administered 2020-04-20: 650 mg via ORAL
  Filled 2020-04-20: qty 2

## 2020-04-20 MED ORDER — LIDOCAINE-PRILOCAINE 2.5-2.5 % EX CREA
1.0000 | TOPICAL_CREAM | CUTANEOUS | Status: DC | PRN
  Filled 2020-04-20: qty 5

## 2020-04-20 MED ORDER — CHLORHEXIDINE GLUCONATE CLOTH 2 % EX PADS: 6.0000 | MEDICATED_PAD | Freq: Every day | CUTANEOUS | Status: DC

## 2020-04-20 MED ORDER — AMLODIPINE BESYLATE 5 MG PO TABS: 10.0000 mg | ORAL_TABLET | Freq: Every day | ORAL | Status: DC

## 2020-04-20 MED ORDER — SODIUM CHLORIDE 0.9 % IV SOLN: 100.0000 mL | INTRAVENOUS | Status: DC | PRN

## 2020-04-20 MED ORDER — FERRIC CITRATE 1 GM 210 MG(FE) PO TABS
420.0000 mg | ORAL_TABLET | Freq: Three times a day (TID) | ORAL | Status: DC
  Filled 2020-04-20 (×4): qty 2

## 2020-04-20 MED ORDER — ATORVASTATIN CALCIUM 40 MG PO TABS: 40.0000 mg | ORAL_TABLET | Freq: Every day | ORAL | Status: DC | PRN

## 2020-04-20 MED ORDER — HEPARIN SODIUM (PORCINE) 1000 UNIT/ML DIALYSIS
3000.0000 [IU] | INTRAMUSCULAR | Status: DC | PRN
  Filled 2020-04-20: qty 3

## 2020-04-20 MED ORDER — ALTEPLASE 2 MG IJ SOLR: 2.0000 mg | Freq: Once | INTRAMUSCULAR | Status: DC | PRN

## 2020-04-20 MED ORDER — LIDOCAINE HCL (PF) 1 % IJ SOLN: 5.0000 mL | INTRAMUSCULAR | Status: DC | PRN

## 2020-04-20 NOTE — Progress Notes (Signed)
[facility]  Brief Progress Note  Received call from Dr. Darl Householder regarding this pt Has missed last 4 HD treatments, last HD rx was 8/10 and he signed off 1 hr early Came to ED for SOB There was possible admission to hospice--> however pt is very clear he would like to continue dialysis Will arrange HD here as he is hypoxemic on 2-3 L O2 Reassessment after HD in ED--> if off O2 could d/c from renal perspective with resumption of OP HD schedule.  Call with questions.  Madelon Lips MD [facility] pgr [phone]

## 2020-04-20 NOTE — ED Provider Notes (Signed)
[facility] EMERGENCY DEPARTMENT Provider Note   CSN: [id number] Arrival date & time: 04/20/20  6195     History No chief complaint on file.   [name] [name] is a 74 y.o. male hx of ESRD on HD (last HD was 6 days ago per patient nephrology said that he missed 5 treatments), here presenting with shortness of breath and needing dialysis.  Patient apparently missed several dialysis due to shortness of breath.  Patient states that he missed 2 dialysis this week.  In fact, patient was seen in the ED last week for missing dialysis.  He came to the ED yesterday and left without being seen.  He states that he feels lightheaded and dizzy and felt short of breath.  Apparently he was supposed to get vascular procedure next week but patient apparently told vascular surgeon that he was thinking of going to hospice.  I verified it with the patient and patient states that he still wants dialysis and no longer wants hospice but needs some more help at home.  Denies any fevers or chills.  The history is provided by the patient.       Past Medical History:  Diagnosis Date  . Abnormal CT of liver    a. nodular contour suggesting cirrhosis 05/2018.  [name] Abnormal LFTs (liver function tests)   . Anxiety   . Aortic root dilatation ([place])   . Ascending aortic aneurysm ([place])   . BPH with urinary obstruction   . Bradycardia   . 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 CAD (coronary artery disease)    a. 12/2015 NSTEMI/Cath (in setting of PAF):  LM nl, LAD 30p,  LCX 4m, RCA  ok, AM 100%, RPDA1 40, RPDA 2 60 ->Med Rx.  . Childhood asthma   . Chronic diastolic CHF (congestive heart failure) ([place])    a. 12/2015 Echo: EF 50-55%, mod AI, mod Ao root dil, mild MR, mod dil LA, mod RA.  . CKD (chronic kidney disease), stage III   . Colon cancer ([place])   . COPD (chronic obstructive pulmonary disease) ([place])    pt denies at  preop  . Diabetes mellitus type II    diet controlled  . History of syncope   . Hyperlipidemia   . Hypertensive heart disease   . Kidney lump 04/04/2010   Overview:  Renal Cell Carcinoma   . 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 Moderate aortic insufficiency    a. 12/2015 Echo: Mod AI.  [name] Paroxysmal atrial fibrillation ([place])    a. 12/2015 started on Xarelto (CHA2DS2VASc = 4-5).  . Pneumonia   . Prostate cancer ([place])   . Renal cell carcinoma ([place])   . Sleep apnea    Does not like  CPAP  . Spinal stenosis in cervical region 10/12/2016  . Venous insufficiency     Patient Active Problem List   Diagnosis Date Noted  . Aortic root enlargement ([place]) 02/24/2020  . Symptomatic anemia 02/04/2020  . ESRD (end stage renal disease) ([place]) 01/27/2020  . Goals of care, counseling/discussion   . Palliative care by specialist   . DNR (do not resuscitate) discussion   . ARF (acute renal failure) ([place]) 01/22/2020  . Ascending aortic aneurysm ([place]) 08/04/2019  . 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 CKD (chronic kidney disease), stage III 06/04/2019  . Renal cell carcinoma ([place]) 06/04/2019  . Gross hematuria 06/04/2019  . COPD  exacerbation ([place]) 11/05/2018  . Palpitations 10/22/2018  . Counseling regarding advance care planning and goals of care 06/22/2018  . AKI (acute kidney injury) ([place])   . Anemia   . Sepsis ([place]) 06/05/2018  . Acute lower UTI 06/05/2018  . Colon polyp 05/21/2018  . Aortic valve regurgitation 04/14/2018  . Medication management 04/14/2018  . Thoracic aortic aneurysm without rupture ([place]) 09/29/2017  . 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 Head trauma, subsequent encounter 10/12/2016  . 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 Spinal stenosis in cervical region 10/12/2016  . Cervical spondylosis 10/12/2016  . CAD (coronary artery disease)   . Chronic diastolic CHF (congestive heart failure) ([place])    . Hyperlipidemia   . Hypertensive heart disease   . Paroxysmal atrial fibrillation ([place]) 12/17/2015  . 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 Mild intermittent asthma 10/18/2015  . Aortic root dilatation ([place]) 10/17/2015  . Diabetes ([place]) 10/17/2015  . 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 Hand paresthesia 12/09/2014  . Elevated prostate specific antigen (PSA) 07/12/2014  . Allergic rhinitis 07/12/2014  . LBP (low back pain) 07/12/2014  . Palpitation 07/11/2014  . Patient's other noncompliance with medication regimen 08/10/2013  . Chronic venous insufficiency 07/23/2012  . Obstructive apnea 04/11/2011  . Essential (primary) hypertension 09/07/2010  . 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 Anxiety, generalized 04/04/2010  . Cardiac conduction disorder 02/27/2010  . BPH (benign prostatic hyperplasia) 09/13/2009  . Abnormal findings on examination of genitourinary organs 08/09/2009  . Hereditary and idiopathic neuropathy 07/05/2009    Past Surgical History:  Procedure Laterality Date  . AV FISTULA PLACEMENT Left 01/26/2020   Procedure: left ARTERIOVENOUS (AV) FISTULA CREATION;  Surgeon: Waynetta Sandy, MD;  Location: [place];  Service: Vascular;  Laterality: Left;  . BACK SURGERY    . CARDIAC CATHETERIZATION N/A 12/18/2015   Procedure: Left Heart Cath and Coronary Angiography;  Surgeon: Leonie Man, MD;  Location: [place] [facility];  Service: Cardiovascular;  Laterality: N/A;  . DIAGNOSTIC LAPAROSCOPY     partial colectomy  . IR FLUORO GUIDE CV LINE RIGHT  06/28/2019  . IR FLUORO GUIDE CV LINE RIGHT  01/24/2020  . IR REMOVAL TUN CV CATH W/O FL  07/26/2019  . IR US GUIDE VASC ACCESS RIGHT  06/28/2019  . IR US GUIDE VASC ACCESS RIGHT  01/24/2020  . LAPAROSCOPIC PARTIAL COLECTOMY Right 05/21/2018   Procedure: LAPAROSCOPIC RIGHT  COLECTOMY ERAS PATHWAY;  Surgeon: Leighton Ruff, MD;  Location: [facility];  Service: General;   Laterality: Right;  . [place]   "replaced a disc"       Family History  Problem Relation Age of Onset  . Emphysema Father   . Asthma Father   . Liver disease Father        tumor  . Heart disease Mother   . Hypertension Mother   . Asthma Sister   . Hypertension Son     Social History   Tobacco Use  . Smoking status: Former Smoker    Types: Cigars    Quit date: 09/02/1977    Years since quitting: 42.6  . Smokeless tobacco: Never Used  . Tobacco comment:    Vaping Use  . Vaping Use: Never used  Substance Use Topics  . Alcohol use: Not Currently    Comment: "stopped drinking alcohol in ~ 1980;  just drank a little on the weekends when I did drink"  . Drug use: No    Home Medications Prior to Admission medications   Medication Sig Start Date End Date Taking? Authorizing Provider  amLODipine (NORVASC) 10 MG tablet Take 1 tablet (10 mg total) by mouth daily. 01/12/20  Yes [name], DO  atorvastatin (LIPITOR) 40 MG tablet Take 40 mg by mouth daily as needed (pt states he only takes when his cholesterol is high).    Yes [provider]  AURYXIA 1 GM 210 MG(Fe) tablet Take 420 mg by mouth 3 (three) times daily. 03/23/20  Yes [provider]  cetirizine (ZYRTEC) 10 MG tablet TAKE 1 TABLET (10 MG TOTAL) BY MOUTH DAILY AS NEEDED (SEASONAL ALLERGIES). Patient taking differently: Take 10 mg by mouth daily as needed for allergies (seasonal allergies).  11/12/19  Yes [name], DO  diclofenac Sodium (VOLTAREN) 1 % GEL Apply 2 g topically 4 (four) times daily. Patient taking differently: Apply 2 g topically 4 (four) times daily as needed (pain).  03/03/20  Yes Dagoberto Ligas, PA-C  glucose blood (ONETOUCH ULTRA) test strip Check FSBS twice a day. Dx: E11.21, N18.32 Patient taking differently: 1 each by Other route See admin instructions. Check FSBS twice a day. Dx: E11.21, N18.32 08/16/19  Yes Fulp, Cammie, MD    HYDROcodone-acetaminophen (NORCO/VICODIN) 5-325 MG tablet Take 1-2 tablets by mouth every 6 (six) hours as needed (gout pain). 12/29/19  Yes Bast, Traci A, NP  ixazomib citrate (NINLARO) 3 MG capsule Take 1 capsule (3 mg) by mouth weekly, 3 weeks on, 1 week off, repeat every 4 weeks. Take on an empty stomach 1hr before or 2hr after meals. Patient taking differently: Take 3 mg by mouth See admin instructions. Take 1 capsule (3 mg) by mouth weekly, 3 weeks on, 1 week off, repeat every 4 weeks. Take on an empty stomach 1hr before or 2hr after meals. 03/10/20  Yes Brunetta Genera, MD  tamsulosin (FLOMAX) 0.4 MG CAPS capsule Take 1 capsule (0.4 mg total) by mouth daily. 10/15/19  Yes [name], DO  oxyCODONE-acetaminophen (PERCOCET/ROXICET) 5-325 MG tablet Take 1 tablet by mouth every 6 (six) hours as needed for moderate pain or severe pain. Patient not taking: Reported on 04/20/2020 03/29/20   Brunetta Genera, MD    Allergies    Amoxicillin and Penicillins  Review of Systems   Review of Systems  Respiratory: Positive for shortness of breath.   All other systems reviewed and are negative.   Physical Exam Updated Vital Signs BP (!) 148/102   Pulse 73   Temp 98 F (36.7 C) (Oral)   Resp 16   Ht 5\' 8"  (1.727 m)   Wt 90.3 kg   SpO2 100%   BMI 30.26 kg/m   Physical Exam Vitals and nursing note reviewed.  Constitutional:      Comments: Chronically ill, tachypneic   HENT:     Head: Normocephalic.     Nose: Nose normal.     Mouth/Throat:     Mouth: Mucous membranes are moist.  Eyes:     Extraocular Movements: Extraocular movements intact.     Pupils: Pupils are equal, round, and reactive to light.  Cardiovascular:     Rate and Rhythm: Normal rate and regular rhythm.     Pulses: Normal pulses.     Heart sounds: Normal heart sounds.  Pulmonary:     Comments: Crackles bilateral bases  Abdominal:  General: Abdomen is flat.     Palpations: Abdomen is soft.   Musculoskeletal:        General: Normal range of motion.     Cervical back: Normal range of motion.     Comments: 1+ edema bilaterally   Skin:    General: Skin is warm.     Capillary Refill: Capillary refill takes less than 2 seconds.  Neurological:     General: No focal deficit present.     Mental Status: He is alert and oriented to person, place, and time.  Psychiatric:        Mood and Affect: Mood normal.        Behavior: Behavior normal.     ED Results / Procedures / Treatments   Labs (all labs ordered are listed, but only abnormal results are displayed) Labs Reviewed  BASIC METABOLIC PANEL - Abnormal; Notable for the following components:      Result Value   Sodium 132 (*)    Potassium 5.7 (*)    Chloride 91 (*)    Glucose, Bld 104 (*)    BUN 68 (*)    Creatinine, Ser 13.85 (*)    Calcium 10.6 (*)    GFR calc non Af Amer 3 (*)    GFR calc Af Amer 4 (*)    Anion gap 19 (*)    All other components within normal limits  CBC - Abnormal; Notable for the following components:   RBC 2.65 (*)    Hemoglobin 8.3 (*)    HCT 26.0 (*)    RDW 20.3 (*)    nRBC 0.3 (*)    All other components within normal limits  SARS CORONAVIRUS 2 BY RT PCR [facility] ORDER, [facility] LAB)    EKG EKG Interpretation  Date/Time:  Thursday April 20 2020 07:37:51 EDT Ventricular Rate:  78 PR Interval:  160 QRS Duration: 110 QT Interval:  392 QTC Calculation: 446 R Axis:   -74 Text Interpretation: Sinus rhythm with Premature supraventricular complexes Left anterior fascicular block Nonspecific T wave abnormality Abnormal ECG No significant change since last tracing Confirmed by Wandra Arthurs [phone]) on 04/20/2020 3:58:06 PM   Radiology DG Chest 2 View  Result Date: 04/20/2020 CLINICAL DATA:  Chest pain, SOB EXAM: CHEST - 2 VIEW COMPARISON:  03/21/2020 chest radiograph and prior. FINDINGS: Tunneled right IJ catheter tip overlies the superior right atrium.  Cardiomegaly and central pulmonary venous congestion. No focal airspace opacities. No pneumothorax or pleural effusion. No acute osseous abnormality. IMPRESSION: Cardiomegaly and central pulmonary venous congestion. No focal airspace disease or frank edema. Electronically Signed   By: Primitivo Gauze M.D.   On: 04/20/2020 08:26    Procedures Procedures (including critical care time)  Medications Ordered in ED Medications  sodium zirconium cyclosilicate (LOKELMA) packet 10 g (has no administration in time range)    ED Course  I have reviewed the triage vital signs and the nursing notes.  Pertinent labs & imaging results that were available during my care of the patient were reviewed by me and considered in my medical decision making (see chart for details).    MDM Rules/Calculators/A&P                          Deanna A Kiker is a 74 y.o. male here with SOB, dizziness. Patient has missed multiple sessions already. I talked to patient and case management also talked to patient about hospice.  Patient states that he still wants dialysis and therefore does not qualify for hospice.  Patient's potassium is 5.7.  I talked to Dr. Hollie Salk from nephrology who will dialyze patient.  Patient has no oxygen requirement right now and likely can be discharged after dialysis.  Case manager saw patient and will put in home health orders to get more help at home.  Final Clinical Impression(s) / ED Diagnoses Final diagnoses:  None    Rx / DC Orders ED Discharge Orders    None       Drenda Freeze, MD 04/23/20 [phone]

## 2020-04-20 NOTE — Telephone Encounter (Signed)
Pt's son call stating pt would like to proceed with procedure. Pt scheduled for 2nd stage BVT Lt arm on 8/24 with Dr. Donzetta Matters on 04/25/20. Covid test scheduled.

## 2020-04-20 NOTE — Progress Notes (Signed)
   04/20/20 2215  TOC ED Mini Assessment  TOC Time spent with patient (minutes): 65  PING Used in TOC Assessment No  Admission or Readmission Diverted Yes  Interventions which prevented an admission or readmission [place] or Services;Other (must enter comment)  What brought you to the Emergency Department?  "Dizziness, and I missed Dialysis"  ED CM met with patient at bedside and he is requesting [facility] for [facility] services, CM completed the online referral request [facility] and PCS service also faxed in referral as well via [facility]. Updated patient before going upstairs to HD, ED CM will follow up  with the referral tomorrow.

## 2020-04-20 NOTE — Care Management (Signed)
ED CM and [place] ,  met  With patient at bedside, patient reports needing assistance with managing his care at home patient lives alone and is on HD T/Th/ Sat. Patient is currently active with [facility]. CM received handoff that patient was desiring hospice services, However once the differences palliative vs hospice patient was very clear in not desiring hospice services.  Patient states he would like to receive services which can better hlp him manage his care at home and continue with HD ED CM provided information on [facility] and PCA service, patient verbalized understanding.  Updated Dr.Yao EDP, ED evaluation still in progress.

## 2020-04-20 NOTE — Progress Notes (Addendum)
Manufacturing engineer (ACC) **Addendum:  This RN met at bedside with pt who verbalized desire to continue with HD.  CM Wanda at bedside as well and will follow up with home health at pts request.  Pt will continue with outpt palliative.** [facility] referral center was contacted by Mr. Ganser's son to self-refer for hospice services.  Mr. Mcminn currently receives outpatient palliative care in the community through [facility]. Received request from [facility] referral center to follow up with Mr. Derise who is waiting to be seen at [facility] ED.   Chart and pt information under review by [facility] physician.  Hospice eligibility pending at this time.  Hospital liaison spoke with Karle Starch, pt's son, to initiate education related to hospice philosophy and services and to answer any questions at this time.  Karle Starch verbalized understanding of information given.   ACC information and contact numbers given to [facility].  Above information shared with Mikey Kirschner Manager.  Please call with any questions or concerns.  Thank you for the opportunity to participate in this pt's care.  Domenic Moras, BSN, RN [employer] [phone] (24h on call)

## 2020-04-20 NOTE — Discharge Planning (Signed)
RNCM contacted by [facility] for possible admission to [facility].

## 2020-04-20 NOTE — ED Notes (Signed)
Hemodialysis consent signed.

## 2020-04-20 NOTE — Telephone Encounter (Signed)
Attempted to r/s patient for 2nd stage BVT Lt arm procedure with Dr. Donzetta Matters. Patient requested I contact his son [name].   Spoke with son who advised that pt has agreed to hospice and family is trying to get him placed. So surgery not needed at this point.

## 2020-04-20 NOTE — ED Triage Notes (Signed)
Patient arrived by [facility] for increased weakness and SOB. Patient speaking complete sentences. Missed dialysis Tuesday and today. Alert and oriented.

## 2020-04-21 ENCOUNTER — Telehealth: Payer: Self-pay | Admitting: *Deleted

## 2020-04-21 MED ORDER — HEPARIN SODIUM (PORCINE) 1000 UNIT/ML IJ SOLN
INTRAMUSCULAR | Status: AC
  Filled 2020-04-21: qty 4

## 2020-04-21 NOTE — Discharge Planning (Signed)
RNCM consulted regarding contacting pt son for transportation home.  RNCM called son and told him his dad was ready and waiting at ED entrance.  Son very Patent attorney.

## 2020-04-21 NOTE — Progress Notes (Signed)
   04/21/20 0220  Hand-Off documentation  Handoff Given Given to shift RN/LPN  Report given to (Full Name) Joie Bimler, RN  Handoff Received Received from shift RN/LPN  Report received from (Full Name) Dajahnae Vondra<rn  Vital Signs  Temp 98.2 F (36.8 C)  Temp Source Oral  Pulse Rate (!) 103  Pulse Rate Source Monitor  Resp 18  BP 125/81  BP Location Right Arm  BP Method Automatic  Oxygen Therapy  SpO2 93 %  O2 Device Room Air  Pain Assessment  Pain Scale 0-10  Pain Score 0  Post-Hemodialysis Assessment  Rinseback Volume (mL) 250 mL  KECN 286 V  Dialyzer Clearance Lightly streaked  Duration of HD Treatment -hour(s) 4 hour(s)  Hemodialysis Intake (mL) 700 mL  UF Total -Machine (mL) 4734 mL  Net UF (mL) 4034 mL  Tolerated HD Treatment Yes  Post-Hemodialysis Comments tx achieved as expected, no complaint.  AVG/AVF Arterial Site Held (minutes) 0 minutes  AVG/AVF Venous Site Held (minutes) 0 minutes  Education / Care Plan  Dialysis Education Provided Yes  Note  Observations pt stable,

## 2020-04-21 NOTE — Telephone Encounter (Signed)
Pt son [name] [name] called requesting referral be placed for Hospice. Called [facility] and placed referral.

## 2020-04-21 NOTE — Telephone Encounter (Signed)
Katrina from [facility] called stating pt wanted to continue dialysis but can not if he's under Hospice. Called pt son [name], He stated pt changed his mind and wanted to remain on palliative.

## 2020-04-21 NOTE — ED Notes (Signed)
Breakfast ordered 

## 2020-04-21 NOTE — Telephone Encounter (Signed)
Pt will continue on palliative. Per pt son [name] [name].

## 2020-04-21 NOTE — ED Notes (Signed)
Called [facility] for transport home--[name] [name] 

## 2020-04-21 NOTE — ED Provider Notes (Signed)
Seen following dialysis.  Has returned is feeling improved off O2.  No CP, no SOB.  No n/v/d.  No weakness, no numbness.  No headache.   Physical Exam  BP (!) 164/84   Pulse 92   Temp 98 F (36.7 C) (Oral)   Resp 19   Ht 5\' 8"  (1.727 m)   Wt 90.3 kg   SpO2 96%   BMI 30.26 kg/m   Physical Exam NCAT PERRL MMM RRR CTAB NABS, soft non tender   ED Course/Procedures     Procedures went to dialysis and returned    Stable for discharge with close follow up.    Return for intractable cough, coughing up blood,fevers >100.4 unrelieved by medication, shortness of breath, intractable vomiting, chest pain, shortness of breath, weakness,numbness, changes in speech, facial asymmetry,abdominal pain, passing out,Inability to tolerate liquids or food, cough, altered mental status or any concerns. No signs of systemic illness or infection. The patient is nontoxic-appearing on exam and vital signs are within normal limits.   I have reviewed the triage vital signs and the nursing notes. Pertinent labs &imaging results that were available during my care of the patient were reviewed by me and considered in my medical decision making (see chart for details).After history, exam, and medical workup I feel the patient has beenappropriately medically screened and is safe for discharge home. Pertinent diagnoses were discussed with the patient. Patient was given return precautions.          Dellene Mcgroarty, MD 04/21/20 2158

## 2020-04-22 ENCOUNTER — Telehealth (HOSPITAL_COMMUNITY): Payer: Self-pay | Admitting: Nephrology

## 2020-04-24 ENCOUNTER — Telehealth: Payer: Self-pay | Admitting: Internal Medicine

## 2020-04-24 ENCOUNTER — Other Ambulatory Visit (HOSPITAL_COMMUNITY): Payer: Medicare Other

## 2020-04-24 ENCOUNTER — Telehealth: Payer: Self-pay

## 2020-04-24 NOTE — Telephone Encounter (Signed)
Patient son called saying that the walker was never picked up and would like a script for the wheelchair. Please f/u

## 2020-04-24 NOTE — Telephone Encounter (Signed)
(  5:07p) SW returned call to patient's son-[name] [name]. He advised that he was calling to get more information about hospice and the palliative care program as he wanted to know why his dad refused hospice services. SW provided education. He stated that he now understood as his dad wanted to continue with dialysis. [name] [name] stated that his dad needed more care when he returns to work following [employer]. SW provided education regarding PCS services to include the referral procress. [name] [name] thought this might be helpful to his dad and he requested that SW proceed with the referral process.   *Palliative Care NP updated via email.

## 2020-04-24 NOTE — Telephone Encounter (Signed)
[name] notified that patient does not qualify for services via [employer]. Sharon(patient's daughter) was given information as far as ALF, CAP, PACE on 03/30/2020. Advised to reach out to her to see what decision was made as far as a program for patient.  Also requested that he reach out to see if patient received the Esthefany Herrig bc insurance will sometimes not pay for another DME product this close together.

## 2020-04-24 NOTE — Telephone Encounter (Signed)
Can you write a paper prescription for a wheelchair for me to fax to [facility]?

## 2020-04-24 NOTE — Telephone Encounter (Signed)
TC from [place] at [facility] requesting if Dr. Irene Limbo will be attending for hospice care. Informed Alwyn Ren that Dr. Irene Limbo will not be attending for Pt.

## 2020-04-24 NOTE — Telephone Encounter (Signed)
Patients son called in and requested for a script for a wheelchair to be sent to [employer] supply on [address] in [place]. Patients son also requested for a Home Health Evaluation order to be sent to [facility]. Please follow up at your earliest convenience.

## 2020-04-25 ENCOUNTER — Encounter (HOSPITAL_COMMUNITY): Admission: RE | Payer: Self-pay | Source: Home / Self Care

## 2020-04-25 ENCOUNTER — Ambulatory Visit (HOSPITAL_COMMUNITY): Admission: RE | Admit: 2020-04-25 | Payer: Medicare Other | Source: Home / Self Care | Admitting: Vascular Surgery

## 2020-04-25 ENCOUNTER — Emergency Department (HOSPITAL_COMMUNITY): Payer: Medicare Other

## 2020-04-25 ENCOUNTER — Inpatient Hospital Stay (HOSPITAL_COMMUNITY)
Admission: EM | Admit: 2020-04-25 | Discharge: 2020-05-02 | DRG: 981 | Disposition: A | Discharge status: Skilled Nursing Facility | Payer: Medicare Other | Attending: Internal Medicine | Admitting: Internal Medicine

## 2020-04-25 ENCOUNTER — Encounter (HOSPITAL_COMMUNITY): Payer: Self-pay | Admitting: Emergency Medicine

## 2020-04-25 ENCOUNTER — Telehealth: Payer: Self-pay

## 2020-04-25 ENCOUNTER — Other Ambulatory Visit: Payer: Self-pay

## 2020-04-25 DIAGNOSIS — I252 Old myocardial infarction: Secondary | ICD-10-CM

## 2020-04-25 DIAGNOSIS — N186 End stage renal disease: Secondary | ICD-10-CM | POA: Diagnosis present

## 2020-04-25 DIAGNOSIS — I132 Hypertensive heart and chronic kidney disease with heart failure and with stage 5 chronic kidney disease, or end stage renal disease: Secondary | ICD-10-CM | POA: Diagnosis present

## 2020-04-25 DIAGNOSIS — N401 Enlarged prostate with lower urinary tract symptoms: Secondary | ICD-10-CM | POA: Diagnosis present

## 2020-04-25 DIAGNOSIS — E8770 Fluid overload, unspecified: Secondary | ICD-10-CM | POA: Diagnosis not present

## 2020-04-25 DIAGNOSIS — N189 Chronic kidney disease, unspecified: Secondary | ICD-10-CM

## 2020-04-25 DIAGNOSIS — Z9119 Patient's noncompliance with other medical treatment and regimen: Secondary | ICD-10-CM

## 2020-04-25 DIAGNOSIS — I480 Paroxysmal atrial fibrillation: Secondary | ICD-10-CM | POA: Diagnosis present

## 2020-04-25 DIAGNOSIS — Z85038 Personal history of other malignant neoplasm of large intestine: Secondary | ICD-10-CM

## 2020-04-25 DIAGNOSIS — D631 Anemia in chronic kidney disease: Secondary | ICD-10-CM | POA: Diagnosis present

## 2020-04-25 DIAGNOSIS — Z881 Allergy status to other antibiotic agents status: Secondary | ICD-10-CM

## 2020-04-25 DIAGNOSIS — I712 Thoracic aortic aneurysm, without rupture: Secondary | ICD-10-CM | POA: Diagnosis present

## 2020-04-25 DIAGNOSIS — Z79899 Other long term (current) drug therapy: Secondary | ICD-10-CM

## 2020-04-25 DIAGNOSIS — Z8616 Personal history of COVID-19: Secondary | ICD-10-CM

## 2020-04-25 DIAGNOSIS — Z20822 Contact with and (suspected) exposure to covid-19: Secondary | ICD-10-CM | POA: Diagnosis present

## 2020-04-25 DIAGNOSIS — I10 Essential (primary) hypertension: Secondary | ICD-10-CM | POA: Diagnosis present

## 2020-04-25 DIAGNOSIS — Z87891 Personal history of nicotine dependence: Secondary | ICD-10-CM

## 2020-04-25 DIAGNOSIS — C649 Malignant neoplasm of unspecified kidney, except renal pelvis: Secondary | ICD-10-CM | POA: Diagnosis present

## 2020-04-25 DIAGNOSIS — R55 Syncope and collapse: Secondary | ICD-10-CM | POA: Diagnosis not present

## 2020-04-25 DIAGNOSIS — I2510 Atherosclerotic heart disease of native coronary artery without angina pectoris: Secondary | ICD-10-CM | POA: Diagnosis present

## 2020-04-25 DIAGNOSIS — E875 Hyperkalemia: Secondary | ICD-10-CM

## 2020-04-25 DIAGNOSIS — M79602 Pain in left arm: Secondary | ICD-10-CM | POA: Diagnosis not present

## 2020-04-25 DIAGNOSIS — Z9115 Patient's noncompliance with renal dialysis: Secondary | ICD-10-CM

## 2020-04-25 DIAGNOSIS — C9000 Multiple myeloma not having achieved remission: Secondary | ICD-10-CM | POA: Diagnosis present

## 2020-04-25 DIAGNOSIS — E8889 Other specified metabolic disorders: Secondary | ICD-10-CM | POA: Diagnosis present

## 2020-04-25 DIAGNOSIS — Z880 Allergy status to penicillin: Secondary | ICD-10-CM

## 2020-04-25 DIAGNOSIS — R531 Weakness: Secondary | ICD-10-CM

## 2020-04-25 DIAGNOSIS — J449 Chronic obstructive pulmonary disease, unspecified: Secondary | ICD-10-CM | POA: Diagnosis present

## 2020-04-25 DIAGNOSIS — F419 Anxiety disorder, unspecified: Secondary | ICD-10-CM | POA: Diagnosis present

## 2020-04-25 DIAGNOSIS — E1122 Type 2 diabetes mellitus with diabetic chronic kidney disease: Secondary | ICD-10-CM | POA: Diagnosis present

## 2020-04-25 DIAGNOSIS — I5032 Chronic diastolic (congestive) heart failure: Secondary | ICD-10-CM | POA: Diagnosis present

## 2020-04-25 DIAGNOSIS — Z825 Family history of asthma and other chronic lower respiratory diseases: Secondary | ICD-10-CM

## 2020-04-25 DIAGNOSIS — M4802 Spinal stenosis, cervical region: Secondary | ICD-10-CM | POA: Diagnosis present

## 2020-04-25 DIAGNOSIS — E871 Hypo-osmolality and hyponatremia: Secondary | ICD-10-CM

## 2020-04-25 DIAGNOSIS — K5900 Constipation, unspecified: Secondary | ICD-10-CM | POA: Diagnosis present

## 2020-04-25 DIAGNOSIS — Z8249 Family history of ischemic heart disease and other diseases of the circulatory system: Secondary | ICD-10-CM

## 2020-04-25 DIAGNOSIS — D649 Anemia, unspecified: Secondary | ICD-10-CM | POA: Diagnosis present

## 2020-04-25 DIAGNOSIS — N400 Enlarged prostate without lower urinary tract symptoms: Secondary | ICD-10-CM | POA: Diagnosis present

## 2020-04-25 DIAGNOSIS — R451 Restlessness and agitation: Secondary | ICD-10-CM | POA: Diagnosis not present

## 2020-04-25 DIAGNOSIS — N2581 Secondary hyperparathyroidism of renal origin: Secondary | ICD-10-CM | POA: Diagnosis present

## 2020-04-25 DIAGNOSIS — Z992 Dependence on renal dialysis: Secondary | ICD-10-CM

## 2020-04-25 DIAGNOSIS — E785 Hyperlipidemia, unspecified: Secondary | ICD-10-CM | POA: Diagnosis present

## 2020-04-25 DIAGNOSIS — G4733 Obstructive sleep apnea (adult) (pediatric): Secondary | ICD-10-CM | POA: Diagnosis present

## 2020-04-25 DIAGNOSIS — K439 Ventral hernia without obstruction or gangrene: Secondary | ICD-10-CM | POA: Diagnosis present

## 2020-04-25 DIAGNOSIS — E1151 Type 2 diabetes mellitus with diabetic peripheral angiopathy without gangrene: Secondary | ICD-10-CM | POA: Diagnosis present

## 2020-04-25 DIAGNOSIS — Z8546 Personal history of malignant neoplasm of prostate: Secondary | ICD-10-CM

## 2020-04-25 DIAGNOSIS — R9431 Abnormal electrocardiogram [ECG] [EKG]: Secondary | ICD-10-CM

## 2020-04-25 HISTORY — DX: Anemia, unspecified: D64.9

## 2020-04-25 HISTORY — DX: Cardiac arrhythmia, unspecified: I49.9

## 2020-04-25 HISTORY — DX: Peripheral vascular disease, unspecified: I73.9

## 2020-04-25 LAB — I-STAT CHEM 8, ED
BUN: 67 mg/dL — ABNORMAL HIGH (ref 8–23)
Calcium, Ion: 1.04 mmol/L — ABNORMAL LOW (ref 1.15–1.40)
Chloride: 96 mmol/L — ABNORMAL LOW (ref 98–111)
Creatinine, Ser: 14.8 mg/dL — ABNORMAL HIGH (ref 0.61–1.24)
Glucose, Bld: 115 mg/dL — ABNORMAL HIGH (ref 70–99)
HCT: 23 % — ABNORMAL LOW (ref 39.0–52.0)
Hemoglobin: 7.8 g/dL — ABNORMAL LOW (ref 13.0–17.0)
Potassium: 5.5 mmol/L — ABNORMAL HIGH (ref 3.5–5.1)
Sodium: 128 mmol/L — ABNORMAL LOW (ref 135–145)
TCO2: 20 mmol/L — ABNORMAL LOW (ref 22–32)

## 2020-04-25 LAB — CBC
HCT: 25.9 % — ABNORMAL LOW (ref 39.0–52.0)
Hemoglobin: 8 g/dL — ABNORMAL LOW (ref 13.0–17.0)
MCH: 30.8 pg (ref 26.0–34.0)
MCHC: 30.9 g/dL (ref 30.0–36.0)
MCV: 99.6 fL (ref 80.0–100.0)
Platelets: 193 10*3/uL (ref 150–400)
RBC: 2.6 MIL/uL — ABNORMAL LOW (ref 4.22–5.81)
RDW: 20.8 % — ABNORMAL HIGH (ref 11.5–15.5)
WBC: 8 10*3/uL (ref 4.0–10.5)
nRBC: 0 % (ref 0.0–0.2)

## 2020-04-25 LAB — BASIC METABOLIC PANEL
Anion gap: 20 — ABNORMAL HIGH (ref 5–15)
BUN: 67 mg/dL — ABNORMAL HIGH (ref 8–23)
CO2: 21 mmol/L — ABNORMAL LOW (ref 22–32)
Calcium: 10.2 mg/dL (ref 8.9–10.3)
Chloride: 92 mmol/L — ABNORMAL LOW (ref 98–111)
Creatinine, Ser: 13.66 mg/dL — ABNORMAL HIGH (ref 0.61–1.24)
GFR calc Af Amer: 4 mL/min — ABNORMAL LOW (ref >60)
GFR calc non Af Amer: 3 mL/min — ABNORMAL LOW (ref >60)
Glucose, Bld: 118 mg/dL — ABNORMAL HIGH (ref 70–99)
Potassium: 5.5 mmol/L — ABNORMAL HIGH (ref 3.5–5.1)
Sodium: 133 mmol/L — ABNORMAL LOW (ref 135–145)

## 2020-04-25 SURGERY — TRANSPOSITION, VEIN, BASILIC
Anesthesia: Monitor Anesthesia Care | Laterality: Left

## 2020-04-25 MED ORDER — SODIUM ZIRCONIUM CYCLOSILICATE 5 G PO PACK
5.0000 g | PACK | Freq: Every day | ORAL | Status: DC
  Administered 2020-04-26 – 2020-04-27 (×3): 5 g via ORAL
  Filled 2020-04-25 (×6): qty 1

## 2020-04-25 NOTE — ED Provider Notes (Signed)
[facility] EMERGENCY DEPARTMENT Provider Note   CSN: [id number] Arrival date & time: 04/25/20  1414     History Chief Complaint  Patient presents with  . Shortness of Breath  . Near Syncope    [name] [name] is a 74 y.o. male history ESRD, a sending aortic aneurysm, CAD, diabetes, COPD, hyperlipidemia, hypertension, multiple myeloma, A. fib, anemia.  Patient presents today with short of breath, generalized weakness, nausea vomiting and syncope.  Patient reports the symptoms are secondary to anemia and he is requesting 2 units of blood.  Patient reports over the past 2 weeks he has had difficulty keeping down his food he has had multiple episodes of nonbloody/nonbilious emesis.  He reports over the last 1 week he has developed generalized fatigue and weakness without focal weakness.  He reports that he missed 2 dialysis sessions due to his fatigue, last dialysis session was on Friday, August 20.  He reports that over the last 1 week he has also developed shortness of breath and difficulty getting around his home.  He reports that he passed out twice today when he was attempting to get off of his recliner to turn off the TV, he reports his son had to turn him on his side to help the blood get back to his head.  Patient denies any fever/chills, headache/vision changes, numbness/tingling, focal weakness, chest pain, abdominal pain, abnormal extremity swelling/color change or any additional concerns. --------------------------------- Of note patient had been on 4 L nasal cannula prior to evaluation. Unclear why o2 was applied. He denies use of oxygen at home.  Submental oxygen was removed, throughout exam and history SPO2 remained 100%, RN will continue monitor. HPI     Past Medical History:  Diagnosis Date  . Abnormal CT of liver    a. nodular contour suggesting cirrhosis 05/2018.  [name] Abnormal LFTs (liver function tests)   . Anxiety   . Aortic root dilatation ([place])   .  Ascending aortic aneurysm ([place])   . BPH with urinary obstruction   . Bradycardia   . 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 CAD (coronary artery disease)    a. 12/2015 NSTEMI/Cath (in setting of PAF):  LM nl, LAD 30p,  LCX 50m RCA  ok, AM 100%, RPDA1 40, RPDA 2 60 ->Med Rx.  . Childhood asthma   . Chronic diastolic CHF (congestive heart failure) ([place]Ste. [facility]    a. 12/2015 Echo: EF 50-55%, mod AI, mod Ao root dil, mild MR, mod dil LA, mod RA.  . CKD (chronic kidney disease), stage III   . Colon cancer ([place]   . COPD (chronic obstructive pulmonary disease) ([facility]    pt denies at preop  . Diabetes mellitus type II    diet controlled  . History of syncope   . Hyperlipidemia   . Hypertensive heart disease   . Kidney lump 04/04/2010   Overview:  Renal Cell Carcinoma   . Light chain myeloma ([place]ed[place]   . Moderate aortic insufficiency    a. 12/2015 Echo: Mod AI.  .Marland KitchenParoxysmal atrial fibrillation (HCementon    a. 12/2015 started on Xarelto (CHA2DS2VASc = 4-5).  . Pneumonia   . Prostate cancer ([facility]   . Renal cell carcinoma ([place]   . Sleep apnea    Does not like  CPAP  . Spinal stenosis in cervical region 10/12/2016  . Venous insufficiency     Patient Active Problem  List   Diagnosis Date Noted  . Aortic root enlargement ([place]) 02/24/2020  . Symptomatic anemia 02/04/2020  . ESRD (end stage renal disease) ([place]) 01/27/2020  . Goals of care, counseling/discussion   . Palliative care by specialist   . DNR (do not resuscitate) discussion   . ARF (acute renal failure) ([place]) 01/22/2020  . Ascending aortic aneurysm ([place]) 08/04/2019  . Myeloma kidney ([place])   . CKD (chronic kidney disease), stage III 06/04/2019  . Renal cell carcinoma ([place]) 06/04/2019  . Gross hematuria 06/04/2019  . COPD exacerbation ([place]) 11/05/2018  . Palpitations 10/22/2018  . Counseling regarding advance care planning and goals of care 06/22/2018  . AKI  (acute kidney injury) ([place])   . Anemia   . Sepsis ([place]) 06/05/2018  . Acute lower UTI 06/05/2018  . Colon polyp 05/21/2018  . Aortic valve regurgitation 04/14/2018  . Medication management 04/14/2018  . Thoracic aortic aneurysm without rupture ([place]) 09/29/2017  . 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 Head trauma, subsequent encounter 10/12/2016  . 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 Spinal stenosis in cervical region 10/12/2016  . Cervical spondylosis 10/12/2016  . CAD (coronary artery disease)   . Chronic diastolic CHF (congestive heart failure) ([place])   . Hyperlipidemia   . Hypertensive heart disease   . Paroxysmal atrial fibrillation ([place]) 12/17/2015  . 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 Mild intermittent asthma 10/18/2015  . Aortic root dilatation ([place]) 10/17/2015  . Diabetes ([place]) 10/17/2015  . 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 Hand paresthesia 12/09/2014  . Elevated prostate specific antigen (PSA) 07/12/2014  . Allergic rhinitis 07/12/2014  . LBP (low back pain) 07/12/2014  . Palpitation 07/11/2014  . Patient's other noncompliance with medication regimen 08/10/2013  . Chronic venous insufficiency 07/23/2012  . Obstructive apnea 04/11/2011  . Essential (primary) hypertension 09/07/2010  . 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 Anxiety, generalized 04/04/2010  . Cardiac conduction disorder 02/27/2010  . BPH (benign prostatic hyperplasia) 09/13/2009  . Abnormal findings on examination of genitourinary organs 08/09/2009  . Hereditary and idiopathic neuropathy 07/05/2009    Past Surgical History:  Procedure Laterality Date  . AV FISTULA PLACEMENT Left 01/26/2020   Procedure: left ARTERIOVENOUS (AV) FISTULA CREATION;  Surgeon: Waynetta Sandy, MD;  Location: [facility];  Service: Vascular;  Laterality: Left;  .  BACK SURGERY    . CARDIAC CATHETERIZATION N/A 12/18/2015   Procedure: Left Heart Cath and Coronary Angiography;  Surgeon: Leonie Man, MD;  Location: [facility];  Service: Cardiovascular;  Laterality: N/A;  . DIAGNOSTIC LAPAROSCOPY     partial colectomy  . IR FLUORO GUIDE CV LINE RIGHT  06/28/2019  . IR FLUORO GUIDE CV LINE RIGHT  01/24/2020  . IR REMOVAL TUN CV CATH W/O FL  07/26/2019  . IR US GUIDE VASC ACCESS RIGHT  06/28/2019  . IR US GUIDE VASC ACCESS RIGHT  01/24/2020  . LAPAROSCOPIC PARTIAL COLECTOMY Right 05/21/2018   Procedure: LAPAROSCOPIC RIGHT  COLECTOMY ERAS PATHWAY;  Surgeon: Leighton Ruff, MD;  Location: [facility];  Service: General;  Laterality: Right;  . [place]   "replaced a disc"       Family History  Problem Relation Age of Onset  . Emphysema Father   . Asthma Father   . Liver disease Father        tumor  .  Heart disease Mother   . Hypertension Mother   . Asthma Sister   . Hypertension Son     Social History   Tobacco Use  . Smoking status: Former Smoker    Types: Cigars    Quit date: 09/02/1977    Years since quitting: 42.6  . Smokeless tobacco: Never Used  . Tobacco comment:    Vaping Use  . Vaping Use: Never used  Substance Use Topics  . Alcohol use: Not Currently    Comment: "stopped drinking alcohol in ~ 1980; just drank a little on the weekends when I did drink"  . Drug use: No    Home Medications Prior to Admission medications   Medication Sig Start Date End Date Taking? Authorizing Provider  amLODipine (NORVASC) 10 MG tablet Take 1 tablet (10 mg total) by mouth daily. 01/12/20  Yes [name], DO  atorvastatin (LIPITOR) 40 MG tablet Take 40 mg by mouth daily as needed (pt states he only takes when his cholesterol is high).    Yes [provider]  AURYXIA 1 GM 210 MG(Fe) tablet Take 420 mg by mouth 3 (three) times daily. 03/23/20  Yes [provider]  cetirizine (ZYRTEC) 10 MG tablet  TAKE 1 TABLET (10 MG TOTAL) BY MOUTH DAILY AS NEEDED (SEASONAL ALLERGIES). Patient taking differently: Take 10 mg by mouth daily as needed for allergies (seasonal allergies).  11/12/19  Yes [name], DO  diclofenac Sodium (VOLTAREN) 1 % GEL Apply 2 g topically 4 (four) times daily. Patient taking differently: Apply 2 g topically 4 (four) times daily as needed (pain).  03/03/20  Yes Dagoberto Ligas, PA-C  HYDROcodone-acetaminophen (NORCO/VICODIN) 5-325 MG tablet Take 1-2 tablets by mouth every 6 (six) hours as needed (gout pain). 12/29/19  Yes Bast, Traci A, NP  ixazomib citrate (NINLARO) 3 MG capsule Take 1 capsule (3 mg) by mouth weekly, 3 weeks on, 1 week off, repeat every 4 weeks. Take on an empty stomach 1hr before or 2hr after meals. Patient taking differently: Take 3 mg by mouth See admin instructions. Take 1 capsule (3 mg) by mouth weekly, 3 weeks on, 1 week off, repeat every 4 weeks. Take on an empty stomach 1hr before or 2hr after meals. 03/10/20  Yes Brunetta Genera, MD  tamsulosin (FLOMAX) 0.4 MG CAPS capsule Take 1 capsule (0.4 mg total) by mouth daily. 10/15/19  Yes [name], DO  glucose blood (ONETOUCH ULTRA) test strip Check FSBS twice a day. Dx: E11.21, N18.32 Patient taking differently: 1 each by Other route See admin instructions. Check FSBS twice a day. Dx: E11.21, N18.32 08/16/19   Fulp, Ander Gaster, MD  oxyCODONE-acetaminophen (PERCOCET/ROXICET) 5-325 MG tablet Take 1 tablet by mouth every 6 (six) hours as needed for moderate pain or severe pain. Patient not taking: Reported on 04/20/2020 03/29/20   Brunetta Genera, MD    Allergies    Amoxicillin and Penicillins  Review of Systems   Review of Systems Ten systems are reviewed and are negative for acute change except as noted in the HPI  Physical Exam Updated Vital Signs BP (!) 146/89 (BP Location: Right Arm)   Pulse 70   Temp 98.1 F (36.7 C) (Oral)   Resp 13   SpO2 95%   Physical  Exam Constitutional:      General: He is not in acute distress.    Appearance: Normal appearance. He is well-developed. He is not ill-appearing or diaphoretic.  HENT:     Head: Normocephalic  and atraumatic.  Eyes:     General: Vision grossly intact. Gaze aligned appropriately.     Pupils: Pupils are equal, round, and reactive to light.  Neck:     Trachea: Trachea and phonation normal.  Cardiovascular:     Rate and Rhythm: Normal rate and regular rhythm.  Pulmonary:     Effort: Pulmonary effort is normal. No respiratory distress.  Abdominal:     General: There is no distension.     Palpations: Abdomen is soft.     Tenderness: There is no abdominal tenderness. There is no guarding or rebound.     Comments: Large ventral hernia  Musculoskeletal:        General: Normal range of motion.     Cervical back: Normal range of motion.     Right lower leg: 1+ Edema present.     Left lower leg: 1+ Edema present.  Skin:    General: Skin is warm and dry.  Neurological:     Mental Status: He is alert.     GCS: GCS eye subscore is 4. GCS verbal subscore is 5. GCS motor subscore is 6.     Comments: Speech is clear and goal oriented, follows commands Major Cranial nerves without deficit, no facial droop Moves extremities without ataxia, coordination intact  Psychiatric:        Behavior: Behavior normal.     ED Results / Procedures / Treatments   Labs (all labs ordered are listed, but only abnormal results are displayed) Labs Reviewed  BASIC METABOLIC PANEL - Abnormal; Notable for the following components:      Result Value   Sodium 133 (*)    Potassium 5.5 (*)    Chloride 92 (*)    CO2 21 (*)    Glucose, Bld 118 (*)    BUN 67 (*)    Creatinine, Ser 13.66 (*)    GFR calc non Af Amer 3 (*)    GFR calc Af Amer 4 (*)    Anion gap 20 (*)    All other components within normal limits  CBC - Abnormal; Notable for the following components:   RBC 2.60 (*)    Hemoglobin 8.0 (*)    HCT  25.9 (*)    RDW 20.8 (*)    All other components within normal limits  HEPATIC FUNCTION PANEL - Abnormal; Notable for the following components:   Albumin 3.3 (*)    Bilirubin, Direct 0.3 (*)    All other components within normal limits  I-STAT CHEM 8, ED - Abnormal; Notable for the following components:   Sodium 128 (*)    Potassium 5.5 (*)    Chloride 96 (*)    BUN 67 (*)    Creatinine, Ser 14.80 (*)    Glucose, Bld 115 (*)    Calcium, Ion 1.04 (*)    TCO2 20 (*)    Hemoglobin 7.8 (*)    HCT 23.0 (*)    All other components within normal limits  TROPONIN I (HIGH SENSITIVITY) - Abnormal; Notable for the following components:   Troponin I (High Sensitivity) 79 (*)    All other components within normal limits  SARS CORONAVIRUS 2 BY RT PCR (HOSPITAL ORDER, [facility])  LIPASE, BLOOD  URINALYSIS, ROUTINE W REFLEX MICROSCOPIC  TROPONIN I (HIGH SENSITIVITY)    EKG EKG Interpretation  Date/Time:  Tuesday April 25 2020 22:29:18 EDT Ventricular Rate:  70 PR Interval:    QRS Duration: 121 QT  Interval:  462 QTC Calculation: 499 R Axis:   -89 Text Interpretation: Sinus rhythm Multiple premature complexes, vent & supraven RBBB and LAFB Nonspecific T abnormalities, lateral leads No significant change since last tracing Confirmed by Calvert Cantor [phone]) on 04/25/2020 10:34:31 PM   Radiology DG Chest Portable 1 View  Result Date: 04/25/2020 CLINICAL DATA:  Shortness of breath EXAM: PORTABLE CHEST 1 VIEW COMPARISON:  04/20/2020 FINDINGS: Right dialysis catheter remains in place, unchanged. Marked cardiomegaly. No confluent opacities, effusions or edema. No acute bony abnormality. IMPRESSION: Stable cardiomegaly.  No active disease. Electronically Signed   By: Rolm Baptise M.D.   On: 04/25/2020 22:48    Procedures Procedures (including critical care time)  Medications Ordered in ED Medications  sodium zirconium cyclosilicate (LOKELMA) packet 5 g (5 g  Oral Given 04/26/20 0055)    ED Course  I have reviewed the triage vital signs and the nursing notes.  Pertinent labs & imaging results that were available during my care of the patient were reviewed by me and considered in my medical decision making (see chart for details).  Clinical Course as of Apr 27 143  Tue Apr 25, 2020  2344 Dr. Justin Mend   [BM]  Wed Apr 26, 2020  0129 Dr. Tonie Griffith   [BM]    Clinical Course User Index [BM] Gari Crown   MDM Rules/Calculators/A&P                          Additional history obtained from: 1. Nursing notes from this visit. 2. Review of electronic medical records.  Patient seen in the ED on 04/20/2020, received dialysis was discharged.  Diagnosis ESRD.  Covid test negative.  Lipase within normal limits, doubt pancreatitis.  LFTs show no acute elevations, doubt biliary obstruction.  High-sensitivity troponin elevated at 79 may be secondary to patient missing dialysis sessions, no chest pain, denies current shortness of breath, low suspicion for ACS, PE, dissection at this time, delta troponin ordered.  Suspect shortness of breath secondary to need for dialysis, no oxygen requirement at this time. BMP shows hyperkalemia of 5.5, kidney function appears similar to prior.  CBC shows baseline anemia of 8.0, no leukocytosis to suggest infection.  CXR:   IMPRESSION:  Stable cardiomegaly. No active disease.   EKG:  Sinus rhythm Multiple premature complexes, vent & supraven RBBB and LAFB Nonspecific T abnormalities, lateral leads No significant change since last tracing Confirmed by Calvert Cantor [phone]) on 04/25/2020 10:34:31 PM - Discussed case with Dr. Justin Mend, nephrology at 11:44 PM advised that patient will be on list for dialysis tomorrow after admitted. - Discussed case with Dr. Ann Held at 1:29 AM, patient accepted to hospitalist service.  Patient was reassessed multiple times during this visit resting comfortably no acute distress vital  signs stable on room air.  Denies any chest pain or shortness of breath.  He was started on [facility] for hyperkalemia.  He states understanding of need for admission and is agreeable to plan.  Patient seen and evaluated by Dr. Roxanne Mins during this visit who agrees with work-up and admission.  Note: Portions of this report may have been transcribed using voice recognition software. Every effort was made to ensure accuracy; however, inadvertent computerized transcription errors may still be present. Final Clinical Impression(s) / ED Diagnoses Final diagnoses:  None    Rx / DC Orders ED Discharge Orders    None       Gari Crown 04/26/20  8934    Delora Fuel, MD 06/84/03 [phone]

## 2020-04-25 NOTE — Telephone Encounter (Signed)
(  3:60P) SW completed follow-up call to patient's son-[name] [name] provide support as patient is currently in the emergency department. [name] [name] advised that patient fainted as he is not getting enough oxygen to his brain. Patient is being assessed and [name] [name] plans to take him home. SW advised [name] [name] that patient's PCS application was completed and forwarded to Dr. Irene Limbo for signature. SW advised that Palliative Care NP was also updated on patient status.  No other needs expressed by [name] [name]. SW to provide support and ongoing assessment of psychosocial needs/support.

## 2020-04-25 NOTE — Telephone Encounter (Signed)
Will fax paper Rx to [place].  Patient also called in to request repeat CBC & blood transfusion. Awaiting provider recommendations.

## 2020-04-25 NOTE — Telephone Encounter (Signed)
Paper Rx done.   Phill Myron, D.O. Primary Care at [facility]  04/25/2020, 10:01 AM

## 2020-04-25 NOTE — ED Triage Notes (Signed)
Pt arrives to ED with complaints of shortness of breath, nausea, vomiting over the past week. Due to this, patient has felt too weak to make it to dialysis so he had missed the last two sessions. Today pt has passed out x2 times today which brought him to ED.

## 2020-04-25 NOTE — ED Notes (Signed)
Pt was called multiple times with no answer, pt taken off the floor

## 2020-04-26 ENCOUNTER — Encounter (HOSPITAL_COMMUNITY): Payer: Self-pay | Admitting: Family Medicine

## 2020-04-26 ENCOUNTER — Telehealth: Payer: Self-pay

## 2020-04-26 DIAGNOSIS — I5032 Chronic diastolic (congestive) heart failure: Secondary | ICD-10-CM

## 2020-04-26 DIAGNOSIS — E875 Hyperkalemia: Secondary | ICD-10-CM | POA: Diagnosis present

## 2020-04-26 DIAGNOSIS — I361 Nonrheumatic tricuspid (valve) insufficiency: Secondary | ICD-10-CM | POA: Diagnosis not present

## 2020-04-26 DIAGNOSIS — N401 Enlarged prostate with lower urinary tract symptoms: Secondary | ICD-10-CM | POA: Diagnosis present

## 2020-04-26 DIAGNOSIS — I351 Nonrheumatic aortic (valve) insufficiency: Secondary | ICD-10-CM | POA: Diagnosis not present

## 2020-04-26 DIAGNOSIS — E871 Hypo-osmolality and hyponatremia: Secondary | ICD-10-CM | POA: Diagnosis present

## 2020-04-26 DIAGNOSIS — D631 Anemia in chronic kidney disease: Secondary | ICD-10-CM | POA: Diagnosis present

## 2020-04-26 DIAGNOSIS — I132 Hypertensive heart and chronic kidney disease with heart failure and with stage 5 chronic kidney disease, or end stage renal disease: Secondary | ICD-10-CM | POA: Diagnosis present

## 2020-04-26 DIAGNOSIS — Z880 Allergy status to penicillin: Secondary | ICD-10-CM | POA: Diagnosis not present

## 2020-04-26 DIAGNOSIS — R9431 Abnormal electrocardiogram [ECG] [EKG]: Secondary | ICD-10-CM

## 2020-04-26 DIAGNOSIS — I480 Paroxysmal atrial fibrillation: Secondary | ICD-10-CM | POA: Diagnosis present

## 2020-04-26 DIAGNOSIS — E8770 Fluid overload, unspecified: Secondary | ICD-10-CM | POA: Diagnosis present

## 2020-04-26 DIAGNOSIS — I2510 Atherosclerotic heart disease of native coronary artery without angina pectoris: Secondary | ICD-10-CM | POA: Diagnosis present

## 2020-04-26 DIAGNOSIS — C649 Malignant neoplasm of unspecified kidney, except renal pelvis: Secondary | ICD-10-CM | POA: Diagnosis present

## 2020-04-26 DIAGNOSIS — I10 Essential (primary) hypertension: Secondary | ICD-10-CM | POA: Diagnosis not present

## 2020-04-26 DIAGNOSIS — R569 Unspecified convulsions: Secondary | ICD-10-CM | POA: Diagnosis not present

## 2020-04-26 DIAGNOSIS — Z992 Dependence on renal dialysis: Secondary | ICD-10-CM | POA: Diagnosis not present

## 2020-04-26 DIAGNOSIS — Z9115 Patient's noncompliance with renal dialysis: Secondary | ICD-10-CM | POA: Diagnosis not present

## 2020-04-26 DIAGNOSIS — E1122 Type 2 diabetes mellitus with diabetic chronic kidney disease: Secondary | ICD-10-CM | POA: Diagnosis present

## 2020-04-26 DIAGNOSIS — E785 Hyperlipidemia, unspecified: Secondary | ICD-10-CM | POA: Diagnosis present

## 2020-04-26 DIAGNOSIS — I712 Thoracic aortic aneurysm, without rupture: Secondary | ICD-10-CM | POA: Diagnosis present

## 2020-04-26 DIAGNOSIS — R55 Syncope and collapse: Secondary | ICD-10-CM | POA: Diagnosis present

## 2020-04-26 DIAGNOSIS — I371 Nonrheumatic pulmonary valve insufficiency: Secondary | ICD-10-CM | POA: Diagnosis not present

## 2020-04-26 DIAGNOSIS — J449 Chronic obstructive pulmonary disease, unspecified: Secondary | ICD-10-CM | POA: Diagnosis present

## 2020-04-26 DIAGNOSIS — Z9119 Patient's noncompliance with other medical treatment and regimen: Secondary | ICD-10-CM | POA: Diagnosis not present

## 2020-04-26 DIAGNOSIS — N189 Chronic kidney disease, unspecified: Secondary | ICD-10-CM | POA: Diagnosis not present

## 2020-04-26 DIAGNOSIS — Z20822 Contact with and (suspected) exposure to covid-19: Secondary | ICD-10-CM | POA: Diagnosis present

## 2020-04-26 DIAGNOSIS — C9000 Multiple myeloma not having achieved remission: Secondary | ICD-10-CM | POA: Diagnosis present

## 2020-04-26 DIAGNOSIS — Z881 Allergy status to other antibiotic agents status: Secondary | ICD-10-CM | POA: Diagnosis not present

## 2020-04-26 DIAGNOSIS — N2581 Secondary hyperparathyroidism of renal origin: Secondary | ICD-10-CM | POA: Diagnosis present

## 2020-04-26 DIAGNOSIS — R531 Weakness: Secondary | ICD-10-CM

## 2020-04-26 DIAGNOSIS — N186 End stage renal disease: Secondary | ICD-10-CM | POA: Diagnosis present

## 2020-04-26 LAB — RENAL FUNCTION PANEL
Albumin: 2.8 g/dL — ABNORMAL LOW (ref 3.5–5.0)
Anion gap: 17 — ABNORMAL HIGH (ref 5–15)
BUN: 73 mg/dL — ABNORMAL HIGH (ref 8–23)
CO2: 23 mmol/L (ref 22–32)
Calcium: 10 mg/dL (ref 8.9–10.3)
Chloride: 92 mmol/L — ABNORMAL LOW (ref 98–111)
Creatinine, Ser: 14.17 mg/dL — ABNORMAL HIGH (ref 0.61–1.24)
GFR calc Af Amer: 3 mL/min — ABNORMAL LOW (ref >60)
GFR calc non Af Amer: 3 mL/min — ABNORMAL LOW (ref >60)
Glucose, Bld: 87 mg/dL (ref 70–99)
Phosphorus: 9.7 mg/dL — ABNORMAL HIGH (ref 2.5–4.6)
Potassium: 5.5 mmol/L — ABNORMAL HIGH (ref 3.5–5.1)
Sodium: 132 mmol/L — ABNORMAL LOW (ref 135–145)

## 2020-04-26 LAB — CBC
HCT: 22.4 % — ABNORMAL LOW (ref 39.0–52.0)
HCT: 24.3 % — ABNORMAL LOW (ref 39.0–52.0)
Hemoglobin: 7.2 g/dL — ABNORMAL LOW (ref 13.0–17.0)
Hemoglobin: 7.5 g/dL — ABNORMAL LOW (ref 13.0–17.0)
MCH: 31.9 pg (ref 26.0–34.0)
MCH: 31.4 pg (ref 26.0–34.0)
MCHC: 32.1 g/dL (ref 30.0–36.0)
MCHC: 30.9 g/dL (ref 30.0–36.0)
MCV: 99.1 fL (ref 80.0–100.0)
MCV: 101.7 fL — ABNORMAL HIGH (ref 80.0–100.0)
Platelets: 156 10*3/uL (ref 150–400)
Platelets: 174 10*3/uL (ref 150–400)
RBC: 2.26 MIL/uL — ABNORMAL LOW (ref 4.22–5.81)
RBC: 2.39 MIL/uL — ABNORMAL LOW (ref 4.22–5.81)
RDW: 21.5 % — ABNORMAL HIGH (ref 11.5–15.5)
RDW: 20.9 % — ABNORMAL HIGH (ref 11.5–15.5)
WBC: 5 10*3/uL (ref 4.0–10.5)
WBC: 6.5 10*3/uL (ref 4.0–10.5)
nRBC: 0.6 % — ABNORMAL HIGH (ref 0.0–0.2)
nRBC: 0.5 % — ABNORMAL HIGH (ref 0.0–0.2)

## 2020-04-26 LAB — TROPONIN I (HIGH SENSITIVITY)
Troponin I (High Sensitivity): 79 ng/L — ABNORMAL HIGH (ref <18)
Troponin I (High Sensitivity): 64 ng/L — ABNORMAL HIGH (ref <18)

## 2020-04-26 LAB — CBG MONITORING, ED
Glucose-Capillary: 91 mg/dL (ref 70–99)
Glucose-Capillary: 104 mg/dL — ABNORMAL HIGH (ref 70–99)

## 2020-04-26 LAB — HEPATIC FUNCTION PANEL
ALT: 14 U/L (ref 0–44)
AST: 21 U/L (ref 15–41)
Albumin: 3.3 g/dL — ABNORMAL LOW (ref 3.5–5.0)
Alkaline Phosphatase: 55 U/L (ref 38–126)
Bilirubin, Direct: 0.3 mg/dL — ABNORMAL HIGH (ref 0.0–0.2)
Indirect Bilirubin: 0.7 mg/dL (ref 0.3–0.9)
Total Bilirubin: 1 mg/dL (ref 0.3–1.2)
Total Protein: 6.6 g/dL (ref 6.5–8.1)

## 2020-04-26 LAB — LIPASE, BLOOD: Lipase: 29 U/L (ref 11–51)

## 2020-04-26 LAB — SARS CORONAVIRUS 2 BY RT PCR (HOSPITAL ORDER, PERFORMED IN ~~LOC~~ HOSPITAL LAB): SARS Coronavirus 2: NEGATIVE

## 2020-04-26 MED ORDER — HEPARIN SODIUM (PORCINE) 5000 UNIT/ML IJ SOLN
5000.0000 [IU] | Freq: Three times a day (TID) | INTRAMUSCULAR | Status: DC
  Administered 2020-04-26 – 2020-05-02 (×14): 5000 [IU] via SUBCUTANEOUS
  Filled 2020-04-26 (×17): qty 1

## 2020-04-26 MED ORDER — ONDANSETRON HCL 4 MG PO TABS: 4.0000 mg | ORAL_TABLET | Freq: Four times a day (QID) | ORAL | Status: DC | PRN

## 2020-04-26 MED ORDER — SODIUM CHLORIDE 0.9 % IV SOLN: 250.0000 mL | INTRAVENOUS | Status: DC | PRN

## 2020-04-26 MED ORDER — DARBEPOETIN ALFA 200 MCG/0.4ML IJ SOSY
200.0000 ug | PREFILLED_SYRINGE | INTRAMUSCULAR | Status: DC
  Administered 2020-04-27: 200 ug via INTRAVENOUS

## 2020-04-26 MED ORDER — SODIUM CHLORIDE 0.9% FLUSH: 3.0000 mL | INTRAVENOUS | Status: DC | PRN

## 2020-04-26 MED ORDER — FERRIC CITRATE 1 GM 210 MG(FE) PO TABS
420.0000 mg | ORAL_TABLET | Freq: Three times a day (TID) | ORAL | Status: DC
  Administered 2020-04-26 – 2020-05-02 (×8): 420 mg via ORAL
  Filled 2020-04-26 (×17): qty 2

## 2020-04-26 MED ORDER — ACETAMINOPHEN 650 MG RE SUPP: 650.0000 mg | Freq: Four times a day (QID) | RECTAL | Status: DC | PRN

## 2020-04-26 MED ORDER — TAMSULOSIN HCL 0.4 MG PO CAPS
0.4000 mg | ORAL_CAPSULE | Freq: Every day | ORAL | Status: DC
  Administered 2020-04-26 – 2020-05-02 (×4): 0.4 mg via ORAL
  Filled 2020-04-26 (×7): qty 1

## 2020-04-26 MED ORDER — HEPARIN SODIUM (PORCINE) 1000 UNIT/ML DIALYSIS
2000.0000 [IU] | INTRAMUSCULAR | Status: DC | PRN
  Filled 2020-04-26: qty 2

## 2020-04-26 MED ORDER — HYDROCODONE-ACETAMINOPHEN 5-325 MG PO TABS
1.0000 | ORAL_TABLET | Freq: Four times a day (QID) | ORAL | Status: DC | PRN
  Administered 2020-04-26: 1 via ORAL
  Administered 2020-04-28: 2 via ORAL
  Administered 2020-04-28 – 2020-04-29 (×2): 1 via ORAL
  Administered 2020-04-29 – 2020-04-30 (×2): 2 via ORAL
  Administered 2020-05-01 (×2): 1 via ORAL
  Administered 2020-05-02 (×2): 2 via ORAL
  Filled 2020-04-26: qty 1
  Filled 2020-04-26: qty 2
  Filled 2020-04-26: qty 1
  Filled 2020-04-26: qty 2
  Filled 2020-04-26: qty 1
  Filled 2020-04-26 (×3): qty 2
  Filled 2020-04-26: qty 1
  Filled 2020-04-26: qty 2
  Filled 2020-04-26: qty 1
  Filled 2020-04-26: qty 2
  Filled 2020-04-26: qty 1

## 2020-04-26 MED ORDER — ACETAMINOPHEN 325 MG PO TABS
650.0000 mg | ORAL_TABLET | Freq: Four times a day (QID) | ORAL | Status: DC | PRN
  Administered 2020-04-26 – 2020-05-02 (×5): 650 mg via ORAL
  Filled 2020-04-26 (×3): qty 2

## 2020-04-26 MED ORDER — AMLODIPINE BESYLATE 10 MG PO TABS
10.0000 mg | ORAL_TABLET | Freq: Every day | ORAL | Status: DC
  Filled 2020-04-26: qty 1
  Filled 2020-04-26: qty 2
  Filled 2020-04-26 (×4): qty 1

## 2020-04-26 MED ORDER — ONDANSETRON HCL 4 MG/2ML IJ SOLN: 4.0000 mg | Freq: Four times a day (QID) | INTRAMUSCULAR | Status: DC | PRN

## 2020-04-26 MED ORDER — SODIUM CHLORIDE 0.9% FLUSH
3.0000 mL | Freq: Two times a day (BID) | INTRAVENOUS | Status: DC
  Administered 2020-04-26 – 2020-05-01 (×10): 3 mL via INTRAVENOUS

## 2020-04-26 MED ORDER — SENNOSIDES-DOCUSATE SODIUM 8.6-50 MG PO TABS
1.0000 | ORAL_TABLET | Freq: Every evening | ORAL | Status: DC | PRN
  Administered 2020-05-02 (×2): 1 via ORAL
  Filled 2020-04-26 (×2): qty 1

## 2020-04-26 MED ORDER — ATORVASTATIN CALCIUM 40 MG PO TABS: 40.0000 mg | ORAL_TABLET | Freq: Every day | ORAL | Status: DC | PRN

## 2020-04-26 MED ORDER — HEPARIN SODIUM (PORCINE) 1000 UNIT/ML IJ SOLN
INTRAMUSCULAR | Status: AC
  Filled 2020-04-26: qty 4

## 2020-04-26 MED ORDER — SODIUM CHLORIDE 0.9% FLUSH
3.0000 mL | Freq: Two times a day (BID) | INTRAVENOUS | Status: DC
  Administered 2020-04-27 – 2020-05-01 (×3): 3 mL via INTRAVENOUS

## 2020-04-26 NOTE — Discharge Planning (Signed)
RNCM returned call to pt son, [name] [name].  [name] [name] concerned for pt having multiple syncopal episodes while ambulating around the house and felt skilled nursing facility would be a better living situation for his dad.  RNCM educated son, [name] [name] on process of his dad qualifying for skilled nursing facility placement.  RNCM informed [name] [name] that a Physical Therapy evaluation will need to be completed with a recommendation for SNF.  [name] [name] verbalized understanding.  TOC will update son with PT recommendations and develop safe disposition plan.

## 2020-04-26 NOTE — Progress Notes (Signed)
Renal Navigator received call from HD unit manager requesting follow up with family regarding transportation assistance for patient's OP HD at discharge. Navigator recalls discussing eligibility of Medicaid Transportation when patient was initially set up for OP HD in May 2021 and called patient's son to review. [name] states his dad has refused Medicaid Transportation previously, but thinks, since he has declined so significantly recently, that he is willing to use this service now. He took the number from Navigator again and was very Patent attorney.  Navigator notes documentation regarding Palliative vs Hospice care in patient's chart recently and asked patient's son if he feels this has been addressed. [name] told Navigator that patient's Oncologist has recommended [facility], and that his father has gone back and forth on this decision.  Navigator thinks an inpatient PMT consult may be beneficial to help with code status and goals of care. Patient also states that his father will never explain what is truly going on because he always just wants to get out of the hospital. Navigator suggests asking to be contacted by Primary MD to give collateral information. [name] agreeable and appreciative.  [name] [name], [name] [name] Renal Navigator [phone]

## 2020-04-26 NOTE — Consult Note (Signed)
[facility] Renal Consultation Note  Indication for Consultation:  Management of ESRD/hemodialysis; anemia, hypertension/volume and secondary hyperparathyroidism  HPI: [name] [name] is a 74 y.o. male with ESRD(HD TTS) history of noncompliance with HD treatments, diabetes mellitus type 2, ascending aortic aneurysm, CAD D, COPD, hypertension, anemia ESRD, atrial fib/history of light chain myeloma Now admitted with generalized weakness, nausea vomiting, syncope, shortness of breath history given by son and patient past 2 weeks having difficulty keeping food down, noted last hemodialysis session was 04/11/2020, this missing his last 6 treatments.  Denies any fever chills headache abdominal pain abdominal extremity swelling EKG in ER showed no acute changes elevated troponin consistent with ESRD patient volume overload BUN is 67 creatinine 14.8, potassium 5.5 CO2 21 calcium 10.2 NA 128, hemoglobin 8.0, WBC 8.0, PLT 193 Chest x-ray shows stable cardiomegaly and no active disease laid in the ER 80.8 kg questionable standing weight his dry weight is 87 kg  We are consulted for HD and ESRD issues plan for dialysis today     Past Medical History:  Diagnosis Date  . Abnormal CT of liver    a. nodular contour suggesting cirrhosis 05/2018.  [name] Abnormal LFTs (liver function tests)   . Anemia   . Anxiety   . Aortic root dilatation ([place])   . Ascending aortic aneurysm ([place])   . BPH with urinary obstruction   . Bradycardia   . C5-C7 level with spinal cord injury with central cord syndrome, without evidence of spinal bone injury ([place]) 10/12/2016  . CAD (coronary artery disease)    a. 12/2015 NSTEMI/Cath (in setting of PAF):  LM nl, LAD 30p,  LCX 34m, RCA  ok, AM 100%, RPDA1 40, RPDA 2 60 ->Med Rx.  . Childhood asthma   . Chronic diastolic CHF (congestive heart failure) ([place])    a. 12/2015 Echo: EF 50-55%, mod AI, mod Ao root dil, mild MR, mod dil LA, mod RA.  . CKD (chronic kidney disease), stage  III   . Colon cancer ([place])   . COPD (chronic obstructive pulmonary disease) ([place])    pt denies at preop  . Diabetes mellitus type II    diet controlled  . Dysrhythmia   . History of syncope   . Hyperlipidemia   . Hypertension   . Hypertensive heart disease   . Kidney lump 04/04/2010   Overview:  Renal Cell Carcinoma   . 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 Moderate aortic insufficiency    a. 12/2015 Echo: Mod AI.  [name] Paroxysmal atrial fibrillation ([place])    a. 12/2015 started on Xarelto (CHA2DS2VASc = 4-5).  . Peripheral vascular disease ([place])   . Pneumonia   . Prostate cancer ([place])   . Renal cell carcinoma ([place])   . Sleep apnea    Does not like  CPAP  . Spinal stenosis in cervical region 10/12/2016  . Venous insufficiency     Past Surgical History:  Procedure Laterality Date  . AV FISTULA PLACEMENT Left 01/26/2020   Procedure: left ARTERIOVENOUS (AV) FISTULA CREATION;  Surgeon: Waynetta Sandy, MD;  Location: [facility];  Service: Vascular;  Laterality: Left;  . BACK SURGERY    . CARDIAC CATHETERIZATION N/A 12/18/2015   Procedure: Left Heart Cath and Coronary Angiography;  Surgeon: Leonie Man, MD;  Location: [facility];  Service: Cardiovascular;  Laterality: N/A;  . DIAGNOSTIC LAPAROSCOPY     partial colectomy  . IR FLUORO GUIDE CV LINE RIGHT  06/28/2019  .  IR FLUORO GUIDE CV LINE RIGHT  01/24/2020  . IR REMOVAL TUN CV CATH W/O FL  07/26/2019  . IR US GUIDE VASC ACCESS RIGHT  06/28/2019  . IR US GUIDE VASC ACCESS RIGHT  01/24/2020  . LAPAROSCOPIC PARTIAL COLECTOMY Right 05/21/2018   Procedure: LAPAROSCOPIC RIGHT  COLECTOMY ERAS PATHWAY;  Surgeon: Leighton Ruff, MD;  Location: [facility];  Service: General;  Laterality: Right;  . [place]   "replaced a disc"      Family History  Problem Relation Age of Onset  . Emphysema Father   . Asthma Father   . Liver disease Father        tumor  . Heart disease Mother   . Hypertension Mother   . Asthma Sister    . Hypertension Son       reports that he quit smoking about 42 years ago. His smoking use included cigars. He has never used smokeless tobacco. He reports previous alcohol use. He reports that he does not use drugs.   Allergies  Allergen Reactions  . Amoxicillin Other (See Comments)    Tolerates Unasyn. Can't move Has patient had a PCN reaction causing immediate rash, facial/tongue/throat swelling, SOB or lightheadedness with hypotension: No Has patient had a PCN reaction causing severe rash involving mucus membranes or skin necrosis: No Has patient had a PCN reaction that required hospitalization No Has patient had a PCN reaction occurring within the last 10 years: No If all of the above answers are "NO", then may proceed with Cephalosporin use.   [name] Penicillins Other (See Comments)    Tolerates Unasyn. Can't move/dizziness Has patient had a PCN reaction causing immediate rash, facial/tongue/throat swelling, SOB or lightheadedness with hypotension: No Has patient had a PCN reaction causing severe rash involving mucus membranes or skin necrosis: No Has patient had a PCN reaction that required hospitalization No Has patient had a PCN reaction occurring within the last 10 years: No If all of the above answers are "NO", then may proceed with Cephalosporin use.      Prior to Admission medications   Medication Sig Start Date End Date Taking? Authorizing Provider  amLODipine (NORVASC) 10 MG tablet Take 1 tablet (10 mg total) by mouth daily. 01/12/20  Yes [name], DO  atorvastatin (LIPITOR) 40 MG tablet Take 40 mg by mouth daily as needed (pt states he only takes when his cholesterol is high).    Yes [provider]  AURYXIA 1 GM 210 MG(Fe) tablet Take 420 mg by mouth 3 (three) times daily. 03/23/20  Yes [provider]  cetirizine (ZYRTEC) 10 MG tablet TAKE 1 TABLET (10 MG TOTAL) BY MOUTH DAILY AS NEEDED (SEASONAL ALLERGIES). Patient taking differently:  Take 10 mg by mouth daily as needed for allergies (seasonal allergies).  11/12/19  Yes [name], DO  diclofenac Sodium (VOLTAREN) 1 % GEL Apply 2 g topically 4 (four) times daily. Patient taking differently: Apply 2 g topically 4 (four) times daily as needed (pain).  03/03/20  Yes Dagoberto Ligas, PA-C  HYDROcodone-acetaminophen (NORCO/VICODIN) 5-325 MG tablet Take 1-2 tablets by mouth every 6 (six) hours as needed (gout pain). 12/29/19  Yes Bast, Traci A, NP  ixazomib citrate (NINLARO) 3 MG capsule Take 1 capsule (3 mg) by mouth weekly, 3 weeks on, 1 week off, repeat every 4 weeks. Take on an empty stomach 1hr before or 2hr after meals. Patient taking differently: Take 3 mg by mouth See admin instructions. Take 1  capsule (3 mg) by mouth weekly, 3 weeks on, 1 week off, repeat every 4 weeks. Take on an empty stomach 1hr before or 2hr after meals. 03/10/20  Yes Brunetta Genera, MD  tamsulosin (FLOMAX) 0.4 MG CAPS capsule Take 1 capsule (0.4 mg total) by mouth daily. 10/15/19  Yes [name], DO  glucose blood (ONETOUCH ULTRA) test strip Check FSBS twice a day. Dx: E11.21, N18.32 Patient taking differently: 1 each by Other route See admin instructions. Check FSBS twice a day. Dx: E11.21, N18.32 08/16/19   Fulp, Ander Gaster, MD  oxyCODONE-acetaminophen (PERCOCET/ROXICET) 5-325 MG tablet Take 1 tablet by mouth every 6 (six) hours as needed for moderate pain or severe pain. Patient not taking: Reported on 04/20/2020 03/29/20   Brunetta Genera, MD    NAT:FTDDUK chloride, acetaminophen **OR** acetaminophen, atorvastatin, heparin, HYDROcodone-acetaminophen, ondansetron **OR** ondansetron (ZOFRAN) IV, senna-docusate, sodium chloride flush  Results for orders placed or performed during the hospital encounter of 04/25/20 (from the past 48 hour(s))  Basic metabolic panel     Status: Abnormal   Collection Time: 04/25/20  2:29 PM  Result Value Ref Range   Sodium 133 (L) 135 - 145  mmol/L   Potassium 5.5 (H) 3.5 - 5.1 mmol/L   Chloride 92 (L) 98 - 111 mmol/L   CO2 21 (L) 22 - 32 mmol/L   Glucose, Bld 118 (H) 70 - 99 mg/dL    Comment: Glucose reference range applies only to samples taken after fasting for at least 8 hours.   BUN 67 (H) 8 - 23 mg/dL   Creatinine, Ser 13.66 (H) 0.61 - 1.24 mg/dL   Calcium 10.2 8.9 - 10.3 mg/dL   GFR calc non Af Amer 3 (L) >60 mL/min   GFR calc Af Amer 4 (L) >60 mL/min   Anion gap 20 (H) 5 - 15    Comment: Performed at [facility] [address] [place], [place] [address]  CBC     Status: Abnormal   Collection Time: 04/25/20  2:29 PM  Result Value Ref Range   WBC 8.0 4.0 - 10.5 K/uL   RBC 2.60 (L) 4.22 - 5.81 MIL/uL   Hemoglobin 8.0 (L) 13.0 - 17.0 g/dL   HCT 25.9 (L) 39 - 52 %   MCV 99.6 80.0 - 100.0 fL   MCH 30.8 26.0 - 34.0 pg   MCHC 30.9 30.0 - 36.0 g/dL   RDW 20.8 (H) 11.5 - 15.5 %   Platelets 193 150 - 400 K/uL   nRBC 0.0 0.0 - 0.2 %    Comment: Performed at [facility] [address], [place], [place] [address]  I-stat chem 8, ED (not at [facility] or [facility])     Status: Abnormal   Collection Time: 04/25/20  2:38 PM  Result Value Ref Range   Sodium 128 (L) 135 - 145 mmol/L   Potassium 5.5 (H) 3.5 - 5.1 mmol/L   Chloride 96 (L) 98 - 111 mmol/L   BUN 67 (H) 8 - 23 mg/dL   Creatinine, Ser 14.80 (H) 0.61 - 1.24 mg/dL   Glucose, Bld 115 (H) 70 - 99 mg/dL    Comment: Glucose reference range applies only to samples taken after fasting for at least 8 hours.   Calcium, Ion 1.04 (L) 1.15 - 1.40 mmol/L   TCO2 20 (L) 22 - 32 mmol/L   Hemoglobin 7.8 (L) 13.0 - 17.0 g/dL   HCT 23.0 (L) 39 - 52 %  Troponin I (  High Sensitivity)     Status: Abnormal   Collection Time: 04/25/20 11:36 PM  Result Value Ref Range   Troponin I (High Sensitivity) 79 (H) <18 ng/L    Comment: (NOTE) Elevated high sensitivity troponin I (hsTnI) values and significant  changes across serial measurements may suggest ACS but many other  chronic  and acute conditions are known to elevate hsTnI results.  Refer to the "Links" section for chest pain algorithms and additional  guidance. Performed at [facility], [place] [address], [place], [place] [address]   Hepatic function panel     Status: Abnormal   Collection Time: 04/25/20 11:36 PM  Result Value Ref Range   Total Protein 6.6 6.5 - 8.1 g/dL   Albumin 3.3 (L) 3.5 - 5.0 g/dL   AST 21 15 - 41 U/L   ALT 14 0 - 44 U/L   Alkaline Phosphatase 55 38 - 126 U/L   Total Bilirubin 1.0 0.3 - 1.2 mg/dL   Bilirubin, Direct 0.3 (H) 0.0 - 0.2 mg/dL   Indirect Bilirubin 0.7 0.3 - 0.9 mg/dL    Comment: Performed at [facility] [address] [place], [place] [address]  Lipase, blood     Status: None   Collection Time: 04/25/20 11:36 PM  Result Value Ref Range   Lipase 29 11 - 51 U/L    Comment: Performed at [facility], [place] [address], [place], [place] [address]  SARS Coronavirus 2 by RT PCR (hospital order, performed in [facility] hospital lab) Nasopharyngeal Nasopharyngeal Swab     Status: None   Collection Time: 04/26/20 12:00 AM   Specimen: Nasopharyngeal Swab  Result Value Ref Range   SARS Coronavirus 2 NEGATIVE NEGATIVE    Comment: (NOTE) SARS-CoV-2 target nucleic acids are NOT DETECTED.  The SARS-CoV-2 RNA is generally detectable in upper and lower respiratory specimens during the acute phase of infection. The lowest concentration of SARS-CoV-2 viral copies this assay can detect is 250 copies / mL. A negative result does not preclude SARS-CoV-2 infection and should not be used as the sole basis for treatment or other patient management decisions.  A negative result may occur with improper specimen collection / handling, submission of specimen other than nasopharyngeal swab, presence of viral mutation(s) within the areas targeted by this assay, and inadequate number of viral copies (<250 copies / mL). A negative result must be combined with  clinical observations, patient history, and epidemiological information.  Fact Sheet for Patients:   StrictlyIdeas.no  Fact Sheet for Healthcare Providers: BankingDealers.co.za  This test is not yet approved or  cleared by the [place] FDA and has been authorized for detection and/or diagnosis of SARS-CoV-2 by FDA under an Emergency Use Authorization (EUA).  This EUA will remain in effect (meaning this test can be used) for the duration of the COVID-19 declaration under Section 564(b)(1) of the Act, 21 U.S.C. section 360bbb-3(b)(1), unless the authorization is terminated or revoked sooner.  Performed at [facility], [place] [address], [place], [place] [address]   CBG monitoring, ED     Status: None   Collection Time: 04/26/20  5:41 AM  Result Value Ref Range   Glucose-Capillary 91 70 - 99 mg/dL    Comment: Glucose reference range applies only to samples taken after fasting for at least 8 hours.  Troponin I (High Sensitivity)     Status: Abnormal   Collection Time: 04/26/20  5:43 AM  Result Value Ref Range  Troponin I (High Sensitivity) 64 (H) <18 ng/L    Comment: (NOTE) Elevated high sensitivity troponin I (hsTnI) values and significant  changes across serial measurements may suggest ACS but many other  chronic and acute conditions are known to elevate hsTnI results.  Refer to the "Links" section for chest pain algorithms and additional  guidance. Performed at [facility], [place] [address] [place], [place] [address]   CBC     Status: Abnormal   Collection Time: 04/26/20  5:43 AM  Result Value Ref Range   WBC 5.0 4.0 - 10.5 K/uL   RBC 2.26 (L) 4.22 - 5.81 MIL/uL   Hemoglobin 7.2 (L) 13.0 - 17.0 g/dL   HCT 22.4 (L) 39 - 52 %   MCV 99.1 80.0 - 100.0 fL   MCH 31.9 26.0 - 34.0 pg   MCHC 32.1 30.0 - 36.0 g/dL   RDW 21.5 (H) 11.5 - 15.5 %   Platelets 156 150 - 400 K/uL   nRBC 0.6 (H) 0.0 - 0.2 %    Comment:  Performed at [facility], [place] [address], [place], [place] [address]  CBC     Status: Abnormal   Collection Time: 04/26/20 10:04 AM  Result Value Ref Range   WBC 6.5 4.0 - 10.5 K/uL   RBC 2.39 (L) 4.22 - 5.81 MIL/uL   Hemoglobin 7.5 (L) 13.0 - 17.0 g/dL   HCT 24.3 (L) 39 - 52 %   MCV 101.7 (H) 80.0 - 100.0 fL   MCH 31.4 26.0 - 34.0 pg   MCHC 30.9 30.0 - 36.0 g/dL   RDW 20.9 (H) 11.5 - 15.5 %   Platelets 174 150 - 400 K/uL   nRBC 0.5 (H) 0.0 - 0.2 %    Comment: Performed at [facility] [address], [place], [place] [address]  Renal function panel     Status: Abnormal   Collection Time: 04/26/20 10:05 AM  Result Value Ref Range   Sodium 132 (L) 135 - 145 mmol/L   Potassium 5.5 (H) 3.5 - 5.1 mmol/L   Chloride 92 (L) 98 - 111 mmol/L   CO2 23 22 - 32 mmol/L   Glucose, Bld 87 70 - 99 mg/dL    Comment: Glucose reference range applies only to samples taken after fasting for at least 8 hours.   BUN 73 (H) 8 - 23 mg/dL   Creatinine, Ser 14.17 (H) 0.61 - 1.24 mg/dL   Calcium 10.0 8.9 - 10.3 mg/dL   Phosphorus 9.7 (H) 2.5 - 4.6 mg/dL   Albumin 2.8 (L) 3.5 - 5.0 g/dL   GFR calc non Af Amer 3 (L) >60 mL/min   GFR calc Af Amer 3 (L) >60 mL/min   Anion gap 17 (H) 5 - 15    Comment: Performed at [facility] [address], [place], [place] [address]  CBG monitoring, ED     Status: Abnormal   Collection Time: 04/26/20  1:48 PM  Result Value Ref Range   Glucose-Capillary 104 (H) 70 - 99 mg/dL    Comment: Glucose reference range applies only to samples taken after fasting for at least 8 hours.   .  ROS: See HPI Physical Exam: Vitals:   04/26/20 1359 04/26/20 1545  BP: (!) 142/102 131/86  Pulse: (!) 113 78  Resp:  12  Temp:    SpO2: 99% 97%     General: Chronically ill thin elderly male NAD on hemodialysis HEENT: ,  atraumatic, nonicteric, MMM are dry, Neck: No JVD supple Heart: RRR, no murmur rub or gallop appreciated Lungs: CTA anteriorly nonlabored  breathing Abdomen: Bowel sounds normoactive, NT N D, no ascites appreciated Extremities: Trace pedal edema Skin: No overt rash or ulcers appreciated Neuro: Normal speech, moves all extremities acute focal deficits appreciated Dialysis Access: Right IJ PermCath but no hemodialysis, his left upper arm AV fistula developing  OP dialysis Orders: Center: [facility] on TTS, 4 hours 15 minutes, 87 kg EDW, 2K 2 calcium bath, UF profile 4, no heparin, Mircera 200 MCG last given 03/30/2020 no vitamin D on  Assessment/Plan 1. 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 ESRD -HD TTS normal schedule, HD today and tomorrow keep on schedule 3. Hyponatremia-consistent with his missing hemodialysis follow-up trend after dialysis 4. Hypertension/volume  -no significant volume overload by exam admit weight in the ER 80 with EDW 87, does need standing weights if able prepost hemo, on home Norvasc monitor blood pressure closely may not need if controlled with hemo- 5. Anemia of ESRD-Hgb 7.8 has been missing his Mircera at outpatient kidney center because of noncompliance hemodialysis give 200 Aranesp  follow-up trend,  iron studies also 6. Metabolic bone disease -phosphorus 9.7 elevated with missing hemodialysis and compliance with binders continue binders with meals no outpatient vitamin D needs PTH level drawn while in hospital 7. DM type II-Per admit 8. Nutrition -ALB 2.8 consistent with missing hemo uremia renal carb modified diet and Rena-Vite use Nepro supplement also  Ernest Haber, PA-C [facility] [phone] 04/26/2020, 3:56 PM

## 2020-04-26 NOTE — Progress Notes (Signed)
PT Cancellation Note  Patient Details Name: MICA RELEFORD MRN: [id number] DOB: 1946-03-11   Cancelled Treatment:    Reason Eval/Treat Not Completed: Other (comment) Pt currently off the floor according to chart. Will follow up as schedule allows.   [name], DPT  Acute Rehabilitation Services  Pager: [phone] Office: [phone]    [name] 04/26/2020, 11:53 AM

## 2020-04-26 NOTE — H&P (Signed)
History and Physical    [name] [name] LJQ:[id number] DOB: 1946-03-20 DOA: 04/25/2020  PCP: [name], DO   Patient coming from: Home  Chief Complaint: Denies weakness, syncope  HPI: [name] [name] is a 74 y.o. male with medical history significant for ESRD, ascending aortic aneurysm, CAD,  COPD, hyperlipidemia, hypertension, multiple myeloma, paroxysmal A. fib, chronic anemia.  Reports he has been taking all of his medications as prescribed. Patient presents today with short of breath, generalized weakness, nausea vomiting and syncope.  Patient reports the symptoms are secondary to anemia and he thinks that he needs a blood transfusion.  Patient reports over the past 2 weeks he has had difficulty keeping down his food he has had multiple episodes of nonbloody/nonbilious emesis.  He reports over the last 1 week he has developed generalized fatigue and weakness without focal weakness.  He reports that he missed 2 dialysis sessions due to his fatigue, last dialysis session was on Friday, August 20.  He reports that over the last 1 week he has also developed shortness of breath and difficulty getting around his home.  He reports that he passed out 2-3 times today when he was attempting to get off of his recliner to turn off the TV, he reports his son had to turn him on his side to help the blood get back to his head.  He did not have any head trauma during the event.  He reports that when he regained consciousness he had no confusion and knew where he was, he was not who his son was.  States he did not have any slurred speech or vision change.  He denies any chest pain or palpitations prior to the syncopal events.  Reports he has had syncope like this before when he was anemic and needed blood transfusion Patient denies any fever/chills, headache/vision changes, numbness/tingling, focal weakness, abdominal pain, abnormal extremity swelling.  He has not had any recent travel.  He reports he had  COVID-19 infection a few months ago.   ED Course:   Emergency room patient found to have an elevated troponin.  He has no acute changes on his EKG.  With his syncope will need to rule out a cardiac etiology for it.  Review of Systems:  General: Reports generalized weakness.  Denies fever, chills, weight loss, night sweats.  Denies dizziness.  Denies change in appetite HENT: Denies head trauma, headache, denies change in hearing, tinnitus.  Denies nasal congestion or bleeding.  Denies sore throat, sores in mouth.  Denies difficulty swallowing Eyes: Denies blurry vision, pain in eye, drainage.  Denies discoloration of eyes. Neck: Denies pain.  Denies swelling.  Denies pain with movement. Cardiovascular: Denies chest pain, palpitations.  Denies edema.  Denies orthopnea Respiratory: Denies shortness of breath, cough.  Denies wheezing.  Denies sputum production Gastrointestinal: Denies abdominal pain, swelling.  Reports mild nausea and one episode vomiting.denies diarrhea.  Denies melena.  Denies hematemesis. Musculoskeletal: Denies limitation of movement.  Denies deformity or swelling.  Denies pain.  Denies arthralgias or myalgias. Genitourinary: Denies pelvic pain.  Denies urinary frequency or hesitancy.  Denies dysuria.  Skin: Denies rash.  Denies petechiae, purpura, ecchymosis. Neurological: Reports syncopal episodes. Denies headache. Denies seizure activity.  Denies paresthesia.  Denies slurred speech, drooping face.  Denies visual change. Psychiatric: Denies depression, anxiety.  Denies suicidal thoughts or ideation.  Denies hallucinations.  Past Medical History:  Diagnosis Date  . Abnormal CT of liver    a. nodular contour suggesting  cirrhosis 05/2018.  [name] Abnormal LFTs (liver function tests)   . Anemia   . Anxiety   . Aortic root dilatation ([place])   . Ascending aortic aneurysm ([name])   . BPH with urinary obstruction   . Bradycardia   . C5-C7 level with spinal cord injury with central cord  syndrome, without evidence of spinal bone injury ([place]) 10/12/2016  . CAD (coronary artery disease)    a. 12/2015 NSTEMI/Cath (in setting of PAF):  LM nl, LAD 30p,  LCX 33m RCA  ok, AM 100%, RPDA1 40, RPDA 2 60 ->Med Rx.  . Childhood asthma   . Chronic diastolic CHF (congestive heart failure) ([place]    a. 12/2015 Echo: EF 50-55%, mod AI, mod Ao root dil, mild MR, mod dil LA, mod RA.  . CKD (chronic kidney disease), stage III   . Colon cancer ([place]   . COPD (chronic obstructive pulmonary disease) ([facility]    pt denies at preop  . Diabetes mellitus type II    diet controlled  . Dysrhythmia   . History of syncope   . Hyperlipidemia   . Hypertension   . Hypertensive heart disease   . Kidney lump 04/04/2010   Overview:  Renal Cell Carcinoma   . Light chain myeloma ([place]   . Moderate aortic insufficiency    a. 12/2015 Echo: Mod AI.  .Marland KitchenParoxysmal atrial fibrillation ([place]    a. 12/2015 started on Xarelto (CHA2DS2VASc = 4-5).  . Peripheral vascular disease ([place]   . Pneumonia   . Prostate cancer ([place]   . Renal cell carcinoma ([place]   . Sleep apnea    Does not like  CPAP  . Spinal stenosis in cervical region 10/12/2016  . Venous insufficiency     Past Surgical History:  Procedure Laterality Date  . AV FISTULA PLACEMENT Left 01/26/2020   Procedure: left ARTERIOVENOUS (AV) FISTULA CREATION;  Surgeon: CWaynetta Sandy MD;  [facility]  Service: Vascular;  Laterality: Left;  . BACK SURGERY    . CARDIAC CATHETERIZATION N/A 12/18/2015   Procedure: Left Heart Cath and Coronary Angiography;  Surgeon: DLeonie Man MD;  [facility];  Service: Cardiovascular;  Laterality: N/A;  . DIAGNOSTIC LAPAROSCOPY     partial colectomy  . IR FLUORO GUIDE CV LINE RIGHT  06/28/2019  . IR FLUORO GUIDE CV LINE RIGHT  01/24/2020  . IR REMOVAL TUN CV CATH W/O FL  07/26/2019  . IR [place] ACCESS RIGHT  06/28/2019  . IR [place] VASC ACCESS RIGHT  01/24/2020  . LAPAROSCOPIC PARTIAL  COLECTOMY Right 05/21/2018   Procedure: LAPAROSCOPIC RIGHT  COLECTOMY ERAS PATHWAY;  Surgeon: TLeighton Ruff MD;  Location: [facility];  Service: General;  Laterality: Right;  . [place]  "replaced a disc"    Social History  reports that he quit smoking about 42 years ago. His smoking use included cigars. He has never used smokeless tobacco. He reports previous alcohol use. He reports that he does not use drugs.  Allergies  Allergen Reactions  . Amoxicillin Other (See Comments)    Tolerates Unasyn. Can't move Has patient had a PCN reaction causing immediate rash, facial/tongue/throat swelling, SOB or lightheadedness with hypotension: No Has patient had a PCN reaction causing severe rash involving mucus membranes or skin necrosis: No Has patient had a PCN reaction that required hospitalization No Has patient had a PCN reaction occurring within the last 10 years: No If all of  the above answers are "NO", then may proceed with Cephalosporin use.   [name] Penicillins Other (See Comments)    Tolerates Unasyn. Can't move/dizziness Has patient had a PCN reaction causing immediate rash, facial/tongue/throat swelling, SOB or lightheadedness with hypotension: No Has patient had a PCN reaction causing severe rash involving mucus membranes or skin necrosis: No Has patient had a PCN reaction that required hospitalization No Has patient had a PCN reaction occurring within the last 10 years: No If all of the above answers are "NO", then may proceed with Cephalosporin use.      Family History  Problem Relation Age of Onset  . Emphysema Father   . Asthma Father   . Liver disease Father        tumor  . Heart disease Mother   . Hypertension Mother   . Asthma Sister   . Hypertension Son      Prior to Admission medications   Medication Sig Start Date End Date Taking? Authorizing Provider  amLODipine (NORVASC) 10 MG tablet Take 1 tablet (10 mg total) by mouth daily. 01/12/20  Yes  [name], DO  atorvastatin (LIPITOR) 40 MG tablet Take 40 mg by mouth daily as needed (pt states he only takes when his cholesterol is high).    Yes [provider]  AURYXIA 1 GM 210 MG(Fe) tablet Take 420 mg by mouth 3 (three) times daily. 03/23/20  Yes [provider]  cetirizine (ZYRTEC) 10 MG tablet TAKE 1 TABLET (10 MG TOTAL) BY MOUTH DAILY AS NEEDED (SEASONAL ALLERGIES). Patient taking differently: Take 10 mg by mouth daily as needed for allergies (seasonal allergies).  11/12/19  Yes [name], DO  diclofenac Sodium (VOLTAREN) 1 % GEL Apply 2 g topically 4 (four) times daily. Patient taking differently: Apply 2 g topically 4 (four) times daily as needed (pain).  03/03/20  Yes Dagoberto Ligas, PA-C  HYDROcodone-acetaminophen (NORCO/VICODIN) 5-325 MG tablet Take 1-2 tablets by mouth every 6 (six) hours as needed (gout pain). 12/29/19  Yes Bast, Traci A, NP  ixazomib citrate (NINLARO) 3 MG capsule Take 1 capsule (3 mg) by mouth weekly, 3 weeks on, 1 week off, repeat every 4 weeks. Take on an empty stomach 1hr before or 2hr after meals. Patient taking differently: Take 3 mg by mouth See admin instructions. Take 1 capsule (3 mg) by mouth weekly, 3 weeks on, 1 week off, repeat every 4 weeks. Take on an empty stomach 1hr before or 2hr after meals. 03/10/20  Yes Brunetta Genera, MD  tamsulosin (FLOMAX) 0.4 MG CAPS capsule Take 1 capsule (0.4 mg total) by mouth daily. 10/15/19  Yes [name], DO  glucose blood (ONETOUCH ULTRA) test strip Check FSBS twice a day. Dx: E11.21, N18.32 Patient taking differently: 1 each by Other route See admin instructions. Check FSBS twice a day. Dx: E11.21, N18.32 08/16/19   Fulp, Ander Gaster, MD  oxyCODONE-acetaminophen (PERCOCET/ROXICET) 5-325 MG tablet Take 1 tablet by mouth every 6 (six) hours as needed for moderate pain or severe pain. Patient not taking: Reported on 04/20/2020 03/29/20   Brunetta Genera, MD     Physical Exam: Vitals:   04/25/20 2315 04/26/20 0054 04/26/20 0130 04/26/20 0200  BP: 129/69 (!) 146/89 (!) 145/93 140/85  Pulse: 66 70 (!) 42 63  Resp: _0 Temp:  98.1 F (36.7 C)    TempSrc:  Oral    SpO2: 93% 95% 92% 93%    Constitutional: NAD, calm, comfortable Vitals:  04/25/20 2315 04/26/20 0054 04/26/20 0130 04/26/20 0200  BP: 129/69 (!) 146/89 (!) 145/93 140/85  Pulse: 66 70 (!) 42 63  Resp: _0 Temp:  98.1 F (36.7 C)    TempSrc:  Oral    SpO2: 93% 95% 92% 93%   General: WDWN, Alert and oriented x3.  Eyes: EOMI, PERRL, lids and conjunctivae normal.  Sclera nonicteric HENT:  [place]/AT, external ears normal.  Nares patent without epistasis.  Mucous membranes are moist. Posterior pharynx clear of any exudate or lesions.  Poor dentition Neck: Soft, normal range of motion, supple, no masses, no thyromegaly.  Trachea midline Respiratory: clear to auscultation bilaterally, no wheezing, no crackles. Normal respiratory effort. No accessory muscle use.  Cardiovascular: Regular rate and rhythm, no murmurs / rubs / gallops.  Mild lower extremity edema. No carotid bruits.  Abdomen: Soft, no tenderness, nondistended, no rebound or guarding.  Ventral hernia palpated is nontender.  No masses palpated. Bowel sounds normoactive Musculoskeletal: Has temporary hemodialysis catheter in right upper chest.  Normal range of motion of extremities.  Has clubbing of digits.  No cyanosis. No joint deformity upper and lower extremities. Normal muscle tone.  Skin: Warm, dry, intact no rashes, lesions, ulcers. No induration Neurologic: CN 2-12 grossly intact.  Normal speech.  Sensation intact, Strength 5/5 in all extremities.   Psychiatric: Normal judgment and insight.  Normal mood.    Labs on Admission: I have personally reviewed following labs and imaging studies  CBC: Recent Labs  Lab 04/19/20 1423 04/20/20 0755 04/25/20 1429 04/25/20 1438  WBC 6.1 6.0 8.0  --   HGB  8.3* 8.3* 8.0* 7.8*  HCT 26.2* 26.0* 25.9* 23.0*  MCV 98.1 98.1 99.6  --   PLT 197 193 193  --     Basic Metabolic Panel: Recent Labs  Lab 04/19/20 1423 04/20/20 0755 04/20/20 0856 04/25/20 1429 04/25/20 1438  NA 129* 132*  --  133* 128*  K 5.2* 5.7*  --  5.5* 5.5*  CL 91* 91*  --  92* 96*  CO2 21* 22  --  21*  --   GLUCOSE 116* 104*  --  118* 115*  BUN 60* 68*  --  67* 67*  CREATININE 12.29* 13.85*  --  13.66* 14.80*  CALCIUM 10.3 10.6*  --  10.2  --   PHOS  --   --  8.4*  --   --     GFR: Estimated Creatinine Clearance: 4.6 mL/min (A) (by C-G formula based on SCr of 14.8 mg/dL (H)).  Liver Function Tests: Recent Labs  Lab 04/20/20 0856 04/25/20 2336  AST 24 21  ALT 11 14  ALKPHOS 58 55  BILITOT 1.0 1.0  PROT 6.4* 6.6  ALBUMIN 3.0* 3.3*    Urine analysis:    Component Value Date/Time   COLORURINE STRAW (A) 02/25/2020 2318   APPEARANCEUR CLEAR 02/25/2020 2318   LABSPEC 1.006 02/25/2020 2318   PHURINE 8.0 02/25/2020 2318   GLUCOSEU NEGATIVE 02/25/2020 2318   HGBUR MODERATE (A) 02/25/2020 2318   [place] NEGATIVE 02/25/2020 2318   [facility] NEGATIVE 02/25/2020 2318   PROTEINUR 100 (A) 02/25/2020 2318   NITRITE NEGATIVE 02/25/2020 2318   LEUKOCYTESUR NEGATIVE 02/25/2020 2318    Radiological Exams on Admission: DG Chest Portable 1 View  Result Date: 04/25/2020 CLINICAL DATA:  Shortness of breath EXAM: PORTABLE CHEST 1 VIEW COMPARISON:  04/20/2020 FINDINGS: Right dialysis catheter remains in place, unchanged. Marked cardiomegaly. No confluent opacities, effusions or edema. No  acute bony abnormality. IMPRESSION: Stable cardiomegaly.  No active disease. Electronically Signed   By: Rolm Baptise M.D.   On: 04/25/2020 22:48    EKG: Independently reviewed.  EKG shows normal sinus rhythm nonspecific ST changes. RBBB. QTc 499  Assessment/Plan Principal Problem:   Syncope Mr. Tindel reports 3-4 episodes of syncope while at home.  He states it occurred when he  would get up and try and exert himself.  He did not have any chest pain or palpitations associated with syncope.  He had no seizure activity and no confusion when he regained consciousness.  He denies having any numbness or weakness of extremities.  He denies any slurred speech or drooping face.  He has not had any shortness of breath, cough or tachycardia to suggest PE as etiology of syncope.  At this time he is neurologically intact and has no neurological deficits    ESRD (end stage renal disease) ([place]) Chronic.  Followed by nephrology.  Nephrology was consulted by the ER physician and will see patient in the morning and arrange for hemodialysis    Symptomatic anemia Patient with mild anemia of 7.8 which is down from 8.3 a few days ago.  Patient will need 1 unit of PRBCs transfused during dialysis in the morning    CAD (coronary artery disease) Initial troponin was mildly elevated which may be secondary to his end-stage renal disease.  We will check serial troponins through the night.  Continue cardiac monitoring    Chronic diastolic CHF (congestive heart failure) (HCC) Chronic and stable.  Patient to have HD in the morning    Generalized weakness Consult physical therapy for evaluation in the morning    Hyperkalemia Patient was given Lokelma in the emergency room for high potassium.  Electrolytes will be rechecked in the morning. Patient is to have hemodialysis in the morning with nephrology    Hyponatremia Electrolytes will be monitored with labs in the morning.  With chronic mild hyponatremia.  Monitor electrolytes with hemodialysis.    Essential (primary) hypertension Continue home dose of Norvasc.  Monitor blood pressure.    BPH (benign prostatic hyperplasia) Continue home dose of Flomax.    Prolonged QT interval Avoid medications that will prolong QT further.  Patient will continue on cardiac telemetry monitoring   Light chain myeloma (HCC) Stable    DVT  prophylaxis: [place] score elevated.  Heparin for DVT prophylaxis Code Status:   Full code Family Communication:  Diagnosis and plan discussed with patient.  Patient verbalized understanding and agrees with plan.  Further recommendation to follow as clinically indicated Disposition Plan:   Patient is from:  Home  Anticipated DC to:  Home  Anticipated DC date:  Anticipate greater than 2 midnight stay in the hospital to treat medical condition  Anticipated DC barriers: No barriers to discharge identified at this time  Consults called:  Nephrology Admission status:  Inpatient  Severity of Illness: The appropriate patient status for this patient is INPATIENT. Inpatient status is judged to be reasonable and necessary in order to provide the required intensity of service to ensure the patient's safety. The patient's presenting symptoms, physical exam findings, and initial radiographic and laboratory data in the context of their chronic comorbidities is felt to place them at high risk for further clinical deterioration. Furthermore, it is not anticipated that the patient will be medically stable for discharge from the hospital within 2 midnights of admission. The following factors support the patient status of inpatient.   * I  certify that at the point of admission it is my clinical judgment that the patient will require inpatient hospital care spanning beyond 2 midnights from the point of admission due to high intensity of service, high risk for further deterioration and high frequency of surveillance required.Yevonne Aline Santo Zahradnik MD [facility]  How to contact the [facility] Attending or Consulting provider [place] or covering provider during after hours [place], for this patient?   1. Check the care team in [facility] and look for a) attending/consulting [facility] provider listed and b) the [facility] team listed 2. Log into [email or web address] and use [place]'s universal password to access. If you do not have the  password, please contact the hospital operator. 3. Locate the [facility] provider you are looking for under [facility] and page to a number that you can be directly reached. 4. If you still have difficulty reaching the provider, please page the [facility] (Director on Call) for the Hospitalists listed on amion for assistance.  04/26/2020, 2:14 AM

## 2020-04-26 NOTE — ED Notes (Signed)
Ordered breakfast 

## 2020-04-26 NOTE — ED Notes (Signed)
Son feel that the pt needs to be in a nursing home and would like social worker involved his name is shelton  [phone]

## 2020-04-26 NOTE — Progress Notes (Signed)
Post HD patient stood up to get post wt then pt suddenly passed out became unresponsive. Pt laid back to the stretcher. Non rebreather mask place on 15 liters, 100% oxygen. After 2-25mins patient regain his consciousness. Time 13:59 Patient alert and oriented, BP142/102, HR 113, RR22, O2-99%. And report given to Emergency dept.

## 2020-04-26 NOTE — Progress Notes (Signed)
Patient seen and examined.  Patient was admitted earlier this morning by Dr. Tonie Griffith, please see his note for detailed H&P. 74 year old gentleman with prior history of end-stage renal disease, ascending aortic aneurysm, multiple myeloma, paroxysmal atrial fibrillation presents to ED with multiple syncopal episodes. He was admitted for evaluation of syncope. Nephrology consulted for end-stage renal disease on dialysis. Patient will be monitored overnight on telemetry for arrhthymias.  PT/OT evaluation ordered. Palliative care consulted for goals of care discussion.  Hosie Poisson, MD [phone]

## 2020-04-26 NOTE — Telephone Encounter (Signed)
Received report from [facility], SW that patient is in the hospital. Per Brayton Layman, son verbalized that he is unable to care for patient and feels that patient would benefit from being admitted to nursing facility.Email sent to hospital liaisons

## 2020-04-26 NOTE — Progress Notes (Signed)
Manufacturing engineer [facility]) Community Based Palliative Care       This patient is enrolled in our palliative care services in the community.  [facility] will continue to follow for any discharge planning needs and to coordinate continuation of palliative care.    Spoke with [name] [name] last week at [facility] ED to review palliative vs hospice services.  At that time [name] [name] was clear about wishing to continue with HD and seeking treatment.  [place] liaisons are available to re-engage with [name] [name] to answer questions regarding hospice services or to offer support in any way.  If you have questions or need assistance, please call [phone] or contact the hospital Liaison listed on AMION.     Thank you for the opportunity to participate in this patient's care.     Domenic Moras, BSN, RN [facility] Liaison   [phone]

## 2020-04-26 NOTE — Consult Note (Addendum)
Hospital Consult    Reason for Consult:  2nd stage BVT Requesting Physician:  Antonietta Breach, [place] MRN #:  [id number]  History of Present Illness: This is a 74 y.o. male who has ESRD and underwent 1st stage left BVT on 01/26/2020 by Dr. Donzetta Matters.  He dialyzes on T/T/S.  Pt was scheduled for 2nd stage BVT in July, but was having palpitations and was to be rescheduled.  Pt is admitted to hospital for generalized weakness, N/V, syncope.  In the ED, he did have an elevated troponin but no acute changes on EKG.  With the syncope, they are ruling out cardiac source.  A palliative consult has been requested for goals of care.    VVS is consulted for 2nd stage BVT.    He currently dialyzes via [place] that was placed by IR on 01/24/2020.  Pt has hx of ascending aortic aneurysm, CHF, CAD, COPD, HLD, HTN, PAF, multiple myeloma and chronic anemia.    Pt is covid negative on today's test.   The pt is on a statin for cholesterol management.  The pt is not on a daily aspirin.   Other AC:  none The pt is on CCB for hypertension.   The pt is not diabetic.   Tobacco hx:  former  Past Medical History:  Diagnosis Date  . Abnormal CT of liver    a. nodular contour suggesting cirrhosis 05/2018.  [name] Abnormal LFTs (liver function tests)   . Anemia   . Anxiety   . Aortic root dilatation ([place])   . Ascending aortic aneurysm ([place])   . BPH with urinary obstruction   . Bradycardia   . 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 CAD (coronary artery disease)    a. 12/2015 NSTEMI/Cath (in setting of PAF):  LM nl, LAD 30p,  LCX 42m RCA  ok, AM 100%, RPDA1 40, RPDA 2 60 ->Med Rx.  . Childhood asthma   . Chronic diastolic CHF (congestive heart failure) ([facility]    a. 12/2015 Echo: EF 50-55%, mod AI, mod Ao root dil, mild MR, mod dil LA, mod RA.  . CKD (chronic kidney disease), stage III   . Colon cancer ([place]   . COPD (chronic obstructive pulmonary  disease) ([place]    pt denies at preop  . Diabetes mellitus type II    diet controlled  . Dysrhythmia   . History of syncope   . Hyperlipidemia   . Hypertension   . Hypertensive heart disease   . Kidney lump 04/04/2010   Overview:  Renal Cell Carcinoma   . Light chain myeloma ([facility]   . Moderate aortic insufficiency    a. 12/2015 Echo: Mod AI.  .Marland KitchenParoxysmal atrial fibrillation ([facility]    a. 12/2015 started on Xarelto (CHA2DS2VASc = 4-5).  . Peripheral vascular disease ([facility]   . Pneumonia   . Prostate cancer ([facility]   . Renal cell carcinoma ([facility] [place]   . Sleep apnea    Does not like  CPAP  . Spinal stenosis in cervical region 10/12/2016  . Venous insufficiency     Past Surgical History:  Procedure Laterality Date  . AV FISTULA PLACEMENT Left 01/26/2020   Procedure: left ARTERIOVENOUS (AV) FISTULA CREATION;  Surgeon: CWaynetta Sandy MD;  [facility]  Service: Vascular;  Laterality: Left;  . BACK SURGERY    . CARDIAC CATHETERIZATION N/A 12/18/2015   Procedure: Left Heart Cath and Coronary Angiography;  Surgeon: Leonie Man, MD;  Location: [facility];  Service: Cardiovascular;  Laterality: N/A;  . DIAGNOSTIC LAPAROSCOPY     partial colectomy  . IR FLUORO GUIDE CV LINE RIGHT  06/28/2019  . IR FLUORO GUIDE CV LINE RIGHT  01/24/2020  . IR REMOVAL TUN CV CATH W/O FL  07/26/2019  . IR US GUIDE VASC ACCESS RIGHT  06/28/2019  . IR US GUIDE VASC ACCESS RIGHT  01/24/2020  . LAPAROSCOPIC PARTIAL COLECTOMY Right 05/21/2018   Procedure: LAPAROSCOPIC RIGHT  COLECTOMY ERAS PATHWAY;  Surgeon: Leighton Ruff, MD;  Location: [facility];  Service: General;  Laterality: Right;  . [place]   "replaced a disc"    Allergies  Allergen Reactions  . Amoxicillin Other (See Comments)    Tolerates Unasyn. Can't move Has patient had a PCN reaction causing immediate rash, facial/tongue/throat swelling, SOB or lightheadedness with hypotension: No Has patient had a PCN reaction  causing severe rash involving mucus membranes or skin necrosis: No Has patient had a PCN reaction that required hospitalization No Has patient had a PCN reaction occurring within the last 10 years: No If all of the above answers are "NO", then may proceed with Cephalosporin use.   [name] Penicillins Other (See Comments)    Tolerates Unasyn. Can't move/dizziness Has patient had a PCN reaction causing immediate rash, facial/tongue/throat swelling, SOB or lightheadedness with hypotension: No Has patient had a PCN reaction causing severe rash involving mucus membranes or skin necrosis: No Has patient had a PCN reaction that required hospitalization No Has patient had a PCN reaction occurring within the last 10 years: No If all of the above answers are "NO", then may proceed with Cephalosporin use.      Prior to Admission medications   Medication Sig Start Date End Date Taking? Authorizing Provider  amLODipine (NORVASC) 10 MG tablet Take 1 tablet (10 mg total) by mouth daily. 01/12/20  Yes [name], DO  atorvastatin (LIPITOR) 40 MG tablet Take 40 mg by mouth daily as needed (pt states he only takes when his cholesterol is high).    Yes [provider]  AURYXIA 1 GM 210 MG(Fe) tablet Take 420 mg by mouth 3 (three) times daily. 03/23/20  Yes [provider]  cetirizine (ZYRTEC) 10 MG tablet TAKE 1 TABLET (10 MG TOTAL) BY MOUTH DAILY AS NEEDED (SEASONAL ALLERGIES). Patient taking differently: Take 10 mg by mouth daily as needed for allergies (seasonal allergies).  11/12/19  Yes [name], DO  diclofenac Sodium (VOLTAREN) 1 % GEL Apply 2 g topically 4 (four) times daily. Patient taking differently: Apply 2 g topically 4 (four) times daily as needed (pain).  03/03/20  Yes Dagoberto Ligas, PA-C  HYDROcodone-acetaminophen (NORCO/VICODIN) 5-325 MG tablet Take 1-2 tablets by mouth every 6 (six) hours as needed (gout pain). 12/29/19  Yes Bast, Traci A, NP  ixazomib  citrate (NINLARO) 3 MG capsule Take 1 capsule (3 mg) by mouth weekly, 3 weeks on, 1 week off, repeat every 4 weeks. Take on an empty stomach 1hr before or 2hr after meals. Patient taking differently: Take 3 mg by mouth See admin instructions. Take 1 capsule (3 mg) by mouth weekly, 3 weeks on, 1 week off, repeat every 4 weeks. Take on an empty stomach 1hr before or 2hr after meals. 03/10/20  Yes Brunetta Genera, MD  tamsulosin (FLOMAX) 0.4 MG CAPS capsule Take 1 capsule (0.4 mg total) by mouth daily. 10/15/19  Yes Phill Myron  Lauren, DO  glucose blood (ONETOUCH ULTRA) test strip Check FSBS twice a day. Dx: E11.21, N18.32 Patient taking differently: 1 each by Other route See admin instructions. Check FSBS twice a day. Dx: E11.21, N18.32 08/16/19   Fulp, Ander Gaster, MD  oxyCODONE-acetaminophen (PERCOCET/ROXICET) 5-325 MG tablet Take 1 tablet by mouth every 6 (six) hours as needed for moderate pain or severe pain. Patient not taking: Reported on 04/20/2020 03/29/20   Brunetta Genera, MD    Social History   Socioeconomic History  . Marital status: Widowed    Spouse name: Not on file  . Number of children: Not on file  . Years of education: Not on file  . Highest education level: Not on file  Occupational History  . Not on file  Tobacco Use  . Smoking status: Former Smoker    Types: Cigars    Quit date: 09/02/1977    Years since quitting: 42.6  . Smokeless tobacco: Never Used  . Tobacco comment:    Vaping Use  . Vaping Use: Never used  Substance and Sexual Activity  . Alcohol use: Not Currently    Comment: "stopped drinking alcohol in ~ 1980; just drank a little on the weekends when I did drink"  . Drug use: No  . Sexual activity: Not Currently  Other Topics Concern  . Not on file  Social History Narrative   Lives alone.  Wife died in 16-Mar-2023.  Lives in apartment.     Social Determinants of Health   Financial Resource Strain:   . Difficulty of Paying Living Expenses: Not on file   Food Insecurity:   . Worried About Charity fundraiser in the Last Year: Not on file  . Ran Out of Food in the Last Year: Not on file  Transportation Needs:   . Lack of Transportation (Medical): Not on file  . Lack of Transportation (Non-Medical): Not on file  Physical Activity:   . Days of Exercise per Week: Not on file  . Minutes of Exercise per Session: Not on file  Stress:   . Feeling of Stress : Not on file  Social Connections:   . Frequency of Communication with Friends and Family: Not on file  . Frequency of Social Gatherings with Friends and Family: Not on file  . Attends Religious Services: Not on file  . Active Member of Clubs or Organizations: Not on file  . Attends Archivist Meetings: Not on file  . Marital Status: Not on file  Intimate Partner Violence:   . Fear of Current or Ex-Partner: Not on file  . Emotionally Abused: Not on file  . Physically Abused: Not on file  . Sexually Abused: Not on file    Family History  Problem Relation Age of Onset  . Emphysema Father   . Asthma Father   . Liver disease Father        tumor  . Heart disease Mother   . Hypertension Mother   . Asthma Sister   . Hypertension Son     ROS: _0  Positive   _1  Negative   _2  All sytems reviewed and are negative  Cardiac: _3  syncope _4  heart failure  Vascular: _5  pain in legs while walking  Pulmonary: _6  OSA _7  COPD  Neurologic: _8  hx of CVA _9  mini stroke  Hematologic: _10  anemia  Endocrine:   _11  diabetes _12  thyroid disease  GI _13  GERD  GU: _14  CKD/renal failure _15  HD--_16  M/W/F or _17  T/T/S  Psychiatric: _0  anxiety _1  depression  Musculoskeletal: _2  arthritis _3  joint pain  Integumentary: _4  rashes _5  ulcers  Constitutional: _6  fever  _7  chills  Physical Examination  Vitals:   04/26/20 1323 04/26/20 1359  BP: 125/78 (!) 142/102  Pulse: 77 (!) 113  Resp: 16   Temp: (!) 97.4 F (36.3 C)   SpO2: 99% 99%   Body mass index is  27.08 kg/m.  General:  WDWN in NAD Gait: Not observed HENT: WNL, normocephalic Pulmonary: normal non-labored breathing Cardiac: regular Skin: without rashes Vascular Exam/Pulses: Bounding left radial pulse Extremities: fistula has excellent thrill present. Musculoskeletal: no muscle wasting or atrophy  Neurologic: A&O X 3; speech is fluent/normal Psychiatric:  The pt has Normal affect.   CBC    Component Value Date/Time   WBC 6.5 04/26/2020 1004   RBC 2.39 (L) 04/26/2020 1004   HGB 7.5 (L) 04/26/2020 1004   HGB 8.6 (L) 03/03/2020 0831   HGB 9.0 (L) 01/12/2020 1408   HCT 24.3 (L) 04/26/2020 1004   HCT 27.2 (L) 01/12/2020 1408   PLT 174 04/26/2020 1004   PLT 178 03/03/2020 0831   PLT 220 01/12/2020 1408   MCV 101.7 (H) 04/26/2020 1004   MCV 94 01/12/2020 1408   MCH 31.4 04/26/2020 1004   MCHC 30.9 04/26/2020 1004   RDW 20.9 (H) 04/26/2020 1004   RDW 15.4 01/12/2020 1408   LYMPHSABS 0.6 (L) 03/29/2020 1327   LYMPHSABS 1.5 11/04/2018 1515   MONOABS 0.8 03/29/2020 1327   EOSABS 0.0 03/29/2020 1327   EOSABS 0.1 11/04/2018 1515   BASOSABS 0.0 03/29/2020 1327   BASOSABS 0.0 11/04/2018 1515    BMET    Component Value Date/Time   NA 132 (L) 04/26/2020 1005   NA 143 10/14/2019 1423   K 5.5 (H) 04/26/2020 1005   CL 92 (L) 04/26/2020 1005   CO2 23 04/26/2020 1005   GLUCOSE 87 04/26/2020 1005   BUN 73 (H) 04/26/2020 1005   BUN 58 (H) 10/14/2019 1423   CREATININE 14.17 (H) 04/26/2020 1005   CREATININE 12.71 (HH) 03/29/2020 1327   CREATININE 1.01 03/29/2016 1032   CALCIUM 10.0 04/26/2020 1005   GFRNONAA 3 (L) 04/26/2020 1005   GFRNONAA 3 (L) 03/29/2020 1327   GFRAA 3 (L) 04/26/2020 1005   GFRAA 4 (L) 03/29/2020 1327    COAGS: Lab Results  Component Value Date   INR 1.0 02/18/2020   INR 1.0 12/24/2019   INR 0.9 06/28/2019     Non-Invasive Vascular Imaging:   Dialysis duplex 03/03/2020: +------------+----------+-------------+----------+----------------+    OUTFLOW VEINPSV (cm/s)Diameter (cm)Depth (cm)  Describe    +------------+----------+-------------+----------+----------------+  Prox UA     277    0.78     1.22            +------------+----------+-------------+----------+----------------+  Mid UA     375    0.67     1.10            +------------+----------+-------------+----------+----------------+  Dist UA     644    0.78     1.15  competing branch  +------------+----------+-------------+----------+----------------+    ASSESSMENT/PLAN: This is a 74 y.o. male with ESRD and s/p 1st stage left BVT on 01/26/2020 by Dr. Donzetta Matters  -fistula has excellent thrill and there is an easily palpable left raidal pulse.  Duplex on 03/03/2020 revealed fistula to be adequate size and we can schedule pt for 2nd stage BVT with Dr. Donzetta Matters sometime in the future and may be dependent on available OR  time due to covid.  Will also await palliative consult for goals of care.     Leontine Locket, PA-C [facility] [phone]   I have examined the patient, reviewed and agree with above.  The patient had been scheduled but canceled second stage transposition.  Has excellent radial pulse and excellent thrill.  Needs elective second stage basilic vein transposition when medically stable.  Will follow from the sidelines  Curt Jews, MD 04/26/2020 5:22 PM

## 2020-04-26 NOTE — Telephone Encounter (Signed)
Labs repeated during patient's ED visit.

## 2020-04-27 ENCOUNTER — Inpatient Hospital Stay (HOSPITAL_COMMUNITY): Payer: Medicare Other

## 2020-04-27 ENCOUNTER — Encounter (HOSPITAL_COMMUNITY): Payer: Self-pay | Admitting: Family Medicine

## 2020-04-27 DIAGNOSIS — I361 Nonrheumatic tricuspid (valve) insufficiency: Secondary | ICD-10-CM

## 2020-04-27 DIAGNOSIS — I371 Nonrheumatic pulmonary valve insufficiency: Secondary | ICD-10-CM

## 2020-04-27 DIAGNOSIS — I351 Nonrheumatic aortic (valve) insufficiency: Secondary | ICD-10-CM

## 2020-04-27 LAB — CBC WITH DIFFERENTIAL/PLATELET
Abs Immature Granulocytes: 0.06 10*3/uL (ref 0.00–0.07)
Basophils Absolute: 0 10*3/uL (ref 0.0–0.1)
Basophils Relative: 0 %
Eosinophils Absolute: 0 10*3/uL (ref 0.0–0.5)
Eosinophils Relative: 0 %
HCT: 25.9 % — ABNORMAL LOW (ref 39.0–52.0)
Hemoglobin: 7.9 g/dL — ABNORMAL LOW (ref 13.0–17.0)
Immature Granulocytes: 1 %
Lymphocytes Relative: 15 %
Lymphs Abs: 1.1 10*3/uL (ref 0.7–4.0)
MCH: 30.9 pg (ref 26.0–34.0)
MCHC: 30.5 g/dL (ref 30.0–36.0)
MCV: 101.2 fL — ABNORMAL HIGH (ref 80.0–100.0)
Monocytes Absolute: 0.9 10*3/uL (ref 0.1–1.0)
Monocytes Relative: 11 %
Neutro Abs: 5.5 10*3/uL (ref 1.7–7.7)
Neutrophils Relative %: 73 %
Platelets: 159 10*3/uL (ref 150–400)
RBC: 2.56 MIL/uL — ABNORMAL LOW (ref 4.22–5.81)
RDW: 20.9 % — ABNORMAL HIGH (ref 11.5–15.5)
WBC: 7.6 10*3/uL (ref 4.0–10.5)
nRBC: 0.3 % — ABNORMAL HIGH (ref 0.0–0.2)

## 2020-04-27 LAB — RENAL FUNCTION PANEL
Albumin: 2.8 g/dL — ABNORMAL LOW (ref 3.5–5.0)
Anion gap: 16 — ABNORMAL HIGH (ref 5–15)
BUN: 38 mg/dL — ABNORMAL HIGH (ref 8–23)
CO2: 22 mmol/L (ref 22–32)
Calcium: 9.5 mg/dL (ref 8.9–10.3)
Chloride: 95 mmol/L — ABNORMAL LOW (ref 98–111)
Creatinine, Ser: 9.65 mg/dL — ABNORMAL HIGH (ref 0.61–1.24)
GFR calc Af Amer: 5 mL/min — ABNORMAL LOW (ref >60)
GFR calc non Af Amer: 5 mL/min — ABNORMAL LOW (ref >60)
Glucose, Bld: 118 mg/dL — ABNORMAL HIGH (ref 70–99)
Phosphorus: 6.1 mg/dL — ABNORMAL HIGH (ref 2.5–4.6)
Potassium: 4.6 mmol/L (ref 3.5–5.1)
Sodium: 133 mmol/L — ABNORMAL LOW (ref 135–145)

## 2020-04-27 LAB — ECHOCARDIOGRAM COMPLETE
Area-P 1/2: 2.56 cm2
Height: 69 in
P 1/2 time: 671 msec
S' Lateral: 3.5 cm
Weight: 2850.11 oz

## 2020-04-27 LAB — MRSA PCR SCREENING: MRSA by PCR: NEGATIVE

## 2020-04-27 MED ORDER — HEPARIN SODIUM (PORCINE) 1000 UNIT/ML IJ SOLN
INTRAMUSCULAR | Status: AC
  Administered 2020-04-27: 1000 [IU]
  Filled 2020-04-27: qty 4

## 2020-04-27 MED ORDER — CHLORHEXIDINE GLUCONATE CLOTH 2 % EX PADS
6.0000 | MEDICATED_PAD | Freq: Every day | CUTANEOUS | Status: DC
  Administered 2020-04-28 – 2020-04-29 (×2): 6 via TOPICAL

## 2020-04-27 MED ORDER — ACETAMINOPHEN 325 MG PO TABS
ORAL_TABLET | ORAL | Status: AC
  Filled 2020-04-27: qty 2

## 2020-04-27 MED ORDER — CHLORHEXIDINE GLUCONATE CLOTH 2 % EX PADS
6.0000 | MEDICATED_PAD | Freq: Every day | CUTANEOUS | Status: DC
  Administered 2020-04-27 – 2020-04-30 (×2): 6 via TOPICAL

## 2020-04-27 NOTE — H&P (View-Only) (Signed)
   I have seen and evaluated patient and discussed second stage basilic vein av fistula with him and with his son via telephone. At this time he is going to pursue dialysis and we can plan for second stage basilic vein fistula creation tomorrow in the operating room.  He will need to be n.p.o. past midnight.  [name] [name] C. Donzetta Matters, MD [facility] [place] Office: [phone] Pager: [phone]

## 2020-04-27 NOTE — Progress Notes (Signed)
New Admission Note:   Arrival Method: Via stretcher from ED Mental Orientation:  A & O x 4 Telemetry: Tele #[id number] - NSR with PAC Assessment: Completed Skin:  Healed pressure wound to sacrum - foam placed.  Foam dressing applied.  Abrasion to anterior right knee. IV:  Rt FA NSL.  Rt HD Catheter Pain: C/O left chest pain Tubes:  None Safety Measures: Safety Fall Prevention Plan has been given, discussed and signed Admission: Completed 5 MW Orientation: Patient has been orientated to the room, unit and staff.  Family:  None at bedside - Per notes in charge, the son is requesting consideration for SNF placement at discharge.  He feels he can no longer care for his father.    Orders have been reviewed and implemented. Will continue to monitor the patient. Call light has been placed within reach and bed alarm has been activated.   Also, the patient brought his cell phone and charger with him.  He has his clothing and shoes with him also.  Earleen Reaper RN- BC, [employer] Phone number:[phone]

## 2020-04-27 NOTE — Progress Notes (Signed)
Responded to unit page to visit with [name].  Per pt request prayer was made on his behalf. Patient is very religious and likes to talk about Jesus. Provided emotional and spiritual support, listening and ministry of presence. Patient would like another visit at another time. Chaplain will follow as requested.  Jaclynn Major, [place], [facility], Pager [phone]

## 2020-04-27 NOTE — Progress Notes (Signed)
Room 5M15 finished be zapped.  Called ED to received report on patient.  RN will have patient's nurse call me back.  Room is ready.  Earleen Reaper RN

## 2020-04-27 NOTE — Procedures (Addendum)
   I was present at this dialysis session, have reviewed the session itself and made  appropriate changes Kelly Splinter MD [place] pager [phone]   04/26/2020, 10:34 AM

## 2020-04-27 NOTE — Evaluation (Signed)
Physical Therapy Evaluation Patient Details Name: [name] [name] MRN: [id number] DOB: June 30, 1946 Today's Date: 04/27/2020   History of Present Illness  The pt is a 74 yo male presenting from home with SOB, generalized weakness, and syncope. PMH includes: ESRD (HD TTS), DM II, ascending aortic aneurysim, CAD, COPD, HTN, afib, light chain myeloma.    Clinical Impression  Pt in bed upon arrival of PT, agreeable to evaluation at this time. Prior to admission the pt was living with his son, mobilizing with a SPC, and recently receiving more assist from his son for ADLs due to increased frequency of syncopal episodes. The pt now presents with limitations in functional mobility, strength, stability, and power due to above dx and chronically reduced mobility, and will continue to benefit from skilled PT to address these deficits. Despite PMH, the pt was able to demo bed mobility and transfers without a drop in BP or onset of sx. The pt did decline further ambulation at this time due to reports of fatigue and concern for onset of sx, but will benefit from continued skilled PT to progress ambulation, stability, and functional strength prior to return home.      Follow Up Recommendations SNF    Equipment Recommendations  Other (comment) (defer to post acute)    Recommendations for Other Services       Precautions / Restrictions Precautions Precautions: Fall Precaution Comments: 3L O2, pt with multiple falls due to syncope at home, not orthostatic at eval Restrictions Weight Bearing Restrictions: No      Mobility  Bed Mobility Overal bed mobility: Needs Assistance Bed Mobility: Supine to Sit     Supine to sit: Min assist     General bed mobility comments: pt able to move BLE to EOB without assist, reaching for minA through UE support to raise trunk and scoot to EOB  Transfers Overall transfer level: Needs assistance Equipment used: Rolling walker (2 wheeled) Transfers: Sit to/from  [facility] Sit to Stand: Min guard Stand pivot transfers: Min guard       General transfer comment: minG for safety, but pt able to stabilize after stand and power up without assist. Pivot to recliner on second stand with cues for sequencing but no assist needed.  Ambulation/Gait Ambulation/Gait assistance: Min guard Gait Distance (Feet): 3 Feet Assistive device: Rolling walker (2 wheeled) Gait Pattern/deviations: Step-to pattern;Shuffle Gait velocity: decreased Gait velocity interpretation: <1.31 ft/sec, indicative of household ambulator General Gait Details: small lateral steps to recliner, pt declined further ambulation due to fatigue and concern for syncope (but BP stable). No LOB, but close minG for safet due to history of falls.         Balance Overall balance assessment: Needs assistance Sitting-balance support: No upper extremity supported;Feet supported Sitting balance-Leahy Scale: Fair     Standing balance support: Single extremity supported;During functional activity Standing balance-Leahy Scale: Fair Standing balance comment: pt able to maintain with single UE support while taking BP                             Pertinent Vitals/Pain Pain Assessment: Faces Faces Pain Scale: Hurts a little bit Pain Location: chest is sore Pain Descriptors / Indicators: Sore Pain Intervention(s): Limited activity within patient's tolerance;Monitored during session    Home Living Family/patient expects to be discharged to:: Skilled nursing facility                 Additional Comments: Pt's  son states he is unable to care for the pt at this time, would prefer SNF    Prior Function Level of Independence: Needs assistance   Gait / Transfers Assistance Needed: pt reports he previously was using a SPC, but that he has been limited by syncope  ADL's / Homemaking Assistance Needed: pt reports he was previously independent with ADLs, but over last  week his son was assisting with all tasks due to pt's syncope  Comments: was previously driving until last week, unable now due to syncope. pt reports 4-5 syncopal episodes in last week alone     Hand Dominance   Dominant Hand: Right    Extremity/Trunk Assessment   Upper Extremity Assessment Upper Extremity Assessment: Overall WFL for tasks assessed    Lower Extremity Assessment Lower Extremity Assessment: Generalized weakness (pt denies difference in sensation)    Cervical / Trunk Assessment Cervical / Trunk Assessment: Kyphotic  Communication   Communication: No difficulties  Cognition Arousal/Alertness: Awake/alert Behavior During Therapy: WFL for tasks assessed/performed Overall Cognitive Status: No family/caregiver present to determine baseline cognitive functioning Area of Impairment: Safety/judgement;Problem solving                         Safety/Judgement: Decreased awareness of safety   Problem Solving: Slow processing;Requires verbal cues General Comments: tangential speech, able to recount situation well, but very repetitive. cues for safety      General Comments General comments (skin integrity, edema, etc.): BP stable throughout despite history of syncope. Pt aware of deficits and risk of falling, but agreeable to mobility with appropriate assist    Exercises     Assessment/Plan    PT Assessment Patient needs continued PT services  PT Problem List Decreased strength;Decreased mobility;Decreased range of motion;Decreased activity tolerance;Decreased balance;Cardiopulmonary status limiting activity       PT Treatment Interventions DME instruction;Therapeutic exercise;Gait training;Stair training;Functional mobility training;Therapeutic activities;Patient/family education;Balance training    PT Goals (Current goals can be found in the Care Plan section)  Acute Rehab PT Goals Patient Stated Goal: reduce frequency of syncope so he can get back to  mobilizing with a cane PT Goal Formulation: With patient Time For Goal Achievement: 05/11/20 Potential to Achieve Goals: Fair    Frequency Min 3X/week   Barriers to discharge Decreased caregiver support pt son states he is no longer able to care for the pt       AM-PAC PT "6 Clicks" Mobility  Outcome Measure Help needed turning from your back to your side while in a flat bed without using bedrails?: A Little Help needed moving from lying on your back to sitting on the side of a flat bed without using bedrails?: A Little Help needed moving to and from a bed to a chair (including a wheelchair)?: A Little Help needed standing up from a chair using your arms (e.g., wheelchair or bedside chair)?: A Little Help needed to walk in hospital room?: A Lot Help needed climbing 3-5 steps with a railing? : Total 6 Click Score: 15    End of Session Equipment Utilized During Treatment: Gait belt;Oxygen (3L) Activity Tolerance: Patient tolerated treatment well;Patient limited by fatigue Patient left: in chair;with call bell/phone within reach Nurse Communication: Mobility status (BP stable) PT Visit Diagnosis: Muscle weakness (generalized) (M62.81);Difficulty in walking, not elsewhere classified (R26.2);History of falling (Z91.81)    Time: 2440-1027 (out of room 0920 - 0934 waiting for dynamap for orthostatics.) PT Time Calculation (min) (ACUTE ONLY): 53 min  Charges:   PT Evaluation $PT Eval Moderate Complexity: 1 Mod PT Treatments $Gait Training: 8-22 mins $Therapeutic Activity: 8-22 mins        Karma Ganja, PT, DPT   Acute Rehabilitation Department Pager #: [phone]  Otho Bellows 04/27/2020, 11:47 AM

## 2020-04-27 NOTE — Progress Notes (Signed)
   I have seen and evaluated patient and discussed second stage basilic vein av fistula with him and with his son via telephone. At this time he is going to pursue dialysis and we can plan for second stage basilic vein fistula creation tomorrow in the operating room.  He will need to be n.p.o. past midnight.  Vilma Will C. Donzetta Matters, MD [facility] [place] Office: [phone] Pager: [phone]

## 2020-04-27 NOTE — Plan of Care (Signed)
  Problem: Fluid Volume: Goal: Compliance with measures to maintain balanced fluid volume will improve Outcome: Progressing   

## 2020-04-27 NOTE — Progress Notes (Signed)
Subjective: Lying down in bed status post physical therapy session, able to stand ambulate without syncope or dizziness.   For dialysis today = normal scheduled day, syncopal episode post dialysis standing at scale yesterday thought due to fluid shift postdialysis after missing multiple treatments Also agrees with VVS second stage basilic vein surgery tomorrow  Objective Vital signs in last 24 hours: Vitals:   04/27/20 0541 04/27/20 0641 04/27/20 0852 04/27/20 1050  BP:  124/80 123/82   Pulse:  78 66   Resp:  18 18   Temp:  98 F (36.7 C) 98.1 F (36.7 C)   TempSrc:  Oral Oral   SpO2:  100% 95% 95%  Weight:      Height: 5\' 9"  (1.753 m)      Weight change:   Physical Exam: General: Chronically ill thin elderly male NAD  Heart: RRR, no murmur rub or gallop appreciated Lungs: CTA anteriorly nonlabored breathing Abdomen: Bowel sounds normoactive, NT N D, ventral hernia ,no ascites appreciated Extremities:  1+ pedal edema Dialysis Access: Right IJ PermCath dressing dry clear,  left upper arm AV fistula positive bruit, developing  OP dialysis Orders: Center: [facility] on TTS, 4 hours 15 minutes, 87 kg EDW, 2K 2 calcium bath, UF profile 4, no heparin, Mircera 200 MCG last given 03/30/2020 no vitamin D on  Problem/Plan: 1. Weakness/syncope=  related to uremia /failure to thrive, in the setting of missing multiple hemodialysis treatments work-up per admit plan for dialysis 8/25, again today to keep on schedule Able to participate in physical therapy today 2. ESRD -HD TTS normal schedule, HD today keep on schedule 3. Hyponatremia-consistent with his missing hemodialysis follow-up trend after dialysis 4. Hypertension/volume  -no significant volume overload by exam admit weight in the ER 80 with EDW 87, does need standing weights if able prepost hemo, on home Norvasc monitor blood pressure closely may not need if controlled with hemo- 5. Anemia of ESRD-Hgb 7.5has been missing his Mircera at  outpatient kidney center = noncompliance txs give 200 Aranesp today q. Thursday follow-up trend,  iron studies also 6. Metabolic bone disease -phosphorus 9.7 elevated with missing hemodialysis and compliance with binders continue binders with meals no outpatient vitamin D needs PTH level drawn while in hospital 7. DM type II-Per admit 8. Nutrition -ALB 2.8 consistent with missing hemo uremia renal carb modified diet and Rena-Vite use Nepro supplement also  Ernest Haber, PA-C [facility] Beeper [phone] 04/27/2020,1:17 PM  LOS: 1 day   Labs: Basic Metabolic Panel: Recent Labs  Lab 04/25/20 1429 04/25/20 1438 04/26/20 1005  NA 133* 128* 132*  K 5.5* 5.5* 5.5*  CL 92* 96* 92*  CO2 21*  --  23  GLUCOSE 118* 115* 87  BUN 67* 67* 73*  CREATININE 13.66* 14.80* 14.17*  CALCIUM 10.2  --  10.0  PHOS  --   --  9.7*   Liver Function Tests: Recent Labs  Lab 04/25/20 2336 04/26/20 1005  AST 21  --   ALT 14  --   ALKPHOS 55  --   BILITOT 1.0  --   PROT 6.6  --   ALBUMIN 3.3* 2.8*   Recent Labs  Lab 04/25/20 2336  LIPASE 29   No results for input(s): AMMONIA in the last 168 hours. CBC: Recent Labs  Lab 04/25/20 1429 04/25/20 1429 04/25/20 1438 04/26/20 0543 04/26/20 1004  WBC 8.0  --   --  5.0 6.5  HGB 8.0*   < > 7.8* 7.2* 7.5*  HCT 25.9*   < > 23.0* 22.4* 24.3*  MCV 99.6  --   --  99.1 101.7*  PLT 193  --   --  156 174   < > = values in this interval not displayed.   Cardiac Enzymes: No results for input(s): CKTOTAL, CKMB, CKMBINDEX, TROPONINI in the last 168 hours. CBG: Recent Labs  Lab 04/26/20 0541 04/26/20 1348  GLUCAP 91 104*    Studies/Results: DG Chest Portable 1 View  Result Date: 04/25/2020 CLINICAL DATA:  Shortness of breath EXAM: PORTABLE CHEST 1 VIEW COMPARISON:  04/20/2020 FINDINGS: Right dialysis catheter remains in place, unchanged. Marked cardiomegaly. No confluent opacities, effusions or edema. No acute bony abnormality.  IMPRESSION: Stable cardiomegaly.  No active disease. Electronically Signed   By: Rolm Baptise M.D.   On: 04/25/2020 22:48   Medications: . sodium chloride     . amLODipine  10 mg Oral Daily  . Chlorhexidine Gluconate Cloth  6 each Topical Daily  . darbepoetin (ARANESP) injection - DIALYSIS  200 mcg Intravenous Q Thu-HD  . ferric citrate  420 mg Oral TID WC  . heparin  5,000 Units Subcutaneous Q8H  . sodium chloride flush  3 mL Intravenous Q12H  . sodium chloride flush  3 mL Intravenous Q12H  . sodium zirconium cyclosilicate  5 g Oral Daily  . tamsulosin  0.4 mg Oral Daily

## 2020-04-27 NOTE — Plan of Care (Signed)
  Problem: Health Behavior/Discharge Planning: Goal: Ability to manage health-related needs will improve Outcome: Progressing   Problem: Clinical Measurements: Goal: Respiratory complications will improve Outcome: Progressing   

## 2020-04-27 NOTE — Progress Notes (Signed)
   04/27/20 2100  Hand-Off documentation  Handoff Given Given to shift RN/LPN  Report given to (Full Name) Rene Paci, RN  Handoff Received Received from shift RN/LPN  Report received from (Full Name) Osha Rane  Vital Signs  Temp 98 F (36.7 C)  Temp Source Oral  Pulse Rate 78  Pulse Rate Source Monitor  Resp 13  BP (!) 129/96  BP Location Right Arm  BP Method Automatic  Patient Position (if appropriate) Lying  Oxygen Therapy  SpO2 98 %  O2 Device Nasal Cannula  O2 Flow Rate (L/min) 2 L/min  Pain Assessment  Pain Scale 0-10  Pain Score 0  Post-Hemodialysis Assessment  Rinseback Volume (mL) 250 mL  KECN 292 V  Dialyzer Clearance Lightly streaked  Duration of HD Treatment -hour(s) 2.5 hour(s)  Hemodialysis Intake (mL) 500 mL  UF Total -Machine (mL) 2000 mL  Net UF (mL) 1500 mL  Tolerated HD Treatment Yes  Post-Hemodialysis Comments tx achieved as expected, no complaints  AVG/AVF Arterial Site Held (minutes) 0 minutes  AVG/AVF Venous Site Held (minutes) 0 minutes  Education / Care Plan  Dialysis Education Provided Yes  Hemodialysis Catheter Right Internal jugular Double lumen Permanent (Tunneled)  No placement date or time found.   Orientation: Right  Access Location: Internal jugular  Hemodialysis Catheter Type: Double lumen Permanent (Tunneled)  Site Condition No complications  Blue Lumen Status Saline locked;Capped (Central line)  Red Lumen Status Occluded  Catheter fill solution Heparin 1000 units/ml  Catheter fill volume (Arterial) 1.9 cc  Catheter fill volume (Venous) 1.9  Dressing Type Occlusive  Dressing Status Clean;Dry;Intact  Interventions New dressing  Drainage Description None

## 2020-04-27 NOTE — Progress Notes (Signed)
PROGRESS NOTE    [name] [name]  JSE:[id number] DOB: Jan 16, 1946 DOA: 04/25/2020 PCP: [name], DO    Chief Complaint  Patient presents with  . Shortness of Breath  . Near Syncope    Brief Narrative:  74 year old gentleman with prior history of end-stage renal disease, ascending aortic aneurysm, multiple myeloma, paroxysmal atrial fibrillation presents to ED with multiple syncopal episodes. He was admitted for evaluation of syncope. Nephrology consulted for end-stage renal disease on dialysis.   Assessment & Plan:   Principal Problem:   Syncope Active Problems:   Essential (primary) hypertension   BPH (benign prostatic hyperplasia)   Light chain myeloma (HCC)   CAD (coronary artery disease)   Chronic diastolic CHF (congestive heart failure) (HCC)   ESRD (end stage renal disease) (HCC)   Symptomatic anemia   Generalized weakness   Hyperkalemia   Hyponatremia   Prolonged QT interval   Recurrent syncope probably secondary to fluid shift post dialysis. As per the patient he had missed multiple treatments.  Echocardiogram ordered for further evaluation.  EKG shows sinus arrhythmia with non specific t wave changes.  Orthostatic vital signs are negative at this time.  Pt currently denies any chest pain or sob.    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 Patient has missed his Mircera at outpatient kidney center due to noncompliance.  He is scheduled for Aranesp injection today.     Type 2 diabetes mellitus CBG (last 3)  Recent Labs    04/26/20 0541 04/26/20 1348  GLUCAP 91 104*   Continue with sliding scale insulin.    Hyponatremia Probably secondary to fluid overload from missing dialysis. Continue to monitor.    End-stage renal disease on dialysis schedule TTS.  Nephrology on board and appreciate recommendations. Vascular surgery on board and is in the second stage basilic vein AV fistula  schedule  Hyperkalemia Probably secondary to missed dialysis.  Check potassium in the morning.  Chronic diastolic heart failure Patient appears to be compensated at this time.   DVT prophylaxis: (/Heparin/) Code Status: full code.  Family Communication: none at bedside.  Disposition:   Status is: Inpatient  Remains inpatient appropriate because:Ongoing diagnostic testing needed not appropriate for outpatient work up   Dispo: The patient is from: Home              Anticipated d/c is to: SNF              Anticipated d/c date is: 2 days              Patient currently is not medically stable to d/c.       Consultants:   Vascular surgery  Nephrology.      Procedures:  second stage basilic vein av fistula  Scheduled tomorrow by [facility]VS.   Antimicrobials: none.   Subjective: Pt reports he has some dizziness.but no syncope today.   Objective: Vitals:   04/27/20 0541 04/27/20 0641 04/27/20 0852 04/27/20 1050  BP:  124/80 123/82   Pulse:  78 66   Resp:  18 18   Temp:  98 F (36.7 C) 98.1 F (36.7 C)   TempSrc:  Oral Oral   SpO2:  100% 95% 95%  Weight:      Height: _0  (1.753 m)       Intake/Output Summary (Last 24 hours) at 04/27/2020 1323 Last data filed at 04/27/2020 0900 Gross per 24 hour  Intake 480 ml  Output 0 ml  Net 480  ml   Filed Weights   04/26/20 1323  Weight: 80.8 kg    Examination:  General exam: Appears calm and comfortable  Respiratory system: Clear to auscultation. Respiratory effort normal. Cardiovascular system: S1 & S2 heard, RRR. No JVD,. No pedal edema. Gastrointestinal system: Abdomen is nondistended, soft and nontender.  Normal bowel sounds heard. Central nervous system: Alert and oriented. No focal neurological deficits. Extremities: no pedal edema.  Skin: No rashes seen.  Psychiatry: Mood & affect appropriate.     Data Reviewed: I have personally reviewed following labs and imaging studies  CBC: Recent Labs  Lab  04/25/20 1429 04/25/20 1438 04/26/20 0543 04/26/20 1004  WBC 8.0  --  5.0 6.5  HGB 8.0* 7.8* 7.2* 7.5*  HCT 25.9* 23.0* 22.4* 24.3*  MCV 99.6  --  99.1 101.7*  PLT 193  --  156 063    Basic Metabolic Panel: Recent Labs  Lab 04/25/20 1429 04/25/20 1438 04/26/20 1005  NA 133* 128* 132*  K 5.5* 5.5* 5.5*  CL 92* 96* 92*  CO2 21*  --  23  GLUCOSE 118* 115* 87  BUN 67* 67* 73*  CREATININE 13.66* 14.80* 14.17*  CALCIUM 10.2  --  10.0  PHOS  --   --  9.7*    GFR: Estimated Creatinine Clearance: 4.4 mL/min (A) (by C-G formula based on SCr of 14.17 mg/dL (H)).  Liver Function Tests: Recent Labs  Lab 04/25/20 2336 04/26/20 1005  AST 21  --   ALT 14  --   ALKPHOS 55  --   BILITOT 1.0  --   PROT 6.6  --   ALBUMIN 3.3* 2.8*    CBG: Recent Labs  Lab 04/26/20 0541 04/26/20 1348  GLUCAP 91 104*     Recent Results (from the past 240 hour(s))  SARS Coronavirus 2 by RT PCR (hospital order, performed in [facility] hospital lab) Nasopharyngeal Nasopharyngeal Swab     Status: None   Collection Time: 04/20/20  5:15 PM   Specimen: Nasopharyngeal Swab  Result Value Ref Range Status   SARS Coronavirus 2 NEGATIVE NEGATIVE Final    Comment: (NOTE) SARS-CoV-2 target nucleic acids are NOT DETECTED.  The SARS-CoV-2 RNA is generally detectable in upper and lower respiratory specimens during the acute phase of infection. The lowest concentration of SARS-CoV-2 viral copies this assay can detect is 250 copies / mL. A negative result does not preclude SARS-CoV-2 infection and should not be used as the sole basis for treatment or other patient management decisions.  A negative result may occur with improper specimen collection / handling, submission of specimen other than nasopharyngeal swab, presence of viral mutation(s) within the areas targeted by this assay, and inadequate number of viral copies (<250 copies / mL). A negative result must be combined with  clinical observations, patient history, and epidemiological information.  Fact Sheet for Patients:   StrictlyIdeas.no  Fact Sheet for Healthcare Providers: BankingDealers.co.za  This test is not yet approved or  cleared by the [place] FDA and has been authorized for detection and/or diagnosis of SARS-CoV-2 by FDA under an Emergency Use Authorization (EUA).  This EUA will remain in effect (meaning this test can be used) for the duration of the COVID-19 declaration under Section 564(b)(1) of the Act, 21 U.S.C. section 360bbb-3(b)(1), unless the authorization is terminated or revoked sooner.  Performed at [facility], [place] [address] [place], [place] [address]   SARS Coronavirus 2 by RT PCR (hospital order, performed in  [facility] hospital lab) Nasopharyngeal Nasopharyngeal Swab     Status: None   Collection Time: 04/26/20 12:00 AM   Specimen: Nasopharyngeal Swab  Result Value Ref Range Status   SARS Coronavirus 2 NEGATIVE NEGATIVE Final    Comment: (NOTE) SARS-CoV-2 target nucleic acids are NOT DETECTED.  The SARS-CoV-2 RNA is generally detectable in upper and lower respiratory specimens during the acute phase of infection. The lowest concentration of SARS-CoV-2 viral copies this assay can detect is 250 copies / mL. A negative result does not preclude SARS-CoV-2 infection and should not be used as the sole basis for treatment or other patient management decisions.  A negative result may occur with improper specimen collection / handling, submission of specimen other than nasopharyngeal swab, presence of viral mutation(s) within the areas targeted by this assay, and inadequate number of viral copies (<250 copies / mL). A negative result must be combined with clinical observations, patient history, and epidemiological information.  Fact Sheet for Patients:   StrictlyIdeas.no  Fact Sheet for  Healthcare Providers: BankingDealers.co.za  This test is not yet approved or  cleared by the [place] FDA and has been authorized for detection and/or diagnosis of SARS-CoV-2 by FDA under an Emergency Use Authorization (EUA).  This EUA will remain in effect (meaning this test can be used) for the duration of the COVID-19 declaration under Section 564(b)(1) of the Act, 21 U.S.C. section 360bbb-3(b)(1), unless the authorization is terminated or revoked sooner.  Performed at [facility], [place] [address] [place], [place] [address]   MRSA PCR Screening     Status: None   Collection Time: 04/27/20  4:38 AM   Specimen: Nasal Mucosa; Nasopharyngeal  Result Value Ref Range Status   MRSA by PCR NEGATIVE NEGATIVE Final    Comment:        The GeneXpert MRSA Assay (FDA approved for NASAL specimens only), is one component of a comprehensive MRSA colonization surveillance program. It is not intended to diagnose MRSA infection nor to guide or monitor treatment for MRSA infections. Performed at [facility], [place] [address], [place], [place] [address]          Radiology Studies: DG Chest Portable 1 View  Result Date: 04/25/2020 CLINICAL DATA:  Shortness of breath EXAM: PORTABLE CHEST 1 VIEW COMPARISON:  04/20/2020 FINDINGS: Right dialysis catheter remains in place, unchanged. Marked cardiomegaly. No confluent opacities, effusions or edema. No acute bony abnormality. IMPRESSION: Stable cardiomegaly.  No active disease. Electronically Signed   By: Rolm Baptise M.D.   On: 04/25/2020 22:48        Scheduled Meds: . amLODipine  10 mg Oral Daily  . Chlorhexidine Gluconate Cloth  6 each Topical Daily  . darbepoetin (ARANESP) injection - DIALYSIS  200 mcg Intravenous Q Thu-HD  . ferric citrate  420 mg Oral TID WC  . heparin  5,000 Units Subcutaneous Q8H  . sodium chloride flush  3 mL Intravenous Q12H  . sodium chloride flush  3 mL Intravenous Q12H  .  sodium zirconium cyclosilicate  5 g Oral Daily  . tamsulosin  0.4 mg Oral Daily   Continuous Infusions: . sodium chloride       LOS: 1 day        Hosie Poisson, MD [facility]   To contact the attending provider between 7A-7P or the covering provider during after hours 7P-7A, please log into the web site [email or web address] and access using universal [place] password for that web site. If  you do not have the password, please call the hospital operator.  04/27/2020, 1:23 PM

## 2020-04-27 NOTE — Progress Notes (Signed)
  Echocardiogram 2D Echocardiogram has been performed.  Chimene Salo G Stephaney Steven 04/27/2020, 1:33 PM

## 2020-04-28 ENCOUNTER — Inpatient Hospital Stay (HOSPITAL_COMMUNITY): Payer: Medicare Other | Admitting: Certified Registered"

## 2020-04-28 ENCOUNTER — Ambulatory Visit: Payer: Medicare Other | Admitting: Hematology

## 2020-04-28 ENCOUNTER — Encounter (HOSPITAL_COMMUNITY)
Admission: EM | Disposition: A | Discharge status: Skilled Nursing Facility | Payer: Self-pay | Source: Home / Self Care | Attending: Internal Medicine

## 2020-04-28 ENCOUNTER — Inpatient Hospital Stay (HOSPITAL_COMMUNITY): Payer: Medicare Other

## 2020-04-28 ENCOUNTER — Encounter (HOSPITAL_COMMUNITY): Payer: Self-pay | Admitting: Family Medicine

## 2020-04-28 ENCOUNTER — Inpatient Hospital Stay: Payer: Medicare Other | Attending: Hematology

## 2020-04-28 HISTORY — PX: BASCILIC VEIN TRANSPOSITION: SHX5742

## 2020-04-28 LAB — IRON AND TIBC
Iron: 66 ug/dL (ref 45–182)
Saturation Ratios: 30 % (ref 17.9–39.5)
TIBC: 218 ug/dL — ABNORMAL LOW (ref 250–450)
UIBC: 152 ug/dL

## 2020-04-28 LAB — GLUCOSE, CAPILLARY
Glucose-Capillary: 95 mg/dL (ref 70–99)
Glucose-Capillary: 92 mg/dL (ref 70–99)

## 2020-04-28 SURGERY — TRANSPOSITION, VEIN, BASILIC
Anesthesia: General | Site: Arm Upper | Laterality: Left

## 2020-04-28 MED ORDER — FENTANYL CITRATE (PF) 100 MCG/2ML IJ SOLN: 25.0000 ug | INTRAMUSCULAR | Status: DC | PRN

## 2020-04-28 MED ORDER — CHLORHEXIDINE GLUCONATE 0.12 % MT SOLN
15.0000 mL | Freq: Once | OROMUCOSAL | Status: AC
  Administered 2020-04-28: 15 mL via OROMUCOSAL

## 2020-04-28 MED ORDER — PHENYLEPHRINE HCL-NACL 10-0.9 MG/250ML-% IV SOLN
INTRAVENOUS | Status: DC | PRN
  Administered 2020-04-28: 50 ug/min via INTRAVENOUS

## 2020-04-28 MED ORDER — LACTATED RINGERS IV SOLN: INTRAVENOUS | Status: DC

## 2020-04-28 MED ORDER — 0.9 % SODIUM CHLORIDE (POUR BTL) OPTIME
TOPICAL | Status: DC | PRN
  Administered 2020-04-28: 1000 mL

## 2020-04-28 MED ORDER — LIDOCAINE 2% (20 MG/ML) 5 ML SYRINGE
INTRAMUSCULAR | Status: DC | PRN
  Administered 2020-04-28: 60 mg via INTRAVENOUS

## 2020-04-28 MED ORDER — PROPOFOL 10 MG/ML IV BOLUS
INTRAVENOUS | Status: DC | PRN
  Administered 2020-04-28: 120 mg via INTRAVENOUS
  Administered 2020-04-28: 30 mg via INTRAVENOUS

## 2020-04-28 MED ORDER — FENTANYL CITRATE (PF) 100 MCG/2ML IJ SOLN
INTRAMUSCULAR | Status: DC | PRN
Start: 2020-04-28 — End: 2020-04-28
  Administered 2020-04-28 (×2): 50 ug via INTRAVENOUS

## 2020-04-28 MED ORDER — EPHEDRINE SULFATE 50 MG/ML IJ SOLN: INTRAMUSCULAR | Status: DC | PRN

## 2020-04-28 MED ORDER — PHENYLEPHRINE HCL (PRESSORS) 10 MG/ML IV SOLN
INTRAVENOUS | Status: DC | PRN
  Administered 2020-04-28: 80 ug via INTRAVENOUS
  Administered 2020-04-28: 120 ug via INTRAVENOUS
  Administered 2020-04-28: 80 ug via INTRAVENOUS

## 2020-04-28 MED ORDER — SODIUM CHLORIDE 0.9 % IV SOLN: INTRAVENOUS | Status: DC | PRN

## 2020-04-28 MED ORDER — VASOPRESSIN 20 UNIT/ML IV SOLN
INTRAVENOUS | Status: DC | PRN
  Administered 2020-04-28: 1 [IU] via INTRAVENOUS

## 2020-04-28 MED ORDER — CEFAZOLIN SODIUM-DEXTROSE 2-3 GM-%(50ML) IV SOLR
INTRAVENOUS | Status: DC | PRN
  Administered 2020-04-28: 2 g via INTRAVENOUS

## 2020-04-28 MED ORDER — ONDANSETRON HCL 4 MG/2ML IJ SOLN
INTRAMUSCULAR | Status: DC | PRN
  Administered 2020-04-28: 4 mg via INTRAVENOUS

## 2020-04-28 MED ORDER — ORAL CARE MOUTH RINSE: 15.0000 mL | Freq: Once | OROMUCOSAL | Status: AC

## 2020-04-28 MED ORDER — CHLORHEXIDINE GLUCONATE 0.12 % MT SOLN
OROMUCOSAL | Status: AC
  Filled 2020-04-28: qty 15

## 2020-04-28 MED ORDER — SODIUM CHLORIDE 0.9 % IV SOLN: INTRAVENOUS | Status: DC

## 2020-04-28 SURGICAL SUPPLY — 38 items
ADH SKN CLS APL DERMABOND .7 (GAUZE/BANDAGES/DRESSINGS) ×1
ARMBAND PINK RESTRICT EXTREMIT (MISCELLANEOUS) ×3 IMPLANT
CANISTER SUCT 3000ML PPV (MISCELLANEOUS) ×3 IMPLANT
CLIP VESOCCLUDE MED 24/CT (CLIP) ×2 IMPLANT
CLIP VESOCCLUDE MED 6/CT (CLIP) IMPLANT
CLIP VESOCCLUDE SM WIDE 24/CT (CLIP) ×2 IMPLANT
CLIP VESOCCLUDE SM WIDE 6/CT (CLIP) IMPLANT
COVER PROBE W GEL 5X96 (DRAPES) ×1 IMPLANT
COVER WAND RF STERILE (DRAPES) ×1 IMPLANT
DERMABOND ADVANCED (GAUZE/BANDAGES/DRESSINGS) ×2
DERMABOND ADVANCED .7 DNX12 (GAUZE/BANDAGES/DRESSINGS) ×1 IMPLANT
ELECT REM PT RETURN 9FT ADLT (ELECTROSURGICAL) ×3
ELECTRODE REM PT RTRN 9FT ADLT (ELECTROSURGICAL) ×1 IMPLANT
GLOVE BIO SURGEON STRL SZ7.5 (GLOVE) ×3 IMPLANT
GLOVE BIOGEL PI IND STRL 6.5 (GLOVE) IMPLANT
GLOVE BIOGEL PI INDICATOR 6.5 (GLOVE) ×4
GLOVE INDICATOR 7.0 STRL GRN (GLOVE) ×2 IMPLANT
GLOVE INDICATOR 7.5 STRL GRN (GLOVE) ×2 IMPLANT
GLOVE SURG SS PI 6.5 STRL IVOR (GLOVE) ×2 IMPLANT
GOWN STRL REUS W/ TWL LRG LVL3 (GOWN DISPOSABLE) ×2 IMPLANT
GOWN STRL REUS W/ TWL XL LVL3 (GOWN DISPOSABLE) ×1 IMPLANT
GOWN STRL REUS W/TWL LRG LVL3 (GOWN DISPOSABLE) ×9
GOWN STRL REUS W/TWL XL LVL3 (GOWN DISPOSABLE) ×3
KIT BASIN OR (CUSTOM PROCEDURE TRAY) ×3 IMPLANT
KIT TURNOVER KIT B (KITS) ×3 IMPLANT
NS IRRIG 1000ML POUR BTL (IV SOLUTION) ×3 IMPLANT
PACK CV ACCESS (CUSTOM PROCEDURE TRAY) ×3 IMPLANT
PAD ARMBOARD 7.5X6 YLW CONV (MISCELLANEOUS) ×6 IMPLANT
SUT MNCRL AB 4-0 PS2 18 (SUTURE) ×5 IMPLANT
SUT PROLENE 5 0 C 1 24 (SUTURE) ×2 IMPLANT
SUT PROLENE 5 0 C1 (SUTURE) ×2 IMPLANT
SUT PROLENE 6 0 BV (SUTURE) ×3 IMPLANT
SUT SILK 2 0 SH (SUTURE) ×2 IMPLANT
SUT VIC AB 3-0 SH 27 (SUTURE) ×6
SUT VIC AB 3-0 SH 27X BRD (SUTURE) ×1 IMPLANT
TOWEL GREEN STERILE (TOWEL DISPOSABLE) ×3 IMPLANT
UNDERPAD 30X36 HEAVY ABSORB (UNDERPADS AND DIAPERS) ×3 IMPLANT
WATER STERILE IRR 1000ML POUR (IV SOLUTION) ×3 IMPLANT

## 2020-04-28 NOTE — NC FL2 (Addendum)
[place] LEVEL OF CARE SCREENING TOOL     IDENTIFICATION  Patient Name: [name] [name] Birthdate: 1946/02/09 Sex: male Admission Date (Current Location): 04/25/2020  [place] and [place] Number:  Kathleen Argue [id number] [place] and Address:  [place]. [facility], [place] [address] [place], [place] [address]      Provider Number: [id number]  Attending Physician Name and Address:  Hosie Poisson, MD  Relative Name and Phone Number:  Cylus Douville - [phone]    Current Level of Care: Hospital Recommended Level of Care: [place] Prior Approval Number:    Date Approved/Denied:   PASRR Number:  (Submitted for [place] 8/27 - number pending.)  Discharge Plan: SNF    Current Diagnoses: Patient Active Problem List   Diagnosis Date Noted  . Generalized weakness 04/26/2020  . Hyperkalemia 04/26/2020  . Hyponatremia 04/26/2020  . Prolonged QT interval 04/26/2020  . Aortic root enlargement ([place]) 02/24/2020  . Symptomatic anemia 02/04/2020  . ESRD (end stage renal disease) ([place]) 01/27/2020  . Goals of care, counseling/discussion   . Palliative care by specialist   . DNR (do not resuscitate) discussion   . ARF (acute renal failure) ([place]) 01/22/2020  . Ascending aortic aneurysm ([place]) 08/04/2019  . 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 CKD (chronic kidney disease), stage III 06/04/2019  . Renal cell carcinoma ([place]) 06/04/2019  . Gross hematuria 06/04/2019  . COPD exacerbation ([place]) 11/05/2018  . Palpitations 10/22/2018  . Counseling regarding advance care planning and goals of care 06/22/2018  . AKI (acute kidney injury) ([place])   . Anemia   . Sepsis ([place]) 06/05/2018  . Acute lower UTI 06/05/2018  . Colon polyp 05/21/2018  . Aortic valve regurgitation 04/14/2018  . Medication management 04/14/2018  . Thoracic aortic aneurysm without rupture ([place]) 09/29/2017  . 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 Head trauma, subsequent encounter 10/12/2016  . 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 Spinal stenosis in cervical region 10/12/2016  . Cervical spondylosis 10/12/2016  . Syncope 10/11/2016  . CAD (coronary artery disease)   . Chronic diastolic CHF (congestive heart failure) ([place])   . Hyperlipidemia   . Hypertensive heart disease   . Paroxysmal atrial fibrillation ([place]) 12/17/2015  . Light chain myeloma ([place]) 12/17/2015  . Mild intermittent asthma 10/18/2015  . Aortic root dilatation ([place]) 10/17/2015  . Diabetes ([place]) 10/17/2015  . 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 Hand paresthesia 12/09/2014  . Elevated prostate specific antigen (PSA) 07/12/2014  . Allergic rhinitis 07/12/2014  . LBP (low back pain) 07/12/2014  . Palpitation 07/11/2014  . Patient's other noncompliance with medication regimen 08/10/2013  . Chronic venous insufficiency 07/23/2012  . Obstructive apnea 04/11/2011  . Essential (primary) hypertension 09/07/2010  . 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 Anxiety, generalized 04/04/2010  . Cardiac conduction disorder 02/27/2010  . BPH (benign prostatic hyperplasia) 09/13/2009  . Abnormal findings on examination of genitourinary organs 08/09/2009  . Hereditary and idiopathic neuropathy 07/05/2009    Orientation RESPIRATION BLADDER Height & Weight     Self, Situation, Place  O2 (3L oxygen) Continent Weight: 193 lb 5.5 oz (87.7 kg) Height:  5\' 9"  (175.3 cm)  BEHAVIORAL SYMPTOMS/MOOD NEUROLOGICAL BOWEL NUTRITION STATUS      Continent Diet (Renal)  AMBULATORY STATUS COMMUNICATION OF NEEDS Skin   Limited Assist (Min guard) Verbally Other (Comment) (Abrasion right anterior knee; Healed  sacral pressure ulcer)                       Personal Care Assistance Level of Assistance  Bathing, Feeding, Dressing Bathing Assistance: Limited assistance Feeding assistance:  Independent Dressing Assistance: Limited assistance     Functional Limitations Info  Sight, Hearing, Speech Sight Info: Impaired Hearing Info: Adequate Speech Info: Adequate    SPECIAL CARE FACTORS FREQUENCY  PT (By licensed PT)     PT Frequency: PT evaluation 8/26. PT at SNF Eval and Treat, a minimum of 5 days per week              Contractures Contractures Info: Not present    Additional Factors Info  Code Status, Allergies Code Status Info: Full Allergies Info: Amoxicillin, Penicillins           Current Medications (04/28/2020):  This is the current hospital active medication list Current Facility-Administered Medications  Medication Dose Route Frequency Provider Last Rate Last Admin  . 0.9 %  sodium chloride infusion  250 mL Intravenous PRN Chotiner, Yevonne Aline, MD      . acetaminophen (TYLENOL) tablet 650 mg  650 mg Oral Q6H PRN Chotiner, Yevonne Aline, MD   650 mg at 04/27/20 2022   Or  . acetaminophen (TYLENOL) suppository 650 mg  650 mg Rectal Q6H PRN Chotiner, Yevonne Aline, MD      . amLODipine (NORVASC) tablet 10 mg  10 mg Oral Daily Chotiner, Yevonne Aline, MD      . atorvastatin (LIPITOR) tablet 40 mg  40 mg Oral Daily PRN Chotiner, Yevonne Aline, MD      . Chlorhexidine Gluconate Cloth 2 % PADS 6 each  6 each Topical Daily Chotiner, Yevonne Aline, MD   6 each at 04/27/20 0915  . Chlorhexidine Gluconate Cloth 2 % PADS 6 each  6 each Topical Q0600 Roney Jaffe, MD   6 each at 04/28/20 0600  . Darbepoetin Alfa (ARANESP) injection 200 mcg  200 mcg Intravenous Q Thu-HD Ernest Haber, PA-C   200 mcg at 04/27/20 2048  . ferric citrate (AURYXIA) tablet 420 mg  420 mg Oral TID WC Chotiner, Yevonne Aline, MD   420 mg at 04/27/20 0908  . heparin injection 5,000 Units  5,000 Units Subcutaneous Q8H Chotiner, Yevonne Aline, MD   5,000 Units at 04/27/20 2259  . HYDROcodone-acetaminophen (NORCO/VICODIN) 5-325 MG per tablet 1-2 tablet  1-2 tablet Oral Q6H PRN Chotiner, Yevonne Aline, MD   1 tablet at  04/26/20 2250  . senna-docusate (Senokot-S) tablet 1 tablet  1 tablet Oral QHS PRN Chotiner, Yevonne Aline, MD      . sodium chloride flush (NS) 0.9 % injection 3 mL  3 mL Intravenous Q12H Chotiner, Yevonne Aline, MD   3 mL at 04/27/20 0910  . sodium chloride flush (NS) 0.9 % injection 3 mL  3 mL Intravenous Q12H Chotiner, Yevonne Aline, MD   3 mL at 04/28/20 1108  . sodium chloride flush (NS) 0.9 % injection 3 mL  3 mL Intravenous PRN Chotiner, Yevonne Aline, MD      . sodium zirconium cyclosilicate (LOKELMA) packet 5 g  5 g Oral Daily Nuala Alpha A, PA-C   5 g at 04/27/20 0914  . tamsulosin (FLOMAX) capsule 0.4 mg  0.4 mg Oral Daily Chotiner, Yevonne Aline, MD   0.4 mg at 04/27/20 0909     Discharge Medications: Please see discharge summary for a list of discharge medications.  Relevant Imaging Results:  Relevant Lab Results:   Additional Information ss# [id number]. Dialysis patient TTS at [facility].  Chair time 7:20 am - arrive at 7 am  Sharlet Salina, Mila Homer, LCSW

## 2020-04-28 NOTE — Progress Notes (Signed)
[facility] Progress Note   Subjective:   Patient seen and examined at bedside.  Agitated this AM.  Reports being hungry.  Discussed he is NPO due to surgery planned for today.  Unsure if he wants to have surgery if he cant eat this morning.  Encouraged to stay NPO until after surgery.  Agreed for now.  Denies n/v/d, CP, SOB, and fatigue. Feeling a little weak this AM due to hunger per pt.  Says dialysis went "ok" yesterday, did not like having back to back treatments.  Made nurse aware of agitation.    Objective Vitals:   04/27/20 2100 04/27/20 2228 04/28/20 0451 04/28/20 0858  BP: (!) 129/96 122/82 123/70 116/75  Pulse: 78 99 89 92  Resp: 13 18 20 18   Temp: 98 F (36.7 C) 98.4 F (36.9 C) 99.2 F (37.3 C) 98.9 F (37.2 C)  TempSrc: Oral Oral Oral Oral  SpO2: 98% 96% 94% 97%  Weight:      Height:       Physical Exam General:chronially ill appearing, thin male in NAD Heart:RRR Lungs:CTAB Abdomen:soft, NTND, +BS Extremities:no LE edema Dialysis Access: R Ij TDC, LU AVF maturing, +b   Filed Weights   04/26/20 1323 04/27/20 1800  Weight: 80.8 kg 87.7 kg    Intake/Output Summary (Last 24 hours) at 04/28/2020 1227 Last data filed at 04/27/2020 2228 Gross per 24 hour  Intake 720 ml  Output 1501 ml  Net -781 ml    Additional Objective Labs: Basic Metabolic Panel: Recent Labs  Lab 04/25/20 1429 04/25/20 1429 04/25/20 1438 04/26/20 1005 04/27/20 1733  NA 133*   < > 128* 132* 133*  K 5.5*   < > 5.5* 5.5* 4.6  CL 92*   < > 96* 92* 95*  CO2 21*  --   --  23 22  GLUCOSE 118*   < > 115* 87 118*  BUN 67*   < > 67* 73* 38*  CREATININE 13.66*   < > 14.80* 14.17* 9.65*  CALCIUM 10.2  --   --  10.0 9.5  PHOS  --   --   --  9.7* 6.1*   < > = values in this interval not displayed.   Liver Function Tests: Recent Labs  Lab 04/25/20 2336 04/26/20 1005 04/27/20 1733  AST 21  --   --   ALT 14  --   --   ALKPHOS 55  --   --   BILITOT 1.0  --   --   PROT  6.6  --   --   ALBUMIN 3.3* 2.8* 2.8*   Recent Labs  Lab 04/25/20 2336  LIPASE 29   CBC: Recent Labs  Lab 04/25/20 1429 04/25/20 1438 04/26/20 0543 04/26/20 1004 04/27/20 1402  WBC 8.0   < > 5.0 6.5 7.6  NEUTROABS  --   --   --   --  5.5  HGB 8.0*   < > 7.2* 7.5* 7.9*  HCT 25.9*   < > 22.4* 24.3* 25.9*  MCV 99.6  --  99.1 101.7* 101.2*  PLT 193   < > 156 174 159   < > = values in this interval not displayed.   CBG: Recent Labs  Lab 04/26/20 0541 04/26/20 1348 04/28/20 0652  GLUCAP 91 104* 95   Studies/Results: ECHOCARDIOGRAM COMPLETE  Result Date: 04/27/2020    ECHOCARDIOGRAM REPORT   Patient Name:   [name] [name] Date of Exam: 04/27/2020 Medical Rec #:  [id number]  Height:       69.0 in Accession #:    [id number]   Weight:       178.1 lb Date of Birth:  04/23/1946     BSA:          1.967 m Patient Age:    74 years     BP:           123/82 mmHg Patient Gender: M            HR:           69 bpm. Exam Location:  Inpatient Procedure: 2D Echo, Cardiac Doppler and Color Doppler                             MODIFIED REPORT: This report was modified by Cherlynn Kaiser MD on 04/27/2020 due to typo.  Indications:     R55 Syncope  History:         Patient has prior history of Echocardiogram examinations, most                  recent 07/28/2019. CHF, COPD, Arrythmias:Atrial Fibrillation;                  Risk Factors:Hypertension, Diabetes, Dyslipidemia and Sleep                  Apnea. CKD. Aortic Root Dilatation. Ascending Aortic Aneurysm.  Sonographer:     Jonelle Sidle Dance Referring Phys:  Theola Sequin Diagnosing Phys: Cherlynn Kaiser MD IMPRESSIONS  1. Right ventricular systolic function is mildly reduced. The right ventricular size is severely enlarged. There is severely elevated pulmonary artery systolic pressure. The estimated right ventricular systolic pressure is 73.7 mmHg.  2. Tricuspid valve regurgitation is severe.  3. The aortic valve is abnormal. Aortic valve regurgitation is  moderate to severe. Mild aortic valve sclerosis is present, with no evidence of aortic valve stenosis.  4. Aortic dilatation noted. 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  5. Left ventricular ejection fraction, by estimation, is 55 to 60%. The left ventricle has normal function. The left ventricle has no regional wall motion abnormalities. There is mild left ventricular hypertrophy. Left ventricular diastolic parameters are consistent with Grade I diastolic dysfunction (impaired relaxation). There is the interventricular septum is flattened in systole, consistent with right ventricular pressure overload.  6. Left atrial size was moderately dilated.  7. Right atrial size was severely dilated.  8. The mitral valve is degenerative. Trivial mitral valve regurgitation. No evidence of mitral stenosis.  9. The inferior vena cava is dilated in size with <50% respiratory variability, suggesting right atrial pressure of 15 mmHg. Comparison(s): A prior study was performed on 07/28/2019. Prior images reviewed side by side. The right ventricular size has increased, tricuspid valve regurgitation is severe, and RVSP is severely elevated. Conclusion(s)/Recommendation(s): Consider evaluation for pulmonary embolism if not already performed and if clinically indicated. FINDINGS  Left Ventricle: Left ventricular ejection fraction, by estimation, is 55 to 60%. The left ventricle has normal function. The left ventricle has no regional wall motion abnormalities. The left ventricular internal cavity size was normal in size. There is  mild left ventricular hypertrophy. The interventricular septum is flattened in systole, consistent with right ventricular pressure overload. Left ventricular diastolic parameters are consistent with Grade I diastolic dysfunction (impaired relaxation). Right Ventricle: The right ventricular  size is severely enlarged. No  increase in right ventricular wall thickness. Right ventricular systolic function is mildly reduced. There is severely elevated pulmonary artery systolic pressure. The tricuspid regurgitant velocity is 3.91 m/s, and with an assumed right atrial pressure of 15 mmHg, the estimated right ventricular systolic pressure is 24.2 mmHg. Left Atrium: Left atrial size was moderately dilated. Right Atrium: Right atrial size was severely dilated. Pericardium: A small pericardial effusion is present. Mitral Valve: The mitral valve is degenerative in appearance. Normal mobility of the mitral valve leaflets. Trivial mitral valve regurgitation. No evidence of mitral valve stenosis. Tricuspid Valve: The tricuspid valve is normal in structure. Tricuspid valve regurgitation is severe. No evidence of tricuspid stenosis. Aortic Valve: The aortic valve is abnormal. Aortic valve regurgitation is moderate to severe. Aortic regurgitation PHT measures 671 msec. Mild aortic valve sclerosis is present, with no evidence of aortic valve stenosis. There is mild calcification of the aortic valve. Pulmonic Valve: The pulmonic valve was normal in structure. Pulmonic valve regurgitation is mild. No evidence of pulmonic stenosis. Aorta: Aortic dilatation noted. 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 Venous: The inferior vena cava is dilated in size with less than 50% respiratory variability, suggesting right atrial pressure of 15 mmHg. IAS/Shunts: The interatrial septum is aneurysmal. There is left bowing of the interatrial septum, suggestive of elevated right atrial pressure. No atrial level shunt detected by color flow Doppler.  LEFT VENTRICLE PLAX 2D LVIDd:         5.40 cm LVIDs:         3.50 cm LV PW:         1.30 cm LV IVS:        1.20 cm LVOT diam:     2.40 cm LV SV:         118 LV SV Index:   60 LVOT Area:     4.52 cm  RIGHT VENTRICLE             IVC RV  Basal diam:  5.20 cm     IVC diam: 3.20 cm RV Mid diam:    3.70 cm RV S prime:     19.40 cm/s TAPSE (M-mode): 2.2 cm LEFT ATRIUM              Index       RIGHT ATRIUM           Index LA diam:        5.20 cm  2.64 cm/m  RA Area:     46.40 cm LA Vol (A2C):   137.0 ml 69.66 ml/m RA Volume:   252.00 ml 128.13 ml/m LA Vol (A4C):   53.4 ml  27.15 ml/m LA Biplane Vol: 81.9 ml  41.64 ml/m  AORTIC VALVE LVOT Vmax:   151.50 cm/s LVOT Vmean:  94.650 cm/s LVOT VTI:    0.262 m AI PHT:      671 msec  AORTA Ao Root diam: 4.70 cm Ao Asc diam:  4.90 cm MITRAL VALVE               TRICUSPID VALVE MV Area (PHT): 2.56 cm    TR Peak grad:   61.2 mmHg MV Decel Time: 296 msec    TR Vmax:        391.00 cm/s MV E velocity: 66.10 cm/s MV A velocity: 77.10 cm/s  SHUNTS MV E/A ratio:  0.86  Systemic VTI:  0.26 m                            Systemic Diam: 2.40 cm Cherlynn Kaiser MD Electronically signed by Cherlynn Kaiser MD Signature Date/Time: 04/27/2020/5:30:57 PM    Final (Updated)     Medications: . sodium chloride     . amLODipine  10 mg Oral Daily  . Chlorhexidine Gluconate Cloth  6 each Topical Daily  . Chlorhexidine Gluconate Cloth  6 each Topical Q0600  . darbepoetin (ARANESP) injection - DIALYSIS  200 mcg Intravenous Q Thu-HD  . ferric citrate  420 mg Oral TID WC  . heparin  5,000 Units Subcutaneous Q8H  . sodium chloride flush  3 mL Intravenous Q12H  . sodium chloride flush  3 mL Intravenous Q12H  . sodium zirconium cyclosilicate  5 g Oral Daily  . tamsulosin  0.4 mg Oral Daily    Dialysis Orders: [facility] on TTS, 4 hours 15 minutes,87 kg EDW, 2K 2 calcium bath, UF profile 4, no heparin, Mircera 200 MCG last given 03/30/2020 no vitamin Don  Assessment/Plan: 1. Weakness/syncope - thought to be 2/2 uremia/FTT in setting of multiple missed HD.  Workup per primary - MRI ordered.   2. Hyperkalemia - 2/2 missed HD.  Resolved, K 4.6. 3. Hyponatremia - likely 2/2 missed HD.  Improved, Na 133.  4. ESRD - on HD  TTS.  Missed multiple HD.  HD tomorrow per regular schedule.  To have 2nd stage of AVF completed today.   5. Anemia of CKD- Hgb 7.9.  Has missed ESA at OP HD d/t non compliance.  Aranesp 238mcg qwk given 8/26.  Will check iron studies with HD tomorrow.  6. Secondary hyperparathyroidism -  Ca at goal. Phos elevated, continue binders.  Not on VDRA.  Order pth with HD tomorrow.   7. HTN/volume - BP well controlled.  Does not appear volume overloaded, under edw, lower.  8. Nutrition - Renal diet w/fluid restrictions. Alb 2.8, protein supplements and Vit.  9. DMT2 - per primary 10. Chronic diastolic HF - per primary   Jen Mow, PA-C [place] 04/28/2020,12:27 PM  LOS: 2 days

## 2020-04-28 NOTE — Interval H&P Note (Signed)
History and Physical Interval Note:  04/28/2020 1:51 PM  [name] [name]  has presented today for surgery, with the diagnosis of END STAGE RENAL DISEASE.  The various methods of treatment have been discussed with the patient and family. After consideration of risks, benefits and other options for treatment, the patient has consented to  Procedure(s): LEFT ARM SECOND STAGE [place] (Left) as a surgical intervention.  The patient's history has been reviewed, patient examined, no change in status, stable for surgery.  I have reviewed the patient's chart and labs.  Questions were answered to the patient's satisfaction.     Servando Snare

## 2020-04-28 NOTE — TOC Progression Note (Addendum)
Transition of Care [facility]) - Progression Note     Patient Details  Name: [name] [name] MRN: [id number] Date of Birth: [birth year]  Transition of Care [facility]) CM/SW Contact  Sharlet Salina Mila Homer, LCSW Phone Number: 04/28/2020, 5:09 PM  Clinical Narrative:  Visited with patient (4:40 pm) to talk with him about facility responses. He just came from surgery and asked CSW to call his son. Talked with Artice Bergerson [phone]) regarding SNF placement for ST rehab and he explained that he was seeking LTC placement as he can no longer take care of his dad at home. CSW expressed understanding and contacted [facility] (made a bed offer for ST rehab) and talked with Freda Munro, admissions director regarding patient and LTC request. Freda Munro indicated that they do have a LTC Medicaid bed, however would want to bring patient in [place] rehab, then transition him to [place]. Freda Munro indicated that she can take patient tomorrow, if medically ready for discharge. Son contacted and is in agreement. Contacted Freda Munro, regarding son's agreement and asked her to call son to discuss placement, etc. MD advised and COVID test requested.    Patient can discharge Saturday, 8/28 to [facility] SN[facility].     Expected Discharge Plan: Skilled Nursing Facility Barriers to Discharge: [place] [facility] G[facility]), Other (comment) (Initiated facility search 8/27)  Expected Discharge Plan and Services Expected Discharge Plan: [place] In-house Referral: Clinical Social Work     Living arrangements for the past 2 months: Apartment (Lives with son)                                     Social Determinants of Health (SDOH) Interventions  No SDOH interventions requested or needed at this time  Readmission Risk Interventions Readmission Risk Prevention Plan 02/09/2020 06/29/2019  Transportation Screening Complete Complete  Medication Review Press photographer) Referral to Pharmacy Complete  PCP or Specialist  appointment within 3-5 days of discharge Complete Complete  HRI or [place] Complete Complete  SW Recovery Care/Counseling Consult Complete Complete  Palliative Care Screening Not Applicable Not [place] Not Applicable Not Applicable  Some recent data might be hidden

## 2020-04-28 NOTE — Anesthesia Preprocedure Evaluation (Signed)
Anesthesia Evaluation  Patient identified by MRN, date of birth, ID band Patient awake    Reviewed: Allergy & Precautions, NPO status , Patient's Chart, lab work & pertinent test results  Airway Mallampati: III  TM Distance: >3 FB Neck ROM: Full    Dental  (+) Dental Advisory Given   Pulmonary asthma , sleep apnea , COPD, former smoker,    breath sounds clear to auscultation       Cardiovascular hypertension, Pt. on medications + CAD, + Peripheral Vascular Disease and +CHF  + dysrhythmias  Rhythm:Irregular Rate:Normal     Neuro/Psych negative neurological ROS     GI/Hepatic negative GI ROS, Neg liver ROS,   Endo/Other  diabetes, Type 2  Renal/GU Renal disease     Musculoskeletal  (+) Arthritis ,   Abdominal   Peds  Hematology  (+) anemia ,   Anesthesia Other Findings   Reproductive/Obstetrics                             Anesthesia Physical Anesthesia Plan  ASA: IV  Anesthesia Plan: General   Post-op Pain Management:    Induction: Intravenous  PONV Risk Score and Plan: 2 and Dexamethasone, Ondansetron and Treatment may vary due to age or medical condition  Airway Management Planned: LMA  Additional Equipment:   Intra-op Plan:   Post-operative Plan: Extubation in OR  Informed Consent: I have reviewed the patients History and Physical, chart, labs and discussed the procedure including the risks, benefits and alternatives for the proposed anesthesia with the patient or authorized representative who has indicated his/her understanding and acceptance.     Dental advisory given  Plan Discussed with: CRNA  Anesthesia Plan Comments:         Anesthesia Quick Evaluation

## 2020-04-28 NOTE — Anesthesia Procedure Notes (Addendum)
Procedure Name: LMA Insertion Date/Time: 04/28/2020 2:26 PM Performed by: Amadeo Garnet, CRNA Pre-anesthesia Checklist: Patient identified, Emergency Drugs available, Suction available and Patient being monitored Patient Re-evaluated:Patient Re-evaluated prior to induction Oxygen Delivery Method: Circle system utilized Preoxygenation: Pre-oxygenation with 100% oxygen Induction Type: IV induction Ventilation: Mask ventilation without difficulty LMA: LMA inserted LMA Size: 5.0 Placement Confirmation: positive ETCO2 Tube secured with: Tape Dental Injury: Teeth and Oropharynx as per pre-operative assessment

## 2020-04-28 NOTE — Clinical Social Work Note (Addendum)
    RE: [name] [name] of Birth:  07-27-1946 Date: 04/28/20   To Whom It May Concern:  Please be advised that the above-named patient will require a short-term nursing home stay - anticipated 30 days or less for rehabilitation and strengthening.  The plan is for return home.

## 2020-04-28 NOTE — TOC Initial Note (Addendum)
Transition of Care [facility]) - Initial/Assessment Note    Patient Details  Name: [name] [name] MRN: [id number] Date of Birth: [birth year]  Transition of Care [facility]) CM/SW Contact:    Sable Feil, LCSW Phone Number: 04/28/2020, 1:22 PM  Clinical Narrative:   CSW talked with patient at the bedside regarding his discharge disposition and the recommendation of ST rehab. CSW explained short-term rehab, staying in a facility while getting rehab and the facility search process and Mr. Casasola expressed agreement with ST rehab. He explained that he currently lives with his son and acknowledged that his son cannot take care of him at this time. CSW provided patient the Medicare.gov SNF list and patient indicated that he would give it to his son when he visits.                Expected Discharge Plan: Skilled Nursing Facility Barriers to Discharge: [place] (PASRR), Other (comment) (Initiated facility search 8/27)   Patient Goals and CMS Choice Patient states their goals for this hospitalization and ongoing recovery are:: Patient agreeable to rehab as his son cannot take care of him CMS Medicare.gov Compare Post Acute Care list provided to:: Patient Represenative (must comment) Choice offered to / list presented to : Patient  Expected Discharge Plan and Services Expected Discharge Plan: [place] In-house Referral: Clinical Social Work     Living arrangements for the past 2 months: Apartment (Lives with son)                                      Prior Living Arrangements/Services Living arrangements for the past 2 months: Apartment (Lives with son) Lives with:: Adult Children Patient language and need for interpreter reviewed:: No Do you feel safe going back to the place where you live?: No   Patient agreeable to [place] rehab  Need for Family Participation in Patient Care: Yes (Comment) Care giver support system in place?: No (comment)   Criminal  Activity/Legal Involvement Pertinent to Current Situation/Hospitalization: No - Comment as needed  Activities of Daily Living Home Assistive Devices/Equipment: Blood pressure cuff, CBG Meter, Scales, Wheelchair ADL Screening (condition at time of admission) Patient's cognitive ability adequate to safely complete daily activities?: Yes Is the patient deaf or have difficulty hearing?: Yes Does the patient have difficulty seeing, even when wearing glasses/contacts?: Yes Does the patient have difficulty concentrating, remembering, or making decisions?: No Patient able to express need for assistance with ADLs?: Yes Does the patient have difficulty dressing or bathing?: Yes Independently performs ADLs?: No Communication: Independent Dressing (OT): Needs assistance Is this a change from baseline?: Change from baseline, expected to last <3days Grooming: Independent Feeding: Independent Bathing: Needs assistance Is this a change from baseline?: Change from baseline, expected to last <3 days Toileting: Needs assistance Is this a change from baseline?: Change from baseline, expected to last <3 days In/Out Bed: Needs assistance Is this a change from baseline?: Change from baseline, expected to last <3 days Walks in Home: Needs assistance Is this a change from baseline?: Change from baseline, expected to last <3 days Does the patient have difficulty walking or climbing stairs?: Yes Weakness of Legs: Both Weakness of Arms/Hands: Both  Permission Sought/Granted   Permission granted to share information with : Yes, Verbal Permission Granted  Share Information with NAME: Iverson Sees  Permission granted to share info w AGENCY: skilled nursing facilities  Permission granted  to share info w Relationship: Son  Permission granted to share info w Contact Information: [phone]  Emotional Assessment Appearance:: Appears stated age Attitude/Demeanor/Rapport: Engaged Affect (typically observed):  Pleasant Orientation: : Oriented to Self, Oriented to Place, Oriented to Situation Alcohol / Substance Use: Tobacco Use, Alcohol Use, Illicit Drugs (Per H&P patient quit smoking, is not currently drinking and does not use illicit drugs) Psych Involvement: No (comment)  Admission diagnosis:  Hyperkalemia [E87.5] Hyponatremia [E87.1] Syncope [R55] Anemia associated with chronic renal failure [N18.9, D63.1] End-stage renal disease on hemodialysis ([place]) [N18.6, Z99.2] Syncope, unspecified syncope type [R55] Patient Active Problem List   Diagnosis Date Noted  . Generalized weakness 04/26/2020  . Hyperkalemia 04/26/2020  . Hyponatremia 04/26/2020  . Prolonged QT interval 04/26/2020  . Aortic root enlargement ([place]) 02/24/2020  . Symptomatic anemia 02/04/2020  . ESRD (end stage renal disease) ([place]) 01/27/2020  . Goals of care, counseling/discussion   . Palliative care by specialist   . DNR (do not resuscitate) discussion   . ARF (acute renal failure) ([place]) 01/22/2020  . Ascending aortic aneurysm ([place]) 08/04/2019  . 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 CKD (chronic kidney disease), stage III 06/04/2019  . Renal cell carcinoma ([place]) 06/04/2019  . Gross hematuria 06/04/2019  . COPD exacerbation ([place]) 11/05/2018  . Palpitations 10/22/2018  . Counseling regarding advance care planning and goals of care 06/22/2018  . AKI (acute kidney injury) ([place])   . Anemia   . Sepsis ([place]) 06/05/2018  . Acute lower UTI 06/05/2018  . Colon polyp 05/21/2018  . Aortic valve regurgitation 04/14/2018  . Medication management 04/14/2018  . Thoracic aortic aneurysm without rupture ([place]) 09/29/2017  . 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 Head trauma, subsequent encounter 10/12/2016  . 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 Spinal stenosis in cervical region 10/12/2016  . Cervical spondylosis 10/12/2016  . Syncope  10/11/2016  . CAD (coronary artery disease)   . Chronic diastolic CHF (congestive heart failure) ([place])   . Hyperlipidemia   . Hypertensive heart disease   . Paroxysmal atrial fibrillation ([place]) 12/17/2015  . 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 Mild intermittent asthma 10/18/2015  . Aortic root dilatation ([place]) 10/17/2015  . Diabetes ([place]) 10/17/2015  . 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 Hand paresthesia 12/09/2014  . Elevated prostate specific antigen (PSA) 07/12/2014  . Allergic rhinitis 07/12/2014  . LBP (low back pain) 07/12/2014  . Palpitation 07/11/2014  . Patient's other noncompliance with medication regimen 08/10/2013  . Chronic venous insufficiency 07/23/2012  . Obstructive apnea 04/11/2011  . Essential (primary) hypertension 09/07/2010  . 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 Anxiety, generalized 04/04/2010  . Cardiac conduction disorder 02/27/2010  . BPH (benign prostatic hyperplasia) 09/13/2009  . Abnormal findings on examination of genitourinary organs 08/09/2009  . Hereditary and idiopathic neuropathy 07/05/2009   PCP:  [name], DO Pharmacy:   [id number] [place], [place] [address] [place] [address] Phone: [phone] Fax: [phone]  [place], [place] [address] Phone: [phone] Fax: [phone]     Social Determinants of Health (SDOH) Interventions  No SDOH interventions requested or needed at this time  Readmission Risk Interventions Readmission Risk Prevention Plan 02/09/2020 06/29/2019  Transportation Screening Complete  Complete  Medication Review (RN Care Manager) Referral to Pharmacy Complete  PCP or Specialist appointment within 3-5 days of discharge Complete Complete  HRI or Home Care Consult Complete  Complete  SW Recovery Care/Counseling Consult Complete Complete  Palliative Care Screening Not Applicable Not [place] Not Applicable Not Applicable  Some recent data might be hidden

## 2020-04-28 NOTE — Transfer of Care (Signed)
Immediate Anesthesia Transfer of Care Note  Patient: [name] [name]  Procedure(s) Performed: LEFT ARM SECOND STAGE BASILIC VEIN TRANSPOSITION (Left Arm Upper)  Patient Location: PACU  Anesthesia Type:General  Level of Consciousness: drowsy and patient cooperative  Airway & Oxygen Therapy: Patient Spontanous Breathing  Post-op Assessment: Report given to RN, Post -op Vital signs reviewed and stable and Patient moving all extremities X 4  Post vital signs: Reviewed and stable  Last Vitals:  Vitals Value Taken Time  BP 124/81 04/28/20 1540  Temp    Pulse 74 04/28/20 1542  Resp 19 04/28/20 1542  SpO2 98 % 04/28/20 1542  Vitals shown include unvalidated device data.  Last Pain:  Vitals:   04/28/20 0915  TempSrc:   PainSc: 0-No pain      Patients Stated Pain Goal: 0 (64/38/37 7939)  Complications: No complications documented.

## 2020-04-28 NOTE — Discharge Instructions (Signed)
[facility]  Discharge Instructions  AV Fistula or Graft Surgery for Dialysis Access  Please refer to the following instructions for your post-procedure care. Your surgeon or physician assistant will discuss any changes with you.  Activity  You may drive the day following your surgery, if you are comfortable and no longer taking prescription pain medication. Resume full activity as the soreness in your incision resolves.  Bathing/Showering  You may shower after you go home. Keep your incision dry for 48 hours. Do not soak in a bathtub, hot tub, or swim until the incision heals completely. You may not shower if you have a hemodialysis catheter.  Incision Care  Clean your incision with mild soap and water after 48 hours. Pat the area dry with a clean towel. You do not need a bandage unless otherwise instructed. Do not apply any ointments or creams to your incision. You may have skin glue on your incision. Do not peel it off. It will come off on its own in about one week. Your arm may swell a bit after surgery. To reduce swelling use pillows to elevate your arm so it is above your heart. Your doctor will tell you if you need to lightly wrap your arm with an ACE bandage.  Diet  Resume your normal diet. There are not special food restrictions following this procedure. In order to heal from your surgery, it is CRITICAL to get adequate nutrition. Your body requires vitamins, minerals, and protein. Vegetables are the best source of vitamins and minerals. Vegetables also provide the perfect balance of protein. Processed food has little nutritional value, so try to avoid this.  Medications  Resume taking all of your medications. If your incision is causing pain, you may take over-the counter pain relievers such as acetaminophen (Tylenol). If you were prescribed a stronger pain medication, please be aware these medications can cause nausea and constipation. Prevent  nausea by taking the medication with a snack or meal. Avoid constipation by drinking plenty of fluids and eating foods with high amount of fiber, such as fruits, vegetables, and grains.  Do not take Tylenol if you are taking prescription pain medications.  Follow up Your surgeon may want to see you in the office following your access surgery. If so, this will be arranged at the time of your surgery.  Please call us immediately for any of the following conditions:  . Increased pain, redness, drainage (pus) from your incision site . Fever of 101 degrees or higher . Severe or worsening pain at your incision site . Hand pain or numbness. .  Reduce your risk of vascular disease:  . Stop smoking. If you would like help, call [facility] at 1-800-QUIT-NOW 205-234-6371) or [place] at [phone]  . Manage your cholesterol . Maintain a desired weight . Control your diabetes . Keep your blood pressure down  Dialysis  It will take several weeks to several months for your new dialysis access to be ready for use. Your surgeon will determine when it is okay to use it. Your nephrologist will continue to direct your dialysis. You can continue to use your Permcath until your new access is ready for use.   04/28/2020 [name] [name] [id number] 07/23/1946  Surgeon(s): Waynetta Sandy, MD  Procedure(s): LEFT ARM SECOND STAGE BASILIC VEIN TRANSPOSITION   May stick graft immediately   May stick graft on designated area only:   X Do not stick left AV fistula for 4 weeks  If you have any questions, please call the office at [phone].

## 2020-04-28 NOTE — Op Note (Signed)
° ° °  Patient name: [name] MRN: [id number] DOB: Sep 23, 1945 Sex: male  04/28/2020 Pre-operative Diagnosis: End-stage renal disease Post-operative diagnosis:  Same Surgeon:  Eda Paschal. Donzetta Matters, MD Assistant: Laurence Slate, PA Procedure Performed:  Revision of left arm brachial artery to basilic vein fistula with transposition  Indications: 74 year old male with end-stage renal disease dialyzing via catheter.  He has a first stage basilic vein fistula that has matured and he is indicated for second stage.  An assistant was necessary for this case for suction, retraction, anastomotic and wound closure Assistant  Findings: The fistula measured at least 1 cm throughout its course.  After transposing it superficially had a very strong thrill there was a palpable radial artery pulse at the wrist both confirmed with Doppler.   Procedure:  The patient was identified in the holding area and taken to the operating room recently supine on the table and LMA anesthesia was induced.  He was sterilely prepped draped in the left upper extremity usual fashion antibiotics were administered and a timeout was called.  We began with incision in the mid arm.  We dissected down to the palpable fistula we identified the nerve protected throughout its course.  We dissected back towards the anastomosis we opened the previous incision just above the antecubitum we dissected out back to near the anastomosis.  There was one injury in the vein that was repaired with 5-0 Prolene suture.  We marked the vein for orientation.  We made another incision in the axilla again protected the nerve up to the level axilla.  When the entire vein was dissected free and branches have been divided between ties we then clamped it near the previous anastomosis and divided it.  We flushed with heparinized saline clamped and flushed again there was no leaking.  Was tunneled laterally flushed again with heparinized saline and reclamped.  We spatulated both  ends and the end and with 6-0 Prolene suture.  Prior completion of flushing directions.  Completion was a very strong thrill in the fistula and a palpable radial artery pulse at the wrist.  These were both confirmed with Doppler.  We irrigated the wounds obtaining the stasis closed in layers with Vicryl and Monocryl.  Patient was then awakened anesthesia having tolerated procedure without any complication.  All counts were correct at completion.  EBL: 100 cc    Janika Jedlicka C. Donzetta Matters, MD [facility] [place] Office: [phone] Pager: [phone]

## 2020-04-28 NOTE — Progress Notes (Signed)
PROGRESS NOTE    [name] [name]  BWG:[id number] DOB: 10/29/1945 DOA: 04/25/2020 PCP: [name], DO    Chief Complaint  Patient presents with  . Shortness of Breath  . Near Syncope    Brief Narrative:  74 year old gentleman with prior history of end-stage renal disease, ascending aortic aneurysm, multiple myeloma, paroxysmal atrial fibrillation presents to ED with multiple syncopal episodes. He was admitted for evaluation of syncope. Nephrology consulted for end-stage renal disease on dialysis. So far patient hasn't had any episodes of syncope. It was thought secondary to fluid shift post HD. Vascular surgery on board for LEFT ARM SECOND STAGE [place] (Left) , which is scheduled today.  Pt denies any new complaints at this time.    Assessment & Plan:   Principal Problem:   Syncope Active Problems:   Essential (primary) hypertension   BPH (benign prostatic hyperplasia)   Light chain myeloma (HCC)   CAD (coronary artery disease)   Chronic diastolic CHF (congestive heart failure) (HCC)   ESRD (end stage renal disease) (HCC)   Symptomatic anemia   Generalized weakness   Hyperkalemia   Hyponatremia   Prolonged QT interval   Recurrent syncope probably secondary to fluid shift post dialysis. As per the patient he had missed multiple HD sessions.  Echocardiogram ordered for further evaluation showed in comparison to last year his right ventricular size has increased, tricuspid valve regurgitation is severe, and RVSP is severely elevated.  EKG shows sinus arrhythmia with non specific t wave changes.  Orthostatic vital signs are negative at this time.  Pt currently denies any chest pain or sob, is on the room air. His son reports that patient has occasionally complained of chest pressure. Will get /Q scan to evaluate for PE.     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 Patient has missed his Mircera at outpatient kidney  center due to noncompliance.  He is scheduled for Aranesp injection this admission.  Hemoglobin stable around 7.9  Light chain Myeloma:  Follows up with Dr Irene Limbo , last visit last month.  Progression since last treatments. Pt does not want to follow up with oncology at this time.    Renal cell carcinoma: Pt underwent PET/CT showing renal cell ca, with new areas of bony uptake particularly in the left proximal humerus and right iliac bone suspicious for myeloma vs renal ca with mets.  Oncology Dr Irene Limbo recommended outpatient follow up with Urology for further work up ASAP. Pt does not appear to be interested in doing that at this time.     Type 2 diabetes mellitus CBG (last 3)  Recent Labs    04/26/20 1348 04/28/20 0652 04/28/20 1304  GLUCAP 104* 95 92   Continue with sliding scale insulin. Recheck A1c this admission.    Hyponatremia Probably secondary to fluid overload from missing dialysis. Continue to monitor.    End-stage renal disease on dialysis schedule TTS.  Nephrology on board and appreciate recommendations. Vascular surgery on board and is scheduled to get second stage basilic vein AV fistula transposition today.   Hyperkalemia Probably secondary to missed dialysis.  Resolved.   Chronic diastolic heart failure Patient appears to be compensated at this time. Fluid balance managed by HD sessions.   Essential hypertension;  Well controlled.       DVT prophylaxis: (/Heparin/) Code Status: full code.  Family Communication: none at bedside.  Disposition:   Status is: Inpatient  Remains inpatient appropriate because:Ongoing diagnostic testing needed not  appropriate for outpatient work up   Dispo: The patient is from: Home              Anticipated d/c is to: SNF              Anticipated d/c date is: 2 days              Patient currently is not medically stable to d/c.       Consultants:   Vascular surgery  Nephrology.      Procedures:   second stage basilic vein av fistula  Scheduled tomorrow by [facility].   Antimicrobials: none.   Subjective: Reports he is hungry. No chest pain or sob.  Objective: Vitals:   04/27/20 2228 04/28/20 0451 04/28/20 0858 04/28/20 1308  BP: 122/82 123/70 116/75   Pulse: 99 89 92   Resp: 18 20 18    Temp: 98.4 F (36.9 C) 99.2 F (37.3 C) 98.9 F (37.2 C)   TempSrc: Oral Oral Oral   SpO2: 96% 94% 97%   Weight:    87.7 kg  Height:    5' 9"  (1.753 m)    Intake/Output Summary (Last 24 hours) at 04/28/2020 1534 Last data filed at 04/28/2020 1519 Gross per 24 hour  Intake 490 ml  Output 1520 ml  Net -1030 ml   Filed Weights   04/26/20 1323 04/27/20 1800 04/28/20 1308  Weight: 80.8 kg 87.7 kg 87.7 kg    Examination:  General exam: alert and comfortable.  Respiratory system: clear to auscultation , no wheezing or rhonchi.  Cardiovascular system: S1-S2 heard, regular rate rhythm, no JVD no pedal edema Gastrointestinal system: Abdomen is soft, nontender, nondistended, bowel sounds normal  Central nervous system: Alert and grossly nonfocal Extremities: No cyanosis or clubbing Skin: No rashes seen Psychiatry: Patient slightly anxious this morning.    Data Reviewed: I have personally reviewed following labs and imaging studies  CBC: Recent Labs  Lab 04/25/20 1429 04/25/20 1438 04/26/20 0543 04/26/20 1004 04/27/20 1402  WBC 8.0  --  5.0 6.5 7.6  NEUTROABS  --   --   --   --  5.5  HGB 8.0* 7.8* 7.2* 7.5* 7.9*  HCT 25.9* 23.0* 22.4* 24.3* 25.9*  MCV 99.6  --  99.1 101.7* 101.2*  PLT 193  --  156 174 122    Basic Metabolic Panel: Recent Labs  Lab 04/25/20 1429 04/25/20 1438 04/26/20 1005 04/27/20 1733  NA 133* 128* 132* 133*  K 5.5* 5.5* 5.5* 4.6  CL 92* 96* 92* 95*  CO2 21*  --  23 22  GLUCOSE 118* 115* 87 118*  BUN 67* 67* 73* 38*  CREATININE 13.66* 14.80* 14.17* 9.65*  CALCIUM 10.2  --  10.0 9.5  PHOS  --   --  9.7* 6.1*    GFR: Estimated Creatinine Clearance:  7.1 mL/min (A) (by C-G formula based on SCr of 9.65 mg/dL (H)).  Liver Function Tests: Recent Labs  Lab 04/25/20 2336 04/26/20 1005 04/27/20 1733  AST 21  --   --   ALT 14  --   --   ALKPHOS 55  --   --   BILITOT 1.0  --   --   PROT 6.6  --   --   ALBUMIN 3.3* 2.8* 2.8*    CBG: Recent Labs  Lab 04/26/20 0541 04/26/20 1348 04/28/20 0652 04/28/20 1304  GLUCAP 91 104* 95 92     Recent Results (from the past 240 hour(s))  SARS Coronavirus  2 by RT PCR (hospital order, performed in [facility] hospital lab) Nasopharyngeal Nasopharyngeal Swab     Status: None   Collection Time: 04/20/20  5:15 PM   Specimen: Nasopharyngeal Swab  Result Value Ref Range Status   SARS Coronavirus 2 NEGATIVE NEGATIVE Final    Comment: (NOTE) SARS-CoV-2 target nucleic acids are NOT DETECTED.  The SARS-CoV-2 RNA is generally detectable in upper and lower respiratory specimens during the acute phase of infection. The lowest concentration of SARS-CoV-2 viral copies this assay can detect is 250 copies / mL. A negative result does not preclude SARS-CoV-2 infection and should not be used as the sole basis for treatment or other patient management decisions.  A negative result may occur with improper specimen collection / handling, submission of specimen other than nasopharyngeal swab, presence of viral mutation(s) within the areas targeted by this assay, and inadequate number of viral copies (<250 copies / mL). A negative result must be combined with clinical observations, patient history, and epidemiological information.  Fact Sheet for Patients:   StrictlyIdeas.no  Fact Sheet for Healthcare Providers: BankingDealers.co.za  This test is not yet approved or  cleared by the [place] FDA and has been authorized for detection and/or diagnosis of SARS-CoV-2 by FDA under an Emergency Use Authorization (EUA).  This EUA will remain in effect (meaning  this test can be used) for the duration of the COVID-19 declaration under Section 564(b)(1) of the Act, 21 U.S.C. section 360bbb-3(b)(1), unless the authorization is terminated or revoked sooner.  Performed at [facility], [place] [address] [place], [place] [address]   SARS Coronavirus 2 by RT PCR (hospital order, performed in [facility] hospital lab) Nasopharyngeal Nasopharyngeal Swab     Status: None   Collection Time: 04/26/20 12:00 AM   Specimen: Nasopharyngeal Swab  Result Value Ref Range Status   SARS Coronavirus 2 NEGATIVE NEGATIVE Final    Comment: (NOTE) SARS-CoV-2 target nucleic acids are NOT DETECTED.  The SARS-CoV-2 RNA is generally detectable in upper and lower respiratory specimens during the acute phase of infection. The lowest concentration of SARS-CoV-2 viral copies this assay can detect is 250 copies / mL. A negative result does not preclude SARS-CoV-2 infection and should not be used as the sole basis for treatment or other patient management decisions.  A negative result may occur with improper specimen collection / handling, submission of specimen other than nasopharyngeal swab, presence of viral mutation(s) within the areas targeted by this assay, and inadequate number of viral copies (<250 copies / mL). A negative result must be combined with clinical observations, patient history, and epidemiological information.  Fact Sheet for Patients:   StrictlyIdeas.no  Fact Sheet for Healthcare Providers: BankingDealers.co.za  This test is not yet approved or  cleared by the [place] FDA and has been authorized for detection and/or diagnosis of SARS-CoV-2 by FDA under an Emergency Use Authorization (EUA).  This EUA will remain in effect (meaning this test can be used) for the duration of the COVID-19 declaration under Section 564(b)(1) of the Act, 21 U.S.C. section 360bbb-3(b)(1), unless the authorization is  terminated or revoked sooner.  Performed at [facility], [place] [address], [place], [place] [address]   MRSA PCR Screening     Status: None   Collection Time: 04/27/20  4:38 AM   Specimen: Nasal Mucosa; Nasopharyngeal  Result Value Ref Range Status   MRSA by PCR NEGATIVE NEGATIVE Final    Comment:  The GeneXpert MRSA Assay (FDA approved for NASAL specimens only), is one component of a comprehensive MRSA colonization surveillance program. It is not intended to diagnose MRSA infection nor to guide or monitor treatment for MRSA infections. Performed at [facility], [place] [address], [place], [place] [address]          Radiology Studies: ECHOCARDIOGRAM COMPLETE  Result Date: 04/27/2020    ECHOCARDIOGRAM REPORT   Patient Name:   Audry A Gau Date of Exam: 04/27/2020 Medical Rec #:  [id number]    Height:       69.0 in Accession #:    [id number]   Weight:       178.1 lb Date of Birth:  05-04-1946     BSA:          1.967 m Patient Age:    74 years     BP:           123/82 mmHg Patient Gender: M            HR:           69 bpm. Exam Location:  Inpatient Procedure: 2D Echo, Cardiac Doppler and Color Doppler                             MODIFIED REPORT: This report was modified by Cherlynn Kaiser MD on 04/27/2020 due to typo.  Indications:     R55 Syncope  History:         Patient has prior history of Echocardiogram examinations, most                  recent 07/28/2019. CHF, COPD, Arrythmias:Atrial Fibrillation;                  Risk Factors:Hypertension, Diabetes, Dyslipidemia and Sleep                  Apnea. CKD. Aortic Root Dilatation. Ascending Aortic Aneurysm.  Sonographer:     Jonelle Sidle Dance Referring Phys:  Theola Sequin Diagnosing Phys: Cherlynn Kaiser MD IMPRESSIONS  1. Right ventricular systolic function is mildly reduced. The right ventricular size is severely enlarged. There is severely elevated pulmonary artery systolic pressure. The estimated right ventricular  systolic pressure is 63.3 mmHg.  2. Tricuspid valve regurgitation is severe.  3. The aortic valve is abnormal. Aortic valve regurgitation is moderate to severe. Mild aortic valve sclerosis is present, with no evidence of aortic valve stenosis.  4. Aortic dilatation noted. 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  5. Left ventricular ejection fraction, by estimation, is 55 to 60%. The left ventricle has normal function. The left ventricle has no regional wall motion abnormalities. There is mild left ventricular hypertrophy. Left ventricular diastolic parameters are consistent with Grade I diastolic dysfunction (impaired relaxation). There is the interventricular septum is flattened in systole, consistent with right ventricular pressure overload.  6. Left atrial size was moderately dilated.  7. Right atrial size was severely dilated.  8. The mitral valve is degenerative. Trivial mitral valve regurgitation. No evidence of mitral stenosis.  9. The inferior vena cava is dilated in size with <50% respiratory variability, suggesting right atrial pressure of 15 mmHg. Comparison(s): A prior study was performed on 07/28/2019. Prior images reviewed side by side. The right ventricular size has increased, tricuspid valve regurgitation is severe, and RVSP is  severely elevated. Conclusion(s)/Recommendation(s): Consider evaluation for pulmonary embolism if not already performed and if clinically indicated. FINDINGS  Left Ventricle: Left ventricular ejection fraction, by estimation, is 55 to 60%. The left ventricle has normal function. The left ventricle has no regional wall motion abnormalities. The left ventricular internal cavity size was normal in size. There is  mild left ventricular hypertrophy. The interventricular septum is flattened in systole, consistent with right ventricular pressure overload. Left ventricular diastolic parameters are  consistent with Grade I diastolic dysfunction (impaired relaxation). Right Ventricle: The right ventricular size is severely enlarged. No increase in right ventricular wall thickness. Right ventricular systolic function is mildly reduced. There is severely elevated pulmonary artery systolic pressure. The tricuspid regurgitant velocity is 3.91 m/s, and with an assumed right atrial pressure of 15 mmHg, the estimated right ventricular systolic pressure is 41.3 mmHg. Left Atrium: Left atrial size was moderately dilated. Right Atrium: Right atrial size was severely dilated. Pericardium: A small pericardial effusion is present. Mitral Valve: The mitral valve is degenerative in appearance. Normal mobility of the mitral valve leaflets. Trivial mitral valve regurgitation. No evidence of mitral valve stenosis. Tricuspid Valve: The tricuspid valve is normal in structure. Tricuspid valve regurgitation is severe. No evidence of tricuspid stenosis. Aortic Valve: The aortic valve is abnormal. Aortic valve regurgitation is moderate to severe. Aortic regurgitation PHT measures 671 msec. Mild aortic valve sclerosis is present, with no evidence of aortic valve stenosis. There is mild calcification of the aortic valve. Pulmonic Valve: The pulmonic valve was normal in structure. Pulmonic valve regurgitation is mild. No evidence of pulmonic stenosis. Aorta: Aortic dilatation noted. 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 Venous: The inferior vena cava is dilated in size with less than 50% respiratory variability, suggesting right atrial pressure of 15 mmHg. IAS/Shunts: The interatrial septum is aneurysmal. There is left bowing of the interatrial septum, suggestive of elevated right atrial pressure. No atrial level shunt detected by color flow Doppler.  LEFT VENTRICLE PLAX 2D LVIDd:         5.40 cm LVIDs:         3.50 cm LV PW:         1.30 cm LV IVS:         1.20 cm LVOT diam:     2.40 cm LV SV:         118 LV SV Index:   60 LVOT Area:     4.52 cm  RIGHT VENTRICLE             IVC RV Basal diam:  5.20 cm     IVC diam: 3.20 cm RV Mid diam:    3.70 cm RV S prime:     19.40 cm/s TAPSE (M-mode): 2.2 cm LEFT ATRIUM              Index       RIGHT ATRIUM           Index LA diam:        5.20 cm  2.64 cm/m  RA Area:     46.40 cm LA Vol (A2C):   137.0 ml 69.66 ml/m RA Volume:   252.00 ml 128.13 ml/m LA Vol (A4C):   53.4 ml  27.15 ml/m LA Biplane Vol: 81.9 ml  41.64 ml/m  AORTIC VALVE LVOT Vmax:   151.50 cm/s LVOT Vmean:  94.650 cm/s LVOT VTI:    0.262  m AI PHT:      671 msec  AORTA Ao Root diam: 4.70 cm Ao Asc diam:  4.90 cm MITRAL VALVE               TRICUSPID VALVE MV Area (PHT): 2.56 cm    TR Peak grad:   61.2 mmHg MV Decel Time: 296 msec    TR Vmax:        391.00 cm/s MV E velocity: 66.10 cm/s MV A velocity: 77.10 cm/s  SHUNTS MV E/A ratio:  0.86        Systemic VTI:  0.26 m                            Systemic Diam: 2.40 cm Cherlynn Kaiser MD Electronically signed by Cherlynn Kaiser MD Signature Date/Time: 04/27/2020/5:30:57 PM    Final (Updated)         Scheduled Meds: . [MAR Hold] amLODipine  10 mg Oral Daily  . [MAR Hold] Chlorhexidine Gluconate Cloth  6 each Topical Daily  . [MAR Hold] Chlorhexidine Gluconate Cloth  6 each Topical Q0600  . [MAR Hold] darbepoetin (ARANESP) injection - DIALYSIS  200 mcg Intravenous Q Thu-HD  . [MAR Hold] ferric citrate  420 mg Oral TID WC  . [MAR Hold] heparin  5,000 Units Subcutaneous Q8H  . [MAR Hold] sodium chloride flush  3 mL Intravenous Q12H  . [MAR Hold] sodium chloride flush  3 mL Intravenous Q12H  . [MAR Hold] sodium zirconium cyclosilicate  5 g Oral Daily  . [MAR Hold] tamsulosin  0.4 mg Oral Daily   Continuous Infusions: . [MAR Hold] sodium chloride    . sodium chloride 10 mL/hr at 04/28/20 1320     LOS: 2 days        Hosie Poisson, MD [facility]   To contact the attending  provider between 7A-7P or the covering provider during after hours 7P-7A, please log into the web site [email or web address] and access using universal [place] password for that web site. If you do not have the password, please call the hospital operator.  04/28/2020, 3:34 PM

## 2020-04-29 LAB — GLUCOSE, CAPILLARY
Glucose-Capillary: 112 mg/dL — ABNORMAL HIGH (ref 70–99)
Glucose-Capillary: 99 mg/dL (ref 70–99)

## 2020-04-29 LAB — BASIC METABOLIC PANEL
Anion gap: 14 (ref 5–15)
BUN: 26 mg/dL — ABNORMAL HIGH (ref 8–23)
CO2: 25 mmol/L (ref 22–32)
Calcium: 9.2 mg/dL (ref 8.9–10.3)
Chloride: 96 mmol/L — ABNORMAL LOW (ref 98–111)
Creatinine, Ser: 8.11 mg/dL — ABNORMAL HIGH (ref 0.61–1.24)
GFR calc Af Amer: 7 mL/min — ABNORMAL LOW (ref >60)
GFR calc non Af Amer: 6 mL/min — ABNORMAL LOW (ref >60)
Glucose, Bld: 133 mg/dL — ABNORMAL HIGH (ref 70–99)
Potassium: 3.9 mmol/L (ref 3.5–5.1)
Sodium: 135 mmol/L (ref 135–145)

## 2020-04-29 LAB — CBC
HCT: 24.5 % — ABNORMAL LOW (ref 39.0–52.0)
Hemoglobin: 7.3 g/dL — ABNORMAL LOW (ref 13.0–17.0)
MCH: 31.2 pg (ref 26.0–34.0)
MCHC: 29.8 g/dL — ABNORMAL LOW (ref 30.0–36.0)
MCV: 104.7 fL — ABNORMAL HIGH (ref 80.0–100.0)
Platelets: 154 10*3/uL (ref 150–400)
RBC: 2.34 MIL/uL — ABNORMAL LOW (ref 4.22–5.81)
RDW: 21.7 % — ABNORMAL HIGH (ref 11.5–15.5)
WBC: 6.4 10*3/uL (ref 4.0–10.5)
nRBC: 0 % (ref 0.0–0.2)

## 2020-04-29 LAB — MAGNESIUM: Magnesium: 2.1 mg/dL (ref 1.7–2.4)

## 2020-04-29 LAB — PARATHYROID HORMONE, INTACT (NO CA): PTH: 64 pg/mL (ref 15–65)

## 2020-04-29 MED ORDER — LIDOCAINE-PRILOCAINE 2.5-2.5 % EX CREA: 1.0000 "application " | TOPICAL_CREAM | CUTANEOUS | Status: DC | PRN

## 2020-04-29 MED ORDER — PENTAFLUOROPROP-TETRAFLUOROETH EX AERO: 1.0000 "application " | INHALATION_SPRAY | CUTANEOUS | Status: DC | PRN

## 2020-04-29 MED ORDER — SODIUM CHLORIDE 0.9 % IV SOLN: 100.0000 mL | INTRAVENOUS | Status: DC | PRN

## 2020-04-29 MED ORDER — LIDOCAINE HCL (PF) 1 % IJ SOLN: 5.0000 mL | INTRAMUSCULAR | Status: DC | PRN

## 2020-04-29 MED ORDER — ALTEPLASE 2 MG IJ SOLR: 2.0000 mg | Freq: Once | INTRAMUSCULAR | Status: DC | PRN

## 2020-04-29 MED ORDER — HEPARIN SODIUM (PORCINE) 1000 UNIT/ML DIALYSIS: 1000.0000 [IU] | INTRAMUSCULAR | Status: DC | PRN

## 2020-04-29 MED ORDER — HEPARIN SODIUM (PORCINE) 1000 UNIT/ML IJ SOLN
INTRAMUSCULAR | Status: AC
  Filled 2020-04-29: qty 4

## 2020-04-29 NOTE — Progress Notes (Addendum)
[facility] Progress Note   Subjective:   Patient seen and examined at bedside in dialysis, tolerating well so far.  Reports surgery went well yesterday, minimal pain.  Denies SOB, CP, n/v/d, and abdominal pain.  Admits to pain around R knee, says it has been going on for a while.    Objective Vitals:   04/28/20 1624 04/28/20 2059 04/29/20 0150 04/29/20 0331  BP: 115/77 111/74 132/72 122/68  Pulse: 84 88  90  Resp: 18 16 (!) 22 19  Temp: 97.7 F (36.5 C) 98.8 F (37.1 C)    TempSrc: Oral Oral    SpO2: 100% 97%    Weight:      Height:       Physical Exam General:chronically ill appearing male in NAD3 Heart:regular rate, irregular rhythm Lungs:CTAB, nml WOB Abdomen:soft, NTND Extremities:no LE edema Dialysis Access: TDC in use, LU AVF +thrill   Filed Weights   04/26/20 1323 04/27/20 1800 04/28/20 1308  Weight: 80.8 kg 87.7 kg 87.7 kg    Intake/Output Summary (Last 24 hours) at 04/29/2020 0827 Last data filed at 04/29/2020 0600 Gross per 24 hour  Intake 1570 ml  Output 20 ml  Net 1550 ml    Additional Objective Labs: Basic Metabolic Panel: Recent Labs  Lab 04/26/20 1005 04/27/20 1733 04/29/20 0417  NA 132* 133* 135  K 5.5* 4.6 3.9  CL 92* 95* 96*  CO2 23 22 25   GLUCOSE 87 118* 133*  BUN 73* 38* 26*  CREATININE 14.17* 9.65* 8.11*  CALCIUM 10.0 9.5 9.2  PHOS 9.7* 6.1*  --    Liver Function Tests: Recent Labs  Lab 04/25/20 2336 04/26/20 1005 04/27/20 1733  AST 21  --   --   ALT 14  --   --   ALKPHOS 55  --   --   BILITOT 1.0  --   --   PROT 6.6  --   --   ALBUMIN 3.3* 2.8* 2.8*   Recent Labs  Lab 04/25/20 2336  LIPASE 29   CBC: Recent Labs  Lab 04/25/20 1429 04/25/20 1438 04/26/20 0543 04/26/20 0543 04/26/20 1004 04/27/20 1402 04/29/20 0750  WBC 8.0   < > 5.0   < > 6.5 7.6 6.4  NEUTROABS  --   --   --   --   --  5.5  --   HGB 8.0*   < > 7.2*   < > 7.5* 7.9* 7.3*  HCT 25.9*   < > 22.4*   < > 24.3* 25.9* 24.5*  MCV 99.6   --  99.1  --  101.7* 101.2* 104.7*  PLT 193   < > 156   < > 174 159 154   < > = values in this interval not displayed.   CBG: Recent Labs  Lab 04/26/20 1348 04/28/20 0652 04/28/20 1304 04/29/20 0144 04/29/20 0639  GLUCAP 104* 95 92 112* 99   Iron Studies:  Recent Labs    04/28/20 1819  IRON 66  TIBC 218*     Medications: . sodium chloride    . sodium chloride    . sodium chloride     . amLODipine  10 mg Oral Daily  . Chlorhexidine Gluconate Cloth  6 each Topical Daily  . Chlorhexidine Gluconate Cloth  6 each Topical Q0600  . darbepoetin (ARANESP) injection - DIALYSIS  200 mcg Intravenous Q Thu-HD  . ferric citrate  420 mg Oral TID WC  . heparin  5,000 Units Subcutaneous Q8H  .  sodium chloride flush  3 mL Intravenous Q12H  . sodium chloride flush  3 mL Intravenous Q12H  . sodium zirconium cyclosilicate  5 g Oral Daily  . tamsulosin  0.4 mg Oral Daily    Dialysis Orders: [facility] on TTS, 4 hours 15 minutes,87 kg EDW, 2K 2 calcium bath, UF profile 4, no heparin, Mircera 200 MCG last given 03/30/2020 no VDRA  Assessment/Plan: 1. Weakness/syncope - thought to be 2/2 uremia/FTT in setting of multiple missed HD.  Workup per primary - MRI with no acute findings, chronic changes.   2. Hyperkalemia - resolved, K 3.9. 3. Hyponatremia - likely 2/2 missed HD.  Improved, Na 135.  4. ESRD - on HD TTS.  Missed multiple HD.  HD today per regular schedule.  2nd stage of AVF completed 04/28/20 by Dr. Donzetta Matters. They anticipate f/u visit in 3-4 wks and AVF should be ready then.  5. Anemia of CKD- Hgb 7.3.  Has missed ESA at OP HD d/t non compliance.  Aranesp 279mcg qwk given 8/26.  tsat 30%, no indication for IV iron at this time.   6. Secondary hyperparathyroidism -  Ca at goal. Last Phos elevated, continue binders.  Not on VDRA.  pth ordered.   7. HTN/volume - BP well controlled.  Does not appear volume overloaded, under edw, lower.  8. Nutrition - Renal diet w/fluid restrictions. Alb 2.8,  protein supplements and Vit.  9. DMT2 - per primary 10. Chronic diastolic HF - per primary  Jen Mow, PA-C [place] 04/29/2020,8:27 AM  LOS: 3 days   Pt seen, examined and agree w assess/plan as above with additions as indicated.  [facility] 04/29/2020, 2:53 PM

## 2020-04-29 NOTE — TOC Progression Note (Signed)
Transition of Care [facility]) - Progression Note    Patient Details  Name: [name] [name] MRN: [id number] Date of Birth: [birth year]  Transition of Care [facility]) CM/SW [place], [place] Phone Number: 04/29/2020, 3:30 PM  Clinical Narrative:    Patient's VQ scan is still pending per MD. CSW contacted [facility] to provide an update on potential discharge, awaiting a callback.   Expected Discharge Plan: Skilled Nursing Facility Barriers to Discharge: [place] (PASRR), Other (comment) (Initiated facility search 8/27)  Expected Discharge Plan and Services Expected Discharge Plan: [place] In-house Referral: Clinical Social Work     Living arrangements for the past 2 months: Apartment (Lives with son)                                       Social Determinants of Health (SDOH) Interventions    Readmission Risk Interventions Readmission Risk Prevention Plan 02/09/2020 06/29/2019  Transportation Screening Complete Complete  Medication Review [employer]) Referral to Pharmacy Complete  PCP or Specialist appointment within 3-5 days of discharge Complete Complete  HRI or [place] Complete Complete  SW Recovery Care/Counseling Consult Complete Complete  Palliative Care Screening Not Applicable Not [place] Not Applicable Not Applicable  Some recent data might be hidden

## 2020-04-29 NOTE — Anesthesia Postprocedure Evaluation (Signed)
Anesthesia Post Note  Patient: [name] [name]  Procedure(s) Performed: LEFT ARM SECOND STAGE BASILIC VEIN TRANSPOSITION (Left Arm Upper)     Patient location during evaluation: PACU Anesthesia Type: General Level of consciousness: awake and alert Pain management: pain level controlled Vital Signs Assessment: post-procedure vital signs reviewed and stable Respiratory status: spontaneous breathing, nonlabored ventilation, respiratory function stable and patient connected to nasal cannula oxygen Cardiovascular status: blood pressure returned to baseline and stable Postop Assessment: no apparent nausea or vomiting Anesthetic complications: no   No complications documented.  Last Vitals:  Vitals:   04/29/20 1046 04/29/20 1201  BP: 124/80 127/80  Pulse: 82 92  Resp: (!) 21 18  Temp: (!) 36.4 C 37.1 C  SpO2: 98% 99%    Last Pain:  Vitals:   04/29/20 1201  TempSrc: Oral  PainSc:                  Tiajuana Amass

## 2020-04-29 NOTE — Progress Notes (Signed)
   04/29/20 0331  Vitals  BP 122/68  BP Location Right Arm  BP Method Manual  Patient Position (if appropriate) Lying  Pulse Rate 90  Pulse Rate Source Monitor  Resp 19  Level of Consciousness  Level of Consciousness Alert  MEWS COLOR  MEWS Score Color Green  MEWS Score  MEWS Temp 0  MEWS Systolic 0  MEWS Pulse 0  MEWS RR 0  MEWS LOC 0  MEWS Score 0   Called to patient's room.  He states he is dizzy and woke up because he was going to pass out.  Manual B/P is 122/68.  HR 90.  He is A & O x 4.  He asked for saltines and ice water to get something in his his system.  Will continue to monitor patent.  Earleen Reaper RN

## 2020-04-29 NOTE — Progress Notes (Signed)
   04/29/20 0159  Provider Notification  Provider Name/Title Dr. Myna Hidalgo  Date Provider Notified 04/29/20  Time Provider Notified 0159  Notification Type Page  Notification Reason Change in status (Felt like going to "pass out"..2 5 beat Vtach & mult PVC)  Response See new orders  Date of Provider Response 04/29/20   While repositioning patient in the bed, he complained of feeling like he was going to "pass out".  Dinamap showed B/P of 75/46.  When checked manually, it was 132/72.  On Telemetry, he is SR with multiple PVCs.  During the night, he was had 2 different runs of [place].  He does not complain of chest tightness/pressure.  Dr. Myna Hidalgo made aware.  Additional labs ordered for in the morning.  Will continue to monitor patient.  Earleen Reaper RN

## 2020-04-29 NOTE — Progress Notes (Signed)
  Progress Note    04/29/2020 12:12 PM 1 Day Post-Op  Subjective:  Evaluated on hd, left arm feeling satisfactory  Vitals:   04/29/20 1046 04/29/20 1201  BP: 124/80 127/80  Pulse: 82 92  Resp: (!) 21 18  Temp: (!) 97.5 F (36.4 C) 98.7 F (37.1 C)  SpO2: 98% 99%    Physical Exam: aaox3 Non labored respiraitions Strong left radial pulse and upper arm thrill  CBC    Component Value Date/Time   WBC 6.4 04/29/2020 0750   RBC 2.34 (L) 04/29/2020 0750   HGB 7.3 (L) 04/29/2020 0750   HGB 8.6 (L) 03/03/2020 0831   HGB 9.0 (L) 01/12/2020 1408   HCT 24.5 (L) 04/29/2020 0750   HCT 27.2 (L) 01/12/2020 1408   PLT 154 04/29/2020 0750   PLT 178 03/03/2020 0831   PLT 220 01/12/2020 1408   MCV 104.7 (H) 04/29/2020 0750   MCV 94 01/12/2020 1408   MCH 31.2 04/29/2020 0750   MCHC 29.8 (L) 04/29/2020 0750   RDW 21.7 (H) 04/29/2020 0750   RDW 15.4 01/12/2020 1408   LYMPHSABS 1.1 04/27/2020 1402   LYMPHSABS 1.5 11/04/2018 1515   MONOABS 0.9 04/27/2020 1402   EOSABS 0.0 04/27/2020 1402   EOSABS 0.1 11/04/2018 1515   BASOSABS 0.0 04/27/2020 1402   BASOSABS 0.0 11/04/2018 1515    BMET    Component Value Date/Time   NA 135 04/29/2020 0417   NA 143 10/14/2019 1423   K 3.9 04/29/2020 0417   CL 96 (L) 04/29/2020 0417   CO2 25 04/29/2020 0417   GLUCOSE 133 (H) 04/29/2020 0417   BUN 26 (H) 04/29/2020 0417   BUN 58 (H) 10/14/2019 1423   CREATININE 8.11 (H) 04/29/2020 0417   CREATININE 12.71 (HH) 03/29/2020 1327   CREATININE 1.01 03/29/2016 1032   CALCIUM 9.2 04/29/2020 0417   GFRNONAA 6 (L) 04/29/2020 0417   GFRNONAA 3 (L) 03/29/2020 1327   GFRAA 7 (L) 04/29/2020 0417   GFRAA 4 (L) 03/29/2020 1327    INR    Component Value Date/Time   INR 1.0 02/18/2020 1025     Intake/Output Summary (Last 24 hours) at 04/29/2020 1212 Last data filed at 04/29/2020 1046 Gross per 24 hour  Intake 1570 ml  Output 474 ml  Net 1096 ml     Assessment/plan:  74 y.o. male is s/p left  2nd stage bvt creation on hd via catheter. Will arrange office f/u in 3-4 weeks and should be ok to cannulate around that time given appearance of fistula yesterday and time since initial surgery.   Josee Speece C. Donzetta Matters, MD [facility] Office: [phone] Pager: [phone]  04/29/2020 12:12 PM

## 2020-04-29 NOTE — Progress Notes (Signed)
PROGRESS NOTE    [name] [name]  VCB:[id number] DOB: 15-Jul-1946 DOA: 04/25/2020 PCP: [name], DO    Chief Complaint  Patient presents with  . Shortness of Breath  . Near Syncope    Brief Narrative:  74 year old gentleman with prior history of end-stage renal disease, ascending aortic aneurysm, multiple myeloma, paroxysmal atrial fibrillation presents to ED with multiple syncopal episodes. He was admitted for evaluation of syncope. Nephrology consulted for end-stage renal disease on dialysis. So far patient hasn't had any episodes of syncope. It was thought secondary to fluid shift post HD. Vascular surgery on board, performed left arm second stage basilic vein transposition. recommend outpatient follow up with vascular surgery in 3 to 4 weeks.  Pt seen and examined at bedside, he reports some shortness of breath and is 2 lit of [place] oxygen. He reports pain in the left arm.   Assessment & Plan:   Principal Problem:   Syncope Active Problems:   Essential (primary) hypertension   BPH (benign prostatic hyperplasia)   Light chain myeloma (HCC)   CAD (coronary artery disease)   Chronic diastolic CHF (congestive heart failure) (HCC)   ESRD (end stage renal disease) (HCC)   Symptomatic anemia   Generalized weakness   Hyperkalemia   Hyponatremia   Prolonged QT interval   Recurrent syncope probably secondary to fluid shift post dialysis. As per the patient he had missed multiple HD sessions.  Echocardiogram ordered for further evaluation showed in comparison to last year his right ventricular size has increased, tricuspid valve regurgitation is severe, and RVSP is severely elevated.  EKG shows sinus arrhythmia with non specific t wave changes.  Orthostatic vital signs are negative at this time.  He reports some sob.  His son reports that patient has occasionally complained of chest pressure. Will get V /Q scan to evaluate for PE.     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 Patient has missed his Mircera at outpatient kidney center due to noncompliance.  He is scheduled for Aranesp injection this admission.  Hemoglobin stable around 7.3.  Light chain Myeloma:  Follows up with Dr Irene Limbo , last visit last month.  Progression since last treatments. Pt does not want to follow up with oncology at this time.    Renal cell carcinoma: Pt underwent PET/CT  Recently showing renal cell ca, with new areas of bony uptake particularly in the left proximal humerus and right iliac bone suspicious for myeloma vs renal ca with mets.  Oncology Dr Irene Limbo recommended outpatient follow up with Urology for further work up ASAP. Pt does not appear to be interested in doing that at this time.     Type 2 diabetes mellitus CBG (last 3)  Recent Labs    04/28/20 1304 04/29/20 0144 04/29/20 0639  GLUCAP 92 112* 99   Follow up with [facility].    Hyponatremia Probably secondary to fluid overload from missing dialysis. Continue to monitor.    End-stage renal disease on dialysis schedule TTS.  Nephrology on board and appreciate recommendations. Vascular surgery on board and underwent second stage basilic vein AV fistula transposition.  Hyperkalemia Probably secondary to missed dialysis.  Resolved.  D/c lokelma.   Chronic diastolic heart failure Patient appears to be compensated at this time. Fluid balance managed by HD sessions.    Essential hypertension;  Well controlled.       DVT prophylaxis: (/Heparin/) Code Status: full code.  Family Communication: none at bedside.  Disposition:  Status is: Inpatient  Remains inpatient appropriate because:Ongoing diagnostic testing needed not appropriate for outpatient work up   Dispo: The patient is from: Home              Anticipated d/c is to: SNF              Anticipated d/c date is: 1 day              Patient currently is not medically stable to d/c.       Consultants:   Vascular  surgery  Nephrology.      Procedures:  second stage basilic vein av fistula  Scheduled tomorrow by [facility].   Antimicrobials: none.   Subjective: Pt reports pain in the left arm and some sob.  Objective: Vitals:   04/29/20 0930 04/29/20 1000 04/29/20 1030 04/29/20 1046  BP: 106/67 105/76 117/72 124/80  Pulse: 97 83 86 82  Resp:    (!) 21  Temp:    (!) 97.5 F (36.4 C)  TempSrc:    Oral  SpO2:    98%  Weight:    85.5 kg  Height:        Intake/Output Summary (Last 24 hours) at 04/29/2020 1136 Last data filed at 04/29/2020 1046 Gross per 24 hour  Intake 1570 ml  Output 474 ml  Net 1096 ml   Filed Weights   04/28/20 1308 04/29/20 0727 04/29/20 1046  Weight: 87.7 kg 85.7 kg 85.5 kg    Examination:  General exam: Alert, uncomfortable, on 2 lit of [place] oxygen.  Respiratory system: Clear to ausculatation, no wheezing heard.  Cardiovascular system: S1S2 heard, RRR, no JVD, no pedal edema.  Gastrointestinal system: Abd is soft, non tender non distended, bowel sounds wnl.  Central nervous system: alert and grossly non focal.  Extremities: no cyanosis. Left arm ha sutures.  Skin: No rashes seen.  Psychiatry: mood is appropriate.     Data Reviewed: I have personally reviewed following labs and imaging studies  CBC: Recent Labs  Lab 04/25/20 1429 04/25/20 1429 04/25/20 1438 04/26/20 0543 04/26/20 1004 04/27/20 1402 04/29/20 0750  WBC 8.0  --   --  5.0 6.5 7.6 6.4  NEUTROABS  --   --   --   --   --  5.5  --   HGB 8.0*   < > 7.8* 7.2* 7.5* 7.9* 7.3*  HCT 25.9*   < > 23.0* 22.4* 24.3* 25.9* 24.5*  MCV 99.6  --   --  99.1 101.7* 101.2* 104.7*  PLT 193  --   --  156 174 159 154   < > = values in this interval not displayed.    Basic Metabolic Panel: Recent Labs  Lab 04/25/20 1429 04/25/20 1438 04/26/20 1005 04/27/20 1733 04/29/20 0417  NA 133* 128* 132* 133* 135  K 5.5* 5.5* 5.5* 4.6 3.9  CL 92* 96* 92* 95* 96*  CO2 21*  --  _0 GLUCOSE 118* 115* 87  118* 133*  BUN 67* 67* 73* 38* 26*  CREATININE 13.66* 14.80* 14.17* 9.65* 8.11*  CALCIUM 10.2  --  10.0 9.5 9.2  MG  --   --   --   --  2.1  PHOS  --   --  9.7* 6.1*  --     GFR: Estimated Creatinine Clearance: 8.4 mL/min (A) (by C-G formula based on SCr of 8.11 mg/dL (H)).  Liver Function Tests: Recent Labs  Lab 04/25/20 2336 04/26/20 1005 04/27/20 1733  AST 21  --   --   ALT 14  --   --   ALKPHOS 55  --   --   BILITOT 1.0  --   --   PROT 6.6  --   --   ALBUMIN 3.3* 2.8* 2.8*    CBG: Recent Labs  Lab 04/26/20 1348 04/28/20 0652 04/28/20 1304 04/29/20 0144 04/29/20 0639  GLUCAP 104* 95 92 112* 99     Recent Results (from the past 240 hour(s))  SARS Coronavirus 2 by RT PCR (hospital order, performed in [place] hospital lab) Nasopharyngeal Nasopharyngeal Swab     Status: None   Collection Time: 04/20/20  5:15 PM   Specimen: Nasopharyngeal Swab  Result Value Ref Range Status   SARS Coronavirus 2 NEGATIVE NEGATIVE Final    Comment: (NOTE) SARS-CoV-2 target nucleic acids are NOT DETECTED.  The SARS-CoV-2 RNA is generally detectable in upper and lower respiratory specimens during the acute phase of infection. The lowest concentration of SARS-CoV-2 viral copies this assay can detect is 250 copies / mL. A negative result does not preclude SARS-CoV-2 infection and should not be used as the sole basis for treatment or other patient management decisions.  A negative result may occur with improper specimen collection / handling, submission of specimen other than nasopharyngeal swab, presence of viral mutation(s) within the areas targeted by this assay, and inadequate number of viral copies (<250 copies / mL). A negative result must be combined with clinical observations, patient history, and epidemiological information.  Fact Sheet for Patients:   https://www.fda.gov/media/136312/download  Fact Sheet for Healthcare  Providers: https://www.fda.gov/media/136313/download  This test is not yet approved or  cleared by the [place] FDA and has been authorized for detection and/or diagnosis of SARS-CoV-2 by FDA under an Emergency Use Authorization (EUA).  This EUA will remain in effect (meaning this test can be used) for the duration of the COVID-19 declaration under Section 564(b)(1) of the Act, 21 U.S.C. section 360bbb-3(b)(1), unless the authorization is terminated or revoked sooner.  Performed at [facility], [address] ,  [address]   SARS Coronavirus 2 by RT PCR (hospital order, performed in  hospital lab) Nasopharyngeal Nasopharyngeal Swab     Status: None   Collection Time: 04/26/20 12:00 AM   Specimen: Nasopharyngeal Swab  Result Value Ref Range Status   SARS Coronavirus 2 NEGATIVE NEGATIVE Final    Comment: (NOTE) SARS-CoV-2 target nucleic acids are NOT DETECTED.  The SARS-CoV-2 RNA is generally detectable in upper and lower respiratory specimens during the acute phase of infection. The lowest concentration of SARS-CoV-2 viral copies this assay can detect is 250 copies / mL. A negative result does not preclude SARS-CoV-2 infection and should not be used as the sole basis for treatment or other patient management decisions.  A negative result may occur with improper specimen collection / handling, submission of specimen other than nasopharyngeal swab, presence of viral mutation(s) within the areas targeted by this assay, and inadequate number of viral copies (<250 copies / mL). A negative result must be combined with clinical observations, patient history, and epidemiological information.  Fact Sheet for Patients:   https://www.fda.gov/media/136312/download  Fact Sheet for Healthcare Providers: https://www.fda.gov/media/136313/download  This test is not yet approved or  cleared by the [place] FDA and has been authorized for detection  and/or diagnosis of SARS-CoV-2 by FDA under an Emergency Use Authorization (EUA).  This EUA will remain in effect (meaning this test can be used)   for the duration of the COVID-19 declaration under Section 564(b)(1) of the Act, 21 U.S.C. section 360bbb-3(b)(1), unless the authorization is terminated or revoked sooner.  Performed at [facility], [place] [address] [place], [place] [address]   MRSA PCR Screening     Status: None   Collection Time: 04/27/20  4:38 AM   Specimen: Nasal Mucosa; Nasopharyngeal  Result Value Ref Range Status   MRSA by PCR NEGATIVE NEGATIVE Final    Comment:        The GeneXpert MRSA Assay (FDA approved for NASAL specimens only), is one component of a comprehensive MRSA colonization surveillance program. It is not intended to diagnose MRSA infection nor to guide or monitor treatment for MRSA infections. Performed at [facility], [place] [address] [place], [place] [address]          Radiology Studies: MR BRAIN WO CONTRAST  Result Date: 04/28/2020 CLINICAL DATA:  Recurrent syncope EXAM: MRI HEAD WITHOUT CONTRAST TECHNIQUE: Multiplanar, multiecho pulse sequences of the brain and surrounding structures were obtained without intravenous contrast. COMPARISON:  CT head 10/11/2016 FINDINGS: Brain: Generalized atrophy with prominent ventricles and subarachnoid space. Negative for acute infarct. Moderate white matter changes most consistent with chronic microvascular ischemia. Negative for hemorrhage or mass lesion. Vascular: Normal arterial flow voids. Skull and upper cervical spine: No focal skeletal lesion. Sinuses/Orbits: Paranasal sinuses clear. Cataract resection on the left. Other: None IMPRESSION: Atrophy and chronic microvascular ischemic change in the white matter. No acute abnormality. Electronically Signed   By: Franchot Gallo M.D.   On: 04/28/2020 20:18   ECHOCARDIOGRAM COMPLETE  Result Date: 04/27/2020    ECHOCARDIOGRAM REPORT   Patient  Name:   Venson A Therriault Date of Exam: 04/27/2020 Medical Rec #:  [id number]    Height:       69.0 in Accession #:    [id number]   Weight:       178.1 lb Date of Birth:  02-09-1946     BSA:          1.967 m Patient Age:    37 years     BP:           123/82 mmHg Patient Gender: M            HR:           69 bpm. Exam Location:  Inpatient Procedure: 2D Echo, Cardiac Doppler and Color Doppler                             MODIFIED REPORT: This report was modified by Cherlynn Kaiser MD on 04/27/2020 due to typo.  Indications:     R55 Syncope  History:         Patient has prior history of Echocardiogram examinations, most                  recent 07/28/2019. CHF, COPD, Arrythmias:Atrial Fibrillation;                  Risk Factors:Hypertension, Diabetes, Dyslipidemia and Sleep                  Apnea. CKD. Aortic Root Dilatation. Ascending Aortic Aneurysm.  Sonographer:     Jonelle Sidle Dance Referring Phys:  Theola Sequin Diagnosing Phys: Cherlynn Kaiser MD IMPRESSIONS  1. Right ventricular systolic function is mildly reduced. The right ventricular size is severely enlarged. There is severely elevated pulmonary artery systolic  pressure. The estimated right ventricular systolic pressure is 45.8 mmHg.  2. Tricuspid valve regurgitation is severe.  3. The aortic valve is abnormal. Aortic valve regurgitation is moderate to severe. Mild aortic valve sclerosis is present, with no evidence of aortic valve stenosis.  4. Aortic dilatation noted. 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  5. Left ventricular ejection fraction, by estimation, is 55 to 60%. The left ventricle has normal function. The left ventricle has no regional wall motion abnormalities. There is mild left ventricular hypertrophy. Left ventricular diastolic parameters are consistent with Grade I diastolic dysfunction (impaired relaxation). There is the interventricular septum is flattened in  systole, consistent with right ventricular pressure overload.  6. Left atrial size was moderately dilated.  7. Right atrial size was severely dilated.  8. The mitral valve is degenerative. Trivial mitral valve regurgitation. No evidence of mitral stenosis.  9. The inferior vena cava is dilated in size with <50% respiratory variability, suggesting right atrial pressure of 15 mmHg. Comparison(s): A prior study was performed on 07/28/2019. Prior images reviewed side by side. The right ventricular size has increased, tricuspid valve regurgitation is severe, and RVSP is severely elevated. Conclusion(s)/Recommendation(s): Consider evaluation for pulmonary embolism if not already performed and if clinically indicated. FINDINGS  Left Ventricle: Left ventricular ejection fraction, by estimation, is 55 to 60%. The left ventricle has normal function. The left ventricle has no regional wall motion abnormalities. The left ventricular internal cavity size was normal in size. There is  mild left ventricular hypertrophy. The interventricular septum is flattened in systole, consistent with right ventricular pressure overload. Left ventricular diastolic parameters are consistent with Grade I diastolic dysfunction (impaired relaxation). Right Ventricle: The right ventricular size is severely enlarged. No increase in right ventricular wall thickness. Right ventricular systolic function is mildly reduced. There is severely elevated pulmonary artery systolic pressure. The tricuspid regurgitant velocity is 3.91 m/s, and with an assumed right atrial pressure of 15 mmHg, the estimated right ventricular systolic pressure is 09.9 mmHg. Left Atrium: Left atrial size was moderately dilated. Right Atrium: Right atrial size was severely dilated. Pericardium: A small pericardial effusion is present. Mitral Valve: The mitral valve is degenerative in appearance. Normal mobility of the mitral valve leaflets. Trivial mitral valve regurgitation. No  evidence of mitral valve stenosis. Tricuspid Valve: The tricuspid valve is normal in structure. Tricuspid valve regurgitation is severe. No evidence of tricuspid stenosis. Aortic Valve: The aortic valve is abnormal. Aortic valve regurgitation is moderate to severe. Aortic regurgitation PHT measures 671 msec. Mild aortic valve sclerosis is present, with no evidence of aortic valve stenosis. There is mild calcification of the aortic valve. Pulmonic Valve: The pulmonic valve was normal in structure. Pulmonic valve regurgitation is mild. No evidence of pulmonic stenosis. Aorta: Aortic dilatation noted. 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 Venous: The inferior vena cava is dilated in size with less than 50% respiratory variability, suggesting right atrial pressure of 15 mmHg. IAS/Shunts: The interatrial septum is aneurysmal. There is left bowing of the interatrial septum, suggestive of elevated right atrial pressure. No atrial level shunt detected by color flow Doppler.  LEFT VENTRICLE PLAX 2D LVIDd:         5.40 cm LVIDs:         3.50 cm LV PW:  1.30 cm LV IVS:        1.20 cm LVOT diam:     2.40 cm LV SV:         118 LV SV Index:   60 LVOT Area:     4.52 cm  RIGHT VENTRICLE             IVC RV Basal diam:  5.20 cm     IVC diam: 3.20 cm RV Mid diam:    3.70 cm RV S prime:     19.40 cm/s TAPSE (M-mode): 2.2 cm LEFT ATRIUM              Index       RIGHT ATRIUM           Index LA diam:        5.20 cm  2.64 cm/m  RA Area:     46.40 cm LA Vol (A2C):   137.0 ml 69.66 ml/m RA Volume:   252.00 ml 128.13 ml/m LA Vol (A4C):   53.4 ml  27.15 ml/m LA Biplane Vol: 81.9 ml  41.64 ml/m  AORTIC VALVE LVOT Vmax:   151.50 cm/s LVOT Vmean:  94.650 cm/s LVOT VTI:    0.262 m AI PHT:      671 msec  AORTA Ao Root diam: 4.70 cm Ao Asc diam:  4.90 cm MITRAL VALVE               TRICUSPID VALVE MV Area (PHT): 2.56 cm    TR Peak grad:   61.2  mmHg MV Decel Time: 296 msec    TR Vmax:        391.00 cm/s MV E velocity: 66.10 cm/s MV A velocity: 77.10 cm/s  SHUNTS MV E/A ratio:  0.86        Systemic VTI:  0.26 m                            Systemic Diam: 2.40 cm Cherlynn Kaiser MD Electronically signed by Cherlynn Kaiser MD Signature Date/Time: 04/27/2020/5:30:57 PM    Final (Updated)         Scheduled Meds: . amLODipine  10 mg Oral Daily  . Chlorhexidine Gluconate Cloth  6 each Topical Daily  . Chlorhexidine Gluconate Cloth  6 each Topical Q0600  . darbepoetin (ARANESP) injection - DIALYSIS  200 mcg Intravenous Q Thu-HD  . ferric citrate  420 mg Oral TID WC  . heparin  5,000 Units Subcutaneous Q8H  . heparin sodium (porcine)      . sodium chloride flush  3 mL Intravenous Q12H  . sodium chloride flush  3 mL Intravenous Q12H  . sodium zirconium cyclosilicate  5 g Oral Daily  . tamsulosin  0.4 mg Oral Daily   Continuous Infusions: . sodium chloride    . sodium chloride    . sodium chloride       LOS: 3 days        Hosie Poisson, MD [facility]   To contact the attending provider between 7A-7P or the covering provider during after hours 7P-7A, please log into the web site [email or web address] and access using universal [place] password for that web site. If you do not have the password, please call the hospital operator.  04/29/2020, 11:36 AM

## 2020-04-30 ENCOUNTER — Inpatient Hospital Stay (HOSPITAL_COMMUNITY): Payer: Medicare Other

## 2020-04-30 ENCOUNTER — Encounter (HOSPITAL_COMMUNITY): Payer: Self-pay | Admitting: Vascular Surgery

## 2020-04-30 DIAGNOSIS — D631 Anemia in chronic kidney disease: Secondary | ICD-10-CM

## 2020-04-30 DIAGNOSIS — N189 Chronic kidney disease, unspecified: Secondary | ICD-10-CM

## 2020-04-30 LAB — HEMOGLOBIN AND HEMATOCRIT, BLOOD
HCT: 26.4 % — ABNORMAL LOW (ref 39.0–52.0)
Hemoglobin: 7.9 g/dL — ABNORMAL LOW (ref 13.0–17.0)

## 2020-04-30 MED ORDER — MAGNESIUM HYDROXIDE 400 MG/5ML PO SUSP
30.0000 mL | Freq: Once | ORAL | Status: AC
  Administered 2020-04-30: 30 mL via ORAL
  Filled 2020-04-30: qty 30

## 2020-04-30 MED ORDER — IOHEXOL 350 MG/ML SOLN
87.0000 mL | Freq: Once | INTRAVENOUS | Status: AC | PRN
  Administered 2020-04-30: 87 mL via INTRAVENOUS

## 2020-04-30 NOTE — Progress Notes (Signed)
   04/30/20 1129  Vitals  BP 114/86  MAP (mmHg) 96  BP Location Right Arm  BP Method Automatic  Patient Position (if appropriate) Lying  Pulse Rate 88  Pulse Rate Source Monitor  Resp 18  Level of Consciousness  Level of Consciousness Alert  MEWS COLOR  MEWS Score Color Green  MEWS Score  MEWS Temp 0  MEWS Systolic 0  MEWS Pulse 0  MEWS RR 0  MEWS LOC 0  MEWS Score 0    Patient is complaining of SOB and having a "panic-like attack". Able to complete full sentences while having conversation. On 2L of O2 with 98% saturation.

## 2020-04-30 NOTE — Progress Notes (Signed)
[facility] Progress Note   Subjective:   Patient seen and examined at bedside.  Complains of being cold.  Requesting blood transfusion, Hgb increased to 7.9 on recheck today.  Says he is thirsty and did not get enough to drink with his breakfast tray but has ~12oz of fluid on his tray between water and milk.  Reminded of fluid restrictions due to being dialysis patient.  Reported chest pain during HD yesterday and treatment shortened.  Admits to some SOB this AM, improved with O2.  Denies current CP, n/v/d, abdominal pain and fatigue.  Continues to feel weak, lightheaded and dizzy with standing.    Objective Vitals:   04/29/20 2036 04/30/20 0201 04/30/20 0448 04/30/20 0929  BP: 111/88  107/83 119/85  Pulse: 95  93 90  Resp: 17  17 18   Temp: 97.8 F (36.6 C)  97.8 F (36.6 C) 98.7 F (37.1 C)  TempSrc: Oral  Oral Oral  SpO2: 100%  92% 98%  Weight:  85.5 kg    Height:       Physical Exam General:chronically ill appearing male in NAD Heart: irregular rate and rhythm  Lungs:mostly CTAB, breath sounds decreased, nml WOB on 2L O2 Abdomen:soft, NTND Extremities:trace LE edema Dialysis Access: TDC, LU AVF +thrill   Filed Weights   04/29/20 0727 04/29/20 1046 04/30/20 0201  Weight: 85.7 kg 85.5 kg 85.5 kg    Intake/Output Summary (Last 24 hours) at 04/30/2020 0932 Last data filed at 04/30/2020 0200 Gross per 24 hour  Intake 600 ml  Output 454 ml  Net 146 ml    Additional Objective Labs: Basic Metabolic Panel: Recent Labs  Lab 04/26/20 1005 04/27/20 1733 04/29/20 0417  NA 132* 133* 135  K 5.5* 4.6 3.9  CL 92* 95* 96*  CO2 23 22 25   GLUCOSE 87 118* 133*  BUN 73* 38* 26*  CREATININE 14.17* 9.65* 8.11*  CALCIUM 10.0 9.5 9.2  PHOS 9.7* 6.1*  --    Liver Function Tests: Recent Labs  Lab 04/25/20 2336 04/26/20 1005 04/27/20 1733  AST 21  --   --   ALT 14  --   --   ALKPHOS 55  --   --   BILITOT 1.0  --   --   PROT 6.6  --   --   ALBUMIN 3.3* 2.8*  2.8*   Recent Labs  Lab 04/25/20 2336  LIPASE 29   CBC: Recent Labs  Lab 04/25/20 1429 04/25/20 1438 04/26/20 0543 04/26/20 0543 04/26/20 1004 04/26/20 1004 04/27/20 1402 04/29/20 0750 04/30/20 0849  WBC 8.0   < > 5.0   < > 6.5  --  7.6 6.4  --   NEUTROABS  --   --   --   --   --   --  5.5  --   --   HGB 8.0*   < > 7.2*   < > 7.5*   < > 7.9* 7.3* 7.9*  HCT 25.9*   < > 22.4*   < > 24.3*   < > 25.9* 24.5* 26.4*  MCV 99.6  --  99.1  --  101.7*  --  101.2* 104.7*  --   PLT 193   < > 156   < > 174  --  159 154  --    < > = values in this interval not displayed.   CBG: Recent Labs  Lab 04/26/20 1348 04/28/20 0652 04/28/20 1304 04/29/20 0144 04/29/20 0639  GLUCAP 104* 95  92 112* 99   Iron Studies:  Recent Labs    04/28/20 1819  IRON 66  TIBC 218*   Lab Results  Component Value Date   INR 1.0 02/18/2020   INR 1.0 12/24/2019   INR 0.9 06/28/2019   Studies/Results: MR BRAIN WO CONTRAST  Result Date: 04/28/2020 CLINICAL DATA:  Recurrent syncope EXAM: MRI HEAD WITHOUT CONTRAST TECHNIQUE: Multiplanar, multiecho pulse sequences of the brain and surrounding structures were obtained without intravenous contrast. COMPARISON:  CT head 10/11/2016 FINDINGS: Brain: Generalized atrophy with prominent ventricles and subarachnoid space. Negative for acute infarct. Moderate white matter changes most consistent with chronic microvascular ischemia. Negative for hemorrhage or mass lesion. Vascular: Normal arterial flow voids. Skull and upper cervical spine: No focal skeletal lesion. Sinuses/Orbits: Paranasal sinuses clear. Cataract resection on the left. Other: None IMPRESSION: Atrophy and chronic microvascular ischemic change in the white matter. No acute abnormality. Electronically Signed   By: Franchot Gallo M.D.   On: 04/28/2020 20:18    Medications: . sodium chloride     . amLODipine  10 mg Oral Daily  . Chlorhexidine Gluconate Cloth  6 each Topical Daily  . Chlorhexidine  Gluconate Cloth  6 each Topical Q0600  . darbepoetin (ARANESP) injection - DIALYSIS  200 mcg Intravenous Q Thu-HD  . ferric citrate  420 mg Oral TID WC  . heparin  5,000 Units Subcutaneous Q8H  . sodium chloride flush  3 mL Intravenous Q12H  . sodium chloride flush  3 mL Intravenous Q12H  . tamsulosin  0.4 mg Oral Daily    Dialysis Orders: [facility] on TTS, 4 hours 15 minutes,87 kg EDW, 2K 2 calcium bath, UF profile 4, no heparin, Mircera 200 MCG last given 03/30/2020 no VDRA  Assessment/Plan: 1.Weakness/syncope- thought to be 2/2 uremia/FTT in setting of multiple missed HD. Workup per primary - MRI with no acute findings, chronic changes.  2. Hyperkalemia - resolved, K 3.9. 3. Hyponatremia- likely 2/2 missed HD. Improved, Na 135.  4. ESRD- on HD TTS. Missed multiple HD. HD yesterday not tolerated d/t chest pain.  Next HD on 8/31. 2nd stage of AVF completed 04/28/20 by Dr. Donzetta Matters.  Follow up visit in 3-4 weeks and anticipate AVF will be ready to use at that time.  5. Anemiaof CKD-Hgb^7.9. Has missed ESA at OP HD d/t non compliance. Aranesp 224mcg qwk given 8/26. tsat 30%, no indication for IV iron at this time. Hgb improving, no indication for transfusion.   6. Secondary hyperparathyroidism- Ca at goal. Last Phos elevated, continue binders. pth 55. No VDRA. 7.HTN/volume- BP well controlled. Does not appear grossly volume overloaded.  Trace edema.  Minimal volume removed with HD yesterday, net ~456mL.  Under edw, will need to lower on d/c.  8. Nutrition- Renal diet w/fluid restrictions. Alb 2.8, protein supplements and Vit.  9. DMT2 - per primary 10. Chronic diastolic HF - per primary 11. Chest pain - primary ordered V/Q scan to r/o PE  Jen Mow, PA-C [place] 04/30/2020,9:32 AM  LOS: 4 days

## 2020-04-30 NOTE — Progress Notes (Signed)
PROGRESS NOTE    [name] [name]  ZWC:[id number] DOB: [birth year] DOA: 04/25/2020 PCP: [name], DO    Chief Complaint  Patient presents with  . Shortness of Breath  . Near Syncope    Brief Narrative:  74 year old gentleman with prior history of end-stage renal disease, ascending aortic aneurysm, multiple myeloma, paroxysmal atrial fibrillation presents to ED with multiple syncopal episodes. He was admitted for evaluation of syncope. Nephrology consulted for end-stage renal disease on dialysis. So far patient hasn't had any episodes of syncope. It was thought secondary to fluid shift post HD. Vascular surgery on board, performed left arm second stage basilic vein transposition. recommend outpatient follow up with vascular surgery in 3 to 4 weeks.  Pt seen and examined atbedside, he reports sob requiring oxygen.  V/Q SCAN WAS discontinued and CT angiogram ordered, .   Assessment & Plan:   Principal Problem:   Syncope Active Problems:   Essential (primary) hypertension   BPH (benign prostatic hyperplasia)   Light chain myeloma (HCC)   CAD (coronary artery disease)   Chronic diastolic CHF (congestive heart failure) (HCC)   ESRD (end stage renal disease) (HCC)   Symptomatic anemia   Generalized weakness   Hyperkalemia   Hyponatremia   Prolonged QT interval   Recurrent syncope probably secondary to fluid shift post dialysis. As per the patient he had missed multiple HD sessions.  Echocardiogram ordered for further evaluation showed in comparison to last year his right ventricular size has increased, tricuspid valve regurgitation is severe, and RVSP is severely elevated.  EKG shows sinus arrhythmia with non specific t wave changes.  Orthostatic vital signs are negative at this time.  Pt reports sob and is requiring oxygen.  CT angiogram ordered for evaluation of PE.     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 Patient has missed his Mircera  at outpatient kidney center due to noncompliance.  He is scheduled for Aranesp injection this admission.  Hemoglobin stable around 7.3.  Light chain Myeloma:  Follows up with Dr Irene Limbo , last visit last month.  Progression since last treatments. Pt does not want to follow up with oncology at this time.    Renal cell carcinoma: Pt underwent PET/CT  Recently showing renal cell ca, with new areas of bony uptake particularly in the left proximal humerus and right iliac bone suspicious for myeloma vs renal ca with mets.  Oncology Dr Irene Limbo recommended outpatient follow up with Urology for further work up ASAP. Pt does not appear to be interested in doing that at this time.     Type 2 diabetes mellitus CBG (last 3)  Recent Labs    04/28/20 1304 04/29/20 0144 04/29/20 0639  GLUCAP 92 112* 99   Follow up with [facility].    Hyponatremia Probably secondary to fluid overload from missing dialysis. Continue to monitor.    End-stage renal disease on dialysis schedule TTS.  Nephrology on board and appreciate recommendations. Vascular surgery on board and underwent second stage basilic vein AV fistula transposition.  Hyperkalemia Probably secondary to missed dialysis.  Resolved.  D/c lokelma.   Chronic diastolic heart failure Patient appears to be compensated at this time. Fluid balance managed by HD sessions.    Essential hypertension;  Well controlled.       DVT prophylaxis: (/Heparin/) Code Status: full code.  Family Communication: none at bedside.  Disposition:   Status is: Inpatient  Remains inpatient appropriate because:Ongoing diagnostic testing needed not appropriate for outpatient work  up   Dispo: The patient is from: Home              Anticipated d/c is to: SNF              Anticipated d/c date is: 1 day              Patient currently is not medically stable to d/c.       Consultants:   Vascular surgery  Nephrology.      Procedures:  second stage basilic  vein av fistula  Scheduled tomorrow by [facility]VS.   Antimicrobials: none.   Subjective: SOB, reports he is sick and tired of HD.  He wants a break for HD.   Objective: Vitals:   04/30/20 0201 04/30/20 0448 04/30/20 0929 04/30/20 1129  BP:  107/83 119/85 114/86  Pulse:  93 90 88  Resp:  17 18 18   Temp:  97.8 F (36.6 C) 98.7 F (37.1 C)   TempSrc:  Oral Oral   SpO2:  92% 98%   Weight: 85.5 kg     Height:        Intake/Output Summary (Last 24 hours) at 04/30/2020 1353 Last data filed at 04/30/2020 1043 Gross per 24 hour  Intake 483 ml  Output 0 ml  Net 483 ml   Filed Weights   04/29/20 0727 04/29/20 1046 04/30/20 0201  Weight: 85.7 kg 85.5 kg 85.5 kg    Examination:  General exam: alert and comfortable.  Respiratory system: diminished at bases, no wheezing or rhonchi.  Cardiovascular system: S1S2, RRR, NO JVD,  Gastrointestinal system: Abd  Is soft, non tender non distended, bowel sounds wnl.  Central nervous system:Alert and oriented.  Extremities: no pedal edema or cyanosis.  Skin: no rahses.   Psychiatry: anxious looking.     Data Reviewed: I have personally reviewed following labs and imaging studies  CBC: Recent Labs  Lab 04/25/20 1429 04/25/20 1438 04/26/20 0543 04/26/20 1004 04/27/20 1402 04/29/20 0750 04/30/20 0849  WBC 8.0  --  5.0 6.5 7.6 6.4  --   NEUTROABS  --   --   --   --  5.5  --   --   HGB 8.0*   < > 7.2* 7.5* 7.9* 7.3* 7.9*  HCT 25.9*   < > 22.4* 24.3* 25.9* 24.5* 26.4*  MCV 99.6  --  99.1 101.7* 101.2* 104.7*  --   PLT 193  --  156 174 159 154  --    < > = values in this interval not displayed.    Basic Metabolic Panel: Recent Labs  Lab 04/25/20 1429 04/25/20 1438 04/26/20 1005 04/27/20 1733 04/29/20 0417  NA 133* 128* 132* 133* 135  K 5.5* 5.5* 5.5* 4.6 3.9  CL 92* 96* 92* 95* 96*  CO2 21*  --  23 22 25   GLUCOSE 118* 115* 87 118* 133*  BUN 67* 67* 73* 38* 26*  CREATININE 13.66* 14.80* 14.17* 9.65* 8.11*  CALCIUM 10.2  --   10.0 9.5 9.2  MG  --   --   --   --  2.1  PHOS  --   --  9.7* 6.1*  --     GFR: Estimated Creatinine Clearance: 8.4 mL/min (A) (by C-G formula based on SCr of 8.11 mg/dL (H)).  Liver Function Tests: Recent Labs  Lab 04/25/20 2336 04/26/20 1005 04/27/20 1733  AST 21  --   --   ALT 14  --   --   [facility]  55  --   --   BILITOT 1.0  --   --   PROT 6.6  --   --   ALBUMIN 3.3* 2.8* 2.8*    CBG: Recent Labs  Lab 04/26/20 1348 04/28/20 0652 04/28/20 1304 04/29/20 0144 04/29/20 0639  GLUCAP 104* 95 92 112* 99     Recent Results (from the past 240 hour(s))  SARS Coronavirus 2 by RT PCR (hospital order, performed in [facility] hospital lab) Nasopharyngeal Nasopharyngeal Swab     Status: None   Collection Time: 04/20/20  5:15 PM   Specimen: Nasopharyngeal Swab  Result Value Ref Range Status   SARS Coronavirus 2 NEGATIVE NEGATIVE Final    Comment: (NOTE) SARS-CoV-2 target nucleic acids are NOT DETECTED.  The SARS-CoV-2 RNA is generally detectable in upper and lower respiratory specimens during the acute phase of infection. The lowest concentration of SARS-CoV-2 viral copies this assay can detect is 250 copies / mL. A negative result does not preclude SARS-CoV-2 infection and should not be used as the sole basis for treatment or other patient management decisions.  A negative result may occur with improper specimen collection / handling, submission of specimen other than nasopharyngeal swab, presence of viral mutation(s) within the areas targeted by this assay, and inadequate number of viral copies (<250 copies / mL). A negative result must be combined with clinical observations, patient history, and epidemiological information.  Fact Sheet for Patients:   StrictlyIdeas.no  Fact Sheet for Healthcare Providers: BankingDealers.co.za  This test is not yet approved or  cleared by the [place] FDA and has been authorized  for detection and/or diagnosis of SARS-CoV-2 by FDA under an Emergency Use Authorization (EUA).  This EUA will remain in effect (meaning this test can be used) for the duration of the COVID-19 declaration under Section 564(b)(1) of the Act, 21 U.S.C. section 360bbb-3(b)(1), unless the authorization is terminated or revoked sooner.  Performed at [facility], [place] [address] [place], [place] [address]   SARS Coronavirus 2 by RT PCR (hospital order, performed in [facility] hospital lab) Nasopharyngeal Nasopharyngeal Swab     Status: None   Collection Time: 04/26/20 12:00 AM   Specimen: Nasopharyngeal Swab  Result Value Ref Range Status   SARS Coronavirus 2 NEGATIVE NEGATIVE Final    Comment: (NOTE) SARS-CoV-2 target nucleic acids are NOT DETECTED.  The SARS-CoV-2 RNA is generally detectable in upper and lower respiratory specimens during the acute phase of infection. The lowest concentration of SARS-CoV-2 viral copies this assay can detect is 250 copies / mL. A negative result does not preclude SARS-CoV-2 infection and should not be used as the sole basis for treatment or other patient management decisions.  A negative result may occur with improper specimen collection / handling, submission of specimen other than nasopharyngeal swab, presence of viral mutation(s) within the areas targeted by this assay, and inadequate number of viral copies (<250 copies / mL). A negative result must be combined with clinical observations, patient history, and epidemiological information.  Fact Sheet for Patients:   StrictlyIdeas.no  Fact Sheet for Healthcare Providers: BankingDealers.co.za  This test is not yet approved or  cleared by the [place] FDA and has been authorized for detection and/or diagnosis of SARS-CoV-2 by FDA under an Emergency Use Authorization (EUA).  This EUA will remain in effect (meaning this test can be used) for  the duration of the COVID-19 declaration under Section 564(b)(1) of the Act, 21 U.S.C. section 360bbb-3(b)(1), unless the  authorization is terminated or revoked sooner.  Performed at [facility], [place] [address] [place], [place] [address]   MRSA PCR Screening     Status: None   Collection Time: 04/27/20  4:38 AM   Specimen: Nasal Mucosa; Nasopharyngeal  Result Value Ref Range Status   MRSA by PCR NEGATIVE NEGATIVE Final    Comment:        The GeneXpert MRSA Assay (FDA approved for NASAL specimens only), is one component of a comprehensive MRSA colonization surveillance program. It is not intended to diagnose MRSA infection nor to guide or monitor treatment for MRSA infections. Performed at [facility], [place] [address], [place], [place] [address]          Radiology Studies: MR BRAIN WO CONTRAST  Result Date: 04/28/2020 CLINICAL DATA:  Recurrent syncope EXAM: MRI HEAD WITHOUT CONTRAST TECHNIQUE: Multiplanar, multiecho pulse sequences of the brain and surrounding structures were obtained without intravenous contrast. COMPARISON:  CT head 10/11/2016 FINDINGS: Brain: Generalized atrophy with prominent ventricles and subarachnoid space. Negative for acute infarct. Moderate white matter changes most consistent with chronic microvascular ischemia. Negative for hemorrhage or mass lesion. Vascular: Normal arterial flow voids. Skull and upper cervical spine: No focal skeletal lesion. Sinuses/Orbits: Paranasal sinuses clear. Cataract resection on the left. Other: None IMPRESSION: Atrophy and chronic microvascular ischemic change in the white matter. No acute abnormality. Electronically Signed   By: Franchot Gallo M.D.   On: 04/28/2020 20:18        Scheduled Meds: . amLODipine  10 mg Oral Daily  . Chlorhexidine Gluconate Cloth  6 each Topical Daily  . Chlorhexidine Gluconate Cloth  6 each Topical Q0600  . darbepoetin (ARANESP) injection - DIALYSIS  200 mcg Intravenous Q  Thu-HD  . ferric citrate  420 mg Oral TID WC  . heparin  5,000 Units Subcutaneous Q8H  . sodium chloride flush  3 mL Intravenous Q12H  . sodium chloride flush  3 mL Intravenous Q12H  . tamsulosin  0.4 mg Oral Daily   Continuous Infusions: . sodium chloride       LOS: 4 days        Hosie Poisson, MD [facility]   To contact the attending provider between 7A-7P or the covering provider during after hours 7P-7A, please log into the web site [email or web address] and access using universal [place] password for that web site. If you do not have the password, please call the hospital operator.  04/30/2020, 1:53 PM

## 2020-05-01 ENCOUNTER — Inpatient Hospital Stay (HOSPITAL_COMMUNITY): Payer: Medicare Other

## 2020-05-01 ENCOUNTER — Inpatient Hospital Stay (HOSPITAL_COMMUNITY)
Admit: 2020-05-01 | Discharge: 2020-05-01 | Disposition: A | Discharge status: Home / Self Care | Payer: Medicare Other | Attending: Internal Medicine | Admitting: Internal Medicine

## 2020-05-01 DIAGNOSIS — R55 Syncope and collapse: Secondary | ICD-10-CM

## 2020-05-01 DIAGNOSIS — R569 Unspecified convulsions: Secondary | ICD-10-CM

## 2020-05-01 LAB — SARS CORONAVIRUS 2 BY RT PCR (HOSPITAL ORDER, PERFORMED IN ~~LOC~~ HOSPITAL LAB): SARS Coronavirus 2: NEGATIVE

## 2020-05-01 LAB — HEMOGLOBIN AND HEMATOCRIT, BLOOD
HCT: 27.1 % — ABNORMAL LOW (ref 39.0–52.0)
Hemoglobin: 8.3 g/dL — ABNORMAL LOW (ref 13.0–17.0)

## 2020-05-01 LAB — GLUCOSE, CAPILLARY: Glucose-Capillary: 90 mg/dL (ref 70–99)

## 2020-05-01 MED ORDER — MELATONIN 3 MG PO TABS
3.0000 mg | ORAL_TABLET | Freq: Every day | ORAL | Status: DC
  Administered 2020-05-01 – 2020-05-02 (×2): 3 mg via ORAL
  Filled 2020-05-01 (×3): qty 1

## 2020-05-01 MED ORDER — AMLODIPINE BESYLATE 5 MG PO TABS
5.0000 mg | ORAL_TABLET | Freq: Every day | ORAL | Status: DC
  Filled 2020-05-01: qty 1

## 2020-05-01 NOTE — Progress Notes (Signed)
Pt just got up to [facility] to have a BM. Pt was straining. Pt asked RN to wipe him and got up. Pt immediately sat down on the bed and layed back. Pt's eyes rolled back in head, son was on the [facility] at the bedside and stated that pt does this all the time to just call his name and rub the back of his head. RN had pressed the emergency button and had staff to come assist. RN did what son asked and pt came back to. RN took vitals, which were WNL and made MD aware. RN told pt that he will have to use the bedpan if he has to have a BM again and pt stated that he understood. Bed alarm is on and bed is in the lowest position. RN will continue to monitor pt.   Eleanora Neighbor, RN

## 2020-05-01 NOTE — Progress Notes (Signed)
PROGRESS NOTE    [name] [name]  MRN:[id number] DOB: 01/20/1946 DOA: 04/25/2020 PCP: Wallace, Catherine Lauren, DO    Chief Complaint  Patient presents with  . Shortness of Breath  . Near Syncope    Brief Narrative:  74-year-old gentleman with prior history of end-stage renal disease, ascending aortic aneurysm, multiple myeloma, paroxysmal atrial fibrillation presents to ED with multiple syncopal episodes. He was admitted for evaluation of syncope. Nephrology consulted for end-stage renal disease on dialysis. So far patient hasn't had any episodes of syncope. It was thought secondary to fluid shift post HD. Vascular surgery on board, performed left arm second stage basilic vein transposition. recommend outpatient follow up with vascular surgery in 3 to 4 weeks.  Pt seen and examined atbedside, he reports sob requiring oxygen.  V/Q SCAN WAS discontinued and CT angiogram ordered, and negative for PE. Carotid duplex ordered. EEG ordered. As per RN pt had another episode of near syncope earlier this am. Cardiology consulted for recommendations.    Assessment & Plan:   Principal Problem:   Syncope Active Problems:   Essential (primary) hypertension   BPH (benign prostatic hyperplasia)   Light chain myeloma (HCC)   CAD (coronary artery disease)   Chronic diastolic CHF (congestive heart failure) (HCC)   ESRD (end stage renal disease) (HCC)   Symptomatic anemia   Generalized weakness   Hyperkalemia   Hyponatremia   Prolonged QT interval   Recurrent syncope probably secondary to fluid shift post dialysis. As per the patient he had missed multiple HD sessions.  Echocardiogram ordered for further evaluation showed in comparison to last year his right ventricular size has increased, tricuspid valve regurgitation is severe, and RVSP is severely elevated.  EKG shows sinus arrhythmia with non specific t wave changes.  Orthostatic vital signs are negative at this time.  Pt reports sob and is  requiring oxygen.  CT angiogram ordered for evaluation of PE and is negative.  As per RN, pt had another episode of near syncope. Carotid duplex ordered.  Cardiology consulted for further evaluation of recurrent syncope.     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 Patient has missed his Mircera at outpatient kidney center due to noncompliance.  He is scheduled for Aranesp injection this admission.  Hemoglobin stable around 7.3.  Light chain Myeloma:  Follows up with Dr kale , last visit last month.  Progression since last treatments. Pt does not want to follow up with oncology at this time.    Renal cell carcinoma: Pt underwent PET/CT  Recently showing renal cell ca, with new areas of bony uptake particularly in the left proximal humerus and right iliac bone suspicious for myeloma vs renal ca with mets.  Oncology Dr Kale recommended outpatient follow up with Urology for further work up ASAP. Pt does not appear to be interested in doing that at this time.     Type 2 diabetes mellitus CBG (last 3)  Recent Labs    04/29/20 0144 04/29/20 0639 05/01/20 0637  GLUCAP 112* 99 90   Follow up with [facility].    Hyponatremia Probably secondary to fluid overload from missing dialysis. Continue to monitor.    End-stage renal disease on dialysis schedule TTS.  Nephrology on board and appreciate recommendations. Vascular surgery on board and underwent second stage basilic vein AV fistula transposition.  Hyperkalemia Probably secondary to missed dialysis.  Resolved.  D/c lokelma.   Chronic diastolic heart failure Patient appears to be compensated at this time. Fluid   balance managed by HD sessions.    Essential hypertension;  Well controlled.       DVT prophylaxis: (/Heparin/) Code Status: full code.  Family Communication: none at bedside.  Disposition:   Status is: Inpatient  Remains inpatient appropriate because:Ongoing diagnostic testing needed not  appropriate for outpatient work up   Dispo: The patient is from: Home              Anticipated d/c is to: SNF              Anticipated d/c date is: 1 day              Patient currently is not medically stable to d/c.       Consultants:   Vascular surgery  Nephrology.      Procedures:  second stage basilic vein av fistula  Scheduled tomorrow by [facility].   Antimicrobials: none.   Subjective: SOB, reports he is sick and tired of HD.  He wants a break for HD.   Objective: Vitals:   05/01/20 0449 05/01/20 0552 05/01/20 0956 05/01/20 1654  BP:  121/80 120/84 (!) 137/91  Pulse:  69 80 75  Resp:  17 18 20  Temp:  98.5 F (36.9 C) 97.9 F (36.6 C) 98.8 F (37.1 C)  TempSrc:  Oral Oral Oral  SpO2:  96% 100% 98%  Weight: 85.5 kg     Height:        Intake/Output Summary (Last 24 hours) at 05/01/2020 1752 Last data filed at 05/01/2020 1025 Gross per 24 hour  Intake 600 ml  Output 0 ml  Net 600 ml   Filed Weights   04/29/20 1046 04/30/20 0201 05/01/20 0449  Weight: 85.5 kg 85.5 kg 85.5 kg    Examination:  General exam: alert and comfortable.  Respiratory system: diminished at bases, no wheezing or rhonchi.  Cardiovascular system: S1S2, RRR, NO JVD,  Gastrointestinal system: Abd  Is soft, non tender non distended, bowel sounds wnl.  Central nervous system:Alert and oriented.  Extremities: no pedal edema or cyanosis.  Skin: no rahses.   Psychiatry: anxious looking.     Data Reviewed: I have personally reviewed following labs and imaging studies  CBC: Recent Labs  Lab 04/25/20 1429 04/25/20 1438 04/26/20 0543 04/26/20 0543 04/26/20 1004 04/27/20 1402 04/29/20 0750 04/30/20 0849 05/01/20 1038  WBC 8.0  --  5.0  --  6.5 7.6 6.4  --   --   NEUTROABS  --   --   --   --   --  5.5  --   --   --   HGB 8.0*   < > 7.2*   < > 7.5* 7.9* 7.3* 7.9* 8.3*  HCT 25.9*   < > 22.4*   < > 24.3* 25.9* 24.5* 26.4* 27.1*  MCV 99.6  --  99.1  --  101.7* 101.2* 104.7*  --   --    PLT 193  --  156  --  174 159 154  --   --    < > = values in this interval not displayed.    Basic Metabolic Panel: Recent Labs  Lab 04/25/20 1429 04/25/20 1438 04/26/20 1005 04/27/20 1733 04/29/20 0417  NA 133* 128* 132* 133* 135  K 5.5* 5.5* 5.5* 4.6 3.9  CL 92* 96* 92* 95* 96*  CO2 21*  --  23 22 25  GLUCOSE 118* 115* 87 118* 133*  BUN 67* 67* 73* 38* 26*  CREATININE 13.66* 14.80*   14.17* 9.65* 8.11*  CALCIUM 10.2  --  10.0 9.5 9.2  MG  --   --   --   --  2.1  PHOS  --   --  9.7* 6.1*  --     GFR: Estimated Creatinine Clearance: 8.4 mL/min (A) (by C-G formula based on SCr of 8.11 mg/dL (H)).  Liver Function Tests: Recent Labs  Lab 04/25/20 2336 04/26/20 1005 04/27/20 1733  AST 21  --   --   ALT 14  --   --   ALKPHOS 55  --   --   BILITOT 1.0  --   --   PROT 6.6  --   --   ALBUMIN 3.3* 2.8* 2.8*    CBG: Recent Labs  Lab 04/28/20 0652 04/28/20 1304 04/29/20 0144 04/29/20 0639 05/01/20 0637  GLUCAP 95 92 112* 99 90     Recent Results (from the past 240 hour(s))  SARS Coronavirus 2 by RT PCR (hospital order, performed in [facility] hospital lab) Nasopharyngeal Nasopharyngeal Swab     Status: None   Collection Time: 04/26/20 12:00 AM   Specimen: Nasopharyngeal Swab  Result Value Ref Range Status   SARS Coronavirus 2 NEGATIVE NEGATIVE Final    Comment: (NOTE) SARS-CoV-2 target nucleic acids are NOT DETECTED.  The SARS-CoV-2 RNA is generally detectable in upper and lower respiratory specimens during the acute phase of infection. The lowest concentration of SARS-CoV-2 viral copies this assay can detect is 250 copies / mL. A negative result does not preclude SARS-CoV-2 infection and should not be used as the sole basis for treatment or other patient management decisions.  A negative result may occur with improper specimen collection / handling, submission of specimen other than nasopharyngeal swab, presence of viral mutation(s) within the areas  targeted by this assay, and inadequate number of viral copies (<250 copies / mL). A negative result must be combined with clinical observations, patient history, and epidemiological information.  Fact Sheet for Patients:   https://www.fda.gov/media/136312/download  Fact Sheet for Healthcare Providers: https://www.fda.gov/media/136313/download  This test is not yet approved or  cleared by the [place] FDA and has been authorized for detection and/or diagnosis of SARS-CoV-2 by FDA under an Emergency Use Authorization (EUA).  This EUA will remain in effect (meaning this test can be used) for the duration of the COVID-19 declaration under Section 564(b)(1) of the Act, 21 U.S.C. section 360bbb-3(b)(1), unless the authorization is terminated or revoked sooner.  Performed at [facility], [address] [place], [place] [address]   MRSA PCR Screening     Status: None   Collection Time: 04/27/20  4:38 AM   Specimen: Nasal Mucosa; Nasopharyngeal  Result Value Ref Range Status   MRSA by PCR NEGATIVE NEGATIVE Final    Comment:        The GeneXpert MRSA Assay (FDA approved for NASAL specimens only), is one component of a comprehensive MRSA colonization surveillance program. It is not intended to diagnose MRSA infection nor to guide or monitor treatment for MRSA infections. Performed at [facility], [address] [place], [place] [address]   SARS Coronavirus 2 by RT PCR (hospital order, performed in  hospital lab) Nasopharyngeal Nasopharyngeal Swab     Status: None   Collection Time: 05/01/20 12:33 PM   Specimen: Nasopharyngeal Swab  Result Value Ref Range Status   SARS Coronavirus 2 NEGATIVE NEGATIVE Final    Comment: (NOTE) SARS-CoV-2 target nucleic acids are NOT DETECTED.    The SARS-CoV-2 RNA is generally detectable in upper and lower respiratory specimens during the acute phase of infection. The lowest concentration of SARS-CoV-2 viral copies this  assay can detect is 250 copies / mL. A negative result does not preclude SARS-CoV-2 infection and should not be used as the sole basis for treatment or other patient management decisions.  A negative result may occur with improper specimen collection / handling, submission of specimen other than nasopharyngeal swab, presence of viral mutation(s) within the areas targeted by this assay, and inadequate number of viral copies (<250 copies / mL). A negative result must be combined with clinical observations, patient history, and epidemiological information.  Fact Sheet for Patients:   StrictlyIdeas.no  Fact Sheet for Healthcare Providers: BankingDealers.co.za  This test is not yet approved or  cleared by the [place] FDA and has been authorized for detection and/or diagnosis of SARS-CoV-2 by FDA under an Emergency Use Authorization (EUA).  This EUA will remain in effect (meaning this test can be used) for the duration of the COVID-19 declaration under Section 564(b)(1) of the Act, 21 U.S.C. section 360bbb-3(b)(1), unless the authorization is terminated or revoked sooner.  Performed at [facility], [place] [address] [place], [place] [address]          Radiology Studies: CT ANGIO CHEST PE W OR WO CONTRAST  Result Date: 04/30/2020 CLINICAL DATA:  Shortness of breath, question pulmonary embolism, negative D-dimer EXAM: CT ANGIOGRAPHY CHEST WITH CONTRAST TECHNIQUE: Multidetector CT imaging of the chest was performed using the standard protocol during bolus administration of intravenous contrast. Multiplanar CT image reconstructions and MIPs were obtained to evaluate the vascular anatomy. CONTRAST:  19m OMNIPAQUE IOHEXOL 350 MG/ML SOLN Sagittal and coronal MPR images reconstructed from axial data set. COMPARISON:  05/19/2018 FINDINGS: Cardiovascular: Atherosclerotic calcifications aorta, coronary arteries, proximal great vessels.  Aneurysmal dilatation ascending thoracic aorta 4.8 cm diameter. Aortic root 5.1 cm diameter. No evidence of aortic dissection. Dilated central pulmonary arteries, main pulmonary artery 4.9 cm diameter question pulmonary arterial hypertension. Pulmonary arteries well opacified. Pulmonary arterial assessment in the lower lobes and in a portion of the upper lobes is limited by respiratory motion. No definite pulmonary emboli identified. Enlargement of cardiac chambers especially RIGHT ventricle and RIGHT atrium. Small pericardial effusion. Mediastinum/Nodes: Esophagus unremarkable. No thoracic adenopathy. Base of cervical region normal appearance. Lungs/Pleura: Minimal bibasilar effusions and atelectasis greater on RIGHT. Remaining lungs clear. Upper Abdomen: Large solid soft tissue mass identified in LEFT upper quadrant, by prior PET-CT arising from LEFT kidney consistent with neoplasm, visualized portion measuring 9.7 x 8.0 cm. Two small hyperdense nodules are seen within RIGHT kidney, larger 2.2 cm greatest size. Ventral hernia in RIGHT upper quadrant incompletely visualized. Probable additional cysts in both kidneys. Few nonspecific low-attenuation foci at the superior aspect of the liver, nonspecific, largest 10 mm. Musculoskeletal: Soft tissue gas identified at LEFT axilla likely related to recent vascular surgery LEFT arm. No acute osseous findings. Review of the MIP images confirms the above findings. IMPRESSION: No definite evidence of pulmonary embolism, with limited by respiratory motion. Aneurysmal dilatation of the thoracic aorta including ascending thoracic aorta 4.8 cm diameter, aortic root 5.1 cm diameter; recommend semi-annual imaging followup by CTA or MRA and referral to cardiothoracic surgery if not already obtained. This recommendation follows 2010 ACCF/AHA/AATS/ACR/ASA/SCA/SCAI/SIR/STS/SVM Guidelines for the Diagnosis and Management of Patients With Thoracic Aortic Disease. Circulation. 2010; 121::  [id number] Aortic aneurysm NOS (ICD10-I71.9) Small pericardial effusion. Dilated central pulmonary arteries consistent with pulmonary arterial  hypertension, which also likely accounts for dilated RIGHT ventricle and RIGHT atrium. Minimal bibasilar effusions and atelectasis greater on RIGHT. Large LEFT upper quadrant mass 9.7 x 8.0 cm consistent with neoplasm arising from LEFT kidney, see assessment by prior PET-CT. Ventral hernia in RIGHT upper quadrant incompletely visualized. Aortic Atherosclerosis (ICD10-I70.0). Electronically Signed   By: Lavonia Dana M.D.   On: 04/30/2020 16:30   EEG adult  Result Date: 05/01/2020 Lora Havens, MD     05/01/2020  5:00 PM Patient Name: [name] [name] MRN: [id number] Epilepsy Attending: Lora Havens Referring Physician/Provider: Dr Hosie Poisson Date: 05/01/2020 Duration: 23.05 mins Patient history: 74yo M with recurrent syncope. EEG to evaluate for seizure. Level of alertness: Awake AEDs during EEG study: None Technical aspects: This EEG study was done with scalp electrodes positioned according to the 10-20 International system of electrode placement. Electrical activity was acquired at a sampling rate of 500Hz and reviewed with a high frequency filter of 70Hz and a low frequency filter of 1Hz. EEG data were recorded continuously and digitally stored. Description: The posterior dominant rhythm consists of 8 Hz activity of moderate voltage (25-35 uV) seen predominantly in posterior head regions, symmetric and reactive to eye opening and eye closing. Physiologic photic driving was seen during photic stimulation.  Hyperventilation was not performed.   IMPRESSION: This study is within normal limits. No seizures or epileptiform discharges were seen throughout the recording. Priyanka O Yadav   VAS US CAROTID  Result Date: 05/01/2020 Carotid Arterial Duplex Study Indications:      Syncope. Risk Factors:     Hypertension, hyperlipidemia, Diabetes, coronary artery                    disease. Comparison Study: no prior Performing Technologist: Abram Sander RVS  Examination Guidelines: A complete evaluation includes B-mode imaging, spectral Doppler, color Doppler, and power Doppler as needed of all accessible portions of each vessel. Bilateral testing is considered an integral part of a complete examination. Limited examinations for reoccurring indications may be performed as noted.  Right Carotid Findings: +----------+--------+--------+--------+------------------+--------+           PSV cm/sEDV cm/sStenosisPlaque DescriptionComments +----------+--------+--------+--------+------------------+--------+ CCA Prox  65      11              heterogenous               +----------+--------+--------+--------+------------------+--------+ CCA Distal53      13              heterogenous               +----------+--------+--------+--------+------------------+--------+ ICA Prox  51      20      1-39%   heterogenous               +----------+--------+--------+--------+------------------+--------+ ICA Distal51      19                                         +----------+--------+--------+--------+------------------+--------+ ECA       50                                                 +----------+--------+--------+--------+------------------+--------+ +----------+--------+-------+--------+-------------------+  PSV cm/sEDV cmsDescribeArm Pressure (mmHG) +----------+--------+-------+--------+-------------------+ [id number]                                         +----------+--------+-------+--------+-------------------+ +---------+--------+--+--------+--+---------+ VertebralPSV cm/s59EDV cm/s26Antegrade +---------+--------+--+--------+--+---------+  Left Carotid Findings: +----------+--------+--------+--------+------------------+--------+           PSV cm/sEDV cm/sStenosisPlaque DescriptionComments  +----------+--------+--------+--------+------------------+--------+ CCA Prox  71      12              heterogenous               +----------+--------+--------+--------+------------------+--------+ CCA Distal61      14              heterogenous               +----------+--------+--------+--------+------------------+--------+ ICA Prox  44      14      1-39%   heterogenous               +----------+--------+--------+--------+------------------+--------+ ICA Distal62      22                                         +----------+--------+--------+--------+------------------+--------+ ECA       52                                                 +----------+--------+--------+--------+------------------+--------+ +----------+--------+--------+--------+-------------------+           PSV cm/sEDV cm/sDescribeArm Pressure (mmHG) +----------+--------+--------+--------+-------------------+ [id number]                                          +----------+--------+--------+--------+-------------------+ +---------+--------+--+--------+--+---------+ VertebralPSV cm/s46EDV cm/s17Antegrade +---------+--------+--+--------+--+---------+   Summary: Right Carotid: Velocities in the right ICA are consistent with a 1-39% stenosis. Left Carotid: Velocities in the left ICA are consistent with a 1-39% stenosis. Vertebrals: Bilateral vertebral arteries demonstrate antegrade flow. *See table(s) above for measurements and observations.  Electronically signed by Ruta Hinds MD on 05/01/2020 at 4:19:25 PM.    Final         Scheduled Meds: . [START ON 05/02/2020] amLODipine  5 mg Oral Daily  . Chlorhexidine Gluconate Cloth  6 each Topical Daily  . Chlorhexidine Gluconate Cloth  6 each Topical Q0600  . darbepoetin (ARANESP) injection - DIALYSIS  200 mcg Intravenous Q Thu-HD  . ferric citrate  420 mg Oral TID WC  . heparin  5,000 Units Subcutaneous Q8H  . melatonin  3 mg Oral QHS  .  sodium chloride flush  3 mL Intravenous Q12H  . sodium chloride flush  3 mL Intravenous Q12H  . tamsulosin  0.4 mg Oral Daily   Continuous Infusions: . sodium chloride       LOS: 5 days        Hosie Poisson, MD [facility]   To contact the attending provider between 7A-7P or the covering provider during after hours 7P-7A, please log into the web site [email or web address] and access using universal [place] password for that web site. If you do not have the password, please call the hospital operator.  05/01/2020, 5:52  PM    

## 2020-05-01 NOTE — Progress Notes (Addendum)
Subjective: Just worked with physical therapy, standing walking in room sitting up in bedside chair all tolerated. [employer] box at bedside and multiple cups reminded of renal failure diet.  Requesting blood transfusion hemoglobin 7.9 discussed with him lightheaded and dizziness from deconditioning and missed dialysis  Objective Vital signs in last 24 hours: Vitals:   04/30/20 2027 05/01/20 0223 05/01/20 0449 05/01/20 0552  BP: (!) 136/91 (!) 139/91  121/80  Pulse: 75 76  69  Resp: 16   17  Temp: 98.7 F (37.1 C) (!) 97.5 F (36.4 C)  98.5 F (36.9 C)  TempSrc: Oral Oral  Oral  SpO2:  96%  96%  Weight:   85.5 kg   Height:       Weight change: -0.23 kg  Physical Exam: General:Chronically ill thin elderly male NAD  Heart:RRR, no murmur rub or gallop appreciated Lungs:CTA anteriorly nonlabored breathing Abdomen:Bowel sounds normoactive,  ventral hernia ,NT ,,no ascites appreciated Extremities: Trace pedal edema Dialysis Access:Right IJ PermCath dressing dry clear,  left upper arm AV fistula positive bruit,  recent second stage surgical incision healing   Dialysis Orders: [facility] on TTS, 4 hours 15 minutes,87 kg EDW, 2K 2 calcium bath, UF profile 4, no heparin, Mircera 200 MCG last given 03/30/2020 noVDRA  AsProblem/Plan: 1.Weakness/syncope- thought to be 2/2 uremia/FTT in setting of multiple missed HD. Workup per primary - MRIwith no acute findings, chronic changes. 2. Hyperkalemia -resolved, K3.9.(8/28) 3. Hyponatremia- likely 2/2 missed HD. Improved, Na 135. 4. ESRD- on HD TTS. Missed multiple HD. HDyesterday not tolerated d/t chest pain.  Next HD on 8/31. 2nd stage of AVF completed8/27/21 by Dr. Donzetta Matters.  Follow up visit in 3-4 weeks and anticipate AVF will be ready to use at that time.  5. Anemiaof CKD-Hgb7.9.(8/28/) Has missed ESA at OP HD d/t non compliance. Aranesp 235mcg qwk given 8/26.tsat 30%, no indication for IV iron at this time.Hgb improving from  last 7.3  , no indication for transfusion.  6. Secondary hyperparathyroidism- Ca at goal.LastPhos 6.1 , continue binders. pth 49. No VDRA. 7.HTN/volume- BP well controlled.  Decrease amlodipine 10 mg to 5 mg,does not appear grossly volume overloaded.  Trace edema.  Minimal volume removed with HD  8/29, net ~452mL.  Under edw, will need to lower on d/c.  8.Chest pain - primary ordered V/Q scan  8/29 - for PE, revealed 4.8X thoracic ascending aortic aneurysm( known history of) 9. Nutrition- Renal diet w/fluid restrictions. Alb 2.8, protein supplements and Vit.  10. Renal CA - OP urolog .  Work-up recommended, in past patient reported "not interested in doing" 11. 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 67.ELFYBOF diastolic HF - per primary .  Addendum= noted hemoglobin 8.3 this a.m. Ernest Haber, PA-C [facility] Beeper [phone] 05/01/2020,9:57 AM  LOS: 5 days   Labs: Basic Metabolic Panel: Recent Labs  Lab 04/26/20 1005 04/27/20 1733 04/29/20 0417  NA 132* 133* 135  K 5.5* 4.6 3.9  CL 92* 95* 96*  CO2 23 22 25   GLUCOSE 87 118* 133*  BUN 73* 38* 26*  CREATININE 14.17* 9.65* 8.11*  CALCIUM 10.0 9.5 9.2  PHOS 9.7* 6.1*  --    Liver Function Tests: Recent Labs  Lab 04/25/20 2336 04/26/20 1005 04/27/20 1733  AST 21  --   --   ALT 14  --   --   ALKPHOS 55  --   --  BILITOT 1.0  --   --   PROT 6.6  --   --   ALBUMIN 3.3* 2.8* 2.8*   Recent Labs  Lab 04/25/20 2336  LIPASE 29   No results for input(s): AMMONIA in the last 168 hours. CBC: Recent Labs  Lab 04/25/20 1429 04/25/20 1438 04/26/20 0543 04/26/20 0543 04/26/20 1004 04/26/20 1004 04/27/20 1402 04/29/20 0750 04/30/20 0849  WBC 8.0   < > 5.0   < > 6.5  --  7.6 6.4  --   NEUTROABS  --   --   --   --   --   --  5.5  --   --   HGB 8.0*   < > 7.2*   < > 7.5*   < > 7.9* 7.3* 7.9*  HCT 25.9*   < >  22.4*   < > 24.3*   < > 25.9* 24.5* 26.4*  MCV 99.6  --  99.1  --  101.7*  --  101.2* 104.7*  --   PLT 193   < > 156   < > 174  --  159 154  --    < > = values in this interval not displayed.   Cardiac Enzymes: No results for input(s): CKTOTAL, CKMB, CKMBINDEX, TROPONINI in the last 168 hours. CBG: Recent Labs  Lab 04/28/20 0652 04/28/20 1304 04/29/20 0144 04/29/20 0639 05/01/20 0637  GLUCAP 95 92 112* 99 90    Studies/Results: CT ANGIO CHEST PE W OR WO CONTRAST  Result Date: 04/30/2020 CLINICAL DATA:  Shortness of breath, question pulmonary embolism, negative D-dimer EXAM: CT ANGIOGRAPHY CHEST WITH CONTRAST TECHNIQUE: Multidetector CT imaging of the chest was performed using the standard protocol during bolus administration of intravenous contrast. Multiplanar CT image reconstructions and MIPs were obtained to evaluate the vascular anatomy. CONTRAST:  30mL OMNIPAQUE IOHEXOL 350 MG/ML SOLN Sagittal and coronal MPR images reconstructed from axial data set. COMPARISON:  05/19/2018 FINDINGS: Cardiovascular: Atherosclerotic calcifications aorta, coronary arteries, proximal great vessels. Aneurysmal dilatation ascending thoracic aorta 4.8 cm diameter. Aortic root 5.1 cm diameter. No evidence of aortic dissection. Dilated central pulmonary arteries, main pulmonary artery 4.9 cm diameter question pulmonary arterial hypertension. Pulmonary arteries well opacified. Pulmonary arterial assessment in the lower lobes and in a portion of the upper lobes is limited by respiratory motion. No definite pulmonary emboli identified. Enlargement of cardiac chambers especially RIGHT ventricle and RIGHT atrium. Small pericardial effusion. Mediastinum/Nodes: Esophagus unremarkable. No thoracic adenopathy. Base of cervical region normal appearance. Lungs/Pleura: Minimal bibasilar effusions and atelectasis greater on RIGHT. Remaining lungs clear. Upper Abdomen: Large solid soft tissue mass identified in LEFT upper  quadrant, by prior PET-CT arising from LEFT kidney consistent with neoplasm, visualized portion measuring 9.7 x 8.0 cm. Two small hyperdense nodules are seen within RIGHT kidney, larger 2.2 cm greatest size. Ventral hernia in RIGHT upper quadrant incompletely visualized. Probable additional cysts in both kidneys. Few nonspecific low-attenuation foci at the superior aspect of the liver, nonspecific, largest 10 mm. Musculoskeletal: Soft tissue gas identified at LEFT axilla likely related to recent vascular surgery LEFT arm. No acute osseous findings. Review of the MIP images confirms the above findings. IMPRESSION: No definite evidence of pulmonary embolism, with limited by respiratory motion. Aneurysmal dilatation of the thoracic aorta including ascending thoracic aorta 4.8 cm diameter, aortic root 5.1 cm diameter; recommend semi-annual imaging followup by CTA or MRA and referral to cardiothoracic surgery if not already obtained. This recommendation follows 2010 ACCF/AHA/AATS/ACR/ASA/SCA/SCAI/SIR/STS/SVM Guidelines for the Diagnosis and  Management of Patients With Thoracic Aortic Disease. Circulation. 2010; 121: L597-I718. Aortic aneurysm NOS (ICD10-I71.9) Small pericardial effusion. Dilated central pulmonary arteries consistent with pulmonary arterial hypertension, which also likely accounts for dilated RIGHT ventricle and RIGHT atrium. Minimal bibasilar effusions and atelectasis greater on RIGHT. Large LEFT upper quadrant mass 9.7 x 8.0 cm consistent with neoplasm arising from LEFT kidney, see assessment by prior PET-CT. Ventral hernia in RIGHT upper quadrant incompletely visualized. Aortic Atherosclerosis (ICD10-I70.0). Electronically Signed   By: Lavonia Dana M.D.   On: 04/30/2020 16:30   Medications:  sodium chloride      amLODipine  10 mg Oral Daily   Chlorhexidine Gluconate Cloth  6 each Topical Daily   Chlorhexidine Gluconate Cloth  6 each Topical Q0600   darbepoetin (ARANESP) injection - DIALYSIS   200 mcg Intravenous Q Thu-HD   ferric citrate  420 mg Oral TID WC   heparin  5,000 Units Subcutaneous Q8H   melatonin  3 mg Oral QHS   sodium chloride flush  3 mL Intravenous Q12H   sodium chloride flush  3 mL Intravenous Q12H   tamsulosin  0.4 mg Oral Daily

## 2020-05-01 NOTE — Progress Notes (Signed)
Pt is asking for something for sleep. RN messaged MD on call to see if we can have an order for something.   Eleanora Neighbor, RN

## 2020-05-01 NOTE — Progress Notes (Signed)
PT Cancellation Note  Patient Details Name: [name] [name] MRN: [id number] DOB: April 11, 1946   Cancelled Treatment:    Reason Eval/Treat Not Completed: Patient declined, no reason specified. Upon arrival of PT, the pt is requesting to speak with MD regarding medical course and planned interventions prior to working with PT. The pt continued to decline this morning despite attempts at education regarding role of activity in his care and precautions taken during PT session. I will continue to follow and attempt to progress mobility as time/schedule allow.    Karma Ganja, PT, DPT   Acute Rehabilitation Department Pager #: [phone]  Otho Bellows 05/01/2020, 9:27 AM

## 2020-05-01 NOTE — Progress Notes (Signed)
Physical Therapy Treatment Patient Details Name: [name] [name] MRN: [id number] DOB: 03-Aug-1946 Today's Date: 05/01/2020    History of Present Illness The pt is a 74 yo male presenting from home with SOB, generalized weakness, and syncope. PMH includes: ESRD (HD TTS), DM II, ascending aortic aneurysim, CAD, COPD, HTN, afib, light chain myeloma.    PT Comments    The pt is continuing to make good progress with therapy this morning after receiving encouragement from PA to participate. He was able to perform OOB transfers and bed mobility with minG for safety and stable BP without symptoms related to orthostatic hypotension. The pt was then agreeable to 2 short bouts of walking in the room (8 ft with close chair follow and seated rest of 3-5 min between each bout). He requires minA to stabilize and BUE support as well as continued cues for positioning in the RW and gait pattern. The pt did report significant fatigue and BP following ambulation was 71/50 with HR of 83 bpm, but stabilized quickly with continued seated rest. The pt will continue to benefit from skilled PT to further progress functional mobility, activity tolerance, and stability to reduce risk of falls following d/c.   VITALS:  - supine - BP: 120/84 (93); HR: 71bpm - sitting EOB - BP: 93/70 (78); HR: 80bpm - sitting (post seated exercises) - BP: 105/81 (90); HR: 99bpm - standing - BP: 115/80 (89); HR: 88bpm - sitting in recliner (post ambulation) - 71/50 (55); HR: 83bpm - sitting in recliner (end of session) - 133/90 (103); HR: 68bpm    Follow Up Recommendations  SNF     Equipment Recommendations   (defer to post acute)    Recommendations for Other Services       Precautions / Restrictions Precautions Precautions: Fall Precaution Comments: 3L O2, pt with multiple falls due to syncope at home, BP drop to 71/50 after 2 bouts of gait in room Restrictions Weight Bearing Restrictions: No    Mobility  Bed Mobility Overal bed  mobility: Needs Assistance Bed Mobility: Supine to Sit     Supine to sit: Min assist;Min guard     General bed mobility comments: pt able to move BLE to EOB without assist, able to raise trunk to sitting EOB with use of bed rails  Transfers Overall transfer level: Needs assistance Equipment used: Rolling walker (2 wheeled) Transfers: Sit to/from [facility] Sit to Stand: Min assist Stand pivot transfers: Min guard       General transfer comment: minA to power up, then minG to stabilize in standing. VC for hand positioning for power up and lower.  Ambulation/Gait Ambulation/Gait assistance: Min guard Gait Distance (Feet): 8 Feet (x2) Assistive device: Rolling walker (2 wheeled) Gait Pattern/deviations: Shuffle;Step-through pattern;Trunk flexed Gait velocity: decreased Gait velocity interpretation: <1.31 ft/sec, indicative of household ambulator General Gait Details: pt with small forward steps in room with chair follow. significant trunk flexion with poor proximity to RW despite cues. SpO2 stable on 3L (96-99%)   Stairs             Wheelchair Mobility    Modified Rankin (Stroke Patients Only)       Balance Overall balance assessment: Needs assistance Sitting-balance support: No upper extremity supported;Feet supported Sitting balance-Leahy Scale: Fair     Standing balance support: Single extremity supported;During functional activity Standing balance-Leahy Scale: Fair Standing balance comment: pt able to maintain with single UE support while taking BP  Cognition Arousal/Alertness: Awake/alert Behavior During Therapy: WFL for tasks assessed/performed Overall Cognitive Status: No family/caregiver present to determine baseline cognitive functioning Area of Impairment: Safety/judgement;Problem solving                         Safety/Judgement: Decreased awareness of safety   Problem Solving: Slow  processing;Requires verbal cues General Comments: tangential speech, able to recount situation well, but very repetitive. cues for safety. poor critical thinking and insight to role of activity/mobility despite continued education      Exercises General Exercises - Lower Extremity Long Arc Quad: AROM;Both;10 reps;Seated Heel Slides: AROM;Seated;Both;10 reps Heel Raises: AROM;Both;10 reps;Seated    General Comments General comments (skin integrity, edema, etc.): BP stable through initial stand and transfer to recliner despite pt concerns for syncope with mobility. BP from 115/80 to 71/50 following 2 bouts of short ambulation in the room with 5 min seated rest between each 42ft bout of walking. Pt reports fatigue only. Recovered to 133/90 after 1 min seated rest      Pertinent Vitals/Pain Pain Assessment: Faces Faces Pain Scale: Hurts a little bit Pain Location: L arm (surgical site) Pain Descriptors / Indicators: Burning;Grimacing;Sore Pain Intervention(s): Monitored during session;Limited activity within patient's tolerance;Repositioned           PT Goals (current goals can now be found in the care plan section) Acute Rehab PT Goals Patient Stated Goal: reduce frequency of syncope so he can get back to mobilizing with a cane PT Goal Formulation: With patient Time For Goal Achievement: 05/11/20 Potential to Achieve Goals: Fair Progress towards PT goals: Progressing toward goals    Frequency    Min 3X/week      PT Plan Current plan remains appropriate       AM-PAC PT "6 Clicks" Mobility   Outcome Measure  Help needed turning from your back to your side while in a flat bed without using bedrails?: A Little Help needed moving from lying on your back to sitting on the side of a flat bed without using bedrails?: A Little Help needed moving to and from a bed to a chair (including a wheelchair)?: A Little Help needed standing up from a chair using your arms (e.g., wheelchair  or bedside chair)?: A Little Help needed to walk in hospital room?: A Lot Help needed climbing 3-5 steps with a railing? : Total 6 Click Score: 15    End of Session Equipment Utilized During Treatment: Gait belt;Oxygen (3L) Activity Tolerance: Patient limited by fatigue;Patient tolerated treatment well Patient left: in chair;with call bell/phone within reach Nurse Communication: Mobility status (BP with mobility) PT Visit Diagnosis: Muscle weakness (generalized) (M62.81);Difficulty in walking, not elsewhere classified (R26.2);History of falling (Z91.81)     Time: 7858-8502 PT Time Calculation (min) (ACUTE ONLY): 41 min  Charges:  $Gait Training: 23-37 mins $Therapeutic Exercise: 8-22 mins                     [name] [name], PT, DPT   Acute Rehabilitation Department Pager #: [phone]    [name] [name] 05/01/2020, 12:22 PM

## 2020-05-01 NOTE — Procedures (Addendum)
Patient Name: [name] [name]  MRN: [id number]  Epilepsy Attending: Lora Havens  Referring Physician/Provider: Dr Hosie Poisson Date: 05/01/2020 Duration: 23.05 mins  Patient history: 74yo M with recurrent syncope. EEG to evaluate for seizure.  Level of alertness: Awake  AEDs during EEG study: None  Technical aspects: This EEG study was done with scalp electrodes positioned according to the 10-20 International system of electrode placement. Electrical activity was acquired at a sampling rate of 500Hz  and reviewed with a high frequency filter of 70Hz  and a low frequency filter of 1Hz . EEG data were recorded continuously and digitally stored.   Description: The posterior dominant rhythm consists of 8 Hz activity of moderate voltage (25-35 uV) seen predominantly in posterior head regions, symmetric and reactive to eye opening and eye closing. Physiologic photic driving was seen during photic stimulation.  Hyperventilation was not performed.     IMPRESSION: This study is within normal limits. No seizures or epileptiform discharges were seen throughout the recording.  [name]

## 2020-05-01 NOTE — Progress Notes (Signed)
Pt is refusing bed alarm.   Eleanora Neighbor, RN

## 2020-05-01 NOTE — Progress Notes (Addendum)
When entering the room the Pt had his oxygen off his face. Oxygen was placed back on the Pt.  Pt was repositioned in his bed. Pt is in a low bed. When putting the bed in the lowest position pt refused and stated "its down too low, I need it up a little bit." Pt was educated about the low bed and the bed being placed in the lowest position. Pt still wanted bed to be up a little bit.  Pt bed alarm is on, call bell, personal items within reach.  Adriell Polansky, RN

## 2020-05-01 NOTE — Progress Notes (Signed)
[name] Community Based Palliative Care       This patient is enrolled in our palliative care services in the community.  [facility] will continue to follow for any discharge planning needs and to coordinate continuation of palliative care.   If you have questions or need assistance, please call [phone] or contact the hospital Liaison listed on AMION.     Thank you for the opportunity to participate in this patient's care.     Chrislyn King, BSN, RN [facility] Hospital Liaison   [phone]   

## 2020-05-01 NOTE — Progress Notes (Signed)
Carotid duplex has been completed.   Preliminary results in CV Proc.   Abram Sander 05/01/2020 2:55 PM

## 2020-05-01 NOTE — Progress Notes (Signed)
EEG complete - results pending 

## 2020-05-02 DIAGNOSIS — I10 Essential (primary) hypertension: Secondary | ICD-10-CM

## 2020-05-02 DIAGNOSIS — R55 Syncope and collapse: Secondary | ICD-10-CM

## 2020-05-02 DIAGNOSIS — N186 End stage renal disease: Secondary | ICD-10-CM

## 2020-05-02 DIAGNOSIS — I2510 Atherosclerotic heart disease of native coronary artery without angina pectoris: Secondary | ICD-10-CM

## 2020-05-02 LAB — RENAL FUNCTION PANEL
Albumin: 2.7 g/dL — ABNORMAL LOW (ref 3.5–5.0)
Anion gap: 11 (ref 5–15)
BUN: 30 mg/dL — ABNORMAL HIGH (ref 8–23)
CO2: 25 mmol/L (ref 22–32)
Calcium: 10.1 mg/dL (ref 8.9–10.3)
Chloride: 92 mmol/L — ABNORMAL LOW (ref 98–111)
Creatinine, Ser: 8.65 mg/dL — ABNORMAL HIGH (ref 0.61–1.24)
GFR calc Af Amer: 6 mL/min — ABNORMAL LOW (ref >60)
GFR calc non Af Amer: 5 mL/min — ABNORMAL LOW (ref >60)
Glucose, Bld: 87 mg/dL (ref 70–99)
Phosphorus: 5.6 mg/dL — ABNORMAL HIGH (ref 2.5–4.6)
Potassium: 5.2 mmol/L — ABNORMAL HIGH (ref 3.5–5.1)
Sodium: 128 mmol/L — ABNORMAL LOW (ref 135–145)

## 2020-05-02 LAB — CBC
HCT: 23.6 % — ABNORMAL LOW (ref 39.0–52.0)
Hemoglobin: 7.1 g/dL — ABNORMAL LOW (ref 13.0–17.0)
MCH: 31.3 pg (ref 26.0–34.0)
MCHC: 30.1 g/dL (ref 30.0–36.0)
MCV: 104 fL — ABNORMAL HIGH (ref 80.0–100.0)
Platelets: 175 10*3/uL (ref 150–400)
RBC: 2.27 MIL/uL — ABNORMAL LOW (ref 4.22–5.81)
RDW: 21.2 % — ABNORMAL HIGH (ref 11.5–15.5)
WBC: 7.1 10*3/uL (ref 4.0–10.5)
nRBC: 0 % (ref 0.0–0.2)

## 2020-05-02 LAB — PTH, INTACT AND CALCIUM: Calcium, Total (PTH): 9 mg/dL (ref 8.6–10.2)

## 2020-05-02 LAB — GLUCOSE, CAPILLARY: Glucose-Capillary: 89 mg/dL (ref 70–99)

## 2020-05-02 MED ORDER — HYDROCODONE-ACETAMINOPHEN 5-325 MG PO TABS
1.0000 | ORAL_TABLET | Freq: Four times a day (QID) | ORAL | 0 refills | Status: AC | PRN
Start: 2020-05-02 — End: 2020-05-05

## 2020-05-02 MED ORDER — DARBEPOETIN ALFA 200 MCG/0.4ML IJ SOSY: 200.0000 ug | PREFILLED_SYRINGE | INTRAMUSCULAR | Status: DC

## 2020-05-02 MED ORDER — HEPARIN SODIUM (PORCINE) 1000 UNIT/ML IJ SOLN
INTRAMUSCULAR | Status: AC
  Filled 2020-05-02: qty 4

## 2020-05-02 MED ORDER — ACETAMINOPHEN 325 MG PO TABS
ORAL_TABLET | ORAL | Status: AC
  Filled 2020-05-02: qty 2

## 2020-05-02 MED ORDER — MELATONIN 3 MG PO TABS: 3.0000 mg | ORAL_TABLET | Freq: Every day | ORAL | 0 refills | Status: AC

## 2020-05-02 MED ORDER — AMLODIPINE BESYLATE 5 MG PO TABS: 5.0000 mg | ORAL_TABLET | Freq: Every day | ORAL | Status: DC

## 2020-05-02 MED ORDER — SENNOSIDES-DOCUSATE SODIUM 8.6-50 MG PO TABS: 1.0000 | ORAL_TABLET | Freq: Every evening | ORAL | Status: AC | PRN

## 2020-05-02 NOTE — Discharge Summary (Signed)
Physician Discharge Summary  [name] [name] TGP:[id number] DOB: [birth year] DOA: 04/25/2020  PCP: [name], DO  Admit date: 04/25/2020 Discharge date: 05/02/2020  Admitted From: Home. Disposition:  SNF  Recommendations for Outpatient Follow-up:  1. Follow up with PCP in 1-2 weeks 2. Please obtain BMP/CBC in one week Please follow up with nephrology as recommended.  Please follow up with outpatient palliative care on discharge.   Discharge Condition: Guarded.  CODE STATUS:full code.  Diet recommendation: Heart Healthy   Brief/Interim Summary:  74 year old gentleman with prior history of end-stage renal disease, ascending aortic aneurysm, multiple myeloma, paroxysmal atrial fibrillation presents to ED with multiple syncopal episodes. He was admitted for evaluation of syncope. Nephrology consulted for end-stage renal disease on dialysis. So far patient hasn't had any episodes of syncope. It was thought secondary to fluid shift post HD. Vascular surgery on board, performed left arm second stage basilic vein transposition. recommend outpatient follow up with vascular surgery in 3 to 4 weeks.  Pt seen and examined atbedside, he reports sob requiring oxygen.  V/Q SCAN WAS discontinued and CT angiogram ordered, and negative for PE. Carotid duplex ordered. EEG ordered. As per RN pt had another episode of near syncope earlier this am. Cardiology consulted for recommendations.   Discharge Diagnoses:  Principal Problem:   Syncope Active Problems:   Essential (primary) hypertension   BPH (benign prostatic hyperplasia)   Light chain myeloma (HCC)   CAD (coronary artery disease)   Chronic diastolic CHF (congestive heart failure) (HCC)   Syncope and collapse   ESRD (end stage renal disease) (HCC)   Symptomatic anemia   Generalized weakness   Hyperkalemia   Hyponatremia   Prolonged QT interval  Recurrent syncope probably secondary to fluid shift post dialysis vs vaso vagal  episodes . As per the patient he had missed multiple HD sessions.  Echocardiogram ordered for further evaluation showed in comparison to last year his right ventricular size has increased, tricuspid valve regurgitation is severe, and RVSP is severely elevated.  EKG shows sinus arrhythmia with non specific t wave changes.  Orthostatic vital signs are negative at this time.  Pt reports sob and is requiring oxygen.  CT angiogram ordered for evaluation of PE and is negative.  As per RN, pt had another episode of near syncope while on the bed side commode. Carotid duplex ordered, it showed Right Carotid: Velocities in the right ICA are consistent with a 1-39% stenosis.  Left Carotid: Velocities in the left ICA are consistent with a 1-39% stenosis. Vertebrals: Bilateral vertebral arteries demonstrate antegrade flow Cardiology consulted for further evaluation of recurrent syncope. recommended TED hoses and to hold norvasc on HD days.     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 Patient has missed his Mircera at outpatient kidney center due to noncompliance.  He is scheduled for Aranesp injection this admission.  Hemoglobin stable between 7 to 8.   Light chain Myeloma:  Follows up with Dr Irene Limbo , last visit last month.  Progression since last treatments. Pt does not want to follow up with oncology at this time.    Renal cell carcinoma: Pt underwent PET/CT  Recently showing renal cell ca, with new areas of bony uptake particularly in the left proximal humerus and right iliac bone suspicious for myeloma vs renal ca with mets.  Oncology Dr Irene Limbo recommended outpatient follow up with Urology for further work up ASAP. Pt does not appear to be interested in doing that at this time.  Type 2 diabetes mellitus CBG (last 3)  Recent Labs (last 2 labs)        Recent Labs    04/29/20 0144 04/29/20 0639 05/01/20 0637  GLUCAP 112* 99 90     Follow up with [facility].     Hyponatremia Probably secondary to fluid overload from missing dialysis. Continue to monitor.    End-stage renal disease on dialysis schedule TTS.  Nephrology on board and appreciate recommendations. Vascular surgery on board and underwent second stage basilic vein AV fistula transposition.  Hyperkalemia Probably secondary to missed dialysis.   Recheck K this afternoon   Chronic diastolic heart failure Patient appears to be compensated at this time. Fluid balance managed by HD sessions.    Essential hypertension;  Well controlled.    Discharge Instructions  Discharge Instructions    Diet - low sodium heart healthy   Complete by: As directed    Discharge instructions   Complete by: As directed    Follow up with PCP in one week.   No wound care   Complete by: As directed      Allergies as of 05/02/2020      Reactions   Amoxicillin Other (See Comments)   Tolerates Unasyn. Can't move Has patient had a PCN reaction causing immediate rash, facial/tongue/throat swelling, SOB or lightheadedness with hypotension: No Has patient had a PCN reaction causing severe rash involving mucus membranes or skin necrosis: No Has patient had a PCN reaction that required hospitalization No Has patient had a PCN reaction occurring within the last 10 years: No If all of the above answers are "NO", then may proceed with Cephalosporin use.   Penicillins Other (See Comments)   Tolerates Unasyn. Can't move/dizziness Has patient had a PCN reaction causing immediate rash, facial/tongue/throat swelling, SOB or lightheadedness with hypotension: No Has patient had a PCN reaction causing severe rash involving mucus membranes or skin necrosis: No Has patient had a PCN reaction that required hospitalization No Has patient had a PCN reaction occurring within the last 10 years: No If all of the above answers are "NO", then may proceed with Cephalosporin use.      Medication List    STOP  taking these medications   diclofenac Sodium 1 % Gel Commonly known as: Voltaren   oxyCODONE-acetaminophen 5-325 MG tablet Commonly known as: PERCOCET/ROXICET     TAKE these medications   amLODipine 5 MG tablet Commonly known as: NORVASC Take 1 tablet (5 mg total) by mouth daily. What changed:   medication strength  how much to take   atorvastatin 40 MG tablet Commonly known as: LIPITOR Take 40 mg by mouth daily as needed (pt states he only takes when his cholesterol is high).   Auryxia 1 GM 210 MG(Fe) tablet Generic drug: ferric citrate Take 420 mg by mouth 3 (three) times daily.   cetirizine 10 MG tablet Commonly known as: ZYRTEC TAKE 1 TABLET (10 MG TOTAL) BY MOUTH DAILY AS NEEDED (SEASONAL ALLERGIES). What changed: reasons to take this   Darbepoetin Alfa 200 MCG/0.4ML Sosy injection Commonly known as: ARANESP Inject 0.4 mLs (200 mcg total) into the vein every Thursday with hemodialysis. Start taking on: May 04, 2020   HYDROcodone-acetaminophen 5-325 MG tablet Commonly known as: NORCO/VICODIN Take 1-2 tablets by mouth every 6 (six) hours as needed for up to 3 days (gout pain).   ixazomib citrate 3 MG capsule Commonly known as: Ninlaro Take 1 capsule (3 mg) by mouth weekly, 3 weeks on, 1 week off,  repeat every 4 weeks. Take on an empty stomach 1hr before or 2hr after meals. What changed:   how much to take  how to take this  when to take this   melatonin 3 MG Tabs tablet Take 1 tablet (3 mg total) by mouth at bedtime.   OneTouch Ultra test strip Generic drug: glucose blood Check FSBS twice a day. Dx: E11.21, N18.32 What changed:   how much to take  how to take this  when to take this   senna-docusate 8.6-50 MG tablet Commonly known as: Senokot-S Take 1 tablet by mouth at bedtime as needed for mild constipation.   tamsulosin 0.4 MG Caps capsule Commonly known as: FLOMAX Take 1 capsule (0.4 mg total) by mouth daily.       Contact  information for follow-up providers    Waynetta Sandy, MD.   Specialties: Vascular Surgery, Cardiology Why: The office will call the patient with an appointment Contact information: [place] [address] [phone]        [name], DO. Schedule an appointment as soon as possible for a visit in 1 week(s).   Specialty: Family Medicine Contact information: [address] [place] [address] [phone]        Minus Breeding, MD .   Specialty: Cardiology Contact information: [address] [place] [address] [phone]            Contact information for after-discharge care    Destination    [facility] .   Service: Skilled Nursing Contact information: [place] [phone]                 Allergies  Allergen Reactions  . Amoxicillin Other (See Comments)    Tolerates Unasyn. Can't move Has patient had a PCN reaction causing immediate rash, facial/tongue/throat swelling, SOB or lightheadedness with hypotension: No Has patient had a PCN reaction causing severe rash involving mucus membranes or skin necrosis: No Has patient had a PCN reaction that required hospitalization No Has patient had a PCN reaction occurring within the last 10 years: No If all of the above answers are "NO", then may proceed with Cephalosporin use.   [name] Penicillins Other (See Comments)    Tolerates Unasyn. Can't move/dizziness Has patient had a PCN reaction causing immediate rash, facial/tongue/throat swelling, SOB or lightheadedness with hypotension: No Has patient had a PCN reaction causing severe rash involving mucus membranes or skin necrosis: No Has patient had a PCN reaction that required hospitalization No Has patient had a PCN reaction occurring within the last 10 years: No If all of the above answers are "NO", then may proceed with Cephalosporin use.       Consultations:  Cardiology  Nephrology.      Procedures/Studies: DG Chest 2 View  Result Date: 04/20/2020 CLINICAL DATA:  Chest pain, SOB EXAM: CHEST - 2 VIEW COMPARISON:  03/21/2020 chest radiograph and prior. FINDINGS: Tunneled right IJ catheter tip overlies the superior right atrium. Cardiomegaly and central pulmonary venous congestion. No focal airspace opacities. No pneumothorax or pleural effusion. No acute osseous abnormality. IMPRESSION: Cardiomegaly and central pulmonary venous congestion. No focal airspace disease or frank edema. Electronically Signed   By: Primitivo Gauze M.D.   On: 04/20/2020 08:26   CT ANGIO CHEST PE W OR WO CONTRAST  Result Date: 04/30/2020 CLINICAL DATA:  Shortness of breath, question pulmonary embolism, negative D-dimer EXAM: CT ANGIOGRAPHY CHEST WITH CONTRAST TECHNIQUE:  Multidetector CT imaging of the chest was performed using the standard protocol during bolus administration of intravenous contrast. Multiplanar CT image reconstructions and MIPs were obtained to evaluate the vascular anatomy. CONTRAST:  42m OMNIPAQUE IOHEXOL 350 MG/ML SOLN Sagittal and coronal MPR images reconstructed from axial data set. COMPARISON:  05/19/2018 FINDINGS: Cardiovascular: Atherosclerotic calcifications aorta, coronary arteries, proximal great vessels. Aneurysmal dilatation ascending thoracic aorta 4.8 cm diameter. Aortic root 5.1 cm diameter. No evidence of aortic dissection. Dilated central pulmonary arteries, main pulmonary artery 4.9 cm diameter question pulmonary arterial hypertension. Pulmonary arteries well opacified. Pulmonary arterial assessment in the lower lobes and in a portion of the upper lobes is limited by respiratory motion. No definite pulmonary emboli identified. Enlargement of cardiac chambers especially RIGHT ventricle and RIGHT atrium. Small pericardial effusion. Mediastinum/Nodes: Esophagus unremarkable. No thoracic adenopathy. Base of cervical region  normal appearance. Lungs/Pleura: Minimal bibasilar effusions and atelectasis greater on RIGHT. Remaining lungs clear. Upper Abdomen: Large solid soft tissue mass identified in LEFT upper quadrant, by prior PET-CT arising from LEFT kidney consistent with neoplasm, visualized portion measuring 9.7 x 8.0 cm. Two small hyperdense nodules are seen within RIGHT kidney, larger 2.2 cm greatest size. Ventral hernia in RIGHT upper quadrant incompletely visualized. Probable additional cysts in both kidneys. Few nonspecific low-attenuation foci at the superior aspect of the liver, nonspecific, largest 10 mm. Musculoskeletal: Soft tissue gas identified at LEFT axilla likely related to recent vascular surgery LEFT arm. No acute osseous findings. Review of the MIP images confirms the above findings. IMPRESSION: No definite evidence of pulmonary embolism, with limited by respiratory motion. Aneurysmal dilatation of the thoracic aorta including ascending thoracic aorta 4.8 cm diameter, aortic root 5.1 cm diameter; recommend semi-annual imaging followup by CTA or MRA and referral to cardiothoracic surgery if not already obtained. This recommendation follows 2010 ACCF/AHA/AATS/ACR/ASA/SCA/SCAI/SIR/STS/SVM Guidelines for the Diagnosis and Management of Patients With Thoracic Aortic Disease. Circulation. 2010; 121:: N867-E720 Aortic aneurysm NOS (ICD10-I71.9) Small pericardial effusion. Dilated central pulmonary arteries consistent with pulmonary arterial hypertension, which also likely accounts for dilated RIGHT ventricle and RIGHT atrium. Minimal bibasilar effusions and atelectasis greater on RIGHT. Large LEFT upper quadrant mass 9.7 x 8.0 cm consistent with neoplasm arising from LEFT kidney, see assessment by prior PET-CT. Ventral hernia in RIGHT upper quadrant incompletely visualized. Aortic Atherosclerosis (ICD10-I70.0). Electronically Signed   By: MLavonia DanaM.D.   On: 04/30/2020 16:30   MR BRAIN WO CONTRAST  Result Date:  04/28/2020 CLINICAL DATA:  Recurrent syncope EXAM: MRI HEAD WITHOUT CONTRAST TECHNIQUE: Multiplanar, multiecho pulse sequences of the brain and surrounding structures were obtained without intravenous contrast. COMPARISON:  CT head 10/11/2016 FINDINGS: Brain: Generalized atrophy with prominent ventricles and subarachnoid space. Negative for acute infarct. Moderate white matter changes most consistent with chronic microvascular ischemia. Negative for hemorrhage or mass lesion. Vascular: Normal arterial flow voids. Skull and upper cervical spine: No focal skeletal lesion. Sinuses/Orbits: Paranasal sinuses clear. Cataract resection on the left. Other: None IMPRESSION: Atrophy and chronic microvascular ischemic change in the white matter. No acute abnormality. Electronically Signed   By: CFranchot GalloM.D.   On: 04/28/2020 20:18   DG Chest Portable 1 View  Result Date: 04/25/2020 CLINICAL DATA:  Shortness of breath EXAM: PORTABLE CHEST 1 VIEW COMPARISON:  04/20/2020 FINDINGS: Right dialysis catheter remains in place, unchanged. Marked cardiomegaly. No confluent opacities, effusions or edema. No acute bony abnormality. IMPRESSION: Stable cardiomegaly.  No active disease. Electronically Signed   By: KRolm BaptiseM.D.   On:  04/25/2020 22:48   EEG adult  Result Date: 05/01/2020 Lora Havens, MD     05/01/2020  5:00 PM Patient Name: [name] [name] MRN: [id number] Epilepsy Attending: Lora Havens Referring Physician/Provider: Dr Hosie Poisson Date: 05/01/2020 Duration: 23.05 mins Patient history: 74yo M with recurrent syncope. EEG to evaluate for seizure. Level of alertness: Awake AEDs during EEG study: None Technical aspects: This EEG study was done with scalp electrodes positioned according to the 10-20 International system of electrode placement. Electrical activity was acquired at a sampling rate of 500Hz  and reviewed with a high frequency filter of 70Hz  and a low frequency filter of 1Hz . EEG data were  recorded continuously and digitally stored. Description: The posterior dominant rhythm consists of 8 Hz activity of moderate voltage (25-35 uV) seen predominantly in posterior head regions, symmetric and reactive to eye opening and eye closing. Physiologic photic driving was seen during photic stimulation.  Hyperventilation was not performed.   IMPRESSION: This study is within normal limits. No seizures or epileptiform discharges were seen throughout the recording. Lora Havens   ECHOCARDIOGRAM COMPLETE  Result Date: 04/27/2020    ECHOCARDIOGRAM REPORT   Patient Name:   [name] [name] Date of Exam: 04/27/2020 Medical Rec #:  [id number]    Height:       69.0 in Accession #:    [id number]   Weight:       178.1 lb Date of Birth:  06-04-1946     BSA:          1.967 m Patient Age:    46 years     BP:           123/82 mmHg Patient Gender: M            HR:           69 bpm. Exam Location:  Inpatient Procedure: 2D Echo, Cardiac Doppler and Color Doppler                             MODIFIED REPORT: This report was modified by Cherlynn Kaiser MD on 04/27/2020 due to typo.  Indications:     R55 Syncope  History:         Patient has prior history of Echocardiogram examinations, most                  recent 07/28/2019. CHF, COPD, Arrythmias:Atrial Fibrillation;                  Risk Factors:Hypertension, Diabetes, Dyslipidemia and Sleep                  Apnea. CKD. Aortic Root Dilatation. Ascending Aortic Aneurysm.  Sonographer:     Jonelle Sidle Dance Referring Phys:  Theola Sequin Diagnosing Phys: Cherlynn Kaiser MD IMPRESSIONS  1. Right ventricular systolic function is mildly reduced. The right ventricular size is severely enlarged. There is severely elevated pulmonary artery systolic pressure. The estimated right ventricular systolic pressure is 70.3 mmHg.  2. Tricuspid valve regurgitation is severe.  3. The aortic valve is abnormal. Aortic valve regurgitation is moderate to severe. Mild aortic valve sclerosis is present,  with no evidence of aortic valve stenosis.  4. Aortic dilatation noted. 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  5. Left ventricular ejection fraction, by estimation, is 55 to 60%. The left ventricle has  normal function. The left ventricle has no regional wall motion abnormalities. There is mild left ventricular hypertrophy. Left ventricular diastolic parameters are consistent with Grade I diastolic dysfunction (impaired relaxation). There is the interventricular septum is flattened in systole, consistent with right ventricular pressure overload.  6. Left atrial size was moderately dilated.  7. Right atrial size was severely dilated.  8. The mitral valve is degenerative. Trivial mitral valve regurgitation. No evidence of mitral stenosis.  9. The inferior vena cava is dilated in size with <50% respiratory variability, suggesting right atrial pressure of 15 mmHg. Comparison(s): A prior study was performed on 07/28/2019. Prior images reviewed side by side. The right ventricular size has increased, tricuspid valve regurgitation is severe, and RVSP is severely elevated. Conclusion(s)/Recommendation(s): Consider evaluation for pulmonary embolism if not already performed and if clinically indicated. FINDINGS  Left Ventricle: Left ventricular ejection fraction, by estimation, is 55 to 60%. The left ventricle has normal function. The left ventricle has no regional wall motion abnormalities. The left ventricular internal cavity size was normal in size. There is  mild left ventricular hypertrophy. The interventricular septum is flattened in systole, consistent with right ventricular pressure overload. Left ventricular diastolic parameters are consistent with Grade I diastolic dysfunction (impaired relaxation). Right Ventricle: The right ventricular size is severely enlarged. No increase in right ventricular wall thickness. Right ventricular  systolic function is mildly reduced. There is severely elevated pulmonary artery systolic pressure. The tricuspid regurgitant velocity is 3.91 m/s, and with an assumed right atrial pressure of 15 mmHg, the estimated right ventricular systolic pressure is 37.4 mmHg. Left Atrium: Left atrial size was moderately dilated. Right Atrium: Right atrial size was severely dilated. Pericardium: A small pericardial effusion is present. Mitral Valve: The mitral valve is degenerative in appearance. Normal mobility of the mitral valve leaflets. Trivial mitral valve regurgitation. No evidence of mitral valve stenosis. Tricuspid Valve: The tricuspid valve is normal in structure. Tricuspid valve regurgitation is severe. No evidence of tricuspid stenosis. Aortic Valve: The aortic valve is abnormal. Aortic valve regurgitation is moderate to severe. Aortic regurgitation PHT measures 671 msec. Mild aortic valve sclerosis is present, with no evidence of aortic valve stenosis. There is mild calcification of the aortic valve. Pulmonic Valve: The pulmonic valve was normal in structure. Pulmonic valve regurgitation is mild. No evidence of pulmonic stenosis. Aorta: Aortic dilatation noted. 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 Venous: The inferior vena cava is dilated in size with less than 50% respiratory variability, suggesting right atrial pressure of 15 mmHg. IAS/Shunts: The interatrial septum is aneurysmal. There is left bowing of the interatrial septum, suggestive of elevated right atrial pressure. No atrial level shunt detected by color flow Doppler.  LEFT VENTRICLE PLAX 2D LVIDd:         5.40 cm LVIDs:         3.50 cm LV PW:         1.30 cm LV IVS:        1.20 cm LVOT diam:     2.40 cm LV SV:         118 LV SV Index:   60 LVOT Area:     4.52 cm  RIGHT VENTRICLE             IVC RV Basal diam:  5.20 cm     IVC diam: 3.20 cm RV Mid diam:    3.70 cm  RV S prime:     19.40 cm/s TAPSE (M-mode): 2.2 cm LEFT ATRIUM              Index       RIGHT ATRIUM           Index LA diam:        5.20 cm  2.64 cm/m  RA Area:     46.40 cm LA Vol (A2C):   137.0 ml 69.66 ml/m RA Volume:   252.00 ml 128.13 ml/m LA Vol (A4C):   53.4 ml  27.15 ml/m LA Biplane Vol: 81.9 ml  41.64 ml/m  AORTIC VALVE LVOT Vmax:   151.50 cm/s LVOT Vmean:  94.650 cm/s LVOT VTI:    0.262 m AI PHT:      671 msec  AORTA Ao Root diam: 4.70 cm Ao Asc diam:  4.90 cm MITRAL VALVE               TRICUSPID VALVE MV Area (PHT): 2.56 cm    TR Peak grad:   61.2 mmHg MV Decel Time: 296 msec    TR Vmax:        391.00 cm/s MV E velocity: 66.10 cm/s MV A velocity: 77.10 cm/s  SHUNTS MV E/A ratio:  0.86        Systemic VTI:  0.26 m                            Systemic Diam: 2.40 cm Cherlynn Kaiser MD Electronically signed by Cherlynn Kaiser MD Signature Date/Time: 04/27/2020/5:30:57 PM    Final (Updated)    VAS US CAROTID  Result Date: 05/01/2020 Carotid Arterial Duplex Study Indications:      Syncope. Risk Factors:     Hypertension, hyperlipidemia, Diabetes, coronary artery                   disease. Comparison Study: no prior Performing Technologist: Abram Sander RVS  Examination Guidelines: A complete evaluation includes B-mode imaging, spectral Doppler, color Doppler, and power Doppler as needed of all accessible portions of each vessel. Bilateral testing is considered an integral part of a complete examination. Limited examinations for reoccurring indications may be performed as noted.  Right Carotid Findings: +----------+--------+--------+--------+------------------+--------+           PSV cm/sEDV cm/sStenosisPlaque DescriptionComments +----------+--------+--------+--------+------------------+--------+ CCA Prox  65      11              heterogenous               +----------+--------+--------+--------+------------------+--------+ CCA Distal53      13              heterogenous                +----------+--------+--------+--------+------------------+--------+ ICA Prox  51      20      1-39%   heterogenous               +----------+--------+--------+--------+------------------+--------+ ICA Distal51      19                                         +----------+--------+--------+--------+------------------+--------+ ECA       50                                                 +----------+--------+--------+--------+------------------+--------+ +----------+--------+-------+--------+-------------------+  PSV cm/sEDV cmsDescribeArm Pressure (mmHG) +----------+--------+-------+--------+-------------------+ [id number]                                         +----------+--------+-------+--------+-------------------+ +---------+--------+--+--------+--+---------+ VertebralPSV cm/s59EDV cm/s26Antegrade +---------+--------+--+--------+--+---------+  Left Carotid Findings: +----------+--------+--------+--------+------------------+--------+           PSV cm/sEDV cm/sStenosisPlaque DescriptionComments +----------+--------+--------+--------+------------------+--------+ CCA Prox  71      12              heterogenous               +----------+--------+--------+--------+------------------+--------+ CCA Distal61      14              heterogenous               +----------+--------+--------+--------+------------------+--------+ ICA Prox  44      14      1-39%   heterogenous               +----------+--------+--------+--------+------------------+--------+ ICA Distal62      22                                         +----------+--------+--------+--------+------------------+--------+ ECA       52                                                 +----------+--------+--------+--------+------------------+--------+ +----------+--------+--------+--------+-------------------+           PSV cm/sEDV cm/sDescribeArm Pressure (mmHG)  +----------+--------+--------+--------+-------------------+ [id number]                                          +----------+--------+--------+--------+-------------------+ +---------+--------+--+--------+--+---------+ VertebralPSV cm/s46EDV cm/s17Antegrade +---------+--------+--+--------+--+---------+   Summary: Right Carotid: Velocities in the right ICA are consistent with a 1-39% stenosis. Left Carotid: Velocities in the left ICA are consistent with a 1-39% stenosis. Vertebrals: Bilateral vertebral arteries demonstrate antegrade flow. *See table(s) above for measurements and observations.  Electronically signed by Ruta Hinds MD on 05/01/2020 at 4:19:25 PM.    Final        Subjective: No new complaints.   Discharge Exam: Vitals:   05/02/20 1121 05/02/20 1215  BP: (!) 145/87 126/86  Pulse: 84 90  Resp: 18 18  Temp: 97.7 F (36.5 C) 98 F (36.7 C)  SpO2: 99% 99%   Vitals:   05/02/20 1030 05/02/20 1100 05/02/20 1121 05/02/20 1215  BP: (!) 137/91 (!) 146/96 (!) 145/87 126/86  Pulse: 83 83 84 90  Resp: 20 (!) 22 18 18   Temp:   97.7 F (36.5 C) 98 F (36.7 C)  TempSrc:   Oral Oral  SpO2:   99% 99%  Weight:      Height:        General: Pt is alert, awake, not in acute distress Cardiovascular: RRR, S1/S2 +, no rubs, no gallops Respiratory: CTA bilaterally, no wheezing, no rhonchi Abdominal: Soft, NT, ND, bowel sounds + Extremities: no edema, no cyanosis    The results of significant diagnostics from this hospitalization (including imaging, microbiology, ancillary and laboratory) are listed below for reference.     Microbiology:  Recent Results (from the past 240 hour(s))  SARS Coronavirus 2 by RT PCR (hospital order, performed in [facility] hospital lab) Nasopharyngeal Nasopharyngeal Swab     Status: None   Collection Time: 04/26/20 12:00 AM   Specimen: Nasopharyngeal Swab  Result Value Ref Range Status   SARS Coronavirus 2 NEGATIVE NEGATIVE Final     Comment: (NOTE) SARS-CoV-2 target nucleic acids are NOT DETECTED.  The SARS-CoV-2 RNA is generally detectable in upper and lower respiratory specimens during the acute phase of infection. The lowest concentration of SARS-CoV-2 viral copies this assay can detect is 250 copies / mL. A negative result does not preclude SARS-CoV-2 infection and should not be used as the sole basis for treatment or other patient management decisions.  A negative result may occur with improper specimen collection / handling, submission of specimen other than nasopharyngeal swab, presence of viral mutation(s) within the areas targeted by this assay, and inadequate number of viral copies (<250 copies / mL). A negative result must be combined with clinical observations, patient history, and epidemiological information.  Fact Sheet for Patients:   StrictlyIdeas.no  Fact Sheet for Healthcare Providers: BankingDealers.co.za  This test is not yet approved or  cleared by the [place] FDA and has been authorized for detection and/or diagnosis of SARS-CoV-2 by FDA under an Emergency Use Authorization (EUA).  This EUA will remain in effect (meaning this test can be used) for the duration of the COVID-19 declaration under Section 564(b)(1) of the Act, 21 U.S.C. section 360bbb-3(b)(1), unless the authorization is terminated or revoked sooner.  Performed at [facility], [place] [address] [place], [place] [address]   MRSA PCR Screening     Status: None   Collection Time: 04/27/20  4:38 AM   Specimen: Nasal Mucosa; Nasopharyngeal  Result Value Ref Range Status   MRSA by PCR NEGATIVE NEGATIVE Final    Comment:        The GeneXpert MRSA Assay (FDA approved for NASAL specimens only), is one component of a comprehensive MRSA colonization surveillance program. It is not intended to diagnose MRSA infection nor to guide or monitor treatment for MRSA  infections. Performed at [facility], [place] [address] [place], [place] [address]   SARS Coronavirus 2 by RT PCR (hospital order, performed in [facility] hospital lab) Nasopharyngeal Nasopharyngeal Swab     Status: None   Collection Time: 05/01/20 12:33 PM   Specimen: Nasopharyngeal Swab  Result Value Ref Range Status   SARS Coronavirus 2 NEGATIVE NEGATIVE Final    Comment: (NOTE) SARS-CoV-2 target nucleic acids are NOT DETECTED.  The SARS-CoV-2 RNA is generally detectable in upper and lower respiratory specimens during the acute phase of infection. The lowest concentration of SARS-CoV-2 viral copies this assay can detect is 250 copies / mL. A negative result does not preclude SARS-CoV-2 infection and should not be used as the sole basis for treatment or other patient management decisions.  A negative result may occur with improper specimen collection / handling, submission of specimen other than nasopharyngeal swab, presence of viral mutation(s) within the areas targeted by this assay, and inadequate number of viral copies (<250 copies / mL). A negative result must be combined with clinical observations, patient history, and epidemiological information.  Fact Sheet for Patients:   StrictlyIdeas.no  Fact Sheet for Healthcare Providers: BankingDealers.co.za  This test is not yet approved or  cleared by the [place] FDA and has been authorized for detection and/or diagnosis of SARS-CoV-2  by FDA under an Emergency Use Authorization (EUA).  This EUA will remain in effect (meaning this test can be used) for the duration of the COVID-19 declaration under Section 564(b)(1) of the Act, 21 U.S.C. section 360bbb-3(b)(1), unless the authorization is terminated or revoked sooner.  Performed at [facility], [place] [address], [place], [place] [address]      Labs: BNP (last 3 results) No results for input(s): BNP in the last  8760 hours. Basic Metabolic Panel: Recent Labs  Lab 04/25/20 1429 04/25/20 1429 04/25/20 1438 04/26/20 1005 04/27/20 1733 04/29/20 0417 05/02/20 0811  NA 133*   < > 128* 132* 133* 135 128*  K 5.5*   < > 5.5* 5.5* 4.6 3.9 5.2*  CL 92*   < > 96* 92* 95* 96* 92*  CO2 21*  --   --  23 22 25 25   GLUCOSE 118*   < > 115* 87 118* 133* 87  BUN 67*   < > 67* 73* 38* 26* 30*  CREATININE 13.66*   < > 14.80* 14.17* 9.65* 8.11* 8.65*  CALCIUM 10.2  --   --  10.0 9.5 9.2 10.1  MG  --   --   --   --   --  2.1  --   PHOS  --   --   --  9.7* 6.1*  --  5.6*   < > = values in this interval not displayed.   Liver Function Tests: Recent Labs  Lab 04/25/20 2336 04/26/20 1005 04/27/20 1733 05/02/20 0811  AST 21  --   --   --   ALT 14  --   --   --   ALKPHOS 55  --   --   --   BILITOT 1.0  --   --   --   PROT 6.6  --   --   --   ALBUMIN 3.3* 2.8* 2.8* 2.7*   Recent Labs  Lab 04/25/20 2336  LIPASE 29   No results for input(s): AMMONIA in the last 168 hours. CBC: Recent Labs  Lab 04/26/20 0543 04/26/20 0543 04/26/20 1004 04/26/20 1004 04/27/20 1402 04/29/20 0750 04/30/20 0849 05/01/20 1038 05/02/20 0811  WBC 5.0  --  6.5  --  7.6 6.4  --   --  7.1  NEUTROABS  --   --   --   --  5.5  --   --   --   --   HGB 7.2*   < > 7.5*   < > 7.9* 7.3* 7.9* 8.3* 7.1*  HCT 22.4*   < > 24.3*   < > 25.9* 24.5* 26.4* 27.1* 23.6*  MCV 99.1  --  101.7*  --  101.2* 104.7*  --   --  104.0*  PLT 156  --  174  --  159 154  --   --  175   < > = values in this interval not displayed.   Cardiac Enzymes: No results for input(s): CKTOTAL, CKMB, CKMBINDEX, TROPONINI in the last 168 hours. BNP: Invalid input(s): POCBNP CBG: Recent Labs  Lab 04/28/20 1304 04/29/20 0144 04/29/20 0639 05/01/20 0637 05/02/20 0627  GLUCAP 92 112* 99 90 89   D-Dimer No results for input(s): DDIMER in the last 72 hours. Hgb A1c No results for input(s): HGBA1C in the last 72 hours. Lipid Profile No results for  input(s): CHOL, HDL, LDLCALC, TRIG, CHOLHDL, LDLDIRECT in the last 72 hours. Thyroid function studies No results for input(s):  TSH, T4TOTAL, T3FREE, THYROIDAB in the last 72 hours.  Invalid input(s): FREET3 Anemia work up No results for input(s): VITAMINB12, FOLATE, FERRITIN, TIBC, IRON, RETICCTPCT in the last 72 hours. Urinalysis    Component Value Date/Time   COLORURINE STRAW (A) 02/25/2020 2318   APPEARANCEUR CLEAR 02/25/2020 2318   LABSPEC 1.006 02/25/2020 2318   PHURINE 8.0 02/25/2020 2318   GLUCOSEU NEGATIVE 02/25/2020 2318   HGBUR MODERATE (A) 02/25/2020 2318   [place] NEGATIVE 02/25/2020 2318   [place] 02/25/2020 2318   PROTEINUR 100 (A) 02/25/2020 2318   NITRITE NEGATIVE 02/25/2020 2318   LEUKOCYTESUR NEGATIVE 02/25/2020 2318   Sepsis Labs Invalid input(s): PROCALCITONIN,  WBC,  LACTICIDVEN Microbiology Recent Results (from the past 240 hour(s))  SARS Coronavirus 2 by RT PCR (hospital order, performed in [facility] hospital lab) Nasopharyngeal Nasopharyngeal Swab     Status: None   Collection Time: 04/26/20 12:00 AM   Specimen: Nasopharyngeal Swab  Result Value Ref Range Status   SARS Coronavirus 2 NEGATIVE NEGATIVE Final    Comment: (NOTE) SARS-CoV-2 target nucleic acids are NOT DETECTED.  The SARS-CoV-2 RNA is generally detectable in upper and lower respiratory specimens during the acute phase of infection. The lowest concentration of SARS-CoV-2 viral copies this assay can detect is 250 copies / mL. A negative result does not preclude SARS-CoV-2 infection and should not be used as the sole basis for treatment or other patient management decisions.  A negative result may occur with improper specimen collection / handling, submission of specimen other than nasopharyngeal swab, presence of viral mutation(s) within the areas targeted by this assay, and inadequate number of viral copies (<250 copies / mL). A negative result must be combined with  clinical observations, patient history, and epidemiological information.  Fact Sheet for Patients:   StrictlyIdeas.no  Fact Sheet for Healthcare Providers: BankingDealers.co.za  This test is not yet approved or  cleared by the [place] FDA and has been authorized for detection and/or diagnosis of SARS-CoV-2 by FDA under an Emergency Use Authorization (EUA).  This EUA will remain in effect (meaning this test can be used) for the duration of the COVID-19 declaration under Section 564(b)(1) of the Act, 21 U.S.C. section 360bbb-3(b)(1), unless the authorization is terminated or revoked sooner.  Performed at [facility], [name] [address], [place], [place] [address]   MRSA PCR Screening     Status: None   Collection Time: 04/27/20  4:38 AM   Specimen: Nasal Mucosa; Nasopharyngeal  Result Value Ref Range Status   MRSA by PCR NEGATIVE NEGATIVE Final    Comment:        The GeneXpert MRSA Assay (FDA approved for NASAL specimens only), is one component of a comprehensive MRSA colonization surveillance program. It is not intended to diagnose MRSA infection nor to guide or monitor treatment for MRSA infections. Performed at [facility], [place] [address], [place], [place] [address]   SARS Coronavirus 2 by RT PCR (hospital order, performed in [facility] hospital lab) Nasopharyngeal Nasopharyngeal Swab     Status: None   Collection Time: 05/01/20 12:33 PM   Specimen: Nasopharyngeal Swab  Result Value Ref Range Status   SARS Coronavirus 2 NEGATIVE NEGATIVE Final    Comment: (NOTE) SARS-CoV-2 target nucleic acids are NOT DETECTED.  The SARS-CoV-2 RNA is generally detectable in upper and lower respiratory specimens during the acute phase of infection. The lowest concentration of SARS-CoV-2 viral copies this assay can detect is 250 copies / mL.  A negative result does not preclude SARS-CoV-2 infection and should not be used as  the sole basis for treatment or other patient management decisions.  A negative result may occur with improper specimen collection / handling, submission of specimen other than nasopharyngeal swab, presence of viral mutation(s) within the areas targeted by this assay, and inadequate number of viral copies (<250 copies / mL). A negative result must be combined with clinical observations, patient history, and epidemiological information.  Fact Sheet for Patients:   StrictlyIdeas.no  Fact Sheet for Healthcare Providers: BankingDealers.co.za  This test is not yet approved or  cleared by the [place] FDA and has been authorized for detection and/or diagnosis of SARS-CoV-2 by FDA under an Emergency Use Authorization (EUA).  This EUA will remain in effect (meaning this test can be used) for the duration of the COVID-19 declaration under Section 564(b)(1) of the Act, 21 U.S.C. section 360bbb-3(b)(1), unless the authorization is terminated or revoked sooner.  Performed at [facility], [place] [address] [place], [place] [address]      Time coordinating discharge: 42 minutes  SIGNED:   Hosie Poisson, MD  [facility] 05/02/2020, 12:49 PM

## 2020-05-02 NOTE — TOC Transition Note (Signed)
Transition of Care (TOC) - CM/SW Discharge Note *Discharged to [facility] *Room 207 *Number for Report - [phone]   Patient Details  Name: [name] [name] MRN: [id number] Date of Birth: [birth year]  Transition of Care [facility]) CM/SW Contact:  Sable Feil, LCSW Phone Number: 05/02/2020, 4:05 PM   Clinical Narrative:  Patient medically stable for discharge and is going to [facility] today. Discharge clinicals transmitted to facility, [employer] contacted and received Pending Reference [phone] [facility] Pre-Auth waiver in place). Son Rasmus Preusser [phone]) contacted and informed of discharge and was agreeable to going to SNF to complete patient's admissions paperwork.     Final next level of care: [facility]) Barriers to Discharge: Barriers Resolved   Patient Goals and CMS Choice Patient states their goals for this hospitalization and ongoing recovery are:: Patient and son agreeable to SNF placement for rehab and then transitioning to LTC CMS Medicare.gov Compare Post Acute Care list provided to:: Patient (Patient provided with Medicare.gov SNF list) Choice offered to / list presented to : Patient, Adult Children  Discharge Placement PASRR number recieved: 04/28/20            Patient chooses bed at: [facility] Patient to be transferred to facility by: Non-emergency ambulance transport Name of family member notified: Eden Emms - [phone] Patient and family notified of of transfer: 05/02/20  Discharge Plan and Services In-house Referral: Clinical Social Work                                  Social Determinants of Health (SDOH) Interventions  No SDOH interventions requested or needed at discharge   Readmission Risk Interventions Readmission Risk Prevention Plan 02/09/2020 06/29/2019  Transportation Screening Complete Complete  Medication Review Press photographer) Referral to Pharmacy Complete  PCP or  Specialist appointment within 3-5 days of discharge Complete Complete  HRI or [place] Complete Complete  SW Recovery Care/Counseling Consult Complete Complete  Palliative Care Screening Not Applicable Not [place] Not Applicable Not Applicable  Some recent data might be hidden

## 2020-05-02 NOTE — Consult Note (Signed)
Cardiology Consultation:   Patient ID: ATA PECHA MRN: [id number]; DOB: May 31, 1946  Admit date: 04/25/2020 Date of Consult: 05/02/2020  Primary Care Provider: [name], DO [facility] Cardiologist: Minus Breeding, MD  [facility] Electrophysiologist:  None    Patient Profile:   [name] [name] is a 74 y.o. male with a hx of ESRD on HD, ascending aortic aneurysm, multiple myeloma, paroxysmal atrial fibrillation, hypertension, severe pulmonary hypertension, and medically managed CAD who is being seen today for the evaluation of recurrent syncope at the request of Dr. Karleen Hampshire.  History of Present Illness:   [name] [name] was admitted 8/24 after multiple episodes of syncope.  He had not been to dialysis for the preceding 5 days.  He reported that he was too weak to make it downstairs from his son's home.  He passed out multiple times and EMS was called.  He had no chest pain or palpitations preceding the episodes.  He notes that he feels lightheaded before it occurs.  Its not necessarily upon standing.  It seems to happen while he is walking and his legs just get weak and he passes out.  He denied any head trauma.  His symptoms were initially attributed to uremia and missing dialysis.  Admission labs were notable for sodium 133, potassium 5.5, BUN 67 (which is around his baseline).  EEG was negative for seizure.  He had an echo this admission that revealed LVEF 55-60% with mild LVH and grade 1 diastolic dysfunction.  There was mildly reduced RV function and severe pulmonary hypertension with PASP 76 mmHg. RA pressure was 15 mmHg.  He notes that he doesn't use his CPAP machine at home and he had "forgotten about it."   Ct-A was negative for PE this admission.    [name] [name] reports that he had another episode of syncope when trying to get himself from his chair to his bed.  He was alone at the time and denies any preceding symptoms and reports that he passed out. There were no  arrhythmias on telemetry throughout his hospitalization.  I don't see report of this event.  There is a report that his eyes rolled to the back of his head while having a bowel movement.  He worked with PT on 8/30 and had fatigue after exercising. BP dropped to 71/50.  HR was 83.  Orthostatic vitals were as documented by PT: VITALS:  - supine - BP: 120/84 (93); HR: 71bpm - sitting EOB - BP: 93/70 (78); HR: 80bpm - sitting (post seated exercises) - BP: 105/81 (90); HR: 99bpm - standing - BP: 115/80 (89); HR: 88bpm - sitting in recliner (post ambulation) - 71/50 (55); HR: 83bpm - sitting in recliner (end of session) - 133/90 (103); HR: 68bpm  At home [name] [name] takes amlodipine 26m on non-HD days if his BP is >150.  He doesn't take any BP medication on HD days.     Past Medical History:  Diagnosis Date  . Abnormal CT of liver    a. nodular contour suggesting cirrhosis 05/2018.  .Marland KitchenAbnormal LFTs (liver function tests)   . Anemia   . Anxiety   . Aortic root dilatation ([place]   . Ascending aortic aneurysm ([place]   . BPH with urinary obstruction   . Bradycardia   . C5-C7 level with spinal cord injury with central cord syndrome, without evidence of spinal bone injury (HW[place] 10/12/2016  . CAD (coronary artery disease)    a. 12/2015 NSTEMI/Cath (in setting of PAF):  LM nl, LAD 30p,  LCX 24m RCA  ok, AM 100%, RPDA1 40, RPDA 2 60 ->Med Rx.  . Childhood asthma   . Chronic diastolic CHF (congestive heart failure) ([place]    a. 12/2015 Echo: EF 50-55%, mod AI, mod Ao root dil, mild MR, mod dil LA, mod RA.  . CKD (chronic kidney disease), stage III   . Colon cancer ([place]   . COPD (chronic obstructive pulmonary disease) ([place]    pt denies at preop  . Diabetes mellitus type II    diet controlled  . Dysrhythmia   . History of syncope   . Hyperlipidemia   . Hypertension   . Hypertensive heart disease   . Kidney lump 04/04/2010   Overview:  Renal Cell Carcinoma   . Light chain myeloma ([place]   . Moderate  aortic insufficiency    a. 12/2015 Echo: Mod AI.  .Marland KitchenParoxysmal atrial fibrillation ([place]    a. 12/2015 started on Xarelto (CHA2DS2VASc = 4-5).  . Peripheral vascular disease ([place]   . Pneumonia   . Prostate cancer ([facility]   . Renal cell carcinoma (H[place]   . Sleep apnea    Does not like  CPAP  . Spinal stenosis in cervical region 10/12/2016  . Venous insufficiency     Past Surgical History:  Procedure Laterality Date  . AV FISTULA PLACEMENT Left 01/26/2020   Procedure: left ARTERIOVENOUS (AV) FISTULA CREATION;  Surgeon: CWaynetta Sandy MD;  [place] [facility]  Service: Vascular;  Laterality: Left;  . BACK SURGERY    . BASCILIC VEIN TRANSPOSITION Left 04/28/2020   Procedure: LEFT ARM SECOND STAGE BASILIC VEIN TRANSPOSITION;  Surgeon: CWaynetta Sandy MD;  [place] [facility]  Service: Vascular;  Laterality: Left;  . CARDIAC CATHETERIZATION N/A 12/18/2015   Procedure: Left Heart Cath and Coronary Angiography;  Surgeon: DLeonie Man MD;  [facility];  Service: Cardiovascular;  Laterality: N/A;  . DIAGNOSTIC LAPAROSCOPY     partial colectomy  . IR FLUORO GUIDE CV LINE RIGHT  06/28/2019  . IR FLUORO GUIDE CV LINE RIGHT  01/24/2020  . IR REMOVAL TUN CV CATH W/O FL  07/26/2019  . IR [place] ACCESS RIGHT  06/28/2019  . IR [place] VASC ACCESS RIGHT  01/24/2020  . LAPAROSCOPIC PARTIAL COLECTOMY Right 05/21/2018   Procedure: LAPAROSCOPIC RIGHT  COLECTOMY ERAS PATHWAY;  Surgeon: TLeighton Ruff MD;  [facility];  Service: General;  Laterality: Right;  . [place]  "replaced a disc"     Home Medications:  Prior to Admission medications   Medication Sig Start Date End Date Taking? Authorizing Provider  amLODipine (NORVASC) 10 MG tablet Take 1 tablet (10 mg total) by mouth daily. 01/12/20  Yes WNicolette Bang DO  atorvastatin (LIPITOR) 40 MG tablet Take 40 mg by mouth daily as needed (pt states he only takes when his cholesterol  is high).    Yes [provider]  AURYXIA 1 GM 210 MG(Fe) tablet Take 420 mg by mouth 3 (three) times daily. 03/23/20  Yes [provider]  cetirizine (ZYRTEC) 10 MG tablet TAKE 1 TABLET (10 MG TOTAL) BY MOUTH DAILY AS NEEDED (SEASONAL ALLERGIES). Patient taking differently: Take 10 mg by mouth daily as needed for allergies (seasonal allergies).  11/12/19  Yes WNicolette Bang DO  diclofenac Sodium (VOLTAREN) 1 % GEL Apply 2 g topically 4 (four) times daily. Patient taking differently: Apply 2 g topically 4 (four)  times daily as needed (pain).  03/03/20  Yes Dagoberto Ligas, PA-C  HYDROcodone-acetaminophen (NORCO/VICODIN) 5-325 MG tablet Take 1-2 tablets by mouth every 6 (six) hours as needed (gout pain). 12/29/19  Yes Bast, Traci A, NP  ixazomib citrate (NINLARO) 3 MG capsule Take 1 capsule (3 mg) by mouth weekly, 3 weeks on, 1 week off, repeat every 4 weeks. Take on an empty stomach 1hr before or 2hr after meals. Patient taking differently: Take 3 mg by mouth See admin instructions. Take 1 capsule (3 mg) by mouth weekly, 3 weeks on, 1 week off, repeat every 4 weeks. Take on an empty stomach 1hr before or 2hr after meals. 03/10/20  Yes Brunetta Genera, MD  tamsulosin (FLOMAX) 0.4 MG CAPS capsule Take 1 capsule (0.4 mg total) by mouth daily. 10/15/19  Yes [name], DO  glucose blood (ONETOUCH ULTRA) test strip Check FSBS twice a day. Dx: E11.21, N18.32 Patient taking differently: 1 each by Other route See admin instructions. Check FSBS twice a day. Dx: E11.21, N18.32 08/16/19   Fulp, Ander Gaster, MD  oxyCODONE-acetaminophen (PERCOCET/ROXICET) 5-325 MG tablet Take 1 tablet by mouth every 6 (six) hours as needed for moderate pain or severe pain. Patient not taking: Reported on 04/20/2020 03/29/20   Brunetta Genera, MD    Inpatient Medications: Scheduled Meds: . amLODipine  5 mg Oral Daily  . Chlorhexidine Gluconate Cloth  6 each Topical Daily  .  Chlorhexidine Gluconate Cloth  6 each Topical Q0600  . darbepoetin (ARANESP) injection - DIALYSIS  200 mcg Intravenous Q Thu-HD  . ferric citrate  420 mg Oral TID WC  . heparin  5,000 Units Subcutaneous Q8H  . melatonin  3 mg Oral QHS  . sodium chloride flush  3 mL Intravenous Q12H  . sodium chloride flush  3 mL Intravenous Q12H  . tamsulosin  0.4 mg Oral Daily   Continuous Infusions: . sodium chloride     PRN Meds: sodium chloride, acetaminophen **OR** acetaminophen, atorvastatin, HYDROcodone-acetaminophen, senna-docusate, sodium chloride flush  Allergies:    Allergies  Allergen Reactions  . Amoxicillin Other (See Comments)    Tolerates Unasyn. Can't move Has patient had a PCN reaction causing immediate rash, facial/tongue/throat swelling, SOB or lightheadedness with hypotension: No Has patient had a PCN reaction causing severe rash involving mucus membranes or skin necrosis: No Has patient had a PCN reaction that required hospitalization No Has patient had a PCN reaction occurring within the last 10 years: No If all of the above answers are "NO", then may proceed with Cephalosporin use.   [name] Penicillins Other (See Comments)    Tolerates Unasyn. Can't move/dizziness Has patient had a PCN reaction causing immediate rash, facial/tongue/throat swelling, SOB or lightheadedness with hypotension: No Has patient had a PCN reaction causing severe rash involving mucus membranes or skin necrosis: No Has patient had a PCN reaction that required hospitalization No Has patient had a PCN reaction occurring within the last 10 years: No If all of the above answers are "NO", then may proceed with Cephalosporin use.      Social History:   Social History   Socioeconomic History  . Marital status: Widowed    Spouse name: Not on file  . Number of children: Not on file  . Years of education: Not on file  . Highest education level: Not on file  Occupational History  . Not on file  Tobacco  Use  . Smoking status: Former Smoker    Types: Cigars  Quit date: 09/02/1977    Years since quitting: 42.6  . Smokeless tobacco: Never Used  . Tobacco comment:    Vaping Use  . Vaping Use: Never used  Substance and Sexual Activity  . Alcohol use: Not Currently    Comment: "stopped drinking alcohol in ~ 1980; just drank a little on the weekends when I did drink"  . Drug use: No  . Sexual activity: Not Currently  Other Topics Concern  . Not on file  Social History Narrative   Lives alone.  Wife died in 2023/03/25.  Lives in apartment.     Social Determinants of Health   Financial Resource Strain:   . Difficulty of Paying Living Expenses: Not on file  Food Insecurity:   . Worried About Charity fundraiser in the Last Year: Not on file  . Ran Out of Food in the Last Year: Not on file  Transportation Needs:   . Lack of Transportation (Medical): Not on file  . Lack of Transportation (Non-Medical): Not on file  Physical Activity:   . Days of Exercise per Week: Not on file  . Minutes of Exercise per Session: Not on file  Stress:   . Feeling of Stress : Not on file  Social Connections:   . Frequency of Communication with Friends and Family: Not on file  . Frequency of Social Gatherings with Friends and Family: Not on file  . Attends Religious Services: Not on file  . Active Member of Clubs or Organizations: Not on file  . Attends Archivist Meetings: Not on file  . Marital Status: Not on file  Intimate Partner Violence:   . Fear of Current or Ex-Partner: Not on file  . Emotionally Abused: Not on file  . Physically Abused: Not on file  . Sexually Abused: Not on file    Family History:    Family History  Problem Relation Age of Onset  . Emphysema Father   . Asthma Father   . Liver disease Father        tumor  . Heart disease Mother   . Hypertension Mother   . Asthma Sister   . Hypertension Son      ROS:  Please see the history of present illness.   All other  ROS reviewed and negative.     Physical Exam/Data:   Vitals:   05/01/20 0956 05/01/20 1654 05/01/20 2049 05/02/20 0445  BP: 120/84 (!) 137/91 122/72 (!) 136/92  Pulse: 80 75 72 74  Resp: _0 Temp: 97.9 F (36.6 C) 98.8 F (37.1 C) 98.8 F (37.1 C) 97.9 F (36.6 C)  TempSrc: Oral Oral Oral Oral  SpO2: 100% 98% 98% 98%  Weight:      Height:        Intake/Output Summary (Last 24 hours) at 05/02/2020 0756 Last data filed at 05/01/2020 2313 Gross per 24 hour  Intake 600 ml  Output --  Net 600 ml   Last 3 Weights 05/01/2020 04/30/2020 04/29/2020  Weight (lbs) 188 lb 7.9 oz 188 lb 7.9 oz 188 lb 9.6 oz  Weight (kg) 85.5 kg 85.5 kg 85.548 kg     VS:  BP (!) 146/96 (BP Location: Right Arm)   Pulse 83   Temp (!) 97.4 F (36.3 C) (Oral)   Resp (!) 22   Ht _1  (1.753 m)   Wt 86.4 kg   SpO2 99%   BMI 28.13 kg/m  , BMI Body mass index  is 28.13 kg/m. GENERAL:  Chronically ill-appearing  HEENT: Pupils equal round and reactive, fundi not visualized, oral mucosa unremarkable NECK:  No jugular venous distention, waveform within normal limits, carotid upstroke brisk and symmetric, no bruits LUNGS:  Clear to auscultation bilaterally HEART:  RRR.  PMI not displaced or sustained,S1 and S2 within normal limits, no S3, no S4, no clicks, no rubs, III/VI systolic murmurs ABD:  Flat, positive bowel sounds normal in frequency in pitch, no bruits, no rebound, no guarding, no midline pulsatile mass, no hepatomegaly, no splenomegaly EXT:  2 plus pulses throughout, no edema, no cyanosis no clubbing SKIN:  No rashes no nodules NEURO:  Cranial nerves II through XII grossly intact, motor grossly intact throughout PSYCH:  Cognitively intact, oriented to person place and time   EKG:  The EKG was personally reviewed and demonstrates:   Sinus rhythm/sinus arrhythmia.  Rate 80 bpm.  PVCs.  LAFB.  Anterior lateral T wave abnormalities..   Telemetry:  Telemetry was personally reviewed and  demonstrates: sinus rhythm, PVCs, PACs  Relevant CV Studies: Echo 04/27/20: 1. Right ventricular systolic function is mildly reduced. The right  ventricular size is severely enlarged. There is severely elevated  pulmonary artery systolic pressure. The estimated right ventricular  systolic pressure is 97.9 mmHg.  2. Tricuspid valve regurgitation is severe.  3. The aortic valve is abnormal. Aortic valve regurgitation is moderate  to severe. Mild aortic valve sclerosis is present, with no evidence of  aortic valve stenosis.  4. Aortic dilatation noted. There is moderate to severe dilatation of the  aortic root and of the ascending aorta measuring 47 mm and 49 mm  respectively.  5. Left ventricular ejection fraction, by estimation, is 55 to 60%. The  left ventricle has normal function. The left ventricle has no regional  wall motion abnormalities. There is mild left ventricular hypertrophy.  Left ventricular diastolic parameters  are consistent with Grade I diastolic dysfunction (impaired relaxation).  There is the interventricular septum is flattened in systole, consistent  with right ventricular pressure overload.  6. Left atrial size was moderately dilated.  7. Right atrial size was severely dilated.  8. The mitral valve is degenerative. Trivial mitral valve regurgitation.  No evidence of mitral stenosis.  9. The inferior vena cava is dilated in size with <50% respiratory  variability, suggesting right atrial pressure of 15 mmHg.   Laboratory Data:  High Sensitivity Troponin:   Recent Labs  Lab 04/25/20 2336 04/26/20 0543  TROPONINIHS 79* 64*     Chemistry Recent Labs  Lab 04/26/20 1005 04/27/20 1733 04/29/20 0417  NA 132* 133* 135  K 5.5* 4.6 3.9  CL 92* 95* 96*  CO2 _0 GLUCOSE 87 118* 133*  BUN 73* 38* 26*  CREATININE 14.17* 9.65* 8.11*  CALCIUM 10.0 9.5 9.2  GFRNONAA 3* 5* 6*  GFRAA 3* 5* 7*  ANIONGAP 17* 16* 14    Recent Labs  Lab  04/25/20 2336 04/26/20 1005 04/27/20 1733  PROT 6.6  --   --   ALBUMIN 3.3* 2.8* 2.8*  AST 21  --   --   ALT 14  --   --   ALKPHOS 55  --   --   BILITOT 1.0  --   --    Hematology Recent Labs  Lab 04/26/20 1004 04/26/20 1004 04/27/20 1402 04/27/20 1402 04/29/20 0750 04/30/20 0849 05/01/20 1038  WBC 6.5  --  7.6  --  6.4  --   --  RBC 2.39*  --  2.56*  --  2.34*  --   --   HGB 7.5*   < > 7.9*   < > 7.3* 7.9* 8.3*  HCT 24.3*   < > 25.9*   < > 24.5* 26.4* 27.1*  MCV 101.7*  --  101.2*  --  104.7*  --   --   MCH 31.4  --  30.9  --  31.2  --   --   MCHC 30.9  --  30.5  --  29.8*  --   --   RDW 20.9*  --  20.9*  --  21.7*  --   --   PLT 174  --  159  --  154  --   --    < > = values in this interval not displayed.   BNPNo results for input(s): BNP, PROBNP in the last 168 hours.  DDimer No results for input(s): DDIMER in the last 168 hours.   Radiology/Studies:  CT ANGIO CHEST PE W OR WO CONTRAST  Result Date: 04/30/2020 CLINICAL DATA:  Shortness of breath, question pulmonary embolism, negative D-dimer EXAM: CT ANGIOGRAPHY CHEST WITH CONTRAST TECHNIQUE: Multidetector CT imaging of the chest was performed using the standard protocol during bolus administration of intravenous contrast. Multiplanar CT image reconstructions and MIPs were obtained to evaluate the vascular anatomy. CONTRAST:  6m OMNIPAQUE IOHEXOL 350 MG/ML SOLN Sagittal and coronal MPR images reconstructed from axial data set. COMPARISON:  05/19/2018 FINDINGS: Cardiovascular: Atherosclerotic calcifications aorta, coronary arteries, proximal great vessels. Aneurysmal dilatation ascending thoracic aorta 4.8 cm diameter. Aortic root 5.1 cm diameter. No evidence of aortic dissection. Dilated central pulmonary arteries, main pulmonary artery 4.9 cm diameter question pulmonary arterial hypertension. Pulmonary arteries well opacified. Pulmonary arterial assessment in the lower lobes and in a portion of the upper lobes is  limited by respiratory motion. No definite pulmonary emboli identified. Enlargement of cardiac chambers especially RIGHT ventricle and RIGHT atrium. Small pericardial effusion. Mediastinum/Nodes: Esophagus unremarkable. No thoracic adenopathy. Base of cervical region normal appearance. Lungs/Pleura: Minimal bibasilar effusions and atelectasis greater on RIGHT. Remaining lungs clear. Upper Abdomen: Large solid soft tissue mass identified in LEFT upper quadrant, by prior PET-CT arising from LEFT kidney consistent with neoplasm, visualized portion measuring 9.7 x 8.0 cm. Two small hyperdense nodules are seen within RIGHT kidney, larger 2.2 cm greatest size. Ventral hernia in RIGHT upper quadrant incompletely visualized. Probable additional cysts in both kidneys. Few nonspecific low-attenuation foci at the superior aspect of the liver, nonspecific, largest 10 mm. Musculoskeletal: Soft tissue gas identified at LEFT axilla likely related to recent vascular surgery LEFT arm. No acute osseous findings. Review of the MIP images confirms the above findings. IMPRESSION: No definite evidence of pulmonary embolism, with limited by respiratory motion. Aneurysmal dilatation of the thoracic aorta including ascending thoracic aorta 4.8 cm diameter, aortic root 5.1 cm diameter; recommend semi-annual imaging followup by CTA or MRA and referral to cardiothoracic surgery if not already obtained. This recommendation follows 2010 ACCF/AHA/AATS/ACR/ASA/SCA/SCAI/SIR/STS/SVM Guidelines for the Diagnosis and Management of Patients With Thoracic Aortic Disease. Circulation. 2010; 121:: W237-S283 Aortic aneurysm NOS (ICD10-I71.9) Small pericardial effusion. Dilated central pulmonary arteries consistent with pulmonary arterial hypertension, which also likely accounts for dilated RIGHT ventricle and RIGHT atrium. Minimal bibasilar effusions and atelectasis greater on RIGHT. Large LEFT upper quadrant mass 9.7 x 8.0 cm consistent with neoplasm  arising from LEFT kidney, see assessment by prior PET-CT. Ventral hernia in RIGHT upper quadrant incompletely visualized. Aortic Atherosclerosis (ICD10-I70.0). Electronically  Signed   By: Lavonia Dana M.D.   On: 04/30/2020 16:30   MR BRAIN WO CONTRAST  Result Date: 04/28/2020 CLINICAL DATA:  Recurrent syncope EXAM: MRI HEAD WITHOUT CONTRAST TECHNIQUE: Multiplanar, multiecho pulse sequences of the brain and surrounding structures were obtained without intravenous contrast. COMPARISON:  CT head 10/11/2016 FINDINGS: Brain: Generalized atrophy with prominent ventricles and subarachnoid space. Negative for acute infarct. Moderate white matter changes most consistent with chronic microvascular ischemia. Negative for hemorrhage or mass lesion. Vascular: Normal arterial flow voids. Skull and upper cervical spine: No focal skeletal lesion. Sinuses/Orbits: Paranasal sinuses clear. Cataract resection on the left. Other: None IMPRESSION: Atrophy and chronic microvascular ischemic change in the white matter. No acute abnormality. Electronically Signed   By: Franchot Gallo M.D.   On: 04/28/2020 20:18   EEG adult  Result Date: 05/01/2020 Lora Havens, MD     05/01/2020  5:00 PM Patient Name: [name] [name] MRN: [id number] Epilepsy Attending: Lora Havens Referring Physician/Provider: Dr Hosie Poisson Date: 05/01/2020 Duration: 23.05 mins Patient history: 74yo M with recurrent syncope. EEG to evaluate for seizure. Level of alertness: Awake AEDs during EEG study: None Technical aspects: This EEG study was done with scalp electrodes positioned according to the 10-20 International system of electrode placement. Electrical activity was acquired at a sampling rate of _0  and reviewed with a high frequency filter of _1  and a low frequency filter of _2 . EEG data were recorded continuously and digitally stored. Description: The posterior dominant rhythm consists of 8 Hz activity of moderate voltage (25-35 uV) seen  predominantly in posterior head regions, symmetric and reactive to eye opening and eye closing. Physiologic photic driving was seen during photic stimulation.  Hyperventilation was not performed.   IMPRESSION: This study is within normal limits. No seizures or epileptiform discharges were seen throughout the recording. Priyanka O Yadav   VAS US CAROTID  Result Date: 05/01/2020 Carotid Arterial Duplex Study Indications:      Syncope. Risk Factors:     Hypertension, hyperlipidemia, Diabetes, coronary artery                   disease. Comparison Study: no prior Performing Technologist: Abram Sander RVS  Examination Guidelines: A complete evaluation includes B-mode imaging, spectral Doppler, color Doppler, and power Doppler as needed of all accessible portions of each vessel. Bilateral testing is considered an integral part of a complete examination. Limited examinations for reoccurring indications may be performed as noted.  Right Carotid Findings: +----------+--------+--------+--------+------------------+--------+           PSV cm/sEDV cm/sStenosisPlaque DescriptionComments +----------+--------+--------+--------+------------------+--------+ CCA Prox  65      11              heterogenous               +----------+--------+--------+--------+------------------+--------+ CCA Distal53      13              heterogenous               +----------+--------+--------+--------+------------------+--------+ ICA Prox  51      20      1-39%   heterogenous               +----------+--------+--------+--------+------------------+--------+ ICA Distal51      19                                         +----------+--------+--------+--------+------------------+--------+  ECA       50                                                 +----------+--------+--------+--------+------------------+--------+ +----------+--------+-------+--------+-------------------+           PSV cm/sEDV cmsDescribeArm  Pressure (mmHG) +----------+--------+-------+--------+-------------------+ [id number]                                         +----------+--------+-------+--------+-------------------+ +---------+--------+--+--------+--+---------+ VertebralPSV cm/s59EDV cm/s26Antegrade +---------+--------+--+--------+--+---------+  Left Carotid Findings: +----------+--------+--------+--------+------------------+--------+           PSV cm/sEDV cm/sStenosisPlaque DescriptionComments +----------+--------+--------+--------+------------------+--------+ CCA Prox  71      12              heterogenous               +----------+--------+--------+--------+------------------+--------+ CCA Distal61      14              heterogenous               +----------+--------+--------+--------+------------------+--------+ ICA Prox  44      14      1-39%   heterogenous               +----------+--------+--------+--------+------------------+--------+ ICA Distal62      22                                         +----------+--------+--------+--------+------------------+--------+ ECA       50                                                 +----------+--------+--------+--------+------------------+--------+ +----------+--------+--------+--------+-------------------+           PSV cm/sEDV cm/sDescribeArm Pressure (mmHG) +----------+--------+--------+--------+-------------------+ [id number]                                          +----------+--------+--------+--------+-------------------+ +---------+--------+--+--------+--+---------+ VertebralPSV cm/s46EDV cm/s17Antegrade +---------+--------+--+--------+--+---------+   Summary: Right Carotid: Velocities in the right ICA are consistent with a 1-39% stenosis. Left Carotid: Velocities in the left ICA are consistent with a 1-39% stenosis. Vertebrals: Bilateral vertebral arteries demonstrate antegrade flow. *See table(s) above for  measurements and observations.  Electronically signed by Ruta Hinds MD on 05/01/2020 at 4:19:25 PM.    Final    {  Assessment and Plan:   # Recurrent syncope: [name] [name] had at least one witnessed episode of presyncope while in the hospital.  He is not had any arrhythmias on telemetry that would suggest any arrhythmia source.  He was orthostatic after working with physical therapy yesterday.  He has not been getting amlodipine in the hospital.  Agree with reducing his dose to 5 mg and only giving it to him on non-dialysis days if his blood pressure is over 140.  It seems as though there is also a vasovagal component to the episode that occurred while having a bowel movement.  He has been struggling with constipation and recommend  a bowel regimen to help him avoid straining.  Given that he had 2 episodes while in the hospital we will not recommend that he wear an ambulatory monitor after discharge.  EEG was negative for seizure.  Recommend compression stockings.  Given that he has ESRD and volume overload, no recommendation to increase fluid intake.  # Severe pulmonary hypertension:  [name] [name]'s RV function is mildly reduced on his most recent echo.  This is a change from 07/2019.  Pulmonary pressures are also severely elevated.  This may be due to diastolic dysfunction and volume overload.  This can be handled with hemodialysis.  However he also needs to use his CPAP.  We had a frank discussion about the need to treat obstructive sleep apnea.  He had a CT scan this admission that was negative for pulmonary embolism.     For questions or updates, please contact [facility] Please consult [email or web address] for contact info under    Signed, Skeet Latch, MD  05/02/2020 7:56 AM

## 2020-05-02 NOTE — Procedures (Signed)
I was present at this dialysis session. I have reviewed the session itself and made appropriate changes.   TDC, 1.5L UF goal. 2K bath. Pt tolerating Tx werll.  Hb 7.3, labile.    Filed Weights   04/30/20 0201 05/01/20 0449 05/02/20 0715  Weight: 85.5 kg 85.5 kg 86.4 kg    Recent Labs  Lab 05/02/20 0811  NA 128*  K 5.2*  CL 92*  CO2 25  GLUCOSE 87  BUN 30*  CREATININE 8.65*  CALCIUM 10.1  PHOS 5.6*    Recent Labs  Lab 04/27/20 1402 04/27/20 1402 04/29/20 0750 04/29/20 0750 04/30/20 0849 05/01/20 1038 05/02/20 0811  WBC 7.6  --  6.4  --   --   --  7.1  NEUTROABS 5.5  --   --   --   --   --   --   HGB 7.9*   < > 7.3*   < > 7.9* 8.3* 7.1*  HCT 25.9*   < > 24.5*   < > 26.4* 27.1* 23.6*  MCV 101.2*  --  104.7*  --   --   --  104.0*  PLT 159  --  154  --   --   --  175   < > = values in this interval not displayed.    Scheduled Meds: . acetaminophen      . amLODipine  5 mg Oral Daily  . Chlorhexidine Gluconate Cloth  6 each Topical Daily  . Chlorhexidine Gluconate Cloth  6 each Topical Q0600  . darbepoetin (ARANESP) injection - DIALYSIS  200 mcg Intravenous Q Thu-HD  . ferric citrate  420 mg Oral TID WC  . heparin  5,000 Units Subcutaneous Q8H  . melatonin  3 mg Oral QHS  . sodium chloride flush  3 mL Intravenous Q12H  . sodium chloride flush  3 mL Intravenous Q12H  . tamsulosin  0.4 mg Oral Daily   Continuous Infusions: . sodium chloride     PRN Meds:.sodium chloride, acetaminophen **OR** acetaminophen, atorvastatin, HYDROcodone-acetaminophen, senna-docusate, sodium chloride flush   [name] Grippe  MD 05/02/2020, 9:00 AM

## 2020-05-02 NOTE — Progress Notes (Signed)
PT Cancellation Note  Patient Details Name: AKAI DOLLARD MRN: [id number] DOB: 12/27/1945   Cancelled Treatment:    Reason Eval/Treat Not Completed: Patient at procedure or test/unavailable. Pt in HD.   Lorriane Shire 05/02/2020, 7:40 AM   Lorrin Goodell, PT  Office # [phone] Pager [phone]

## 2020-05-02 NOTE — Care Management Important Message (Signed)
Important Message  Patient Details  Name: SOUA CALTAGIRONE MRN: [id number] Date of Birth: 06/13/1946   Medicare Important Message Given:  Yes - Important Message mailed due to current National Emergency  Verbal consent obtained due to current National Emergency  Relationship to patient: Self Contact Name: Lillian Tigges Call Date: 05/02/20  Time: 1436 Phone: [phone] Outcome: No Answer/Busy Important Message mailed to: Patient address on file  \ Delorse Lek 05/02/2020, 2:36 PM

## 2020-05-04 ENCOUNTER — Encounter (HOSPITAL_COMMUNITY): Payer: Self-pay

## 2020-05-04 ENCOUNTER — Other Ambulatory Visit: Payer: Self-pay

## 2020-05-04 ENCOUNTER — Emergency Department (HOSPITAL_COMMUNITY)
Admission: EM | Admit: 2020-05-04 | Discharge: 2020-05-05 | Disposition: A | Discharge status: Home / Self Care | Payer: Medicare Other | Attending: Emergency Medicine | Admitting: Emergency Medicine

## 2020-05-04 ENCOUNTER — Emergency Department (HOSPITAL_COMMUNITY): Payer: Medicare Other

## 2020-05-04 DIAGNOSIS — N186 End stage renal disease: Secondary | ICD-10-CM | POA: Diagnosis not present

## 2020-05-04 DIAGNOSIS — I132 Hypertensive heart and chronic kidney disease with heart failure and with stage 5 chronic kidney disease, or end stage renal disease: Secondary | ICD-10-CM | POA: Insufficient documentation

## 2020-05-04 DIAGNOSIS — I2510 Atherosclerotic heart disease of native coronary artery without angina pectoris: Secondary | ICD-10-CM | POA: Insufficient documentation

## 2020-05-04 DIAGNOSIS — R55 Syncope and collapse: Secondary | ICD-10-CM | POA: Diagnosis not present

## 2020-05-04 DIAGNOSIS — Z992 Dependence on renal dialysis: Secondary | ICD-10-CM | POA: Diagnosis not present

## 2020-05-04 DIAGNOSIS — Z87891 Personal history of nicotine dependence: Secondary | ICD-10-CM | POA: Insufficient documentation

## 2020-05-04 DIAGNOSIS — I700 Atherosclerosis of aorta: Secondary | ICD-10-CM | POA: Insufficient documentation

## 2020-05-04 DIAGNOSIS — R0602 Shortness of breath: Secondary | ICD-10-CM | POA: Diagnosis not present

## 2020-05-04 DIAGNOSIS — I5032 Chronic diastolic (congestive) heart failure: Secondary | ICD-10-CM | POA: Diagnosis not present

## 2020-05-04 DIAGNOSIS — Z79899 Other long term (current) drug therapy: Secondary | ICD-10-CM | POA: Insufficient documentation

## 2020-05-04 LAB — COMPREHENSIVE METABOLIC PANEL
ALT: 7 U/L (ref 0–44)
AST: 25 U/L (ref 15–41)
Albumin: 2.8 g/dL — ABNORMAL LOW (ref 3.5–5.0)
Alkaline Phosphatase: 54 U/L (ref 38–126)
Anion gap: 14 (ref 5–15)
BUN: 15 mg/dL (ref 8–23)
CO2: 25 mmol/L (ref 22–32)
Calcium: 8.9 mg/dL (ref 8.9–10.3)
Chloride: 93 mmol/L — ABNORMAL LOW (ref 98–111)
Creatinine, Ser: 4.38 mg/dL — ABNORMAL HIGH (ref 0.61–1.24)
GFR calc Af Amer: 14 mL/min — ABNORMAL LOW (ref >60)
GFR calc non Af Amer: 12 mL/min — ABNORMAL LOW (ref >60)
Glucose, Bld: 111 mg/dL — ABNORMAL HIGH (ref 70–99)
Potassium: 4.1 mmol/L (ref 3.5–5.1)
Sodium: 132 mmol/L — ABNORMAL LOW (ref 135–145)
Total Bilirubin: 0.9 mg/dL (ref 0.3–1.2)
Total Protein: 5.8 g/dL — ABNORMAL LOW (ref 6.5–8.1)

## 2020-05-04 LAB — BRAIN NATRIURETIC PEPTIDE: B Natriuretic Peptide: 1477.7 pg/mL — ABNORMAL HIGH (ref 0.0–100.0)

## 2020-05-04 LAB — CBC WITH DIFFERENTIAL/PLATELET
Abs Immature Granulocytes: 0 10*3/uL (ref 0.00–0.07)
Basophils Absolute: 0 10*3/uL (ref 0.0–0.1)
Basophils Relative: 0 %
Eosinophils Absolute: 0 10*3/uL (ref 0.0–0.5)
Eosinophils Relative: 0 %
HCT: 24.6 % — ABNORMAL LOW (ref 39.0–52.0)
Hemoglobin: 7.2 g/dL — ABNORMAL LOW (ref 13.0–17.0)
Lymphocytes Relative: 13 %
Lymphs Abs: 0.9 10*3/uL (ref 0.7–4.0)
MCH: 31.3 pg (ref 26.0–34.0)
MCHC: 29.3 g/dL — ABNORMAL LOW (ref 30.0–36.0)
MCV: 107 fL — ABNORMAL HIGH (ref 80.0–100.0)
Monocytes Absolute: 0.2 10*3/uL (ref 0.1–1.0)
Monocytes Relative: 3 %
Neutro Abs: 5.9 10*3/uL (ref 1.7–7.7)
Neutrophils Relative %: 84 %
Platelets: 165 10*3/uL (ref 150–400)
RBC: 2.3 MIL/uL — ABNORMAL LOW (ref 4.22–5.81)
RDW: 22 % — ABNORMAL HIGH (ref 11.5–15.5)
WBC: 7 10*3/uL (ref 4.0–10.5)
nRBC: 0 % (ref 0.0–0.2)
nRBC: 0 /100 WBC

## 2020-05-04 LAB — I-STAT CHEM 8, ED
BUN: 14 mg/dL (ref 8–23)
Calcium, Ion: 1.08 mmol/L — ABNORMAL LOW (ref 1.15–1.40)
Chloride: 93 mmol/L — ABNORMAL LOW (ref 98–111)
Creatinine, Ser: 4.4 mg/dL — ABNORMAL HIGH (ref 0.61–1.24)
Glucose, Bld: 111 mg/dL — ABNORMAL HIGH (ref 70–99)
HCT: 23 % — ABNORMAL LOW (ref 39.0–52.0)
Hemoglobin: 7.8 g/dL — ABNORMAL LOW (ref 13.0–17.0)
Potassium: 4.1 mmol/L (ref 3.5–5.1)
Sodium: 132 mmol/L — ABNORMAL LOW (ref 135–145)
TCO2: 29 mmol/L (ref 22–32)

## 2020-05-04 LAB — TROPONIN I (HIGH SENSITIVITY)
Troponin I (High Sensitivity): 55 ng/L — ABNORMAL HIGH (ref <18)
Troponin I (High Sensitivity): 65 ng/L — ABNORMAL HIGH (ref <18)

## 2020-05-04 NOTE — ED Notes (Signed)
Pt given food and diet ginger ale

## 2020-05-04 NOTE — ED Triage Notes (Signed)
Pt from [place] via ems; c/o sob, syncopal episodes; pt states he feels this way when he needs blood transfusion; dialysis pt, received full dialysis today; syncopal episode with nurse at facility, began to have another syncopal episode with ems, able to sternal rub and wake pt; alert and oriented on arrival  99/68 CBG 123 HR 87 RR 20  98% (4L, normally wears 2)

## 2020-05-04 NOTE — ED Provider Notes (Signed)
[facility] EMERGENCY DEPARTMENT Provider Note   CSN: [id number] Arrival date & time: 05/04/20  1648    History Syncope   [name] [name] is a 74 y.o. male with past medical history significant for multiple myeloma, BPH, ascending aortic aneurysm, anemia, A. fib, CAD, ESRD Tuesday, Thursday, Saturday, full dialysis session today, recurrent syncope, CHF who presents for evaluation of possible syncope.  Patient was recently admitted and discharged 48 hours ago for similar complaints.  Patient states he feels lightheaded after his dialysis sessions.  Patient states his episode today happened after return from his dialysis facility.  He feels like he may need a blood transfusion she has had frequently in the past.  Patient denies headache, dizziness, chest pain, abdominal pain, diarrhea, dysuria, paresthesias.  He states he is on 2 L of oxygen nasal cannula at baseline.  According to EMS nursing at his facility thought he was short of breath or a syncopal episode so they increase his oxygen to 4 L via nasal cannula.  When EMS arrived they said his oxygen was 100% on the 2 L.  Patient does not have any chest pain or palpitations preceding his syncope.  He notes that he does have some lightheadedness prior to syncopal episodes.  They do not necessarily occur upon standing.  No recent head trauma  During his prior admission he was evaluated by cardiology, Dr. Oval Linsey.  Initially his syncope was thought to be due to uremia and missing dialysis.  He had an echocardiogram which showed EF 55-60 with mild LVH.  CTA was performed which was negative for PE.  He did have a syncopal episode during his admission when trying to get himself from his chair to his bed.  There were no arrhythmias on his telemetry throughout his hospitalization and during the syncopal event.  He did have some positive orthostatic vital signs during his admission.  Patient also had MRI brain which showed some atrophy and chronic  micro vascular ischemic changes.  EEG was performed which did not show any evidence of seizures or epileptic discharges throughout recording.  Had ultrasound carotids which showed some stenosis from 1 to 39%.  Cardiology recommended reducing his amlodipine and giving it to him on his nondialysis days if blood pressure systolic greater than one forty.  There is thought that there was was a vasovagal component as his episode of syncope witnessed in the hospital was during a bowel movement.  Also had some pulmonary hypertension which they recommended a CPAP machine.  They did not feel that patient needed telemetry monitoring outpatient as he had to syncopal events inpatient which did not show any arrhythmias.  History obtained from patient and past medical records.  No interpreter is used.  HPI     Past Medical History:  Diagnosis Date  . Abnormal CT of liver    a. nodular contour suggesting cirrhosis 05/2018.  [name] Abnormal LFTs (liver function tests)   . Anemia   . Anxiety   . Aortic root dilatation ([place])   . Ascending aortic aneurysm ([place])   . BPH with urinary obstruction   . Bradycardia   . 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 CAD (coronary artery disease)    a. 12/2015 NSTEMI/Cath (in setting of PAF):  LM nl, LAD 30p,  LCX 40m RCA  ok, AM 100%, RPDA1 40, RPDA 2 60 ->Med Rx.  . Childhood asthma   .  Chronic diastolic CHF (congestive heart failure) ([place])    a. 12/2015 Echo: EF 50-55%, mod AI, mod Ao root dil, mild MR, mod dil LA, mod RA.  . CKD (chronic kidney disease), stage III   . Colon cancer ([place])   . COPD (chronic obstructive pulmonary disease) ([place])    pt denies at preop  . Diabetes mellitus type II    diet controlled  . Dysrhythmia   . History of syncope   . Hyperlipidemia   . Hypertension   . Hypertensive heart disease   . Kidney lump 04/04/2010   Overview:  Renal Cell Carcinoma   . 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 Moderate aortic insufficiency    a. 12/2015 Echo: Mod AI.  [name] Paroxysmal atrial fibrillation ([place])    a. 12/2015 started on Xarelto (CHA2DS2VASc = 4-5).  . Peripheral vascular disease ([place])   . Pneumonia   . Prostate cancer ([place])   . Renal cell carcinoma ([place])   . Sleep apnea    Does not like  CPAP  . Spinal stenosis in cervical region 10/12/2016  . Venous insufficiency     Patient Active Problem List   Diagnosis Date Noted  . Generalized weakness 04/26/2020  . Hyperkalemia 04/26/2020  . Hyponatremia 04/26/2020  . Prolonged QT interval 04/26/2020  . Aortic root enlargement ([place]) 02/24/2020  . Symptomatic anemia 02/04/2020  . ESRD (end stage renal disease) ([place]) 01/27/2020  . Goals of care, counseling/discussion   . Palliative care by specialist   . DNR (do not resuscitate) discussion   . ARF (acute renal failure) ([place]) 01/22/2020  . Ascending aortic aneurysm ([place]) 08/04/2019  . Myeloma kidney ([place])   . CKD (chronic kidney disease), stage III 06/04/2019  . Renal cell carcinoma ([place]) 06/04/2019  . Gross hematuria 06/04/2019  . COPD exacerbation ([place]) 11/05/2018  . Palpitations 10/22/2018  . Counseling regarding advance care planning and goals of care 06/22/2018  . AKI (acute kidney injury) ([place])   . Anemia   . Sepsis ([place]) 06/05/2018  . Acute lower UTI 06/05/2018  . Colon polyp 05/21/2018  . Aortic valve regurgitation 04/14/2018  . Medication management 04/14/2018  . Thoracic aortic aneurysm without rupture ([place]) 09/29/2017  . 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 Head trauma, subsequent encounter 10/12/2016  . 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 Spinal stenosis in cervical region 10/12/2016  . Cervical spondylosis 10/12/2016  . Syncope and collapse 10/12/2016  . Syncope 10/11/2016  . CAD (coronary artery disease)   . Chronic diastolic CHF (congestive  heart failure) ([place])   . Hyperlipidemia   . Hypertensive heart disease   . Paroxysmal atrial fibrillation ([place]) 12/17/2015  . 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 Mild intermittent asthma 10/18/2015  . Aortic root dilatation ([place]) 10/17/2015  . Diabetes ([place]) 10/17/2015  . 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 Hand paresthesia 12/09/2014  . Elevated prostate specific antigen (PSA) 07/12/2014  . Allergic rhinitis 07/12/2014  . LBP (low back pain) 07/12/2014  . Palpitation 07/11/2014  . Patient's other noncompliance with medication regimen 08/10/2013  . Chronic venous insufficiency 07/23/2012  . Obstructive apnea 04/11/2011  . Essential (primary) hypertension 09/07/2010  . 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 Anxiety, generalized 04/04/2010  . Cardiac conduction disorder 02/27/2010  . BPH (benign prostatic hyperplasia) 09/13/2009  . Abnormal findings on examination of genitourinary organs 08/09/2009  .  Hereditary and idiopathic neuropathy 07/05/2009    Past Surgical History:  Procedure Laterality Date  . AV FISTULA PLACEMENT Left 01/26/2020   Procedure: left ARTERIOVENOUS (AV) FISTULA CREATION;  Surgeon: Waynetta Sandy, MD;  Location: [facility];  Service: Vascular;  Laterality: Left;  . BACK SURGERY    . BASCILIC VEIN TRANSPOSITION Left 04/28/2020   Procedure: LEFT ARM SECOND STAGE BASILIC VEIN TRANSPOSITION;  Surgeon: Waynetta Sandy, MD;  Location: [facility];  Service: Vascular;  Laterality: Left;  . CARDIAC CATHETERIZATION N/A 12/18/2015   Procedure: Left Heart Cath and Coronary Angiography;  Surgeon: Leonie Man, MD;  Location: [facility];  Service: Cardiovascular;  Laterality: N/A;  . DIAGNOSTIC LAPAROSCOPY     partial colectomy  . IR FLUORO GUIDE CV LINE RIGHT  06/28/2019  . IR FLUORO GUIDE CV LINE RIGHT  01/24/2020  . IR REMOVAL TUN CV CATH W/O FL  07/26/2019  . IR US GUIDE VASC ACCESS RIGHT   06/28/2019  . IR US GUIDE VASC ACCESS RIGHT  01/24/2020  . LAPAROSCOPIC PARTIAL COLECTOMY Right 05/21/2018   Procedure: LAPAROSCOPIC RIGHT  COLECTOMY ERAS PATHWAY;  Surgeon: Leighton Ruff, MD;  Location: [facility];  Service: General;  Laterality: Right;  . [place]   "replaced a disc"       Family History  Problem Relation Age of Onset  . Emphysema Father   . Asthma Father   . Liver disease Father        tumor  . Heart disease Mother   . Hypertension Mother   . Asthma Sister   . Hypertension Son     Social History   Tobacco Use  . Smoking status: Former Smoker    Types: Cigars    Quit date: 09/02/1977    Years since quitting: 42.7  . Smokeless tobacco: Never Used  . Tobacco comment:    Vaping Use  . Vaping Use: Never used  Substance Use Topics  . Alcohol use: Not Currently    Comment: "stopped drinking alcohol in ~ 1980; just drank a little on the weekends when I did drink"  . Drug use: No    Home Medications Prior to Admission medications   Medication Sig Start Date End Date Taking? Authorizing Provider  amLODipine (NORVASC) 5 MG tablet Take 1 tablet (5 mg total) by mouth daily. 05/02/20   Hosie Poisson, MD  atorvastatin (LIPITOR) 40 MG tablet Take 40 mg by mouth daily as needed (pt states he only takes when his cholesterol is high).     [provider]  AURYXIA 1 GM 210 MG(Fe) tablet Take 420 mg by mouth 3 (three) times daily. 03/23/20   [provider]  cetirizine (ZYRTEC) 10 MG tablet TAKE 1 TABLET (10 MG TOTAL) BY MOUTH DAILY AS NEEDED (SEASONAL ALLERGIES). Patient taking differently: Take 10 mg by mouth daily as needed for allergies (seasonal allergies).  11/12/19   [name], DO  Darbepoetin Alfa (ARANESP) 200 MCG/0.4ML SOSY injection Inject 0.4 mLs (200 mcg total) into the vein every Thursday with hemodialysis. 05/04/20   Hosie Poisson, MD  glucose blood (ONETOUCH ULTRA) test strip Check FSBS twice a day. Dx: E11.21,  N18.32 Patient taking differently: 1 each by Other route See admin instructions. Check FSBS twice a day. Dx: E11.21, N18.32 08/16/19   Fulp, Ander Gaster, MD  HYDROcodone-acetaminophen (NORCO/VICODIN) 5-325 MG tablet Take 1-2 tablets by mouth every 6 (six) hours as needed for up to 3 days (gout pain). 05/02/20  05/05/20  Hosie Poisson, MD  ixazomib citrate (NINLARO) 3 MG capsule Take 1 capsule (3 mg) by mouth weekly, 3 weeks on, 1 week off, repeat every 4 weeks. Take on an empty stomach 1hr before or 2hr after meals. Patient taking differently: Take 3 mg by mouth See admin instructions. Take 1 capsule (3 mg) by mouth weekly, 3 weeks on, 1 week off, repeat every 4 weeks. Take on an empty stomach 1hr before or 2hr after meals. 03/10/20   Brunetta Genera, MD  melatonin 3 MG TABS tablet Take 1 tablet (3 mg total) by mouth at bedtime. 05/02/20   Hosie Poisson, MD  senna-docusate (SENOKOT-S) 8.6-50 MG tablet Take 1 tablet by mouth at bedtime as needed for mild constipation. 05/02/20   Hosie Poisson, MD  tamsulosin (FLOMAX) 0.4 MG CAPS capsule Take 1 capsule (0.4 mg total) by mouth daily. 10/15/19   [name], DO    Allergies    Amoxicillin and Penicillins  Review of Systems   Review of Systems  Constitutional: Negative.   HENT: Negative.   Respiratory: Negative.   Cardiovascular: Negative.   Gastrointestinal: Negative.   Genitourinary: Negative.   Musculoskeletal: Negative.   Skin: Negative.   Neurological: Positive for syncope and light-headedness. Negative for dizziness, tremors, seizures, facial asymmetry, speech difficulty, weakness, numbness and headaches.  All other systems reviewed and are negative.   Physical Exam Updated Vital Signs BP 118/78   Pulse 88   Resp 15   Ht 5' 9"  (1.753 m)   Wt 86 kg   SpO2 92%   BMI 28.00 kg/m   Physical Exam Vitals and nursing note reviewed.  Constitutional:      General: He is not in acute distress.    Appearance: He is well-developed.  He is ill-appearing (Chronically ill appearing). He is not toxic-appearing or diaphoretic.  HENT:     Head: Normocephalic and atraumatic.     Nose: Nose normal.     Mouth/Throat:     Mouth: Mucous membranes are dry.  Eyes:     Pupils: Pupils are equal, round, and reactive to light.     Comments: No nystagmus  Neck:     Comments: No JVD Cardiovascular:     Rate and Rhythm: Normal rate and regular rhythm.     Pulses: Normal pulses.     Heart sounds: Normal heart sounds.     Comments: No murmur Pulmonary:     Effort: Pulmonary effort is normal. No respiratory distress.     Breath sounds: Normal breath sounds.     Comments: Clear to auscultation bilateral without wheeze, rhonchi or rales.  Speaks in full sentences without difficulty Chest:     Comments: Dialysis catheter to right upper chest wall.  No surrounding erythema, warmth, fluctuance, induration, bleeding or drainage. Abdominal:     General: Bowel sounds are normal. There is no distension.     Palpations: Abdomen is soft.     Tenderness: There is no abdominal tenderness. There is no guarding or rebound.     Hernia: A hernia is present.     Comments: Soft, nontender.  Large ventral wall hernia which is able to be reproduced. No rebound or gaurding  Musculoskeletal:        General: Normal range of motion.     Cervical back: Normal range of motion and neck supple.     Comments: Moves all four extremities without difficulty.  Compartments soft, trace pitting edema to bilateral shins to mid shin.  No rashes or lesions.  Skin:    General: Skin is warm and dry.     Capillary Refill: Capillary refill takes less than 2 seconds.  Neurological:     General: No focal deficit present.     Mental Status: He is alert and oriented to person, place, and time.     Comments: Mental Status:  Alert, oriented, thought content appropriate. Speech fluent without evidence of aphasia. Able to follow 2 step commands without difficulty.  Cranial  Nerves:  II:  Peripheral visual fields grossly normal, pupils equal, round, reactive to light III,IV, VI: ptosis not present, extra-ocular motions intact bilaterally  V,VII: smile symmetric, facial light touch sensation equal VIII: hearing grossly normal bilaterally  IX,X: midline uvula rise  XI: bilateral shoulder shrug equal and strong XII: midline tongue extension  Motor:  5/5 in upper and lower extremities bilaterally including strong and equal grip strength and dorsiflexion/plantar flexion Sensory: Pinprick and light touch normal in all extremities.  Deep Tendon Reflexes: 2+ and symmetric  Cerebellar: normal finger-to-nose with bilateral upper extremities Gait: normal gait and balance CV: distal pulses palpable throughout      ED Results / Procedures / Treatments   Labs (all labs ordered are listed, but only abnormal results are displayed) Labs Reviewed  CBC WITH DIFFERENTIAL/PLATELET - Abnormal; Notable for the following components:      Result Value   RBC 2.30 (*)    Hemoglobin 7.2 (*)    HCT 24.6 (*)    MCV 107.0 (*)    MCHC 29.3 (*)    RDW 22.0 (*)    All other components within normal limits  COMPREHENSIVE METABOLIC PANEL - Abnormal; Notable for the following components:   Sodium 132 (*)    Chloride 93 (*)    Glucose, Bld 111 (*)    Creatinine, Ser 4.38 (*)    Total Protein 5.8 (*)    Albumin 2.8 (*)    GFR calc non Af Amer 12 (*)    GFR calc Af Amer 14 (*)    All other components within normal limits  BRAIN NATRIURETIC PEPTIDE - Abnormal; Notable for the following components:   B Natriuretic Peptide 1,477.7 (*)    All other components within normal limits  I-STAT CHEM 8, ED - Abnormal; Notable for the following components:   Sodium 132 (*)    Chloride 93 (*)    Creatinine, Ser 4.40 (*)    Glucose, Bld 111 (*)    Calcium, Ion 1.08 (*)    Hemoglobin 7.8 (*)    HCT 23.0 (*)    All other components within normal limits  TROPONIN I (HIGH SENSITIVITY) -  Abnormal; Notable for the following components:   Troponin I (High Sensitivity) 55 (*)    All other components within normal limits  TROPONIN I (HIGH SENSITIVITY) - Abnormal; Notable for the following components:   Troponin I (High Sensitivity) 65 (*)    All other components within normal limits    EKG EKG Interpretation  Date/Time:  Thursday May 04 2020 17:13:26 EDT Ventricular Rate:  81 PR Interval:    QRS Duration: 106 QT Interval:  501 QTC Calculation: 582 R Axis:   -83 Text Interpretation: Sinus arrhythmia Left anterior fascicular block Abnormal R-wave progression, late transition Borderline T abnormalities, anterior leads No significant change since last tracing Confirmed by Wandra Arthurs ([id number]) on 05/04/2020 5:18:51 PM   Radiology DG Chest Port 1 View  Result Date: 05/04/2020 CLINICAL DATA:  Shortness of  breath. EXAM: PORTABLE CHEST 1 VIEW COMPARISON:  April 25, 2020 FINDINGS: There is stable right-sided venous catheter positioning. Mild, diffuse chronic appearing increased lung markings are seen. There is no evidence of acute infiltrate, pleural effusion or pneumothorax. The cardiac silhouette is markedly enlarged. The visualized skeletal structures are unremarkable. IMPRESSION: Stable cardiomegaly without evidence of acute or active cardiopulmonary disease. Electronically Signed   By: Virgina Norfolk M.D.   On: 05/04/2020 18:15    CT ANGIO CHEST PE W OR WO CONTRAST  Result Date: 04/30/2020 CLINICAL DATA:  Shortness of breath, question pulmonary embolism, negative D-dimer EXAM: CT ANGIOGRAPHY CHEST WITH CONTRAST TECHNIQUE: Multidetector CT imaging of the chest was performed using the standard protocol during bolus administration of intravenous contrast. Multiplanar CT image reconstructions and MIPs were obtained to evaluate the vascular anatomy. CONTRAST:  39m OMNIPAQUE IOHEXOL 350 MG/ML SOLN Sagittal and coronal MPR images reconstructed from axial data set. COMPARISON:   05/19/2018 FINDINGS: Cardiovascular: Atherosclerotic calcifications aorta, coronary arteries, proximal great vessels. Aneurysmal dilatation ascending thoracic aorta 4.8 cm diameter. Aortic root 5.1 cm diameter. No evidence of aortic dissection. Dilated central pulmonary arteries, main pulmonary artery 4.9 cm diameter question pulmonary arterial hypertension. Pulmonary arteries well opacified. Pulmonary arterial assessment in the lower lobes and in a portion of the upper lobes is limited by respiratory motion. No definite pulmonary emboli identified. Enlargement of cardiac chambers especially RIGHT ventricle and RIGHT atrium. Small pericardial effusion. Mediastinum/Nodes: Esophagus unremarkable. No thoracic adenopathy. Base of cervical region normal appearance. Lungs/Pleura: Minimal bibasilar effusions and atelectasis greater on RIGHT. Remaining lungs clear. Upper Abdomen: Large solid soft tissue mass identified in LEFT upper quadrant, by prior PET-CT arising from LEFT kidney consistent with neoplasm, visualized portion measuring 9.7 x 8.0 cm. Two small hyperdense nodules are seen within RIGHT kidney, larger 2.2 cm greatest size. Ventral hernia in RIGHT upper quadrant incompletely visualized. Probable additional cysts in both kidneys. Few nonspecific low-attenuation foci at the superior aspect of the liver, nonspecific, largest 10 mm. Musculoskeletal: Soft tissue gas identified at LEFT axilla likely related to recent vascular surgery LEFT arm. No acute osseous findings. Review of the MIP images confirms the above findings. IMPRESSION: No definite evidence of pulmonary embolism, with limited by respiratory motion. Aneurysmal dilatation of the thoracic aorta including ascending thoracic aorta 4.8 cm diameter, aortic root 5.1 cm diameter; recommend semi-annual imaging followup by CTA or MRA and referral to cardiothoracic surgery if not already obtained. This recommendation follows 2010  ACCF/AHA/AATS/ACR/ASA/SCA/SCAI/SIR/STS/SVM Guidelines for the Diagnosis and Management of Patients With Thoracic Aortic Disease. Circulation. 2010; 121:: [id number] Aortic aneurysm NOS (ICD10-I71.9) Small pericardial effusion. Dilated central pulmonary arteries consistent with pulmonary arterial hypertension, which also likely accounts for dilated RIGHT ventricle and RIGHT atrium. Minimal bibasilar effusions and atelectasis greater on RIGHT. Large LEFT upper quadrant mass 9.7 x 8.0 cm consistent with neoplasm arising from LEFT kidney, see assessment by prior PET-CT. Ventral hernia in RIGHT upper quadrant incompletely visualized. Aortic Atherosclerosis (ICD10-I70.0). Electronically Signed   By: MLavonia DanaM.D.   On: 04/30/2020 16:30   MR BRAIN WO CONTRAST  Result Date: 04/28/2020 CLINICAL DATA:  Recurrent syncope EXAM: MRI HEAD WITHOUT CONTRAST TECHNIQUE: Multiplanar, multiecho pulse sequences of the brain and surrounding structures were obtained without intravenous contrast. COMPARISON:  CT head 10/11/2016 FINDINGS: Brain: Generalized atrophy with prominent ventricles and subarachnoid space. Negative for acute infarct. Moderate white matter changes most consistent with chronic microvascular ischemia. Negative for hemorrhage or mass lesion. Vascular: Normal arterial flow voids. Skull and upper  cervical spine: No focal skeletal lesion. Sinuses/Orbits: Paranasal sinuses clear. Cataract resection on the left. Other: None IMPRESSION: Atrophy and chronic microvascular ischemic change in the white matter. No acute abnormality. Electronically Signed   By: Franchot Gallo M.D.   On: 04/28/2020 20:18   EEG adult  Result Date: 05/01/2020 Lora Havens, MD     05/01/2020  5:00 PM Patient Name: MONTERIUS ROLF MRN: [id number] Epilepsy Attending: Lora Havens Referring Physician/Provider: Dr Hosie Poisson Date: 05/01/2020 Duration: 23.05 mins Patient history: 74yo M with recurrent syncope. EEG to evaluate for seizure.  Level of alertness: Awake AEDs during EEG study: None Technical aspects: This EEG study was done with scalp electrodes positioned according to the 10-20 International system of electrode placement. Electrical activity was acquired at a sampling rate of 500Hz  and reviewed with a high frequency filter of 70Hz  and a low frequency filter of 1Hz . EEG data were recorded continuously and digitally stored. Description: The posterior dominant rhythm consists of 8 Hz activity of moderate voltage (25-35 uV) seen predominantly in posterior head regions, symmetric and reactive to eye opening and eye closing. Physiologic photic driving was seen during photic stimulation.  Hyperventilation was not performed.   IMPRESSION: This study is within normal limits. No seizures or epileptiform discharges were seen throughout the recording. Priyanka O Yadav   VAS US CAROTID  Result Date: 05/01/2020 Carotid Arterial Duplex Study Indications:      Syncope. Risk Factors:     Hypertension, hyperlipidemia, Diabetes, coronary artery                   disease. Comparison Study: no prior Performing Technologist: Abram Sander RVS  Examination Guidelines: A complete evaluation includes B-mode imaging, spectral Doppler, color Doppler, and power Doppler as needed of all accessible portions of each vessel. Bilateral testing is considered an integral part of a complete examination. Limited examinations for reoccurring indications may be performed as noted.  Right Carotid Findings: +----------+--------+--------+--------+------------------+--------+           PSV cm/sEDV cm/sStenosisPlaque DescriptionComments +----------+--------+--------+--------+------------------+--------+ CCA Prox  65      11              heterogenous               +----------+--------+--------+--------+------------------+--------+ CCA Distal53      13              heterogenous               +----------+--------+--------+--------+------------------+--------+  ICA Prox  51      20      1-39%   heterogenous               +----------+--------+--------+--------+------------------+--------+ ICA Distal51      19                                         +----------+--------+--------+--------+------------------+--------+ ECA       50                                                 +----------+--------+--------+--------+------------------+--------+ +----------+--------+-------+--------+-------------------+           PSV cm/sEDV cmsDescribeArm Pressure (mmHG) +----------+--------+-------+--------+-------------------+ [id number]                                         +----------+--------+-------+--------+-------------------+ +---------+--------+--+--------+--+---------+  VertebralPSV cm/s59EDV cm/s26Antegrade +---------+--------+--+--------+--+---------+  Left Carotid Findings: +----------+--------+--------+--------+------------------+--------+           PSV cm/sEDV cm/sStenosisPlaque DescriptionComments +----------+--------+--------+--------+------------------+--------+ CCA Prox  71      12              heterogenous               +----------+--------+--------+--------+------------------+--------+ CCA Distal61      14              heterogenous               +----------+--------+--------+--------+------------------+--------+ ICA Prox  44      14      1-39%   heterogenous               +----------+--------+--------+--------+------------------+--------+ ICA Distal62      22                                         +----------+--------+--------+--------+------------------+--------+ ECA       52                                                 +----------+--------+--------+--------+------------------+--------+ +----------+--------+--------+--------+-------------------+           PSV cm/sEDV cm/sDescribeArm Pressure (mmHG) +----------+--------+--------+--------+-------------------+ [id number]                                           +----------+--------+--------+--------+-------------------+ +---------+--------+--+--------+--+---------+ VertebralPSV cm/s46EDV cm/s17Antegrade +---------+--------+--+--------+--+---------+   Summary: Right Carotid: Velocities in the right ICA are consistent with a 1-39% stenosis. Left Carotid: Velocities in the left ICA are consistent with a 1-39% stenosis. Vertebrals: Bilateral vertebral arteries demonstrate antegrade flow. *See table(s) above for measurements and observations.  Electronically signed by Ruta Hinds MD on 05/01/2020 at 4:19:25 PM.    Final    Procedures Procedures (including critical care time)  Medications Ordered in ED Medications - No data to display  ED Course  I have reviewed the triage vital signs and the nursing notes.  Pertinent labs & imaging results that were available during my care of the patient were reviewed by me and considered in my medical decision making (see chart for details).  74 year old presents for evaluation of possible syncope episode at SNF. Was recently discharged 2 days ago for recurrent syncope.  Has completed his dialysis when he returned.  Felt lightheaded.  No preceding sudden onset thunderclap headache, chest pain.  Does feel short of breath during dialysis however none currently.  He is on 2 L via nasal cannula on arrival.  Per EMS and patient this is his baseline.  No weakness, paresthesias, facial droop.  Plan on labs, imaging and reassess.  Labs imaging personally reviewed and interpreted: CBC without leukocytosis, hemoglobin 7.2 similar to prior Metabolic panel creatinine 4.38, improved from previous, sodium 132, hyperglycemia to 116 Troponin 55>>65... Similar to prior troponins Plain film chest with cardiomegaly without pulmonary edema, pneumothorax, infiltrates EKG without STEMI  Patient reassessed.  He is actually off of his home oxygen with oxygen saturations 96% without any tachypnea  or hypoxia he returned from Xray when I went to reevaluate. Patient without hypoxia on RA however states  he "feels better with 2 L [place]" will place back on his home O2.  Discussed with patient his syncopal episode.  Patient states this feels similar to his prior episodes in the past.  Patient recently discharged 48 hours ago for similar complaint and had very extensive work-up including MRI, CTA chest, EEG, ultrasound, echo, telemetry. He was consulted on by cardiology who thought this was likely due to vasovagal syncope. This seems consistent with the story today. Patient was assessed by attending, Dr. Darl Householder. Labs reassuring here in the emergency department. He is not hypotensive, tachycardic and tachypneic. He is able to ambulate without any dyspnea. He has a nonfocal neuro exam without deficits. Plan on following up outpatient.  The patient has been appropriately medically screened and/or stabilized in the ED. I have low suspicion for any other emergent medical condition which would require further screening, evaluation or treatment in the ED or require inpatient management.  Patient is hemodynamically stable and in no acute distress.  Patient able to ambulate in department prior to ED.  Evaluation does not show acute pathology that would require ongoing or additional emergent interventions while in the emergency department or further inpatient treatment.  I have discussed the diagnosis with the patient and answered all questions.  Pain is been managed while in the emergency department and patient has no further complaints prior to discharge.  Patient is comfortable with plan discussed in room and is stable for discharge at this time.  I have discussed strict return precautions for returning to the emergency department.  Patient was encouraged to follow-up with PCP/specialist refer to at discharge.  Patient seen and evaluated by attending Dr. Darl Householder who agrees with above treatment, plan and disposition.      MDM Rules/Calculators/A&P                           Final Clinical Impression(s) / ED Diagnoses Final diagnoses:  Recurrent syncope  Dialysis patient [facility])    Rx / DC Orders ED Discharge Orders    None       Azam Gervasi A, PA-C 05/05/20 0000    Drenda Freeze, MD 05/10/20 1526

## 2020-05-04 NOTE — Discharge Instructions (Signed)
Follow-up with cardiology and primary care doctor  Return for new or worsening symptoms

## 2020-05-05 ENCOUNTER — Other Ambulatory Visit: Payer: Self-pay | Admitting: Hematology

## 2020-05-05 NOTE — Telephone Encounter (Signed)
Please review for refill.  

## 2020-05-06 ENCOUNTER — Other Ambulatory Visit: Payer: Self-pay

## 2020-05-06 ENCOUNTER — Emergency Department (HOSPITAL_COMMUNITY)
Admission: EM | Admit: 2020-05-06 | Discharge: 2020-05-06 | Disposition: A | Discharge status: Home / Self Care | Payer: Medicare Other | Source: Home / Self Care | Attending: Emergency Medicine | Admitting: Emergency Medicine

## 2020-05-06 ENCOUNTER — Encounter (HOSPITAL_COMMUNITY): Payer: Self-pay

## 2020-05-06 DIAGNOSIS — I2510 Atherosclerotic heart disease of native coronary artery without angina pectoris: Secondary | ICD-10-CM | POA: Insufficient documentation

## 2020-05-06 DIAGNOSIS — I5032 Chronic diastolic (congestive) heart failure: Secondary | ICD-10-CM | POA: Insufficient documentation

## 2020-05-06 DIAGNOSIS — E1122 Type 2 diabetes mellitus with diabetic chronic kidney disease: Secondary | ICD-10-CM | POA: Insufficient documentation

## 2020-05-06 DIAGNOSIS — R55 Syncope and collapse: Secondary | ICD-10-CM

## 2020-05-06 DIAGNOSIS — I132 Hypertensive heart and chronic kidney disease with heart failure and with stage 5 chronic kidney disease, or end stage renal disease: Secondary | ICD-10-CM | POA: Insufficient documentation

## 2020-05-06 DIAGNOSIS — Z87891 Personal history of nicotine dependence: Secondary | ICD-10-CM | POA: Insufficient documentation

## 2020-05-06 DIAGNOSIS — J441 Chronic obstructive pulmonary disease with (acute) exacerbation: Secondary | ICD-10-CM | POA: Insufficient documentation

## 2020-05-06 DIAGNOSIS — Z992 Dependence on renal dialysis: Secondary | ICD-10-CM

## 2020-05-06 DIAGNOSIS — C649 Malignant neoplasm of unspecified kidney, except renal pelvis: Secondary | ICD-10-CM | POA: Diagnosis not present

## 2020-05-06 DIAGNOSIS — R079 Chest pain, unspecified: Secondary | ICD-10-CM | POA: Diagnosis not present

## 2020-05-06 DIAGNOSIS — N186 End stage renal disease: Secondary | ICD-10-CM | POA: Insufficient documentation

## 2020-05-06 DIAGNOSIS — Z79899 Other long term (current) drug therapy: Secondary | ICD-10-CM | POA: Insufficient documentation

## 2020-05-06 LAB — CBC WITH DIFFERENTIAL/PLATELET
Abs Immature Granulocytes: 0.05 10*3/uL (ref 0.00–0.07)
Basophils Absolute: 0 10*3/uL (ref 0.0–0.1)
Basophils Relative: 0 %
Eosinophils Absolute: 0 10*3/uL (ref 0.0–0.5)
Eosinophils Relative: 0 %
HCT: 25.1 % — ABNORMAL LOW (ref 39.0–52.0)
Hemoglobin: 7.3 g/dL — ABNORMAL LOW (ref 13.0–17.0)
Immature Granulocytes: 1 %
Lymphocytes Relative: 11 %
Lymphs Abs: 0.9 10*3/uL (ref 0.7–4.0)
MCH: 31.7 pg (ref 26.0–34.0)
MCHC: 29.1 g/dL — ABNORMAL LOW (ref 30.0–36.0)
MCV: 109.1 fL — ABNORMAL HIGH (ref 80.0–100.0)
Monocytes Absolute: 0.8 10*3/uL (ref 0.1–1.0)
Monocytes Relative: 9 %
Neutro Abs: 6.4 10*3/uL (ref 1.7–7.7)
Neutrophils Relative %: 79 %
Platelets: 173 10*3/uL (ref 150–400)
RBC: 2.3 MIL/uL — ABNORMAL LOW (ref 4.22–5.81)
RDW: 22.4 % — ABNORMAL HIGH (ref 11.5–15.5)
WBC: 8.1 10*3/uL (ref 4.0–10.5)
nRBC: 0.4 % — ABNORMAL HIGH (ref 0.0–0.2)

## 2020-05-06 LAB — COMPREHENSIVE METABOLIC PANEL
ALT: 8 U/L (ref 0–44)
AST: 26 U/L (ref 15–41)
Albumin: 2.8 g/dL — ABNORMAL LOW (ref 3.5–5.0)
Alkaline Phosphatase: 56 U/L (ref 38–126)
Anion gap: 16 — ABNORMAL HIGH (ref 5–15)
BUN: 25 mg/dL — ABNORMAL HIGH (ref 8–23)
CO2: 24 mmol/L (ref 22–32)
Calcium: 9.8 mg/dL (ref 8.9–10.3)
Chloride: 93 mmol/L — ABNORMAL LOW (ref 98–111)
Creatinine, Ser: 6.66 mg/dL — ABNORMAL HIGH (ref 0.61–1.24)
GFR calc Af Amer: 9 mL/min — ABNORMAL LOW (ref >60)
GFR calc non Af Amer: 7 mL/min — ABNORMAL LOW (ref >60)
Glucose, Bld: 108 mg/dL — ABNORMAL HIGH (ref 70–99)
Potassium: 4.6 mmol/L (ref 3.5–5.1)
Sodium: 133 mmol/L — ABNORMAL LOW (ref 135–145)
Total Bilirubin: 0.8 mg/dL (ref 0.3–1.2)
Total Protein: 5.9 g/dL — ABNORMAL LOW (ref 6.5–8.1)

## 2020-05-06 LAB — TROPONIN I (HIGH SENSITIVITY): Troponin I (High Sensitivity): 60 ng/L — ABNORMAL HIGH (ref <18)

## 2020-05-06 LAB — CBG MONITORING, ED: Glucose-Capillary: 99 mg/dL (ref 70–99)

## 2020-05-06 MED ORDER — LORAZEPAM 2 MG/ML IJ SOLN
0.5000 mg | Freq: Once | INTRAMUSCULAR | Status: AC
  Administered 2020-05-06: 0.5 mg via INTRAVENOUS
  Filled 2020-05-06: qty 1

## 2020-05-06 NOTE — Discharge Instructions (Signed)
Thank you for allowing me to care for you today in the Emergency Department.   Go to dialysis.  Your blood work was reassuring.  You should not check your blood pressure more than once daily.  Please put new batteries in your blood pressure cuff.  Return to the emergency department if you pass out, if you develop respiratory distress, or other new, concerning symptoms.

## 2020-05-06 NOTE — ED Notes (Signed)
Paged [facility] to transport to dialysis on [address]

## 2020-05-06 NOTE — Progress Notes (Addendum)
   Palliative Medicine Inpatient Follow Up Note  Consult Reason: "Patient is currently under palliative care management.  He was recently placed at [facility].  Staff reports that patient has been calling EMS multiple times per day.  He is checking his blood pressure constantly.  This is a second visit to the ER this week.  Wanted to close the communication loop with palliative care since he has had increased ER and EMS usage over the last few weeks"  Provider Note: PMT was consulted this morning to see Mr. Garis. Unfortunately, he was discharged prior to [place] being able to evaluate him. I was able to touch base with the [facility] team who will follow up upon discharge.    Mr. Aldrete was at one point enrolled in [facility] hospice services though revoked thereafter  No Charge  ______________________________________________________________________________________ [place] Team Team Cell Phone: [phone] Please utilize secure chat with additional questions, if there is no response within 30 minutes please call the above phone number  Palliative Medicine Team providers are available by phone from 7am to 7pm daily and can be reached through the team cell phone.  Should this patient require assistance outside of these hours, please call the patient's attending physician.

## 2020-05-06 NOTE — ED Notes (Signed)
Brought pt snack and drink 

## 2020-05-06 NOTE — ED Provider Notes (Signed)
[facility] EMERGENCY DEPARTMENT Provider Note   CSN: [id number] Arrival date & time: 05/06/20  0254     History Chief Complaint  Patient presents with  . Shortness of Breath    pt c/o sob x 1 week, denies cough, fevers and denies exposure to covid. Sent from SNF d/t weakness & sob. C/o cp, but states pain has resolved- dialysis TThSa    [name] [name] is a 74 y.o. male with complex chronic medical conditions as listed below who presents to the emergency department by EMS from [place] with a chief complaint of chest pain.  Chest pain began approximately 1 hour prior to arrival.  The patient is also feeling as if he is having recurrent syncopal episodes.  EMS noted blood pressure was a waxing and waning in route.  Fluctuating between 27-03 systolic and 500.  He was given 350 mls of fluids in route.  In the ER, patient reports that chest pain and shortness of breath have now resolved. He has multiple concerns: Wants to be worked up for his hemoglobin and suspects he will need a blood transfusion, he is requesting IV fluids for his blood pressure, he would like something to eat and drink. States that he needs an extensive work-up because he does not feel like everything that he needs to get better is being done. He feels that if all of his medical problems were addressed that he would be able to go home and live with his son again.  Spoke with Alma Friendly, LPN at [facility]. She reports that the patient is new to the facility. Earlier tonight, the patient was repeatedly checking his blood pressure with a digital cuff in his room. He called out for staff to check his blood pressure since it was low when he checked it with his digital cuff. When she checked the cuff, she found that the batteries were dying and the reading appeared to be low due to low battery. Using another blood pressure cuff, his blood pressure was checked multiple times and found to be 120/72 and 119/72. He was  advised by staff to stop using the digital blood pressure cuff in his room. She reports that he initially called out personally to EMS for low blood pressure. EMS checked his blood pressure on arrival and provided reassurance that it was not too low. He was agreeable to staying at the facility. She reports that shortly thereafter he began checking his blood pressure again and called EMS himself, but this time was requesting to be transported to the hospital for further work-up and evaluation.  [facility] staff also notes that the patient has called EMS many times over the last few days since coming to their facility.  No recent fever, chills, cough, vomiting, diarrhea, seizure-like activity, rash, or URI symptoms. He has dialyzed at the [facility] facility on Tuesday, Thursday, and Saturday. He makes minimal urine. He is followed by the palliative care and hospice team. He is a full code.  The history is provided by the patient, the EMS personnel and medical records.       Past Medical History:  Diagnosis Date  . Abnormal CT of liver    a. nodular contour suggesting cirrhosis 05/2018.  [name] Abnormal LFTs (liver function tests)   . Anemia   . Anxiety   . Aortic root dilatation ([place])   . Ascending aortic aneurysm ([place])   . BPH with urinary obstruction   . Bradycardia   . 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 CAD (coronary artery disease)    a. 12/2015 NSTEMI/Cath (in setting of PAF):  LM nl, LAD 30p,  LCX 41m, RCA  ok, AM 100%, RPDA1 40, RPDA 2 60 ->Med Rx.  . Childhood asthma   . Chronic diastolic CHF (congestive heart failure) ([place])    a. 12/2015 Echo: EF 50-55%, mod AI, mod Ao root dil, mild MR, mod dil LA, mod RA.  . CKD (chronic kidney disease), stage III   . Colon cancer ([place])   . COPD (chronic obstructive pulmonary disease) ([place])    pt denies at preop  . Diabetes mellitus type II    diet controlled   . Dysrhythmia   . History of syncope   . Hyperlipidemia   . Hypertension   . Hypertensive heart disease   . Kidney lump 04/04/2010   Overview:  Renal Cell Carcinoma   . 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 Moderate aortic insufficiency    a. 12/2015 Echo: Mod AI.  [name] Paroxysmal atrial fibrillation ([place])    a. 12/2015 started on Xarelto (CHA2DS2VASc = 4-5).  . Peripheral vascular disease ([place])   . Pneumonia   . Prostate cancer ([place])   . Renal cell carcinoma ([place])   . Sleep apnea    Does not like  CPAP  . Spinal stenosis in cervical region 10/12/2016  . Venous insufficiency     Patient Active Problem List   Diagnosis Date Noted  . Generalized weakness 04/26/2020  . Hyperkalemia 04/26/2020  . Hyponatremia 04/26/2020  . Prolonged QT interval 04/26/2020  . Aortic root enlargement ([place]) 02/24/2020  . Symptomatic anemia 02/04/2020  . 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 Goals of care, counseling/discussion   . Palliative care by specialist   . DNR (do not resuscitate) discussion   . ARF (acute renal failure) ([place]) 01/22/2020  . Ascending aortic aneurysm ([place]) 08/04/2019  . 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 CKD (chronic kidney disease), stage III 06/04/2019  . Renal cell carcinoma ([place]) 06/04/2019  . Gross hematuria 06/04/2019  . COPD exacerbation ([place]) 11/05/2018  . Palpitations 10/22/2018  . Counseling regarding advance care planning and goals of care 06/22/2018  . AKI (acute kidney injury) ([place])   . Anemia   . Sepsis ([place]) 06/05/2018  . Acute lower UTI 06/05/2018  . Colon polyp 05/21/2018  . Aortic valve regurgitation 04/14/2018  . Medication management 04/14/2018  . Thoracic aortic aneurysm without rupture ([place]) 09/29/2017  . C5-C7 level with spinal cord injury with central cord syndrome, without evidence of spinal bone injury ([place]) 10/12/2016  . Head trauma, subsequent encounter 10/12/2016  . 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 Spinal stenosis in cervical  region 10/12/2016  . Cervical spondylosis 10/12/2016  . Syncope and collapse 10/12/2016  . Syncope 10/11/2016  . CAD (coronary artery disease)   . Chronic diastolic CHF (congestive heart failure) ([place])   . Hyperlipidemia   . Hypertensive heart disease   . Paroxysmal atrial fibrillation ([place]) 12/17/2015  . 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 Mild intermittent asthma 10/18/2015  . Aortic root dilatation ([place]) 10/17/2015  . Diabetes ([place]) 10/17/2015  . 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 Hand paresthesia 12/09/2014  . Elevated prostate specific antigen (PSA) 07/12/2014  . Allergic rhinitis 07/12/2014  . LBP (low back pain) 07/12/2014  . Palpitation 07/11/2014  . Patient's other noncompliance  with medication regimen 08/10/2013  . Chronic venous insufficiency 07/23/2012  . Obstructive apnea 04/11/2011  . Essential (primary) hypertension 09/07/2010  . 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 Anxiety, generalized 04/04/2010  . Cardiac conduction disorder 02/27/2010  . BPH (benign prostatic hyperplasia) 09/13/2009  . Abnormal findings on examination of genitourinary organs 08/09/2009  . Hereditary and idiopathic neuropathy 07/05/2009    Past Surgical History:  Procedure Laterality Date  . AV FISTULA PLACEMENT Left 01/26/2020   Procedure: left ARTERIOVENOUS (AV) FISTULA CREATION;  Surgeon: Waynetta Sandy, MD;  Location: [facility];  Service: Vascular;  Laterality: Left;  . BACK SURGERY    . BASCILIC VEIN TRANSPOSITION Left 04/28/2020   Procedure: LEFT ARM SECOND STAGE BASILIC VEIN TRANSPOSITION;  Surgeon: Waynetta Sandy, MD;  Location: [facility];  Service: Vascular;  Laterality: Left;  . CARDIAC CATHETERIZATION N/A 12/18/2015   Procedure: Left Heart Cath and Coronary Angiography;  Surgeon: Leonie Man, MD;  Location: [facility];  Service: Cardiovascular;  Laterality: N/A;  . DIAGNOSTIC LAPAROSCOPY     partial  colectomy  . IR FLUORO GUIDE CV LINE RIGHT  06/28/2019  . IR FLUORO GUIDE CV LINE RIGHT  01/24/2020  . IR REMOVAL TUN CV CATH W/O FL  07/26/2019  . IR US GUIDE VASC ACCESS RIGHT  06/28/2019  . IR US GUIDE VASC ACCESS RIGHT  01/24/2020  . LAPAROSCOPIC PARTIAL COLECTOMY Right 05/21/2018   Procedure: LAPAROSCOPIC RIGHT  COLECTOMY ERAS PATHWAY;  Surgeon: Leighton Ruff, MD;  Location: [facility];  Service: General;  Laterality: Right;  . [place]   "replaced a disc"       Family History  Problem Relation Age of Onset  . Emphysema Father   . Asthma Father   . Liver disease Father        tumor  . Heart disease Mother   . Hypertension Mother   . Asthma Sister   . Hypertension Son     Social History   Tobacco Use  . Smoking status: Former Smoker    Types: Cigars    Quit date: 09/02/1977    Years since quitting: 42.7  . Smokeless tobacco: Never Used  . Tobacco comment:    Vaping Use  . Vaping Use: Never used  Substance Use Topics  . Alcohol use: Not Currently    Comment: "stopped drinking alcohol in ~ 1980; just drank a little on the weekends when I did drink"  . Drug use: No    Home Medications Prior to Admission medications   Medication Sig Start Date End Date Taking? Authorizing Provider  amLODipine (NORVASC) 5 MG tablet Take 1 tablet (5 mg total) by mouth daily. 05/02/20   Hosie Poisson, MD  atorvastatin (LIPITOR) 40 MG tablet Take 40 mg by mouth daily as needed (pt states he only takes when his cholesterol is high).     [provider]  AURYXIA 1 GM 210 MG(Fe) tablet Take 420 mg by mouth 3 (three) times daily. 03/23/20   [provider]  cetirizine (ZYRTEC) 10 MG tablet TAKE 1 TABLET (10 MG TOTAL) BY MOUTH DAILY AS NEEDED (SEASONAL ALLERGIES). Patient taking differently: Take 10 mg by mouth daily as needed for allergies (seasonal allergies).  11/12/19   [name], DO  Darbepoetin Alfa (ARANESP) 200 MCG/0.4ML SOSY injection Inject  0.4 mLs (200 mcg total) into the vein every Thursday with hemodialysis. 05/04/20   Hosie Poisson, MD  glucose blood (ONETOUCH ULTRA)  test strip Check FSBS twice a day. Dx: E11.21, N18.32 Patient taking differently: 1 each by Other route See admin instructions. Check FSBS twice a day. Dx: E11.21, N18.32 08/16/19   Fulp, Ander Gaster, MD  ixazomib citrate (NINLARO) 3 MG capsule Take 1 capsule (3 mg) by mouth weekly, 3 weeks on, 1 week off, repeat every 4 weeks. Take on an empty stomach 1hr before or 2hr after meals. Patient taking differently: Take 3 mg by mouth See admin instructions. Take 1 capsule (3 mg) by mouth weekly, 3 weeks on, 1 week off, repeat every 4 weeks. Take on an empty stomach 1hr before or 2hr after meals. 03/10/20   Brunetta Genera, MD  melatonin 3 MG TABS tablet Take 1 tablet (3 mg total) by mouth at bedtime. 05/02/20   Hosie Poisson, MD  senna-docusate (SENOKOT-S) 8.6-50 MG tablet Take 1 tablet by mouth at bedtime as needed for mild constipation. 05/02/20   Hosie Poisson, MD  tamsulosin (FLOMAX) 0.4 MG CAPS capsule Take 1 capsule (0.4 mg total) by mouth daily. 10/15/19   [name], DO    Allergies    Amoxicillin and Penicillins  Review of Systems   Review of Systems  Constitutional: Negative for appetite change and fever.  HENT: Negative for congestion and sore throat.   Eyes: Negative for visual disturbance.  Respiratory: Positive for shortness of breath.   Cardiovascular: Positive for chest pain. Negative for palpitations.  Gastrointestinal: Negative for abdominal pain, diarrhea, nausea and vomiting.  Genitourinary: Negative for flank pain.  Musculoskeletal: Negative for back pain, myalgias, neck pain and neck stiffness.  Skin: Negative for rash.  Allergic/Immunologic: Negative for immunocompromised state.  Neurological: Positive for syncope. Negative for dizziness, seizures, weakness, light-headedness, numbness and headaches.  Psychiatric/Behavioral: Negative  for confusion.    Physical Exam Updated Vital Signs BP (!) 113/6 (BP Location: Right Arm)   Pulse 87   Temp 98 F (36.7 C) (Oral)   Resp 18   Ht 5\' 9"  (1.753 m)   Wt 81.6 kg   SpO2 97%   BMI 26.58 kg/m   Physical Exam Vitals and nursing note reviewed.  Constitutional:      Appearance: He is well-developed. He is not toxic-appearing.     Comments: Chronically ill-appearing elderly gentleman. No acute distress. Nasal cannula in place.  HENT:     Head: Normocephalic.  Eyes:     Conjunctiva/sclera: Conjunctivae normal.  Cardiovascular:     Rate and Rhythm: Normal rate and regular rhythm.     Pulses: Normal pulses.     Heart sounds: Normal heart sounds. No murmur heard.  No friction rub. No gallop.   Pulmonary:     Effort: Pulmonary effort is normal. No respiratory distress.     Breath sounds: No stridor. No wheezing, rhonchi or rales.     Comments: Lungs are clear to auscultation bilaterally. No increased work of breathing. Chest wall is nontender. Chest:     Chest wall: No tenderness.  Abdominal:     General: There is no distension.     Palpations: Abdomen is soft. There is no mass.     Tenderness: There is no abdominal tenderness. There is no right CVA tenderness, left CVA tenderness, guarding or rebound.     Hernia: No hernia is present.  Musculoskeletal:     Cervical back: Neck supple.     Right lower leg: No edema.     Left lower leg: No edema.  Skin:    General: Skin is  warm and dry.  Neurological:     Mental Status: He is alert.  Psychiatric:        Behavior: Behavior normal.     ED Results / Procedures / Treatments   Labs (all labs ordered are listed, but only abnormal results are displayed) Labs Reviewed  CBC WITH DIFFERENTIAL/PLATELET - Abnormal; Notable for the following components:      Result Value   RBC 2.30 (*)    Hemoglobin 7.3 (*)    HCT 25.1 (*)    MCV 109.1 (*)    MCHC 29.1 (*)    RDW 22.4 (*)    nRBC 0.4 (*)    All other components  within normal limits  COMPREHENSIVE METABOLIC PANEL - Abnormal; Notable for the following components:   Sodium 133 (*)    Chloride 93 (*)    Glucose, Bld 108 (*)    BUN 25 (*)    Creatinine, Ser 6.66 (*)    Total Protein 5.9 (*)    Albumin 2.8 (*)    GFR calc non Af Amer 7 (*)    GFR calc Af Amer 9 (*)    Anion gap 16 (*)    All other components within normal limits  TROPONIN I (HIGH SENSITIVITY) - Abnormal; Notable for the following components:   Troponin I (High Sensitivity) 60 (*)    All other components within normal limits  CBG MONITORING, ED  TROPONIN I (HIGH SENSITIVITY)    EKG None  Radiology DG Chest Port 1 View  Result Date: 05/04/2020 CLINICAL DATA:  Shortness of breath. EXAM: PORTABLE CHEST 1 VIEW COMPARISON:  April 25, 2020 FINDINGS: There is stable right-sided venous catheter positioning. Mild, diffuse chronic appearing increased lung markings are seen. There is no evidence of acute infiltrate, pleural effusion or pneumothorax. The cardiac silhouette is markedly enlarged. The visualized skeletal structures are unremarkable. IMPRESSION: Stable cardiomegaly without evidence of acute or active cardiopulmonary disease. Electronically Signed   By: Virgina Norfolk M.D.   On: 05/04/2020 18:15    Procedures Procedures (including critical care time)  Medications Ordered in ED Medications  LORazepam (ATIVAN) injection 0.5 mg (0.5 mg Intravenous Given 05/06/20 0451)    ED Course  I have reviewed the triage vital signs and the nursing notes.  Pertinent labs & imaging results that were available during my care of the patient were reviewed by me and considered in my medical decision making (see chart for details).    MDM Rules/Calculators/A&P                          74 year old male with multiple, complex medical history presenting by EMS from [facility] with concern for hypertension with chest pain or shortness of breath that began approximately 1 hour prior to  arrival. Spoke with EMS who reports that patient's blood pressure appears labile in route. Most readings were normotensive. He was given IV fluids in route. He has been normotensive since arriving in the ER. He reports that his chest pain or shortness of breath have since resolved. He is scheduled to be dialyzed in the morning, but does not appear volume overloaded.  Patient has been seen and evaluated multiple times over the last few month for syncope. Most recently he was seen and evaluated on 9/2 after presenting with similar concerns. After speaking with him today, I am very suspicious that he is here because he does not want to be at his facility.  Hemodynamically stable  in the ER. Creatinine is elevated, but no metabolic derangements. Hemoglobin is stable at 7.3. Troponin is elevated, but downtrending from previous. Doubt ACS, volume overload, pneumonia, aortic dissection.  After discussing the patient's labs with him, he is requesting to be transfused with blood, which I discussed with the patient is not indicated at this time as his hemoglobin is stable from previous. I was able to coordinate transportation to his dialysis facility so he can be dialyzed this morning. When patient was updated with this information, he became upset and was requesting to be admitted to the hospital and dialyzed at our facility. States that last time he needed to be dialyzed he was dialyzed here, and did not want to go to [facility]. Patient counseled on COVID-19 pandemic and the hospital capacity as well as indications for emergent hemodialysis. [facility] requested confirmation of patient's hemodialysis time, which I confirmed with Dr. Justin Mend, nephrology. At time of discharge, he is hemodynamically stable and in no acute distress. Safe for discharge to [facility] dialysis center. Did place consult with palliative care given concerns that [facility] staff reported with frequent EMS calls and increased ER usage over the last  few months to provide additional education to patient and family. ER return precautions given. Safe for discharge at this time.  Final Clinical Impression(s) / ED Diagnoses Final diagnoses:  Near syncope  Dialysis patient [facility])    Rx / [place] Orders ED Discharge Orders    None       Joanne Gavel, PA-C 05/06/20 1049    Mesner, Corene Cornea, MD 05/10/20 [phone]

## 2020-05-06 NOTE — ED Notes (Signed)
Pt transferred by [facility] to dialysis appt- confirmed time of appt w nephrologist and pt spoke w MD about importance of going to dialysis and it not being necessary to be admitted for emergent dialysys

## 2020-05-09 ENCOUNTER — Other Ambulatory Visit: Payer: Self-pay

## 2020-05-09 ENCOUNTER — Inpatient Hospital Stay (HOSPITAL_COMMUNITY)
Admission: EM | Admit: 2020-05-09 | Discharge: 2020-05-10 | DRG: 686 | Disposition: A | Discharge status: Hospice | Payer: Medicare Other | Source: Skilled Nursing Facility | Attending: Internal Medicine | Admitting: Internal Medicine

## 2020-05-09 ENCOUNTER — Emergency Department (HOSPITAL_COMMUNITY): Payer: Medicare Other

## 2020-05-09 DIAGNOSIS — E1122 Type 2 diabetes mellitus with diabetic chronic kidney disease: Secondary | ICD-10-CM | POA: Diagnosis present

## 2020-05-09 DIAGNOSIS — I712 Thoracic aortic aneurysm, without rupture: Secondary | ICD-10-CM | POA: Diagnosis present

## 2020-05-09 DIAGNOSIS — Z66 Do not resuscitate: Secondary | ICD-10-CM | POA: Diagnosis present

## 2020-05-09 DIAGNOSIS — I452 Bifascicular block: Secondary | ICD-10-CM | POA: Diagnosis present

## 2020-05-09 DIAGNOSIS — R55 Syncope and collapse: Secondary | ICD-10-CM | POA: Diagnosis not present

## 2020-05-09 DIAGNOSIS — E782 Mixed hyperlipidemia: Secondary | ICD-10-CM | POA: Diagnosis not present

## 2020-05-09 DIAGNOSIS — G609 Hereditary and idiopathic neuropathy, unspecified: Secondary | ICD-10-CM | POA: Diagnosis present

## 2020-05-09 DIAGNOSIS — D472 Monoclonal gammopathy: Secondary | ICD-10-CM | POA: Diagnosis present

## 2020-05-09 DIAGNOSIS — E785 Hyperlipidemia, unspecified: Secondary | ICD-10-CM | POA: Diagnosis present

## 2020-05-09 DIAGNOSIS — G4733 Obstructive sleep apnea (adult) (pediatric): Secondary | ICD-10-CM | POA: Diagnosis present

## 2020-05-09 DIAGNOSIS — Z825 Family history of asthma and other chronic lower respiratory diseases: Secondary | ICD-10-CM

## 2020-05-09 DIAGNOSIS — Z9981 Dependence on supplemental oxygen: Secondary | ICD-10-CM

## 2020-05-09 DIAGNOSIS — J449 Chronic obstructive pulmonary disease, unspecified: Secondary | ICD-10-CM | POA: Diagnosis present

## 2020-05-09 DIAGNOSIS — I351 Nonrheumatic aortic (valve) insufficiency: Secondary | ICD-10-CM | POA: Diagnosis present

## 2020-05-09 DIAGNOSIS — I2510 Atherosclerotic heart disease of native coronary artery without angina pectoris: Secondary | ICD-10-CM | POA: Diagnosis present

## 2020-05-09 DIAGNOSIS — J9611 Chronic respiratory failure with hypoxia: Secondary | ICD-10-CM | POA: Diagnosis present

## 2020-05-09 DIAGNOSIS — R451 Restlessness and agitation: Secondary | ICD-10-CM | POA: Diagnosis present

## 2020-05-09 DIAGNOSIS — Z20822 Contact with and (suspected) exposure to covid-19: Secondary | ICD-10-CM | POA: Diagnosis present

## 2020-05-09 DIAGNOSIS — Z7189 Other specified counseling: Secondary | ICD-10-CM | POA: Diagnosis not present

## 2020-05-09 DIAGNOSIS — N401 Enlarged prostate with lower urinary tract symptoms: Secondary | ICD-10-CM | POA: Diagnosis present

## 2020-05-09 DIAGNOSIS — N138 Other obstructive and reflux uropathy: Secondary | ICD-10-CM | POA: Diagnosis present

## 2020-05-09 DIAGNOSIS — I132 Hypertensive heart and chronic kidney disease with heart failure and with stage 5 chronic kidney disease, or end stage renal disease: Secondary | ICD-10-CM | POA: Diagnosis present

## 2020-05-09 DIAGNOSIS — Z515 Encounter for palliative care: Secondary | ICD-10-CM | POA: Diagnosis not present

## 2020-05-09 DIAGNOSIS — Z9115 Patient's noncompliance with renal dialysis: Secondary | ICD-10-CM

## 2020-05-09 DIAGNOSIS — I10 Essential (primary) hypertension: Secondary | ICD-10-CM

## 2020-05-09 DIAGNOSIS — C9000 Multiple myeloma not having achieved remission: Secondary | ICD-10-CM | POA: Diagnosis present

## 2020-05-09 DIAGNOSIS — R079 Chest pain, unspecified: Secondary | ICD-10-CM | POA: Diagnosis present

## 2020-05-09 DIAGNOSIS — E1169 Type 2 diabetes mellitus with other specified complication: Secondary | ICD-10-CM | POA: Diagnosis not present

## 2020-05-09 DIAGNOSIS — I480 Paroxysmal atrial fibrillation: Secondary | ICD-10-CM | POA: Diagnosis present

## 2020-05-09 DIAGNOSIS — E871 Hypo-osmolality and hyponatremia: Secondary | ICD-10-CM | POA: Diagnosis present

## 2020-05-09 DIAGNOSIS — Z8249 Family history of ischemic heart disease and other diseases of the circulatory system: Secondary | ICD-10-CM

## 2020-05-09 DIAGNOSIS — I252 Old myocardial infarction: Secondary | ICD-10-CM

## 2020-05-09 DIAGNOSIS — C649 Malignant neoplasm of unspecified kidney, except renal pelvis: Secondary | ICD-10-CM | POA: Diagnosis present

## 2020-05-09 DIAGNOSIS — D631 Anemia in chronic kidney disease: Secondary | ICD-10-CM | POA: Diagnosis present

## 2020-05-09 DIAGNOSIS — Z992 Dependence on renal dialysis: Secondary | ICD-10-CM

## 2020-05-09 DIAGNOSIS — Z85038 Personal history of other malignant neoplasm of large intestine: Secondary | ICD-10-CM

## 2020-05-09 DIAGNOSIS — E119 Type 2 diabetes mellitus without complications: Secondary | ICD-10-CM

## 2020-05-09 DIAGNOSIS — N186 End stage renal disease: Secondary | ICD-10-CM | POA: Diagnosis present

## 2020-05-09 DIAGNOSIS — D649 Anemia, unspecified: Secondary | ICD-10-CM

## 2020-05-09 DIAGNOSIS — D696 Thrombocytopenia, unspecified: Secondary | ICD-10-CM | POA: Diagnosis present

## 2020-05-09 DIAGNOSIS — N400 Enlarged prostate without lower urinary tract symptoms: Secondary | ICD-10-CM | POA: Diagnosis present

## 2020-05-09 DIAGNOSIS — Z880 Allergy status to penicillin: Secondary | ICD-10-CM

## 2020-05-09 DIAGNOSIS — Z87891 Personal history of nicotine dependence: Secondary | ICD-10-CM

## 2020-05-09 DIAGNOSIS — I5032 Chronic diastolic (congestive) heart failure: Secondary | ICD-10-CM | POA: Diagnosis present

## 2020-05-09 DIAGNOSIS — I272 Pulmonary hypertension, unspecified: Secondary | ICD-10-CM | POA: Diagnosis present

## 2020-05-09 DIAGNOSIS — F419 Anxiety disorder, unspecified: Secondary | ICD-10-CM | POA: Diagnosis not present

## 2020-05-09 LAB — CBC WITH DIFFERENTIAL/PLATELET
Abs Immature Granulocytes: 0.06 10*3/uL (ref 0.00–0.07)
Basophils Absolute: 0 10*3/uL (ref 0.0–0.1)
Basophils Relative: 0 %
Eosinophils Absolute: 0 10*3/uL (ref 0.0–0.5)
Eosinophils Relative: 0 %
HCT: 25.3 % — ABNORMAL LOW (ref 39.0–52.0)
Hemoglobin: 7.6 g/dL — ABNORMAL LOW (ref 13.0–17.0)
Immature Granulocytes: 1 %
Lymphocytes Relative: 10 %
Lymphs Abs: 0.7 10*3/uL (ref 0.7–4.0)
MCH: 32.8 pg (ref 26.0–34.0)
MCHC: 30 g/dL (ref 30.0–36.0)
MCV: 109.1 fL — ABNORMAL HIGH (ref 80.0–100.0)
Monocytes Absolute: 0.6 10*3/uL (ref 0.1–1.0)
Monocytes Relative: 9 %
Neutro Abs: 5.1 10*3/uL (ref 1.7–7.7)
Neutrophils Relative %: 80 %
Platelets: 121 10*3/uL — ABNORMAL LOW (ref 150–400)
RBC: 2.32 MIL/uL — ABNORMAL LOW (ref 4.22–5.81)
RDW: 22.5 % — ABNORMAL HIGH (ref 11.5–15.5)
WBC: 6.4 10*3/uL (ref 4.0–10.5)
nRBC: 0.3 % — ABNORMAL HIGH (ref 0.0–0.2)

## 2020-05-09 LAB — CBC
HCT: 25 % — ABNORMAL LOW (ref 39.0–52.0)
Hemoglobin: 7.6 g/dL — ABNORMAL LOW (ref 13.0–17.0)
MCH: 32.9 pg (ref 26.0–34.0)
MCHC: 30.4 g/dL (ref 30.0–36.0)
MCV: 108.2 fL — ABNORMAL HIGH (ref 80.0–100.0)
Platelets: 119 10*3/uL — ABNORMAL LOW (ref 150–400)
RBC: 2.31 MIL/uL — ABNORMAL LOW (ref 4.22–5.81)
RDW: 22.6 % — ABNORMAL HIGH (ref 11.5–15.5)
WBC: 6.4 10*3/uL (ref 4.0–10.5)
nRBC: 0.3 % — ABNORMAL HIGH (ref 0.0–0.2)

## 2020-05-09 LAB — COMPREHENSIVE METABOLIC PANEL
ALT: 8 U/L (ref 0–44)
AST: 28 U/L (ref 15–41)
Albumin: 2.9 g/dL — ABNORMAL LOW (ref 3.5–5.0)
Alkaline Phosphatase: 56 U/L (ref 38–126)
Anion gap: 14 (ref 5–15)
BUN: 29 mg/dL — ABNORMAL HIGH (ref 8–23)
CO2: 26 mmol/L (ref 22–32)
Calcium: 9.6 mg/dL (ref 8.9–10.3)
Chloride: 90 mmol/L — ABNORMAL LOW (ref 98–111)
Creatinine, Ser: 7.64 mg/dL — ABNORMAL HIGH (ref 0.61–1.24)
GFR calc Af Amer: 7 mL/min — ABNORMAL LOW (ref >60)
GFR calc non Af Amer: 6 mL/min — ABNORMAL LOW (ref >60)
Glucose, Bld: 93 mg/dL (ref 70–99)
Potassium: 4.3 mmol/L (ref 3.5–5.1)
Sodium: 130 mmol/L — ABNORMAL LOW (ref 135–145)
Total Bilirubin: 0.9 mg/dL (ref 0.3–1.2)
Total Protein: 5.6 g/dL — ABNORMAL LOW (ref 6.5–8.1)

## 2020-05-09 LAB — CREATININE, SERUM
Creatinine, Ser: 7.98 mg/dL — ABNORMAL HIGH (ref 0.61–1.24)
GFR calc Af Amer: 7 mL/min — ABNORMAL LOW (ref >60)
GFR calc non Af Amer: 6 mL/min — ABNORMAL LOW (ref >60)

## 2020-05-09 LAB — GLUCOSE, CAPILLARY
Glucose-Capillary: 95 mg/dL (ref 70–99)
Glucose-Capillary: 83 mg/dL (ref 70–99)

## 2020-05-09 LAB — PREPARE RBC (CROSSMATCH)

## 2020-05-09 LAB — SARS CORONAVIRUS 2 BY RT PCR (HOSPITAL ORDER, PERFORMED IN ~~LOC~~ HOSPITAL LAB): SARS Coronavirus 2: NEGATIVE

## 2020-05-09 LAB — PHOSPHORUS: Phosphorus: 6 mg/dL — ABNORMAL HIGH (ref 2.5–4.6)

## 2020-05-09 LAB — MAGNESIUM: Magnesium: 2.3 mg/dL (ref 1.7–2.4)

## 2020-05-09 LAB — HEMOGLOBIN A1C
Hgb A1c MFr Bld: 4.4 % — ABNORMAL LOW (ref 4.8–5.6)
Mean Plasma Glucose: 79.58 mg/dL

## 2020-05-09 MED ORDER — INSULIN ASPART 100 UNIT/ML ~~LOC~~ SOLN: 0.0000 [IU] | Freq: Three times a day (TID) | SUBCUTANEOUS | Status: DC

## 2020-05-09 MED ORDER — FERRIC CITRATE 1 GM 210 MG(FE) PO TABS
420.0000 mg | ORAL_TABLET | Freq: Three times a day (TID) | ORAL | Status: DC
  Filled 2020-05-09 (×2): qty 2

## 2020-05-09 MED ORDER — SENNOSIDES-DOCUSATE SODIUM 8.6-50 MG PO TABS: 1.0000 | ORAL_TABLET | Freq: Every evening | ORAL | Status: DC | PRN

## 2020-05-09 MED ORDER — ACETAMINOPHEN 650 MG RE SUPP: 650.0000 mg | Freq: Four times a day (QID) | RECTAL | Status: DC | PRN

## 2020-05-09 MED ORDER — OXYCODONE HCL 5 MG PO TABS
5.0000 mg | ORAL_TABLET | Freq: Four times a day (QID) | ORAL | Status: DC | PRN
  Administered 2020-05-09 – 2020-05-10 (×2): 5 mg via ORAL
  Filled 2020-05-09 (×2): qty 1

## 2020-05-09 MED ORDER — SODIUM CHLORIDE 0.9% FLUSH
3.0000 mL | Freq: Two times a day (BID) | INTRAVENOUS | Status: DC
  Administered 2020-05-09: 3 mL via INTRAVENOUS

## 2020-05-09 MED ORDER — LORAZEPAM 1 MG PO TABS
0.5000 mg | ORAL_TABLET | Freq: Once | ORAL | Status: AC
  Administered 2020-05-09: 0.5 mg via ORAL
  Filled 2020-05-09: qty 1

## 2020-05-09 MED ORDER — INSULIN ASPART 100 UNIT/ML ~~LOC~~ SOLN: 0.0000 [IU] | Freq: Every day | SUBCUTANEOUS | Status: DC

## 2020-05-09 MED ORDER — ACETAMINOPHEN 325 MG PO TABS: 650.0000 mg | ORAL_TABLET | Freq: Four times a day (QID) | ORAL | Status: DC | PRN

## 2020-05-09 MED ORDER — MELATONIN 3 MG PO TABS
3.0000 mg | ORAL_TABLET | Freq: Every day | ORAL | Status: DC
  Administered 2020-05-09: 3 mg via ORAL
  Filled 2020-05-09: qty 1

## 2020-05-09 MED ORDER — ORAL CARE MOUTH RINSE
15.0000 mL | Freq: Two times a day (BID) | OROMUCOSAL | Status: DC
  Administered 2020-05-09: 15 mL via OROMUCOSAL

## 2020-05-09 MED ORDER — TAMSULOSIN HCL 0.4 MG PO CAPS
0.4000 mg | ORAL_CAPSULE | Freq: Every day | ORAL | Status: DC
  Administered 2020-05-09 – 2020-05-10 (×2): 0.4 mg via ORAL
  Filled 2020-05-09 (×2): qty 1

## 2020-05-09 MED ORDER — ATORVASTATIN CALCIUM 40 MG PO TABS
40.0000 mg | ORAL_TABLET | Freq: Every evening | ORAL | Status: DC
  Administered 2020-05-09: 40 mg via ORAL
  Filled 2020-05-09: qty 1

## 2020-05-09 MED ORDER — HEPARIN SODIUM (PORCINE) 5000 UNIT/ML IJ SOLN
5000.0000 [IU] | Freq: Three times a day (TID) | INTRAMUSCULAR | Status: DC
  Administered 2020-05-09 – 2020-05-10 (×3): 5000 [IU] via SUBCUTANEOUS
  Filled 2020-05-09 (×3): qty 1

## 2020-05-09 NOTE — Progress Notes (Signed)
NEW ADMISSION NOTE New Admission Note:   Arrival Method: stretcher from ED  Mental Orientation: axox4 Telemetry: 8m03 Assessment: Completed Skin: see skin assessment  IV:RAC 20G Pain:denies any pain  Tubes: RIGHT HD cath  Safety Measures: Safety Fall Prevention Plan has been discussed.  Admission: To be Completed [facility] Orientation: Patient has been orientated to the room, unit and staff.  Family: none at bedside   Orders have been reviewed and implemented. Will continue to monitor the patient. Call light has been placed within reach and bed alarm has been activated.   Paulla Fore, RN, BSN

## 2020-05-09 NOTE — Discharge Planning (Signed)
From [facility].  TOC following for disposition needs.

## 2020-05-09 NOTE — Consult Note (Signed)
Renal Service Consult Note [facility]  [name] [name] 05/09/2020 [name] Requesting Physician: Dr Doristine Bosworth, R.   Reason for Consult:  ESRD pt w/ recurrent syncope at HD HPI: The patient is a 74 y.o. year-old w/ hx of untreated multiple myeloma, untreated renal cell Ca, COPD on home O2, HL, PAF, anemia, DM2 and ESRD started HD this summer, admitted for syncopal event prior to HD this am at the OP HD unit.  Pt sent to ED.  Asked to see for ESRD.    Pt seen in ED, no c/o's at this time.  After last admit here was dc'd to SNF. Nonambulatory. Hx recurrent syncope over the last 1-2 months in spite of vol excess.   ROS  denies CP  no joint pain   no HA  no blurry vision  no rash  no diarrhea  no nausea/ vomiting   Past Medical History  Past Medical History:  Diagnosis Date  . Abnormal CT of liver    a. nodular contour suggesting cirrhosis 05/2018.  [name] Abnormal LFTs (liver function tests)   . Anemia   . Anxiety   . Aortic root dilatation ([place])   . Ascending aortic aneurysm ([place])   . BPH with urinary obstruction   . Bradycardia   . C5-C7 level with spinal cord injury with central cord syndrome, without evidence of spinal bone injury ([place]) 10/12/2016  . CAD (coronary artery disease)    a. 12/2015 NSTEMI/Cath (in setting of PAF):  LM nl, LAD 30p,  LCX 53m RCA  ok, AM 100%, RPDA1 40, RPDA 2 60 ->Med Rx.  . Childhood asthma   . Chronic diastolic CHF (congestive heart failure) ([place]    a. 12/2015 Echo: EF 50-55%, mod AI, mod Ao root dil, mild MR, mod dil LA, mod RA.  . CKD (chronic kidney disease), stage III   . Colon cancer ([place]   . COPD (chronic obstructive pulmonary disease) ([place]    pt denies at preop  . Diabetes mellitus type II    diet controlled  . Dysrhythmia   . History of syncope   . Hyperlipidemia   . Hypertension   . Hypertensive heart disease   . Kidney lump 04/04/2010   Overview:  Renal Cell Carcinoma   . Light chain myeloma ([place]   . Moderate  aortic insufficiency    a. 12/2015 Echo: Mod AI.  .Marland KitchenParoxysmal atrial fibrillation ([facility]    a. 12/2015 started on Xarelto (CHA2DS2VASc = 4-5).  . Peripheral vascular disease ([place]   . Pneumonia   . Prostate cancer ([place]   . Renal cell carcinoma ([place]   . Sleep apnea    Does not like  CPAP  . Spinal stenosis in cervical region 10/12/2016  . Venous insufficiency    Past Surgical History  Past Surgical History:  Procedure Laterality Date  . AV FISTULA PLACEMENT Left 01/26/2020   Procedure: left ARTERIOVENOUS (AV) FISTULA CREATION;  Surgeon: CWaynetta Sandy MD;  [facility]  Service: Vascular;  Laterality: Left;  . BACK SURGERY    . BASCILIC VEIN TRANSPOSITION Left 04/28/2020   Procedure: LEFT ARM SECOND STAGE BASILIC VEIN TRANSPOSITION;  Surgeon: CWaynetta Sandy MD;  [facility]  Service: Vascular;  Laterality: Left;  . CARDIAC CATHETERIZATION N/A 12/18/2015   Procedure: Left Heart Cath and Coronary Angiography;  Surgeon: DLeonie Man MD;  [facility];  Service: Cardiovascular;  Laterality: N/A;  . DIAGNOSTIC LAPAROSCOPY  partial colectomy  . IR FLUORO GUIDE CV LINE RIGHT  06/28/2019  . IR FLUORO GUIDE CV LINE RIGHT  01/24/2020  . IR REMOVAL TUN CV CATH W/O FL  07/26/2019  . IR US GUIDE VASC ACCESS RIGHT  06/28/2019  . IR US GUIDE VASC ACCESS RIGHT  01/24/2020  . LAPAROSCOPIC PARTIAL COLECTOMY Right 05/21/2018   Procedure: LAPAROSCOPIC RIGHT  COLECTOMY ERAS PATHWAY;  Surgeon: Leighton Ruff, MD;  Location: [facility];  Service: General;  Laterality: Right;  . [place]   "replaced a disc"   Family History  Family History  Problem Relation Age of Onset  . Emphysema Father   . Asthma Father   . Liver disease Father        tumor  . Heart disease Mother   . Hypertension Mother   . Asthma Sister   . Hypertension Son    Social History  reports that he quit smoking about 42 years ago. His smoking use included cigars. He  has never used smokeless tobacco. He reports previous alcohol use. He reports that he does not use drugs. Allergies  Allergies  Allergen Reactions  . Amoxicillin Other (See Comments)    Tolerates Unasyn. Can't move Has patient had a PCN reaction causing immediate rash, facial/tongue/throat swelling, SOB or lightheadedness with hypotension: No Has patient had a PCN reaction causing severe rash involving mucus membranes or skin necrosis: No Has patient had a PCN reaction that required hospitalization No Has patient had a PCN reaction occurring within the last 10 years: No If all of the above answers are "NO", then may proceed with Cephalosporin use.   [name] Penicillins Other (See Comments)    Tolerates Unasyn. Can't move/dizziness Has patient had a PCN reaction causing immediate rash, facial/tongue/throat swelling, SOB or lightheadedness with hypotension: No Has patient had a PCN reaction causing severe rash involving mucus membranes or skin necrosis: No Has patient had a PCN reaction that required hospitalization No Has patient had a PCN reaction occurring within the last 10 years: No If all of the above answers are "NO", then may proceed with Cephalosporin use.     Home medications Prior to Admission medications   Medication Sig Start Date End Date Taking? Authorizing Provider  amLODipine (NORVASC) 5 MG tablet Take 1 tablet (5 mg total) by mouth daily. Patient taking differently: Take 5 mg by mouth See admin instructions. Give on non dialysis days (M,W,F,Sun) only if SBP is higher than 140 05/02/20  Yes Hosie Poisson, MD  atorvastatin (LIPITOR) 40 MG tablet Take 40 mg by mouth every evening.    Yes [provider]  AURYXIA 1 GM 210 MG(Fe) tablet Take 420 mg by mouth 3 (three) times daily. 03/23/20  Yes [provider]  Darbepoetin Alfa (ARANESP) 200 MCG/0.4ML SOSY injection Inject 0.4 mLs (200 mcg total) into the vein every Thursday with hemodialysis. 05/04/20  Yes Hosie Poisson, MD  glucose blood (ONETOUCH ULTRA) test strip Check FSBS twice a day. Dx: E11.21, N18.32 Patient taking differently: 1 each by Other route See admin instructions. Check FSBS twice a day. Dx: E11.21, N18.32 08/16/19  Yes Fulp, Cammie, MD  HYDROcodone-acetaminophen (NORCO/VICODIN) 5-325 MG tablet Take 1 tablet by mouth every 6 (six) hours as needed for moderate pain.   Yes [provider]  ixazomib citrate (NINLARO) 3 MG capsule Take 1 capsule (3 mg) by mouth weekly, 3 weeks on, 1 week off, repeat every 4 weeks. Take on an empty stomach 1hr before or  2hr after meals. Patient taking differently: Take 3 mg by mouth See admin instructions. Take 1 capsule (3 mg) by mouth weekly, 3 weeks on, 1 week off, repeat every 4 weeks. Take on an empty stomach 1hr before or 2hr after meals. 03/10/20  Yes Brunetta Genera, MD  melatonin 3 MG TABS tablet Take 1 tablet (3 mg total) by mouth at bedtime. 05/02/20  Yes Hosie Poisson, MD  ondansetron (ZOFRAN-ODT) 4 MG disintegrating tablet Take 4 mg by mouth 2 (two) times daily as needed for nausea or vomiting.   Yes [provider]  OXYGEN Place 3 L into the nose.   Yes [provider]  senna-docusate (SENOKOT-S) 8.6-50 MG tablet Take 1 tablet by mouth at bedtime as needed for mild constipation. 05/02/20  Yes Hosie Poisson, MD  tamsulosin (FLOMAX) 0.4 MG CAPS capsule Take 1 capsule (0.4 mg total) by mouth daily. 10/15/19  Yes [name], DO  cetirizine (ZYRTEC) 10 MG tablet TAKE 1 TABLET (10 MG TOTAL) BY MOUTH DAILY AS NEEDED (SEASONAL ALLERGIES). Patient not taking: Reported on 05/09/2020 11/12/19   [name], DO     Vitals:   05/09/20 1330 05/09/20 1345 05/09/20 1400 05/09/20 1415  BP: (!) 138/94 (!) 137/96 (!) 136/100 (!) 139/97  Pulse: 69 69 67   Resp: _0 Temp:      TempSrc:      SpO2: 100% 100% 99%   Weight:      Height:       Exam Gen alert, no disterss No rash, cyanosis or  gangrene Sclera anicteric, throat clear  No jvd or bruits Chest clear bilat to bases RRR no MRG  Abd soft ntnd no mass or ascites +bs GU defer MS no joint effusions or deformity Ext 2+ bilat LE edema, no wounds or ulcers Neuro is alert, Ox 3 , nf    Assessment: 1. 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 Multiple myeloma - untreated, progressive 3. Anemia d/t myeloma/ esrd 4. ESRD - started HD this summer, due to myeloma 5. Renal mass - +PET c/w renal cell cancer, untreated  Plan: continued dialysis is unsafe for this patient w/ recurrent syncopal episodes, near-arrests at the OP HD unit, and these symptoms are most likely related to his untreated malignancies.  Recommend transition to hospice care, no further dialysis.  Have d/w pt's son who understands and is accepting of hospice transition.  Have d/w pt in ED.  Have d/w primary team. Per son they have [facility] for hospice.  Will sign off.   Kelly Splinter, MD 05/09/2020, 3:50 PM    Recent Labs  Lab 05/06/20 0334 05/09/20 0824  WBC 8.1 6.4  HGB 7.3* 7.6*   Recent Labs  Lab 05/06/20 0334 05/09/20 0824  K 4.6 4.3  BUN 25* 29*  CREATININE 6.66* 7.64*  CALCIUM 9.8 9.6

## 2020-05-09 NOTE — ED Notes (Signed)
Meal trays ordered

## 2020-05-09 NOTE — ED Notes (Signed)
Per MD, awaiting recs from nephrology as to whether to proceed w/ blood transfusion d/t plan for hospice and cessation of dialysis.

## 2020-05-09 NOTE — Consult Note (Addendum)
Consultation Note Date: 05/09/2020   Patient Name: [name] [name]  DOB: 1946-05-10  MRN: [id number]  Age / Sex: 74 y.o., male  PCP: [name], DO Referring Physician: Mckinley Jewel, MD  Reason for Consultation: Establishing goals of care  HPI/Patient Profile: 74 y.o. male  with multiple co-morbidities presenting to the emergency department on 05/09/2020 from the dialysis center with complaint of syncope. Per intake H&P, he was being transferred to his wheelchair to start his HD treatment and he blacked out for 30 seconds. Of note, patient was recently admitted for similar symptoms. His work-up including transthoracic echo and carotid duplex ultrasound. Cardiology recommended TED hose and hold Norvasc on hemodialysis days, there was concern for recurrent syncope likely secondary to fluid shifts postdialysis versus vasovagal episodes. ED course: Patient's vital signs stable, no leukocytosis, CBC shows hgb-7.6, MCV-109, platelets-121, sodium-130. COVID negative. Chest x-ray shows cardiomegaly without congestive failure. Patient was admitted for recurrent syncope. Per nephrology, continued dialysis is not safe due to recurrent syncopal episodes and near-arrests at the outpatient HD unit--no further dialysis.   Past Medical History: - ESRD on HD (Tues/Thurs/Sat) - Multiple Myeloma (light chain), progressive - Renal cell carcinoma - anemia due to CKD, myeloma, RCC - paroxysmal a-fib - severe pulmonary hypertension - CAD, medically managed - DM type 2 - COPD - ascending aortic aneurysm - sleep apnea - Hypertension - hyperlipidemia  Primary decision maker: Patient has capacity to make his own decisions, but does benefit from family support with complex medical decisions.   Clinical Assessment and Goals of Care: I have reviewed medical records including EPIC notes, labs and imaging, and met at  bedside with patient and son/[name] [name] to discuss diagnosis, prognosis, GOC, EOL wishes, disposition, and options.  I introduced Palliative Medicine as specialized medical care for people living with serious illness. It focuses on providing relief from the symptoms and stress of a serious illness.   We discussed a brief life review of the patient. Patient has worked in Architect, [employer] production planner, and other types of jobs. He lived in [place] while he and his wife were raising their children. He has 4 sons and 2 daughters. His wife was on dialysis for 16 years; she passed away in 24-Oct-2013. Patient is very religious and practices the [place].   As far as functional and nutritional status, there has been a significant decline. Son/[name] [name] states he can no longer ambulate without having a syncopal episode. He had previously lived independently at home, but was discharged to SNF after his hospitalization 8/24-8/31 due to increasing care requirements and safety concerns.  We discussed his current illness and what it means in the larger context of his ongoing co-morbidities.  Natural disease trajectory of ESRD was discussed, and prognosis after dialysis is stopped.  I attempted to elicit values and goals of care important to the patient. His faith is God is of utmost importance.     The difference between aggressive medical intervention and comfort care was considered in light of the patient's goals of  care. He understands that dialysis is no longer an option, as it not safe and has the potential to cause harm.   I discussed code status with patient and son. I have recommended having a DNR order in place, to which patient and son agree. Patient states if it his time to go, then it is God's will, however he believes God will perform a miracle.     Hospice Care services were explained and offered. Patient is currently enrolled I the outpatient palliative program with [facility]. Son states he would prefer patient  go to [facility]. We discussed that patient should be eligible for residential hospice as his prognosis is limited due to stopping dialysis.   Questions and concerns were addressed.  The family was encouraged to call with questions or concerns.     SUMMARY OF RECOMMENDATIONS   - code status changed to DNR/DNI (Gold form placed on chart) - referral to residential hospice--[facility] - oxycodone IR 5 mg every 6 hours as needed for pain - spiritual care consult  Code Status/Advance Care Planning:  DNR  Palliative Prophylaxis:   Frequent Pain Assessment, Oral Care and Turn Reposition  Additional Recommendations (Limitations, Scope, Preferences):  No Hemodialysis  Psycho-social/Spiritual:   Desire for further Chaplaincy support:yes  Prognosis:   < 2 weeks  Discharge Planning: Hospice facility      Primary Diagnoses: Present on Admission: . Paroxysmal atrial fibrillation ([place]) . CAD (coronary artery disease) . Essential (primary) hypertension . ESRD (end stage renal disease) ([place]) . Hyponatremia . Hyperlipidemia . Syncope . BPH (benign prostatic hyperplasia) . Chronic diastolic CHF (congestive heart failure) ([place]) . Light chain myeloma ([facility])   Scheduled Meds: . atorvastatin  40 mg Oral QPM  . ferric citrate  420 mg Oral TID  . heparin  5,000 Units Subcutaneous Q8H  . insulin aspart  0-5 Units Subcutaneous QHS  . insulin aspart  0-6 Units Subcutaneous TID WC  . mouth rinse  15 mL Mouth Rinse BID  . melatonin  3 mg Oral QHS  . sodium chloride flush  3 mL Intravenous Q12H  . tamsulosin  0.4 mg Oral Daily   Continuous Infusions: PRN Meds:.acetaminophen **OR** acetaminophen, senna-docusate  Medications Prior to Admission:  Prior to Admission medications   Medication Sig Start Date End Date Taking? Authorizing Provider  amLODipine (NORVASC) 5 MG tablet Take 1 tablet (5 mg total) by mouth daily. Patient taking differently: Take 5 mg by mouth See admin  instructions. Give on non dialysis days (M,W,F,Sun) only if SBP is higher than 140 05/02/20  Yes Hosie Poisson, MD  atorvastatin (LIPITOR) 40 MG tablet Take 40 mg by mouth every evening.    Yes [provider]  AURYXIA 1 GM 210 MG(Fe) tablet Take 420 mg by mouth 3 (three) times daily. 03/23/20  Yes [provider]  Darbepoetin Alfa (ARANESP) 200 MCG/0.4ML SOSY injection Inject 0.4 mLs (200 mcg total) into the vein every Thursday with hemodialysis. 05/04/20  Yes Hosie Poisson, MD  glucose blood (ONETOUCH ULTRA) test strip Check FSBS twice a day. Dx: E11.21, N18.32 Patient taking differently: 1 each by Other route See admin instructions. Check FSBS twice a day. Dx: E11.21, N18.32 08/16/19  Yes Fulp, Cammie, MD  HYDROcodone-acetaminophen (NORCO/VICODIN) 5-325 MG tablet Take 1 tablet by mouth every 6 (six) hours as needed for moderate pain.   Yes [provider]  ixazomib citrate (NINLARO) 3 MG capsule Take 1 capsule (3 mg) by mouth weekly, 3 weeks on, 1 week off, repeat  every 4 weeks. Take on an empty stomach 1hr before or 2hr after meals. Patient taking differently: Take 3 mg by mouth See admin instructions. Take 1 capsule (3 mg) by mouth weekly, 3 weeks on, 1 week off, repeat every 4 weeks. Take on an empty stomach 1hr before or 2hr after meals. 03/10/20  Yes Brunetta Genera, MD  melatonin 3 MG TABS tablet Take 1 tablet (3 mg total) by mouth at bedtime. 05/02/20  Yes Hosie Poisson, MD  ondansetron (ZOFRAN-ODT) 4 MG disintegrating tablet Take 4 mg by mouth 2 (two) times daily as needed for nausea or vomiting.   Yes [provider]  OXYGEN Place 3 L into the nose.   Yes [provider]  senna-docusate (SENOKOT-S) 8.6-50 MG tablet Take 1 tablet by mouth at bedtime as needed for mild constipation. 05/02/20  Yes Hosie Poisson, MD  tamsulosin (FLOMAX) 0.4 MG CAPS capsule Take 1 capsule (0.4 mg total) by mouth daily. 10/15/19  Yes [name], DO   cetirizine (ZYRTEC) 10 MG tablet TAKE 1 TABLET (10 MG TOTAL) BY MOUTH DAILY AS NEEDED (SEASONAL ALLERGIES). Patient not taking: Reported on 05/09/2020 11/12/19   [name], DO   Review of Systems  Neurological: Positive for weakness.    Physical Exam Vitals reviewed.  Constitutional:      General: He is not in acute distress.    Comments: Chronically ill-appearing  HENT:     Head: Normocephalic and atraumatic.  Pulmonary:     Effort: Pulmonary effort is normal.  Neurological:     Mental Status: He is alert and oriented to person, place, and time.     Motor: Weakness present.     Vital Signs: BP 117/84 (BP Location: Right Arm)   Pulse 72   Temp (!) 97.5 F (36.4 C) (Oral)   Resp 18   Ht _0  (1.753 m)   Wt 81 kg   SpO2 98%   BMI 26.37 kg/m  Pain Scale: 0-10    SpO2: SpO2: 98 % O2 Device:SpO2: 98 % O2 Flow Rate: .O2 Flow Rate (L/min): 4 L/min  IO: Intake/output summary: No intake or output data in the 24 hours ending 05/09/20 1922  LBM: Last BM Date: 05/08/20 Baseline Weight: Weight: 81 kg Most recent weight: Weight: 81 kg      Palliative Assessment/Data: 30%    Time In: 19:00 Time Out: 20:10 Time Total: 70 minutes Greater than 50%  of this time was spent counseling and coordinating care related to the above assessment and plan.  Signed by: Lavena Bullion, NP   Please contact Palliative Medicine Team phone at [phone] for questions and concerns.  For individual provider: See Shea Evans

## 2020-05-09 NOTE — H&P (Addendum)
History and Physical    VAHAN WADSWORTH FWY:[id number] DOB: July 24, 1946 DOA: 05/09/2020  PCP: [name], DO  Patient coming from: Dialysis center  I have personally briefly reviewed patient's old medical records in [place]  Chief Complaint: Syncope  HPI: [name] [name] is a 74 y.o. 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 fib, chronic anemia, ascending aortic aneurysm, renal cell carcinoma, type 2 diabetes mellitus, hyponatremia, chronic diastolic CHF, hypertension, hyperlipidemia presents to emergency department for evaluation of syncope.  Patient tells me that this morning he was being transferred to his wheelchair to obtain his hemodialysis session where he blacked out for 30 seconds.  He denies headache, blurry vision, lightheadedness, dizziness prior or after the syncopal episode.  He denies head trauma, seizures, chest pain, shortness of breath, palpitation, orthopnea, PND, worsening leg swelling, fever, chills, nausea, vomiting, abdominal pain.  He tells me that he was given prune juice at nursing home yesterday and since then he had 4-5 loose watery stool, green in color, nonbloody, denies association with abdominal pain, decreased appetite, weight loss.  He thinks that his passing out episodes is likely related to his low hemoglobin.  He tells me that he had similar symptoms in the past which improved after blood transfusion.  He denies current smoking, alcohol, illicit drug use.  Of note: Patient admitted recently and had ED visit with similar symptoms.  His work-up including transthoracic echo and carotid duplex ultrasound.  Cardiology was consulted recommended TED hose and hold Norvasc on hemodialysis days, there was concern for recurrent syncope likely secondary to fluid shifts postdialysis versus vasovagal  episodes.  ED Course: Upon arrival to ED: Patient's vital signs stable, afebrile with no leukocytosis, CBC shows H&H of 7.6/25.3, MCV of 109.1, platelet: 121.  CMP shows sodium of 130, COVID-19 negative.  Chest x-ray shows cardiomegaly without congestive failure.  Patient was agitated in ED therefore Ativan 0.5 mg once given.  EDP consulted nephrology and ordered 1 unit PRBC.  [facility] hospitalist consulted for admission for recurrent syncope.  Review of Systems: As per HPI otherwise negative.    Past Medical History:  Diagnosis Date  . Abnormal CT of liver    a. nodular contour suggesting cirrhosis 05/2018.  [name] Abnormal LFTs (liver function tests)   . Anemia   . Anxiety   . Aortic root dilatation ([place])   . Ascending aortic aneurysm ([place])   . BPH with urinary obstruction   . Bradycardia   . 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 CAD (coronary artery disease)    a. 12/2015 NSTEMI/Cath (in setting of PAF):  LM nl, LAD 30p,  LCX 70m RCA  ok, AM 100%, RPDA1 40, RPDA 2 60 ->Med Rx.  . Childhood asthma   . Chronic diastolic CHF (congestive heart failure) ([place]    a. 12/2015 Echo: EF 50-55%, mod AI, mod Ao root dil, mild MR, mod dil LA, mod RA.  . CKD (chronic kidney disease), stage III   . Colon cancer ([place]   . COPD (chronic obstructive pulmonary disease) ([place]    pt denies at preop  . Diabetes mellitus type II    diet controlled  . Dysrhythmia   . History of syncope   . Hyperlipidemia   . Hypertension   . Hypertensive heart  disease   . Kidney lump 04/04/2010   Overview:  Renal Cell Carcinoma   . 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 Moderate aortic insufficiency    a. 12/2015 Echo: Mod AI.  [name] Paroxysmal atrial fibrillation ([place])    a. 12/2015 started on Xarelto (CHA2DS2VASc = 4-5).  . Peripheral vascular disease ([place])   . Pneumonia   . Prostate cancer ([place])   . Renal cell carcinoma ([place])   . Sleep apnea     Does not like  CPAP  . Spinal stenosis in cervical region 10/12/2016  . Venous insufficiency     Past Surgical History:  Procedure Laterality Date  . AV FISTULA PLACEMENT Left 01/26/2020   Procedure: left ARTERIOVENOUS (AV) FISTULA CREATION;  Surgeon: Waynetta Sandy, MD;  Location: [facility];  Service: Vascular;  Laterality: Left;  . BACK SURGERY    . BASCILIC VEIN TRANSPOSITION Left 04/28/2020   Procedure: LEFT ARM SECOND STAGE BASILIC VEIN TRANSPOSITION;  Surgeon: Waynetta Sandy, MD;  Location: [facility];  Service: Vascular;  Laterality: Left;  . CARDIAC CATHETERIZATION N/A 12/18/2015   Procedure: Left Heart Cath and Coronary Angiography;  Surgeon: Leonie Man, MD;  Location: [facility];  Service: Cardiovascular;  Laterality: N/A;  . DIAGNOSTIC LAPAROSCOPY     partial colectomy  . IR FLUORO GUIDE CV LINE RIGHT  06/28/2019  . IR FLUORO GUIDE CV LINE RIGHT  01/24/2020  . IR REMOVAL TUN CV CATH W/O FL  07/26/2019  . IR US GUIDE VASC ACCESS RIGHT  06/28/2019  . IR US GUIDE VASC ACCESS RIGHT  01/24/2020  . LAPAROSCOPIC PARTIAL COLECTOMY Right 05/21/2018   Procedure: LAPAROSCOPIC RIGHT  COLECTOMY ERAS PATHWAY;  Surgeon: Leighton Ruff, MD;  Location: [facility];  Service: General;  Laterality: Right;  . [place]   "replaced a disc"     reports that he quit smoking about 42 years ago. His smoking use included cigars. He has never used smokeless tobacco. He reports previous alcohol use. He reports that he does not use drugs.  Allergies  Allergen Reactions  . Amoxicillin Other (See Comments)    Tolerates Unasyn. Can't move Has patient had a PCN reaction causing immediate rash, facial/tongue/throat swelling, SOB or lightheadedness with hypotension: No Has patient had a PCN reaction causing severe rash involving mucus membranes or skin necrosis: No Has patient had a PCN reaction that required hospitalization No Has patient had a PCN reaction occurring within  the last 10 years: No If all of the above answers are "NO", then may proceed with Cephalosporin use.   [name] Penicillins Other (See Comments)    Tolerates Unasyn. Can't move/dizziness Has patient had a PCN reaction causing immediate rash, facial/tongue/throat swelling, SOB or lightheadedness with hypotension: No Has patient had a PCN reaction causing severe rash involving mucus membranes or skin necrosis: No Has patient had a PCN reaction that required hospitalization No Has patient had a PCN reaction occurring within the last 10 years: No If all of the above answers are "NO", then may proceed with Cephalosporin use.      Family History  Problem Relation Age of Onset  . Emphysema Father   . Asthma Father   . Liver disease Father        tumor  . Heart disease Mother   . Hypertension Mother   . Asthma Sister   . Hypertension Son     Prior to Admission medications   Medication Sig Start Date End  Date Taking? Authorizing Provider  amLODipine (NORVASC) 5 MG tablet Take 1 tablet (5 mg total) by mouth daily. 05/02/20   Hosie Poisson, MD  atorvastatin (LIPITOR) 40 MG tablet Take 40 mg by mouth daily as needed (pt states he only takes when his cholesterol is high).     [provider]  AURYXIA 1 GM 210 MG(Fe) tablet Take 420 mg by mouth 3 (three) times daily. 03/23/20   [provider]  cetirizine (ZYRTEC) 10 MG tablet TAKE 1 TABLET (10 MG TOTAL) BY MOUTH DAILY AS NEEDED (SEASONAL ALLERGIES). Patient taking differently: Take 10 mg by mouth daily as needed for allergies (seasonal allergies).  11/12/19   [name], DO  Darbepoetin Alfa (ARANESP) 200 MCG/0.4ML SOSY injection Inject 0.4 mLs (200 mcg total) into the vein every Thursday with hemodialysis. 05/04/20   Hosie Poisson, MD  glucose blood (ONETOUCH ULTRA) test strip Check FSBS twice a day. Dx: E11.21, N18.32 Patient taking differently: 1 each by Other route See admin instructions. Check FSBS twice a day. Dx:  E11.21, N18.32 08/16/19   Fulp, Ander Gaster, MD  ixazomib citrate (NINLARO) 3 MG capsule Take 1 capsule (3 mg) by mouth weekly, 3 weeks on, 1 week off, repeat every 4 weeks. Take on an empty stomach 1hr before or 2hr after meals. Patient taking differently: Take 3 mg by mouth See admin instructions. Take 1 capsule (3 mg) by mouth weekly, 3 weeks on, 1 week off, repeat every 4 weeks. Take on an empty stomach 1hr before or 2hr after meals. 03/10/20   Brunetta Genera, MD  melatonin 3 MG TABS tablet Take 1 tablet (3 mg total) by mouth at bedtime. 05/02/20   Hosie Poisson, MD  senna-docusate (SENOKOT-S) 8.6-50 MG tablet Take 1 tablet by mouth at bedtime as needed for mild constipation. 05/02/20   Hosie Poisson, MD  tamsulosin (FLOMAX) 0.4 MG CAPS capsule Take 1 capsule (0.4 mg total) by mouth daily. 10/15/19   [name], DO    Physical Exam: Vitals:   05/09/20 0300 05/09/20 0830 05/09/20 0836 05/09/20 0915  BP: 121/80 (!) 123/91  (!) 131/94  Pulse: 71 72  65  Resp: 12 19  12   Temp: 97.8 F (36.6 C)     TempSrc: Oral     SpO2: 100% 100%  100%  Weight:   81 kg   Height:   5' 9"  (1.753 m)     Constitutional: NAD, calm, comfortable, on 2 L of oxygen via nasal cannula, communicating well, sleepy Eyes: PERRL, lids and conjunctivae normal ENMT: Mucous membranes are moist. Posterior pharynx clear of any exudate or lesions.Normal dentition.  Neck: normal, supple, no masses, no thyromegaly Respiratory: clear to auscultation bilaterally, no wheezing, no crackles. Normal respiratory effort. No accessory muscle use.  Cardiovascular: Regular rate and rhythm, no murmurs / rubs / gallops.  Bilateral 2+ pitting edema positive. 2+ pedal pulses. No carotid bruits.  Abdomen: no tenderness, no masses palpated. No hepatosplenomegaly. Bowel sounds positive.  Musculoskeletal: no clubbing / cyanosis. No joint deformity upper and lower extremities. Good ROM, no contractures. Normal muscle tone.  Skin: no  rashes, lesions, ulcers. No induration Neurologic: CN 2-12 grossly intact. Sensation intact, DTR normal. Strength 5/5 in all 4.  Psychiatric: Normal judgment and insight. Alert and oriented x 3. Normal mood.    Labs on Admission: I have personally reviewed following labs and imaging studies  CBC: Recent Labs  Lab 05/04/20 1653 05/04/20 1729 05/06/20 0334 05/09/20 0824  WBC 7.0  --  8.1 6.4  NEUTROABS 5.9  --  6.4 5.1  HGB 7.2* 7.8* 7.3* 7.6*  HCT 24.6* 23.0* 25.1* 25.3*  MCV 107.0*  --  109.1* 109.1*  PLT 165  --  173 007*   Basic Metabolic Panel: Recent Labs  Lab 05/04/20 1653 05/04/20 1729 05/06/20 0334 05/09/20 0824  NA 132* 132* 133* 130*  K 4.1 4.1 4.6 4.3  CL 93* 93* 93* 90*  CO2 25  --  24 26  GLUCOSE 111* 111* 108* 93  BUN 15 14 25* 29*  CREATININE 4.38* 4.40* 6.66* 7.64*  CALCIUM 8.9  --  9.8 9.6   GFR: Estimated Creatinine Clearance: 8.2 mL/min (A) (by C-G formula based on SCr of 7.64 mg/dL (H)). Liver Function Tests: Recent Labs  Lab 05/04/20 1653 05/06/20 0334 05/09/20 0824  AST 25 26 28   ALT 7 8 8   ALKPHOS 54 56 56  BILITOT 0.9 0.8 0.9  PROT 5.8* 5.9* 5.6*  ALBUMIN 2.8* 2.8* 2.9*   No results for input(s): LIPASE, AMYLASE in the last 168 hours. No results for input(s): AMMONIA in the last 168 hours. Coagulation Profile: No results for input(s): INR, PROTIME in the last 168 hours. Cardiac Enzymes: No results for input(s): CKTOTAL, CKMB, CKMBINDEX, TROPONINI in the last 168 hours. BNP (last 3 results) No results for input(s): PROBNP in the last 8760 hours. HbA1C: No results for input(s): HGBA1C in the last 72 hours. CBG: Recent Labs  Lab 05/06/20 0420  GLUCAP 99   Lipid Profile: No results for input(s): CHOL, HDL, LDLCALC, TRIG, CHOLHDL, LDLDIRECT in the last 72 hours. Thyroid Function Tests: No results for input(s): TSH, T4TOTAL, FREET4, T3FREE, THYROIDAB in the last 72 hours. Anemia Panel: No results for input(s): VITAMINB12,  FOLATE, FERRITIN, TIBC, IRON, RETICCTPCT in the last 72 hours. Urine analysis:    Component Value Date/Time   COLORURINE STRAW (A) 02/25/2020 2318   APPEARANCEUR CLEAR 02/25/2020 2318   LABSPEC 1.006 02/25/2020 2318   PHURINE 8.0 02/25/2020 2318   GLUCOSEU NEGATIVE 02/25/2020 2318   HGBUR MODERATE (A) 02/25/2020 2318   [place] NEGATIVE 02/25/2020 2318   [place] 02/25/2020 2318   PROTEINUR 100 (A) 02/25/2020 2318   NITRITE NEGATIVE 02/25/2020 2318   LEUKOCYTESUR NEGATIVE 02/25/2020 2318    Radiological Exams on Admission: DG Chest Portable 1 View  Result Date: 05/09/2020 CLINICAL DATA:  Syncopal episode at dialysis. EXAM: PORTABLE CHEST 1 VIEW COMPARISON:  05/04/2020 FINDINGS: Right Port-A-Cath tip at low SVC. Midline trachea. Moderate cardiomegaly. No right and no definite left pleural effusion. No pneumothorax. No congestive failure. Left lung base poorly evaluated secondary to AP portable technique and cardiomegaly. Otherwise, the lungs are clear. IMPRESSION: Cardiomegaly without congestive failure. Suboptimal evaluation of the left lung base and left pleural space. Electronically Signed   By: Abigail Miyamoto M.D.   On: 05/09/2020 10:49    EKG: Independently reviewed.  Sinus rhythm, atrial premature complexes, incomplete RBBB and LAFB.  Assessment/Plan Principal Problem:   Syncope Active Problems:   Essential (primary) hypertension   BPH (benign prostatic hyperplasia)   Diabetes (HCC)   Paroxysmal atrial fibrillation (HCC)   Light chain myeloma (HCC)   CAD (coronary artery disease)   Chronic diastolic CHF (congestive heart failure) (HCC)   Hyperlipidemia   ESRD (end stage renal disease) (HCC)   Hyponatremia    Recurrent syncope: -Recent admission and multiple ED visits with similar symptoms.  Reviewed transthoracic echo and carotid duplex ultrasound result.  Patient's vital signs stable.  Afebrile with  no leukocytosis. -Could be secondary to symptomatic anemia  versus vasovagal?.  Reviewed chest x-ray.  COVID-19 negative. -Admit patient on the floor.  On telemetry. -Check orthostatic vitals. -Check electrolytes. -Consult PT/OT -On fall precautions -We will consult palliative care for goals of care discussion  Anemia of chronic disease/symptomatic anemia: -Patient's H&H 7.6/25.3, MCV: 109.1 -Check iron studies, B12, folate and TSH. -EDP ordered 1 unit PRBC.  Monitor H&H closely and transfuse as needed.  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  Missed dialysis session this morning. -Potassium: WNL.  Reviewed EKG. -Nephrology consulted  Hyponatremia: Sodium of 130.  Chronic -Repeat BMP tomorrow a.m.    Type 2 diabetes mellitus: -Start patient on very sensitive sliding scale insulin.  Thrombocytopenia: Platelet 121 -No signs of active bleeding.  Repeat CBC tomorrow a.m.  Hyperlipidemia: Continue statin  Chronic hypoxemic respiratory failure secondary to underlying COPD: At baseline.  No wheezing noted on exam.  Reviewed chest x-ray.  COVID-19 negative. -On continuous pulse ox.  Light chain myeloma: Followed up with Dr. Velvet Bathe outpatient.  Renal cell carcinoma: -Reviewed DC summary-patient underwent PET/CT scan with new areas of bony uptake particularly in the left proximal humerus and right iliac bone suspicious for myeloma versus renal carcinoma with mets.  Oncology recommended urology follow-up however patient refused to follow-up in the past.  Chronic diastolic CHF: Reviewed recent echo.  Fluid management by nephrology.  Chest x-ray negative for acute findings.  Patient has chronic bilateral leg swelling.  BPH: Continue Flomax  Please note: Due to recurrent syncopal episodes-continue dialysis is not safe for the patient.  Discussed with nephrology.  No further dialysis.   Recommend transition to hospice care.  DVT prophylaxis: Heparin subcu/TED Code Status: Full code-confirmed with the patient Family Communication: None present at bedside.  Plan of care discussed with patient in length and he verbalized understanding and agreed with it.  I talked to patient's son Mr. Shawn on this [phone] and discussed about the CODE STATUS.  He tells me that he would like to discuss this with his siblings and will let us know.  He agreed with hospice care involvement.  Disposition Plan: Likely nursing home in 1 to 2 days Consults called: Nephrology and palliative care Admission status: Inpatient   Mckinley Jewel MD [facility]  If 7PM-7AM, please contact night-coverage [email or web address] Password [email or web address]  05/09/2020, 1:18 PM

## 2020-05-09 NOTE — ED Provider Notes (Signed)
[facility] EMERGENCY DEPARTMENT Provider Note   CSN: [id number] Arrival date & time: 05/09/20  6294     History Chief Complaint  Patient presents with  . Loss of Consciousness    [name] [name] is a 74 y.o. male.  HPI Patient is a 74 year old male with a history of anemia, CKD on HD Tuesday/Thursday/Saturday, chronic diastolic CHF, COPD, hypertension, multiple myeloma, atrial fibrillation, who presents to the emergency department due to a syncopal episode.  Patient was admitted for syncope on August 24 and ultimately discharged on August 31.  Concern for recurrent syncope secondary to fluid shift postdialysis versus vasovagal episodes.  Cardiology consulted during this admission who recommended TED hoses as well as holding Norvasc on HD days.  Echocardiogram of prior admission showed EF of 55-60 with mild LVH.  No PE.  Patient states that this morning he was being transferred to his wheelchair to attend his HD session.  He states that he "blacked out".  Nursing note states that patient lost consciousness for about 30 seconds.  He was then brought to the emergency department for further evaluation. Pt states that he is feeling weak and fatigued but otherwise is having no acute complaints at this time. he notes that yesterday he was experiencing recurrent diarrhea but denies any blood in his stools. No abdominal pain or vomiting. He states that "I probably need a transfusion because my levels are low". He resides at [facility] assisted living.  Pt notes that he very rarely makes urine. His last dialysis session was 3 days ago. He denies fevers, chills, chest pain, acute shortness of breath, abdominal pain, vomiting, nausea.     Past Medical History:  Diagnosis Date  . Abnormal CT of liver    a. nodular contour suggesting cirrhosis 05/2018.  [name] Abnormal LFTs (liver function tests)   . Anemia   . Anxiety   . Aortic root dilatation ([place])   . Ascending aortic aneurysm ([place])    . BPH with urinary obstruction   . Bradycardia   . 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 CAD (coronary artery disease)    a. 12/2015 NSTEMI/Cath (in setting of PAF):  LM nl, LAD 30p,  LCX 13m RCA  ok, AM 100%, RPDA1 40, RPDA 2 60 ->Med Rx.  . Childhood asthma   . Chronic diastolic CHF (congestive heart failure) (HNorth Sar[place]    a. 12/2015 Echo: EF 50-55%, mod AI, mod Ao root dil, mild MR, mod dil LA, mod RA.  . CKD (chronic kidney disease), stage III   . Colon cancer ([place]   . COPD (chronic obstructive pulmonary disease) (HRennert    pt denies at preop  . Diabetes mellitus type II    diet controlled  . Dysrhythmia   . History of syncope   . Hyperlipidemia   . Hypertension   . Hypertensive heart disease   . Kidney lump 04/04/2010   Overview:  Renal Cell Carcinoma   . Light chain myeloma (HClearfield   . Moderate aortic insufficiency    a. 12/2015 Echo: Mod AI.  .Marland KitchenParoxysmal atrial fibrillation (HTemple    a. 12/2015 started on Xarelto (CHA2DS2VASc = 4-5).  . Peripheral vascular disease (HEff[place]   . Pneumonia   . Prostate cancer ([place]boygan Falls   . Renal cell carcinoma (HRobins   . Sleep apnea    Does not like  CPAP  . Spinal stenosis in cervical region 10/12/2016  .  Venous insufficiency     Patient Active Problem List   Diagnosis Date Noted  . Generalized weakness 04/26/2020  . Hyperkalemia 04/26/2020  . Hyponatremia 04/26/2020  . Prolonged QT interval 04/26/2020  . Aortic root enlargement ([place]) 02/24/2020  . Symptomatic anemia 02/04/2020  . ESRD (end stage renal disease) ([place]) 01/27/2020  . Goals of care, counseling/discussion   . Palliative care by specialist   . DNR (do not resuscitate) discussion   . ARF (acute renal failure) ([place]) 01/22/2020  . Ascending aortic aneurysm ([place]) 08/04/2019  . 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 CKD (chronic kidney disease), stage III 06/04/2019  . Renal cell carcinoma ([place]) 06/04/2019  . Gross  hematuria 06/04/2019  . COPD exacerbation ([place]) 11/05/2018  . Palpitations 10/22/2018  . Counseling regarding advance care planning and goals of care 06/22/2018  . AKI (acute kidney injury) ([place])   . Anemia   . Sepsis ([place]) 06/05/2018  . Acute lower UTI 06/05/2018  . Colon polyp 05/21/2018  . Aortic valve regurgitation 04/14/2018  . Medication management 04/14/2018  . Thoracic aortic aneurysm without rupture ([place]) 09/29/2017  . 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 Head trauma, subsequent encounter 10/12/2016  . 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 Spinal stenosis in cervical region 10/12/2016  . Cervical spondylosis 10/12/2016  . Syncope and collapse 10/12/2016  . Syncope 10/11/2016  . CAD (coronary artery disease)   . Chronic diastolic CHF (congestive heart failure) ([place])   . Hyperlipidemia   . Hypertensive heart disease   . Paroxysmal atrial fibrillation ([place]) 12/17/2015  . Light chain myeloma ([place]) 12/17/2015  . Mild intermittent asthma 10/18/2015  . Aortic root dilatation ([place]) 10/17/2015  . Diabetes ([place]) 10/17/2015  . 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 Hand paresthesia 12/09/2014  . Elevated prostate specific antigen (PSA) 07/12/2014  . Allergic rhinitis 07/12/2014  . LBP (low back pain) 07/12/2014  . Palpitation 07/11/2014  . Patient's other noncompliance with medication regimen 08/10/2013  . Chronic venous insufficiency 07/23/2012  . Obstructive apnea 04/11/2011  . Essential (primary) hypertension 09/07/2010  . 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 Anxiety, generalized 04/04/2010  . Cardiac conduction disorder 02/27/2010  . BPH (benign prostatic hyperplasia) 09/13/2009  . Abnormal findings on examination of genitourinary organs 08/09/2009  . Hereditary and idiopathic neuropathy 07/05/2009    Past Surgical  History:  Procedure Laterality Date  . AV FISTULA PLACEMENT Left 01/26/2020   Procedure: left ARTERIOVENOUS (AV) FISTULA CREATION;  Surgeon: Waynetta Sandy, MD;  Location: [facility];  Service: Vascular;  Laterality: Left;  . BACK SURGERY    . BASCILIC VEIN TRANSPOSITION Left 04/28/2020   Procedure: LEFT ARM SECOND STAGE BASILIC VEIN TRANSPOSITION;  Surgeon: Waynetta Sandy, MD;  Location: [facility];  Service: Vascular;  Laterality: Left;  . CARDIAC CATHETERIZATION N/A 12/18/2015   Procedure: Left Heart Cath and Coronary Angiography;  Surgeon: Leonie Man, MD;  Location: [facility];  Service: Cardiovascular;  Laterality: N/A;  . DIAGNOSTIC LAPAROSCOPY     partial colectomy  . IR FLUORO GUIDE CV LINE RIGHT  06/28/2019  . IR FLUORO GUIDE CV LINE RIGHT  01/24/2020  . IR REMOVAL TUN CV CATH W/O FL  07/26/2019  . IR US GUIDE VASC ACCESS RIGHT  06/28/2019  . IR US GUIDE VASC ACCESS RIGHT  01/24/2020  . LAPAROSCOPIC PARTIAL COLECTOMY Right 05/21/2018   Procedure:  LAPAROSCOPIC RIGHT  COLECTOMY ERAS PATHWAY;  Surgeon: Leighton Ruff, MD;  Location: [facility];  Service: General;  Laterality: Right;  . [place]   "replaced a disc"       Family History  Problem Relation Age of Onset  . Emphysema Father   . Asthma Father   . Liver disease Father        tumor  . Heart disease Mother   . Hypertension Mother   . Asthma Sister   . Hypertension Son     Social History   Tobacco Use  . Smoking status: Former Smoker    Types: Cigars    Quit date: 09/02/1977    Years since quitting: 42.7  . Smokeless tobacco: Never Used  . Tobacco comment:    Vaping Use  . Vaping Use: Never used  Substance Use Topics  . Alcohol use: Not Currently    Comment: "stopped drinking alcohol in ~ 1980; just drank a little on the weekends when I did drink"  . Drug use: No    Home Medications Prior to Admission medications   Medication Sig Start Date End Date Taking? Authorizing  Provider  amLODipine (NORVASC) 5 MG tablet Take 1 tablet (5 mg total) by mouth daily. 05/02/20   Hosie Poisson, MD  atorvastatin (LIPITOR) 40 MG tablet Take 40 mg by mouth daily as needed (pt states he only takes when his cholesterol is high).     [provider]  AURYXIA 1 GM 210 MG(Fe) tablet Take 420 mg by mouth 3 (three) times daily. 03/23/20   [provider]  cetirizine (ZYRTEC) 10 MG tablet TAKE 1 TABLET (10 MG TOTAL) BY MOUTH DAILY AS NEEDED (SEASONAL ALLERGIES). Patient taking differently: Take 10 mg by mouth daily as needed for allergies (seasonal allergies).  11/12/19   [name], DO  Darbepoetin Alfa (ARANESP) 200 MCG/0.4ML SOSY injection Inject 0.4 mLs (200 mcg total) into the vein every Thursday with hemodialysis. 05/04/20   Hosie Poisson, MD  glucose blood (ONETOUCH ULTRA) test strip Check FSBS twice a day. Dx: E11.21, N18.32 Patient taking differently: 1 each by Other route See admin instructions. Check FSBS twice a day. Dx: E11.21, N18.32 08/16/19   Fulp, Ander Gaster, MD  ixazomib citrate (NINLARO) 3 MG capsule Take 1 capsule (3 mg) by mouth weekly, 3 weeks on, 1 week off, repeat every 4 weeks. Take on an empty stomach 1hr before or 2hr after meals. Patient taking differently: Take 3 mg by mouth See admin instructions. Take 1 capsule (3 mg) by mouth weekly, 3 weeks on, 1 week off, repeat every 4 weeks. Take on an empty stomach 1hr before or 2hr after meals. 03/10/20   Brunetta Genera, MD  melatonin 3 MG TABS tablet Take 1 tablet (3 mg total) by mouth at bedtime. 05/02/20   Hosie Poisson, MD  senna-docusate (SENOKOT-S) 8.6-50 MG tablet Take 1 tablet by mouth at bedtime as needed for mild constipation. 05/02/20   Hosie Poisson, MD  tamsulosin (FLOMAX) 0.4 MG CAPS capsule Take 1 capsule (0.4 mg total) by mouth daily. 10/15/19   [name], DO    Allergies    Amoxicillin and Penicillins  Review of Systems   Review of Systems  All other systems  reviewed and are negative. Ten systems reviewed and are negative for acute change, except as noted in the HPI.    Physical Exam Updated Vital Signs BP 121/80 (BP Location: Right Arm)   Pulse 71  Temp 97.8 F (36.6 C) (Oral)   Resp 12   SpO2 100%   Physical Exam Vitals and nursing note reviewed.  Constitutional:      General: He is not in acute distress.    Appearance: Normal appearance. He is not ill-appearing, toxic-appearing or diaphoretic.  HENT:     Head: Normocephalic and atraumatic.     Right Ear: External ear normal.     Left Ear: External ear normal.     Nose: Nose normal.     Mouth/Throat:     Mouth: Mucous membranes are moist.     Pharynx: Oropharynx is clear. No oropharyngeal exudate or posterior oropharyngeal erythema.  Eyes:     General: No scleral icterus.       Right eye: No discharge.        Left eye: No discharge.     Extraocular Movements: Extraocular movements intact.     Conjunctiva/sclera: Conjunctivae normal.  Cardiovascular:     Rate and Rhythm: Normal rate and regular rhythm.     Pulses: Normal pulses.     Heart sounds: Normal heart sounds. No murmur heard.  No friction rub. No gallop.      Comments: Regular rate and rhythm.  No murmurs, rubs, or gallops. Pulmonary:     Effort: Pulmonary effort is normal. No respiratory distress.     Breath sounds: Normal breath sounds. No stridor. No wheezing, rhonchi or rales.     Comments: Lungs are clear to auscultation bilaterally. Abdominal:     General: Abdomen is flat.     Palpations: Abdomen is soft.     Tenderness: There is no abdominal tenderness.  Musculoskeletal:        General: Normal range of motion.     Cervical back: Normal range of motion and neck supple. No tenderness.     Right lower leg: No edema.     Left lower leg: No edema.     Comments: No pedal edema.  Palpable pedal pulses noted bilaterally.  Skin:    General: Skin is warm and dry.  Neurological:     General: No focal deficit  present.     Mental Status: He is alert and oriented to person, place, and time.  Psychiatric:        Mood and Affect: Mood normal.        Behavior: Behavior normal.    ED Results / Procedures / Treatments   Labs (all labs ordered are listed, but only abnormal results are displayed) Labs Reviewed  COMPREHENSIVE METABOLIC PANEL - Abnormal; Notable for the following components:      Result Value   Sodium 130 (*)    Chloride 90 (*)    BUN 29 (*)    Creatinine, Ser 7.64 (*)    Total Protein 5.6 (*)    Albumin 2.9 (*)    GFR calc non Af Amer 6 (*)    GFR calc Af Amer 7 (*)    All other components within normal limits  CBC WITH DIFFERENTIAL/PLATELET - Abnormal; Notable for the following components:   RBC 2.32 (*)    Hemoglobin 7.6 (*)    HCT 25.3 (*)    MCV 109.1 (*)    RDW 22.5 (*)    Platelets 121 (*)    nRBC 0.3 (*)    All other components within normal limits  SARS CORONAVIRUS 2 BY RT PCR (HOSPITAL ORDER, [facility])  TYPE AND SCREEN   EKG None  Radiology DG Chest Portable 1 View  Result Date: 05/09/2020 CLINICAL DATA:  Syncopal episode at dialysis. EXAM: PORTABLE CHEST 1 VIEW COMPARISON:  05/04/2020 FINDINGS: Right Port-A-Cath tip at low SVC. Midline trachea. Moderate cardiomegaly. No right and no definite left pleural effusion. No pneumothorax. No congestive failure. Left lung base poorly evaluated secondary to AP portable technique and cardiomegaly. Otherwise, the lungs are clear. IMPRESSION: Cardiomegaly without congestive failure. Suboptimal evaluation of the left lung base and left pleural space. Electronically Signed   By: Abigail Miyamoto M.D.   On: 05/09/2020 10:49    Procedures Procedures   Medications Ordered in ED Medications  LORazepam (ATIVAN) tablet 0.5 mg (0.5 mg Oral Given 05/09/20 1044)    ED Course  I have reviewed the triage vital signs and the nursing notes.  Pertinent labs & imaging results that were available during my  care of the patient were reviewed by me and considered in my medical decision making (see chart for details).  Clinical Course as of May 09 1230  Tue May 09, 2020  1109 Similar to baseline values.  Hemoglobin(!): 7.6 [LJ]  1109 SARS Coronavirus 2: NEGATIVE [LJ]  1154 Elevated from 6.66, 3 days ago.  Patient scheduled for HD today.  Creatinine(!): 7.64 [LJ]  1155 Similar to baseline values.  Sodium(!): 130 [LJ]    Clinical Course User Index [LJ] Rayna Sexton, PA-C   MDM Rules/Calculators/A&P                          Pt is a 74 y.o. male that presents with a history, physical exam, and ED Clinical Course as noted above.   Patient presents today due to a syncopal episode that occurred this morning prior to receiving hemodialysis.  Patient was admitted for this recently and discharged about a week ago.  He has been seen in the emergency department multiple times since his recent discharge for similar episodes of syncope.  I obtained basic labs on the patient which all appear to be similar to baseline.  COVID-19 test is negative.  No acute abnormalities on chest x-ray.  Given the patient's recurrent symptoms and his medical history, patient was discussed with and evaluated by my attending physician.  My attending physician further discussed patient with the hospitalist team.  They feel that admission is warranted for symptomatic anemia.  Will type and screen the patient for likely transfusion.  Hospitalist team is planning on getting hospice involved during this patient's admission as well.  This was discussed with the patient.  Note: Portions of this report may have been transcribed using voice recognition software. Every effort was made to ensure accuracy; however, inadvertent computerized transcription errors may be present.   Final Clinical Impression(s) / ED Diagnoses Final diagnoses:  Symptomatic anemia  Syncope, unspecified syncope type    Rx / DC Orders ED Discharge Orders     None       Rayna Sexton, PA-C 05/09/20 1232    Gareth Morgan, MD 05/09/20 2258

## 2020-05-09 NOTE — ED Notes (Signed)
Pt very anxious to return to nursing facility. MD at bedside.

## 2020-05-09 NOTE — ED Notes (Signed)
Call son Raquel Sarna at [phone] with an update

## 2020-05-09 NOTE — ED Triage Notes (Signed)
Pt bib EMS from dialysis center with c/o LOC for about 30 seconds when transferring from [facility] to chair. Currently resides at [facility]. 2-4L [place] chronically. Pt endorses diarrhea following laxative admin yesterday at facility. A&O x4, denies pain, VSS.   EMS v/s: 128/80 20 RR 100% 2L [place] 115 CBG 97.7 temporal

## 2020-05-09 NOTE — Progress Notes (Addendum)
Notified MD Pahwani, via secure chat, that pt had 1 unit of PRBCs ordered and that pt will not be having HD per nephrology. Concern for FVO. MD Pahwani stated to hold off on 1 unit of packed RBCs and to continue heparin subq for DVT prophylaxis.   Paulla Fore, RN, BSN

## 2020-05-09 NOTE — ED Notes (Signed)
Pt sitting up eating lunch tray

## 2020-05-09 NOTE — Progress Notes (Signed)
Manufacturing engineer [facility]) Community Based Palliative Care  This patient is enrolled in our palliative care services in the community. [facility] will continue to follow for any discharge planning needs and to coordinate continuation of palliative care.  If you have questions or need assistance, please call [phone] or contact the hospital Liaison listed on AMION.   Gar Ponto, RN [place]) [phone]

## 2020-05-10 DIAGNOSIS — R55 Syncope and collapse: Secondary | ICD-10-CM

## 2020-05-10 DIAGNOSIS — N186 End stage renal disease: Secondary | ICD-10-CM

## 2020-05-10 DIAGNOSIS — Z7189 Other specified counseling: Secondary | ICD-10-CM

## 2020-05-10 DIAGNOSIS — I5032 Chronic diastolic (congestive) heart failure: Secondary | ICD-10-CM

## 2020-05-10 LAB — COMPREHENSIVE METABOLIC PANEL
ALT: 8 U/L (ref 0–44)
AST: 23 U/L (ref 15–41)
Albumin: 2.6 g/dL — ABNORMAL LOW (ref 3.5–5.0)
Alkaline Phosphatase: 51 U/L (ref 38–126)
Anion gap: 17 — ABNORMAL HIGH (ref 5–15)
BUN: 38 mg/dL — ABNORMAL HIGH (ref 8–23)
CO2: 21 mmol/L — ABNORMAL LOW (ref 22–32)
Calcium: 9.6 mg/dL (ref 8.9–10.3)
Chloride: 87 mmol/L — ABNORMAL LOW (ref 98–111)
Creatinine, Ser: 8.53 mg/dL — ABNORMAL HIGH (ref 0.61–1.24)
GFR calc Af Amer: 6 mL/min — ABNORMAL LOW (ref >60)
GFR calc non Af Amer: 5 mL/min — ABNORMAL LOW (ref >60)
Glucose, Bld: 99 mg/dL (ref 70–99)
Potassium: 4.5 mmol/L (ref 3.5–5.1)
Sodium: 125 mmol/L — ABNORMAL LOW (ref 135–145)
Total Bilirubin: 0.9 mg/dL (ref 0.3–1.2)
Total Protein: 5.3 g/dL — ABNORMAL LOW (ref 6.5–8.1)

## 2020-05-10 LAB — CBC
HCT: 25.5 % — ABNORMAL LOW (ref 39.0–52.0)
Hemoglobin: 7.9 g/dL — ABNORMAL LOW (ref 13.0–17.0)
MCH: 33.1 pg (ref 26.0–34.0)
MCHC: 31 g/dL (ref 30.0–36.0)
MCV: 106.7 fL — ABNORMAL HIGH (ref 80.0–100.0)
Platelets: 165 10*3/uL (ref 150–400)
RBC: 2.39 MIL/uL — ABNORMAL LOW (ref 4.22–5.81)
RDW: 22.5 % — ABNORMAL HIGH (ref 11.5–15.5)
WBC: 6.2 10*3/uL (ref 4.0–10.5)
nRBC: 0.5 % — ABNORMAL HIGH (ref 0.0–0.2)

## 2020-05-10 LAB — PATHOLOGIST SMEAR REVIEW

## 2020-05-10 LAB — GLUCOSE, CAPILLARY: Glucose-Capillary: 79 mg/dL (ref 70–99)

## 2020-05-10 MED ORDER — OXYCODONE HCL 5 MG PO TABS
5.0000 mg | ORAL_TABLET | Freq: Four times a day (QID) | ORAL | 0 refills | Status: AC | PRN
Start: 2020-05-10 — End: ?

## 2020-05-10 MED ORDER — CHLORHEXIDINE GLUCONATE CLOTH 2 % EX PADS: 6.0000 | MEDICATED_PAD | Freq: Every day | CUTANEOUS | Status: DC

## 2020-05-10 MED ORDER — LORAZEPAM 0.5 MG PO TABS
0.5000 mg | ORAL_TABLET | ORAL | Status: DC | PRN
  Administered 2020-05-10: 0.5 mg via ORAL
  Filled 2020-05-10: qty 1

## 2020-05-10 NOTE — TOC Initial Note (Signed)
Transition of Care [facility]) - Initial/Assessment Note    Patient Details  Name: [name] [name] MRN: [id number] Date of Birth: [birth year]  Transition of [facility]) CM/SW Contact:    Bartholomew Crews, RN Phone Number: [phone] 05/10/2020, 10:15 AM  Clinical Narrative:                  Patient to transition to [facility]. NCM reached out to [facility] hospice liaison to follow up referral. Clinical information has been sent to [facility] and is pending acceptance. Liaison to reach out to [facility] upon acceptance and bed availability. TOC following for transition needs.   Expected Discharge Plan: Hospice Medical Facility Barriers to Discharge: Other (comment) (pending hospice approval)   Patient Goals and CMS Choice   CMS Medicare.gov Compare Post Acute Care list provided to:: Patient Choice offered to / list presented to : Adult Children, Patient  Expected Discharge Plan and Services Expected Discharge Plan: [place]     Post Acute Care Choice: Hospice                                        Prior Living Arrangements/Services                       Activities of Daily Living Home Assistive Devices/Equipment: Blood pressure cuff, CBG Meter, Scales, Wheelchair ADL Screening (condition at time of admission) Patient's cognitive ability adequate to safely complete daily activities?: Yes Is the patient deaf or have difficulty hearing?: No Does the patient have difficulty seeing, even when wearing glasses/contacts?: Yes Does the patient have difficulty concentrating, remembering, or making decisions?: No Patient able to express need for assistance with ADLs?: Yes Does the patient have difficulty dressing or bathing?: Yes Independently performs ADLs?: No Communication: Independent Dressing (OT): Needs assistance Is this a change from baseline?: Pre-admission baseline Grooming: Independent Feeding: Independent Bathing: Needs assistance Is this a change from  baseline?: Pre-admission baseline Toileting: Needs assistance Is this a change from baseline?: Pre-admission baseline In/Out Bed: Needs assistance Is this a change from baseline?: Pre-admission baseline Walks in Home: Needs assistance Is this a change from baseline?: Pre-admission baseline Does the patient have difficulty walking or climbing stairs?: Yes Weakness of Legs: Both Weakness of Arms/Hands: Both  Permission Sought/Granted                  Emotional Assessment              Admission diagnosis:  Syncope [R55] Symptomatic anemia [D64.9] Syncope, unspecified syncope type [R55] Patient Active Problem List   Diagnosis Date Noted  . Encounter for hospice care discussion   . Generalized weakness 04/26/2020  . Hyperkalemia 04/26/2020  . Hyponatremia 04/26/2020  . Prolonged QT interval 04/26/2020  . Aortic root enlargement ([place]) 02/24/2020  . Symptomatic anemia 02/04/2020  . ESRD (end stage renal disease) ([place]) 01/27/2020  . Goals of care, counseling/discussion   . Palliative care by specialist   . DNR (do not resuscitate) discussion   . ARF (acute renal failure) ([place]) 01/22/2020  . Ascending aortic aneurysm ([place]) 08/04/2019  . Myeloma kidney ([place])   . CKD (chronic kidney disease), stage III 06/04/2019  . Renal cell carcinoma ([place]) 06/04/2019  . Gross hematuria 06/04/2019  . COPD exacerbation ([place]) 11/05/2018  . Palpitations 10/22/2018  . Counseling regarding advance care planning and goals of care 06/22/2018  .  AKI (acute kidney injury) ([place])   . Anemia   . Sepsis ([place]) 06/05/2018  . Acute lower UTI 06/05/2018  . Colon polyp 05/21/2018  . Aortic valve regurgitation 04/14/2018  . Medication management 04/14/2018  . Thoracic aortic aneurysm without rupture ([place]) 09/29/2017  . 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 Head trauma, subsequent encounter 10/12/2016  . Periodic  limb movement sleep disorder 10/12/2016  . Spinal stenosis in cervical region 10/12/2016  . Cervical spondylosis 10/12/2016  . Syncope and collapse 10/12/2016  . Syncope 10/11/2016  . CAD (coronary artery disease)   . Chronic diastolic CHF (congestive heart failure) ([place])   . Hyperlipidemia   . Hypertensive heart disease   . Paroxysmal atrial fibrillation ([place]) 12/17/2015  . Light chain myeloma ([place]) 12/17/2015  . Mild intermittent asthma 10/18/2015  . Aortic root dilatation ([place]) 10/17/2015  . Diabetes ([place]) 10/17/2015  . 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 Hand paresthesia 12/09/2014  . Elevated prostate specific antigen (PSA) 07/12/2014  . Allergic rhinitis 07/12/2014  . LBP (low back pain) 07/12/2014  . Palpitation 07/11/2014  . Patient's other noncompliance with medication regimen 08/10/2013  . Chronic venous insufficiency 07/23/2012  . Obstructive apnea 04/11/2011  . Essential (primary) hypertension 09/07/2010  . 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 Anxiety, generalized 04/04/2010  . Cardiac conduction disorder 02/27/2010  . BPH (benign prostatic hyperplasia) 09/13/2009  . Abnormal findings on examination of genitourinary organs 08/09/2009  . Hereditary and idiopathic neuropathy 07/05/2009   PCP:  [name], DO Pharmacy:   [id number] - [place], [place] [address] [place] [address] Phone: [phone] Fax: [phone]  [place], [place] [address] Phone: [phone] Fax: [phone]     Social Determinants of Health ([place]) Interventions    Readmission Risk Interventions Readmission Risk Prevention Plan 02/09/2020 06/29/2019  Transportation Screening Complete Complete  Medication Review (RN Care Manager) Referral to Pharmacy  Complete  PCP or Specialist appointment within 3-5 days of discharge Complete Complete  HRI or [place] Complete Complete  SW Recovery Care/Counseling Consult Complete Complete  Palliative Care Screening Not Applicable Not [place] Not Applicable Not Applicable  Some recent data might be hidden

## 2020-05-10 NOTE — TOC Transition Note (Signed)
Transition of Care [facility]) - CM/SW Discharge Note   Patient Details  Name: [name] [name] MRN: [id number] Date of Birth: [birth year]  Transition of Care [facility]) CM/SW Contact:  Bartholomew Crews, RN Phone Number: [phone] 05/10/2020, 12:15 PM   Clinical Narrative:     Notified by [facility] liaison. Consents have been signed for admission to [facility]. DC order/summary provided by MD. Nursing to call report to [facility] at [phone]. [facility] arranged for transport. Notified patient's son, Corrin Hingle, of transport arrangements. No further TOC needs identified.   Final next level of care: [place] Barriers to Discharge: No Barriers Identified   Patient Goals and CMS Choice   CMS Medicare.gov Compare Post Acute Care list provided to:: Patient Choice offered to / list presented to : Adult Children, Patient  Discharge Placement              Patient chooses bed at:  [facility]) Patient to be transferred to facility by: [place] Name of family member notified: [name] [phone] Patient and family notified of of transfer: 05/10/20  Discharge Plan and Services     Post Acute Care Choice: Hospice                               Social Determinants of Health (SDOH) Interventions     Readmission Risk Interventions Readmission Risk Prevention Plan 02/09/2020 06/29/2019  Transportation Screening Complete Complete  Medication Review (RN Care Manager) Referral to Pharmacy Complete  PCP or Specialist appointment within 3-5 days of discharge Complete Complete  HRI or [place] Complete Complete  SW Recovery Care/Counseling Consult Complete Complete  Palliative Care Screening Not Applicable Not [place] Not Applicable Not Applicable  Some recent data might be hidden

## 2020-05-10 NOTE — Progress Notes (Signed)
Patient discharged to [facility], report called to [place], [place]. PTAR provided transportation, AVS printed and placed in packet for the receiving facility

## 2020-05-10 NOTE — Progress Notes (Signed)
Palliative Medicine RN Note: Note that referral was written last night to [facility] for Mr Cisek by Gregary Signs, PMT NP. I reached out to [facility] liaison Bradd Canary, who will follow up, and I notified RNCM Abigail Butts.  Marjie Skiff Westlee Devita, RN, BSN, [facility] Palliative Medicine Team 05/10/2020 9:35 AM Office [phone]

## 2020-05-10 NOTE — Evaluation (Addendum)
Physical Therapy Evaluation Patient Details Name: [name] [name] MRN: [id number] DOB: [birth year] Today's Date: 05/10/2020   History of Present Illness  The pt is a 74 yo male presenting from home with SOB, generalized weakness, and recurrent syncope; medically fragile, and plan is to stop HD, and [facility]; PMH includes: ESRD (HD TTS), DM II, ascending aortic aneurysim, CAD, COPD, HTN, afib, light chain myeloma.  Clinical Impression   Pt admitted with above diagnosis. Comes from [place] [facility]; has been experiencing recurrent syncope, making HD unsafe; Plan of care is focused on Palliative Care, stopping HD, and transitioning to [facility]; Still, discussed pt case with his primary RN today, and she indicated that he has been wanting to try and get up and walk, so opted to proceed with PT evaluation; Presents with generalized weakness, and Orthostatic BP drop from supine to standing (unable to stand long enough for 3 minute standing BP); Mobility and function seem to be important to [name] [name], and perhaps PT can have a palliative role in his care;  Carefully monitored mobility can also help Mr. Ron with insight into his medical and physical status; Will follow as long as PT has a role in his care, and take Hospitalist and Palliative Care's lead;  Pt currently with functional limitations due to the deficits listed below (see PT Problem List). Pt will benefit from skilled PT to increase their independence and safety with mobility to allow discharge to the venue listed below.      05/10/20 0910 05/10/20 0915  Orthostatic Lying   BP- Lying 120/79  --   Pulse- Lying 75  --   Orthostatic Sitting  BP- Sitting 125/82  --   Pulse- Sitting 81  --   Orthostatic Standing at 0 minutes  BP- Standing at 0 minutes 114/66 98/69 (immediately after standing approx 1 minute)  Pulse- Standing at 0 minutes 89 83  Orthostatic Standing at 3 minutes  BP- Standing at 3 minutes  (Unable to  stand long enough)  --  Pulse- Standing at 3 minutes  (Unable to stand long enough)  --      I posit that his BP likely dropped lower than 98/69 during our attempt to stand to get a 3 minute reading, and that drop prompted him to request to sit back down    Follow Up Recommendations SNF ([facility])    Equipment Recommendations  Rolling walker with 5" wheels;3in1 (PT)    Recommendations for Other Services Other (comment) (Will follow Palliative Care/Hospice lead)     Precautions / Restrictions Precautions Precautions: Fall Precaution Comments: Watch Orthostatic BPs; Unable to obtain a 3 minute standing BP on Eval      Mobility  Bed Mobility Overal bed mobility: Needs Assistance Bed Mobility: Supine to Sit     Supine to sit: Min guard     General bed mobility comments: pt able to move BLE to EOB without assist, able to raise trunk to sitting EOB with use of bed rails; cues to square off hips  Transfers Overall transfer level: Needs assistance Equipment used: Rolling walker (2 wheeled) Transfers: Sit to/from Stand Sit to Stand: Min guard         General transfer comment: Cues for hand placement, and to self-monitor for activity tolerance; dependent on heavy forward weight shift and then pushing up on RW to get to stand; stood x2  Ambulation/Gait Ambulation/Gait assistance: Min guard Gait Distance (Feet):  (sidesteps towards [facility]) Assistive device: Rolling walker (2  wheeled) Gait Pattern/deviations: Shuffle     General Gait Details: Sidesteps towards [facility]; cues to self-monitor for activity tolerance  Stairs            Wheelchair Mobility    Modified Rankin (Stroke Patients Only)       Balance     Sitting balance-Leahy Scale: Fair       Standing balance-Leahy Scale: Poor                               Pertinent Vitals/Pain Pain Assessment: No/denies pain Pain Intervention(s): Monitored during session    Home Living  Family/patient expects to be discharged to:: Skilled nursing facility                 Additional Comments: Pt tells me he has been at [employer]    Prior Function Level of Independence: Needs assistance   Gait / Transfers Assistance Needed: pt reports he previously was using a SPC, but that he has been limited by syncope  ADL's / Homemaking Assistance Needed: pt reports he was previously independent with ADLs, but over last few weeks his son, and then SNF staff, was assisting with all tasks due to pt's syncope  Comments: was previously driving until 3 weeks ago, unable now due to syncope. pt reports 4-5 syncopal episodes in last week alone     Hand Dominance        Extremity/Trunk Assessment   Upper Extremity Assessment Upper Extremity Assessment: Generalized weakness    Lower Extremity Assessment Lower Extremity Assessment: Generalized weakness (grossly edematous)       Communication   Communication: No difficulties  Cognition Arousal/Alertness: Awake/alert Behavior During Therapy: WFL for tasks assessed/performed Overall Cognitive Status: No family/caregiver present to determine baseline cognitive functioning                                 General Comments: tangential speech, able to recount situation well, but very repetitive. cues for safety. poor critical thinking and insight to role of activity/mobility and medical trjectory despite continued education      General Comments General comments (skin integrity, edema, etc.): Session conducted on supplemenatl O2 3L;     Exercises     Assessment/Plan    PT Assessment Patient needs continued PT services  PT Problem List Decreased strength;Decreased mobility;Decreased range of motion;Decreased activity tolerance;Decreased balance;Cardiopulmonary status limiting activity       PT Treatment Interventions DME instruction;Gait training;Functional mobility training;Therapeutic activities;Therapeutic  exercise;Cognitive remediation;Patient/family education    PT Goals (Current goals can be found in the Care Plan section)  Acute Rehab PT Goals Patient Stated Goal: Clearly stating he wants to walk PT Goal Formulation: With patient Time For Goal Achievement: 05/24/20 Potential to Achieve Goals: Fair    Frequency Min 2X/week   Barriers to discharge        Co-evaluation               AM-PAC PT "6 Clicks" Mobility  Outcome Measure Help needed turning from your back to your side while in a flat bed without using bedrails?: None Help needed moving from lying on your back to sitting on the side of a flat bed without using bedrails?: None Help needed moving to and from a bed to a chair (including a wheelchair)?: A Little Help needed standing up from a chair using your arms (e.g., wheelchair  or bedside chair)?: A Little Help needed to walk in hospital room?: A Lot Help needed climbing 3-5 steps with a railing? : Total 6 Click Score: 17    End of Session Equipment Utilized During Treatment: Oxygen Activity Tolerance: Patient limited by fatigue Patient left: in bed;with call bell/phone within reach;with bed alarm set (Opting to sit EOB) Nurse Communication: Mobility status PT Visit Diagnosis: Muscle weakness (generalized) (M62.81);Difficulty in walking, not elsewhere classified (R26.2);History of falling (Z91.81)    Time: 9539-6728 PT Time Calculation (min) (ACUTE ONLY): 30 min   Charges:   PT Evaluation $PT Eval Moderate Complexity: 1 Mod PT Treatments $Therapeutic Activity: 8-22 mins        Roney Marion, PT  Acute Rehabilitation Services Pager [phone] Office [phone]   Colletta [place] 05/10/2020, 9:48 AM

## 2020-05-10 NOTE — Progress Notes (Signed)
This chaplain responded to PMT consult for spiritual care.  Upon arrival the chaplain checked in with Unit Sec.  The chaplain understands the Pt. is preparing for discharge.  The chaplain will pray privately for the Pt.

## 2020-05-10 NOTE — Discharge Summary (Signed)
Physician Discharge Summary  [name] [name] MWU:[id number] DOB: 20-Aug-1946 DOA: 05/09/2020  PCP: [name], DO  Admit date: 05/09/2020 Discharge date: 05/10/2020  Admitted From: home Discharge disposition: [facility]   Recommendations for Outpatient Follow-Up:   1. To [facility]   Discharge Diagnosis:   Principal Problem:   Syncope Active Problems:   Essential (primary) hypertension   BPH (benign prostatic hyperplasia)   Diabetes (HCC)   Paroxysmal atrial fibrillation (HCC)   Light chain myeloma (HCC)   CAD (coronary artery disease)   Chronic diastolic CHF (congestive heart failure) (HCC)   Hyperlipidemia   ESRD (end stage renal disease) ([place])   Hyponatremia   Encounter for hospice care discussion    Discharge Condition: terminal  Diet recommendation:   Regular.  Wound care: None.  Code status: DNR   History of Present Illness:   [name] [name] is a 74 y.o. 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 fib, chronic anemia, ascending aortic aneurysm, renal cell carcinoma, type 2 diabetes mellitus, hyponatremia, chronic diastolic CHF, hypertension, hyperlipidemia presents to emergency department for evaluation of syncope.  Patient tells me that this morning he was being transferred to his wheelchair to obtain his hemodialysis session where he blacked out for 30 seconds.  He denies headache, blurry vision, lightheadedness, dizziness prior or after the syncopal episode.  He denies head trauma, seizures, chest pain, shortness of breath, palpitation, orthopnea, PND, worsening leg swelling, fever, chills, nausea, vomiting, abdominal pain.  He tells me that he was given prune juice at nursing home yesterday and since then he had 4-5 loose watery stool, green in color, nonbloody, denies association with  abdominal pain, decreased appetite, weight loss.  He thinks that his passing out episodes is likely related to his low hemoglobin.  He tells me that he had similar symptoms in the past which improved after blood transfusion.  He denies current smoking, alcohol, illicit drug use.  Of note: Patient admitted recently and had ED visit with similar symptoms.  His work-up including transthoracic echo and carotid duplex ultrasound.  Cardiology was consulted recommended TED hose and hold Norvasc on hemodialysis days, there was concern for recurrent syncope likely secondary to fluid shifts postdialysis versus vasovagal episodes.   Hospital Course by Problem:   1. 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 Multiple myeloma - untreated, progressive 3. Anemia d/t myeloma/ esrd 4. ESRD -patient is not a candidate for further dialysis 5. Renal mass - +PET c/w renal cell cancer, untreated  Patient and family met with palliative care and plans are to transition to [facility] and stop hemodialysis.       Medical Consultants:   Palliative care nephrology   Discharge Exam:   Vitals:   05/10/20 0603 05/10/20 0809  BP: 125/88 112/73  Pulse: 69 79  Resp: 18 18  Temp: 97.8 F (36.6 C) 98 F (36.7 C)  SpO2: 98% 100%   Vitals:   05/09/20 1651 05/09/20 2055 05/10/20 0603 05/10/20 0809  BP: 117/84 117/81 125/88 112/73  Pulse: 72 73 69 79  Resp: _0 Temp: (!) 97.5 F (36.4 C) 97.7 F (36.5 C) 97.8 F (36.6 C) 98 F (36.7 C)  TempSrc: Oral Oral Oral Oral  SpO2: 98% 100% 98% 100%  Weight:  81 kg    Height:        General exam: Appears calm and comfortable.     The results of significant diagnostics from this hospitalization (including imaging, microbiology, ancillary and laboratory) are listed below for reference.     Procedures and Diagnostic Studies:   DG Chest Portable 1 View  Result Date:  05/09/2020 CLINICAL DATA:  Syncopal episode at dialysis. EXAM: PORTABLE CHEST 1 VIEW COMPARISON:  05/04/2020 FINDINGS: Right Port-A-Cath tip at low SVC. Midline trachea. Moderate cardiomegaly. No right and no definite left pleural effusion. No pneumothorax. No congestive failure. Left lung base poorly evaluated secondary to AP portable technique and cardiomegaly. Otherwise, the lungs are clear. IMPRESSION: Cardiomegaly without congestive failure. Suboptimal evaluation of the left lung base and left pleural space. Electronically Signed   By: Abigail Miyamoto M.D.   On: 05/09/2020 10:49     Labs:   Basic Metabolic Panel: Recent Labs  Lab 05/04/20 1653 05/04/20 1653 05/04/20 1729 05/04/20 1729 05/06/20 0334 05/06/20 0334 05/09/20 0824 05/09/20 1555 05/10/20 0813  NA 132*  --  132*  --  133*  --  130*  --  125*  K 4.1   < > 4.1   < > 4.6   < > 4.3  --  4.5  CL 93*  --  93*  --  93*  --  90*  --  87*  CO2 25  --   --   --  24  --  26  --  21*  GLUCOSE 111*  --  111*  --  108*  --  93  --  99  BUN 15  --  14  --  25*  --  29*  --  38*  CREATININE 4.38*   < > 4.40*  --  6.66*  --  7.64* 7.98* 8.53*  CALCIUM 8.9  --   --   --  9.8  --  9.6  --  9.6  MG  --   --   --   --   --   --   --  2.3  --   PHOS  --   --   --   --   --   --   --  6.0*  --    < > = values in this interval not displayed.   GFR Estimated Creatinine Clearance: 7.4 mL/min (A) (by C-G formula based on SCr of 8.53 mg/dL (H)). Liver Function Tests: Recent Labs  Lab 05/04/20 1653 05/06/20 0334 05/09/20 0824 05/10/20 0813  AST _0 ALT _1 ALKPHOS 54 56 56 51  BILITOT 0.9 0.8 0.9 0.9  PROT 5.8* 5.9* 5.6* 5.3*  ALBUMIN 2.8* 2.8* 2.9* 2.6*   No results for input(s): LIPASE, AMYLASE in the last 168 hours. No results for input(s): AMMONIA in the last 168 hours. Coagulation profile No results for input(s): INR, PROTIME in the last 168 hours.  CBC: Recent Labs  Lab 05/04/20 1653 05/04/20 1653  05/04/20 1729 05/06/20 0334 05/09/20 0824 05/09/20 1555 05/10/20 0813  WBC 7.0  --   --  8.1 6.4 6.4 6.2  NEUTROABS 5.9  --   --  6.4 5.1  --   --   HGB 7.2*   < > 7.8* 7.3* 7.6* 7.6* 7.9*  HCT 24.6*   < > 23.0* 25.1* 25.3* 25.0* 25.5*  MCV 107.0*  --   --  109.1* 109.1* 108.2* 106.7*  PLT 165  --   --  173 121* 119* 165   < > = values in this interval not displayed.   Cardiac Enzymes: No results for input(s): CKTOTAL, CKMB, CKMBINDEX, TROPONINI in the last 168 hours. BNP: Invalid input(s): POCBNP CBG: Recent Labs  Lab 05/06/20 0420 05/09/20 1653 05/09/20 2143 05/10/20 0646  GLUCAP 99 95 83 79   D-Dimer No results for input(s): DDIMER in the last 72 hours. Hgb A1c Recent Labs    05/09/20 1319  HGBA1C 4.4*   Lipid Profile No results for input(s): CHOL, HDL, LDLCALC, TRIG, CHOLHDL, LDLDIRECT in the last 72 hours. Thyroid function studies No results for input(s): TSH, T4TOTAL, T3FREE, THYROIDAB in the last 72 hours.  Invalid input(s): FREET3 Anemia work up No results for input(s): VITAMINB12, FOLATE, FERRITIN, TIBC, IRON, RETICCTPCT in the last 72 hours. Microbiology Recent Results (from the past 240 hour(s))  SARS Coronavirus 2 by RT PCR (hospital order, performed in [facility] hospital lab) Nasopharyngeal Nasopharyngeal Swab     Status: None   Collection Time: 05/01/20 12:33 PM   Specimen: Nasopharyngeal Swab  Result Value Ref Range Status   SARS Coronavirus 2 NEGATIVE NEGATIVE Final    Comment: (NOTE) SARS-CoV-2 target nucleic acids are NOT DETECTED.  The SARS-CoV-2 RNA is generally detectable in upper and lower respiratory specimens during the acute phase of infection. The lowest concentration of SARS-CoV-2 viral copies this assay can detect is 250 copies / mL. A negative result does not preclude SARS-CoV-2 infection and should not be used as the sole basis for treatment or other patient management decisions.  A negative result may occur with improper  specimen collection / handling, submission of specimen other than nasopharyngeal swab, presence of viral mutation(s) within the areas targeted by this assay, and inadequate number of viral copies (<250 copies / mL). A negative result must be combined with clinical observations, patient history, and epidemiological information.  Fact Sheet for Patients:   StrictlyIdeas.no  Fact Sheet for Healthcare Providers: BankingDealers.co.za  This test is not yet approved or  cleared by the [place] FDA and has been authorized for detection and/or diagnosis of SARS-CoV-2 by FDA under an Emergency Use Authorization (EUA).  This EUA will remain in effect (meaning this test can be used) for the duration of the COVID-19 declaration under Section 564(b)(1) of the Act, 21 U.S.C. section 360bbb-3(b)(1), unless the authorization is terminated or revoked sooner.  Performed at [facility], [place] [address] [place], [place] [address]   SARS Coronavirus 2 by RT PCR (hospital order, performed in [facility] hospital lab) Nasopharyngeal Nasopharyngeal Swab     Status: None   Collection Time: 05/09/20  8:45 AM   Specimen: Nasopharyngeal Swab  Result Value Ref Range Status   SARS Coronavirus 2 NEGATIVE NEGATIVE Final    Comment: (NOTE) SARS-CoV-2 target nucleic acids are NOT DETECTED.  The SARS-CoV-2 RNA is generally detectable in upper and lower respiratory specimens during the acute phase of infection. The lowest concentration of SARS-CoV-2 viral copies this assay can detect is 250 copies / mL. A negative result does not preclude SARS-CoV-2 infection and should not be used as the sole basis for treatment or other patient management decisions.  A negative result may occur with improper specimen collection / handling, submission of specimen other than nasopharyngeal swab, presence of viral mutation(s) within the areas targeted by this assay, and  inadequate number of viral copies (<250 copies / mL). A negative result must be combined with clinical observations, patient history, and epidemiological information.  Fact Sheet for Patients:   StrictlyIdeas.no  Fact Sheet for Healthcare Providers: BankingDealers.co.za  This test is not yet approved or  cleared by the [place] FDA and has been authorized for detection and/or diagnosis of SARS-CoV-2 by FDA under an Emergency Use Authorization (EUA).  This EUA will remain in effect (meaning this test can be used) for the duration of the COVID-19 declaration under Section 564(b)(1) of the Act, 21 U.S.C. section 360bbb-3(b)(1), unless the authorization is terminated or revoked sooner.  Performed at [facility], [place] [address] [place], [place] [address]      Discharge Instructions:   Discharge Instructions    Diet general   Complete by: As directed      Allergies as of 05/10/2020      Reactions   Amoxicillin Other (See Comments)   Tolerates Unasyn. Can't move Has patient had a PCN reaction causing immediate rash, facial/tongue/throat swelling, SOB or lightheadedness with hypotension: No Has patient had a PCN reaction causing severe rash involving mucus membranes or skin necrosis: No Has patient had a PCN reaction that required hospitalization No Has patient had a PCN reaction occurring within the last 10 years: No If all of the above answers are "NO", then may proceed with Cephalosporin use.   Penicillins Other (See Comments)   Tolerates Unasyn. Can't move/dizziness Has patient had a PCN reaction causing immediate rash, facial/tongue/throat swelling, SOB or lightheadedness with hypotension: No Has patient had a PCN reaction causing severe rash involving mucus membranes or skin necrosis: No Has patient had a PCN reaction that required hospitalization No Has patient had a PCN reaction occurring within the last 10 years:  No If all of the above answers are "NO", then may proceed with Cephalosporin use.      Medication List    STOP taking these medications   amLODipine 5 MG tablet Commonly known as: NORVASC   cetirizine 10 MG tablet Commonly known as: ZYRTEC   Darbepoetin Alfa 200 MCG/0.4ML Sosy injection Commonly known as: ARANESP   HYDROcodone-acetaminophen 5-325 MG tablet Commonly known as: NORCO/VICODIN   ixazomib citrate 3 MG capsule Commonly known as: Ninlaro   OneTouch Ultra test strip Generic drug: glucose blood     TAKE these medications   atorvastatin 40 MG tablet Commonly known as: LIPITOR Take 40 mg by mouth every evening.   Auryxia 1 GM 210 MG(Fe) tablet Generic drug: ferric citrate Take 420 mg by mouth 3 (three) times daily.   melatonin 3 MG Tabs tablet Take 1 tablet (3 mg total) by mouth at bedtime.   ondansetron 4 MG disintegrating tablet Commonly known as: ZOFRAN-ODT Take 4 mg by mouth 2 (two) times daily as needed for nausea or vomiting.   oxyCODONE 5 MG immediate release tablet Commonly known as: Oxy IR/ROXICODONE Take 1 tablet (5 mg total) by mouth every 6 (six) hours as needed for moderate pain (or dyspnea).   OXYGEN Place 3 L into the nose.   senna-docusate 8.6-50 MG tablet Commonly known as: Senokot-S Take 1 tablet by mouth at bedtime as needed for mild constipation.   tamsulosin 0.4 MG Caps capsule Commonly known as: FLOMAX Take 1 capsule (0.4 mg total) by mouth daily.         Time coordinating discharge: 35 min  Signed:  Geradine Girt DO  [facility] 05/10/2020, 10:57 AM

## 2020-05-10 NOTE — Progress Notes (Signed)
Daily Progress Note   Patient Name: [name] [name]       Date: 05/10/2020 DOB: May 01, 1946  Age: 74 y.o. MRN#: [id number] Attending Physician: Geradine Girt, DO Primary Care Physician: [name], DO Admit Date: 05/09/2020    Subjective: Patient is sitting up on the side of the bed. Lunch tray is in front of him; he reports decreased appetite. He also reports anxiety. He expresses sadness about the "bad news" he received yesterday, but states he is doing ok and in good spirits. He states he is "at peace".    Patient expresses disappointment that he has not received any blood, stating it helps him to feel better and "have more energy". I explained the concern that a unit of blood will cause volume overload. We discussed no further lab draws while he is in the hospital.   Length of Stay: 1  Current Medications: Scheduled Meds:  . atorvastatin  40 mg Oral QPM  . Chlorhexidine Gluconate Cloth  6 each Topical Daily  . ferric citrate  420 mg Oral TID  . heparin  5,000 Units Subcutaneous Q8H  . insulin aspart  0-5 Units Subcutaneous QHS  . insulin aspart  0-6 Units Subcutaneous TID WC  . mouth rinse  15 mL Mouth Rinse BID  . melatonin  3 mg Oral QHS  . sodium chloride flush  3 mL Intravenous Q12H  . tamsulosin  0.4 mg Oral Daily     PRN Meds: acetaminophen **OR** acetaminophen, oxyCODONE, senna-docusate  Physical Exam Vitals reviewed.  Constitutional:      General: He is not in acute distress.    Comments: Chronically ill-appearing  HENT:     Head: Normocephalic and atraumatic.  Pulmonary:     Effort: Pulmonary effort is normal.  Neurological:     Mental Status: He is alert and oriented to person, place, and time.     Motor: Weakness present.  Psychiatric:        Mood  and Affect: Mood is anxious and depressed.             Vital Signs: BP 112/73 (BP Location: Right Arm)   Pulse 79   Temp 98 F (36.7 C) (Oral)   Resp 18   Ht 5' 9" (1.753 m)   Wt 81 kg   SpO2 100%  BMI 26.38 kg/m  SpO2: SpO2: 100 % O2 Device: O2 Device: Nasal Cannula O2 Flow Rate: O2 Flow Rate (L/min): 4 L/min  Intake/output summary:   Intake/Output Summary (Last 24 hours) at 05/10/2020 1214 Last data filed at 05/10/2020 0910 Gross per 24 hour  Intake 320 ml  Output 0 ml  Net 320 ml   LBM: Last BM Date: 05/08/20 Baseline Weight: Weight: 81 kg Most recent weight: Weight: 81 kg       Palliative Assessment/Data: PPS=30%      Palliative Care Assessment & Plan   HPI/Patient Profile: 74 y.o. male  with multiple co-morbidities presenting to the emergency department on 05/09/2020 from the dialysis center with complaint of syncope. Per intake H&P, he was being transferred to his wheelchair to start his HD treatment and he blacked out for 30 seconds. Of note, patient was recently admitted for similar symptoms. His work-up including transthoracic echo and carotid duplex ultrasound. Cardiology recommended TED hose and hold Norvasc on hemodialysis days, there was concern for recurrent syncope likely secondary to fluid shifts postdialysis versus vasovagal episodes. ED course: Patient's vital signs stable, no leukocytosis, CBC shows hgb-7.6, MCV-109, platelets-121, sodium-130. COVID negative. Chest x-ray shows cardiomegaly without congestive failure. Patient was admitted for recurrent syncope. Per nephrology, continued dialysis is not safe due to recurrent syncopal episodes and near-arrests at the outpatient HD unit--no further dialysis.   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) - Multiple Myeloma (light chain), progressive - Renal cell carcinoma - anemia due to CKD, myeloma, RCC - paroxysmal a-fib - severe pulmonary hypertension - CAD,  medically managed - COPD  Recommendations/Plan: - DNR (gold form on chart) - transfer to [facility]  - continue oxycodone IR every 6 hours as needed for pain - ativan 0.5 mg every 4 hours as needed for anxiety  Goals of Care and Additional Recommendations: Limitations on Scope of Treatment: No Hemodialysis and No Lab Draws  Code Status: DNR/DNI  Prognosis:  < 2 weeks  Discharge Planning: Hospice facility  Care plan was discussed with nursing  Thank you for allowing the Palliative Medicine Team to assist in the care of this patient.   Total Time 25 minutes Prolonged Time Billed  no       Greater than 50%  of this time was spent counseling and coordinating care related to the above assessment and plan.  Lavena Bullion, NP  Please contact Palliative Medicine Team phone at [phone] for questions and concerns.

## 2020-05-10 NOTE — Progress Notes (Signed)
Progress Note    GLADE STRAUSSER  ZES:[id number] DOB: 1946-03-31  DOA: 05/09/2020 PCP: [name], DO    Brief Narrative:    Medical records reviewed and are as summarized below:  Kyandre A Dawes is an 74 y.o. 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 fib, chronic anemia, ascending aortic aneurysm, renal cell carcinoma, type 2 diabetes mellitus, hyponatremia, chronic diastolic CHF, hypertension, hyperlipidemia presents to emergency department for evaluation of syncope.  Patient was deemed unsafe for continued dialysis due to recurrent syncopal episodes.  Plan is to transition to hospice care.  Assessment/Plan:   Principal Problem:   Syncope Active Problems:   Essential (primary) hypertension   BPH (benign prostatic hyperplasia)   Diabetes (HCC)   Paroxysmal atrial fibrillation (HCC)   Light chain myeloma (HCC)   CAD (coronary artery disease)   Chronic diastolic CHF (congestive heart failure) (HCC)   Hyperlipidemia   ESRD (end stage renal disease) ([place])   Hyponatremia   Encounter for hospice care discussion  1. 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 Multiple myeloma - untreated, progressive 3. Anemia d/t myeloma/ esrd 4. ESRD -patient is not a candidate for further dialysis 5. Renal mass - +PET c/w renal cell cancer, untreated  Patient and family met with palliative care and plans are to transition to [facility] and stop hemodialysis.     Family Communication/Anticipated D/C date and plan/Code Status    Code Status: DNR Disposition Plan: Status is: Inpatient  Remains inpatient appropriate because:Unsafe d/c plan   Dispo: The patient is from: SNF              Anticipated d/c is to: [facility]              Anticipated d/c date is: 1  day              Patient currently is medically stable to d/c.         Medical Consultants:   Renal Palliative care    Subjective:   No current complaints  Objective:    Vitals:   05/09/20 1651 05/09/20 2055 05/10/20 0603 05/10/20 0809  BP: 117/84 117/81 125/88 112/73  Pulse: 72 73 69 79  Resp: 18 15 18 18   Temp: (!) 97.5 F (36.4 C) 97.7 F (36.5 C) 97.8 F (36.6 C) 98 F (36.7 C)  TempSrc: Oral Oral Oral Oral  SpO2: 98% 100% 98% 100%  Weight:  81 kg    Height:        Intake/Output Summary (Last 24 hours) at 05/10/2020 0949 Last data filed at 05/10/2020 0200 Gross per 24 hour  Intake 200 ml  Output 0 ml  Net 200 ml   Filed Weights   05/09/20 0836 05/09/20 2055  Weight: 81 kg 81 kg    Exam:  General: Appearance:     Frail male in no acute distress     Lungs:     respirations unlabored  Heart:    Normal heart rate.      Neurologic:   Awake, alert. No apparent focal neurological defect.     Data Reviewed:   I have personally reviewed following labs and imaging studies:  Labs: Labs show the following:   Basic Metabolic Panel: Recent Labs  Lab 05/04/20 1653 05/04/20 1653 05/04/20  1729 05/04/20 1729 05/06/20 0334 05/09/20 0824 05/09/20 1555  NA 132*  --  132*  --  133* 130*  --   K 4.1   < > 4.1   < > 4.6 4.3  --   CL 93*  --  93*  --  93* 90*  --   CO2 25  --   --   --  24 26  --   GLUCOSE 111*  --  111*  --  108* 93  --   BUN 15  --  14  --  25* 29*  --   CREATININE 4.38*  --  4.40*  --  6.66* 7.64* 7.98*  CALCIUM 8.9  --   --   --  9.8 9.6  --   MG  --   --   --   --   --   --  2.3  PHOS  --   --   --   --   --   --  6.0*   < > = values in this interval not displayed.   GFR Estimated Creatinine Clearance: 7.9 mL/min (A) (by C-G formula based on SCr of 7.98 mg/dL (H)). Liver Function Tests: Recent Labs  Lab 05/04/20 1653 05/06/20 0334 05/09/20 0824  AST 25 26 28   ALT 7 8 8   ALKPHOS 54 56 56  BILITOT 0.9 0.8 0.9  PROT 5.8*  5.9* 5.6*  ALBUMIN 2.8* 2.8* 2.9*   No results for input(s): LIPASE, AMYLASE in the last 168 hours. No results for input(s): AMMONIA in the last 168 hours. Coagulation profile No results for input(s): INR, PROTIME in the last 168 hours.  CBC: Recent Labs  Lab 05/04/20 1653 05/04/20 1653 05/04/20 1729 05/06/20 0334 05/09/20 0824 05/09/20 1555 05/10/20 0813  WBC 7.0  --   --  8.1 6.4 6.4 6.2  NEUTROABS 5.9  --   --  6.4 5.1  --   --   HGB 7.2*   < > 7.8* 7.3* 7.6* 7.6* 7.9*  HCT 24.6*   < > 23.0* 25.1* 25.3* 25.0* 25.5*  MCV 107.0*  --   --  109.1* 109.1* 108.2* 106.7*  PLT 165  --   --  173 121* 119* 165   < > = values in this interval not displayed.   Cardiac Enzymes: No results for input(s): CKTOTAL, CKMB, CKMBINDEX, TROPONINI in the last 168 hours. BNP (last 3 results) No results for input(s): PROBNP in the last 8760 hours. CBG: Recent Labs  Lab 05/06/20 0420 05/09/20 1653 05/09/20 2143 05/10/20 0646  GLUCAP 99 95 83 79   D-Dimer: No results for input(s): DDIMER in the last 72 hours. Hgb A1c: Recent Labs    05/09/20 1319  HGBA1C 4.4*   Lipid Profile: No results for input(s): CHOL, HDL, LDLCALC, TRIG, CHOLHDL, LDLDIRECT in the last 72 hours. Thyroid function studies: No results for input(s): TSH, T4TOTAL, T3FREE, THYROIDAB in the last 72 hours.  Invalid input(s): FREET3 Anemia work up: No results for input(s): VITAMINB12, FOLATE, FERRITIN, TIBC, IRON, RETICCTPCT in the last 72 hours. Sepsis Labs: Recent Labs  Lab 05/06/20 0334 05/09/20 0824 05/09/20 1555 05/10/20 0813  WBC 8.1 6.4 6.4 6.2    Microbiology Recent Results (from the past 240 hour(s))  SARS Coronavirus 2 by RT PCR (hospital order, performed in [facility] hospital lab) Nasopharyngeal Nasopharyngeal Swab     Status: None   Collection Time: 05/01/20 12:33 PM   Specimen: Nasopharyngeal Swab  Result Value Ref Range  Status   SARS Coronavirus 2 NEGATIVE NEGATIVE Final    Comment:  (NOTE) SARS-CoV-2 target nucleic acids are NOT DETECTED.  The SARS-CoV-2 RNA is generally detectable in upper and lower respiratory specimens during the acute phase of infection. The lowest concentration of SARS-CoV-2 viral copies this assay can detect is 250 copies / mL. A negative result does not preclude SARS-CoV-2 infection and should not be used as the sole basis for treatment or other patient management decisions.  A negative result may occur with improper specimen collection / handling, submission of specimen other than nasopharyngeal swab, presence of viral mutation(s) within the areas targeted by this assay, and inadequate number of viral copies (<250 copies / mL). A negative result must be combined with clinical observations, patient history, and epidemiological information.  Fact Sheet for Patients:   StrictlyIdeas.no  Fact Sheet for Healthcare Providers: BankingDealers.co.za  This test is not yet approved or  cleared by the [place] FDA and has been authorized for detection and/or diagnosis of SARS-CoV-2 by FDA under an Emergency Use Authorization (EUA).  This EUA will remain in effect (meaning this test can be used) for the duration of the COVID-19 declaration under Section 564(b)(1) of the Act, 21 U.S.C. section 360bbb-3(b)(1), unless the authorization is terminated or revoked sooner.  Performed at [facility], [place] [address] [place], [place] [address]   SARS Coronavirus 2 by RT PCR (hospital order, performed in [facility] hospital lab) Nasopharyngeal Nasopharyngeal Swab     Status: None   Collection Time: 05/09/20  8:45 AM   Specimen: Nasopharyngeal Swab  Result Value Ref Range Status   SARS Coronavirus 2 NEGATIVE NEGATIVE Final    Comment: (NOTE) SARS-CoV-2 target nucleic acids are NOT DETECTED.  The SARS-CoV-2 RNA is generally detectable in upper and lower respiratory specimens during the acute phase  of infection. The lowest concentration of SARS-CoV-2 viral copies this assay can detect is 250 copies / mL. A negative result does not preclude SARS-CoV-2 infection and should not be used as the sole basis for treatment or other patient management decisions.  A negative result may occur with improper specimen collection / handling, submission of specimen other than nasopharyngeal swab, presence of viral mutation(s) within the areas targeted by this assay, and inadequate number of viral copies (<250 copies / mL). A negative result must be combined with clinical observations, patient history, and epidemiological information.  Fact Sheet for Patients:   StrictlyIdeas.no  Fact Sheet for Healthcare Providers: BankingDealers.co.za  This test is not yet approved or  cleared by the [place] FDA and has been authorized for detection and/or diagnosis of SARS-CoV-2 by FDA under an Emergency Use Authorization (EUA).  This EUA will remain in effect (meaning this test can be used) for the duration of the COVID-19 declaration under Section 564(b)(1) of the Act, 21 U.S.C. section 360bbb-3(b)(1), unless the authorization is terminated or revoked sooner.  Performed at [facility], [place] [address] [place], [place] [address]     Procedures and diagnostic studies:  DG Chest Portable 1 View  Result Date: 05/09/2020 CLINICAL DATA:  Syncopal episode at dialysis. EXAM: PORTABLE CHEST 1 VIEW COMPARISON:  05/04/2020 FINDINGS: Right Port-A-Cath tip at low SVC. Midline trachea. Moderate cardiomegaly. No right and no definite left pleural effusion. No pneumothorax. No congestive failure. Left lung base poorly evaluated secondary to AP portable technique and cardiomegaly. Otherwise, the lungs are clear. IMPRESSION: Cardiomegaly without congestive failure. Suboptimal evaluation of the left lung base and  left pleural space. Electronically Signed   By: Abigail Miyamoto M.D.   On: 05/09/2020 10:49    Medications:   . atorvastatin  40 mg Oral QPM  . Chlorhexidine Gluconate Cloth  6 each Topical Daily  . ferric citrate  420 mg Oral TID  . heparin  5,000 Units Subcutaneous Q8H  . insulin aspart  0-5 Units Subcutaneous QHS  . insulin aspart  0-6 Units Subcutaneous TID WC  . mouth rinse  15 mL Mouth Rinse BID  . melatonin  3 mg Oral QHS  . sodium chloride flush  3 mL Intravenous Q12H  . tamsulosin  0.4 mg Oral Daily   Continuous Infusions:   LOS: 1 day   Geradine Girt  [facility]   How to contact the [facility] Attending or Consulting provider [place] or covering provider during after hours [place], for this patient?  1. Check the care team in [facility] and look for a) attending/consulting [facility] provider listed and b) the [facility] team listed 2. Log into [email or web address] and use [name]'s universal password to access. If you do not have the password, please contact the hospital operator. 3. Locate the [facility] provider you are looking for under [facility] and page to a number that you can be directly reached. 4. If you still have difficulty reaching the provider, please page the [facility] (Director on Call) for the Hospitalists listed on amion for assistance.  05/10/2020, 9:49 AM

## 2020-05-12 LAB — TYPE AND SCREEN
ABO/RH(D): A POS
Antibody Screen: NEGATIVE
Unit division: 0

## 2020-05-12 LAB — BPAM RBC
Blood Product Expiration Date: 202109252359
Unit Type and Rh: 6200

## 2020-05-15 ENCOUNTER — Telehealth (INDEPENDENT_AMBULATORY_CARE_PROVIDER_SITE_OTHER): Payer: Self-pay | Admitting: Internal Medicine

## 2020-05-15 NOTE — Telephone Encounter (Signed)
PT PASSED THIS MORNING

## 2020-05-16 ENCOUNTER — Ambulatory Visit: Payer: Medicare Other

## 2020-05-16 NOTE — Telephone Encounter (Signed)
Updated chart.

## 2020-05-19 ENCOUNTER — Ambulatory Visit: Payer: Medicare Other | Admitting: General Practice

## 2020-06-02 DEATH — deceased

## 2020-08-08 ENCOUNTER — Ambulatory Visit: Payer: Medicare Other | Admitting: Cardiology
